# Patient Record
Sex: Male | Born: 1990 | Race: White | Hispanic: No | Marital: Single | Smoking: Never smoker
Health system: Southern US, Community
[De-identification: ages and names within clinical notes are randomized; demographics above are authoritative.]

## PROBLEM LIST (undated history)

## (undated) DIAGNOSIS — J690 Pneumonitis due to inhalation of food and vomit: Secondary | ICD-10-CM

## (undated) DIAGNOSIS — S73004A Unspecified dislocation of right hip, initial encounter: Secondary | ICD-10-CM

## (undated) DIAGNOSIS — M810 Age-related osteoporosis without current pathological fracture: Secondary | ICD-10-CM

## (undated) DIAGNOSIS — R001 Bradycardia, unspecified: Secondary | ICD-10-CM

## (undated) DIAGNOSIS — G40209 Localization-related (focal) (partial) symptomatic epilepsy and epileptic syndromes with complex partial seizures, not intractable, without status epilepticus: Secondary | ICD-10-CM

## (undated) DIAGNOSIS — D709 Neutropenia, unspecified: Principal | ICD-10-CM

## (undated) DIAGNOSIS — H5089 Other specified strabismus: Secondary | ICD-10-CM

## (undated) DIAGNOSIS — G8250 Quadriplegia, unspecified: Secondary | ICD-10-CM

## (undated) DIAGNOSIS — D649 Anemia, unspecified: Secondary | ICD-10-CM

## (undated) DIAGNOSIS — S73005A Unspecified dislocation of left hip, initial encounter: Secondary | ICD-10-CM

## (undated) DIAGNOSIS — Z9289 Personal history of other medical treatment: Secondary | ICD-10-CM

## (undated) DIAGNOSIS — N200 Calculus of kidney: Secondary | ICD-10-CM

## (undated) DIAGNOSIS — G4733 Obstructive sleep apnea (adult) (pediatric): Secondary | ICD-10-CM

## (undated) DIAGNOSIS — K219 Gastro-esophageal reflux disease without esophagitis: Secondary | ICD-10-CM

## (undated) DIAGNOSIS — Q539 Undescended testicle, unspecified: Secondary | ICD-10-CM

## (undated) DIAGNOSIS — J302 Other seasonal allergic rhinitis: Secondary | ICD-10-CM

## (undated) DIAGNOSIS — J189 Pneumonia, unspecified organism: Secondary | ICD-10-CM

## (undated) DIAGNOSIS — G40309 Generalized idiopathic epilepsy and epileptic syndromes, not intractable, without status epilepticus: Secondary | ICD-10-CM

## (undated) DIAGNOSIS — R1310 Dysphagia, unspecified: Secondary | ICD-10-CM

## (undated) DIAGNOSIS — G40909 Epilepsy, unspecified, not intractable, without status epilepticus: Secondary | ICD-10-CM

## (undated) DIAGNOSIS — M4140 Neuromuscular scoliosis, site unspecified: Secondary | ICD-10-CM

## (undated) DIAGNOSIS — G800 Spastic quadriplegic cerebral palsy: Secondary | ICD-10-CM

## (undated) DIAGNOSIS — H35109 Retinopathy of prematurity, unspecified, unspecified eye: Secondary | ICD-10-CM

## (undated) DIAGNOSIS — I615 Nontraumatic intracerebral hemorrhage, intraventricular: Secondary | ICD-10-CM

## (undated) DIAGNOSIS — S90821A Blister (nonthermal), right foot, initial encounter: Secondary | ICD-10-CM

## (undated) HISTORY — DX: Age-related osteoporosis without current pathological fracture: M81.0

## (undated) HISTORY — DX: Unspecified dislocation of right hip, initial encounter: S73.004A

## (undated) HISTORY — PX: INFUSION PUMP IMPLANTATION: SHX1824

## (undated) HISTORY — DX: Dysphagia, unspecified: R13.10

## (undated) HISTORY — DX: Other seasonal allergic rhinitis: J30.2

## (undated) HISTORY — DX: Generalized idiopathic epilepsy and epileptic syndromes, not intractable, without status epilepticus: G40.309

## (undated) HISTORY — DX: Retinopathy of prematurity, unspecified, unspecified eye: H35.109

## (undated) HISTORY — PX: RETINOPATHY OF PREMATURITY SURGERY: SHX2340

## (undated) HISTORY — DX: Pneumonia, unspecified organism: J18.9

## (undated) HISTORY — DX: Neutropenia, unspecified: D70.9

## (undated) HISTORY — DX: Localization-related (focal) (partial) symptomatic epilepsy and epileptic syndromes with complex partial seizures, not intractable, without status epilepticus: G40.209

## (undated) HISTORY — PX: INGUINAL HERNIA REPAIR: SUR1180

## (undated) HISTORY — PX: MOLE REMOVAL: SHX2046

## (undated) HISTORY — DX: Epilepsy, unspecified, not intractable, without status epilepticus: G40.909

## (undated) HISTORY — DX: Neuromuscular scoliosis, site unspecified: M41.40

## (undated) HISTORY — PX: BACK SURGERY: SHX140

## (undated) HISTORY — DX: Nontraumatic intracerebral hemorrhage, intraventricular: I61.5

## (undated) HISTORY — PX: JEJUNOSTOMY FEEDING TUBE: SUR737

## (undated) HISTORY — DX: Unspecified dislocation of right hip, initial encounter: S73.005A

## (undated) HISTORY — DX: Undescended testicle, unspecified: Q53.9

## (undated) HISTORY — DX: Spastic quadriplegic cerebral palsy: G80.0

## (undated) HISTORY — DX: Calculus of kidney: N20.0

## (undated) HISTORY — PX: EYE SURGERY: SHX253

## (undated) HISTORY — DX: Other specified strabismus: H50.89

---
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
## 2010-05-06 ENCOUNTER — Telehealth: Payer: Self-pay | Admitting: *Deleted

## 2010-05-06 NOTE — Telephone Encounter (Signed)
Please ask the mother if it would be difficult to bring her son in the office so I can assess his PEG status before I meet him just for the procedure. He may need an anti biotic or culture of the PEG site before the change. Route to [place].

## 2010-05-06 NOTE — Telephone Encounter (Signed)
PT'S MOM CALLED TO ASK IF THERE WAS A MD THAT WOULD/COULD CHANGE OUT HER SON'S BUTTON PEG TUBE. PT HAS CEREBRAL PALSY AND WITH HIS AGE, HE CONSIDERED AN ADULT; DR Wyline Mood USED TO CHANGE TUBE. DR Wyline Mood USED TO CHANGE THE TUBE; LAST TIME IT WAS CHANGED WAS 2010. PT WITH RECENT INFECTION AT THE SITE , NOW CLEARED UP, BUT IT IS LEAKING. DR [name], DOC OF THE DAY, AGREED TO DO THE REPLACEMENT , BUT SHE IS NOT IN THE HOSPITAL UNTIL THE WEEK BEGINNING 06/01/10. BUTTON, 18 FRENCH, 2.4CM LONG. INFORMED MOM, [name] THAT I CAN CHECK WITH THE  OTHER MDS FOR AN EARLIER APPT, BUT SHE WOULD RATHER WAIT FOR DR [name]. SCHEDULED PEG PLACEMENT 06/01/10 AT 0900AM IF OK DR BRODIE? ALSO, DO YOU WANT TO REPLACE THE BUTTON WITH THE SAME SIZE-18FR   2.4CM LONG? THANKS.

## 2010-05-07 ENCOUNTER — Encounter: Payer: Self-pay | Admitting: *Deleted

## 2010-05-07 NOTE — Telephone Encounter (Signed)
Spoke with pt's mom, [place] [employer] who will bring pt in to meet Dr [name] on 05/26/10 prior to Peg Button placement on 06/01/10.

## 2010-05-19 ENCOUNTER — Telehealth: Payer: Self-pay | Admitting: Internal Medicine

## 2010-05-19 NOTE — Telephone Encounter (Signed)
Left a message to call us for any questions re: appt or procedure.

## 2010-05-25 ENCOUNTER — Telehealth: Payer: Self-pay | Admitting: Internal Medicine

## 2010-05-25 NOTE — Telephone Encounter (Signed)
Patient's mother just wanted to let us know patient was treated for pneumonia last Tuesday.(New pt for [place] tomorrow) He received Rocephin IM x 6 days and Augmentin x 10 days. He has not had any fever since last Tues. He does cough but this is due to his "bad lungs".

## 2010-05-25 NOTE — Telephone Encounter (Signed)
Thanks for letting us know. He is on my schedule tomorrow.

## 2010-05-26 ENCOUNTER — Encounter: Payer: Self-pay | Admitting: Internal Medicine

## 2010-05-26 ENCOUNTER — Ambulatory Visit (INDEPENDENT_AMBULATORY_CARE_PROVIDER_SITE_OTHER): Payer: Medicaid Other | Admitting: Internal Medicine

## 2010-05-26 DIAGNOSIS — R633 Feeding difficulties: Secondary | ICD-10-CM

## 2010-05-26 DIAGNOSIS — K219 Gastro-esophageal reflux disease without esophagitis: Secondary | ICD-10-CM

## 2010-05-26 NOTE — Progress Notes (Signed)
Jeffery [name] [birth year] MRN [id number]     History of Present Illness:  This is a 20 year old white male with cerebral palsy and mental retardation, developmental mental delay and seizure disorder. He has been cared for by a guardian since the age of 4 months. He has had a percutaneous gastrostomy since 2000. It has been changed yearly by his pediatrician in 2006, 2007, 2008, 2009 and 2010. He has now switched to adult GI care. He is due to have his gastrostomy changed because it has been leaking. It is18, 2.4 cm Bard button gastrostomy. He had a swallowing study 5 years ago. He has occasional oral intake. Patient was treated for pneumonia in recent weeks.   Past Medical History  Diagnosis Date  . CP (cerebral palsy), spastic, quadriplegic   . Epilepsy   . Osteoporosis   . Kidney stones   . Inguinal hernia   . Undescended testes   . Seasonal allergies   . IVH (intraventricular hemorrhage)     Grade IV  . Hip dislocation, bilateral   . Pneumonia   . Seizures   . Sleep apnea   . Hx: UTI (urinary tract infection)    Past Surgical History  Procedure Date  . Excision of moles   . Peg placement     With multiple changes    reports that he has never smoked. He has never used smokeless tobacco. He reports that he does not drink alcohol or use illicit drugs. family history is not on file.  He is adopted. Allergies  Allergen Reactions  . Depakote         Review of Systems: Unable to give history.  The remainder of the 10  point ROS is negative except as outlined in H&P   Physical Exam: General appearance skeletal deformities. The patient is in a wheelchair. He does not respond to questions. Eyes- non icteric. HEENT nontraumatic, normocephalic. Mouth no lesions, tongue papillated, no cheilosis. Neck supple without adenopathy, thyroid not enlarged, no carotid bruits, no JVD. Lungs bibasilar rales. Cor normal S1 normal S2, regular rhythm , no murmur,  quiet  precordium. Abdomen soft with percutaneous gastrostomy and epigastrium. Skin around the gastrostomy is mildly inflamed with a small amount of drainage. There is no tenderness or distention. Extremities no pedal edema, extensive deformities. Skin no lesions. Neurological not performed. Psychological nonverbal. Appears to be relaxed and quiet.  Assessment and Plan:  Problem #1 difficulty feeding. Patient with a percutaneous gastrostomy for at least 5 years duration. He is due to have his gastrostomy changed. We will do that next week using 18 French, 2.4 cm Bard button.  Problem #2 developmental and mental abnormalities. As described above. Gastrostomy   05/26/2010 Jeffery [name]

## 2010-05-26 NOTE — Patient Instructions (Signed)
You have been scheduled for an endoscopy with Button PEG change. Please follow written instructions given to you at your visit today.

## 2010-06-01 ENCOUNTER — Ambulatory Visit (HOSPITAL_COMMUNITY)
Admission: RE | Admit: 2010-06-01 | Discharge: 2010-06-01 | Disposition: A | Discharge status: Home / Self Care | Payer: Medicaid Other | Source: Ambulatory Visit | Attending: Internal Medicine | Admitting: Internal Medicine

## 2010-06-01 ENCOUNTER — Encounter: Payer: Medicaid Other | Admitting: Internal Medicine

## 2010-06-01 DIAGNOSIS — Y833 Surgical operation with formation of external stoma as the cause of abnormal reaction of the patient, or of later complication, without mention of misadventure at the time of the procedure: Secondary | ICD-10-CM | POA: Insufficient documentation

## 2010-06-01 DIAGNOSIS — R633 Feeding difficulties: Secondary | ICD-10-CM

## 2010-06-01 DIAGNOSIS — G808 Other cerebral palsy: Secondary | ICD-10-CM | POA: Insufficient documentation

## 2010-06-01 DIAGNOSIS — K9429 Other complications of gastrostomy: Secondary | ICD-10-CM

## 2010-06-01 DIAGNOSIS — K9423 Gastrostomy malfunction: Secondary | ICD-10-CM | POA: Insufficient documentation

## 2010-06-02 ENCOUNTER — Telehealth: Payer: Self-pay | Admitting: Internal Medicine

## 2010-06-02 NOTE — Telephone Encounter (Signed)
I have already answered that question  

## 2010-06-02 NOTE — Telephone Encounter (Addendum)
Pharmacy is calling stating that they got a fax from patient's mother requesting endovev mini button. However, pharmacy has contacted the manufacturer and they only provide these buttons to physicians so they or any other pharmacy or home health agency would not be able to provide these for the patient. Was there indication to the mother that she would be able to place these herself when needed or was this a misunderstanding? Please advise

## 2010-06-03 NOTE — Telephone Encounter (Signed)
I have called and left messages for both the patient's mother and Kathlene November at [facility] that no button tubes are needed since Dr [name] just replaced the tube.

## 2010-06-03 NOTE — Telephone Encounter (Signed)
I have just replaced patient's PEG so there is no need to order a new one. I am not sure why it was ordered.-as per Dr [name]

## 2010-06-04 ENCOUNTER — Telehealth: Payer: Self-pay | Admitting: *Deleted

## 2010-06-04 ENCOUNTER — Telehealth: Payer: Self-pay | Admitting: Internal Medicine

## 2010-06-04 NOTE — Telephone Encounter (Signed)
Spoke with patient's mother and had Darcey Nora, RN spoke with mother. She will bring the adapter needed by for [place] to look at so we can assist her in finding a supplier.

## 2010-06-04 NOTE — Telephone Encounter (Signed)
Patient's mother came by office to let Darcey Nora, RN look at the feeding tube she need to use with patient's button that was placed on Monday. Sheri looked at the tubing that patient was given at the [facility] Endo unit (24 Fr right angle feeding set ref# [id number]). Sheri spoke with Endo unit at [facility] and it is not available to buy the tubing that is needed. The tubing only comes in the button kit the hospital gets. Patient's mother wants to button changed to a PEG tube that she can get the tubing needed for feedings. Please, advise.

## 2010-06-04 NOTE — Telephone Encounter (Signed)
I don't see why the PEG has to be changed. We offered standard PEG but she wanted the button. So we put in the 82F, 3 cm button last week. She received the tubing with it. Any other discussion, she ought to make an appointment or write me a letter.t.

## 2010-06-05 NOTE — Telephone Encounter (Signed)
LMTCB

## 2010-06-09 NOTE — Telephone Encounter (Signed)
Spoke with patient's mother. She wants Dr. [name] to know she feels it is unsafe for patient with this devise because she has only one set of tubing. His school is using the decompression tube to feed him. She has the feeding tube at home. If she needs to decompress his stomach, she cannot because the school has that tubing. She is worried he will aspirate. Scheduled a visit on 06/25/10 at 3:00 PM with Dr. [name] to discuss.

## 2010-06-09 NOTE — Telephone Encounter (Signed)
Left message for patient's mother to call me.

## 2010-06-09 NOTE — Telephone Encounter (Signed)
He does not need to be seen by me to discuss purchasing another connecting tube. Ususally the connecting tube travels with the pt. To school and back.  f  Not possible , we will prescribe another extension tube but she may have to pay for the entire PEG set since it all comes together.

## 2010-06-10 NOTE — Telephone Encounter (Signed)
[employer] and they do not sell to anyone but those with hospital accounts. Suggested I call my purchasing department. Called Misty Stanley at [facility] purchasing and she gave me a number to call [employer]- with [name] [phone]. Spoke with [employer] and gave her the ref number on the kit that was used. She will work on finding out how to get tubing and will call me back sometime today. Called and let patient's mother know we are trying to find the tubing she needs.

## 2010-06-12 NOTE — Telephone Encounter (Signed)
Spoke with Dan Europe of [employer], JPr Ren Young the rep for [employer] the part number for the bolus feeding set is [id number] [phone] and #10 come in a case at $150. Spoke with hospital purchasing and they would need a cost center number to order. Spoke with [employer] patient's mother and she is going to see if the Company she was using will be able to order the set now that we have a part number. She will call me on Tues (06/16/10) with an update.

## 2010-06-14 NOTE — Consult Note (Signed)
  Jeffery [name], Jeffery [name]                 ACCOUNT NO.:  [id number]  MEDICAL RECORD NO.:  [id number]           PATIENT TYPE:  O  LOCATION:  WLEN                         FACILITY:  [facility]  PHYSICIAN:  Hedwig Morton. [name], MD     DATE OF BIRTH:  [birth year]  DATE OF CONSULTATION: DATE OF DISCHARGE:                                CONSULTATION   NAME OF PROCEDURE:  Change of gastrostomy.  INDICATIONS:  This 20 year old white male with cerebral palsy has a percutaneous gastrostomy for nutritional support.  It has been leaking around.  The last change of the gastrostomy occurred 2 years ago.  The existing gastrostomy is a Bard 18-French 2.4 cm gastrostomy.  He is scheduled for change of the gastrostomy.  SEDATION:  Normal.  FINDINGS:  The existing gastrostomy is located in epigastrium to the left of the midline.  Skin around the gastrostomy is macerated, red and scaly.  The existing gastrostomy was removed by gentle traction and through the existing track in the abdominal wall, a new gastrostomy was placed which is a Environmental manager 20-French 3-cm long gastrostomy.  It was placed with only gentle pressure.  The position and the patency of the gastrostomy was checked by gravity feeding using water.  The balloon at the gastrostomy was insufflated with 8 cc of sterile water.  The patient tolerated the procedure well.  IMPRESSION:  Change of percutaneous gastrostomy to 20-French, 3-cm long [employer]  PLAN: 1. The patient will resume tube feedings right away. 2. Gastrostomy ought to be changed in 1 year. 3. Continue skin care and antireflux measures.     Hedwig Morton. [name], MD     DMB/MEDQ  D:  06/01/2010  T:  06/01/2010  Job:  [id number]  Electronically Signed by [name] MD on 06/14/2010 10:01:39 PM

## 2010-06-16 NOTE — Op Note (Signed)
[name], Jeffery NO.:  [id number]   MEDICAL RECORD NO.:  [id number]          PATIENT TYPE:  AMB   LOCATION:  ENDO                         FACILITY:  [facility]   PHYSICIAN:  Bunnie Pion, MD   DATE OF BIRTH:  [birth year]   DATE OF PROCEDURE:  07/18/2006  DATE OF DISCHARGE:                               OPERATIVE REPORT   PREOPERATIVE DIAGNOSIS:  Need for enteral access.   POSTOPERATIVE DIAGNOSIS:  Need for enteral access.   OPERATION PERFORMED:  Change of Bard gastrostomy tube.   DESCRIPTION OF PROCEDURE:  The procedure was done in the endoscopy  suite.  After placement of EMLA cream on the site, the previously placed  but malfunctioning Bard button was removed in the standard fashion.  It  was notable that there was impacted material within the dome of button  and this probably contributed to the failure.   The new appropriately sized button was placed using the obturator  without difficulty.  It functioned properly.  The patient tolerated the  procedure.  He was returned to the care of his mother.      Bunnie Pion, MD  Electronically Signed     TMW/MEDQ  D:  07/19/2006  T:  07/19/2006  Job:  [id number]

## 2010-06-16 NOTE — Op Note (Signed)
NAMEALIEU, FINNIGAN NO.:  [id number]   MEDICAL RECORD NO.:  [id number]          PATIENT TYPE:  AMB   LOCATION:  ENDO                         FACILITY:  [facility]   PHYSICIAN:  Bunnie Pion, MD   DATE OF BIRTH:  09/20/1990   DATE OF PROCEDURE:  06/24/2008  DATE OF DISCHARGE:  06/24/2008                               OPERATIVE REPORT   PREOPERATIVE DIAGNOSIS:  Need for enteral access.   POSTOPERATIVE DIAGNOSIS:  Need for enteral access.   OPERATION PERFORMED:  Change of Bard button.   DESCRIPTION OF PROCEDURE:  The procedure was done in the endoscopy  suite.  After local anesthesia was obtained with EMLA cream, the 18-  French 2.4 Bard button was exchanged in the standard fashion.  The  patient tolerated the procedure well.  He was released to the care of  his mother.      Bunnie Pion, MD  Electronically Signed     TMW/MEDQ  D:  06/24/2008  T:  06/25/2008  Job:  [id number]

## 2010-06-16 NOTE — Op Note (Signed)
Jeffery [name], Jeffery [name]                 ACCOUNT NO.:  [id number]   MEDICAL RECORD NO.:  [id number]          PATIENT TYPE:  AMB   LOCATION:  SDS                          FACILITY:  [facility]   PHYSICIAN:  Bunnie Pion, MD   DATE OF BIRTH:  1990/09/20   DATE OF PROCEDURE:  07/28/2007  DATE OF DISCHARGE:                               OPERATIVE REPORT   PREOPERATIVE DIAGNOSES:  1. Neurodevelopmental delay.  2. Left hip nevus.   POSTOPERATIVE DIAGNOSES:  1. Neurodevelopmental delay.  2. Left hip nevus.   OPERATION PERFORMED:  1. Change of gastrostomy tube to 18-French 2.4-cm device.  2. Excision of left hip nevus.   DESCRIPTION OF PROCEDURE:  After identifying the patient, he was placed  in supine position upon the operating room table.  When adequate level  of anesthesia had been safely obtained, the Bard button was changed in a  standard fashion.  There were no immediate complications.   Attention was now turned to the left hip and leg where the nevus of  concern was identified.  Lidocaine 1% and 4% Marcaine were injected for  local anesthesia.   The site was prepped and draped.  A lenticular incision was made around  the lesion of concern with a 1-2 mm margin.  Dissection was carried down  carefully with electrocautery.  The specimen was excised and passed off  the field for pathologic analysis.   The wound was irrigated and the site was closed with interrupted 4-0  Monocryl suture.  Dermabond was applied.  The patient was awakened in  the operating room and returned to recovery room in stable condition.      Bunnie Pion, MD  Electronically Signed     TMW/MEDQ  D:  07/28/2007  T:  07/29/2007  Job:  [id number]

## 2010-06-16 NOTE — Telephone Encounter (Signed)
Patient's mother called. Her pharmacy is able to get the bolus feeding set. Will need an order faxed to [phone]. Order faxed.

## 2010-06-16 NOTE — Op Note (Signed)
NAMEJOSHUWA, Jeffery [name] NO.:  [id number]   MEDICAL RECORD NO.:  [id number]          PATIENT TYPE:  AMB   LOCATION:  ENDO                         FACILITY:  [facility]   PHYSICIAN:  Bunnie Pion, MD   DATE OF BIRTH:  December 22, 1990   DATE OF PROCEDURE:  10/23/2007  DATE OF DISCHARGE:                               OPERATIVE REPORT   PREOPERATIVE DIAGNOSIS:  Gastrostomy tube.   POSTOPERATIVE DIAGNOSIS:  Gastrostomy tube.   OPERATION PERFORMED:  Exchange of gastrostomy tube.   ATTENDING SURGEON:  Bunnie Pion, MD   DESCRIPTION OF PROCEDURE:  The procedure was done in the endoscopy  suite.  After adequate sedation had been provided, the previously placed  Bard button was removed in the standard fashion.  This was replaced with  a similar sized button without difficulty.  The patient tolerated the  procedure.  He was turned to the care of the nurses and discharged to  home shortly thereafter.      Bunnie Pion, MD  Electronically Signed     TMW/MEDQ  D:  10/24/2007  T:  10/24/2007  Job:  [id number]

## 2010-06-19 NOTE — Op Note (Signed)
NAMEALVIS, EDGELL                 ACCOUNT NO.:  [id number]   MEDICAL RECORD NO.:  [id number]          PATIENT TYPE:  AMB   LOCATION:  ENDO                         FACILITY:  [facility]   PHYSICIAN:  Prabhakar D. Pendse, M.D.DATE OF BIRTH:  29-Apr-1990   DATE OF PROCEDURE:  03/20/2004  DATE OF DISCHARGE:                                 OPERATIVE REPORT   PREOPERATIVE DIAGNOSES:  1.  Fracture, gastrostomy button.  2.  Past history of cerebral palsy, spastic quadriplegia and epilepsy.   POSTOPERATIVE DIAGNOSES:  1.  Fracture, gastrostomy button.  2.  Past history of cerebral palsy, spastic quadriplegia and epilepsy.   OPERATION PERFORMED:  1.  Removal of fractured gastrostomy button.  2.  Placement of new Bard #18 French 2.4 cm button.   SURGEON:  Prabhakar D. Levie Heritage, M.D.   ASSISTANT:  Nurse.   ANESTHESIA:  Topical __________ cream and rectal Valium.   DESCRIPTION OF OPERATION:  Under satisfactory sedation and topical  __________ cream anesthesia the gastrostomy site was cleansed.  The  previously placed fractured gastrostomy button was removed with  manipulation.  The area was cleansed.  A new #18, 2.4 cm Bard button was  placed with manipulation and irrigated with saline.  No leakage was noted.  Appropriate dressing was applied.   Instructions were given to the mother regarding the care of the button.   The patient was discharged to be followed as an outpatient.      PDP/MEDQ  D:  03/20/2004  T:  03/21/2004  Job:  [id number]

## 2010-06-19 NOTE — Op Note (Signed)
NAMEHINTON, LUELLEN                 ACCOUNT NO.:  [id number]   MEDICAL RECORD NO.:  [id number]          PATIENT TYPE:  AMB   LOCATION:  SDS                          FACILITY:  [facility]   PHYSICIAN:  Prabhakar D. Pendse, M.D.DATE OF BIRTH:  [birth year]   DATE OF PROCEDURE:  10/09/2004  DATE OF DISCHARGE:                                 OPERATIVE REPORT   PREOPERATIVE DIAGNOSES:  1.  Multiple pigmented moles of right buttock, left shoulder and left flank.  2.  Cerebral palsy with multiple chronic problems.   POSTOPERATIVE DIAGNOSES:  1.  Multiple pigmented moles of right buttock, left shoulder and left flank.  2.  Cerebral palsy with multiple chronic problems.   OPERATION PERFORMED:  Excision of multiple pigmented moles of right buttock,  left shoulder and left flank.   SURGEON:  Prabhakar D. Levie Heritage, M.D.   ASSISTANT:  Nurse.   ANESTHESIA:  Nurse.   OPERATIVE PROCEDURE:  Under satisfactory general mask anesthesia, in right  lateral position, left shoulder and left flank regions were thoroughly  prepped and draped in the usual manner.  A circular incision was made around  the pigmented moles with a 1- to 2-mm margin.  0.25% Marcaine with  epinephrine was injected locally.  The moles were excised by sharp  dissection with full-thickness skin, the hemostasis accomplished by  electrocautery, Neosporin dressing applied.  Note that both these mole areas  were not sutured.   The patient's general condition being satisfactory, the patient was turned  towards the left lateral position.  The right buttock region was thoroughly  prepped and draped in the usual manner.  About a 3-cm-long and 1.5-cm-wide  elliptical incision was made around the pigmented mole of the right buttock;  a full-thickness segment bearing the mole with a few-millimeter margin was  excised with hemostasis accomplished, deeper layers approximated with 4-0  Vicryl, skin closed with 4-0 nylon interrupted as well as  running  interlocking sutures, occlusive dressing applied.  Throughout the procedure,  the patient's vital signs remained stable.  The patient withstood the  procedure well and was transferred to the recovery room in satisfactory  general condition.           ______________________________  Hyman Bible Levie Heritage, M.D.     PDP/MEDQ  D:  10/09/2004  T:  10/09/2004  Job:  [id number]   cc:   [facility] [address], [place], [place] [address] Koren Shiver MD

## 2010-06-19 NOTE — Op Note (Signed)
NAMEBLAYNE, FRANKIE NO.:  [id number]   MEDICAL RECORD NO.:  [id number]          PATIENT TYPE:  AMB   LOCATION:  ENDO                         FACILITY:  [facility]   PHYSICIAN:  Bunnie Pion, MD   DATE OF BIRTH:  1990/07/03   DATE OF PROCEDURE:  06/21/2006  DATE OF DISCHARGE:                               OPERATIVE REPORT   PREOPERATIVE DIAGNOSIS:  Need for enteral feeding access.   POSTOPERATIVE DIAGNOSIS:  Need for enteral feeding access.   OPERATION PERFORMED:  Change of gastrostomy tube, 18 French, 2.4 cm Bard  button.   DESCRIPTION OF PROCEDURE:  The procedure is done in the endoscopy suite.  After the patient received sedating medication, EMLA cream was placed on  the Bard button.  This was removed in the standard fashion using the  obturator.  An 55 [place], 2.4 button was replaced in the tract without  difficulty.  A bedside gravity test demonstrated good function.  The  patient tolerated the procedure and was returned to the care of his  mother.      Bunnie Pion, MD  Electronically Signed     TMW/MEDQ  D:  06/21/2006  T:  06/21/2006  Job:  [id number]

## 2010-06-19 NOTE — Discharge Summary (Signed)
Jeffery [name], PINK NO.:  [id number]   MEDICAL RECORD NO.:  [id number]          PATIENT TYPE:  INP   LOCATION:  6123                         FACILITY:  [facility]   PHYSICIAN:  Lupita Raider, M.D.   DATE OF BIRTH:  1990/02/21   DATE OF ADMISSION:  09/10/2005  DATE OF DISCHARGE:  09/12/2005                                 DISCHARGE SUMMARY   PRIMARY CARE PHYSICIAN:  Dr. Koren Shiver of [facility],  telephone number [phone].   NEUROLOGIST:  Dr. [name] of [facility].   REASON FOR HOSPITALIZATION:  Fever and coffee ground emesis from G-tube.   SIGNIFICANT FINDINGS:  This is a 20 year old white male with MRCP and a  complex medical history, admitted with fever (105) and gastrooccult positive  G-tube fluid.  Has history of gastritis and coffee ground emesis in the G-  tube, for which he takes a PPI.  Found to have right lower lobe infiltrate  on chest x-ray and a KUB full of stool.  He was started on IV ceftriaxone  for presumed streph pneumonia and grew gram positive cocci in pairs from  September 10, 2005, blood cultures - final speciation pending.  Vital signs  were stable and he was afebrile throughout admission.  Fleets enema x1 with  much stool.  On day of discharge, he was tolerating his home feeds, afebrile  and at his baseline pulse ox.   TREATMENTS:  Ceftriaxone 1 gram IV q.24 hours times a 48 hour course and a  Fleets enema x1 with multiple stools.   OPERATIONS AND PROCEDURES:  None.   FINAL DIAGNOSES:  1. Right lower lobe pneumonia - likely streph pneumo.  2. Gastroenteritis.  3. Fever - resolved.  4. MRCP.  5. Seizure disorder.  6. Osteoporosis.  7. Kidney stones.  8. Right bilateral inguinal hernias.  9. Undescended testes bilaterally.  10.Seasonal allergies.  11.Grade IV IVH.  12.Bilateral hip dislocation.   DISCHARGE MEDICATIONS AND INSTRUCTIONS:  1. Augmentin ES 607 ml b.i.d. x8 days via G-tube.  2. Topamax  100 mg via G-tube b.i.d.  3. Neurontin 600 mg via G-tube t.i.d.  4. Calcium suspension 4 cc via G-tube b.i.d.  5. Centrum vitamins 5 cc via G-tube daily.  6. Claritin 7.5 cc via G-tube daily.  7. Prevacid 30 mg via G-tube daily.  8. MiraLax 17 grams via G-tube p.r.n. constipation.  9  Baclofen pump 817.7 grams per 24 hours.  1. Botox injections q.4 months (last injection September 03, 2005).  2. Diastat 10 mg p.r.n. seizures.   PENDING RESULTS/ISSUES TO BE FOLLOWED:  September 10, 2005 blood cultures that  showed preliminary gram positive cocci in pairs.  Speciation to follow.   FOLLOWUP:  Dr. Anner Crete and Dr. [name] p.r.n.   DISCHARGE WEIGHT:  30.87 kilograms.   DISCHARGE CONDITION:  Stable.   The handwritten discharge summary was faxed to Dr. Koren Shiver.           ______________________________  Lupita Raider, M.D.     KS/MEDQ  D:  09/12/2005  T:  09/12/2005  Job:  [id number]  cc:   Koren Shiver, M.D.  Deanna Artis. [name], M.D.

## 2010-06-19 NOTE — Op Note (Signed)
Jeffery [name], Jeffery [name]                 ACCOUNT NO.:  [id number]   MEDICAL RECORD NO.:  [id number]          PATIENT TYPE:  AMB   LOCATION:  SDS                          FACILITY:  [facility]   PHYSICIAN:  Prabhakar D. Pendse, M.D.DATE OF BIRTH:  Jun 14, 1990   DATE OF PROCEDURE:  11/16/2005  DATE OF DISCHARGE:                                 OPERATIVE REPORT   PREOPERATIVE DIAGNOSES:  1. Three pigmented moles:  Right scalp, right neck, and left leg.  2. Nonfunctioning gastrostomy button.  3. History of cerebral palsy and seizure disorder.   POSTOPERATIVE DIAGNOSIS:  1. Three pigmented moles:  Right scalp, right neck, and left leg.  2. Nonfunctioning gastrostomy button.  3. History of cerebral palsy and seizure disorder.   OPERATION PERFORMED:  1. Excision of 3 moles:  Right scalp, right neck, and left leg; excision      margins 2.5 cm x 1 cm each, and a simple repair.  2. Change of gastrostomy button; placement of Bard #18, 2.4-cm stem      button.   SURGEON:  Prabhakar D. Levie Heritage, M.D.   ASSISTANT:  Nurse.   ANESTHESIA:  Nurse.   OPERATIVE PROCEDURE:  Under satisfactory general endotracheal anesthesia  with the patient in supine position, the right scalp, right neck, left leg,  and gastrostomy regions were thoroughly prepped and draped in the usual  manner.  Regarding the excision of the moles:  An elliptical incision  measuring 2.5 cm long and 1 cm in width was made around each of these moles.  The skin and subcutaneous tissue incised.  Bleeders individually clamped,  cut, and electrocoagulated.  Then 1/4% Marcaine with epinephrine was  injected locally for hemostasis as well as postop analgesia. By sharp and  blunt dissection each mole was excised; and the repairs were carried out in  the following manner:  The right scalp mole repaired with 4-0 silk  interrupted sutures; the right neck with 5-0 Monocryl subcuticular sutures;  and left leg with 5-0 nylon running interlocking  sutures.  At this time  appropriate dressings were applied to all these three areas.   The gastrostomy site was exposed; and the previously placed nonfunctioning  gastrostomy button was removed; and the new #18-French Bard button with a  2.4-cm stem was placed with manipulation.  The button was irrigated with 50  mL of saline.  There was no leakage noted.  No other abnormal problems  noted; appropriate dressing applied.  Throughout the procedure the patient's vital signs remained stable.  The  patient withstood the procedure well; and was transferred to recovery room  in satisfactory general condition.  report to Dr. for of the valve down to  the LS address medical parts revealed in three, the large the EEA the  muscular line nurse.  At this to a 8712 end of the case           ______________________________  Hyman Bible. Levie Heritage, M.D.     PDP/MEDQ  D:  11/16/2005  T:  11/17/2005  Job:  [id number]   cc:   Layne Benton  [place]  [id number]  Dr. Koren Shiver, Medical [facility] 3,

## 2010-06-25 ENCOUNTER — Ambulatory Visit: Payer: Medicaid Other | Admitting: Internal Medicine

## 2010-06-26 ENCOUNTER — Telehealth: Payer: Self-pay | Admitting: Internal Medicine

## 2010-06-26 NOTE — Telephone Encounter (Signed)
Patient's mother calling to report that the feeding tube has serous drainage around it that is irritating his skin. She has used Bactroban around the tube in the past and wonders if she can have an rx for this. Also, wanted to let Dr. [name] know that last week, the nurse at school put 10cc water in the balloon, yesterday it had 8 cc's in it. Pharmacy [facility] [phone]. Please, advise.

## 2010-06-27 NOTE — Telephone Encounter (Signed)
There is no Procedure report under Procedures, wrong sheet was scanned in. There is a dictated Procedure note from 06/03/2010 in e-chart. Please obtain that. OK to use  Topical medication by the care taker, whatever worked for her. She is not supposed to be checking how much fluid there is left  in the retention balloon. If she does it very often, she will wear out the balloon. It is never possible to recover all the fluid  From the balloon.

## 2010-06-30 ENCOUNTER — Telehealth: Payer: Self-pay | Admitting: *Deleted

## 2010-06-30 MED ORDER — MUPIROCIN CALCIUM 2 % EX CREA: TOPICAL_CREAM | CUTANEOUS | Status: AC

## 2010-06-30 NOTE — Telephone Encounter (Signed)
Reviewed

## 2010-06-30 NOTE — Telephone Encounter (Signed)
Consult note sent to be scanned into chart. Spoke with patient's mother. She states the male nurse at endo told her to check the fluid amount in the balloon weekly but she states she will stop doing this as per Dr. [name]. Bactroban rx sent as requested.

## 2010-06-30 NOTE — Telephone Encounter (Signed)
Spoke with Delice Bison at [employer] (phone [phone]) and got patient's bactroban 2 % cream covered x 1 year (there is no generic availability for the cream).

## 2010-07-06 ENCOUNTER — Ambulatory Visit: Payer: Medicaid Other | Admitting: Internal Medicine

## 2010-10-29 LAB — CBC
HCT: 48.3
Hemoglobin: 16.3 — ABNORMAL HIGH
MCHC: 33.8
MCV: 93.6
Platelets: 159
RBC: 5.17
WBC: 6.4

## 2010-10-29 LAB — BASIC METABOLIC PANEL
Calcium: 9.5
Creatinine, Ser: 0.66
Glucose, Bld: 95
Potassium: 4.1

## 2010-11-27 ENCOUNTER — Telehealth: Payer: Self-pay | Admitting: Internal Medicine

## 2010-11-27 DIAGNOSIS — K9423 Gastrostomy malfunction: Secondary | ICD-10-CM

## 2010-11-27 NOTE — Telephone Encounter (Signed)
It will have to be a Button gastrostomy 80F,3 cm long.

## 2010-11-27 NOTE — Telephone Encounter (Signed)
Spoke with [name]. She states she was lining up the black to black writing on the button to feed pt. The white disc that inside this piece that closes up the tube came out. She got it back but has to put a band aid over the flap to keep it closed. States the patient will need to have a new feeding tube placed because this will not hold up long. The tube is functioning currently but she states it will not stay on long.

## 2010-11-27 NOTE — Telephone Encounter (Signed)
Please schedule for change of gastrostomy, please call [facility] to order the replacement gastrostomy.

## 2010-11-30 NOTE — Telephone Encounter (Signed)
Jeffery [name] left a message that they are ordering the Tomma Lightning but it may not get here this week due to weather conditions in [place]. Spoke with [name] and told her we will schedule the replacement as soon as the button comes in.

## 2010-11-30 NOTE — Telephone Encounter (Signed)
Spoke with [facility] endo. She is checking on getting the Button gastrostomy 20 French 3 cm long and will call me back.

## 2010-11-30 NOTE — Telephone Encounter (Signed)
Somebody else can do it.

## 2010-11-30 NOTE — Telephone Encounter (Signed)
Dr. [name], Do you want to do this procedure on your hospital week or have someone else do this ASAP?

## 2010-12-08 NOTE — Telephone Encounter (Signed)
Spoke with Dawn at [facility] endo and button has arrived. She states next Monday or Tuesday would be good time to do procedure. Spoke with Dr. Leone Payor and he will do this exchange since he is hospital MD. Sherron Monday with Noreene Larsson at [facility] endo and scheduled patient on 12/15/10 9:00/9:30 AM. NPO after midnight. Booking number [phone]. [name] aware of date and instructions.

## 2010-12-08 NOTE — Telephone Encounter (Signed)
Note sent to Dr. Leone Payor also with appointment

## 2010-12-14 ENCOUNTER — Encounter (HOSPITAL_COMMUNITY): Payer: Self-pay

## 2010-12-15 ENCOUNTER — Encounter (HOSPITAL_COMMUNITY): Payer: Self-pay | Admitting: Internal Medicine

## 2010-12-15 ENCOUNTER — Encounter (HOSPITAL_COMMUNITY)
Admission: RE | Disposition: A | Discharge status: Home / Self Care | Payer: Self-pay | Source: Ambulatory Visit | Attending: Internal Medicine

## 2010-12-15 ENCOUNTER — Ambulatory Visit (HOSPITAL_COMMUNITY)
Admission: RE | Admit: 2010-12-15 | Discharge: 2010-12-15 | Disposition: A | Discharge status: Home / Self Care | Payer: Medicaid Other | Source: Ambulatory Visit | Attending: Internal Medicine | Admitting: Internal Medicine

## 2010-12-15 DIAGNOSIS — R633 Feeding difficulties, unspecified: Secondary | ICD-10-CM | POA: Insufficient documentation

## 2010-12-15 DIAGNOSIS — M810 Age-related osteoporosis without current pathological fracture: Secondary | ICD-10-CM | POA: Insufficient documentation

## 2010-12-15 DIAGNOSIS — Y831 Surgical operation with implant of artificial internal device as the cause of abnormal reaction of the patient, or of later complication, without mention of misadventure at the time of the procedure: Secondary | ICD-10-CM | POA: Insufficient documentation

## 2010-12-15 DIAGNOSIS — G809 Cerebral palsy, unspecified: Secondary | ICD-10-CM | POA: Insufficient documentation

## 2010-12-15 DIAGNOSIS — K9423 Gastrostomy malfunction: Secondary | ICD-10-CM | POA: Insufficient documentation

## 2010-12-15 DIAGNOSIS — G4730 Sleep apnea, unspecified: Secondary | ICD-10-CM | POA: Insufficient documentation

## 2010-12-15 HISTORY — PX: BUTTON CHANGE: SHX5423

## 2010-12-15 SURGERY — REPLACEMENT, BUTTON GASTROSTOMY DEVICE
Anesthesia: LOCAL

## 2010-12-15 NOTE — Procedures (Signed)
3 cm Button tube that was malfunctioning was removed and a new one placed without difficulty.

## 2010-12-15 NOTE — H&P (Signed)
  20 yo wm with CP here for gastrostomy change due to deterioration of tube.   Allergies  Allergen Reactions  . Depakote    Past Medical History  Diagnosis Date  . CP (cerebral palsy), spastic, quadriplegic   . Epilepsy   . Osteoporosis   . Kidney stones   . Inguinal hernia   . Undescended testes   . Seasonal allergies   . IVH (intraventricular hemorrhage)     Grade IV  . Hip dislocation, bilateral   . Pneumonia   . Seizures   . Sleep apnea   . Hx: UTI (urinary tract infection)    Past Surgical History  Procedure Date  . Excision of moles   . Peg placement     With multiple changes   History   Social History  . Marital Status: Single    Spouse Name: N/A    Number of Children: N/A  . Years of Education: N/A   Occupational History  . Student     Social History Main Topics  . Smoking status: Never Smoker   . Smokeless tobacco: Never Used  . Alcohol Use: No  . Drug Use: No  . Sexually Active: Not on file   Other Topics Concern  . Not on file   Social History Narrative  . No narrative on file   Family History  Problem Relation Age of Onset  . Adopted: Yes   BP 115/78 t 97.5 p 64 r 16  In wheel chair NAD See pre-sedation form

## 2010-12-16 ENCOUNTER — Encounter (HOSPITAL_COMMUNITY): Payer: Self-pay

## 2010-12-16 NOTE — Procedures (Signed)
Timeout performed at 1015. Tube removed and replaced by Dr. Leone Payor with low profile 44fr x 3  Cm gastrostotomy feeding tube.Pt disicharged via wc at 1025

## 2010-12-28 ENCOUNTER — Encounter (HOSPITAL_COMMUNITY): Payer: Self-pay | Admitting: Internal Medicine

## 2011-07-01 ENCOUNTER — Other Ambulatory Visit (HOSPITAL_COMMUNITY): Payer: Self-pay | Admitting: Pediatrics

## 2011-07-01 DIAGNOSIS — R569 Unspecified convulsions: Secondary | ICD-10-CM

## 2011-07-05 ENCOUNTER — Ambulatory Visit (HOSPITAL_COMMUNITY)
Admission: RE | Admit: 2011-07-05 | Discharge: 2011-07-05 | Disposition: A | Discharge status: Home / Self Care | Payer: Medicaid Other | Source: Ambulatory Visit | Attending: Pediatrics | Admitting: Pediatrics

## 2011-07-05 DIAGNOSIS — R569 Unspecified convulsions: Secondary | ICD-10-CM

## 2011-07-05 DIAGNOSIS — G40209 Localization-related (focal) (partial) symptomatic epilepsy and epileptic syndromes with complex partial seizures, not intractable, without status epilepticus: Secondary | ICD-10-CM | POA: Insufficient documentation

## 2011-07-05 DIAGNOSIS — G40309 Generalized idiopathic epilepsy and epileptic syndromes, not intractable, without status epilepticus: Secondary | ICD-10-CM | POA: Insufficient documentation

## 2011-07-05 DIAGNOSIS — G808 Other cerebral palsy: Secondary | ICD-10-CM | POA: Insufficient documentation

## 2011-07-05 DIAGNOSIS — R9401 Abnormal electroencephalogram [EEG]: Secondary | ICD-10-CM | POA: Insufficient documentation

## 2011-07-05 NOTE — Progress Notes (Signed)
OP EEG completed

## 2011-07-05 NOTE — Procedures (Signed)
EEG NUMBER:  [phone] [id number].  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  He has had 7 seizures since February 2013, partial onset with secondary generalization.  The patient has had pneumonia on 2 of the occasions when seizures took place.  The study is being done to look for the presence of seizure disorder (345.40, 345.10, 343.3).  MEDICATIONS:  Include Keppra, Neurontin, calcium, Sentra, vitamins, zinc, vitamin C, MiraLAX, Prevacid, Proventil, albuterol, Zyderm, baclofen pump, Diastat, Botox.  RECORDING TIME:  22-1/2 minutes.  DESCRIPTION OF FINDINGS:  Background activity is a monotonous 2-3 Hz, 20- 40 microvolt delta range activity that is broadly distributed throughout the entire record.  There was no focal slowing.  There was no interictal epileptiform activity in the form of spikes or sharp waves.  Photic stimulation induced a driving response at 6 and 9 Hz.  EKG showed a sinus rhythm with ventricular response of 81 beats per minute.  IMPRESSION:  Abnormal EEG on the basis of diffuse background slowing. 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  It maybe reflect also postictal state.  No seizure Activity was seen in the record.  Deanna Artis. [name], M.D.    WGN:FAOZ D:  07/05/2011 13:16:18  T:  07/05/2011 19:00:30  Job #:  [id number]

## 2011-09-13 ENCOUNTER — Telehealth: Payer: Self-pay | Admitting: Internal Medicine

## 2011-09-13 DIAGNOSIS — K9423 Gastrostomy malfunction: Secondary | ICD-10-CM

## 2011-09-13 NOTE — Telephone Encounter (Signed)
The patient's mother called and reported the button feeding tube has a broken piece on it.  She reports that there is an internal plastic piece that keeps coming out.  She reports the tube is functional to feed him, but will need to be replaced very soon.  Dr. [name] please advise

## 2011-09-13 NOTE — Telephone Encounter (Signed)
Left message for patient to call back  

## 2011-09-13 NOTE — Telephone Encounter (Signed)
The earliest I can change it is Sept 5, Thursday  between 10.00 am and 11.30 am. We need to order 96F 2.4 cm button gastrostomy

## 2011-09-14 ENCOUNTER — Other Ambulatory Visit: Payer: Self-pay | Admitting: *Deleted

## 2011-09-14 DIAGNOSIS — K9423 Gastrostomy malfunction: Secondary | ICD-10-CM

## 2011-09-14 NOTE — Telephone Encounter (Signed)
Scheduled PEG tube replacement on 10/07/11 at 10:00 AM [facility] endo Noreene Larsson) Booking number [phone]. Left a message for mother to call me.

## 2011-09-15 NOTE — Telephone Encounter (Signed)
Spoke with patient's mother and gave her date/time of procedure. NPO after midnight.

## 2011-09-15 NOTE — Telephone Encounter (Signed)
Left a message for patient's mother to call me. 

## 2011-09-16 ENCOUNTER — Telehealth: Payer: Self-pay | Admitting: *Deleted

## 2011-09-16 NOTE — Telephone Encounter (Signed)
2.5 cm button, please DB ----- Message ----- From: Daphine Deutscher, RN Sent: 09/15/2011 1:12 PM To: Hart Carwin, MD Dr. [name], Noreene Larsson from [facility] endo called about the button on this patient. The 2.4 cm is no longer available. It comes in 2.3 or 2.5 cm now. Which would you like? Maryjo Rochester notified of above recommendation.

## 2011-10-07 ENCOUNTER — Encounter (HOSPITAL_COMMUNITY)
Admission: RE | Disposition: A | Discharge status: Home / Self Care | Payer: Self-pay | Source: Ambulatory Visit | Attending: Internal Medicine

## 2011-10-07 ENCOUNTER — Ambulatory Visit (HOSPITAL_COMMUNITY)
Admission: RE | Admit: 2011-10-07 | Discharge: 2011-10-07 | Disposition: A | Discharge status: Home / Self Care | Payer: Medicaid Other | Source: Ambulatory Visit | Attending: Internal Medicine | Admitting: Internal Medicine

## 2011-10-07 DIAGNOSIS — R633 Feeding difficulties, unspecified: Secondary | ICD-10-CM

## 2011-10-07 DIAGNOSIS — G808 Other cerebral palsy: Secondary | ICD-10-CM | POA: Insufficient documentation

## 2011-10-07 DIAGNOSIS — K9423 Gastrostomy malfunction: Secondary | ICD-10-CM | POA: Insufficient documentation

## 2011-10-07 DIAGNOSIS — Y833 Surgical operation with formation of external stoma as the cause of abnormal reaction of the patient, or of later complication, without mention of misadventure at the time of the procedure: Secondary | ICD-10-CM | POA: Insufficient documentation

## 2011-10-07 DIAGNOSIS — G4730 Sleep apnea, unspecified: Secondary | ICD-10-CM | POA: Insufficient documentation

## 2011-10-07 DIAGNOSIS — M810 Age-related osteoporosis without current pathological fracture: Secondary | ICD-10-CM | POA: Insufficient documentation

## 2011-10-07 HISTORY — PX: PEG PLACEMENT: SHX5437

## 2011-10-07 SURGERY — REPLACEMENT, PEG TUBE, WITHOUT ENDOSCOPY
Anesthesia: Moderate Sedation

## 2011-10-07 NOTE — H&P (Signed)
  Jeffery [name] 09-Apr-1990 MRN [id number]        History of Present Illness:  This is a 21 yo wm with CP  With permanent PEG which needs to be changed. Last change 09/2010 21F 2.4 cm Button   Past Medical History  Diagnosis Date  . CP (cerebral palsy), spastic, quadriplegic   . Epilepsy   . Osteoporosis   . Kidney stones   . Inguinal hernia   . Undescended testes   . Seasonal allergies   . IVH (intraventricular hemorrhage)     Grade IV  . Hip dislocation, bilateral   . Pneumonia   . Seizures   . Sleep apnea   . Hx: UTI (urinary tract infection)    Past Surgical History  Procedure Date  . Excision of moles   . Peg placement     With multiple changes  . Gastrostomy tube, change / reposition 12/15/2010       . Button change 12/15/2010    Procedure: BUTTON CHANGE;  Surgeon: Jeffery Boop, MD;  Location: [facility];  Service: Endoscopy;  Laterality: N/A;    reports that he has never smoked. He has never used smokeless tobacco. He reports that he does not drink alcohol or use illicit drugs. family history is not on file.  He is adopted. Allergies  Allergen Reactions  . Divalproex Sodium         Review of Systems:  The remainder of the 10 point ROS is negative except as outlined in H&P   Physical Exam: General appearance  , in no distress. Eyes- non icteric.Marland Kitchen Mouth no lesions, tongue papillated, no cheilosis. Neck supple without adenopathy, thyroid not enlarged, . Lungs Clear to auscultation bilaterally. Cor normal S1, normal S2, regular rhythm, no murmur,  quiet precordium. Abdomen: scaphoid, active BS's Rectal:not done Extremities no pedal edema. Skin no lesions. Psychological  Non verbal.  Assessment and Plan:  21 yo WM with CP, permanent PEG, necessary for his nutritional support. Here for PEG change. 21F 2.4 cm Button   10/07/2011 Jeffery [name]

## 2011-10-07 NOTE — Discharge Instructions (Signed)
Resume previous tube feedings of Ensure 1 can tid irrigated with water Skin care, keep PEG site clean PEG will have to be changed in 1 year- it is 28F 2.5 cm button, next one should be 20 F  button

## 2011-10-07 NOTE — Op Note (Signed)
Gastrostomy change:  Reason for change: adaptor malfunction, last change was 09/2010, pt doing well on Ensure 1 can tid plus water  Existing PEG is 57F 2.4 cm button, unfortunately we have available 57F, 2.4 cm button  Procedure: no sedation Existing PEG was removed by deflating PEG balloon, obtaining 8 cc of murky fluid. Through the existing track the new button PEG was placed with help of obturator.Patency and position was checked by running 50cc on water by gravity. Balloon was inflated with 10cc of sterile saline and again the patency was checked by gravity feeding of water.

## 2011-10-08 ENCOUNTER — Encounter (HOSPITAL_COMMUNITY): Payer: Self-pay | Admitting: Internal Medicine

## 2012-04-04 ENCOUNTER — Telehealth: Payer: Self-pay | Admitting: Internal Medicine

## 2012-04-04 NOTE — Telephone Encounter (Signed)
Patient's mother Jeffery [name] called. Patient's gastrostomy tube is leaking. It is functional but leaking. Last changed 10/07/11(18 French 2.5 cm button gastrostomy). She also wanted Dr. [name] to know he has had pneumonia x 1 month. He finished his antibiotics last week. Please, advise.

## 2012-04-05 NOTE — Telephone Encounter (Signed)
I need to see were it is leaking. And decide if he needs a different size gastrosotmy. So I need to see it first and then we need to order the appropriate size for him. Last time they ordered the wrong size 76F instead of 58F.

## 2012-04-06 NOTE — Telephone Encounter (Signed)
Spoke with patient's mother. Needs afternoon OV and no Monday. Scheduled on 04/21/12 at 2:00 PM.

## 2012-04-21 ENCOUNTER — Encounter: Payer: Self-pay | Admitting: Internal Medicine

## 2012-04-21 ENCOUNTER — Ambulatory Visit (INDEPENDENT_AMBULATORY_CARE_PROVIDER_SITE_OTHER): Payer: Medicaid Other | Admitting: Internal Medicine

## 2012-04-21 VITALS — Ht 59.5 in | Wt 120.0 lb

## 2012-04-21 DIAGNOSIS — G808 Other cerebral palsy: Secondary | ICD-10-CM

## 2012-04-21 DIAGNOSIS — R633 Feeding difficulties: Secondary | ICD-10-CM

## 2012-04-21 NOTE — Progress Notes (Signed)
Jeffery [name] [birth year] MRN [id number]   History of Present Illness:  This is a 22 year old white male with cerebral palsy and seizure disorder. He is noncommunicative and non-ambulatory. He moves around in a wheelchair. Patient has a permanent percutaneous gastrostomy which is his sole source of nutrition. He has been using a Environmental manager button type tube. The last change was in September 2013 using 18 French 2.5 cm replacement gastrostomy. A prior gastrostomy was a 20 French 2.4 cm button. His adapter has been mal-functioning causing tube feedings to leak out. He had recent pneumonia. He has been followed by the Pulmonary Department at [facility]. He has had swallowing studies. He is currently on Ensure 4 cans a day. He remains in a semi-recumbent position most of the day.   Past Medical History  Diagnosis Date  . CP (cerebral palsy), spastic, quadriplegic   . Epilepsy   . Osteoporosis   . Kidney stones   . Inguinal hernia   . Undescended testes   . Seasonal allergies   . IVH (intraventricular hemorrhage)     Grade IV  . Hip dislocation, bilateral   . Pneumonia   . Seizures   . Sleep apnea   . Hx: UTI (urinary tract infection)    Past Surgical History  Procedure Laterality Date  . Excision of moles    . Peg placement      With multiple changes  . Gastrostomy tube, change / reposition  12/15/2010       . Button change  12/15/2010    Procedure: BUTTON CHANGE;  Surgeon: Iva Boop, MD;  Location: [facility];  Service: Endoscopy;  Laterality: N/A;  . Peg placement  10/07/2011    Procedure: PERCUTANEOUS ENDOSCOPIC GASTROSTOMY (PEG) REPLACEMENT;  Surgeon: Hart Carwin, MD;  Location: [facility];  Service: Endoscopy;  Laterality: N/A;  Needs 18 F 2.5 button ordered-dl    reports that he has never smoked. He has never used smokeless tobacco. He reports that he does not drink alcohol or use illicit drugs. family history is not on file. He is  adopted. Allergies  Allergen Reactions  . Adhesive (Tape)   . Depakote (Divalproex Sodium)     Causes pancreatitis         Review of Systems: Mother denies aspiration, LPR positive for constipation he has been on bowel regimen  The remainder of the 10 point ROS is negative except as outlined in H&P   Physical Exam: General appearance  Well developed, in no distress. He formed upper body. Non communicative  Eyes- non icteric. HEENT normal verbal Mouth no lesions, tongue papillated, no cheilosis. Neck supple without adenopathy, thyroid not enlarged, no carotid bruits, no JVD. Lungs Clear to auscultation bilaterally. Coughs periodically Cor normal S1, normal S2, regular rhythm, no murmur,  quiet precordium. Abdomen: Protuberant with normoactive bowel sounds, soft and nontender. Button gastrostomy in left upper quadrant, surrounding skin appears normal. There is no leakage from around the tube. The adapter is malfunctioning; feedings seep out when it opens and to a certain degree when it is closed as well. Rectal: Not done. Extremities no pedal edema. Lower extremity contractures. Skin no lesions. Neurological not oriented x 3, Nonverbal. Psychological normal mood and affect.  Assessment and Plan:  Problem #70 22 year old white male with cerebral palsy who is dependent on tube feedings. His gastrostomy has been malfunctioning. His family desires the same type gastrostomy for replacement. We will order a 20 or 22 [place] button gastrostomy, 2.4  or 2.5 cm long and schedule replacement. He will continue on antireflux measures and current tube feedings.   04/21/2012 [name]

## 2012-04-21 NOTE — Patient Instructions (Addendum)
You have been scheduled for a PEG change (to a 20 french, 2.5 cm button) @ [facility] on 06/05/12 (Monday) @ 8:30 am. Please arrive at outpatient registration (1st floor of hospital) no later than 8:00 am for your appointment.   CC: Dr Lovell Sheehan

## 2012-04-26 DIAGNOSIS — G40319 Generalized idiopathic epilepsy and epileptic syndromes, intractable, without status epilepticus: Secondary | ICD-10-CM | POA: Insufficient documentation

## 2012-04-26 DIAGNOSIS — G808 Other cerebral palsy: Secondary | ICD-10-CM | POA: Insufficient documentation

## 2012-04-26 DIAGNOSIS — Z8679 Personal history of other diseases of the circulatory system: Secondary | ICD-10-CM | POA: Insufficient documentation

## 2012-04-26 DIAGNOSIS — G40219 Localization-related (focal) (partial) symptomatic epilepsy and epileptic syndromes with complex partial seizures, intractable, without status epilepticus: Secondary | ICD-10-CM | POA: Insufficient documentation

## 2012-05-04 ENCOUNTER — Ambulatory Visit (INDEPENDENT_AMBULATORY_CARE_PROVIDER_SITE_OTHER): Payer: Medicaid Other | Admitting: Pediatrics

## 2012-05-04 DIAGNOSIS — G808 Other cerebral palsy: Secondary | ICD-10-CM

## 2012-05-04 DIAGNOSIS — G40319 Generalized idiopathic epilepsy and epileptic syndromes, intractable, without status epilepticus: Secondary | ICD-10-CM

## 2012-05-04 DIAGNOSIS — G40219 Localization-related (focal) (partial) symptomatic epilepsy and epileptic syndromes with complex partial seizures, intractable, without status epilepticus: Secondary | ICD-10-CM

## 2012-05-04 NOTE — Patient Instructions (Addendum)
It was good to see you today.  Call me to let me know if the increased dose has helped.  We can see him back sooner if it hasn't, to increase the dose again.

## 2012-05-05 ENCOUNTER — Encounter: Payer: Self-pay | Admitting: Pediatrics

## 2012-05-05 MED ORDER — BACLOFEN 40000 MCG/20ML IT SOLN: 40000.0000 ug | Freq: Once | INTRATHECAL | Status: DC
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
## 2012-05-08 ENCOUNTER — Telehealth: Payer: Self-pay | Admitting: Internal Medicine

## 2012-05-08 NOTE — Telephone Encounter (Signed)
Spoke with patient's caregiver and gave her the date and time of procedure.

## 2012-05-26 ENCOUNTER — Telehealth: Payer: Self-pay | Admitting: *Deleted

## 2012-05-26 NOTE — Telephone Encounter (Signed)
Jeffery [name] is calling to let Dr. [name] know that Jeffery [name] is doing much better since the increase a few weeks ago.  She is not expecting a return call, just wanted to let Dr. [name] know the increase worked.

## 2012-05-26 NOTE — Telephone Encounter (Signed)
Noted thank you

## 2012-05-29 ENCOUNTER — Telehealth: Payer: Self-pay | Admitting: Family

## 2012-05-29 ENCOUNTER — Telehealth: Payer: Self-pay | Admitting: *Deleted

## 2012-05-29 NOTE — Telephone Encounter (Signed)
I spoke with the mother for 1-1/2 minutes.  I don't think he has arthritis.  I think that his main problem is spasticity in that he is not used to using AFOs.  She can take him to his primary doctor, but I don't think that the answer will be different.  I don't think that increasing baclofen is going to help very much.  We will assess him when he returns.

## 2012-05-29 NOTE — Telephone Encounter (Signed)
Per Noreene Larsson @ [facility], patient's PEG- 20 French 2.5 cm tube is in endoscopy and is ready for use.

## 2012-05-29 NOTE — Telephone Encounter (Signed)
Mom [name] called to say that [name]'s clonus is better but the braces on ankles has been bothering him. He has no redness or skin breakdown. 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 Mom asked if increased Baclofen help or should he see his PCP to investigate arthritis at knees and ankles? Call Ward Givens @ [phone]

## 2012-06-04 NOTE — H&P (Signed)
  GLADYS GUTMAN 1990-08-18 MRN [id number]        History of Present Illness:  This is a 22 yo WM with CP and seizure disorder, admitted for outpatient change og PEG due to malfunction. Pt seen in the office on3/21/2014 where his PEG adaptor appeared to be leaking. Last PEG change  Was  In 10/2011 with 62F 22.5 cm button PEG. Prior PEG was 20 F. Pt is non communicative. He is dependent on his PEG for nutritional support.   Past Medical History  Diagnosis Date  . CP (cerebral palsy), spastic, quadriplegic   . Epilepsy   . Osteoporosis   . Kidney stones   . Inguinal hernia   . Undescended testes   . Seasonal allergies   . IVH (intraventricular hemorrhage)     Grade IV  . Hip dislocation, bilateral   . Pneumonia   . Seizures   . Sleep apnea   . Hx: UTI (urinary tract infection)   . Dysphagia   . Otitis media   . Retinopathy of prematurity   . 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 Neuromuscular scoliosis   . Osteoporosis   . Complex partial seizures   . Generalized convulsive epilepsy without mention of intractable epilepsy    Past Surgical History  Procedure Laterality Date  . Excision of moles    . Peg placement      With multiple changes  . Gastrostomy tube, change / reposition  12/15/2010       . Button change  12/15/2010    Procedure: BUTTON CHANGE;  Surgeon: Iva Boop, MD;  Location: [facility];  Service: Endoscopy;  Laterality: N/A;  . Peg placement  10/07/2011    Procedure: PERCUTANEOUS ENDOSCOPIC GASTROSTOMY (PEG) REPLACEMENT;  Surgeon: Hart Carwin, MD;  Location: [facility];  Service: Endoscopy;  Laterality: N/A;  Needs 18 F 2.5 button ordered-dl  . Inguinal hernia repair Bilateral 1992  . Retinopathy of prematurity surgery  1992  . Achilles tendon lengthening  12/1998  . Soft tissue releases  wrists and fingers  12/1998  . Intrathecal baclofen pump placement  07/25/2000    reports that he has never smoked. He has never used smokeless tobacco.  He reports that he does not drink alcohol or use illicit drugs. family history is not on file. He is adopted. Allergies  Allergen Reactions  . Adhesive (Tape)   . Depakote (Divalproex Sodium)     Causes pancreatitis         Review of Systems:mother denies coughing,  The remainder of the 10 point ROS is negative except as outlined in H&P   Physical Exam: General appearance  Non verbal, in no distress. Eyes- non icteric. HEENT nontraumatic, normocephalic. Mouth no lesions, tongue papillated, no cheilosis. Neck supple without adenopathy, thyroid not enlarged, no carotid bruits, no JVD. Lungs Clear to auscultation bilaterally.deformed thorax Cor normal S1, normal S2, regular rhythm, no murmur,  quiet precordium. Abdomen: Button PEG LUQ, nl B.S's Rectal:not done Extremities no pedal edema. Skin no lesions. Neurological alert and oriented x 3. Psychological normal mood and affect.  Assessment and Plan:  22 yo WM with malfunctioning PEG, admitted to outpatient endoscopy for change of PEG, mother, who is the care giver,  prefers to continue Button gastrostomy- 19F, 2.5 cm. NPO after MN   06/04/2012 [name]

## 2012-06-05 ENCOUNTER — Encounter (HOSPITAL_COMMUNITY)
Admission: RE | Disposition: A | Discharge status: Home / Self Care | Payer: Self-pay | Source: Ambulatory Visit | Attending: Internal Medicine

## 2012-06-05 ENCOUNTER — Ambulatory Visit (HOSPITAL_COMMUNITY)
Admission: RE | Admit: 2012-06-05 | Discharge: 2012-06-05 | Disposition: A | Discharge status: Home / Self Care | Payer: Medicaid Other | Source: Ambulatory Visit | Attending: Internal Medicine | Admitting: Internal Medicine

## 2012-06-05 ENCOUNTER — Encounter (HOSPITAL_COMMUNITY): Payer: Self-pay

## 2012-06-05 DIAGNOSIS — G4730 Sleep apnea, unspecified: Secondary | ICD-10-CM | POA: Insufficient documentation

## 2012-06-05 DIAGNOSIS — G808 Other cerebral palsy: Secondary | ICD-10-CM | POA: Insufficient documentation

## 2012-06-05 DIAGNOSIS — R1310 Dysphagia, unspecified: Secondary | ICD-10-CM | POA: Insufficient documentation

## 2012-06-05 DIAGNOSIS — Y832 Surgical operation with anastomosis, bypass or graft as the cause of abnormal reaction of the patient, or of later complication, without mention of misadventure at the time of the procedure: Secondary | ICD-10-CM | POA: Insufficient documentation

## 2012-06-05 DIAGNOSIS — M810 Age-related osteoporosis without current pathological fracture: Secondary | ICD-10-CM | POA: Insufficient documentation

## 2012-06-05 DIAGNOSIS — Z87440 Personal history of urinary (tract) infections: Secondary | ICD-10-CM | POA: Insufficient documentation

## 2012-06-05 DIAGNOSIS — R633 Feeding difficulties: Secondary | ICD-10-CM

## 2012-06-05 DIAGNOSIS — G40909 Epilepsy, unspecified, not intractable, without status epilepticus: Secondary | ICD-10-CM | POA: Insufficient documentation

## 2012-06-05 DIAGNOSIS — K9423 Gastrostomy malfunction: Secondary | ICD-10-CM | POA: Insufficient documentation

## 2012-06-05 HISTORY — PX: PEG PLACEMENT: SHX5437

## 2012-06-05 SURGERY — REPLACEMENT, PEG TUBE, WITHOUT ENDOSCOPY

## 2012-06-05 NOTE — Interval H&P Note (Signed)
History and Physical Interval Note:  06/05/2012 8:56 AM  Jeffery [name]  has presented today for surgery, with the diagnosis of feeding difficulties  The various methods of treatment have been discussed with the patient and family. After consideration of risks, benefits and other options for treatment, the patient has consented to  Procedure(s) with comments: PERCUTANEOUS ENDOSCOPIC GASTROSTOMY (PEG) REPLACEMENT (N/A) - button 46fr.2.5cm as a surgical intervention .  The patient's history has been reviewed, patient examined, no change in status, stable for surgery.  I have reviewed the patient's chart and labs.  Questions were answered to the patient's satisfaction.     [name]

## 2012-06-05 NOTE — Op Note (Signed)
[facility] [address] [place], [address]   OPERATIVE PROCEDURE REPORT  PATIENT: Jeffery [name], Jeffery [name]  MR#: [id number] BIRTHDATE: [birth year]  GENDER: Male ENDOSCOPIST: Hart Carwin, MD REFERRED XB:[id number], Harvette PROCEDURE DATE:  06/05/2012 PROCEDURE: change of PEG ASA CLASS:   3 INDICATIONS: PEG malfunction, last change  Sept 2013, 65F ,2.5 cm button MEDICATIONS: [Referring Physician TOPICAL ANESTHETIC:   none  DESCRIPTION OF PROCEDURE:  A history and physical had previously been performed.  The procedure, indications, potential complications , and alternatives were explained to the patient who appeared to understand.  Opportunity for questions was provided and informed consent was obtained.  The existing PEG tube was removed using traction technique.      The G-tube insertion site was then cleansed,  and the external bolster was placed over the tube to secure it to the abdominal wall.  A sterile dressing was then applied, and the procedure terminated.   The 65F 2.5 cm button gastrostomy was removed by gentle traction, new gastrostomy 20 F 2.5 cm BS button was placed through the existing track without difficulty, patency and position was checked by running 50cc of water by gravity into the PEG tubing. Gastrostomy site appears clean,  COMPLICATIONS: There were no complications. ENDOSCOPIC IMPRESSION: PEG replacement with 56F 2.5 cm BS button RECOMMENDATIONS:  resume tube feedings- 4 cans of ensure/day continue PPI antireflux measures PEG ought to be changed within 1 year  REPEAT EXAM: 1 year   ___________________________________ eSignedHart Carwin, MD 06/05/2012 10:00 AM   CC:

## 2012-06-06 ENCOUNTER — Encounter (HOSPITAL_COMMUNITY): Payer: Self-pay | Admitting: Internal Medicine

## 2012-06-12 ENCOUNTER — Other Ambulatory Visit: Payer: Self-pay | Admitting: Family

## 2012-06-12 DIAGNOSIS — G40219 Localization-related (focal) (partial) symptomatic epilepsy and epileptic syndromes with complex partial seizures, intractable, without status epilepticus: Secondary | ICD-10-CM

## 2012-06-12 DIAGNOSIS — G40319 Generalized idiopathic epilepsy and epileptic syndromes, intractable, without status epilepticus: Secondary | ICD-10-CM

## 2012-06-12 MED ORDER — LEVETIRACETAM 100 MG/ML PO SOLN: ORAL | Status: DC

## 2012-06-14 ENCOUNTER — Encounter: Payer: Self-pay | Admitting: Neurology

## 2012-06-14 ENCOUNTER — Ambulatory Visit (INDEPENDENT_AMBULATORY_CARE_PROVIDER_SITE_OTHER): Payer: Medicaid Other | Admitting: Neurology

## 2012-06-14 DIAGNOSIS — G808 Other cerebral palsy: Secondary | ICD-10-CM

## 2012-06-14 DIAGNOSIS — G40909 Epilepsy, unspecified, not intractable, without status epilepticus: Secondary | ICD-10-CM | POA: Insufficient documentation

## 2012-06-14 DIAGNOSIS — H6690 Otitis media, unspecified, unspecified ear: Secondary | ICD-10-CM | POA: Insufficient documentation

## 2012-06-14 DIAGNOSIS — H5089 Other specified strabismus: Secondary | ICD-10-CM

## 2012-06-14 DIAGNOSIS — Z87440 Personal history of urinary (tract) infections: Secondary | ICD-10-CM

## 2012-06-14 DIAGNOSIS — G40309 Generalized idiopathic epilepsy and epileptic syndromes, not intractable, without status epilepticus: Secondary | ICD-10-CM | POA: Insufficient documentation

## 2012-06-14 DIAGNOSIS — H35109 Retinopathy of prematurity, unspecified, unspecified eye: Secondary | ICD-10-CM | POA: Insufficient documentation

## 2012-06-14 DIAGNOSIS — R1310 Dysphagia, unspecified: Secondary | ICD-10-CM | POA: Insufficient documentation

## 2012-06-14 DIAGNOSIS — M4140 Neuromuscular scoliosis, site unspecified: Secondary | ICD-10-CM | POA: Insufficient documentation

## 2012-06-14 DIAGNOSIS — G40209 Localization-related (focal) (partial) symptomatic epilepsy and epileptic syndromes with complex partial seizures, not intractable, without status epilepticus: Secondary | ICD-10-CM

## 2012-06-14 DIAGNOSIS — G8250 Quadriplegia, unspecified: Secondary | ICD-10-CM

## 2012-06-14 DIAGNOSIS — G40219 Localization-related (focal) (partial) symptomatic epilepsy and epileptic syndromes with complex partial seizures, intractable, without status epilepticus: Secondary | ICD-10-CM | POA: Insufficient documentation

## 2012-06-14 DIAGNOSIS — M810 Age-related osteoporosis without current pathological fracture: Secondary | ICD-10-CM

## 2012-06-14 MED ORDER — ONABOTULINUMTOXINA 100 UNITS IJ SOLR: 300.0000 [IU] | Freq: Once | INTRAMUSCULAR | Status: DC

## 2012-06-14 NOTE — Progress Notes (Signed)
HPI: Samarion came in for EMG guided BOTOX injection for bilateral upper extremity spasticity, accompanied by his mother.  History: Patient is a 22 -year-old with spastic quadriparesis, severe cognitive delays, and complex partial seizures with secondary generalization as a result of extreme prematurity with grade 4 intraventricular hemorrhage. His mother adopt him and his twin sister since 11 months old.  He is wheelchair bound, depend on PEG tube feeding, he can communicate some with facial expression, but non verbal. He has severe nonfunctional spastic qudraplegia, s/p baclofen bump placement at [facility] Dr [name] refills his Baclofen bump, which helps his lower extremity spasticity, it is much easier for care giver to change his diaper, other wise he has frequent bilateral lower extremity spasticity, clonus, and hip pain.  He has been receiving BOTOX A injection from [facility] Dr. Yvone Neu over past 10 years, q3-4 months, it has been very helpful too, his upper extremity is more relaxed, it is easier to put on cloth and clean his hand after injection, last injection from faxed over note was in 09/26/2009, 300 units were injected into bilateral biceps, brachioradialis, pronator teres, ECU, FDP, mother wants to change care locally for convience.  Mother reported benefit usually lasts less than 3 months, she would then noticed increased difficulty to clean his hands, he also had some improvement with his grip with BOTOX A, is attending vacation program.  UPDATE May 14th 2014: Last EMG guided Botox injection was in September 2013, he did very well, no significant side effect, he had recurrent seizure few months ago, his Keppra dosage was increased by Dr. [name],    Physical Exam    Head:   microcephaly, no dysmorphic features  Neurologic Exam  Mental Status: sits in wheelchair, mute, does not follow commands Cranial Nerves:  symmetric facial grimace.    Motor: Spasticity is  moderate in his arms and legs, and he still has problems with  bilateral fisting of his hands and wrist drop. Sensory: Bilateral lower extrmity has moderate spasticity on passive movement Coordination:  cannot test Gait and Station:  wheelchair-bound    Assessement and Plan:  23 yo cerebral palsy, severe mental retardation, EMG guided BOTOX A injection for bilateral upper extremity spasticity, he has Baclofen pump, which has helped his lower extremity spasticity.  300 units were used, 100 units were dissovled into 2 cc of NS. (lot No.[id number], exp 01/2015)  Right biceps 50 units Right brachialis 25 units right flexor digitorum superficialis 25 units right pronator teres 25 units Right flexor carpi ulnaris 25 units  Left biceps 50  units, Left brachialis 25 units Left pronator teres 25 units  Left flexor digitorum profoundi 25 units Left flexor carpi ulnaris 25 units  He tolerate the injection very well, will return to clinic in 3 months, for repeat injection.

## 2012-07-24 ENCOUNTER — Telehealth: Payer: Self-pay

## 2012-07-24 NOTE — Telephone Encounter (Signed)
Dr. Janee Morn called and would like to consult with you on this patient please. Call: [phone]

## 2012-07-24 NOTE — Telephone Encounter (Signed)
I have called his hand surgeon Dr. Janee Morn, explained previous injection pattern.

## 2012-07-28 ENCOUNTER — Other Ambulatory Visit: Payer: Self-pay

## 2012-07-28 DIAGNOSIS — G40319 Generalized idiopathic epilepsy and epileptic syndromes, intractable, without status epilepticus: Secondary | ICD-10-CM

## 2012-07-28 DIAGNOSIS — G40219 Localization-related (focal) (partial) symptomatic epilepsy and epileptic syndromes with complex partial seizures, intractable, without status epilepticus: Secondary | ICD-10-CM

## 2012-07-28 MED ORDER — DIASTAT ACUDIAL 20 MG RE GEL: RECTAL | Status: DC

## 2012-08-15 ENCOUNTER — Telehealth: Payer: Self-pay | Admitting: Family

## 2012-08-15 ENCOUNTER — Ambulatory Visit (INDEPENDENT_AMBULATORY_CARE_PROVIDER_SITE_OTHER): Payer: Medicaid Other | Admitting: Pediatrics

## 2012-08-15 ENCOUNTER — Encounter: Payer: Self-pay | Admitting: Pediatrics

## 2012-08-15 DIAGNOSIS — G808 Other cerebral palsy: Secondary | ICD-10-CM

## 2012-08-15 DIAGNOSIS — G40319 Generalized idiopathic epilepsy and epileptic syndromes, intractable, without status epilepticus: Secondary | ICD-10-CM

## 2012-08-15 DIAGNOSIS — G40219 Localization-related (focal) (partial) symptomatic epilepsy and epileptic syndromes with complex partial seizures, intractable, without status epilepticus: Secondary | ICD-10-CM

## 2012-08-15 NOTE — Telephone Encounter (Signed)
Mom [name] called to report that Jeffery [name] was experiencing increase in clonus over last several days. She said that the school called to ask if this was seizure activity because clonus was so bad. She said that also having trouble when his diaper is changed or performing care and thinks his Baclofen dose needs to be increased. Not due for pump refill until August 15th. Mom asked to be called back at [phone]. Marcelino Duster, could you see when you could work him in for reprogramming. TG

## 2012-08-15 NOTE — Patient Instructions (Addendum)
I have now given Jeffery [name] bolus doses at 4:30 AM, 8:30 AM, 11:30 AM, and 6 PM area at I also increased his overall dose by 10%.Please let me know if this helps.

## 2012-08-15 NOTE — Telephone Encounter (Signed)
Dr. [name] has a cancellation on his schedule for today at 10:45 am, I have spoken to [facility] and she is bringing Jeffery [name] in this morning at 10:30 am. MB
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
## 2012-08-24 ENCOUNTER — Encounter (HOSPITAL_COMMUNITY): Payer: Self-pay | Admitting: Pharmacy Technician

## 2012-08-25 ENCOUNTER — Telehealth (INDEPENDENT_AMBULATORY_CARE_PROVIDER_SITE_OTHER): Payer: Self-pay

## 2012-08-25 NOTE — Telephone Encounter (Signed)
Erika from pre admit called needing epic orders for patient. She said Dr Maisie Fus was doing surgery. After reviewing, there is no surgery scheduled with [place] for this pt.

## 2012-08-28 ENCOUNTER — Other Ambulatory Visit: Payer: Self-pay | Admitting: Orthopedic Surgery

## 2012-08-28 NOTE — Progress Notes (Signed)
Jamesetta Orleans, secretary called Agustin Cree at Dr. Carollee Massed office and informed her that patient needed orders for pre-admit on 08/29/12. Olegario Messier informed Jamesetta Orleans that they were not going to enter any orders in [facility] and anesthesia is aware.

## 2012-08-28 NOTE — Pre-Procedure Instructions (Signed)
Jeffery [name]  08/28/2012   Your procedure is scheduled on:  Tuesday September 05, 2012.  Report to [facility] at 5:30 AM.  Call this number if you have problems the morning of surgery: [phone]   Remember:   Do not eat food or drink liquids after midnight.   Take these medicines the morning of surgery with A SIP OF WATER: Albuterol inhaler/nebulizer if needed wheezing or shortness of breath, Gabapentin (Neurontin), Lansoprazole (Prevacid), Levetiracetam (Keppra)   Do not wear jewelry  Do not wear lotions, powders, or colognes. You may wear deodorant.  Men may shave face and neck.  Do not bring valuables to the hospital.  [facility] is not responsible for any belongings or valuables.  Contacts, dentures or bridgework may not be worn into surgery.  Leave suitcase in the car. After surgery it may be brought to your room.  For patients admitted to the hospital, checkout time is 11:00 AM the day of discharge.   Patients discharged the day of surgery will not be allowed to drive home.  Name and phone number of your driver: Family  Special Instructions: Shower using CHG 2 nights before surgery and the night before surgery.  If you shower the day of surgery use CHG.  Use special wash - you have one bottle of CHG for all showers.  You should use approximately 1/3 of the bottle for each shower.   Please read over the following fact sheets that you were given: Pain Booklet, Coughing and Deep Breathing and Surgical Site Infection Prevention

## 2012-08-28 NOTE — Progress Notes (Addendum)
Anesthesia PAT Evaluation:  Patient is a 22 year old male (goes by the name of "Jeffery [name]") scheduled for bilateral lengthening of digital flexor tendons of the hands on 09/05/12 by Dr. Mack Hook of [facility].  He is here today with his mother and friend.  History includes extreme prematurity (25 1/2 weeks) with grade IV intracranial hemorrhage with CP and spastic quadriparesis with clonus, Baclofen intrathecal pump with increase in dose 08/15/12 (basal rate 9.7 mcg/hr, 20 mcg at 4:30 AM, 8:30 AM, 11:30 AM, and 6 PM. These are infused over 15 minutes. Total daily dose is 326.0 mcg), epilepsy/complex partial seizures, OSA, nephrolithiasis, osteoporosis, strabismus s/p surgery, neuromuscular scoliosis s/p Harrington rods/fusion, dysphagia s/p gastrostomy tube/button, achilles tendon lengthening '00, frequent pneumonia, bilateral hip dysplasia, complex thumb reconstruction '00.  He is non-verbal, but can respond some with facial expressions and gestures.  Mom reports he does not typically cry.  His Baclofen pump is managed by neurologist Dr. [name].  He has also received Botox for bilateral upper extremity spasticity on 06/14/12 by Dr. Levert Feinstein. PCP has been Dr. Royston Sinner Corrington who is no longer with [facility].  He is scheduled to see Dr. Donia Guiles next month.  Mom reports she adopted him at 10 months.  She has a nursing background and is very involved in his care.  She typed out a very detailed history and medication list.  She is concerned about Jeffery [name] having general anesthesia because of his frequent lung infections.  Approximately 4 years ago he was on a ventilator for 5 days for swine flu.  He was very difficult to wean off the vent.  She says he has been treated for "pneumonia" four times already this year, last on 08/23/12. He did not have an xray to confirm PNA, but was treated with dual antibiotics for productive cough and sweating without fever.  Today his lungs are a little course/scatter  rhonchi today, but sound loose.  I think at least most his abnormal lung sounds may clear with a good cough.  Mom reports he is able to cough and clear his secretions.  He does not appear to be in any acute distress.  O2 sats were 94-95% earlier today.  Mom feels he is nearly back to baseline and will have completed the 10 day course of antibiotics prior to surgery.  He wears a pulse oximeter at night and has not had any hypoxia.  Mom reports that he had two "negative" swallow studies in the past, but only gets nutrition and meds via his G-button.  He has not had any sustained seizures requiring Diastat in eight years.  His last seizure was 3 months ago and lasted on 30-45 seconds. He can run bradycardic at times and actually required atropine when his Baclofen pump was last changed.  He had a normal cardiac work-up including EKG and echo approximately 15 years ago.  Two years ago he saw cardiologist Dr. Chales Abrahams for a 30 day Holter monitor and had no sustained episodes of bradycardia.  Of note, he does have a new closed blister on his right foot.  He is seeing a specialist at [facility] on 09/01/12.  I left a voicemail with Agustin Cree at Dr. Harold Hedge office regarding this, and mom was asked to update Dr. Carollee Massed office on Friday or Monday to ensure this would not interfere with plans for hand surgery.    CMET from 07/19/12 showed Na 135, K 4.6, glucose 98, Cr 0.6, Alk Phos 127, AST 46, ALT 43,  total protein 7.7. I had previously reviewed his history with anesthesiologist Dr. Ivin Booty and I also reviewed patient's frequent URI/PNA history with Dr. Jacklynn Bue today.  No plans for additional labs or CXR today.  Mom is very atune to [name]'s well being. She understands that he will need to be at his baseline for surgery.  If any new or worsening URI symptoms she will notify the appropriate physicians.  He will be evaluated by his assigned anesthesiologist on the day of surgery and a decision for pre-operative CXR and/or  Hemacue or ISTAT labs will be considered at that time based on findings.  Mom will give patient Keppra and Prevacid (via G-button) and Albuterol at 0430 on the day of surgery.  General anesthesia is planned.  Velna Ochs [facility]/Anesthesiology Phone [phone] 08/29/2012 11:21 AM

## 2012-08-29 ENCOUNTER — Encounter (HOSPITAL_COMMUNITY): Payer: Self-pay

## 2012-08-29 ENCOUNTER — Encounter (HOSPITAL_COMMUNITY)
Admission: RE | Admit: 2012-08-29 | Discharge: 2012-08-29 | Disposition: A | Discharge status: Home / Self Care | Payer: Medicaid Other | Source: Ambulatory Visit | Attending: Orthopedic Surgery | Admitting: Orthopedic Surgery

## 2012-08-29 HISTORY — DX: Bradycardia, unspecified: R00.1

## 2012-08-29 HISTORY — DX: Blister (nonthermal), right foot, initial encounter: S90.821A

## 2012-08-29 NOTE — Progress Notes (Signed)
Legal guardian Jeffery [name] at chairside with patient. PCP will be [name] at [facility] in [place], [place] on [address]. First appointment will be on September 11, 2012. Past PCP was Dr. Arlington Calix at [facility] also in [facility]. Other encounters in [facility]. Currently [place], [place] is seeing patient at this time.

## 2012-09-01 DIAGNOSIS — S90829A Blister (nonthermal), unspecified foot, initial encounter: Secondary | ICD-10-CM | POA: Insufficient documentation

## 2012-09-04 MED ORDER — CEFAZOLIN SODIUM-DEXTROSE 2-3 GM-% IV SOLR
2.0000 g | INTRAVENOUS | Status: DC
  Filled 2012-09-04: qty 50

## 2012-09-04 NOTE — H&P (Signed)
Jeffery [name] is an 22 y.o. male.   CC / Reason for Visit: Bilateral hand contractures HPI: This patient is a 22 year old male with cerebral palsy who presents for evaluation of bilateral hand contractures.  He is previously undergone complex thumb reconstruction is in [place], by Dr. Montel Clock.  Records indicate that the procedure was performed on 12-03-98 and included spastic thumb contracture releases with FPL transfer to the extensor mechanism, tenodesis of the distal stump, release of the abductor muscle and tendon and a local rotation flap for coverage.  It does not appear from the operative report that anything was performed for finger contracture.  Apparently, the contracture in the fingers has progressed, now making hygiene difficult.  It is said that he has some rudimentary grasp and release function with the hand.  He continues to get regular intermittent Botox injections, but the contracture in the hands progresses.  Past Medical History  Diagnosis Date  . CP (cerebral palsy), spastic, quadriplegic   . Epilepsy   . Osteoporosis   . Kidney stones   . Inguinal hernia   . Undescended testes   . Seasonal allergies   . IVH (intraventricular hemorrhage)     Grade IV  . Hip dislocation, bilateral   . Pneumonia   . Seizures   . Hx: UTI (urinary tract infection)   . Dysphagia   . Otitis media   . Retinopathy of prematurity   . 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 Neuromuscular scoliosis   . Osteoporosis   . Complex partial seizures   . Generalized convulsive epilepsy without mention of intractable epilepsy   . Epilepsy   . Sinus bradycardia     HR drops to 38-40 while sleeping  . Sleep apnea     BiPAP  . Blister of right heel     fluid filled; origin unknown    Past Surgical History  Procedure Laterality Date  . Excision of moles    . Peg placement      With multiple changes  . Gastrostomy tube, change / reposition  12/15/2010       . Button change  12/15/2010   Procedure: BUTTON CHANGE;  Surgeon: Iva Boop, MD;  Location: [facility];  Service: Endoscopy;  Laterality: N/A;  . Peg placement  10/07/2011    Procedure: PERCUTANEOUS ENDOSCOPIC GASTROSTOMY (PEG) REPLACEMENT;  Surgeon: Hart Carwin, MD;  Location: [facility];  Service: Endoscopy;  Laterality: N/A;  Needs 18 F 2.5 button ordered-dl  . Inguinal hernia repair Bilateral 1992  . Retinopathy of prematurity surgery  1992  . Achilles tendon lengthening  12/1998  . Soft tissue releases  wrists and fingers  12/1998  . Intrathecal baclofen pump placement  07/25/2000  . Peg placement N/A 06/05/2012    Procedure: PERCUTANEOUS ENDOSCOPIC GASTROSTOMY (PEG) REPLACEMENT;  Surgeon: Hart Carwin, MD;  Location: [facility];  Service: Endoscopy;  Laterality: N/A;  button 78fr.2.5cm  . Eye surgery Bilateral     Retinal   . Back surgery      Harrington Rods in back needs to be log rolled    Family History  Problem Relation Age of Onset  . Adopted: Yes   Social History:  reports that he has never smoked. He has never used smokeless tobacco. He reports that he does not drink alcohol or use illicit drugs.  Allergies:  Allergies  Allergen Reactions  . Adhesive (Tape)     Rips skin off  . Depakote (Divalproex Sodium)  Causes pancreatitis     No prescriptions prior to admission    No results found for this or any previous visit (from the past 48 hour(s)). No results found.  Review of Systems  All other systems reviewed and are negative.    There were no vitals taken for this visit. Physical Exam  Constitutional:  WD, WN, NAD HEENT:  NCAT, EOMI Neuro/Psych:  Alert & oriented to person, place, and time; appropriate mood & affect Lymphatic: No generalized UE edema or lymphadenopathy Extremities / MSK:  Both UE are normal with respect to appearance, ranges of motion, joint stability, muscle strength/tone, sensation, & perfusion except as otherwise noted:  Bilaterally there are  well-healed incisions on the volar aspect of the wrists as well as about the thumb.  Evidence of the previous soft tissue rotation flap is present.  The thumbs are actually in good position and alignment, fairly stiff, and the fingers are all clenched into the fist bilaterally.  The FDS appears tight, not the FDP.  Labs / Xrays:  No radiographic studies obtained today.  Assessment: Bilateral digital contractures in the setting of CP  Plan:  Bilateral STP transfers for digital spasticity in the setting of cerebral palsy, refractory to Botox treatment.  The details of the operative procedure were discussed with the patient and his mother.  Questions were invited and answered.  In addition to the goal of the procedure, the risks of the procedure to include but not limited to bleeding; infection; damage to the nerves or blood vessels that could result in bleeding, numbness, weakness, chronic pain, and the need for additional procedures; stiffness; the need for revision surgery; and anesthetic risks, the worst of which is death, were reviewed.  No specific outcome was guaranteed or implied.  Informed consent was obtained.     Elzina Devera A. 09/04/2012, 7:02 PM

## 2012-09-05 ENCOUNTER — Ambulatory Visit (HOSPITAL_COMMUNITY): Payer: Medicaid Other | Admitting: Certified Registered"

## 2012-09-05 ENCOUNTER — Encounter (HOSPITAL_COMMUNITY)
Admission: RE | Disposition: A | Discharge status: Home / Self Care | Payer: Self-pay | Source: Ambulatory Visit | Attending: Orthopedic Surgery

## 2012-09-05 ENCOUNTER — Encounter (HOSPITAL_COMMUNITY): Payer: Self-pay | Admitting: Vascular Surgery

## 2012-09-05 ENCOUNTER — Ambulatory Visit (HOSPITAL_COMMUNITY)
Admission: RE | Admit: 2012-09-05 | Discharge: 2012-09-05 | Disposition: A | Discharge status: Home / Self Care | Payer: Medicaid Other | Source: Ambulatory Visit | Attending: Orthopedic Surgery | Admitting: Orthopedic Surgery

## 2012-09-05 ENCOUNTER — Encounter (HOSPITAL_COMMUNITY): Payer: Self-pay | Admitting: Surgery

## 2012-09-05 ENCOUNTER — Ambulatory Visit (HOSPITAL_COMMUNITY): Payer: Medicaid Other

## 2012-09-05 DIAGNOSIS — M810 Age-related osteoporosis without current pathological fracture: Secondary | ICD-10-CM | POA: Insufficient documentation

## 2012-09-05 DIAGNOSIS — Z888 Allergy status to other drugs, medicaments and biological substances status: Secondary | ICD-10-CM | POA: Insufficient documentation

## 2012-09-05 DIAGNOSIS — J301 Allergic rhinitis due to pollen: Secondary | ICD-10-CM | POA: Insufficient documentation

## 2012-09-05 DIAGNOSIS — G808 Other cerebral palsy: Secondary | ICD-10-CM | POA: Insufficient documentation

## 2012-09-05 DIAGNOSIS — I498 Other specified cardiac arrhythmias: Secondary | ICD-10-CM | POA: Insufficient documentation

## 2012-09-05 DIAGNOSIS — H35179 Retrolental fibroplasia, unspecified eye: Secondary | ICD-10-CM | POA: Insufficient documentation

## 2012-09-05 DIAGNOSIS — G40309 Generalized idiopathic epilepsy and epileptic syndromes, not intractable, without status epilepticus: Secondary | ICD-10-CM | POA: Insufficient documentation

## 2012-09-05 DIAGNOSIS — M24549 Contracture, unspecified hand: Secondary | ICD-10-CM | POA: Insufficient documentation

## 2012-09-05 DIAGNOSIS — Z931 Gastrostomy status: Secondary | ICD-10-CM | POA: Insufficient documentation

## 2012-09-05 DIAGNOSIS — G809 Cerebral palsy, unspecified: Secondary | ICD-10-CM | POA: Insufficient documentation

## 2012-09-05 DIAGNOSIS — M6240 Contracture of muscle, unspecified site: Secondary | ICD-10-CM | POA: Insufficient documentation

## 2012-09-05 DIAGNOSIS — Z9109 Other allergy status, other than to drugs and biological substances: Secondary | ICD-10-CM | POA: Insufficient documentation

## 2012-09-05 HISTORY — PX: TENDON REPAIR: SHX5111

## 2012-09-05 LAB — POCT I-STAT 4, (NA,K, GLUC, HGB,HCT)
Glucose, Bld: 103 mg/dL — ABNORMAL HIGH (ref 70–99)
HCT: 51 % (ref 39.0–52.0)
Hemoglobin: 17.3 g/dL — ABNORMAL HIGH (ref 13.0–17.0)
Potassium: 4 mEq/L (ref 3.5–5.1)
Sodium: 142 mEq/L (ref 135–145)

## 2012-09-05 SURGERY — TENDON REPAIR
Anesthesia: General | Site: Arm Lower | Laterality: Bilateral | Wound class: Clean

## 2012-09-05 MED ORDER — MIDAZOLAM HCL 2 MG/2ML IJ SOLN: 0.5000 mg | Freq: Once | INTRAMUSCULAR | Status: DC | PRN

## 2012-09-05 MED ORDER — ACETAMINOPHEN-CODEINE 120-12 MG/5ML PO SOLN: 5.0000 mL | Freq: Four times a day (QID) | ORAL | Status: DC | PRN

## 2012-09-05 MED ORDER — ONDANSETRON HCL 4 MG/2ML IJ SOLN: 4.0000 mg | Freq: Once | INTRAMUSCULAR | Status: DC | PRN

## 2012-09-05 MED ORDER — MIDAZOLAM HCL 5 MG/5ML IJ SOLN
INTRAMUSCULAR | Status: DC | PRN
  Administered 2012-09-05 (×2): 1 mg via INTRAVENOUS

## 2012-09-05 MED ORDER — EPHEDRINE SULFATE 50 MG/ML IJ SOLN
INTRAMUSCULAR | Status: DC | PRN
  Administered 2012-09-05 (×3): 5 mg via INTRAVENOUS

## 2012-09-05 MED ORDER — HYDROMORPHONE HCL PF 1 MG/ML IJ SOLN
0.2500 mg | INTRAMUSCULAR | Status: DC | PRN
Start: 2012-09-05 — End: 2012-09-05

## 2012-09-05 MED ORDER — ARTIFICIAL TEARS OP OINT
TOPICAL_OINTMENT | OPHTHALMIC | Status: DC | PRN
  Administered 2012-09-05: 1 via OPHTHALMIC

## 2012-09-05 MED ORDER — BUPIVACAINE-EPINEPHRINE (PF) 0.5% -1:200000 IJ SOLN
INTRAMUSCULAR | Status: AC
  Filled 2012-09-05: qty 10

## 2012-09-05 MED ORDER — LACTATED RINGERS IV SOLN
INTRAVENOUS | Status: DC
  Administered 2012-09-05: 08:00:00 via INTRAVENOUS

## 2012-09-05 MED ORDER — GLYCOPYRROLATE 0.2 MG/ML IJ SOLN
INTRAMUSCULAR | Status: DC | PRN
  Administered 2012-09-05: 0.3 mg via INTRAVENOUS

## 2012-09-05 MED ORDER — ROCURONIUM BROMIDE 100 MG/10ML IV SOLN
INTRAVENOUS | Status: DC | PRN
  Administered 2012-09-05: 40 mg via INTRAVENOUS

## 2012-09-05 MED ORDER — NEOSTIGMINE METHYLSULFATE 1 MG/ML IJ SOLN
INTRAMUSCULAR | Status: DC | PRN
  Administered 2012-09-05: 2 mg via INTRAVENOUS

## 2012-09-05 MED ORDER — HYDROMORPHONE HCL PF 1 MG/ML IJ SOLN
INTRAMUSCULAR | Status: AC
  Administered 2012-09-05: 0.25 mg via INTRAVENOUS
  Filled 2012-09-05: qty 1

## 2012-09-05 MED ORDER — BUPIVACAINE-EPINEPHRINE PF 0.5-1:200000 % IJ SOLN
INTRAMUSCULAR | Status: DC | PRN
  Administered 2012-09-05: 20 mL

## 2012-09-05 MED ORDER — FENTANYL CITRATE 0.05 MG/ML IJ SOLN
INTRAMUSCULAR | Status: DC | PRN
  Administered 2012-09-05: 50 ug via INTRAVENOUS
  Administered 2012-09-05: 100 ug via INTRAVENOUS
  Administered 2012-09-05: 50 ug via INTRAVENOUS

## 2012-09-05 MED ORDER — CEFAZOLIN SODIUM 1-5 GM-% IV SOLN
1.0000 g | INTRAVENOUS | Status: AC
  Administered 2012-09-05: 1 g via INTRAVENOUS
  Filled 2012-09-05: qty 50

## 2012-09-05 MED ORDER — LIDOCAINE HCL (CARDIAC) 20 MG/ML IV SOLN
INTRAVENOUS | Status: DC | PRN
  Administered 2012-09-05: 20 mg via INTRAVENOUS

## 2012-09-05 MED ORDER — 0.9 % SODIUM CHLORIDE (POUR BTL) OPTIME
TOPICAL | Status: DC | PRN
  Administered 2012-09-05: 1000 mL

## 2012-09-05 MED ORDER — ONDANSETRON HCL 4 MG/2ML IJ SOLN
INTRAMUSCULAR | Status: DC | PRN
  Administered 2012-09-05: 4 mg via INTRAVENOUS

## 2012-09-05 MED ORDER — LACTATED RINGERS IV SOLN
INTRAVENOUS | Status: DC | PRN
  Administered 2012-09-05: 07:00:00 via INTRAVENOUS

## 2012-09-05 MED ORDER — PROPOFOL 10 MG/ML IV BOLUS
INTRAVENOUS | Status: DC | PRN
  Administered 2012-09-05: 100 mg via INTRAVENOUS

## 2012-09-05 SURGICAL SUPPLY — 42 items
BANDAGE COBAN STERILE 2 (GAUZE/BANDAGES/DRESSINGS) IMPLANT
BANDAGE GAUZE ELAST BULKY 4 IN (GAUZE/BANDAGES/DRESSINGS) ×4 IMPLANT
BLADE SURG 15 STRL LF DISP TIS (BLADE) ×1 IMPLANT
BLADE SURG 15 STRL SS (BLADE) ×1
BNDG COHESIVE 4X5 TAN STRL (GAUZE/BANDAGES/DRESSINGS) ×4 IMPLANT
BNDG ESMARK 4X9 LF (GAUZE/BANDAGES/DRESSINGS) ×2 IMPLANT
BRUSH SCRUB EZ PLAIN DRY (MISCELLANEOUS) IMPLANT
CANISTER SUCTION 2500CC (MISCELLANEOUS) ×2 IMPLANT
CHLORAPREP W/TINT 26ML (MISCELLANEOUS) ×4 IMPLANT
CORDS BIPOLAR (ELECTRODE) ×2 IMPLANT
COVER LIGHT HANDLE  DEROYL (MISCELLANEOUS) ×2 IMPLANT
CUFF TOURNIQUET SINGLE 18IN (TOURNIQUET CUFF) ×4 IMPLANT
CUFF TOURNIQUET SINGLE 24IN (TOURNIQUET CUFF) IMPLANT
DRAPE SURG 17X23 STRL (DRAPES) ×4 IMPLANT
DRSG ADAPTIC 3X8 NADH LF (GAUZE/BANDAGES/DRESSINGS) ×2 IMPLANT
DRSG EMULSION OIL 3X3 NADH (GAUZE/BANDAGES/DRESSINGS) IMPLANT
GLOVE BIO SURGEON STRL SZ7 (GLOVE) ×8 IMPLANT
GLOVE BIO SURGEON STRL SZ7.5 (GLOVE) ×2 IMPLANT
GLOVE BIOGEL PI IND STRL 7.0 (GLOVE) ×2 IMPLANT
GLOVE BIOGEL PI IND STRL 8 (GLOVE) ×1 IMPLANT
GLOVE BIOGEL PI INDICATOR 7.0 (GLOVE) ×2
GLOVE BIOGEL PI INDICATOR 8 (GLOVE) ×1
GOWN PREVENTION PLUS XLARGE (GOWN DISPOSABLE) IMPLANT
KIT BASIN OR (CUSTOM PROCEDURE TRAY) ×2 IMPLANT
NEEDLE 22X1 1/2 (OR ONLY) (NEEDLE) ×2 IMPLANT
NEEDLE HYPO 25X1 1.5 SAFETY (NEEDLE) IMPLANT
NS IRRIG 1000ML POUR BTL (IV SOLUTION) ×2 IMPLANT
PACK ORTHO EXTREMITY (CUSTOM PROCEDURE TRAY) ×2 IMPLANT
PAD CAST 4YDX4 CTTN HI CHSV (CAST SUPPLIES) IMPLANT
PADDING CAST ABS 4INX4YD NS (CAST SUPPLIES) ×2
PADDING CAST ABS COTTON 4X4 ST (CAST SUPPLIES) ×2 IMPLANT
PADDING CAST COTTON 4X4 STRL (CAST SUPPLIES)
SPONGE GAUZE 4X4 12PLY (GAUZE/BANDAGES/DRESSINGS) ×2 IMPLANT
STOCKINETTE 4X48 STRL (DRAPES) ×2 IMPLANT
SUT VIC AB 2-0 CT3 27 (SUTURE) IMPLANT
SUT VICRYL 4-0 PS2 18IN ABS (SUTURE) IMPLANT
SUT VICRYL RAPIDE 4/0 PS 2 (SUTURE) ×4 IMPLANT
SYR CONTROL 10ML LL (SYRINGE) ×2 IMPLANT
SYRINGE 10CC LL (SYRINGE) IMPLANT
TOWEL OR 17X24 6PK STRL BLUE (TOWEL DISPOSABLE) ×4 IMPLANT
TUBE CONNECTING 12X1/4 (SUCTIONS) ×2 IMPLANT
UNDERPAD 30X30 INCONTINENT (UNDERPADS AND DIAPERS) ×4 IMPLANT

## 2012-09-05 NOTE — Progress Notes (Signed)
Repiort to [employer] RN as primary caregiver

## 2012-09-05 NOTE — Anesthesia Preprocedure Evaluation (Addendum)
Anesthesia Evaluation  Patient identified by MRN, date of birth, ID band Patient awake    Reviewed: Allergy & Precautions, H&P , NPO status , Patient's Chart, lab work & pertinent test results  Airway Mallampati: IV TM Distance: >3 FB Neck ROM: Full    Dental  (+) Teeth Intact and Dental Advisory Given   Pulmonary  breath sounds clear to auscultation        Cardiovascular Rhythm:Regular Rate:Normal     Neuro/Psych    GI/Hepatic   Endo/Other    Renal/GU      Musculoskeletal   Abdominal   Peds  Hematology   Anesthesia Other Findings   Reproductive/Obstetrics                          Anesthesia Physical Anesthesia Plan  ASA: III  Anesthesia Plan: General   Post-op Pain Management:    Induction: Intravenous  Airway Management Planned: Oral ETT  Additional Equipment:   Intra-op Plan:   Post-operative Plan: Extubation in OR  Informed Consent: I have reviewed the patients History and Physical, chart, labs and discussed the procedure including the risks, benefits and alternatives for the proposed anesthesia with the patient or authorized representative who has indicated his/her understanding and acceptance.   Dental advisory given  Plan Discussed with: CRNA and Anesthesiologist  Anesthesia Plan Comments: (Severe cerebral palsy with bilateral hand contractures Seizure disorder on kepra Sleep apnea on BiPAP Severe scoliosis S/P harrington Rod fusion Gastrostomy tube   Plan GA with Oral ETT and central line  Jeffery [name])        Anesthesia Quick Evaluation

## 2012-09-05 NOTE — Anesthesia Postprocedure Evaluation (Signed)
  Anesthesia Post-op Note  Patient: Jeffery [name]  Procedure(s) Performed: Procedure(s): LENGTHENING OF DIGITAL FLEXOR TENDONS BILTERAL HANDS (Bilateral)  Patient Location: PACU  Anesthesia Type:General  Level of Consciousness: awake and alert   Airway and Oxygen Therapy: Patient Spontanous Breathing  Post-op Pain: mild  Post-op Assessment: Post-op Vital signs reviewed, Patient's Cardiovascular Status Stable, Respiratory Function Stable, Patent Airway, No signs of Nausea or vomiting and Pain level controlled  Post-op Vital Signs: stable  Complications: No apparent anesthesia complications

## 2012-09-05 NOTE — Transfer of Care (Signed)
Immediate Anesthesia Transfer of Care Note  Patient: Jeffery [name]  Procedure(s) Performed: Procedure(s): LENGTHENING OF DIGITAL FLEXOR TENDONS BILTERAL HANDS (Bilateral)  Patient Location: PACU  Anesthesia Type:General  Level of Consciousness: lethargic and responds to stimulation  Airway & Oxygen Therapy: Patient Spontanous Breathing and Patient connected to nasal cannula oxygen  Post-op Assessment: Report given to PACU RN  Post vital signs: Reviewed and stable  Complications: No apparent anesthesia complications

## 2012-09-05 NOTE — Interval H&P Note (Signed)
History and Physical Interval Note:  09/05/2012 7:35 AM  Jeffery [name]  has presented today for surgery, with the diagnosis of CEREBRAL PALSY WITH DIGITAL SPASCITY & CONTACTURE  The various methods of treatment have been discussed with the patient and family. After consideration of risks, benefits and other options for treatment, the patient has consented to  Procedure(s): LENGTHENING OF DIGITAL FLEXOR TENDONS BILTERAL HANDS (Bilateral) as a surgical intervention .  We discussed that I would decide intra-operatively whether to perform fractional lengthening or STP transfer.The patient's history has been reviewed, patient examined, no change in status, stable for surgery.  I have reviewed the patient's chart and labs.  Questions were answered to the patient's satisfaction.     [name]

## 2012-09-05 NOTE — Anesthesia Procedure Notes (Signed)
Procedure Name: Intubation Date/Time: 09/05/2012 7:52 AM Performed by: Jefm Miles E Pre-anesthesia Checklist: Patient identified, Timeout performed, Emergency Drugs available, Suction available and Patient being monitored Patient Re-evaluated:Patient Re-evaluated prior to inductionOxygen Delivery Method: Circle system utilized Preoxygenation: Pre-oxygenation with 100% oxygen Intubation Type: IV induction Ventilation: Two handed mask ventilation required Laryngoscope Size: Mac and 3 Grade View: Grade I Tube type: Oral Tube size: 7.5 mm Number of attempts: 1 Airway Equipment and Method: Stylet Placement Confirmation: ETT inserted through vocal cords under direct vision,  breath sounds checked- equal and bilateral and positive ETCO2 Secured at: 21 cm Tube secured with: Tape Dental Injury: Teeth and Oropharynx as per pre-operative assessment

## 2012-09-05 NOTE — Preoperative (Signed)
Beta Blockers   Reason not to administer Beta Blockers:Not Applicable 

## 2012-09-05 NOTE — Op Note (Signed)
09/05/2012  10:08 AM  PATIENT:  Jeffery [name]  22 y.o. 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 Janee Morn, MD  PHYSICIAN ASSISTANT: None  ANESTHESIA:  general  SPECIMENS:  None  DRAINS:   None  PREOPERATIVE INDICATIONS:  Jeffery [name] is a  22 y.o. 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 Spasticity seemed primarily related FDS and had failed to respond to Botox injection. We discussed methods for proceed operatively to include the pros and cons of fractional lengthening versus STP transfer, with the decision to be made intraoperatively which to perform.    The risks benefits and alternatives were discussed with the patient's mother preoperatively including but not limited to the risks of infection, bleeding, nerve injury, cardiopulmonary complications, the need for revision surgery, among others, and the patient verbalized understanding and consented to proceed.  OPERATIVE IMPLANTS: None  OPERATIVE FINDINGS: Tight FDS bilaterally, after fractional lengthening no longer tight, but digits remained with significant PIP and MP flexion contractures. Moderate improvement in the ability to get the fingers out of the palm was obtained.  OPERATIVE PROCEDURE:  After receiving prophylactic antibiotics, the patient was escorted to the operative theatre and placed in a supine position.  A surgical "time-out" was performed during which the planned procedure, proposed operative site, and the correct patient identity were compared to the operative consent and agreement confirmed by the circulating nurse according to current facility policy. General anesthesia was administered anesthesia inserted a central line. Following application of a tourniquet to the operative  extremity, the exposed skin was prepped with Chloraprep and draped in the usual sterile fashion.  The limb was exsanguinated with an Esmarch bandage and the tourniquet inflated to approximately higher than systolic BP.  The left side was addressed first. The work was complicated by inability to externally rotate the shoulder and extend the elbow, causing the hand to remain several inches off of the working surface and held by an Geophysicist/field seismologist. A linear longitudinal incision was made over the volar aspect of the forearm, approximating the central third looking for the muscle tendon junction region of the FDS. The skin was incised sharply with scalpel. Subcutaneous tissues were dissected with spreading dissection. The form fashion was split. Palmaris longus was a very small tendon and this was split. FCR was retracted radially, ulnar neurovascular bundle ulnarly thus exposing the FDS. Individually on tendon and time fractional lengthening was performed attempting to create 2 oblique incisions for each tendon within the muscle tendon junction region. As this was done, moderate traction was obtained, allowing the digits to get out of the palm, but still with underlying joint contractures of the PIP and MP joints. The tourniquet was released, additional hemostasis obtained, and the wound is copiously irrigated. The skin was closed with 4-0 Vicryl Rapide running horizontal mattress suture. 10 mL as above half percent Marcaine with epinephrine was instilled in and around the incision to help with postoperative pain and a splint-dressing was applied consisting of Adaptic, gauze, fanfolded Kerlix, Webril, 3 x 12 fiberglass splint placed volarly, and Coban.    Attention was then directed to the right side which was performed in an identical manner. Following completion of both sides, the patient was awakened and taken to the recovery room.  DISPOSITION: Barring any anesthetic complications, the patient will  be  discharged home today with typical postop instructions, leaving the splint-dressing in place, clean and dry until returning in 10-15 days.

## 2012-09-06 ENCOUNTER — Telehealth: Payer: Self-pay | Admitting: Internal Medicine

## 2012-09-06 NOTE — Telephone Encounter (Signed)
Please set up pt to come to [facility] next week for me to look at his PEG and order 70F 2.5 cm button gastrostomy and 70F regular ( not button) Replacement gastrostomy and I will consider changing it. OK to refill Bactriban. Also, cut back on TF's  By 1/2  Of the can each feeding to prevent stomch from overdistending. Is he on PPI twice a day?

## 2012-09-06 NOTE — Telephone Encounter (Signed)
Spoke with [name] patient's caregiver. She states the feeding tube is leaking bad around the tube area not the stoma. States it had been leaking a little but is "pouring out today."( PEG 20 [place] 2.5 cm) Also, reports for the last couple of weeks, it has been red around the insertion site and she has been using Bactroban. She is asking for refill on Bactroban. Please, advise.

## 2012-09-07 ENCOUNTER — Telehealth: Payer: Self-pay | Admitting: Internal Medicine

## 2012-09-07 ENCOUNTER — Encounter (HOSPITAL_COMMUNITY): Payer: Self-pay | Admitting: Orthopedic Surgery

## 2012-09-07 DIAGNOSIS — K9423 Gastrostomy malfunction: Secondary | ICD-10-CM

## 2012-09-07 MED ORDER — MUPIROCIN 2 % EX OINT: 1.0000 "application " | TOPICAL_OINTMENT | CUTANEOUS | Status: AC | PRN

## 2012-09-07 MED ORDER — LANSOPRAZOLE 30 MG PO TBDP: ORAL_TABLET | ORAL | Status: DC

## 2012-09-07 NOTE — Telephone Encounter (Signed)
It is OK to cut back to 1/2 can 8x/day.

## 2012-09-07 NOTE — Telephone Encounter (Signed)
Scheduled patient on 09/14/12 at 9:00 AM at [facility] endo for PEG replacement(Jill). Booking number [id number]. Noreene Larsson to order 22 F 2.5 cm Button gastrostomy and 22 F regular replacement gastrostomy. NPO after midnight. Spoke with patient's caregiver Freeport Lions and gave her instructions for procedure and recommendations by Dr. [name] to decrease feeding by 1/2. She will increase PPI to BID.Rx sent for [employer]. Faxed Prevacid change to [phone] per [name]'s request.

## 2012-09-07 NOTE — Telephone Encounter (Signed)
Patient's caregiver called back. She is concerned about cutting his feedings in 1/2 because patient had surgery on his hands Tues. She is afraid it will impede healing. She is asking to change his 1 can QID to 1/2 can 8 times/day. Is this ok?

## 2012-09-08 ENCOUNTER — Other Ambulatory Visit: Payer: Self-pay | Admitting: *Deleted

## 2012-09-08 ENCOUNTER — Encounter: Payer: Self-pay | Admitting: Pediatrics

## 2012-09-08 ENCOUNTER — Ambulatory Visit (INDEPENDENT_AMBULATORY_CARE_PROVIDER_SITE_OTHER): Payer: Medicaid Other | Admitting: Pediatrics

## 2012-09-08 DIAGNOSIS — K9423 Gastrostomy malfunction: Secondary | ICD-10-CM

## 2012-09-08 DIAGNOSIS — G808 Other cerebral palsy: Secondary | ICD-10-CM

## 2012-09-08 NOTE — Telephone Encounter (Signed)
Spoke with

## 2012-09-08 NOTE — Telephone Encounter (Signed)
37F button plus 53F replacement gastrostomy( they should have it In storage). Thanx ----- Message ----- From: Daphine Deutscher, RN Sent: 09/07/2012 1:09 PM To: Hart Carwin, MD Dr. Royston Bake from Endo just called me. She tried to order the button replacement for [place]. The company does not make a 22 F 2.5 cm button. They have a 20 F and 18 F. What would you like her to order? Rene Kocher

## 2012-09-08 NOTE — Telephone Encounter (Signed)
Spoke with Noreene Larsson and gave her the 74F x 2.5 cm button and 22 F replacement gastrostomy to order for procedure. Scheduled procedure on 09/13/12 at 9:30 AM.
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
## 2012-09-08 NOTE — Telephone Encounter (Signed)
Spoke with [name](caregiver) and gave her Dr. Regino Schultze recommendation and gave her new procedure date and time.

## 2012-09-11 ENCOUNTER — Encounter: Payer: Self-pay | Admitting: Pediatrics

## 2012-09-12 NOTE — H&P (Signed)
Jeffery [name] 11-Jul-1990 MRN [id number]     History of Present Illness:   This is a 22 year old white male with cerebral palsy and seizure disorder. He is noncommunicative and non-ambulatory. He moves around in a wheelchair. Patient has a permanent percutaneous gastrostomy which is his sole source of nutrition. He has been using a Environmental manager button type tube. The last change was in September 2013 using 18 French 2.5 cm replacement gastrostomy. A prior gastrostomy was a 20 French 2.4 cm button. His adapter has been mal-functioning causing tube feedings to leak out. He had recent pneumonia. [facility] He has had swallowing studies. He is currently on Ensure 4 cans a day. He remains in a semi-recumbent position most of the day.      Past Medical History   Diagnosis  Date   .  CP (cerebral palsy), spastic, quadriplegic     .  Epilepsy     .  Osteoporosis     .  Kidney stones     .  Inguinal hernia     .  Undescended testes     .  Seasonal allergies     .  IVH (intraventricular hemorrhage)         Grade IV   .  Hip dislocation, bilateral     .  Pneumonia     .  Seizures     .  Sleep apnea     .  Hx: UTI (urinary tract infection)         Past Surgical History   Procedure  Laterality  Date   .  Excision of moles       .  Peg placement           With multiple changes   .  Gastrostomy tube, change / reposition    12/15/2010           .  Button change    12/15/2010       Procedure: BUTTON CHANGE;  Surgeon: Iva Boop, MD;  Location: [facility];  Service: Endoscopy;  Laterality: N/A;   .  Peg placement    10/07/2011       Procedure: PERCUTANEOUS ENDOSCOPIC GASTROSTOMY (PEG) REPLACEMENT;  Surgeon: Hart Carwin, MD;  Location: [facility];  Service: Endoscopy;  Laterality: N/A;  Needs 18 F 2.5 button ordered-dl       reports that he has never smoked. He has never used smokeless tobacco. He reports  that he does not drink alcohol or use illicit drugs. family history is not on file. He is adopted. Allergies   Allergen  Reactions   .  Adhesive (Tape)     .  Depakote (Divalproex Sodium)         Causes pancreatitis               Review of Systems: Mother denies aspiration, LPR positive for constipation he has been on bowel regimen  The remainder of the 10 point ROS is negative except as outlined in H&P     Physical Exam: General appearance  Well developed, in no distress. He formed upper body. Non communicative   Eyes- non icteric. HEENT normal verbal Mouth no lesions, tongue papillated, no cheilosis. Neck supple without adenopathy, thyroid not enlarged, no carotid bruits, no JVD. Lungs Clear to auscultation bilaterally. Coughs periodically Cor normal S1, normal S2, regular rhythm, no murmur,  quiet precordium. Abdomen: Protuberant with normoactive  bowel sounds, soft and nontender. Button gastrostomy in left upper quadrant, surrounding skin appears normal. There is no leakage from around the tube. The adapter is malfunctioning; feedings seep out when it opens and to a certain degree when it is closed as well. Rectal: Not done. Extremities no pedal edema. Lower extremity contractures. Skin no lesions. Neurological not oriented x 3, Nonverbal. Psychological normal mood and affect.   Assessment and Plan:   Problem #9 22 year old white male with cerebral palsy who is dependent on tube feedings. His gastrostomy has been malfunctioning. His family desires the same type gastrostomy for replacement. We will order a 20 or 22 [place] button gastrostomy, 2.4 or 2.5 cm long and schedule replacement. He will continue on antireflux measures and current tube feedings.     04/21/2012 Jeffery [name]              Revision History

## 2012-09-13 ENCOUNTER — Encounter (HOSPITAL_COMMUNITY)
Admission: RE | Disposition: A | Discharge status: Home / Self Care | Payer: Self-pay | Source: Ambulatory Visit | Attending: Internal Medicine

## 2012-09-13 ENCOUNTER — Ambulatory Visit (HOSPITAL_COMMUNITY)
Admission: RE | Admit: 2012-09-13 | Discharge: 2012-09-13 | Disposition: A | Discharge status: Home / Self Care | Payer: Medicaid Other | Source: Ambulatory Visit | Attending: Internal Medicine | Admitting: Internal Medicine

## 2012-09-13 DIAGNOSIS — G808 Other cerebral palsy: Secondary | ICD-10-CM | POA: Insufficient documentation

## 2012-09-13 DIAGNOSIS — M810 Age-related osteoporosis without current pathological fracture: Secondary | ICD-10-CM | POA: Insufficient documentation

## 2012-09-13 DIAGNOSIS — R633 Feeding difficulties, unspecified: Secondary | ICD-10-CM

## 2012-09-13 DIAGNOSIS — G4730 Sleep apnea, unspecified: Secondary | ICD-10-CM | POA: Insufficient documentation

## 2012-09-13 DIAGNOSIS — G40909 Epilepsy, unspecified, not intractable, without status epilepticus: Secondary | ICD-10-CM | POA: Insufficient documentation

## 2012-09-13 DIAGNOSIS — Y833 Surgical operation with formation of external stoma as the cause of abnormal reaction of the patient, or of later complication, without mention of misadventure at the time of the procedure: Secondary | ICD-10-CM | POA: Insufficient documentation

## 2012-09-13 DIAGNOSIS — K9423 Gastrostomy malfunction: Secondary | ICD-10-CM | POA: Insufficient documentation

## 2012-09-13 DIAGNOSIS — Z8701 Personal history of pneumonia (recurrent): Secondary | ICD-10-CM | POA: Insufficient documentation

## 2012-09-13 DIAGNOSIS — K5900 Constipation, unspecified: Secondary | ICD-10-CM | POA: Insufficient documentation

## 2012-09-13 HISTORY — PX: PEG PLACEMENT: SHX5437

## 2012-09-13 SURGERY — REPLACEMENT, PEG TUBE, WITHOUT ENDOSCOPY

## 2012-09-13 NOTE — Op Note (Addendum)
[facility] [address] [place], [address]   OPERATIVE PROCEDURE REPORT  PATIENT: Jeffery [name], Jeffery [name]  MR#: [id number] BIRTHDATE: 07-20-1990  GENDER: Male ENDOSCOPIST: Hart Carwin, MD REFERRED [id number] Lovell Sheehan, M.D. PROCEDURE DATE:  09/13/2012 PROCEDURE: PEG replacement ASA CLASS:   Class III INDICATIONS:PEG malfunction,  current PEG 35F,button 2.5 cm,, last change 06/2012 MEDICATIONS: None TOPICAL ANESTHETIC:   none  DESCRIPTION OF PROCEDURE:  A history and physical had previously been performed.  The procedure, indications, potential complications , and alternatives were explained to the patient who appeared to understand.  Opportunity for questions was provided and informed consent was obtained.  The existing PEG tube was removed using traction technique.     placed without difficulty22F  [employer]  The G-tube insertion site was then cleansed,  and the external bolster was placed over the tube to secure it to the abdominal wall.  A sterile dressing was then applied, and the procedure terminated.  COMPLICATIONS: There were no complications. ENDOSCOPIC IMPRESSION: Placement of 22 F replacement BS gastrostomy RECOMMENDATIONS: begin feeding today antireflux measures  REPEAT EXAM: as needed   ___________________________________ eSignedHart Carwin, MD 09/13/2012 10:44 AM Revised: 09/13/2012 10:44 AM  CC:

## 2012-09-13 NOTE — Interval H&P Note (Signed)
History and Physical Interval Note:  09/13/2012 10:32 AM  Jeffery [name]  has presented today for surgery, with the diagnosis of peg replacement  The various methods of treatment have been discussed with the patient and family. After consideration of risks, benefits and other options for treatment, the patient has consented to  Procedure(s): PERCUTANEOUS ENDOSCOPIC GASTROSTOMY (PEG) REPLACEMENT (N/A) as a surgical intervention .  The patient's history has been reviewed, patient examined, no change in status, stable for surgery.  I have reviewed the patient's chart and labs.  Questions were answered to the patient's satisfaction.     [name]

## 2012-09-14 ENCOUNTER — Encounter (HOSPITAL_COMMUNITY): Payer: Self-pay | Admitting: Internal Medicine

## 2012-09-14 ENCOUNTER — Other Ambulatory Visit (INDEPENDENT_AMBULATORY_CARE_PROVIDER_SITE_OTHER): Payer: Medicaid Other

## 2012-09-14 ENCOUNTER — Telehealth: Payer: Self-pay | Admitting: *Deleted

## 2012-09-14 ENCOUNTER — Telehealth: Payer: Self-pay | Admitting: Internal Medicine

## 2012-09-14 DIAGNOSIS — R197 Diarrhea, unspecified: Secondary | ICD-10-CM

## 2012-09-14 LAB — CBC WITH DIFFERENTIAL/PLATELET
Basophils Absolute: 0 10*3/uL (ref 0.0–0.1)
Eosinophils Absolute: 0 10*3/uL (ref 0.0–0.7)
HCT: 51.6 % (ref 39.0–52.0)
Lymphs Abs: 1 10*3/uL (ref 0.7–4.0)
MCHC: 34 g/dL (ref 30.0–36.0)
Monocytes Relative: 6.6 % (ref 3.0–12.0)
Neutro Abs: 7.9 10*3/uL — ABNORMAL HIGH (ref 1.4–7.7)
Platelets: 173 10*3/uL (ref 150.0–400.0)
RDW: 13.7 % (ref 11.5–14.6)

## 2012-09-14 NOTE — Telephone Encounter (Signed)
No, it is not supposed to happen unless he is tightening up his abdominal muscles during feeding. Please order G-tube Barium study to confirm position/ placement

## 2012-09-14 NOTE — Telephone Encounter (Signed)
Spoke with patient's caregiver and she states he has had 3 episodes of spitting up black stuff with mucous. No odor to this. She open his tube and it drained normal gastric color fluid. Belly is soft and has bowel sounds. He has also had 2 loose, yellow colored stools that smell terrible. He has been on antibiotics. She has given him gator aid and his seizure medications and he kept it down. Per Dr. [name], continue PPI BID x 1 week, CBC, stool for c. Diff and lactoferrin if caregiver wants too. Dr. [name] feels it could be from the PEG change yesterday. Caregiver notified. She will bring patient for labs and continue PPI at BID x 1 week.

## 2012-09-14 NOTE — Telephone Encounter (Signed)
Spoke with [place] Lions and she states when she gives patient gator aid before she can put the top back on the tube it is pouring out the tube. (she is using gator aid instead of feeding since patient is having loose stool) she wants to know if this is suppose to happen. Please, advise.

## 2012-09-15 ENCOUNTER — Emergency Department (HOSPITAL_COMMUNITY): Payer: Medicaid Other

## 2012-09-15 ENCOUNTER — Encounter: Payer: Medicaid Other | Admitting: Pediatrics

## 2012-09-15 ENCOUNTER — Telehealth: Payer: Self-pay

## 2012-09-15 ENCOUNTER — Inpatient Hospital Stay (HOSPITAL_COMMUNITY)
Admission: EM | Admit: 2012-09-15 | Discharge: 2012-09-22 | DRG: 177 | Disposition: A | Discharge status: Home / Self Care | Payer: Medicaid Other | Attending: Internal Medicine | Admitting: Internal Medicine

## 2012-09-15 ENCOUNTER — Inpatient Hospital Stay (HOSPITAL_COMMUNITY): Payer: Medicaid Other

## 2012-09-15 ENCOUNTER — Encounter (HOSPITAL_COMMUNITY): Payer: Self-pay | Admitting: *Deleted

## 2012-09-15 ENCOUNTER — Telehealth: Payer: Self-pay | Admitting: Internal Medicine

## 2012-09-15 DIAGNOSIS — M810 Age-related osteoporosis without current pathological fracture: Secondary | ICD-10-CM | POA: Diagnosis present

## 2012-09-15 DIAGNOSIS — R7401 Elevation of levels of liver transaminase levels: Secondary | ICD-10-CM | POA: Diagnosis present

## 2012-09-15 DIAGNOSIS — R4701 Aphasia: Secondary | ICD-10-CM | POA: Diagnosis present

## 2012-09-15 DIAGNOSIS — M4140 Neuromuscular scoliosis, site unspecified: Secondary | ICD-10-CM

## 2012-09-15 DIAGNOSIS — IMO0002 Reserved for concepts with insufficient information to code with codable children: Secondary | ICD-10-CM

## 2012-09-15 DIAGNOSIS — G4730 Sleep apnea, unspecified: Secondary | ICD-10-CM | POA: Diagnosis present

## 2012-09-15 DIAGNOSIS — R1310 Dysphagia, unspecified: Secondary | ICD-10-CM | POA: Diagnosis present

## 2012-09-15 DIAGNOSIS — M4120 Other idiopathic scoliosis, site unspecified: Secondary | ICD-10-CM | POA: Diagnosis present

## 2012-09-15 DIAGNOSIS — K4090 Unilateral inguinal hernia, without obstruction or gangrene, not specified as recurrent: Secondary | ICD-10-CM | POA: Diagnosis present

## 2012-09-15 DIAGNOSIS — Z931 Gastrostomy status: Secondary | ICD-10-CM

## 2012-09-15 DIAGNOSIS — H35109 Retinopathy of prematurity, unspecified, unspecified eye: Secondary | ICD-10-CM | POA: Diagnosis present

## 2012-09-15 DIAGNOSIS — R000 Tachycardia, unspecified: Secondary | ICD-10-CM | POA: Diagnosis present

## 2012-09-15 DIAGNOSIS — N200 Calculus of kidney: Secondary | ICD-10-CM | POA: Diagnosis present

## 2012-09-15 DIAGNOSIS — Z79899 Other long term (current) drug therapy: Secondary | ICD-10-CM

## 2012-09-15 DIAGNOSIS — R197 Diarrhea, unspecified: Secondary | ICD-10-CM | POA: Diagnosis present

## 2012-09-15 DIAGNOSIS — R1110 Vomiting, unspecified: Secondary | ICD-10-CM

## 2012-09-15 DIAGNOSIS — G40309 Generalized idiopathic epilepsy and epileptic syndromes, not intractable, without status epilepticus: Secondary | ICD-10-CM | POA: Diagnosis present

## 2012-09-15 DIAGNOSIS — E860 Dehydration: Secondary | ICD-10-CM | POA: Diagnosis present

## 2012-09-15 DIAGNOSIS — K920 Hematemesis: Secondary | ICD-10-CM

## 2012-09-15 DIAGNOSIS — R7402 Elevation of levels of lactic acid dehydrogenase (LDH): Secondary | ICD-10-CM | POA: Diagnosis present

## 2012-09-15 DIAGNOSIS — R259 Unspecified abnormal involuntary movements: Secondary | ICD-10-CM | POA: Diagnosis present

## 2012-09-15 DIAGNOSIS — G808 Other cerebral palsy: Secondary | ICD-10-CM | POA: Diagnosis present

## 2012-09-15 DIAGNOSIS — R633 Feeding difficulties, unspecified: Secondary | ICD-10-CM | POA: Diagnosis present

## 2012-09-15 DIAGNOSIS — Z8701 Personal history of pneumonia (recurrent): Secondary | ICD-10-CM

## 2012-09-15 DIAGNOSIS — T17308A Unspecified foreign body in larynx causing other injury, initial encounter: Secondary | ICD-10-CM | POA: Diagnosis present

## 2012-09-15 DIAGNOSIS — E162 Hypoglycemia, unspecified: Secondary | ICD-10-CM | POA: Diagnosis not present

## 2012-09-15 DIAGNOSIS — J680 Bronchitis and pneumonitis due to chemicals, gases, fumes and vapors: Secondary | ICD-10-CM | POA: Diagnosis present

## 2012-09-15 DIAGNOSIS — J9600 Acute respiratory failure, unspecified whether with hypoxia or hypercapnia: Secondary | ICD-10-CM | POA: Diagnosis not present

## 2012-09-15 DIAGNOSIS — E876 Hypokalemia: Secondary | ICD-10-CM | POA: Diagnosis present

## 2012-09-15 DIAGNOSIS — K6289 Other specified diseases of anus and rectum: Secondary | ICD-10-CM | POA: Diagnosis present

## 2012-09-15 DIAGNOSIS — J9811 Atelectasis: Secondary | ICD-10-CM

## 2012-09-15 DIAGNOSIS — G40209 Localization-related (focal) (partial) symptomatic epilepsy and epileptic syndromes with complex partial seizures, not intractable, without status epilepticus: Secondary | ICD-10-CM

## 2012-09-15 DIAGNOSIS — R7989 Other specified abnormal findings of blood chemistry: Secondary | ICD-10-CM | POA: Diagnosis present

## 2012-09-15 DIAGNOSIS — Y929 Unspecified place or not applicable: Secondary | ICD-10-CM

## 2012-09-15 DIAGNOSIS — G40909 Epilepsy, unspecified, not intractable, without status epilepticus: Secondary | ICD-10-CM | POA: Diagnosis present

## 2012-09-15 DIAGNOSIS — J690 Pneumonitis due to inhalation of food and vomit: Principal | ICD-10-CM

## 2012-09-15 DIAGNOSIS — R5381 Other malaise: Secondary | ICD-10-CM | POA: Diagnosis present

## 2012-09-15 DIAGNOSIS — G40219 Localization-related (focal) (partial) symptomatic epilepsy and epileptic syndromes with complex partial seizures, intractable, without status epilepticus: Secondary | ICD-10-CM | POA: Diagnosis present

## 2012-09-15 LAB — CBC WITH DIFFERENTIAL/PLATELET
Basophils Absolute: 0 10*3/uL (ref 0.0–0.1)
Basophils Relative: 0 % (ref 0–1)
Eosinophils Absolute: 0 10*3/uL (ref 0.0–0.7)
Eosinophils Relative: 0 % (ref 0–5)
HCT: 53.3 % — ABNORMAL HIGH (ref 39.0–52.0)
Hemoglobin: 18.7 g/dL — ABNORMAL HIGH (ref 13.0–17.0)
Lymphocytes Relative: 7 % — ABNORMAL LOW (ref 12–46)
Lymphs Abs: 1 10*3/uL (ref 0.7–4.0)
MCH: 33.9 pg (ref 26.0–34.0)
MCHC: 35.1 g/dL (ref 30.0–36.0)
MCV: 96.7 fL (ref 78.0–100.0)
Monocytes Absolute: 1.1 10*3/uL — ABNORMAL HIGH (ref 0.1–1.0)
Monocytes Relative: 7 % (ref 3–12)
Neutro Abs: 12.5 10*3/uL — ABNORMAL HIGH (ref 1.7–7.7)
Neutrophils Relative %: 85 % — ABNORMAL HIGH (ref 43–77)
Platelets: 181 10*3/uL (ref 150–400)
RBC: 5.51 MIL/uL (ref 4.22–5.81)
RDW: 13.4 % (ref 11.5–15.5)
WBC: 14.6 10*3/uL — ABNORMAL HIGH (ref 4.0–10.5)

## 2012-09-15 LAB — URINE MICROSCOPIC-ADD ON

## 2012-09-15 LAB — URINALYSIS, ROUTINE W REFLEX MICROSCOPIC
Glucose, UA: NEGATIVE mg/dL
Hgb urine dipstick: NEGATIVE
Ketones, ur: 15 mg/dL — AB
Nitrite: NEGATIVE
Protein, ur: 100 mg/dL — AB
Specific Gravity, Urine: 1.036 — ABNORMAL HIGH (ref 1.005–1.030)
Urobilinogen, UA: 1 mg/dL (ref 0.0–1.0)
pH: 5.5 (ref 5.0–8.0)

## 2012-09-15 LAB — LIPASE, BLOOD: Lipase: 31 U/L (ref 11–59)

## 2012-09-15 LAB — COMPREHENSIVE METABOLIC PANEL
ALT: 101 U/L — ABNORMAL HIGH (ref 0–53)
AST: 53 U/L — ABNORMAL HIGH (ref 0–37)
Albumin: 4.8 g/dL (ref 3.5–5.2)
Alkaline Phosphatase: 202 U/L — ABNORMAL HIGH (ref 39–117)
BUN: 18 mg/dL (ref 6–23)
CO2: 32 mEq/L (ref 19–32)
Calcium: 10.6 mg/dL — ABNORMAL HIGH (ref 8.4–10.5)
Chloride: 91 mEq/L — ABNORMAL LOW (ref 96–112)
Creatinine, Ser: 0.64 mg/dL (ref 0.50–1.35)
GFR calc Af Amer: >90 mL/min (ref >90)
GFR calc non Af Amer: >90 mL/min (ref >90)
Glucose, Bld: 86 mg/dL (ref 70–99)
Potassium: 3.1 mEq/L — ABNORMAL LOW (ref 3.5–5.1)
Sodium: 141 mEq/L (ref 135–145)
Total Bilirubin: 1.6 mg/dL — ABNORMAL HIGH (ref 0.3–1.2)
Total Protein: 8.8 g/dL — ABNORMAL HIGH (ref 6.0–8.3)

## 2012-09-15 LAB — CG4 I-STAT (LACTIC ACID): Lactic Acid, Venous: 1.9 mmol/L (ref 0.5–2.2)

## 2012-09-15 LAB — CLOSTRIDIUM DIFFICILE BY PCR: Toxigenic C. Difficile by PCR: NOT DETECTED

## 2012-09-15 MED ORDER — SODIUM CHLORIDE 0.9 % IV SOLN
1500.0000 mg | Freq: Two times a day (BID) | INTRAVENOUS | Status: DC
  Filled 2012-09-15: qty 15

## 2012-09-15 MED ORDER — SODIUM CHLORIDE 0.9 % IV SOLN
INTRAVENOUS | Status: DC
  Administered 2012-09-16 – 2012-09-18 (×2): via INTRAVENOUS

## 2012-09-15 MED ORDER — SODIUM CHLORIDE 0.9 % IV SOLN
1750.0000 mg | Freq: Two times a day (BID) | INTRAVENOUS | Status: DC
  Administered 2012-09-15 – 2012-09-16 (×2): 1750 mg via INTRAVENOUS
  Filled 2012-09-15 (×4): qty 17.5

## 2012-09-15 MED ORDER — PIPERACILLIN-TAZOBACTAM 3.375 G IVPB
3.3750 g | Freq: Three times a day (TID) | INTRAVENOUS | Status: DC
  Administered 2012-09-16 (×2): 3.375 g via INTRAVENOUS
  Filled 2012-09-15 (×5): qty 50

## 2012-09-15 MED ORDER — MORPHINE SULFATE 4 MG/ML IJ SOLN
2.0000 mg | Freq: Once | INTRAMUSCULAR | Status: AC
  Administered 2012-09-15: 2 mg via INTRAVENOUS
  Filled 2012-09-15: qty 1

## 2012-09-15 MED ORDER — ALBUTEROL SULFATE HFA 108 (90 BASE) MCG/ACT IN AERS: 2.0000 | INHALATION_SPRAY | RESPIRATORY_TRACT | Status: DC | PRN

## 2012-09-15 MED ORDER — SODIUM CHLORIDE 0.9 % IJ SOLN
3.0000 mL | Freq: Two times a day (BID) | INTRAMUSCULAR | Status: DC
  Administered 2012-09-16 – 2012-09-20 (×4): 3 mL via INTRAVENOUS
  Administered 2012-09-20: 10 mL via INTRAVENOUS
  Administered 2012-09-21: 30 mL via INTRAVENOUS
  Administered 2012-09-22: 09:00:00 via INTRAVENOUS

## 2012-09-15 MED ORDER — IOHEXOL 300 MG/ML  SOLN
80.0000 mL | Freq: Once | INTRAMUSCULAR | Status: AC | PRN
  Administered 2012-09-15: 80 mL via INTRAVENOUS

## 2012-09-15 MED ORDER — SODIUM CHLORIDE 0.9 % IJ SOLN
3.0000 mL | INTRAMUSCULAR | Status: DC | PRN
  Administered 2012-09-22: 3 mL via INTRAVENOUS

## 2012-09-15 MED ORDER — PANTOPRAZOLE SODIUM 40 MG IV SOLR: 40.0000 mg | INTRAVENOUS | Status: DC

## 2012-09-15 MED ORDER — CALCIUM CARBONATE 1250 MG/5ML PO SUSP
1000.0000 mg | Freq: Two times a day (BID) | ORAL | Status: DC
  Administered 2012-09-16 – 2012-09-22 (×14): 1000 mg via ORAL
  Filled 2012-09-15 (×16): qty 10

## 2012-09-15 MED ORDER — ONDANSETRON HCL 4 MG/2ML IJ SOLN
4.0000 mg | Freq: Once | INTRAMUSCULAR | Status: AC
  Administered 2012-09-15: 4 mg via INTRAVENOUS
  Filled 2012-09-15: qty 2

## 2012-09-15 MED ORDER — SODIUM CHLORIDE 0.9 % IV BOLUS (SEPSIS)
1000.0000 mL | Freq: Once | INTRAVENOUS | Status: AC
  Administered 2012-09-15: 1000 mL via INTRAVENOUS

## 2012-09-15 MED ORDER — DEXTROSE 5 % IV SOLN
1.0000 g | Freq: Once | INTRAVENOUS | Status: AC
  Administered 2012-09-15: 1 g via INTRAVENOUS
  Filled 2012-09-15: qty 10

## 2012-09-15 MED ORDER — VITAMIN C 500 MG PO TABS
500.0000 mg | ORAL_TABLET | Freq: Every day | ORAL | Status: DC
  Administered 2012-09-16 – 2012-09-21 (×7): 500 mg via ORAL
  Filled 2012-09-15 (×10): qty 1

## 2012-09-15 MED ORDER — POTASSIUM CHLORIDE 10 MEQ/100ML IV SOLN
10.0000 meq | INTRAVENOUS | Status: AC
  Administered 2012-09-16 (×3): 10 meq via INTRAVENOUS
  Filled 2012-09-15: qty 300

## 2012-09-15 MED ORDER — PANTOPRAZOLE SODIUM 40 MG IV SOLR
40.0000 mg | Freq: Two times a day (BID) | INTRAVENOUS | Status: DC
  Administered 2012-09-15 – 2012-09-22 (×15): 40 mg via INTRAVENOUS
  Filled 2012-09-15 (×18): qty 40

## 2012-09-15 MED ORDER — ADULT MULTIVITAMIN LIQUID CH
10.0000 mL | Freq: Every day | ORAL | Status: DC
Start: 2012-09-16 — End: 2012-09-22
  Administered 2012-09-16 – 2012-09-22 (×7): 10 mL via ORAL
  Filled 2012-09-15 (×7): qty 10

## 2012-09-15 MED ORDER — PROMETHAZINE HCL 25 MG/ML IJ SOLN
12.5000 mg | INTRAMUSCULAR | Status: AC
  Administered 2012-09-15: 12.5 mg via INTRAVENOUS
  Filled 2012-09-15: qty 1

## 2012-09-15 MED ORDER — ALBUTEROL SULFATE (5 MG/ML) 0.5% IN NEBU: 2.5000 mg | INHALATION_SOLUTION | RESPIRATORY_TRACT | Status: DC | PRN

## 2012-09-15 MED ORDER — POTASSIUM CHLORIDE 10 MEQ/100ML IV SOLN
10.0000 meq | Freq: Once | INTRAVENOUS | Status: AC
  Administered 2012-09-15: 10 meq via INTRAVENOUS
  Filled 2012-09-15: qty 100

## 2012-09-15 MED ORDER — SODIUM CHLORIDE 0.9 % IV SOLN: 250.0000 mL | INTRAVENOUS | Status: DC | PRN

## 2012-09-15 MED ORDER — GABAPENTIN 250 MG/5ML PO SOLN
800.0000 mg | Freq: Three times a day (TID) | ORAL | Status: DC
  Administered 2012-09-16 – 2012-09-22 (×21): 800 mg
  Filled 2012-09-15 (×25): qty 16

## 2012-09-15 MED ORDER — VITAMIN C 500 MG/5ML PO LIQD
500.0000 mg | Freq: Every day | ORAL | Status: DC
  Filled 2012-09-15: qty 5

## 2012-09-15 NOTE — ED Notes (Signed)
Pt had feeding tube placed 2 days ago & since then parents report vomiting, leaking at insertion site, congestion & fever

## 2012-09-15 NOTE — ED Notes (Signed)
Admitting MD at bedside.

## 2012-09-15 NOTE — ED Notes (Signed)
ED MD at bedside attempting IV with ultrasound.

## 2012-09-15 NOTE — ED Notes (Signed)
Pt vomited approx coffee ground looking emesis. Admitting MD paged.

## 2012-09-15 NOTE — Progress Notes (Signed)
Please see S.Gribbin PA full note. Pt seen 3 days ago for change of PEG, he has been vomiting coffee ground material since then I have checked the placement today with normal saline, there is a free flow of saline via [place] syringe by gravity. We plan to obtain a G-tube placement study. R/O G-tube in the colon or in the duodenum in which case it needs to be removed and another G tube placed in radiology. Dr Elnoria Howard on call this weekend.

## 2012-09-15 NOTE — Consult Note (Signed)
[facility] Consult: 4:14 PM 09/15/2012   Referring Provider: ED MD Primary Care Physician:  Della Goo Primary Gastroenterologist:  Dr. [name]  Reason for Consultation:  Feeding difficulties.   HPI: Jeffery [name] is a 22 y.o. 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 He requires gastric feeding.  Button peg placed in past.  Malfunctioned and was replaced in 06/2011.  On 8/13 Dr [name] replaced the feeding tube with standsard, non button type feeding tube.  It had been leaking for last 2 or more weeks. .    Pt s/p  09/05/12 Fractional lengthening of FDS tendons bilaterally, 8 tendons total by Dr Mack Hook 09/09/12 he had refill of his intrathecal baclofen pump.   Family called GI office today.  Pt has been spitting up black material.  However mother able to drain yellowish gastric contents. He tells his foster mom that his belly hurts. From 8/14 on the pt has not been tolerating tube feeds.  The feeds infuse fine but family relays there is abdominal bloating and discomfort afterwards and the pt starts heaving until he finally vomits dark material.  There is not gross blood in the vomit.  Pt does not generally regurgitate or c/o pain In ED has temp of 99.7.  Xray showing no obstruction.   His LFTs are elevated, his lipase is normal. Do not have old LFTs for comparison.  CT of abdomen is planned after contrast study of feeding tube has been completed. .     Past Medical History  Diagnosis Date  . CP (cerebral palsy), spastic, quadriplegic   . Epilepsy   . Osteoporosis   . Kidney stones   . Inguinal hernia   . Undescended testes   . Seasonal allergies   . IVH (intraventricular hemorrhage)     Grade IV  . Hip dislocation, bilateral   . Pneumonia   . Seizures   . Hx: UTI (urinary tract infection)   . Dysphagia   . Otitis media   . Retinopathy of prematurity   .  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 Neuromuscular scoliosis   . Osteoporosis   . Complex partial seizures   . Generalized convulsive epilepsy without mention of intractable epilepsy   . Epilepsy   . Sinus bradycardia     HR drops to 38-40 while sleeping  . Sleep apnea     BiPAP  . Blister of right heel     fluid filled; origin unknown    Past Surgical History  Procedure Laterality Date  . Excision of moles    . Peg placement      With multiple changes  . Gastrostomy tube, change / reposition  12/15/2010       . Button change  12/15/2010    Procedure: BUTTON CHANGE;  Surgeon: Iva Boop, MD;  Location: [facility];  Service: Endoscopy;  Laterality: N/A;  . Peg placement  10/07/2011    Procedure: PERCUTANEOUS ENDOSCOPIC GASTROSTOMY (PEG) REPLACEMENT;  Surgeon: Hart Carwin, MD;  Location: [facility];  Service: Endoscopy;  Laterality: N/A;  Needs 18 F 2.5 button ordered-dl  . Inguinal hernia repair Bilateral 1992  . Retinopathy of prematurity surgery  1992  . Achilles tendon lengthening  12/1998  . Soft tissue releases  wrists and fingers  12/1998  . Intrathecal baclofen pump placement  07/25/2000  . Peg placement N/A 06/05/2012    Procedure: PERCUTANEOUS ENDOSCOPIC GASTROSTOMY (PEG) REPLACEMENT;  Surgeon: Hart Carwin,  MD;  Location: [facility];  Service: Endoscopy;  Laterality: N/A;  button 76fr.2.5cm  . Eye surgery Bilateral     Retinal   . Back surgery      Harrington Rods in back needs to be log rolled  . Tendon repair Bilateral 09/05/2012    Procedure: LENGTHENING OF DIGITAL FLEXOR TENDONS BILTERAL HANDS;  Surgeon: Jodi Marble, MD;  Location: [facility];  Service: Orthopedics;  Laterality: Bilateral;  . Hand surgery Bilateral Aug. 2014  . Peg placement N/A 09/13/2012    Procedure: PERCUTANEOUS ENDOSCOPIC GASTROSTOMY (PEG) REPLACEMENT;  Surgeon: Hart Carwin, MD;  Location: [facility];  Service: Endoscopy;  Laterality: N/A;    Prior to Admission medications    Medication Sig Start Date End Date Taking? Authorizing Provider  albuterol (PROVENTIL HFA;VENTOLIN HFA) 108 (90 BASE) MCG/ACT inhaler Inhale 2 puffs into the lungs every 4 (four) hours as needed for shortness of breath.   Yes Historical Provider, MD  albuterol (PROVENTIL) (2.5 MG/3ML) 0.083% nebulizer solution Take 2.5 mg by nebulization every 4 (four) hours as needed for wheezing or shortness of breath.    Yes Historical Provider, MD  Ascorbic Acid (VITAMIN C) 500 MG/5ML LIQD 500 mg by PEG Tube route at bedtime.    Yes Historical Provider, MD  baclofen (GABLOFEN) 40000 MCG/20ML SOLN 326 mcg by Intrathecal route continuous.  05/04/12  Yes Deetta Perla, MD  calcium carbonate, dosed in mg elemental calcium, 1250 MG/5ML 1,000 mg of elemental calcium by PEG Tube route 2 (two) times daily. 4 cc via peg tube   Yes Historical Provider, MD  Diazepam (DIASTAT RE) Place 12.5 mg rectally as needed (grand mal seizure lasting >5 min or compromising).    Yes Historical Provider, MD  furosemide (LASIX) 20 MG tablet 20 mg by PEG Tube route 4 (four) times daily as needed for fluid or edema.    Yes Historical Provider, MD  gabapentin (NEURONTIN) 250 MG/5ML solution 800 mg by PEG Tube route 3 (three) times daily. 16 mL in tube   Yes Historical Provider, MD  lansoprazole (PREVACID SOLUTAB) 30 MG disintegrating tablet Increase to 30 mg BID until PEG replacement 09/07/12  Yes Hart Carwin, MD  levETIRAcetam (KEPPRA) 100 MG/ML solution 1,750 mg by PEG Tube route 2 (two) times daily. 17.5 mls   Yes Historical Provider, MD  Multiple Vitamin (MULTIVITAMIN) LIQD 10 mLs by PEG Tube route daily.   Yes Historical Provider, MD  Multiple Vitamins-Minerals (ZINC PO) 5 mLs by PEG Tube route daily. 244 mg / 5 mL zinc solution   Yes Historical Provider, MD  mupirocin ointment (BACTROBAN) 2 % Apply 1 application topically as needed (to G-T site). 09/07/12  Yes Hart Carwin, MD  OnabotulinumtoxinA (BOTOX IM) Inject 1 mL into the muscle  every 3 (three) months.   Yes Historical Provider, MD  OXYGEN-HELIUM IN Inhale into the lungs as needed.   Yes Historical Provider, MD  polyethylene glycol (MIRALAX / GLYCOLAX) packet 17 g by PEG Tube route daily as needed (Constipation).    Yes Historical Provider, MD    Scheduled Meds: . botulinum toxin Type A  300 Units Intramuscular Once  . ondansetron (ZOFRAN) IV  4 mg Intravenous Once   Infusions: . [START ON 09/16/2012] levETIRAcetam Stopped (09/15/12 1356)   PRN Meds:    Allergies as of 09/15/2012 - Review Complete 09/15/2012  Allergen Reaction Noted  . Adhesive [tape]  04/21/2012  . Depakote [divalproex sodium]  05/20/2010    Family History  Problem Relation Age of Onset  . Adopted: Yes    History   Social History  . Marital Status: Single    Spouse Name: N/A    Number of Children: N/A  . Years of Education: N/A   Occupational History  . Student     Social History Main Topics  . Smoking status: Never Smoker   . Smokeless tobacco: Never Used  . Alcohol Use: No  . Drug Use: No  . Sexual Activity: No   Other Topics Concern  . Not on file   Social History Narrative  . No narrative on file    REVIEW OF SYSTEMS: Per HPI.  No urinary changes, no seizures.  + sweats.  No cough or dyspnea.  No limb edema  PHYSICAL EXAM: Vital signs in last 24 hours: Temp:  [99.4 F (37.4 C)-99.7 F (37.6 C)] 99.4 F (37.4 C) (08/15 1302) Pulse Rate:  [85-104] 85 (08/15 1500) Resp:  [14-37] 20 (08/15 1500) BP: (109-131)/(59-96) 109/59 mmHg (08/15 1500) SpO2:  [89 %-97 %] 95 % (08/15 1500) FiO2 (%):  [100 %] 100 % (08/15 1200) Weight:  [54.432 kg (120 lb)] 54.432 kg (120 lb) (08/15 0935)  General: deformed young WM with contractures of trunk and limbs Head:  No facial edema  Eyes:  No icterus, no pallor Ears:  Unable to formally assess hearing but is able to respond to voice  Nose:  No discharge Mouth:  Moist tongue and lips.  Would not open mouth Neck:  No mass  or JVD Lungs:  Clear in front.  No cough or dyspnea.   Heart: RRR.   Abdomen:  Soft, hypoactive BS.  Slightly distended but soft,  Not obviously tender.   Rectal: not done   Musc/Skeltl:  Atrophy and contracturing of limbs, especially of the legs.   Extremities:  No pedal edema.  Feet are warm  Neurologic:  Alert.  Not following commands.  Limited movement of limbs.   Skin:  No rash or sors Tattoos:  none Nodes:  No cervical adenopathy   Psych:  Unable to assess except he is not agitated and is laying in stretche quietly.    Total I/O In: 1100 [I.V.:1100] Out: 50 [Emesis/NG output:50]  LAB RESULTS:  Recent Labs  09/14/12 1044 09/15/12 0939  WBC 9.6 14.6*  HGB 17.6* 18.7*  HCT 51.6 53.3*  PLT 173.0 181   BMET Lab Results  Component Value Date   NA 141 09/15/2012   NA 142 09/05/2012   NA 137 07/17/2007   K 3.1* 09/15/2012   K 4.0 09/05/2012   K 4.1 07/17/2007   CL 91* 09/15/2012   CL 102 07/17/2007   CO2 32 09/15/2012   CO2 27 07/17/2007   GLUCOSE 86 09/15/2012   GLUCOSE 103* 09/05/2012   GLUCOSE 95 07/17/2007   BUN 18 09/15/2012   BUN 14 07/17/2007   CREATININE 0.64 09/15/2012   CREATININE 0.66 07/17/2007   CALCIUM 10.6* 09/15/2012   CALCIUM 9.5 07/17/2007   LFT  Recent Labs  09/15/12 0939  PROT 8.8*  ALBUMIN 4.8  AST 53*  ALT 101*  ALKPHOS 202*  BILITOT 1.6*    RADIOLOGY STUDIES: Dg Chest Portable 1 View 09/15/2012   *RADIOLOGY REPORT*  Clinical Data: Fever  PORTABLE CHEST - 1 VIEW  Comparison: 09/05/2012  Findings: The previously seen left-sided central venous line has been removed in the interval.  Postsurgical changes are again seen. The lungs are clear bilaterally.  The cardiac shadow is within  normal limits.  IMPRESSION: No acute abnormality noted.   Original Report Authenticated By: Alcide Clever, M.D.   Dg Abd Portable 1v 09/15/2012   *RADIOLOGY REPORT*  Clinical Data: Vomiting.  Fever.  PORTABLE ABDOMEN - 1 VIEW  Comparison: CT, 02/08/2006  Findings: There is a  normal bowel gas pattern.  No evidence of obstruction.  The soft tissues are unremarkable.  Long spine stabilization rods are noted with the bony fusion spanning the visualized spine.  This is stable.  A spine stimulator lies in the right lower quadrant with its catheter extending into the lumbar spinal canal and superiorly above the included field of view in the thoracic spinal canal.  This is also stable.  IMPRESSION: No acute findings.  No evidence of obstruction.   Original Report Authenticated By: Amie Portland, M.D.    ENDOSCOPIC STUDIES:  Peg replacement on 8/13 at [facility] DESCRIPTION OF PROCEDURE: A history and physical had previously  been performed. The procedure, indications, potential  complications , and alternatives were explained to the patient who  appeared to understand. Opportunity for questions was provided and  informed consent was obtained. The existing PEG tube was removed  using traction technique. placed without difficulty22F [employer] The G-tube insertion site was then cleansed, and the  external bolster was placed over the tube to secure it to the  abdominal wall. A sterile dressing was then applied, and the  procedure terminated.  COMPLICATIONS: There were no complications.  ENDOSCOPIC IMPRESSION:  Placement of 22 F replacement BS gastrostomy  RECOMMENDATIONS:  begin feeding today  antireflux measures     IMPRESSION: * nausea and vomiting, black material.  Has required tube feedings for many years.  Button type peg replaced with conventional peg 8/13 because the old tube was leaking.  Now with feeding associated vomiting and abdominal pain  No evidence of GOO on plain abdominal films.  Is the tube maal positioned or malfunctioning? LFTs are elevated:  ? Does he have GB disease?  CT scan is pending.   Hx of kidney stones but U/A is negative.  *  Recent bil UE tendon surgery. *  Cerebral Palsy with spasticity.   PLAN: *  Obtain contrast study of PEG,  then CT abdomen.  *  Would empirically give abx until we rule out infections, as his WBC count is elevated.  He is at risk for aspiration PNA, so far the cxr shows no infiltrates.  *  Recheck LFTs in AM, hepatitis acute panel is ordered.    LOS: 0 days   Jennye Moccasin  09/15/2012, 4:14 PM Pager: [phone]  Please see separate note.  KUB with gastrografin injection shows good placement of G-tube when retention disc is at #3 cm mark. I believe the balloon slipped into the duodenum  And caused intermittent obstruction,because it was on #7cm when pt arrived in ED. We have ordered CT scan to r/o other active process. I have discussed it with  [place] [facility], the caretaker.

## 2012-09-15 NOTE — ED Notes (Signed)
Family at bedside. 

## 2012-09-15 NOTE — ED Provider Notes (Signed)
Angiocath insertion Performed by: Redgie Grayer, DAVID  Consent: Verbal consent obtained. Risks and benefits: risks, benefits and alternatives were discussed Time out: Immediately prior to procedure a "time out" was called to verify the correct patient, procedure, equipment, support staff and site/side marked as required.  Preparation: Patient was prepped and draped in the usual sterile fashion.  Vein Location: RUE vien  Ultrasound Guided  Gauge: 20  No blood return.  IV flushed w/o difficulty.   Patient tolerance: Patient tolerated the procedure well with no immediate complications.   Patient comanage with me. 22 year old male with history of cerebral palsy status post PEG tube replacement 2 days ago by Dr. Dickie La. Patient presents with history of fever at home, emesis which is dark brown in color, and mom is concerned about possible pneumonia. Initial pulse ox is in the 80s however it improved with nonrebreather mask. IV access was difficult to establish because of contractures, bilateral upper extremity splints secondary to recent tendon release performed August 5 by Dr. Mack Hook, and neck contractures. I attempted an ultrasound-guided IV the right upper extremity. No blood return on aspiration however the IV flushed without difficulty. IV team has been confluent as well as IR for placement of a PIC line. Patient's mom is a Engineer, civil (consulting) and she is well informed and knowledgeable about her son's condition and care. Ultimate disposition will be pending results from studies. The patient will be admitted.  Darlys Gales, MD 09/15/12 [phone]

## 2012-09-15 NOTE — ED Notes (Signed)
Dr. Clyde Lundborg informed & aware of coffee ground emesis of & pt's mother requesting an abd Ct scan to be done. Awaiting GI doctor, order to give protonix IV now

## 2012-09-15 NOTE — ED Notes (Signed)
Pt had focal (facial) seizure lasting approx 45 seconds. Pt became apneic, hypoxic, cyanotic during seizure which continued after he stopped seizing. Pt bagged 2-3 minutes until breathing on own.  Placed on NRB oxygen. Dr. Clyde Lundborg paged & notified of events. Pt to have bed assignment changed.

## 2012-09-15 NOTE — ED Notes (Signed)
IV team at bedside. Unable to obtain IV access. Hermelinda Medicus, PA informed & aware IV & blood work not obtained.

## 2012-09-15 NOTE — ED Provider Notes (Signed)
2:24 PM VSS, pt to be admitted.  Access obtained.  Consult placed for PICC placement.  Mom aware of plan.    Darlys Gales, MD 09/15/12 1425

## 2012-09-15 NOTE — Telephone Encounter (Signed)
Noted. I did not receive a phone call today. TG

## 2012-09-15 NOTE — ED Notes (Signed)
IV team paged for IV line & blood draw. Pt with BUE, BLE contractures & BUE are in casts up to elbow.

## 2012-09-15 NOTE — ED Notes (Signed)
Report given to floor nurse. Pt to have CT scan prior to going to room.

## 2012-09-15 NOTE — ED Provider Notes (Addendum)
Dr. [name] has seen, adjusted adn replaced tube.  Tube study is good.  She still recommends a CT of abd due to the coffee ground emesis. Dr. [name] has placed order. Gavin Pound. Oletta Lamas, MD 09/15/12 1743  Gavin Pound. Thaila Bottoms, MD 09/15/12 1745

## 2012-09-15 NOTE — H&P (Signed)
Date: 09/15/2012               Patient Name:  Jeffery [name] MRN: [id number]  DOB: [birth year] Age / Sex: 22 y.o., male   PCP: Mia Creek, MD         Medical Service: Internal Medicine Teaching Service         Attending Physician: Dr. Darlys Gales, MD    First Contact: Dr. Yetta Barre Pager: [phone]  Second Contact: Dr. Verdie Mosher Pager: [phone]       After Hours (After 5p/  First Contact Pager: [phone]  weekends / holidays): Second Contact Pager: [phone]   Chief Complaint: Dark brown emesis, low grade fever (@home ), lethargy  History of Present Illness: [name] is a 22 y.o. male w/ PMHx of Cerebral Palsy (spastic), epilepsy, and osteoporosis, presents to the ED w/ legal guardian w/ complaints of dark emesis, low grade fever, and lethargy for the past 2 days. The patient is non-verbal and immobile 2/2 his CP and all history obtained from his legal guardian. The patient has had a PEG tube since 2000 and recently had it replaced on 09/13/12 at [facility]. Since that time, his guardian reported that he has had vomiting w/ almost every tube feed (15-20 times in the last 2 days), resulting in a dark brown/maroon colored emesis. It was also reported that he has been lethargic, w/ a low grade fever, which is very different from his baseline. The guardian is convinced that his symptoms are due to his new PEG b/c it is much longer than his previous one, which was a much smaller, pediatric "botton" type. She also notes that when she feeds him through the PEG, he seems to be in pain, opening his eyes widely. She also reports that he has has some recent loose stools, a light brown/golden color. C. Diff was negative. The guardian also frequently checks his SpO2 at home and his baseline is 90-92%, but in the last day or so, he has been close to 86-88%. The patient was also reported to have desatted in the ED to 88% and was placed on 4L O2 via [place], resulting in 95-96% SpO2. The patient has a known history of  aspiration pneumonia in the past.  On arrival to the ED, the patient was noted to have elevated WBCs to 14.9. Hb/Hct were 18.7 and 53.3, respectively. Lactate was 1.90. The patient was also found to have Abnormal LFTs; AST 53, ALT 101, TP 8.8, Tbili 1.6, Albumin 4.8, and Alk phos 202. The guardian reports that he has never had liver issues in the past. CXR and Abd XR were performed in the ED, both were normal.  Patient was also reported to have had a recent seizure today, mild in nature w/ UE and head tonic-clonic movements. The patient is well controlled on Keppra, however the patient has recently been vomiting after every PEG feeding, including after medication administration.  Meds: Current Facility-Administered Medications  Medication Dose Route Frequency Provider Last Rate Last Dose  . botulinum toxin Type A (BOTOX) injection 300 Units  300 Units Intramuscular Once Levert Feinstein, MD      . Melene Muller ON 09/16/2012] levETIRAcetam (KEPPRA) 1,750 mg in sodium chloride 0.9 % 100 mL IVPB  1,750 mg Intravenous Q12H Jamesetta Orleans Lawyer, PA-C   1,750 mg at 09/15/12 1234  . ondansetron (ZOFRAN) injection 4 mg  4 mg Intravenous Once Christopher W Lawyer, PA-C      . potassium chloride 10 mEq in 100  mL IVPB  10 mEq Intravenous Once Carlyle Dolly, PA-C 100 mL/hr at 09/15/12 1458 10 mEq at 09/15/12 1458   Current Outpatient Prescriptions  Medication Sig Dispense Refill  . albuterol (PROVENTIL HFA;VENTOLIN HFA) 108 (90 BASE) MCG/ACT inhaler Inhale 2 puffs into the lungs every 4 (four) hours as needed for shortness of breath.      Marland Kitchen albuterol (PROVENTIL) (2.5 MG/3ML) 0.083% nebulizer solution Take 2.5 mg by nebulization every 4 (four) hours as needed for wheezing or shortness of breath.       . Ascorbic Acid (VITAMIN C) 500 MG/5ML LIQD 500 mg by PEG Tube route at bedtime.       . baclofen (GABLOFEN) 40000 MCG/20ML SOLN 326 mcg by Intrathecal route continuous.       . calcium carbonate, dosed in mg elemental  calcium, 1250 MG/5ML 1,000 mg of elemental calcium by PEG Tube route 2 (two) times daily. 4 cc via peg tube      . Diazepam (DIASTAT RE) Place 12.5 mg rectally as needed (grand mal seizure lasting >5 min or compromising).       . furosemide (LASIX) 20 MG tablet 20 mg by PEG Tube route 4 (four) times daily as needed for fluid or edema.       . gabapentin (NEURONTIN) 250 MG/5ML solution 800 mg by PEG Tube route 3 (three) times daily. 16 mL in tube      . lansoprazole (PREVACID SOLUTAB) 30 MG disintegrating tablet Increase to 30 mg BID until PEG replacement  60 tablet  0  . levETIRAcetam (KEPPRA) 100 MG/ML solution 1,750 mg by PEG Tube route 2 (two) times daily. 17.5 mls      . Multiple Vitamin (MULTIVITAMIN) LIQD 10 mLs by PEG Tube route daily.      . Multiple Vitamins-Minerals (ZINC PO) 5 mLs by PEG Tube route daily. 244 mg / 5 mL zinc solution      . mupirocin ointment (BACTROBAN) 2 % Apply 1 application topically as needed (to G-T site).  22 g  1  . OnabotulinumtoxinA (BOTOX IM) Inject 1 mL into the muscle every 3 (three) months.      Docia Barrier IN Inhale into the lungs as needed.      . polyethylene glycol (MIRALAX / GLYCOLAX) packet 17 g by PEG Tube route daily as needed (Constipation).         Allergies: Allergies as of 09/15/2012 - Review Complete 09/15/2012  Allergen Reaction Noted  . Adhesive [tape]  04/21/2012  . Depakote [divalproex sodium]  05/20/2010   Past Medical History  Diagnosis Date  . CP (cerebral palsy), spastic, quadriplegic   . Epilepsy   . Osteoporosis   . Kidney stones   . Inguinal hernia   . Undescended testes   . Seasonal allergies   . IVH (intraventricular hemorrhage)     Grade IV  . Hip dislocation, bilateral   . Pneumonia   . Seizures   . Hx: UTI (urinary tract infection)   . Dysphagia   . Otitis media   . Retinopathy of prematurity   . 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 Neuromuscular scoliosis   . Osteoporosis   . Complex partial  seizures   . Generalized convulsive epilepsy without mention of intractable epilepsy   . Epilepsy   . Sinus bradycardia     HR drops to 38-40 while sleeping  . Sleep apnea     BiPAP  . Blister of right heel  fluid filled; origin unknown   Past Surgical History  Procedure Laterality Date  . Excision of moles    . Peg placement      With multiple changes  . Gastrostomy tube, change / reposition  12/15/2010       . Button change  12/15/2010    Procedure: BUTTON CHANGE;  Surgeon: Iva Boop, MD;  Location: [facility];  Service: Endoscopy;  Laterality: N/A;  . Peg placement  10/07/2011    Procedure: PERCUTANEOUS ENDOSCOPIC GASTROSTOMY (PEG) REPLACEMENT;  Surgeon: Hart Carwin, MD;  Location: [facility];  Service: Endoscopy;  Laterality: N/A;  Needs 18 F 2.5 button ordered-dl  . Inguinal hernia repair Bilateral 1992  . Retinopathy of prematurity surgery  1992  . Achilles tendon lengthening  12/1998  . Soft tissue releases  wrists and fingers  12/1998  . Intrathecal baclofen pump placement  07/25/2000  . Peg placement N/A 06/05/2012    Procedure: PERCUTANEOUS ENDOSCOPIC GASTROSTOMY (PEG) REPLACEMENT;  Surgeon: Hart Carwin, MD;  Location: [facility];  Service: Endoscopy;  Laterality: N/A;  button 33fr.2.5cm  . Eye surgery Bilateral     Retinal   . Back surgery      Harrington Rods in back needs to be log rolled  . Tendon repair Bilateral 09/05/2012    Procedure: LENGTHENING OF DIGITAL FLEXOR TENDONS BILTERAL HANDS;  Surgeon: Jodi Marble, MD;  Location: [facility] OR;  Service: Orthopedics;  Laterality: Bilateral;  . Hand surgery Bilateral Aug. 2014  . Peg placement N/A 09/13/2012    Procedure: PERCUTANEOUS ENDOSCOPIC GASTROSTOMY (PEG) REPLACEMENT;  Surgeon: Hart Carwin, MD;  Location: [facility];  Service: Endoscopy;  Laterality: N/A;   Family History  Problem Relation Age of Onset  . Adopted: Yes   History   Social History  . Marital Status: Single    Spouse Name: N/A      Number of Children: N/A  . Years of Education: N/A   Occupational History  . Student     Social History Main Topics  . Smoking status: Never Smoker   . Smokeless tobacco: Never Used  . Alcohol Use: No  . Drug Use: No  . Sexual Activity: No   Other Topics Concern  . Not on file   Social History Narrative  . No narrative on file    Review of Systems: (reported from the guardian) General: Positive for fever, fatigue, and diaphoresis. Denies chills.  HEENT: Denies eye redness, hearing loss, congestion, rhinorrhea, sneezing, or mouth sores. Respiratory: Positive for cough (chronic, intermittent). Denies SOB and wheezing.   Cardiovascular: Denies leg swelling.  Gastrointestinal: Positive for nausea, vomiting (dark brown/maroon), abdominal pain, and diarrhea. Denies constipation, blood in stool and abdominal distention.  Genitourinary: Denies frequency, hematuria, and difficulty urinating.  Musculoskeletal: Denies joint swelling.  Skin: Denies pallor, rash and wounds.  Neurological: Positive for seizures.  Hematological: Denies adenopathy, easy bruising, personal or family bleeding history.   Physical Exam: Blood pressure 114/68, pulse 102, temperature 99.4 F (37.4 C), temperature source Rectal, resp. rate 14, height 4\' 11"  (1.499 m), weight 120 lb (54.432 kg), SpO2 95.00%. Filed Vitals:   09/15/12 1300 09/15/12 1302 09/15/12 1400 09/15/12 1500  BP: 131/83  114/68 109/59  Pulse: 94  102 85  Temp:  99.4 F (37.4 C)    TempSrc:  Rectal    Resp: 25  14 20   Height:      Weight:      SpO2: 93%  95% 95%   General:  Vital signs reviewed.  Patient has Cerebral Palsy, is nonverbal and immobile.  Head: Normocephalic and atraumatic. Mildly diaphoretic. Nose: No erythema or drainage noted.  Turbinates normal. Mouth: No erythema, exudates, sores, or ulcerations. Moist mucus membranes. Eyes: PERRL, EOMI, conjunctivae normal, No scleral icterus.  Neck: Supple, trachea midline, no  JVD, masses, thyromegaly, or carotid bruit present.  Cardiovascular: RRR, S1 normal, S2 normal, no murmurs, gallops, or rubs. Pulmonary/Chest: Normal respiratory effort, CTAB, no wheezes, rales, or rhonchi.  Abdominal: Soft, non-tender, non-distended, bowel sounds are normal, no masses, organomegaly, or guarding present. PEG in place. Site looks clean w/out leak. Musculoskeletal: Patient w/ structural abnormalities involving the extremities. Both LE w/ contractures, UE's currently bandaged 2/2 recent tendon release surgery. Extremities: No swelling or edema,  pulses symmetric and intact bilaterally. No cyanosis or clubbing. Extremity contractures (see above). Neurological: Patient w/ cerebral palsy. Currently somnolent. Unable to properly assess. Skin: Warm, dry and intact. Mildly diaphoretic. No rashes or erythema. Psychiatric: Unable to assess.  Lab results: Basic Metabolic Panel:  Recent Labs  16/10/96 0939  NA 141  K 3.1*  CL 91*  CO2 32  GLUCOSE 86  BUN 18  CREATININE 0.64  CALCIUM 10.6*   Liver Function Tests:  Recent Labs  09/15/12 0939  AST 53*  ALT 101*  ALKPHOS 202*  BILITOT 1.6*  PROT 8.8*  ALBUMIN 4.8    Recent Labs  09/15/12 0939  LIPASE 31   No results found for this basename: AMMONIA,  in the last 72 hours CBC:  Recent Labs  09/14/12 1044 09/15/12 0939  WBC 9.6 14.6*  NEUTROABS 7.9* 12.5*  HGB 17.6* 18.7*  HCT 51.6 53.3*  MCV 97.2 96.7  PLT 173.0 181   Urinalysis:  Recent Labs  09/15/12 1300  COLORURINE AMBER*  LABSPEC 1.036*  PHURINE 5.5  GLUCOSEU NEGATIVE  HGBUR NEGATIVE  BILIRUBINUR SMALL*  KETONESUR 15*  PROTEINUR 100*  UROBILINOGEN 1.0  NITRITE NEGATIVE  LEUKOCYTESUR TRACE*   Imaging results:  Dg Chest Portable 1 View  09/15/2012   *RADIOLOGY REPORT*  Clinical Data: Fever  PORTABLE CHEST - 1 VIEW  Comparison: 09/05/2012  Findings: The previously seen left-sided central venous line has been removed in the interval.   Postsurgical changes are again seen. The lungs are clear bilaterally.  The cardiac shadow is within normal limits.  IMPRESSION: No acute abnormality noted.   Original Report Authenticated By: Alcide Clever, M.D.   Dg Abd Portable 1v  09/15/2012   *RADIOLOGY REPORT*  Clinical Data: Vomiting.  Fever.  PORTABLE ABDOMEN - 1 VIEW  Comparison: CT, 02/08/2006  Findings: There is a normal bowel gas pattern.  No evidence of obstruction.  The soft tissues are unremarkable.  Long spine stabilization rods are noted with the bony fusion spanning the visualized spine.  This is stable.  A spine stimulator lies in the right lower quadrant with its catheter extending into the lumbar spinal canal and superiorly above the included field of view in the thoracic spinal canal.  This is also stable.  IMPRESSION: No acute findings.  No evidence of obstruction.   Original Report Authenticated By: Amie Portland, M.D.   Other results: EKG: No previous EKG.  Assessment & Plan by Problem: 22 y.o. male w/ PMHx of Cerebral Palsy (spastic), epilepsy, and osteoporosis, presents to the ED w/ legal guardian w/ complaints of dark emesis, low grade fever, and lethargy for the past 2 days.  #Dark brown emesis w/ elevated LFT's- Patient w/ recent vomiting for the past  2 days, s/p PEG replacement on 09/13/12. Emesis appears dark brown and slightly maroon and occurs after every tube feed. Most likely 2/2 gastric mucosal irritation d/t longer PEG than previous, however other issues cannot be ruled out such as gastric ulcer, or cholecystitis. -Admitted to step-down unit. -GI was consulted. Ordered gastrograffin study through PEG tube to determine placement of tube. If in the duodenum, it will need to be replaced.  -CT abdomen ordered as per GI to r/o cholecystitis. -Recheck LFTs in the AM -Hepatitis panel ordered, results pending.  -FOBT pending -Started on Protonix 40 mg IV bid -Given Zofran 4 mg prn  -Started on NS @ 75 ml/hr  #Possible  aspiration pneumonia- Patient has a known history of aspiration pneumonia in the past. His guardian records his SpO2 at home and reports a recent decrease in O2 sat in the last day. In ED, SpO2 was 88%, placed on 4L O2 via [place], now satting in the mid 90's. CXR clear, showing no acute cardiopulmonary disease, however, the patient has leukocytosis, low grade fever, and recent frequent vomiting.  -Given Rocephin 1g. IV in the ED -Consider starting Abx for aspiration pna after GI issues better understood.  -Repeat CXR in AM  #Epilepsy- Patient has a known history of seizure disorder. The guardian claims that his seizures are well controlled on Keppra 1,750 mg by PEG Tube bid. He only has about 4 seizures per year. He did have a reported seizure today, resulting in UE and head convulsions, most likely d/t his recent vomiting after every tube feed, including medication administration. The patient is not known to be post-ictal after his seizures and this is most likely not responsible for his recent fatigue.  -Continued home dose Keppra IVPB -Neuro and neurovascular checks q4h  #Cerebral Palsy (spastic type)- Patient is nonverbal and immobile 2/2 quadriplegia. He has contractures in all extremities and does respond visually to pain. 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 He is usually awake and alert, but he presents to the ED much more lethargic than his baseline. He receives Botox injections infrequently for spasticity, baclofen intrathecally, and neurontin. -Stable at this time.  Dispo: Disposition is deferred at this time, awaiting improvement of current medical problems. Anticipated discharge in approximately 3-4 day(s).   The patient does have a current PCP Mia Creek, MD) and does need an [facility] hospital follow-up appointment after discharge.  The patient does not have transportation limitations that hinder transportation to clinic  appointments.  Signed: Lars Masson, MD 09/15/2012, 3:17 PM  Pager: [phone]

## 2012-09-15 NOTE — ED Provider Notes (Signed)
CSN: [id number]     Arrival date & time 09/15/12  0909 History     First MD Initiated Contact with Patient 09/15/12 [phone]     Chief Complaint  Patient presents with  . Fever  . Emesis   (Consider location/radiation/quality/duration/timing/severity/associated sxs/prior Treatment) HPI Patient presents to the emergency department with vomiting and PEG tube issues.  Patient has cerebral palsy and is noncommunicative.  The mother gives all the history mother states that these symptoms started 2 days, ago.  Patient had PEG tube replaced on the 13th.  Patient had a rectal temperature 100.3 prior to arrival.  Mother, states that she's had loose stools, as well.  Stool sample was sent looking for C. difficile.  Patient has not been able to tolerate his medications Past Medical History  Diagnosis Date  . CP (cerebral palsy), spastic, quadriplegic   . Epilepsy   . Osteoporosis   . Kidney stones   . Inguinal hernia   . Undescended testes   . Seasonal allergies   . IVH (intraventricular hemorrhage)     Grade IV  . Hip dislocation, bilateral   . Pneumonia   . Seizures   . Hx: UTI (urinary tract infection)   . Dysphagia   . Otitis media   . Retinopathy of prematurity   . 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 Neuromuscular scoliosis   . Osteoporosis   . Complex partial seizures   . Generalized convulsive epilepsy without mention of intractable epilepsy   . Epilepsy   . Sinus bradycardia     HR drops to 38-40 while sleeping  . Sleep apnea     BiPAP  . Blister of right heel     fluid filled; origin unknown   Past Surgical History  Procedure Laterality Date  . Excision of moles    . Peg placement      With multiple changes  . Gastrostomy tube, change / reposition  12/15/2010       . Button change  12/15/2010    Procedure: BUTTON CHANGE;  Surgeon: Iva Boop, MD;  Location: [facility];  Service: Endoscopy;  Laterality: N/A;  . Peg placement  10/07/2011    Procedure:  PERCUTANEOUS ENDOSCOPIC GASTROSTOMY (PEG) REPLACEMENT;  Surgeon: Hart Carwin, MD;  Location: [facility];  Service: Endoscopy;  Laterality: N/A;  Needs 18 F 2.5 button ordered-dl  . Inguinal hernia repair Bilateral 1992  . Retinopathy of prematurity surgery  1992  . Achilles tendon lengthening  12/1998  . Soft tissue releases  wrists and fingers  12/1998  . Intrathecal baclofen pump placement  07/25/2000  . Peg placement N/A 06/05/2012    Procedure: PERCUTANEOUS ENDOSCOPIC GASTROSTOMY (PEG) REPLACEMENT;  Surgeon: Hart Carwin, MD;  Location: [facility];  Service: Endoscopy;  Laterality: N/A;  button 61fr.2.5cm  . Eye surgery Bilateral     Retinal   . Back surgery      Harrington Rods in back needs to be log rolled  . Tendon repair Bilateral 09/05/2012    Procedure: LENGTHENING OF DIGITAL FLEXOR TENDONS BILTERAL HANDS;  Surgeon: Jodi Marble, MD;  Location: [facility];  Service: Orthopedics;  Laterality: Bilateral;  . Hand surgery Bilateral Aug. 2014  . Peg placement N/A 09/13/2012    Procedure: PERCUTANEOUS ENDOSCOPIC GASTROSTOMY (PEG) REPLACEMENT;  Surgeon: Hart Carwin, MD;  Location: [facility];  Service: Endoscopy;  Laterality: N/A;   Family History  Problem Relation Age of Onset  . Adopted: Yes   History  Substance Use Topics  . Smoking status: Never Smoker   . Smokeless tobacco: Never Used  . Alcohol Use: No    Review of Systems Level V caveat applies due to patients  condition Allergies  Adhesive and Depakote  Home Medications   Current Outpatient Rx  Name  Route  Sig  Dispense  Refill  . albuterol (PROVENTIL HFA;VENTOLIN HFA) 108 (90 BASE) MCG/ACT inhaler   Inhalation   Inhale 2 puffs into the lungs every 4 (four) hours as needed for shortness of breath.         Marland Kitchen albuterol (PROVENTIL) (2.5 MG/3ML) 0.083% nebulizer solution   Nebulization   Take 2.5 mg by nebulization every 4 (four) hours as needed for wheezing or shortness of breath.          . Ascorbic  Acid (VITAMIN C) 500 MG/5ML LIQD   PEG Tube   500 mg by PEG Tube route at bedtime.          . baclofen (GABLOFEN) 40000 MCG/20ML SOLN   Intrathecal   326 mcg by Intrathecal route continuous.          . calcium carbonate, dosed in mg elemental calcium, 1250 MG/5ML   PEG Tube   1,000 mg of elemental calcium by PEG Tube route 2 (two) times daily. 4 cc via peg tube         . Diazepam (DIASTAT RE)   Rectal   Place 12.5 mg rectally as needed (grand mal seizure lasting >5 min or compromising).          . furosemide (LASIX) 20 MG tablet   PEG Tube   20 mg by PEG Tube route 4 (four) times daily as needed for fluid or edema.          . gabapentin (NEURONTIN) 250 MG/5ML solution   PEG Tube   800 mg by PEG Tube route 3 (three) times daily. 16 mL in tube         . lansoprazole (PREVACID SOLUTAB) 30 MG disintegrating tablet      Increase to 30 mg BID until PEG replacement   60 tablet   0   . levETIRAcetam (KEPPRA) 100 MG/ML solution   PEG Tube   1,750 mg by PEG Tube route 2 (two) times daily. 17.5 mls         . Multiple Vitamin (MULTIVITAMIN) LIQD   PEG Tube   10 mLs by PEG Tube route daily.         . Multiple Vitamins-Minerals (ZINC PO)   PEG Tube   5 mLs by PEG Tube route daily. 244 mg / 5 mL zinc solution         . mupirocin ointment (BACTROBAN) 2 %   Topical   Apply 1 application topically as needed (to G-T site).   22 g   1   . OnabotulinumtoxinA (BOTOX IM)   Intramuscular   Inject 1 mL into the muscle every 3 (three) months.         . OXYGEN-HELIUM IN   Inhalation   Inhale into the lungs as needed.         . polyethylene glycol (MIRALAX / GLYCOLAX) packet   PEG Tube   17 g by PEG Tube route daily as needed (Constipation).           BP 131/83  Pulse 94  Temp(Src) 99.4 F (37.4 C) (Rectal)  Resp 25  Ht 4\' 11"  (1.499 m)  Wt 120 lb (54.432 kg)  BMI 24.22 kg/m2  SpO2 93% Physical Exam  Nursing note and vitals reviewed. Constitutional:  He appears well-developed and well-nourished. No distress.  HENT:  Head: Normocephalic and atraumatic.  Mouth/Throat: No oropharyngeal exudate.  Eyes: Pupils are equal, round, and reactive to light.  Neck: Normal range of motion. Neck supple.  Cardiovascular: Normal heart sounds.  Tachycardia present.  Exam reveals no gallop and no friction rub.   No murmur heard. Pulmonary/Chest: Tachypnea noted. He has rhonchi in the right upper field, the right middle field, the right lower field, the left upper field, the left middle field and the left lower field.  Abdominal: Soft. Bowel sounds are normal. He exhibits no distension. There is no guarding.  Neurological:  The patient is contracted and not able deformity and neurological tests  Skin: Skin is warm and dry.    ED Course   Procedures (including critical care time)  Labs Reviewed  CBC WITH DIFFERENTIAL - Abnormal; Notable for the following:    WBC 14.6 (*)    Hemoglobin 18.7 (*)    HCT 53.3 (*)    Neutrophils Relative % 85 (*)    Neutro Abs 12.5 (*)    Lymphocytes Relative 7 (*)    Monocytes Absolute 1.1 (*)    All other components within normal limits  COMPREHENSIVE METABOLIC PANEL - Abnormal; Notable for the following:    Potassium 3.1 (*)    Chloride 91 (*)    Calcium 10.6 (*)    Total Protein 8.8 (*)    AST 53 (*)    ALT 101 (*)    Alkaline Phosphatase 202 (*)    Total Bilirubin 1.6 (*)    All other components within normal limits  URINALYSIS, ROUTINE W REFLEX MICROSCOPIC - Abnormal; Notable for the following:    Color, Urine AMBER (*)    APPearance CLOUDY (*)    Specific Gravity, Urine 1.036 (*)    Bilirubin Urine SMALL (*)    Ketones, ur 15 (*)    Protein, ur 100 (*)    Leukocytes, UA TRACE (*)    All other components within normal limits  URINE MICROSCOPIC-ADD ON - Abnormal; Notable for the following:    Bacteria, UA FEW (*)    Casts GRANULAR CAST (*)    All other components within normal limits  LIPASE, BLOOD   CG4 I-STAT (LACTIC ACID)   Dg Chest Portable 1 View  09/15/2012   *RADIOLOGY REPORT*  Clinical Data: Fever  PORTABLE CHEST - 1 VIEW  Comparison: 09/05/2012  Findings: The previously seen left-sided central venous line has been removed in the interval.  Postsurgical changes are again seen. The lungs are clear bilaterally.  The cardiac shadow is within normal limits.  IMPRESSION: No acute abnormality noted.   Original Report Authenticated By: Alcide Clever, M.D.   Dg Abd Portable 1v  09/15/2012   *RADIOLOGY REPORT*  Clinical Data: Vomiting.  Fever.  PORTABLE ABDOMEN - 1 VIEW  Comparison: CT, 02/08/2006  Findings: There is a normal bowel gas pattern.  No evidence of obstruction.  The soft tissues are unremarkable.  Long spine stabilization rods are noted with the bony fusion spanning the visualized spine.  This is stable.  A spine stimulator lies in the right lower quadrant with its catheter extending into the lumbar spinal canal and superiorly above the included field of view in the thoracic spinal canal.  This is also stable.  IMPRESSION: No acute findings.  No evidence of obstruction.   Original Report Authenticated  By: Amie Portland, M.D.   1. Vomiting    Patient be admitted by the internal medicine resident.  Patient, given IV Rocephin for possible urinary issue. MDM  MDM Reviewed: nursing note, vitals and previous chart Reviewed previous: labs Interpretation: labs and x-ray Consults: admitting MD      Carlyle Dolly, PA-C 09/15/12 1440

## 2012-09-15 NOTE — ED Notes (Addendum)
1910  Received report from off going RN and introduced self to the pt and the pt family.  Pt is stable at this time but will continue to monitor the pt.  2010  Five the pt 1/2 of the second cup of contrast  2034  Second half of cup given.

## 2012-09-15 NOTE — Telephone Encounter (Addendum)
[name] lvm stating that [name] had his feeding tube changed Wednesday. He was spitting up old blood, which she was told was normal. He started vomiting "dark stuff", doesn't smell like stool. He has been unable to keep his medication down for 2 days due to the vomiting. She is on her way to [facility] ED with him now. She said that she will have the ED doctor call [name].

## 2012-09-15 NOTE — ED Notes (Signed)
Parents report pt has n/v/d x 2 days ever since new feeding tube placed. Emesis coffee ground, stools yellow & watery. Also states it appears painful whenever meds or liquids given through feeding tube. States vomits after anything given through tube & therefore has not been able to keep any meds in x 2 days. Mom reports has oxygen PRN & has been requiring it due to low POX. Upon arrival on 4L [place] was 89%. Placed on 100% NRB. Pt has weak cough. Parents report noticed fever this morning 100.3 rectal.  States saw Dr. Dickie La yesterday regarding problems with feeing tube, n/v/d. Stool sample for C-diff collected & labs drawn.

## 2012-09-15 NOTE — ED Notes (Addendum)
GI MD, Dr. [name] & Dr. Clyde Lundborg at bedside.

## 2012-09-15 NOTE — ED Provider Notes (Signed)
Medical screening examination/treatment/procedure(s) were conducted as a shared visit with non-physician practitioner(s) and myself.  I personally evaluated the patient during the encounter   Darlys Gales, MD 09/15/12 2238

## 2012-09-15 NOTE — ED Notes (Signed)
Patient transported to X-ray with this nurse & Dr. [name]

## 2012-09-15 NOTE — Telephone Encounter (Signed)
Spoke with patient's caregiver. He has started vomiting during the night. Black stuff is coming around the stoma now. He is running a temp on 100.3. She is taking him to the ED at [facility]. Dr. [name] aware.

## 2012-09-15 NOTE — Telephone Encounter (Signed)
See telephone note on 09/15/12.

## 2012-09-16 LAB — CBC WITH DIFFERENTIAL/PLATELET
Basophils Absolute: 0 10*3/uL (ref 0.0–0.1)
Basophils Relative: 0 % (ref 0–1)
Eosinophils Relative: 0 % (ref 0–5)
HCT: 42.6 % (ref 39.0–52.0)
MCHC: 32.9 g/dL (ref 30.0–36.0)
Monocytes Absolute: 0.9 10*3/uL (ref 0.1–1.0)
Neutro Abs: 8.5 10*3/uL — ABNORMAL HIGH (ref 1.7–7.7)
RDW: 13.4 % (ref 11.5–15.5)

## 2012-09-16 LAB — PROTIME-INR
INR: 1.19 (ref 0.00–1.49)
Prothrombin Time: 14.8 seconds (ref 11.6–15.2)

## 2012-09-16 LAB — COMPREHENSIVE METABOLIC PANEL
ALT: 56 U/L — ABNORMAL HIGH (ref 0–53)
Alkaline Phosphatase: 138 U/L — ABNORMAL HIGH (ref 39–117)
CO2: 26 mEq/L (ref 19–32)
GFR calc Af Amer: >90 mL/min (ref >90)
Glucose, Bld: 89 mg/dL (ref 70–99)
Potassium: 3.3 mEq/L — ABNORMAL LOW (ref 3.5–5.1)
Sodium: 137 mEq/L (ref 135–145)
Total Protein: 6.5 g/dL (ref 6.0–8.3)

## 2012-09-16 LAB — APTT: aPTT: 30 seconds (ref 24–37)

## 2012-09-16 MED ORDER — CLINDAMYCIN PHOSPHATE 600 MG/50ML IV SOLN
600.0000 mg | Freq: Three times a day (TID) | INTRAVENOUS | Status: DC
  Administered 2012-09-16 – 2012-09-21 (×14): 600 mg via INTRAVENOUS
  Filled 2012-09-16 (×21): qty 50

## 2012-09-16 MED ORDER — POTASSIUM CHLORIDE 10 MEQ/100ML IV SOLN
10.0000 meq | INTRAVENOUS | Status: AC
  Administered 2012-09-16 (×3): 10 meq via INTRAVENOUS
  Filled 2012-09-16: qty 300

## 2012-09-16 MED ORDER — SODIUM CHLORIDE 0.9 % IV SOLN
2000.0000 mg | Freq: Two times a day (BID) | INTRAVENOUS | Status: DC
  Administered 2012-09-16 – 2012-09-22 (×11): 2000 mg via INTRAVENOUS
  Filled 2012-09-16 (×14): qty 20

## 2012-09-16 MED ORDER — LEVETIRACETAM 500 MG/5ML IV SOLN
250.0000 mg | Freq: Once | INTRAVENOUS | Status: AC
  Administered 2012-09-16: 250 mg via INTRAVENOUS
  Filled 2012-09-16: qty 2.5

## 2012-09-16 NOTE — Progress Notes (Signed)
Pt noted to have a 3rd seizure lasting ~30seconds or less. This nurse and mother at bedside. HR up to 120-130bpm and will return to low ST at 105bpm after episode. No change in O2 sats, 95-100% on Nonrebreather.  [phone], pt had a more jerking/twitching of eye seizure. HR noted to raise 140bpm, 30RR, O2 sats sustaining 92-100%. Provided comfort/calming to pt throughout seizure. Pt already to side and mouth sunctioned.  Discussed with seizure history with mother and mother expresses concern.  MD called, notified of seizure/focal seizure activities since arrival to floor. Discussed description of episodes, vitals, interventions, this nurse concerns, and mother's concern and how she would like for pt's primary neurologist to be contacted in am. Will continue to monitor closely.

## 2012-09-16 NOTE — Progress Notes (Signed)
Mother of pt informed this nurse that she does not want Dr. Dickie La and GI team to see pt any more. Mother expresses concern and states "do not let them step foot in here". Informed mother that this nurse will notifiy primary MD's and oncoming shift, unsure about next step for GI consult needed for pt. Mother verbalizes understanding and states "I just do not want them".  MD contacted and informed, charge nurse notified,  Will notify oncoming shift and charge nurse

## 2012-09-16 NOTE — Progress Notes (Signed)
Subjective: [name] is a 22 y.o. male w/ PMHx of Cerebral Palsy (spastic), epilepsy, and osteoporosis, presents to the ED w/ legal guardian w/ complaints of dark emesis, low grade fever, and lethargy for 2 days prior to admission. The patient has had a PEG tube since 2000 and recently had it replaced on 09/13/12 at [facility]. Since that time, it is reported that he has had vomiting w/ almost every tube feed (15-20 times in the last 2 days), resulting in a dark brown/maroon colored emesis. Patient was shown to have RLL consolidation on CT, leukocytosis on admission, low grade fever, and history of aspiration pneumonia in the past.   Patient seen at bedside this AM. Appears improved clinically. Responsive to verbal stimulation. Currently on O2 via venturi mask and NG tube in place w/ intermittent suction. No vomiting overnight. Patient did have three small seizures overnight most likely due to subtherapeutic Keppra levels 2/2 vomiting. He was given a dose of Keppra 250 mg and his daily dose was increased to 2000 mg bid.  Objective: Vital signs in last 24 hours: Filed Vitals:   09/16/12 0800 09/16/12 0900 09/16/12 1000 09/16/12 1100  BP: 99/52 116/74    Pulse: 83 107 95 109  Temp: 98.8 F (37.1 C)     TempSrc: Axillary     Resp: 19 21 19 23   Height:      Weight:      SpO2: 98% 100% 97% 99%   Weight change:   Intake/Output Summary (Last 24 hours) at 09/16/12 1129 Last data filed at 09/16/12 1100  Gross per 24 hour  Intake   2570 ml  Output    450 ml  Net   2120 ml   Physical Exam: General: More alert, in no apparent distress. Patient has Cerebral Palsy, is nonverbal and immobile. HEENT: Oropharynx clear and non-erythematous  Lungs: Clear to ascultation bilaterally, normal work of respiration, no wheezes, rales, ronchi Heart: Regular rate and rhythm, no murmurs, gallops, or rubs Abdomen: Soft, non-tender, non-distended, normal bowel sounds. PEG in place. Site looks clean w/out  leak. Extremities: No cyanosis, clubbing, or edema. Patient w/ structural abnormalities involving the extremities. Both LE w/ contractures, UE's currently bandaged 2/2 recent tendon release surgery. Neurologic: Patient w/ cerebral palsy. Currently somnolent, but responsive to verbal stimuli. Unable to properly assess neurological function.  Lab Results: Basic Metabolic Panel:  Recent Labs Lab 09/15/12 0939 09/16/12 0142  NA 141 137  K 3.1* 3.3*  CL 91* 98  CO2 32 26  GLUCOSE 86 89  BUN 18 16  CREATININE 0.64 0.59  CALCIUM 10.6* 8.8   Liver Function Tests:  Recent Labs Lab 09/15/12 0939 09/16/12 0142  AST 53* 29  ALT 101* 56*  ALKPHOS 202* 138*  BILITOT 1.6* 1.3*  PROT 8.8* 6.5  ALBUMIN 4.8 3.3*    Recent Labs Lab 09/15/12 0939  LIPASE 31   No results found for this basename: AMMONIA,  in the last 168 hours CBC:  Recent Labs Lab 09/15/12 0939 09/16/12 0142  WBC 14.6* 10.4  NEUTROABS 12.5* 8.5*  HGB 18.7* 14.0  HCT 53.3* 42.6  MCV 96.7 99.8  PLT 181 135*   Coagulation:  Recent Labs Lab 09/16/12 0142  LABPROT 14.8  INR 1.19   Urinalysis:  Recent Labs Lab 09/15/12 1300  COLORURINE AMBER*  LABSPEC 1.036*  PHURINE 5.5  GLUCOSEU NEGATIVE  HGBUR NEGATIVE  BILIRUBINUR SMALL*  KETONESUR 15*  PROTEINUR 100*  UROBILINOGEN 1.0  NITRITE NEGATIVE  LEUKOCYTESUR TRACE*    Micro Results: Recent Results (from the past 240 hour(s))  CLOSTRIDIUM DIFFICILE BY PCR     Status: None   Collection Time    09/14/12 10:45 AM      Result Value Range Status   C difficile by pcr Not Detected  Not Detected Final   Comment:       This assay detects the presence of Clostridium difficile DNA coding     for toxin B (tcdB) by real-time polymerase chain reaction (PCR)     amplification.     This test was developed and its performance characteristics have been     determined by [employer]. Performance characteristics refer     to the analytical  performance of the test. This test has not been     cleared or approved by the [place] Food and Drug Administration. The FDA     has determined that such clearance or approval is not necessary. This     laboratory is certified under the Clinical Laboratory Improvement     Amendments of 1988 as qualified to perform high complexity clinical     laboratory testing.  MRSA PCR SCREENING     Status: None   Collection Time    09/16/12  2:44 AM      Result Value Range Status   MRSA by PCR NEGATIVE  NEGATIVE Final   Comment:            The GeneXpert MRSA Assay (FDA     approved for NASAL specimens     only), is one component of a     comprehensive MRSA colonization     surveillance program. It is not     intended to diagnose MRSA     infection nor to guide or     monitor treatment for     MRSA infections.   Studies/Results: Dg Abd 1 View  09/15/2012   *RADIOLOGY REPORT*  Clinical Data: 22 year old male with cerebral palsy.  Recent G tube exchange, now with emesis.  Query subsequent duodenal obstruction. Post contrast injection through the G tube can be requested  ABDOMEN - 1 VIEW  Comparison: 09/15/2012 at 1045 hours. CT abdomen and pelvis 18[address].  Findings: Water soluble Omnipaque contrast was injected via the patient's existing G tube prior to this image.  Supine AP view at 1723 hours.  Moderate gaseous distention of the stomach as seen earlier, and also similar to that 2008 comparison.  The internal G tube balloon projects over the lower gastric body and is not appear in proximity of the antrum.  There is contrast in the gastric fundus, at the antrum, and also outlining some small bowel in the left upper quadrant at the level of the ligament of Treitz.  No contrast extravasation or abnormal collection identified.  Otherwise stable bowel gas pattern from earlier.  No dilated loops identified.  There is some retained stool in the colon.  Chronic osseous changes of cerebral palsy with superimposed bilateral  posterior spinal fusion rods and right lower quadrant spinal/bone stimulator device.  IMPRESSION: 1.  G-tube placement appears adequate and similar to that in 2008. Gaseous distention of the stomach, but some of the injected water soluble contrast has reached the ligament of Treitz without evidence of obstruction. 2.  Otherwise stable KUB appearance of the abdomen since 1048 hours today.   Original Report Authenticated By: Erskine Speed, M.D.   Ct Abdomen Pelvis W Contrast  09/15/2012   *RADIOLOGY REPORT*  Clinical Data: Assess  CT ABDOMEN AND PELVIS WITH CONTRAST  Technique:  Multidetector CT imaging of the abdomen and pelvis was performed following the standard protocol during bolus administration of intravenous contrast. Oral contrast was administered.  Contrast: 80mL OMNIPAQUE IOHEXOL 300 MG/ML  SOLN  Comparison: February 08, 2006  Findings: There is artifact from extensive for spinal rod fixation.  There is consolidation throughout much of the right lower lobe. The visualized left base is clear.  Nasogastric tube tip is in the stomach.  Gastrostomy catheter also is positioned in the stomach with balloon distended along the anterior gastric wall.  No focal liver lesions are appreciated.  There is no biliary duct dilatation.  Spleen, pancreas, and adrenals appear within normal limits.  There is a 4 mm calculus in the lower pole of the right kidney which is nonobstructing.  Kidneys bilaterally otherwise appear within normal limits.  In the pelvis, there is rather marked thickening of the wall of the rectum.  There is also thickening of the wall of the mid to distal sigmoid colon.  There is no surrounding mesenteric inflammation. There is no pelvic mass or fluid collection. Appendix appears normal.  There is a stimulator overlying the right upper pelvis.  There is no bowel obstruction.  There is no apparent free air or portal venous air.  Contrast is seen throughout the colon.  There is no ascites, adenopathy, or  abscess apparent in the abdomen or pelvis.  There is marked scoliosis.  No blastic or lytic bone lesions are identified.  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 The acetabular shallow bilaterally.  IMPRESSION: There is rather marked thickening of the wall of the rectum and distal descending colon consistent with colitis and proctitis. There is no abscess in these areas.  There is consolidation throughout much of the right lower lobe.  Gastrostomy catheter is positioned within the stomach.  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  There is marked scoliosis with extensive right fixation.  Note that the rod fixation is causing considerable artifact making this study less than optimal.  There is a nonobstructing 4 mm calculus in the lower pole of the right kidney.  There is no hydronephrosis on either side.  No bowel obstruction.  Appendix appears normal.   Original Report Authenticated By: Bretta Bang, M.D.   Dg Chest Portable 1 View  09/15/2012   *RADIOLOGY REPORT*  Clinical Data: Fever  PORTABLE CHEST - 1 VIEW  Comparison: 09/05/2012  Findings: The previously seen left-sided central venous line has been removed in the interval.  Postsurgical changes are again seen. The lungs are clear bilaterally.  The cardiac shadow is within normal limits.  IMPRESSION: No acute abnormality noted.   Original Report Authenticated By: Alcide Clever, M.D.   Dg Abd Portable 1v  09/15/2012   *RADIOLOGY REPORT*  Clinical Data: Vomiting.  Fever.  PORTABLE ABDOMEN - 1 VIEW  Comparison: CT, 02/08/2006  Findings: There is a normal bowel gas pattern.  No evidence of obstruction.  The soft tissues are unremarkable.  Long spine stabilization rods are noted with the bony fusion spanning the visualized spine.  This is stable.  A spine stimulator lies in the right lower quadrant with its catheter extending into the lumbar spinal canal and superiorly above the  included field of view in the thoracic spinal canal.  This is also stable.  IMPRESSION: No acute findings.  No evidence of obstruction.   Original  Report Authenticated By: Amie Portland, M.D.   Medications: I have reviewed the patient's current medications. Scheduled Meds: . calcium carbonate (dosed in mg elemental calcium)  1,000 mg of elemental calcium Oral BID WC  . gabapentin  800 mg Per Tube TID  . levETIRAcetam  2,000 mg Intravenous Q12H  . multivitamin  10 mL Oral Daily  . pantoprazole (PROTONIX) IV  40 mg Intravenous Q12H  . piperacillin-tazobactam (ZOSYN)  IV  3.375 g Intravenous Q8H  . sodium chloride  3 mL Intravenous Q12H  . vitamin C  500 mg Oral QHS   Continuous Infusions: . sodium chloride 100 mL/hr at 09/16/12 0857   PRN Meds:.sodium chloride, albuterol, sodium chloride  Assessment/Plan: 22 y.o. male w/ PMHx of Cerebral Palsy (spastic), epilepsy, and osteoporosis, presents to the ED w/ legal guardian w/ complaints of dark emesis, low grade fever, and lethargy for the past 2 days.   #Dark brown emesis w/ elevated LFT's- Patient w/ recent vomiting for the past 2 days, s/p PEG replacement on 09/13/12. Emesis appears dark brown and slightly maroon and occurs after every tube feed. Most likely 2/2 gastric mucosal irritation d/t new PEG placement. CT abdomen shows no signs of cholecystitis or gastric inflammation. PEG shown to be in proper position. -We will continue w/ NG tube in place and keep NPO for now. We will consider restarting clear fluid tube feeds in AM if clinically improved.  -GI was consulted. Recommend supportive care at this time. If vomiting continues w/ coffee ground appearance, EGD will be performed. -LFTs in the AM show significant improvement. AST 29, ALT 56, total protein 6.5, and total bili 1.3. -Hepatitis panel ordered, results pending. Very unlikely that patient has hepatitis as he is very low risk given his condition. -Continue Protonix 40 mg IV bid  -Given  Zofran 4 mg prn  -Continue NS @ 75 ml/hr   #Possible aspiration pneumonia- Patient has a known history of aspiration pneumonia in the past. His guardian records his SpO2 at home and reports a recent decrease in O2 sat in the last day. In ED, SpO2 was 88%, placed on 4L O2 via [place], now satting in the mid 90's. CXR clear, showing no acute cardiopulmonary disease, however, the patient has leukocytosis, low grade fever, and recent frequent vomiting. CT showed RLL infiltrate, concerning for aspiration. -Changed Zosyn to Clindamycin 600 mg IV q8h. -Continue NPO for now to prevent more vomiting/aspiration until tomorrow AM if patient is clinically improved.   #Epilepsy- Patient has a known history of seizure disorder. The guardian claims that his seizures are well controlled on Keppra 1,750 mg by PEG Tube bid. He only has about 4 seizures per year. He did have a reported seizure today, resulting in UE and head convulsions, most likely d/t his recent vomiting after every tube feed, including medication administration. The patient is not known to be post-ictal after his seizures and this is most likely not responsible for his recent fatigue.  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 He has contractures in all extremities and does respond visually to pain. 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 He is usually awake and alert, but he presents to the ED much more lethargic than his baseline. He receives Botox injections infrequently for spasticity, baclofen intrathecally, and neurontin.  -Stable at this time.  Dispo: Disposition is deferred at this  time, awaiting improvement of current medical problems.  Anticipated discharge in approximately 2-3 day(s).   The patient does have a current PCP Mia Creek, MD) and does need an [facility] hospital follow-up  appointment after discharge.  The patient does not have transportation limitations that hinder transportation to clinic appointments.  .Services Needed at time of discharge: Y = Yes, Blank = No PT:   OT:   RN:   Equipment:   Other:     LOS: 1 day   Lars Masson, MD 09/16/2012, 11:29 AM Pager: [phone]

## 2012-09-16 NOTE — Progress Notes (Signed)
Barimax air mattress applied by [employer] and Nursing staff. Father okay with mattress.

## 2012-09-16 NOTE — H&P (Signed)
IM ATTENDING H&P  Date: 09/16/2012  Patient name: Jeffery [name]  Medical record number: [id number]  Date of birth: 1990/08/01   This patient has been seen and the plan of care was discussed with the house staff. Please see their note for complete details. I concur with their findings with the following additions/corrections:  [name] is a 22yo man with h/o CP, epilepsy who presented with emesis dark in color, low grade fever and lethargy for 2 days.  The report is that these symptoms started in relation to changing out of his PEG tube which was replaced on 8/13.  Since that time he has not been able to tolerate much through the PEG tube and had consistent vomiting.  In the ED, he had a decreased in his O2 sats to 86-88%.  There is also a report of loose stools.  Since being admitted, the PEG tube was evaluated and moved to a better position, it is still not in use.  The patient has also had breakthrough seizures, likely due to illness and poor GI absorption of his keppra due to vomiting.  A further issue is likely aspiration pneumonia as seen on CT scan of the abdomen.   Current acute issues - -  1. Vomiting/emesis, misplaced PEG tube: the PEG has been moved, currently not in use.  NG tube in place to suction.  No further vomiting since last night.  Would keep NGT in place for today.  GI consulted, will ask their advice re: trying to use to the PEG tube.  CT scan abdomen revealed proctitis, but no other active issue.   2. Breakthrough Seizures: Keppra switched to IV, seizure protocol.   3. Aspiration pneumonia: BC pending.  Can likely narrow coverage to Clindamycin or Amox/Clav, have discussed with resident team.   4. Abnormal LFTs: Trending down, continue to monitor, hepatitis panel drawn  5. Low K: replace  Other issues per resident note.   Inez Catalina, MD 09/16/2012, 8:15 AM

## 2012-09-16 NOTE — Progress Notes (Signed)
Spoke to [employer] in Forensic psychologist regarding family stating that air mattress is not inflating properly. Sherilyn Cooter states that [employer] will arrive at facility shortly.

## 2012-09-16 NOTE — Progress Notes (Signed)
Engineer, water applied by [employer], [employer].

## 2012-09-16 NOTE — Progress Notes (Signed)
Called at 4:45am by nurse stating patient had 2 seizures (1 at 3:50am, 1 at 4:40am) lasting <1 minute each, no VS abnormalities other than transient tachycardia. His home 1750mg  keppra had been given at 12:45am. Curbsided neuro who recommended giving an extra dose of 250mg  keppra now, and then increasing his daily dose to 2000mg  BID starting 12 hours from now (5pm).  Vivi Barrack, MD [phone]

## 2012-09-16 NOTE — Progress Notes (Signed)
Subjective: No acute events.    Objective: Vital signs in last 24 hours: Temp:  [98 F (36.7 C)-100.2 F (37.9 C)] 98.7 F (37.1 C) (08/16 0420) Pulse Rate:  [82-137] 84 (08/16 0700) Resp:  [11-37] 18 (08/16 0700) BP: (94-137)/(53-96) 94/53 mmHg (08/16 0700) SpO2:  [89 %-100 %] 99 % (08/16 0700) FiO2 (%):  [100 %] 100 % (08/16 0420) Weight:  [103 lb 9.9 oz (47 kg)-120 lb (54.432 kg)] 103 lb 9.9 oz (47 kg) (08/15 2231) Last BM Date: 09/15/12  Intake/Output from previous day: 08/15 0701 - 08/16 0700 In: 2220 [I.V.:1725; NG/GT:60; IV Piggyback:435] Out: 450 [Emesis/NG output:350] Intake/Output this shift:    General appearance: uncommunicative secondary to CP GI: soft, non-tender; bowel sounds normal; no masses,  no organomegaly  Lab Results:  Recent Labs  09/14/12 1044 09/15/12 0939 09/16/12 0142  WBC 9.6 14.6* 10.4  HGB 17.6* 18.7* 14.0  HCT 51.6 53.3* 42.6  PLT 173.0 181 135*   BMET  Recent Labs  09/15/12 0939 09/16/12 0142  NA 141 137  K 3.1* 3.3*  CL 91* 98  CO2 32 26  GLUCOSE 86 89  BUN 18 16  CREATININE 0.64 0.59  CALCIUM 10.6* 8.8   LFT  Recent Labs  09/16/12 0142  PROT 6.5  ALBUMIN 3.3*  AST 29  ALT 56*  ALKPHOS 138*  BILITOT 1.3*   PT/INR  Recent Labs  09/16/12 0142  LABPROT 14.8  INR 1.19   Hepatitis Panel No results found for this basename: HEPBSAG, HCVAB, HEPAIGM, HEPBIGM,  in the last 72 hours C-Diff No results found for this basename: CDIFFTOX,  in the last 72 hours Fecal Lactopherrin No results found for this basename: FECLLACTOFRN,  in the last 72 hours  Studies/Results: Dg Abd 1 View  09/15/2012   *RADIOLOGY REPORT*  Clinical Data: 22 year old male with cerebral palsy.  Recent G tube exchange, now with emesis.  Query subsequent duodenal obstruction. Post contrast injection through the G tube can be requested  ABDOMEN - 1 VIEW  Comparison: 09/15/2012 at 1045 hours. CT abdomen and pelvis [address].  Findings: Water soluble  Omnipaque contrast was injected via the patient's existing G tube prior to this image.  Supine AP view at 1723 hours.  Moderate gaseous distention of the stomach as seen earlier, and also similar to that 2008 comparison.  The internal G tube balloon projects over the lower gastric body and is not appear in proximity of the antrum.  There is contrast in the gastric fundus, at the antrum, and also outlining some small bowel in the left upper quadrant at the level of the ligament of Treitz.  No contrast extravasation or abnormal collection identified.  Otherwise stable bowel gas pattern from earlier.  No dilated loops identified.  There is some retained stool in the colon.  Chronic osseous changes of cerebral palsy with superimposed bilateral posterior spinal fusion rods and right lower quadrant spinal/bone stimulator device.  IMPRESSION: 1.  G-tube placement appears adequate and similar to that in 2008. Gaseous distention of the stomach, but some of the injected water soluble contrast has reached the ligament of Treitz without evidence of obstruction. 2.  Otherwise stable KUB appearance of the abdomen since 1048 hours today.   Original Report Authenticated By: Erskine Speed, M.D.   Ct Abdomen Pelvis W Contrast  09/15/2012   *RADIOLOGY REPORT*  Clinical Data: Assess  CT ABDOMEN AND PELVIS WITH CONTRAST  Technique:  Multidetector CT imaging of the abdomen and pelvis was performed  following the standard protocol during bolus administration of intravenous contrast. Oral contrast was administered.  Contrast: 80mL OMNIPAQUE IOHEXOL 300 MG/ML  SOLN  Comparison: February 08, 2006  Findings: There is artifact from extensive for spinal rod fixation.  There is consolidation throughout much of the right lower lobe. The visualized left base is clear.  Nasogastric tube tip is in the stomach.  Gastrostomy catheter also is positioned in the stomach with balloon distended along the anterior gastric wall.  No focal liver lesions are  appreciated.  There is no biliary duct dilatation.  Spleen, pancreas, and adrenals appear within normal limits.  There is a 4 mm calculus in the lower pole of the right kidney which is nonobstructing.  Kidneys bilaterally otherwise appear within normal limits.  In the pelvis, there is rather marked thickening of the wall of the rectum.  There is also thickening of the wall of the mid to distal sigmoid colon.  There is no surrounding mesenteric inflammation. There is no pelvic mass or fluid collection. Appendix appears normal.  There is a stimulator overlying the right upper pelvis.  There is no bowel obstruction.  There is no apparent free air or portal venous air.  Contrast is seen throughout the colon.  There is no ascites, adenopathy, or abscess apparent in the abdomen or pelvis.  There is marked scoliosis.  No blastic or lytic bone lesions are identified.  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 The acetabular shallow bilaterally.  IMPRESSION: There is rather marked thickening of the wall of the rectum and distal descending colon consistent with colitis and proctitis. There is no abscess in these areas.  There is consolidation throughout much of the right lower lobe.  Gastrostomy catheter is positioned within the stomach.  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  There is marked scoliosis with extensive right fixation.  Note that the rod fixation is causing considerable artifact making this study less than optimal.  There is a nonobstructing 4 mm calculus in the lower pole of the right kidney.  There is no hydronephrosis on either side.  No bowel obstruction.  Appendix appears normal.   Original Report Authenticated By: Bretta Bang, M.D.   Dg Chest Portable 1 View  09/15/2012   *RADIOLOGY REPORT*  Clinical Data: Fever  PORTABLE CHEST - 1 VIEW  Comparison: 09/05/2012  Findings: The previously seen left-sided central venous line has  been removed in the interval.  Postsurgical changes are again seen. The lungs are clear bilaterally.  The cardiac shadow is within normal limits.  IMPRESSION: No acute abnormality noted.   Original Report Authenticated By: Alcide Clever, M.D.   Dg Abd Portable 1v  09/15/2012   *RADIOLOGY REPORT*  Clinical Data: Vomiting.  Fever.  PORTABLE ABDOMEN - 1 VIEW  Comparison: CT, 02/08/2006  Findings: There is a normal bowel gas pattern.  No evidence of obstruction.  The soft tissues are unremarkable.  Long spine stabilization rods are noted with the bony fusion spanning the visualized spine.  This is stable.  A spine stimulator lies in the right lower quadrant with its catheter extending into the lumbar spinal canal and superiorly above the included field of view in the thoracic spinal canal.  This is also stable.  IMPRESSION: No acute findings.  No evidence of obstruction.   Original Report Authenticated By: Amie Portland, M.D.    Medications:  Scheduled: . calcium carbonate (dosed in mg elemental calcium)  1,000 mg  of elemental calcium Oral BID WC  . gabapentin  800 mg Per Tube TID  . levETIRAcetam  2,000 mg Intravenous Q12H  . multivitamin  10 mL Oral Daily  . pantoprazole (PROTONIX) IV  40 mg Intravenous Q12H  . piperacillin-tazobactam (ZOSYN)  IV  3.375 g Intravenous Q8H  . sodium chloride  3 mL Intravenous Q12H  . vitamin C  500 mg Oral QHS   Continuous: . sodium chloride 75 mL/hr at 09/16/12 0055    Assessment/Plan: 1) Proctitis. 2) Dehydration.   The CT scan reports that the PEG tube is in proper position, but there is thickening in the rectum suggesting a proctitis.  There is no report that he has had any issues with hematochezia or diarrhea.  I think this issue can be followed up as an outpatient unless he is symptomatic in the hospital.  No reports of any vomiting and his HGB has dropped down to 14 g/dL.  I suspect that this was as a result of hydration.  Plan: 1) Continue with  supportive care. 2) If vomiting continues with coffee ground emesis, an EGD will be performed. 3) Follow up on the proctitis issue as an outpatient.   LOS: 1 day   Chevis Weisensel D 09/16/2012, 9:01 AM

## 2012-09-16 NOTE — Progress Notes (Addendum)
Spoke to IR regarding scheduling of PICC placement with IR, according to current order. IR representative states that IV team was to place PICC and that IR became involved yesterday due to scheduling conflict with PICC Team. Notified Dr. Verdie Mosher and Dr. Yetta Barre - no new orders at this time. Informed Dr. Yetta Barre that mother [facility]) did not want patient to have any more needle sticks for lab draws.  Await order clarification.

## 2012-09-16 NOTE — Progress Notes (Signed)
MD phone this nurse. Updated on course of care for pt after discussion with Neuro and new orders for Keppra to be increase and extra dose given now. Contact pharmacy to notify of "now" dose.  Will update mother and continue to monitor.

## 2012-09-17 LAB — COMPREHENSIVE METABOLIC PANEL
ALT: 38 U/L (ref 0–53)
Albumin: 3.1 g/dL — ABNORMAL LOW (ref 3.5–5.2)
Alkaline Phosphatase: 122 U/L — ABNORMAL HIGH (ref 39–117)
BUN: 7 mg/dL (ref 6–23)
Chloride: 98 mEq/L (ref 96–112)
GFR calc Af Amer: >90 mL/min (ref >90)
Glucose, Bld: 98 mg/dL (ref 70–99)
Potassium: 3.2 mEq/L — ABNORMAL LOW (ref 3.5–5.1)
Sodium: 137 mEq/L (ref 135–145)
Total Bilirubin: 1.5 mg/dL — ABNORMAL HIGH (ref 0.3–1.2)

## 2012-09-17 LAB — MAGNESIUM: Magnesium: 1.8 mg/dL (ref 1.5–2.5)

## 2012-09-17 MED ORDER — POTASSIUM CHLORIDE 10 MEQ/100ML IV SOLN
10.0000 meq | INTRAVENOUS | Status: AC
  Administered 2012-09-17 (×5): 10 meq via INTRAVENOUS
  Filled 2012-09-17: qty 500

## 2012-09-17 NOTE — Progress Notes (Signed)
8/17-MOTHER STATES THAT DR. Dickie La OR ANY OF [facility] SHOULD NOT SEE PATIENT. ENTIRE TREATMENT TEAM SHOULD BE AWARE OF THIS. ATTENDING/IMTS TEAM AWARE.

## 2012-09-17 NOTE — Progress Notes (Signed)
  Date: 09/17/2012  Patient name: Jeffery [name]  Medical record number: [id number]  Date of birth: 1990/08/31   This patient has been seen and the plan of care was discussed with the house staff. Please see their note for complete details. I concur with their findings with the following additions/corrections:  Can consider clamping NGT today given resolution of vomiting, or could post pone until tomorrow.  Jeffery [name] is doing much better today.  No further seizures.  Will consult [facility] in the AM.  Was unclear to the team that Dr. Elnoria Howard was affiliated with [facility].  Per family request, would like another opinion about PEG tube and this will not be used currently.   Inez Catalina, MD 09/17/2012, 12:44 PM

## 2012-09-17 NOTE — Progress Notes (Signed)
Subjective:  - Per care giver, patient's condition improved significantly. No cough, vomiting and fever.  - still has mild diarrhea, C Diff pcr negative on 8/14. - No new episodes of seizure.  - K=3.2-->will give 10 mEq X 5 by IV - Mg normal - The CT scan reports that the PEG tube is in proper position. - Hgb:   Recent Labs Lab 09/14/12 1044 09/15/12 0939 09/16/12 0142  HGB 17.6* 18.7* 14.0    Objective: Vital signs in last 24 hours: Filed Vitals:   09/17/12 0600 09/17/12 0700 09/17/12 0800 09/17/12 0900  BP: 113/64  104/32   Pulse: 97 107 101 103  Temp:   98.6 F (37 C)   TempSrc:   Oral   Resp: 22 20 27 24   Height:      Weight:      SpO2: 96% 98% 92% 95%   Weight change:   Intake/Output Summary (Last 24 hours) at 09/17/12 1140 Last data filed at 09/17/12 0900  Gross per 24 hour  Intake   1500 ml  Output      0 ml  Net   1500 ml   Physical Exam: General: More alert, in no apparent distress. Patient has Cerebral Palsy, is nonverbal and immobile. HEENT: Oropharynx clear and non-erythematous  Lungs: Clear to ascultation bilaterally, normal work of respiration, no wheezes, rales, ronchi Heart: Regular rate and rhythm, no murmurs, gallops, or rubs Abdomen: Soft, non-tender, non-distended, normal bowel sounds. PEG in place. Site looks clean w/out leak. Extremities: No cyanosis, clubbing, or edema. Patient w/ structural abnormalities involving the extremities. Both LE w/ contractures, UE's currently bandaged 2/2 recent tendon release surgery. Neurologic: Patient w/ cerebral palsy. Rsponsive to verbal stimuli. Unable to properly assess neurological function.  Lab Results: Basic Metabolic Panel:  Recent Labs Lab 09/16/12 0142 09/17/12 0708  NA 137 137  K 3.3* 3.2*  CL 98 98  CO2 26 27  GLUCOSE 89 98  BUN 16 7  CREATININE 0.59 0.47*  CALCIUM 8.8 9.0  MG  --  1.8   Liver Function Tests:  Recent Labs Lab 09/16/12 0142 09/17/12 0708  AST 29 22  ALT  56* 38  ALKPHOS 138* 122*  BILITOT 1.3* 1.5*  PROT 6.5 6.1  ALBUMIN 3.3* 3.1*    Recent Labs Lab 09/15/12 0939  LIPASE 31   No results found for this basename: AMMONIA,  in the last 168 hours CBC:  Recent Labs Lab 09/15/12 0939 09/16/12 0142  WBC 14.6* 10.4  NEUTROABS 12.5* 8.5*  HGB 18.7* 14.0  HCT 53.3* 42.6  MCV 96.7 99.8  PLT 181 135*   Coagulation:  Recent Labs Lab 09/16/12 0142  LABPROT 14.8  INR 1.19   Urinalysis:  Recent Labs Lab 09/15/12 1300  COLORURINE AMBER*  LABSPEC 1.036*  PHURINE 5.5  GLUCOSEU NEGATIVE  HGBUR NEGATIVE  BILIRUBINUR SMALL*  KETONESUR 15*  PROTEINUR 100*  UROBILINOGEN 1.0  NITRITE NEGATIVE  LEUKOCYTESUR TRACE*    Micro Results: Recent Results (from the past 240 hour(s))  CLOSTRIDIUM DIFFICILE BY PCR     Status: None   Collection Time    09/14/12 10:45 AM      Result Value Range Status   C difficile by pcr Not Detected  Not Detected Final   Comment:       This assay detects the presence of Clostridium difficile DNA coding     for toxin B (tcdB) by real-time polymerase chain reaction (PCR)     amplification.  This test was developed and its performance characteristics have been     determined by [employer]. Performance characteristics refer     to the analytical performance of the test. This test has not been     cleared or approved by the [place] Food and Drug Administration. The FDA     has determined that such clearance or approval is not necessary. This     laboratory is certified under the Clinical Laboratory Improvement     Amendments of 1988 as qualified to perform high complexity clinical     laboratory testing.  MRSA PCR SCREENING     Status: None   Collection Time    09/16/12  2:44 AM      Result Value Range Status   MRSA by PCR NEGATIVE  NEGATIVE Final   Comment:            The GeneXpert MRSA Assay (FDA     approved for NASAL specimens     only), is one component of a     comprehensive MRSA  colonization     surveillance program. It is not     intended to diagnose MRSA     infection nor to guide or     monitor treatment for     MRSA infections.   Studies/Results: Dg Abd 1 View  09/15/2012   *RADIOLOGY REPORT*  Clinical Data: 22 year old male with cerebral palsy.  Recent G tube exchange, now with emesis.  Query subsequent duodenal obstruction. Post contrast injection through the G tube can be requested  ABDOMEN - 1 VIEW  Comparison: 09/15/2012 at 1045 hours. CT abdomen and pelvis [address].  Findings: Water soluble Omnipaque contrast was injected via the patient's existing G tube prior to this image.  Supine AP view at 1723 hours.  Moderate gaseous distention of the stomach as seen earlier, and also similar to that 2008 comparison.  The internal G tube balloon projects over the lower gastric body and is not appear in proximity of the antrum.  There is contrast in the gastric fundus, at the antrum, and also outlining some small bowel in the left upper quadrant at the level of the ligament of Treitz.  No contrast extravasation or abnormal collection identified.  Otherwise stable bowel gas pattern from earlier.  No dilated loops identified.  There is some retained stool in the colon.  Chronic osseous changes of cerebral palsy with superimposed bilateral posterior spinal fusion rods and right lower quadrant spinal/bone stimulator device.  IMPRESSION: 1.  G-tube placement appears adequate and similar to that in 2008. Gaseous distention of the stomach, but some of the injected water soluble contrast has reached the ligament of Treitz without evidence of obstruction. 2.  Otherwise stable KUB appearance of the abdomen since 1048 hours today.   Original Report Authenticated By: Erskine Speed, M.D.   Ct Abdomen Pelvis W Contrast  09/15/2012   *RADIOLOGY REPORT*  Clinical Data: Assess  CT ABDOMEN AND PELVIS WITH CONTRAST  Technique:  Multidetector CT imaging of the abdomen and pelvis was performed following  the standard protocol during bolus administration of intravenous contrast. Oral contrast was administered.  Contrast: 80mL OMNIPAQUE IOHEXOL 300 MG/ML  SOLN  Comparison: February 08, 2006  Findings: There is artifact from extensive for spinal rod fixation.  There is consolidation throughout much of the right lower lobe. The visualized left base is clear.  Nasogastric tube tip is in the stomach.  Gastrostomy catheter also is positioned in the stomach with  balloon distended along the anterior gastric wall.  No focal liver lesions are appreciated.  There is no biliary duct dilatation.  Spleen, pancreas, and adrenals appear within normal limits.  There is a 4 mm calculus in the lower pole of the right kidney which is nonobstructing.  Kidneys bilaterally otherwise appear within normal limits.  In the pelvis, there is rather marked thickening of the wall of the rectum.  There is also thickening of the wall of the mid to distal sigmoid colon.  There is no surrounding mesenteric inflammation. There is no pelvic mass or fluid collection. Appendix appears normal.  There is a stimulator overlying the right upper pelvis.  There is no bowel obstruction.  There is no apparent free air or portal venous air.  Contrast is seen throughout the colon.  There is no ascites, adenopathy, or abscess apparent in the abdomen or pelvis.  There is marked scoliosis.  No blastic or lytic bone lesions are identified.  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 The acetabular shallow bilaterally.  IMPRESSION: There is rather marked thickening of the wall of the rectum and distal descending colon consistent with colitis and proctitis. There is no abscess in these areas.  There is consolidation throughout much of the right lower lobe.  Gastrostomy catheter is positioned within the stomach.  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  There is marked scoliosis with extensive right  fixation.  Note that the rod fixation is causing considerable artifact making this study less than optimal.  There is a nonobstructing 4 mm calculus in the lower pole of the right kidney.  There is no hydronephrosis on either side.  No bowel obstruction.  Appendix appears normal.   Original Report Authenticated By: Bretta Bang, M.D.   Medications: I have reviewed the patient's current medications. Scheduled Meds: . calcium carbonate (dosed in mg elemental calcium)  1,000 mg of elemental calcium Oral BID WC  . clindamycin (CLEOCIN) IV  600 mg Intravenous Q8H  . gabapentin  800 mg Per Tube TID  . levETIRAcetam  2,000 mg Intravenous Q12H  . multivitamin  10 mL Oral Daily  . pantoprazole (PROTONIX) IV  40 mg Intravenous Q12H  . potassium chloride  10 mEq Intravenous Q1 Hr x 5  . sodium chloride  3 mL Intravenous Q12H  . vitamin C  500 mg Oral QHS   Continuous Infusions: . sodium chloride 100 mL/hr at 09/16/12 0857   PRN Meds:.sodium chloride, albuterol, sodium chloride  Assessment/Plan: 22 y.o. male w/ PMHx of Cerebral Palsy (spastic), epilepsy, and osteoporosis, presents to the ED w/ legal guardian w/ complaints of dark emesis, low grade fever, and lethargy for the past 2 days.   #Dark brown emesis: improving: Patient w/ recent vomiting for the past 2 days , s/p PEG replacement on 09/13/12. Emesis appears dark brown and slightly maroon and occurs after every tube feed. Most likely 2/2 gastric mucosal irritation d/t new PEG placement. CT abdomen shows no signs of cholecystitis or gastric inflammation, but there is thickening in the rectum suggesting a proctitis. GI was consulted. Per Dr. Elnoria Howard, proctitis issue can be followed up as an outpatient. AST 29-->22, ALT 56-->38 andtotal bili 1.3-->1.5. Hepatitis panel negative for HBsAg and HCV ab and pending the final results.  - Appreciate GI's consult in managing our pt. - NPO - IVF: NS 100 cc/h - Continue Protonix 40 mg IV bid  - Given Zofran  4 mg prn   #  Possible aspiration pneumonia- Patient has a known history of aspiration pneumonia in the past. His guardian records his SpO2 at home and reports a recent decrease in O2 sat in the last day. In ED, SpO2 was 88%, placed on 4L O2 via [place], now satting in the mid 90's. CXR clear, showing no acute cardiopulmonary disease, however, the patient has leukocytosis, low grade fever, and recent frequent vomiting. CT showed RLL infiltrate, concerning for aspiration. -Changed Zosyn to Clindamycin 600 mg IV q8h. Will continue  #Epilepsy- Patient has a known history of seizure disorder. The guardian claims that his seizures are well controlled on Keppra 1,750 mg by PEG Tube bid. He only has about 4 seizures per year. He did have a reported seizure toay, resulting in UE and head convulsions, most likely d/t his recent vomiting after every tube feed, including medication administration. The patient is not known to be post-ictal after his seizures and this is most likely not responsible for his recent fatigue.  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 He has contractures in all extremities and does respond visually to pain. 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 He is usually awake and alert, but he presents to the ED much more lethargic than his baseline. He receives Botox injections infrequently for spasticity, baclofen intrathecally, and neurontin.  -Stable at this time.  #: Hypokalemia: K=3.2. Mg normal. Will replete 10 mEq KCl x 5.   Dispo: Disposition is deferred at this time, awaiting improvement of current medical problems.  Anticipated discharge in approximately 2-3 day(s).   The patient does have a current PCP Mia Creek, MD) and does need an [facility] hospital follow-up appointment after discharge.  The patient does not have transportation  limitations that hinder transportation to clinic appointments.  .Services Needed at time of discharge: Y = Yes, Blank = No PT:   OT:   RN:   Equipment:   Other:     LOS: 2 days   Lorretta Harp, MD 09/17/2012, 11:40 AM Pager: [phone]

## 2012-09-17 NOTE — Progress Notes (Signed)
Subjective: No acute events.  No reports of any vomiting.  Objective: Vital signs in last 24 hours: Temp:  [98.2 F (36.8 C)-98.8 F (37.1 C)] 98.6 F (37 C) (08/17 0800) Pulse Rate:  [83-109] 103 (08/17 0900) Resp:  [14-27] 24 (08/17 0900) BP: (100-114)/(32-76) 104/32 mmHg (08/17 0800) SpO2:  [92 %-100 %] 95 % (08/17 0900) FiO2 (%):  [96 %] 96 % (08/16 1930) Last BM Date: 09/17/12  Intake/Output from previous day: 08/16 0701 - 08/17 0700 In: 1650 [I.V.:1150; IV Piggyback:500] Out: -  Intake/Output this shift: Total I/O In: 200 [I.V.:200] Out: -   General appearance: Noncommunicative.  He does not appear to be in distress. GI: PEG tube intact.  Lab Results:  Recent Labs  09/14/12 1044 09/15/12 0939 09/16/12 0142  WBC 9.6 14.6* 10.4  HGB 17.6* 18.7* 14.0  HCT 51.6 53.3* 42.6  PLT 173.0 181 135*   BMET  Recent Labs  09/15/12 0939 09/16/12 0142 09/17/12 0708  NA 141 137 137  K 3.1* 3.3* 3.2*  CL 91* 98 98  CO2 32 26 27  GLUCOSE 86 89 98  BUN 18 16 7   CREATININE 0.64 0.59 0.47*  CALCIUM 10.6* 8.8 9.0   LFT  Recent Labs  09/17/12 0708  PROT 6.1  ALBUMIN 3.1*  AST 22  ALT 38  ALKPHOS 122*  BILITOT 1.5*   PT/INR  Recent Labs  09/16/12 0142  LABPROT 14.8  INR 1.19   Hepatitis Panel  Recent Labs  09/16/12 0142  HEPBSAG NEGATIVE  HCVAB NEGATIVE  HEPAIGM PENDING  HEPBIGM PENDING   C-Diff No results found for this basename: CDIFFTOX,  in the last 72 hours Fecal Lactopherrin No results found for this basename: FECLLACTOFRN,  in the last 72 hours  Studies/Results: Dg Abd 1 View  09/15/2012   *RADIOLOGY REPORT*  Clinical Data: 22 year old male with cerebral palsy.  Recent G tube exchange, now with emesis.  Query subsequent duodenal obstruction. Post contrast injection through the G tube can be requested  ABDOMEN - 1 VIEW  Comparison: 09/15/2012 at 1045 hours. CT abdomen and pelvis 18[address].  Findings: Water soluble Omnipaque contrast was  injected via the patient's existing G tube prior to this image.  Supine AP view at 1723 hours.  Moderate gaseous distention of the stomach as seen earlier, and also similar to that 2008 comparison.  The internal G tube balloon projects over the lower gastric body and is not appear in proximity of the antrum.  There is contrast in the gastric fundus, at the antrum, and also outlining some small bowel in the left upper quadrant at the level of the ligament of Treitz.  No contrast extravasation or abnormal collection identified.  Otherwise stable bowel gas pattern from earlier.  No dilated loops identified.  There is some retained stool in the colon.  Chronic osseous changes of cerebral palsy with superimposed bilateral posterior spinal fusion rods and right lower quadrant spinal/bone stimulator device.  IMPRESSION: 1.  G-tube placement appears adequate and similar to that in 2008. Gaseous distention of the stomach, but some of the injected water soluble contrast has reached the ligament of Treitz without evidence of obstruction. 2.  Otherwise stable KUB appearance of the abdomen since 1048 hours today.   Original Report Authenticated By: Erskine Speed, M.D.   Ct Abdomen Pelvis W Contrast  09/15/2012   *RADIOLOGY REPORT*  Clinical Data: Assess  CT ABDOMEN AND PELVIS WITH CONTRAST  Technique:  Multidetector CT imaging of the abdomen and pelvis  was performed following the standard protocol during bolus administration of intravenous contrast. Oral contrast was administered.  Contrast: 80mL OMNIPAQUE IOHEXOL 300 MG/ML  SOLN  Comparison: February 08, 2006  Findings: There is artifact from extensive for spinal rod fixation.  There is consolidation throughout much of the right lower lobe. The visualized left base is clear.  Nasogastric tube tip is in the stomach.  Gastrostomy catheter also is positioned in the stomach with balloon distended along the anterior gastric wall.  No focal liver lesions are appreciated.  There is no  biliary duct dilatation.  Spleen, pancreas, and adrenals appear within normal limits.  There is a 4 mm calculus in the lower pole of the right kidney which is nonobstructing.  Kidneys bilaterally otherwise appear within normal limits.  In the pelvis, there is rather marked thickening of the wall of the rectum.  There is also thickening of the wall of the mid to distal sigmoid colon.  There is no surrounding mesenteric inflammation. There is no pelvic mass or fluid collection. Appendix appears normal.  There is a stimulator overlying the right upper pelvis.  There is no bowel obstruction.  There is no apparent free air or portal venous air.  Contrast is seen throughout the colon.  There is no ascites, adenopathy, or abscess apparent in the abdomen or pelvis.  There is marked scoliosis.  No blastic or lytic bone lesions are identified.  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 The acetabular shallow bilaterally.  IMPRESSION: There is rather marked thickening of the wall of the rectum and distal descending colon consistent with colitis and proctitis. There is no abscess in these areas.  There is consolidation throughout much of the right lower lobe.  Gastrostomy catheter is positioned within the stomach.  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  There is marked scoliosis with extensive right fixation.  Note that the rod fixation is causing considerable artifact making this study less than optimal.  There is a nonobstructing 4 mm calculus in the lower pole of the right kidney.  There is no hydronephrosis on either side.  No bowel obstruction.  Appendix appears normal.   Original Report Authenticated By: Bretta Bang, M.D.   Dg Chest Portable 1 View  09/15/2012   *RADIOLOGY REPORT*  Clinical Data: Fever  PORTABLE CHEST - 1 VIEW  Comparison: 09/05/2012  Findings: The previously seen left-sided central venous line has been removed in the  interval.  Postsurgical changes are again seen. The lungs are clear bilaterally.  The cardiac shadow is within normal limits.  IMPRESSION: No acute abnormality noted.   Original Report Authenticated By: Alcide Clever, M.D.   Dg Abd Portable 1v  09/15/2012   *RADIOLOGY REPORT*  Clinical Data: Vomiting.  Fever.  PORTABLE ABDOMEN - 1 VIEW  Comparison: CT, 02/08/2006  Findings: There is a normal bowel gas pattern.  No evidence of obstruction.  The soft tissues are unremarkable.  Long spine stabilization rods are noted with the bony fusion spanning the visualized spine.  This is stable.  A spine stimulator lies in the right lower quadrant with its catheter extending into the lumbar spinal canal and superiorly above the included field of view in the thoracic spinal canal.  This is also stable.  IMPRESSION: No acute findings.  No evidence of obstruction.   Original Report Authenticated By: Amie Portland, M.D.    Medications:  Scheduled: . calcium carbonate (dosed in mg elemental calcium)  1,000 mg of elemental calcium Oral BID WC  . clindamycin (CLEOCIN) IV  600 mg Intravenous Q8H  . gabapentin  800 mg Per Tube TID  . levETIRAcetam  2,000 mg Intravenous Q12H  . multivitamin  10 mL Oral Daily  . pantoprazole (PROTONIX) IV  40 mg Intravenous Q12H  . potassium chloride  10 mEq Intravenous Q1 Hr x 5  . sodium chloride  3 mL Intravenous Q12H  . vitamin C  500 mg Oral QHS   Continuous: . sodium chloride 100 mL/hr at 09/16/12 0857    Assessment/Plan: 1) Nausea/vomiting -resolved. 2) ? Proctitis.   I was not aware that the family discharged the services of Dr. [name] upon admission.  In fact, they do not want to see anybody from [facility].  There were no family members when I rounded in the AM yesterday and it is only today that I found out that they wanted another GI.  Clinically the patient appears to be stable and improved.  Plan: 1) Signing off.  [facility] is on unassigned for [facility].   LOS: 2 days    Saraih Lorton D 09/17/2012, 9:53 AM

## 2012-09-18 ENCOUNTER — Inpatient Hospital Stay (HOSPITAL_COMMUNITY): Payer: Medicaid Other

## 2012-09-18 ENCOUNTER — Telehealth: Payer: Self-pay | Admitting: Neurology

## 2012-09-18 DIAGNOSIS — G40209 Localization-related (focal) (partial) symptomatic epilepsy and epileptic syndromes with complex partial seizures, not intractable, without status epilepticus: Secondary | ICD-10-CM

## 2012-09-18 DIAGNOSIS — J690 Pneumonitis due to inhalation of food and vomit: Secondary | ICD-10-CM

## 2012-09-18 LAB — CBC
HCT: 36.5 % — ABNORMAL LOW (ref 39.0–52.0)
MCHC: 33.2 g/dL (ref 30.0–36.0)
MCV: 97.6 fL (ref 78.0–100.0)
Platelets: 128 10*3/uL — ABNORMAL LOW (ref 150–400)
RDW: 12.6 % (ref 11.5–15.5)
WBC: 5.5 10*3/uL (ref 4.0–10.5)

## 2012-09-18 LAB — GLUCOSE, CAPILLARY
Glucose-Capillary: 70 mg/dL (ref 70–99)
Glucose-Capillary: 92 mg/dL (ref 70–99)

## 2012-09-18 LAB — BASIC METABOLIC PANEL
BUN: 4 mg/dL — ABNORMAL LOW (ref 6–23)
CO2: 24 mEq/L (ref 19–32)
Calcium: 8.7 mg/dL (ref 8.4–10.5)
Chloride: 100 mEq/L (ref 96–112)
Creatinine, Ser: 0.43 mg/dL — ABNORMAL LOW (ref 0.50–1.35)

## 2012-09-18 MED ORDER — POTASSIUM CHLORIDE 10 MEQ/100ML IV SOLN
10.0000 meq | INTRAVENOUS | Status: AC
  Administered 2012-09-18: 10 meq via INTRAVENOUS
  Filled 2012-09-18: qty 100

## 2012-09-18 MED ORDER — GLUCOSE 40 % PO GEL
ORAL | Status: AC
  Administered 2012-09-18: 19:00:00
  Filled 2012-09-18: qty 1

## 2012-09-18 MED ORDER — FREE WATER
250.0000 mL | Freq: Three times a day (TID) | Status: DC
  Administered 2012-09-18 – 2012-09-19 (×4): 250 mL
  Administered 2012-09-20: 125 mL
  Administered 2012-09-20 – 2012-09-22 (×6): 250 mL

## 2012-09-18 MED ORDER — DEXTROSE 50 % IV SOLN
INTRAVENOUS | Status: AC
  Administered 2012-09-18: 25 mL
  Filled 2012-09-18: qty 50

## 2012-09-18 NOTE — Progress Notes (Signed)
At bedside to attempt PIV , mother at bedside stated that patient was going down to IR to get a PICC and didn't want [place] to assess for PIV with ultrasound.

## 2012-09-18 NOTE — Procedures (Signed)
Name:  Jeffery [name] MRN:  [id number] DOB:  02/20/1990  PROCEDURE NOTE  Procedure:  Ultrasound-guided central venous catheter placement.  Indications:  Need for intravenous access and hemodynamic monitoring.  Consent:  Consent was obtained form mother (medical POA).  Anesthesia:  A total of 10 mL of 1% Lidocaine was used for local infiltration anesthesia.  Procedure summary:  Appropriate equipment was assembled.  The patient was identified as Jeffery [name] and safety timeout was performed. The patient was placed in Trendelenburg position.  Sterile technique was used. The patient's left neck region was prepped using chlorhexidine / alcohol scrub and the field was draped in usual sterile fashion with full body drape. The left internal jugular vein and the left carotid artery were identified by ultrasound, the patency was evaluated and images were documented. After the adequate anesthesia was achieved, the vein was cannulated with the introducer needle under sonographic guidance without difficulty. A guide wire was advanced through the introducer needle, which was then withdrawn. A small skin incision was made at the point of wire entry, the dilator was inserted over the guide wire and appropriate dilation was obtained. The dilator was removed and triple-lumen catheter was advanced over the guide wire, which was then removed.  All ports were aspirated and flushed with normal saline without difficulty. The catheter was secured into place with sutures. Antibiotic patch was placed and sterile dressing was applied. Post-procedure chest x-ray was ordered.  Complications:  No immediate complications were noted.  Hemodynamic parameters and oxygenation remained stable throughout the procedure.  Estimated blood loss:  Less then 5 mL.  Orlean Bradford, M.D. Pulmonary and Critical Care Medicine [facility] Pager: [phone]  09/18/2012, 9:40 PM

## 2012-09-18 NOTE — Progress Notes (Signed)
09/18/2012  2010  POA requesting IVF changed to fluids with dextrose d/t periods of hypoglycemia and to have CVC placed by CCM. Attending notified. CCM notified. MIVF to be kept as is. POA made aware.   Carlyon Prows RN

## 2012-09-18 NOTE — Progress Notes (Signed)
Repeat cbg 70. Dr. Yetta Barre paged.

## 2012-09-18 NOTE — Progress Notes (Signed)
INITIAL NUTRITION ASSESSMENT  DOCUMENTATION CODES Per approved criteria  -Not Applicable   INTERVENTION:  Once medically appropriate, recommend Jevity 1.2 formula: 1 can QID with Prostat liquid protein 30 ml BID via tube to provide 1340 kcals, 83 gm protein, 764 ml of free water  Recommend free water flushes at 200 ml QID  RD to follow for nutrition care plan  NUTRITION DIAGNOSIS: Inadequate oral intake related to inability to eat as evidenced by home EN support  Goal: EN to meet > 90% of estimated nutrition needs  Monitor:  EN regimen & tolerance, weight, labs, I/O's  Reason for Assessment: Consult, Low Braden  22 y.o. male  Admitting Dx: Hematemesis  ASSESSMENT: Patient with h/o CP, epilepsy who presented with emesis dark in color, low grade fever and lethargy; symptoms started in relation to changing out of his PEG tube which was replaced on 8/13; CT scan likely aspiration pneumonia.  RD spoke with patient's legal guardian; PTA patient was receiving Ensure Plus: 4 cans daily with 1 packet of Beneprotein 4 times daily via PEG tube = 1500 kcals, 76 gm protein; per guardian patient has lost ~ 14 lbs in the past week (more than likely due to dehydration).  RD re-weighed patient on current bed and he is 116 lbs.  RD spoke with Dr. Yetta Barre via telephone.  Plan is to give him free water flushes and more than likely re-initiate tube feeding tomorrow.  RD notified patient's guardian Cheval Lions) of this plan.  Height: Ht Readings from Last 1 Encounters:  09/15/12 4\' 11"  (1.499 m)    Weight: Wt Readings from Last 1 Encounters:  09/15/12 103 lb 9.9 oz (47 kg)    Ideal Body Weight: 98 lb  % Ideal Body Weight: 105%  Wt Readings from Last 10 Encounters:  09/15/12 103 lb 9.9 oz (47 kg)  09/13/12 120 lb (54.432 kg)  09/13/12 120 lb (54.432 kg)  04/21/12 120 lb (54.432 kg)    Usual Body Weight: 120 lb  % Usual Body Weight: 85%  BMI:  Body mass index is 20.92  kg/(m^2).  Estimated Nutritional Needs: Kcal: 1300-1500 Protein: 75-85 gm Fluid: >/= 1.5 L  Skin: Stage I pressure ulcer to heel  Diet Order: NPO  EDUCATION NEEDS: -No education needs identified at this time   Intake/Output Summary (Last 24 hours) at 09/18/12 1054 Last data filed at 09/17/12 2300  Gross per 24 hour  Intake   1150 ml  Output    200 ml  Net    950 ml    Labs:   Recent Labs Lab 09/16/12 0142 09/17/12 0708 09/18/12 0350  NA 137 137 137  K 3.3* 3.2* 3.3*  CL 98 98 100  CO2 26 27 24   BUN 16 7 4*  CREATININE 0.59 0.47* 0.43*  CALCIUM 8.8 9.0 8.7  MG  --  1.8  --   GLUCOSE 89 98 66*    CBG (last 3)   Recent Labs  09/18/12 0825 09/18/12 0953  GLUCAP 77 70    Scheduled Meds: . calcium carbonate (dosed in mg elemental calcium)  1,000 mg of elemental calcium Oral BID WC  . clindamycin (CLEOCIN) IV  600 mg Intravenous Q8H  . gabapentin  800 mg Per Tube TID  . levETIRAcetam  2,000 mg Intravenous Q12H  . multivitamin  10 mL Oral Daily  . pantoprazole (PROTONIX) IV  40 mg Intravenous Q12H  . potassium chloride  10 mEq Intravenous Q1 Hr x 2  . sodium chloride  3 mL Intravenous Q12H  . vitamin C  500 mg Oral QHS    Continuous Infusions: . sodium chloride 100 mL/hr at 09/16/12 2130    Past Medical History  Diagnosis Date  . CP (cerebral palsy), spastic, quadriplegic   . Epilepsy   . Osteoporosis   . Inguinal hernia   . Undescended testes   . Seasonal allergies   . IVH (intraventricular hemorrhage)     Grade IV  . Hip dislocation, bilateral   . Pneumonia   . Seizures   . Hx: UTI (urinary tract infection)   . Dysphagia   . Otitis media   . Retinopathy of prematurity   . 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 Neuromuscular scoliosis   . Osteoporosis   . Complex partial seizures   . Generalized convulsive epilepsy without mention of intractable epilepsy   . Epilepsy   . Sinus bradycardia     HR drops to 38-40 while sleeping  .  Sleep apnea     BiPAP  . Blister of right heel     fluid filled; origin unknown  . Kidney stones     ?    Past Surgical History  Procedure Laterality Date  . Excision of moles    . Peg placement      With multiple changes  . Gastrostomy tube, change / reposition  12/15/2010       . Button change  12/15/2010    Procedure: BUTTON CHANGE;  Surgeon: Iva Boop, MD;  Location: [facility];  Service: Endoscopy;  Laterality: N/A;  . Peg placement  10/07/2011    Procedure: PERCUTANEOUS ENDOSCOPIC GASTROSTOMY (PEG) REPLACEMENT;  Surgeon: Hart Carwin, MD;  Location: [facility];  Service: Endoscopy;  Laterality: N/A;  Needs 18 F 2.5 button ordered-dl  . Inguinal hernia repair Bilateral 1992  . Retinopathy of prematurity surgery  1992  . Achilles tendon lengthening  12/1998  . Soft tissue releases  wrists and fingers  12/1998  . Intrathecal baclofen pump placement  07/25/2000  . Peg placement N/A 06/05/2012    Procedure: PERCUTANEOUS ENDOSCOPIC GASTROSTOMY (PEG) REPLACEMENT;  Surgeon: Hart Carwin, MD;  Location: [facility];  Service: Endoscopy;  Laterality: N/A;  button 96fr.2.5cm  . Eye surgery Bilateral     Retinal   . Back surgery      Harrington Rods in back needs to be log rolled  . Tendon repair Bilateral 09/05/2012    Procedure: LENGTHENING OF DIGITAL FLEXOR TENDONS BILTERAL HANDS;  Surgeon: Jodi Marble, MD;  Location: [facility] OR;  Service: Orthopedics;  Laterality: Bilateral;  . Hand surgery Bilateral Aug. 2014  . Peg placement N/A 09/13/2012    Procedure: PERCUTANEOUS ENDOSCOPIC GASTROSTOMY (PEG) REPLACEMENT;  Surgeon: Hart Carwin, MD;  Location: [facility];  Service: Endoscopy;  Laterality: N/A;    Maureen Chatters, RD, LDN Pager #: [phone] After-Hours Pager #: [phone]

## 2012-09-18 NOTE — Progress Notes (Signed)
Dressing removed by mom.

## 2012-09-18 NOTE — Progress Notes (Signed)
Internal Medicine Attending  Date: 09/18/2012  Patient name: Jeffery [name] Medical record number: [id number] Date of birth: 1990/09/23 Age: 22 y.o. Gender: male  I saw and evaluated the patient. I reviewed the resident's note by Dr. Yetta Barre and I agree with the plans as documented in his note.

## 2012-09-18 NOTE — Progress Notes (Signed)
Unable to start peripheral IV, order for PICC line to be placed. In order for PICC to be placed tonight, attending needs to call radiology.. Dr. Yetta Barre advised.

## 2012-09-18 NOTE — Progress Notes (Signed)
Dr. Yetta Barre called and states the PICC line will be inserted around 8 tonight by Dr Christel Mormon.

## 2012-09-18 NOTE — Progress Notes (Signed)
Dr. Yetta Barre advised of cbg of 36. Instructed to repeat cbg in 1 hr.

## 2012-09-18 NOTE — Progress Notes (Signed)
Cbg 114 

## 2012-09-18 NOTE — Progress Notes (Signed)
Asked by eMD to evaluate for CVL.  Discussed with patient's mother who is medical POA and home health nurse. She requests PICC and does not want central line at this time.  Per her request I discussed the case with IR physician on-call, how stated PICC cannot be placed until tomorrow morning.  That was communicated to patient's mother who then wanted to wait until the morning when PICC can be placed.  I told her that I am available to do CVL later on tonight if she changes her mind.  I also asked RN to notify primary service.

## 2012-09-18 NOTE — Progress Notes (Signed)
Subjective: [name] is a 22 y.o. male w/ PMHx of Cerebral Palsy (spastic), epilepsy, and osteoporosis, presents to the ED w/ legal guardian w/ complaints of dark emesis, low grade fever, and lethargy for 2 days prior to admission. The patient has had a PEG tube since 2000 and recently had it replaced on 09/13/12 at [facility]. Since that time, it is reported that he has had vomiting w/ almost every tube feed (15-20 times in the last 2 days), resulting in a dark brown/maroon colored emesis. Patient was shown to have RLL consolidation on CT, leukocytosis on admission, low grade fever, and history of aspiration pneumonia in the past.   Patient seen at bedside this AM. Appears significantly improved clinically. Mother at bedside. Says he seems much better. No nausea or vomiting this AM. Patient afebrile. PEG site shows no erythema or leakage. No seizures since Keppra dosage was changed on 09/15/12.   K+ 3.3 today. Received 50 mEq yesterday via IVPB. All other labs wnl. Hb 12.1, most likely d/t fluid administration.   Patient slightly hypoglycemic this morning, b/w 60-77.   Objective: Vital signs in last 24 hours: Filed Vitals:   09/18/12 0000 09/18/12 0021 09/18/12 0400 09/18/12 0800  BP: 99/55 99/55 110/59   Pulse: 72 84 86   Temp:  97.6 F (36.4 C) 97.9 F (36.6 C) 97.7 F (36.5 C)  TempSrc:  Axillary Axillary Axillary  Resp: 15 15 16    Height:      Weight:      SpO2: 98% 98% 98%    Weight change:   Intake/Output Summary (Last 24 hours) at 09/18/12 1030 Last data filed at 09/17/12 2300  Gross per 24 hour  Intake   1150 ml  Output    200 ml  Net    950 ml   Physical Exam: General: More alert, in no apparent distress. Patient has Cerebral Palsy, is nonverbal and immobile. HEENT: Oropharynx clear and non-erythematous  Lungs: Clear to ascultation bilaterally, normal work of respiration, no wheezes, rales, ronchi Heart: Regular rate and rhythm, no murmurs, gallops, or  rubs Abdomen: Soft, non-tender, non-distended, normal bowel sounds. PEG in place. Site looks clean w/out leak. Extremities: No cyanosis, clubbing, or edema. Patient w/ structural abnormalities involving the extremities. Both LE w/ contractures, UE's currently bandaged 2/2 recent tendon release surgery. Neurologic: Patient w/ cerebral palsy. More alert than previous examination. Responsive to verbal stimuli. Unable to properly assess neurological function.  Lab Results: Basic Metabolic Panel:  Recent Labs Lab 09/16/12 0142 09/17/12 0708 09/18/12 0350  NA 137 137 137  K 3.3* 3.2* 3.3*  CL 98 98 100  CO2 26 27 24   GLUCOSE 89 98 66*  BUN 16 7 4*  CREATININE 0.59 0.47* 0.43*  CALCIUM 8.8 9.0 8.7  MG  --  1.8  --    Liver Function Tests:  Recent Labs Lab 09/16/12 0142 09/17/12 0708  AST 29 22  ALT 56* 38  ALKPHOS 138* 122*  BILITOT 1.3* 1.5*  PROT 6.5 6.1  ALBUMIN 3.3* 3.1*    Recent Labs Lab 09/15/12 0939  LIPASE 31   No results found for this basename: AMMONIA,  in the last 168 hours CBC:  Recent Labs Lab 09/15/12 0939 09/16/12 0142 09/18/12 0350  WBC 14.6* 10.4 5.5  NEUTROABS 12.5* 8.5*  --   HGB 18.7* 14.0 12.1*  HCT 53.3* 42.6 36.5*  MCV 96.7 99.8 97.6  PLT 181 135* 128*   Coagulation:  Recent Labs Lab 09/16/12  0142  LABPROT 14.8  INR 1.19   Urinalysis:  Recent Labs Lab 09/15/12 1300  COLORURINE AMBER*  LABSPEC 1.036*  PHURINE 5.5  GLUCOSEU NEGATIVE  HGBUR NEGATIVE  BILIRUBINUR SMALL*  KETONESUR 15*  PROTEINUR 100*  UROBILINOGEN 1.0  NITRITE NEGATIVE  LEUKOCYTESUR TRACE*    Micro Results: Recent Results (from the past 240 hour(s))  CLOSTRIDIUM DIFFICILE BY PCR     Status: None   Collection Time    09/14/12 10:45 AM      Result Value Range Status   C difficile by pcr Not Detected  Not Detected Final   Comment:       This assay detects the presence of Clostridium difficile DNA coding     for toxin B (tcdB) by real-time  polymerase chain reaction (PCR)     amplification.     This test was developed and its performance characteristics have been     determined by [facility]. Performance characteristics refer     to the analytical performance of the test. This test has not been     cleared or approved by the [place] Food and Drug Administration. The FDA     has determined that such clearance or approval is not necessary. This     laboratory is certified under the Clinical Laboratory Improvement     Amendments of 1988 as qualified to perform high complexity clinical     laboratory testing.  CULTURE, BLOOD (SINGLE)     Status: None   Collection Time    09/15/12 11:30 AM      Result Value Range Status   Specimen Description BLOOD LEFT ARM   Final   Special Requests BOTTLES DRAWN AEROBIC ONLY 10CC   Final   Culture  Setup Time     Final   Value: 09/16/2012 01:03     Performed at [facility]   Culture     Final   Value:        BLOOD CULTURE RECEIVED NO GROWTH TO DATE CULTURE WILL BE HELD FOR 5 DAYS BEFORE ISSUING A FINAL NEGATIVE REPORT     Performed at [facility]   Report Status PENDING   Incomplete  MRSA PCR SCREENING     Status: None   Collection Time    09/16/12  2:44 AM      Result Value Range Status   MRSA by PCR NEGATIVE  NEGATIVE Final   Comment:            The GeneXpert MRSA Assay (FDA     approved for NASAL specimens     only), is one component of a     comprehensive MRSA colonization     surveillance program. It is not     intended to diagnose MRSA     infection nor to guide or     monitor treatment for     MRSA infections.   Studies/Results: No results found. Medications: I have reviewed the patient's current medications. Scheduled Meds: . calcium carbonate (dosed in mg elemental calcium)  1,000 mg of elemental calcium Oral BID WC  . clindamycin (CLEOCIN) IV  600 mg Intravenous Q8H  . gabapentin  800 mg Per Tube TID  . levETIRAcetam  2,000 mg Intravenous Q12H   . multivitamin  10 mL Oral Daily  . pantoprazole (PROTONIX) IV  40 mg Intravenous Q12H  . potassium chloride  10 mEq Intravenous Q1 Hr x 2  . sodium chloride  3 mL Intravenous Q12H  .  vitamin C  500 mg Oral QHS   Continuous Infusions: . sodium chloride 100 mL/hr at 09/16/12 0857   PRN Meds:.sodium chloride, albuterol, sodium chloride  Assessment/Plan: 22 y.o. male w/ PMHx of Cerebral Palsy (spastic), epilepsy, and osteoporosis, presents to the ED w/ legal guardian w/ complaints of dark emesis, low grade fever, and lethargy for 2 days prior to admission.  #Dark brown emesis w/ elevated LFT's- Patient w/ recent vomiting, s/p PEG replacement on 09/13/12. Emesis appeared dark brown and slightly maroon and occurs after every tube feed. Most likely 2/2 gastric mucosal irritation d/t new PEG placement. CT abdomen showed no signs of cholecystitis or gastric inflammation. PEG shown to be in proper position. No vomiting for the past 2 days.  -Will start free water today via PEG to see if pt. tolerates.  -GI was consulted. Recommend supportive care at this time. If vomiting continues w/ coffee ground appearance, EGD will be performed.  -LFTs on 09/17/12 show significant improvement. AST 29, ALT 56, total protein 6.5, and total bili 1.3. -Continue Protonix 40 mg IV bid  -Given Zofran 4 mg prn  -Continue NS @ 75 ml/hr   #Possible aspiration pneumonia- Patient has a known history of aspiration pneumonia in the past. His guardian records his SpO2 at home and reports a recent decrease in O2 sat in the last day. In ED, SpO2 was 88%, placed on 4L O2 via [place], now satting in the mid 90's. CXR clear, showing no acute cardiopulmonary disease, however, the patient has leukocytosis, low grade fever, and recent frequent vomiting. CT showed RLL infiltrate, concerning for aspiration. -Continue Clindamycin 600 mg IV q8h. -Restarted free water through PEG today. Remove NG tube.  #Epilepsy- Patient has a known history of  seizure disorder. The guardian claims that his seizures are well controlled on Keppra 1,750 mg by PEG Tube bid. He only has about 4 seizures per year. He did have a reported seizure today, resulting in UE and head convulsions, most likely d/t his recent vomiting after every tube feed, including medication administration.  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 He has contractures in all extremities and does respond visually to pain. 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 He is usually awake and alert, but he presents to the ED much more lethargic than his baseline. He receives Botox injections infrequently for spasticity, baclofen intrathecally, and neurontin.  -Stable at this time.  Dispo: Disposition is deferred at this time, awaiting improvement of current medical problems.  Anticipated discharge in approximately 2-3 day(s).   The patient does have a current PCP Mia Creek, MD) and does need an [facility] hospital follow-up appointment after discharge.  The patient does not have transportation limitations that hinder transportation to clinic appointments.  .Services Needed at time of discharge: Y = Yes, Blank = No PT:   OT:   RN:   Equipment:   Other:     LOS: 3 days   Lars Masson, MD 09/18/2012, 10:30 AM Pager: [phone]

## 2012-09-18 NOTE — Progress Notes (Signed)
cbg 77  

## 2012-09-18 NOTE — Telephone Encounter (Signed)
The patient is in the hospital.  There was a problem with obstruction related to the tube placement; patient had old blood and vomiting with aspiration.  He also had a series of seizures and his Keppra dose has been increased.  I think seizures occurred because he went without medication for couple of days because he could not absorb it due to vomiting.  He is apparently on a higher dose of 2000 twice daily.  I don't have any problem with this but he probably doesn't need a higher dose, just a consistent dose which should be possible now that the feeding tube is working.  I'll be happy to speak with the hospitalist taking care of him.

## 2012-09-19 ENCOUNTER — Inpatient Hospital Stay (HOSPITAL_COMMUNITY): Payer: Medicaid Other

## 2012-09-19 ENCOUNTER — Telehealth: Payer: Self-pay | Admitting: Family

## 2012-09-19 DIAGNOSIS — J9811 Atelectasis: Secondary | ICD-10-CM | POA: Diagnosis not present

## 2012-09-19 DIAGNOSIS — J690 Pneumonitis due to inhalation of food and vomit: Principal | ICD-10-CM

## 2012-09-19 DIAGNOSIS — J9819 Other pulmonary collapse: Secondary | ICD-10-CM

## 2012-09-19 LAB — CBC
HCT: 38 % — ABNORMAL LOW (ref 39.0–52.0)
Hemoglobin: 12.9 g/dL — ABNORMAL LOW (ref 13.0–17.0)
MCH: 32.8 pg (ref 26.0–34.0)
MCHC: 33.9 g/dL (ref 30.0–36.0)
MCV: 96.7 fL (ref 78.0–100.0)
Platelets: 138 10*3/uL — ABNORMAL LOW (ref 150–400)
RBC: 3.93 MIL/uL — ABNORMAL LOW (ref 4.22–5.81)
RDW: 12.6 % (ref 11.5–15.5)
WBC: 4.4 10*3/uL (ref 4.0–10.5)

## 2012-09-19 LAB — COMPREHENSIVE METABOLIC PANEL
BUN: 3 mg/dL — ABNORMAL LOW (ref 6–23)
CO2: 29 mEq/L (ref 19–32)
Calcium: 8.8 mg/dL (ref 8.4–10.5)
Chloride: 100 mEq/L (ref 96–112)
Creatinine, Ser: 0.44 mg/dL — ABNORMAL LOW (ref 0.50–1.35)
GFR calc Af Amer: >90 mL/min (ref >90)
GFR calc non Af Amer: >90 mL/min (ref >90)
Glucose, Bld: 78 mg/dL (ref 70–99)
Total Bilirubin: 0.6 mg/dL (ref 0.3–1.2)

## 2012-09-19 MED ORDER — ALBUTEROL SULFATE (5 MG/ML) 0.5% IN NEBU
2.5000 mg | INHALATION_SOLUTION | Freq: Four times a day (QID) | RESPIRATORY_TRACT | Status: DC
  Administered 2012-09-19 – 2012-09-22 (×13): 2.5 mg via RESPIRATORY_TRACT
  Filled 2012-09-19 (×12): qty 0.5

## 2012-09-19 MED ORDER — POTASSIUM CHLORIDE 20 MEQ/15ML (10%) PO LIQD
40.0000 meq | Freq: Two times a day (BID) | ORAL | Status: DC
  Administered 2012-09-19 – 2012-09-20 (×4): 40 meq
  Filled 2012-09-19 (×6): qty 30

## 2012-09-19 MED ORDER — DEXTROSE-NACL 5-0.9 % IV SOLN
INTRAVENOUS | Status: DC
  Administered 2012-09-19 – 2012-09-21 (×2): via INTRAVENOUS

## 2012-09-19 MED ORDER — ALBUTEROL SULFATE (5 MG/ML) 0.5% IN NEBU: 2.5000 mg | INHALATION_SOLUTION | RESPIRATORY_TRACT | Status: DC | PRN

## 2012-09-19 MED ORDER — SODIUM CHLORIDE 0.9 % IV SOLN
3.0000 g | Freq: Four times a day (QID) | INTRAVENOUS | Status: DC
  Administered 2012-09-19 – 2012-09-22 (×11): 3 g via INTRAVENOUS
  Filled 2012-09-19 (×14): qty 3

## 2012-09-19 NOTE — Telephone Encounter (Signed)
[name] called and told [name], who answered the phone, that Jeffery [name] was dying. She demanded to speak to Dr [name]. She sounded as if she was crying and she would not talk to me. She said that she would only talk to Dr [name] and wanted to talk to him now. I told her that he was not in the office at this time and she hung up on me. I called her back and she repeated that she would only talk with Dr [name] and hung up. [name] number is [phone]. [name]

## 2012-09-19 NOTE — Progress Notes (Signed)
Mom requesting wash cloths to be put in the microwave and upset because staff instructed her that per policy it could not be done. Jeffery [name]. Dept Director in to see.

## 2012-09-19 NOTE — Progress Notes (Signed)
Spoke with pt's father regarding removal of NG tube as ordered by MD.  Pt's father requested that tube not be removed until tube feedings have been resumed to ensure pt does not begin vomiting again.  Aneisha Skyles, [facility]

## 2012-09-19 NOTE — Progress Notes (Signed)
Subjective: [name] is a 22 y.o. male w/ PMHx of Cerebral Palsy (spastic), epilepsy, and osteoporosis, presents to the ED w/ legal guardian w/ complaints of dark emesis, low grade fever, and lethargy for 2 days prior to admission. The patient has had a PEG tube since 2000 and recently had it replaced on 09/13/12 at [facility]. Since that time, it is reported that he has had vomiting w/ almost every tube feed (15-20 times in the last 2 days), resulting in a dark brown/maroon colored emesis. Patient was shown to have RLL consolidation on CT, leukocytosis on admission, low grade fever, and history of aspiration pneumonia in the past.   Patient seen at bedside this AM. Still appears improved clinically. Smiling this AM. Patient appears in no distress. Central line placed overnight d/t loss of peripheral IV. No complications w/ procedure. Repeat CXR showed significant worsening of right lung w/ complete opacification of the right lung. Patient still on Clindamycin for aspiration coverage. CXR findings likely 2/2 chemical pneumonitis d/t aspiration.  Tolerating free water through PEG. Will restart tube feeds tomorrow in AM.  Objective: Vital signs in last 24 hours: Filed Vitals:   09/18/12 1600 09/18/12 2017 09/19/12 0000 09/19/12 0045  BP: 124/71 124/71 105/58   Pulse:  100    Temp: 98.9 F (37.2 C) 98.9 F (37.2 C)  98.9 F (37.2 C)  TempSrc: Axillary Axillary  Axillary  Resp: 16 17    Height:      Weight:      SpO2: 98% 96%     Weight change:   Intake/Output Summary (Last 24 hours) at 09/19/12 0731 Last data filed at 09/19/12 0600  Gross per 24 hour  Intake   1220 ml  Output      0 ml  Net   1220 ml   Physical Exam: General: More alert, in no apparent distress. Patient has Cerebral Palsy, is nonverbal and immobile. HEENT: Oropharynx clear and non-erythematous  Lungs: Decreased breath sounds on right side, with some harsh breath sounds heard. Right lung clear to auscultation. No  wheeze. On 2L [place] and SpO2 @ 95%. Heart: Regular rate and rhythm, no murmurs, gallops, or rubs Abdomen: Soft, non-tender, non-distended, normal bowel sounds. PEG in place. Site looks clean w/out leak. Extremities: No cyanosis, clubbing, or edema. Patient w/ structural abnormalities involving the extremities. Both LE w/ contractures, UE bandages removed, tendon release incisions appear clean, dry and intact bilaterally. Neurologic: Patient w/ cerebral palsy. Patient smiling this AM. More responsive to verbal stimuli. Unable to properly assess neurological function.  Lab Results: Basic Metabolic Panel:  Recent Labs Lab 09/16/12 0142 09/17/12 0708 09/18/12 0350 09/19/12 0500  NA 137 137 137 140  K 3.3* 3.2* 3.3* 3.0*  CL 98 98 100 100  CO2 26 27 24 29   GLUCOSE 89 98 66* 78  BUN 16 7 4* 3*  CREATININE 0.59 0.47* 0.43* 0.44*  CALCIUM 8.8 9.0 8.7 8.8  MG  --  1.8  --   --    Liver Function Tests:  Recent Labs Lab 09/17/12 0708 09/19/12 0500  AST 22 16  ALT 38 21  ALKPHOS 122* 110  BILITOT 1.5* 0.6  PROT 6.1 5.9*  ALBUMIN 3.1* 2.9*    Recent Labs Lab 09/15/12 0939  LIPASE 31   No results found for this basename: AMMONIA,  in the last 168 hours CBC:  Recent Labs Lab 09/15/12 0939 09/16/12 0142 09/18/12 0350 09/19/12 0500  WBC 14.6* 10.4 5.5 4.4  NEUTROABS 12.5* 8.5*  --   --   HGB 18.7* 14.0 12.1* 12.9*  HCT 53.3* 42.6 36.5* 38.0*  MCV 96.7 99.8 97.6 96.7  PLT 181 135* 128* 138*   Coagulation:  Recent Labs Lab 09/16/12 0142  LABPROT 14.8  INR 1.19   Urinalysis:  Recent Labs Lab 09/15/12 1300  COLORURINE AMBER*  LABSPEC 1.036*  PHURINE 5.5  GLUCOSEU NEGATIVE  HGBUR NEGATIVE  BILIRUBINUR SMALL*  KETONESUR 15*  PROTEINUR 100*  UROBILINOGEN 1.0  NITRITE NEGATIVE  LEUKOCYTESUR TRACE*    Micro Results: Recent Results (from the past 240 hour(s))  CLOSTRIDIUM DIFFICILE BY PCR     Status: None   Collection Time    09/14/12 10:45 AM      Result  Value Range Status   C difficile by pcr Not Detected  Not Detected Final   Comment:       This assay detects the presence of Clostridium difficile DNA coding     for toxin B (tcdB) by real-time polymerase chain reaction (PCR)     amplification.     This test was developed and its performance characteristics have been     determined by [facility]. Performance characteristics refer     to the analytical performance of the test. This test has not been     cleared or approved by the [place] Food and Drug Administration. The FDA     has determined that such clearance or approval is not necessary. This     laboratory is certified under the Clinical Laboratory Improvement     Amendments of 1988 as qualified to perform high complexity clinical     laboratory testing.  CULTURE, BLOOD (SINGLE)     Status: None   Collection Time    09/15/12 11:30 AM      Result Value Range Status   Specimen Description BLOOD LEFT ARM   Final   Special Requests BOTTLES DRAWN AEROBIC ONLY 10CC   Final   Culture  Setup Time     Final   Value: 09/16/2012 01:03     Performed at [facility]   Culture     Final   Value:        BLOOD CULTURE RECEIVED NO GROWTH TO DATE CULTURE WILL BE HELD FOR 5 DAYS BEFORE ISSUING A FINAL NEGATIVE REPORT     Performed at [facility]   Report Status PENDING   Incomplete  MRSA PCR SCREENING     Status: None   Collection Time    09/16/12  2:44 AM      Result Value Range Status   MRSA by PCR NEGATIVE  NEGATIVE Final   Comment:            The GeneXpert MRSA Assay (FDA     approved for NASAL specimens     only), is one component of a     comprehensive MRSA colonization     surveillance program. It is not     intended to diagnose MRSA     infection nor to guide or     monitor treatment for     MRSA infections.  STOOL CULTURE     Status: None   Collection Time    09/18/12  2:03 PM      Result Value Range Status   Specimen Description STOOL   Final   Special  Requests NONE   Final   Culture PENDING   Incomplete   Report Status PENDING  Incomplete   Studies/Results: Dg Chest Port 1 View  09/18/2012   *RADIOLOGY REPORT*  Clinical Data: Central line placement.  PORTABLE CHEST - 1 VIEW  Comparison: Chest x-ray dated 09/15/2012 and CT scan of the abdomen and pelvis dated 09/15/2012  Findings:  Left-sided central line is in place and is probably in the right atrium.  The patient now has opacified the entire right lung with increased shift of the heart and mediastinal structures to the right.  Left lung is clear.  No pneumothorax.  NG tube tip is below the diaphragm.  IMPRESSION:  1.  There is now complete opacification of the right hemithorax, probably a combination of lung consolidation and atelectasis. 2.  Central line tip is most likely in the right atrium and could be retracted approximately 3 cm.   Original Report Authenticated By: Francene Boyers, M.D.   Medications: I have reviewed the patient's current medications. Scheduled Meds: . calcium carbonate (dosed in mg elemental calcium)  1,000 mg of elemental calcium Oral BID WC  . clindamycin (CLEOCIN) IV  600 mg Intravenous Q8H  . free water  250 mL Per Tube Q8H  . gabapentin  800 mg Per Tube TID  . levETIRAcetam  2,000 mg Intravenous Q12H  . multivitamin  10 mL Oral Daily  . pantoprazole (PROTONIX) IV  40 mg Intravenous Q12H  . potassium chloride  40 mEq Per Tube BID  . sodium chloride  3 mL Intravenous Q12H  . vitamin C  500 mg Oral QHS   Continuous Infusions: . sodium chloride 100 mL/hr at 09/18/12 2200   PRN Meds:.sodium chloride, albuterol, sodium chloride  Assessment/Plan: 22 y.o. male w/ PMHx of Cerebral Palsy (spastic), epilepsy, and osteoporosis, presents to the ED w/ legal guardian w/ complaints of dark emesis, low grade fever, and lethargy for 2 days prior to admission.  #Dark brown emesis w/ elevated LFT's- Patient w/ vomiting at home, s/p PEG replacement on 09/13/12. Emesis appeared  dark brown and slightly maroon and occurs after every tube feed. Most likely 2/2 gastric mucosal irritation d/t new PEG placement. CT abdomen showed no signs of cholecystitis or gastric inflammation. PEG shown to be in proper position. No vomiting reported overnight. -Patient tolerating free water via PEG. GI consulted, said it is okay to restart tube feeds. Will do so tomorrow in AM. -LFTs this AM show significant improvement. AST 16, ALT 21, total protein 5.9, and total bili 0.6. -Continue Protonix 40 mg IV bid  -Given Zofran 4 mg prn  -Started D5NS @ 100 ml/hr  #Possible aspiration pneumonia- Patient has a known history of aspiration pneumonia in the past. Repeat CXR overnight shows significant worsening of right lung, w/ complete opacification of RL, most likely d/t chemical pneumonitis 2/2 aspiration. -Continue Clindamycin 600 mg IV q8h. -Consulted Pulmonary, said CXR findings may be d/t possible mucus plug causing decreased respiration of R lung. Also instructed to start Unasyn. Giving 3g IV q6h. -Tolerating free water through PEG. Advance to tube feeds tomorrow in AM.  #Epilepsy- Patient has a known history of seizure disorder. The guardian claims that his seizures are well controlled on Keppra 1,750 mg by PEG Tube bid. He only has about 4 seizures per year. No reported seizures since Keppra dose was adjusted. -Continue Keppra 2000 mg IVPB bid for now. Patient's neurologist left a note saying that he does not need increased dose of Keppra. Will continue 2000 bid for now to keep patient seizure-free during admission. Restart home dose on discharge. 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 He has contractures in all extremities and does respond visually to pain. 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 He is usually awake and alert, but he presents to the ED much  more lethargic than his baseline. He receives Botox injections infrequently for spasticity, baclofen intrathecally, and neurontin.  -Stable at this time.  Dispo: Disposition is deferred at this time, awaiting improvement of current medical problems.  Anticipated discharge in approximately 2-3 day(s).   The patient does have a current PCP Mia Creek, MD) and does need an [facility] hospital follow-up appointment after discharge.  The patient does not have transportation limitations that hinder transportation to clinic appointments.  .Services Needed at time of discharge: Y = Yes, Blank = No PT:   OT:   RN:   Equipment:   Other:     LOS: 4 days   Lars Masson, MD 09/19/2012, 7:31 AM Pager: [phone]

## 2012-09-19 NOTE — Telephone Encounter (Signed)
I called and spoke with the mother.  The patient apparently has a pneumothorax and plans were made to put in a chest tube inform bronchoscopy.  He stabilized, and those procedures or not necessary at this time.  I made it clear to the patient's mother that she should speak with [name] if she has a message that needs to get to me.

## 2012-09-19 NOTE — Progress Notes (Signed)
eLink Physician-Brief Progress Note Patient Name: Jeffery [name] DOB: 10-28-1990 MRN: [id number]  Date of Service  09/19/2012   HPI/Events of Note  Pt on bipap at home   eICU Interventions  Ordered qhs bipap per home settings   Intervention Category Minor Interventions: Routine modifications to care plan (e.g. PRN medications for pain, fever)  Shan Levans 09/19/2012, 4:50 PM

## 2012-09-19 NOTE — Progress Notes (Signed)
I helped PT with home BIPAP machine, Pt is currently set on 12/7 with 2 lpm o2 bleed in, in no distress with o2 sats 92%. Machine looked good, good lines. Pt resting well. RT will continue to assist as needed.  Ellen Henri RRt RCP

## 2012-09-19 NOTE — Progress Notes (Signed)
cbg 77 . Dr. Yetta Barre called. Ok to give insta glucose by PEG tube.

## 2012-09-19 NOTE — Progress Notes (Signed)
Mom frequently requesting for PICC line to be placed. Dr. Yetta Barre advised. Refused for IV team to insert peripheral IV.

## 2012-09-19 NOTE — Consult Note (Signed)
PULMONARY  / CRITICAL CARE MEDICINE  Name: Jeffery [name] MRN: [id number] DOB: [birth year]    ADMISSION DATE:  09/15/2012 CONSULTATION DATE:  8/19  REFERRING MD :  Drue Second   PRIMARY SERVICE:  Family medicine teaching service   CHIEF COMPLAINT:  Hypoxia   BRIEF PATIENT DESCRIPTION:  22 y.o. male w/ PMHx of Cerebral Palsy (spastic), epilepsy, and osteoporosis, presented to the ED on 8/15,  w/ legal guardian w/ report of dark emesis, low grade fever, and lethargy for 2 days prior to admission. This spontaneously resolved..Course complicated by break-thru seizure on 8/15, and progressive hypoxia. F/u CXR showed marked atelectasis w/ complete opacification of right hemithorax. PCCM asked to see for atx and hypoxia.   He has digital spasticity and flexion contractures requiring intrathecal baclofen pump.(Hickling) .  SIGNIFICANT EVENTS / STUDIES:    LINES / TUBES: Left IJ CVL 8/18  CULTURES:   ANTIBIOTICS: clinda 8/15>>>  HISTORY OF PRESENT ILLNESS:   22 y.o. male w/ PMHx of Cerebral Palsy (spastic), epilepsy, and osteoporosis, presented to the ED on 8/15,  w/ legal guardian w/ report of dark emesis, low grade fever, and lethargy for 2 days prior to admission. The patient has had a PEG tube since 2000 and recently had it replaced on 09/13/12 at [facility]. Since that time, it is reported that he has had vomiting w/ almost every tube feed (15-20 times in the last 2 days prior to admit), resulting in a dark brown/maroon colored emesis, this spontaneously resolved.Marland Kitchen His course was complicated by reported seizure on 8/15,  And progressive hypoxia. On presentation his plain film was essentially clear. He had progressive hypoxia and on 8/18 CXR showed complete white out of right hemithorax for which we were consulted.   PAST MEDICAL HISTORY :  Past Medical History  Diagnosis Date  . CP (cerebral palsy), spastic, quadriplegic   . Epilepsy   . Osteoporosis   . Inguinal hernia   . Undescended testes    . Seasonal allergies   . IVH (intraventricular hemorrhage)     Grade IV  . Hip dislocation, bilateral   . Pneumonia   . Seizures   . Hx: UTI (urinary tract infection)   . Dysphagia   . Otitis media   . Retinopathy of prematurity   . 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 Neuromuscular scoliosis   . Osteoporosis   . Complex partial seizures   . Generalized convulsive epilepsy without mention of intractable epilepsy   . Epilepsy   . Sinus bradycardia     HR drops to 38-40 while sleeping  . Sleep apnea     BiPAP  . Blister of right heel     fluid filled; origin unknown  . Kidney stones     ?   Past Surgical History  Procedure Laterality Date  . Excision of moles    . Peg placement      With multiple changes  . Gastrostomy tube, change / reposition  12/15/2010       . Button change  12/15/2010    Procedure: BUTTON CHANGE;  Surgeon: Iva Boop, MD;  Location: [facility];  Service: Endoscopy;  Laterality: N/A;  . Peg placement  10/07/2011    Procedure: PERCUTANEOUS ENDOSCOPIC GASTROSTOMY (PEG) REPLACEMENT;  Surgeon: Hart Carwin, MD;  Location: [facility];  Service: Endoscopy;  Laterality: N/A;  Needs 18 F 2.5 button ordered-dl  . Inguinal hernia repair Bilateral 1992  . Retinopathy of prematurity surgery  1992  .  Achilles tendon lengthening  12/1998  . Soft tissue releases  wrists and fingers  12/1998  . Intrathecal baclofen pump placement  07/25/2000  . Peg placement N/A 06/05/2012    Procedure: PERCUTANEOUS ENDOSCOPIC GASTROSTOMY (PEG) REPLACEMENT;  Surgeon: Hart Carwin, MD;  Location: [facility];  Service: Endoscopy;  Laterality: N/A;  button 5fr.2.5cm  . Eye surgery Bilateral     Retinal   . Back surgery      Harrington Rods in back needs to be log rolled  . Tendon repair Bilateral 09/05/2012    Procedure: LENGTHENING OF DIGITAL FLEXOR TENDONS BILTERAL HANDS;  Surgeon: Jodi Marble, MD;  Location: [facility];  Service: Orthopedics;  Laterality: Bilateral;   . Hand surgery Bilateral Aug. 2014  . Peg placement N/A 09/13/2012    Procedure: PERCUTANEOUS ENDOSCOPIC GASTROSTOMY (PEG) REPLACEMENT;  Surgeon: Hart Carwin, MD;  Location: [facility];  Service: Endoscopy;  Laterality: N/A;   Prior to Admission medications   Medication Sig Start Date End Date Taking? Authorizing Provider  albuterol (PROVENTIL HFA;VENTOLIN HFA) 108 (90 BASE) MCG/ACT inhaler Inhale 2 puffs into the lungs every 4 (four) hours as needed for shortness of breath.   Yes Historical Provider, MD  albuterol (PROVENTIL) (2.5 MG/3ML) 0.083% nebulizer solution Take 2.5 mg by nebulization every 4 (four) hours as needed for wheezing or shortness of breath.    Yes Historical Provider, MD  Ascorbic Acid (VITAMIN C) 500 MG/5ML LIQD 500 mg by PEG Tube route at bedtime.    Yes Historical Provider, MD  baclofen (GABLOFEN) 40000 MCG/20ML SOLN 326 mcg by Intrathecal route continuous.  05/04/12  Yes Deetta Perla, MD  calcium carbonate, dosed in mg elemental calcium, 1250 MG/5ML 1,000 mg of elemental calcium by PEG Tube route 2 (two) times daily. 4 cc via peg tube   Yes Historical Provider, MD  Diazepam (DIASTAT RE) Place 12.5 mg rectally as needed (grand mal seizure lasting >5 min or compromising).    Yes Historical Provider, MD  furosemide (LASIX) 20 MG tablet 20 mg by PEG Tube route 4 (four) times daily as needed for fluid or edema.    Yes Historical Provider, MD  gabapentin (NEURONTIN) 250 MG/5ML solution 800 mg by PEG Tube route 3 (three) times daily. 16 mL in tube   Yes Historical Provider, MD  lansoprazole (PREVACID SOLUTAB) 30 MG disintegrating tablet Increase to 30 mg BID until PEG replacement 09/07/12  Yes Hart Carwin, MD  levETIRAcetam (KEPPRA) 100 MG/ML solution 1,750 mg by PEG Tube route 2 (two) times daily. 17.5 mls   Yes Historical Provider, MD  Multiple Vitamin (MULTIVITAMIN) LIQD 10 mLs by PEG Tube route daily.   Yes Historical Provider, MD  Multiple Vitamins-Minerals (ZINC PO) 5  mLs by PEG Tube route daily. 244 mg / 5 mL zinc solution   Yes Historical Provider, MD  mupirocin ointment (BACTROBAN) 2 % Apply 1 application topically as needed (to G-T site). 09/07/12  Yes Hart Carwin, MD  OnabotulinumtoxinA (BOTOX IM) Inject 1 mL into the muscle every 3 (three) months.   Yes Historical Provider, MD  OXYGEN-HELIUM IN Inhale into the lungs as needed.   Yes Historical Provider, MD  polyethylene glycol (MIRALAX / GLYCOLAX) packet 17 g by PEG Tube route daily as needed (Constipation).    Yes Historical Provider, MD   Allergies  Allergen Reactions  . Adhesive [Tape]     Rips skin off  . Depakote [Divalproex Sodium]     Causes pancreatitis  FAMILY HISTORY:  Family History  Problem Relation Age of Onset  . Adopted: Yes   SOCIAL HISTORY:  reports that he has never smoked. He has never used smokeless tobacco. He reports that he does not drink alcohol or use illicit drugs.  REVIEW OF SYSTEMS:   Unable   SUBJECTIVE:  Appears comfortable  VITAL SIGNS: Temp:  [98.8 F (37.1 C)-98.9 F (37.2 C)] 98.8 F (37.1 C) (08/19 0754) Pulse Rate:  [63-100] 77 (08/19 1158) Resp:  [16-18] 17 (08/19 1158) BP: (105-124)/(58-71) 111/58 mmHg (08/19 1158) SpO2:  [96 %-100 %] 100 % (08/19 1158)  PHYSICAL EXAMINATION: General:  Chronically ill appearing male, resting comfortably in bed.  Neuro:  Resting comfortably. Non-verbal.  HEENT: MM dry, no JVD  Cardiovascular:  rrr Lungs:  Decreased on right/ scattered rhonchi  Abdomen:  Non-tender, + bowel sounds  Musculoskeletal:  Contracted/ weak    Recent Labs Lab 09/17/12 0708 09/18/12 0350 09/19/12 0500  NA 137 137 140  K 3.2* 3.3* 3.0*  CL 98 100 100  CO2 27 24 29   BUN 7 4* 3*  CREATININE 0.47* 0.43* 0.44*  GLUCOSE 98 66* 78    Recent Labs Lab 09/16/12 0142 09/18/12 0350 09/19/12 0500  HGB 14.0 12.1* 12.9*  HCT 42.6 36.5* 38.0*  WBC 10.4 5.5 4.4  PLT 135* 128* 138*   Dg Chest Port 1 View  09/18/2012    *RADIOLOGY REPORT*  Clinical Data: Central line placement.  PORTABLE CHEST - 1 VIEW  Comparison: Chest x-ray dated 09/15/2012 and CT scan of the abdomen and pelvis dated 09/15/2012  Findings:  Left-sided central line is in place and is probably in the right atrium.  The patient now has opacified the entire right lung with increased shift of the heart and mediastinal structures to the right.  Left lung is clear.  No pneumothorax.  NG tube tip is below the diaphragm.  IMPRESSION:  1.  There is now complete opacification of the right hemithorax, probably a combination of lung consolidation and atelectasis. 2.  Central line tip is most likely in the right atrium and could be retracted approximately 3 cm.   Original Report Authenticated By: Francene Boyers, M.D.    ASSESSMENT / PLAN:  Acute hypoxic respiratory failure in the setting of right sided atelectasis w/ complete opacification of right hemi-thorax. Likely aspiration after vomiting or seizure vs retained mucous obstructing RMS. His O2 requirements have improved so hope that this may be resolving.  Recommendation - repeat CXR. If improved would favor conservative approach with vibra vest/bronchodilators and PRN suction -if PCXR not improved would consider FOB  - risks & benefits of procedure were discussed with father at the bedside, he will further discuss with pt's mother who is a nurse & appears to be main decision maker. -Add unasyn to clinda for aspiration   Oretha Milch  Pulmonary and Critical Care Medicine [facility] Pager: [phone]  09/19/2012, 12:12 PM

## 2012-09-19 NOTE — Consult Note (Signed)
[facility] CON[facility] Reason for consult: question if PEG tube can be used. Referring Physician: Dr. Acey Lav is an 21 y.o. male.  HPI: patient has cerebral palsy and is noncommunicative. He has had a PEG tube for many years. He was recently changed because of obstruction by Dr. Dickie La. He has had recurrent aspirations. He is back in the hospital now with aspiration pneumonia. He is seen both other G.I. groups in town on this admission about his PEG tube. It apparently is flushing well. It is requested that another G.I. evaluation of the PEG to be performed. According to the nursing staff to PEG tube is flushing well it is being used for flushes as well as for medications.  Past Medical History  Diagnosis Date  . CP (cerebral palsy), spastic, quadriplegic   . Epilepsy   . Osteoporosis   . Inguinal hernia   . Undescended testes   . Seasonal allergies   . IVH (intraventricular hemorrhage)     Grade IV  . Hip dislocation, bilateral   . Pneumonia   . Seizures   . Hx: UTI (urinary tract infection)   . Dysphagia   . Otitis media   . Retinopathy of prematurity   . 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 Neuromuscular scoliosis   . Osteoporosis   . Complex partial seizures   . Generalized convulsive epilepsy without mention of intractable epilepsy   . Epilepsy   . Sinus bradycardia     HR drops to 38-40 while sleeping  . Sleep apnea     BiPAP  . Blister of right heel     fluid filled; origin unknown  . Kidney stones     ?    Past Surgical History  Procedure Laterality Date  . Excision of moles    . Peg placement      With multiple changes  . Gastrostomy tube, change / reposition  12/15/2010       . Button change  12/15/2010    Procedure: BUTTON CHANGE;  Surgeon: Iva Boop, MD;  Location: [facility];  Service: Endoscopy;  Laterality: N/A;  . Peg placement  10/07/2011    Procedure: PERCUTANEOUS ENDOSCOPIC GASTROSTOMY (PEG) REPLACEMENT;  Surgeon:  Hart Carwin, MD;  Location: [facility];  Service: Endoscopy;  Laterality: N/A;  Needs 18 F 2.5 button ordered-dl  . Inguinal hernia repair Bilateral 1992  . Retinopathy of prematurity surgery  1992  . Achilles tendon lengthening  12/1998  . Soft tissue releases  wrists and fingers  12/1998  . Intrathecal baclofen pump placement  07/25/2000  . Peg placement N/A 06/05/2012    Procedure: PERCUTANEOUS ENDOSCOPIC GASTROSTOMY (PEG) REPLACEMENT;  Surgeon: Hart Carwin, MD;  Location: [facility];  Service: Endoscopy;  Laterality: N/A;  button 38fr.2.5cm  . Eye surgery Bilateral     Retinal   . Back surgery      Harrington Rods in back needs to be log rolled  . Tendon repair Bilateral 09/05/2012    Procedure: LENGTHENING OF DIGITAL FLEXOR TENDONS BILTERAL HANDS;  Surgeon: Jodi Marble, MD;  Location: [facility];  Service: Orthopedics;  Laterality: Bilateral;  . Hand surgery Bilateral Aug. 2014  . Peg placement N/A 09/13/2012    Procedure: PERCUTANEOUS ENDOSCOPIC GASTROSTOMY (PEG) REPLACEMENT;  Surgeon: Hart Carwin, MD;  Location: [facility];  Service: Endoscopy;  Laterality: N/A;    Family History  Problem Relation Age of Onset  . Adopted: Yes    Social History:  reports that he has never smoked. He has never used smokeless tobacco. He reports that he does not drink alcohol or use illicit drugs.  Allergies:  Allergies  Allergen Reactions  . Adhesive [Tape]     Rips skin off  . Depakote [Divalproex Sodium]     Causes pancreatitis     Medications; . albuterol  2.5 mg Nebulization Q6H  . ampicillin-sulbactam (UNASYN) IV  3 g Intravenous Q6H  . calcium carbonate (dosed in mg elemental calcium)  1,000 mg of elemental calcium Oral BID WC  . clindamycin (CLEOCIN) IV  600 mg Intravenous Q8H  . free water  250 mL Per Tube Q8H  . gabapentin  800 mg Per Tube TID  . levETIRAcetam  2,000 mg Intravenous Q12H  . multivitamin  10 mL Oral Daily  . pantoprazole (PROTONIX) IV  40 mg Intravenous  Q12H  . potassium chloride  40 mEq Per Tube BID  . sodium chloride  3 mL Intravenous Q12H  . vitamin C  500 mg Oral QHS   PRN Meds sodium chloride, albuterol, sodium chloride Results for orders placed during the hospital encounter of 09/15/12 (from the past 48 hour(s))  BASIC METABOLIC PANEL     Status: Abnormal   Collection Time    09/18/12  3:50 AM      Result Value Range   Sodium 137  135 - 145 mEq/L   Potassium 3.3 (*) 3.5 - 5.1 mEq/L   Chloride 100  96 - 112 mEq/L   CO2 24  19 - 32 mEq/L   Glucose, Bld 66 (*) 70 - 99 mg/dL   BUN 4 (*) 6 - 23 mg/dL   Creatinine, Ser 0.45 (*) 0.50 - 1.35 mg/dL   Calcium 8.7  8.4 - 40.9 mg/dL   GFR calc non Af Amer >90  >90 mL/min   GFR calc Af Amer >90  >90 mL/min   Comment: (NOTE)     The eGFR has been calculated using the CKD EPI equation.     This calculation has not been validated in all clinical situations.     eGFR's persistently <90 mL/min signify possible Chronic Kidney     Disease.  CBC     Status: Abnormal   Collection Time    09/18/12  3:50 AM      Result Value Range   WBC 5.5  4.0 - 10.5 K/uL   RBC 3.74 (*) 4.22 - 5.81 MIL/uL   Hemoglobin 12.1 (*) 13.0 - 17.0 g/dL   HCT 81.1 (*) 91.4 - 78.2 %   MCV 97.6  78.0 - 100.0 fL   MCH 32.4  26.0 - 34.0 pg   MCHC 33.2  30.0 - 36.0 g/dL   RDW 95.6  21.3 - 08.6 %   Platelets 128 (*) 150 - 400 K/uL  GLUCOSE, CAPILLARY     Status: None   Collection Time    09/18/12  8:25 AM      Result Value Range   Glucose-Capillary 77  70 - 99 mg/dL   Comment 1 Notify RN    GLUCOSE, CAPILLARY     Status: None   Collection Time    09/18/12  9:53 AM      Result Value Range   Glucose-Capillary 70  70 - 99 mg/dL   Comment 1 Notify RN    GLUCOSE, CAPILLARY     Status: Abnormal   Collection Time    09/18/12 12:12 PM      Result Value Range  Glucose-Capillary 114 (*) 70 - 99 mg/dL  STOOL CULTURE     Status: None   Collection Time    09/18/12  2:03 PM      Result Value Range   Specimen  Description STOOL     Special Requests NONE     Culture       Value: Culture reincubated for better growth     Performed at [facility]   Report Status PENDING    GLUCOSE, CAPILLARY     Status: None   Collection Time    09/18/12  4:10 PM      Result Value Range   Glucose-Capillary 77  70 - 99 mg/dL  GLUCOSE, CAPILLARY     Status: None   Collection Time    09/18/12  8:16 PM      Result Value Range   Glucose-Capillary 92  70 - 99 mg/dL   Comment 1 Notify RN     Comment 2 Documented in Chart    CBC     Status: Abnormal   Collection Time    09/19/12  5:00 AM      Result Value Range   WBC 4.4  4.0 - 10.5 K/uL   RBC 3.93 (*) 4.22 - 5.81 MIL/uL   Hemoglobin 12.9 (*) 13.0 - 17.0 g/dL   HCT 40.9 (*) 81.1 - 91.4 %   MCV 96.7  78.0 - 100.0 fL   MCH 32.8  26.0 - 34.0 pg   MCHC 33.9  30.0 - 36.0 g/dL   RDW 78.2  95.6 - 21.3 %   Platelets 138 (*) 150 - 400 K/uL  COMPREHENSIVE METABOLIC PANEL     Status: Abnormal   Collection Time    09/19/12  5:00 AM      Result Value Range   Sodium 140  135 - 145 mEq/L   Potassium 3.0 (*) 3.5 - 5.1 mEq/L   Chloride 100  96 - 112 mEq/L   CO2 29  19 - 32 mEq/L   Glucose, Bld 78  70 - 99 mg/dL   BUN 3 (*) 6 - 23 mg/dL   Creatinine, Ser 0.86 (*) 0.50 - 1.35 mg/dL   Calcium 8.8  8.4 - 57.8 mg/dL   Total Protein 5.9 (*) 6.0 - 8.3 g/dL   Albumin 2.9 (*) 3.5 - 5.2 g/dL   AST 16  0 - 37 U/L   ALT 21  0 - 53 U/L   Alkaline Phosphatase 110  39 - 117 U/L   Total Bilirubin 0.6  0.3 - 1.2 mg/dL   GFR calc non Af Amer >90  >90 mL/min   GFR calc Af Amer >90  >90 mL/min   Comment: (NOTE)     The eGFR has been calculated using the CKD EPI equation.     This calculation has not been validated in all clinical situations.     eGFR's persistently <90 mL/min signify possible Chronic Kidney     Disease.    Dg Chest Port 1 View  09/19/2012   *RADIOLOGY REPORT*  Clinical Data: Right-sided atelectasis.  PORTABLE CHEST - 1 VIEW  Comparison: 09/18/2012.   Findings: Patient is rotated. Left IJ central line tip projects over the right atrium.  Nasogastric tube is followed into the stomach.  Heart size normal.  Mild bibasilar air space disease, markedly improved from complete opacification of the right hemithorax, seen on yesterday's exam.  Left lung is clear. Harrington rods span the visualized thoracolumbar spine.  IMPRESSION:  1.  Marked interval improvement in right lung aeration with mild residual right basilar airspace disease. 2.  Left IJ central line tip appears to be within the right atrium.   Original Report Authenticated By: Leanna Battles, M.D.   Dg Chest Port 1 View  09/18/2012   *RADIOLOGY REPORT*  Clinical Data: Central line placement.  PORTABLE CHEST - 1 VIEW  Comparison: Chest x-ray dated 09/15/2012 and CT scan of the abdomen and pelvis dated 09/15/2012  Findings:  Left-sided central line is in place and is probably in the right atrium.  The patient now has opacified the entire right lung with increased shift of the heart and mediastinal structures to the right.  Left lung is clear.  No pneumothorax.  NG tube tip is below the diaphragm.  IMPRESSION:  1.  There is now complete opacification of the right hemithorax, probably a combination of lung consolidation and atelectasis. 2.  Central line tip is most likely in the right atrium and could be retracted approximately 3 cm.   Original Report Authenticated By: Francene Boyers, M.D.               Blood pressure 117/63, pulse 88, temperature 98.6 F (37 C), temperature source Oral, resp. rate 17, height 4\' 11"  (1.499 m), weight 47 kg (103 lb 9.9 oz), SpO2 94.00%.  Physical exam:   General-- debilitated young man nonresponsive. Mother hovering at the bedside Heart-- Lungs-- Abdomen-- PEG tube moving well snuck against abdominal wall   Assessment: 1. PEG Tube see no problem with tube at this point in time and you'll be OK to go ahead and start feeding  Plan: would go ahead and  begin feeding through PEG tube. Please call back if there is any further problem.   Dorthey Depace JR,Mykel Sponaugle L 09/19/2012, 4:23 PM

## 2012-09-19 NOTE — Progress Notes (Signed)
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 Request was from family and relayed through nurse. Pt's mom was at bedside. She described misplacement of a feeding tube as crisis that precipitated her request for prayer. That crisis had been resolved but she still wanted prayer. Mom shared story of her and her husband taking pt as a foster child at age 22 months and caring for him all these years. Pt was awake with eyes open and smiled when his mom rubbed his scalp. I spoke with pt and took his hand as I prayed. Pt's mother expressed great appreciation for the visit.

## 2012-09-19 NOTE — Progress Notes (Signed)
Pts mother refusing to allow staff to turn pt stating that "he is on an air mattress."  Will monitor as allowed.  Tyhesha Dutson, [facility]

## 2012-09-19 NOTE — Progress Notes (Signed)
Internal Medicine Attending  Date: 09/19/2012  Patient name: Jeffery [name] Medical record number: [id number] Date of birth: December 28, 1990 Age: 22 y.o. Gender: male  I saw and evaluated the patient, and discussed his care on a.m. rounds with house staff; see the progress note by resident Dr. Yetta Barre for details of clinical findings and plans.  Patient is afebrile with stable vital signs and good oxygen saturations on 2 L nasal cannula oxygen.  His followup chest x-ray showed opacification of the right hemithorax, and pulmonary consultation was obtained.  GI consult has been requested; enteral feeds are currently on hold pending GI assessment.

## 2012-09-19 NOTE — Progress Notes (Signed)
In to see patient at this time, attempted to turn patient at this time. Mother refused turn and stated that her son was on an air mattress and does not need to be turned. Mother educated and informed of the benefits of turning and risks associated with not turning. Per mother request patient not turned. Will continue to monitor the patient closely.

## 2012-09-20 ENCOUNTER — Inpatient Hospital Stay (HOSPITAL_COMMUNITY): Payer: Medicaid Other

## 2012-09-20 ENCOUNTER — Ambulatory Visit: Payer: Medicaid Other | Admitting: Neurology

## 2012-09-20 LAB — CBC WITH DIFFERENTIAL/PLATELET
Basophils Absolute: 0 10*3/uL (ref 0.0–0.1)
Basophils Relative: 0 % (ref 0–1)
Eosinophils Absolute: 0.1 10*3/uL (ref 0.0–0.7)
MCH: 32.7 pg (ref 26.0–34.0)
MCHC: 33.3 g/dL (ref 30.0–36.0)
Monocytes Relative: 12 % (ref 3–12)
Neutro Abs: 2.8 10*3/uL (ref 1.7–7.7)
Neutrophils Relative %: 70 % (ref 43–77)
RDW: 12.9 % (ref 11.5–15.5)

## 2012-09-20 LAB — BASIC METABOLIC PANEL
BUN: <3 mg/dL — ABNORMAL LOW (ref 6–23)
Chloride: 104 mEq/L (ref 96–112)
Creatinine, Ser: 0.43 mg/dL — ABNORMAL LOW (ref 0.50–1.35)
GFR calc Af Amer: >90 mL/min (ref >90)
GFR calc non Af Amer: >90 mL/min (ref >90)
Potassium: 3.5 mEq/L (ref 3.5–5.1)

## 2012-09-20 LAB — GLUCOSE, CAPILLARY: Glucose-Capillary: 113 mg/dL — ABNORMAL HIGH (ref 70–99)

## 2012-09-20 MED ORDER — JEVITY 1.2 CAL PO LIQD
237.0000 mL | Freq: Four times a day (QID) | ORAL | Status: DC
  Administered 2012-09-20: 60 mL
  Administered 2012-09-20: 237 mL
  Administered 2012-09-20: 60 mL
  Administered 2012-09-21: 120 mL
  Administered 2012-09-21: 237 mL
  Administered 2012-09-21 (×2): 120 mL
  Administered 2012-09-22 (×3): 237 mL
  Filled 2012-09-20 (×16): qty 237

## 2012-09-20 MED ORDER — JEVITY 1.2 CAL PO LIQD
237.0000 mL | Freq: Four times a day (QID) | ORAL | Status: DC
  Filled 2012-09-20 (×6): qty 237

## 2012-09-20 MED ORDER — PRO-STAT SUGAR FREE PO LIQD
30.0000 mL | Freq: Two times a day (BID) | ORAL | Status: DC
  Administered 2012-09-20 – 2012-09-22 (×5): 30 mL
  Filled 2012-09-20 (×6): qty 30

## 2012-09-20 MED ORDER — JEVITY 1.2 CAL PO LIQD
1000.0000 mL | ORAL | Status: DC
  Administered 2012-09-20: 1000 mL

## 2012-09-20 MED ORDER — JEVITY 1.2 CAL PO LIQD: 237.0000 mL | Freq: Four times a day (QID) | ORAL | Status: DC

## 2012-09-20 NOTE — Progress Notes (Signed)
Subjective: [name] is a 22 y.o. male w/ PMHx of Cerebral Palsy (spastic), epilepsy, and osteoporosis, presents to the ED w/ legal guardian w/ complaints of dark emesis, low grade fever, and lethargy for 2 days prior to admission. The patient has had a PEG tube since 2000 and recently had it replaced on 09/13/12 at [facility]. Since that time, it is reported that he has had vomiting w/ almost every tube feed (15-20 times in the last 2 days), resulting in a dark brown/maroon colored emesis. Patient was shown to have RLL consolidation on CT, leukocytosis on admission, low grade fever, and history of aspiration pneumonia in the past.   Patient seen at bedside this AM. Still seems like he's improving. Much more active and alert. Advanced diet today via PEG as per nutritionist recommendations. Started Jevity 1.2 formula: 1 can QID with Prostat liquid protein 30 ml BID. CXR improved since 09/18/12. Still satting in the mid-90's. No vomiting since admission. No recent seizures.   Objective: Vital signs in last 24 hours: Filed Vitals:   09/20/12 1155 09/20/12 1200 09/20/12 1338 09/20/12 1400  BP: 127/75 114/59  122/61  Pulse: 89 83  80  Temp: 99 F (37.2 C)     TempSrc: Axillary     Resp: 24 21  17   Height:      Weight:      SpO2: 96% 97% 99% 92%   Weight change:   Intake/Output Summary (Last 24 hours) at 09/20/12 1613 Last data filed at 09/20/12 1200  Gross per 24 hour  Intake 3096.67 ml  Output      0 ml  Net 3096.67 ml   Physical Exam: General: More alert, in no apparent distress. Patient has Cerebral Palsy, is nonverbal and immobile. HEENT: Oropharynx clear and non-erythematous  Lungs: Decreased breath sounds on right side, with some harsh breath sounds heard. Right lung clear to auscultation. No wheeze. On 2L [place] and SpO2 @ 95%. Heart: Regular rate and rhythm, no murmurs, gallops, or rubs Abdomen: Soft, non-tender, non-distended, normal bowel sounds. PEG in place. Site looks clean  w/out leak. Extremities: No cyanosis, clubbing, or edema. Patient w/ structural abnormalities involving the extremities. Both LE w/ contractures, UE bandages removed, tendon release incisions appear clean, dry and intact bilaterally. Neurologic: Patient w/ cerebral palsy. Patient smiling this AM. More responsive to verbal stimuli. Unable to properly assess neurological function.  Lab Results: Basic Metabolic Panel:  Recent Labs Lab 09/16/12 0142 09/17/12 0708  09/19/12 0500 09/20/12 0500  NA 137 137  < > 140 143  K 3.3* 3.2*  < > 3.0* 3.5  CL 98 98  < > 100 104  CO2 26 27  < > 29 29  GLUCOSE 89 98  < > 78 107*  BUN 16 7  < > 3* <3*  CREATININE 0.59 0.47*  < > 0.44* 0.43*  CALCIUM 8.8 9.0  < > 8.8 8.9  MG  --  1.8  --   --   --   < > = values in this interval not displayed. Liver Function Tests:  Recent Labs Lab 09/17/12 0708 09/19/12 0500  AST 22 16  ALT 38 21  ALKPHOS 122* 110  BILITOT 1.5* 0.6  PROT 6.1 5.9*  ALBUMIN 3.1* 2.9*    Recent Labs Lab 09/15/12 0939  LIPASE 31   No results found for this basename: AMMONIA,  in the last 168 hours CBC:  Recent Labs Lab 09/16/12 0142  09/19/12 0500 09/20/12  0500  WBC 10.4  < > 4.4 4.0  NEUTROABS 8.5*  --   --  2.8  HGB 14.0  < > 12.9* 12.7*  HCT 42.6  < > 38.0* 38.1*  MCV 99.8  < > 96.7 98.2  PLT 135*  < > 138* 144*  < > = values in this interval not displayed. Coagulation:  Recent Labs Lab 09/16/12 0142  LABPROT 14.8  INR 1.19   Urinalysis:  Recent Labs Lab 09/15/12 1300  COLORURINE AMBER*  LABSPEC 1.036*  PHURINE 5.5  GLUCOSEU NEGATIVE  HGBUR NEGATIVE  BILIRUBINUR SMALL*  KETONESUR 15*  PROTEINUR 100*  UROBILINOGEN 1.0  NITRITE NEGATIVE  LEUKOCYTESUR TRACE*    Micro Results: Recent Results (from the past 240 hour(s))  CLOSTRIDIUM DIFFICILE BY PCR     Status: None   Collection Time    09/14/12 10:45 AM      Result Value Range Status   C difficile by pcr Not Detected  Not Detected Final    Comment:       This assay detects the presence of Clostridium difficile DNA coding     for toxin B (tcdB) by real-time polymerase chain reaction (PCR)     amplification.     This test was developed and its performance characteristics have been     determined by [facility]. Performance characteristics refer     to the analytical performance of the test. This test has not been     cleared or approved by the [place] Food and Drug Administration. The FDA     has determined that such clearance or approval is not necessary. This     laboratory is certified under the Clinical Laboratory Improvement     Amendments of 1988 as qualified to perform high complexity clinical     laboratory testing.  CULTURE, BLOOD (SINGLE)     Status: None   Collection Time    09/15/12 11:30 AM      Result Value Range Status   Specimen Description BLOOD LEFT ARM   Final   Special Requests BOTTLES DRAWN AEROBIC ONLY 10CC   Final   Culture  Setup Time     Final   Value: 09/16/2012 01:03     Performed at [facility]   Culture     Final   Value:        BLOOD CULTURE RECEIVED NO GROWTH TO DATE CULTURE WILL BE HELD FOR 5 DAYS BEFORE ISSUING A FINAL NEGATIVE REPORT     Performed at [facility]   Report Status PENDING   Incomplete  MRSA PCR SCREENING     Status: None   Collection Time    09/16/12  2:44 AM      Result Value Range Status   MRSA by PCR NEGATIVE  NEGATIVE Final   Comment:            The GeneXpert MRSA Assay (FDA     approved for NASAL specimens     only), is one component of a     comprehensive MRSA colonization     surveillance program. It is not     intended to diagnose MRSA     infection nor to guide or     monitor treatment for     MRSA infections.  STOOL CULTURE     Status: None   Collection Time    09/18/12  2:03 PM      Result Value Range Status   Specimen Description  STOOL   Final   Special Requests NONE   Final   Culture     Final   Value: NO SUSPICIOUS  COLONIES, CONTINUING TO HOLD     Performed at [facility]   Report Status PENDING   Incomplete   Studies/Results: Dg Chest Port 1 View  09/20/2012   *RADIOLOGY REPORT*  Clinical Data: Aspiration pneumonia  PORTABLE CHEST - 1 VIEW  Comparison: 09/19/2012  Findings: Worsening infiltrate in the right upper lobe and right lower lobe.  Left lung remains clear.  Central venous catheter tip in the right atrium unchanged.  NG tube has been removed  IMPRESSION: Worsening infiltrate throughout the right lung, probable pneumonia.   Original Report Authenticated By: Janeece Riggers, M.D.   Dg Chest Port 1 View  09/19/2012   *RADIOLOGY REPORT*  Clinical Data: Right-sided atelectasis.  PORTABLE CHEST - 1 VIEW  Comparison: 09/18/2012.  Findings: Patient is rotated. Left IJ central line tip projects over the right atrium.  Nasogastric tube is followed into the stomach.  Heart size normal.  Mild bibasilar air space disease, markedly improved from complete opacification of the right hemithorax, seen on yesterday's exam.  Left lung is clear. Harrington rods span the visualized thoracolumbar spine.  IMPRESSION:  1.  Marked interval improvement in right lung aeration with mild residual right basilar airspace disease. 2.  Left IJ central line tip appears to be within the right atrium.   Original Report Authenticated By: Leanna Battles, M.D.   Dg Chest Port 1 View  09/18/2012   *RADIOLOGY REPORT*  Clinical Data: Central line placement.  PORTABLE CHEST - 1 VIEW  Comparison: Chest x-ray dated 09/15/2012 and CT scan of the abdomen and pelvis dated 09/15/2012  Findings:  Left-sided central line is in place and is probably in the right atrium.  The patient now has opacified the entire right lung with increased shift of the heart and mediastinal structures to the right.  Left lung is clear.  No pneumothorax.  NG tube tip is below the diaphragm.  IMPRESSION:  1.  There is now complete opacification of the right hemithorax,  probably a combination of lung consolidation and atelectasis. 2.  Central line tip is most likely in the right atrium and could be retracted approximately 3 cm.   Original Report Authenticated By: Francene Boyers, M.D.   Medications: I have reviewed the patient's current medications. Scheduled Meds: . albuterol  2.5 mg Nebulization Q6H  . ampicillin-sulbactam (UNASYN) IV  3 g Intravenous Q6H  . calcium carbonate (dosed in mg elemental calcium)  1,000 mg of elemental calcium Oral BID WC  . clindamycin (CLEOCIN) IV  600 mg Intravenous Q8H  . feeding supplement (JEVITY 1.2 CAL)  237 mL Per Tube QID  . feeding supplement  30 mL Per Tube BID  . free water  250 mL Per Tube Q8H  . gabapentin  800 mg Per Tube TID  . levETIRAcetam  2,000 mg Intravenous Q12H  . multivitamin  10 mL Oral Daily  . pantoprazole (PROTONIX) IV  40 mg Intravenous Q12H  . potassium chloride  40 mEq Per Tube BID  . sodium chloride  3 mL Intravenous Q12H  . vitamin C  500 mg Oral QHS   Continuous Infusions: . dextrose 5 % and 0.9% NaCl 100 mL/hr at 09/20/12 0700   PRN Meds:.sodium chloride, albuterol, sodium chloride  Assessment/Plan: 22 y.o. male w/ PMHx of Cerebral Palsy (spastic), epilepsy, and osteoporosis, presents to the ED w/ legal guardian w/  complaints of dark emesis, low grade fever, and lethargy for 2 days prior to admission.  #Dark brown emesis w/ elevated LFT's- Patient w/ vomiting at home, s/p PEG replacement on 09/13/12. Emesis appeared dark brown and slightly maroon and occurs after every tube feed. Most likely 2/2 gastric mucosal irritation d/t new PEG placement. CT abdomen showed no signs of cholecystitis or gastric inflammation. PEG shown to be in proper position. No vomiting reported overnight. -Started PEG feeds today as per nutritionist recommendations. Started Jevity 1.2 formula: 1 can QID with Prostat liquid protein 30 ml BID. -Continue Protonix 40 mg IV bid  -Given Zofran 4 mg prn  -Still on D5NS @  100 ml/hr. Will change fluids in AM if patient is tolerating tube feeds.   #Possible aspiration pneumonia- Patient has a known history of aspiration pneumonia in the past. Repeat CXR on 09/18/12 showed significant worsening of right lung, w/ complete opacification of RL. Repeat CXR yesterday looked significantly improved, but looked slightly worsened this AM.  -Continue Clindamycin 600 mg IV q8h. Consulted pulmonary yesterday, said CXR findings may be d/t possible mucus plug causing decreased respiration of R lung. Started on vibra vest. Also instructed to start Unasyn. Giving 3g IV q6h.  Pulm saw patient this AM. Do not feel that bronchoscopy is needed at this time. -Repeat CXR in AM.  #Epilepsy- Patient has a known history of seizure disorder. The guardian claims that his seizures are well controlled on Keppra 1,750 mg by PEG Tube bid. He only has about 4 seizures per year. No reported seizures since Keppra dose was adjusted. -Continue Keppra 2000 mg IVPB bid for now. Patient's neurologist left a note saying that he does not need increased dose of Keppra. Will continue 2000 bid for now to keep patient seizure-free during admission. Restart home dose on discharge. 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 He has contractures in all extremities and does respond visually to pain. 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 He is usually awake and alert, but he presents to the ED much more lethargic than his baseline. He receives Botox injections infrequently for spasticity, baclofen intrathecally, and neurontin.  -Stable at this time. Nurse to move patient to a chair today.  Dispo: Disposition is deferred at this time, awaiting improvement of current medical problems.  Anticipated discharge in approximately 2-3 day(s).   The patient does have a current PCP Mia Creek, MD)  and does need an [facility] hospital follow-up appointment after discharge.  The patient does not have transportation limitations that hinder transportation to clinic appointments.  .Services Needed at time of discharge: Y = Yes, Blank = No PT:   OT:   RN:   Equipment:   Other:     LOS: 5 days   Lars Masson, MD 09/20/2012, 4:13 PM Pager: [phone]

## 2012-09-20 NOTE — Progress Notes (Signed)
Pt is still on his home BIPAP machine on 12/7 with 2 lpm o2 bleed in with o2 sats 96%, in no distress, resting comfortably and sleeping well. RT will continue to assist as needed.

## 2012-09-20 NOTE — Progress Notes (Signed)
Pt witnessed coughing by mother and RN at 18:30. Color turned dusky blue and sats fell to 88% - RN increased O2 from 2L [place] to 6L [place] and used 100% non-rebreather for blow-by oxygen with witnessed increase in SpO2 levels to 100% and "pink-ing up" around oral mucosa while pt continued to cough. Suspected mucous plug because pt was unable to clear airway despite coughing and encouragement. RT called, e-link notified and verbal order to NT suction PRN given by Dr. Delford Field. Pt tolerated semi-well, HR increased to 120s. Bloody mucous sucked from lung by RT and sent to lab for resp. Culture. No vomiting or gagging occurred during episode.   Pt left on 6L [place] with humidity present bc nasal passageways noted to be dry. Pt pink, HR returned to NSR in 90s, RR down from 30s to 18. Repeat chest xray showed clearer right lung base than this morning's. Pt monitored, report and care given to Merlyn Albert, Charity fundraiser. Mother at bedside with suction present.    Delynn Flavin, RN

## 2012-09-20 NOTE — Progress Notes (Signed)
Internal Medicine Attending  Date: 09/20/2012  Patient name: Jeffery [name] Medical record number: [id number] Date of birth: 24-Sep-1990 Age: 22 y.o. Gender: male  I saw and evaluated the patient and discussed his care with resident Dr. Yetta Barre.  I agree with plans to continue empiric antibiotics for aspiration pneumonia; continue conservative management of right sided atelectasis as per pulmonary consult, who is following closely in case patient does need bronchoscopy; start tube feeds at low rate and advance as tolerated with attention to aspiration precautions.

## 2012-09-20 NOTE — Progress Notes (Signed)
NUTRITION CONSULT/FOLLOW UP  Intervention:    Initiate Jevity 1.2 formula: 1 can QID with Prostat liquid protein 30 ml BID via tube to provide 1340 kcals, 83 gm protein, 764 ml of free water  Continue free water flushes at 250 ml every 8 hours RD to follow for nutrition care plan  Nutrition Dx:   Inadequate oral intake related to inability to eat as evidenced by home EN support, ongoing  Goal:   EN to meet > 90% of estimated nutrition needs, progressing  Monitor:   EN regimen & tolerance, weight, labs, I/O's  Assessment:   Patient with h/o CP, epilepsy who presented with emesis dark in color, low grade fever and lethargy; symptoms started in relation to changing out of his PEG tube which was replaced on 8/13; CT scan likely aspiration pneumonia.  RD consulted for EN initiation & management.  RD spoke with Dr. Yetta Barre via telephone.  GI evaluation completed 8/19.  Plan is to re-initiate patient's TF via PEG tube today (advancing slowly).  Notified legal guardian [place] [employer]) and Charity fundraiser.  Height: Ht Readings from Last 1 Encounters:  09/15/12 4\' 11"  (1.499 m)    Weight Status:   Wt Readings from Last 1 Encounters:  09/20/12 109 lb 9.1 oz (49.7 kg)    Re-estimated needs:  Kcal: 1300-1500 Protein: 75-85 gm Fluid: >/= 1.5 L  Skin: Stage I pressure ulcer to heel  Diet Order: NPO   Intake/Output Summary (Last 24 hours) at 09/20/12 0910 Last data filed at 09/20/12 0700  Gross per 24 hour  Intake 2016.67 ml  Output      0 ml  Net 2016.67 ml    Labs:   Recent Labs Lab 09/16/12 0142 09/17/12 0708 09/18/12 0350 09/19/12 0500 09/20/12 0500  NA 137 137 137 140 143  K 3.3* 3.2* 3.3* 3.0* 3.5  CL 98 98 100 100 104  CO2 26 27 24 29 29   BUN 16 7 4* 3* <3*  CREATININE 0.59 0.47* 0.43* 0.44* 0.43*  CALCIUM 8.8 9.0 8.7 8.8 8.9  MG  --  1.8  --   --   --   GLUCOSE 89 98 66* 78 107*    CBG (last 3)   Recent Labs  09/18/12 1212 09/18/12 1610 09/18/12 2016  GLUCAP 114* 77  92    Scheduled Meds: . albuterol  2.5 mg Nebulization Q6H  . ampicillin-sulbactam (UNASYN) IV  3 g Intravenous Q6H  . calcium carbonate (dosed in mg elemental calcium)  1,000 mg of elemental calcium Oral BID WC  . clindamycin (CLEOCIN) IV  600 mg Intravenous Q8H  . free water  250 mL Per Tube Q8H  . gabapentin  800 mg Per Tube TID  . levETIRAcetam  2,000 mg Intravenous Q12H  . multivitamin  10 mL Oral Daily  . pantoprazole (PROTONIX) IV  40 mg Intravenous Q12H  . potassium chloride  40 mEq Per Tube BID  . sodium chloride  3 mL Intravenous Q12H  . vitamin C  500 mg Oral QHS    Continuous Infusions: . dextrose 5 % and 0.9% NaCl 100 mL/hr at 09/20/12 0700    Maureen Chatters, RD, LDN Pager #: [phone] After-Hours Pager #: [phone]

## 2012-09-20 NOTE — Progress Notes (Signed)
**Note De-Identified [place] Obfuscation** RT note: Patient NTS for mucus plugging.  Small amount removed and sent to lab for culture. Patient tolerated well. RT to cont. To monitor.

## 2012-09-20 NOTE — Progress Notes (Signed)
PULMONARY  / CRITICAL CARE MEDICINE  Name: Jeffery [name] MRN: [id number] DOB: 04-24-1990    ADMISSION DATE:  09/15/2012 CONSULTATION DATE:  8/19  REFERRING MD :  Drue Second   PRIMARY SERVICE:  Family medicine teaching service   CHIEF COMPLAINT:  Hypoxia   BRIEF PATIENT DESCRIPTION:  22 y.o. male w/ PMHx of Cerebral Palsy (spastic), epilepsy, and osteoporosis, presented to the ED on 8/15,  w/ legal guardian w/ report of dark emesis, low grade fever, and lethargy for 2 days prior to admission. This spontaneously resolved..Course complicated by break-thru seizure on 8/15, and progressive hypoxia. F/u CXR showed marked atelectasis w/ complete opacification of right hemithorax. PCCM asked to see for atx and hypoxia.   He has digital spasticity and flexion contractures requiring intrathecal baclofen pump.([place])  SIGNIFICANT EVENTS / STUDIES:    LINES / TUBES: Left IJ CVL 8/18  CULTURES:   ANTIBIOTICS: clinda 8/15>>>  SUBJECTIVE: Much more stable from a respiratory standpoint.  VITAL SIGNS: Temp:  [97.2 F (36.2 C)-99 F (37.2 C)] 99 F (37.2 C) (08/20 1155) Pulse Rate:  [57-89] 83 (08/20 1200) Resp:  [14-24] 21 (08/20 1200) BP: (100-127)/(49-86) 114/59 mmHg (08/20 1200) SpO2:  [92 %-99 %] 97 % (08/20 1200) Weight:  [49.7 kg (109 lb 9.1 oz)] 49.7 kg (109 lb 9.1 oz) (08/20 0414)  PHYSICAL EXAMINATION: General:  Chronically ill appearing male, resting comfortably in bed.  Neuro:  Resting comfortably. Non-verbal.  HEENT: MM dry, no JVD  Cardiovascular:  rrr Lungs:  Decreased on right/ scattered rhonchi  Abdomen:  Non-tender, + bowel sounds  Musculoskeletal:  Contracted/ weak   Recent Labs Lab 09/18/12 0350 09/19/12 0500 09/20/12 0500  NA 137 140 143  K 3.3* 3.0* 3.5  CL 100 100 104  CO2 24 29 29   BUN 4* 3* <3*  CREATININE 0.43* 0.44* 0.43*  GLUCOSE 66* 78 107*   Recent Labs Lab 09/18/12 0350 09/19/12 0500 09/20/12 0500  HGB 12.1* 12.9* 12.7*  HCT 36.5*  38.0* 38.1*  WBC 5.5 4.4 4.0  PLT 128* 138* 144*   Dg Chest Port 1 View  09/20/2012   *RADIOLOGY REPORT*  Clinical Data: Aspiration pneumonia  PORTABLE CHEST - 1 VIEW  Comparison: 09/19/2012  Findings: Worsening infiltrate in the right upper lobe and right lower lobe.  Left lung remains clear.  Central venous catheter tip in the right atrium unchanged.  NG tube has been removed  IMPRESSION: Worsening infiltrate throughout the right lung, probable pneumonia.   Original Report Authenticated By: Janeece Riggers, M.D.   Dg Chest Port 1 View  09/19/2012   *RADIOLOGY REPORT*  Clinical Data: Right-sided atelectasis.  PORTABLE CHEST - 1 VIEW  Comparison: 09/18/2012.  Findings: Patient is rotated. Left IJ central line tip projects over the right atrium.  Nasogastric tube is followed into the stomach.  Heart size normal.  Mild bibasilar air space disease, markedly improved from complete opacification of the right hemithorax, seen on yesterday's exam.  Left lung is clear. Harrington rods span the visualized thoracolumbar spine.  IMPRESSION:  1.  Marked interval improvement in right lung aeration with mild residual right basilar airspace disease. 2.  Left IJ central line tip appears to be within the right atrium.   Original Report Authenticated By: Leanna Battles, M.D.   Dg Chest Port 1 View  09/18/2012   *RADIOLOGY REPORT*  Clinical Data: Central line placement.  PORTABLE CHEST - 1 VIEW  Comparison: Chest x-ray dated 09/15/2012 and CT scan of the abdomen and pelvis  dated 09/15/2012  Findings:  Left-sided central line is in place and is probably in the right atrium.  The patient now has opacified the entire right lung with increased shift of the heart and mediastinal structures to the right.  Left lung is clear.  No pneumothorax.  NG tube tip is below the diaphragm.  IMPRESSION:  1.  There is now complete opacification of the right hemithorax, probably a combination of lung consolidation and atelectasis. 2.  Central line  tip is most likely in the right atrium and could be retracted approximately 3 cm.   Original Report Authenticated By: Francene Boyers, M.D.    ASSESSMENT / PLAN:  Acute hypoxic respiratory failure in the setting of right sided atelectasis w/ complete opacification of right hemi-thorax. Likely aspiration after vomiting or seizure vs retained mucous obstructing RMS.  CXR revealed worsening infiltrate.     Recommendation - Continue conservative approach with vibra vest/bronchodilators and PRN suction, no evidence of atelectasis to confirm need for bronchoscopy. - Continue unasyn and clinda for aspiration. - CXR in AM.  Alyson Reedy, M.D. [facility] Pulmonary/Critical Care Medicine. Pager: [phone]. After hours pager: [phone].

## 2012-09-21 ENCOUNTER — Inpatient Hospital Stay (HOSPITAL_COMMUNITY): Payer: Medicaid Other

## 2012-09-21 LAB — BASIC METABOLIC PANEL
Calcium: 9.4 mg/dL (ref 8.4–10.5)
Chloride: 114 mEq/L — ABNORMAL HIGH (ref 96–112)
Creatinine, Ser: 0.35 mg/dL — ABNORMAL LOW (ref 0.50–1.35)
GFR calc Af Amer: >90 mL/min (ref >90)
GFR calc Af Amer: >90 mL/min (ref >90)
GFR calc non Af Amer: >90 mL/min (ref >90)
Glucose, Bld: 102 mg/dL — ABNORMAL HIGH (ref 70–99)
Potassium: 2.8 mEq/L — ABNORMAL LOW (ref 3.5–5.1)
Potassium: 4.4 mEq/L (ref 3.5–5.1)
Sodium: 141 mEq/L (ref 135–145)

## 2012-09-21 LAB — CBC
MCV: 99 fL (ref 78.0–100.0)
Platelets: 139 10*3/uL — ABNORMAL LOW (ref 150–400)
RDW: 13.3 % (ref 11.5–15.5)
WBC: 4.1 10*3/uL (ref 4.0–10.5)

## 2012-09-21 LAB — GLUCOSE, CAPILLARY
Glucose-Capillary: 64 mg/dL — ABNORMAL LOW (ref 70–99)
Glucose-Capillary: 105 mg/dL — ABNORMAL HIGH (ref 70–99)

## 2012-09-21 MED ORDER — POTASSIUM CHLORIDE 20 MEQ/15ML (10%) PO LIQD: 40.0000 meq | Freq: Two times a day (BID) | ORAL | Status: DC

## 2012-09-21 MED ORDER — POTASSIUM CHLORIDE 10 MEQ/100ML IV SOLN
10.0000 meq | INTRAVENOUS | Status: AC
  Administered 2012-09-21 (×4): 10 meq via INTRAVENOUS
  Filled 2012-09-21: qty 400

## 2012-09-21 MED ORDER — POTASSIUM CHLORIDE 20 MEQ/15ML (10%) PO LIQD
40.0000 meq | Freq: Once | ORAL | Status: AC
  Administered 2012-09-21: 40 meq via ORAL

## 2012-09-21 MED ORDER — MAGNESIUM SULFATE IN D5W 10-5 MG/ML-% IV SOLN
1.0000 g | Freq: Once | INTRAVENOUS | Status: AC
  Administered 2012-09-21: 1 g via INTRAVENOUS
  Filled 2012-09-21: qty 100

## 2012-09-21 NOTE — Progress Notes (Signed)
Subjective: Jeffery [name] is a 22 y.o. male w/ PMHx of Cerebral Palsy (spastic), epilepsy, and osteoporosis, presents to the ED w/ legal guardian w/ complaints of dark emesis, low grade fever, and lethargy for 2 days prior to admission. The patient has had a PEG tube since 2000 and recently had it replaced on 09/13/12 at [facility]. Since that time, it is reported that he has had vomiting w/ almost every tube feed (15-20 times in the last 2 days), resulting in a dark brown/maroon colored emesis. Patient was shown to have RLL consolidation on CT, leukocytosis on admission, low grade fever, and history of aspiration pneumonia in the past.   Patient seen at bedside this AM. Seems improved today, more alert, sitting in chair this morning. Satting in the mid-90's today. No fever. No seizures since admission.  Chest XR for last night looked very clear. However, overnight, patient had an episode of desatting, into the high 80's. He was put on 100% non-rebreather. RT called and suction was used to remove some bloody mucus from his airway. CXR was repeated this AM, showed worsening picture than last night's CXR.   Objective: Vital signs in last 24 hours: Filed Vitals:   09/21/12 0910 09/21/12 1144 09/21/12 1210 09/21/12 1506  BP:  109/70  97/58  Pulse:  90 94 93  Temp:  98.9 F (37.2 C)  97.9 F (36.6 C)  TempSrc:  Axillary  Axillary  Resp:  14 19 19   Height:      Weight:      SpO2: 92% 98% 90% 94%   Weight change: -7.1 oz (-0.2 kg)  Intake/Output Summary (Last 24 hours) at 09/21/12 1745 Last data filed at 09/21/12 1610  Gross per 24 hour  Intake   4320 ml  Output      0 ml  Net   4320 ml   Physical Exam: General: More alert, in no apparent distress. Patient has Cerebral Palsy, is nonverbal and immobile. HEENT: Oropharynx clear and non-erythematous  Lungs: Decreased breath sounds on right side, with some harsh breath sounds heard. Right lung clear to auscultation. No wheeze. On 2L [place] and SpO2  @ 95%. Heart: Regular rate and rhythm, no murmurs, gallops, or rubs Abdomen: Soft, non-tender, non-distended, normal bowel sounds. PEG in place. Site looks clean w/out leak. Extremities: No cyanosis, clubbing, or edema. Patient w/ structural abnormalities involving the extremities. Both LE w/ contractures, UE bandages removed, tendon release incisions appear clean, dry and intact bilaterally. Neurologic: Patient w/ cerebral palsy. Patient smiling this AM. More responsive to verbal stimuli. Unable to properly assess neurological function.  Lab Results: Basic Metabolic Panel:  Recent Labs Lab 09/17/12 0708  09/20/12 0500 09/21/12 0540 09/21/12 0920  NA 137  < > 143 145  --   K 3.2*  < > 3.5 2.8*  --   CL 98  < > 104 114*  --   CO2 27  < > 29 24  --   GLUCOSE 98  < > 107* 108*  --   BUN 7  < > <3* 3*  --   CREATININE 0.47*  < > 0.43* 0.35*  --   CALCIUM 9.0  < > 8.9 7.1*  --   MG 1.8  --   --   --  1.7  < > = values in this interval not displayed. Liver Function Tests:  Recent Labs Lab 09/17/12 0708 09/19/12 0500  AST 22 16  ALT 38 21  ALKPHOS 122* 110  BILITOT 1.5* 0.6  PROT 6.1 5.9*  ALBUMIN 3.1* 2.9*    Recent Labs Lab 09/15/12 0939  LIPASE 31   No results found for this basename: AMMONIA,  in the last 168 hours CBC:  Recent Labs Lab 09/16/12 0142  09/20/12 0500 09/21/12 0540  WBC 10.4  < > 4.0 4.1  NEUTROABS 8.5*  --  2.8  --   HGB 14.0  < > 12.7* 12.6*  HCT 42.6  < > 38.1* 38.4*  MCV 99.8  < > 98.2 99.0  PLT 135*  < > 144* 139*  < > = values in this interval not displayed. Coagulation:  Recent Labs Lab 09/16/12 0142  LABPROT 14.8  INR 1.19   Urinalysis:  Recent Labs Lab 09/15/12 1300  COLORURINE AMBER*  LABSPEC 1.036*  PHURINE 5.5  GLUCOSEU NEGATIVE  HGBUR NEGATIVE  BILIRUBINUR SMALL*  KETONESUR 15*  PROTEINUR 100*  UROBILINOGEN 1.0  NITRITE NEGATIVE  LEUKOCYTESUR TRACE*    Micro Results: Recent Results (from the past 240  hour(s))  CLOSTRIDIUM DIFFICILE BY PCR     Status: None   Collection Time    09/14/12 10:45 AM      Result Value Range Status   C difficile by pcr Not Detected  Not Detected Final   Comment:       This assay detects the presence of Clostridium difficile DNA coding     for toxin B (tcdB) by real-time polymerase chain reaction (PCR)     amplification.     This test was developed and its performance characteristics have been     determined by [facility]. Performance characteristics refer     to the analytical performance of the test. This test has not been     cleared or approved by the [place] Food and Drug Administration. The FDA     has determined that such clearance or approval is not necessary. This     laboratory is certified under the Clinical Laboratory Improvement     Amendments of 1988 as qualified to perform high complexity clinical     laboratory testing.  CULTURE, BLOOD (SINGLE)     Status: None   Collection Time    09/15/12 11:30 AM      Result Value Range Status   Specimen Description BLOOD LEFT ARM   Final   Special Requests BOTTLES DRAWN AEROBIC ONLY 10CC   Final   Culture  Setup Time     Final   Value: 09/16/2012 01:03     Performed at [facility]   Culture     Final   Value:        BLOOD CULTURE RECEIVED NO GROWTH TO DATE CULTURE WILL BE HELD FOR 5 DAYS BEFORE ISSUING A FINAL NEGATIVE REPORT     Performed at [facility]   Report Status PENDING   Incomplete  MRSA PCR SCREENING     Status: None   Collection Time    09/16/12  2:44 AM      Result Value Range Status   MRSA by PCR NEGATIVE  NEGATIVE Final   Comment:            The GeneXpert MRSA Assay (FDA     approved for NASAL specimens     only), is one component of a     comprehensive MRSA colonization     surveillance program. It is not     intended to diagnose MRSA     infection nor to  guide or     monitor treatment for     MRSA infections.  STOOL CULTURE     Status: None    Collection Time    09/18/12  2:03 PM      Result Value Range Status   Specimen Description STOOL   Final   Special Requests NONE   Final   Culture     Final   Value: NO SUSPICIOUS COLONIES, CONTINUING TO HOLD     Performed at [facility]   Report Status PENDING   Incomplete  CULTURE, RESPIRATORY (NON-EXPECTORATED)     Status: None   Collection Time    09/20/12  7:05 PM      Result Value Range Status   Specimen Description TRACHEAL ASPIRATE   Final   Special Requests NONE   Final   Gram Stain     Final   Value: RARE WBC PRESENT,BOTH PMN AND MONONUCLEAR     RARE SQUAMOUS EPITHELIAL CELLS PRESENT     NO ORGANISMS SEEN     Performed at [facility]   Culture PENDING   Incomplete   Report Status PENDING   Incomplete   Studies/Results: Dg Chest Port 1 View  09/21/2012   *RADIOLOGY REPORT*  Clinical Data: Short of breath.  Pneumonia  PORTABLE CHEST - 1 VIEW  Comparison: 09/20/2012  Findings: Recurrent right lower lobe and right upper lobe atelectasis with increased density and volume loss on the right. This may be due to mucous plugging.  Underlying pneumonia not excluded.  Left lung remains clear.  Central venous catheter tip remains in the right atrium.  IMPRESSION: Recurrent right upper lobe and right lower lobe atelectasis and volume loss.  Probable mucous plugging.   Original Report Authenticated By: Janeece Riggers, M.D.   Dg Chest Port 1 View  09/20/2012   *RADIOLOGY REPORT*  Clinical Data: Dyspnea.  PORTABLE CHEST - 1 VIEW  Comparison: 09/20/2012 at 5:37 a.m.  Findings: Opacities seen at the right lung base on the recent prior to chest radiographs has essentially resolved.  This rapid resolution would suggest that it was either asymmetric edema or atelectasis.  The lungs now appear clear.  The left central venous line has its tip projecting the right atrium, stable.  The cardiac silhouette is normal in size.  There is no pneumothorax.  Long thoracic spine stabilization rods  are stable.  IMPRESSION: Airspace opacity noted on the prior exams in the right lung base is no longer present.  The lungs are not clear appear   Original Report Authenticated By: Amie Portland, M.D.   Dg Chest Port 1 View  09/20/2012   *RADIOLOGY REPORT*  Clinical Data: Aspiration pneumonia  PORTABLE CHEST - 1 VIEW  Comparison: 09/19/2012  Findings: Worsening infiltrate in the right upper lobe and right lower lobe.  Left lung remains clear.  Central venous catheter tip in the right atrium unchanged.  NG tube has been removed  IMPRESSION: Worsening infiltrate throughout the right lung, probable pneumonia.   Original Report Authenticated By: Janeece Riggers, M.D.   Medications: I have reviewed the patient's current medications. Scheduled Meds: . albuterol  2.5 mg Nebulization Q6H  . ampicillin-sulbactam (UNASYN) IV  3 g Intravenous Q6H  . calcium carbonate (dosed in mg elemental calcium)  1,000 mg of elemental calcium Oral BID WC  . feeding supplement (JEVITY 1.2 CAL)  237 mL Per Tube QID  . feeding supplement  30 mL Per Tube BID  . free water  250 mL  Per Tube Q8H  . gabapentin  800 mg Per Tube TID  . levETIRAcetam  2,000 mg Intravenous Q12H  . multivitamin  10 mL Oral Daily  . pantoprazole (PROTONIX) IV  40 mg Intravenous Q12H  . [START ON 09/22/2012] potassium chloride  40 mEq Per Tube BID  . sodium chloride  3 mL Intravenous Q12H  . vitamin C  500 mg Oral QHS   Continuous Infusions: . dextrose 5 % and 0.9% NaCl 100 mL/hr at 09/21/12 0800   PRN Meds:.sodium chloride, albuterol, sodium chloride  Assessment/Plan: 22 y.o. male w/ PMHx of Cerebral Palsy (spastic), epilepsy, and osteoporosis, presents to the ED w/ legal guardian w/ complaints of dark emesis, low grade fever, and lethargy for 2 days prior to admission.  #Dark emesis- Patient w/ vomiting at home, s/p PEG replacement on 09/13/12. Emesis appeared dark brown and slightly maroon and occurs after every tube feed. Most likely 2/2 gastric  mucosal irritation d/t new PEG placement. NO vomiting since admission -Continuing PEG feeds. On Jevity 1.2 formula: 1 can QID with Prostat liquid protein 30 ml BID. Tolerating well. -Continue Protonix 40 mg IV bid  -Given Zofran 4 mg prn  -Still on D5NS @ 100 ml/hr. Will decrease rate to 50 ml/hr.  #Aspiration pneumonia- Patient has a known history of aspiration pneumonia in the past. Repeat CXR on 09/18/12 showed significant worsening of right lung, w/ complete opacification of RL. Repeated CXR's show clearing w/ subsequent appearance of atelectasis. Most likely d/t repeated mucus plugging. Patient desatted overnight and RT removed large bloody mucus plug via suction. This AM, CXR looks like atelectasis/ poor respiration of right lung.  -Continue w/ vibra vest w/ increased frequency. Seen by pulmonary today, much appreciate them following the patient. Recommending conservative care. Also suggested d/c of Clindamycin at this time. -Continue Unasyn 3g IV q6h.   #Epilepsy- Patient has a known history of seizure disorder. The guardian claims that his seizures are well controlled on Keppra 1,750 mg by PEG Tube bid. He only has about 4 seizures per year. No reported seizures since Keppra dose was adjusted. -Continue Keppra 2000 mg IVPB bid for now. Patient's neurologist left a note saying that he does not need increased dose of Keppra. Will continue 2000 bid for now to keep patient seizure-free during admission. Consider starting Keppra via PEG in AM at home dose. 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 He has contractures in all extremities and does respond visually to pain. 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 The patient is much more alert than on admission. He receives Botox injections infrequently for spasticity, baclofen intrathecally, and neurontin.  -Stable at this  time. Moved from bed to chair today.  Dispo: Disposition is deferred at this time, awaiting improvement of current medical problems.  Anticipated discharge in approximately 2-3 day(s).   The patient does have a current PCP Mia Creek, MD) and does need an [facility] hospital follow-up appointment after discharge.  The patient does not have transportation limitations that hinder transportation to clinic appointments.  .Services Needed at time of discharge: Y = Yes, Blank = No PT:   OT:   RN:   Equipment:   Other:     LOS: 6 days   Jeffery Masson, MD 09/21/2012, 5:45 PM Pager: [phone]

## 2012-09-21 NOTE — Progress Notes (Signed)
PT Discharge Note  Patient Details Name: Jeffery [name] MRN: [id number] DOB: 04/08/1990   Cancelled Treatment:    Reason Eval/Treat Not Completed: PT screened, no needs identified, will sign off.  Pt currently at baseline of mobility and RN staff able to get pt OOB using a lift.  PT will sign off.   Providencia Hottenstein 09/21/2012, 12:34 PM  Jake Shark, PT DPT [phone]

## 2012-09-21 NOTE — Progress Notes (Signed)
Internal Medicine Attending  Date: 09/21/2012  Patient name: Jeffery [name] Medical record number: [id number] Date of birth: [birth year] Age: 22 y.o. Gender: male  I saw and evaluated the patient, and discussed his on A.M rounds with housestaff.  I reviewed the resident's note by Dr. Yetta Barre and I agree with the resident's findings and plans as documented in his note.

## 2012-09-21 NOTE — Progress Notes (Signed)
PULMONARY  / CRITICAL CARE MEDICINE  Name: Jeffery [name] MRN: [id number] DOB: 08-30-1990    ADMISSION DATE:  09/15/2012 CONSULTATION DATE:  8/19  REFERRING MD :  Drue Second   PRIMARY SERVICE:  Family medicine teaching service   CHIEF COMPLAINT:  Hypoxia   BRIEF PATIENT DESCRIPTION:  22 y.o. male w/ PMHx of Cerebral Palsy (spastic), epilepsy, and osteoporosis, presented to the ED on 8/15,  w/ legal guardian w/ report of dark emesis, low grade fever, and lethargy for 2 days prior to admission. This spontaneously resolved..Course complicated by break-thru seizure on 8/15, and progressive hypoxia. F/u CXR showed marked atelectasis w/ complete opacification of right hemithorax. PCCM asked to see for atx and hypoxia.   He has digital spasticity and flexion contractures requiring intrathecal baclofen pump.(Hickling)  SIGNIFICANT EVENTS / STUDIES:    LINES / TUBES: Left IJ CVL 8/18  CULTURES:   ANTIBIOTICS: clinda 8/15>>>8/21 unasyn 8/19>>>  SUBJECTIVE: Much more stable from a respiratory standpoint.  VITAL SIGNS: Temp:  [98 F (36.7 C)-98.9 F (37.2 C)] 98.9 F (37.2 C) (08/21 1144) Pulse Rate:  [62-115] 94 (08/21 1210) Resp:  [13-37] 19 (08/21 1210) BP: (94-125)/(16-74) 109/70 mmHg (08/21 1144) SpO2:  [90 %-99 %] 90 % (08/21 1210) Weight:  [49.5 kg (109 lb 2 oz)] 49.5 kg (109 lb 2 oz) (08/21 0600) 4 liters  PHYSICAL EXAMINATION: General:  Chronically ill appearing male, resting comfortably in chair  Neuro:  Resting comfortably. Non-verbal.  HEENT: MM dry, no JVD  Cardiovascular:  rrr Lungs:  Decreased on right/ scattered rhonchi  Abdomen:  Non-tender, + bowel sounds  Musculoskeletal:  Contracted/ weak   Recent Labs Lab 09/19/12 0500 09/20/12 0500 09/21/12 0540  NA 140 143 145  K 3.0* 3.5 2.8*  CL 100 104 114*  CO2 29 29 24   BUN 3* <3* 3*  CREATININE 0.44* 0.43* 0.35*  GLUCOSE 78 107* 108*    Recent Labs Lab 09/19/12 0500 09/20/12 0500 09/21/12 0540  HGB  12.9* 12.7* 12.6*  HCT 38.0* 38.1* 38.4*  WBC 4.4 4.0 4.1  PLT 138* 144* 139*   Dg Chest Port 1 View  09/21/2012   *RADIOLOGY REPORT*  Clinical Data: Short of breath.  Pneumonia  PORTABLE CHEST - 1 VIEW  Comparison: 09/20/2012  Findings: Recurrent right lower lobe and right upper lobe atelectasis with increased density and volume loss on the right. This may be due to mucous plugging.  Underlying pneumonia not excluded.  Left lung remains clear.  Central venous catheter tip remains in the right atrium.  IMPRESSION: Recurrent right upper lobe and right lower lobe atelectasis and volume loss.  Probable mucous plugging.   Original Report Authenticated By: Janeece Riggers, M.D.   Dg Chest Port 1 View  09/20/2012   *RADIOLOGY REPORT*  Clinical Data: Dyspnea.  PORTABLE CHEST - 1 VIEW  Comparison: 09/20/2012 at 5:37 a.m.  Findings: Opacities seen at the right lung base on the recent prior to chest radiographs has essentially resolved.  This rapid resolution would suggest that it was either asymmetric edema or atelectasis.  The lungs now appear clear.  The left central venous line has its tip projecting the right atrium, stable.  The cardiac silhouette is normal in size.  There is no pneumothorax.  Long thoracic spine stabilization rods are stable.  IMPRESSION: Airspace opacity noted on the prior exams in the right lung base is no longer present.  The lungs are not clear appear   Original Report Authenticated By: Amie Portland, M.D.  Dg Chest Port 1 View  09/20/2012   *RADIOLOGY REPORT*  Clinical Data: Aspiration pneumonia  PORTABLE CHEST - 1 VIEW  Comparison: 09/19/2012  Findings: Worsening infiltrate in the right upper lobe and right lower lobe.  Left lung remains clear.  Central venous catheter tip in the right atrium unchanged.  NG tube has been removed  IMPRESSION: Worsening infiltrate throughout the right lung, probable pneumonia.   Original Report Authenticated By: Janeece Riggers, M.D.    ASSESSMENT /  PLAN:  Acute hypoxic respiratory failure in the setting of right sided atelectasis w/ complete opacification of right hemi-thorax.   CXR revealed worsening infiltrate/atx. In regards to the atelectasis, this seems to fluctuate (more atelectatic 8/21) and supports poor mucocilliary clearance deficit, suspect that this is really more of a chronic problem than something acute  Recommendation - Continue conservative approach with vibra vest/bronchodilators and PRN suction - Continue unasyn, stop clinda, not adding much here.  - CXR in AM.  Patient seen and examined, agree with above note.  I dictated the care and orders written for this patient under my direction.  Alyson Reedy, MD [phone]

## 2012-09-22 ENCOUNTER — Inpatient Hospital Stay (HOSPITAL_COMMUNITY): Payer: Medicaid Other

## 2012-09-22 LAB — CBC WITH DIFFERENTIAL/PLATELET
Basophils Relative: 1 % (ref 0–1)
Eosinophils Absolute: 0.2 10*3/uL (ref 0.0–0.7)
Eosinophils Relative: 4 % (ref 0–5)
Hemoglobin: 12.6 g/dL — ABNORMAL LOW (ref 13.0–17.0)
Lymphs Abs: 0.9 10*3/uL (ref 0.7–4.0)
MCH: 33.2 pg (ref 26.0–34.0)
MCHC: 33.6 g/dL (ref 30.0–36.0)
MCV: 98.7 fL (ref 78.0–100.0)
Monocytes Relative: 11 % (ref 3–12)
Neutrophils Relative %: 62 % (ref 43–77)
Platelets: 151 10*3/uL (ref 150–400)
RBC: 3.8 MIL/uL — ABNORMAL LOW (ref 4.22–5.81)

## 2012-09-22 LAB — BASIC METABOLIC PANEL
BUN: 6 mg/dL (ref 6–23)
Creatinine, Ser: 0.47 mg/dL — ABNORMAL LOW (ref 0.50–1.35)
GFR calc Af Amer: >90 mL/min (ref >90)
GFR calc non Af Amer: >90 mL/min (ref >90)
Glucose, Bld: 122 mg/dL — ABNORMAL HIGH (ref 70–99)
Potassium: 3.6 mEq/L (ref 3.5–5.1)

## 2012-09-22 LAB — CULTURE, BLOOD (SINGLE): Culture: NO GROWTH

## 2012-09-22 LAB — STOOL CULTURE

## 2012-09-22 MED ORDER — PRO-STAT SUGAR FREE PO LIQD: 30.0000 mL | Freq: Two times a day (BID) | ORAL | Status: DC

## 2012-09-22 MED ORDER — AMOXICILLIN-POT CLAVULANATE 400-57 MG/5ML PO SUSR: 10.0000 mL | Freq: Two times a day (BID) | ORAL | Status: DC

## 2012-09-22 MED ORDER — AMOXICILLIN-POT CLAVULANATE 400-57 MG/5ML PO SUSR
10.0000 mL | Freq: Two times a day (BID) | ORAL | Status: DC
  Administered 2012-09-22: 10 mL via ORAL
  Filled 2012-09-22 (×5): qty 10

## 2012-09-22 MED ORDER — JEVITY 1.2 CAL PO LIQD: 237.0000 mL | Freq: Four times a day (QID) | ORAL | Status: DC

## 2012-09-22 MED ORDER — POTASSIUM CHLORIDE 20 MEQ/15ML (10%) PO LIQD
40.0000 meq | Freq: Once | ORAL | Status: AC
  Administered 2012-09-22: 40 meq via ORAL
  Filled 2012-09-22: qty 60

## 2012-09-22 MED ORDER — POTASSIUM CHLORIDE 20 MEQ/15ML (10%) PO LIQD: 40.0000 meq | Freq: Once | ORAL | Status: DC

## 2012-09-22 MED ORDER — LEVETIRACETAM 100 MG/ML PO SOLN
1750.0000 mg | Freq: Two times a day (BID) | ORAL | Status: DC
  Filled 2012-09-22 (×2): qty 20

## 2012-09-22 NOTE — Progress Notes (Signed)
PULMONARY  / CRITICAL CARE MEDICINE  Name: Jeffery [name] MRN: [id number] DOB: 01/06/1991    ADMISSION DATE:  09/15/2012 CONSULTATION DATE:  8/19  REFERRING MD :  Drue Second   PRIMARY SERVICE:  Family medicine teaching service   CHIEF COMPLAINT:  Hypoxia   BRIEF PATIENT DESCRIPTION:  22 y.o. male w/ PMHx of Cerebral Palsy (spastic), epilepsy, and osteoporosis, presented to the ED on 8/15,  w/ legal guardian w/ report of dark emesis, low grade fever, and lethargy for 2 days prior to admission. This spontaneously resolved..Course complicated by break-thru seizure on 8/15, and progressive hypoxia. F/u CXR showed marked atelectasis w/ complete opacification of right hemithorax. PCCM asked to see for atx and hypoxia.   He has digital spasticity and flexion contractures requiring intrathecal baclofen pump.(Hickling)  SIGNIFICANT EVENTS / STUDIES:    LINES / TUBES: Left IJ CVL 8/18  CULTURES:   ANTIBIOTICS: clinda 8/15>>>8/21 unasyn 8/19>>>  SUBJECTIVE: Much more stable from a respiratory standpoint.  VITAL SIGNS: Temp:  [97.4 F (36.3 C)-98.9 F (37.2 C)] 98.2 F (36.8 C) (08/22 0751) Pulse Rate:  [62-94] 84 (08/22 0751) Resp:  [11-28] 11 (08/22 0751) BP: (97-119)/(52-76) 116/67 mmHg (08/22 0751) SpO2:  [90 %-100 %] 99 % (08/22 0751) Weight:  [48 kg (105 lb 13.1 oz)] 48 kg (105 lb 13.1 oz) (08/22 0600) 4 liters  PHYSICAL EXAMINATION: General:  Chronically ill appearing male, resting comfortably in chair  Neuro:  Resting comfortably. Non-verbal.  HEENT: MM dry, no JVD  Cardiovascular:  rrr Lungs:  Decreased on right/ scattered rhonchi  Abdomen:  Non-tender, + bowel sounds  Musculoskeletal:  Contracted/ weak   Recent Labs Lab 09/21/12 0540 09/21/12 1600 09/22/12 0405  NA 145 141 139  K 2.8* 4.4 3.6  CL 114* 103 101  CO2 24 30 31   BUN 3* 5* 6  CREATININE 0.35* 0.47* 0.47*  GLUCOSE 108* 102* 122*    Recent Labs Lab 09/20/12 0500 09/21/12 0540 09/22/12 0405  HGB  12.7* 12.6* 12.6*  HCT 38.1* 38.4* 37.5*  WBC 4.0 4.1 4.0  PLT 144* 139* 151   Dg Chest Port 1 View  09/22/2012   CLINICAL DATA:  Aspiration.  EXAM: PORTABLE CHEST - 1 VIEW  COMPARISON:  09/21/2012  FINDINGS: The patient is rotated to the right on today's radiograph, reducing diagnostic sensitivity and specificity. Significantly improved aeration in the right lung, with only faint residual peripheral interstitial accentuation.  The patient is rotated to the right on today's radiograph, reducing diagnostic sensitivity and specificity. Thoracic rods and cerclage wires stable. The heart size within normal limits.  IMPRESSION: 1. Prominently improved aeration in the right lung.   Electronically Signed   By: Herbie Baltimore   On: 09/22/2012 07:47   Dg Chest Port 1 View  09/21/2012   *RADIOLOGY REPORT*  Clinical Data: Short of breath.  Pneumonia  PORTABLE CHEST - 1 VIEW  Comparison: 09/20/2012  Findings: Recurrent right lower lobe and right upper lobe atelectasis with increased density and volume loss on the right. This may be due to mucous plugging.  Underlying pneumonia not excluded.  Left lung remains clear.  Central venous catheter tip remains in the right atrium.  IMPRESSION: Recurrent right upper lobe and right lower lobe atelectasis and volume loss.  Probable mucous plugging.   Original Report Authenticated By: Janeece Riggers, M.D.   Dg Chest Port 1 View  09/20/2012   *RADIOLOGY REPORT*  Clinical Data: Dyspnea.  PORTABLE CHEST - 1 VIEW  Comparison: 09/20/2012 at  5:37 a.m.  Findings: Opacities seen at the right lung base on the recent prior to chest radiographs has essentially resolved.  This rapid resolution would suggest that it was either asymmetric edema or atelectasis.  The lungs now appear clear.  The left central venous line has its tip projecting the right atrium, stable.  The cardiac silhouette is normal in size.  There is no pneumothorax.  Long thoracic spine stabilization rods are stable.   IMPRESSION: Airspace opacity noted on the prior exams in the right lung base is no longer present.  The lungs are not clear appear   Original Report Authenticated By: Amie Portland, M.D.    ASSESSMENT / PLAN:  Acute hypoxic respiratory failure in the setting of right sided atelectasis w/ complete opacification of right hemi-thorax.   CXR revealed clearing of infiltrate/atx. In regards to the atelectasis, this seems to fluctuate (CXR much improved 8/22) and supports poor mucocilliary clearance deficit, suspect that this is really more of a chronic problem than something acute.   Recommendation - Continue conservative approach with vibra vest/bronchodilators and PRN suction - Continue unasyn, could change to Augmentin to complete 10d total  - d/c CPAP  Simonne Martinet, NP  Appointment made at 10:30 AM on Thursday 8/28 with Dr. Sherene Sires.  Patient will likely wax and wanes from a CXR standpoint, no need for bronch, no need for daily CXR at this point, continue pulmonary hygienes, can change unasyn to augmentin and complete a 10 day total.  PCCM will sign off, please call back if needed.  Alyson Reedy, M.D. [facility] Pulmonary/Critical Care Medicine. Pager: [phone]. After hours pager: [phone].

## 2012-09-22 NOTE — Discharge Summary (Signed)
Name: Jeffery [name] MRN: [id number] DOB: [birth year] 22 y.o. PCP: Mia Creek, MD  Date of Admission: 09/15/2012  9:11 AM Date of Discharge: 09/22/2012 Attending Physician: Dr. Margarito Liner  Discharge Diagnosis: 1. Aspiration Pneumonia/atelectasis- Patient presented after emesis 2/2 PEG replacement. Patient aspirated d/t recurrent vomiting. Treated w/ Clindamycin and Unasyn.  2. Hematemesis- Most likely d/t gastric irritation, PEG pulled back. CT showed PEG in proper place. No vomiting throughout admission. 3. Seizure Disorder- Had 3 seizures night after admission, most likely d/t subtherapeutic Keppra levels 2/2 to continuous emesis. Seizures controlled after Keppra was given IV. 4. Cerebral Palsy 5. Transaminitis  Discharge Medications:   Medication List         albuterol (2.5 MG/3ML) 0.083% nebulizer solution  Commonly known as:  PROVENTIL  Take 2.5 mg by nebulization every 4 (four) hours as needed for wheezing or shortness of breath.     albuterol 108 (90 BASE) MCG/ACT inhaler  Commonly known as:  PROVENTIL HFA;VENTOLIN HFA  Inhale 2 puffs into the lungs every 4 (four) hours as needed for shortness of breath.     amoxicillin-clavulanate 400-57 MG/5ML suspension  Commonly known as:  AUGMENTIN  Take 10 mLs by mouth every 12 (twelve) hours. Take 10 mL via PEG twice a day for 6 more days.     baclofen 40981 MCG/20ML Soln  Commonly known as:  GABLOFEN  326 mcg by Intrathecal route continuous.     BOTOX IM  Inject 1 mL into the muscle every 3 (three) months.     calcium carbonate (dosed in mg elemental calcium) 1250 MG/5ML  1,000 mg of elemental calcium by PEG Tube route 2 (two) times daily. 4 cc via peg tube     DIASTAT RE  Place 12.5 mg rectally as needed (grand mal seizure lasting >5 min or compromising).     feeding supplement (JEVITY 1.2 CAL) Liqd  Place 237 mLs into feeding tube 4 (four) times daily.     feeding supplement Liqd  Place 30 mLs into feeding tube 2  (two) times daily.     furosemide 20 MG tablet  Commonly known as:  LASIX  20 mg by PEG Tube route 4 (four) times daily as needed for fluid or edema.     lansoprazole 30 MG disintegrating tablet  Commonly known as:  PREVACID SOLUTAB  Increase to 30 mg BID until PEG replacement     levETIRAcetam 100 MG/ML solution  Commonly known as:  KEPPRA  1,750 mg by PEG Tube route 2 (two) times daily. 17.5 mls     multivitamin Liqd  10 mLs by PEG Tube route daily.     mupirocin ointment 2 %  Commonly known as:  BACTROBAN  Apply 1 application topically as needed (to G-T site).     NEURONTIN 250 MG/5ML solution  Generic drug:  gabapentin  800 mg by PEG Tube route 3 (three) times daily. 16 mL in tube     OXYGEN-HELIUM IN  Inhale into the lungs as needed.     polyethylene glycol packet  Commonly known as:  MIRALAX / GLYCOLAX  17 g by PEG Tube route daily as needed (Constipation).     potassium chloride 20 MEQ/15ML (10%) solution  Take 30 mLs (40 mEq total) by mouth once.     Vitamin C 500 MG/5ML Liqd  500 mg by PEG Tube route at bedtime.     ZINC PO  5 mLs by PEG Tube route daily. 244 mg / 5 mL  zinc solution        Disposition and follow-up:   Jeffery [name] was discharged from [facility] in goodcondition.  At the hospital follow up visit please address:  1. Follow up BMET on next PCP appointment d/t hypokalemia during hospital admission. Is patient tolerating new PEG feeds? Any recent seizures? Vomiting? Decreased SpO2 at home?  2.  Labs / imaging needed at time of follow-up: BMET  3.  Pending labs/ test needing follow-up: none  Follow-up Appointments:     Follow-up Information   Follow up with Mia Creek, MD On 09/27/2012. (10:45)    Specialty:  Internal Medicine   Contact information:   [address] [place] [address] [phone]       Follow up with Sandrea Hughs, MD On 09/28/2012. (10:30 AM)    Specialty:  Pulmonary Disease    Contact information:   [address] [place] [address] [phone]       Discharge Instructions: Discharge Orders   Future Appointments Provider Department Dept Phone   09/28/2012 10:30 AM Nyoka Cowden, MD [facility] [phone]   10/11/2012 9:30 AM Levert Feinstein, MD [facility] [phone]   12/26/2012 12:00 PM Deetta Perla, MD [facility] [phone]   Future Orders Complete By Expires   Call MD for:  difficulty breathing, headache or visual disturbances  As directed    Call MD for:  extreme fatigue  As directed    Call MD for:  persistant nausea and vomiting  As directed    Call MD for:  temperature >100.4  As directed    Diet - low sodium heart healthy  As directed    Increase activity slowly  As directed       Consultations:  Pulmonary, GI  Procedures Performed:  Dg Abd 1 View  09/15/2012   *RADIOLOGY REPORT*  Clinical Data: 22 year old male with cerebral palsy.  Recent G tube exchange, now with emesis.  Query subsequent duodenal obstruction. Post contrast injection through the G tube can be requested  ABDOMEN - 1 VIEW  Comparison: 09/15/2012 at 1045 hours. CT abdomen and pelvis 18[address].  Findings: Water soluble Omnipaque contrast was injected via the patient's existing G tube prior to this image.  Supine AP view at 1723 hours.  Moderate gaseous distention of the stomach as seen earlier, and also similar to that 2008 comparison.  The internal G tube balloon projects over the lower gastric body and is not appear in proximity of the antrum.  There is contrast in the gastric fundus, at the antrum, and also outlining some small bowel in the left upper quadrant at the level of the ligament of Treitz.  No contrast extravasation or abnormal collection identified.  Otherwise stable bowel gas pattern from earlier.  No dilated loops identified.  There is some retained stool in the colon.  Chronic osseous changes of cerebral palsy  with superimposed bilateral posterior spinal fusion rods and right lower quadrant spinal/bone stimulator device.  IMPRESSION: 1.  G-tube placement appears adequate and similar to that in 2008. Gaseous distention of the stomach, but some of the injected water soluble contrast has reached the ligament of Treitz without evidence of obstruction. 2.  Otherwise stable KUB appearance of the abdomen since 1048 hours today.   Original Report Authenticated By: Erskine Speed, M.D.   Ct Abdomen Pelvis W Contrast  09/15/2012   *RADIOLOGY REPORT*  Clinical Data: Assess  CT ABDOMEN  AND PELVIS WITH CONTRAST  Technique:  Multidetector CT imaging of the abdomen and pelvis was performed following the standard protocol during bolus administration of intravenous contrast. Oral contrast was administered.  Contrast: 80mL OMNIPAQUE IOHEXOL 300 MG/ML  SOLN  Comparison: February 08, 2006  Findings: There is artifact from extensive for spinal rod fixation.  There is consolidation throughout much of the right lower lobe. The visualized left base is clear.  Nasogastric tube tip is in the stomach.  Gastrostomy catheter also is positioned in the stomach with balloon distended along the anterior gastric wall.  No focal liver lesions are appreciated.  There is no biliary duct dilatation.  Spleen, pancreas, and adrenals appear within normal limits.  There is a 4 mm calculus in the lower pole of the right kidney which is nonobstructing.  Kidneys bilaterally otherwise appear within normal limits.  In the pelvis, there is rather marked thickening of the wall of the rectum.  There is also thickening of the wall of the mid to distal sigmoid colon.  There is no surrounding mesenteric inflammation. There is no pelvic mass or fluid collection. Appendix appears normal.  There is a stimulator overlying the right upper pelvis.  There is no bowel obstruction.  There is no apparent free air or portal venous air.  Contrast is seen throughout the colon.  There is  no ascites, adenopathy, or abscess apparent in the abdomen or pelvis.  There is marked scoliosis.  No blastic or lytic bone lesions are identified.  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 The acetabular shallow bilaterally.  IMPRESSION: There is rather marked thickening of the wall of the rectum and distal descending colon consistent with colitis and proctitis. There is no abscess in these areas.  There is consolidation throughout much of the right lower lobe.  Gastrostomy catheter is positioned within the stomach.  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  There is marked scoliosis with extensive right fixation.  Note that the rod fixation is causing considerable artifact making this study less than optimal.  There is a nonobstructing 4 mm calculus in the lower pole of the right kidney.  There is no hydronephrosis on either side.  No bowel obstruction.  Appendix appears normal.   Original Report Authenticated By: Bretta Bang, M.D.   Dg Chest Port 1 View  09/22/2012   CLINICAL DATA:  Aspiration.  EXAM: PORTABLE CHEST - 1 VIEW  COMPARISON:  09/21/2012  FINDINGS: The patient is rotated to the right on today's radiograph, reducing diagnostic sensitivity and specificity. Significantly improved aeration in the right lung, with only faint residual peripheral interstitial accentuation.  The patient is rotated to the right on today's radiograph, reducing diagnostic sensitivity and specificity. Thoracic rods and cerclage wires stable. The heart size within normal limits.  IMPRESSION: 1. Prominently improved aeration in the right lung.   Electronically Signed   By: Herbie Baltimore   On: 09/22/2012 07:47   Dg Chest Port 1 View  09/21/2012   *RADIOLOGY REPORT*  Clinical Data: Short of breath.  Pneumonia  PORTABLE CHEST - 1 VIEW  Comparison: 09/20/2012  Findings: Recurrent right lower lobe and right upper lobe atelectasis with increased  density and volume loss on the right. This may be due to mucous plugging.  Underlying pneumonia not excluded.  Left lung remains clear.  Central venous catheter tip remains in the right atrium.  IMPRESSION: Recurrent right upper lobe and right lower lobe atelectasis  and volume loss.  Probable mucous plugging.   Original Report Authenticated By: Janeece Riggers, M.D.   Dg Chest Port 1 View  09/20/2012   *RADIOLOGY REPORT*  Clinical Data: Dyspnea.  PORTABLE CHEST - 1 VIEW  Comparison: 09/20/2012 at 5:37 a.m.  Findings: Opacities seen at the right lung base on the recent prior to chest radiographs has essentially resolved.  This rapid resolution would suggest that it was either asymmetric edema or atelectasis.  The lungs now appear clear.  The left central venous line has its tip projecting the right atrium, stable.  The cardiac silhouette is normal in size.  There is no pneumothorax.  Long thoracic spine stabilization rods are stable.  IMPRESSION: Airspace opacity noted on the prior exams in the right lung base is no longer present.  The lungs are not clear appear   Original Report Authenticated By: Amie Portland, M.D.   Dg Chest Port 1 View  09/20/2012   *RADIOLOGY REPORT*  Clinical Data: Aspiration pneumonia  PORTABLE CHEST - 1 VIEW  Comparison: 09/19/2012  Findings: Worsening infiltrate in the right upper lobe and right lower lobe.  Left lung remains clear.  Central venous catheter tip in the right atrium unchanged.  NG tube has been removed  IMPRESSION: Worsening infiltrate throughout the right lung, probable pneumonia.   Original Report Authenticated By: Janeece Riggers, M.D.   Dg Chest Port 1 View  09/19/2012   *RADIOLOGY REPORT*  Clinical Data: Right-sided atelectasis.  PORTABLE CHEST - 1 VIEW  Comparison: 09/18/2012.  Findings: Patient is rotated. Left IJ central line tip projects over the right atrium.  Nasogastric tube is followed into the stomach.  Heart size normal.  Mild bibasilar air space disease,  markedly improved from complete opacification of the right hemithorax, seen on yesterday's exam.  Left lung is clear. Harrington rods span the visualized thoracolumbar spine.  IMPRESSION:  1.  Marked interval improvement in right lung aeration with mild residual right basilar airspace disease. 2.  Left IJ central line tip appears to be within the right atrium.   Original Report Authenticated By: Leanna Battles, M.D.   Dg Chest Port 1 View  09/18/2012   *RADIOLOGY REPORT*  Clinical Data: Central line placement.  PORTABLE CHEST - 1 VIEW  Comparison: Chest x-ray dated 09/15/2012 and CT scan of the abdomen and pelvis dated 09/15/2012  Findings:  Left-sided central line is in place and is probably in the right atrium.  The patient now has opacified the entire right lung with increased shift of the heart and mediastinal structures to the right.  Left lung is clear.  No pneumothorax.  NG tube tip is below the diaphragm.  IMPRESSION:  1.  There is now complete opacification of the right hemithorax, probably a combination of lung consolidation and atelectasis. 2.  Central line tip is most likely in the right atrium and could be retracted approximately 3 cm.   Original Report Authenticated By: Francene Boyers, M.D.   Dg Chest Portable 1 View  09/15/2012   *RADIOLOGY REPORT*  Clinical Data: Fever  PORTABLE CHEST - 1 VIEW  Comparison: 09/05/2012  Findings: The previously seen left-sided central venous line has been removed in the interval.  Postsurgical changes are again seen. The lungs are clear bilaterally.  The cardiac shadow is within normal limits.  IMPRESSION: No acute abnormality noted.   Original Report Authenticated By: Alcide Clever, M.D.   Dg Chest Port 1 View  09/05/2012   *RADIOLOGY REPORT*  Clinical Data: Bedside central venous  catheter placement.  PORTABLE CHEST - 1 VIEW  Comparison: Two-view chest x-ray 07/17/2007.  Findings: Left jugular central venous catheter tip projects over the lower SVC.  No evidence  of pneumothorax or mediastinal hematoma.  Patient rotated to the right.  Suboptimal inspiration with atelectasis in the lung bases.  Lungs otherwise clear. Cardiac silhouette normal in size, unchanged.  Harrington rods throughout the thoracic and visualized upper lumbar spine beginning at T2.  IMPRESSION:  1.  Left jugular central venous catheter tip projects over the lower SVC.  No acute complicating features. 2.  Suboptimal inspiration accounts for bibasilar atelectasis.  No acute cardiopulmonary disease otherwise.   Original Report Authenticated By: Hulan Saas, M.D.   Dg Abd Portable 1v  09/15/2012   *RADIOLOGY REPORT*  Clinical Data: Vomiting.  Fever.  PORTABLE ABDOMEN - 1 VIEW  Comparison: CT, 02/08/2006  Findings: There is a normal bowel gas pattern.  No evidence of obstruction.  The soft tissues are unremarkable.  Long spine stabilization rods are noted with the bony fusion spanning the visualized spine.  This is stable.  A spine stimulator lies in the right lower quadrant with its catheter extending into the lumbar spinal canal and superiorly above the included field of view in the thoracic spinal canal.  This is also stable.  IMPRESSION: No acute findings.  No evidence of obstruction.   Original Report Authenticated By: Amie Portland, M.D.   Admission HPI: Jeffery [name] is a 22 y.o. male w/ PMHx of Cerebral Palsy (spastic), epilepsy, and osteoporosis, presents to the ED w/ legal guardian w/ complaints of dark emesis, low grade fever, and lethargy for the past 2 days. The patient is non-verbal and immobile 2/2 his CP and all history obtained from his legal guardian. The patient has had a PEG tube since 2000 and recently had it replaced on 09/13/12 at [facility]. Since that time, his guardian reported that he has had vomiting w/ almost every tube feed (15-20 times in the last 2 days), resulting in a dark brown/maroon colored emesis. It was also reported that he has been lethargic, w/ a low grade fever,  which is very different from his baseline. The guardian is convinced that his symptoms are due to his new PEG b/c it is much longer than his previous one, which was a much smaller, pediatric "button" type. She also notes that when she feeds him through the PEG, he seems to be in pain, opening his eyes widely. She also reports that he has had some recent loose stools, a light brown/golden color. C. Diff was negative. The guardian also frequently checks his SpO2 at home and his baseline is 90-92%, but in the last day or so, he has been close to 86-88%. The patient was also reported to have desatted in the ED to 88% and was placed on 4L O2 via [place], resulting in 95-96% SpO2. The patient has a known history of aspiration pneumonia in the past.  On arrival to the ED, the patient was noted to have elevated WBCs to 14.9. Hb/Hct were 18.7 and 53.3, respectively. Lactate was 1.90. The patient was also found to have Abnormal LFTs; AST 53, ALT 101, TP 8.8, Tbili 1.6, Albumin 4.8, and Alk phos 202. The guardian reports that he has never had liver issues in the past. CXR and Abd XR were performed in the ED, both were normal.  Patient was also reported to have had a recent seizure in the ED, mild in nature w/ UE and  head tonic-clonic movements. The patient is well controlled on Keppra, however the patient has recently been vomiting after every PEG feeding, including after medication administration.  Hospital Course by problem list:   1. Aspiration Pneumonia- Patient was noted to have issues w/ aspiration pneumonia in the past, reported from his legal guardian/mother (nurse for 35 years). On presentation to the ED, it was reported that the patient had an episode of desaturation into the mid-80's at home along with elevated WBC's and a low grade fever. His mother reported that he is usually around 95%, but he had recently been vomiting since his PEG tube was replaced on 09/13/12. Just prior to this episode of decreased SpO2, the  patient had vomited and began to cough, a moment at which the mother claims was most likely an aspiration event. When the patient presented to the ED, a CXR was performed, which was clear w/ no signs of atelectasis or consolidation. The patient proceeded to have an episode of desaturation in the ED to the low 90's. He was placed on O2 via [place] at 4L, resulting in an SpO2 around 95-96%. He was also given 1 g Rocephin IV in the ED. The patient was admitted to the stepdown unit and a CT abdomen was performed to assess proper placement of his PEG tube and to rule out other possible causes of hematemesis. At this time, a bibasilar pulmonary infiltrate was noted. Jeffery [name] was started on Zosyn at this time. In the stepdown unit, the patient was switched to Clindamycin 600 mg IV q8h for aspiration pneumonia. On 09/18/12, the patient lost his peripheral IV and during the evening, a central line was placed. On CXR to assure proper line placement, the right lung showed complete opacification, suggesting worsening pneumonia, chemical pneumonitis, or atelectasis 2/2 mucus plugging in the upper airway. Pulmonary was consulted d/t significant worsening. CSR was repeated on 09/19/12, revealing significant improvement after vibra vest administration. At this time, Unasyn was also started for increased coverage for aspiration pneumonia, as per PCCM recommendations. CXR was repeated on the morning of 09/20/12, showing a slightly worsened picture. After further discussion with pulmonary, it was decided that this waxing and waning of CXR's was most likely d/t mucus plugging. Overnight on 09/20/12, the patient had an episode of coughing and desaturation. RT responded and suctioned the patient, removing a small mucus plug. Repeat CXR showed marked improvement. At this point, the vibra vest use frequency was increased and the patient was continued on Unasyn. Clindamycin was D/c'ed on the morning of 09/21/12 as per pulmonary recommendations. The  patient continued to improve w/ vibra vest usage. On 09/22/12, the patient was switched over from Unasyn to Augmentin suspension via PEG, which will be continued for 6 days after discharge for a total of 10 days of coverage. The patient was dsicharged with marked improvement of CXR and SpO2, satting in the mid to high 90's. Will follow up with Dr Sherene Sires in Pulmonology.  2. Hematemesis- Jeffery [name] presented to the ED w/ continuous vomiting for 2 days prior to admission after his PEG tube was changed. The vomiting was a dark, brown, coffee ground appearing substance, most likely d/t gastric mucosal irritation b/c of new PEG. He vomited once in the ED. Dr. [name] pulled the PEG back and performed abdominal CT, showing no abnormalities involving the PEG. The patient was switched to IV medications and kept w/out use of the PEG until 09/18/12 when free water was given to see if patient was tolerating PEG  use. No vomiting during the entire course of admission. On 09/18/12, the patient was started back on free water flushes and tolerated well. He also had some low blood sugars during this time (60-90) and was started on D5NS @ 100 ml/hr. On 09/20/12, the patient was started back on tube feeds after recommendations from the nutritionist. He was started on Jevity 1.2 formula: 1 can QID with Prostat liquid protein 30 ml BID, which was continued on discharge. D5NS was decreased to 50 ml/hr during this time and discontinued when the patient was tolerating tube feeds well. On 09/22/12, the patient was re-started on medication administration via PEG tube including Keppra 1750 bid. No other issues with PEG, tolerated medications and new feeding regimen.   3. Seizures- On presentation to the ED, it was noted that the patient had had one seizure prior to arrival at the hospital. After discussion w/ his mother, he is very well controlled on his home dose of Keppra 1750 mg bid. However, d/t frequent repeated episodes of vomiting, it is  likely the he became sub-therapeutic w/ Keppra, causing seizures to return. On the night of 09/18/12, he had 3 more seizures. Neurology was called and the patient was given an extra dose of Keppra 250 mg IV and his normal dose was increased to 2000 mg IV bid. The patient had no more seizures during the course of admission. His neurologist Dr. [name] wrote a note saying that he did not think the increased dosage was necessary as he was well controlled on 1750 bid. When patient was tolerating feeds, he was switched back to his home dose and given the Keppra through the PEG (on 09/22/12). Will follow up w/ Dr. [name] in November.   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 He has contractures in all extremities and does respond to verbal stimulation. 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 On admission, he was relatively somnolent, according to the mother, much different than his baseline. The patient became much more alert throughout his stay in the hospital and was sitting in his chair and smiling prior to discharge. On admission, the patient had both UE bandaged as he had previously had a tendon release surgery bilaterally. About half-way through the admission, the surgeon who performed the tendon release came to the hospital and removed the bandages. The incisions were clean, dry, and intact bilaterally. He also receives Botox injections for spasticity, baclofen intrathecally (has a pump), and neurontin.     Discharge Vitals:   BP 98/69  Pulse 77  Temp(Src) 97.5 F (36.4 C) (Axillary)  Resp 10  Ht 4\' 11"  (1.499 m)  Wt 105 lb 13.1 oz (48 kg)  BMI 21.36 kg/m2  SpO2 99%  Discharge Labs:  Results for orders placed during the hospital encounter of 09/15/12 (from the past 24 hour(s))  BASIC METABOLIC PANEL     Status: Abnormal   Collection Time    09/22/12  4:05 AM      Result Value Range   Sodium 139   135 - 145 mEq/L   Potassium 3.6  3.5 - 5.1 mEq/L   Chloride 101  96 - 112 mEq/L   CO2 31  19 - 32 mEq/L   Glucose, Bld 122 (*) 70 - 99 mg/dL   BUN 6  6 - 23 mg/dL   Creatinine, Ser 6.21 (*) 0.50 - 1.35 mg/dL   Calcium 9.0  8.4 - 30.8 mg/dL  GFR calc non Af Amer >90  >90 mL/min   GFR calc Af Amer >90  >90 mL/min  MAGNESIUM     Status: None   Collection Time    09/22/12  4:05 AM      Result Value Range   Magnesium 2.0  1.5 - 2.5 mg/dL  CBC WITH DIFFERENTIAL     Status: Abnormal   Collection Time    09/22/12  4:05 AM      Result Value Range   WBC 4.0  4.0 - 10.5 K/uL   RBC 3.80 (*) 4.22 - 5.81 MIL/uL   Hemoglobin 12.6 (*) 13.0 - 17.0 g/dL   HCT 16.1 (*) 09.6 - 04.5 %   MCV 98.7  78.0 - 100.0 fL   MCH 33.2  26.0 - 34.0 pg   MCHC 33.6  30.0 - 36.0 g/dL   RDW 40.9  81.1 - 91.4 %   Platelets 151  150 - 400 K/uL   Neutrophils Relative % 62  43 - 77 %   Neutro Abs 2.5  1.7 - 7.7 K/uL   Lymphocytes Relative 23  12 - 46 %   Lymphs Abs 0.9  0.7 - 4.0 K/uL   Monocytes Relative 11  3 - 12 %   Monocytes Absolute 0.4  0.1 - 1.0 K/uL   Eosinophils Relative 4  0 - 5 %   Eosinophils Absolute 0.2  0.0 - 0.7 K/uL   Basophils Relative 1  0 - 1 %   Basophils Absolute 0.0  0.0 - 0.1 K/uL  GLUCOSE, CAPILLARY     Status: Abnormal   Collection Time    09/22/12  4:05 AM      Result Value Range   Glucose-Capillary 122 (*) 70 - 99 mg/dL    Signed: Lars Masson, MD 09/22/2012, 7:52 PM   Time Spent on Discharge: 45 minutes Services Ordered on Discharge: none Equipment Ordered on Discharge: none

## 2012-09-22 NOTE — Progress Notes (Signed)
Internal Medicine Attending  Date: 09/22/2012  Patient name: Jeffery [name] Medical record number: [id number] Date of birth: [birth year] Age: 22 y.o. Gender: male  I saw and evaluated the patient. I reviewed the resident's note by Dr. Yetta Barre and I agree with the resident's findings and plans as documented in his note, with the following additional comments.  Patient is doing well today; chest x-ray looks good; he is having no problems with PEG tube feeds.  His mother would like to take him home, and this was cleared with pulmonary consultant by Dr. Yetta Barre.  Agree with plan to discharge home today with outpatient follow up next week.

## 2012-09-22 NOTE — Therapy (Signed)
Pt placed on CPap at previous settings with 3 LT bleed in. Tol well.

## 2012-09-22 NOTE — Progress Notes (Signed)
Central line removed as per md order. Pt stable without complication. Family at bedside, verbalized understanding of discharge instructions, appointments and plan.

## 2012-09-22 NOTE — Progress Notes (Addendum)
Subjective: [name] is a 22 y.o. male w/ PMHx of Cerebral Palsy (spastic), epilepsy, and osteoporosis, presents to the ED w/ legal guardian w/ complaints of dark emesis, low grade fever, and lethargy for 2 days prior to admission. The patient has had a PEG tube since 2000 and recently had it replaced on 09/13/12 at [facility]. Since that time, it is reported that he has had vomiting w/ almost every tube feed (15-20 times in the last 2 days), resulting in a dark brown/maroon colored emesis. Patient was shown to have RLL consolidation on CT, leukocytosis on admission, low grade fever, and history of aspiration pneumonia in the past.   Patient seen at bedside this AM. Patient much improved today. Smiling and much more responsive today. Tolerating tube feeds very well. No vomiting. CXR looks very clear this AM. Seen by pulmonary, suggest switching Unasyn to Augmentin for a 10 day total course (6 days on Augmentin). Feel that waxing/waning of CXR appearance is most likely a chronic issue for him.  Objective: Vital signs in last 24 hours: Filed Vitals:   09/21/12 2045 09/22/12 0000 09/22/12 0357 09/22/12 0600  BP: 99/58 106/64 119/76 106/52  Pulse: 65 65 62 65  Temp:  97.4 F (36.3 C) 97.5 F (36.4 C)   TempSrc:  Axillary Axillary   Resp: 22 11 11 14   Height:      Weight:    105 lb 13.1 oz (48 kg)  SpO2: 100% 100% 100% 99%   Weight change: -3 lb 4.9 oz (-1.5 kg)  Intake/Output Summary (Last 24 hours) at 09/22/12 0651 Last data filed at 09/22/12 4098  Gross per 24 hour  Intake 4051.67 ml  Output      0 ml  Net 4051.67 ml   Physical Exam: General: More alert, in no apparent distress. Patient has Cerebral Palsy, is nonverbal and immobile. HEENT: Oropharynx clear and non-erythematous  Lungs: Lung sound clear today, no wheezes or crackles. Heart: Regular rate and rhythm, no murmurs, gallops, or rubs Abdomen: Soft, non-tender, non-distended, normal bowel sounds. PEG in place. Site looks  clean w/out leak. Extremities: No cyanosis, clubbing, or edema. Patient w/ structural abnormalities involving the extremities. Both LE w/ contractures, UE bandages removed, tendon release incisions appear clean, dry and intact bilaterally. Neurologic: Patient w/ cerebral palsy. Patient smiling more this AM. Very responsive to verbal stimuli. Unable to properly assess neurological function.  Lab Results: Basic Metabolic Panel:  Recent Labs Lab 09/21/12 0920 09/21/12 1600 09/22/12 0405  NA  --  141 139  K  --  4.4 3.6  CL  --  103 101  CO2  --  30 31  GLUCOSE  --  102* 122*  BUN  --  5* 6  CREATININE  --  0.47* 0.47*  CALCIUM  --  9.4 9.0  MG 1.7  --  2.0   Liver Function Tests:  Recent Labs Lab 09/17/12 0708 09/19/12 0500  AST 22 16  ALT 38 21  ALKPHOS 122* 110  BILITOT 1.5* 0.6  PROT 6.1 5.9*  ALBUMIN 3.1* 2.9*    Recent Labs Lab 09/15/12 0939  LIPASE 31   CBC:  Recent Labs Lab 09/20/12 0500 09/21/12 0540 09/22/12 0405  WBC 4.0 4.1 4.0  NEUTROABS 2.8  --  2.5  HGB 12.7* 12.6* 12.6*  HCT 38.1* 38.4* 37.5*  MCV 98.2 99.0 98.7  PLT 144* 139* 151   Coagulation:  Recent Labs Lab 09/16/12 0142  LABPROT 14.8  INR 1.19  Urinalysis:  Recent Labs Lab 09/15/12 1300  COLORURINE AMBER*  LABSPEC 1.036*  PHURINE 5.5  GLUCOSEU NEGATIVE  HGBUR NEGATIVE  BILIRUBINUR SMALL*  KETONESUR 15*  PROTEINUR 100*  UROBILINOGEN 1.0  NITRITE NEGATIVE  LEUKOCYTESUR TRACE*    Micro Results: Recent Results (from the past 240 hour(s))  CLOSTRIDIUM DIFFICILE BY PCR     Status: None   Collection Time    09/14/12 10:45 AM      Result Value Range Status   C difficile by pcr Not Detected  Not Detected Final   Comment:       This assay detects the presence of Clostridium difficile DNA coding     for toxin B (tcdB) by real-time polymerase chain reaction (PCR)     amplification.     This test was developed and its performance characteristics have been      determined by [facility]. Performance characteristics refer     to the analytical performance of the test. This test has not been     cleared or approved by the [place] Food and Drug Administration. The FDA     has determined that such clearance or approval is not necessary. This     laboratory is certified under the Clinical Laboratory Improvement     Amendments of 1988 as qualified to perform high complexity clinical     laboratory testing.  CULTURE, BLOOD (SINGLE)     Status: None   Collection Time    09/15/12 11:30 AM      Result Value Range Status   Specimen Description BLOOD LEFT ARM   Final   Special Requests BOTTLES DRAWN AEROBIC ONLY 10CC   Final   Culture  Setup Time     Final   Value: 09/16/2012 01:03     Performed at [facility]   Culture     Final   Value:        BLOOD CULTURE RECEIVED NO GROWTH TO DATE CULTURE WILL BE HELD FOR 5 DAYS BEFORE ISSUING A FINAL NEGATIVE REPORT     Performed at [facility]   Report Status PENDING   Incomplete  MRSA PCR SCREENING     Status: None   Collection Time    09/16/12  2:44 AM      Result Value Range Status   MRSA by PCR NEGATIVE  NEGATIVE Final   Comment:            The GeneXpert MRSA Assay (FDA     approved for NASAL specimens     only), is one component of a     comprehensive MRSA colonization     surveillance program. It is not     intended to diagnose MRSA     infection nor to guide or     monitor treatment for     MRSA infections.  STOOL CULTURE     Status: None   Collection Time    09/18/12  2:03 PM      Result Value Range Status   Specimen Description STOOL   Final   Special Requests NONE   Final   Culture     Final   Value: NO SUSPICIOUS COLONIES, CONTINUING TO HOLD     Performed at [facility]   Report Status PENDING   Incomplete  CULTURE, RESPIRATORY (NON-EXPECTORATED)     Status: None   Collection Time    09/20/12  7:05 PM      Result Value Range Status  Specimen Description  TRACHEAL ASPIRATE   Final   Special Requests NONE   Final   Gram Stain     Final   Value: RARE WBC PRESENT,BOTH PMN AND MONONUCLEAR     RARE SQUAMOUS EPITHELIAL CELLS PRESENT     NO ORGANISMS SEEN     Performed at [facility]   Culture PENDING   Incomplete   Report Status PENDING   Incomplete   Studies/Results: Dg Chest Port 1 View  09/21/2012   *RADIOLOGY REPORT*  Clinical Data: Short of breath.  Pneumonia  PORTABLE CHEST - 1 VIEW  Comparison: 09/20/2012  Findings: Recurrent right lower lobe and right upper lobe atelectasis with increased density and volume loss on the right. This may be due to mucous plugging.  Underlying pneumonia not excluded.  Left lung remains clear.  Central venous catheter tip remains in the right atrium.  IMPRESSION: Recurrent right upper lobe and right lower lobe atelectasis and volume loss.  Probable mucous plugging.   Original Report Authenticated By: Janeece Riggers, M.D.   Dg Chest Port 1 View  09/20/2012   *RADIOLOGY REPORT*  Clinical Data: Dyspnea.  PORTABLE CHEST - 1 VIEW  Comparison: 09/20/2012 at 5:37 a.m.  Findings: Opacities seen at the right lung base on the recent prior to chest radiographs has essentially resolved.  This rapid resolution would suggest that it was either asymmetric edema or atelectasis.  The lungs now appear clear.  The left central venous line has its tip projecting the right atrium, stable.  The cardiac silhouette is normal in size.  There is no pneumothorax.  Long thoracic spine stabilization rods are stable.  IMPRESSION: Airspace opacity noted on the prior exams in the right lung base is no longer present.  The lungs are not clear appear   Original Report Authenticated By: Amie Portland, M.D.   Medications: I have reviewed the patient's current medications. Scheduled Meds: . albuterol  2.5 mg Nebulization Q6H  . ampicillin-sulbactam (UNASYN) IV  3 g Intravenous Q6H  . calcium carbonate (dosed in mg elemental calcium)  1,000 mg of  elemental calcium Oral BID WC  . feeding supplement (JEVITY 1.2 CAL)  237 mL Per Tube QID  . feeding supplement  30 mL Per Tube BID  . free water  250 mL Per Tube Q8H  . gabapentin  800 mg Per Tube TID  . levETIRAcetam  2,000 mg Intravenous Q12H  . multivitamin  10 mL Oral Daily  . pantoprazole (PROTONIX) IV  40 mg Intravenous Q12H  . sodium chloride  3 mL Intravenous Q12H  . vitamin C  500 mg Oral QHS   Continuous Infusions: . dextrose 5 % and 0.9% NaCl 50 mL/hr at 09/21/12 1801   PRN Meds:.sodium chloride, albuterol, sodium chloride  Assessment/Plan: 22 y.o. male w/ PMHx of Cerebral Palsy (spastic), epilepsy, and osteoporosis, presents to the ED w/ legal guardian w/ complaints of dark emesis, low grade fever, and lethargy for 2 days prior to admission.  #Dark emesis- Patient w/ vomiting at home, s/p PEG replacement on 09/13/12. Emesis appeared dark brown and slightly maroon and occurs after every tube feed. Most likely 2/2 gastric mucosal irritation d/t new PEG placement. No vomiting since admission. -Continuing PEG feeds. On Jevity 1.2 formula: 1 can QID with Prostat liquid protein 30 ml BID. Tolerating well, no residuals this AM. D/c'ed free water. -Continue Protonix 40 mg IV bid  -Given Zofran 4 mg prn  -D/c'ed D5NS this AM.  #Aspiration pneumonia- Patient has a  known history of aspiration pneumonia in the past. CXR on 09/18/12 showed significant worsening of right lung, w/ complete opacification of RL. This AM, chest XR looks clear. Seen by pulmonary, feel that waxing/waning of CXR appearance is most likely an ongoing issue. Suggested switching from Unasyn to Augmentin suspension via PEG.  -D/c'ed Unasyn, started Augmentin 10 ml suspension to be administered via PEG for 6 days on discharge.  #Epilepsy- Patient has a known history of seizure disorder. The guardian claims that his seizures are well controlled on Keppra 1,750 mg by PEG Tube bid. He only has about 4 seizures per year. No  reported seizures since Keppra dose was adjusted. -Switched to home dose of Keppra as per recs from Dr. [name]. Restarted 1750 mg bid via PEG. 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 He has contractures in all extremities and does respond visually to pain. 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 The patient is much more alert than on admission. He receives Botox injections infrequently for spasticity, baclofen intrathecally, and neurontin.  -Stable at this time. Sitting in chair today.  Dispo:  Anticipated discharge today. Discussed w/ Dr. Molli Knock, okay with discharge on 6 more days of Augmentin via PEG tube.   The patient does have a current PCP Mia Creek, MD) and does need an [facility] hospital follow-up appointment after discharge.  The patient does not have transportation limitations that hinder transportation to clinic appointments.  .Services Needed at time of discharge: Y = Yes, Blank = No PT: N  OT: N  RN: N  Equipment: N  Other: N    LOS: 7 days   Lars Masson, MD 09/22/2012, 6:51 AM Pager: [phone]

## 2012-09-23 LAB — CULTURE, RESPIRATORY W GRAM STAIN

## 2012-09-28 ENCOUNTER — Ambulatory Visit: Payer: Medicaid Other | Admitting: Internal Medicine

## 2012-09-29 ENCOUNTER — Other Ambulatory Visit: Payer: Self-pay

## 2012-09-29 DIAGNOSIS — G808 Other cerebral palsy: Secondary | ICD-10-CM

## 2012-09-29 MED ORDER — NEURONTIN 250 MG/5ML PO SOLN: ORAL | Status: DC

## 2012-10-05 ENCOUNTER — Ambulatory Visit (HOSPITAL_BASED_OUTPATIENT_CLINIC_OR_DEPARTMENT_OTHER)
Admission: RE | Admit: 2012-10-05 | Discharge: 2012-10-05 | Disposition: A | Discharge status: Home / Self Care | Payer: Medicaid Other | Source: Ambulatory Visit | Attending: Critical Care Medicine | Admitting: Critical Care Medicine

## 2012-10-05 ENCOUNTER — Ambulatory Visit (INDEPENDENT_AMBULATORY_CARE_PROVIDER_SITE_OTHER): Payer: Medicaid Other | Admitting: Critical Care Medicine

## 2012-10-05 ENCOUNTER — Encounter: Payer: Self-pay | Admitting: Critical Care Medicine

## 2012-10-05 VITALS — BP 110/78 | HR 82 | Ht 59.0 in | Wt 116.0 lb

## 2012-10-05 DIAGNOSIS — G808 Other cerebral palsy: Secondary | ICD-10-CM

## 2012-10-05 DIAGNOSIS — R05 Cough: Secondary | ICD-10-CM | POA: Insufficient documentation

## 2012-10-05 DIAGNOSIS — G4733 Obstructive sleep apnea (adult) (pediatric): Secondary | ICD-10-CM

## 2012-10-05 DIAGNOSIS — Z23 Encounter for immunization: Secondary | ICD-10-CM

## 2012-10-05 DIAGNOSIS — J690 Pneumonitis due to inhalation of food and vomit: Secondary | ICD-10-CM

## 2012-10-05 DIAGNOSIS — Z8701 Personal history of pneumonia (recurrent): Secondary | ICD-10-CM | POA: Insufficient documentation

## 2012-10-05 DIAGNOSIS — G809 Cerebral palsy, unspecified: Secondary | ICD-10-CM | POA: Insufficient documentation

## 2012-10-05 DIAGNOSIS — R059 Cough, unspecified: Secondary | ICD-10-CM | POA: Insufficient documentation

## 2012-10-05 NOTE — Patient Instructions (Addendum)
We will have [name] provide [place] with a download from bipap machine Use the Albuterol in the nebulizer twice daily and as needed Please have a chest x-ray today.  We will call with results. We will give you the Flu, Pneumonia, and TDap vaccines today.   Please follow up with Dr. Delford Field in 2 months.

## 2012-10-05 NOTE — Progress Notes (Addendum)
Subjective:    Patient ID: Jeffery [name], male    DOB: 1990-09-23, 22 y.o.   MRN: [id number]  HPI 22 y.o.M This patient has a history of cerebral palsy which is spastic associated epilepsy and osteoporosis. This patient had disruption of his feeding tube which resulted in large volume aspiration and hypoxic respiratory failure, on 09/15/2012. The patient was admitted to the ICU and required bronchoscopy to remove aspirated material from the right lung. There is complete atelectasis and marked opacification of the right hemithorax The patient is on bilevel at night with an associated fiber best. The patient formerly was with the pediatric pulmonologist and now the family wishes to establish in this practice. The patient is nonverbal but communicates with facial expressions. The patient is not take much by mouth and is using the G-tube for feedings. Patient does have a vibratory vest and utilizes that.  The patient is here today to establish Since home is better.  Goes to a day program usually Still on oxygen previously.     Receptive to interactions with facial communications.  No verbal.  Uses a computer with a switch.  No fever.  PEG is used for all means.    Pt has previous PNAs   Past Medical History  Diagnosis Date  . CP (cerebral palsy), spastic, quadriplegic   . Epilepsy   . Osteoporosis   . Inguinal hernia   . Undescended testes   . Seasonal allergies   . IVH (intraventricular hemorrhage)     Grade IV  . Hip dislocation, bilateral   . Pneumonia   . Seizures   . Hx: UTI (urinary tract infection)   . Dysphagia   . Otitis media   . Retinopathy of prematurity   . 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 Neuromuscular scoliosis   . Osteoporosis   . Complex partial seizures   . Generalized convulsive epilepsy without mention of intractable epilepsy   . Epilepsy   . Sinus bradycardia     HR drops to 38-40 while sleeping  . Sleep apnea     BiPAP  . Blister of right heel      fluid filled; origin unknown  . Kidney stones     ?     Family History  Problem Relation Age of Onset  . Adopted: Yes     History   Social History  . Marital Status: Single    Spouse Name: N/A    Number of Children: N/A  . Years of Education: N/A   Occupational History  . Student     Social History Main Topics  . Smoking status: Never Smoker   . Smokeless tobacco: Never Used  . Alcohol Use: No  . Drug Use: No  . Sexual Activity: No   Other Topics Concern  . Not on file   Social History Narrative  . No narrative on file     Allergies  Allergen Reactions  . Adhesive [Tape]     Rips skin off  . Depakote [Divalproex Sodium]     Causes pancreatitis      Outpatient Prescriptions Prior to Visit  Medication Sig Dispense Refill  . albuterol (PROVENTIL HFA;VENTOLIN HFA) 108 (90 BASE) MCG/ACT inhaler Inhale 2 puffs into the lungs every 4 (four) hours as needed for shortness of breath.      Marland Kitchen albuterol (PROVENTIL) (2.5 MG/3ML) 0.083% nebulizer solution Take 2.5 mg by nebulization 2 (two) times daily. And as needed      .  Ascorbic Acid (VITAMIN C) 500 MG/5ML LIQD 500 mg by PEG Tube route at bedtime.       . baclofen (GABLOFEN) 40000 MCG/20ML SOLN 326 mcg by Intrathecal route continuous.       . calcium carbonate, dosed in mg elemental calcium, 1250 MG/5ML 1,000 mg of elemental calcium by PEG Tube route 2 (two) times daily. 4 cc via peg tube      . Diazepam (DIASTAT RE) Place 12.5 mg rectally as needed (grand mal seizure lasting >5 min or compromising).       . feeding supplement (PRO-STAT SUGAR FREE 64) LIQD Place 30 mLs into feeding tube 2 (two) times daily.  900 mL  0  . furosemide (LASIX) 20 MG tablet 20 mg by PEG Tube route daily as needed for fluid or edema.       . levETIRAcetam (KEPPRA) 100 MG/ML solution 1,750 mg by PEG Tube route 2 (two) times daily. 17.5 mls      . Multiple Vitamin (MULTIVITAMIN) LIQD 10 mLs by PEG Tube route daily.      . Multiple  Vitamins-Minerals (ZINC PO) 5 mLs by PEG Tube route daily. 244 mg / 5 mL zinc solution      . mupirocin ointment (BACTROBAN) 2 % Apply 1 application topically as needed (to G-T site).  22 g  1  . NEURONTIN 250 MG/5ML solution Give 16 mL by mouth 3 times daily  1632 mL  2  . Nutritional Supplements (FEEDING SUPPLEMENT, JEVITY 1.2 CAL,) LIQD Place 237 mLs into feeding tube 4 (four) times daily.    0  . OnabotulinumtoxinA (BOTOX IM) Inject 1 mL into the muscle every 3 (three) months.      Docia Barrier IN Inhale into the lungs as needed.      . polyethylene glycol (MIRALAX / GLYCOLAX) packet 17 g by PEG Tube route daily as needed (Constipation).       . lansoprazole (PREVACID SOLUTAB) 30 MG disintegrating tablet Increase to 30 mg BID until PEG replacement  60 tablet  0  . potassium chloride 20 MEQ/15ML (10%) solution Take 30 mLs (40 mEq total) by mouth once.  30 mL  0  . amoxicillin-clavulanate (AUGMENTIN) 400-57 MG/5ML suspension Take 10 mLs by mouth every 12 (twelve) hours. Take 10 mL via PEG twice a day for 6 more days.  120 mL  0   Facility-Administered Medications Prior to Visit  Medication Dose Route Frequency Provider Last Rate Last Dose  . botulinum toxin Type A (BOTOX) injection 300 Units  300 Units Intramuscular Once Levert Feinstein, MD          Review of Systems Review of systems taken in detail and is positive as above otherwise negative    Objective:   Physical Exam Filed Vitals:   10/05/12 1454  BP: 110/78  Pulse: 82  Height: 4\' 11"  (1.499 m)  Weight: 116 lb (52.617 kg)  SpO2: 94%    Gen: Severe cerebral palsy male sitting in motorized wheelchair  ENT: No lesions,  mouth clear,  oropharynx clear, no postnasal drip  Neck: No JVD, no TMG, no carotid bruits  Lungs: No use of accessory muscles, no dullness to percussion, a few expired wheezes  Cardiovascular: RRR, heart sounds normal, no murmur or gallops, no peripheral edema  Abdomen: soft and NT, no HSM,  BS normal   G-tube in place  Musculoskeletal: Contraction deformities and atrophy both lower extremities and upper sternal days  Neuro: alert, non focal, the patient is nonverbal  but is interactive with facial expressions  Skin: Warm, no lesions or rashes  Dg Chest 1 View  10/05/2012   *RADIOLOGY REPORT*  Clinical Data:  Follow up aspiration pneumonia, persistent cough, history cerebral palsy  CHEST - 1 VIEW  Comparison: 09/22/2012  Findings: Thoracic deformity post spinal stabilization. Stable heart size and mediastinal contours. No definite pulmonary infiltrate, pleural effusion or pneumothorax. Minimal streaky atelectasis at left base. Bones demineralized.  IMPRESSION: Minimal streaky left basilar atelectasis.   Original Report Authenticated By: Ulyses Southward, M.D.   Bipap adherence:  70% >/= 4hrs per day Setting 12/7    Assessment & Plan:   Aspiration pneumonia Left lower lobe aspiration pneumonia now resolved on current chest x-ray Plan Continue pulmonary toilet with fiber best and nebulized therapy and aspiration precautions    severe sleep apnea Will assess bilevel support by obtaining a download from the bilevel device  Updated Medication List Outpatient Encounter Prescriptions as of 10/05/2012  Medication Sig Dispense Refill  . albuterol (PROVENTIL HFA;VENTOLIN HFA) 108 (90 BASE) MCG/ACT inhaler Inhale 2 puffs into the lungs every 4 (four) hours as needed for shortness of breath.      Marland Kitchen albuterol (PROVENTIL) (2.5 MG/3ML) 0.083% nebulizer solution Take 2.5 mg by nebulization 2 (two) times daily. And as needed      . Ascorbic Acid (VITAMIN C) 500 MG/5ML LIQD 500 mg by PEG Tube route at bedtime.       . baclofen (GABLOFEN) 40000 MCG/20ML SOLN 326 mcg by Intrathecal route continuous.       . calcium carbonate, dosed in mg elemental calcium, 1250 MG/5ML 1,000 mg of elemental calcium by PEG Tube route 2 (two) times daily. 4 cc via peg tube      . Diazepam (DIASTAT RE) Place 12.5 mg rectally as  needed (grand mal seizure lasting >5 min or compromising).       . feeding supplement (PRO-STAT SUGAR FREE 64) LIQD Place 30 mLs into feeding tube 2 (two) times daily.  900 mL  0  . furosemide (LASIX) 20 MG tablet 20 mg by PEG Tube route daily as needed for fluid or edema.       . lansoprazole (PREVACID SOLUTAB) 30 MG disintegrating tablet daily. Increase to 30 mg BID until PEG replacement      . levETIRAcetam (KEPPRA) 100 MG/ML solution 1,750 mg by PEG Tube route 2 (two) times daily. 17.5 mls      . Multiple Vitamin (MULTIVITAMIN) LIQD 10 mLs by PEG Tube route daily.      . Multiple Vitamins-Minerals (ZINC PO) 5 mLs by PEG Tube route daily. 244 mg / 5 mL zinc solution      . mupirocin ointment (BACTROBAN) 2 % Apply 1 application topically as needed (to G-T site).  22 g  1  . NEURONTIN 250 MG/5ML solution Give 16 mL by mouth 3 times daily  1632 mL  2  . Nutritional Supplements (FEEDING SUPPLEMENT, JEVITY 1.2 CAL,) LIQD Place 237 mLs into feeding tube 4 (four) times daily.    0  . nystatin ointment (MYCOSTATIN) Apply 1 application topically 2 (two) times daily.      . OnabotulinumtoxinA (BOTOX IM) Inject 1 mL into the muscle every 3 (three) months.      Docia Barrier IN Inhale into the lungs as needed.      . polyethylene glycol (MIRALAX / GLYCOLAX) packet 17 g by PEG Tube route daily as needed (Constipation).       . [DISCONTINUED] lansoprazole (PREVACID  SOLUTAB) 30 MG disintegrating tablet Increase to 30 mg BID until PEG replacement  60 tablet  0  . [DISCONTINUED] potassium chloride 20 MEQ/15ML (10%) solution Take 30 mLs (40 mEq total) by mouth once.  30 mL  0  . [DISCONTINUED] amoxicillin-clavulanate (AUGMENTIN) 400-57 MG/5ML suspension Take 10 mLs by mouth every 12 (twelve) hours. Take 10 mL via PEG twice a day for 6 more days.  120 mL  0   Facility-Administered Encounter Medications as of 10/05/2012  Medication Dose Route Frequency Provider Last Rate Last Dose  . botulinum toxin Type A  (BOTOX) injection 300 Units  300 Units Intramuscular Once Levert Feinstein, MD

## 2012-10-05 NOTE — Assessment & Plan Note (Signed)
Left lower lobe aspiration pneumonia now resolved on current chest x-ray Plan Continue pulmonary toilet with fiber best and nebulized therapy and aspiration precautions

## 2012-10-06 NOTE — Progress Notes (Signed)
Quick Note:  [employer] aware of pt's cxr results. ______

## 2012-10-06 NOTE — Progress Notes (Signed)
Quick Note:  Notify the patient that the Xray is stable and no pneumonia No change in medications are recommended. Continue current meds as prescribed at last office visit ______ 

## 2012-10-09 ENCOUNTER — Telehealth: Payer: Self-pay | Admitting: Family

## 2012-10-09 ENCOUNTER — Telehealth: Payer: Self-pay | Admitting: Critical Care Medicine

## 2012-10-09 DIAGNOSIS — G40219 Localization-related (focal) (partial) symptomatic epilepsy and epileptic syndromes with complex partial seizures, intractable, without status epilepticus: Secondary | ICD-10-CM

## 2012-10-09 DIAGNOSIS — G40319 Generalized idiopathic epilepsy and epileptic syndromes, intractable, without status epilepticus: Secondary | ICD-10-CM

## 2012-10-09 DIAGNOSIS — Z9189 Other specified personal risk factors, not elsewhere classified: Secondary | ICD-10-CM

## 2012-10-09 DIAGNOSIS — G808 Other cerebral palsy: Secondary | ICD-10-CM

## 2012-10-09 NOTE — Telephone Encounter (Signed)
No I do not think that is necessary based on the pts exposure to the brother alone

## 2012-10-09 NOTE — Telephone Encounter (Signed)
lmomtcb x1 for Jeffery [name]

## 2012-10-09 NOTE — Telephone Encounter (Signed)
I would leave his antiepileptic medication unchanged.  His seizures or not that frequent.  I'm happy to order the Ambu bag if we can get the proper codes.  Thanks

## 2012-10-09 NOTE — Telephone Encounter (Addendum)
Mom [name] called to let you know that [name] had a seizure on Saturday. I called Mom back and left a message at 955AM. I called her again at 3:30PM and was able to reach her. She said that she heard his O2 sat alarm go off and went in to find in him in seizure. The O2 sat was 49. His heart rate was in 50's which is not that uncommon for him, but his respirations were very shallow. She said that he was looking at her, his mouth was opening and closing, like lip smacking and his lips were blue. The seizure lasted 2 minutes. She said that this seizure behavior was different that in the past. She gave him O2 by nonrebreather mask at the time of the seizure but thought she was going to have to do more because he continued with shallow respirations and blue lips 1-2 minutes after the seizure behavior ended.  She did try to stimulate him to breathe during this time. She said that [place] had a xray last week and his lungs do not show pneumonia. He continues to have diarrhea but it has tested negative for C. Difficile. He has not gone back to his day program yet because of the diarrhea. He seemed otherwise at baseline Saturday before and after the seizure.   She also asked if you would order him an adult size ambu bag. He used to have a pediatric one from years ago at [facility] but it became too small and she threw it away. She said that when she was working with him Sat with the cyanosis that she realized it would be hard to do rescue breathing/CPR because of his condition/position. She asked that the order for it be sent to [facility].  I checked with [facility] and we can order an ambu bag - need to send order with dx codes to fax number [phone]. TG

## 2012-10-09 NOTE — Telephone Encounter (Signed)
Pt's mother returned triage's call.  Holly D Pryor ° °

## 2012-10-09 NOTE — Telephone Encounter (Signed)
I spoke with pt mother. She stated she tried to ger her other son that is age 22 a PNA vaccine but the pharmacy would not administer to him d/t age. The mother asked PCP about this and if they could write a letter and was told that would not probably work. She is wanting to know since pt did have PNA and his brother is exposed to him, does Dr. Delford Field feel the 48 y/o brother should get this vaccine. Please advise thanks

## 2012-10-09 NOTE — Telephone Encounter (Signed)
i spoke with pt mother and she is aware. Nothing further needed

## 2012-10-10 ENCOUNTER — Telehealth: Payer: Self-pay

## 2012-10-10 DIAGNOSIS — G40309 Generalized idiopathic epilepsy and epileptic syndromes, not intractable, without status epilepticus: Secondary | ICD-10-CM

## 2012-10-10 NOTE — Telephone Encounter (Signed)
I reviewed the extensive notes in spoke with mother.  The patient is at maximum doses for levetiracetam and Neurontin.  He did not tolerate Depakote because of pancreatic inflammation.  He would need to be placed on Onfi, or perhaps Felbatol.  I think that he should have an EEG first.  I will order this and have it performed at [facility] lab.  Please call mother when this is set up.

## 2012-10-10 NOTE — Telephone Encounter (Signed)
[name], mom, lvm stating that pt had a Grand Mal seizure that lasted 4 mins. His arms,legs,eyes,neck and head were jerking.  His O2 stat alarm went off and his O2 was 79. His lips did not turn blue this time. She put his O2 on 3 and was able to get it back up. [place] [facility] wants to speak with [name]. Please call her at [phone].

## 2012-10-10 NOTE — Telephone Encounter (Signed)
I faxed the order for the Ambu bag to [facility] at [phone]. I called Mom and let her know. TG

## 2012-10-10 NOTE — Telephone Encounter (Signed)
I called Mom back. She said that [name] had been ok today. This afternoon she changed him, got him up in his chair, did vest therapy. He tolerated that ok. He wears O2 @ 1 L during the day and had that on. She had him settled and went into another room to get something. She heard the O2 Sat alarm go off and came back. The O2 sat was 79 with him on 1 L O2. He was having the same mouth movements as he did Saturday, opening and closing quickly. She saw about 10 seconds of that, then he began rhythmic tonic-clonic movements of all extremities that lasted 3 1/2 to 4 minutes. Mom thinks the seizure lasted at least 4 minutes because of the time the O2 sat alarm when off. When the jerking movements ended and he looked at her, then went to sleep. Deep sleep at first, now more regular sleep. She said that he has never had a seizure that made him post-ictal. The only change in his treatment is that his PCP added Immodium for his diarrhea. It has helped with that and only had 1 diarrhea stool today. Mom also said to let you know that [facility] told her that the ambu-bag was approved by Medicaid so she will be getting that.  Mom is alarmed about him having a 4 min seizure today when he had a 2 min seizure on Sat. I told her that I would talk with Dr [name] and one of [place] would call her back. Mom's number is [phone]. TG

## 2012-10-11 ENCOUNTER — Telehealth: Payer: Self-pay | Admitting: Critical Care Medicine

## 2012-10-11 ENCOUNTER — Encounter: Payer: Self-pay | Admitting: Neurology

## 2012-10-11 ENCOUNTER — Ambulatory Visit (INDEPENDENT_AMBULATORY_CARE_PROVIDER_SITE_OTHER): Payer: Medicaid Other | Admitting: Neurology

## 2012-10-11 DIAGNOSIS — G40309 Generalized idiopathic epilepsy and epileptic syndromes, not intractable, without status epilepticus: Secondary | ICD-10-CM

## 2012-10-11 DIAGNOSIS — G8250 Quadriplegia, unspecified: Secondary | ICD-10-CM

## 2012-10-11 DIAGNOSIS — G808 Other cerebral palsy: Secondary | ICD-10-CM

## 2012-10-11 DIAGNOSIS — J690 Pneumonitis due to inhalation of food and vomit: Secondary | ICD-10-CM

## 2012-10-11 MED ORDER — ALBUTEROL SULFATE (2.5 MG/3ML) 0.083% IN NEBU: 2.5000 mg | INHALATION_SOLUTION | Freq: Two times a day (BID) | RESPIRATORY_TRACT | Status: DC

## 2012-10-11 MED ORDER — ONABOTULINUMTOXINA 100 UNITS IJ SOLR
300.0000 [IU] | Freq: Once | INTRAMUSCULAR | Status: AC
  Administered 2012-10-11: 300 [IU] via INTRAMUSCULAR

## 2012-10-11 NOTE — Progress Notes (Signed)
HPI:   Jeffery [name] came in for EMG guided BOTOX injection for bilateral upper extremity spasticity, accompanied by his mother.  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 His mother adopt him and his twin sister since 80 months old.  He is wheelchair bound, depend on PEG tube feeding, he can communicate some with facial expression, but non verbal. He has severe nonfunctional spastic qudraplegia, s/p baclofen bump placement at [facility]. Dr [name] refills his Baclofen bump, which helps his lower extremity spasticity, it is much easier for care giver to change his diaper, other wise he has frequent bilateral lower extremity spasticity, clonus, and hip pain.  He has been receiving BOTOX A injection from [facility] Dr. Yvone Neu since 2000, q3-4 months, it has been very helpful, his upper extremity is more relaxed, it is easier to put on cloth and clean his hands after injection, last injection from faxed over note was in 09/26/2009, 300 units were injected into bilateral biceps, brachioradialis, pronator teres, ECU, FDP, mother wants to change care locally for convience.  Mother reported benefit usually lasts less than 3 months, she would then noticed increased difficulty to clean his hands, he also had some improvement with his grip with BOTOX A,  H  UPDATE Sept 10th 2014: Last EMG guided Botox injection was in May 14th 2014, he did very well, he was admitted to hospital in the middle of August for pneumonia, he also recently underwent bilateral forearm deep tendon release surgery for his bilateral hands clawing, he healed very well from surgery.     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  Motor:  He has fixed contraction of bilateral elbow at 100 degree, only few upper extremity spontaneous activities, well healed bilateral forearm scar, fixed cupping of bilateral hand, fingers.  Bilateral lower extrmity has moderate spasticity on passive movement, fixed contraction of bilateral knee joints, no spontaneous movement of bilateral lower extremity. Coordination:  cannot test Gait and Station:  wheelchair-bound    Assessement and Plan:  22 yo male with cerebral palsy, severe mental retardation, EMG guided BOTOX A injection for bilateral upper extremity spasticity, he has Baclofen pump, which has helped his lower extremity spasticity.  300 units were used, 100 units were dissovled into 2 cc of NS. (lot [id number]  Exp Feb 2017)  Right biceps 50 units Right brachialis 50 units Right pronator teres 50 units   Left biceps 50  units, Left brachialis 50 units Left pronator teres 50 units    He tolerate the injection very well, will return to clinic in 3 months, for repeat injection.

## 2012-10-11 NOTE — Telephone Encounter (Signed)
Per 9.4.14 ov with PW: Patient Instructions    We will have [name] provide [place] with a download from bipap machine  Use the Albuterol in the nebulizer twice daily and as needed  Please have a chest x-ray today. We will call with results.  We will give you the Flu, Pneumonia, and TDap vaccines today.  Please follow up with Dr. Delford Field in 2 months.   Spoke with Crystal to reported that these results were indeed received and have been sent to scan Called spoke with patient's mother and informed her of the above [place] Lions verbalized her understanding She also reported that PW offered to refill meds at ov, and at that time she thought that pt had enough albuterol neb at home.  [facility] [address] [facility] [place].  This has been done.  Nothing further needed; will sign off.

## 2012-10-11 NOTE — Telephone Encounter (Signed)
The EEG is scheduled for 10/17/12 @ 11AM, arrival time 1045AM. I called mom and let her know. TG

## 2012-10-14 ENCOUNTER — Telehealth: Payer: Self-pay | Admitting: Pediatrics

## 2012-10-14 NOTE — Telephone Encounter (Signed)
Basic metabolic panel dated September 27, 2012 was received On October 12, 2012.  This was handed to the chart.  Electrolytes, calcium, glucose, and renal functions were normal. I did not contact the family.

## 2012-10-17 ENCOUNTER — Ambulatory Visit (HOSPITAL_COMMUNITY)
Admission: RE | Admit: 2012-10-17 | Discharge: 2012-10-17 | Disposition: A | Discharge status: Home / Self Care | Payer: Medicaid Other | Source: Ambulatory Visit | Attending: Pediatrics | Admitting: Pediatrics

## 2012-10-17 DIAGNOSIS — Z79899 Other long term (current) drug therapy: Secondary | ICD-10-CM | POA: Insufficient documentation

## 2012-10-17 DIAGNOSIS — G8250 Quadriplegia, unspecified: Secondary | ICD-10-CM | POA: Insufficient documentation

## 2012-10-17 DIAGNOSIS — R9401 Abnormal electroencephalogram [EEG]: Secondary | ICD-10-CM | POA: Insufficient documentation

## 2012-10-17 DIAGNOSIS — G40319 Generalized idiopathic epilepsy and epileptic syndromes, intractable, without status epilepticus: Secondary | ICD-10-CM | POA: Insufficient documentation

## 2012-10-17 DIAGNOSIS — R1110 Vomiting, unspecified: Secondary | ICD-10-CM | POA: Insufficient documentation

## 2012-10-17 DIAGNOSIS — G40309 Generalized idiopathic epilepsy and epileptic syndromes, not intractable, without status epilepticus: Secondary | ICD-10-CM

## 2012-10-17 NOTE — Progress Notes (Signed)
EEG Completed; Results Pending  

## 2012-10-18 ENCOUNTER — Telehealth: Payer: Self-pay | Admitting: Critical Care Medicine

## 2012-10-18 ENCOUNTER — Telehealth: Payer: Self-pay

## 2012-10-18 NOTE — Procedures (Signed)
EEG NUMBER:  [id number].  CLINICAL HISTORY:  The patient is a 23 year old male with general spastic quadriparesis, who had generalized seizures on September 15, 2012, after vomiting his Keppra.  He had 3 separate episodes.  He is on high dose of Keppra and Neurontin.  He takes a variety of other medications including albuterol, ascorbic acid, baclofen, calcium carbonate, Lasix, lansoprazole, multivitamins, mupirocin, Nystatin, Botox, propylene glycol, and rectal Diastat. (345.11)  PROCEDURE:  The tracing was carried out on a 32-channel digital Cadwell recorder, reformatted into 16-channel montages with one devoted to EKG. The patient was awake and drowsy during the recording.  RECORDING TIME:  21.5 minutes.  DESCRIPTION OF FINDINGS:  Background activity consists of low voltage 10- 25 microvolt lower theta, upper delta range activity.  Throughout the record, there were low-voltage solitary sharply contoured slow waves, maximal at T4-C3 simultaneously at 01 and 02, and in the right center and temporal occipital region.  None were associated with clinical accompaniments.  He had a brief occipitally predominant polyspike activity at the end of 18 Hz of photic stimulation, which was a photomyoclonic response, again unassociated with clinical accompaniments.  EKG showed regular sinus rhythm with ventricular response of 84 beats per minute.  IMPRESSION:  Abnormal EEG on the basis of very low voltage background consists of underlying static encephalopathy.  Multifocal sharp waves consistent with localization related seizures with secondary generalization.  No electrographic seizure activity was seen.     Deanna Artis. [name], M.D.    UUV:[id number] D:  10/18/2012 07:13:12  T:  10/18/2012 07:42:09  Job #:  [id number]

## 2012-10-18 NOTE — Telephone Encounter (Signed)
Patient's mother calling back stating she got vest working.  She states it just needing rebooting.  Nothing further needed.

## 2012-10-18 NOTE — Telephone Encounter (Signed)
[name], mom, lvm stating that [name] had an EEG performed yesterday. She would like the results. Please call [place] [facility] at [phone].

## 2012-10-19 ENCOUNTER — Telehealth: Payer: Self-pay

## 2012-10-19 ENCOUNTER — Telehealth: Payer: Self-pay | Admitting: Critical Care Medicine

## 2012-10-19 DIAGNOSIS — G808 Other cerebral palsy: Secondary | ICD-10-CM

## 2012-10-19 MED ORDER — BACLOFEN 10 MG PO TABS: ORAL_TABLET | ORAL | Status: DC

## 2012-10-19 NOTE — Telephone Encounter (Signed)
Called [place] and informed her.

## 2012-10-19 NOTE — Telephone Encounter (Signed)
[name], mom, lvm stating that she was cleaning out the medicine cabinet and found a 22 year old Rx for Baclofen 10 mg crush and administer tid. She said Dr.H gave it to her just incase the pump failed. She would [facility] [address]. Also would like a written order to put in his Medicaid book. [place] Lions can be reached at [phone].

## 2012-10-19 NOTE — Telephone Encounter (Signed)
Spoke with EC-- [name] States that Vest quit working properly last night and they were needing a new vest--[name] thought that patient would have to pay for another if there was no order for a new one. Spoke with medicare, machine is paid for and they are going to replace it for them. No order needed.

## 2012-10-19 NOTE — Telephone Encounter (Signed)
Please let Mom know that I sent the Rx in electronically and will mail her a medication list that includes the oral Baclofen. Thanks, [name]

## 2012-10-20 ENCOUNTER — Encounter: Payer: Self-pay | Admitting: Family

## 2012-10-20 ENCOUNTER — Telehealth: Payer: Self-pay | Admitting: Pediatrics

## 2012-10-20 ENCOUNTER — Telehealth: Payer: Self-pay | Admitting: Family

## 2012-10-20 DIAGNOSIS — G808 Other cerebral palsy: Secondary | ICD-10-CM

## 2012-10-20 DIAGNOSIS — Z9189 Other specified personal risk factors, not elsewhere classified: Secondary | ICD-10-CM | POA: Insufficient documentation

## 2012-10-20 DIAGNOSIS — G40319 Generalized idiopathic epilepsy and epileptic syndromes, intractable, without status epilepticus: Secondary | ICD-10-CM

## 2012-10-20 DIAGNOSIS — G40219 Localization-related (focal) (partial) symptomatic epilepsy and epileptic syndromes with complex partial seizures, intractable, without status epilepticus: Secondary | ICD-10-CM

## 2012-10-20 NOTE — Telephone Encounter (Signed)
Dr [name] called Mom yesterday. See note. TG

## 2012-10-20 NOTE — Telephone Encounter (Signed)
Order printed for items requested & sent to Dr [name] for signature. Will fax to [facility] and call Mom when signed. TG

## 2012-10-20 NOTE — Telephone Encounter (Signed)
I faxed the order to [facility]. I called Mom and let her know. She asked for me to send a note regarding these orders to her so she can document it for his care. The note was written and mailed to her. TG

## 2012-10-20 NOTE — Telephone Encounter (Signed)
Mom [name] called about order for ambu bag. She was told by [facility] that it needs to be constantly connected to a tank. They need an order for nonbreather mask and 15 L regulator to be used at time of seizures according to O2 sats < less than 90 or respiratory distress. Send to attention  Marcelino Duster in Respiratory at [facility].  Mom wants to know when order has been sent. TG

## 2012-10-20 NOTE — Telephone Encounter (Signed)
I spoke with mother.  The EEG shows a very slow background with low-voltage widely scattered sharp waves that are potentially epileptogenic, there were no electrographic seizures and no clinical seizures during this study.  I don't think that we have previous studies to compare.  It would make no changes in treatment.

## 2012-10-29 HISTORY — PX: RECTAL BIOPSY: SHX2303

## 2012-10-30 ENCOUNTER — Encounter (HOSPITAL_COMMUNITY)
Admission: RE | Disposition: A | Discharge status: Home / Self Care | Payer: Self-pay | Source: Ambulatory Visit | Attending: Gastroenterology

## 2012-10-30 ENCOUNTER — Encounter (HOSPITAL_COMMUNITY): Payer: Self-pay | Admitting: *Deleted

## 2012-10-30 ENCOUNTER — Ambulatory Visit (HOSPITAL_COMMUNITY)
Admission: RE | Admit: 2012-10-30 | Discharge: 2012-10-30 | Disposition: A | Discharge status: Home / Self Care | Payer: Medicaid Other | Source: Ambulatory Visit | Attending: Gastroenterology | Admitting: Gastroenterology

## 2012-10-30 ENCOUNTER — Other Ambulatory Visit: Payer: Self-pay | Admitting: Gastroenterology

## 2012-10-30 DIAGNOSIS — R933 Abnormal findings on diagnostic imaging of other parts of digestive tract: Secondary | ICD-10-CM | POA: Insufficient documentation

## 2012-10-30 DIAGNOSIS — R197 Diarrhea, unspecified: Secondary | ICD-10-CM | POA: Insufficient documentation

## 2012-10-30 DIAGNOSIS — K922 Gastrointestinal hemorrhage, unspecified: Secondary | ICD-10-CM | POA: Insufficient documentation

## 2012-10-30 DIAGNOSIS — Z79899 Other long term (current) drug therapy: Secondary | ICD-10-CM | POA: Insufficient documentation

## 2012-10-30 DIAGNOSIS — G808 Other cerebral palsy: Secondary | ICD-10-CM | POA: Insufficient documentation

## 2012-10-30 HISTORY — PX: FLEXIBLE SIGMOIDOSCOPY: SHX5431

## 2012-10-30 SURGERY — SIGMOIDOSCOPY, FLEXIBLE

## 2012-10-30 MED ORDER — SODIUM CHLORIDE 0.9 % IV SOLN: INTRAVENOUS | Status: DC

## 2012-10-30 NOTE — H&P (Signed)
  This 22 year old male with severe cerebral palsy presents to the endoscopy unit for the purpose of sigmoidoscopic evaluation, because of recent severe diarrhea which is the opposite of his normal constipation. He was recently in the hospital for aspiration pneumonia, a common problem for him, and since then he has had severe diarrhea. A CT scan raised the question of thickening of the rectosigmoid mucosa. He was started on Flagyl when seen by Dr. Randa Evens 3 days ago in the office, at which time this procedure was arranged.  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 He smiles in response to questions with favorable response is. Anicteric, no frank pallor. The chest has diffuse rhonchi. There is also some skeletal deformity of the chest, it appears. Heart normal, no murmur or arrhythmia appreciated. Abdomen without distention, mass effect, or tenderness.  Impression:  medically stable for unsedated flexible sigmoidoscopy for evaluation of recent diarrhea and radiographic abnormality of the rectosigmoid on CT scanning of uncertain clinical significance.  Plan: Proceed to sigmoidoscopic evaluation, without sedation. Probable random mucosal biopsies of the rectosigmoid region.  Florencia Reasons, M.D. [phone]

## 2012-10-30 NOTE — Op Note (Signed)
[facility] [address] [place], [address]   FLEXIBLE SIGMOIDOSCOPY PROCEDURE REPORT  PATIENT: Jeffery, [name]  MR#: [id number] BIRTHDATE: Jan 27, 1991 , 22  yrs. old GENDER: Male ENDOSCOPIST: Bernette Redbird, MD REFERRED BY:  Donia Guiles, MD PROCEDURE DATE:  10/30/2012 PROCEDURE:     flexible sigmoidoscopy with biopsies ASA CLASS: INDICATIONS: severe diarrhea following recent antibiotic therapy for aspiration pneumonia; also, radiographic thickening of the rectum and sigmoid on recent CT scan. C. difficile toxin repeatedly negative  MEDICATIONS:    none  DESCRIPTION OF PROCEDURE:   the patient was brought by his foster parents and legal guardians to the [facility] endoscopy unit as an outpatient. The procedure was done unprepped. Written consent was provided on his behalf. Time out was performed.  The Pentax adult video upper endoscope was used as the sigmoidoscope was procedureand was advanced easily to approximately 40 cm from the external anal opening, whereupon pullback was performed.  The mucosa looked normal. Specifically, I saw no evidence of pseudomembranes, nor other mucosal abnormalities such as mucosal edema, erythema, erosions, diverticulosis, ulcerations, polyps, or masses. There was quite a large amount of liquid "pea soupl" stool in the rectosigmoid lumen, much of which was able to be suctioned away. Where not already exposed, irrigation readily exposed additional mucosa and in all areas it was visualized, it had a normal vascular pattern and overall normal "glistening" mucosal appearance. Several random mucosal biopsies were obtained from the sigmoid region and the rectum.        COMPLICATIONS:  None  ENDOSCOPIC IMPRESSION: normal sigmoidoscopic exam except for large amount of liquid stool present  RECOMMENDATIONS:  await pathology results. Initiate probiotic therapy with Florastor   _______________________________ eSignedBernette Redbird, MD 10/30/2012 3:11 PM

## 2012-10-31 ENCOUNTER — Encounter (HOSPITAL_COMMUNITY): Payer: Self-pay | Admitting: Gastroenterology

## 2012-11-01 ENCOUNTER — Telehealth: Payer: Self-pay | Admitting: Family

## 2012-11-01 NOTE — Telephone Encounter (Signed)
Mom said that the lab results would be forwarded to this office by fax. TG

## 2012-11-01 NOTE — Telephone Encounter (Signed)
[name] left message about [name] saying that he has been having clonus in his arms, alternating right and left but not at same time. This started yesterday. Mom is concerned because he has never had clonus in his arms. He has continued to have diarrhea for past 5 weeks since being in hospital so is at Dr [name]'s today getting labs done (ordered by Dr Carman Ching, gastroenterology). He is having CMET, CBC, Lipase drawn and Mom has requested that a copy of labs should be sent to this office. Mom wonders if his sodium is low causing the clonus. Mom's # [id number]. I left a message for Mom and let her know that Dr Rexene Edison is out of the office this morning and that one of [place] would call her back this afternoon about the clonus. TG

## 2012-11-01 NOTE — Telephone Encounter (Signed)
This is just part of his spasticity.  I think it is worthwhile to check laboratories and please have them forwarded to this office, even if they're normal.

## 2012-11-02 ENCOUNTER — Telehealth: Payer: Self-pay | Admitting: Critical Care Medicine

## 2012-11-02 MED ORDER — AMOXICILLIN-POT CLAVULANATE 250-62.5 MG/5ML PO SUSR: ORAL | Status: DC

## 2012-11-02 NOTE — Telephone Encounter (Signed)
Labs being faxed by pt EC-- Labs drawn to rule out dehydration from where pt has had diarrhea since being in hospital and on IV ABX.   [name] (pt EC) states that pt R Lung sounds "junky" like hes got congestion in his lung Pts HR increased to 116, low grade temp according to [facility] of 99.9, excessive sweating noticed last today-thinks d/t fever breaking, increased cough with NO mucus production. Pt is still having diarrhea from IV ABX--labs to be faxed to Dr Lamar Benes (drawn to check for dehydration) [place] Lions states that patient is not acting like he is sick and she feels that this is 'non-emergent' , she does not feel that he is "sick". I explained to [place] Lions that I would let Dr Delford Field Nurse know what is going on with patient, [place] Lions wanted to know if she felt this would be something that should be addressed today or if it can wait until Monday when PW returns. [place] Lions states that she feels it can wait until then.   Please advise Crystal, thanks.

## 2012-11-02 NOTE — Telephone Encounter (Signed)
This pt needs to be called/checked in AM 10/3 ??needs CXR/OV

## 2012-11-02 NOTE — Telephone Encounter (Signed)
Per CY-Don't want another abx now to aggravate diarrhea. Do they do Flutter or VEST for Pulmonary Toilet?

## 2012-11-02 NOTE — Telephone Encounter (Signed)
I spoke with mother for about 3 minutes.  I think that this is related to spasticity and that he is having clonus in his arms.  I don't know why and I don't think that changing his baclofen pump is going to help area he had persistent diarrhea and has been seen by gastroenterologist with biopsies and appropriate stool cultures.  He is taking probiotics and Flagyl.  I don't have any other recommendations.

## 2012-11-02 NOTE — Telephone Encounter (Signed)
Called and spoke with lois and she is aware of rx for the augmentin that has been called to the pharmacy.       Crystal has placed the labs in PW look at.   Will forward to PW to make him aware.

## 2012-11-02 NOTE — Telephone Encounter (Signed)
Called pts mother to give her CY recs and she stated that she was not home the lasts 2 times that we have called her.  She just got home and she stated that the pt was given albuterol tx like he normally gets and HR was up to 140's.   She stated that he has had to be changed today x 2 bc he keeps breaking out in a cold sweat.  She stated that he acts anxious when they do the vest and his HR goes up then too.  She does not want to take him to the ER, but she feels that he needs to be placed on ABX that they can give him through the tube.   She stated that he is laying in the bed at this time and HR is 125 and he is normally in the 70's.  CY please advise again.  Thanks  Allergies  Allergen Reactions  . Adhesive [Tape]     Rips skin off  . Depakote [Divalproex Sodium]     Causes pancreatitis

## 2012-11-02 NOTE — Telephone Encounter (Signed)
This is a Congenital quadriplegia pt.   Labs were received. Further recs should be given by doc on call given PW is not reachable. I have placed phone msg along with last OV note and labs faxed by [facility] on CDY's cart. Dr. Maple Hudson, pls advise if pt needs any further recs prior to PW's return on Monday.   Thank you.

## 2012-11-02 NOTE — Telephone Encounter (Signed)
[place] Lions states that pt gets VEST therapy QID plus 1 additional today this morning d/t his increased symptoms. Uses BiPAP at night and sats dropped last night into the 70's -- [name] placed 3L/min O2 on pt @11pm  to get sats back up into the 90's. Pt was able to come off the O2 around 430-5am.  Please advise Dr Maple Hudson of any further recs. Thanks.

## 2012-11-02 NOTE — Telephone Encounter (Signed)
Recommend augmentin suspension 250/62.5/ 5 ml, Rx 140 ml, give 10 ml per feeding tube twice daily NR

## 2012-11-02 NOTE — Telephone Encounter (Signed)
Sounds as if they are doing exactly right with the VEST and O2. Ok to watch, continuing routine care through weekend if able. Follow up with Dr Delford Field as planned.

## 2012-11-02 NOTE — Telephone Encounter (Signed)
Lab results received and in Dr Hickling's office for review. Mom called and left message saying that she was anxious for call back. She is aware that lab results are within normal limits. TG

## 2012-11-03 NOTE — Telephone Encounter (Signed)
Called and spoke with pts mother.  She stated that the pt is much better this morning.  She wanted to thank CY for trusting her judgement and calling in the abx.  She was very grateful.  She appreciates all the good care that her son gets.

## 2012-11-04 ENCOUNTER — Emergency Department (HOSPITAL_COMMUNITY): Payer: Medicaid Other

## 2012-11-04 ENCOUNTER — Emergency Department (HOSPITAL_COMMUNITY)
Admission: EM | Admit: 2012-11-04 | Discharge: 2012-11-04 | Disposition: A | Discharge status: Home / Self Care | Payer: Medicaid Other | Attending: Emergency Medicine | Admitting: Emergency Medicine

## 2012-11-04 ENCOUNTER — Encounter (HOSPITAL_COMMUNITY): Payer: Self-pay | Admitting: *Deleted

## 2012-11-04 DIAGNOSIS — Z8701 Personal history of pneumonia (recurrent): Secondary | ICD-10-CM | POA: Insufficient documentation

## 2012-11-04 DIAGNOSIS — Z87828 Personal history of other (healed) physical injury and trauma: Secondary | ICD-10-CM | POA: Insufficient documentation

## 2012-11-04 DIAGNOSIS — R509 Fever, unspecified: Secondary | ICD-10-CM | POA: Insufficient documentation

## 2012-11-04 DIAGNOSIS — Z8679 Personal history of other diseases of the circulatory system: Secondary | ICD-10-CM | POA: Insufficient documentation

## 2012-11-04 DIAGNOSIS — R0602 Shortness of breath: Secondary | ICD-10-CM | POA: Insufficient documentation

## 2012-11-04 DIAGNOSIS — Z87448 Personal history of other diseases of urinary system: Secondary | ICD-10-CM | POA: Insufficient documentation

## 2012-11-04 DIAGNOSIS — Z8739 Personal history of other diseases of the musculoskeletal system and connective tissue: Secondary | ICD-10-CM | POA: Insufficient documentation

## 2012-11-04 DIAGNOSIS — R0989 Other specified symptoms and signs involving the circulatory and respiratory systems: Secondary | ICD-10-CM | POA: Insufficient documentation

## 2012-11-04 DIAGNOSIS — R61 Generalized hyperhidrosis: Secondary | ICD-10-CM | POA: Insufficient documentation

## 2012-11-04 DIAGNOSIS — Z87442 Personal history of urinary calculi: Secondary | ICD-10-CM | POA: Insufficient documentation

## 2012-11-04 DIAGNOSIS — Z79899 Other long term (current) drug therapy: Secondary | ICD-10-CM | POA: Insufficient documentation

## 2012-11-04 DIAGNOSIS — G808 Other cerebral palsy: Secondary | ICD-10-CM | POA: Insufficient documentation

## 2012-11-04 DIAGNOSIS — R0600 Dyspnea, unspecified: Secondary | ICD-10-CM

## 2012-11-04 DIAGNOSIS — Z87440 Personal history of urinary (tract) infections: Secondary | ICD-10-CM | POA: Insufficient documentation

## 2012-11-04 DIAGNOSIS — R0609 Other forms of dyspnea: Secondary | ICD-10-CM | POA: Insufficient documentation

## 2012-11-04 DIAGNOSIS — R059 Cough, unspecified: Secondary | ICD-10-CM | POA: Insufficient documentation

## 2012-11-04 DIAGNOSIS — R05 Cough: Secondary | ICD-10-CM | POA: Insufficient documentation

## 2012-11-04 DIAGNOSIS — G40909 Epilepsy, unspecified, not intractable, without status epilepticus: Secondary | ICD-10-CM | POA: Insufficient documentation

## 2012-11-04 DIAGNOSIS — Z8719 Personal history of other diseases of the digestive system: Secondary | ICD-10-CM | POA: Insufficient documentation

## 2012-11-04 DIAGNOSIS — R062 Wheezing: Secondary | ICD-10-CM | POA: Insufficient documentation

## 2012-11-04 DIAGNOSIS — R000 Tachycardia, unspecified: Secondary | ICD-10-CM | POA: Insufficient documentation

## 2012-11-04 DIAGNOSIS — Z8669 Personal history of other diseases of the nervous system and sense organs: Secondary | ICD-10-CM | POA: Insufficient documentation

## 2012-11-04 DIAGNOSIS — Z8709 Personal history of other diseases of the respiratory system: Secondary | ICD-10-CM | POA: Insufficient documentation

## 2012-11-04 LAB — COMPREHENSIVE METABOLIC PANEL
AST: 39 U/L — ABNORMAL HIGH (ref 0–37)
Albumin: 4.4 g/dL (ref 3.5–5.2)
Alkaline Phosphatase: 154 U/L — ABNORMAL HIGH (ref 39–117)
BUN: 26 mg/dL — ABNORMAL HIGH (ref 6–23)
Chloride: 105 mEq/L (ref 96–112)
GFR calc non Af Amer: >90 mL/min (ref >90)
Potassium: 4.3 mEq/L (ref 3.5–5.1)
Total Bilirubin: 0.5 mg/dL (ref 0.3–1.2)
Total Protein: 9.1 g/dL — ABNORMAL HIGH (ref 6.0–8.3)

## 2012-11-04 LAB — URINALYSIS, ROUTINE W REFLEX MICROSCOPIC
Bilirubin Urine: NEGATIVE
Glucose, UA: NEGATIVE mg/dL
Ketones, ur: NEGATIVE mg/dL
Nitrite: NEGATIVE
Specific Gravity, Urine: >1.046 — ABNORMAL HIGH (ref 1.005–1.030)
Urobilinogen, UA: 0.2 mg/dL (ref 0.0–1.0)

## 2012-11-04 LAB — CBC WITH DIFFERENTIAL/PLATELET
Basophils Absolute: 0 10*3/uL (ref 0.0–0.1)
Basophils Relative: 0 % (ref 0–1)
Eosinophils Absolute: 0.1 10*3/uL (ref 0.0–0.7)
Eosinophils Relative: 1 % (ref 0–5)
Hemoglobin: 18.6 g/dL — ABNORMAL HIGH (ref 13.0–17.0)
MCH: 34.3 pg — ABNORMAL HIGH (ref 26.0–34.0)
MCHC: 34.6 g/dL (ref 30.0–36.0)
Monocytes Relative: 9 % (ref 3–12)
Neutro Abs: 6.7 10*3/uL (ref 1.7–7.7)
Neutrophils Relative %: 80 % — ABNORMAL HIGH (ref 43–77)
Platelets: 166 10*3/uL (ref 150–400)
RDW: 13.4 % (ref 11.5–15.5)

## 2012-11-04 NOTE — Progress Notes (Signed)
assessed patient for wheeze protocol pt not wheezing has some upper airway congestion which he is able to cough on his own, patient unable to do peak flow due to mental capacity.

## 2012-11-04 NOTE — ED Provider Notes (Signed)
CSN: [id number]     Arrival date & time 11/04/12  1508 History   First MD Initiated Contact with Patient 11/04/12 1522     No chief complaint on file.  (Consider location/radiation/quality/duration/timing/severity/associated sxs/prior Treatment) Patient is a 22 y.o. male presenting with cough. The history is provided by a caregiver. The history is limited by the condition of the patient (MR, non-verbal, cerebral palsy).  Cough Cough characteristics:  Productive Sputum characteristics:  Nondescript Severity:  Moderate Onset quality:  Gradual Duration:  3 days Timing:  Constant Progression:  Worsening Chronicity:  Recurrent Context: upper respiratory infection (started on amoxil two days ago)   Relieved by:  Nothing Worsened by:  Nothing tried Ineffective treatments: amoxil. Associated symptoms: chills, diaphoresis, fever (99.9 rectally) and shortness of breath (back and forth from room air to 3L O2)   Associated symptoms: no chest pain, no myalgias and no rash   Risk factors: recent infection     Past Medical History  Diagnosis Date  . CP (cerebral palsy), spastic, quadriplegic   . Epilepsy   . Osteoporosis   . Inguinal hernia   . Undescended testes   . Seasonal allergies   . IVH (intraventricular hemorrhage)     Grade IV  . Hip dislocation, bilateral   . Seizures   . Hx: UTI (urinary tract infection)   . Dysphagia   . Otitis media   . Retinopathy of prematurity   . 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 Neuromuscular scoliosis   . Osteoporosis   . Complex partial seizures   . Generalized convulsive epilepsy without mention of intractable epilepsy   . Epilepsy   . Sinus bradycardia     HR drops to 38-40 while sleeping  . Sleep apnea     BiPAP  . Blister of right heel     fluid filled; origin unknown  . Kidney stones     ?  Marland Kitchen Pneumonia      chronic pneumonia ,respitory failure dx Augest 2014   Past Surgical History  Procedure Laterality Date  . Excision  of moles    . Peg placement      With multiple changes  . Gastrostomy tube, change / reposition  12/15/2010       . Button change  12/15/2010    Procedure: BUTTON CHANGE;  Surgeon: Iva Boop, MD;  Location: [facility];  Service: Endoscopy;  Laterality: N/A;  . Peg placement  10/07/2011    Procedure: PERCUTANEOUS ENDOSCOPIC GASTROSTOMY (PEG) REPLACEMENT;  Surgeon: Hart Carwin, MD;  Location: [facility];  Service: Endoscopy;  Laterality: N/A;  Needs 18 F 2.5 button ordered-dl  . Inguinal hernia repair Bilateral 1992  . Retinopathy of prematurity surgery  1992  . Achilles tendon lengthening  12/1998  . Soft tissue releases  wrists and fingers  12/1998  . Intrathecal baclofen pump placement  07/25/2000  . Peg placement N/A 06/05/2012    Procedure: PERCUTANEOUS ENDOSCOPIC GASTROSTOMY (PEG) REPLACEMENT;  Surgeon: Hart Carwin, MD;  Location: [facility];  Service: Endoscopy;  Laterality: N/A;  button 61fr.2.5cm  . Eye surgery Bilateral     Retinal   . Back surgery      Harrington Rods in back needs to be log rolled  . Tendon repair Bilateral 09/05/2012    Procedure: LENGTHENING OF DIGITAL FLEXOR TENDONS BILTERAL HANDS;  Surgeon: Jodi Marble, MD;  Location: [facility];  Service: Orthopedics;  Laterality: Bilateral;  . Hand surgery Bilateral Aug. 2014  . Peg  placement N/A 09/13/2012    Procedure: PERCUTANEOUS ENDOSCOPIC GASTROSTOMY (PEG) REPLACEMENT;  Surgeon: Hart Carwin, MD;  Location: [facility];  Service: Endoscopy;  Laterality: N/A;  . Flexible sigmoidoscopy N/A 10/30/2012    Procedure: FLEXIBLE SIGMOIDOSCOPY;  Surgeon: Florencia Reasons, MD;  Location: [facility];  Service: Endoscopy;  Laterality: N/A;   Family History  Problem Relation Age of Onset  . Adopted: Yes   History  Substance Use Topics  . Smoking status: Never Smoker   . Smokeless tobacco: Never Used  . Alcohol Use: No    Review of Systems  Unable to perform ROS: Patient nonverbal  Constitutional: Positive  for fever (99.9 rectally), chills and diaphoresis.  Respiratory: Positive for cough and shortness of breath (back and forth from room air to 3L O2).   Cardiovascular: Negative for chest pain.  Musculoskeletal: Negative for myalgias.  Skin: Negative for rash.    Allergies  Adhesive and Depakote  Home Medications   Current Outpatient Rx  Name  Route  Sig  Dispense  Refill  . albuterol (PROVENTIL HFA;VENTOLIN HFA) 108 (90 BASE) MCG/ACT inhaler   Inhalation   Inhale 2 puffs into the lungs every 4 (four) hours as needed for shortness of breath.         Marland Kitchen albuterol (PROVENTIL) (2.5 MG/3ML) 0.083% nebulizer solution   Nebulization   Take 3 mLs (2.5 mg total) by nebulization 2 (two) times daily. And as needed.  Dx: 507.0   300 mL   5   . amoxicillin-clavulanate (AUGMENTIN) 250-62.5 MG/5ML suspension   Oral   Take 250 mg by mouth 2 (two) times daily. 10 ml per feeding tube two times daily. Take for seven days         . Ascorbic Acid (VITAMIN C) 500 MG/5ML LIQD   PEG Tube   500 mg by PEG Tube route at bedtime.          . baclofen (GABLOFEN) 40000 MCG/20ML SOLN   Intrathecal   326 mcg by Intrathecal route continuous.          . calcium carbonate, dosed in mg elemental calcium, 1250 MG/5ML   PEG Tube   1,000 mg of elemental calcium by PEG Tube route 2 (two) times daily. 4 cc via peg tube         . Diazepam (DIASTAT RE)   Rectal   Place 12.5 mg rectally as needed (grand mal seizure lasting >5 min or compromising).          . ENSURE PLUS (ENSURE PLUS) LIQD   Oral   Take 237 mLs by mouth 2 (two) times daily between meals.         . feeding supplement (PRO-STAT SUGAR FREE 64) LIQD   Per Tube   Place 30 mLs into feeding tube 2 (two) times daily.   900 mL   0   . furosemide (LASIX) 20 MG tablet   PEG Tube   20 mg by PEG Tube route daily as needed for fluid or edema.          . lansoprazole (PREVACID SOLUTAB) 30 MG disintegrating tablet      daily. Increase to  30 mg BID until PEG replacement         . levETIRAcetam (KEPPRA) 100 MG/ML solution   PEG Tube   1,750 mg by PEG Tube route 2 (two) times daily. 17.5 mls         . loperamide (IMODIUM A-D) 2 MG tablet  Oral   Take 2 mg by mouth 4 (four) times daily as needed for diarrhea or loose stools.         . Multiple Vitamin (MULTIVITAMIN) LIQD   PEG Tube   10 mLs by PEG Tube route daily.         . Multiple Vitamins-Minerals (ZINC PO)   PEG Tube   5 mLs by PEG Tube route daily. 244 mg / 5 mL zinc solution         . mupirocin ointment (BACTROBAN) 2 %   Topical   Apply 1 application topically as needed (to G-T site).   22 g   1   . NEURONTIN 250 MG/5ML solution      Give 16 mL by mouth 3 times daily   1632 mL   2     Dispense as written.    Brand Name Medically Necessary   . OnabotulinumtoxinA (BOTOX IM)   Intramuscular   Inject 1 mL into the muscle every 3 (three) months. Last taken on 10-11-12         . OXYGEN-HELIUM IN   Inhalation   Inhale into the lungs as needed.         . polyethylene glycol (MIRALAX / GLYCOLAX) packet   PEG Tube   17 g by PEG Tube route daily as needed (Constipation).          Marland Kitchen saccharomyces boulardii (FLORASTOR) 250 MG capsule   Oral   Take 250 mg by mouth 2 (two) times daily.         Marland Kitchen trypsin-balsam-castor oil (XENADERM) ointment   Topical   Apply 1 application topically daily as needed for wound care.          BP 134/92  Pulse 108  Temp(Src) 99.7 F (37.6 C) (Rectal)  Resp 21  SpO2 95% Physical Exam  Constitutional: No distress.  Chronically ill appearing male, spastic quadriplegic, in NAD  HENT:  Head: Normocephalic and atraumatic.  Mouth/Throat: No oropharyngeal exudate.  Eyes: Conjunctivae and EOM are normal. Pupils are equal, round, and reactive to light.  Cardiovascular: Regular rhythm.  Tachycardia present.  Exam reveals no gallop and no friction rub.   No murmur heard. Pulmonary/Chest: Effort normal. No  respiratory distress. He has no decreased breath sounds. He has wheezes (mild, diffuse). He has rhonchi (diffuse but mild).  Abdominal: Soft. He exhibits no distension. There is no tenderness.  g tube in LUQ c/d/i  Musculoskeletal:  Spastic quadriplegia  Neurological: He is alert.  Non-verbal. Spastic quadriplegia.  Skin: Skin is warm and dry. No rash noted. He is not diaphoretic.    ED Course  Procedures (including critical care time) Labs Review Labs Reviewed  CBC WITH DIFFERENTIAL - Abnormal; Notable for the following:    Hemoglobin 18.6 (*)    HCT 53.8 (*)    MCH 34.3 (*)    Neutrophils Relative % 80 (*)    Lymphocytes Relative 10 (*)    All other components within normal limits  COMPREHENSIVE METABOLIC PANEL - Abnormal; Notable for the following:    Glucose, Bld 107 (*)    BUN 26 (*)    Total Protein 9.1 (*)    AST 39 (*)    Alkaline Phosphatase 154 (*)    All other components within normal limits  URINALYSIS, ROUTINE W REFLEX MICROSCOPIC - Abnormal; Notable for the following:    Specific Gravity, Urine >1.046 (*)    Hgb urine dipstick TRACE (*)    Protein,  ur 100 (*)    All other components within normal limits  URINE MICROSCOPIC-ADD ON - Abnormal; Notable for the following:    Casts GRANULAR CAST (*)    All other components within normal limits  CULTURE, BLOOD (ROUTINE X 2)  CULTURE, BLOOD (ROUTINE X 2)  URINE CULTURE   Imaging Review Dg Chest Port 1 View  11/04/2012   CLINICAL DATA:  Shortness of breath and cough.  EXAM: PORTABLE CHEST - 1 VIEW  COMPARISON:  10/05/2012  FINDINGS: The heart size and mediastinal contours are within normal limits. Both lungs are clear. The visualized skeletal structures show stable rib deformities and spinal fusion rods.  IMPRESSION: No active disease.   Electronically Signed   By: Irish Lack M.D.   On: 11/04/2012 15:47    MDM   1. Cough   2. Dyspnea     Patient is a 22 year old male with a history of cerebral palsy with  spastic quadriplegia and recent admission for aspiration pneumonia, started on amoxicillin as an outpatient 2 days ago for increasing dyspnea and productive cough, who presents for increased oxygen requirement, productive cough, low grade fevers, and tachycardia. Mom says over last three days he has been working harder to breathe and at night has intermittently required up to 3 liters of O2. Usually wears bipap at night for OSA. Fevers to 99.8 rectally, but normal for him per mom is 97 rectally. Also reports increased diarrhea but feeds have changed within last week. Has tested negative for c-diff three times but placed on flagyl anyway.  On exam, wheezes bilaterally mild. Mild rhoncorous breath sounds bilaterally. Afebrile but tachycardic. On 3L O2 with good pulse ox. Concern for pneumonia versus asthma. Will hydrate and give breathing treatments. Labs and CXR to eval.  No leukocytosis or anemia. CXR shows no consolidation, effusion, pneumothorax, or widened mediastinum. UA without signs of infection and normal lytes. Based on reassuring findings, presentation not consistent with pneumonia. No documented fevers and tachycardia improved to near 100 prior to discharge. Breath sounds remain reassuring. Patient has PCP follow up on Monday. Feel this is satisfactory as there are no signs of active infection. Detailed discussion with Mother had based on patient's chronic conditions and she is currently comfortable taking him home. Return precautions including fever, increasing O2 requirement, AMS discussed. Stable for d/c.  Discussed with the patient return precautions and need for follow up with PCP. Patient voiced understanding. Stable for d/c. This patient was discussed with my attending, Dr. Redgie Grayer.  Dorna Leitz, MD 11/05/12 0001

## 2012-11-04 NOTE — ED Notes (Signed)
Family waiting on discharge paperwork.

## 2012-11-04 NOTE — ED Notes (Signed)
Signature pad not working in room. Discharge papers reviewed with pt's legal gaurdian [name].

## 2012-11-04 NOTE — ED Notes (Signed)
Pt HAS CEREBRAL PALSY AND IS WHEELCHAIR BOUND.  PT WAS RECENTLY HERE WITH ASPIRATION PNEUMONIA.  TWO DAYS AGO STARTED HAVING FEVER, RIGHT MID UPPER LOBES SOUNDED OFFICE.  PT WAS PLACED ON AMOXICILLIN.  PT USES BIPAP AT NIGHT AND HAD TO HAVE 3 L RAN THROUGH THAT.  PT IS ON 3L/O2.  PT HR 130-150 AND RECTAL TEMP 99.9.  PT HAS BEEN HAVING SWEATS, REPORTS MASSIVE DIARRHEA.

## 2012-11-05 NOTE — ED Provider Notes (Signed)
I saw and evaluated the patient, reviewed the resident's note and I agree with the findings and plan.  22 year old male with cerebral palsy who is well known to me presents with mother who is concerned for possible pneumonia.  Lengthy observation in the emergency department and workup to include negative blood work, urine, chest x-ray. I a lengthy discussion with the mom with Dr. Durward Fortes who agrees with the discharge to home. At one point mom is requesting a placement of a PICC line however she agrees to followup with primary care provider to discuss outpatient PICC line placement. She is aware that I work tomorrow and will return emergency department at that time or sooner for other issues.  Darlys Gales, MD 11/05/12 [phone]

## 2012-11-06 ENCOUNTER — Telehealth: Payer: Self-pay | Admitting: Critical Care Medicine

## 2012-11-06 LAB — URINE CULTURE: Colony Count: NO GROWTH

## 2012-11-06 MED ORDER — PREDNISONE 10 MG PO TABS: ORAL_TABLET | ORAL | Status: DC

## 2012-11-06 NOTE — Telephone Encounter (Signed)
We have received the fax.  I have given it to Dr. Delford Field.   [place] [facility] would like PW to be aware she sent same fax to Dr. Randa Evens and what is going on with his "belly and chest are hand in hand."

## 2012-11-06 NOTE — Telephone Encounter (Signed)
Prednisone 10mg  Take 4 for three days 3 for three days 2 for three days 1 for three days and stop  #30  [facility].    She needs to : Stop Prevacid, calcium carbonate, miralax, augmentin.   Vest treatments  Stay on nebulizer 3-4 times per day   Stay on florastor  Needs OV next week

## 2012-11-06 NOTE — Telephone Encounter (Signed)
I spoke with pt mother. She stated she faxed a note on pt this morning to what she thinks she faxed to [facility]. She stated she is not sure what office she spoke with this AM but was told the fax was received. She does not know who she spoke with. I spoke with crystal and she will check in [place]. There is nothing here in [place] office

## 2012-11-06 NOTE — Telephone Encounter (Signed)
[facility]  Jeffery [name] is aware.  She is also aware of recs below per PW.  We have scheduled Jeffery [name] to see PW on Monday, Oct 13 at 4:30 pm in [place].  Jeffery [name] is aware of this as well and is to call back if anything further is needed prior to this.

## 2012-11-06 NOTE — Telephone Encounter (Signed)
I do not see anything here in [place].  I have also spoken with the ladies upfront, and they have nothing on this pt.   I called pt's care giver back, states she faxed this last night to [phone] and called this am and was advised it was received. She states she wanted "pt seen today but now it's too late" and "its too much to go into over the phone."  States this fax explains everything that has went on over this past weekend.  I apologized for this and advised I would give fax to PW to review once I receive it and if not received in the next little bit, I would call her back.

## 2012-11-06 NOTE — Telephone Encounter (Signed)
Pt's mother decided not to leave a message at this time.  Will call back at a later date to schedule.  Antionette Fairy

## 2012-11-07 ENCOUNTER — Telehealth: Payer: Self-pay | Admitting: Family

## 2012-11-07 ENCOUNTER — Ambulatory Visit (HOSPITAL_COMMUNITY)
Admission: RE | Admit: 2012-11-07 | Discharge: 2012-11-07 | Disposition: A | Discharge status: Home / Self Care | Payer: Medicaid Other | Source: Ambulatory Visit | Attending: Adult Health | Admitting: Adult Health

## 2012-11-07 ENCOUNTER — Telehealth: Payer: Self-pay | Admitting: Critical Care Medicine

## 2012-11-07 ENCOUNTER — Emergency Department (HOSPITAL_BASED_OUTPATIENT_CLINIC_OR_DEPARTMENT_OTHER)
Admission: EM | Admit: 2012-11-07 | Discharge: 2012-11-07 | Disposition: A | Discharge status: Home / Self Care | Payer: Medicaid Other | Attending: Emergency Medicine | Admitting: Emergency Medicine

## 2012-11-07 ENCOUNTER — Ambulatory Visit (INDEPENDENT_AMBULATORY_CARE_PROVIDER_SITE_OTHER): Payer: Medicaid Other | Admitting: Adult Health

## 2012-11-07 ENCOUNTER — Other Ambulatory Visit (INDEPENDENT_AMBULATORY_CARE_PROVIDER_SITE_OTHER): Payer: Medicaid Other

## 2012-11-07 ENCOUNTER — Encounter: Payer: Self-pay | Admitting: Adult Health

## 2012-11-07 ENCOUNTER — Encounter (HOSPITAL_BASED_OUTPATIENT_CLINIC_OR_DEPARTMENT_OTHER): Payer: Self-pay | Admitting: *Deleted

## 2012-11-07 VITALS — BP 134/84 | HR 115 | Temp 96.8°F

## 2012-11-07 DIAGNOSIS — Z87828 Personal history of other (healed) physical injury and trauma: Secondary | ICD-10-CM | POA: Insufficient documentation

## 2012-11-07 DIAGNOSIS — G40209 Localization-related (focal) (partial) symptomatic epilepsy and epileptic syndromes with complex partial seizures, not intractable, without status epilepticus: Secondary | ICD-10-CM | POA: Insufficient documentation

## 2012-11-07 DIAGNOSIS — Z8701 Personal history of pneumonia (recurrent): Secondary | ICD-10-CM | POA: Insufficient documentation

## 2012-11-07 DIAGNOSIS — G40309 Generalized idiopathic epilepsy and epileptic syndromes, not intractable, without status epilepticus: Secondary | ICD-10-CM | POA: Insufficient documentation

## 2012-11-07 DIAGNOSIS — E860 Dehydration: Secondary | ICD-10-CM | POA: Insufficient documentation

## 2012-11-07 DIAGNOSIS — R509 Fever, unspecified: Secondary | ICD-10-CM

## 2012-11-07 DIAGNOSIS — R05 Cough: Secondary | ICD-10-CM

## 2012-11-07 DIAGNOSIS — Z87442 Personal history of urinary calculi: Secondary | ICD-10-CM | POA: Insufficient documentation

## 2012-11-07 DIAGNOSIS — Z79899 Other long term (current) drug therapy: Secondary | ICD-10-CM | POA: Insufficient documentation

## 2012-11-07 DIAGNOSIS — J690 Pneumonitis due to inhalation of food and vomit: Secondary | ICD-10-CM | POA: Insufficient documentation

## 2012-11-07 DIAGNOSIS — Z87440 Personal history of urinary (tract) infections: Secondary | ICD-10-CM | POA: Insufficient documentation

## 2012-11-07 DIAGNOSIS — R197 Diarrhea, unspecified: Secondary | ICD-10-CM | POA: Insufficient documentation

## 2012-11-07 DIAGNOSIS — Z8719 Personal history of other diseases of the digestive system: Secondary | ICD-10-CM | POA: Insufficient documentation

## 2012-11-07 DIAGNOSIS — IMO0002 Reserved for concepts with insufficient information to code with codable children: Secondary | ICD-10-CM | POA: Insufficient documentation

## 2012-11-07 DIAGNOSIS — Z8739 Personal history of other diseases of the musculoskeletal system and connective tissue: Secondary | ICD-10-CM | POA: Insufficient documentation

## 2012-11-07 DIAGNOSIS — Z792 Long term (current) use of antibiotics: Secondary | ICD-10-CM | POA: Insufficient documentation

## 2012-11-07 LAB — BASIC METABOLIC PANEL
BUN: 35 mg/dL — ABNORMAL HIGH (ref 6–23)
CO2: 28 mEq/L (ref 19–32)
Calcium: 9.4 mg/dL (ref 8.4–10.5)
Creatinine, Ser: 0.8 mg/dL (ref 0.4–1.5)
Glucose, Bld: 121 mg/dL — ABNORMAL HIGH (ref 70–99)
Potassium: 3.8 mEq/L (ref 3.5–5.1)

## 2012-11-07 LAB — CBC WITH DIFFERENTIAL/PLATELET
Basophils Absolute: 0 10*3/uL (ref 0.0–0.1)
Eosinophils Absolute: 0 10*3/uL (ref 0.0–0.7)
Eosinophils Relative: 0 % (ref 0.0–5.0)
Lymphocytes Relative: 9.7 % — ABNORMAL LOW (ref 12.0–46.0)
MCHC: 33.4 g/dL (ref 30.0–36.0)
Neutro Abs: 7.7 10*3/uL (ref 1.4–7.7)
Neutrophils Relative %: 81.8 % — ABNORMAL HIGH (ref 43.0–77.0)
Platelets: 166 10*3/uL (ref 150.0–400.0)
RDW: 13.9 % (ref 11.5–14.6)

## 2012-11-07 MED ORDER — SODIUM CHLORIDE 0.9 % IV BOLUS (SEPSIS)
1000.0000 mL | Freq: Once | INTRAVENOUS | Status: AC
  Administered 2012-11-07: 1000 mL via INTRAVENOUS

## 2012-11-07 MED ORDER — SODIUM CHLORIDE 0.9 % IV BOLUS (SEPSIS): 500.0000 mL | Freq: Once | INTRAVENOUS | Status: DC

## 2012-11-07 NOTE — Assessment & Plan Note (Signed)
Suspect dehydration d/t chronic diarrhea  Pt BUN is trending up along with NA now at 152.  Suspect not able to keep up with losses from diarrhea.  Low grade fevers and tachycardia may be d/t dehydration  Recent BC and UC were unrevealing. WBC remains normal .   Spoke with mother regarding results of test, have recommended that pt will need further evaluation and IV fluids , will need to go to ER.  She requests records be sent to PCP as well.  Records sent.

## 2012-11-07 NOTE — ED Provider Notes (Signed)
CSN: [id number]     Arrival date & time 11/07/12  1650 History  This chart was scribed for Jeffery [name] B. Bernette Mayers, MD by Jeffery [name], ED Scribe. This patient was seen in room MH04/MH04 and the patient's care was started at 6:11 PM.    Chief Complaint  Patient presents with  . Diarrhea   The history is provided by a parent (mother). No language interpreter was used.   HPI Comments: Jeffery [name] is a 21 y.o. Male with a history of cerebral palsy who presents to the Emergency Department complaining of severe diarrhea onset 7 weeks ago. His mother reports that he had been on IV Abx for aspiration pneumonia prior to symptom onset and that he always gets diarrhea after receiving IV antibiotics. He is followed by Dr. Randa [name] with GI for this. He has also had cough ongoing for several weeks. Followed by Jeffery [name] for this.  His mother reports that he received a chest x-ray today  that was negative and did not indicate pneumonia started on Prednisone last night, had some other medication adjustment. His mother reports that he received a rectal biopsy 1 week ago that was negative. His mother reports that he has an appointment with Dr. Randa [name] tomorrow morning. Patient was sent here from [facility] office after labs showed mild increase in BUN concerning for dehydration.   Past Medical History  Diagnosis Date  . CP (cerebral palsy), spastic, quadriplegic   . Epilepsy   . Osteoporosis   . Inguinal hernia   . Undescended testes   . Seasonal allergies   . IVH (intraventricular hemorrhage)     Grade IV  . Hip dislocation, bilateral   . Seizures   . Hx: UTI (urinary tract infection)   . Dysphagia   . Otitis media   . Retinopathy of prematurity   . 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 Neuromuscular scoliosis   . Osteoporosis   . Complex partial seizures   . Generalized convulsive epilepsy without mention of intractable epilepsy   . Epilepsy   . Sinus bradycardia     HR drops to 38-40 while sleeping   . Sleep apnea     BiPAP  . Blister of right heel     fluid filled; origin unknown  . Kidney stones     ?  Marland Kitchen Pneumonia      chronic pneumonia ,respitory failure dx Augest 2014   Past Surgical History  Procedure Laterality Date  . Excision of moles    . Peg placement      With multiple changes  . Gastrostomy tube, change / reposition  12/15/2010       . Button change  12/15/2010    Procedure: BUTTON CHANGE;  Surgeon: Jeffery Boop, MD;  Location: [facility];  Service: Endoscopy;  Laterality: N/A;  . Peg placement  10/07/2011    Procedure: PERCUTANEOUS ENDOSCOPIC GASTROSTOMY (PEG) REPLACEMENT;  Surgeon: Jeffery Carwin, MD;  Location: [facility];  Service: Endoscopy;  Laterality: N/A;  Needs 18 F 2.5 button ordered-dl  . Inguinal hernia repair Bilateral 1992  . Retinopathy of prematurity surgery  1992  . Achilles tendon lengthening  12/1998  . Soft tissue releases  wrists and fingers  12/1998  . Intrathecal baclofen pump placement  07/25/2000  . Peg placement N/A 06/05/2012    Procedure: PERCUTANEOUS ENDOSCOPIC GASTROSTOMY (PEG) REPLACEMENT;  Surgeon: Jeffery Carwin, MD;  Location: [facility];  Service: Endoscopy;  Laterality: N/A;  button 17fr.2.5cm  . Eye  surgery Bilateral     Retinal   . Back surgery      Harrington Rods in back needs to be log rolled  . Tendon repair Bilateral 09/05/2012    Procedure: LENGTHENING OF DIGITAL FLEXOR TENDONS BILTERAL HANDS;  Surgeon: Jeffery Marble, MD;  Location: [facility] OR;  Service: Orthopedics;  Laterality: Bilateral;  . Hand surgery Bilateral Aug. 2014  . Peg placement N/A 09/13/2012    Procedure: PERCUTANEOUS ENDOSCOPIC GASTROSTOMY (PEG) REPLACEMENT;  Surgeon: Jeffery Carwin, MD;  Location: [facility];  Service: Endoscopy;  Laterality: N/A;  . Flexible sigmoidoscopy N/A 10/30/2012    Procedure: FLEXIBLE SIGMOIDOSCOPY;  Surgeon: Jeffery Reasons, MD;  Location: [facility];  Service: Endoscopy;  Laterality: N/A;   Family History  Problem  Relation Age of Onset  . Adopted: Yes   History  Substance Use Topics  . Smoking status: Never Smoker   . Smokeless tobacco: Never Used  . Alcohol Use: No    Review of Systems  A complete 10 system review of systems was obtained and all systems are negative except as noted in the HPI and PMH.   Allergies  Adhesive and Depakote  Home Medications   Current Outpatient Rx  Name  Route  Sig  Dispense  Refill  . albuterol (PROVENTIL HFA;VENTOLIN HFA) 108 (90 BASE) MCG/ACT inhaler   Inhalation   Inhale 2 puffs into the lungs every 4 (four) hours as needed for shortness of breath.         Marland Kitchen albuterol (PROVENTIL) (2.5 MG/3ML) 0.083% nebulizer solution   Nebulization   Take 3 mLs (2.5 mg total) by nebulization 2 (two) times daily. And as needed.  Dx: 507.0   300 mL   5   . amoxicillin-clavulanate (AUGMENTIN) 250-62.5 MG/5ML suspension   Oral   Take 250 mg by mouth 2 (two) times daily. 10 ml per feeding tube two times daily. Take for seven days         . Ascorbic Acid (VITAMIN C) 500 MG/5ML LIQD   PEG Tube   500 mg by PEG Tube route at bedtime.          . baclofen (GABLOFEN) 40000 MCG/20ML SOLN   Intrathecal   326 mcg by Intrathecal route continuous.          . calcium carbonate, dosed in mg elemental calcium, 1250 MG/5ML   PEG Tube   1,000 mg of elemental calcium by PEG Tube route 2 (two) times daily. 4 cc via peg tube         . Diazepam (DIASTAT RE)   Rectal   Place 12.5 mg rectally as needed (grand mal seizure lasting >5 min or compromising).          . ENSURE PLUS (ENSURE PLUS) LIQD   Oral   Take 237 mLs by mouth 2 (two) times daily between meals.         . feeding supplement (PRO-STAT SUGAR FREE 64) LIQD   Per Tube   Place 30 mLs into feeding tube 2 (two) times daily.   900 mL   0   . furosemide (LASIX) 10 MG/ML solution      20mg  by PEG Tube daily as needed         . lansoprazole (PREVACID SOLUTAB) 30 MG disintegrating tablet      daily.  Increase to 30 mg BID until PEG replacement         . levETIRAcetam (KEPPRA) 100 MG/ML solution   PEG Tube  1,750 mg by PEG Tube route 2 (two) times daily. 17.5 mls         . loperamide (IMODIUM A-D) 2 MG tablet   Oral   Take 2 mg by mouth 4 (four) times daily as needed for diarrhea or loose stools.         . Multiple Vitamin (MULTIVITAMIN) LIQD   PEG Tube   10 mLs by PEG Tube route daily.         . Multiple Vitamins-Minerals (ZINC PO)   PEG Tube   5 mLs by PEG Tube route daily. 244 mg / 5 mL zinc solution         . mupirocin ointment (BACTROBAN) 2 %   Topical   Apply 1 application topically as needed (to G-T site).   22 g   1   . NEURONTIN 250 MG/5ML solution      Give 16 mL by mouth 3 times daily   1632 mL   2     Dispense as written.    Brand Name Medically Necessary   . nystatin ointment (MYCOSTATIN)   Topical   Apply 1 application topically as needed.         . OnabotulinumtoxinA (BOTOX IM)   Intramuscular   Inject 1 mL into the muscle every 3 (three) months. Last taken on 10-11-12         . OXYGEN-HELIUM IN   Inhalation   Inhale into the lungs as needed.         . polyethylene glycol (MIRALAX / GLYCOLAX) packet   PEG Tube   17 g by PEG Tube route daily as needed (Constipation).          . predniSONE (DELTASONE) 10 MG tablet      Take 4 for three days 3 for three days 2 for three days 1 for three days and stop   30 tablet   0   . saccharomyces boulardii (FLORASTOR) 250 MG capsule   Oral   Take 250 mg by mouth 2 (two) times daily.         Marland Kitchen trypsin-balsam-castor oil (XENADERM) ointment   Topical   Apply 1 application topically daily as needed for wound care.          Triage Vitals: BP 106/59  Pulse 101  Resp 20  Wt 116 lb (52.617 kg)  BMI 23.05 kg/m2  SpO2 90%  Physical Exam  Nursing note and vitals reviewed. Constitutional:  Thin, multiple extremity contractures at baseline  HENT:  Head: Atraumatic.  Dry mouth   Eyes: Pupils are equal, round, and reactive to light.  Neck: No tracheal deviation present.  Cardiovascular: Normal rate, regular rhythm, normal heart sounds and intact distal pulses.   Pulmonary/Chest: Effort normal. No respiratory distress.  Coarse airway noise  Abdominal: Soft. He exhibits no distension. There is no tenderness.  PEG tube in place  Musculoskeletal: He exhibits no edema and no tenderness.  Neurological: He is alert.  Difficult to assess, moves all extremities  Skin: Skin is warm and dry. No rash noted.  Psychiatric:  Unable to assess    ED Course  Procedures (including critical care time)  Medications  sodium chloride 0.9 % bolus 1,000 mL (1,000 mLs Intravenous New Bag/Given 11/07/12 1806)   DIAGNOSTIC STUDIES: Oxygen Saturation is 90% on room air, adequate by my interpretation.    COORDINATION OF CARE: 6:17PM- Mother satisfied that his chronic medical issues are being addressed adequately in the outpatient setting. Does not  think he needs any additional ED workup for the diarrhea or the cough and would just like IVF to address dehydration from fluid loss.  Advised mother she may administer any of his regular medications at his normal time while they are here. . Discussed treatment plan with patient and parent at bedside and parent verbalized agreement on the patient's behalf.    Labs Review Labs Reviewed - No data to display  Imaging Review Dg Chest 1 View  Done at [facility]  11/07/2012   *RADIOLOGY REPORT*  Clinical Data: Cough, fever, history of chronic pneumonia, cerebral palsy and neuromuscular scoliosis, subsequent encounter.  CHEST - 1 VIEW  Comparison: 11/04/2012; 10/05/2012; 09/22/2012; 09/05/2012  Findings: Grossly unchanged cardiac silhouette and mediastinal contours gave an obliquity.  No new focal airspace opacity.  No definite pleural effusion or pneumothorax.  No definite evidence of edema.  Grossly unchanged bones including sequela of long  segment paraspinal thoracic fusion.  IMPRESSION: Degraded examination without definite acute cardiopulmonary disease.  Specifically, no definite evidence of pneumonia.   Original Report Authenticated By: Tacey Ruiz, MD    MDM   1. Diarrhea   2. Dehydration     Pt given IVF, remains non-toxic appearing. Mother comfortable with continued outpatient evaluation. She has requested a copy of today's labs from [facility] office which I provided to her.   I personally performed the services described in this documentation, which was scribed in my presence. The recorded information has been reviewed and is accurate.      Darlynn Ricco B. Bernette Mayers, MD 11/07/12 1945

## 2012-11-07 NOTE — ED Notes (Signed)
Assisted mom with cleaning patient prior to d/c, pt rolled to side, soiled stool diaper removed, pt cleansed with wipes that mom brought with her. Pt had small reddened area to right buttocks, balmex applied per mom's request, pt was cooperative, myself and morgan assisted patient to chair via moms instructions. Then assisted mom in strapping patient into wheelchair. Pt smiling and cooperative at d/c. Discharge instructions reviewed with mom and copy of labs provided at md request.

## 2012-11-07 NOTE — Telephone Encounter (Signed)
I agree with your assessment and recommendations.  He needs to be hydrated.  It unlikely that he will have a seizure, but it is an indication of his illness.

## 2012-11-07 NOTE — Telephone Encounter (Signed)
I spoke with the pt mother and she states that all they need are the forms that have been given to [place]. I spoke with Shanda Bumps and she states she has the forms and will give them to TP to complete. Carron Curie, CMA

## 2012-11-07 NOTE — Telephone Encounter (Signed)
Mom [name] left message and said that Jeffery [name] has a temp today of 101. He was seen by pulmonary and had labs were drawn this AM. Mom says that the results were all abnormal. She said that his sodium level was 161mcg/ml and she worries that will cause him to have a seizure. I called Mom back and explained that elevated sodium levels usually indicate dehydration, and that this level of hypernatremia alone would be unlikely to trigger seizures. I explained that the fact that he has an illness will lower seizure threshold. Mom said that she had been told that he was dehydrated and that was being addressed. I encouraged her to call back if Jeffery [name] has seizures or if she has more concerns. TG

## 2012-11-07 NOTE — ED Notes (Signed)
Mother reports diarrhea x 7 weeks sent here from PMD for dehydration

## 2012-11-07 NOTE — Assessment & Plan Note (Signed)
Hx of Aspiration PNA in 09/2012 w/ resolution on xray  No vomiting or known G tube malfunction .  CXR today in office w/ no evidence of PNA .  Labs point to possible dehydration -given persistent diarrhea this is a strong possibility . I have spoken with mother regarding results of labs and recommended will need further evaluation and probable IV fluids  Also consider UTI  As possibility -although WBC was norm today and recent Urine cx 3 days was neg.

## 2012-11-07 NOTE — Telephone Encounter (Signed)
lmomtcb x1 for Jeffery [name]

## 2012-11-07 NOTE — Progress Notes (Signed)
Subjective:    Patient ID: Jeffery [name], male    DOB: 08-Jul-1990, 22 y.o.   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 This patient had disruption of his feeding tube which resulted in large volume aspiration and hypoxic respiratory failure, on 09/15/2012. The patient was admitted to the ICU and required bronchoscopy to remove aspirated material from the right lung. There is complete atelectasis and marked opacification of the right hemithorax The patient is on bilevel at night with an associated fiber best. The patient formerly was with the pediatric pulmonologist and now the family wishes to establish in this practice. The patient is nonverbal but communicates with facial expressions. The patient is not take much by mouth and is using the G-tube for feedings. Patient does have a vibratory vest and utilizes that.  The patient is here today to establish Since home is better.  Goes to a day program usually Still on oxygen previously.     Receptive to interactions with facial communications.  No verbal.  Uses a computer with a switch.  No fever.  PEG is used for all means.    Pt has previous PNAs >>flu, PNA and TDAP vaccinex given   11/07/2012 Acute OV  Pt presents to office today with parents.  Mother informs me that his fever up to 101.1 this morning @ 0730.  began prednisone yesterday PM.  prod cough with thick whitish-yellow mucus last evening.  pt is fasting this morning.  pt mother reports "he is just not himself" Pt has hx of cerebral palsy was admitted recently in August with aspiration PNA. He was seen last month for follow up cxr with resolution of PNA.  Over last 3 days mom says pt has been more lethargic with tachycardia, congestion despite vibra vest. And has run low grade fevers with tmax this am at 101.  He was started on Augmentin last week . Pt has also been dealing with ongoing GI  issues, with G-tube and chronic diarrhea. He is followed by Dr. Randa Evens in GI. Mother reports C . Diff x 3 were neg. However diarrhea persists.  Phone message yesterday with recs to stop Augmentin , vitamins and begin florastor . He was taken off the vibra vest and started on Prednisone taper.  Mother denies that patient has had any vomiting, bloody stools, leg swelling, or rash. mother and father are primary caregivers  Was seen in ER on 11/04/12 with neg cxr , ngtd BC  And nml wbc. Labs did show BUN tr up.    Past Medical History  Diagnosis Date  . CP (cerebral palsy), spastic, quadriplegic   . Epilepsy   . Osteoporosis   . Inguinal hernia   . Undescended testes   . Seasonal allergies   . IVH (intraventricular hemorrhage)     Grade IV  . Hip dislocation, bilateral   . Seizures   . Hx: UTI (urinary tract infection)   . Dysphagia   . Otitis media   . Retinopathy of prematurity   . 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 Neuromuscular scoliosis   . Osteoporosis   . Complex partial seizures   . Generalized convulsive epilepsy without mention of intractable epilepsy   . Epilepsy   . Sinus bradycardia     HR drops to 38-40 while sleeping  . Sleep apnea     BiPAP  . Blister of right  heel     fluid filled; origin unknown  . Kidney stones     ?  Marland Kitchen Pneumonia      chronic pneumonia ,respitory failure dx Augest 2014     Family History  Problem Relation Age of Onset  . Adopted: Yes     History   Social History  . Marital Status: Single    Spouse Name: N/A    Number of Children: N/A  . Years of Education: N/A   Occupational History  . Student     Social History Main Topics  . Smoking status: Never Smoker   . Smokeless tobacco: Never Used  . Alcohol Use: No  . Drug Use: No  . Sexual Activity: No   Other Topics Concern  . Not on file   Social History Narrative  . No narrative on file     Allergies  Allergen Reactions  . Adhesive [Tape]     Rips skin off   . Depakote [Divalproex Sodium]     Causes pancreatitis      Outpatient Prescriptions Prior to Visit  Medication Sig Dispense Refill  . albuterol (PROVENTIL HFA;VENTOLIN HFA) 108 (90 BASE) MCG/ACT inhaler Inhale 2 puffs into the lungs every 4 (four) hours as needed for shortness of breath.      Marland Kitchen albuterol (PROVENTIL) (2.5 MG/3ML) 0.083% nebulizer solution Take 3 mLs (2.5 mg total) by nebulization 2 (two) times daily. And as needed.  Dx: 507.0  300 mL  5  . Ascorbic Acid (VITAMIN C) 500 MG/5ML LIQD 500 mg by PEG Tube route at bedtime.       . baclofen (GABLOFEN) 40000 MCG/20ML SOLN 326 mcg by Intrathecal route continuous.       . Diazepam (DIASTAT RE) Place 12.5 mg rectally as needed (grand mal seizure lasting >5 min or compromising).       . ENSURE PLUS (ENSURE PLUS) LIQD Take 237 mLs by mouth 2 (two) times daily between meals.      . feeding supplement (PRO-STAT SUGAR FREE 64) LIQD Place 30 mLs into feeding tube 2 (two) times daily.  900 mL  0  . levETIRAcetam (KEPPRA) 100 MG/ML solution 1,750 mg by PEG Tube route 2 (two) times daily. 17.5 mls      . loperamide (IMODIUM A-D) 2 MG tablet Take 2 mg by mouth 4 (four) times daily as needed for diarrhea or loose stools.      . Multiple Vitamin (MULTIVITAMIN) LIQD 10 mLs by PEG Tube route daily.      . Multiple Vitamins-Minerals (ZINC PO) 5 mLs by PEG Tube route daily. 244 mg / 5 mL zinc solution      . mupirocin ointment (BACTROBAN) 2 % Apply 1 application topically as needed (to G-T site).  22 g  1  . NEURONTIN 250 MG/5ML solution Give 16 mL by mouth 3 times daily  1632 mL  2  . OnabotulinumtoxinA (BOTOX IM) Inject 1 mL into the muscle every 3 (three) months. Last taken on 10-11-12      . OXYGEN-HELIUM IN Inhale into the lungs as needed.      . predniSONE (DELTASONE) 10 MG tablet Take 4 for three days 3 for three days 2 for three days 1 for three days and stop  30 tablet  0  . saccharomyces boulardii (FLORASTOR) 250 MG capsule Take 250 mg by  mouth 2 (two) times daily.      Marland Kitchen trypsin-balsam-castor oil (XENADERM) ointment Apply 1 application topically daily as needed  for wound care.      Marland Kitchen amoxicillin-clavulanate (AUGMENTIN) 250-62.5 MG/5ML suspension Take 250 mg by mouth 2 (two) times daily. 10 ml per feeding tube two times daily. Take for seven days      . calcium carbonate, dosed in mg elemental calcium, 1250 MG/5ML 1,000 mg of elemental calcium by PEG Tube route 2 (two) times daily. 4 cc via peg tube      . lansoprazole (PREVACID SOLUTAB) 30 MG disintegrating tablet daily. Increase to 30 mg BID until PEG replacement      . polyethylene glycol (MIRALAX / GLYCOLAX) packet 17 g by PEG Tube route daily as needed (Constipation).       . furosemide (LASIX) 20 MG tablet 20 mg by PEG Tube route daily as needed for fluid or edema.        Facility-Administered Medications Prior to Visit  Medication Dose Route Frequency Provider Last Rate Last Dose  . botulinum toxin Type A (BOTOX) injection 300 Units  300 Units Intramuscular Once Levert Feinstein, MD          Review of Systems  Review of systems taken in detail and is positive as above otherwise negative    Objective:   Physical Exam  Filed Vitals:   11/07/12 1046  BP: 134/84  Pulse: 115  Temp: 96.8 F (36 C)  TempSrc: Oral  SpO2: 93%    Gen: Severe cerebral palsy male sitting in motorized wheelchair with head rest and facial strap.   ENT: No lesions,  mouth clear,  oropharynx clear, no postnasal drip  Neck: No JVD, no TMG, no carotid bruits  Lungs: No use of accessory muscles, no dullness to percussion, no wheezing noted.   Cardiovascular: RRR, heart sounds normal, no murmur or gallops, no peripheral edema  Abdomen: soft and NT, no HSM,  BS normal  G-tube in place  Musculoskeletal: Contraction deformities and atrophy both lower extremities and upper extremitites   Neuro: alert, non focal, the patient is nonverbal but is interactive with facial expressions  Skin: Warm,  no lesions or rashes     Recent Labs Lab 11/04/12 1607 11/07/12 1116  HGB 18.6* 16.7  HCT 53.8* 49.9  WBC 8.4 9.4  PLT 166 166.0     Recent Labs Lab 11/04/12 1607 11/07/12 1116  NA 144 152*  K 4.3 3.8  CL 105 113*  CO2 22 28  GLUCOSE 107* 121*  BUN 26* 35*  CREATININE 0.62 0.8  CALCIUM 9.7 9.4     Dg Chest 1 View  11/07/2012   *RADIOLOGY REPORT*  Clinical Data: Cough, fever, history of chronic pneumonia, cerebral palsy and neuromuscular scoliosis, subsequent encounter.  CHEST - 1 VIEW  Comparison: 11/04/2012; 10/05/2012; 09/22/2012; 09/05/2012  Findings: Grossly unchanged cardiac silhouette and mediastinal contours gave an obliquity.  No new focal airspace opacity.  No definite pleural effusion or pneumothorax.  No definite evidence of edema.  Grossly unchanged bones including sequela of long segment paraspinal thoracic fusion.  IMPRESSION: Degraded examination without definite acute cardiopulmonary disease.  Specifically, no definite evidence of pneumonia.   Original Report Authenticated By: Tacey Ruiz, MD        Assessment & Plan:

## 2012-11-07 NOTE — Telephone Encounter (Signed)
I spoke with pt mother. She scheduled him an appt to see TP this AM in [place] office

## 2012-11-07 NOTE — Telephone Encounter (Signed)
[name] faxed to [place] a 2 pages-needs this completed asap.  Jeanice Lim has forms & will give to [place].  Antionette Fairy

## 2012-11-07 NOTE — Telephone Encounter (Signed)
Form filled out and signed by TP Faxed to the number on the fax cover sheet @ [phone] Sent to scan Nothing further needed at this time

## 2012-11-07 NOTE — ED Notes (Signed)
Mom at bedside states that it is time for his tube feeding and his neurontin she has both with her asks if ok to give both to patient. Spoke to Dr Bernette Mayers who states is ok for mother to do above. Iv infusing without difficulty.

## 2012-11-07 NOTE — Patient Instructions (Addendum)
Finish Prednsione taper .  Remain off Antibitoics .  I will call with xray and lab results.  Get xray at [facility] , sorry for the inconvenience .  follow up Dr. Delford Field  Next week as planned and As needed   Please contact office for sooner follow up if symptoms do not improve or worsen or seek emergency care

## 2012-11-07 NOTE — Telephone Encounter (Signed)
Called back & told me to tell Shanda Bumps that she is [name]'s Mom & that Shanda Bumps will talk to her.  Antionette Fairy

## 2012-11-07 NOTE — Telephone Encounter (Signed)
Pt's mom returning call can be reached [phone].Raylene Everts

## 2012-11-08 ENCOUNTER — Emergency Department (HOSPITAL_BASED_OUTPATIENT_CLINIC_OR_DEPARTMENT_OTHER)
Admission: EM | Admit: 2012-11-08 | Discharge: 2012-11-08 | Disposition: A | Discharge status: Home / Self Care | Payer: Medicaid Other | Attending: Emergency Medicine | Admitting: Emergency Medicine

## 2012-11-08 ENCOUNTER — Encounter (HOSPITAL_BASED_OUTPATIENT_CLINIC_OR_DEPARTMENT_OTHER): Payer: Self-pay | Admitting: Emergency Medicine

## 2012-11-08 ENCOUNTER — Telehealth: Payer: Self-pay | Admitting: Adult Health

## 2012-11-08 DIAGNOSIS — Z8669 Personal history of other diseases of the nervous system and sense organs: Secondary | ICD-10-CM

## 2012-11-08 DIAGNOSIS — G40209 Localization-related (focal) (partial) symptomatic epilepsy and epileptic syndromes with complex partial seizures, not intractable, without status epilepticus: Secondary | ICD-10-CM | POA: Insufficient documentation

## 2012-11-08 DIAGNOSIS — G4730 Sleep apnea, unspecified: Secondary | ICD-10-CM | POA: Insufficient documentation

## 2012-11-08 DIAGNOSIS — Z87442 Personal history of urinary calculi: Secondary | ICD-10-CM | POA: Insufficient documentation

## 2012-11-08 DIAGNOSIS — Z8739 Personal history of other diseases of the musculoskeletal system and connective tissue: Secondary | ICD-10-CM | POA: Insufficient documentation

## 2012-11-08 DIAGNOSIS — Z87440 Personal history of urinary (tract) infections: Secondary | ICD-10-CM | POA: Insufficient documentation

## 2012-11-08 DIAGNOSIS — Z8701 Personal history of pneumonia (recurrent): Secondary | ICD-10-CM | POA: Insufficient documentation

## 2012-11-08 DIAGNOSIS — Z79899 Other long term (current) drug therapy: Secondary | ICD-10-CM | POA: Insufficient documentation

## 2012-11-08 DIAGNOSIS — R509 Fever, unspecified: Secondary | ICD-10-CM | POA: Insufficient documentation

## 2012-11-08 DIAGNOSIS — E860 Dehydration: Secondary | ICD-10-CM | POA: Insufficient documentation

## 2012-11-08 DIAGNOSIS — Z8679 Personal history of other diseases of the circulatory system: Secondary | ICD-10-CM | POA: Insufficient documentation

## 2012-11-08 DIAGNOSIS — Z8719 Personal history of other diseases of the digestive system: Secondary | ICD-10-CM | POA: Insufficient documentation

## 2012-11-08 LAB — COMPREHENSIVE METABOLIC PANEL
CO2: 29 mEq/L (ref 19–32)
Calcium: 9.6 mg/dL (ref 8.4–10.5)
Chloride: 112 mEq/L (ref 96–112)
Creatinine, Ser: 0.6 mg/dL (ref 0.50–1.35)
GFR calc Af Amer: >90 mL/min (ref >90)
GFR calc non Af Amer: >90 mL/min (ref >90)
Glucose, Bld: 143 mg/dL — ABNORMAL HIGH (ref 70–99)
Total Bilirubin: 0.5 mg/dL (ref 0.3–1.2)

## 2012-11-08 LAB — CBC WITH DIFFERENTIAL/PLATELET
Basophils Absolute: 0 10*3/uL (ref 0.0–0.1)
Eosinophils Absolute: 0 10*3/uL (ref 0.0–0.7)
Eosinophils Relative: 0 % (ref 0–5)
HCT: 48.3 % (ref 39.0–52.0)
Hemoglobin: 15.1 g/dL (ref 13.0–17.0)
Lymphocytes Relative: 18 % (ref 12–46)
Lymphs Abs: 1.3 10*3/uL (ref 0.7–4.0)
MCH: 32.8 pg (ref 26.0–34.0)
MCV: 105 fL — ABNORMAL HIGH (ref 78.0–100.0)
Monocytes Absolute: 0.6 10*3/uL (ref 0.1–1.0)
Monocytes Relative: 8 % (ref 3–12)
Platelets: 141 10*3/uL — ABNORMAL LOW (ref 150–400)
RBC: 4.6 MIL/uL (ref 4.22–5.81)
RDW: 13.3 % (ref 11.5–15.5)
WBC: 7.3 10*3/uL (ref 4.0–10.5)

## 2012-11-08 MED ORDER — SODIUM CHLORIDE 0.9 % IV BOLUS (SEPSIS)
1000.0000 mL | Freq: Once | INTRAVENOUS | Status: AC
  Administered 2012-11-08: 1000 mL via INTRAVENOUS

## 2012-11-08 NOTE — ED Provider Notes (Signed)
CSN: [id number]     Arrival date & time 11/08/12  1119 History   First MD Initiated Contact with Patient 11/08/12 1250     Chief Complaint  Patient presents with  . iv fluids    (Consider location/radiation/quality/duration/timing/severity/associated sxs/prior Treatment) HPI Comments: Patient is a 22 year old male with past medical history significant for cerebral palsy. He has had a several week history of diarrhea for which she has been followed by Dr. Randa Evens from GI. He's been here receiving IV fluids on several occasions mom is concerned he is dehydrated.  She states he has been running intermittent fevers multiple C. difficile tests have been negative. Chest x-ray last night did not reveal pneumonia and recent urinalysis and urine culture were negative. Mom states that he had a fever earlier this morning.  Patient has history of cerebral palsy and is nonverbal. History is taken from the mother who is present at bedside.    The history is provided by the patient.    Past Medical History  Diagnosis Date  . CP (cerebral palsy), spastic, quadriplegic   . Epilepsy   . Osteoporosis   . Inguinal hernia   . Undescended testes   . Seasonal allergies   . IVH (intraventricular hemorrhage)     Grade IV  . Hip dislocation, bilateral   . Seizures   . Hx: UTI (urinary tract infection)   . Dysphagia   . Otitis media   . Retinopathy of prematurity   . 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 Neuromuscular scoliosis   . Osteoporosis   . Complex partial seizures   . Generalized convulsive epilepsy without mention of intractable epilepsy   . Epilepsy   . Sinus bradycardia     HR drops to 38-40 while sleeping  . Sleep apnea     BiPAP  . Blister of right heel     fluid filled; origin unknown  . Kidney stones     ?  Marland Kitchen Pneumonia      chronic pneumonia ,respitory failure dx Augest 2014   Past Surgical History  Procedure Laterality Date  . Excision of moles    . Peg placement       With multiple changes  . Gastrostomy tube, change / reposition  12/15/2010       . Button change  12/15/2010    Procedure: BUTTON CHANGE;  Surgeon: Iva Boop, MD;  Location: [facility];  Service: Endoscopy;  Laterality: N/A;  . Peg placement  10/07/2011    Procedure: PERCUTANEOUS ENDOSCOPIC GASTROSTOMY (PEG) REPLACEMENT;  Surgeon: Hart Carwin, MD;  Location: [facility];  Service: Endoscopy;  Laterality: N/A;  Needs 18 F 2.5 button ordered-dl  . Inguinal hernia repair Bilateral 1992  . Retinopathy of prematurity surgery  1992  . Achilles tendon lengthening  12/1998  . Soft tissue releases  wrists and fingers  12/1998  . Intrathecal baclofen pump placement  07/25/2000  . Peg placement N/A 06/05/2012    Procedure: PERCUTANEOUS ENDOSCOPIC GASTROSTOMY (PEG) REPLACEMENT;  Surgeon: Hart Carwin, MD;  Location: [facility];  Service: Endoscopy;  Laterality: N/A;  button 1fr.2.5cm  . Eye surgery Bilateral     Retinal   . Back surgery      Harrington Rods in back needs to be log rolled  . Tendon repair Bilateral 09/05/2012    Procedure: LENGTHENING OF DIGITAL FLEXOR TENDONS BILTERAL HANDS;  Surgeon: Jodi Marble, MD;  Location: [facility];  Service: Orthopedics;  Laterality: Bilateral;  . Hand  surgery Bilateral Aug. 2014  . Peg placement N/A 09/13/2012    Procedure: PERCUTANEOUS ENDOSCOPIC GASTROSTOMY (PEG) REPLACEMENT;  Surgeon: Hart Carwin, MD;  Location: [facility];  Service: Endoscopy;  Laterality: N/A;  . Flexible sigmoidoscopy N/A 10/30/2012    Procedure: FLEXIBLE SIGMOIDOSCOPY;  Surgeon: Florencia Reasons, MD;  Location: [facility];  Service: Endoscopy;  Laterality: N/A;   Family History  Problem Relation Age of Onset  . Adopted: Yes   History  Substance Use Topics  . Smoking status: Never Smoker   . Smokeless tobacco: Never Used  . Alcohol Use: No    Review of Systems  Unable to perform ROS   Allergies  Adhesive and Depakote  Home Medications   Current Outpatient  Rx  Name  Route  Sig  Dispense  Refill  . albuterol (PROVENTIL HFA;VENTOLIN HFA) 108 (90 BASE) MCG/ACT inhaler   Inhalation   Inhale 2 puffs into the lungs every 4 (four) hours as needed for shortness of breath.         Marland Kitchen albuterol (PROVENTIL) (2.5 MG/3ML) 0.083% nebulizer solution   Nebulization   Take 3 mLs (2.5 mg total) by nebulization 2 (two) times daily. And as needed.  Dx: 507.0   300 mL   5   . amoxicillin-clavulanate (AUGMENTIN) 250-62.5 MG/5ML suspension   Oral   Take 250 mg by mouth 2 (two) times daily. 10 ml per feeding tube two times daily. Take for seven days         . Ascorbic Acid (VITAMIN C) 500 MG/5ML LIQD   PEG Tube   500 mg by PEG Tube route at bedtime.          . baclofen (GABLOFEN) 40000 MCG/20ML SOLN   Intrathecal   326 mcg by Intrathecal route continuous.          . calcium carbonate, dosed in mg elemental calcium, 1250 MG/5ML   PEG Tube   1,000 mg of elemental calcium by PEG Tube route 2 (two) times daily. 4 cc via peg tube         . Diazepam (DIASTAT RE)   Rectal   Place 12.5 mg rectally as needed (grand mal seizure lasting >5 min or compromising).          . ENSURE PLUS (ENSURE PLUS) LIQD   Oral   Take 237 mLs by mouth 2 (two) times daily between meals.         . feeding supplement (PRO-STAT SUGAR FREE 64) LIQD   Per Tube   Place 30 mLs into feeding tube 2 (two) times daily.   900 mL   0   . furosemide (LASIX) 10 MG/ML solution      20mg  by PEG Tube daily as needed         . lansoprazole (PREVACID SOLUTAB) 30 MG disintegrating tablet      daily. Increase to 30 mg BID until PEG replacement         . levETIRAcetam (KEPPRA) 100 MG/ML solution   PEG Tube   1,750 mg by PEG Tube route 2 (two) times daily. 17.5 mls         . loperamide (IMODIUM A-D) 2 MG tablet   Oral   Take 2 mg by mouth 4 (four) times daily as needed for diarrhea or loose stools.         . Multiple Vitamin (MULTIVITAMIN) LIQD   PEG Tube   10 mLs  by PEG Tube route daily.         Marland Kitchen  Multiple Vitamins-Minerals (ZINC PO)   PEG Tube   5 mLs by PEG Tube route daily. 244 mg / 5 mL zinc solution         . mupirocin ointment (BACTROBAN) 2 %   Topical   Apply 1 application topically as needed (to G-T site).   22 g   1   . NEURONTIN 250 MG/5ML solution      Give 16 mL by mouth 3 times daily   1632 mL   2     Dispense as written.    Brand Name Medically Necessary   . nystatin ointment (MYCOSTATIN)   Topical   Apply 1 application topically as needed.         . OnabotulinumtoxinA (BOTOX IM)   Intramuscular   Inject 1 mL into the muscle every 3 (three) months. Last taken on 10-11-12         . OXYGEN-HELIUM IN   Inhalation   Inhale into the lungs as needed.         . polyethylene glycol (MIRALAX / GLYCOLAX) packet   PEG Tube   17 g by PEG Tube route daily as needed (Constipation).          . predniSONE (DELTASONE) 10 MG tablet      Take 4 for three days 3 for three days 2 for three days 1 for three days and stop   30 tablet   0   . saccharomyces boulardii (FLORASTOR) 250 MG capsule   Oral   Take 250 mg by mouth 2 (two) times daily.         Marland Kitchen trypsin-balsam-castor oil (XENADERM) ointment   Topical   Apply 1 application topically daily as needed for wound care.          There were no vitals taken for this visit. Physical Exam  Nursing note and vitals reviewed. Constitutional:  The patient is a chronically ill and emaciated appearing 22 year old male  HENT:  Head: Normocephalic and atraumatic.  Mouth/Throat: Oropharynx is clear and moist.  Neck: Normal range of motion. Neck supple.  Cardiovascular: Normal rate, regular rhythm and normal heart sounds.   No murmur heard. Pulmonary/Chest: Effort normal and breath sounds normal. No respiratory distress. He has no wheezes.  Abdominal: Soft. Bowel sounds are normal.  Musculoskeletal:  Extremities are cachectic and atrophic.  Neurological: He is alert.   Skin: Skin is warm and dry.    ED Course  Procedures (including critical care time) Labs Review Labs Reviewed  CBC WITH DIFFERENTIAL - Abnormal; Notable for the following:    MCV 105.0 (*)    Platelets 141 (*)    All other components within normal limits  COMPREHENSIVE METABOLIC PANEL - Abnormal; Notable for the following:    Sodium 150 (*)    Glucose, Bld 143 (*)    BUN 25 (*)    All other components within normal limits   Imaging Review Dg Chest 1 View  11/07/2012   *RADIOLOGY REPORT*  Clinical Data: Cough, fever, history of chronic pneumonia, cerebral palsy and neuromuscular scoliosis, subsequent encounter.  CHEST - 1 VIEW  Comparison: 11/04/2012; 10/05/2012; 09/22/2012; 09/05/2012  Findings: Grossly unchanged cardiac silhouette and mediastinal contours gave an obliquity.  No new focal airspace opacity.  No definite pleural effusion or pneumothorax.  No definite evidence of edema.  Grossly unchanged bones including sequela of long segment paraspinal thoracic fusion.  IMPRESSION: Degraded examination without definite acute cardiopulmonary disease.  Specifically, no definite evidence of pneumonia.  Original Report Authenticated By: Tacey Ruiz, MD    MDM  No diagnosis found. Patient was brought by mother who is requesting additional IV fluids be given. He's been to the ER several times in the past week for the same. His electrolytes were suggestive of dehydration yesterday evening and today's results show some improvement. He was given an additional liter of normal saline and appears to be tolerating this well.  I spoke with Dr. Delford Field from pulmonology who is familiar with this patient. He was initially requesting the patient be admitted, however the mother does not want to do this. She feels as though he gets better care at home and feels as he is more comfortable there. He has been having some diarrhea at home however this seems to be subsiding somewhat.  He will be discharged to  home with as needed followup.    Jeffery Lyons, MD 11/08/12 [phone]

## 2012-11-08 NOTE — Telephone Encounter (Signed)
Noted and thank you. I later spoke to dr Judd Lien who does not feel pt needs admission and pts MOM also felt if he got one more liter of IVF he would be fine.  Pt to keep f/u appt next week

## 2012-11-08 NOTE — Telephone Encounter (Signed)
Called and spoke with the ED physician at [facility] (Dr. Theodis Shove) I advised that per PW- we feel that the pt needs to be admitted asap to [facility] ICU  I advised that he has been to the ED several times in the past few days and admit is the best option at this point  Dr. Theodis Shove verbalized understanding, and states that they are very busy and will get to him as soon as they can Will forward to PW so that he is aware

## 2012-11-08 NOTE — Telephone Encounter (Signed)
Someone needs to reach out to the mom.  I feel the pt probably needs direct admission to hospital Can we find out which ED he is located??  i am in [place] at 3pm.  He just needs to be admitted.Marland KitchenMarland KitchenMarland Kitchen  pw

## 2012-11-08 NOTE — ED Notes (Signed)
Pt returns today after seeing Dr Randa Evens mom feels he is still a bit dehydrated and would like to have labs rechecked and another bag of iv fluid is having a fever has seen dr Delford Field with pulmonary have ruled out respiratory infection. Thinks the fever is coming from dehydration had blood culture and urine cultures done Sat and 3 c diff negative

## 2012-11-08 NOTE — Telephone Encounter (Signed)
I  Spoke with pt mother. She stated pt went to the ED last night and had fluids. She stated she took pt back to ER and is there waiting now. She reports pt temp was 100.3 and felt he needed another bag of fluids and labs drawn. She called PCP and they refused to give him IV fluids/ draw labs so she then decided to take him back to the ED. She reports this is FYI for [place]. Will forward to Dr. Delford Field as an Lorain Childes

## 2012-11-08 NOTE — ED Notes (Signed)
Mother reports pt was seen in ED yesterday for dehydration and states he still seems dehydrated today.  She is requesting labs and IVF.

## 2012-11-08 NOTE — Telephone Encounter (Signed)
[email or web address] x1 for [employer]

## 2012-11-10 LAB — CULTURE, BLOOD (ROUTINE X 2)
Culture: NO GROWTH
Culture: NO GROWTH

## 2012-11-13 ENCOUNTER — Encounter: Payer: Self-pay | Admitting: Critical Care Medicine

## 2012-11-13 ENCOUNTER — Other Ambulatory Visit: Payer: Self-pay | Admitting: Gastroenterology

## 2012-11-13 ENCOUNTER — Ambulatory Visit (INDEPENDENT_AMBULATORY_CARE_PROVIDER_SITE_OTHER): Payer: Medicaid Other | Admitting: Critical Care Medicine

## 2012-11-13 VITALS — BP 116/84 | HR 96

## 2012-11-13 DIAGNOSIS — E860 Dehydration: Secondary | ICD-10-CM

## 2012-11-13 DIAGNOSIS — J9811 Atelectasis: Secondary | ICD-10-CM

## 2012-11-13 DIAGNOSIS — R197 Diarrhea, unspecified: Secondary | ICD-10-CM

## 2012-11-13 DIAGNOSIS — J9819 Other pulmonary collapse: Secondary | ICD-10-CM

## 2012-11-13 NOTE — Patient Instructions (Signed)
I am ok with the plan to advance meds as per Dr Reche Dixon. Resume the vibra vest Cont bipap and neb meds Agree with water via feeding tube Return 6 weeks

## 2012-11-13 NOTE — Addendum Note (Signed)
Addended byVida Rigger on: 11/13/2012 08:42 AM   Modules accepted: Orders

## 2012-11-13 NOTE — Progress Notes (Signed)
Subjective:    Patient ID: Jeffery [name], male    DOB: 1990/04/21, 22 y.o.   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 This patient had disruption of his feeding tube which resulted in large volume aspiration and hypoxic respiratory failure, on 22/15/2014. The patient was admitted to the ICU and required bronchoscopy to remove aspirated material from the right lung. There is complete atelectasis and marked opacification of the right hemithorax The patient is on bilevel at night with an associated fiber best. The patient formerly was with the pediatric pulmonologist and now the family wishes to establish in this practice. The patient is nonverbal but communicates with facial expressions. The patient is not take much by mouth and is using the G-tube for feedings. Patient does have a vibratory vest and utilizes that.  The patient is here today to establish Since home is better.  Goes to a day program usually Still on oxygen previously.     Receptive to interactions with facial communications.  No verbal.  Uses a computer with a switch.  No fever.  PEG is used for all means.    Pt has previous PNAs >>flu, PNA and TDAP vaccinex given   11/07/12  Acute OV  Pt presents to office today with parents.  Mother informs me that his fever up to 101.1 this morning @ 0730.  began prednisone yesterday PM.  prod cough with thick whitish-yellow mucus last evening.  pt is fasting this morning.  pt mother reports "he is just not himself" Pt has hx of cerebral palsy was admitted recently in August with aspiration PNA. He was seen last month for follow up cxr with resolution of PNA.  Over last 3 days mom says pt has been more lethargic with tachycardia, congestion despite vibra vest. And has run low grade fevers with tmax this am at 101.  He was started on Augmentin last week . Pt has also been dealing with ongoing GI  issues, with G-tube and chronic diarrhea. He is followed by Dr. Randa Evens in GI. Mother reports C . Diff x 3 were neg. However diarrhea persists.  Phone message yesterday with recs to stop Augmentin , vitamins and begin florastor . He was taken off the vibra vest and started on Prednisone taper.  Mother denies that patient has had any vomiting, bloody stools, leg swelling, or rash. mother and father are primary caregivers  Was seen in ER on 22/4/14 with neg cxr , ngtd BC  And nml wbc. Labs did show BUN tr up.   11/13/2012 Chief Complaint  Patient presents with  . Follow-up    Has "bronchial" spasm approx 1 per day over the past 2 days but handles it well.  Cough with clear mucus.  No fever x 2 days.  Pt went back to ED 22/8/14 after seen by NP on 11/07/12. More IVF given. Was not admitted. Diarrheal issues ongoing.  Note distal wall of rectum and sigmoid inflamed 09/2012 GI did a rectal bx 22/29/4 No CA or infection  Wall was normal No dx given as of yet.  Ensure is better than jevity Gets 4 cans of ensure per day.  Prostat for protein. Spasms only once per day, has to cough and works so much to get it up. Suctions in the mouth.  Not using vibravest Pt has less diarrhea,  Large and not massive.  Past Medical History  Diagnosis Date  . CP (cerebral palsy), spastic, quadriplegic   . Epilepsy   . Osteoporosis   . Inguinal hernia   . Undescended testes   . Seasonal allergies   . IVH (intraventricular hemorrhage)     Grade IV  . Hip dislocation, bilateral   . Seizures   . Hx: UTI (urinary tract infection)   . Dysphagia   . Otitis media   . Retinopathy of prematurity   . 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 Neuromuscular scoliosis   . Osteoporosis   . Complex partial seizures   . Generalized convulsive epilepsy without mention of intractable epilepsy   . Epilepsy   . Sinus bradycardia     HR drops to 38-40 while sleeping  . Sleep apnea     BiPAP  . Blister of right  heel     fluid filled; origin unknown  . Kidney stones     ?  Marland Kitchen Pneumonia      chronic pneumonia ,respitory failure dx Augest 2014     Family History  Problem Relation Age of Onset  . Adopted: Yes     History   Social History  . Marital Status: Single    Spouse Name: N/A    Number of Children: N/A  . Years of Education: N/A   Occupational History  . Student     Social History Main Topics  . Smoking status: Never Smoker   . Smokeless tobacco: Never Used  . Alcohol Use: No  . Drug Use: No  . Sexual Activity: No   Other Topics Concern  . Not on file   Social History Narrative  . No narrative on file     Allergies  Allergen Reactions  . Adhesive [Tape]     Rips skin off  . Depakote [Divalproex Sodium]     Causes pancreatitis      Outpatient Prescriptions Prior to Visit  Medication Sig Dispense Refill  . albuterol (PROVENTIL HFA;VENTOLIN HFA) 108 (90 BASE) MCG/ACT inhaler Inhale 2 puffs into the lungs every 4 (four) hours as needed for shortness of breath.      Marland Kitchen albuterol (PROVENTIL) (2.5 MG/3ML) 0.083% nebulizer solution Take 3 mLs (2.5 mg total) by nebulization 2 (two) times daily. And as needed.  Dx: 507.0  300 mL  5  . Ascorbic Acid (VITAMIN C) 500 MG/5ML LIQD 500 mg by PEG Tube route at bedtime.       . baclofen (GABLOFEN) 40000 MCG/20ML SOLN 326 mcg by Intrathecal route continuous.       . calcium carbonate, dosed in mg elemental calcium, 1250 MG/5ML 1,000 mg of elemental calcium by PEG Tube route 2 (two) times daily. 4 cc via peg tube      . Diazepam (DIASTAT RE) Place 12.5 mg rectally as needed (grand mal seizure lasting >5 min or compromising).       . ENSURE PLUS (ENSURE PLUS) LIQD Take 237 mLs by mouth 2 (two) times daily between meals.      . feeding supplement (PRO-STAT SUGAR FREE 64) LIQD Place 30 mLs into feeding tube 2 (two) times daily.  900 mL  0  . furosemide (LASIX) 10 MG/ML solution 20mg  by PEG Tube daily as needed      . lansoprazole  (PREVACID SOLUTAB) 30 MG disintegrating tablet daily. Increase to 30 mg BID until PEG replacement      . levETIRAcetam (KEPPRA) 100 MG/ML solution 1,750 mg by PEG Tube route 2 (  two) times daily. 17.5 mls      . loperamide (IMODIUM A-D) 2 MG tablet Take 2 mg by mouth 4 (four) times daily as needed for diarrhea or loose stools.      . Multiple Vitamin (MULTIVITAMIN) LIQD 10 mLs by PEG Tube route daily.      . Multiple Vitamins-Minerals (ZINC PO) 5 mLs by PEG Tube route daily. 244 mg / 5 mL zinc solution      . mupirocin ointment (BACTROBAN) 2 % Apply 1 application topically as needed (to G-T site).  22 g  1  . NEURONTIN 250 MG/5ML solution Give 16 mL by mouth 3 times daily  1632 mL  2  . OnabotulinumtoxinA (BOTOX IM) Inject 1 mL into the muscle every 3 (three) months. Last taken on 10-11-12      . OXYGEN-HELIUM IN Inhale into the lungs as needed.      . predniSONE (DELTASONE) 10 MG tablet Take 4 for three days 3 for three days 2 for three days 1 for three days and stop  30 tablet  0  . saccharomyces boulardii (FLORASTOR) 250 MG capsule Take 250 mg by mouth 2 (two) times daily.      Marland Kitchen amoxicillin-clavulanate (AUGMENTIN) 250-62.5 MG/5ML suspension Take 250 mg by mouth 2 (two) times daily. 10 ml per feeding tube two times daily. Take for seven days      . nystatin ointment (MYCOSTATIN) Apply 1 application topically as needed.      . polyethylene glycol (MIRALAX / GLYCOLAX) packet 17 g by PEG Tube route daily as needed (Constipation).       Marland Kitchen trypsin-balsam-castor oil (XENADERM) ointment Apply 1 application topically daily as needed for wound care.       Facility-Administered Medications Prior to Visit  Medication Dose Route Frequency Provider Last Rate Last Dose  . botulinum toxin Type A (BOTOX) injection 300 Units  300 Units Intramuscular Once Levert Feinstein, MD          Review of Systems  Review of systems taken in detail and is positive as above otherwise negative    Objective:   Physical  Exam  BP 116/84  Pulse 96  SpO2 96%  Gen: Severe cerebral palsy male sitting in motorized wheelchair with head rest and facial strap.   ENT: No lesions,  mouth clear,  oropharynx clear, no postnasal drip  Neck: No JVD, no TMG, no carotid bruits  Lungs: No use of accessory muscles, no dullness to percussion, no wheezing noted.   Cardiovascular: RRR, heart sounds normal, no murmur or gallops, no peripheral edema  Abdomen: soft and NT, no HSM,  BS normal  G-tube in place  Musculoskeletal: Contraction deformities and atrophy both lower extremities and upper extremitites   Neuro: alert, non focal, the patient is nonverbal but is interactive with facial expressions  Skin: Warm, no lesions or rashes     Recent Labs Lab 11/07/12 1116 11/08/12 1255  HGB 16.7 15.1  HCT 49.9 48.3  WBC 9.4 7.3  PLT 166.0 141*     Recent Labs Lab 11/07/12 1116 11/08/12 1255  NA 152* 150*  K 3.8 3.5  CL 113* 112  CO2 28 29  GLUCOSE 121* 143*  BUN 35* 25*  CREATININE 0.8 0.60  CALCIUM 9.4 9.6     No results found.      Assessment & Plan:   Dehydration Improved with reduction in diarrhea and ivf Plan I am ok with the plan to advance meds as per Dr  [name]. Resume the vibra vest Cont bipap and neb meds Agree with water via feeding tube Return 6 weeks   Atelectasis Atelectasis d/t low lung volumes with severe CP and restrictive lung disease Plan Cont curr care   Diarrhea Diarrhea being managed by GI and pcp Plan Per pcp   Updated Medication List Facility-Administered Encounter Medications as of 11/13/2012  Medication Dose Route Frequency Provider Last Rate Last Dose  . botulinum toxin Type A (BOTOX) injection 300 Units  300 Units Intramuscular Once Levert Feinstein, MD       Outpatient Encounter Prescriptions as of 11/13/2012  Medication Sig Dispense Refill  . albuterol (PROVENTIL HFA;VENTOLIN HFA) 108 (90 BASE) MCG/ACT inhaler Inhale 2 puffs into the lungs every 4 (four)  hours as needed for shortness of breath.      Marland Kitchen albuterol (PROVENTIL) (2.5 MG/3ML) 0.083% nebulizer solution Take 3 mLs (2.5 mg total) by nebulization 2 (two) times daily. And as needed.  Dx: 507.0  300 mL  5  . Ascorbic Acid (VITAMIN C) 500 MG/5ML LIQD 500 mg by PEG Tube route at bedtime.       . baclofen (GABLOFEN) 40000 MCG/20ML SOLN 326 mcg by Intrathecal route continuous.       . Cyanocobalamin (VITAMIN B-12 IJ) Inject as directed every 30 (thirty) days.      . ENSURE PLUS (ENSURE PLUS) LIQD Take 237 mLs by mouth 4 (four) times daily.       . feeding supplement (PRO-STAT SUGAR FREE 64) LIQD Place 30 mLs into feeding tube 2 (two) times daily.  900 mL  0  . folic acid (FOLVITE) 1 MG tablet Take 1 mg by mouth daily.      . furosemide (LASIX) 10 MG/ML solution 20mg  by PEG Tube daily as needed      . levETIRAcetam (KEPPRA) 100 MG/ML solution 1,750 mg by PEG Tube route 2 (two) times daily. 17.5 mls      . loperamide (IMODIUM A-D) 2 MG tablet Take 2 mg by mouth 4 (four) times daily as needed for diarrhea or loose stools.      . Multiple Vitamin (MULTIVITAMIN) LIQD 10 mLs by PEG Tube route daily.      . Multiple Vitamins-Minerals (ZINC PO) 5 mLs by PEG Tube route daily. 244 mg / 5 mL zinc solution      . mupirocin ointment (BACTROBAN) 2 % Apply 1 application topically as needed (to G-T site).  22 g  1  . NEURONTIN 250 MG/5ML solution Give 16 mL by mouth 3 times daily  1632 mL  2  . OnabotulinumtoxinA (BOTOX IM) Inject 1 mL into the muscle every 3 (three) months. Last taken on 10-11-12      . OXYGEN-HELIUM IN Inhale into the lungs as needed.      . predniSONE (DELTASONE) 10 MG tablet Take 4 for three days 3 for three days 2 for three days 1 for three days and stop  30 tablet  0  . saccharomyces boulardii (FLORASTOR) 250 MG capsule Take 500 mg by mouth 2 (two) times daily.       . calcium carbonate, dosed in mg elemental calcium, 1250 MG/5ML by PEG Tube route 2 (two) times daily. 4 cc via peg tube - ON  HOLD UNTIL 11/17/12 PER DR. [name]      . Diazepam (DIASTAT RE) Place 12.5 mg rectally as needed (grand mal seizure lasting >5 min or compromising).       . lansoprazole (PREVACID SOLUTAB) 30 MG disintegrating  tablet daily. Increase to 30 mg BID until PEG replacement - ON HOLD UNTIL 11/24/12 PER DR. [name]      . [DISCONTINUED] amoxicillin-clavulanate (AUGMENTIN) 250-62.5 MG/5ML suspension Take 250 mg by mouth 2 (two) times daily. 10 ml per feeding tube two times daily. Take for seven days      . [DISCONTINUED] nystatin ointment (MYCOSTATIN) Apply 1 application topically as needed.      . [DISCONTINUED] polyethylene glycol (MIRALAX / GLYCOLAX) packet 17 g by PEG Tube route daily as needed (Constipation).       . [DISCONTINUED] trypsin-balsam-castor oil (XENADERM) ointment Apply 1 application topically daily as needed for wound care.

## 2012-11-14 ENCOUNTER — Other Ambulatory Visit: Payer: Self-pay | Admitting: Gastroenterology

## 2012-11-14 NOTE — Addendum Note (Signed)
Addended byVida Rigger on: 11/14/2012 03:25 PM   Modules accepted: Orders

## 2012-11-15 ENCOUNTER — Ambulatory Visit (HOSPITAL_COMMUNITY): Admission: RE | Admit: 2012-11-15 | Payer: Medicaid Other | Source: Ambulatory Visit | Admitting: Gastroenterology

## 2012-11-15 ENCOUNTER — Encounter (HOSPITAL_COMMUNITY)
Admission: RE | Disposition: A | Discharge status: Home / Self Care | Payer: Medicaid Other | Source: Ambulatory Visit | Attending: Gastroenterology

## 2012-11-15 ENCOUNTER — Encounter (HOSPITAL_COMMUNITY): Payer: Self-pay | Admitting: *Deleted

## 2012-11-15 ENCOUNTER — Encounter (HOSPITAL_COMMUNITY): Admission: RE | Payer: Self-pay | Source: Ambulatory Visit

## 2012-11-15 ENCOUNTER — Ambulatory Visit (HOSPITAL_COMMUNITY)
Admission: RE | Admit: 2012-11-15 | Discharge: 2012-11-15 | Disposition: A | Discharge status: Home / Self Care | Payer: Medicaid Other | Source: Ambulatory Visit | Attending: Gastroenterology | Admitting: Gastroenterology

## 2012-11-15 DIAGNOSIS — R633 Feeding difficulties, unspecified: Secondary | ICD-10-CM | POA: Insufficient documentation

## 2012-11-15 DIAGNOSIS — Z431 Encounter for attention to gastrostomy: Secondary | ICD-10-CM | POA: Insufficient documentation

## 2012-11-15 DIAGNOSIS — R197 Diarrhea, unspecified: Secondary | ICD-10-CM | POA: Insufficient documentation

## 2012-11-15 HISTORY — PX: PEG PLACEMENT: SHX5437

## 2012-11-15 SURGERY — REPLACEMENT, PEG TUBE, WITHOUT ENDOSCOPY

## 2012-11-15 NOTE — Assessment & Plan Note (Signed)
Diarrhea being managed by GI and pcp Plan Per pcp

## 2012-11-15 NOTE — Assessment & Plan Note (Signed)
Improved with reduction in diarrhea and ivf Plan I am ok with the plan to advance meds as per Dr Reche Dixon. Resume the vibra vest Cont bipap and neb meds Agree with water via feeding tube Return 6 weeks

## 2012-11-15 NOTE — Assessment & Plan Note (Signed)
Atelectasis d/t low lung volumes with severe CP and restrictive lung disease Plan Cont curr care

## 2012-11-15 NOTE — Progress Notes (Signed)
PROCEDURE NOTE  11/15/2012  9:36 AM  PATIENT:  Jeffery [name]  22 y.o. male  PRE-OPERATIVE DIAGNOSIS:  Feeding problem  POST-OPERATIVE DIAGNOSIS: Feeding problem  PROCEDURE: Button change  SURGEON:  Surgeon(s): Petra Kuba, MD  ASSESSMENT/FINDINGS:  The patient's old PEG was removed in the customary fashion by removing the water from the balloon after the abdomen was sterilely dressed and the PEG was removed and after checking the buttons balloon the 24 French 3.5 cm replacement button was inserted with gentle pressure and the balloon was inflated with 9-1/2 cc of water and the patient tolerated the procedure well and the wound was dressed by the nurse and the family was showed how to care for it PLAN OF CARE: Customary post-button care and call us when necessary and possibly the mother would be interested in changing the button in the future herself and will look into getting additional buttons from her home health care

## 2012-11-16 ENCOUNTER — Encounter (HOSPITAL_COMMUNITY): Payer: Self-pay | Admitting: Gastroenterology

## 2012-11-17 ENCOUNTER — Telehealth: Payer: Self-pay | Admitting: *Deleted

## 2012-11-17 DIAGNOSIS — G40219 Localization-related (focal) (partial) symptomatic epilepsy and epileptic syndromes with complex partial seizures, intractable, without status epilepticus: Secondary | ICD-10-CM

## 2012-11-17 DIAGNOSIS — G40319 Generalized idiopathic epilepsy and epileptic syndromes, intractable, without status epilepticus: Secondary | ICD-10-CM

## 2012-11-17 MED ORDER — LEVETIRACETAM 100 MG/ML PO SOLN: ORAL | Status: DC

## 2012-11-17 NOTE — Telephone Encounter (Signed)
Rx sent to pharmacy [facility] 

## 2012-11-17 NOTE — Telephone Encounter (Signed)
11/17/12 9:37am - there was a Engineer, technical sales for [employer] from [employer] (name of pharmacy and person's name was not left).  They would like to know if the Keppra suspension can be filled giving 17.5 ml twice daily.  The pharmacy number is [phone].

## 2012-11-21 ENCOUNTER — Emergency Department (HOSPITAL_COMMUNITY): Payer: Medicaid Other

## 2012-11-21 ENCOUNTER — Encounter (HOSPITAL_COMMUNITY): Payer: Self-pay | Admitting: Emergency Medicine

## 2012-11-21 ENCOUNTER — Inpatient Hospital Stay (HOSPITAL_COMMUNITY)
Admission: EM | Admit: 2012-11-21 | Discharge: 2012-11-29 | DRG: 177 | Disposition: A | Discharge status: Home / Self Care | Payer: Medicaid Other | Attending: Internal Medicine | Admitting: Internal Medicine

## 2012-11-21 DIAGNOSIS — G4733 Obstructive sleep apnea (adult) (pediatric): Secondary | ICD-10-CM

## 2012-11-21 DIAGNOSIS — G40319 Generalized idiopathic epilepsy and epileptic syndromes, intractable, without status epilepticus: Secondary | ICD-10-CM

## 2012-11-21 DIAGNOSIS — B369 Superficial mycosis, unspecified: Secondary | ICD-10-CM | POA: Diagnosis present

## 2012-11-21 DIAGNOSIS — G40219 Localization-related (focal) (partial) symptomatic epilepsy and epileptic syndromes with complex partial seizures, intractable, without status epilepticus: Secondary | ICD-10-CM | POA: Diagnosis present

## 2012-11-21 DIAGNOSIS — J9600 Acute respiratory failure, unspecified whether with hypoxia or hypercapnia: Secondary | ICD-10-CM

## 2012-11-21 DIAGNOSIS — R197 Diarrhea, unspecified: Secondary | ICD-10-CM

## 2012-11-21 DIAGNOSIS — J9610 Chronic respiratory failure, unspecified whether with hypoxia or hypercapnia: Secondary | ICD-10-CM | POA: Diagnosis present

## 2012-11-21 DIAGNOSIS — G9340 Encephalopathy, unspecified: Secondary | ICD-10-CM | POA: Diagnosis present

## 2012-11-21 DIAGNOSIS — R1310 Dysphagia, unspecified: Secondary | ICD-10-CM

## 2012-11-21 DIAGNOSIS — H519 Unspecified disorder of binocular movement: Secondary | ICD-10-CM | POA: Diagnosis present

## 2012-11-21 DIAGNOSIS — Z931 Gastrostomy status: Secondary | ICD-10-CM

## 2012-11-21 DIAGNOSIS — R61 Generalized hyperhidrosis: Secondary | ICD-10-CM | POA: Diagnosis present

## 2012-11-21 DIAGNOSIS — R509 Fever, unspecified: Secondary | ICD-10-CM

## 2012-11-21 DIAGNOSIS — G40909 Epilepsy, unspecified, not intractable, without status epilepticus: Secondary | ICD-10-CM

## 2012-11-21 DIAGNOSIS — H35109 Retinopathy of prematurity, unspecified, unspecified eye: Secondary | ICD-10-CM

## 2012-11-21 DIAGNOSIS — R6510 Systemic inflammatory response syndrome (SIRS) of non-infectious origin without acute organ dysfunction: Secondary | ICD-10-CM | POA: Diagnosis present

## 2012-11-21 DIAGNOSIS — IMO0002 Reserved for concepts with insufficient information to code with codable children: Secondary | ICD-10-CM | POA: Diagnosis present

## 2012-11-21 DIAGNOSIS — G709 Myoneural disorder, unspecified: Secondary | ICD-10-CM | POA: Diagnosis present

## 2012-11-21 DIAGNOSIS — G40309 Generalized idiopathic epilepsy and epileptic syndromes, not intractable, without status epilepticus: Secondary | ICD-10-CM

## 2012-11-21 DIAGNOSIS — G808 Other cerebral palsy: Secondary | ICD-10-CM

## 2012-11-21 DIAGNOSIS — J690 Pneumonitis due to inhalation of food and vomit: Principal | ICD-10-CM

## 2012-11-21 DIAGNOSIS — M62838 Other muscle spasm: Secondary | ICD-10-CM | POA: Diagnosis present

## 2012-11-21 DIAGNOSIS — R000 Tachycardia, unspecified: Secondary | ICD-10-CM | POA: Diagnosis present

## 2012-11-21 DIAGNOSIS — R339 Retention of urine, unspecified: Secondary | ICD-10-CM | POA: Diagnosis present

## 2012-11-21 DIAGNOSIS — G40209 Localization-related (focal) (partial) symptomatic epilepsy and epileptic syndromes with complex partial seizures, not intractable, without status epilepticus: Secondary | ICD-10-CM

## 2012-11-21 DIAGNOSIS — H5089 Other specified strabismus: Secondary | ICD-10-CM

## 2012-11-21 DIAGNOSIS — Z8679 Personal history of other diseases of the circulatory system: Secondary | ICD-10-CM | POA: Diagnosis present

## 2012-11-21 DIAGNOSIS — M810 Age-related osteoporosis without current pathological fracture: Secondary | ICD-10-CM

## 2012-11-21 DIAGNOSIS — M4140 Neuromuscular scoliosis, site unspecified: Secondary | ICD-10-CM | POA: Diagnosis present

## 2012-11-21 DIAGNOSIS — E860 Dehydration: Secondary | ICD-10-CM

## 2012-11-21 DIAGNOSIS — M4120 Other idiopathic scoliosis, site unspecified: Secondary | ICD-10-CM | POA: Diagnosis present

## 2012-11-21 DIAGNOSIS — J9811 Atelectasis: Secondary | ICD-10-CM

## 2012-11-21 DIAGNOSIS — K8020 Calculus of gallbladder without cholecystitis without obstruction: Secondary | ICD-10-CM | POA: Diagnosis present

## 2012-11-21 DIAGNOSIS — J9620 Acute and chronic respiratory failure, unspecified whether with hypoxia or hypercapnia: Secondary | ICD-10-CM | POA: Diagnosis present

## 2012-11-21 LAB — CBC WITH DIFFERENTIAL/PLATELET
Basophils Absolute: 0 10*3/uL (ref 0.0–0.1)
Basophils Relative: 0 % (ref 0–1)
Eosinophils Absolute: 0.1 10*3/uL (ref 0.0–0.7)
Hemoglobin: 17.9 g/dL — ABNORMAL HIGH (ref 13.0–17.0)
MCHC: 34.4 g/dL (ref 30.0–36.0)
MCV: 98.7 fL (ref 78.0–100.0)
Monocytes Absolute: 1 10*3/uL (ref 0.1–1.0)
Monocytes Relative: 9 % (ref 3–12)
Neutro Abs: 9 10*3/uL — ABNORMAL HIGH (ref 1.7–7.7)
Neutrophils Relative %: 81 % — ABNORMAL HIGH (ref 43–77)
Platelets: 126 10*3/uL — ABNORMAL LOW (ref 150–400)
RBC: 5.27 MIL/uL (ref 4.22–5.81)
RDW: 13.8 % (ref 11.5–15.5)

## 2012-11-21 LAB — URINALYSIS, ROUTINE W REFLEX MICROSCOPIC
Bilirubin Urine: NEGATIVE
Glucose, UA: NEGATIVE mg/dL
Hgb urine dipstick: NEGATIVE
Ketones, ur: NEGATIVE mg/dL
Protein, ur: NEGATIVE mg/dL
pH: 6 (ref 5.0–8.0)

## 2012-11-21 LAB — COMPREHENSIVE METABOLIC PANEL
ALT: 110 U/L — ABNORMAL HIGH (ref 0–53)
AST: 69 U/L — ABNORMAL HIGH (ref 0–37)
Albumin: 4 g/dL (ref 3.5–5.2)
Alkaline Phosphatase: 183 U/L — ABNORMAL HIGH (ref 39–117)
BUN: 20 mg/dL (ref 6–23)
Chloride: 98 mEq/L (ref 96–112)
GFR calc non Af Amer: >90 mL/min (ref >90)
Potassium: 4.1 mEq/L (ref 3.5–5.1)
Sodium: 137 mEq/L (ref 135–145)
Total Bilirubin: 1.1 mg/dL (ref 0.3–1.2)
Total Protein: 8.1 g/dL (ref 6.0–8.3)

## 2012-11-21 MED ORDER — ALBUTEROL SULFATE (5 MG/ML) 0.5% IN NEBU: 2.5000 mg | INHALATION_SOLUTION | RESPIRATORY_TRACT | Status: DC | PRN

## 2012-11-21 MED ORDER — LANSOPRAZOLE 30 MG PO TBDP: 30.0000 mg | ORAL_TABLET | Freq: Every day | ORAL | Status: DC

## 2012-11-21 MED ORDER — GABAPENTIN 250 MG/5ML PO SOLN
800.0000 mg | Freq: Three times a day (TID) | ORAL | Status: DC
  Administered 2012-11-22 – 2012-11-29 (×22): 800 mg via ORAL
  Filled 2012-11-21 (×27): qty 16

## 2012-11-21 MED ORDER — HEPARIN SODIUM (PORCINE) 5000 UNIT/ML IJ SOLN
5000.0000 [IU] | Freq: Three times a day (TID) | INTRAMUSCULAR | Status: DC
  Administered 2012-11-22 – 2012-11-29 (×20): 5000 [IU] via SUBCUTANEOUS
  Filled 2012-11-21 (×26): qty 1

## 2012-11-21 MED ORDER — VITAMIN C 500 MG/5ML PO LIQD: 500.0000 mg | Freq: Every day | ORAL | Status: DC

## 2012-11-21 MED ORDER — BACLOFEN 40000 MCG/20ML IT SOLN: 326.0000 ug | INTRATHECAL | Status: DC

## 2012-11-21 MED ORDER — SODIUM CHLORIDE 0.9 % IV SOLN
INTRAVENOUS | Status: DC
  Administered 2012-11-28: 07:00:00 via INTRAVENOUS

## 2012-11-21 MED ORDER — ONDANSETRON HCL 4 MG/2ML IJ SOLN
4.0000 mg | Freq: Once | INTRAMUSCULAR | Status: AC
  Administered 2012-11-21: 4 mg via INTRAVENOUS
  Filled 2012-11-21: qty 2

## 2012-11-21 MED ORDER — LEVOFLOXACIN IN D5W 750 MG/150ML IV SOLN
750.0000 mg | INTRAVENOUS | Status: DC
  Administered 2012-11-22: 750 mg via INTRAVENOUS
  Filled 2012-11-21 (×2): qty 150

## 2012-11-21 MED ORDER — LEVETIRACETAM 100 MG/ML PO SOLN
1750.0000 mg | Freq: Once | ORAL | Status: AC
  Administered 2012-11-21: 1750 mg via ORAL
  Filled 2012-11-21: qty 20

## 2012-11-21 MED ORDER — IOHEXOL 300 MG/ML  SOLN: 25.0000 mL | INTRAMUSCULAR | Status: AC

## 2012-11-21 MED ORDER — FOLIC ACID 1 MG PO TABS
1.0000 mg | ORAL_TABLET | Freq: Every day | ORAL | Status: DC
  Administered 2012-11-22 – 2012-11-28 (×8): 1 mg via ORAL
  Filled 2012-11-21 (×9): qty 1

## 2012-11-21 MED ORDER — VANCOMYCIN HCL IN DEXTROSE 1-5 GM/200ML-% IV SOLN: 1000.0000 mg | Freq: Once | INTRAVENOUS | Status: DC

## 2012-11-21 MED ORDER — CALCIUM CARBONATE 1250 MG/5ML PO SUSP
500.0000 mg | Freq: Two times a day (BID) | ORAL | Status: DC
  Administered 2012-11-22 – 2012-11-29 (×15): 500 mg
  Filled 2012-11-21 (×18): qty 5

## 2012-11-21 MED ORDER — LORAZEPAM 2 MG/ML IJ SOLN
0.5000 mg | INTRAMUSCULAR | Status: DC | PRN
  Administered 2012-11-22 (×2): 0.5 mg via INTRAVENOUS
  Administered 2012-11-24: 1 mg via INTRAVENOUS
  Filled 2012-11-21 (×3): qty 1

## 2012-11-21 MED ORDER — LORAZEPAM 2 MG/ML IJ SOLN
0.5000 mg | Freq: Once | INTRAMUSCULAR | Status: AC
  Administered 2012-11-21: 0.5 mg via INTRAVENOUS
  Filled 2012-11-21: qty 1

## 2012-11-21 MED ORDER — VANCOMYCIN HCL IN DEXTROSE 1-5 GM/200ML-% IV SOLN
1000.0000 mg | Freq: Once | INTRAVENOUS | Status: AC
  Administered 2012-11-22: 1000 mg via INTRAVENOUS
  Filled 2012-11-21: qty 200

## 2012-11-21 MED ORDER — LOPERAMIDE HCL 1 MG/5ML PO LIQD
2.0000 mg | Freq: Four times a day (QID) | ORAL | Status: DC | PRN
  Administered 2012-11-22 – 2012-11-29 (×7): 2 mg
  Filled 2012-11-21 (×5): qty 10

## 2012-11-21 MED ORDER — IOHEXOL 300 MG/ML  SOLN
80.0000 mL | Freq: Once | INTRAMUSCULAR | Status: AC | PRN
  Administered 2012-11-21: 80 mL via INTRAVENOUS

## 2012-11-21 MED ORDER — VITAMIN C 500 MG/5ML PO SYRP
500.0000 mg | ORAL_SOLUTION | Freq: Every day | ORAL | Status: DC
  Administered 2012-11-22 – 2012-11-28 (×7): 500 mg
  Filled 2012-11-21 (×8): qty 5

## 2012-11-21 MED ORDER — NYSTATIN 100000 UNIT/GM EX CREA
TOPICAL_CREAM | Freq: Two times a day (BID) | CUTANEOUS | Status: DC
  Administered 2012-11-22: 1 via TOPICAL
  Administered 2012-11-23 – 2012-11-29 (×14): via TOPICAL
  Filled 2012-11-21 (×2): qty 15

## 2012-11-21 MED ORDER — HYDROMORPHONE HCL PF 1 MG/ML IJ SOLN
0.5000 mg | Freq: Once | INTRAMUSCULAR | Status: AC
  Administered 2012-11-21: 0.5 mg via INTRAVENOUS

## 2012-11-21 MED ORDER — ACETAMINOPHEN 160 MG/5ML PO SOLN
325.0000 mg | ORAL | Status: DC | PRN
  Administered 2012-11-22: 325 mg
  Filled 2012-11-21: qty 20.3

## 2012-11-21 MED ORDER — SUCRALFATE 1 GM/10ML PO SUSP
1.0000 g | Freq: Three times a day (TID) | ORAL | Status: DC
  Administered 2012-11-22: 1 g
  Filled 2012-11-21 (×5): qty 10

## 2012-11-21 MED ORDER — VANCOMYCIN HCL 10 G IV SOLR: 1000.0000 mg | Freq: Once | INTRAVENOUS | Status: DC

## 2012-11-21 MED ORDER — SODIUM CHLORIDE 0.9 % IV BOLUS (SEPSIS)
1000.0000 mL | Freq: Once | INTRAVENOUS | Status: AC
  Administered 2012-11-21: 1000 mL via INTRAVENOUS

## 2012-11-21 MED ORDER — LEVOFLOXACIN IN D5W 750 MG/150ML IV SOLN
750.0000 mg | INTRAVENOUS | Status: DC
  Administered 2012-11-21: 750 mg via INTRAVENOUS

## 2012-11-21 MED ORDER — LEVOFLOXACIN IN D5W 750 MG/150ML IV SOLN
750.0000 mg | INTRAVENOUS | Status: DC
  Administered 2012-11-21: 750 mg via INTRAVENOUS
  Filled 2012-11-21: qty 150

## 2012-11-21 MED ORDER — PANTOPRAZOLE SODIUM 40 MG PO PACK
40.0000 mg | PACK | Freq: Every day | ORAL | Status: DC
  Administered 2012-11-22 – 2012-11-23 (×2): 40 mg via ORAL
  Filled 2012-11-21 (×3): qty 20

## 2012-11-21 MED ORDER — ALBUTEROL SULFATE (5 MG/ML) 0.5% IN NEBU
2.5000 mg | INHALATION_SOLUTION | RESPIRATORY_TRACT | Status: DC
  Administered 2012-11-21 – 2012-11-29 (×42): 2.5 mg via RESPIRATORY_TRACT
  Filled 2012-11-21 (×42): qty 0.5

## 2012-11-21 MED ORDER — FREE WATER
100.0000 mL | Freq: Every day | Status: DC
  Administered 2012-11-22: 200 mL

## 2012-11-21 MED ORDER — LEVETIRACETAM 100 MG/ML PO SOLN
1750.0000 mg | Freq: Two times a day (BID) | ORAL | Status: DC
  Administered 2012-11-22 – 2012-11-29 (×15): 1750 mg
  Filled 2012-11-21 (×17): qty 20

## 2012-11-21 MED ORDER — SUCRALFATE 1 G PO TABS: 1.0000 g | ORAL_TABLET | Freq: Four times a day (QID) | ORAL | Status: DC

## 2012-11-21 MED ORDER — SODIUM CHLORIDE 0.9 % IN NEBU
INHALATION_SOLUTION | RESPIRATORY_TRACT | Status: AC
  Administered 2012-11-21: 3 mL
  Filled 2012-11-21: qty 3

## 2012-11-21 MED ORDER — HYDROMORPHONE HCL PF 1 MG/ML IJ SOLN
0.5000 mg | Freq: Once | INTRAMUSCULAR | Status: AC
  Administered 2012-11-21: 0.5 mg via INTRAVENOUS
  Filled 2012-11-21: qty 1

## 2012-11-21 MED ORDER — LOPERAMIDE HCL 2 MG PO TABS: 2.0000 mg | ORAL_TABLET | Freq: Four times a day (QID) | ORAL | Status: DC | PRN

## 2012-11-21 MED ORDER — GABAPENTIN 250 MG/5ML PO SOLN
800.0000 mg | Freq: Once | ORAL | Status: AC
  Administered 2012-11-21: 800 mg via ORAL
  Filled 2012-11-21: qty 16

## 2012-11-21 MED ORDER — SODIUM CHLORIDE 0.9 % IV BOLUS (SEPSIS)
500.0000 mL | Freq: Once | INTRAVENOUS | Status: AC
  Administered 2012-11-21: 500 mL via INTRAVENOUS

## 2012-11-21 MED ORDER — HYDROMORPHONE HCL PF 1 MG/ML IJ SOLN
0.2500 mg | INTRAMUSCULAR | Status: DC | PRN
  Administered 2012-11-22: 0.25 mg via INTRAVENOUS
  Administered 2012-11-22: 0.5 mg via INTRAVENOUS
  Administered 2012-11-22: 0.25 mg via INTRAVENOUS
  Administered 2012-11-23 – 2012-11-29 (×6): 0.5 mg via INTRAVENOUS
  Filled 2012-11-21 (×9): qty 1

## 2012-11-21 MED ORDER — MORPHINE SULFATE 4 MG/ML IJ SOLN
4.0000 mg | Freq: Once | INTRAMUSCULAR | Status: AC
  Administered 2012-11-21: 4 mg via INTRAVENOUS
  Filled 2012-11-21: qty 1

## 2012-11-21 MED ORDER — ENSURE PLUS PO LIQD
237.0000 mL | Freq: Four times a day (QID) | ORAL | Status: DC
Start: 2012-11-22 — End: 2012-11-22
  Administered 2012-11-22: 237 mL via ORAL
  Filled 2012-11-21 (×6): qty 237

## 2012-11-21 MED ORDER — ACETAMINOPHEN 160 MG/5ML PO SOLN
500.0000 mg | Freq: Once | ORAL | Status: AC
  Administered 2012-11-21: 500 mg via ORAL
  Filled 2012-11-21: qty 20.3

## 2012-11-21 MED ORDER — SODIUM CHLORIDE 0.9 % IV SOLN
INTRAVENOUS | Status: DC
  Administered 2012-11-21 – 2012-11-22 (×2): via INTRAVENOUS

## 2012-11-21 MED ORDER — LORAZEPAM 2 MG/ML IJ SOLN
0.5000 mg | Freq: Once | INTRAMUSCULAR | Status: AC
  Administered 2012-11-21: 0.5 mg via INTRAVENOUS

## 2012-11-21 MED ORDER — MUPIROCIN 2 % EX OINT
1.0000 "application " | TOPICAL_OINTMENT | CUTANEOUS | Status: DC | PRN
  Filled 2012-11-21: qty 22

## 2012-11-21 MED ORDER — ADULT MULTIVITAMIN LIQUID CH
10.0000 mL | Freq: Every day | ORAL | Status: DC
  Administered 2012-11-22: 10 mL
  Filled 2012-11-21: qty 10

## 2012-11-21 MED ORDER — FREE WATER
200.0000 mL | Freq: Every day | Status: DC
  Administered 2012-11-22 (×3): 200 mL

## 2012-11-21 MED ORDER — PRO-STAT SUGAR FREE PO LIQD
30.0000 mL | Freq: Two times a day (BID) | ORAL | Status: DC
  Administered 2012-11-22 – 2012-11-29 (×15): 30 mL
  Filled 2012-11-21 (×17): qty 30

## 2012-11-21 NOTE — ED Provider Notes (Addendum)
CSN: [id number]     Arrival date & time 11/21/12  1319 History   First MD Initiated Contact with Patient 11/21/12 1345     Chief Complaint  Patient presents with  . Fever   (Consider location/radiation/quality/duration/timing/severity/associated sxs/prior Treatment) HPI Comments: 22 yo male with CP, quadriplegia, UTI, Dehydration, feeding tube hx presents with fever, increased hr the past 24 hrs.  Pt has been admitted for similar for dehydration however guardian/ parent who is a nurse feels he is showing discomfort at this time- difficult to localize with pt being non verbal.  Pt has appendix and gb.  Mild cough.  No vomiting.  Sweating.  Diarrhea improving per mother.   Patient is a 22 y.o. male presenting with fever. The history is provided by a parent.  Fever   Past Medical History  Diagnosis Date  . CP (cerebral palsy), spastic, quadriplegic   . Epilepsy   . Osteoporosis   . Inguinal hernia   . Undescended testes   . Seasonal allergies   . IVH (intraventricular hemorrhage)     Grade IV  . Hip dislocation, bilateral   . Seizures   . Hx: UTI (urinary tract infection)   . Dysphagia   . Otitis media   . Retinopathy of prematurity   . 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 Neuromuscular scoliosis   . Osteoporosis   . Complex partial seizures   . Generalized convulsive epilepsy without mention of intractable epilepsy   . Epilepsy   . Sinus bradycardia     HR drops to 38-40 while sleeping  . Sleep apnea     BiPAP  . Blister of right heel     fluid filled; origin unknown  . Kidney stones     ?  Marland Kitchen Pneumonia      chronic pneumonia ,respitory failure dx Augest 2014   Past Surgical History  Procedure Laterality Date  . Excision of moles    . Peg placement      With multiple changes  . Gastrostomy tube, change / reposition  12/15/2010       . Button change  12/15/2010    Procedure: BUTTON CHANGE;  Surgeon: Iva Boop, MD;  Location: [facility];  Service:  Endoscopy;  Laterality: N/A;  . Peg placement  10/07/2011    Procedure: PERCUTANEOUS ENDOSCOPIC GASTROSTOMY (PEG) REPLACEMENT;  Surgeon: Hart Carwin, MD;  Location: [facility];  Service: Endoscopy;  Laterality: N/A;  Needs 18 F 2.5 button ordered-dl  . Inguinal hernia repair Bilateral 1992  . Retinopathy of prematurity surgery  1992  . Achilles tendon lengthening  12/1998  . Soft tissue releases  wrists and fingers  12/1998  . Intrathecal baclofen pump placement  07/25/2000  . Peg placement N/A 06/05/2012    Procedure: PERCUTANEOUS ENDOSCOPIC GASTROSTOMY (PEG) REPLACEMENT;  Surgeon: Hart Carwin, MD;  Location: [facility];  Service: Endoscopy;  Laterality: N/A;  button 57fr.2.5cm  . Eye surgery Bilateral     Retinal   . Back surgery      Harrington Rods in back needs to be log rolled  . Tendon repair Bilateral 09/05/2012    Procedure: LENGTHENING OF DIGITAL FLEXOR TENDONS BILTERAL HANDS;  Surgeon: Jodi Marble, MD;  Location: [facility];  Service: Orthopedics;  Laterality: Bilateral;  . Hand surgery Bilateral Aug. 2014  . Peg placement N/A 09/13/2012    Procedure: PERCUTANEOUS ENDOSCOPIC GASTROSTOMY (PEG) REPLACEMENT;  Surgeon: Hart Carwin, MD;  Location: [facility];  Service: Endoscopy;  Laterality: N/A;  . Flexible sigmoidoscopy N/A 10/30/2012    Procedure: FLEXIBLE SIGMOIDOSCOPY;  Surgeon: Florencia Reasons, MD;  Location: [facility];  Service: Endoscopy;  Laterality: N/A;  . Peg placement N/A 11/15/2012    Procedure: PERCUTANEOUS ENDOSCOPIC GASTROSTOMY (PEG) REPLACEMENT;  Surgeon: Petra Kuba, MD;  Location: [facility];  Service: Endoscopy;  Laterality: N/A;   Family History  Problem Relation Age of Onset  . Adopted: Yes   History  Substance Use Topics  . Smoking status: Never Smoker   . Smokeless tobacco: Never Used  . Alcohol Use: No    Review of Systems  Unable to perform ROS Constitutional: Positive for fever.    Allergies  Adhesive and Depakote  Home  Medications   Current Outpatient Rx  Name  Route  Sig  Dispense  Refill  . albuterol (PROVENTIL HFA;VENTOLIN HFA) 108 (90 BASE) MCG/ACT inhaler   Inhalation   Inhale 2 puffs into the lungs every 4 (four) hours as needed for shortness of breath.         Marland Kitchen albuterol (PROVENTIL) (2.5 MG/3ML) 0.083% nebulizer solution   Nebulization   Take 3 mLs (2.5 mg total) by nebulization 2 (two) times daily. And as needed.  Dx: 507.0   300 mL   5   . Ascorbic Acid (VITAMIN C) 500 MG/5ML LIQD   PEG Tube   500 mg by PEG Tube route at bedtime.          . calcium carbonate, dosed in mg elemental calcium, 1250 MG/5ML   PEG Tube   by PEG Tube route 2 (two) times daily. 4 cc via peg tube - ON HOLD UNTIL 11/17/12 PER DR. [name]         . ENSURE PLUS (ENSURE PLUS) LIQD   Oral   Take 237 mLs by mouth 4 (four) times daily.          . feeding supplement (PRO-STAT SUGAR FREE 64) LIQD   Per Tube   Place 30 mLs into feeding tube 2 (two) times daily.   900 mL   0   . folic acid (FOLVITE) 1 MG tablet   Oral   Take 1 mg by mouth daily.         . lansoprazole (PREVACID SOLUTAB) 30 MG disintegrating tablet   Oral   Take 30 mg by mouth daily.          Marland Kitchen levETIRAcetam (KEPPRA) 100 MG/ML solution      Give 17.5 mls by PEG tube twice per day.   1200 mL   5     Dispense as written.    Brand Medically Necessary   . loperamide (IMODIUM A-D) 2 MG tablet   Oral   Take 2 mg by mouth 4 (four) times daily as needed for diarrhea or loose stools.         . Multiple Vitamin (MULTIVITAMIN) LIQD   PEG Tube   10 mLs by PEG Tube route daily.         . Multiple Vitamins-Minerals (ZINC PO)   PEG Tube   5 mLs by PEG Tube route daily. 244 mg / 5 mL zinc solution         . mupirocin ointment (BACTROBAN) 2 %   Topical   Apply 1 application topically as needed (to G-T site).   22 g   1   . NEURONTIN 250 MG/5ML solution      Give 16 mL by mouth 3 times daily  1632 mL   2     Dispense as  written.    Brand Name Medically Necessary   . sucralfate (CARAFATE) 1 G tablet   PEG Tube   1 g by PEG Tube route 4 (four) times daily.         . baclofen (GABLOFEN) 40000 MCG/20ML SOLN   Intrathecal   326 mcg by Intrathecal route continuous.          . Cyanocobalamin (VITAMIN B-12 IJ)   Injection   Inject as directed every 30 (thirty) days.         . OnabotulinumtoxinA (BOTOX IM)   Intramuscular   Inject 1 mL into the muscle every 3 (three) months. Last taken on 10-11-12         . OXYGEN-HELIUM IN   Inhalation   Inhale into the lungs as needed.          BP 132/91  Pulse 96  Temp(Src) 99.2 F (37.3 C) (Rectal)  Resp 14  SpO2 96% Physical Exam  Nursing note and vitals reviewed. Constitutional: He appears well-developed. No distress.  HENT:  Head: Normocephalic and atraumatic.  Dry mm  Neck: Neck supple.  Cardiovascular: Tachycardia present.   Pulses:      Radial pulses are 2+ on the right side, and 2+ on the left side.  Pulmonary/Chest:  Few rales bilateral  Abdominal: He exhibits distension. There is tenderness.  G tube in place, no surrounding erythema Mild signs of tender right sided abdomen, mild distension  Musculoskeletal: He exhibits no edema.  Lymphadenopathy:    He has no cervical adenopathy.  Neurological: He is alert.  Flexion of arms and legs Alert to voice and follows some commands Baseline per mother Tight musculature  Skin: Skin is warm. Rash (no specific macules upper chest and back, no petechia or purpura) noted.    ED Course  Procedures (including critical care time) Emergency Ultrasound Study:   Angiocath insertion Performed by: Enid Skeens  Consent: Verbal consent obtained. Risks and benefits: risks, benefits and alternatives were discussed Immediately prior to procedure the correct patient, procedure, equipment, support staff and site/side marked as needed.  Indication: difficult IV access Preparation: Patient was  prepped and draped in the usual sterile fashion. Vein Location: right ac vein was visualized during assessment for potential access sites and was found to be patent/ easily compressed with linear ultrasound.  The needle was visualized with real-time ultrasound and guided into the vein. Gauge: 20 g  Image saved and stored.  Normal blood return.  Patient tolerance: Patient tolerated the procedure well with no immediate complications.    Emergency Ultrasound Study:   Angiocath insertion Performed by: Enid Skeens  Consent: Verbal consent obtained. Risks and benefits: risks, benefits and alternatives were discussed Immediately prior to procedure the correct patient, procedure, equipment, support staff and site/side marked as needed.  Indication: difficult IV access Preparation: Patient was prepped and draped in the usual sterile fashion. Vein Location: right brachial vein was visualized during assessment for potential access sites and was found to be patent/ easily compressed with linear ultrasound.  The needle was visualized with real-time ultrasound and guided into the vein. Gauge: 20 g  Image saved and stored.  Normal blood return.  Patient tolerance: Patient tolerated the procedure well with no immediate complications.     Labs Review Labs Reviewed  CBC WITH DIFFERENTIAL - Abnormal; Notable for the following:    WBC 11.2 (*)    Hemoglobin 17.9 (*)  Platelets 126 (*)    Neutrophils Relative % 81 (*)    Neutro Abs 9.0 (*)    Lymphocytes Relative 10 (*)    All other components within normal limits  COMPREHENSIVE METABOLIC PANEL - Abnormal; Notable for the following:    Creatinine, Ser 0.48 (*)    AST 69 (*)    ALT 110 (*)    Alkaline Phosphatase 183 (*)    All other components within normal limits  URINALYSIS, ROUTINE W REFLEX MICROSCOPIC  LIPASE, BLOOD   Imaging Review Dg Chest 2 View  11/21/2012   CLINICAL DATA:  Fever for 8 weeks.  EXAM: CHEST  2 VIEW   COMPARISON:  Multiple priors  FINDINGS: The cardiomediastinal silhouette is unchanged. No focal infiltrate or edema. No pleural effusion or pneumothorax. Osseous structures are unchanged, including stable rib deformities in spinal fusion rods.  IMPRESSION: No active cardiopulmonary disease.   Electronically Signed   By: Jerene Dilling M.D.   On: 11/21/2012 16:15    EKG Interpretation   None       MDM  No diagnosis found. Clinically dehydrated and with mild wbc/ fever at home concern for infectious source. Very difficult IV, multiple attempts, 2 IV US guided placed by myself.  CXR and ua no acute findings.  Rechecks, fluid started later when IV obtained. CT abd/ pel pending. Signed out with plan for admission for dehydration and pending CT results.   The patients results and plan were reviewed and discussed.   Any x-rays performed were personally reviewed by myself.   Differential diagnosis were considered with the presenting HPI.  Diagnosis: Dehydration, SIRS, CP  Admission/ observation were discussed with the admitting physician, patient and/or family and they are comfortable with the plan.      Enid Skeens, MD 11/21/12 1816  Enid Skeens, MD 11/22/12 [phone]

## 2012-11-21 NOTE — ED Notes (Signed)
Patient transported to back from ultra sound, Blood cx done

## 2012-11-21 NOTE — ED Notes (Signed)
Patient transported to Ultrasound 

## 2012-11-21 NOTE — ED Notes (Signed)
Patient's  mother refused any more IV sticks until patient gets PICC line.

## 2012-11-21 NOTE — Progress Notes (Signed)
Patient mom wanting low dose dilaudid, ativan and tylenol for agitation and fever  Orders sent   Dr. Kalman Shan, M.D., [facility].C.P Pulmonary and Critical Care Medicine Staff Physician [facility] [place] [facility] Pager: [phone], If no answer or between  15:00h - 7:00h: call [phone]  11/21/2012 11:53 PM

## 2012-11-21 NOTE — ED Notes (Signed)
Had feeding tube placed in August-- it got kinked and aspirated, had new feeding tube last Wednesday-- Mother states pt is "not acting himself" "had a cough, but dropped sat to 80% but came back up with non-rebreather-- also has been tachycardic, loose stools, but none yesterday-- first day in 8 weeks without diarrhea" Mother states pt is anxious, has been diaphoretic to where has had to have clothes changed multiple times.   Mother also had tip of feeding tube cultured, and brought results with her--

## 2012-11-21 NOTE — H&P (Signed)
PULMONARY  / CRITICAL CARE MEDICINE  Name: Jeffery [name] MRN: [id number] DOB: 04-14-1990    ADMISSION DATE:  11/21/2012 CONSULTATION DATE:  11/21/2012  REFERRING MD :  EDP PRIMARY SERVICE:  PCCM  CHIEF COMPLAINT:  Fever  BRIEF PATIENT DESCRIPTION: 22 yo with past medical history of cerebral palsy and qudriplegia brought to ED with low grade fever, tachycardia, sweats and agitation since one day PTA.  There was no cough or witnessed aspiration. Mother also reports persistent diarrhea for several month with workup including several Clostridium Difficile PCR and colon biopsy being non revealing.  He was treated imperically with Flagyl.  SIGNIFICANT EVENTS / STUDIES:  10/21  Brought in with low grade fever, tachycardia and agitation 10/21  CT abdomen / pelvis >>> Patchy airspace disease RLL, improved compared to previous imaging, distended bladder, resolved colon thickening 10/21  GB US >>>  LINES / TUBES:  CULTURES: 10/21 Blood >>> 01/21 Urine >>>  ANTIBIOTICS: Levaqin 10/21 >>> Vancomycin 10/21 x 1  The patient is encephalopathic and unable to provide history, which was obtained for available medical records.  HISTORY OF PRESENT ILLNESS:  22 yo with past medical history of cerebral palsy and qudriplegia brought to ED with low grade fever, tachycardia, sweats and agitation since one day PTA.  There was no cough or witnessed aspiration. Mother also reports persistent diarrhea for several month with workup including several Clostridium Difficile PCR and colon biopsy being non revealing.  He was treated imperically with Flagyl.  PAST MEDICAL HISTORY :  Past Medical History  Diagnosis Date  . CP (cerebral palsy), spastic, quadriplegic   . Epilepsy   . Osteoporosis   . Inguinal hernia   . Undescended testes   . Seasonal allergies   . IVH (intraventricular hemorrhage)     Grade IV  . Hip dislocation, bilateral   . Seizures   . Hx: UTI (urinary tract infection)   . Dysphagia   .  Otitis media   . Retinopathy of prematurity   . 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 Neuromuscular scoliosis   . Osteoporosis   . Complex partial seizures   . Generalized convulsive epilepsy without mention of intractable epilepsy   . Epilepsy   . Sinus bradycardia     HR drops to 38-40 while sleeping  . Sleep apnea     BiPAP  . Blister of right heel     fluid filled; origin unknown  . Kidney stones     ?  Marland Kitchen Pneumonia      chronic pneumonia ,respitory failure dx Augest 2014   Past Surgical History  Procedure Laterality Date  . Excision of moles    . Peg placement      With multiple changes  . Gastrostomy tube, change / reposition  12/15/2010       . Button change  12/15/2010    Procedure: BUTTON CHANGE;  Surgeon: Iva Boop, MD;  Location: [facility];  Service: Endoscopy;  Laterality: N/A;  . Peg placement  10/07/2011    Procedure: PERCUTANEOUS ENDOSCOPIC GASTROSTOMY (PEG) REPLACEMENT;  Surgeon: Hart Carwin, MD;  Location: [facility];  Service: Endoscopy;  Laterality: N/A;  Needs 18 F 2.5 button ordered-dl  . Inguinal hernia repair Bilateral 1992  . Retinopathy of prematurity surgery  1992  . Achilles tendon lengthening  12/1998  . Soft tissue releases  wrists and fingers  12/1998  . Intrathecal baclofen pump placement  07/25/2000  . Peg placement N/A 06/05/2012  Procedure: PERCUTANEOUS ENDOSCOPIC GASTROSTOMY (PEG) REPLACEMENT;  Surgeon: Hart Carwin, MD;  Location: [facility];  Service: Endoscopy;  Laterality: N/A;  button 4fr.2.5cm  . Eye surgery Bilateral     Retinal   . Back surgery      Harrington Rods in back needs to be log rolled  . Tendon repair Bilateral 09/05/2012    Procedure: LENGTHENING OF DIGITAL FLEXOR TENDONS BILTERAL HANDS;  Surgeon: Jodi Marble, MD;  Location: [facility] OR;  Service: Orthopedics;  Laterality: Bilateral;  . Hand surgery Bilateral Aug. 2014  . Peg placement N/A 09/13/2012    Procedure: PERCUTANEOUS ENDOSCOPIC GASTROSTOMY  (PEG) REPLACEMENT;  Surgeon: Hart Carwin, MD;  Location: [facility];  Service: Endoscopy;  Laterality: N/A;  . Flexible sigmoidoscopy N/A 10/30/2012    Procedure: FLEXIBLE SIGMOIDOSCOPY;  Surgeon: Florencia Reasons, MD;  Location: [facility];  Service: Endoscopy;  Laterality: N/A;  . Peg placement N/A 11/15/2012    Procedure: PERCUTANEOUS ENDOSCOPIC GASTROSTOMY (PEG) REPLACEMENT;  Surgeon: Petra Kuba, MD;  Location: [facility] ENDOSCOPY;  Service: Endoscopy;  Laterality: N/A;   Prior to Admission medications   Medication Sig Start Date End Date Taking? Authorizing Provider  albuterol (PROVENTIL HFA;VENTOLIN HFA) 108 (90 BASE) MCG/ACT inhaler Inhale 2 puffs into the lungs every 4 (four) hours as needed for shortness of breath.   Yes Historical Provider, MD  albuterol (PROVENTIL) (2.5 MG/3ML) 0.083% nebulizer solution Take 3 mLs (2.5 mg total) by nebulization 2 (two) times daily. And as needed.  Dx: 507.0 10/11/12  Yes Storm Frisk, MD  Ascorbic Acid (VITAMIN C) 500 MG/5ML LIQD 500 mg by PEG Tube route at bedtime.    Yes Historical Provider, MD  calcium carbonate, dosed in mg elemental calcium, 1250 MG/5ML by PEG Tube route 2 (two) times daily. 4 cc via peg tube - ON HOLD UNTIL 11/17/12 PER DR. [name]   Yes Historical Provider, MD  ENSURE PLUS (ENSURE PLUS) LIQD Take 237 mLs by mouth 4 (four) times daily.    Yes Historical Provider, MD  feeding supplement (PRO-STAT SUGAR FREE 64) LIQD Place 30 mLs into feeding tube 2 (two) times daily. 09/22/12  Yes Courtney Paris, MD  folic acid (FOLVITE) 1 MG tablet Take 1 mg by mouth daily.   Yes Historical Provider, MD  lansoprazole (PREVACID SOLUTAB) 30 MG disintegrating tablet Take 30 mg by mouth daily.  09/07/12  Yes Hart Carwin, MD  levETIRAcetam (KEPPRA) 100 MG/ML solution Give 17.5 mls by PEG tube twice per day. 11/17/12  Yes Elveria Rising, NP  loperamide (IMODIUM A-D) 2 MG tablet Take 2 mg by mouth 4 (four) times daily as needed for diarrhea or loose  stools.   Yes Historical Provider, MD  Multiple Vitamin (MULTIVITAMIN) LIQD 10 mLs by PEG Tube route daily.   Yes Historical Provider, MD  Multiple Vitamins-Minerals (ZINC PO) 5 mLs by PEG Tube route daily. 244 mg / 5 mL zinc solution   Yes Historical Provider, MD  mupirocin ointment (BACTROBAN) 2 % Apply 1 application topically as needed (to G-T site). 09/07/12  Yes Hart Carwin, MD  NEURONTIN 250 MG/5ML solution Give 16 mL by mouth 3 times daily 09/29/12  Yes Elveria Rising, NP  sucralfate (CARAFATE) 1 G tablet 1 g by PEG Tube route 4 (four) times daily.   Yes Historical Provider, MD  baclofen (GABLOFEN) 40000 MCG/20ML SOLN 326 mcg by Intrathecal route continuous.  05/04/12   Deetta Perla, MD  Cyanocobalamin (VITAMIN B-12 IJ) Inject as directed  every 30 (thirty) days.    Historical Provider, MD  OnabotulinumtoxinA (BOTOX IM) Inject 1 mL into the muscle every 3 (three) months. Last taken on 10-11-12    Historical Provider, MD  OXYGEN-HELIUM IN Inhale into the lungs as needed.    Historical Provider, MD   Allergies  Allergen Reactions  . Adhesive [Tape]     Rips skin off  . Depakote [Divalproex Sodium]     Causes pancreatitis    FAMILY HISTORY:  Family History  Problem Relation Age of Onset  . Adopted: Yes   SOCIAL HISTORY:  reports that he has never smoked. He has never used smokeless tobacco. He reports that he does not drink alcohol or use illicit drugs.  REVIEW OF SYSTEMS:  Unable to provide.  INTERVAL HISTORY:  VITAL SIGNS: Temp:  [99.2 F (37.3 C)] 99.2 F (37.3 C) (10/21 1346) Pulse Rate:  [96-142] 140 (10/21 2125) Resp:  [12-28] 20 (10/21 2100) BP: (119-154)/(71-121) 130/78 mmHg (10/21 2125) SpO2:  [91 %-98 %] 94 % (10/21 2125) FiO2 (%):  [100 %] 100 % (10/21 2100)  HEMODYNAMICS:   VENTILATOR SETTINGS: Vent Mode:  [-]  FiO2 (%):  [100 %] 100 %  INTAKE / OUTPUT: Intake/Output   None     PHYSICAL EXAMINATION: General:  No acute distress Neuro:   Nonverbal, bilateral upper and lower extremities contractures HEENT:  PERRL, dry membraanes Cardiovascular:  Regula, tachycardic Lungs:  Bilateral air entry, scattered rhonchi Abdomen:  Soft, nontender, bowel sounds diminished, PEG Musculoskeletal:  No edema Skin:  Fungal rash neck / upper torso  LABS:  Recent Labs Lab 11/21/12 1454  HGB 17.9*  WBC 11.2*  PLT 126*  NA 137  K 4.1  CL 98  CO2 23  GLUCOSE 78  BUN 20  CREATININE 0.48*  CALCIUM 9.6  AST 69*  ALT 110*  ALKPHOS 183*  BILITOT 1.1  PROT 8.1  ALBUMIN 4.0   No results found for this basename: GLUCAP,  in the last 168 hours  CXR:  10/21 >>> No overt airspace disease  ASSESSMENT / PLAN:  Acute febrile illness Elevated liver enzymes Dehydration Urine retention Fungal dermatitis Cerebral palsy with quadriplegia and seizure disorder Dysphagia s/p PEG OSA  -->  Continue preadmission medications / therapies -->  Blood cx, urine cx, PCT -->  Continue empirical Levaquin -->  In / out cath -->  PICC -->  NS 500 x 1, then @ 100 -->  Follow GB US -->  Mycostatin topical -->  Heparin for DVT Px  I have personally obtained history, examined patient, evaluated and interpreted laboratory and imaging results, reviewed medical records, formulated assessment / plan and placed orders.  Lonia Farber, MD Pulmonary and Critical Care Medicine [facility] Pager: [phone]  11/21/2012, 9:27 PM

## 2012-11-21 NOTE — ED Notes (Addendum)
Sats down to 85% on room air. NRB aplied to patient. Stats up to 96%. Dr Jaclyn Prime notified. Patient's mother asked about calling Dr Inda Merlin and concern about bacoflen pump, Dr Jaclyn Prime notified.

## 2012-11-21 NOTE — ED Notes (Signed)
Patient has red petechia on his upper trunk and back.  Also on his left shoulder and trunk there are larger areas that are red and scaly.

## 2012-11-21 NOTE — ED Notes (Signed)
Second cup of contrast in.Marland KitchenMarland Kitchen

## 2012-11-21 NOTE — ED Notes (Signed)
Unable to get IV access. Dr Jodi Mourning attempted IV with ultrasound.

## 2012-11-22 DIAGNOSIS — J9819 Other pulmonary collapse: Secondary | ICD-10-CM

## 2012-11-22 DIAGNOSIS — G40209 Localization-related (focal) (partial) symptomatic epilepsy and epileptic syndromes with complex partial seizures, not intractable, without status epilepticus: Secondary | ICD-10-CM

## 2012-11-22 LAB — CBC
HCT: 45.3 % (ref 39.0–52.0)
Hemoglobin: 14.7 g/dL (ref 13.0–17.0)
MCH: 31.7 pg (ref 26.0–34.0)
MCHC: 32.5 g/dL (ref 30.0–36.0)
MCV: 97.6 fL (ref 78.0–100.0)
Platelets: 115 10*3/uL — ABNORMAL LOW (ref 150–400)
RBC: 4.64 MIL/uL (ref 4.22–5.81)
RDW: 13.5 % (ref 11.5–15.5)

## 2012-11-22 LAB — COMPREHENSIVE METABOLIC PANEL
ALT: 70 U/L — ABNORMAL HIGH (ref 0–53)
AST: 39 U/L — ABNORMAL HIGH (ref 0–37)
CO2: 25 mEq/L (ref 19–32)
Calcium: 8.3 mg/dL — ABNORMAL LOW (ref 8.4–10.5)
Chloride: 98 mEq/L (ref 96–112)
GFR calc non Af Amer: >90 mL/min (ref >90)
Sodium: 133 mEq/L — ABNORMAL LOW (ref 135–145)
Total Bilirubin: 1.5 mg/dL — ABNORMAL HIGH (ref 0.3–1.2)

## 2012-11-22 LAB — MRSA PCR SCREENING: MRSA by PCR: NEGATIVE

## 2012-11-22 LAB — GLUCOSE, CAPILLARY
Glucose-Capillary: 70 mg/dL (ref 70–99)
Glucose-Capillary: 119 mg/dL — ABNORMAL HIGH (ref 70–99)
Glucose-Capillary: 103 mg/dL — ABNORMAL HIGH (ref 70–99)

## 2012-11-22 LAB — PROCALCITONIN: Procalcitonin: <0.1 ng/mL

## 2012-11-22 LAB — PHOSPHORUS: Phosphorus: 3.4 mg/dL (ref 2.3–4.6)

## 2012-11-22 LAB — CLOSTRIDIUM DIFFICILE BY PCR: Toxigenic C. Difficile by PCR: NEGATIVE

## 2012-11-22 LAB — MAGNESIUM: Magnesium: 2.1 mg/dL (ref 1.5–2.5)

## 2012-11-22 MED ORDER — SODIUM CHLORIDE 0.9 % IJ SOLN
10.0000 mL | Freq: Two times a day (BID) | INTRAMUSCULAR | Status: DC
  Administered 2012-11-22: 20 mL
  Administered 2012-11-23: 10 mL
  Administered 2012-11-23: 20 mL
  Administered 2012-11-24 – 2012-11-26 (×5): 10 mL
  Administered 2012-11-26: 20 mL
  Administered 2012-11-27 – 2012-11-28 (×3): 10 mL
  Administered 2012-11-28: 20 mL
  Administered 2012-11-29: 10 mL

## 2012-11-22 MED ORDER — ZINC SULFATE 220 (50 ZN) MG PO CAPS: 244.0000 mg | ORAL_CAPSULE | ORAL | Status: DC

## 2012-11-22 MED ORDER — SUCRALFATE 1 GM/10ML PO SUSP
1.0000 g | Freq: Three times a day (TID) | ORAL | Status: DC
  Filled 2012-11-22 (×3): qty 10

## 2012-11-22 MED ORDER — SUCRALFATE 1 GM/10ML PO SUSP
1.0000 g | Freq: Every day | ORAL | Status: DC
  Administered 2012-11-22 – 2012-11-29 (×33): 1 g
  Filled 2012-11-22 (×38): qty 10

## 2012-11-22 MED ORDER — VANCOMYCIN HCL 500 MG IV SOLR
500.0000 mg | Freq: Two times a day (BID) | INTRAVENOUS | Status: DC
  Administered 2012-11-22 – 2012-11-23 (×3): 500 mg via INTRAVENOUS
  Filled 2012-11-22 (×5): qty 500

## 2012-11-22 MED ORDER — ADULT MULTIVITAMIN LIQUID CH
10.0000 mL | ORAL | Status: DC
  Administered 2012-11-23 – 2012-11-28 (×6): 10 mL
  Filled 2012-11-22 (×7): qty 10

## 2012-11-22 MED ORDER — FREE WATER
250.0000 mL | Freq: Three times a day (TID) | Status: DC
  Administered 2012-11-23 – 2012-11-25 (×11): 250 mL

## 2012-11-22 MED ORDER — SODIUM CHLORIDE 0.9 % IN NEBU
INHALATION_SOLUTION | RESPIRATORY_TRACT | Status: AC
  Administered 2012-11-22: 3 mL
  Filled 2012-11-22: qty 3

## 2012-11-22 MED ORDER — PIPERACILLIN-TAZOBACTAM 3.375 G IVPB
3.3750 g | Freq: Three times a day (TID) | INTRAVENOUS | Status: DC
  Administered 2012-11-22 – 2012-11-29 (×21): 3.375 g via INTRAVENOUS
  Filled 2012-11-22 (×26): qty 50

## 2012-11-22 MED ORDER — ENSURE PLUS PO LIQD
237.0000 mL | Freq: Four times a day (QID) | ORAL | Status: DC
  Administered 2012-11-22 – 2012-11-23 (×3): 237 mL via ORAL
  Filled 2012-11-22 (×9): qty 237

## 2012-11-22 MED ORDER — NONFORMULARY OR COMPOUNDED ITEM
5.0000 mL | Freq: Every day | Status: DC
  Administered 2012-11-23 – 2012-11-28 (×7): 5 mL
  Filled 2012-11-22: qty 1

## 2012-11-22 MED ORDER — SODIUM CHLORIDE 0.9 % IJ SOLN
10.0000 mL | INTRAMUSCULAR | Status: DC | PRN
  Administered 2012-11-29: 10 mL

## 2012-11-22 NOTE — Progress Notes (Signed)
PULMONARY  / CRITICAL CARE MEDICINE  Name: Jeffery [name] MRN: [id number] DOB: 01-24-1991    ADMISSION DATE:  11/21/2012 CONSULTATION DATE:  11/21/2012  REFERRING MD :  EDP PRIMARY SERVICE:  PCCM  CHIEF COMPLAINT:  Fever  BRIEF PATIENT DESCRIPTION: 22 yo with past medical history of cerebral palsy and qudriplegia brought to ED with low grade fever, tachycardia, sweats and agitation since one day PTA.  There was no cough or witnessed aspiration. Mother also reports persistent diarrhea for several month with workup including several Clostridium Difficile PCR and colon biopsy being non revealing.  He was treated imperically with Flagyl.  SIGNIFICANT EVENTS / STUDIES:  10/21  Brought in with low grade fever, tachycardia and agitation 10/21  CT abdomen / pelvis >>> Patchy airspace disease RLL, improved compared to previous imaging, distended bladder, resolved colon thickening 10/21  GB US >>>  LINES / TUBES: PIV  CULTURES: 10/21 Blood >>> 01/21 Urine >>>  ANTIBIOTICS: Levaqin 10/21 >>>10/22 Vancomycin 10/21>>> Zosyn 10/22>>>  INTERVAL HISTORY: No events overnight, hemodynamically stable.  VITAL SIGNS: Temp:  [97.3 F (36.3 C)-99.2 F (37.3 C)] 97.3 F (36.3 C) (10/22 0855) Pulse Rate:  [96-142] 102 (10/22 0855) Resp:  [0-28] 12 (10/22 0855) BP: (88-154)/(42-121) 100/56 mmHg (10/22 0855) SpO2:  [88 %-99 %] 92 % (10/22 0941) FiO2 (%):  [50 %-100 %] 50 % (10/22 0855) Weight:  [48.9 kg (107 lb 12.9 oz)] 48.9 kg (107 lb 12.9 oz) (10/22 0000)  HEMODYNAMICS:   VENTILATOR SETTINGS: Vent Mode:  [-]  FiO2 (%):  [50 %-100 %] 50 %  INTAKE / OUTPUT: Intake/Output     10/21 0701 - 10/22 0700 10/22 0701 - 10/23 0700   I.V. (mL/kg) 1243.3 (25.4)    NG/GT 460    Total Intake(mL/kg) 1703.3 (34.8)    Urine (mL/kg/hr)  1 (0)   Total Output   1   Net +1703.3 -1        Urine Occurrence 2 x 1 x     PHYSICAL EXAMINATION: General:  No acute distress Neuro:  Nonverbal, bilateral  upper and lower extremities contractures HEENT:  PERRL, dry membraanes Cardiovascular:  Regula, tachycardic Lungs:  Bilateral air entry, scattered rhonchi Abdomen:  Soft, nontender, bowel sounds diminished, PEG Musculoskeletal:  No edema Skin:  Fungal rash neck / upper torso  LABS:  Recent Labs Lab 11/21/12 1454 11/21/12 2224 11/22/12 0515  HGB 17.9*  --  14.7  WBC 11.2*  --  7.3  PLT 126*  --  115*  NA 137  --  133*  K 4.1  --  3.8  CL 98  --  98  CO2 23  --  25  GLUCOSE 78  --  116*  BUN 20  --  11  CREATININE 0.48*  --  0.53  CALCIUM 9.6  --  8.3*  MG  --   --  2.1  PHOS  --   --  3.4  AST 69*  --  39*  ALT 110*  --  70*  ALKPHOS 183*  --  149*  BILITOT 1.1  --  1.5*  PROT 8.1  --  6.1  ALBUMIN 4.0  --  3.0*  PROCALCITON  --  <0.10 <0.10   Recent Labs Lab 11/22/12 0435 11/22/12 0837  GLUCAP 116* 70    CXR:  10/21 >>> No overt airspace disease  ASSESSMENT / PLAN:  Acute febrile illness Elevated liver enzymes Dehydration Urine retention Fungal dermatitis 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 --> Start vanc/zosyn. --> BiPAP as needed as RR is in the 40's at this point. --> In / out cath --> PICC placement pending. --> NS 500 x 1 given, will decrease IVF to 50 ml/hr of NS. --> Follow GB US --> Mycostatin topical --> Heparin for DVT Px --> Check C. Diff again. --> Titrate O2 for sats.  I have personally obtained history, examined patient, evaluated and interpreted laboratory and imaging results, reviewed medical records, formulated assessment / plan and placed orders.  Alyson Reedy, M.D. [facility] Pulmonary/Critical Care Medicine. Pager: [phone]. After hours pager: [phone].

## 2012-11-22 NOTE — Progress Notes (Signed)
Utilization Review Completed.  

## 2012-11-22 NOTE — Progress Notes (Signed)
Pt refused CPT 

## 2012-11-22 NOTE — Progress Notes (Signed)
Peripherally Inserted Central Catheter/Midline Placement  The IV Nurse has discussed with the patient and/or persons authorized to consent for the patient, the purpose of this procedure and the potential benefits and risks involved with this procedure.  The benefits include less needle sticks, lab draws from the catheter and patient may be discharged home with the catheter.  Risks include, but not limited to, infection, bleeding, blood clot (thrombus formation), and puncture of an artery; nerve damage and irregular heat beat.  Alternatives to this procedure were also discussed.  PICC/Midline Placement Documentation        Dorena Bodo 11/22/2012, 6:24 PM

## 2012-11-22 NOTE — Progress Notes (Signed)
Family refused CPT.  RT will continue to monitor.

## 2012-11-22 NOTE — Progress Notes (Signed)
Family refused patient's CPT at this time.  RT will continue to monitor.

## 2012-11-22 NOTE — Progress Notes (Signed)
Pt refused CPT at this time.

## 2012-11-22 NOTE — Progress Notes (Signed)
ANTIBIOTIC CONSULT NOTE - INITIAL  Pharmacy Consult for vancomycin/zosyn Indication: rule out pneumonia  Allergies  Allergen Reactions  . Adhesive [Tape]     Rips skin off  . Depakote [Divalproex Sodium]     Causes pancreatitis     Patient Measurements: Height: 4' 11.5" (151.1 cm) Weight: 107 lb 12.9 oz (48.9 kg) IBW/kg (Calculated) : 48.85 Adjusted Body Weight:   Vital Signs: Temp: 99.4 F (37.4 C) (10/22 1157) Temp src: Oral (10/22 1157) BP: 100/52 mmHg (10/22 1500) Pulse Rate: 107 (10/22 1500) Intake/Output from previous day: 10/21 0701 - 10/22 0700 In: 1703.3 [I.V.:1243.3; NG/GT:460] Out: -  Intake/Output from this shift: Total I/O In: 1050 [I.V.:800; NG/GT:200; IV Piggyback:50] Out: 1 [Urine:1]  Labs:  Recent Labs  11/21/12 1454 11/22/12 0515  WBC 11.2* 7.3  HGB 17.9* 14.7  PLT 126* 115*  CREATININE 0.48* 0.53   Estimated Creatinine Clearance: 100.2 ml/min (by C-G formula based on Cr of 0.53). No results found for this basename: VANCOTROUGH, VANCOPEAK, VANCORANDOM, GENTTROUGH, GENTPEAK, GENTRANDOM, TOBRATROUGH, TOBRAPEAK, TOBRARND, AMIKACINPEAK, AMIKACINTROU, AMIKACIN,  in the last 72 hours   Microbiology: Recent Results (from the past 720 hour(s))  CULTURE, BLOOD (ROUTINE X 2)     Status: None   Collection Time    11/04/12  4:00 PM      Result Value Range Status   Specimen Description BLOOD LEFT HAND   Final   Special Requests BOTTLES DRAWN AEROBIC AND ANAEROBIC 10CC EACH   Final   Culture  Setup Time     Final   Value: 11/04/2012 23:12     Performed at [facility]   Culture     Final   Value: NO GROWTH 5 DAYS     Performed at [facility]   Report Status 11/10/2012 FINAL   Final  CULTURE, BLOOD (ROUTINE X 2)     Status: None   Collection Time    11/04/12  4:15 PM      Result Value Range Status   Specimen Description BLOOD LEFT HAND   Final   Special Requests BOTTLES DRAWN AEROBIC AND ANAEROBIC 10CC EACH   Final   Culture   Setup Time     Final   Value: 11/04/2012 23:12     Performed at [facility]   Culture     Final   Value: NO GROWTH 5 DAYS     Performed at [facility]   Report Status 11/10/2012 FINAL   Final  URINE CULTURE     Status: None   Collection Time    11/04/12  4:49 PM      Result Value Range Status   Specimen Description URINE, RANDOM   Final   Special Requests NONE   Final   Culture  Setup Time     Final   Value: 11/05/2012 03:03     Performed at [facility] Count     Final   Value: NO GROWTH     Performed at [facility]   Culture     Final   Value: NO GROWTH     Performed at [facility]   Report Status 11/06/2012 FINAL   Final  MRSA PCR SCREENING     Status: None   Collection Time    11/21/12 11:05 PM      Result Value Range Status   MRSA by PCR NEGATIVE  NEGATIVE Final   Comment:  The GeneXpert MRSA Assay (FDA     approved for NASAL specimens     only), is one component of a     comprehensive MRSA colonization     surveillance program. It is not     intended to diagnose MRSA     infection nor to guide or     monitor treatment for     MRSA infections.  CLOSTRIDIUM DIFFICILE BY PCR     Status: None   Collection Time    11/22/12 12:04 PM      Result Value Range Status   C difficile by pcr NEGATIVE  NEGATIVE Final    Medical History: Past Medical History  Diagnosis Date  . CP (cerebral palsy), spastic, quadriplegic   . Epilepsy   . Osteoporosis   . Inguinal hernia   . Undescended testes   . Seasonal allergies   . IVH (intraventricular hemorrhage)     Grade IV  . Hip dislocation, bilateral   . Seizures   . Hx: UTI (urinary tract infection)   . Dysphagia   . Otitis media   . Retinopathy of prematurity   . 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 Neuromuscular scoliosis   . Osteoporosis   . Complex partial seizures   . Generalized convulsive epilepsy without mention of intractable epilepsy   .  Epilepsy   . Sinus bradycardia     HR drops to 38-40 while sleeping  . Sleep apnea     BiPAP  . Blister of right heel     fluid filled; origin unknown  . Kidney stones     ?  Marland Kitchen Pneumonia      chronic pneumonia ,respitory failure dx Augest 2014    Assessment: 22 yo with past medical history of cerebral palsy and qudriplegia brought to ED with low grade fever, tachycardia, sweats and agitation since one day PTA. Patient with suspected pna to start on broad abx. Will need to be cautious with dosing given cp and quadraplegia.   Goal of Therapy:  Vancomycin trough level 15-20 mcg/ml  Plan:  Measure antibiotic drug levels at steady state Follow up culture results Vancomycin 500mg  IV q12 Zosyn 3.375g IV q8 hours  Severiano Gilbert 11/22/2012,4:27 PM

## 2012-11-23 DIAGNOSIS — R197 Diarrhea, unspecified: Secondary | ICD-10-CM

## 2012-11-23 LAB — CBC
Platelets: 100 10*3/uL — ABNORMAL LOW (ref 150–400)
RBC: 3.92 MIL/uL — ABNORMAL LOW (ref 4.22–5.81)
WBC: 4.1 10*3/uL (ref 4.0–10.5)

## 2012-11-23 LAB — BASIC METABOLIC PANEL
CO2: 30 mEq/L (ref 19–32)
Chloride: 101 mEq/L (ref 96–112)
Potassium: 3.4 mEq/L — ABNORMAL LOW (ref 3.5–5.1)
Sodium: 138 mEq/L (ref 135–145)

## 2012-11-23 LAB — PROCALCITONIN: Procalcitonin: <0.1 ng/mL

## 2012-11-23 LAB — MAGNESIUM: Magnesium: 2 mg/dL (ref 1.5–2.5)

## 2012-11-23 LAB — PHOSPHORUS: Phosphorus: 3.5 mg/dL (ref 2.3–4.6)

## 2012-11-23 MED ORDER — PANTOPRAZOLE SODIUM 40 MG PO PACK
40.0000 mg | PACK | Freq: Every day | ORAL | Status: DC
  Administered 2012-11-24 – 2012-11-25 (×2): 40 mg
  Filled 2012-11-23 (×4): qty 20

## 2012-11-23 MED ORDER — ENSURE PLUS PO LIQD
237.0000 mL | Freq: Four times a day (QID) | ORAL | Status: DC
  Administered 2012-11-23 – 2012-11-25 (×10): 237 mL
  Administered 2012-11-26: 08:00:00
  Administered 2012-11-26 – 2012-11-29 (×11): 237 mL
  Filled 2012-11-23 (×29): qty 237

## 2012-11-23 NOTE — Progress Notes (Signed)
**Note De-Identified Saidah Kempton Obfuscation** RT note: RN removed patient from BIPAP and placed on 50% VM; patient tolerating well.  SAT 100%, family refused CPT

## 2012-11-23 NOTE — Progress Notes (Signed)
INITIAL NUTRITION ASSESSMENT  DOCUMENTATION CODES Per approved criteria  -Not Applicable   INTERVENTION:  Continue current EN regimen  Continue liquid multivitamin daily via tube RD to follow for nutrition care plan  NUTRITION DIAGNOSIS: Inadequate oral intake related to inability to eat as evidenced by home EN support  Goal: EN to meet > 90% of estimated nutrition needs  Monitor:  EN regimen & tolerance, weight, labs, I/O's  Reason for Assessment: New Tube Feeding, Low Braden  22 y.o. male  Admitting Dx: fever  ASSESSMENT: Patient with PMH of cerebral palsy and quadriplegia brought to ED with low grade fever, tachycardia, sweats and agitation; also has been having persistent diarrhea.  Patient known to this RD with previous admission; receives 4 cans of Ensure Plus with Prostat liquid protein 30 ml twice daily via PEG tube which provides 1600 total kcals, 82 gm protein, 720 ml of free water; free water flushes at 250 ml 3 times daily.  Also receiving liquid MVI daily via tube.  Nurse Tech re-weighed patient at 110 lbs -- weight down -- more than likely given dehydration.  Height: Ht Readings from Last 1 Encounters:  11/22/12 4' 11.5" (1.511 m)    Weight: Wt Readings from Last 1 Encounters:  11/22/12 107 lb 12.9 oz (48.9 kg)    Ideal Body Weight: 98 lb  % Ideal Body Weight: 109%  Wt Readings from Last 10 Encounters:  11/22/12 107 lb 12.9 oz (48.9 kg)  11/07/12 116 lb (52.617 kg)  10/30/12 116 lb (52.617 kg)  10/30/12 116 lb (52.617 kg)  10/05/12 116 lb (52.617 kg)  09/22/12 105 lb 13.1 oz (48 kg)  09/13/12 120 lb (54.432 kg)  09/13/12 120 lb (54.432 kg)  04/21/12 120 lb (54.432 kg)    Usual Body Weight: 116 lb  % Usual Body Weight: 92%  BMI:  Body mass index is 21.42 kg/(m^2).  Estimated Nutritional Needs: Kcal: 1300-1500 Protein: 75-85 gm Fluid: >/= 1.5 L  Skin: Intact  Diet Order: NPO  EDUCATION NEEDS: -No education needs identified at  this time   Intake/Output Summary (Last 24 hours) at 11/23/12 1317 Last data filed at 11/23/12 1240  Gross per 24 hour  Intake   4017 ml  Output      3 ml  Net   4014 ml    Labs:   Recent Labs Lab 11/21/12 1454 11/22/12 0515 11/23/12 0500  NA 137 133* 138  K 4.1 3.8 3.4*  CL 98 98 101  CO2 23 25 30   BUN 20 11 10   CREATININE 0.48* 0.53 0.55  CALCIUM 9.6 8.3* 8.4  MG  --  2.1 2.0  PHOS  --  3.4 3.5  GLUCOSE 78 116* 89    CBG (last 3)   Recent Labs  11/22/12 0837 11/22/12 1201 11/22/12 1648  GLUCAP 70 119* 103*    Scheduled Meds: . albuterol  2.5 mg Nebulization Q4H  . ascorbic acid  500 mg Per Tube QHS  . calcium carbonate (dosed in mg elemental calcium)  500 mg of elemental calcium Per Tube BID  . ENSURE PLUS  237 mL Oral QID  . feeding supplement (PRO-STAT SUGAR FREE 64)  30 mL Per Tube BID  . folic acid  1 mg Oral Daily  . free water  250 mL Per Tube TID AC & HS  . gabapentin  800 mg Oral TID  . heparin subcutaneous  5,000 Units Subcutaneous Q8H  . levETIRAcetam  1,750 mg Per Tube BID  .  multivitamin  10 mL Per Tube Q24H  . nystatin cream   Topical BID  . [START ON 11/24/2012] pantoprazole sodium  40 mg Per Tube Daily  . piperacillin-tazobactam (ZOSYN)  IV  3.375 g Intravenous Q8H  . sodium chloride  10-40 mL Intracatheter Q12H  . sucralfate  1 g Per Tube 5 X Daily  . vancomycin  500 mg Intravenous Q12H  . Zinc 244mg /67ml (patient's own medication)  5 mL Per Tube Q2000    Continuous Infusions: . sodium chloride 100 mL/hr at 11/22/12 0723  . sodium chloride    . baclofen      Past Medical History  Diagnosis Date  . CP (cerebral palsy), spastic, quadriplegic   . Epilepsy   . Osteoporosis   . Inguinal hernia   . Undescended testes   . Seasonal allergies   . IVH (intraventricular hemorrhage)     Grade IV  . Hip dislocation, bilateral   . Seizures   . Hx: UTI (urinary tract infection)   . Dysphagia   . Otitis media   . Retinopathy of  prematurity   . 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 Neuromuscular scoliosis   . Osteoporosis   . Complex partial seizures   . Generalized convulsive epilepsy without mention of intractable epilepsy   . Epilepsy   . Sinus bradycardia     HR drops to 38-40 while sleeping  . Sleep apnea     BiPAP  . Blister of right heel     fluid filled; origin unknown  . Kidney stones     ?  Marland Kitchen Pneumonia      chronic pneumonia ,respitory failure dx Augest 2014    Past Surgical History  Procedure Laterality Date  . Excision of moles    . Peg placement      With multiple changes  . Gastrostomy tube, change / reposition  12/15/2010       . Button change  12/15/2010    Procedure: BUTTON CHANGE;  Surgeon: Iva Boop, MD;  Location: [facility];  Service: Endoscopy;  Laterality: N/A;  . Peg placement  10/07/2011    Procedure: PERCUTANEOUS ENDOSCOPIC GASTROSTOMY (PEG) REPLACEMENT;  Surgeon: Hart Carwin, MD;  Location: [facility];  Service: Endoscopy;  Laterality: N/A;  Needs 18 F 2.5 button ordered-dl  . Inguinal hernia repair Bilateral 1992  . Retinopathy of prematurity surgery  1992  . Achilles tendon lengthening  12/1998  . Soft tissue releases  wrists and fingers  12/1998  . Intrathecal baclofen pump placement  07/25/2000  . Peg placement N/A 06/05/2012    Procedure: PERCUTANEOUS ENDOSCOPIC GASTROSTOMY (PEG) REPLACEMENT;  Surgeon: Hart Carwin, MD;  Location: [facility];  Service: Endoscopy;  Laterality: N/A;  button 64fr.2.5cm  . Eye surgery Bilateral     Retinal   . Back surgery      Harrington Rods in back needs to be log rolled  . Tendon repair Bilateral 09/05/2012    Procedure: LENGTHENING OF DIGITAL FLEXOR TENDONS BILTERAL HANDS;  Surgeon: Jodi Marble, MD;  Location: [facility] OR;  Service: Orthopedics;  Laterality: Bilateral;  . Hand surgery Bilateral Aug. 2014  . Peg placement N/A 09/13/2012    Procedure: PERCUTANEOUS ENDOSCOPIC GASTROSTOMY (PEG) REPLACEMENT;  Surgeon: Hart Carwin, MD;  Location: [facility];  Service: Endoscopy;  Laterality: N/A;  . Flexible sigmoidoscopy N/A 10/30/2012    Procedure: FLEXIBLE SIGMOIDOSCOPY;  Surgeon: Florencia Reasons, MD;  Location: [facility];  Service: Endoscopy;  Laterality:  N/A;  . Peg placement N/A 11/15/2012    Procedure: PERCUTANEOUS ENDOSCOPIC GASTROSTOMY (PEG) REPLACEMENT;  Surgeon: Petra Kuba, MD;  Location: [facility] ENDOSCOPY;  Service: Endoscopy;  Laterality: N/A;    Maureen Chatters, RD, LDN Pager #: [phone] After-Hours Pager #: [phone]

## 2012-11-23 NOTE — Progress Notes (Signed)
**Note De-Identified Lyon Dumont Obfuscation** RT note: family refuses chest vest at this time, but in hopes of starting 10/24 AM once the patient is able to sit-up in chair

## 2012-11-23 NOTE — Progress Notes (Signed)
Spoke with mother regarding medication administration. Mother states that she has been allowed in the past to give medicines via PEG tube with nurse supervision when pt admitted at [facility] in the past.  Provided policy information to explain that is against hospital policy.  Left note for attending MD to address issues and concerns upon rounding on 11/24/12.

## 2012-11-23 NOTE — Progress Notes (Signed)
PULMONARY  / CRITICAL CARE MEDICINE  Name: Jeffery [name] MRN: [id number] DOB: [birth year]    ADMISSION DATE:  11/21/2012 CONSULTATION DATE:  11/21/2012  REFERRING MD :  EDP PRIMARY SERVICE:  PCCM  CHIEF COMPLAINT:  Fever  BRIEF PATIENT DESCRIPTION: 22 yo with past medical history of cerebral palsy and qudriplegia brought to ED with low grade fever, tachycardia, sweats and agitation since one day PTA.  There was no cough or witnessed aspiration. Mother also reports persistent diarrhea for several month with workup including several Clostridium Difficile PCR and colon biopsy being non revealing.  He was treated imperically with Flagyl.  SIGNIFICANT EVENTS / STUDIES:  10/21  Brought in with low grade fever, tachycardia and agitation 10/21  CT abdomen / pelvis >>> Patchy airspace disease RLL, improved compared to previous imaging, distended bladder, resolved colon thickening 10/21  GB US >>>  LINES / TUBES: PIV  CULTURES: 10/21 Blood >>> 01/21 Urine >>>  ANTIBIOTICS: Levaqin 10/21 >>>10/22 Vancomycin 10/21>>> Zosyn 10/22>>>  INTERVAL HISTORY: No events overnight, hemodynamically stable.  VITAL SIGNS: Temp:  [98.4 F (36.9 C)-100.5 F (38.1 C)] 98.4 F (36.9 C) (10/23 0800) Pulse Rate:  [70-142] 101 (10/23 1200) Resp:  [15-27] 21 (10/23 1200) BP: (93-130)/(47-85) 105/53 mmHg (10/23 1200) SpO2:  [93 %-100 %] 93 % (10/23 1200) FiO2 (%):  [40 %-80 %] 40 % (10/23 1200)  HEMODYNAMICS:   VENTILATOR SETTINGS: Vent Mode:  [-]  FiO2 (%):  [40 %-80 %] 40 %  INTAKE / OUTPUT: Intake/Output     10/22 0701 - 10/23 0700 10/23 0701 - 10/24 0700   I.V. (mL/kg) 2420 (49.5) 500 (10.2)   Other  610   NG/GT 200 250   IV Piggyback 400 100   Total Intake(mL/kg) 3020 (61.8) 1460 (29.9)   Urine (mL/kg/hr) 2 (0)    Total Output 2     Net +3018 +1460        Urine Occurrence 4 x 3 x   Stool Occurrence 3 x 1 x    PHYSICAL EXAMINATION: General:  No acute distress Neuro:  Nonverbal,  bilateral upper and lower extremities contractures HEENT:  PERRL, dry membraanes Cardiovascular:  Regula, tachycardic Lungs:  Bilateral air entry, scattered rhonchi Abdomen:  Soft, nontender, bowel sounds diminished, PEG Musculoskeletal:  No edema Skin:  Fungal rash neck / upper torso  LABS:  Recent Labs Lab 11/21/12 1454 11/21/12 2224 11/22/12 0515 11/23/12 0500  HGB 17.9*  --  14.7 12.7*  WBC 11.2*  --  7.3 4.1  PLT 126*  --  115* 100*  NA 137  --  133* 138  K 4.1  --  3.8 3.4*  CL 98  --  98 101  CO2 23  --  25 30  GLUCOSE 78  --  116* 89  BUN 20  --  11 10  CREATININE 0.48*  --  0.53 0.55  CALCIUM 9.6  --  8.3* 8.4  MG  --   --  2.1 2.0  PHOS  --   --  3.4 3.5  AST 69*  --  39*  --   ALT 110*  --  70*  --   ALKPHOS 183*  --  149*  --   BILITOT 1.1  --  1.5*  --   PROT 8.1  --  6.1  --   ALBUMIN 4.0  --  3.0*  --   PROCALCITON  --  <0.10 <0.10 <0.10    Recent Labs Lab 11/22/12 0435  11/22/12 0837 11/22/12 1201 11/22/12 1648  GLUCAP 116* 70 119* 103*    CXR:  10/21 >>> No overt airspace disease  ASSESSMENT / PLAN:  Acute febrile illness Elevated liver enzymes Dehydration Urine retention Fungal dermatitis Cerebral palsy with quadriplegia and seizure disorder Dysphagia s/p PEG OSA  --> Continue preadmission medications / therapies --> Blood cx, urine cx pending --> PNA on CT RLL, likely aspiration --> D/C levaquin. --> Continue vanc/zosyn. --> BiPAP QHS. --> In/out cath. --> D/C IVF. --> Follow GB US noted. --> Mycostatin topical. --> Heparin refused by patient's mother, aware of risks. --> Check C. Diff negative. --> Titrate O2 for sats.  I have personally obtained history, examined patient, evaluated and interpreted laboratory and imaging results, reviewed medical records, formulated assessment / plan and placed orders.  Jeffery [name], M.D. [facility] Pulmonary/Critical Care Medicine. Pager: [phone]. After hours pager: [phone].

## 2012-11-23 NOTE — Progress Notes (Signed)
**Note De-Identified Shadee Montoya Obfuscation** RT note: family refusing Chest vest.

## 2012-11-24 ENCOUNTER — Telehealth: Payer: Self-pay | Admitting: *Deleted

## 2012-11-24 ENCOUNTER — Inpatient Hospital Stay (HOSPITAL_COMMUNITY): Payer: Medicaid Other

## 2012-11-24 LAB — GLUCOSE, CAPILLARY
Glucose-Capillary: 97 mg/dL (ref 70–99)
Glucose-Capillary: 113 mg/dL — ABNORMAL HIGH (ref 70–99)
Glucose-Capillary: 103 mg/dL — ABNORMAL HIGH (ref 70–99)
Glucose-Capillary: 125 mg/dL — ABNORMAL HIGH (ref 70–99)

## 2012-11-24 LAB — VANCOMYCIN, TROUGH: Vancomycin Tr: 6.1 ug/mL — ABNORMAL LOW (ref 10.0–20.0)

## 2012-11-24 MED ORDER — VANCOMYCIN HCL IN DEXTROSE 750-5 MG/150ML-% IV SOLN
750.0000 mg | Freq: Three times a day (TID) | INTRAVENOUS | Status: DC
  Administered 2012-11-24: 750 mg via INTRAVENOUS
  Filled 2012-11-24 (×2): qty 150

## 2012-11-24 MED ORDER — VANCOMYCIN HCL IN DEXTROSE 750-5 MG/150ML-% IV SOLN
750.0000 mg | Freq: Three times a day (TID) | INTRAVENOUS | Status: DC
  Filled 2012-11-24 (×2): qty 150

## 2012-11-24 MED ORDER — POTASSIUM CHLORIDE 20 MEQ/15ML (10%) PO LIQD
40.0000 meq | Freq: Once | ORAL | Status: AC
  Administered 2012-11-24: 40 meq
  Filled 2012-11-24: qty 30

## 2012-11-24 NOTE — Significant Event (Signed)
Mother expressed concern re: AMS. Discussed with Dr Molli Knock who has asked that I place order for head CT   Billy Fischer, MD ; [facility] service [place] [phone].  After 5:30 PM or weekends, call [phone]

## 2012-11-24 NOTE — Progress Notes (Addendum)
**Note De-Identified Jeffery [name] Obfuscation** RT note: patient removed from BIPAP to 2L [place]; SAT 96%, BBS clr/dim. RT to cont. To monitor. Family refusing chest vest at this time.

## 2012-11-24 NOTE — Progress Notes (Signed)
ANTIBIOTIC CONSULT NOTE - Follow-up  Pharmacy Consult for vancomycin/zosyn Indication: rule out pneumonia  Allergies  Allergen Reactions  . Adhesive [Tape]     Rips skin off  . Depakote [Divalproex Sodium]     Causes pancreatitis     Patient Measurements: Height: 4' 11.5" (151.1 cm) Weight: 115 lb 4.8 oz (52.3 kg) IBW/kg (Calculated) : 48.85 Adjusted Body Weight:   Vital Signs: BP: 97/55 mmHg (10/23 2332) Pulse Rate: 73 (10/23 2332) Intake/Output from previous day: 10/23 0701 - 10/24 0700 In: 2897 [I.V.:570; NG/GT:560; IV Piggyback:150] Out: 1 [Urine:1] Intake/Output from this shift:    Labs:  Recent Labs  11/21/12 1454 11/22/12 0515 11/23/12 0500  WBC 11.2* 7.3 4.1  HGB 17.9* 14.7 12.7*  PLT 126* 115* 100*  CREATININE 0.48* 0.53 0.55   Estimated Creatinine Clearance: 100.2 ml/min (by C-G formula based on Cr of 0.55).  Recent Labs  11/24/12 0730  VANCOTROUGH 6.1*     Microbiology: Recent Results (from the past 720 hour(s))  CULTURE, BLOOD (ROUTINE X 2)     Status: None   Collection Time    11/04/12  4:00 PM      Result Value Range Status   Specimen Description BLOOD LEFT HAND   Final   Special Requests BOTTLES DRAWN AEROBIC AND ANAEROBIC 10CC EACH   Final   Culture  Setup Time     Final   Value: 11/04/2012 23:12     Performed at [facility]   Culture     Final   Value: NO GROWTH 5 DAYS     Performed at [facility]   Report Status 11/10/2012 FINAL   Final  CULTURE, BLOOD (ROUTINE X 2)     Status: None   Collection Time    11/04/12  4:15 PM      Result Value Range Status   Specimen Description BLOOD LEFT HAND   Final   Special Requests BOTTLES DRAWN AEROBIC AND ANAEROBIC 10CC EACH   Final   Culture  Setup Time     Final   Value: 11/04/2012 23:12     Performed at [facility]   Culture     Final   Value: NO GROWTH 5 DAYS     Performed at [facility]   Report Status 11/10/2012 FINAL   Final  URINE CULTURE      Status: None   Collection Time    11/04/12  4:49 PM      Result Value Range Status   Specimen Description URINE, RANDOM   Final   Special Requests NONE   Final   Culture  Setup Time     Final   Value: 11/05/2012 03:03     Performed at [facility] Count     Final   Value: NO GROWTH     Performed at [facility]   Culture     Final   Value: NO GROWTH     Performed at [facility]   Report Status 11/06/2012 FINAL   Final  CULTURE, BLOOD (ROUTINE X 2)     Status: None   Collection Time    11/21/12 10:16 PM      Result Value Range Status   Specimen Description BLOOD WRIST LEFT   Final   Special Requests BOTTLES DRAWN AEROBIC ONLY [facility]   Final   Culture  Setup Time     Final   Value: 11/22/2012 05:55     Performed  at [facility]     Final   Value:        BLOOD CULTURE RECEIVED NO GROWTH TO DATE CULTURE WILL BE HELD FOR 5 DAYS BEFORE ISSUING A FINAL NEGATIVE REPORT     Performed at [facility]   Report Status PENDING   Incomplete  CULTURE, BLOOD (ROUTINE X 2)     Status: None   Collection Time    11/21/12 10:23 PM      Result Value Range Status   Specimen Description BLOOD HAND LEFT   Final   Special Requests BOTTLES DRAWN AEROBIC ONLY 10CC   Final   Culture  Setup Time     Final   Value: 11/22/2012 05:55     Performed at [facility]   Culture     Final   Value:        BLOOD CULTURE RECEIVED NO GROWTH TO DATE CULTURE WILL BE HELD FOR 5 DAYS BEFORE ISSUING A FINAL NEGATIVE REPORT     Performed at [facility]   Report Status PENDING   Incomplete  MRSA PCR SCREENING     Status: None   Collection Time    11/21/12 11:05 PM      Result Value Range Status   MRSA by PCR NEGATIVE  NEGATIVE Final   Comment:            The GeneXpert MRSA Assay (FDA     approved for NASAL specimens     only), is one component of a     comprehensive MRSA colonization     surveillance program. It is not     intended to  diagnose MRSA     infection nor to guide or     monitor treatment for     MRSA infections.  CLOSTRIDIUM DIFFICILE BY PCR     Status: None   Collection Time    11/22/12 12:04 PM      Result Value Range Status   C difficile by pcr NEGATIVE  NEGATIVE Final    Medical History: Past Medical History  Diagnosis Date  . CP (cerebral palsy), spastic, quadriplegic   . Epilepsy   . Osteoporosis   . Inguinal hernia   . Undescended testes   . Seasonal allergies   . IVH (intraventricular hemorrhage)     Grade IV  . Hip dislocation, bilateral   . Seizures   . Hx: UTI (urinary tract infection)   . Dysphagia   . Otitis media   . Retinopathy of prematurity   . 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 Neuromuscular scoliosis   . Osteoporosis   . Complex partial seizures   . Generalized convulsive epilepsy without mention of intractable epilepsy   . Epilepsy   . Sinus bradycardia     HR drops to 38-40 while sleeping  . Sleep apnea     BiPAP  . Blister of right heel     fluid filled; origin unknown  . Kidney stones     ?  Marland Kitchen Pneumonia      chronic pneumonia ,respitory failure dx Augest 2014    Assessment: 22 yo male on Vancomycin and Zosyn Day#4 for pna, likely asp. Afeb. WBC wnl. PCT neg. Vancomycin trough 6.1 mcg/ml - subtherapeutic - on 500mg  IV q12h. SCr remains stable but not indicative of renal function in quadriplegic patient. I/O's inconsistently documented.    10/22 vanc>>   10/22 zosyn>>   10/21 levaquin>>10/23  10/21 Nystatin cream to chest/neck BID>>    10/22 - blood x 2 - ngtd   10/22 - C diff PCR - neg   10/21 - MRSA screen neg   10/21 - urine - no sample yet, but UA neg   Goal of Therapy:  Vancomycin trough level 15-20 mcg/ml  Plan:   1) Change Vancomycin to 750 mg IV q8h. Trough at new CSS if Vanc continues in pt with cerebral palsy and quadriplegia.  2) Continue Zosyn 3.375 gm q8h (each over 4 hrs)  3) Will f/u cx, renal function, pt's clinical  condition  4) Watch PLT on SQ heparin  Christoper Fabian, PharmD, BCPS Clinical pharmacist, pager [phone] 11/24/2012,9:13 AM

## 2012-11-24 NOTE — Telephone Encounter (Signed)
[place] [facility] the patient's mom has called crying and stating she wants Dr. [name] to come to the hospital to see about the patient that he is crying in pain and she feels that the doctors are not listening to her or helping Jeffery [name], I informed mom that Dr. [name] was out of the office until Monday and she stated that she needs help with this problem and she would like for Dr. Merri Brunette to give her a call. [place] [facility] can be reached at [phone].     Thanks,  Jeffery [name].

## 2012-11-24 NOTE — Progress Notes (Signed)
PULMONARY  / CRITICAL CARE MEDICINE  Name: Jeffery [name] MRN: [id number] DOB: 09-15-1990    ADMISSION DATE:  11/21/2012 CONSULTATION DATE:  11/21/2012  REFERRING MD :  EDP PRIMARY SERVICE:  PCCM  CHIEF COMPLAINT:  Fever  BRIEF PATIENT DESCRIPTION: 22 yo with past medical history of cerebral palsy and qudriplegia brought to ED with low grade fever, tachycardia, sweats and agitation since one day PTA.  There was no cough or witnessed aspiration. Mother also reports persistent diarrhea for several month with workup including several Clostridium Difficile PCR and colon biopsy being non revealing.  He was treated imperically with Flagyl.  SIGNIFICANT EVENTS / STUDIES:  10/21  Brought in with low grade fever, tachycardia and agitation 10/21  CT abdomen / pelvis >>> Patchy airspace disease RLL, improved compared to previous imaging, distended bladder, resolved colon thickening 10/21  GB US >>>  LINES / TUBES: PIV  CULTURES: 10/21 Blood >>>NTD 01/21 Urine >>>NTD  ANTIBIOTICS: Levaqin 10/21 >>>10/22 Vancomycin 10/21>>>10/24 Zosyn 10/22>>>  INTERVAL HISTORY: No events overnight, much more comfortable on [place].  VITAL SIGNS: Temp:  [97.9 F (36.6 C)-98.9 F (37.2 C)] 98.1 F (36.7 C) (10/24 1115) Pulse Rate:  [57-106] 106 (10/24 1115) Resp:  [11-20] 19 (10/24 1115) BP: (88-117)/(39-73) 106/60 mmHg (10/24 1115) SpO2:  [91 %-100 %] 98 % (10/24 1115) FiO2 (%):  [35 %] 35 % (10/23 1946) Weight:  [52.3 kg (115 lb 4.8 oz)] 52.3 kg (115 lb 4.8 oz) (10/24 0500)  HEMODYNAMICS:   VENTILATOR SETTINGS: Vent Mode:  [-]  FiO2 (%):  [35 %] 35 %  INTAKE / OUTPUT: Intake/Output     10/23 0701 - 10/24 0700 10/24 0701 - 10/25 0700   I.V. (mL/kg) 570 (10.9) 10 (0.2)   Other 1617    NG/GT 560    IV Piggyback 150    Total Intake(mL/kg) 2897 (55.4) 10 (0.2)   Urine (mL/kg/hr) 1 (0)    Total Output 1     Net +2896 +10        Urine Occurrence 9 x 2 x   Stool Occurrence 3 x     PHYSICAL  EXAMINATION: General:  No acute distress Neuro:  Nonverbal, bilateral upper and lower extremities contractures HEENT:  PERRL, dry membraanes Cardiovascular:  Regular, tachycardic Lungs:  Bilateral air entry, scattered rhonchi Abdomen:  Soft, nontender, bowel sounds diminished, PEG Musculoskeletal:  No edema Skin:  Fungal rash neck / upper torso  LABS:  Recent Labs Lab 11/21/12 1454 11/21/12 2224 11/22/12 0515 11/23/12 0500  HGB 17.9*  --  14.7 12.7*  WBC 11.2*  --  7.3 4.1  PLT 126*  --  115* 100*  NA 137  --  133* 138  K 4.1  --  3.8 3.4*  CL 98  --  98 101  CO2 23  --  25 30  GLUCOSE 78  --  116* 89  BUN 20  --  11 10  CREATININE 0.48*  --  0.53 0.55  CALCIUM 9.6  --  8.3* 8.4  MG  --   --  2.1 2.0  PHOS  --   --  3.4 3.5  AST 69*  --  39*  --   ALT 110*  --  70*  --   ALKPHOS 183*  --  149*  --   BILITOT 1.1  --  1.5*  --   PROT 8.1  --  6.1  --   ALBUMIN 4.0  --  3.0*  --  PROCALCITON  --  <0.10 <0.10 <0.10   Recent Labs Lab 11/23/12 1738 11/23/12 2217 11/24/12 0021 11/24/12 0821 11/24/12 1133  GLUCAP 121* 95 126* 97 113*   CXR:  10/21 >>> No overt airspace disease  ASSESSMENT / PLAN:  Acute febrile illness Elevated liver enzymes Dehydration Urine retention Fungal dermatitis Cerebral palsy with quadriplegia and seizure disorder Dysphagia s/p PEG OSA  --> Continue preadmission medications / therapies --> Blood cx, urine cx NTD, will d/c vancomycin and continue zosyn for 8 days. --> PNA on CT RLL, likely aspiration --> D/C levaquin. --> Continue Zosyn. --> D/C vancomycin. --> Change BiPAP to CPAP QHS. --> In/out cath. --> D/C IVF. --> Follow GB US noted, cholelithiasis without obstruction. --> Mycostatin topical. --> Heparin refused by patient's mother, aware of risks. --> C. Diff negative. --> Titrate O2 for sats. -->  If CPAP is tolerated overnight without desaturation may transfer to tele in AM.  I have personally obtained history,  examined patient, evaluated and interpreted laboratory and imaging results, reviewed medical records, formulated assessment / plan and placed orders.  Alyson Reedy, M.D. [facility] Pulmonary/Critical Care Medicine. Pager: [phone]. After hours pager: [phone].

## 2012-11-24 NOTE — Telephone Encounter (Signed)
[place] [facility] called back and stated that the doctor at the hospital has ordered an MRI for Jeffery [name] and that it was a miscommunication between her and the nurse and now things have been worked out and she no longer needs a call from Dr. Merri Brunette. MB

## 2012-11-24 NOTE — Telephone Encounter (Signed)
[place] [facility] has called back and stated that they are not going to be able to do an MRI because of the steel rods in his back and the baclofen pump, she says she will call Monday with an update if she doesn't have one before we close at 5:00 pm today. MB

## 2012-11-25 DIAGNOSIS — J690 Pneumonitis due to inhalation of food and vomit: Secondary | ICD-10-CM | POA: Diagnosis present

## 2012-11-25 HISTORY — PX: PORTACATH PLACEMENT: SHX2246

## 2012-11-25 LAB — CBC
HCT: 41.6 % (ref 39.0–52.0)
Hemoglobin: 13.4 g/dL (ref 13.0–17.0)
MCHC: 32.2 g/dL (ref 30.0–36.0)
MCV: 100.7 fL — ABNORMAL HIGH (ref 78.0–100.0)
Platelets: 105 10*3/uL — ABNORMAL LOW (ref 150–400)
RBC: 4.13 MIL/uL — ABNORMAL LOW (ref 4.22–5.81)
WBC: 3.5 10*3/uL — ABNORMAL LOW (ref 4.0–10.5)

## 2012-11-25 LAB — BASIC METABOLIC PANEL
BUN: 11 mg/dL (ref 6–23)
CO2: 32 mEq/L (ref 19–32)
Calcium: 9.4 mg/dL (ref 8.4–10.5)
Chloride: 98 mEq/L (ref 96–112)
Creatinine, Ser: 0.62 mg/dL (ref 0.50–1.35)
Potassium: 3.8 mEq/L (ref 3.5–5.1)
Sodium: 138 mEq/L (ref 135–145)

## 2012-11-25 LAB — GLUCOSE, CAPILLARY
Glucose-Capillary: 114 mg/dL — ABNORMAL HIGH (ref 70–99)
Glucose-Capillary: 92 mg/dL (ref 70–99)
Glucose-Capillary: 96 mg/dL (ref 70–99)
Glucose-Capillary: 98 mg/dL (ref 70–99)

## 2012-11-25 LAB — URINE CULTURE

## 2012-11-25 MED ORDER — FREE WATER
300.0000 mL | Freq: Three times a day (TID) | Status: DC
  Administered 2012-11-25 – 2012-11-29 (×12): 300 mL

## 2012-11-25 MED ORDER — RANITIDINE HCL 150 MG/10ML PO SYRP
150.0000 mg | ORAL_SOLUTION | Freq: Two times a day (BID) | ORAL | Status: DC
  Administered 2012-11-25 – 2012-11-29 (×9): 150 mg
  Filled 2012-11-25 (×12): qty 10

## 2012-11-25 NOTE — Progress Notes (Signed)
PULMONARY  / CRITICAL CARE MEDICINE  Name: Jeffery [name] MRN: [id number] DOB: Aug 11, 1990    ADMISSION DATE:  11/21/2012 CONSULTATION DATE:  11/21/2012  REFERRING MD :  EDP PRIMARY SERVICE:  PCCM  CHIEF COMPLAINT:  Fever  BRIEF PATIENT DESCRIPTION: 22 yo with past medical history of cerebral palsy and qudriplegia brought to ED with low grade fever, tachycardia, sweats and agitation since one day PTA.  There was no cough or witnessed aspiration. Mother also reports persistent diarrhea for several month with workup including several Clostridium Difficile PCR and colon biopsy being non revealing.  He was treated imperically with Flagyl.  SIGNIFICANT EVENTS / STUDIES:  10/21  Brought in with low grade fever, tachycardia and agitation 10/21  CT abdomen / pelvis >>> Patchy airspace disease RLL, improved compared to previous imaging, distended bladder, resolved colon thickening 10/21  GB US > Cholelithiasis is present without  additional signs of cholecystitis.   LINES / TUBES: PIV  CULTURES: 10/21 Blood >>>   01/21 Urine >>>  10/22  Stool C diff > neg  ANTIBIOTICS: Levaqin 10/21 >>>10/22 Vancomycin 10/21>>>10/24 Zosyn 10/22>>>  Subective/overnight events:  Sitting in chair with mother at beside on nasal 02 nad  VITAL SIGNS: Temp:  [97.6 F (36.4 C)-98.1 F (36.7 C)] 97.6 F (36.4 C) (10/25 0359) Pulse Rate:  [54-106] 54 (10/25 0359) Resp:  [10-23] 11 (10/25 0359) BP: (90-115)/(50-72) 90/51 mmHg (10/25 0359) SpO2:  [93 %-99 %] 98 % (10/25 0847) FiO2 (%):  [35 %] 35 % (10/25 0329) Weight:  [116 lb 13.5 oz (53 kg)] 116 lb 13.5 oz (53 kg) (10/25 0359)  HEMODYNAMICS:   VENTILATOR SETTINGS: Vent Mode:  [-]  FiO2 (%):  [35 %] 35 %  INTAKE / OUTPUT: Intake/Output     10/24 0701 - 10/25 0700 10/25 0701 - 10/26 0700   I.V. (mL/kg) 20 (0.4)    Other     NG/GT 500    IV Piggyback 50    Total Intake(mL/kg) 570 (10.8)    Urine (mL/kg/hr)     Total Output       Net +570           Urine Occurrence 4 x    Stool Occurrence 1 x     PHYSICAL EXAMINATION: General:  No acute distress Neuro:  Nonverbal, bilateral upper and lower extremities contractures HEENT:  PERRL, dry membraanes Cardiovascular:  Regular, tachycardic Lungs:  Bilateral air entry, scattered rhonchi Abdomen:  Soft, nontender, bowel sounds diminished, PEG Musculoskeletal:  No edema Skin:  Fungal rash neck / upper torso  LABS:  Recent Labs Lab 11/21/12 1454 11/21/12 2224 11/22/12 0515 11/23/12 0500 11/25/12 0535  HGB 17.9*  --  14.7 12.7* 13.4  WBC 11.2*  --  7.3 4.1 3.5*  PLT 126*  --  115* 100* 105*  NA 137  --  133* 138 138  K 4.1  --  3.8 3.4* 3.8  CL 98  --  98 101 98  CO2 23  --  25 30 32  GLUCOSE 78  --  116* 89 96  BUN 20  --  11 10 11   CREATININE 0.48*  --  0.53 0.55 0.62  CALCIUM 9.6  --  8.3* 8.4 9.4  MG  --   --  2.1 2.0 2.0  PHOS  --   --  3.4 3.5 3.8  AST 69*  --  39*  --   --   ALT 110*  --  70*  --   --  ALKPHOS 183*  --  149*  --   --   BILITOT 1.1  --  1.5*  --   --   PROT 8.1  --  6.1  --   --   ALBUMIN 4.0  --  3.0*  --   --   PROCALCITON  --  <0.10 <0.10 <0.10  --     Recent Labs Lab 11/24/12 1648 11/24/12 2023 11/25/12 0024 11/25/12 0358 11/25/12 0804  GLUCAP 103* 125* 114* 113* 98   CXR:  10/21 >>> No overt airspace disease  ASSESSMENT / PLAN:  Acute febrile illness Elevated liver enzymes Dehydration Urine retention Fungal dermatitis Cerebral palsy with quadriplegia and seizure disorder Dysphagia s/p PEG OSA Chronic diarrhea on ppi > changed to h2 10/25  --> Continue preadmission medications / therapies  --> PNA on CT RLL, likely aspiration> abx per dashboard  -> CPAP QHS. --> In/out cath.  --> Mycostatin topical. --> Heparin refused by patient's mother, aware of risks.  -> Titrate O2 for sats.    Discussed with mother at bedside, happy with current care so plan to continue same through w/e    Sandrea Hughs, MD Pulmonary  and Critical Care Medicine [facility] Cell [phone] After 5:30 PM or weekends, call [phone]

## 2012-11-26 DIAGNOSIS — J9600 Acute respiratory failure, unspecified whether with hypoxia or hypercapnia: Secondary | ICD-10-CM

## 2012-11-26 DIAGNOSIS — J9610 Chronic respiratory failure, unspecified whether with hypoxia or hypercapnia: Secondary | ICD-10-CM | POA: Diagnosis present

## 2012-11-26 DIAGNOSIS — J690 Pneumonitis due to inhalation of food and vomit: Principal | ICD-10-CM

## 2012-11-26 LAB — GLUCOSE, CAPILLARY
Glucose-Capillary: 137 mg/dL — ABNORMAL HIGH (ref 70–99)
Glucose-Capillary: 105 mg/dL — ABNORMAL HIGH (ref 70–99)
Glucose-Capillary: 94 mg/dL (ref 70–99)
Glucose-Capillary: 116 mg/dL — ABNORMAL HIGH (ref 70–99)

## 2012-11-26 NOTE — Progress Notes (Signed)
PULMONARY  / CRITICAL CARE MEDICINE  Name: Jeffery [name] MRN: [id number] DOB: 08/12/1990    ADMISSION DATE:  11/21/2012 CONSULTATION DATE:  11/21/2012  REFERRING MD :  EDP PRIMARY SERVICE:  PCCM  CHIEF COMPLAINT:  Fever  BRIEF PATIENT DESCRIPTION: 22 yo with past medical history of cerebral palsy and qudriplegia brought to ED with low grade fever, tachycardia, sweats and agitation since one day PTA.  There was no cough or witnessed aspiration. Mother also reports persistent diarrhea for several month with workup including several Clostridium Difficile PCR and colon biopsy being non revealing.  He was treated imperically with Flagyl.  SIGNIFICANT EVENTS / STUDIES:  10/21  Brought in with low grade fever, tachycardia and agitation 10/21  CT abdomen / pelvis >>> Patchy airspace disease RLL, improved compared to previous imaging, distended bladder, resolved colon thickening 10/21  GB US > Cholelithiasis is present without  additional signs of cholecystitis.   LINES / TUBES: PIV  CULTURES: 10/21 Blood >>>   01/21 Urine > neg  10/22  Stool C diff > neg  ANTIBIOTICS: Levaqin 10/21 >>>10/22 Vancomycin 10/21>>>10/24 Zosyn 10/22>>>  Subective/overnight events:  Doing better, no cough to request but no increased wob Still receiving bipap at hs   VITAL SIGNS: Temp:  [97.5 F (36.4 C)-98.6 F (37 C)] 97.6 F (36.4 C) (10/26 0800) Pulse Rate:  [62-94] 73 (10/26 1000) Resp:  [10-20] 19 (10/26 1000) BP: (95-117)/(44-75) 117/75 mmHg (10/26 1000) SpO2:  [92 %-100 %] 94 % (10/26 1208) FiO2 (%):  [35 %] 35 % (10/26 0326) Weight:  [112 lb 3.4 oz (50.9 kg)] 112 lb 3.4 oz (50.9 kg) (10/26 0423)  HEMODYNAMICS:   VENTILATOR SETTINGS: Vent Mode:  [-]  FiO2 (%):  [35 %] 35 %  INTAKE / OUTPUT: Intake/Output     10/25 0701 - 10/26 0700 10/26 0701 - 10/27 0700   I.V. (mL/kg) 130 (2.6) 30 (0.6)   Other 1071    NG/GT 800    IV Piggyback 150    Total Intake(mL/kg) 2151 (42.3) 30 (0.6)    Net +2151 +30        Urine Occurrence 1 x 1 x   Stool Occurrence 1 x     PHYSICAL EXAMINATION: General:  No acute distress Neuro:  Nonverbal, bilateral upper and lower extremities contractures HEENT:  PERRL, dry membraanes Cardiovascular:  Regular, tachycardic Lungs:  Bilateral air entry, scattered rhonchi Abdomen:  Soft, nontender, bowel sounds diminished, PEG Musculoskeletal:  No edema Skin:  Fungal rash neck / upper torso  LABS:  Recent Labs Lab 11/21/12 1454 11/21/12 2224 11/22/12 0515 11/23/12 0500 11/25/12 0535  HGB 17.9*  --  14.7 12.7* 13.4  WBC 11.2*  --  7.3 4.1 3.5*  PLT 126*  --  115* 100* 105*  NA 137  --  133* 138 138  K 4.1  --  3.8 3.4* 3.8  CL 98  --  98 101 98  CO2 23  --  25 30 32  GLUCOSE 78  --  116* 89 96  BUN 20  --  11 10 11   CREATININE 0.48*  --  0.53 0.55 0.62  CALCIUM 9.6  --  8.3* 8.4 9.4  MG  --   --  2.1 2.0 2.0  PHOS  --   --  3.4 3.5 3.8  AST 69*  --  39*  --   --   ALT 110*  --  70*  --   --   ALKPHOS 183*  --  149*  --   --   BILITOT 1.1  --  1.5*  --   --   PROT 8.1  --  6.1  --   --   ALBUMIN 4.0  --  3.0*  --   --   PROCALCITON  --  <0.10 <0.10 <0.10  --     Recent Labs Lab 11/25/12 1653 11/25/12 2024 11/26/12 0024 11/26/12 0425 11/26/12 0903  GLUCAP 121* 92 137* 105* 80   CXR:  10/21 >>> No overt airspace disease  ASSESSMENT / PLAN:  Acute febrile illness Elevated liver enzymes Dehydration Urine retention Fungal dermatitis Cerebral palsy with quadriplegia and seizure disorder Dysphagia s/p PEG OSA Chronic diarrhea on ppi > changed to h2 10/25  --> Continue preadmission medications / therapies  --> PNA on CT RLL, likely aspiration> abx per dashboard  -> BIPAP  QHS. --> In/out cath.  --> Mycostatin topical. --> Heparin refused by patient's mother, aware of risks.  -> Titrate O2 for sats.    Discussed with mother at bedside, happy with current care so plan to continue same through w/e Says did not get  adequate case manager/ social worker input on last discharge so need to address this 10/26    Sandrea Hughs, MD Pulmonary and Critical Care Medicine [facility] Cell [phone] After 5:30 PM or weekends, call [phone]

## 2012-11-27 LAB — GLUCOSE, CAPILLARY: Glucose-Capillary: 104 mg/dL — ABNORMAL HIGH (ref 70–99)

## 2012-11-27 MED ORDER — POTASSIUM CHLORIDE 20 MEQ/15ML (10%) PO LIQD
40.0000 meq | Freq: Once | ORAL | Status: AC
  Administered 2012-11-27: 40 meq
  Filled 2012-11-27: qty 30

## 2012-11-27 NOTE — Progress Notes (Signed)
PULMONARY  / CRITICAL CARE MEDICINE  Name: Jeffery [name] MRN: [id number] DOB: 1990/06/24    ADMISSION DATE:  11/21/2012 CONSULTATION DATE:  11/21/2012  REFERRING MD :  EDP PRIMARY SERVICE:  PCCM  CHIEF COMPLAINT:  Fever  BRIEF PATIENT DESCRIPTION: 22 yo with past medical history of cerebral palsy and qudriplegia brought to ED 10/21 with low grade fever, tachycardia, sweats and agitation since one day PTA.  There was no cough or witnessed aspiration. Mother also reports persistent diarrhea for several month with workup including several Clostridium Difficile PCR and colon biopsy being non revealing.  He was treated imperically with Flagyl.  SIGNIFICANT EVENTS / STUDIES:  10/21  Brought in with low grade fever, tachycardia and agitation 10/21  CT abdomen / pelvis >>> Patchy airspace disease RLL, improved compared to previous imaging, distended bladder, resolved colon thickening 10/21  GB US > Cholelithiasis is present without additional signs of cholecystitis.   LINES / TUBES: PIV  CULTURES: 10/21 Blood >>>  Neg  01/21 Urine > neg  10/22  Stool C diff > neg  ANTIBIOTICS: Levaqin 10/21 >>>10/22 Vancomycin 10/21>>>10/24 Zosyn 10/22>>>  Subective/overnight events:    VITAL SIGNS: Temp:  [97.9 F (36.6 C)-99 F (37.2 C)] 98.3 F (36.8 C) (10/27 0817) Pulse Rate:  [53-115] 115 (10/27 1219) Resp:  [10-32] 20 (10/27 1219) BP: (76-115)/(42-82) 115/42 mmHg (10/27 1219) SpO2:  [90 %-100 %] 94 % (10/27 1219) FiO2 (%):  [35 %-100 %] 100 % (10/26 2338) 5 liters  HEMODYNAMICS:   VENTILATOR SETTINGS: Vent Mode:  [-]  FiO2 (%):  [35 %-100 %] 100 %  INTAKE / OUTPUT: Intake/Output     10/26 0701 - 10/27 0700 10/27 0701 - 10/28 0700   I.V. (mL/kg) 180 (3.5) 20 (0.4)   Other  237   NG/GT 570 420   IV Piggyback 100    Total Intake(mL/kg) 850 (16.7) 677 (13.3)   Net +850 +677        Urine Occurrence 2 x 1 x   Stool Occurrence  1 x    PHYSICAL EXAMINATION: General:  No acute  distress Neuro:  Nonverbal, bilateral upper and lower extremities contractures HEENT:  PERRL, dry membraanes Cardiovascular:  Regular, tachycardic Lungs:  Bilateral air entry, scattered rhonchi Abdomen:  Soft, nontender, bowel sounds diminished, PEG Musculoskeletal:  No edema Skin:  Fungal rash neck / upper torso  LABS:  Recent Labs Lab 11/21/12 1454 11/21/12 2224 11/22/12 0515 11/23/12 0500 11/25/12 0535  HGB 17.9*  --  14.7 12.7* 13.4  WBC 11.2*  --  7.3 4.1 3.5*  PLT 126*  --  115* 100* 105*  NA 137  --  133* 138 138  K 4.1  --  3.8 3.4* 3.8  CL 98  --  98 101 98  CO2 23  --  25 30 32  GLUCOSE 78  --  116* 89 96  BUN 20  --  11 10 11   CREATININE 0.48*  --  0.53 0.55 0.62  CALCIUM 9.6  --  8.3* 8.4 9.4  MG  --   --  2.1 2.0 2.0  PHOS  --   --  3.4 3.5 3.8  AST 69*  --  39*  --   --   ALT 110*  --  70*  --   --   ALKPHOS 183*  --  149*  --   --   BILITOT 1.1  --  1.5*  --   --   PROT 8.1  --  6.1  --   --   ALBUMIN 4.0  --  3.0*  --   --   PROCALCITON  --  <0.10 <0.10 <0.10  --     Recent Labs Lab 11/26/12 2035 11/26/12 2318 11/27/12 0401 11/27/12 0757 11/27/12 1237  GLUCAP 114* 115* 101* 87 90   CXR:  10/21 >>> No overt airspace disease  ASSESSMENT / PLAN:  Acute febrile illness in setting of PNA.  PNA on CT RLL, likely aspiration Plan abx per dashboard, could complete 8-10d total Wean FIO2 Change BiPAP to CPAP.  Chronic Respiratory failure and OSA Plan Cont o2 Nocturnal CPAP  Elevated liver enzymes Improved   Recent Labs Lab 11/21/12 1454 11/22/12 0515  ALT 110* 70*  AST 69* 39*  ALKPHOS 183* 149*  BILITOT 1.1 1.5*  P: Monitor  Urine retention P: I&O cath   Fungal dermatitis plan  Mycostatin topical.  Cerebral palsy with quadriplegia and seizure disorder plan --> Continue preadmission medications / therapies  Chronic diarrhea on ppi > change to H2 blocker  Plan: Monitor   Patient will need to slowly transition to CPAP  instead of BiPAP (he has a CPAP at home).  Continue course of abx as delineated above.  Will need to involve CSW prior to discharge at the mother's request.  Will hold in SDU, transfer care to [facility], PCCM will sign off, please call back if needed.    Alyson Reedy, M.D. [facility] Pulmonary/Critical Care Medicine. Pager: [phone]. After hours pager: [phone].

## 2012-11-27 NOTE — Progress Notes (Signed)
Pt removed from bipap per pt family.

## 2012-11-27 NOTE — Telephone Encounter (Signed)
I spoke with mother for about 9 1/2 minutes.  The patient had a difficult time with chronic diarrhea not related to clostridia difficile.  He had abdominal pain that was of uncertain etiology.  Gradually this is improved.  He is moving back for to baseline.  He had a difficult IV access, and mother asked me about a central port.  I think that is a good idea because he is going to need IV access in the future, and it has been very difficult in the past.  She has a skills in order to build keep a port clean and well dressed.There were no acute neurologic issues.

## 2012-11-28 ENCOUNTER — Inpatient Hospital Stay (HOSPITAL_COMMUNITY): Payer: Medicaid Other

## 2012-11-28 ENCOUNTER — Encounter (HOSPITAL_COMMUNITY): Payer: Self-pay | Admitting: Radiology

## 2012-11-28 LAB — CULTURE, BLOOD (ROUTINE X 2)
Culture: NO GROWTH
Culture: NO GROWTH

## 2012-11-28 LAB — GLUCOSE, CAPILLARY
Glucose-Capillary: 108 mg/dL — ABNORMAL HIGH (ref 70–99)
Glucose-Capillary: 111 mg/dL — ABNORMAL HIGH (ref 70–99)
Glucose-Capillary: 76 mg/dL (ref 70–99)

## 2012-11-28 LAB — PROTIME-INR: INR: 1.02 (ref 0.00–1.49)

## 2012-11-28 LAB — APTT: aPTT: 30 seconds (ref 24–37)

## 2012-11-28 MED ORDER — CEFAZOLIN SODIUM 1-5 GM-% IV SOLN
INTRAVENOUS | Status: AC
  Filled 2012-11-28: qty 100

## 2012-11-28 NOTE — Progress Notes (Signed)
[facility] Progress Note [place] TEAM 1 - Stepdown ICU Team   Jeffery [name] ZOX:[id number] DOB: October 24, 1990 DOA: 11/21/2012 PCP: Mia Creek, MD  Brief narrative: 22 yo with past medical history of prematurity related spastic quadraplegia and seizures with resultant profound neuromuscular deficits brought to ED 10/21 with low grade fever, tachycardia, sweats and agitation for one day PTA. There was no cough or witnessed aspiration. Mother also reported persistent diarrhea for several months with workup including several Clostridium Difficile PCR and colon biopsy being non revealing. He was treated emperically with Flagyl. He was admitted to the ICU under PCCM. He required BiPAP but was never intubated. Evaluation revealed RLL pneumonia on CT abd/pelvis. Pt with apparent severe abdominal pain pre admit in setting of diarrhea. CT unrevealing except for some stool burden right colon. Abd [place] was unremarkable except for cholelithiasis. No evidence of UTI and C diff here was negative. Mother earlier refused Heparin for DVT prophylaxis.  Assessment/Plan: Active Problems:   Acute respiratory failure due to:   a)  OSA on CPAP   b ) Aspiration pneumonia -PCCM transitioned to nocturnal CPAP on 10/27- tol. 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 [name] has advised pt receive portacath for labs (very difficult stick) and also she feels he will need access for comfort meds eventually  -IR consulted -mother aware pt now in terminal stages of his chronic illness- she is very overwhelmed and vacillates between acceptance and  denial -she also has multiple health problems and continues to deflect all conversations back to herslf- she appears desperately to need respite care but is also appears unwilling to let go of primary caregiver role    Diarrhea  -despite regular Miralax at home pt had focal obstipation which lead to diarrhea pre admit (mother adamant this could not be possible)   DVT prophylaxis: Subcutaneous heparin Code Status: Full Family Communication: Extensive bedside conversation with mother Disposition Plan/Expected LOS: Step down with eventual plans to discharge home with home health RN-suspect can discharge home once Port-A-Cath placed Isolation: None  Nutritional Status: Chronic PEG tube  Consultants: PCCM  Procedures: None  CULTURES:  10/21 Blood >>> Neg  01/21 Urine > neg  10/22 Stool C diff > neg  Antibiotics: Levaqin 10/21 >>>10/22  Vancomycin 10/21>>>10/24  Zosyn 10/22>>>  HPI/Subjective: Patient alert and smiles frequently when his mother mentions his name in situations involving him. No apparent discomfort discernible on clinical exam.  Objective: Blood pressure 106/55, pulse 79, temperature 98.2 F (36.8 C), temperature source Axillary, resp. rate 15, height 4' 11.5" (1.511 m), weight 52.7 kg (116 lb 2.9 oz), SpO2 100.00%.  Intake/Output Summary (Last 24 hours) at 11/28/12 1342 Last data filed at 11/28/12 1016  Gross per 24 hour  Intake   2001 ml  Output      0 ml  Net   2001 ml     Exam: General: No acute respiratory distress Lungs: Clear to auscultation bilaterally without wheezes or crackles, 4L Cardiovascular: Regular rate and rhythm without murmur gallop or rub normal S1 and S2, no peripheral edema or JVD Abdomen: Nontender, nondistended, soft, bowel sounds positive, no rebound, no ascites, no appreciable mass  Musculoskeletal: No significant cyanosis, clubbing of bilateral lower extremities Neurological: Alert, confined to a specialized wheelchair, has lower  extremity splints in place, generalized atrophy was stable spastic quadriparesis  Scheduled Meds:  Scheduled Meds: . albuterol  2.5 mg Nebulization Q4H  . ascorbic acid  500 mg Per Tube QHS  . calcium carbonate (dosed in mg elemental calcium)  500 mg of elemental calcium Per Tube BID  . ENSURE PLUS  237 mL Per Tube QID  . feeding supplement (PRO-STAT SUGAR FREE 64)  30 mL Per Tube BID  . folic acid  1 mg Oral Daily  . free water  300 mL Per Tube TID AC & HS  . gabapentin  800 mg Oral TID  . heparin subcutaneous  5,000 Units Subcutaneous Q8H  . levETIRAcetam  1,750 mg Per Tube BID  . multivitamin  10 mL Per Tube Q24H  . nystatin cream   Topical BID  . piperacillin-tazobactam (ZOSYN)  IV  3.375 g Intravenous Q8H  . ranitidine  150 mg Per Tube BID  . sodium chloride  10-40 mL Intracatheter Q12H  . sucralfate  1 g Per Tube 5 X Daily  . Zinc 244mg /29ml (patient's own medication)  5 mL Per Tube Q2000   Continuous Infusions: . sodium chloride 20 mL/hr at 11/28/12 0700  . baclofen      **Reviewed in detail by the Attending Physician  Data Reviewed: Basic Metabolic Panel:  Recent Labs Lab 11/21/12 1454 11/22/12 0515 11/23/12 0500 11/25/12 0535  NA 137 133* 138 138  K 4.1 3.8 3.4* 3.8  CL 98 98 101 98  CO2 23 25 30  32  GLUCOSE 78 116* 89 96  BUN 20 11 10 11   CREATININE 0.48* 0.53 0.55 0.62  CALCIUM 9.6 8.3* 8.4 9.4  MG  --  2.1 2.0 2.0  PHOS  --  3.4 3.5 3.8   Liver Function Tests:  Recent Labs Lab 11/21/12 1454 11/22/12 0515  AST 69* 39*  ALT 110* 70*  ALKPHOS 183* 149*  BILITOT 1.1 1.5*  PROT 8.1 6.1  ALBUMIN 4.0 3.0*    Recent Labs Lab 11/21/12 1454  LIPASE 24   No results found for this basename: AMMONIA,  in the last 168 hours CBC:  Recent Labs Lab 11/21/12 1454 11/22/12 0515 11/23/12 0500 11/25/12 0535  WBC 11.2* 7.3 4.1 3.5*  NEUTROABS 9.0*  --   --   --   HGB 17.9* 14.7 12.7* 13.4  HCT 52.0 45.3 39.3 41.6  MCV 98.7 97.6 100.3* 100.7*  PLT  126* 115* 100* 105*   Cardiac Enzymes: No results found for this basename: CKTOTAL, CKMB, CKMBINDEX, TROPONINI,  in the last 168 hours BNP (last 3 results) No results found for this basename: PROBNP,  in the last 8760 hours CBG:  Recent Labs Lab 11/27/12 2052 11/28/12 0025 11/28/12 0421 11/28/12 0823 11/28/12 1243  GLUCAP 94 111* 88 76 101*    Recent Results (from the past 240 hour(s))  CULTURE, BLOOD (ROUTINE X 2)     Status: None   Collection Time    11/21/12 10:16 PM      Result Value Range Status   Specimen Description BLOOD WRIST LEFT   Final   Special Requests BOTTLES DRAWN AEROBIC ONLY [facility]   Final   Culture  Setup Time     Final   Value: 11/22/2012 05:55     Performed at [facility]   Culture     Final   Value: NO GROWTH  5 DAYS     Performed at [facility]   Report Status 11/28/2012 FINAL   Final  CULTURE, BLOOD (ROUTINE X 2)     Status: None   Collection Time    11/21/12 10:23 PM      Result Value Range Status   Specimen Description BLOOD HAND LEFT   Final   Special Requests BOTTLES DRAWN AEROBIC ONLY 10CC   Final   Culture  Setup Time     Final   Value: 11/22/2012 05:55     Performed at [facility]   Culture     Final   Value: NO GROWTH 5 DAYS     Performed at [facility]   Report Status 11/28/2012 FINAL   Final  MRSA PCR SCREENING     Status: None   Collection Time    11/21/12 11:05 PM      Result Value Range Status   MRSA by PCR NEGATIVE  NEGATIVE Final   Comment:            The GeneXpert MRSA Assay (FDA     approved for NASAL specimens     only), is one component of a     comprehensive MRSA colonization     surveillance program. It is not     intended to diagnose MRSA     infection nor to guide or     monitor treatment for     MRSA infections.  CLOSTRIDIUM DIFFICILE BY PCR     Status: None   Collection Time    11/22/12 12:04 PM      Result Value Range Status   C difficile by pcr NEGATIVE  NEGATIVE Final    URINE CULTURE     Status: None   Collection Time    11/24/12  2:12 PM      Result Value Range Status   Specimen Description URINE, CLEAN CATCH   Final   Special Requests NONE   Final   Culture  Setup Time     Final   Value: 11/24/2012 14:45     Performed at [facility] Count     Final   Value: NO GROWTH     Performed at [facility]   Culture     Final   Value: NO GROWTH     Performed at [facility]   Report Status 11/25/2012 FINAL   Final     Studies:  Recent x-ray studies have been reviewed in detail by the Attending Physician     Junious Silk, ANP [facility] Office  [phone] Pager [phone]  **If unable to reach the above provider after paging please contact the Flow Manager @ [phone]  On-Call/Text Page:      [email or web address]      password [email or web address]  If 7PM-7AM, please contact night-coverage [email or web address] Password [email or web address] 11/28/2012, 1:42 PM   LOS: 7 days   I have examined the patient, reviewed the chart and modified the above note which I agree with.   Jerrell Mangel,MD [phone] 11/28/2012, 5:32 PM

## 2012-11-28 NOTE — H&P (Signed)
Jeffery [name] is an 22 y.o. male.   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 CP (cerebral palsy), spastic, quadriplegic   . Epilepsy   . Osteoporosis   . Inguinal hernia   . Undescended testes   . Seasonal allergies   . IVH (intraventricular hemorrhage)     Grade IV  . Hip dislocation, bilateral   . Seizures   . Hx: UTI (urinary tract infection)   . Dysphagia   . Otitis media   . Retinopathy of prematurity   . 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 Neuromuscular scoliosis   . Osteoporosis   . Complex partial seizures   . Generalized convulsive epilepsy without mention of intractable epilepsy   . Epilepsy   . Sinus bradycardia     HR drops to 38-40 while sleeping  . Sleep apnea     BiPAP  . Blister of right heel     fluid filled; origin unknown  . Kidney stones     ?  Marland Kitchen Pneumonia      chronic pneumonia ,respitory failure dx Augest 2014    Past Surgical History  Procedure Laterality Date  . Excision of moles    . Peg placement      With multiple changes  . Gastrostomy tube, change / reposition  12/15/2010       . Button change  12/15/2010    Procedure: BUTTON CHANGE;  Surgeon: Iva Boop, MD;  Location: [facility];  Service: Endoscopy;  Laterality: N/A;  . Peg placement  10/07/2011    Procedure: PERCUTANEOUS ENDOSCOPIC GASTROSTOMY (PEG) REPLACEMENT;  Surgeon: Hart Carwin, MD;  Location: [facility];  Service: Endoscopy;  Laterality: N/A;  Needs 18 F 2.5  button ordered-dl  . Inguinal hernia repair Bilateral 1992  . Retinopathy of prematurity surgery  1992  . Achilles tendon lengthening  12/1998  . Soft tissue releases  wrists and fingers  12/1998  . Intrathecal baclofen pump placement  07/25/2000  . Peg placement N/A 06/05/2012    Procedure: PERCUTANEOUS ENDOSCOPIC GASTROSTOMY (PEG) REPLACEMENT;  Surgeon: Hart Carwin, MD;  Location: [facility];  Service: Endoscopy;  Laterality: N/A;  button 26fr.2.5cm  . Eye surgery Bilateral     Retinal   . Back surgery      Harrington Rods in back needs to be log rolled  . Tendon repair Bilateral 09/05/2012    Procedure: LENGTHENING OF DIGITAL FLEXOR TENDONS BILTERAL HANDS;  Surgeon: Jodi Marble, MD;  Location: [facility] OR;  Service: Orthopedics;  Laterality: Bilateral;  . Hand surgery Bilateral Aug. 2014  . Peg placement N/A 09/13/2012    Procedure: PERCUTANEOUS ENDOSCOPIC GASTROSTOMY (PEG) REPLACEMENT;  Surgeon: Hart Carwin, MD;  Location: [facility];  Service: Endoscopy;  Laterality: N/A;  . Flexible sigmoidoscopy N/A 10/30/2012    Procedure: FLEXIBLE SIGMOIDOSCOPY;  Surgeon: Florencia Reasons, MD;  Location: [facility];  Service: Endoscopy;  Laterality: N/A;  . Peg placement N/A 11/15/2012  Procedure: PERCUTANEOUS ENDOSCOPIC GASTROSTOMY (PEG) REPLACEMENT;  Surgeon: Petra Kuba, MD;  Location: [facility] END[facility]COPY;  Service: Endoscopy;  Laterality: N/A;    Family History  Problem Relation Age of Onset  . Adopted: Yes   Social History:  reports that he has never smoked. He has never used smokeless tobacco. He reports that he does not drink alcohol or use illicit drugs.  Allergies:  Allergies  Allergen Reactions  . Adhesive [Tape]     Rips skin off  . Depakote [Divalproex Sodium]     Causes pancreatitis     Medications Prior to Admission  Medication Dose Route Frequency Provider Last Rate Last Dose  . botulinum toxin Type A (BOTOX) injection 300 Units  300 Units Intramuscular Once Levert Feinstein,  MD       Medications Prior to Admission  Medication Sig Dispense Refill  . albuterol (PROVENTIL HFA;VENTOLIN HFA) 108 (90 BASE) MCG/ACT inhaler Inhale 2 puffs into the lungs every 4 (four) hours as needed for shortness of breath.      Marland Kitchen albuterol (PROVENTIL) (2.5 MG/3ML) 0.083% nebulizer solution Take 2.5 mg by nebulization 4 (four) times daily as needed for shortness of breath. 1 ampule twice daily and as needed (making 4 doses daily)      . Ascorbic Acid (VITAMIN C) 500 MG/5ML LIQD 500 mg by PEG Tube route at bedtime.       . calcium carbonate, dosed in mg elemental calcium, 1250 MG/5ML 1,000 mg of elemental calcium by PEG Tube route 2 (two) times daily. 4 cc via peg tube - ON HOLD UNTIL 11/17/12 PER DR. [name]      . Cyanocobalamin (VITAMIN B-12 IJ) Inject 1 mL as directed every 30 (thirty) days.       . ENSURE PLUS (ENSURE PLUS) LIQD Take 237 mLs by mouth 4 (four) times daily.       . feeding supplement (PRO-STAT SUGAR FREE 64) LIQD Place 30 mLs into feeding tube 2 (two) times daily.  900 mL  0  . folic acid (FOLVITE) 1 MG tablet Take 1 mg by mouth daily.      Marland Kitchen gabapentin (NEURONTIN) 250 MG/5ML solution Take 800 mg by mouth 3 (three) times daily.      . lansoprazole (PREVACID SOLUTAB) 30 MG disintegrating tablet Take 30 mg by mouth daily.       Marland Kitchen levETIRAcetam (KEPPRA) 100 MG/ML solution Give 17.5 mls by PEG tube twice per day.  1200 mL  5  . loperamide (IMODIUM A-D) 2 MG tablet Take 2 mg by mouth 4 (four) times daily as needed for diarrhea or loose stools.      . Multiple Vitamin (MULTIVITAMIN) LIQD 10 mLs by PEG Tube route daily.      . Multiple Vitamins-Minerals (ZINC PO) 5 mLs by PEG Tube route daily. 244 mg / 5 mL zinc solution      . mupirocin ointment (BACTROBAN) 2 % Apply 1 application topically as needed (to G-T site).  22 g  1  . OVER THE COUNTER MEDICATION Apply 1 application topically as needed (after diaper changes). Balmex and Bag Balm paste made and applied after diaper changes       . sucralfate (CARAFATE) 1 G tablet 1 g by PEG Tube route 4 (four) times daily. 30 minutest before meals and 1 at bedtime      . Water For Irrigation, Sterile (FREE WATER) SOLN Place 100-200 mLs into feeding tube 5 (five) times daily.      . baclofen (  GABLOFEN) 40000 MCG/20ML SOLN 326 mcg by Intrathecal route continuous.       . OnabotulinumtoxinA (BOTOX IM) Inject 1 mL into the muscle every 3 (three) months. Last taken on 10-11-12      . OXYGEN-HELIUM IN Inhale into the lungs as needed.        Results for orders placed during the hospital encounter of 11/21/12 (from the past 48 hour(s))  GLUCOSE, CAPILLARY     Status: None   Collection Time    11/26/12  1:54 PM      Result Value Range   Glucose-Capillary 94  70 - 99 mg/dL  GLUCOSE, CAPILLARY     Status: Abnormal   Collection Time    11/26/12  4:49 PM      Result Value Range   Glucose-Capillary 116 (*) 70 - 99 mg/dL  GLUCOSE, CAPILLARY     Status: Abnormal   Collection Time    11/26/12  8:35 PM      Result Value Range   Glucose-Capillary 114 (*) 70 - 99 mg/dL  GLUCOSE, CAPILLARY     Status: Abnormal   Collection Time    11/26/12 11:18 PM      Result Value Range   Glucose-Capillary 115 (*) 70 - 99 mg/dL  GLUCOSE, CAPILLARY     Status: Abnormal   Collection Time    11/27/12  4:01 AM      Result Value Range   Glucose-Capillary 101 (*) 70 - 99 mg/dL  GLUCOSE, CAPILLARY     Status: None   Collection Time    11/27/12  7:57 AM      Result Value Range   Glucose-Capillary 87  70 - 99 mg/dL  GLUCOSE, CAPILLARY     Status: None   Collection Time    11/27/12 12:37 PM      Result Value Range   Glucose-Capillary 90  70 - 99 mg/dL  GLUCOSE, CAPILLARY     Status: Abnormal   Collection Time    11/27/12  4:59 PM      Result Value Range   Glucose-Capillary 104 (*) 70 - 99 mg/dL   Comment 1 Notify RN    GLUCOSE, CAPILLARY     Status: None   Collection Time    11/27/12  8:52 PM      Result Value Range   Glucose-Capillary 94  70 -  99 mg/dL   Comment 1 Notify RN    GLUCOSE, CAPILLARY     Status: Abnormal   Collection Time    11/28/12 12:25 AM      Result Value Range   Glucose-Capillary 111 (*) 70 - 99 mg/dL   Comment 1 Notify RN    GLUCOSE, CAPILLARY     Status: None   Collection Time    11/28/12  4:21 AM      Result Value Range   Glucose-Capillary 88  70 - 99 mg/dL   Comment 1 Notify RN    GLUCOSE, CAPILLARY     Status: None   Collection Time    11/28/12  8:23 AM      Result Value Range   Glucose-Capillary 76  70 - 99 mg/dL   Comment 1 Notify RN     No results found.  Review of Systems  Constitutional: Negative for fever.  Gastrointestinal: Negative for nausea, vomiting and abdominal pain.  Neurological: Positive for weakness.    Blood pressure 111/71, pulse 76, temperature 98.4 F (36.9 C), temperature source Axillary, resp. rate 15, height  4' 11.5" (1.511 m), weight 116 lb 2.9 oz (52.7 kg), SpO2 100.00%. Physical Exam  Constitutional: He appears distressed.  Neck:  Neck sl strains to rt  Cardiovascular: Normal rate.   Murmur heard. Respiratory: He has wheezes.  GI: Soft.  Musculoskeletal:  Pt has cerebral palsy; quadriplegic - since birth Can lay on back if legs are supported Chest is clear of lines.  Limbs do not come up to chest..  Neurological:  Pt is able to communicate with nods and eyes; understands most all conversation     Assessment/Plan Congenital cerebral palsy and quadriplegia Many bouts of pna and hospitalizations Admitted now for same afeb and on Zosyn Has existing PICC in Rt arm MD requesting Port A cath for permanent access Guardian is aware of procedure benefits and risks and agreeable to proceed Guardian is RN with 38 yrs experience; 10 yrs Home Health Hep injection held since this am    Shivaan Tierno A 11/28/2012, 12:22 PM

## 2012-11-28 NOTE — Progress Notes (Signed)
RT did not do Chest vest in afternoon because pt had just had porta cath placed. It was new wound site. RT will continue to do CPT as needed below site.

## 2012-11-28 NOTE — Progress Notes (Addendum)
Patient ID: Jeffery [name], male   DOB: 1990/12/11, 22 y.o.   MRN: [id number]   Pt scheduled for Port a Cath placement this afternoon  As per note: cerebral palsy; quadriplegia Many hospitalizations; many bouts of pna In hospital now for pna BC neg this visit; Urine cx neg; stool cx neg On Zosyn and afeb   Pt has existing Rt arm PICC MD and pts guardian has requested more long term, permanent access in form of PAC  We discussed with Inf disease MD- Dr Luciana Axe  Although pt has hx of PNA, fever,and hospitalizations, ID would not necessarily remove PAC if pt returned with similar sxs. If pt was not septic; BC neg-- recommending removal would not be first line.  Since pt is such a difficult stick and needs long term access We feel PAC placement is an appropriate option after discussing with Inf Disease MD.  Jeffery [name] is agreeable to move forward She has good understanding PAC would have to be removed if pt presented with sepsis/bacteremia or recommended by ID.

## 2012-11-29 MED ORDER — ACETAMINOPHEN 160 MG/5ML PO SOLN: 325.0000 mg | ORAL | Status: DC | PRN

## 2012-11-29 MED ORDER — NYSTATIN 100000 UNIT/GM EX CREA: TOPICAL_CREAM | Freq: Two times a day (BID) | CUTANEOUS | Status: DC

## 2012-11-29 MED ORDER — HEPARIN SOD (PORK) LOCK FLUSH 100 UNIT/ML IV SOLN
500.0000 [IU] | INTRAVENOUS | Status: AC | PRN
  Administered 2012-11-29: 500 [IU]

## 2012-11-29 NOTE — Discharge Summary (Signed)
Physician Discharge Summary  Jeffery [name]:[id number] DOB: 09-Mar-1990 DOA: 11/21/2012  PCP: Jeffery Creek, MD  Admit date: 11/21/2012 Discharge date: 11/29/2012  Time spent: 45 minutes  Recommendations for Outpatient Follow-up:  1. Have arranged for Jeffery [name] to assist with training the family with port a cath care 2. He will continue previous HH for oxygen, tube feeding supplies, BiPAP and Vibravest 3. Follow up with Dr. 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   Neuromuscular scoliosis   Diarrhea-resolved   Discharge Condition: stable  Diet recommendation: 4 cans Ensure Plus with Prostat liquid 30 cc BID per PEG - recommend keep HOB elevated at least 30-45 degrees with administration of meds or Ensure - optimal would be for all meds/feeds to be completed while up in specialized wheelchair  Filed Weights   11/26/12 0423 11/28/12 0500 11/29/12 0427  Weight: 50.9 kg (112 lb 3.4 oz) 52.7 kg (116 lb 2.9 oz) 52.7 kg (116 lb 2.9 oz)    History of present illness:  22 yo with past medical history of prematurity related spastic quadraplegia and seizures with resultant profound neuromuscular deficits brought to ED 10/21 with low grade fever, tachycardia, sweats and agitation for one day PTA. There was no cough or witnessed aspiration. Mother also reported persistent diarrhea for several months with workup including several Clostridium Difficile PCR and colon biopsy being non revealing. He was treated emperically with Flagyl. He was admitted to the ICU under PCCM. He required BiPAP but was never intubated. Evaluation revealed RLL  pneumonia on CT abd/pelvis. Pt with apparent severe abdominal pain pre admit in setting of diarrhea. CT unrevealing except for some stool burden right colon. Abd [place] was unremarkable except for cholelithiasis. No evidence of UTI and C diff here was negative. Mother earlier refused Heparin for DVT prophylaxis   Hospital Course:  Acute respiratory failure due to:  a) OSA on CPAP  b ) Aspiration pneumonia  -PCCM transitioned to nocturnal CPAP on 10/27- tol.well -transitioned to home BiPAP machine 24 hours prior to dc and did well -completed 8 days of Zosyn  -cont supportive care after discharge -mom did witness pt belch with reflux of gastric contents within 15 minutes of medication administration. This was later followed pt pt coughing up similar appearing sputum associated with transient hypoxia.  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 Sharene [name] has advised pt receive portacath for labs (very difficult stick) and also she feels he will need access for comfort meds eventually  -IR consulted and port a cath was implanted 10/28 -mother is aware pt now in terminal stages of his chronic illness- she is very overwhelmed and vacillates between acceptance and denial  -she also has multiple health problems and at times has deflected all conversations back to herself. On date of dc she was more focused on the patient - she  appears desperately to need respite care but also appears unwilling/having difficulty letting go of primary caregiver role   Diarrhea  -despite regular Miralax at home pt had focal obstipation  which lead to diarrhea pre admit (mother adamant this could not be possible) -continue Miralax after dc   Procedures:  Implantation port a cath by interventional radiology  10/28  Consultations:  PCCM  Interventional Radiology  Discharge Exam: Filed Vitals:   11/29/12 1100  BP: 103/65  Pulse: 70  Temp: 98.4 F (36.9 C)  Resp: 11   General: No acute respiratory distress  Lungs: Clear to auscultation bilaterally without wheezes or crackles, 4L  Cardiovascular: Regular rate and rhythm without murmur gallop or rub normal S1 and S2, no peripheral edema or JVD  Abdomen: Nontender, nondistended, soft, bowel sounds positive, no rebound, no ascites, no appreciable mass  Musculoskeletal: No significant cyanosis, clubbing of bilateral lower extremities  Neurological: Alert, confined to a specialized wheelchair, has lower extremity splints in place, generalized atrophy was stable spastic quadriparesis   Discharge Instructions      Discharge Orders   Future Appointments Provider Department Dept Phone   12/25/2012 10:30 AM Jeffery Frisk, MD [facility] [place] [facility] [phone]   12/26/2012 12:00 PM Jeffery Perla, MD [facility] [phone]   Future Orders Complete By Expires   Call MD for:  difficulty breathing, headache or visual disturbances  As directed    Call MD for:  temperature >100.4  As directed    Diet general  As directed    Scheduling Instructions:     Resume previous bolus feeds Recommend administer meds and feeds with head elevated at least 30-46 degrees with optimum positioning being up in specialized wheelchair   Increase activity slowly  As directed        Medication List         acetaminophen 160 MG/5ML solution  Commonly known as:  TYLENOL  Place 10.2 mLs (325 mg total) into feeding tube every 4 (four) hours as needed for fever.     albuterol (2.5 MG/3ML) 0.083% nebulizer solution  Commonly known as:  PROVENTIL  Take 2.5  mg by nebulization 4 (four) times daily as needed for shortness of breath. 1 ampule twice daily and as needed (making 4 doses daily)     albuterol 108 (90 BASE) MCG/ACT inhaler  Commonly known as:  PROVENTIL HFA;VENTOLIN HFA  Inhale 2 puffs into the lungs every 4 (four) hours as needed for shortness of breath.     baclofen 32440 MCG/20ML Soln  Commonly known as:  GABLOFEN  326 mcg by Intrathecal route continuous.     BOTOX IM  Inject 1 mL into the muscle every 3 (three) months. Last taken on 10-11-12     calcium carbonate (dosed in mg elemental calcium) 1250 MG/5ML  1,000 mg of elemental calcium by PEG Tube route 2 (two) times daily. 4 cc via peg tube - ON HOLD UNTIL 11/17/12 PER DR. [name]     ENSURE PLUS Liqd  Take 237 mLs by mouth 4 (four) times daily.     feeding supplement (PRO-STAT SUGAR FREE 64) Liqd  Place 30 mLs into feeding tube 2 (two) times daily.     folic acid 1 MG tablet  Commonly known as:  FOLVITE  Take 1 mg by mouth daily.     free water Soln  Place 100-200 mLs into feeding tube 5 (five) times daily.     gabapentin 250 MG/5ML solution  Commonly known as:  NEURONTIN  Take 800 mg by mouth 3 (three) times daily.     lansoprazole 30 MG disintegrating tablet  Commonly known as:  PREVACID SOLUTAB  Take 30 mg by mouth daily.     levETIRAcetam 100 MG/ML solution  Commonly known as:  KEPPRA  Give 17.5 mls by PEG tube twice per day.     loperamide 2 MG tablet  Commonly known as:  IMODIUM A-D  Take 2 mg by mouth 4 (four) times daily as needed for diarrhea or loose stools.     multivitamin Liqd  10 mLs by PEG Tube route daily.     mupirocin ointment 2 %  Commonly known as:  BACTROBAN  Apply 1 application topically as needed (to G-T site).     nystatin cream  Commonly known as:  MYCOSTATIN  Apply topically 2 (two) times daily.     OVER THE COUNTER MEDICATION  Apply 1 application topically as needed (after diaper changes). Balmex and Bag Balm paste made and  applied after diaper changes     OXYGEN-HELIUM IN  Inhale into the lungs as needed.     sucralfate 1 G tablet  Commonly known as:  CARAFATE  1 g by PEG Tube route 4 (four) times daily. 30 minutest before meals and 1 at bedtime     VITAMIN B-12 IJ  Inject 1 mL as directed every 30 (thirty) days.     Vitamin C 500 MG/5ML Liqd  500 mg by PEG Tube route at bedtime.     ZINC PO  5 mLs by PEG Tube route daily. 244 mg / 5 mL zinc solution       Allergies  Allergen Reactions  . Adhesive [Tape]     Rips skin off  . Depakote [Divalproex Sodium]     Causes pancreatitis    Follow-up Information   Follow up with [name], DAVID C, MD. Call in 2 weeks. (Sooner if problems)    Specialty:  Internal Medicine   Contact information:   [address] [name] [address] [phone]      Microbiology: Recent Results (from the past 240 hour(s))  CULTURE, BLOOD (ROUTINE X 2)     Status: None   Collection Time    11/21/12 10:16 PM      Result Value Range Status   Specimen Description BLOOD WRIST LEFT   Final   Special Requests BOTTLES DRAWN AEROBIC ONLY [name]   Final   Culture  Setup Time     Final   Value: 11/22/2012 05:55     Performed at [facility]   Culture     Final   Value: NO GROWTH 5 DAYS     Performed at [facility]   Report Status 11/28/2012 FINAL   Final  CULTURE, BLOOD (ROUTINE X 2)     Status: None   Collection Time    11/21/12 10:23 PM      Result Value Range Status   Specimen Description BLOOD HAND LEFT   Final   Special Requests BOTTLES DRAWN AEROBIC ONLY [facility]CC   Final   Culture  Setup Time     Final   Value: 11/22/2012 05:55     Performed at [facility]   Culture     Final   Value: NO GROWTH 5 DAYS     Performed at [facility]   Report Status 11/28/2012 FINAL   Final  MRSA PCR SCREENING  Status: None   Collection Time    11/21/12 11:05 PM      Result Value Range Status   MRSA by PCR NEGATIVE  NEGATIVE Final    Comment:            The GeneXpert MRSA Assay (FDA     approved for NASAL specimens     only), is one component of a     comprehensive MRSA colonization     surveillance program. It is not     intended to diagnose MRSA     infection nor to guide or     monitor treatment for     MRSA infections.  CLOSTRIDIUM DIFFICILE BY PCR     Status: None   Collection Time    11/22/12 12:04 PM      Result Value Range Status   C difficile by pcr NEGATIVE  NEGATIVE Final  URINE CULTURE     Status: None   Collection Time    11/24/12  2:12 PM      Result Value Range Status   Specimen Description URINE, CLEAN CATCH   Final   Special Requests NONE   Final   Culture  Setup Time     Final   Value: 11/24/2012 14:45     Performed at [facility] Count     Final   Value: NO GROWTH     Performed at [facility]   Culture     Final   Value: NO GROWTH     Performed at [facility]   Report Status 11/25/2012 FINAL   Final     Labs: Basic Metabolic Panel:  Recent Labs Lab 11/23/12 0500 11/25/12 0535  NA 138 138  K 3.4* 3.8  CL 101 98  CO2 30 32  GLUCOSE 89 96  BUN 10 11  CREATININE 0.55 0.62  CALCIUM 8.4 9.4  MG 2.0 2.0  PHOS 3.5 3.8   CBC:  Recent Labs Lab 11/23/12 0500 11/25/12 0535  WBC 4.1 3.5*  HGB 12.7* 13.4  HCT 39.3 41.6  MCV 100.3* 100.7*  PLT 100* 105*   CBG:  Recent Labs Lab 11/28/12 0025 11/28/12 0421 11/28/12 0823 11/28/12 1243 11/28/12 1736  GLUCAP 111* 88 76 101* 108*   Signed:  ELLIS,ALLISON L. ANP  [facility] 11/29/2012, 1:17 PM   I have personally examined this patient and reviewed the entire database. I have reviewed the above note, made any necessary editorial changes, and agree with its content.  Lonia Blood, MD [facility]

## 2012-12-01 ENCOUNTER — Telehealth: Payer: Self-pay | Admitting: Critical Care Medicine

## 2012-12-01 NOTE — Telephone Encounter (Signed)
lmomtcb x1 for Jeffery [name] 

## 2012-12-01 NOTE — Telephone Encounter (Signed)
pts mother called back and she stated that the pt was admitted to the hospital on 10/21 and d/c 10/24.   She stated that the cxr was clear but the ct abd showed PNA.  He was treated for this.  pts mother wanted to know if they should move the appt up with PW or keep the appt on 11/24.  He has already had a f/u with [name]. pts mother is having surgery on Tuesday and we can call her husband Chase Picket at [phone].   PW please advise if we need to move the appt up for the pt.    Allergies  Allergen Reactions  . Adhesive [Tape]     Rips skin off  . Depakote [Divalproex Sodium]     Causes pancreatitis       Current Outpatient Prescriptions on File Prior to Visit  Medication Sig Dispense Refill  . acetaminophen (TYLENOL) 160 MG/5ML solution Place 10.2 mLs (325 mg total) into feeding tube every 4 (four) hours as needed for fever.  120 mL  0  . albuterol (PROVENTIL HFA;VENTOLIN HFA) 108 (90 BASE) MCG/ACT inhaler Inhale 2 puffs into the lungs every 4 (four) hours as needed for shortness of breath.      Marland Kitchen albuterol (PROVENTIL) (2.5 MG/3ML) 0.083% nebulizer solution Take 2.5 mg by nebulization 4 (four) times daily as needed for shortness of breath. 1 ampule twice daily and as needed (making 4 doses daily)      . Ascorbic Acid (VITAMIN C) 500 MG/5ML LIQD 500 mg by PEG Tube route at bedtime.       . baclofen (GABLOFEN) 40000 MCG/20ML SOLN 326 mcg by Intrathecal route continuous.       . calcium carbonate, dosed in mg elemental calcium, 1250 MG/5ML 1,000 mg of elemental calcium by PEG Tube route 2 (two) times daily. 4 cc via peg tube - ON HOLD UNTIL 11/17/12 PER DR. [name]      . Cyanocobalamin (VITAMIN B-12 IJ) Inject 1 mL as directed every 30 (thirty) days.       . ENSURE PLUS (ENSURE PLUS) LIQD Take 237 mLs by mouth 4 (four) times daily.       . feeding supplement (PRO-STAT SUGAR FREE 64) LIQD Place 30 mLs into feeding tube 2 (two) times daily.  900 mL  0  . folic acid (FOLVITE) 1 MG tablet Take 1  mg by mouth daily.      Marland Kitchen gabapentin (NEURONTIN) 250 MG/5ML solution Take 800 mg by mouth 3 (three) times daily.      . lansoprazole (PREVACID SOLUTAB) 30 MG disintegrating tablet Take 30 mg by mouth daily.       Marland Kitchen levETIRAcetam (KEPPRA) 100 MG/ML solution Give 17.5 mls by PEG tube twice per day.  1200 mL  5  . loperamide (IMODIUM A-D) 2 MG tablet Take 2 mg by mouth 4 (four) times daily as needed for diarrhea or loose stools.      . Multiple Vitamin (MULTIVITAMIN) LIQD 10 mLs by PEG Tube route daily.      . Multiple Vitamins-Minerals (ZINC PO) 5 mLs by PEG Tube route daily. 244 mg / 5 mL zinc solution      . mupirocin ointment (BACTROBAN) 2 % Apply 1 application topically as needed (to G-T site).  22 g  1  . nystatin cream (MYCOSTATIN) Apply topically 2 (two) times daily.  30 g  0  . OnabotulinumtoxinA (BOTOX IM) Inject 1 mL into the muscle every 3 (three) months. Last taken  on 10-11-12      . OVER THE COUNTER MEDICATION Apply 1 application topically as needed (after diaper changes). Balmex and Bag Balm paste made and applied after diaper changes      . OXYGEN-HELIUM IN Inhale into the lungs as needed.      . sucralfate (CARAFATE) 1 G tablet 1 g by PEG Tube route 4 (four) times daily. 30 minutest before meals and 1 at bedtime      . Water For Irrigation, Sterile (FREE WATER) SOLN Place 100-200 mLs into feeding tube 5 (five) times daily.       Current Facility-Administered Medications on File Prior to Visit  Medication Dose Route Frequency Provider Last Rate Last Dose  . botulinum toxin Type A (BOTOX) injection 300 Units  300 Units Intramuscular Once Levert Feinstein, MD

## 2012-12-02 NOTE — Telephone Encounter (Signed)
Keep 11/24 appt with me and sooner appt with talbot

## 2012-12-04 NOTE — Telephone Encounter (Signed)
I spoke with pt mother and is aware. Nothing further needed

## 2012-12-11 ENCOUNTER — Telehealth: Payer: Self-pay | Admitting: Family

## 2012-12-11 NOTE — Telephone Encounter (Signed)
I know that technically, she should be more skilled than the Loftis family.  What happens if IV benzos depress his respiration.  I will have to talk to her on my return.

## 2012-12-11 NOTE — Telephone Encounter (Signed)
[name] called and said that Jeffery [name] now has a Personal assistant. She said that he has an order for Diastat rectal gel 12.5mg  for seizures that last 5 minutes or if compromised by the seizure. She said that if she or Dad is alone with Jeffery [name] and he is in his chair that it would take a lot more than 5 minutes to get him out of chair to give him Diastat because it takes more than that for 2 people to get him out of the chair. She wonders if she could get injectable Diazepam to give him now that he has a Portacath. I called Jeffery [name] and told her that Dr Jeffery [name] is not in the office today and that he and I would discuss this tomorrow. She agreed with this plan. [name]' number is [phone]. TG

## 2012-12-12 NOTE — Telephone Encounter (Signed)
I spoke with mother.  She is going to check and see what size needles necessary to access the port and the medication.  We talked about the possibility of respiratory depression with the use of benzodiazepine.  I also told her that I wanted him treated within 2 minutes of onset of his seizure.  That means that she'll have to start prepping at 1 minute for that she can give the medication.  We talked about the possibility of infection.  If he is lying in bed, rectal Diastat may be just as safe and just as effective as accessing the port so that I think that we should renew that because there will be times when it may be useful. It is not going to be possible to get him out of his chair when he has a seizure in the chair in a timely timely basis and will then be better to use the IV port.

## 2012-12-22 ENCOUNTER — Other Ambulatory Visit: Payer: Self-pay | Admitting: Family

## 2012-12-22 MED ORDER — GABAPENTIN 250 MG/5ML PO SOLN: ORAL | Status: DC

## 2012-12-25 ENCOUNTER — Emergency Department (HOSPITAL_BASED_OUTPATIENT_CLINIC_OR_DEPARTMENT_OTHER)
Admission: EM | Admit: 2012-12-25 | Discharge: 2012-12-25 | Disposition: A | Discharge status: Home / Self Care | Payer: Medicaid Other | Attending: Emergency Medicine | Admitting: Emergency Medicine

## 2012-12-25 ENCOUNTER — Encounter: Payer: Self-pay | Admitting: Critical Care Medicine

## 2012-12-25 ENCOUNTER — Ambulatory Visit (INDEPENDENT_AMBULATORY_CARE_PROVIDER_SITE_OTHER): Payer: Medicaid Other | Admitting: Critical Care Medicine

## 2012-12-25 ENCOUNTER — Encounter (HOSPITAL_BASED_OUTPATIENT_CLINIC_OR_DEPARTMENT_OTHER): Payer: Self-pay | Admitting: Emergency Medicine

## 2012-12-25 VITALS — BP 124/76 | HR 128 | Temp 98.0°F

## 2012-12-25 DIAGNOSIS — Z87828 Personal history of other (healed) physical injury and trauma: Secondary | ICD-10-CM | POA: Insufficient documentation

## 2012-12-25 DIAGNOSIS — Z8701 Personal history of pneumonia (recurrent): Secondary | ICD-10-CM | POA: Insufficient documentation

## 2012-12-25 DIAGNOSIS — Z931 Gastrostomy status: Secondary | ICD-10-CM | POA: Insufficient documentation

## 2012-12-25 DIAGNOSIS — M810 Age-related osteoporosis without current pathological fracture: Secondary | ICD-10-CM | POA: Insufficient documentation

## 2012-12-25 DIAGNOSIS — J690 Pneumonitis due to inhalation of food and vomit: Secondary | ICD-10-CM

## 2012-12-25 DIAGNOSIS — G4730 Sleep apnea, unspecified: Secondary | ICD-10-CM | POA: Insufficient documentation

## 2012-12-25 DIAGNOSIS — E860 Dehydration: Secondary | ICD-10-CM | POA: Insufficient documentation

## 2012-12-25 DIAGNOSIS — R21 Rash and other nonspecific skin eruption: Secondary | ICD-10-CM | POA: Insufficient documentation

## 2012-12-25 DIAGNOSIS — M2450 Contracture, unspecified joint: Secondary | ICD-10-CM | POA: Insufficient documentation

## 2012-12-25 DIAGNOSIS — Z87718 Personal history of other specified (corrected) congenital malformations of genitourinary system: Secondary | ICD-10-CM | POA: Insufficient documentation

## 2012-12-25 DIAGNOSIS — G40309 Generalized idiopathic epilepsy and epileptic syndromes, not intractable, without status epilepticus: Secondary | ICD-10-CM | POA: Insufficient documentation

## 2012-12-25 DIAGNOSIS — Z792 Long term (current) use of antibiotics: Secondary | ICD-10-CM | POA: Insufficient documentation

## 2012-12-25 DIAGNOSIS — Z79899 Other long term (current) drug therapy: Secondary | ICD-10-CM | POA: Insufficient documentation

## 2012-12-25 DIAGNOSIS — Z87442 Personal history of urinary calculi: Secondary | ICD-10-CM | POA: Insufficient documentation

## 2012-12-25 DIAGNOSIS — Z87440 Personal history of urinary (tract) infections: Secondary | ICD-10-CM | POA: Insufficient documentation

## 2012-12-25 LAB — BASIC METABOLIC PANEL
BUN: 24 mg/dL — ABNORMAL HIGH (ref 6–23)
CO2: 30 mEq/L (ref 19–32)
Chloride: 102 mEq/L (ref 96–112)
Glucose, Bld: 89 mg/dL (ref 70–99)
Potassium: 3.7 mEq/L (ref 3.5–5.1)

## 2012-12-25 MED ORDER — SODIUM CHLORIDE 0.9 % IV BOLUS (SEPSIS)
1000.0000 mL | Freq: Once | INTRAVENOUS | Status: AC
  Administered 2012-12-25: 1000 mL via INTRAVENOUS

## 2012-12-25 MED ORDER — LIDOCAINE 4 % EX CREA
TOPICAL_CREAM | CUTANEOUS | Status: AC
  Filled 2012-12-25: qty 5

## 2012-12-25 MED ORDER — LEVOFLOXACIN 500 MG PO TABS: 500.0000 mg | ORAL_TABLET | Freq: Every day | ORAL | Status: DC

## 2012-12-25 NOTE — ED Provider Notes (Signed)
CSN: [id number]     Arrival date & time 12/25/12  1159 History   First MD Initiated Contact with Patient 12/25/12 1209     Chief Complaint  Patient presents with  . Dehydration   (Consider location/radiation/quality/duration/timing/severity/associated sxs/prior Treatment) HPI Comments: Pt with history of cerebral palsy. They were sent down from Dr. Florene Route office for dehydration.pt has required fluid intermittently over the last couple of months:pt has had diarrhea that is now only happening 2 times a day as compared to 10:no fever:pt is being treated for pneumonia   The history is provided by a parent.    Past Medical History  Diagnosis Date  . CP (cerebral palsy), spastic, quadriplegic   . Epilepsy   . Osteoporosis   . Inguinal hernia   . Undescended testes   . Seasonal allergies   . IVH (intraventricular hemorrhage)     Grade IV  . Hip dislocation, bilateral   . Seizures   . Hx: UTI (urinary tract infection)   . Dysphagia   . Otitis media   . Retinopathy of prematurity   . 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 Neuromuscular scoliosis   . Osteoporosis   . Complex partial seizures   . Generalized convulsive epilepsy without mention of intractable epilepsy   . Epilepsy   . Sinus bradycardia     HR drops to 38-40 while sleeping  . Sleep apnea     BiPAP  . Blister of right heel     fluid filled; origin unknown  . Kidney stones     ?  Marland Kitchen Pneumonia      chronic pneumonia ,respitory failure dx Augest 2014   Past Surgical History  Procedure Laterality Date  . Excision of moles    . Peg placement      With multiple changes  . Gastrostomy tube, change / reposition  12/15/2010       . Button change  12/15/2010    Procedure: BUTTON CHANGE;  Surgeon: Iva Boop, MD;  Location: [facility];  Service: Endoscopy;  Laterality: N/A;  . Peg placement  10/07/2011    Procedure: PERCUTANEOUS ENDOSCOPIC GASTROSTOMY (PEG) REPLACEMENT;  Surgeon: Hart Carwin, MD;  Location:  [facility];  Service: Endoscopy;  Laterality: N/A;  Needs 18 F 2.5 button ordered-dl  . Inguinal hernia repair Bilateral 1992  . Retinopathy of prematurity surgery  1992  . Achilles tendon lengthening  12/1998  . Soft tissue releases  wrists and fingers  12/1998  . Intrathecal baclofen pump placement  07/25/2000  . Peg placement N/A 06/05/2012    Procedure: PERCUTANEOUS ENDOSCOPIC GASTROSTOMY (PEG) REPLACEMENT;  Surgeon: Hart Carwin, MD;  Location: [facility];  Service: Endoscopy;  Laterality: N/A;  button 23fr.2.5cm  . Eye surgery Bilateral     Retinal   . Back surgery      Harrington Rods in back needs to be log rolled  . Tendon repair Bilateral 09/05/2012    Procedure: LENGTHENING OF DIGITAL FLEXOR TENDONS BILTERAL HANDS;  Surgeon: Jodi Marble, MD;  Location: [facility] OR;  Service: Orthopedics;  Laterality: Bilateral;  . Hand surgery Bilateral Aug. 2014  . Peg placement N/A 09/13/2012    Procedure: PERCUTANEOUS ENDOSCOPIC GASTROSTOMY (PEG) REPLACEMENT;  Surgeon: Hart Carwin, MD;  Location: [facility];  Service: Endoscopy;  Laterality: N/A;  . Flexible sigmoidoscopy N/A 10/30/2012    Procedure: FLEXIBLE SIGMOIDOSCOPY;  Surgeon: Florencia Reasons, MD;  Location: [facility];  Service: Endoscopy;  Laterality: N/A;  . Peg  placement N/A 11/15/2012    Procedure: PERCUTANEOUS ENDOSCOPIC GASTROSTOMY (PEG) REPLACEMENT;  Surgeon: Petra Kuba, MD;  Location: [facility];  Service: Endoscopy;  Laterality: N/A;   Family History  Problem Relation Age of Onset  . Adopted: Yes   History  Substance Use Topics  . Smoking status: Never Smoker   . Smokeless tobacco: Never Used  . Alcohol Use: No    Review of Systems  Allergies  Adhesive and Depakote  Home Medications   Current Outpatient Rx  Name  Route  Sig  Dispense  Refill  . acetaminophen (TYLENOL) 160 MG/5ML solution   Per Tube   Place 10.2 mLs (325 mg total) into feeding tube every 4 (four) hours as needed for fever.   120 mL    0   . albuterol (PROVENTIL HFA;VENTOLIN HFA) 108 (90 BASE) MCG/ACT inhaler   Inhalation   Inhale 2 puffs into the lungs every 4 (four) hours as needed for shortness of breath.         Marland Kitchen albuterol (PROVENTIL) (2.5 MG/3ML) 0.083% nebulizer solution   Nebulization   Take 2.5 mg by nebulization. 1 ampule twice daily and as needed (making 4 doses daily)         . Ascorbic Acid (VITAMIN C) 500 MG/5ML LIQD   PEG Tube   500 mg by PEG Tube route at bedtime.          . baclofen (GABLOFEN) 40000 MCG/20ML SOLN   Intrathecal   326 mcg by Intrathecal route continuous.          . calcium carbonate, dosed in mg elemental calcium, 1250 MG/5ML   PEG Tube   1,000 mg of elemental calcium by PEG Tube route 2 (two) times daily. 4 cc via peg tube - ON HOLD UNTIL 11/17/12 PER DR. [name]         . Cyanocobalamin (VITAMIN B-12 IJ)   Injection   Inject 1 mL as directed every 30 (thirty) days.          . Diazepam (DIASTAT RE)   Rectal   Place rectally as needed.         . dicyclomine (BENTYL) 10 MG/5ML syrup   Oral   Take by mouth every 8 (eight) hours as needed. Not to exceed 5 doses in 1 wk         . ENSURE PLUS (ENSURE PLUS) LIQD   Oral   Take 237 mLs by mouth 4 (four) times daily.          . feeding supplement (PRO-STAT SUGAR FREE 64) LIQD   Per Tube   Place 30 mLs into feeding tube 2 (two) times daily.   900 mL   0   . folic acid (FOLVITE) 1 MG tablet   Oral   Take 1 mg by mouth daily.         . furosemide (LASIX) 10 MG/ML solution   Per Tube   Place 20 mg into feeding tube daily as needed.          . gabapentin (NEURONTIN) 250 MG/5ML solution      Take 16 ml 3 times per day   1632 mL   5   . lansoprazole (PREVACID SOLUTAB) 30 MG disintegrating tablet   Oral   Take 30 mg by mouth daily.          Marland Kitchen levETIRAcetam (KEPPRA) 100 MG/ML solution      Give 17.5 mls by PEG tube twice per day.  1200 mL   5     Dispense as written.    Brand Medically  Necessary   . levofloxacin (LEVAQUIN) 500 MG tablet   Oral   Take 1 tablet (500 mg total) by mouth daily. Crush and place into feeding tube   10 tablet   0   . loperamide (IMODIUM A-D) 2 MG tablet   Oral   Take 2 mg by mouth 4 (four) times daily as needed for diarrhea or loose stools.         . Multiple Vitamin (MULTIVITAMIN) LIQD   PEG Tube   10 mLs by PEG Tube route daily.         . Multiple Vitamins-Minerals (ZINC PO)   PEG Tube   5 mLs by PEG Tube route daily. 244 mg / 5 mL zinc solution         . mupirocin ointment (BACTROBAN) 2 %   Topical   Apply 1 application topically as needed (to G-T site).   22 g   1   . nystatin cream (MYCOSTATIN)   Topical   Apply topically. 3-4 times daily         . OnabotulinumtoxinA (BOTOX IM)   Intramuscular   Inject 1 mL into the muscle every 3 (three) months. Last taken on 10-11-12         . OVER THE COUNTER MEDICATION   Topical   Apply 1 application topically as needed (after diaper changes). Balmex and Bag Balm paste made and applied after diaper changes         . Pseudoephedrine HCl (SUDAFED CHILDRENS) 15 MG/5ML LIQD   Tube   Give 15 mLs by tube every 6 (six) hours.         . Water For Irrigation, Sterile (FREE WATER) SOLN   Per Tube   Place 100-200 mLs into feeding tube 5 (five) times daily.          BP 98/78  Pulse 106  Temp(Src) 98.3 F (36.8 C) (Axillary)  Resp 24  Wt 116 lb (52.617 kg)  SpO2 92% Physical Exam  Cardiovascular: Normal rate and regular rhythm.   Pulmonary/Chest: He has rales.  Musculoskeletal:  Contracture in all extremities  Skin:  Redness noted in the diaper area    ED Course  Procedures (including critical care time) Labs Review Labs Reviewed  BASIC METABOLIC PANEL - Abnormal; Notable for the following:    BUN 24 (*)    All other components within normal limits   Imaging Review No results found.  EKG Interpretation   None       MDM   1. Dehydration    Pt  given a litter of fluids:nothing further to do at this time    Teressa Lower, NP 12/25/12 1452

## 2012-12-25 NOTE — Progress Notes (Signed)
Subjective:    Patient ID: Jeffery [name], male    DOB: [birth year], 22 y.o.   MRN: [id number]  HPI  22 y.o.M   11/07/12  Acute OV  Pt presents to office today with parents.  Mother informs me that his fever up to 101.1 this morning @ 0730.  began prednisone yesterday PM.  prod cough with thick whitish-yellow mucus last evening.  pt is fasting this morning.  pt mother reports "he is just not himself" Pt has hx of cerebral palsy was admitted recently in August with aspiration PNA. He was seen last month for follow up cxr with resolution of PNA.  Over last 3 days mom says pt has been more lethargic with tachycardia, congestion despite vibra vest. And has run low grade fevers with tmax this am at 101.  He was started on Augmentin last week . Pt has also been dealing with ongoing GI issues, with G-tube and chronic diarrhea. He is followed by Dr. Randa Evens in GI. Mother reports C . Diff x 3 were neg. However diarrhea persists.  Phone message yesterday with recs to stop Augmentin , vitamins and begin florastor . He was taken off the vibra vest and started on Prednisone taper.  Mother denies that patient has had any vomiting, bloody stools, leg swelling, or rash. mother and father are primary caregivers  Was seen in ER on 11/04/12 with neg cxr , ngtd BC  And nml wbc. Labs did show BUN tr up.   11/13/2012 Chief Complaint  Patient presents with  . Follow-up    Has "bronchial" spasm approx 1 per day over the past 2 days but handles it well.  Cough with clear mucus.  No fever x 2 days.  Pt went back to ED 11/08/12 after seen by NP on 11/07/12. More IVF given. Was not admitted. Diarrheal issues ongoing.  Note distal wall of rectum and sigmoid inflamed 09/2012 GI did a rectal bx 9/29/4 No CA or infection  Wall was normal No dx given as of yet.  Ensure is better than jevity Gets 4 cans of ensure per day.  Prostat for protein. Spasms only once per day, has to cough and works so much to get it up. Suctions in  the mouth.  Not using vibravest Pt has less diarrhea,  Large and not massive.    12/25/2012 Chief Complaint  Patient presents with  . Follow-up    Weekend was tough.  Mom reports pt was dx'd with 10/14 with bilateral pna and on 10/29 he was in [facility] with pna.  Sats dropping into the 70s this weeks off and on.  Has seizure > 2 minutes this weekend as well.    This patient was in the hospital for 2 weeks in October 2014 with bilateral lower lobe pneumonia. The patient was given a day course of Zosyn and then discharged to home. The patient had a chest x-ray done November 14 which showed lower lobe infiltrates and a course of Augmentin was given by the primary care provider. Note laboratory data has been obtained and revealed a normal white count at 6300. The family notes over the weekend 1 two-minute seizure and episodes of hypoxemia. Mucus is now pale yellow. There is increased congestion. The patient has 2 loose stools a day. The patient has no real fever. The patient is using bilevel at bedtime and as needed during the daytime. The cough is excessive in nature. The patient is not able to use a fiber best. Heart rate  is been elevated. The patient's family is requesting intravenous IV fluids at this visit.   Past Medical History  Diagnosis Date  . CP (cerebral palsy), spastic, quadriplegic   . Epilepsy   . Osteoporosis   . Inguinal hernia   . Undescended testes   . Seasonal allergies   . IVH (intraventricular hemorrhage)     Grade IV  . Hip dislocation, bilateral   . Seizures   . Hx: UTI (urinary tract infection)   . Dysphagia   . Otitis media   . Retinopathy of prematurity   . 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 Neuromuscular scoliosis   . Osteoporosis   . Complex partial seizures   . Generalized convulsive epilepsy without mention of intractable epilepsy   . Epilepsy   . Sinus bradycardia     HR drops to 38-40 while sleeping  . Sleep apnea     BiPAP  . Blister of right  heel     fluid filled; origin unknown  . Kidney stones     ?  Marland Kitchen Pneumonia      chronic pneumonia ,respitory failure dx Augest 2014     Family History  Problem Relation Age of Onset  . Adopted: Yes     History   Social History  . Marital Status: Single    Spouse Name: N/A    Number of Children: N/A  . Years of Education: N/A   Occupational History  . Student     Social History Main Topics  . Smoking status: Never Smoker   . Smokeless tobacco: Never Used  . Alcohol Use: No  . Drug Use: No  . Sexual Activity: No   Other Topics Concern  . Not on file   Social History Narrative  . No narrative on file     Allergies  Allergen Reactions  . Adhesive [Tape]     Rips skin off  . Depakote [Divalproex Sodium]     Causes pancreatitis      Facility-Administered Medications Prior to Visit  Medication Dose Route Frequency Provider Last Rate Last Dose  . botulinum toxin Type A (BOTOX) injection 300 Units  300 Units Intramuscular Once Levert Feinstein, MD       Outpatient Prescriptions Prior to Visit  Medication Sig Dispense Refill  . acetaminophen (TYLENOL) 160 MG/5ML solution Place 10.2 mLs (325 mg total) into feeding tube every 4 (four) hours as needed for fever.  120 mL  0  . albuterol (PROVENTIL HFA;VENTOLIN HFA) 108 (90 BASE) MCG/ACT inhaler Inhale 2 puffs into the lungs every 4 (four) hours as needed for shortness of breath.      Marland Kitchen albuterol (PROVENTIL) (2.5 MG/3ML) 0.083% nebulizer solution Take 2.5 mg by nebulization. 1 ampule twice daily and as needed (making 4 doses daily)      . Ascorbic Acid (VITAMIN C) 500 MG/5ML LIQD 500 mg by PEG Tube route at bedtime.       . baclofen (GABLOFEN) 40000 MCG/20ML SOLN 326 mcg by Intrathecal route continuous.       . calcium carbonate, dosed in mg elemental calcium, 1250 MG/5ML 1,000 mg of elemental calcium by PEG Tube route 2 (two) times daily. 4 cc via peg tube - ON HOLD UNTIL 11/17/12 PER DR. [name]      . Cyanocobalamin (VITAMIN  B-12 IJ) Inject 1 mL as directed every 30 (thirty) days.       . ENSURE PLUS (ENSURE PLUS) LIQD Take 237 mLs by mouth 4 (four) times  daily.       . feeding supplement (PRO-STAT SUGAR FREE 64) LIQD Place 30 mLs into feeding tube 2 (two) times daily.  900 mL  0  . folic acid (FOLVITE) 1 MG tablet Take 1 mg by mouth daily.      Marland Kitchen gabapentin (NEURONTIN) 250 MG/5ML solution Take 16 ml 3 times per day  1632 mL  5  . lansoprazole (PREVACID SOLUTAB) 30 MG disintegrating tablet Take 30 mg by mouth daily.       Marland Kitchen levETIRAcetam (KEPPRA) 100 MG/ML solution Give 17.5 mls by PEG tube twice per day.  1200 mL  5  . loperamide (IMODIUM A-D) 2 MG tablet Take 2 mg by mouth 4 (four) times daily as needed for diarrhea or loose stools.      . Multiple Vitamin (MULTIVITAMIN) LIQD 10 mLs by PEG Tube route daily.      . Multiple Vitamins-Minerals (ZINC PO) 5 mLs by PEG Tube route daily. 244 mg / 5 mL zinc solution      . mupirocin ointment (BACTROBAN) 2 % Apply 1 application topically as needed (to G-T site).  22 g  1  . OnabotulinumtoxinA (BOTOX IM) Inject 1 mL into the muscle every 3 (three) months. Last taken on 10-11-12      . OVER THE COUNTER MEDICATION Apply 1 application topically as needed (after diaper changes). Balmex and Bag Balm paste made and applied after diaper changes      . Water For Irrigation, Sterile (FREE WATER) SOLN Place 100-200 mLs into feeding tube 5 (five) times daily.      Marland Kitchen nystatin cream (MYCOSTATIN) Apply topically 2 (two) times daily.  30 g  0  . OXYGEN-HELIUM IN Inhale into the lungs as needed.      . sucralfate (CARAFATE) 1 G tablet 1 g by PEG Tube route 4 (four) times daily. 30 minutest before meals and 1 at bedtime          Review of Systems  Review of systems taken in detail and is positive as above otherwise negative    Objective:   Physical Exam  BP 124/76  Pulse 128  Temp(Src) 98 F (36.7 C) (Axillary)  SpO2 92%  Gen: Severe cerebral palsy male sitting in motorized  wheelchair with head rest and facial strap.   ENT: No lesions,  mouth clear,  oropharynx clear, no postnasal drip  Neck: No JVD, no TMG, no carotid bruits  Lungs: No use of accessory muscles, no dullness to percussion, no wheezing noted. Coarse rhonchi both lower lung zones  Cardiovascular: Resting tachycardia, heart sounds normal, no murmur or gallops, no peripheral edema  Abdomen: soft and NT, no HSM,  BS normal  G-tube in place  Musculoskeletal: Contraction deformities and atrophy both lower extremities and upper extremitites   Neuro: alert, non focal, the patient is nonverbal but is interactive with facial expressions  Skin: Warm, no lesions or rashes  12/22/2012 labs: White count 6300 hemoglobin 17 potassium 4.3 sodium 142 creatinine 0.57 liver function tests mildly elevated  No results found for this basename: HGB, HCT, WBC, PLT,  in the last 168 hours   Recent Labs Lab 12/25/12 1335  NA 143  K 3.7  CL 102  CO2 30  GLUCOSE 89  BUN 24*  CREATININE 0.60  CALCIUM 10.0     No results found.      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 acetaminophen (TYLENOL) 160 MG/5ML solution Place 10.2 mLs (325 mg total) into feeding tube every 4 (four) hours as needed for fever.  Marland Kitchen albuterol (PROVENTIL HFA;VENTOLIN HFA) 108 (90 BASE) MCG/ACT inhaler Inhale 2 puffs into the lungs every 4 (four)  hours as needed for shortness of breath.  Marland Kitchen albuterol (PROVENTIL) (2.5 MG/3ML) 0.083% nebulizer solution Take 2.5 mg by nebulization. 1 ampule twice daily and as needed (making 4 doses daily)  . Ascorbic Acid (VITAMIN C) 500 MG/5ML LIQD 500 mg by PEG Tube route at bedtime.   . baclofen (GABLOFEN) 40000 MCG/20ML SOLN 326 mcg by Intrathecal route continuous.   . calcium carbonate, dosed in mg elemental calcium, 1250 MG/5ML 1,000 mg of elemental calcium by PEG Tube route 2 (two) times daily. 4 cc via peg tube - ON HOLD UNTIL 11/17/12 PER DR. [name]  . Cyanocobalamin (VITAMIN B-12 IJ) Inject 1 mL as directed every 30 (thirty) days.   . Diazepam (DIASTAT RE) Place rectally as needed.  . dicyclomine (BENTYL) 10 MG/5ML syrup Take by mouth every 8 (eight) hours as needed. Not to exceed 5 doses in 1 wk  . ENSURE PLUS (ENSURE PLUS) LIQD Take 237 mLs by mouth 4 (four) times daily.   . feeding supplement (PRO-STAT SUGAR FREE 64) LIQD Place 30 mLs into feeding tube 2 (two) times daily.  . folic acid (FOLVITE) 1 MG tablet Take 1 mg by mouth daily.  Marland Kitchen gabapentin (NEURONTIN) 250 MG/5ML solution Take 16 ml 3 times per day  . lansoprazole (PREVACID SOLUTAB) 30 MG disintegrating tablet Take 30 mg by mouth daily.   Marland Kitchen levETIRAcetam (KEPPRA) 100 MG/ML solution Give 17.5 mls by PEG tube twice per day.  . loperamide (IMODIUM A-D) 2 MG tablet Take 2 mg by mouth 4 (four) times daily as needed for diarrhea or loose stools.  . Multiple Vitamin (MULTIVITAMIN) LIQD 10 mLs by PEG Tube route daily.  . Multiple Vitamins-Minerals (ZINC PO) 5 mLs by PEG Tube route daily. 244 mg / 5 mL zinc solution  . mupirocin ointment (BACTROBAN) 2 % Apply 1 application topically as needed (to G-T site).  . nystatin cream (MYCOSTATIN) Apply topically. 3-4 times daily  . OnabotulinumtoxinA (BOTOX IM) Inject 1 mL into the muscle every 3 (three) months. Last taken on 10-11-12  . OVER THE COUNTER MEDICATION Apply 1 application topically as needed  (after diaper changes). Balmex and Bag Balm paste made and applied after diaper changes  . Pseudoephedrine HCl (SUDAFED CHILDRENS) 15 MG/5ML LIQD Give 15 mLs by tube every 6 (six) hours.  . Water For Irrigation, Sterile (FREE WATER) SOLN Place 100-200 mLs into feeding tube 5 (five) times daily.  . [DISCONTINUED] nystatin cream (MYCOSTATIN) Apply topically 2 (two) times daily.  . [DISCONTINUED] OXYGEN-HELIUM IN Inhale into the lungs as needed.  . furosemide (LASIX) 10 MG/ML solution Place 20 mg into feeding tube daily as needed.   Marland Kitchen levofloxacin (LEVAQUIN) 500 MG tablet Take 1 tablet (500 mg total) by mouth daily. Crush and place  into feeding tube  . [DISCONTINUED] levofloxacin (LEVAQUIN) 500 MG tablet Take 1 tablet (500 mg total) by mouth daily. Crush and place into feeding tube  . [DISCONTINUED] sucralfate (CARAFATE) 1 G tablet 1 g by PEG Tube route 4 (four) times daily. 30 minutest before meals and 1 at bedtime

## 2012-12-25 NOTE — ED Notes (Signed)
Attempted portacath access.  Line would not draw.  Emla cream applied to right chest wall

## 2012-12-25 NOTE — Assessment & Plan Note (Signed)
Recurrent aspiration pneumonia in both lower lung zones organism not specified Chronic respiratory failure due to restrictive lung disease Plan This patient is progressing the point where at least nocturnal ventilatory support with tracheostomy out of be considered but the family is not ready to accept this recommendation at this time Administer Levaquin 500 mg daily for 10 days

## 2012-12-25 NOTE — ED Notes (Signed)
Sent from Dr Florene Route office with c.o dehydrations. Mother states he is only to get a liter of fluid. He had lab work on Friday that has been reviewed by Dr Delford Field. He has a power port in his right upper chest that was flushed on Friday after blood was drawn. He has had diarrhea since August. Mother is upset because Dr Delford Field suggested pt have a trach and vent long term.

## 2012-12-25 NOTE — Patient Instructions (Signed)
Take Levaquin 500mg  per feeding tube daily, crush tablet, can put in apple sauce to flush via tube or put in free water to flush into tube, 10day supply sent to [facility] pharmacy The ED downstairs will see your son and give 1Liter IV fluids via porta cath No other medication changes have been made Give more consideration with your family on the direction you wish to go per our discussion today regarding the tracheostomy and ventilator machine, we will honor your wishes  Return 1 month

## 2012-12-26 ENCOUNTER — Encounter: Payer: Self-pay | Admitting: Pediatrics

## 2012-12-26 ENCOUNTER — Ambulatory Visit (INDEPENDENT_AMBULATORY_CARE_PROVIDER_SITE_OTHER): Payer: Medicaid Other | Admitting: Pediatrics

## 2012-12-26 VITALS — BP 84/60 | HR 108

## 2012-12-26 DIAGNOSIS — G40219 Localization-related (focal) (partial) symptomatic epilepsy and epileptic syndromes with complex partial seizures, intractable, without status epilepticus: Secondary | ICD-10-CM

## 2012-12-26 DIAGNOSIS — G808 Other cerebral palsy: Secondary | ICD-10-CM

## 2012-12-26 DIAGNOSIS — G40309 Generalized idiopathic epilepsy and epileptic syndromes, not intractable, without status epilepticus: Secondary | ICD-10-CM
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
## 2012-12-26 NOTE — ED Provider Notes (Signed)
Medical screening examination/treatment/procedure(s) were performed by non-physician practitioner and as supervising physician I was immediately available for consultation/collaboration.  EKG Interpretation   None        Issachar Broady E Bayron Dalto, MD 12/26/12 1751 

## 2012-12-27 ENCOUNTER — Encounter: Payer: Self-pay | Admitting: Pediatrics

## 2012-12-27 ENCOUNTER — Telehealth: Payer: Self-pay | Admitting: *Deleted

## 2012-12-27 NOTE — Telephone Encounter (Signed)
[place] Lions the patient's mom called and stated that the patient had his new appointment with the new PCP on today and she herself had a doctors appointment this morning at 11:00 am, she arrived at 9:15 am at the doctor's appointment for [facility] as instructed by the person who made the appointment, however she states that by 10:00 am they still had not been called back and then at around 10:15 am the assistant came out to inform them that it was going to be a little while longer before they would be able to come back to see the doctor, at that point [place] rescheduled the appointment and the soonest they could fit [name] in was Dec. 19.   [place] Lions was not happy about that and they left and had [name] inside of the car to leave when she said the someone from the office came running up to them saying that the doctor could see them now and [place] Lions stated no they were going to leave and that the appointment had already been rescheduled, [place] Lions said the lady then said well your appointment wasn't until 9:45 am and [place] Lions at that point stated no it was at 9:15 am she wrote it down in her planner when the scheduler had given her the information. [place] Lions began to cry saying that she can't believe that he can't be seen until Dec. 19 and that if he has a seizure she doesn't know what she's going to do.   [name] wanted Dr. [name] to know what has happened, however I'm not sure how this will go since the office did try to see [name] today even though it was at a later time than the original appointment time, not sure if maybe if she could have rescheduled her appointment in order to have him seen now rather than later since she's very upset about the new appointment date. [place] Lions is aware that Dr. [name] and Sula Soda. Were out of the office today and that the office will be closed for the Thanksgiving Holiday tomorrow and Friday and will reopen on Monday Dec. 1. She confirmed understanding and can be reached at [phone].    Thanks,  Belenda Cruise.

## 2013-01-01 NOTE — Telephone Encounter (Signed)
I returned the phone call and left a message for mother to call back.

## 2013-01-02 ENCOUNTER — Telehealth: Payer: Self-pay | Admitting: *Deleted

## 2013-01-02 NOTE — Telephone Encounter (Signed)
I spoke with mother for 5-1/2 minutes.  Her son is scheduled for an appointment on December 9.  Nasal Versed is another option.  She would have to purchase 10 vials and an Therapist, sports.

## 2013-01-02 NOTE — Telephone Encounter (Signed)
[place] Lions the patient's mom has called and stated that she was sorry she missed Dr. Darl Householder phone call the other day she says that she was in the ER for issues related to herself, she asked if Dr. [name] could please return her call at [phone]. MB

## 2013-01-03 NOTE — Telephone Encounter (Signed)
Mother left a message saying that she thinks that this has been worked out with [employer]. If she needs something else, she will let me know. TG

## 2013-01-09 ENCOUNTER — Telehealth: Payer: Self-pay | Admitting: Critical Care Medicine
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
## 2013-01-09 NOTE — Telephone Encounter (Signed)
This is the info i was given... Will review records

## 2013-01-16 NOTE — Telephone Encounter (Signed)
lmomtcb for [place] Lions to let her know PW has reviewed the records she faxed over.  He will be able to manage pt's pulmonary problems and bipap only.  Further questions, concerns, or problems will need to be addressed by pt's PCP or GI.

## 2013-01-16 NOTE — Telephone Encounter (Signed)
i did review the records and they are in epic

## 2013-01-16 NOTE — Telephone Encounter (Signed)
Dr. Delford Field, did you have a chance to review these records yet?

## 2013-01-16 NOTE — Telephone Encounter (Signed)
Spoke with  Lions. Explained below to her.  She verbalized understanding of this.  She is in agreement that PW should only manage pt's Pulm problems and bipap.  Reports she only wanted PW to review the GI recs/procedure notes that were faxed for his records and FYI on what did take place, which she is aware has been done.  She voiced no further questions or concerns at this time and is aware of [name]'s pending appt on Dec 23 with PW in [place].

## 2013-01-17 ENCOUNTER — Encounter: Payer: Self-pay | Admitting: Neurology

## 2013-01-17 ENCOUNTER — Ambulatory Visit (INDEPENDENT_AMBULATORY_CARE_PROVIDER_SITE_OTHER): Payer: Medicaid Other | Admitting: Neurology

## 2013-01-17 DIAGNOSIS — G8250 Quadriplegia, unspecified: Secondary | ICD-10-CM

## 2013-01-17 DIAGNOSIS — G808 Other cerebral palsy: Secondary | ICD-10-CM

## 2013-01-18 MED ORDER — ONABOTULINUMTOXINA 100 UNITS IJ SOLR
300.0000 [IU] | Freq: Once | INTRAMUSCULAR | Status: AC
  Administered 2013-01-18: 300 [IU] via INTRAMUSCULAR

## 2013-01-18 NOTE — Progress Notes (Signed)
HPI:   Gerrell came in for EMG guided BOTOX injection for bilateral upper extremity spasticity, accompanied by his mother.  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 His mother adopt him and his twin sister since 45 months old.  He is wheelchair bound, depend on PEG tube feeding, he can communicate some with facial expression, but non verbal. He has severe nonfunctional spastic qudraplegia, s/p baclofen bump placement at [facility]. Dr [name] refills his Baclofen bump, which helps his lower extremity spasticity, it is much easier for care giver to change his diaper, other wise he has frequent bilateral lower extremity spasticity, clonus, and hip pain.  He has been receiving BOTOX A injection from [facility] Dr. Yvone Neu since 2000, q3-4 months, it has been very helpful, his upper extremity is more relaxed, it is easier to put on cloth and clean his hands after injection, last injection from faxed over note was in 09/26/2009, 300 units were injected into bilateral biceps, brachioradialis, pronator teres, ECU, FDP, mother wants to change care locally for convience.  Mother reported benefit usually lasts less than 3 months, she would then noticed increased difficulty to clean his hands, he also had some improvement with his grip with BOTOX A,   He also has bilateral forearm deep tendon release surgery for his bilateral hands finger flexions in the summer of 2014  UPDATE 01/18/2013: Last EMG guided Botox injection was in Sep 2014, he received 300 units of Botox a divided into right and left upper extremities. he did very well, there was no significant side effect noticed     Physical Exam     Head:   microcephaly, no dysmorphic features  Neurologic Exam  Mental Status: sits in wheelchair, mute, does not  follow commands Cranial Nerves:  symmetric facial grimace.  Motor:  He has fixed contraction of bilateral elbow at 100 degree, only few upper extremity spontaneous activities, well healed bilateral forearm scar, fixed cupping of bilateral hand, fingers.  Bilateral lower extrmity has moderate spasticity on passive movement, fixed contraction of bilateral knee joints, no spontaneous movement of bilateral lower extremity. Coordination:  cannot test Gait and Station:  wheelchair-bound    Assessement and Plan:  22 yo male with cerebral palsy, severe mental retardation, EMG guided BOTOX A injection for bilateral upper extremity spasticity, he has Baclofen pump, which has helped his lower extremity spasticity.  300 units were used, 100 units were dissovled into 2 cc of NS. (lot [id number]  Exp may 2017)  Right biceps 25 units Right brachialis 50 units Right pronator teres 50 units   Left biceps 50  units, Left brachialis 75 units Left pronator teres 50 units    He tolerate the injection very well, will return to clinic in 3 months, for repeat injection.

## 2013-01-19 ENCOUNTER — Other Ambulatory Visit: Payer: Self-pay | Admitting: Family

## 2013-01-19 ENCOUNTER — Telehealth: Payer: Self-pay | Admitting: Family

## 2013-01-19 NOTE — Telephone Encounter (Addendum)
Mom [name] called and left a message asking if Dr [name] and Dr. Rikki Spearing had talked about the rescue treatment for seizures and use of portacath. She is upset that the portacath is in place and she cannot use it. She has tried everywhere looking for certification to be able to access it, but nothing like that is available. She feels that [name] is "like a time bomb" in terms of seizures, since giving him Diastat in a timely manner would be impossible most of the time. Mom asked to be called back at [phone]. She would like to talk with Dr [name] if possible. TG

## 2013-01-19 NOTE — Telephone Encounter (Signed)
I spoke with Jeffery [name].  The patient has seen Dr. Rikki Spearing twice.  He still in the process of looking at records.  I called the office and the office is now closed.  I will try again on Monday.  The patient will see Dr. Danise Mina on Tuesday.  We need to figure out how we couldn't access the port in an emergency if the patient had persistent seizures.  It's not possible for one person to get him out of his chair to get him rectal Diastat.  The patient's mother has not been allowed to enter the port because she is not certified even though she has experience with this when she worked as a Radiographer, therapeutic.  She is worried that he will have an episode of status epilepticus and she will be unable to treat him.

## 2013-01-23 ENCOUNTER — Encounter: Payer: Self-pay | Admitting: Critical Care Medicine

## 2013-01-23 ENCOUNTER — Ambulatory Visit (INDEPENDENT_AMBULATORY_CARE_PROVIDER_SITE_OTHER): Payer: Medicaid Other | Admitting: Critical Care Medicine

## 2013-01-23 VITALS — BP 104/62 | HR 97 | Temp 98.4°F

## 2013-01-23 DIAGNOSIS — J9610 Chronic respiratory failure, unspecified whether with hypoxia or hypercapnia: Secondary | ICD-10-CM

## 2013-01-23 DIAGNOSIS — J690 Pneumonitis due to inhalation of food and vomit: Secondary | ICD-10-CM

## 2013-01-23 NOTE — Telephone Encounter (Signed)
[name] called @ [phone] and left a message saying that Jeffery [name] saw Jeffery [name] this evening. She said that he was "no help" about the portacath. He doesn't mind her using it but he was no help in [place] of how to get the process going or getting her approved to use it. He is going to call you to discuss. Mom said that there is no need to call her back - she just wanted to update you on the appointment today. TG

## 2013-01-23 NOTE — Progress Notes (Signed)
Subjective:    Patient ID: Jeffery [name], male    DOB: 23-Feb-1990, 22 y.o.   MRN: [id number]  HPI  22 y.o.M  01/23/2013 Chief Complaint  Patient presents with  . Follow-up    Stable per mother.  Was having prod cough with yellow mucous, has subsided since being on levaquin X5days.  still c/o occasional nonprod cough.   Pt on two different ABX.  Pt had clear to pale yellow.  Pt saw Neuro this past week, coughed up thick yellow and 12/18 750/d x 7days. Has two left.  Uses bipap at night only.  Sim card download to be done.  Porta cath.   If seizes ?>14min needs ability to have access to the port. Ativan use.     Past Medical History  Diagnosis Date  . CP (cerebral palsy), spastic, quadriplegic   . Epilepsy   . Osteoporosis   . Inguinal hernia   . Undescended testes   . Seasonal allergies   . IVH (intraventricular hemorrhage)     Grade IV  . Hip dislocation, bilateral   . Seizures   . Hx: UTI (urinary tract infection)   . Dysphagia   . Otitis media   . Retinopathy of prematurity   . 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 Neuromuscular scoliosis   . Osteoporosis   . Complex partial seizures   . Generalized convulsive epilepsy without mention of intractable epilepsy   . Epilepsy   . Sinus bradycardia     HR drops to 38-40 while sleeping  . Sleep apnea     BiPAP  . Blister of right heel     fluid filled; origin unknown  . Kidney stones     ?  Marland Kitchen Pneumonia      chronic pneumonia ,respitory failure dx Augest 2014     Family History  Problem Relation Age of Onset  . Adopted: Yes     History   Social History  . Marital Status: Single    Spouse Name: N/A    Number of Children: 0  . Years of Education: N/A   Occupational History  . Student    .     Social History Main Topics  . Smoking status: Never Smoker   . Smokeless tobacco: Never Used  . Alcohol Use: No  . Drug Use: No  . Sexual Activity: No   Other Topics Concern  . Not on file   Social History  Narrative   Pt lives at home with his legal guardians Merlene Pulling)           Allergies  Allergen Reactions  . Adhesive [Tape]     Rips skin off  . Depakote [Divalproex Sodium]     Causes pancreatitis      Outpatient Prescriptions Prior to Visit  Medication Sig Dispense Refill  . acetaminophen (TYLENOL) 160 MG/5ML solution Place 10.2 mLs (325 mg total) into feeding tube every 4 (four) hours as needed for fever.  120 mL  0  . albuterol (PROVENTIL HFA;VENTOLIN HFA) 108 (90 BASE) MCG/ACT inhaler Inhale 2 puffs into the lungs every 4 (four) hours as needed for shortness of breath.      Marland Kitchen albuterol (PROVENTIL) (2.5 MG/3ML) 0.083% nebulizer solution Take 2.5 mg by nebulization. 1 ampule twice daily and as needed (making 4 doses daily)      . Ascorbic Acid (VITAMIN C) 500 MG/5ML LIQD 500 mg by PEG Tube route at bedtime.       Marland Kitchen  baclofen (GABLOFEN) 40000 MCG/20ML SOLN 326 mcg by Intrathecal route continuous.       . calcium carbonate, dosed in mg elemental calcium, 1250 MG/5ML 1,000 mg of elemental calcium by PEG Tube route 2 (two) times daily. 4 cc via peg tube - ON HOLD UNTIL 11/17/12 PER DR. [name]      . Diazepam (DIASTAT RE) Place rectally as needed.      . dicyclomine (BENTYL) 10 MG/5ML syrup Take by mouth every 8 (eight) hours as needed. Not to exceed 5 doses in 1 wk      . ENSURE PLUS (ENSURE PLUS) LIQD Take 237 mLs by mouth 4 (four) times daily.       . feeding supplement (PRO-STAT SUGAR FREE 64) LIQD Place 30 mLs into feeding tube 2 (two) times daily.  900 mL  0  . folic acid (FOLVITE) 1 MG tablet Take 1 mg by mouth daily.      . furosemide (LASIX) 10 MG/ML solution Place 20 mg into feeding tube daily as needed.       . gabapentin (NEURONTIN) 250 MG/5ML solution TAKE 16 ML BY MOUTH 3 TIMES A DAY  1892 mL  5  . lansoprazole (PREVACID SOLUTAB) 30 MG disintegrating tablet Take 30 mg by mouth daily.       Marland Kitchen levETIRAcetam (KEPPRA) 100 MG/ML solution Give 17.5 mls by PEG tube twice per day.   1200 mL  5  . Multiple Vitamin (MULTIVITAMIN) LIQD 10 mLs by PEG Tube route daily.      . Multiple Vitamins-Minerals (ZINC PO) 5 mLs by PEG Tube route daily. 244 mg / 5 mL zinc solution      . mupirocin ointment (BACTROBAN) 2 % Apply 1 application topically as needed (to G-T site).  22 g  1  . nystatin cream (MYCOSTATIN) Apply topically. 3-4 times daily      . OnabotulinumtoxinA (BOTOX IM) Inject 1 mL into the muscle every 3 (three) months. Last taken on 10-11-12      . OVER THE COUNTER MEDICATION Apply 1 application topically as needed (after diaper changes). Balmex and Bag Balm paste made and applied after diaper changes      . Pseudoephedrine HCl (SUDAFED CHILDRENS) 15 MG/5ML LIQD Give 15 mLs by tube every 6 (six) hours.      . Water For Irrigation, Sterile (FREE WATER) SOLN Place 100-200 mLs into feeding tube 5 (five) times daily.       Facility-Administered Medications Prior to Visit  Medication Dose Route Frequency Provider Last Rate Last Dose  . botulinum toxin Type A (BOTOX) injection 300 Units  300 Units Intramuscular Once Levert Feinstein, MD          Review of Systems  Review of systems taken in detail and is positive as above otherwise negative    Objective:   Physical Exam  BP 104/62  Pulse 97  Temp(Src) 98.4 F (36.9 C) (Oral)  SpO2 94%  Gen: Severe cerebral palsy male sitting in motorized wheelchair with head rest and facial strap.   ENT: No lesions,  mouth clear,  oropharynx clear, no postnasal drip  Neck: No JVD, no TMG, no carotid bruits  Lungs: No use of accessory muscles, no dullness to percussion, no wheezing noted. Clear  Cardiovascular: Resting tachycardia, heart sounds normal, no murmur or gallops, no peripheral edema  Abdomen: soft and NT, no HSM,  BS normal  G-tube in place  Musculoskeletal: Contraction deformities and atrophy both lower extremities and upper extremitites  Neuro: alert, non focal, the patient is nonverbal but is interactive with facial  expressions  Skin: Warm, no lesions or rashes    No results found.      Assessment & Plan:   Aspiration pneumonia Aspiration pneumonia resolved Plan Finish current levaquin  Cont bipap   Chronic respiratory failure Cont bipap qhs and prn  Download SIM card     Updated Medication List Outpatient Encounter Prescriptions as of 01/23/2013  Medication Sig  . acetaminophen (TYLENOL) 160 MG/5ML solution Place 10.2 mLs (325 mg total) into feeding tube every 4 (four) hours as needed for fever.  Marland Kitchen albuterol (PROVENTIL HFA;VENTOLIN HFA) 108 (90 BASE) MCG/ACT inhaler Inhale 2 puffs into the lungs every 4 (four) hours as needed for shortness of breath.  Marland Kitchen albuterol (PROVENTIL) (2.5 MG/3ML) 0.083% nebulizer solution Take 2.5 mg by nebulization. 1 ampule twice daily and as needed (making 4 doses daily)  . Ascorbic Acid (VITAMIN C) 500 MG/5ML LIQD 500 mg by PEG Tube route at bedtime.   . baclofen (GABLOFEN) 40000 MCG/20ML SOLN 326 mcg by Intrathecal route continuous.   . calcium carbonate, dosed in mg elemental calcium, 1250 MG/5ML 1,000 mg of elemental calcium by PEG Tube route 2 (two) times daily. 4 cc via peg tube - ON HOLD UNTIL 11/17/12 PER DR. [name]  . Diazepam (DIASTAT RE) Place rectally as needed.  . dicyclomine (BENTYL) 10 MG/5ML syrup Take by mouth every 8 (eight) hours as needed. Not to exceed 5 doses in 1 wk  . diphenoxylate-atropine (LOMOTIL) 2.5-0.025 MG/5ML liquid Take by mouth 4 (four) times daily as needed for diarrhea or loose stools. 1-2 tsp via feeding tube q6h prn loose stools  . ENSURE PLUS (ENSURE PLUS) LIQD Take 237 mLs by mouth 4 (four) times daily.   . feeding supplement (PRO-STAT SUGAR FREE 64) LIQD Place 30 mLs into feeding tube 2 (two) times daily.  . folic acid (FOLVITE) 1 MG tablet Take 1 mg by mouth daily.  . furosemide (LASIX) 10 MG/ML solution Place 20 mg into feeding tube daily as needed.   . gabapentin (NEURONTIN) 250 MG/5ML solution TAKE 16 ML BY MOUTH 3  TIMES A DAY  . lansoprazole (PREVACID SOLUTAB) 30 MG disintegrating tablet Take 30 mg by mouth daily.   Marland Kitchen levETIRAcetam (KEPPRA) 100 MG/ML solution Give 17.5 mls by PEG tube twice per day.  . levofloxacin (LEVAQUIN) 750 MG tablet Take 750 mg by mouth daily.  . Multiple Vitamin (MULTIVITAMIN) LIQD 10 mLs by PEG Tube route daily.  . Multiple Vitamins-Minerals (ZINC PO) 5 mLs by PEG Tube route daily. 244 mg / 5 mL zinc solution  . mupirocin ointment (BACTROBAN) 2 % Apply 1 application topically as needed (to G-T site).  . nystatin cream (MYCOSTATIN) Apply topically. 3-4 times daily  . OnabotulinumtoxinA (BOTOX IM) Inject 1 mL into the muscle every 3 (three) months. Last taken on 10-11-12  . OVER THE COUNTER MEDICATION Apply 1 application topically as needed (after diaper changes). Balmex and Bag Balm paste made and applied after diaper changes  . Pseudoephedrine HCl (SUDAFED CHILDRENS) 15 MG/5ML LIQD Give 15 mLs by tube every 6 (six) hours.  . Water For Irrigation, Sterile (FREE WATER) SOLN Place 100-200 mLs into feeding tube 5 (five) times daily.

## 2013-01-23 NOTE — Patient Instructions (Signed)
Finish levaquin Ok to get sim card download I will speak to [place] about the portacath access issue Return 2 months

## 2013-01-24 NOTE — Assessment & Plan Note (Signed)
Aspiration pneumonia resolved Plan Finish current levaquin  Cont bipap

## 2013-01-24 NOTE — Assessment & Plan Note (Signed)
Cont bipap qhs and prn  Download SIM card

## 2013-01-29 ENCOUNTER — Telehealth: Payer: Self-pay | Admitting: Critical Care Medicine

## 2013-01-29 NOTE — Telephone Encounter (Signed)
Just fyi.

## 2013-01-29 NOTE — Telephone Encounter (Signed)
Noted  pw

## 2013-01-31 NOTE — Telephone Encounter (Signed)
[name] called at 1138 today. She said that she wanted to let Dr [name] know that Jeffery [name]'s last seizure was on 12/22/12 and was 4 minutes long. He had one this morning that was 1  min long. His sats dropped to 50's first. She put 02 on at 100%. She said that the seizure started as petit mal that turned into a grand mal. She couldn't do anything for him, such as give Diastat - because diarrhea is so bad. She also wanted you to know that she has fired Dr Delford Field, Dr Reyes Ivan and Dr Randa Evens.  He is going back to Dr Reche Dixon and has an appointment on January 8th.  I left a message for Mom and asked her to call me back. TG

## 2013-02-02 NOTE — Telephone Encounter (Signed)
Mom has not called back. TG °

## 2013-02-05 NOTE — Telephone Encounter (Signed)
I spoke with mother.  She is going to have classes and become certified through [facility].  These diary is somewhat better.  In general he is more stable.

## 2013-02-07 ENCOUNTER — Telehealth: Payer: Self-pay | Admitting: Critical Care Medicine

## 2013-02-07 NOTE — Telephone Encounter (Signed)
Called, spoke with pt's mom.  She is calling to ask for forgiveness.  Says they transferred [facility] to Dr. Gerarda Fraction per Dr. Ronnell [place] request.  Jeffery [name] states she couldn't work with Dr. Gerarda Fraction and didn't see eye to eye with him.  Jeffery [name] has an appt with Dr. Jobe Igo to transfer PCP care back to him.  She feels as if the atmosphere and conversations during [place] with PW changed after PW spoke with Dr. Gerarda Fraction and had the feeling it was Dr. Gerarda Fraction running the show more than PW when coming to [name]'s pulmonary care.  Prior to this, Jeffery [name] states the appts with PW were always great.  On 01/29/13, stated she called stating Jeffery [name] would not be back to see PW.  Jeffery [name] states this was a mistake.  Reports she was mad, had "too much" information given during this time with discussion of trach and vent being one, and made a mistake from this.  She would like PW to continue to see Jeffery [name] stating "this is what is best for [facility]."  Pt still has a pending OV with PW in Feb in [place] -- appt was never cancelled.  Dr. Joya Gaskins, pls advise.  Thank you.

## 2013-02-07 NOTE — Telephone Encounter (Signed)
i am not sure i can accept this .....Marland Kitchenmajor issues here

## 2013-02-09 ENCOUNTER — Telehealth: Payer: Self-pay | Admitting: Family

## 2013-02-09 NOTE — Telephone Encounter (Signed)
I called and talked to Mom. She just wanted to let you know, and wanted to know if you want her continue to pursue it. She plans to try to contact the person at the [facility] again on Monday. I told Mom that I would relay her message to Dr Gaynell Face but that he was out of the office at this time. She said that there was no hurry, just to relay the message. TG

## 2013-02-09 NOTE — Telephone Encounter (Signed)
[name] left a message saying that [name] saw Dr Jobe Igo @ [facility] on 02/08/13. She said that they will not allow her to access the port even with the class with the nurse at [facility].  He said that if you want to be in charge of the port that is up to you.  He said that [facility] would not allow it because of risk of sepsis, etc. She said that DSS might get involved and that she could lose her license. Mom doesn't understand that because other people would be accessing the port too - how would they prove that her access gave him sepsis? Mom has left messages with the nurse at [facility] about the class but hasn't heard back from her. Mom's number is [phone]. I attempted to call Mom back but haven't been able to reach her. TG

## 2013-02-12 NOTE — Telephone Encounter (Signed)
[name] called. She wanted to let you know that she is going to take the port-a-cath class Monday at 11AM. TG

## 2013-02-12 NOTE — Telephone Encounter (Signed)
Noted  

## 2013-02-14 ENCOUNTER — Telehealth: Payer: Self-pay | Admitting: Pediatrics

## 2013-02-14 NOTE — Telephone Encounter (Signed)
I spoke with PW regarding this yesterday.   On 01/29/13, mom called with the following msg "Says pt will not be returning to see dr Joya Gaskins re: dr Joya Gaskins was very mean and hateful to them."   PW doesn't feel it is appropriate to accept the request and recs they schedule pt with [facility], [facility], or another local pulmonary office for pulmonary care. I called, spoke with pt's mom.  Explained PW would not be able to accept this given she had called in Dec with the above statement.  Advised to call [place], [place], [facility], or another local pulmonary office for appt to establish pt for pulmonary care.  She verbalized understanding.

## 2013-02-14 NOTE — Telephone Encounter (Signed)
I placed called to Jeffery [name] at [phone] she is representative from [place].  I also spoke with mother.  He stands one talking with them.  She has been operating under her license he stated [place] cord has been told that she is operating within the bounds of her license to perform this procedure given that she's had recent reeducation.  I told her that I would add back with her after I spoken with Ms. Megan Salon.

## 2013-03-02 ENCOUNTER — Telehealth: Payer: Self-pay | Admitting: Family

## 2013-03-02 NOTE — Telephone Encounter (Signed)
I called Mom back. She clarified information. She said Jeffery [name]'s heart rate was in 150's at ER but thought to be due to dehydration. He had EKG at ER that showed sinus tach - as it has in the past. It came down after being given fluids to about 110-120, but it has stayed up around 126-130 since and he has been restless, anxious, flailing in his chair at times. His respirations are normal today, O2 sats ok - he is not having air hunger. It seems to be anxiety to [place]. She tried giving him Tylenol, thinking he was having body aches etc with pneumonia - that did not help. [name] wants to talk with you about prescribing a medication to help him be calmer and get some rest. Her number is [phone]. TG

## 2013-03-02 NOTE — Telephone Encounter (Signed)
Jeffery [name] is fragile, and we should not give him medication to take care of anxiety that may be actually a physiologic problem with his pneumonia.  We don't want dropped his respiratory drive which could get him into much more trouble.  I explained this to mother, I think that she understands the reasoning. I told her that if he starts exhibiting increasing heart rate, agitation, sweating, fever, that he may need to come into the hospital for parenteral antibiotics and fluids.

## 2013-03-02 NOTE — Telephone Encounter (Addendum)
[name] called She said that Jeffery [name] was taken to [facility] ER yesterday. She said that she was told that he had aspiration pneumonia because he had sats in 28's. She said that she  called ambulance because of his condition and he was taken to [facility] because it was closer. He was treated and released. He had tachycardia in ER. He is still having tachycardia and is very anxious. He can't sit still and is very restless. She called his PCP but Dr Jobe Igo is off today and the NP is are booked plus she has never seen him. They told her to call Dr Gaynell Face. Mom wants to talk to Dr Gaynell Face Her number is [phone]. I called [name] and left a message. I will call her back soon.TG

## 2013-03-05 ENCOUNTER — Telehealth: Payer: Self-pay | Admitting: Family

## 2013-03-05 NOTE — Telephone Encounter (Signed)
Jeffery [name] called to thank you for calling her Friday evening and to give you and update on the weekend. She said that [place] had good weekend. He had heart rates in the 90's on Saturday and was on 1L oxygen on the weekend. Today he is on room air. She contacted GI doc about putting in j-tube without putting him to sleep and is waiting to hear back about that. She just wanted to thank you for talking to her Friday and to let you know that he was doing better. TG

## 2013-03-05 NOTE — Telephone Encounter (Signed)
Noted, thanks!

## 2013-03-09 ENCOUNTER — Telehealth: Payer: Self-pay | Admitting: Family

## 2013-03-09 NOTE — Telephone Encounter (Signed)
Mom [name] called to let Dr Gaynell Face know that she took Jeffery [name] to [facility] ER on Weds and was insistent on GI consult because she could hear him aspirating. He was admitted and ultimately now he has a tube with 2 ports one for feeds and one for meds. She was very emotional and said that she had a very good experience with a hospitalist there that was helpful and attentive to her, and that she wanted to thank you for always listening to her. She feels now that she has done everything that she could do for [place], as you and she have talked about. She expects that he will be discharged from [facility] today. She said that there is no need to call back, she just wanted to let you know that he does have the J-G tube now. TG

## 2013-03-09 NOTE — Telephone Encounter (Signed)
noted 

## 2013-03-20 ENCOUNTER — Ambulatory Visit: Payer: Medicaid Other | Admitting: Critical Care Medicine

## 2013-03-27 ENCOUNTER — Encounter: Payer: Medicaid Other | Admitting: Pediatrics

## 2013-03-27 ENCOUNTER — Ambulatory Visit (INDEPENDENT_AMBULATORY_CARE_PROVIDER_SITE_OTHER): Payer: Medicaid Other | Admitting: Pediatrics

## 2013-03-27 DIAGNOSIS — G808 Other cerebral palsy: Secondary | ICD-10-CM

## 2013-03-27 DIAGNOSIS — G40309 Generalized idiopathic epilepsy and epileptic syndromes, not intractable, without status epilepticus: Secondary | ICD-10-CM

## 2013-03-27 DIAGNOSIS — G40219 Localization-related (focal) (partial) symptomatic epilepsy and epileptic syndromes with complex partial seizures, intractable, without status epilepticus: Secondary | ICD-10-CM
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
## 2013-04-06 ENCOUNTER — Telehealth: Payer: Self-pay | Admitting: *Deleted

## 2013-04-06 NOTE — Telephone Encounter (Signed)
I spoke with mother for about 15 minutes.
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
## 2013-04-11 ENCOUNTER — Encounter: Payer: Self-pay | Admitting: Neurology

## 2013-04-11 ENCOUNTER — Ambulatory Visit (INDEPENDENT_AMBULATORY_CARE_PROVIDER_SITE_OTHER): Payer: Medicaid Other | Admitting: Neurology

## 2013-04-11 ENCOUNTER — Telehealth: Payer: Self-pay | Admitting: Family

## 2013-04-11 VITALS — BP 131/93 | HR 99

## 2013-04-11 DIAGNOSIS — G808 Other cerebral palsy: Secondary | ICD-10-CM

## 2013-04-11 DIAGNOSIS — G8250 Quadriplegia, unspecified: Secondary | ICD-10-CM

## 2013-04-11 DIAGNOSIS — G40209 Localization-related (focal) (partial) symptomatic epilepsy and epileptic syndromes with complex partial seizures, not intractable, without status epilepticus: Secondary | ICD-10-CM

## 2013-04-11 DIAGNOSIS — J9610 Chronic respiratory failure, unspecified whether with hypoxia or hypercapnia: Secondary | ICD-10-CM

## 2013-04-11 MED ORDER — ONABOTULINUMTOXINA 100 UNITS IJ SOLR
300.0000 [IU] | Freq: Once | INTRAMUSCULAR | Status: AC
  Administered 2013-04-11: 300 [IU] via INTRAMUSCULAR

## 2013-04-11 NOTE — Progress Notes (Signed)
HPI:   Jeffery [name] came in for EMG guided BOTOX injection for bilateral upper extremity spasticity, accompanied by his mother.  History: Patient is a 23 year old with spastic quadriparesis, severe cognitive delays, and complex partial seizures with secondary generalization as a result of extreme prematurity with grade 4 intraventricular hemorrhage. His mother adopt him and his twin sister since 40 months old.  He is wheelchair bound, depend on PEG tube feeding, he can communicate some with facial expression, but non verbal. He has severe nonfunctional spastic qudraplegia, s/p baclofen bump placement at [facility]. Dr Gaynell Face refills his Baclofen bump, which helps his lower extremity spasticity, it is much easier for care giver to change his diaper, other wise he has frequent bilateral lower extremity spasticity, clonus, and hip pain.  He has been receiving BOTOX A injection from [facility] Dr. Haynes Kerns since 2000, q3-4 months, it has been very helpful, his upper extremity is more relaxed, it is easier to put on cloth and clean his hands after injection, last injection from faxed over note was in 09/26/2009, 300 units were injected into bilateral biceps, brachioradialis, pronator teres, ECU, FDP, mother wants to change care locally for convience.  Mother reported benefit usually lasts less than 3 months, she would then noticed increased difficulty to clean his hands, he also had some improvement with his grip with BOTOX A,   He also has bilateral forearm deep tendon release surgery for his bilateral hands finger flexions in the summer of 2014  UPDATE 01/18/2013: Last EMG guided Botox injection was in Sep 2014, he received 300 units of Botox a divided into right and left upper extremities. he did very well, there was no significant side effect noticed     Physical Exam     Head:   microcephaly, no dysmorphic features  Neurologic Exam  Mental Status: sits in wheelchair, mute, does not  follow commands Cranial Nerves:  symmetric facial grimace.  Motor:  He has fixed contraction of bilateral elbow at 100 degree, only few upper extremity spontaneous activities, well healed bilateral forearm scar, fixed cupping of bilateral hand, fingers.  Bilateral lower extrmity has moderate spasticity on passive movement, fixed contraction of bilateral knee joints, no spontaneous movement of bilateral lower extremity. Coordination:  cannot test Gait and Station:  wheelchair-bound    Assessement and Plan:  23 yo male with cerebral palsy, severe mental retardation, EMG guided BOTOX A injection for bilateral upper extremity spasticity, he has Baclofen pump, which has helped his lower extremity spasticity.  300 units were used, 100 units were dissovled into 2 cc of NS. (lot [id number]  Exp April 2017)  Right biceps 50 units Right brachialis 75 units Right pronator teres 25units    Left biceps 50  units, Left brachialis 75 units Left pronator teres 25 units    He tolerate the injection very well, will return to clinic in 3 months, for repeat injection.

## 2013-04-11 NOTE — Telephone Encounter (Signed)
I researched the chart for 10 minutes and spoke with mother for 12 minutes.  I looked back to 2006.  He had pancreatitis on Depakote ER.  He was on Topamax in 2006.  At some point he switched to [place].  He is on Neurontin for pain control.  I'm unaware of any other antiepileptic medications that have been used.  I would not be surprised to find that he been on Tegretol, Dilantin, or phenobarbital when he was younger.  I asked his mother to check with his former Child neurologist, Dr. Chrissie Noa.  Options that we have available include Lamictal, Trileptal, Vimpat among others.  We also talked about IV Ativan which in this case would have likely sent this child to the hospital.  I told her that we are working on nasal Versed.

## 2013-04-11 NOTE — Telephone Encounter (Signed)
[name] called to report that Jeffery [name] had 2 seizures this morning, about 10-15 min apart. The seizures were with eyes deviating to the right and "bouncing", and constant chewing motions of his mouth. The first Mom witnessed 1 min, the second lasted 50 seconds. She was not in the room when the first one started - she went in when the sat alarm went off so she is not sure how long it was occuring before she went in. Jeffery [name] dropped his sats to 60's and was ashen. He was apneic each time and Mom stimulated him gently. Mom put 100% O2 on and he responded. Mom said that he usually does not have 2 in close proximity. Has only done that once before when he was in the hospital. He has been otherwise well, has not missed doses. Mom's number is [phone].

## 2013-04-12 ENCOUNTER — Telehealth: Payer: Self-pay | Admitting: Family

## 2013-04-12 NOTE — Telephone Encounter (Signed)
[name] called to report that Jeffery [name] had another seizure this morning like he did yesterday morning. He had eye deviation and chewing motions of his mouth. He became ashen and his 02 sats dropped. Mom gave him 100% 02 and he recovered quickly once the 02 was placed on him. The seizure lasted 40 seconds. Mom's question is about him returning to the [place] day program. She said that Dr Jobe Igo told her that as far as he was concerned that Jeffery [name] could return, but with him having these seizures, Mom wonders if he should wait until the intrasnasal Versed has been implemented and tried as the day program can't give him Diastat for a seizure any more than she can. Mom asked to be called back tomorrow about this.  Her number is [phone]. TG

## 2013-04-13 NOTE — Telephone Encounter (Signed)
He should not returned today care until his seizures settle down.  I think that it will be very difficult to manage him there.  I returned a call to mother and she agrees

## 2013-04-14 ENCOUNTER — Inpatient Hospital Stay (HOSPITAL_COMMUNITY)
Admission: AD | Admit: 2013-04-14 | Discharge: 2013-04-17 | DRG: 177 | Disposition: A | Discharge status: Home / Self Care | Payer: Medicaid Other | Source: Other Acute Inpatient Hospital | Attending: Pulmonary Disease | Admitting: Pulmonary Disease

## 2013-04-14 ENCOUNTER — Encounter (HOSPITAL_COMMUNITY): Payer: Self-pay | Admitting: *Deleted

## 2013-04-14 DIAGNOSIS — G8929 Other chronic pain: Secondary | ICD-10-CM | POA: Diagnosis present

## 2013-04-14 DIAGNOSIS — M4140 Neuromuscular scoliosis, site unspecified: Secondary | ICD-10-CM

## 2013-04-14 DIAGNOSIS — G40209 Localization-related (focal) (partial) symptomatic epilepsy and epileptic syndromes with complex partial seizures, not intractable, without status epilepticus: Secondary | ICD-10-CM

## 2013-04-14 DIAGNOSIS — Q539 Undescended testicle, unspecified: Secondary | ICD-10-CM

## 2013-04-14 DIAGNOSIS — E876 Hypokalemia: Secondary | ICD-10-CM | POA: Diagnosis not present

## 2013-04-14 DIAGNOSIS — J9600 Acute respiratory failure, unspecified whether with hypoxia or hypercapnia: Secondary | ICD-10-CM | POA: Diagnosis present

## 2013-04-14 DIAGNOSIS — H5089 Other specified strabismus: Secondary | ICD-10-CM | POA: Diagnosis not present

## 2013-04-14 DIAGNOSIS — G4733 Obstructive sleep apnea (adult) (pediatric): Secondary | ICD-10-CM | POA: Diagnosis not present

## 2013-04-14 DIAGNOSIS — M810 Age-related osteoporosis without current pathological fracture: Secondary | ICD-10-CM | POA: Diagnosis not present

## 2013-04-14 DIAGNOSIS — J9610 Chronic respiratory failure, unspecified whether with hypoxia or hypercapnia: Secondary | ICD-10-CM

## 2013-04-14 DIAGNOSIS — R6510 Systemic inflammatory response syndrome (SIRS) of non-infectious origin without acute organ dysfunction: Secondary | ICD-10-CM

## 2013-04-14 DIAGNOSIS — I498 Other specified cardiac arrhythmias: Secondary | ICD-10-CM | POA: Diagnosis present

## 2013-04-14 DIAGNOSIS — Z931 Gastrostomy status: Secondary | ICD-10-CM

## 2013-04-14 DIAGNOSIS — R1310 Dysphagia, unspecified: Secondary | ICD-10-CM

## 2013-04-14 DIAGNOSIS — D696 Thrombocytopenia, unspecified: Secondary | ICD-10-CM | POA: Diagnosis present

## 2013-04-14 DIAGNOSIS — S90829A Blister (nonthermal), unspecified foot, initial encounter: Secondary | ICD-10-CM

## 2013-04-14 DIAGNOSIS — G808 Other cerebral palsy: Secondary | ICD-10-CM | POA: Diagnosis not present

## 2013-04-14 DIAGNOSIS — H35109 Retinopathy of prematurity, unspecified, unspecified eye: Secondary | ICD-10-CM

## 2013-04-14 DIAGNOSIS — M4120 Other idiopathic scoliosis, site unspecified: Secondary | ICD-10-CM | POA: Diagnosis present

## 2013-04-14 DIAGNOSIS — G9340 Encephalopathy, unspecified: Secondary | ICD-10-CM | POA: Diagnosis present

## 2013-04-14 DIAGNOSIS — R197 Diarrhea, unspecified: Secondary | ICD-10-CM

## 2013-04-14 DIAGNOSIS — G40219 Localization-related (focal) (partial) symptomatic epilepsy and epileptic syndromes with complex partial seizures, intractable, without status epilepticus: Secondary | ICD-10-CM | POA: Diagnosis present

## 2013-04-14 DIAGNOSIS — R0902 Hypoxemia: Secondary | ICD-10-CM | POA: Diagnosis present

## 2013-04-14 DIAGNOSIS — Z8679 Personal history of other diseases of the circulatory system: Secondary | ICD-10-CM | POA: Diagnosis present

## 2013-04-14 DIAGNOSIS — J690 Pneumonitis due to inhalation of food and vomit: Secondary | ICD-10-CM

## 2013-04-14 DIAGNOSIS — Z87898 Personal history of other specified conditions: Secondary | ICD-10-CM

## 2013-04-14 DIAGNOSIS — G40319 Generalized idiopathic epilepsy and epileptic syndromes, intractable, without status epilepticus: Secondary | ICD-10-CM

## 2013-04-14 DIAGNOSIS — Z79899 Other long term (current) drug therapy: Secondary | ICD-10-CM

## 2013-04-14 DIAGNOSIS — J9690 Respiratory failure, unspecified, unspecified whether with hypoxia or hypercapnia: Secondary | ICD-10-CM

## 2013-04-14 DIAGNOSIS — Z9989 Dependence on other enabling machines and devices: Secondary | ICD-10-CM

## 2013-04-14 DIAGNOSIS — G40309 Generalized idiopathic epilepsy and epileptic syndromes, not intractable, without status epilepticus: Secondary | ICD-10-CM

## 2013-04-14 DIAGNOSIS — G7089 Other specified myoneural disorders: Secondary | ICD-10-CM | POA: Diagnosis present

## 2013-04-14 LAB — MRSA PCR SCREENING: MRSA BY PCR: NEGATIVE

## 2013-04-15 ENCOUNTER — Inpatient Hospital Stay (HOSPITAL_COMMUNITY): Payer: Medicaid Other

## 2013-04-15 DIAGNOSIS — G4733 Obstructive sleep apnea (adult) (pediatric): Secondary | ICD-10-CM

## 2013-04-15 DIAGNOSIS — J9600 Acute respiratory failure, unspecified whether with hypoxia or hypercapnia: Secondary | ICD-10-CM

## 2013-04-15 DIAGNOSIS — R1310 Dysphagia, unspecified: Secondary | ICD-10-CM

## 2013-04-15 DIAGNOSIS — J9690 Respiratory failure, unspecified, unspecified whether with hypoxia or hypercapnia: Secondary | ICD-10-CM | POA: Diagnosis present

## 2013-04-15 DIAGNOSIS — G40209 Localization-related (focal) (partial) symptomatic epilepsy and epileptic syndromes with complex partial seizures, not intractable, without status epilepticus: Secondary | ICD-10-CM

## 2013-04-15 DIAGNOSIS — J9610 Chronic respiratory failure, unspecified whether with hypoxia or hypercapnia: Secondary | ICD-10-CM

## 2013-04-15 DIAGNOSIS — R6510 Systemic inflammatory response syndrome (SIRS) of non-infectious origin without acute organ dysfunction: Secondary | ICD-10-CM

## 2013-04-15 DIAGNOSIS — G808 Other cerebral palsy: Secondary | ICD-10-CM

## 2013-04-15 DIAGNOSIS — J690 Pneumonitis due to inhalation of food and vomit: Principal | ICD-10-CM

## 2013-04-15 LAB — GLUCOSE, CAPILLARY
GLUCOSE-CAPILLARY: 129 mg/dL — AB (ref 70–99)
GLUCOSE-CAPILLARY: 123 mg/dL — AB (ref 70–99)
Glucose-Capillary: 101 mg/dL — ABNORMAL HIGH (ref 70–99)
Glucose-Capillary: 88 mg/dL (ref 70–99)

## 2013-04-15 LAB — BASIC METABOLIC PANEL
BUN: 18 mg/dL (ref 6–23)
CHLORIDE: 102 meq/L (ref 96–112)
CO2: 27 meq/L (ref 19–32)
Calcium: 9 mg/dL (ref 8.4–10.5)
Creatinine, Ser: 0.54 mg/dL (ref 0.50–1.35)
GFR calc Af Amer: >90 mL/min (ref >90)
GFR calc non Af Amer: >90 mL/min (ref >90)
GLUCOSE: 132 mg/dL — AB (ref 70–99)
POTASSIUM: 3.8 meq/L (ref 3.7–5.3)
SODIUM: 144 meq/L (ref 137–147)

## 2013-04-15 LAB — CBC
HCT: 43.6 % (ref 39.0–52.0)
HEMOGLOBIN: 14.7 g/dL (ref 13.0–17.0)
MCH: 32.5 pg (ref 26.0–34.0)
MCHC: 33.7 g/dL (ref 30.0–36.0)
MCV: 96.2 fL (ref 78.0–100.0)
Platelets: 101 10*3/uL — ABNORMAL LOW (ref 150–400)
RBC: 4.53 MIL/uL (ref 4.22–5.81)
RDW: 14.2 % (ref 11.5–15.5)
WBC: 6.8 10*3/uL (ref 4.0–10.5)

## 2013-04-15 MED ORDER — NON FORMULARY: 244.0000 mg | Freq: Every day | Status: DC

## 2013-04-15 MED ORDER — DEXTROSE-NACL 5-0.9 % IV SOLN
INTRAVENOUS | Status: DC
  Administered 2013-04-15: 03:00:00 via INTRAVENOUS

## 2013-04-15 MED ORDER — MUPIROCIN 2 % EX OINT: 1.0000 "application " | TOPICAL_OINTMENT | CUTANEOUS | Status: DC | PRN

## 2013-04-15 MED ORDER — SODIUM CHLORIDE 0.9 % IV BOLUS (SEPSIS)
1000.0000 mL | Freq: Once | INTRAVENOUS | Status: AC
  Administered 2013-04-15: 1000 mL via INTRAVENOUS

## 2013-04-15 MED ORDER — ALBUTEROL SULFATE (2.5 MG/3ML) 0.083% IN NEBU
INHALATION_SOLUTION | RESPIRATORY_TRACT | Status: AC
  Filled 2013-04-15: qty 3

## 2013-04-15 MED ORDER — FAMOTIDINE IN NACL 20-0.9 MG/50ML-% IV SOLN
20.0000 mg | Freq: Two times a day (BID) | INTRAVENOUS | Status: DC
  Filled 2013-04-15 (×2): qty 50

## 2013-04-15 MED ORDER — ADULT MULTIVITAMIN LIQUID CH
10.0000 mL | Freq: Every day | ORAL | Status: DC
  Administered 2013-04-15 – 2013-04-16 (×2): 10 mL
  Filled 2013-04-15 (×3): qty 10

## 2013-04-15 MED ORDER — DEXTROSE 10 % IV SOLN
INTRAVENOUS | Status: DC
Start: 2013-04-15 — End: 2013-04-17
  Administered 2013-04-15 – 2013-04-17 (×3): via INTRAVENOUS

## 2013-04-15 MED ORDER — VITAMIN C 500 MG/5ML PO SYRP
500.0000 mg | ORAL_SOLUTION | Freq: Every day | ORAL | Status: DC
  Administered 2013-04-15 – 2013-04-17 (×3): 500 mg via ORAL
  Filled 2013-04-15 (×3): qty 5

## 2013-04-15 MED ORDER — LEVOFLOXACIN IN D5W 750 MG/150ML IV SOLN
750.0000 mg | Freq: Once | INTRAVENOUS | Status: AC
  Administered 2013-04-15: 750 mg via INTRAVENOUS
  Filled 2013-04-15: qty 150

## 2013-04-15 MED ORDER — PANTOPRAZOLE SODIUM 40 MG PO PACK
80.0000 mg | PACK | Freq: Every day | ORAL | Status: DC
  Administered 2013-04-15: 80 mg
  Filled 2013-04-15 (×2): qty 40

## 2013-04-15 MED ORDER — GABAPENTIN 250 MG/5ML PO SOLN
800.0000 mg | Freq: Three times a day (TID) | ORAL | Status: DC
  Administered 2013-04-15 – 2013-04-17 (×8): 800 mg
  Filled 2013-04-15 (×12): qty 16

## 2013-04-15 MED ORDER — FREE WATER
150.0000 mL | Status: DC
  Administered 2013-04-15 – 2013-04-17 (×8): 150 mL

## 2013-04-15 MED ORDER — FLUTICASONE PROPIONATE 50 MCG/ACT NA SUSP
1.0000 | Freq: Every day | NASAL | Status: DC
  Administered 2013-04-16 – 2013-04-17 (×2): 1 via NASAL
  Filled 2013-04-15: qty 16

## 2013-04-15 MED ORDER — FOLIC ACID 1 MG PO TABS
1.0000 mg | ORAL_TABLET | Freq: Every day | ORAL | Status: DC
  Administered 2013-04-15 – 2013-04-17 (×3): 1 mg
  Filled 2013-04-15 (×3): qty 1

## 2013-04-15 MED ORDER — ALBUTEROL SULFATE (2.5 MG/3ML) 0.083% IN NEBU
2.5000 mg | INHALATION_SOLUTION | RESPIRATORY_TRACT | Status: DC
Start: 2013-04-15 — End: 2013-04-16
  Administered 2013-04-15 – 2013-04-16 (×9): 2.5 mg via RESPIRATORY_TRACT
  Filled 2013-04-15 (×8): qty 3

## 2013-04-15 MED ORDER — LEVETIRACETAM 100 MG/ML PO SOLN
1750.0000 mg | Freq: Two times a day (BID) | ORAL | Status: DC
  Administered 2013-04-15 – 2013-04-17 (×6): 1750 mg via ORAL
  Filled 2013-04-15 (×7): qty 20

## 2013-04-15 MED ORDER — SODIUM CHLORIDE 0.9 % IV SOLN: 250.0000 mL | INTRAVENOUS | Status: DC | PRN

## 2013-04-15 MED ORDER — POLYETHYLENE GLYCOL 3350 17 G PO PACK
17.0000 g | PACK | Freq: Every day | ORAL | Status: DC
  Administered 2013-04-15 – 2013-04-16 (×2): 17 g via JEJUNOSTOMY
  Filled 2013-04-15 (×3): qty 1

## 2013-04-15 MED ORDER — VITAMIN C 500 MG/5ML PO LIQD: 500.0000 mg | Freq: Every day | ORAL | Status: DC

## 2013-04-15 MED ORDER — GABAPENTIN 250 MG/5ML PO SOLN
800.0000 mg | Freq: Three times a day (TID) | ORAL | Status: DC
  Filled 2013-04-15: qty 16

## 2013-04-15 MED ORDER — CALCIUM CARBONATE 1250 MG/5ML PO SUSP
1000.0000 mg | Freq: Two times a day (BID) | ORAL | Status: DC
  Administered 2013-04-16 – 2013-04-17 (×3): 1000 mg
  Filled 2013-04-15 (×4): qty 10

## 2013-04-15 MED ORDER — LEVOFLOXACIN IN D5W 750 MG/150ML IV SOLN
750.0000 mg | INTRAVENOUS | Status: DC
  Administered 2013-04-15 – 2013-04-16 (×2): 750 mg via INTRAVENOUS
  Filled 2013-04-15 (×2): qty 150

## 2013-04-15 MED ORDER — ZINC SULFATE 220 (50 ZN) MG PO CAPS
220.0000 mg | ORAL_CAPSULE | Freq: Every day | ORAL | Status: DC
  Administered 2013-04-16: 220 mg
  Filled 2013-04-15: qty 1

## 2013-04-15 MED ORDER — LEVETIRACETAM 100 MG/ML PO SOLN: 1750.0000 mg | Freq: Two times a day (BID) | ORAL | Status: DC

## 2013-04-15 MED ORDER — LANSOPRAZOLE 30 MG PO TBDP: 30.0000 mg | ORAL_TABLET | Freq: Every day | ORAL | Status: DC

## 2013-04-15 MED ORDER — VITAMIN C 500 MG PO TABS: 500.0000 mg | ORAL_TABLET | Freq: Every day | ORAL | Status: DC

## 2013-04-15 MED ORDER — HEPARIN SODIUM (PORCINE) 5000 UNIT/ML IJ SOLN
5000.0000 [IU] | Freq: Three times a day (TID) | INTRAMUSCULAR | Status: DC
  Administered 2013-04-15 – 2013-04-17 (×6): 5000 [IU] via SUBCUTANEOUS
  Filled 2013-04-15 (×12): qty 1

## 2013-04-15 MED ORDER — ACETAMINOPHEN 160 MG/5ML PO SOLN
325.0000 mg | ORAL | Status: DC | PRN
  Administered 2013-04-15: 325 mg
  Filled 2013-04-15: qty 20.3

## 2013-04-15 NOTE — Progress Notes (Signed)
UR completed 

## 2013-04-15 NOTE — Progress Notes (Signed)
PULMONARY / CRITICAL CARE MEDICINE   Name: Jeffery [name] MRN: [id number] DOB: 06/07/1990    ADMISSION DATE:  04/14/2013  REFERRING MD :  [facility]  CHIEF COMPLAINT:  Tachycardia  BRIEF PATIENT DESCRIPTION:  23 yo male with hx of CP 2nd to [place] and premature birth brought to ER with tachycardia, diaphoresis, hypoxia from recurrent aspiration PNA.    SIGNIFICANT EVENTS: 3/14 Admit   STUDIES:   LINES / TUBES: Rt chest port >> G/J tube >>  CULTURES: None  ANTIBIOTICS: Levaquin 3/15 >>  SUBJECTIVE:  Mother reports he is more alert and smiling this morning.  VITAL SIGNS: Temp:  [97.9 F (36.6 C)-98.1 F (36.7 C)] 98.1 F (36.7 C) (03/15 0400) Pulse Rate:  [114-136] 124 (03/15 0748) Resp:  [11-32] 17 (03/15 0748) BP: (94-150)/(58-91) 116/68 mmHg (03/15 0700) SpO2:  [93 %-100 %] 98 % (03/15 0752) FiO2 (%):  [30 %-40 %] 30 % (03/15 0752) Weight:  [113 lb 15.7 oz (51.7 kg)-116 lb (52.617 kg)] 116 lb (52.617 kg) (03/15 0500) VENTILATOR SETTINGS: Vent Mode:  [-] BIPAP FiO2 (%):  [30 %-40 %] 30 % INTAKE / OUTPUT: Intake/Output     03/14 0701 - 03/15 0700 03/15 0701 - 03/16 0700   I.V. (mL/kg) 53.7 (1)    IV Piggyback 1000    Total Intake(mL/kg) 1053.7 (20)    Net +1053.7          Urine Occurrence 2 x      PHYSICAL EXAMINATION: General: No distress Neuro:  Smiles when speaking to him HEENT:  BiPAP mask in place Cardiovascular:  Regular, tachycardic, no murmur, Port site Rt chest clean Lungs:  B/l rhonchi Abdomen:  Soft, non tender, G/J tube in LUQ Musculoskeletal: contracted Skin:  No rashes  LABS:  CBC  Recent Labs Lab 04/15/13 0055  WBC 6.8  HGB 14.7  HCT 43.6  PLT 101*   BMET  Recent Labs Lab 04/15/13 0055  NA 144  K 3.8  CL 102  CO2 27  BUN 18  CREATININE 0.54  GLUCOSE 132*   Electrolytes  Recent Labs Lab 04/15/13 0055  CALCIUM 9.0   Glucose  Recent Labs Lab 04/15/13 0225 04/15/13 0413 04/15/13 0742  GLUCAP 129* 123*  101*    Imaging Dg Chest Port 1 View  04/15/2013   CLINICAL DATA:  Respiratory distress  EXAM: PORTABLE CHEST - 1 VIEW  COMPARISON:  04/14/2013.  FINDINGS: Harrington rods.  Port-A-Cath unchanged.  The lungs remain clear without evidence of pneumonia. Mild scarring in the left lung base.  IMPRESSION: No active disease.   Electronically Signed   By: Franchot Gallo M.D.   On: 04/15/2013 05:52   ASSESSMENT / PLAN:  PULMONARY A: Acute respiratory failure 2nd to recurrent aspiration pneumonitis. Hx of OSA >> uses BiPAP at home. P:   BiPAP qhs and prn during the day Oxygen to keep SpO2 > 92% F/u CXR 3/16 Bronchial hygiene Mother okay with short term intubation if needed >> no trach/long term vent  CARDIOVASCULAR A:  Sinus tachycardia with SIRS in setting of aspiration. P:  Monitor hemodynamics Even fluid balance  RENAL A:   No acute issues. P:   Monitor renal fx, urine outpt, electrolytes  GASTROINTESTINAL A:   Nutrition. P:   Will resume tube feeds Aspiration precautions Protonix for SUP Free water tube flushes  HEMATOLOGIC A:   No acute issues. P:  F/u CBC  INFECTIOUS A:   Aspiration pneumonitis. P:   Day 1 of  levaquin >> will have pharmacy dose adjust  ENDOCRINE A:   No acute issues.   P:   Monitor CBG's  NEUROLOGIC A:   Hx of CP, spasticity, seizures, chronic hip pain P:   Continue baclofen pump, neurotin, keppra  Goals of Care >> Mother would not want trach/long term vent support >> otherwise full code.  Updated mother at bedside.  CC time 50 minutes.  Chesley Mires, MD [facility] Pulmonary/Critical Care 04/15/2013, 9:48 AM Pager:  [phone] After 3pm call: [phone]

## 2013-04-15 NOTE — Progress Notes (Signed)
23yo male w/ sz d/o now presents w/ respiratory failure, concern for aspiration, to begin IV ABX.  Will start Levaquin 750mg  IV Q24H for CrCl >100 ml/min and monitor CBC and Cx.  Wynona Neat, PharmD, BCPS 04/15/2013 12:06 AM

## 2013-04-15 NOTE — Progress Notes (Signed)
Attempted to wean pt off BIPAP.  Pt did not tolerate inc RR >33, HR 120's.  Placed back on BIPAP.  RT to monitor

## 2013-04-15 NOTE — H&P (Signed)
Name: Jeffery [name] MRN: [id number] DOB: 1990/07/21    LOS: 1  Referring Provider:  [facility]  Reason for Referral:  Respiratory failure  PULMONARY / CRITICAL CARE MEDICINE  HPI:  Jeffery [name] is a 23 y/o man with history of severe CP secondary to grade IV intracranial hemorrhage and premature birth at 25 weeks.  He has static encephalopathy, seizure disorder and a history of recurrent aspiration pneumonias.  He was last hospitalized in early February at [facility] hospital at which time he underwent conversion of his G tube to a G-J tube to try to limit aspiration events.  His mother reports that 4 days prior to admission he had 3 seizures during which he had oxygen desaturations.  In the past his seizures have been associated with aspiration events.  The day prior to admission he became more lethargic and less interactive, he then began to produce yellow sputum.  The day of admission he became diaphoretic and tachycardic, then hypoxic.  At that time EMS was called.  His mother started levaquin the day prior to admission as he has responded well to this in the past.   Per report on EMS arrival Jeffery [name] was satting in the low 80s on a non-rebreather and had a heart rate in the 170s.  He was tachypneic and using accessory muscles to breathe.  He also had expiratory wheezes and rhonchi.  He was started on BiPap with improvement in his saturations and work of breathing.  On note Jeffery [name] is on nocturnal bipap (12/7).  Past Medical History  Diagnosis Date  . CP (cerebral palsy), spastic, quadriplegic   . Epilepsy   . Osteoporosis   . Inguinal hernia   . Undescended testes   . Seasonal allergies   . IVH (intraventricular hemorrhage)     Grade IV  . Hip dislocation, bilateral   . Seizures   . Hx: UTI (urinary tract infection)   . Dysphagia   . Otitis media   . Retinopathy of prematurity   . 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 Neuromuscular scoliosis   . Osteoporosis   . Complex partial  seizures   . Generalized convulsive epilepsy without mention of intractable epilepsy   . Epilepsy   . Sinus bradycardia     HR drops to 38-40 while sleeping  . Sleep apnea     BiPAP  . Blister of right heel     fluid filled; origin unknown  . Kidney stones     ?  Marland Kitchen Pneumonia      chronic pneumonia ,respitory failure dx Augest 2014   Past Surgical History  Procedure Laterality Date  . Excision of moles    . Peg placement      With multiple changes  . Gastrostomy tube, change / reposition  12/15/2010       . Button change  12/15/2010    Procedure: BUTTON CHANGE;  Surgeon: Gatha Mayer, MD;  Location: [facility];  Service: Endoscopy;  Laterality: N/A;  . Peg placement  10/07/2011    Procedure: PERCUTANEOUS ENDOSCOPIC GASTROSTOMY (PEG) REPLACEMENT;  Surgeon: Lafayette Dragon, MD;  Location: [facility];  Service: Endoscopy;  Laterality: N/A;  Needs 18 F 2.5 button ordered-dl  . Inguinal hernia repair Bilateral 1992  . Retinopathy of prematurity surgery  1992  . Achilles tendon lengthening  12/1998  . Soft tissue releases  wrists and fingers  12/1998  . Intrathecal baclofen pump placement  07/25/2000  . Peg placement N/A  06/05/2012    Procedure: PERCUTANEOUS ENDOSCOPIC GASTROSTOMY (PEG) REPLACEMENT;  Surgeon: Lafayette Dragon, MD;  Location: [facility];  Service: Endoscopy;  Laterality: N/A;  button 88fr.2.5cm  . Eye surgery Bilateral     Retinal   . Back surgery      Harrington Rods in back needs to be log rolled  . Tendon repair Bilateral 09/05/2012    Procedure: LENGTHENING OF DIGITAL FLEXOR TENDONS BILTERAL HANDS;  Surgeon: Jolyn Nap, MD;  Location: [place];  Service: Orthopedics;  Laterality: Bilateral;  . Hand surgery Bilateral Aug. 2014  . Peg placement N/A 09/13/2012    Procedure: PERCUTANEOUS ENDOSCOPIC GASTROSTOMY (PEG) REPLACEMENT;  Surgeon: Lafayette Dragon, MD;  Location: [facility];  Service: Endoscopy;  Laterality: N/A;  . Flexible sigmoidoscopy N/A 10/30/2012     Procedure: FLEXIBLE SIGMOIDOSCOPY;  Surgeon: Cleotis Nipper, MD;  Location: [facility];  Service: Endoscopy;  Laterality: N/A;  . Peg placement N/A 11/15/2012    Procedure: PERCUTANEOUS ENDOSCOPIC GASTROSTOMY (PEG) REPLACEMENT;  Surgeon: Jeryl Columbia, MD;  [facility];  Service: Endoscopy;  Laterality: N/A;   Prior to Admission medications   Medication Sig Start Date End Date Taking? Authorizing Provider  albuterol (PROVENTIL HFA;VENTOLIN HFA) 108 (90 BASE) MCG/ACT inhaler Inhale 2 puffs into the lungs every 4 (four) hours as needed for shortness of breath.   Yes Historical Provider, MD  albuterol (PROVENTIL) (2.5 MG/3ML) 0.083% nebulizer solution Take 2.5 mg by nebulization. 1 ampule twice daily and as needed (making 4 doses daily) 10/11/12  Yes Elsie Stain, MD  Ascorbic Acid (VITAMIN C) 500 MG/5ML LIQD 500 mg by PEG Tube route at bedtime.    Yes Historical Provider, MD  baclofen (GABLOFEN) 40000 MCG/20ML SOLN 385.2 mcg by Intrathecal route continuous.  05/04/12  Yes Jodi Geralds, MD  calcium carbonate, dosed in mg elemental calcium, 1250 MG/5ML 1,000 mg of elemental calcium by PEG Tube route 2 (two) times daily. 4 cc via peg tube - ON HOLD UNTIL 11/17/12 PER DR. [name]   Yes Historical Provider, MD  dicyclomine (BENTYL) 10 MG/5ML syrup Take by mouth every 8 (eight) hours as needed. Not to exceed 5 doses in 1 wk   Yes Historical Provider, MD  diphenoxylate-atropine (LOMOTIL) 2.5-0.025 MG/5ML liquid Take by mouth 4 (four) times daily as needed for diarrhea or loose stools. 1-2 tsp via feeding tube q6h prn loose stools   Yes Historical Provider, MD  fluticasone (FLONASE) 50 MCG/ACT nasal spray 1 spray. 03/09/13 03/09/14 Yes Historical Provider, MD  fluticasone (FLONASE) 50 MCG/ACT nasal spray Place 1 spray into the nose. 03/09/13 03/09/14 Yes Historical Provider, MD  folic acid (FOLVITE) 1 MG tablet Take 1 mg by mouth daily.   Yes Historical Provider, MD  gabapentin (NEURONTIN) 250  MG/5ML solution TAKE 16 ML BY MOUTH 3 TIMES A DAY 01/19/13  Yes Rockwell Germany, NP  lansoprazole (PREVACID SOLUTAB) 30 MG disintegrating tablet Take 30 mg by mouth daily.  09/07/12  Yes Lafayette Dragon, MD  levETIRAcetam (KEPPRA) 100 MG/ML solution Give 17.5 mls by PEG tube twice per day. 11/17/12  Yes Rockwell Germany, NP  Multiple Vitamin (MULTIVITAMIN) LIQD 10 mLs by PEG Tube route daily.   Yes Historical Provider, MD  Multiple Vitamins-Minerals (ZINC PO) 5 mLs by PEG Tube route daily. 244 mg / 5 mL zinc solution   Yes Historical Provider, MD  mupirocin ointment (BACTROBAN) 2 % Apply 1 application topically as needed (to G-T site). 09/07/12  Yes Lafayette Dragon,  MD  polyethylene glycol (MIRALAX / GLYCOLAX) packet Take 17 g by mouth daily.   Yes Historical Provider, MD  Pseudoephedrine HCl (SUDAFED CHILDRENS) 15 MG/5ML LIQD Give 15 mLs by tube every 6 (six) hours.   Yes Historical Provider, MD  sodium phosphate (FLEET) enema Place 1 enema rectally. follow package directions PRN   Yes Historical Provider, MD  Water For Irrigation, Sterile (FREE WATER) SOLN Place 150 mLs into feeding tube every 4 (four) hours while awake. Until jevity complete   Yes Historical Provider, MD  acetaminophen (TYLENOL) 160 MG/5ML solution Place 10.2 mLs (325 mg total) into feeding tube every 4 (four) hours as needed for fever. 11/29/12   Samella Parr, NP  Diazepam (DIASTAT RE) Place 12.5 mg rectally as needed (Give after 2 minutes of persistent seizure).     Historical Provider, MD  docusate (COLACE) 50 MG/5ML liquid Place 50 mg into feeding tube daily as needed for mild constipation.    Historical Provider, MD  ENSURE PLUS (ENSURE PLUS) LIQD Take 237 mLs by mouth 4 (four) times daily.     Historical Provider, MD  feeding supplement (PRO-STAT SUGAR FREE 64) LIQD Place 30 mLs into feeding tube 2 (two) times daily. 09/22/12   Corky Sox, MD  furosemide (LASIX) 10 MG/ML solution Place 20 mg into feeding tube daily as needed.      Historical Provider, MD  metoCLOPramide (REGLAN) 5 MG/5ML solution 5 mg. 03/09/13 04/08/13  Historical Provider, MD  nystatin cream (MYCOSTATIN) Apply topically. 3-4 times daily 11/29/12   Samella Parr, NP  OnabotulinumtoxinA (BOTOX IM) Inject 1 mL into the muscle every 3 (three) months. Last taken on 10-11-12    Historical Provider, MD  [place] 1 application topically as needed (after diaper changes). Balmex and Bag Balm paste made and applied after diaper changes    Historical Provider, MD   Allergies Allergies  Allergen Reactions  . Adhesive [Tape]     Rips skin off  . Depakote [Divalproex Sodium]     Causes pancreatitis     Family History Family History  Problem Relation Age of Onset  . Adopted: Yes   Social History  reports that he has never smoked. He has never used smokeless tobacco. He reports that he does not drink alcohol or use illicit drugs.  Review Of Systems:  A review of 14 systems was negative except as stated in the HPI.   Brief patient description:  23 y/o with static encephalopathy, seizure d/o and recurrent aspiration pneumonia.   Events Since Admission: Placed on BiPap Rocephin 1g x1 3/14 Levaquin 3/14-  Current Status:  Vital Signs: Pulse Rate:  [123-128] 128 (03/14 2314) Resp:  [27-28] 28 (03/14 2314) BP: (140)/(65) 140/65 mmHg (03/14 2314) SpO2:  [96 %-97 %] 97 % (03/14 2314) FiO2 (%):  [40 %] 40 % (03/14 2314)  Physical Examination: General:  Laying in bed, sleeping, no apparent distress, face flushed  Neuro:  Sleeping with bipap on HEENT:  BiPap mask in place Neck:  Supple, no masses Cardiovascular:  Rate 120s, normal rhythm, no MRG Lungs:  No wheezes, diminished breath sounds throughout Abdomen:  Soft, NTND, +BS, no HSM Musculoskeletal:  Contractures, no edema Skin:  Flushed, no rashes, skin break down or other lesions.  Active Problems:   Respiratory failure   ASSESSMENT AND PLAN  PULMONARY No results  found for this basename: PHART, PCO2, PCO2ART, PO2ART, HCO3, O2SAT,  in the last 168 hours Ventilator Settings: Vent Mode:  [-]  BIPAP FiO2 (%):  [40 %] 40 % CXR:  Per outside CXR report no acute cardiopulmonary process   A:  Hypercarbic and hypoxic respiratory failure, likely aspiration pneumonia P:   PCO2 on arrival to [facility] was 58 - not clear if this was before or after bipap initiated, pH 7.37 - Continue bipap overnight, wean to [place] in AM if tolerated, does wear bipap at night at home - Continue albuterol q4 - Given 125mg  solumedrol in ED, will not continue steroids for now. - Repeat CXR in AM - Start levaquin - has responded well to this in the past   CARDIOVASCULAR No results found for this basename: TROPONINI, LATICACIDVEN,  O2SATVEN, PROBNP,  in the last 168 hours  Lines: port-a-cath  A: Tachycardia P:  Likely secondary to volume depletion - bolus 1L NS  RENAL No results found for this basename: NA, K, CL, CO2, BUN, CREATININE, CALCIUM, MG, PHOS,  in the last 168 hours Intake/Output   None      A:  No acute issues P:   Follow urine output and BUN/Cr  GASTROINTESTINAL No results found for this basename: AST, ALT, ALKPHOS, BILITOT, PROT, ALBUMIN,  in the last 168 hours  A:  G-J tube P:   Does not take anything by mouth Hold tube feeds overnight - restart in AM if stable.  Does Jevity 1.5 80cc/hr 4.5 cans per day (J-port only) Free water flushes by J port   HEMATOLOGIC No results found for this basename: HGB, HCT, PLT, INR, APTT,  in the last 168 hours A:  Plethoric P:  Repeat CBC in AM after IV hydration   INFECTIOUS No results found for this basename: WBC, PROCALCITON,  in the last 168 hours Cultures: none Antibiotics: Rocephin x1 3/14 Levaquin 3/14-  A:  Likely aspiration pna P:   Treat with levaquin for 10-14 days depending on improvement Per mother no viral exposures   NEUROLOGIC  A:  Seizure d/o  P:   Continue keppra,  neurontin Family has very close relationship with Dr. Gaynell Face, their neurologist.  BEST PRACTICE / DISPOSITION Level of Care:  ICU Primary Service:  PCCM Consultants:  none Code Status:  full Diet:  NPO DVT Px:  Heparin, SCDs GI Px:  PPI Skin Integrity:  good Social / Family:  At bedside.  I spent 35 minutes of critical care time in the care of this patient seperate from procedures which are documented elsewhere   Lincoln Maxin, Mariann Laster., M.D. Pulmonary and [place] Pager: [phone]  04/15/2013, 12:14 AM

## 2013-04-16 ENCOUNTER — Inpatient Hospital Stay (HOSPITAL_COMMUNITY): Payer: Medicaid Other

## 2013-04-16 LAB — CBC
HCT: 38.9 % — ABNORMAL LOW (ref 39.0–52.0)
Hemoglobin: 13 g/dL (ref 13.0–17.0)
MCH: 32.4 pg (ref 26.0–34.0)
MCHC: 33.4 g/dL (ref 30.0–36.0)
MCV: 97 fL (ref 78.0–100.0)
Platelets: 86 10*3/uL — ABNORMAL LOW (ref 150–400)
RBC: 4.01 MIL/uL — AB (ref 4.22–5.81)
RDW: 14.3 % (ref 11.5–15.5)
WBC: 4.6 10*3/uL (ref 4.0–10.5)

## 2013-04-16 LAB — GLUCOSE, CAPILLARY
GLUCOSE-CAPILLARY: 144 mg/dL — AB (ref 70–99)
GLUCOSE-CAPILLARY: 77 mg/dL (ref 70–99)
Glucose-Capillary: 56 mg/dL — ABNORMAL LOW (ref 70–99)
Glucose-Capillary: 70 mg/dL (ref 70–99)
Glucose-Capillary: 106 mg/dL — ABNORMAL HIGH (ref 70–99)
Glucose-Capillary: 78 mg/dL (ref 70–99)

## 2013-04-16 LAB — BASIC METABOLIC PANEL
BUN: 12 mg/dL (ref 6–23)
CHLORIDE: 97 meq/L (ref 96–112)
CO2: 27 meq/L (ref 19–32)
Calcium: 8.7 mg/dL (ref 8.4–10.5)
Creatinine, Ser: 0.57 mg/dL (ref 0.50–1.35)
GFR calc non Af Amer: >90 mL/min (ref >90)
Glucose, Bld: 419 mg/dL — ABNORMAL HIGH (ref 70–99)
POTASSIUM: 3.2 meq/L — AB (ref 3.7–5.3)
SODIUM: 136 meq/L — AB (ref 137–147)

## 2013-04-16 MED ORDER — ALBUTEROL SULFATE (2.5 MG/3ML) 0.083% IN NEBU
2.5000 mg | INHALATION_SOLUTION | Freq: Four times a day (QID) | RESPIRATORY_TRACT | Status: DC
  Administered 2013-04-16 – 2013-04-17 (×3): 2.5 mg via RESPIRATORY_TRACT
  Filled 2013-04-16 (×3): qty 3

## 2013-04-16 MED ORDER — PANTOPRAZOLE SODIUM 40 MG PO PACK
40.0000 mg | PACK | Freq: Every day | ORAL | Status: DC
  Administered 2013-04-16 – 2013-04-17 (×2): 40 mg
  Filled 2013-04-16 (×2): qty 20

## 2013-04-16 MED ORDER — POTASSIUM CHLORIDE 20 MEQ/15ML (10%) PO LIQD: 40.0000 meq | Freq: Once | ORAL | Status: AC

## 2013-04-16 MED ORDER — NON FORMULARY: 5.0000 mL | Freq: Every day | Status: DC

## 2013-04-16 MED ORDER — JEVITY 1.5 CAL/FIBER PO LIQD: 1067.0000 mL | ORAL | Status: DC

## 2013-04-16 MED ORDER — FUROSEMIDE 10 MG/ML IJ SOLN
INTRAMUSCULAR | Status: AC
  Filled 2013-04-16: qty 4

## 2013-04-16 MED ORDER — JEVITY 1.5 CAL/FIBER PO LIQD
1000.0000 mL | ORAL | Status: DC
  Administered 2013-04-16: 1000 mL
  Filled 2013-04-16 (×3): qty 1000

## 2013-04-16 MED ORDER — POTASSIUM CHLORIDE 20 MEQ/15ML (10%) PO LIQD
ORAL | Status: AC
  Administered 2013-04-16: 40 meq
  Filled 2013-04-16: qty 30

## 2013-04-16 MED ORDER — NONFORMULARY OR COMPOUNDED ITEM
5.0000 mL | Freq: Every day | Status: DC
  Administered 2013-04-17: 5 mL
  Filled 2013-04-16: qty 1

## 2013-04-16 NOTE — Progress Notes (Signed)
[facility] ADULT ICU REPLACEMENT PROTOCOL FOR AM LAB REPLACEMENT ONLY  The patient does not apply for the [facility] Adult ICU Electrolyte Replacment Protocol based on the criteria listed below:    Is urine output >/= 0.5 ml/kg/hr for the last 6 hours? no Patient's UOP is unknown    If a panic level lab has been reported, has the CCM MD in charge been notified? yes.   Physician:  Penne Lash, MD  [facility] 04/16/2013 6:25 AM

## 2013-04-16 NOTE — Progress Notes (Signed)
PULMONARY / CRITICAL CARE MEDICINE   Name: Jeffery [name] MRN: [id number] DOB: 25-Oct-1990    ADMISSION DATE:  04/14/2013  REFERRING MD :  [facility]  CHIEF COMPLAINT:  Tachycardia  BRIEF PATIENT DESCRIPTION:  23 yo male with hx of CP 2nd to [place] and premature birth brought to ER with tachycardia, diaphoresis, hypoxia with concern for recurrent aspiration PNA.    SIGNIFICANT EVENTS: 3/14 Admit  3/16 Transfer to SDU  STUDIES:   LINES / TUBES: Rt chest port >> G/J tube >>  CULTURES: Blood ([facility]) 3/14 >>  ANTIBIOTICS: Levaquin 3/15 >>  SUBJECTIVE:  More alert.  Transitioned off BiPAP this AM.  VITAL SIGNS: Temp:  [97.9 F (36.6 C)-99.4 F (37.4 C)] 98.2 F (36.8 C) (03/16 0800) Pulse Rate:  [79-124] 104 (03/16 0800) Resp:  [11-30] 23 (03/16 0800) BP: (114-151)/(49-102) 118/81 mmHg (03/16 0800) SpO2:  [92 %-100 %] 99 % (03/16 0802) FiO2 (%):  [30 %] 30 % (03/16 0400) Weight:  [116 lb (52.617 kg)] 116 lb (52.617 kg) (03/16 0446) VENTILATOR SETTINGS: Vent Mode:  [-] BIPAP FiO2 (%):  [30 %] 30 % Set Rate:  [12 bmp] 12 bmp INTAKE / OUTPUT: Intake/Output     03/15 0701 - 03/16 0700 03/16 0701 - 03/17 0700   I.V. (mL/kg) 977.1 (18.6)    NG/GT 150    IV Piggyback     Total Intake(mL/kg) 1127.1 (21.4)    Net +1127.1          Urine Occurrence 3 x    Stool Occurrence 2 x      PHYSICAL EXAMINATION: General: No distress Neuro:  Smiles when speaking to him HEENT:  No oral lesions Cardiovascular:  Regular, tachycardic, no murmur, Port site Rt chest clean Lungs: no wheeze Abdomen:  Soft, non tender, G/J tube in LUQ Musculoskeletal: contracted Skin:  No rashes  LABS:  CBC  Recent Labs Lab 04/15/13 0055 04/16/13 0424  WBC 6.8 4.6  HGB 14.7 13.0  HCT 43.6 38.9*  PLT 101* 86*   BMET  Recent Labs Lab 04/15/13 0055 04/16/13 0424  NA 144 136*  K 3.8 3.2*  CL 102 97  CO2 27 27  BUN 18 12  CREATININE 0.54 0.57  GLUCOSE 132* 419*    Electrolytes  Recent Labs Lab 04/15/13 0055 04/16/13 0424  CALCIUM 9.0 8.7   Glucose  Recent Labs Lab 04/15/13 1245 04/15/13 1708 04/15/13 1709 04/15/13 2029 04/15/13 2356 04/16/13 0423  GLUCAP 88 56* 70 77 144* 106*    Imaging Dg Chest Port 1 View  04/16/2013   CLINICAL DATA:  Respiratory distress.  EXAM: PORTABLE CHEST - 1 VIEW  COMPARISON:  Chest x-ray 04/15/2013.  FINDINGS: Right internal jugular single-lumen power porta cath with tip terminating in the mid to distal superior vena cava. Lung volumes are very low. Minimal bibasilar subsegmental atelectasis. No definite consolidative airspace disease. No pleural effusions. No evidence of pulmonary edema. Heart size is normal. The patient is rotated to the right on today's exam, resulting in distortion of the mediastinal contours and reduced diagnostic sensitivity and specificity for mediastinal pathology. Orthopedic fixation hardware throughout the visualized thoracic spine.  IMPRESSION: 1. Low lung volumes without radiographic evidence of acute cardiopulmonary disease.   Electronically Signed   By: Vinnie Langton M.D.   On: 04/16/2013 07:32   Dg Chest Port 1 View  04/15/2013   CLINICAL DATA:  Respiratory distress  EXAM: PORTABLE CHEST - 1 VIEW  COMPARISON:  DG CHEST 1V  PORT dated 04/15/2013; DG CHEST 1V dated 04/14/2013 at [facility]  FINDINGS: The heart size and mediastinal contours are within normal limits. Both lungs are clear. The visualized skeletal structures are unremarkable. Right-sided Port-A-Cath noted with tip over the mid SVC. Thoracolumbar fusion hardware reidentified with thoracic deformity again noted.  IMPRESSION: No active disease.   Electronically Signed   By: Conchita Paris M.D.   On: 04/15/2013 12:38   Dg Chest Port 1 View  04/15/2013   CLINICAL DATA:  Respiratory distress  EXAM: PORTABLE CHEST - 1 VIEW  COMPARISON:  04/14/2013.  FINDINGS: Harrington rods.  Port-A-Cath unchanged.  The lungs  remain clear without evidence of pneumonia. Mild scarring in the left lung base.  IMPRESSION: No active disease.   Electronically Signed   By: Franchot Gallo M.D.   On: 04/15/2013 05:52   ASSESSMENT / PLAN:  PULMONARY A: Acute respiratory failure 2nd to recurrent aspiration pneumonitis. Hx of OSA >> uses BiPAP at home. P:   BiPAP qhs and prn during the day Oxygen to keep SpO2 > 92% F/u CXR as needed Bronchial hygiene Mother okay with short term intubation if needed >> no trach/long term vent  CARDIOVASCULAR A:  Sinus tachycardia with SIRS in setting of aspiration >> improving. P:  Monitor hemodynamics Even fluid balance  RENAL A:   Hypokalemia. P:   F/u and replace electrolytes as needed  GASTROINTESTINAL A:   Nutrition. P:   Continue tube feeds, free water tube flushes Aspiration precautions Protonix for SUP  HEMATOLOGIC A:   Thrombocytopenia. P:  F/u CBC  SQ heparin for DVT preventions  INFECTIOUS A:   Aspiration pneumonitis. P:   Day 2 of levaquin  ENDOCRINE A:   No acute issues.   P:   Monitor CBG's  NEUROLOGIC A:   Hx of CP, spasticity, seizures, chronic hip pain P:   Continue baclofen pump, neurotin, keppra  Goals of Care >> Mother would not want trach/long term vent support >> otherwise full code.  Updated mother over the phone.  Will transfer to SDU 3/16 >> if stable, then possible d/c home 3/17.  Chesley Mires, MD [facility] Pulmonary/Critical Care 04/16/2013, 8:56 AM Pager:  [phone] After 3pm call: [phone]

## 2013-04-16 NOTE — Progress Notes (Signed)
INITIAL NUTRITION ASSESSMENT  DOCUMENTATION CODES Per approved criteria  -Not Applicable   INTERVENTION: Recommend continuing home TF regimen of Jevity 1.5 @ 4.5 cans per day (1067 mL total) starting at 6 am and running @ 80 ml/hr until total volume has infused via J-tube. Continue free water flushes every 4 hours while awake. TF regimen will provide 1600 kcal, 68 g of protein, and 1410 ml of free water. Home TF regimen meets 100% of calorie and protein needs.   NUTRITION DIAGNOSIS: Inadequate oral intake related to inability to eat as evidenced by NPO.   Goal: Pt to meet >/= 90% of their estimated nutrition needs   Monitor:  Wt trends, TF, labs  Reason for Assessment: Consult for TF  23 y.o. male  Admitting Dx: <principal problem not specified>  ASSESSMENT: 23 y/o man with history of severe CP secondary to grade IV intracranial hemorrhage and premature birth at 25 weeks. He has static encephalopathy, seizure disorder and a history of recurrent aspiration pneumonias. He was last hospitalized in early February at [facility] hospital at which time he underwent conversion of his G tube to a G-J tube to try to limit aspiration events. His mother reports that 4 days prior to admission he had 3 seizures during which he had oxygen desaturations. In the past his seizures have been associated with aspiration events.  Per family, pt's weight has remained stable. Aspiration events have improved since conversion of G tube to G-J tube. Family would like to continue home tube feeding regimen while in the hospital.  Height: Ht Readings from Last 1 Encounters:  04/14/13 4\' 11"  (1.499 m)    Weight: Wt Readings from Last 1 Encounters:  04/16/13 116 lb (52.617 kg)    Ideal Body Weight: 47.7 kg  % Ideal Body Weight: 110%  Wt Readings from Last 10 Encounters:  04/16/13 116 lb (52.617 kg)  12/25/12 116 lb (52.617 kg)  11/29/12 116 lb 2.9 oz (52.7 kg)  11/07/12 116 lb (52.617 kg)   10/30/12 116 lb (52.617 kg)  10/30/12 116 lb (52.617 kg)  10/05/12 116 lb (52.617 kg)  09/22/12 105 lb 13.1 oz (48 kg)  09/13/12 120 lb (54.432 kg)  09/13/12 120 lb (54.432 kg)    Usual Body Weight: 116 lbs  % Usual Body Weight: 100%  BMI:  Body mass index is 23.42 kg/(m^2).  Estimated Nutritional Needs: Kcal: 1500-1700 Protein: 65-75 g Fluid: ~1.7 L  Skin: WNL  Diet Order: NPO  EDUCATION NEEDS: -No education needs identified at this time   Intake/Output Summary (Last 24 hours) at 04/16/13 1129 Last data filed at 04/16/13 0800  Gross per 24 hour  Intake 1547.08 ml  Output      0 ml  Net 1547.08 ml    Last BM: 3/16   Labs:   Recent Labs Lab 04/15/13 0055 04/16/13 0424  NA 144 136*  K 3.8 3.2*  CL 102 97  CO2 27 27  BUN 18 12  CREATININE 0.54 0.57  CALCIUM 9.0 8.7  GLUCOSE 132* 419*    CBG (last 3)   Recent Labs  04/15/13 2029 04/15/13 2356 04/16/13 0423  GLUCAP 77 144* 106*    Scheduled Meds: . albuterol  2.5 mg Nebulization Q6H WA  . ascorbic acid  500 mg Oral Daily  . calcium carbonate (dosed in mg elemental calcium)  1,000 mg of elemental calcium Per Tube BID  . fluticasone  1 spray Each Nare Daily  . folic acid  1  mg Per Tube Daily  . free water  150 mL Per Tube Q4H while awake  . furosemide      . gabapentin  800 mg Per Tube 3 times per day  . heparin  5,000 Units Subcutaneous 3 times per day  . levETIRAcetam  1,750 mg Oral BID  . levofloxacin (LEVAQUIN) IV  750 mg Intravenous Q24H  . multivitamin  10 mL Per Tube Daily  . pantoprazole sodium  40 mg Per Tube Daily  . polyethylene glycol  17 g Per J Tube Daily  . zinc sulfate  220 mg Per Tube Daily    Continuous Infusions: . dextrose 75 mL/hr at 04/16/13 0981    Past Medical History  Diagnosis Date  . CP (cerebral palsy), spastic, quadriplegic   . Epilepsy   . Osteoporosis   . Inguinal hernia   . Undescended testes   . Seasonal allergies   . IVH (intraventricular  hemorrhage)     Grade IV  . Hip dislocation, bilateral   . Seizures   . Hx: UTI (urinary tract infection)   . Dysphagia   . Otitis media   . Retinopathy of prematurity   . 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 Neuromuscular scoliosis   . Osteoporosis   . Complex partial seizures   . Generalized convulsive epilepsy without mention of intractable epilepsy   . Epilepsy   . Sinus bradycardia     HR drops to 38-40 while sleeping  . Sleep apnea     BiPAP  . Blister of right heel     fluid filled; origin unknown  . Kidney stones     ?  Marland Kitchen Pneumonia      chronic pneumonia ,respitory failure dx Augest 2014    Past Surgical History  Procedure Laterality Date  . Excision of moles    . Peg placement      With multiple changes  . Gastrostomy tube, change / reposition  12/15/2010       . Button change  12/15/2010    Procedure: BUTTON CHANGE;  Surgeon: Gatha Mayer, MD;  Location: [facility];  Service: Endoscopy;  Laterality: N/A;  . Peg placement  10/07/2011    Procedure: PERCUTANEOUS ENDOSCOPIC GASTROSTOMY (PEG) REPLACEMENT;  Surgeon: Lafayette Dragon, MD;  Location: [facility];  Service: Endoscopy;  Laterality: N/A;  Needs 18 F 2.5 button ordered-dl  . Inguinal hernia repair Bilateral 1992  . Retinopathy of prematurity surgery  1992  . Achilles tendon lengthening  12/1998  . Soft tissue releases  wrists and fingers  12/1998  . Intrathecal baclofen pump placement  07/25/2000  . Peg placement N/A 06/05/2012    Procedure: PERCUTANEOUS ENDOSCOPIC GASTROSTOMY (PEG) REPLACEMENT;  Surgeon: Lafayette Dragon, MD;  Location: [facility];  Service: Endoscopy;  Laterality: N/A;  button 41fr.2.5cm  . Eye surgery Bilateral     Retinal   . Back surgery      Harrington Rods in back needs to be log rolled  . Tendon repair Bilateral 09/05/2012    Procedure: LENGTHENING OF DIGITAL FLEXOR TENDONS BILTERAL HANDS;  Surgeon: Jolyn Nap, MD;  Location: [place];  Service: Orthopedics;  Laterality:  Bilateral;  . Hand surgery Bilateral Aug. 2014  . Peg placement N/A 09/13/2012    Procedure: PERCUTANEOUS ENDOSCOPIC GASTROSTOMY (PEG) REPLACEMENT;  Surgeon: Lafayette Dragon, MD;  Location: [facility];  Service: Endoscopy;  Laterality: N/A;  . Flexible sigmoidoscopy N/A 10/30/2012    Procedure: FLEXIBLE SIGMOIDOSCOPY;  Surgeon: Herbie Baltimore  Ursula Beath, MD;  Location: [facility];  Service: Endoscopy;  Laterality: N/A;  . Peg placement N/A 11/15/2012    Procedure: PERCUTANEOUS ENDOSCOPIC GASTROSTOMY (PEG) REPLACEMENT;  Surgeon: Jeryl Columbia, MD;  Location: [facility] END[facility];  Service: Endoscopy;  Laterality: N/A;    Terrace Arabia RD, LDN

## 2013-04-16 NOTE — Progress Notes (Signed)
Pt.'s mom refuses BIPAP QHS for pt. Stating that "he is going to be getting tube feeds all night." BIPAP is at the bedside on standby in needed. Will make RN aware.

## 2013-04-16 NOTE — Progress Notes (Signed)
RD consulted for TF recommendations earlier today. RN called dietitian office requesting TF order. RD received MD permission to initiate TF protocol.   Initiate Jevity 1.5 @ 45 ml/hr via J-tube and keep at goal rate of 45 ml/hr continuous.  At goal rate, tube feeding regimen will provide 1620 kcal, 69 grams of protein, and 820 ml of H2O. Continue free water flushes every 4 hours while awake for a total of 1420 ml of free water.   RD to follow-up tomorrow.  Pryor Ochoa RD, LDN Inpatient Clinical Dietitian Pager: [phone] After Hours Pager: [phone]

## 2013-04-17 LAB — GLUCOSE, CAPILLARY
GLUCOSE-CAPILLARY: 127 mg/dL — AB (ref 70–99)
GLUCOSE-CAPILLARY: 91 mg/dL (ref 70–99)
GLUCOSE-CAPILLARY: 103 mg/dL — AB (ref 70–99)
Glucose-Capillary: 123 mg/dL — ABNORMAL HIGH (ref 70–99)

## 2013-04-17 LAB — CBC
HCT: 43.4 % (ref 39.0–52.0)
Hemoglobin: 14.5 g/dL (ref 13.0–17.0)
MCH: 32 pg (ref 26.0–34.0)
MCHC: 33.4 g/dL (ref 30.0–36.0)
MCV: 95.8 fL (ref 78.0–100.0)
Platelets: 106 10*3/uL — ABNORMAL LOW (ref 150–400)
RBC: 4.53 MIL/uL (ref 4.22–5.81)
RDW: 13.9 % (ref 11.5–15.5)
WBC: 4.5 10*3/uL (ref 4.0–10.5)

## 2013-04-17 LAB — BASIC METABOLIC PANEL
BUN: 8 mg/dL (ref 6–23)
CO2: 29 mEq/L (ref 19–32)
Calcium: 9.2 mg/dL (ref 8.4–10.5)
Chloride: 99 mEq/L (ref 96–112)
Creatinine, Ser: 0.54 mg/dL (ref 0.50–1.35)
Glucose, Bld: 111 mg/dL — ABNORMAL HIGH (ref 70–99)
POTASSIUM: 3.7 meq/L (ref 3.7–5.3)
SODIUM: 142 meq/L (ref 137–147)

## 2013-04-17 MED ORDER — LEVOFLOXACIN 750 MG PO TABS
750.0000 mg | ORAL_TABLET | Freq: Every day | ORAL | Status: DC
  Filled 2013-04-17: qty 1

## 2013-04-17 MED ORDER — HEPARIN SOD (PORK) LOCK FLUSH 100 UNIT/ML IV SOLN
500.0000 [IU] | INTRAVENOUS | Status: AC | PRN
  Administered 2013-04-17: 500 [IU]

## 2013-04-17 NOTE — Discharge Summary (Signed)
Physician Discharge Summary       Patient ID: Jeffery [name] MRN: [id number] DOB/AGE: [birth year] 23 y.o.  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 He has static encephalopathy, seizure disorder and a history of recurrent aspiration pneumonias. He was last hospitalized in early February at [facility] hospital at which time he underwent conversion of his G tube to a G-J tube to try to limit aspiration events. His mother reports that 4 days prior to admission he had 3 seizures during which he had oxygen desaturations. In the past his seizures have been associated with aspiration events. The day prior to admission he became more lethargic and less interactive, he then began to produce yellow sputum. The day of admission he became diaphoretic and tachycardic, then hypoxic. At that time EMS was called. His mother started levaquin the day prior to admission as he has responded well to this in the past. Per report on EMS arrival Jeffery [name] was satting in the low 80s on a non-rebreather and had a heart rate in the 170s. He was tachypneic and using accessory muscles to breathe. He also had expiratory wheezes and rhonchi. He was started on BiPap with improvement in his saturations and work of breathing.  He was admitted to [facility] for respiratory failure 2/2 presumed aspiration PNA. He was placed on supplemental O2, PO Levaquin, and continued his home regimen. He improved quickly. As of 3/15 he was much more alert and smiling, and his O2 sat was adequate on minimal  supplemental O2. His Chest xray also appeared clear at this point, one day after admission. 3/16 he was transitioned off of PRN BiPAP, and was deemed a candidate for discharge, but it was determined that he would remain one more night and if he did not require BiPAP he would go home 3/17. 3/17 he will be discharged to home.       Discharge Plan by diagnoses   Acute respiratory failure 2nd to recurrent aspiration pneumonitis (resolved) Sinus tachycardia with SIRS in setting of aspiration (resolved) Hypokalemia (resolved) Thrombocytopenia (appears chronic) Aspiration pneumonitis  Hx of CP, spasticity, seizures, chronic hip pain  Discharge Plan: -Supplemental O2 and prn BiPAP as needed -PO Levaquin to complete a 7 day course, last day 3/19 -Resume home medications, nutrition -Follow up with PCP (Dr. Jobe Igo) in 2 weeks.   Significant Hospital tests/ studies/ interventions and procedures  Consults  Discharge Exam: BP 152/76  Pulse 101  Temp(Src) 98.6 F (37 C) (Oral)  Resp 18  Ht 4\' 11"  (1.499 m)  Wt 56.246 kg (124 lb)  BMI 25.03 kg/m2  SpO2 91%  General: No distress  Neuro: Smiles when speaking to him  HEENT: No oral lesions  Cardiovascular: Regular, tachycardic, no murmur, Port site Rt chest clean  Lungs: no wheeze  Abdomen: Soft, non tender, G/J tube in LUQ  Musculoskeletal: contracted  Skin: No rashes   Labs at discharge Lab Results  Component Value Date   CREATININE 0.54 04/17/2013   BUN 8 04/17/2013   NA 142 04/17/2013   K 3.7 04/17/2013  CL 99 04/17/2013   CO2 29 04/17/2013   Lab Results  Component Value Date   WBC 4.5 04/17/2013   HGB 14.5 04/17/2013   HCT 43.4 04/17/2013   MCV 95.8 04/17/2013   PLT 106* 04/17/2013   Lab Results  Component Value Date   ALT 70* 11/22/2012   AST 39* 11/22/2012   ALKPHOS 149* 11/22/2012   BILITOT 1.5* 11/22/2012   Lab Results  Component Value Date   INR 1.02 11/28/2012   INR 1.19 09/16/2012    Current radiology  studies Dg Chest Port 1 View  04/16/2013   CLINICAL DATA:  Respiratory distress.  EXAM: PORTABLE CHEST - 1 VIEW  COMPARISON:  Chest x-ray 04/15/2013.  FINDINGS: Right internal jugular single-lumen power porta cath with tip terminating in the mid to distal superior vena cava. Lung volumes are very low. Minimal bibasilar subsegmental atelectasis. No definite consolidative airspace disease. No pleural effusions. No evidence of pulmonary edema. Heart size is normal. The patient is rotated to the right on today's exam, resulting in distortion of the mediastinal contours and reduced diagnostic sensitivity and specificity for mediastinal pathology. Orthopedic fixation hardware throughout the visualized thoracic spine.  IMPRESSION: 1. Low lung volumes without radiographic evidence of acute cardiopulmonary disease.   Electronically Signed   By: Vinnie Langton M.D.   On: 04/16/2013 07:32   Dg Chest Port 1 View  04/15/2013   CLINICAL DATA:  Respiratory distress  EXAM: PORTABLE CHEST - 1 VIEW  COMPARISON:  DG CHEST 1V PORT dated 04/15/2013; DG CHEST 1V dated 04/14/2013 at [facility]  FINDINGS: The heart size and mediastinal contours are within normal limits. Both lungs are clear. The visualized skeletal structures are unremarkable. Right-sided Port-A-Cath noted with tip over the mid SVC. Thoracolumbar fusion hardware reidentified with thoracic deformity again noted.  IMPRESSION: No active disease.   Electronically Signed   By: Conchita Paris M.D.   On: 04/15/2013 12:38    Disposition:  01-Home or Self Care      Discharge Orders   Future Appointments Provider Department Dept Phone   06/27/2013 3:30 PM Jodi Geralds, MD [facility] [phone]   Future Orders Complete By Expires   Call MD for:  difficulty breathing, headache or visual disturbances  As directed    Call MD for:  persistant nausea and vomiting  As directed    Call MD for:  severe uncontrolled pain  As  directed    Call MD for:  temperature >100.4  As directed    Discharge instructions  As directed    Comments:     Resume home medications Levaquin last day 3/19       Medication List    STOP taking these medications       acetaminophen 160 MG/5ML solution  Commonly known as:  TYLENOL     metoCLOPramide 5 MG/5ML solution  Commonly known as:  REGLAN      TAKE these medications       albuterol (2.5 MG/3ML) 0.083% nebulizer solution  Commonly known as:  PROVENTIL  Take 2.5 mg by nebulization. 1 ampule twice daily and as needed (making 4 doses daily)     albuterol 108 (90 BASE) MCG/ACT inhaler  Commonly known as:  PROVENTIL HFA;VENTOLIN HFA  Inhale 2 puffs into the lungs every 4 (four) hours as needed for shortness of breath.     baclofen 60454 MCG/20ML Soln  Commonly known as:  GABLOFEN  385.2 mcg by Intrathecal route continuous.  BOTOX IM  Inject 1 mL into the muscle every 3 (three) months. Last taken on 10-11-12     calcium carbonate (dosed in mg elemental calcium) 1250 MG/5ML  1,000 mg of elemental calcium by PEG Tube route 2 (two) times daily. 4 cc via peg tube - ON HOLD UNTIL 11/17/12 PER DR. [name]     DIASTAT RE  Place 12.5 mg rectally as needed (Give after 2 minutes of persistent seizure).     dicyclomine 10 MG/5ML syrup  Commonly known as:  BENTYL  Take by mouth every 8 (eight) hours as needed. Not to exceed 5 doses in 1 wk     diphenoxylate-atropine 2.5-0.025 MG/5ML liquid  Commonly known as:  LOMOTIL  Take by mouth 4 (four) times daily as needed for diarrhea or loose stools. 1-2 tsp via feeding tube q6h prn loose stools     fluticasone 50 MCG/ACT nasal spray  Commonly known as:  FLONASE  1 spray.     folic acid 1 MG tablet  Commonly known as:  FOLVITE  Take 1 mg by mouth daily.     free water Soln  Place 150 mLs into feeding tube every 4 (four) hours while awake. Until jevity complete     furosemide 10 MG/ML solution  Commonly known as:  LASIX   Place 20 mg into feeding tube daily as needed.     gabapentin 250 MG/5ML solution  Commonly known as:  NEURONTIN  TAKE 16 ML BY MOUTH 3 TIMES A DAY     lansoprazole 30 MG disintegrating tablet  Commonly known as:  PREVACID SOLUTAB  Take 30 mg by mouth daily.     levETIRAcetam 100 MG/ML solution  Commonly known as:  KEPPRA  Give 17.5 mls by PEG tube twice per day.     multivitamin Liqd  10 mLs by PEG Tube route daily.     mupirocin ointment 2 %  Commonly known as:  BACTROBAN  Apply 1 application topically as needed (to G-T site).     nystatin cream  Commonly known as:  MYCOSTATIN  Apply topically. 3-4 times daily     OVER THE COUNTER MEDICATION  Apply 1 application topically as needed (after diaper changes). Balmex and Bag Balm paste made and applied after diaper changes     polyethylene glycol packet  Commonly known as:  MIRALAX / GLYCOLAX  Take 17 g by mouth daily.     sodium phosphate enema  Commonly known as:  FLEET  Place 1 enema rectally. follow package directions PRN     SUDAFED CHILDRENS 15 MG/5ML Liqd  Generic drug:  Pseudoephedrine HCl  Give 15 mLs by tube every 6 (six) hours.     Vitamin C 500 MG/5ML Liqd  500 mg by PEG Tube route at bedtime.     ZINC PO  5 mLs by PEG Tube route daily. 244 mg / 5 mL zinc solution       Follow-up Information   Follow up with [name], DAVID C, MD. Schedule an appointment as soon as possible for a visit in 2 weeks.   Specialty:  Internal Medicine   Contact information:   [place] [address] [phone]       Discharged Condition: fair  Physician Statement:   The Patient was personally examined, the discharge assessment and plan has been personally reviewed and I agree with ACNP Hoffman's assessment and plan. > 30 minutes of time have been dedicated to discharge assessment, planning  and discharge instructions.   Georgann Housekeeper, ACNP [facility] Pulmonology/Critical Care Pager [phone]  or [phone]   Chesley Mires, MD [facility] Pulmonary/Critical Care 04/17/2013, 11:34 AM Pager:  [phone] After 3pm call: [phone]

## 2013-04-19 ENCOUNTER — Telehealth: Payer: Self-pay | Admitting: Family

## 2013-04-19 DIAGNOSIS — Z95828 Presence of other vascular implants and grafts: Secondary | ICD-10-CM

## 2013-04-19 NOTE — Telephone Encounter (Signed)
I faxed the order to [place]. TG

## 2013-04-19 NOTE — Telephone Encounter (Signed)
I spoke with mother and gave her the a had for accessing the port in the setting of an emergency.  I have to look up the supplies that we need to order so that she can do this.

## 2013-04-19 NOTE — Telephone Encounter (Signed)
[name] called. She said that Friday night/early Sat AM, Jeffery [name] had respiratory distress and she had to call EMS. They took care of his respiratory needs but then had him in the ambulance for 20 minutes trying to start an IV because they are not allowed to access the port. Finally they took him to ER, where he was admitted and treated with Bipap etc for presumed aspiration pneumonia. He was discharged Monday evening. Jeffery [name]' question is this - she talked to EMS supervisor and learned that if port is already accessed that EMS can use it. So she wants to know if you would agree/support her accessing port only in emergencies - such as what happened this weekend, so EMS could use the port? Please call Jeffery [name] sometime today at [phone]. TG

## 2013-04-19 NOTE — Telephone Encounter (Signed)
signed

## 2013-04-19 NOTE — Telephone Encounter (Signed)
Mom faxed notes with supply list of what is needed. She asked that we send order to [place] [facility] and tell them to deliver it today. I put order in as DME order and sent to Dr Gaynell Face for signature as I suspect [name] will want his signature on the order. Please send back to me once signed and I will send the order to [facility]. Thanks, Otila Kluver

## 2013-04-23 ENCOUNTER — Telehealth: Payer: Self-pay | Admitting: *Deleted

## 2013-04-23 ENCOUNTER — Encounter: Payer: Self-pay | Admitting: Family

## 2013-04-23 NOTE — Telephone Encounter (Signed)
Jeffery [name] is calling to let Otila Kluver know they called EMS again Friday and Ramsey stayed overnight.  She would like a statement/letter stating mom/legal guardian can access portacath for EMS.  She needs this for her Medicaid book in case of an audit.  Her fax number is [phone].

## 2013-04-23 NOTE — Telephone Encounter (Signed)
Letter is written and ready for Dr Gaynell Face signature. TG

## 2013-04-24 NOTE — Telephone Encounter (Signed)
Letter faxed as requested. TG 

## 2013-04-30 ENCOUNTER — Telehealth: Payer: Self-pay | Admitting: Family

## 2013-04-30 NOTE — Telephone Encounter (Signed)
I reviewed the note.  I do not plan to make changes in his current treatment.

## 2013-04-30 NOTE — Telephone Encounter (Signed)
[name] left a message to let you know that Jeffery [name] had a grand mal sz Sun @ 1:45 AM. She heard monitor alarm and went to him. The sats did not drop, but he was hyperventilating. He was red in face. She put 02 because of seizure but he wasn't blue or ashen. The sat monitor couldn't register sats because he was shaking so hard. She witnessed 1 min of seizure when she was in the room, doesn't know how long it was going on before she went in. He was post ictal afterwards - that is not usual for him. She also wanted you to know that some records that you had wanted are in the mail to you. She said no need to call back. Mom's number is [phone]. TG

## 2013-05-04 ENCOUNTER — Telehealth: Payer: Self-pay | Admitting: *Deleted

## 2013-05-04 NOTE — Telephone Encounter (Signed)
I am not convinced that these are seizures.  This could very well be a parasomnia.  We can assess this is his having nightly events by a prolonged ambulatory EEG which I would be happy to arrange.  Mother is going to watch and will inform me.  One reason that I am skeptical about seizures is that his pulse did not increase during his seizure-like behavior which is unusual.

## 2013-05-04 NOTE — Telephone Encounter (Signed)
[name] called and stated that Jeffery [name] is having seizures when he is sleeping and she knows that it's seizures that he is having because of the way his mouth is twisted, she said that he had a seizure last Sunday morning and he had another one last night around 10:30 pm lasting about a minute, his pulse stayed in the 50's range which is normal for him she says. [name] was unable to wake him up during while the seizure was happening so she rubbed his chest and stroked his head until it was over. She continued to monitor him for a short while after the seizure was over, he stayed asleep never woke up from his sleep and did not have any more seizures.  [name] says that this is new for him to have seizures during his sleep time, she can be reached at [phone].    Thanks,  Meredeth Ide.

## 2013-05-07 ENCOUNTER — Telehealth: Payer: Self-pay | Admitting: Family

## 2013-05-07 DIAGNOSIS — G40209 Localization-related (focal) (partial) symptomatic epilepsy and epileptic syndromes with complex partial seizures, not intractable, without status epilepticus: Secondary | ICD-10-CM

## 2013-05-07 DIAGNOSIS — G40309 Generalized idiopathic epilepsy and epileptic syndromes, not intractable, without status epilepticus: Secondary | ICD-10-CM

## 2013-05-07 NOTE — Telephone Encounter (Signed)
The frequency of his seizures is increasing.  It would be worthwhile to perform a prolonged ventilatory EEG of 24 hours.  I suggested to her that she speak with Dr. Jobe Igo, or Dr. Elonda Husky about the bradycardia.  I'm not certain that they'll place a Holter on him because I don't know whether he would be a candidate for a pacemaker.I don't think he has sleep paralysis.  I don't think that he has thyroid disease.  He that severe spastic paresis to account for his constipation.  When he is having respiratory issues, it is not unreasonable for him to be tachycardia.  I don't think that there is a good reason that explains bradycardia.

## 2013-05-07 NOTE — Telephone Encounter (Signed)
[name] called to report that [name] had an episode today of staring that lasted 22 minutes. She said that he was awake and alert, at usual baseline today, Mom changed him and started his feeding as usual. She said that his O2 sat alarmed at 88% and when she looked at him, he was staring, unresponsive to voice. She put O2 on him and he continued to stare. She continued to try to gain his attention to no avail. She noted that his right eye was fully open and his left eye was half open. She also noted that if she called his name that he did not respond. If she touched him, he had an exaggerated startle response, but still did not look at her but continued to stare. She stopped his tube feeding. Because his heart and respirate rate remained stable, she continued to monitor him but did not call EMS. His O2 sats remained 90-100% with 100% O2 on. This episode lasted 22 minutes. Afterwards he began having eye contact with her and behaving more normally. She removed the O2 and continued to monitor him. He seemed fine, just tired and went to sleep. Mom said that he had several staring episodes on the weekend but that they lasted 30 seconds to 2 minutes. She said that he also has had episodes of his cardiac monitor alarming with very low heart rate of 20's-30's but when checked, he has good O2 sats and apical heart rate was in 70's. However, he has had bradycardia in sleep before while hospitalized so Mom has contacted his cardiologist as well as [place] about replacing the current monitor in the event that it is equipment malfunction. Mom also wonders if the possible bradycardic episodes are related to the staring spells. Mom said that Dr Gaynell Face mentioned doing 48 hour ambulatory EEG and that she is willing to do that or inpatient prolonged EEG if you prefer that. Mom's number is [phone]. TG

## 2013-05-08 ENCOUNTER — Telehealth: Payer: Self-pay | Admitting: Family

## 2013-05-08 NOTE — Telephone Encounter (Signed)
Noted, thank you

## 2013-05-08 NOTE — Telephone Encounter (Signed)
[name] called to let Dr Gaynell Face know that [place] received a new cardiac monitor today. She talked with Dr Jobe Igo and Jeffery [name] will receive Holter Monitoring for 2 weeks, then have follow up after that. No need to call back - she just wanted to let you know. TG

## 2013-05-09 NOTE — Telephone Encounter (Signed)
Ambulatory EEG has been scheduled at [facility] on Tuesday April 28th at 11:00 am with an arrival time of 10:45 am and I have notified [name]. MB

## 2013-05-09 NOTE — Telephone Encounter (Signed)
Noted  

## 2013-05-10 ENCOUNTER — Telehealth: Payer: Self-pay | Admitting: Family

## 2013-05-10 NOTE — Telephone Encounter (Signed)
Mom [name] called to say that [facility] will have Holter Monitor put on Jeffery [name] on  05/17/13 and he will wear it for 2 weeks, and then see the doctor 2 weeks after that. Dr Jobe Igo is going to contact Dr Gaynell Face about the endocrine labs. TG

## 2013-05-11 NOTE — Telephone Encounter (Signed)
Here is Financial controller for [place]. MB

## 2013-05-11 NOTE — Telephone Encounter (Signed)
I spoke with Lattie Haw over at the EEG lab and she confirmed that the heart monitor will not interfere with the ambulatory EEG that he's having on April 23. MB

## 2013-05-16 ENCOUNTER — Other Ambulatory Visit: Payer: Self-pay | Admitting: Family

## 2013-05-24 ENCOUNTER — Ambulatory Visit (HOSPITAL_COMMUNITY)
Admission: RE | Admit: 2013-05-24 | Discharge: 2013-05-24 | Disposition: A | Discharge status: Home / Self Care | Payer: Medicaid Other | Source: Ambulatory Visit | Attending: Pediatrics | Admitting: Pediatrics

## 2013-05-24 DIAGNOSIS — G40209 Localization-related (focal) (partial) symptomatic epilepsy and epileptic syndromes with complex partial seizures, not intractable, without status epilepticus: Secondary | ICD-10-CM

## 2013-05-24 DIAGNOSIS — G40309 Generalized idiopathic epilepsy and epileptic syndromes, not intractable, without status epilepticus: Secondary | ICD-10-CM | POA: Insufficient documentation

## 2013-05-24 NOTE — Progress Notes (Signed)
EEG Completed; Results Pending  

## 2013-05-25 ENCOUNTER — Telehealth: Payer: Self-pay | Admitting: Pediatrics

## 2013-05-25 DIAGNOSIS — G40309 Generalized idiopathic epilepsy and epileptic syndromes, not intractable, without status epilepticus: Secondary | ICD-10-CM

## 2013-05-25 DIAGNOSIS — G40209 Localization-related (focal) (partial) symptomatic epilepsy and epileptic syndromes with complex partial seizures, not intractable, without status epilepticus: Secondary | ICD-10-CM

## 2013-05-25 NOTE — Telephone Encounter (Signed)
I discussed EEG findings which show evidence of right posterior temporal and occipital sharply contoured slow waves with occasional left occipital.  Sometimes his activity generalizes over the right hemisphere.  He comes more prominent when he is asleep during light sleep and disappears during what would appear to be rapid eye movement sleep.  It also lessens when he is fully awake.  There were no electrographic seizures.He had a several minute seizure today at 5 PM with apnea and cyanosis.  I will be considering what medicines might be useful for him.

## 2013-05-28 MED ORDER — VIMPAT 10 MG/ML PO SOLN: ORAL | Status: DC

## 2013-05-28 NOTE — Telephone Encounter (Signed)
[name], Mom, called stating that the patient had a difficult time this weekend and said it was too much information to leave on vm. Pt has an appt at [facility] a call back after 12:00 pm at phone # [phone]

## 2013-05-28 NOTE — Procedures (Signed)
EEG NUMBER:  [id number].  CLINICAL HISTORY:  The patient is a 23 year old male with extreme prematurity, quadriparesis, recurrent seizures, and aspiration pneumonia who has been experiencing increasing frequency of generalized tonic- clonic seizures.  Current medications for seizures include Keppra and Neurontin.  He takes a variety of medications for respiratory disease, vitamins, antihistamines, and medications to treat his skin.  This was a 24 hour 45 minutes prolonged ambulatory EEG.  The recording was monitored by his mother throughout the 24 hours, and he had no epileptic activity during that time. (345.10)  PROCEDURE:  Tracing was carried out on a 32-channel digital Cadwell recorder, reformatted into 16-channel montages with 1 devoted to EKG. The patient was awake, drowsy, and asleep during the recording.  The international 10/20 system lead placement was used.  Recording time 1485.5 minutes.  DESCRIPTION OF FINDINGS:  The dominant frequency for much of the record was not apparent.  From time to time,  however, a 9 Hz posterior rhythm was seen.  Background was predominantly 2-3 Hz 35 microvolt delta range activity that was broadly distributed.  There did not appear to be a difference in amplitude or frequency content between the left and the right hemisphere.  The most striking finding of the record which occurred throughout the record was triphasic spike and slow wave activity.  It was maximal at T6 with the field extending  into 02 more so than 01.  The frequency of this was 1-2 every 10 seconds up to 20 seconds.  The  activity became much more prominent during sleep sometimes occurring 8-10 times over 10 seconds but never became an ictal discharge.  The amplitude increased also with the frequency.  There was a single burst of 6 Hz activity for 1 second but for the most part this was 2-3 Hz.  There appeared to be change in state of arousal during the record. During times of  what appeared to be rapid eye movement sleep, the frequency of interictal activity significantly decreased.  From time to time, there also was F8 electrode artifact.  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    Princess Bruins. Gaynell Face, M.D.   PYP:PJKD D:  05/28/2013 06:53:14  T:  05/28/2013 32:67:12  Job #:  [id number]

## 2013-05-28 NOTE — Telephone Encounter (Signed)
I called mother and the patient had a series of complex partial seizures some brief and some walker all with desaturation and had a prolonged generalized tonic-clonic seizure.  Fortunately his mother does give him an enema and that was messy, she was able to give rectal diazepam gel which brought his seizure under control within 5 minutes.  She is getting ready to go into the hospital for rotator cuff surgery.  We're going to start Vimpat which will be easy to give the of the gastrostomy tube and is a broad-spectrum medication.  No change in Keppra or Neurontin.

## 2013-05-29 ENCOUNTER — Other Ambulatory Visit (HOSPITAL_COMMUNITY): Payer: Medicaid Other

## 2013-05-29 ENCOUNTER — Telehealth: Payer: Self-pay | Admitting: Family

## 2013-05-29 DIAGNOSIS — G40209 Localization-related (focal) (partial) symptomatic epilepsy and epileptic syndromes with complex partial seizures, not intractable, without status epilepticus: Secondary | ICD-10-CM

## 2013-05-29 DIAGNOSIS — Z95828 Presence of other vascular implants and grafts: Secondary | ICD-10-CM

## 2013-05-29 DIAGNOSIS — G40319 Generalized idiopathic epilepsy and epileptic syndromes, intractable, without status epilepticus: Secondary | ICD-10-CM

## 2013-05-29 MED ORDER — LORAZEPAM 4 MG/ML IJ SOLN: INTRAMUSCULAR | Status: DC

## 2013-05-29 NOTE — Telephone Encounter (Signed)
I called Mom, [name], to discuss administration of Ativan via Portacath in the event of seizure. Mom has Portacath access supplies that were ordered in March. She needs medication and written order to give the Ativan. The medication will need to be ordered [name] [address] [facility] [place]. Mom will send me the [employer] form to complete for her to be able to give the Ativan.   Dr Gaynell Face - what dose Lorazepam do you want to order for [name] so want to send order to them soon. Do you want him to receive 2mg /ml or 4mg /ml?  Thanks, Otila Kluver

## 2013-05-29 NOTE — Telephone Encounter (Signed)
The dose of Ativan based on his most recent weight would be 4 mg.Please let me know if there's anything else that you need me to do.

## 2013-05-29 NOTE — Telephone Encounter (Signed)
I talked with Dr Gaynell Face regarding orders for Ativan administration for Jeffery [name]. Jeffery [name]'s treatment plan will be that Jeffery [name] will administer Diastat first, if feasible. If Jeffery [name] is not in a position that she can administer the Diastat, Jeffery [name] will administer the Ativan 4mg  via the Portacath. If Jeffery [name] administers Ativan, 911 will be called so that EMS can monitor his airway and respiratory status. If he tolerates Ativan administration well, this step may be able to be modified in the future. If seizures recur in 12 hours, Jeffery [name] may give Diastat if feasible or may give Ativan again. EMS will again be summoned.  Other steps of this treatment plan will be that the Portacath will be flushed prior to administration of Ativan with 5 cc Normal Saline, then after the medication is administered, the Portacath will be flushed again with 5 cc Normal Saline. Heparin Flush will be administered after the Normal Saline flush. Jeffery [name] does not have the Heparin flushes that are needed for the Portacath when medications are administered and they will be ordered through Jeffery [name]. This plan will be written and faxed to Jeffery [name] tomorrow.

## 2013-05-30 ENCOUNTER — Encounter: Payer: Self-pay | Admitting: Family

## 2013-05-30 NOTE — Telephone Encounter (Signed)
The seizure treatment plan has been typed, signed by Dr Gaynell Face and faxed to [facility]. TG

## 2013-05-30 NOTE — Telephone Encounter (Signed)
[facility] and let [facility] know that the Vimpat was approved. I had actually called them yesterday and let them know that. I called Mom and let her know that the medication was approved as well. TG

## 2013-05-30 NOTE — Telephone Encounter (Signed)
At 3:17 pm, Jeffery [name], called to let you know that [facility] did not get the authorization for the seizure medication yet. You can call her at [phone]

## 2013-06-06 ENCOUNTER — Telehealth: Payer: Self-pay | Admitting: *Deleted

## 2013-06-06 DIAGNOSIS — G40209 Localization-related (focal) (partial) symptomatic epilepsy and epileptic syndromes with complex partial seizures, not intractable, without status epilepticus: Secondary | ICD-10-CM

## 2013-06-06 DIAGNOSIS — Z8709 Personal history of other diseases of the respiratory system: Secondary | ICD-10-CM

## 2013-06-06 DIAGNOSIS — G808 Other cerebral palsy: Secondary | ICD-10-CM

## 2013-06-06 DIAGNOSIS — G40319 Generalized idiopathic epilepsy and epileptic syndromes, intractable, without status epilepticus: Secondary | ICD-10-CM

## 2013-06-06 DIAGNOSIS — M4140 Neuromuscular scoliosis, site unspecified: Secondary | ICD-10-CM

## 2013-06-06 NOTE — Telephone Encounter (Signed)
I called Mom and talked with her about the Lorazepam. She said that she had talked with the pharmacist and they are going to see if they can get her Lorazepam that will be easier to manipulate. She will call me tomorrow and let me know. TG

## 2013-06-06 NOTE — Telephone Encounter (Signed)
Jeffery [name], called at 11:03 am, about the order she faxed today for the Lorazepam order. She mentioned that the Lorazepam that was ordered was in a tamper resistant container and is not user friendly and time consuming. She said she went over the protocol with Hailey, the nurse, and the lorazepam is for 4 mg.

## 2013-06-06 NOTE — Telephone Encounter (Signed)
[name] called back at 10:20 am. She said she has just received the protocol for the medication. She is going to fax it to you right now so you can ok it

## 2013-06-06 NOTE — Telephone Encounter (Signed)
[name], mom, called to today. She said that everything is ok and the surgery went well. She said that she is having a hard time trying to contact someone at [facility] about his Heparin ? Medication. She said the nurse gave her the protocol for the medication for a 50 units. She wants to know what you think he should be taking? 50 units or 100 units. She can be reached at [phone]

## 2013-06-11 MED ORDER — LORAZEPAM 4 MG/ML IJ SOLN: INTRAMUSCULAR | Status: DC

## 2013-06-11 NOTE — Telephone Encounter (Signed)
I talked with Shelda Altes of [facility]. He can get Lorazepam in [employer] syringe without the tamper resistant container for Mom. The problem with the container is that it is time consuming and once opened, the drug must be used within 48 hours, so Mom cannot simply open the container and have the drug ready for use. I sent the Rx to [facility] as discussed with Jonni Sanger and called to let Mom know. She will [facility] once she receives Rx from [facility]. I am writing a letter of medical necessity for Jeffery [name] to receive a new continuous Sat monitor, and will fax it to [facility] when ready. TG

## 2013-06-11 NOTE — Telephone Encounter (Signed)
Mom called me today and said that she had not called back since last week because she had been having some health problems of her own. She said that she ended up having some complications from her surgery and was still having problems from that. Mom wanted Dr Gaynell Face to know that she was still having problems with Jeffery [name]'s O2 sat monitor. She said that it had alarmed 81 and 84% and when it did, he looked fine. He has a portable sat monitor in his bookbag so she checked his sats with that and the sats measured 91 and 94% respectively. She is hopeful that Dr Gaynell Face will agree to sign for the new O2 sat monitor for [facility]. Next, for the Lorazepam, the pharmacist cannot get any other Lorazepam that is not in a tamper resistant package. He checked other commercial pharmacies - such as [facility] and they cannot get it. I will check to see if we can get it through [facility] until the Intranasal Versed protocol is available, and will let Mom know. TG

## 2013-06-12 ENCOUNTER — Encounter: Payer: Self-pay | Admitting: Family

## 2013-06-12 DIAGNOSIS — Z8709 Personal history of other diseases of the respiratory system: Secondary | ICD-10-CM | POA: Insufficient documentation

## 2013-06-12 NOTE — Telephone Encounter (Signed)
Jonni Sanger from pharmacy called and let me know that the medication was available at [facility]. I called mom and left a message to let her know. I faxed the order and letter of medical necessity to [facility] for the new continuous pulse oximetry machine. TG

## 2013-06-13 ENCOUNTER — Telehealth: Payer: Self-pay | Admitting: *Deleted

## 2013-06-13 NOTE — Telephone Encounter (Signed)
[name] called about the lorazepam medication. She needs needles ordered at [facility]. She stated she will have a to go kit for the medicine and will continue the lorazepam until the pt can be can be started  on the nasal versed. She can be reached at [phone].

## 2013-06-13 NOTE — Telephone Encounter (Signed)
[name] called back at 10:14 am. She said that you don't have to call [facility] for the needles. She spoke to [facility] and he said he will give her some syringes with needles. She found the 4 bottles of rocephin but could not find the needles.

## 2013-06-13 NOTE — Telephone Encounter (Signed)
Noted. TG 

## 2013-06-22 ENCOUNTER — Telehealth: Payer: Self-pay | Admitting: *Deleted

## 2013-06-22 NOTE — Telephone Encounter (Signed)
Jeffery [name] stated the patient slept a lot yesterday. She said it could be a side affect from the new medicine. She also believes that the patient may have had "sub-clinical seizures". The mother can be reached at [phone].

## 2013-06-22 NOTE — Telephone Encounter (Signed)
I called and talked with [name]. She said that Jeffery [name] has been titrating up on Vimpat and tolerating that ok. He went up to maintenance dose of 73ml BID on 06/13/13 and has not been sleepy. Yesterday, he got up at 6 AM as usual. Around 10AM, after his morning care and watching TV, he went to sleep, and remained sleepy all day. He would waken briefly and look at family but then go back to sleep. He slept about 6 hours yesterday, which is unusual for him. His lungs are clear, he has no fever or indication of illness. Last night at 7pm, his O2 sats dropped to 79% and he had sucking movements of his mouth like he has done with seizure before. Lasted about 20 seconds. Mom put O2 on when his sats dropped. When sucking motions stopped, sats increased and he became more responsive. He has been fine since. Mom called Call a Nurse last night to ask about giving his usual dose of Vimpat since he had been so sleepy yesterday - Mom suspected sub-clinical seizures after he had the seizure last night and he awakened more like himself, but wanted to be sure it was ok to give med as usual. He awakened at [place] today and has been awake and behaving normally. I told Mom to continue with medication plan, but that if he became sleepy today to call me back. She agreed with this plan. TG

## 2013-06-22 NOTE — Telephone Encounter (Signed)
I reviewed your note and agree with this plan. 

## 2013-06-26 ENCOUNTER — Telehealth: Payer: Self-pay | Admitting: Family

## 2013-06-26 NOTE — Telephone Encounter (Signed)
He has a half hour visit tomorrow I will discuss these issues then.  Thank you for the information.

## 2013-06-26 NOTE — Telephone Encounter (Signed)
[name] called about Jeffery [name]. She said that he has appt tomorrow afternoon for pump refill and that there is no need to call back unless you want to do so. He has still been a little sleepy but not as bad as last week when he slept for 6 hours during the day one day. He had another grand mal seizure on Sat, but it stopped on its own. The seizure was mostly upper body, not really any leg involvement. Mom wanted to give you update on his condition and wanted to remind you that she wants [facility] information when she comes in for his appointment tomorrow. Mom's number is [phone]. TG

## 2013-06-27 ENCOUNTER — Ambulatory Visit (INDEPENDENT_AMBULATORY_CARE_PROVIDER_SITE_OTHER): Payer: Medicaid Other | Admitting: Pediatrics

## 2013-06-27 ENCOUNTER — Encounter: Payer: Self-pay | Admitting: Pediatrics

## 2013-06-27 VITALS — BP 120/90 | HR 112

## 2013-06-27 DIAGNOSIS — G40309 Generalized idiopathic epilepsy and epileptic syndromes, not intractable, without status epilepticus: Secondary | ICD-10-CM

## 2013-06-27 DIAGNOSIS — M4140 Neuromuscular scoliosis, site unspecified: Secondary | ICD-10-CM

## 2013-06-27 DIAGNOSIS — G40209 Localization-related (focal) (partial) symptomatic epilepsy and epileptic syndromes with complex partial seizures, not intractable, without status epilepticus: Secondary | ICD-10-CM

## 2013-06-27 DIAGNOSIS — M4120 Other idiopathic scoliosis, site unspecified: Secondary | ICD-10-CM

## 2013-06-27 DIAGNOSIS — G808 Other cerebral palsy: Secondary | ICD-10-CM
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
## 2013-07-05 ENCOUNTER — Telehealth: Payer: Self-pay | Admitting: *Deleted

## 2013-07-05 NOTE — Telephone Encounter (Signed)
I left a message and asked Mom to call back. TG 

## 2013-07-05 NOTE — Telephone Encounter (Signed)
Jeffery [name], mom, is stating that she is having problems with the patient's PCP, Dr. Jobe Igo, and also having problems with [facility]. Jeffery [name] wanted to speak to Otila Kluver about this. Jeffery [name] can be reached at [phone]

## 2013-07-05 NOTE — Telephone Encounter (Signed)
I called Mom back. She said that she had been having problems with Jeffery [name]'s pump for the feeding tube and that she had reported problems with it twice, and then through troubleshooting with the [facility] staff, it had ultimately been replaced 4 times, but it was reported as having been replaced 5 times. Some of the problems that she listed was that the pump would not set properly, would not alarm when the feeding had ended and would continue to pump air into Jeffery [name]'s stomach. She said that the rep for the pump became involved and talked to her in a very negative manner. She has continued to have problems with the sat monitor and and her efforts to get a better monitor have not been successful. There was an incident where another monitor was provided but they brought a monitor with preset alarm parameters that locked into place but were inappropriate for [facility] Her husband finally figured out how to override those alarms and when that happened, Jeffery [name] was called to Dr Kellogg office for meeting where she says that she was scolded for her behavior requesting feeding pumps and sat monitors. She said that the implication was that she was intentionally doing things to cause problems. She said that she was asked why he needed monitor for heart rate since it was known that his heart rate was going to fluctuate given his problems. She said that Dr Jobe Igo told her that he could "maybe" see why he needed a sat monitor. She said that she was told to use the equipment given to her, and not mess with it. She said that she should not troubleshoot problems and that if air went into his stomach with feeding that it would be absorbed and that she should not worry about it. Jeffery [name] said that if she tried to say anything that she was stopped and not allowed to talk. She said that she experimented with the pump with water after a feeding once to see how long it would take before it would alarm and that it went over 30 minutes pumping air before it  alarmed. She said that she was concerned that he couldn't tolerate that, given his delicate respiratory system - air pushing in his abdomen etc -but was not allowed to voice anything. She said that she was told that she needed to cooperate in order for the relationship to be salvaged with Pain[facility] and Dr Jobe Igo. She said that she finally stopped saying anything, agreed to what they wanted and left because Jeffery [name] needed to have his meds and feedings until she could do something different. Jeffery [name] is devastated and wants your input on a new PCP. She said that her trust and relationship with Dr Jobe Igo is completely severed after the meeting in which she was scolded for trying to take good care of Jeffery [name]. Jeffery [name] was sobbing and does not want to talk with Dr Gaynell Face today. She is fine with call back from me tomorrow. TG

## 2013-07-06 NOTE — Telephone Encounter (Signed)
I spoke with mother for 14 minutes.  I strongly advised that she continue to keep Dr. Jobe Igo as [name] his physician.  I have no explanation for appears to be very rude behavior, but the long run, this is in [name]'s best interest.

## 2013-07-18 ENCOUNTER — Ambulatory Visit (INDEPENDENT_AMBULATORY_CARE_PROVIDER_SITE_OTHER): Payer: Medicaid Other | Admitting: Neurology

## 2013-07-18 DIAGNOSIS — G40209 Localization-related (focal) (partial) symptomatic epilepsy and epileptic syndromes with complex partial seizures, not intractable, without status epilepticus: Secondary | ICD-10-CM

## 2013-07-18 DIAGNOSIS — G808 Other cerebral palsy: Secondary | ICD-10-CM

## 2013-07-18 DIAGNOSIS — G8250 Quadriplegia, unspecified: Secondary | ICD-10-CM

## 2013-07-18 MED ORDER — ONABOTULINUMTOXINA 100 UNITS IJ SOLR
100.0000 [IU] | Freq: Once | INTRAMUSCULAR | Status: AC
  Administered 2013-07-18: 100 [IU] via INTRAMUSCULAR

## 2013-07-18 MED ORDER — ONABOTULINUMTOXINA 100 UNITS IJ SOLR
200.0000 [IU] | Freq: Once | INTRAMUSCULAR | Status: AC
  Administered 2013-07-18: 200 [IU] via INTRAMUSCULAR

## 2013-07-18 NOTE — Progress Notes (Signed)
HPI:   Jeffery [name] came in for EMG guided BOTOX injection for bilateral upper extremity spasticity, accompanied by his mother.  History: Patient is a 23 year old with spastic quadriparesis, severe cognitive delays, and complex partial seizures with secondary generalization as a result of extreme prematurity with grade 4 intraventricular hemorrhage. His mother adopt him and his twin sister since 17 months old.  He is wheelchair bound, depend on PEG tube feeding, he can communicate some with facial expression, but non verbal. He has severe nonfunctional spastic qudraplegia, s/p baclofen bump placement at [facility]. Dr Gaynell Face refills his Baclofen bump, which helps his lower extremity spasticity, it is much easier for care giver to change his diaper, other wise he has frequent bilateral lower extremity spasticity, clonus, and hip pain.  He has been receiving BOTOX A injection from [facility] Dr. Haynes Kerns since 2000, q3-4 months, it has been very helpful, his upper extremity is more relaxed, it is easier to put on cloth and clean his hands after injection, last injection from faxed over note was in 09/26/2009, 300 units were injected into bilateral biceps, brachioradialis, pronator teres, ECU, FDP, mother wants to change care locally for convience.  Mother reported benefit usually lasts less than 3 months, she would then noticed increased difficulty to clean his hands, he also had some improvement with his grip with BOTOX A,   He also has bilateral forearm deep tendon release surgery for his bilateral hands finger flexions in the summer of 2014  UPDATE June 17th 2015: Last EMG guided Botox injection was in March 2015, he received 300 units of Botox a divided into right and left upper extremities. he did very well, there was no significant side effect noticed.  Mother reported that he has increased seizure activity, is taking Keppra with Vimpat, which has been very helpful, his seizure is under  the care of Dr. Gaynell Face,     Physical Exam     Head:   microcephaly, no dysmorphic features  Neurologic Exam  Mental Status: sits in wheelchair, mute, does not follow commands Cranial Nerves:  symmetric facial grimace.  Motor:  He has fixed contraction of bilateral elbow, only few upper extremity spontaneous activities, well healed bilateral forearm scar, fixed forceful finger flexion of bilateral hand.  Bilateral lower extrmity has moderate spasticity on passive movement, fixed contraction of bilateral knee joints, no spontaneous movement of bilateral lower extremity. Coordination:  cannot test Gait and Station:  wheelchair-bound    Assessement and Plan:  23 yo male with cerebral palsy, severe mental retardation, epilepsy EMG guided BOTOX A injection for bilateral upper extremity spasticity, he has Baclofen pump, which has helped his lower extremity spasticity.  300 units were used, 100 units were dissovled into 2 cc of NS.   Right biceps 25 units Right brachialis 50 units Right pronator teres 25units Right flexor carpi ulnaris 25 Right flexor digital profundus 25    Left biceps 25  units, Left brachialis 50 units Left pronator teres 25 units   Left flexor carpi ulnaris 25 Left flexor digital profundus 25  He tolerate the injection very well, will return to clinic in 3 months, for repeat injection.

## 2013-07-25 ENCOUNTER — Other Ambulatory Visit: Payer: Self-pay | Admitting: Family

## 2013-08-15 ENCOUNTER — Other Ambulatory Visit: Payer: Self-pay | Admitting: Family

## 2013-08-15 ENCOUNTER — Telehealth: Payer: Self-pay

## 2013-08-15 DIAGNOSIS — G40319 Generalized idiopathic epilepsy and epileptic syndromes, intractable, without status epilepticus: Secondary | ICD-10-CM

## 2013-08-15 DIAGNOSIS — G40219 Localization-related (focal) (partial) symptomatic epilepsy and epileptic syndromes with complex partial seizures, intractable, without status epilepticus: Secondary | ICD-10-CM

## 2013-08-15 MED ORDER — DIASTAT ACUDIAL 20 MG RE GEL: RECTAL | Status: DC

## 2013-08-15 NOTE — Telephone Encounter (Signed)
Diastat Rx sent. TG

## 2013-09-01 ENCOUNTER — Other Ambulatory Visit: Payer: Self-pay | Admitting: Family

## 2013-09-07 ENCOUNTER — Telehealth: Payer: Self-pay | Admitting: Family

## 2013-09-07 NOTE — Telephone Encounter (Signed)
There is nothing else to do.  Please call her.

## 2013-09-07 NOTE — Telephone Encounter (Addendum)
[name] called about Jeffery [name]. She is at [facility] herself having a video EEG because she has had spells where she cannot speak. She has had other work up for TIA that is normal. She said that [name] is at with [place]. She said that he has had 5 "little seizures" yesterday and sats dropped in 60's lasted up to 1 min. Today he has had 3 little ones that also dropped sats in 60's. He is going to go ahead and give him Diastat and see if that will stop them. Herman can't do the portacath so he is going to do Diastat. Mom said to call her and she will call instructions to [place]. She can be reached at [phone]. TG

## 2013-09-07 NOTE — Telephone Encounter (Signed)
Called to Mom. TG

## 2013-09-10 ENCOUNTER — Telehealth: Payer: Self-pay | Admitting: Family

## 2013-09-10 NOTE — Telephone Encounter (Signed)
[name] called about Jeffery [name]. I called her back and she said that in follow up to Friday's message about Jeffery [name] giving Diastat for repetitive seizures for sats in 60's - it worked, the seizures stopped and he slept after that. He hasn't had any more. Because of the way Jeffery [name] has to have everything documented as his caregiver, she wants an order written with parameters for "little seizure" events that drop sats into 60's or 70's like it did on Friday. For example how many and how how often should it happen before Diastat is given - after 2 event, after 3, after x number such as after in a 3 in an hour or in a day, etc. On Friday, Jeffery [name] had one desturation to 60% per hour hour x 5, and the sats stayed down despite comfort measures. That is when Diastat was given because Jeffery [name] and Jeffery [name] were nervous with him continuing to do that behavior.  When letter is ready, she said that fx note to [phone]. For questions, call ph [phone]. Mom is aware that Dr Lemmie Evens is out of the office until [facility] 09/12/13. TG

## 2013-09-12 NOTE — Telephone Encounter (Signed)
12 minutes on call.  I really can't write an order that will cover all circumstances.  I think that if he has back-to-back seizures with only minutes in between, or solitary seizures that occur once an hour x3 the Diastat would be a reasonable treatment.  My biggest problem with using desaturations as a proxy for seizures is that I am not certain that every time he desaturates it represents a seizure.  What has happened however his once he gets Diastat, whatever the behaviors are, stop.  I don't know if this relaxes him so that he breathes easily, or actually is stopping a complex partial seizure.He had a good weekend, and Monday.  Before this flurry of the saturations, he had pneumonia with high fever and had no seizures and no desaturations which is counterintuitive.I don't think that I can write an order that covers all eventualities and mother may have to call when she has questions.

## 2013-09-14 NOTE — Telephone Encounter (Signed)
[name], mom, stated that the pt does not tolerate the bath chair. The mother said he has been getting bathed in bed. The mother uses sage shampoo caps? And bath wipes, can be microwaved. The mother said she has been buying them on her own. The mother stated the [facility] can get these supplies authorized. She said that they can write a letter. The mother can be reached at [phone].

## 2013-09-17 NOTE — Telephone Encounter (Signed)
The letter has been written and will be faxed to Sarina Ser, Care Coordinator at [facility]. TG

## 2013-09-27 ENCOUNTER — Ambulatory Visit (INDEPENDENT_AMBULATORY_CARE_PROVIDER_SITE_OTHER): Payer: Medicaid Other | Admitting: Pediatrics

## 2013-09-27 ENCOUNTER — Encounter: Payer: Self-pay | Admitting: Pediatrics

## 2013-09-27 DIAGNOSIS — G40219 Localization-related (focal) (partial) symptomatic epilepsy and epileptic syndromes with complex partial seizures, intractable, without status epilepticus: Secondary | ICD-10-CM

## 2013-09-27 DIAGNOSIS — R1310 Dysphagia, unspecified: Secondary | ICD-10-CM

## 2013-09-27 DIAGNOSIS — G40319 Generalized idiopathic epilepsy and epileptic syndromes, intractable, without status epilepticus: Secondary | ICD-10-CM

## 2013-09-27 DIAGNOSIS — G808 Other cerebral palsy: Secondary | ICD-10-CM
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
## 2013-10-17 ENCOUNTER — Ambulatory Visit: Payer: Medicaid Other | Admitting: Neurology

## 2013-10-23 ENCOUNTER — Telehealth: Payer: Self-pay | Admitting: *Deleted

## 2013-10-23 DIAGNOSIS — G40219 Localization-related (focal) (partial) symptomatic epilepsy and epileptic syndromes with complex partial seizures, intractable, without status epilepticus: Secondary | ICD-10-CM

## 2013-10-23 DIAGNOSIS — G40319 Generalized idiopathic epilepsy and epileptic syndromes, intractable, without status epilepticus: Secondary | ICD-10-CM

## 2013-10-23 MED ORDER — DIASTAT ACUDIAL 20 MG RE GEL: RECTAL | Status: DC

## 2013-10-23 NOTE — Telephone Encounter (Signed)
I will refill the Diastat.  Please contact mom and let her know I tried to reach her.  I wonder about trying to increase Vimpat.  I don't see a reason to get laboratory studies though I mentioned that in my voicemail.  I would like you to schedule an appointment with her to show her how to use Versed.

## 2013-10-23 NOTE — Telephone Encounter (Signed)
[name], mom, stated the pt had 3 seizures yesterday. She said at 10:15 am, the pt had a petit mal seizure that lasted 3 seconds. The pt's head was turned to the right, eyes open-eyes moving back and forth. She said the pt was not himself and was sleepy. The pt had another seizure at 1:30 pm, lasted 55 seconds. He had one at 7:05 pm. He was on his back, face turned to the right, eyes were wide open, staring. Lasted about 30 seconds. The mother gave him diastat. She said he seemed ok today. The mother called in the last refill on the diastat. She can be reached at [phone].

## 2013-10-24 ENCOUNTER — Ambulatory Visit: Payer: Medicaid Other | Admitting: Neurology

## 2013-10-24 NOTE — Telephone Encounter (Signed)
I left a message for Mom and asked her to call back. TG

## 2013-10-24 NOTE — Telephone Encounter (Signed)
Noted, thank you

## 2013-10-24 NOTE — Telephone Encounter (Signed)
Mom called me back and said that both parents wanted to come at same time, but right now Jeffery [name] has pneumonia and is unable to travel, so she will call back to schedule time for training when Jeffery [name] is able to leave home and come with parents. I will wait to hear from her. TG

## 2013-10-24 NOTE — Telephone Encounter (Signed)
[name], mom, stated she missed Dr. Melanee Left call regarding the Versed training. She can be reached at [phone].

## 2013-10-31 ENCOUNTER — Other Ambulatory Visit: Payer: Self-pay | Admitting: Family

## 2013-11-05 ENCOUNTER — Ambulatory Visit (INDEPENDENT_AMBULATORY_CARE_PROVIDER_SITE_OTHER): Payer: Medicaid Other | Admitting: Family

## 2013-11-05 DIAGNOSIS — G808 Other cerebral palsy: Secondary | ICD-10-CM

## 2013-11-05 DIAGNOSIS — G40211 Localization-related (focal) (partial) symptomatic epilepsy and epileptic syndromes with complex partial seizures, intractable, with status epilepticus: Secondary | ICD-10-CM

## 2013-11-05 DIAGNOSIS — Z8709 Personal history of other diseases of the respiratory system: Secondary | ICD-10-CM

## 2013-11-05 DIAGNOSIS — R0981 Nasal congestion: Secondary | ICD-10-CM

## 2013-11-05 DIAGNOSIS — G40319 Generalized idiopathic epilepsy and epileptic syndromes, intractable, without status epilepticus: Secondary | ICD-10-CM

## 2013-11-05 DIAGNOSIS — G40311 Generalized idiopathic epilepsy and epileptic syndromes, intractable, with status epilepticus: Secondary | ICD-10-CM

## 2013-11-05 MED ORDER — MIDAZOLAM 5 MG/ML PEDIATRIC INJ FOR INTRANASAL/SUBLINGUAL USE: 10.0000 mg | Freq: Once | INTRAMUSCULAR | Status: AC

## 2013-11-06 ENCOUNTER — Encounter: Payer: Self-pay | Admitting: Family

## 2013-11-06 DIAGNOSIS — R0981 Nasal congestion: Secondary | ICD-10-CM | POA: Insufficient documentation

## 2013-11-06 NOTE — Patient Instructions (Addendum)
Mom was given printed instructions and seizure action plan for [place]. I will refer him to ENT for evaluation of his congested left nostril.

## 2013-11-06 NOTE — Progress Notes (Signed)
[name]'s mother, [name], came in for training to learn to give intranasal Midazolam (Versed) to [name] in the event of seizure lasting more than 2 minutes or repetitive seizures in a short period of time. I reviewed the equipment and procedure with her. She practiced drawing up the dose and administering the medication (using normal saline) and demonstrated appropriate technique using a doll. Mom said that [name] has been unable to breathe through his left nostril for a long time and that she is unable to suction that nostril, so she will administer the medication in the right nostril only. Mom will call 911 the first time Versed is administered in order for EMS to assess [name]'s condition. She will also access his Port-a-Cath in the event that he needs emergency treatment. She will also call and notify this office if Versed is administered. Mom was given a new seizure action plan and documentation of training for the community service provider. I will refer [name] to ENT for evaluation of his chronically congested left nostril. Dr Gaynell Face was consulted regarding the dosage of Versed and about the seizure action plan.

## 2013-11-08 ENCOUNTER — Other Ambulatory Visit: Payer: Self-pay | Admitting: Pediatrics

## 2013-11-09 ENCOUNTER — Telehealth: Payer: Self-pay | Admitting: Neurology

## 2013-11-09 NOTE — Telephone Encounter (Signed)
FYI -- Dr. Krista Blue Botox patient for 10/14 @ 2:30, Jeffery [name] dob [birth year] caregiver called and requested to cancel appt due to patient has severe Pnuemonia.  Requesting a return call to reschedule.  Jeffery [name] @ [phone].  thanks

## 2013-11-12 ENCOUNTER — Telehealth: Payer: Self-pay | Admitting: *Deleted

## 2013-11-12 NOTE — Telephone Encounter (Signed)
Please advise. Thanks.  

## 2013-11-12 NOTE — Telephone Encounter (Signed)
[name], mom, stated that she had no idea about an appointment the pt had. The mother said the appoinment was made without her knowledge. She said she didn't know where to go. The mother said the pt could not go anywhere because he has pneumonia. I called and left a message at 3:02 pm to get more details. The mother can be reached at [phone].

## 2013-11-13 ENCOUNTER — Other Ambulatory Visit: Payer: Self-pay | Admitting: Family

## 2013-11-13 MED ORDER — VIMPAT 10 MG/ML PO SOLN: ORAL | Status: DC

## 2013-11-13 NOTE — Telephone Encounter (Signed)
Mom called and said that she was having difficulty getting the Vimpat [facility] filled at [facility] and wanted to change pharmacy to [facility] on [address] in [place]. I faxed the Vimpat Rx there. TG

## 2013-11-14 ENCOUNTER — Ambulatory Visit: Payer: Medicaid Other | Admitting: Neurology

## 2013-11-20 ENCOUNTER — Ambulatory Visit (INDEPENDENT_AMBULATORY_CARE_PROVIDER_SITE_OTHER): Payer: Medicaid Other | Admitting: Family

## 2013-11-20 ENCOUNTER — Encounter: Payer: Self-pay | Admitting: Family

## 2013-11-20 DIAGNOSIS — G40319 Generalized idiopathic epilepsy and epileptic syndromes, intractable, without status epilepticus: Secondary | ICD-10-CM

## 2013-11-20 DIAGNOSIS — G40311 Generalized idiopathic epilepsy and epileptic syndromes, intractable, with status epilepticus: Secondary | ICD-10-CM

## 2013-11-20 DIAGNOSIS — G40211 Localization-related (focal) (partial) symptomatic epilepsy and epileptic syndromes with complex partial seizures, intractable, with status epilepticus: Secondary | ICD-10-CM

## 2013-11-20 NOTE — Progress Notes (Signed)
[name]'s father, Evonnie Dawes, came in for training to learn to give intranasal Midazolam (Versed) to [place] in the event of seizure lasting more than 2 minutes or repetitive seizures in a short period of time. I reviewed the equipment and procedure with him. He practiced drawing up the dose and administering the medication (using normal saline) and demonstrated appropriate technique using a doll. Mom had told me in the past that [name] has been unable to breathe through his left nostril for a long time and that they are unable to suction that nostril, so parents will administer the medication in the right nostril only. Dad will call 911 the first time Versed is administered in order for EMS to assess [name]'s condition. If Mom is available, she will also access his Port-a-Cath in the event that he needs emergency treatment. Dad will call and notify this office if Versed is administered. Dad had opportunity for question and answer regarding the procedure.

## 2013-11-20 NOTE — Patient Instructions (Signed)
Dad was given printed instructions and documentation of today's training for intranasal Versed.

## 2013-11-28 ENCOUNTER — Encounter: Payer: Self-pay | Admitting: Neurology

## 2013-12-05 ENCOUNTER — Telehealth: Payer: Self-pay | Admitting: Neurology

## 2013-12-05 NOTE — Telephone Encounter (Signed)
Congenital spastic qudraplegia  Code: G80.8

## 2013-12-05 NOTE — Telephone Encounter (Signed)
Code: G80.8

## 2013-12-10 ENCOUNTER — Telehealth: Payer: Self-pay

## 2013-12-10 NOTE — Telephone Encounter (Signed)
[name], mom, lvm stating that patient takes Vimpat 10 mg/mL Sol 5 mLs po BID. She said that [facility] is only filling the Rx for 300 mLs. They are not taking into account the months where there are 31 days. The Rx cannot be refilled at the [facility] again until 12/14/13. She has enough to give "[name]" his morning dose tomorrow, 12/11/13, but then he will be completely out. Before switching to [facility], [name] said that [facility] was using [facility]. [facility] but he would be completely out on the day the Rx was refilled.  I called [name] and informed her that Otila Kluver is out of the office today. She expressed understanding and said that it can wait until Otila Kluver gets in tomorrow. [name] can be reached at [phone].

## 2013-12-11 NOTE — Telephone Encounter (Signed)
I called and talked to Mom. The pharmacy is filling a 30 day supply. She is measuring the dose with a syringe. I will see if I can get Medicaid to approve a larger quantity - they used to approve a 34 day supply but I do not know if they will do this as Vimpat is considered a controlled drug. I will call Mom back when I have an answer. TG

## 2013-12-12 ENCOUNTER — Encounter: Payer: Self-pay | Admitting: Neurology

## 2013-12-12 ENCOUNTER — Ambulatory Visit (INDEPENDENT_AMBULATORY_CARE_PROVIDER_SITE_OTHER): Payer: Medicaid Other | Admitting: Neurology

## 2013-12-12 VITALS — Wt 128.0 lb

## 2013-12-12 DIAGNOSIS — G8250 Quadriplegia, unspecified: Secondary | ICD-10-CM

## 2013-12-12 DIAGNOSIS — G40319 Generalized idiopathic epilepsy and epileptic syndromes, intractable, without status epilepticus: Secondary | ICD-10-CM

## 2013-12-12 DIAGNOSIS — G808 Other cerebral palsy: Secondary | ICD-10-CM

## 2013-12-12 MED ORDER — ONABOTULINUMTOXINA 100 UNITS IJ SOLR
300.0000 [IU] | Freq: Once | INTRAMUSCULAR | Status: AC
  Administered 2013-12-12: 300 [IU] via INTRAMUSCULAR

## 2013-12-12 NOTE — Telephone Encounter (Signed)
I confirmed with Medicaid that the Rx has a PA in place. I called the pharmacy and told them that Medicaid will pay for 34 day supply and to fill Rx for 326ml. They agreed to do so. I called Mom and let her know that she should be able to pick up the Rx today. TG

## 2013-12-12 NOTE — Telephone Encounter (Signed)
When talking with Jeffery [name] today about Jeffery [name]'s medication, she asked about the referral to ENT. She said that she realized that she had missed it while Jeffery [name] was so sick last month. She said that she would call the ENT office and ask to reschedule the appointment. TG

## 2013-12-12 NOTE — Progress Notes (Signed)
HPI:   Achille came in for EMG guided BOTOX injection for bilateral upper extremity spasticity, accompanied by his mother.  History: Patient is a 23 year old with spastic quadriparesis, severe cognitive delays, and complex partial seizures with secondary generalization as a result of extreme prematurity with grade 4 intraventricular hemorrhage. His mother adopt him and his twin sister since 98 months old.  He is wheelchair bound, depend on PEG tube feeding, he can communicate some with facial expression, but non verbal. He has severe nonfunctional spastic qudraplegia, s/p baclofen bump placement at [facility]. Dr Gaynell Face refills his Baclofen bump, which helps his lower extremity spasticity, it is much easier for care giver to change his diaper, other wise he has frequent bilateral lower extremity spasticity, clonus, and hip pain.  He has been receiving BOTOX A injection from [facility] Dr. Haynes Kerns since 2000, every 3-4 months, it has been very helpful, his upper extremity is more relaxed, it is easier to put on cloth and clean his hands after injection, last injection by Dr. Haynes Kerns was in 09/26/2009, 300 units were injected into bilateral biceps, brachioradialis, pronator teres, ECU, FDP, mother wants to change care locally for convience.  Mother reported benefit usually lasts less than 3 months, she would then noticed increased difficulty to clean his hands, he also had some improvement with his grip with BOTOX A,   He also has bilateral forearm deep tendon release surgery for his bilateral hands finger flexions in the summer of 2014  UPDATE Nov 11th 2015: Last EMG guided Botox injection was in June 2015, he received 300 units of Botox a divided into right and left upper extremities. he did very well, there was no significant side effect noticed.  Mother reported that he has increased seizure activity, is taking Keppra with Vimpat, which has been very helpful, his seizure is under the  care of Dr. 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  Motor:  He has fixed contraction of bilateral elbow, only few upper extremity spontaneous activities, well healed bilateral forearm scar, fixed forceful finger flexion of bilateral hand.  Bilateral lower extrmity has moderate spasticity on passive movement, fixed contraction of bilateral knee joints, no spontaneous movement of bilateral lower extremity. Coordination:  cannot test Gait and Station:  wheelchair-bound    Assessement and Plan:  23 yo male with cerebral palsy, severe mental retardation, epilepsy EMG guided BOTOX A injection for bilateral upper extremity spasticity, he has Baclofen pump, which has helped his lower extremity spasticity.  300 units were used, 100 units were dissovled into 2 cc of NS.   Right biceps 25 units Right brachialis 25 units Right pronator teres 25units Right flexor carpi ulnaris 25 Right flexor digitorum profundus 25 Right flexor digitorum superficialis 25    Left biceps 25  units, Left brachialis 25 units Left pronator teres 25 units   Left flexor carpi ulnaris 25 Left flexor digital profundus 25 Left flexor digitorum superficialis 25 units  He tolerate the injection very well, will return to clinic in 3 months, for repeat injection.

## 2013-12-13 ENCOUNTER — Telehealth: Payer: Self-pay | Admitting: Family

## 2013-12-13 NOTE — Telephone Encounter (Signed)
I reviewed your note and agree.  I have the ENT eval that I will scan into the chart.

## 2013-12-13 NOTE — Telephone Encounter (Signed)
[name] called and left a message saying that Jeffery [name] was seen by ENT today, who said that he has deviated septum. He said that Jeffery [name] was not a surgical candidate (which Mom knew) but did give her some pediatric suction catheters to use that worked better when she has to suction his nose on the affected side. Mom asked if the intranasal Versed order will stay the same to only give the medication in his "good side" since the ENT said that he was moving some air on the affected side but that it was restricted. I called Mom back and told her yes, that we will keep the order for intranasal Versed the same for now. Mom can be reached at [phone]. TG

## 2013-12-17 ENCOUNTER — Telehealth: Payer: Self-pay | Admitting: Family

## 2013-12-17 DIAGNOSIS — M25559 Pain in unspecified hip: Secondary | ICD-10-CM

## 2013-12-17 NOTE — Telephone Encounter (Signed)
[name] left message about Jeffery [name].  She said he is having a lot of right hip pain - she said the right femoral head rubs and causes pain because he has hx bilateral hip dysplasia & is not a surgical candidate that is why he was put on Neurontin for hip pain + it treats seizures.  Mom talked to Dr Jobe Igo & discussed Lyrica but he won't do it along with Neurontin - wants Dr Gaynell Face to direct because of the Neurontin.  Tylenol doesn't help. He has Tylenol with Codeine from a prior sugery but Dr Jobe Igo doesn't want him on Codeine. Mom asks what you think about increasing Neurontin dose, changing to Lyrica, adding Lyrica or other options that you would recommend. Mom can be recahed at [phone]. I called Mom and let her know that Dr Gaynell Face is out of the office today, and that I was not comfortable changing Jeffery [name]'s medication without discussion with Dr Gaynell Face. Mom agreed and is ok to wait until Dr Gaynell Face returns tomorrow. TG

## 2013-12-18 MED ORDER — GABAPENTIN 250 MG/5ML PO SOLN: ORAL | Status: DC

## 2013-12-18 NOTE — Telephone Encounter (Signed)
I don't think that any treatment exists for this condition.  We will increase Neurontin to 18 mL 3 times daily.  This will increase his dose from 800 to 900 mg 3 times daily.  Lyrica will make him extremely sleepy.

## 2013-12-25 ENCOUNTER — Encounter: Payer: Self-pay | Admitting: Pediatrics

## 2013-12-25 ENCOUNTER — Ambulatory Visit (INDEPENDENT_AMBULATORY_CARE_PROVIDER_SITE_OTHER): Payer: Medicaid Other | Admitting: Pediatrics

## 2013-12-25 DIAGNOSIS — G808 Other cerebral palsy: Secondary | ICD-10-CM

## 2013-12-25 DIAGNOSIS — R1310 Dysphagia, unspecified: Secondary | ICD-10-CM

## 2013-12-25 DIAGNOSIS — G40309 Generalized idiopathic epilepsy and epileptic syndromes, not intractable, without status epilepticus: Secondary | ICD-10-CM
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
## 2014-01-09 ENCOUNTER — Telehealth: Payer: Self-pay | Admitting: Family

## 2014-01-09 NOTE — Telephone Encounter (Signed)
[name] left message about Jeffery [name]. She said that he had a seizure at 11 PM last night. Mom said that in the beginning of the seizure, he looked like he was having a stroke. He had another at 4 AM and dropped sats to 50's. Mom didn't give him rescue medication. She wants to talk about the seizures and can be reached at [phone]. TG

## 2014-01-09 NOTE — Telephone Encounter (Signed)
11 minute phone call.  One episode was associated with drooping of his face.  I'm not certain that that was truly a stroke.  He was not wearing his BiPAP.  I strongly urged that the BiPAP be placed on him, but if they don't wait until he falls asleep, he becomes agitated and won't wear it.  Mother asked if she could give the Diastat if he was in bed as opposed of Versed.  I think that is a good plan.  She also would prefer to give the Diastat if they're on a car trip traveling in remote areas and I think that that's a reasonable idea as well.  She is going to write up the orders to reflect those issues.  I don't think that he needs change in his medication.  There often is a correlation in children who are fragile between their level of oxygenation and recurrent seizures.

## 2014-01-11 ENCOUNTER — Other Ambulatory Visit: Payer: Self-pay | Admitting: Family

## 2014-01-14 NOTE — Telephone Encounter (Addendum)
I think that this should be one or the other in a 24-hour period.  If seizures continue, he needs to be brought to the emergency department where oxygenation can be monitored and administered.

## 2014-01-14 NOTE — Telephone Encounter (Addendum)
[name] left a message at 5:30pm on Friday  Dec 11th that I received today. She said that the wanted to clarify Diastat order. She gave it once on the day last week that he had seizures.The Diastat is supposed to be give only once in 24 hrs because the nasal Versed is once in 24 hours and the discussion was that he should not receive 2 doses of either medication in 24 hours. That was a direction change when the nasal Versed was ordered. Do you want to change that to once in 12 hours or something different? She need it clarified because as his caregiver, she has to give meds according to written seizure action plan. [name] can be reached at [phone]. TG

## 2014-01-14 NOTE — Telephone Encounter (Signed)
Mom called back and left alternate number of [phone] for me to call her. I called her and gave her Dr Melanee Left instructions. TG

## 2014-01-14 NOTE — Telephone Encounter (Signed)
I left a message and asked Mom to call back. TG

## 2014-02-28 ENCOUNTER — Telehealth: Payer: Self-pay | Admitting: *Deleted

## 2014-02-28 NOTE — Telephone Encounter (Addendum)
[name], mother, stated she want to give you an update on the pt. She said that pulmonary-wise, he is doing well. She said the pt has been having "clinical seizures" in his sleep in the past week to ten days. She is concerned about the timing of the seizures, which happen mostly in the evening, after 7 pm.The mother can be reached at [phone]. The mother also mentioned that the pt's doctor ordered lab work today. The pt will be going to get labs done on Monday. He is getting CBC, BMP and Calcium ordered. If Dr. Gaynell Face needs to add to the lab order, he can call [facility].

## 2014-02-28 NOTE — Telephone Encounter (Signed)
I called Mom. She said that Jeffery [name] has been doing well overall, no infections or need for antibiotics since November. Mom said for the past week, between 7pm and 11pm, he has been having frequent seizures. She said that sometimes he was awake, sometimes he was asleep when they occurred. Mom said that they were subtle, but she feels that the events must be neuro related and must be seizures. She said that his color changes, his sats drop to 40's, and he requires 100% O2 and stimulation for sats to increase. She said that he was unresponsive one morning for awhile but that she didn't see any seizure activity - he just would not awaken. She finally put him in his vest and he began to wake up. She said that he is scheduled for blood draw by [facility] on Monday if you want to order anything. Contact Lisa Apple at [facility] to give lab orders. Mom can be reached at [phone] to discuss the seizures. TG

## 2014-02-28 NOTE — Telephone Encounter (Signed)
I am not at all convinced that these episodes represent seizures.  Mother is not seeing any nystagmus any posturing any rhythmic jerking.  She states that her son is sleep and is hard to wake him up.  I think that this is probably related to his sleep apnea may be airway issues.  The fact that she put the vest on him and it began to wake him up and may also have shifted up fluids within his lungs suggest to me that were dealing with a nonepileptic neuro-problem, or pulmonary problem.  Not going to obtain laboratory studies for his any other drugs because they're not useful clinically.  I would like to perform a prolonged ambulatory EEG when it is available which may not be until mid March.

## 2014-03-01 ENCOUNTER — Emergency Department (HOSPITAL_BASED_OUTPATIENT_CLINIC_OR_DEPARTMENT_OTHER): Payer: Medicaid Other

## 2014-03-01 ENCOUNTER — Inpatient Hospital Stay (HOSPITAL_BASED_OUTPATIENT_CLINIC_OR_DEPARTMENT_OTHER)
Admission: EM | Admit: 2014-03-01 | Discharge: 2014-03-04 | DRG: 808 | Disposition: A | Discharge status: Home / Self Care | Payer: Medicaid Other | Attending: Internal Medicine | Admitting: Internal Medicine

## 2014-03-01 ENCOUNTER — Encounter (HOSPITAL_BASED_OUTPATIENT_CLINIC_OR_DEPARTMENT_OTHER): Payer: Self-pay

## 2014-03-01 DIAGNOSIS — Z87442 Personal history of urinary calculi: Secondary | ICD-10-CM | POA: Diagnosis not present

## 2014-03-01 DIAGNOSIS — R1310 Dysphagia, unspecified: Secondary | ICD-10-CM | POA: Diagnosis present

## 2014-03-01 DIAGNOSIS — D709 Neutropenia, unspecified: Secondary | ICD-10-CM | POA: Diagnosis not present

## 2014-03-01 DIAGNOSIS — G800 Spastic quadriplegic cerebral palsy: Secondary | ICD-10-CM | POA: Diagnosis present

## 2014-03-01 DIAGNOSIS — Z7401 Bed confinement status: Secondary | ICD-10-CM

## 2014-03-01 DIAGNOSIS — Z888 Allergy status to other drugs, medicaments and biological substances status: Secondary | ICD-10-CM

## 2014-03-01 DIAGNOSIS — M810 Age-related osteoporosis without current pathological fracture: Secondary | ICD-10-CM | POA: Diagnosis present

## 2014-03-01 DIAGNOSIS — J690 Pneumonitis due to inhalation of food and vomit: Secondary | ICD-10-CM | POA: Diagnosis present

## 2014-03-01 DIAGNOSIS — D7589 Other specified diseases of blood and blood-forming organs: Secondary | ICD-10-CM | POA: Diagnosis present

## 2014-03-01 DIAGNOSIS — Z931 Gastrostomy status: Secondary | ICD-10-CM | POA: Diagnosis not present

## 2014-03-01 DIAGNOSIS — G808 Other cerebral palsy: Secondary | ICD-10-CM | POA: Diagnosis present

## 2014-03-01 DIAGNOSIS — K529 Noninfective gastroenteritis and colitis, unspecified: Secondary | ICD-10-CM | POA: Diagnosis present

## 2014-03-01 DIAGNOSIS — G40201 Localization-related (focal) (partial) symptomatic epilepsy and epileptic syndromes with complex partial seizures, not intractable, with status epilepticus: Secondary | ICD-10-CM | POA: Diagnosis present

## 2014-03-01 DIAGNOSIS — Z993 Dependence on wheelchair: Secondary | ICD-10-CM | POA: Diagnosis not present

## 2014-03-01 DIAGNOSIS — G4733 Obstructive sleep apnea (adult) (pediatric): Secondary | ICD-10-CM | POA: Diagnosis present

## 2014-03-01 DIAGNOSIS — G709 Myoneural disorder, unspecified: Secondary | ICD-10-CM | POA: Diagnosis present

## 2014-03-01 DIAGNOSIS — H35109 Retinopathy of prematurity, unspecified, unspecified eye: Secondary | ICD-10-CM | POA: Diagnosis present

## 2014-03-01 DIAGNOSIS — M4140 Neuromuscular scoliosis, site unspecified: Secondary | ICD-10-CM | POA: Diagnosis present

## 2014-03-01 DIAGNOSIS — Z8673 Personal history of transient ischemic attack (TIA), and cerebral infarction without residual deficits: Secondary | ICD-10-CM | POA: Diagnosis not present

## 2014-03-01 DIAGNOSIS — D649 Anemia, unspecified: Secondary | ICD-10-CM | POA: Diagnosis not present

## 2014-03-01 DIAGNOSIS — G40219 Localization-related (focal) (partial) symptomatic epilepsy and epileptic syndromes with complex partial seizures, intractable, without status epilepticus: Secondary | ICD-10-CM | POA: Diagnosis present

## 2014-03-01 DIAGNOSIS — D61818 Other pancytopenia: Secondary | ICD-10-CM | POA: Diagnosis present

## 2014-03-01 DIAGNOSIS — R5383 Other fatigue: Secondary | ICD-10-CM | POA: Diagnosis present

## 2014-03-01 DIAGNOSIS — D72819 Decreased white blood cell count, unspecified: Secondary | ICD-10-CM | POA: Diagnosis not present

## 2014-03-01 DIAGNOSIS — Z881 Allergy status to other antibiotic agents status: Secondary | ICD-10-CM | POA: Diagnosis not present

## 2014-03-01 LAB — I-STAT VENOUS BLOOD GAS, ED
Acid-Base Excess: 5 mmol/L — ABNORMAL HIGH (ref 0.0–2.0)
Bicarbonate: 30.5 mEq/L — ABNORMAL HIGH (ref 20.0–24.0)
O2 Saturation: 82 %
PCO2 VEN: 48.2 mmHg (ref 45.0–50.0)
Patient temperature: 98.8
TCO2: 32 mmol/L (ref 0–100)
pH, Ven: 7.41 — ABNORMAL HIGH (ref 7.250–7.300)
pO2, Ven: 47 mmHg — ABNORMAL HIGH (ref 30.0–45.0)

## 2014-03-01 LAB — I-STAT CG4 LACTIC ACID, ED: Lactic Acid, Venous: 2.66 mmol/L (ref 0.5–2.0)

## 2014-03-01 LAB — CBC WITH DIFFERENTIAL/PLATELET
BASOS ABS: 0 10*3/uL (ref 0.0–0.1)
BASOS PCT: 0 % (ref 0–1)
EOS ABS: 0 10*3/uL (ref 0.0–0.7)
Eosinophils Relative: 3 % (ref 0–5)
HCT: 16.2 % — ABNORMAL LOW (ref 39.0–52.0)
Hemoglobin: 4.7 g/dL — CL (ref 13.0–17.0)
LYMPHS ABS: 0.5 10*3/uL — AB (ref 0.7–4.0)
LYMPHS PCT: 47 % — AB (ref 12–46)
MCH: 30.1 pg (ref 26.0–34.0)
MCHC: 29 g/dL — AB (ref 30.0–36.0)
MCV: 103.8 fL — ABNORMAL HIGH (ref 78.0–100.0)
Monocytes Absolute: 0.3 10*3/uL (ref 0.1–1.0)
Monocytes Relative: 34 % — ABNORMAL HIGH (ref 3–12)
Neutro Abs: 0.1 10*3/uL — ABNORMAL LOW (ref 1.7–7.7)
Neutrophils Relative %: 16 % — ABNORMAL LOW (ref 43–77)
Platelets: 101 10*3/uL — ABNORMAL LOW (ref 150–400)
RBC: 1.56 MIL/uL — AB (ref 4.22–5.81)
RDW: 16.4 % — ABNORMAL HIGH (ref 11.5–15.5)
WBC: 0.9 10*3/uL — CL (ref 4.0–10.5)

## 2014-03-01 LAB — DIC (DISSEMINATED INTRAVASCULAR COAGULATION)PANEL
Fibrinogen: 447 mg/dL (ref 204–475)
INR: 1.07 (ref 0.00–1.49)
Prothrombin Time: 13.9 seconds (ref 11.6–15.2)
Smear Review: NONE SEEN
aPTT: 34 seconds (ref 24–37)

## 2014-03-01 LAB — COMPREHENSIVE METABOLIC PANEL
ALBUMIN: 3.9 g/dL (ref 3.5–5.2)
ALT: 39 U/L (ref 0–53)
ANION GAP: 6 (ref 5–15)
AST: 26 U/L (ref 0–37)
Alkaline Phosphatase: 141 U/L — ABNORMAL HIGH (ref 39–117)
BUN: 21 mg/dL (ref 6–23)
CHLORIDE: 101 mmol/L (ref 96–112)
CO2: 31 mmol/L (ref 19–32)
Calcium: 9 mg/dL (ref 8.4–10.5)
Creatinine, Ser: 0.48 mg/dL — ABNORMAL LOW (ref 0.50–1.35)
GFR calc Af Amer: >90 mL/min (ref >90)
GFR calc non Af Amer: >90 mL/min (ref >90)
Glucose, Bld: 122 mg/dL — ABNORMAL HIGH (ref 70–99)
Potassium: 3.7 mmol/L (ref 3.5–5.1)
Sodium: 138 mmol/L (ref 135–145)
Total Bilirubin: 0.8 mg/dL (ref 0.3–1.2)
Total Protein: 7.2 g/dL (ref 6.0–8.3)

## 2014-03-01 LAB — RETICULOCYTES
RBC.: 1.56 MIL/uL — AB (ref 4.22–5.81)
RETIC COUNT ABSOLUTE: 14 10*3/uL — AB (ref 19.0–186.0)
RETIC CT PCT: 0.9 % (ref 0.4–3.1)

## 2014-03-01 LAB — DIC (DISSEMINATED INTRAVASCULAR COAGULATION) PANEL: PLATELETS: 86 10*3/uL — AB (ref 150–400)

## 2014-03-01 MED ORDER — DEXTROSE 5 % IV SOLN
2.0000 g | Freq: Once | INTRAVENOUS | Status: DC
Start: 2014-03-01 — End: 2014-03-02

## 2014-03-01 MED ORDER — SODIUM CHLORIDE 0.9 % IV BOLUS (SEPSIS)
1000.0000 mL | Freq: Once | INTRAVENOUS | Status: AC
  Administered 2014-03-01: 1000 mL via INTRAVENOUS

## 2014-03-01 MED ORDER — VANCOMYCIN HCL IN DEXTROSE 1-5 GM/200ML-% IV SOLN
1000.0000 mg | Freq: Once | INTRAVENOUS | Status: AC
  Administered 2014-03-01: 1000 mg via INTRAVENOUS
  Filled 2014-03-01: qty 200

## 2014-03-01 MED ORDER — SODIUM CHLORIDE 0.9 % IV SOLN
INTRAVENOUS | Status: AC
  Administered 2014-03-01: 1000 mL via INTRAVENOUS

## 2014-03-01 NOTE — ED Provider Notes (Signed)
CSN: [id number]     Arrival date & time 03/01/14  6503 History  This chart was scribed for Ezequiel Essex, MD by Chester Holstein, ED Scribe. This patient was seen in room MH01/MH01 and the patient's care was started at 7:43 PM.    Chief Complaint  Patient presents with  . Follow-up    The history is provided by a parent. No language interpreter was used.  LEVEL 5 CAVEAT HPI Comments: Jeffery [name] is a 24 y.o. male with PMHx of CP, epilepsy, osteoporosis, IVH, neuromuscular scoliosis, sinus bradycardia, kidney stones, sleep apnea, strabismus, dysphagia, hip dislocation, retinopathy of prematurity, who presents to the Emergency Department for a follow-up. Pt's guardian states labs were done by home health nurse at 3:00 PM today with Dr. Jobe Igo consulted due to pt being fatigued with notable pallor for about a week.  Lab results were abnormal (HGB of 5) and there is concern for possible error. Pt is on Bi-PAP at night. Pt was given an albuterol treatment PTA. Pt is on Vimpat, Keppra, and Neurontin. Guardian notes seizures in sleep, states usually petit mal seizures. Guardian notes 10 months since last grand-mal seizure. Pt has cough at baseline.  Pt is completely wheelchair bound. Pt is not on any blood thinners and no recent falls. Guardian denies fever, vomiting, hematuria, melena, and hematochezia.  Past Medical History  Diagnosis Date  . CP (cerebral palsy), spastic, quadriplegic   . Epilepsy   . Osteoporosis   . Inguinal hernia   . Undescended testes   . Seasonal allergies   . IVH (intraventricular hemorrhage)     Grade IV  . Hip dislocation, bilateral   . Seizures   . Hx: UTI (urinary tract infection)   . Dysphagia   . Otitis media   . Retinopathy of prematurity   . 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 Neuromuscular scoliosis   . Osteoporosis   . Complex partial seizures   . Generalized convulsive epilepsy without mention of intractable epilepsy   . Epilepsy   . Sinus  bradycardia     HR drops to 38-40 while sleeping  . Sleep apnea     BiPAP  . Blister of right heel     fluid filled; origin unknown  . Kidney stones     ?  Marland Kitchen Pneumonia      chronic pneumonia ,respitory failure dx Augest 2014   Past Surgical History  Procedure Laterality Date  . Excision of moles    . Peg placement      With multiple changes  . Gastrostomy tube, change / reposition  12/15/2010       . Button change  12/15/2010    Procedure: BUTTON CHANGE;  Surgeon: Gatha Mayer, MD;  Location: [facility];  Service: Endoscopy;  Laterality: N/A;  . Peg placement  10/07/2011    Procedure: PERCUTANEOUS ENDOSCOPIC GASTROSTOMY (PEG) REPLACEMENT;  Surgeon: Lafayette Dragon, MD;  Location: [facility];  Service: Endoscopy;  Laterality: N/A;  Needs 18 F 2.5 button ordered-dl  . Inguinal hernia repair Bilateral 1992  . Retinopathy of prematurity surgery  1992  . Achilles tendon lengthening  12/1998  . Soft tissue releases  wrists and fingers  12/1998  . Intrathecal baclofen pump placement  07/25/2000  . Peg placement N/A 06/05/2012    Procedure: PERCUTANEOUS ENDOSCOPIC GASTROSTOMY (PEG) REPLACEMENT;  Surgeon: Lafayette Dragon, MD;  Location: [facility];  Service: Endoscopy;  Laterality: N/A;  button 68fr.2.5cm  . Eye surgery Bilateral  Retinal   . Back surgery      Harrington Rods in back needs to be log rolled  . Tendon repair Bilateral 09/05/2012    Procedure: LENGTHENING OF DIGITAL FLEXOR TENDONS BILTERAL HANDS;  Surgeon: Jolyn Nap, MD;  Location: [place];  Service: Orthopedics;  Laterality: Bilateral;  . Hand surgery Bilateral Aug. 2014  . Peg placement N/A 09/13/2012    Procedure: PERCUTANEOUS ENDOSCOPIC GASTROSTOMY (PEG) REPLACEMENT;  Surgeon: Lafayette Dragon, MD;  Location: [facility];  Service: Endoscopy;  Laterality: N/A;  . Flexible sigmoidoscopy N/A 10/30/2012    Procedure: FLEXIBLE SIGMOIDOSCOPY;  Surgeon: Cleotis Nipper, MD;  Location: [facility];  Service: Endoscopy;   Laterality: N/A;  . Peg placement N/A 11/15/2012    Procedure: PERCUTANEOUS ENDOSCOPIC GASTROSTOMY (PEG) REPLACEMENT;  Surgeon: Jeryl Columbia, MD;  Location: [facility];  Service: Endoscopy;  Laterality: N/A;   Family History  Problem Relation Age of Onset  . Adopted: Yes   History  Substance Use Topics  . Smoking status: Never Smoker   . Smokeless tobacco: Never Used  . Alcohol Use: No    Review of Systems  Unable to perform ROS: Patient nonverbal      Allergies  Adhesive; Depakote; and Amoxicillin-pot clavulanate  Home Medications   Prior to Admission medications   Medication Sig Start Date End Date Taking? Authorizing Provider  albuterol (PROVENTIL HFA;VENTOLIN HFA) 108 (90 BASE) MCG/ACT inhaler Inhale 2 puffs into the lungs every 4 (four) hours as needed for shortness of breath.   Yes Historical Provider, MD  albuterol (PROVENTIL) (2.5 MG/3ML) 0.083% nebulizer solution Take 2.5 mg by nebulization. 1 ampule twice daily and as needed (making 4 doses daily) 10/11/12  Yes Elsie Stain, MD  Ascorbic Acid (VITAMIN C) 500 MG/5ML LIQD 300 mg by PEG Tube route at bedtime.    Yes Historical Provider, MD  baclofen (GABLOFEN) 40000 MCG/20ML SOLN 385.2 mcg by Intrathecal route continuous.  05/04/12  Yes Jodi Geralds, MD  Botulinum Toxin Type A, Cosm, (BOTOX COSMETIC) 50 UNITS SOLR Inject 300 Units into the muscle.   Yes Historical Provider, MD  calcium carbonate, dosed in mg elemental calcium, 1250 MG/5ML 1,000 mg of elemental calcium by PEG Tube route 2 (two) times daily. 4 cc via peg tube - ON HOLD UNTIL 11/17/12 PER DR. [name]   Yes Historical Provider, MD  diazepam (DIASTAT ACUDIAL) 10 MG GEL Place rectally once.   Yes Historical Provider, MD  dicyclomine (BENTYL) 10 MG/5ML syrup Place into feeding tube every 8 (eight) hours as needed. Not to exceed 5 doses in 1 wk   Yes Historical Provider, MD  diphenoxylate-atropine (LOMOTIL) 2.5-0.025 MG/5ML liquid Place into feeding tube 4  (four) times daily as needed for diarrhea or loose stools. 1-2 tsp via feeding tube q6h prn loose stools   Yes Historical Provider, MD  docusate (COLACE) 50 MG/5ML liquid Take 50 mg by mouth daily as needed for mild constipation.   Yes Historical Provider, MD  folic acid (FOLVITE) 1 MG tablet Take 1 mg by mouth daily.   Yes Historical Provider, MD  furosemide (LASIX) 10 MG/ML solution Place 20 mg into feeding tube daily as needed.    Yes Historical Provider, MD  gabapentin (NEURONTIN) 250 MG/5ML solution Take 18 mL 3 times daily 12/18/13  Yes Jodi Geralds, MD  KEPPRA 100 MG/ML solution TAKE 17.5 ML'S BY MOUTH TWICE A DAY 01/14/14  Yes Rockwell Germany, NP  lansoprazole (PREVACID SOLUTAB) 30 MG disintegrating tablet Take  30 mg by mouth 2 (two) times daily.  09/07/12  Yes Lafayette Dragon, MD  midazolam (VERSED) 5 MG/ML injection Place 2 mLs (10 mg total) into the nose once. Draw up 71ml in 2 syringes. Remove blue vial access device. Attach syringe to nasal atomizer for intranasal administration. Give 2ml in right nostril x 2 for seizures lasting 2 minutes or longer or for repetitive seizures in a short period of time. 11/05/13  Yes Rockwell Germany, NP  Multiple Vitamin (MULTIVITAMIN) LIQD 10 mLs by PEG Tube route daily.   Yes Historical Provider, MD  Multiple Vitamins-Minerals (ZINC PO) 5 mLs by PEG Tube route daily. 244 mg / 5 mL zinc solution   Yes Historical Provider, MD  mupirocin ointment (BACTROBAN) 2 % Apply 1 application topically as needed (to G-T site). 09/07/12  Yes Lafayette Dragon, MD  Nutritional Supplements (PROMOD) LIQD Give 30 mLs by tube 2 (two) times daily.   Yes Historical Provider, MD  Nutritional Supplements (TWOCAL HN) LIQD Take 237 mLs by mouth. T.3 can at 46 cc hour x 12 hours - water 172 cc pre and post each and extra 172 bid.   Yes Historical Provider, MD  nystatin cream (MYCOSTATIN) Apply 0,539,767 application topically. 3-4 times daily 11/29/12  Yes Samella Parr, NP   OnabotulinumtoxinA (BOTOX IM) Inject 1 mL into the muscle every 3 (three) months. Last taken on 10-11-12   Yes Historical Provider, MD  OVER THE COUNTER MEDICATION Apply 1 application topically as needed (after diaper changes). Balmex and Bag Balm paste made and applied after diaper changes   Yes Historical Provider, MD  OXYGEN-HELIUM IN Inhale into the lungs. Oxygen PRN to keep O2 Sat at 90%   Yes Historical Provider, MD  polyethylene glycol (MIRALAX / GLYCOLAX) packet Take 17 g by mouth daily.   Yes Historical Provider, MD  Pseudoephedrine HCl (SUDAFED CHILDRENS) 15 MG/5ML LIQD Give 30 mLs by tube every 6 (six) hours.    Yes Historical Provider, MD  simethicone (MYLICON) 341 MG chewable tablet Chew 125 mg by mouth every 6 (six) hours as needed for flatulence.   Yes Historical Provider, MD  sodium phosphate (FLEET) enema Place 1 enema rectally. follow package directions PRN   Yes Historical Provider, MD  VIMPAT 10 MG/ML SOLN Give 19ml by mouth twice per day 11/13/13  Yes Rockwell Germany, NP  Water For Irrigation, Sterile (FREE WATER) SOLN Place 172 mLs into feeding tube 4 (four) times daily.   Yes Historical Provider, MD  zinc oxide (BALMEX) 11.3 % CREA cream Apply 1 application topically 5 (five) times daily. With each diaper change.   Yes Historical Provider, MD   BP 112/62 mmHg  Pulse 116  Temp(Src) 98.1 F (36.7 C) (Oral)  Resp 20  SpO2 100% Physical Exam  Constitutional: He appears well-developed and well-nourished. No distress.  Appears pale, sleepy but arousable. Does not follow commands.  HENT:  Head: Normocephalic and atraumatic.  Mouth/Throat: Oropharynx is clear and moist. No oropharyngeal exudate.  Eyes: Conjunctivae and EOM are normal. Pupils are equal, round, and reactive to light.  Pale conjunctiva  Neck: Normal range of motion. Neck supple.  No meningismus.  Cardiovascular: Regular rhythm, normal heart sounds and intact distal pulses.  Tachycardia present.   No murmur  heard. Pulmonary/Chest: Effort normal and breath sounds normal. No respiratory distress.  Port to right upper chest. Lungs are clear.  Abdominal: Soft. There is no tenderness. There is no rebound and no guarding.  GJ tube in  place  Musculoskeletal: Normal range of motion. He exhibits no edema or tenderness.  Moving bilateral arms does not follow commands. Extremity contractures.   Skin: Skin is warm.  Psychiatric: He has a normal mood and affect. His behavior is normal.  Nursing note and vitals reviewed.   ED Course  Procedures (including critical care time) DIAGNOSTIC STUDIES: Oxygen Saturation is 100% on room air, normal by my interpretation.    COORDINATION OF CARE: 7:58 PM Discussed treatment plan with patient at beside, the patient agrees with the plan and has no further questions at this time.   Labs Review Labs Reviewed  CBC WITH DIFFERENTIAL/PLATELET - Abnormal; Notable for the following:    WBC 0.9 (*)    RBC 1.56 (*)    Hemoglobin 4.7 (*)    HCT 16.2 (*)    MCV 103.8 (*)    MCHC 29.0 (*)    RDW 16.4 (*)    Platelets 101 (*)    Neutrophils Relative % 16 (*)    Lymphocytes Relative 47 (*)    Monocytes Relative 34 (*)    Neutro Abs 0.1 (*)    Lymphs Abs 0.5 (*)    All other components within normal limits  COMPREHENSIVE METABOLIC PANEL - Abnormal; Notable for the following:    Glucose, Bld 122 (*)    Creatinine, Ser 0.48 (*)    Alkaline Phosphatase 141 (*)    All other components within normal limits  RETICULOCYTES - Abnormal; Notable for the following:    RBC. 1.56 (*)    Retic Count, Manual 14.0 (*)    All other components within normal limits  DIC (DISSEMINATED INTRAVASCULAR COAGULATION) PANEL - Abnormal; Notable for the following:    Platelets 86 (*)    All other components within normal limits  I-STAT CG4 LACTIC ACID, ED - Abnormal; Notable for the following:    Lactic Acid, Venous 2.66 (*)    All other components within normal limits  I-STAT VENOUS  BLOOD GAS, ED - Abnormal; Notable for the following:    pH, Ven 7.410 (*)    pO2, Ven 47.0 (*)    Bicarbonate 30.5 (*)    Acid-Base Excess 5.0 (*)    All other components within normal limits  CULTURE, BLOOD (ROUTINE X 2)  CULTURE, BLOOD (ROUTINE X 2)  PATHOLOGIST SMEAR REVIEW  URINALYSIS, ROUTINE W REFLEX MICROSCOPIC  VITAMIN B12  FOLATE  IRON AND TIBC  FERRITIN    Imaging Review Dg Chest Portable 1 View  03/01/2014   CLINICAL DATA:  Anemia. Chronic right lower lobe pneumonia. Neuro muscular disease.  EXAM: PORTABLE CHEST - 1 VIEW  COMPARISON:  11/06/2013  FINDINGS: Right jugular port appears unchanged in position. Spinal fixation hardware again noted.  There are low lung volumes. No airspace consolidation is evident. No large effusions are evident.  IMPRESSION: No acute findings   Electronically Signed   By: Andreas Newport M.D.   On: 03/01/2014 20:16     EKG Interpretation None      MDM   Final diagnoses:  Pancytopenia   Sent by PCP for recheck of labs. Patient has been increased sleepiness and paleness and decreased activity over the past week. Mother concerned he was having subclinical seizures but neurology did not think so. No fever.  Lactate 2.66. No CO2 retention on ABG. Chest x-ray shows no airspace disease.  Pancytopenia confirmed. Patient remains tachycardic in the 1 teens. No source of infection identified. No fever. Patient unable to provide a sample.  We'll not catheterize given neutropenia. Empiric antibiotics given.  Discussed with Dr. Alen Blew of hematology who agrees with DIC panel, folate, iron, B-12. Dr. Ernestina Patches to admit to stepdown unit at [facility]. DIC panel with normal coags.  No schistocytes.  DIC seems unlikely. Remains tachycardic but stable for transfer.  D/w PCP Dr. Jobe Igo and patient's guardians extensively.  BP 112/62 mmHg  Pulse 116  Temp(Src) 98.1 F (36.7 C) (Oral)  Resp 20  SpO2 100%    CRITICAL CARE Performed by: Ezequiel Essex Total critical care time: 45 Critical care time was exclusive of separately billable procedures and treating other patients. Critical care was necessary to treat or prevent imminent or life-threatening deterioration. Critical care was time spent personally by me on the following activities: development of treatment plan with patient and/or surrogate as well as nursing, discussions with consultants, evaluation of patient's response to treatment, examination of patient, obtaining history from patient or surrogate, ordering and performing treatments and interventions, ordering and review of laboratory studies, ordering and review of radiographic studies, pulse oximetry and re-evaluation of patient's condition.  I personally performed the services described in this documentation, which was scribed in my presence. The recorded information has been reviewed and is accurate.   Ezequiel Essex, MD 03/02/14 [id number]

## 2014-03-01 NOTE — ED Notes (Addendum)
Had labs drawn by home health nurse today  md called family w hgb of 5 this pm  Was sent for recheck of hgb   Pt pale and per mom not acting normal

## 2014-03-01 NOTE — Evaluation (Addendum)
Hospital: [facility] to [facility] EDP: [facility] Reason for transfer: pancytopenia   Pt w/ baseline CP, epilepsy, IVH, neuromuscular scoliosis chronic PNA. Went to PCPs office had random blood work that showed pancytopenia. Was redirected to [facility] for recheck. Findings as below. No petechiae, fever. Per report, mom noticed pt as more lethargic this week. CXR no acute infiltrate. Lactate 2.6. UA not drawn. Hemodyncamically stable. Though tachycardic-improved w/ IVF.  Blood cxs drawn Accept to SDU  Asked EDP to check DIC panel  Add on type and screen  Add UA/urine cx  CBC    Component Value Date/Time   WBC 0.9* 03/01/2014 2020   RBC 1.56* 03/01/2014 2020   HGB 4.7* 03/01/2014 2020   HCT 16.2* 03/01/2014 2020   PLT 101* 03/01/2014 2020   MCV 103.8* 03/01/2014 2020   MCH 30.1 03/01/2014 2020   MCHC 29.0* 03/01/2014 2020   RDW 16.4* 03/01/2014 2020   LYMPHSABS 0.5* 03/01/2014 2020   MONOABS 0.3 03/01/2014 2020   EOSABS 0.0 03/01/2014 2020   BASOSABS 0.0 03/01/2014 2020

## 2014-03-01 NOTE — ED Notes (Signed)
Mother reports labs were checked today at home around 1500 and a HGB of 5 resulted. Mother states labs taken due to him being pale and sleeping a lot lately.

## 2014-03-01 NOTE — ED Notes (Signed)
Pt placed on neutropenic precautions

## 2014-03-01 NOTE — ED Notes (Signed)
Per mom pt acting more like himself,  Color in face improving

## 2014-03-01 NOTE — ED Notes (Signed)
Patient's mother states he is unable to get urine sample at present time.

## 2014-03-01 NOTE — ED Notes (Signed)
Pt mom refused to sign transfer form until she talked to dr Earma Reading  She wants to take him home tonight and take him to [facility] in the am  Dr Wyvonnia Dusky informed  He called dr Earma Reading and put him on phone w mom

## 2014-03-02 ENCOUNTER — Other Ambulatory Visit (HOSPITAL_COMMUNITY): Payer: Medicaid Other

## 2014-03-02 DIAGNOSIS — J690 Pneumonitis due to inhalation of food and vomit: Secondary | ICD-10-CM

## 2014-03-02 DIAGNOSIS — D61818 Other pancytopenia: Principal | ICD-10-CM

## 2014-03-02 DIAGNOSIS — M4140 Neuromuscular scoliosis, site unspecified: Secondary | ICD-10-CM

## 2014-03-02 DIAGNOSIS — D709 Neutropenia, unspecified: Secondary | ICD-10-CM

## 2014-03-02 DIAGNOSIS — G808 Other cerebral palsy: Secondary | ICD-10-CM

## 2014-03-02 DIAGNOSIS — R1310 Dysphagia, unspecified: Secondary | ICD-10-CM

## 2014-03-02 DIAGNOSIS — D649 Anemia, unspecified: Secondary | ICD-10-CM

## 2014-03-02 DIAGNOSIS — G40211 Localization-related (focal) (partial) symptomatic epilepsy and epileptic syndromes with complex partial seizures, intractable, with status epilepticus: Secondary | ICD-10-CM

## 2014-03-02 DIAGNOSIS — D696 Thrombocytopenia, unspecified: Secondary | ICD-10-CM

## 2014-03-02 DIAGNOSIS — D72819 Decreased white blood cell count, unspecified: Secondary | ICD-10-CM

## 2014-03-02 DIAGNOSIS — G4733 Obstructive sleep apnea (adult) (pediatric): Secondary | ICD-10-CM

## 2014-03-02 LAB — CBC WITH DIFFERENTIAL/PLATELET
BASOS ABS: 0 10*3/uL (ref 0.0–0.1)
BASOS PCT: 0 % (ref 0–1)
Eosinophils Absolute: 0 10*3/uL (ref 0.0–0.7)
Eosinophils Relative: 2 % (ref 0–5)
HCT: 13.5 % — ABNORMAL LOW (ref 39.0–52.0)
Hemoglobin: 4.2 g/dL — CL (ref 13.0–17.0)
LYMPHS ABS: 0.4 10*3/uL — AB (ref 0.7–4.0)
Lymphocytes Relative: 39 % (ref 12–46)
MCH: 31.3 pg (ref 26.0–34.0)
MCHC: 31.1 g/dL (ref 30.0–36.0)
MCV: 100.7 fL — ABNORMAL HIGH (ref 78.0–100.0)
MONO ABS: 0.3 10*3/uL (ref 0.1–1.0)
Monocytes Relative: 34 % — ABNORMAL HIGH (ref 3–12)
Neutro Abs: 0.3 10*3/uL — ABNORMAL LOW (ref 1.7–7.7)
Neutrophils Relative %: 25 % — ABNORMAL LOW (ref 43–77)
Platelets: 90 10*3/uL — ABNORMAL LOW (ref 150–400)
RBC: 1.34 MIL/uL — ABNORMAL LOW (ref 4.22–5.81)
RDW: 17.3 % — AB (ref 11.5–15.5)
WBC: 1 10*3/uL — CL (ref 4.0–10.5)

## 2014-03-02 LAB — SEDIMENTATION RATE: SED RATE: 40 mm/h — AB (ref 0–16)

## 2014-03-02 LAB — COMPREHENSIVE METABOLIC PANEL
ALBUMIN: 3.2 g/dL — AB (ref 3.5–5.2)
ALT: 34 U/L (ref 0–53)
AST: 20 U/L (ref 0–37)
Alkaline Phosphatase: 123 U/L — ABNORMAL HIGH (ref 39–117)
Anion gap: 5 (ref 5–15)
BUN: 15 mg/dL (ref 6–23)
CALCIUM: 9 mg/dL (ref 8.4–10.5)
CO2: 32 mmol/L (ref 19–32)
Chloride: 105 mmol/L (ref 96–112)
Creatinine, Ser: 0.45 mg/dL — ABNORMAL LOW (ref 0.50–1.35)
GFR calc Af Amer: >90 mL/min (ref >90)
Glucose, Bld: 94 mg/dL (ref 70–99)
Potassium: 3.8 mmol/L (ref 3.5–5.1)
SODIUM: 142 mmol/L (ref 135–145)
TOTAL PROTEIN: 6.1 g/dL (ref 6.0–8.3)
Total Bilirubin: 0.8 mg/dL (ref 0.3–1.2)

## 2014-03-02 LAB — CBC
HEMATOCRIT: 22.7 % — AB (ref 39.0–52.0)
HEMOGLOBIN: 7.6 g/dL — AB (ref 13.0–17.0)
MCH: 30.5 pg (ref 26.0–34.0)
MCHC: 33.5 g/dL (ref 30.0–36.0)
MCV: 91.2 fL (ref 78.0–100.0)
PLATELETS: 90 10*3/uL — AB (ref 150–400)
RBC: 2.49 MIL/uL — ABNORMAL LOW (ref 4.22–5.81)
RDW: 16.3 % — AB (ref 11.5–15.5)
WBC: 0.8 10*3/uL — AB (ref 4.0–10.5)

## 2014-03-02 LAB — IRON AND TIBC
Iron: 45 ug/dL (ref 42–165)
Saturation Ratios: 16 % — ABNORMAL LOW (ref 20–55)
TIBC: 285 ug/dL (ref 215–435)
UIBC: 240 ug/dL (ref 125–400)

## 2014-03-02 LAB — ABO/RH: ABO/RH(D): O POS

## 2014-03-02 LAB — FOLATE: Folate: >20 ng/mL

## 2014-03-02 LAB — PREALBUMIN: Prealbumin: 24.4 mg/dL (ref 17.0–34.0)

## 2014-03-02 LAB — DIRECT ANTIGLOBULIN TEST (NOT AT ARMC)
DAT, IgG: NEGATIVE
DAT, complement: NEGATIVE

## 2014-03-02 LAB — APTT: APTT: 31 s (ref 24–37)

## 2014-03-02 LAB — PREPARE RBC (CROSSMATCH)

## 2014-03-02 LAB — FERRITIN: Ferritin: 348 ng/mL — ABNORMAL HIGH (ref 22–322)

## 2014-03-02 LAB — PROTIME-INR
INR: 1.08 (ref 0.00–1.49)
Prothrombin Time: 14.2 seconds (ref 11.6–15.2)

## 2014-03-02 LAB — C-REACTIVE PROTEIN: CRP: 1.5 mg/dL — AB (ref <0.60)

## 2014-03-02 LAB — LACTATE DEHYDROGENASE: LDH: 95 U/L (ref 94–250)

## 2014-03-02 LAB — VITAMIN B12: VITAMIN B 12: 704 pg/mL (ref 211–911)

## 2014-03-02 LAB — CLOSTRIDIUM DIFFICILE BY PCR: CDIFFPCR: NEGATIVE

## 2014-03-02 LAB — MRSA PCR SCREENING: MRSA by PCR: NEGATIVE

## 2014-03-02 MED ORDER — ONDANSETRON HCL 4 MG PO TABS: 4.0000 mg | ORAL_TABLET | Freq: Four times a day (QID) | ORAL | Status: DC | PRN

## 2014-03-02 MED ORDER — LANSOPRAZOLE 3 MG/ML SUSP
30.0000 mg | Freq: Two times a day (BID) | ORAL | Status: DC
  Administered 2014-03-02 – 2014-03-03 (×2): 30 mg
  Filled 2014-03-02 (×7): qty 10

## 2014-03-02 MED ORDER — DIAZEPAM 10 MG RE GEL: 12.5000 mg | RECTAL | Status: DC | PRN

## 2014-03-02 MED ORDER — GABAPENTIN 250 MG/5ML PO SOLN
900.0000 mg | Freq: Three times a day (TID) | ORAL | Status: DC
  Administered 2014-03-02 – 2014-03-04 (×6): 900 mg
  Filled 2014-03-02 (×9): qty 18

## 2014-03-02 MED ORDER — TWOCAL HN PO LIQD
90.0000 mL | Freq: Every day | ORAL | Status: DC
  Administered 2014-03-03: 90 mL

## 2014-03-02 MED ORDER — SODIUM CHLORIDE 0.9 % IJ SOLN
3.0000 mL | Freq: Two times a day (BID) | INTRAMUSCULAR | Status: DC
  Administered 2014-03-02: 3 mL via INTRAVENOUS

## 2014-03-02 MED ORDER — FOLIC ACID 1 MG PO TABS
1.0000 mg | ORAL_TABLET | Freq: Every day | ORAL | Status: DC
  Administered 2014-03-02 – 2014-03-04 (×3): 1 mg
  Filled 2014-03-02 (×4): qty 1

## 2014-03-02 MED ORDER — PSEUDOEPHEDRINE HCL 30 MG/5ML PO LIQD
180.0000 mg | Freq: Four times a day (QID) | ORAL | Status: DC
  Administered 2014-03-02 – 2014-03-03 (×4): 180 mg
  Filled 2014-03-02 (×9): qty 30

## 2014-03-02 MED ORDER — TWOCAL HN PO LIQD: 90.0000 mL | Freq: Every day | ORAL | Status: DC

## 2014-03-02 MED ORDER — FREE WATER
172.0000 mL | Freq: Four times a day (QID) | Status: DC
  Administered 2014-03-02 – 2014-03-03 (×5): 172 mL

## 2014-03-02 MED ORDER — TWOCAL HN PO LIQD
237.0000 mL | ORAL | Status: DC
  Administered 2014-03-03: 237 mL

## 2014-03-02 MED ORDER — ALBUTEROL SULFATE (2.5 MG/3ML) 0.083% IN NEBU: 3.0000 mL | INHALATION_SOLUTION | RESPIRATORY_TRACT | Status: DC | PRN

## 2014-03-02 MED ORDER — ACETAMINOPHEN 650 MG RE SUPP
650.0000 mg | Freq: Four times a day (QID) | RECTAL | Status: DC | PRN
  Filled 2014-03-02: qty 1

## 2014-03-02 MED ORDER — NON FORMULARY: Freq: Two times a day (BID) | Status: DC

## 2014-03-02 MED ORDER — SUCRALFATE 1 GM/10ML PO SUSP
1.0000 g | Freq: Four times a day (QID) | ORAL | Status: DC
  Administered 2014-03-02 – 2014-03-04 (×9): 1 g via ORAL
  Filled 2014-03-02 (×9): qty 10

## 2014-03-02 MED ORDER — ALBUTEROL SULFATE (2.5 MG/3ML) 0.083% IN NEBU
2.5000 mg | INHALATION_SOLUTION | Freq: Four times a day (QID) | RESPIRATORY_TRACT | Status: DC
  Administered 2014-03-02 – 2014-03-04 (×8): 2.5 mg via RESPIRATORY_TRACT
  Filled 2014-03-02 (×9): qty 3

## 2014-03-02 MED ORDER — FLUCONAZOLE 40 MG/ML PO SUSR
100.0000 mg | Freq: Every day | ORAL | Status: DC
  Administered 2014-03-02 – 2014-03-03 (×2): 100 mg via ORAL
  Filled 2014-03-02 (×4): qty 2.5

## 2014-03-02 MED ORDER — ACETAMINOPHEN 325 MG PO TABS: 650.0000 mg | ORAL_TABLET | Freq: Four times a day (QID) | ORAL | Status: DC | PRN

## 2014-03-02 MED ORDER — VANCOMYCIN HCL IN DEXTROSE 750-5 MG/150ML-% IV SOLN
750.0000 mg | Freq: Three times a day (TID) | INTRAVENOUS | Status: DC
  Administered 2014-03-02 – 2014-03-04 (×7): 750 mg via INTRAVENOUS
  Filled 2014-03-02 (×9): qty 150

## 2014-03-02 MED ORDER — TWOCAL HN PO LIQD: 237.0000 mL | ORAL | Status: DC

## 2014-03-02 MED ORDER — DEXTROSE 5 % IV SOLN
2.0000 g | Freq: Three times a day (TID) | INTRAVENOUS | Status: DC
  Administered 2014-03-02 – 2014-03-04 (×7): 2 g via INTRAVENOUS
  Filled 2014-03-02 (×10): qty 2

## 2014-03-02 MED ORDER — SODIUM CHLORIDE 0.9 % IV SOLN
Freq: Once | INTRAVENOUS | Status: AC
  Administered 2014-03-02: 06:00:00 via INTRAVENOUS

## 2014-03-02 MED ORDER — PANTOPRAZOLE SODIUM 40 MG PO PACK
40.0000 mg | PACK | Freq: Two times a day (BID) | ORAL | Status: DC
  Administered 2014-03-02: 40 mg
  Filled 2014-03-02 (×3): qty 20

## 2014-03-02 MED ORDER — TWOCAL HN PO LIQD: 237.0000 mL | Freq: Every day | ORAL | Status: DC

## 2014-03-02 MED ORDER — SIMETHICONE 80 MG PO CHEW
80.0000 mg | CHEWABLE_TABLET | Freq: Four times a day (QID) | ORAL | Status: DC | PRN
  Administered 2014-03-04 (×2): 80 mg
  Filled 2014-03-02 (×3): qty 1

## 2014-03-02 MED ORDER — LEVETIRACETAM 100 MG/ML PO SOLN
1750.0000 mg | Freq: Two times a day (BID) | ORAL | Status: DC
Start: 2014-03-02 — End: 2014-03-03
  Administered 2014-03-02 (×2): 1750 mg via ORAL
  Filled 2014-03-02 (×5): qty 20

## 2014-03-02 MED ORDER — PRO-STAT SUGAR FREE PO LIQD
30.0000 mL | Freq: Two times a day (BID) | ORAL | Status: DC
  Administered 2014-03-02 – 2014-03-03 (×3): 30 mL
  Filled 2014-03-02 (×8): qty 30

## 2014-03-02 MED ORDER — LEVETIRACETAM 100 MG/ML PO SOLN
1750.0000 mg | Freq: Two times a day (BID) | ORAL | Status: DC
  Administered 2014-03-02 – 2014-03-04 (×5): 1750 mg
  Filled 2014-03-02 (×7): qty 20

## 2014-03-02 MED ORDER — ONDANSETRON HCL 4 MG/2ML IJ SOLN: 4.0000 mg | Freq: Four times a day (QID) | INTRAMUSCULAR | Status: DC | PRN

## 2014-03-02 MED ORDER — GABAPENTIN 250 MG/5ML PO SOLN
900.0000 mg | Freq: Three times a day (TID) | ORAL | Status: DC
  Administered 2014-03-02: 900 mg
  Filled 2014-03-02 (×4): qty 18

## 2014-03-02 MED ORDER — SODIUM CHLORIDE 0.9 % IV SOLN
50.0000 mg | Freq: Two times a day (BID) | INTRAVENOUS | Status: DC
  Administered 2014-03-02 – 2014-03-03 (×4): 50 mg via INTRAVENOUS
  Filled 2014-03-02 (×9): qty 5

## 2014-03-02 NOTE — Progress Notes (Signed)
EEG to be cancelled per Dr Leonel Ramsay.

## 2014-03-02 NOTE — Consult Note (Signed)
Neurology Consultation Reason for Consult: Seizures Referring Physician: Cruzita Lederer, C  CC: Seizures  History is obtained from:patient's mother  HPI: Jeffery [name] is a 24 y.o. male with a history of CP secondary to IVH as a neonate. He has seizures currently managed on Keppra as well as gabapentin. The gabapentin was originally started for pain. He has been having more frequent episodes of brief loss of consciousness over the past few weeks. Mother was concerned that this may represent a new type of seizure. He was evaluated at his PCPs office and was found to be pancytopenic and referred for admission.    ROS:  Unable to obtain due to altered mental status.   Past Medical History  Diagnosis Date  . CP (cerebral palsy), spastic, quadriplegic   . Epilepsy   . Osteoporosis   . Inguinal hernia   . Undescended testes   . Seasonal allergies   . IVH (intraventricular hemorrhage)     Grade IV  . Hip dislocation, bilateral   . Seizures   . Hx: UTI (urinary tract infection)   . Dysphagia   . Otitis media   . Retinopathy of prematurity   . 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 Neuromuscular scoliosis   . Osteoporosis   . Complex partial seizures   . Generalized convulsive epilepsy without mention of intractable epilepsy   . Epilepsy   . Sinus bradycardia     HR drops to 38-40 while sleeping  . Sleep apnea     BiPAP  . Blister of right heel     fluid filled; origin unknown  . Kidney stones     ?  Marland Kitchen Pneumonia      chronic pneumonia ,respitory failure dx Augest 2014    Family History: Unable to obtain due to altered mental status.  Social History: Lives with legal guardian and her husband  Exam: Current vital signs: BP 113/84 mmHg  Pulse 105  Temp(Src) 99.1 F (37.3 C) (Axillary)  Resp 17  Ht 4\' 11"  (1.499 m)  Wt 60.8 kg (134 lb 0.6 oz)  BMI 27.06 kg/m2  SpO2 100% Vital signs in last 24 hours: Temp:  [98.1 F (36.7 C)-99.9 F (37.7 C)] 99.1 F (37.3 C)  (01/30 1640) Pulse Rate:  [93-120] 105 (01/30 1640) Resp:  [0-24] 17 (01/30 1640) BP: (104-118)/(43-84) 113/84 mmHg (01/30 1130) SpO2:  [98 %-100 %] 100 % (01/30 1640) Weight:  [60.8 kg (134 lb 0.6 oz)] 60.8 kg (134 lb 0.6 oz) (01/30 0145)  Physical Exam  Constitutional: Appears adequately nourished Psych: Appears annoyed with the examiner Eyes: No scleral injection HENT: No OP obstrucion Head: Normocephalic.  Cardiovascular: Normal rate and regular rhythm.  Respiratory: Effort normal  GI: Soft.  No distension. There is no tenderness.    Neuro: Mental Status: Patient is sleepy but arousable, he interacts tongue is mother that he does not want his glasses on. He fixates and tracks  Cranial Nerves: II: Close his eyes tightly, so it is impossible to check blink to threat. Pupils are equal, round, and reactive to light.   III,IV, VI: He crosses midline both directions V,VII: Blinks to eyelid stimulation bilaterally, good strength with eye closure VIII, X, XI, XII: Unable to assess secondary to patient's altered mental status.  Motor: He has a spastic quadriparesis with contractures, but does move all 4 extremities. Sensory: He has some response to noxious stimuli in all 4 extremities Cerebellar: Patient is unable to perform   I have reviewed labs  in epic and the results pertinent to this consultation are: Pancytopenia   Impression: 24 year old male with seizures secondary to intraventricular hemorrhage with resultant cerebral palsy. I suspect that the recent episodes have been more due to hypoperfusion due to his low hemoglobin rather than actual breakthrough seizures. He is currently being transfused, if the episodes continue on Monday and then could consider continuous EEG.  Recommendations: 1) continue Keppra 1750 mg twice a day 2) continue Vimpat 50 mg twice a day(we'll need to give IV as I don't think we have the liquid) 3) continue gabapentin 900 mg twice a  day   Roland Rack, MD [facility] [phone]  If 7pm- 7am, please page neurology on call as listed in [place].

## 2014-03-02 NOTE — Consult Note (Signed)
Inpatient Hematology/Oncology Consultation   Name: Jeffery [name]      MRN: [id number]    Location: 2C14C/2C14C-01  Date: 03/02/2014 Time:1:08 PM   REFERRING PHYSICIAN:  Dr. Wyvonnia Dusky  REASON FOR CONSULT:  Pancytopenia     Hx chronic thrombocytopenia     DIAGNOSIS:  Severe anemia, leukopenia  HISTORY OF PRESENT ILLNESS:   24 y.o. male with Past medical history of cerebral palsy with spastic quadriplegia, epilepsy, hx intraventricular hemorrhage, recurrent aspiration pneumonia, dysphagia with PEGtube placement, chronic diarrhea. Patient is on multiple anticonvulsants secondary to complex partial seizures with episodes of status epilepticus.(medications include gabapentin, keppra, vimpat) Vimpat was added about 6 months ago after a grand mal seizure.   He was brought to the ED by family when he was noted to be more lethargic than normal.  Family notes no fever, chills, no BRBPR, dark stool or other significant change other than lethargy.   Workup thus far:  -thrombocytopenia is chronic, and for the most part stable -DIC panel is unremarkable -I personally reviewed the peripheral smear and it was relatively "bland" large platelets noted, no blasts, obvious low wbc's but all cells appeared normal, no schistocytes -inappropriate reticulocyte response - DAT negative - HIV, parvovirus pending - LDH normal at 95, lactic acid mildly elevated at 2.66 -CXR unremarkable, cultures obtained, started on broad spectrum antibiotics    PAST MEDICAL HISTORY:   Past Medical History  Diagnosis Date  . CP (cerebral palsy), spastic, quadriplegic   . Epilepsy   . Osteoporosis   . Inguinal hernia   . Undescended testes   . Seasonal allergies   . IVH (intraventricular hemorrhage)     Grade IV  . Hip dislocation, bilateral   . Seizures   . Hx: UTI (urinary tract infection)   . Dysphagia   . Otitis media   . Retinopathy of prematurity   . 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  Neuromuscular scoliosis   . Osteoporosis   . Complex partial seizures   . Generalized convulsive epilepsy without mention of intractable epilepsy   . Epilepsy   . Sinus bradycardia     HR drops to 38-40 while sleeping  . Sleep apnea     BiPAP  . Blister of right heel     fluid filled; origin unknown  . Kidney stones     ?  Marland Kitchen Pneumonia      chronic pneumonia ,respitory failure dx Augest 2014    ALLERGIES: Allergies  Allergen Reactions  . Adhesive [Tape]     Rips skin off  . Depakote [Divalproex Sodium]     Causes pancreatitis   . Amoxicillin-Pot Clavulanate Rash      MEDICATIONS: I have reviewed the patient's current medications.     PAST SURGICAL HISTORY Past Surgical History  Procedure Laterality Date  . Excision of moles    . Peg placement      With multiple changes  . Gastrostomy tube, change / reposition  12/15/2010       . Button change  12/15/2010    Procedure: BUTTON CHANGE;  Surgeon: Gatha Mayer, MD;  Location: [facility];  Service: Endoscopy;  Laterality: N/A;  . Peg placement  10/07/2011    Procedure: PERCUTANEOUS ENDOSCOPIC GASTROSTOMY (PEG) REPLACEMENT;  Surgeon: Lafayette Dragon, MD;  Location: [facility];  Service: Endoscopy;  Laterality: N/A;  Needs 18 F 2.5 button ordered-dl  . Inguinal hernia repair Bilateral 1992  . Retinopathy of prematurity surgery  1992  .  Achilles tendon lengthening  12/1998  . Soft tissue releases  wrists and fingers  12/1998  . Intrathecal baclofen pump placement  07/25/2000  . Peg placement N/A 06/05/2012    Procedure: PERCUTANEOUS ENDOSCOPIC GASTROSTOMY (PEG) REPLACEMENT;  Surgeon: Lafayette Dragon, MD;  Location: [facility];  Service: Endoscopy;  Laterality: N/A;  button 58f.2.5cm  . Eye surgery Bilateral     Retinal   . Back surgery      Harrington Rods in back needs to be log rolled  . Tendon repair Bilateral 09/05/2012    Procedure: LENGTHENING OF DIGITAL FLEXOR TENDONS BILTERAL HANDS;  Surgeon: DJolyn Nap MD;   [facility]  Service: Orthopedics;  Laterality: Bilateral;  . Hand surgery Bilateral Aug. 2014  . Peg placement N/A 09/13/2012    Procedure: PERCUTANEOUS ENDOSCOPIC GASTROSTOMY (PEG) REPLACEMENT;  Surgeon: DLafayette Dragon MD;  Location: [facility];  Service: Endoscopy;  Laterality: N/A;  . Flexible sigmoidoscopy N/A 10/30/2012    Procedure: FLEXIBLE SIGMOIDOSCOPY;  Surgeon: RCleotis Nipper MD;  Location: [facility];  Service: Endoscopy;  Laterality: N/A;  . Peg placement N/A 11/15/2012    Procedure: PERCUTANEOUS ENDOSCOPIC GASTROSTOMY (PEG) REPLACEMENT;  Surgeon: MJeryl Columbia MD;  [facility];  Service: Endoscopy;  Laterality: N/A;    FAMILY HISTORY: Family History  Problem Relation Age of Onset  . Adopted: Yes    SOCIAL HISTORY:  reports that he has never smoked. He has never used smokeless tobacco. He reports that he does not drink alcohol or use illicit drugs.  PERFORMANCE STATUS: The patient's performance status is 2 - Symptomatic, <50% confined to bed  PHYSICAL EXAM: Most Recent Vital Signs: Blood pressure 113/84, pulse 103, temperature 99 F (37.2 C), temperature source Axillary, resp. rate 17, height _0  (1.499 m), weight 134 lb 0.6 oz (60.8 kg), SpO2 99 %. General appearance: cooperative, no distress and slowed mentation Head: Normocephalic, without obvious abnormality, atraumatic Neck: no adenopathy, no carotid bruit, no JVD, supple, symmetrical, trachea midline and thyroid not enlarged, symmetric, no tenderness/mass/nodules Lungs: rhonchi bilaterally Heart: S1, S2 normal Abdomen: soft, non-tender; bowel sounds normal; no masses,  no organomegaly Skin: Skin color, texture, turgor normal. No rashes or lesions Lymph nodes: Cervical, supraclavicular, and axillary nodes normal. Neurologic: Motor: spasticity upper and lower extemities  LABORATORY DATA:  Results for orders placed or performed during the hospital encounter of 03/01/14 (from the past 48  hour(s))  CBC with Differential/Platelet     Status: Abnormal   Collection Time: 03/01/14  8:20 PM  Result Value Ref Range   WBC 0.9 (LL) 4.0 - 10.5 K/uL    Comment: CRITICAL RESULT CALLED TO, READ BACK BY AND VERIFIED WITH: SCOTT BENNETT RN AT 2100 03/01/14 BY I.SUGUT REPEATED TO VERIFY    RBC 1.56 (L) 4.22 - 5.81 MIL/uL   Hemoglobin 4.7 (LL) 13.0 - 17.0 g/dL    Comment: CRITICAL RESULT CALLED TO, READ BACK BY AND VERIFIED WITH: SCOTT BENNETT RN AT 2100 03/01/14 BY I.SUGUT REPEATED TO VERIFY    HCT 16.2 (L) 39.0 - 52.0 %   MCV 103.8 (H) 78.0 - 100.0 fL   MCH 30.1 26.0 - 34.0 pg   MCHC 29.0 (L) 30.0 - 36.0 g/dL   RDW 16.4 (H) 11.5 - 15.5 %   Platelets 101 (L) 150 - 400 K/uL    Comment: PLATELET COUNT CONFIRMED BY SMEAR   Neutrophils Relative % 16 (L) 43 - 77 %   Lymphocytes Relative 47 (H) 12 - 46 %  Monocytes Relative 34 (H) 3 - 12 %   Eosinophils Relative 3 0 - 5 %   Basophils Relative 0 0 - 1 %   Neutro Abs 0.1 (L) 1.7 - 7.7 K/uL   Lymphs Abs 0.5 (L) 0.7 - 4.0 K/uL   Monocytes Absolute 0.3 0.1 - 1.0 K/uL   Eosinophils Absolute 0.0 0.0 - 0.7 K/uL   Basophils Absolute 0.0 0.0 - 0.1 K/uL  Comprehensive metabolic panel     Status: Abnormal   Collection Time: 03/01/14  8:20 PM  Result Value Ref Range   Sodium 138 135 - 145 mmol/L   Potassium 3.7 3.5 - 5.1 mmol/L   Chloride 101 96 - 112 mmol/L   CO2 31 19 - 32 mmol/L   Glucose, Bld 122 (H) 70 - 99 mg/dL   BUN 21 6 - 23 mg/dL   Creatinine, Ser 0.48 (L) 0.50 - 1.35 mg/dL   Calcium 9.0 8.4 - 10.5 mg/dL   Total Protein 7.2 6.0 - 8.3 g/dL   Albumin 3.9 3.5 - 5.2 g/dL   AST 26 0 - 37 U/L   ALT 39 0 - 53 U/L   Alkaline Phosphatase 141 (H) 39 - 117 U/L   Total Bilirubin 0.8 0.3 - 1.2 mg/dL   GFR calc non Af Amer >90 >90 mL/min   GFR calc Af Amer >90 >90 mL/min    Comment: (NOTE) The eGFR has been calculated using the CKD EPI equation. This calculation has not been validated in all clinical situations. eGFR's persistently <90  mL/min signify possible Chronic Kidney Disease.    Anion gap 6 5 - 15  Reticulocytes     Status: Abnormal   Collection Time: 03/01/14  8:20 PM  Result Value Ref Range   Retic Ct Pct 0.9 0.4 - 3.1 %   RBC. 1.56 (L) 4.22 - 5.81 MIL/uL   Retic Count, Manual 14.0 (L) 19.0 - 186.0 K/uL    Comment: Performed at [facility] venous blood gas, ED     Status: Abnormal   Collection Time: 03/01/14  8:28 PM  Result Value Ref Range   pH, Ven 7.410 (H) 7.250 - 7.300   pCO2, Ven 48.2 45.0 - 50.0 mmHg   pO2, Ven 47.0 (H) 30.0 - 45.0 mmHg   Bicarbonate 30.5 (H) 20.0 - 24.0 mEq/L   TCO2 32 0 - 100 mmol/L   O2 Saturation 82.0 %   Acid-Base Excess 5.0 (H) 0.0 - 2.0 mmol/L   Patient temperature 98.8 F    Collection site IV START    Drawn by RT    Sample type VENOUS   I-Stat CG4 Lactic Acid, ED     Status: Abnormal   Collection Time: 03/01/14  8:31 PM  Result Value Ref Range   Lactic Acid, Venous 2.66 (HH) 0.5 - 2.0 mmol/L   Comment NOTIFIED PHYSICIAN   DIC panel     Status: Abnormal   Collection Time: 03/01/14 10:47 PM  Result Value Ref Range   Prothrombin Time 13.9 11.6 - 15.2 seconds   INR 1.07 0.00 - 1.49   aPTT 34 24 - 37 seconds   Fibrinogen 447 204 - 475 mg/dL   D-Dimer, Quant <0.27 0.00 - 0.48 ug/mL-FEU    Comment:        AT THE INHOUSE ESTABLISHED CUTOFF VALUE OF 0.48 ug/mL FEU, THIS ASSAY HAS BEEN DOCUMENTED IN THE LITERATURE TO HAVE A SENSITIVITY AND NEGATIVE PREDICTIVE VALUE OF AT LEAST 98 TO 99%.  THE  TEST RESULT SHOULD BE CORRELATED WITH AN ASSESSMENT OF THE CLINICAL PROBABILITY OF DVT / VTE.    Platelets 86 (L) 150 - 400 K/uL    Comment: PLATELET COUNT CONFIRMED BY SMEAR   Smear Review NO SCHISTOCYTES SEEN   Vitamin B12     Status: None   Collection Time: 03/01/14 10:48 PM  Result Value Ref Range   Vitamin B-12 704 211 - 911 pg/mL    Comment: Performed at [facility]  Folate     Status: None   Collection Time: 03/01/14 10:48 PM  Result  Value Ref Range   Folate >20.0 ng/mL    Comment: (NOTE) Reference Ranges        Deficient:       0.4 - 3.3 ng/mL        Indeterminate:   3.4 - 5.4 ng/mL        Normal:              > 5.4 ng/mL Performed at [facility]   Iron and TIBC     Status: Abnormal   Collection Time: 03/01/14 10:48 PM  Result Value Ref Range   Iron 45 42 - 165 ug/dL   TIBC 285 215 - 435 ug/dL   Saturation Ratios 16 (L) 20 - 55 %   UIBC 240 125 - 400 ug/dL    Comment: Performed at [facility]  Ferritin     Status: Abnormal   Collection Time: 03/01/14 10:48 PM  Result Value Ref Range   Ferritin 348 (H) 22 - 322 ng/mL    Comment: Performed at [facility]  MRSA PCR Screening     Status: None   Collection Time: 03/02/14  1:53 AM  Result Value Ref Range   MRSA by PCR NEGATIVE NEGATIVE    Comment:        The GeneXpert MRSA Assay (FDA approved for NASAL specimens only), is one component of a comprehensive MRSA colonization surveillance program. It is not intended to diagnose MRSA infection nor to guide or monitor treatment for MRSA infections.   Direct antiglobulin test     Status: None   Collection Time: 03/02/14  3:06 AM  Result Value Ref Range   DAT, complement NEG    DAT, IgG NEG   Prepare RBC     Status: None   Collection Time: 03/02/14  3:30 AM  Result Value Ref Range   Order Confirmation ORDER PROCESSED BY BLOOD BANK   Comprehensive metabolic panel     Status: Abnormal   Collection Time: 03/02/14  4:00 AM  Result Value Ref Range   Sodium 142 135 - 145 mmol/L   Potassium 3.8 3.5 - 5.1 mmol/L   Chloride 105 96 - 112 mmol/L   CO2 32 19 - 32 mmol/L   Glucose, Bld 94 70 - 99 mg/dL   BUN 15 6 - 23 mg/dL   Creatinine, Ser 0.45 (L) 0.50 - 1.35 mg/dL   Calcium 9.0 8.4 - 10.5 mg/dL   Total Protein 6.1 6.0 - 8.3 g/dL   Albumin 3.2 (L) 3.5 - 5.2 g/dL   AST 20 0 - 37 U/L   ALT 34 0 - 53 U/L   Alkaline Phosphatase 123 (H) 39 - 117 U/L   Total Bilirubin 0.8 0.3 - 1.2  mg/dL   GFR calc non Af Amer >90 >90 mL/min   GFR calc Af Amer >90 >90 mL/min    Comment: (NOTE) The eGFR has been calculated using  the CKD EPI equation. This calculation has not been validated in all clinical situations. eGFR's persistently <90 mL/min signify possible Chronic Kidney Disease.    Anion gap 5 5 - 15  Protime-INR     Status: None   Collection Time: 03/02/14  4:00 AM  Result Value Ref Range   Prothrombin Time 14.2 11.6 - 15.2 seconds   INR 1.08 0.00 - 1.49  APTT     Status: None   Collection Time: 03/02/14  4:00 AM  Result Value Ref Range   aPTT 31 24 - 37 seconds  Type and screen     Status: None (Preliminary result)   Collection Time: 03/02/14  4:00 AM  Result Value Ref Range   ABO/RH(D) O POS    Antibody Screen NEG    Sample Expiration 03/05/2014    Unit Number [id number]    Blood Component Type RED CELLS,LR    Unit division 00    Status of Unit ISSUED    Transfusion Status OK TO TRANSFUSE    Crossmatch Result Compatible    Unit Number [id number]    Blood Component Type RED CELLS,LR    Unit division 00    Status of Unit ISSUED    Transfusion Status OK TO TRANSFUSE    Crossmatch Result Compatible   Lactate dehydrogenase     Status: None   Collection Time: 03/02/14  4:00 AM  Result Value Ref Range   LDH 95 94 - 250 U/L  ABO/Rh     Status: None   Collection Time: 03/02/14  4:00 AM  Result Value Ref Range   ABO/RH(D) O POS   CBC with Differential/Platelet     Status: Abnormal   Collection Time: 03/02/14  5:23 AM  Result Value Ref Range   WBC 1.0 (LL) 4.0 - 10.5 K/uL    Comment: RESULTS VERIFIED VIA RECOLLECT CRITICAL RESULT CALLED TO, READ BACK BY AND VERIFIED WITH: MICHELLE EVANGELISTA RN _0  01.30.16 KCLARK    RBC 1.34 (L) 4.22 - 5.81 MIL/uL    Comment: RESULTS VERIFIED VIA RECOLLECT   Hemoglobin 4.2 (LL) 13.0 - 17.0 g/dL    Comment: RESULTS VERIFIED VIA RECOLLECT CRITICAL RESULT CALLED TO, READ BACK BY AND VERIFIED WITH: MICHELLE  EVANGELISTA RN _1  01.30.16 KCLARK    HCT 13.5 (L) 39.0 - 52.0 %   MCV 100.7 (H) 78.0 - 100.0 fL   MCH 31.3 26.0 - 34.0 pg   MCHC 31.1 30.0 - 36.0 g/dL   RDW 17.3 (H) 11.5 - 15.5 %   Platelets 90 (L) 150 - 400 K/uL    Comment: RESULTS VERIFIED VIA RECOLLECT CONSISTENT WITH PREVIOUS RESULT    Neutrophils Relative % 25 (L) 43 - 77 %   Lymphocytes Relative 39 12 - 46 %   Monocytes Relative 34 (H) 3 - 12 %   Eosinophils Relative 2 0 - 5 %   Basophils Relative 0 0 - 1 %   Neutro Abs 0.3 (L) 1.7 - 7.7 K/uL   Lymphs Abs 0.4 (L) 0.7 - 4.0 K/uL   Monocytes Absolute 0.3 0.1 - 1.0 K/uL   Eosinophils Absolute 0.0 0.0 - 0.7 K/uL   Basophils Absolute 0.0 0.0 - 0.1 K/uL   Smear Review LARGE PLATELETS PRESENT       RADIOGRAPHY: Dg Chest Portable 1 View  03/01/2014   CLINICAL DATA:  Anemia. Chronic right lower lobe pneumonia. Neuro muscular disease.  EXAM: PORTABLE CHEST - 1 VIEW  COMPARISON:  11/06/2013  FINDINGS: Right jugular port  appears unchanged in position. Spinal fixation hardware again noted.  There are low lung volumes. No airspace consolidation is evident. No large effusions are evident.  IMPRESSION: No acute findings   Electronically Signed   By: Andreas Newport M.D.   On: 03/01/2014 20:16       ASSESSMENT: New onset pancytopenia with severe anemia and leukopenia/neutropenia. Patient with normal CBC on 04/17/2013 except for mild thrombocytopenia which is chronic  Peripheral smear without obvious cause -- large platelets noted, no schistocytes or abnormal wbc population  DAT wnl, LDH wnl, inappropriate reticulocyte response B12, folate, WNL Iron studies with low iron saturation (not severe), elevated ferritin  PLAN:   Discussed extensively with patient's mother -- She does not want a BMBX or anything "invasive"  We discusssed possible causes for his pancytopenia including medications and of course acute leukemia. She is ok with sending peripheral blood for flow cytometry.  We  will wait for other labs to return.  Would transfuse PRBC as needed. Would not give neulasta currently as he has had no evidence of infection.   Will follow blood work as more results become available. Will follow-up in am. Please call if needed sooner. All questions were answered.   Molli Hazard MD

## 2014-03-02 NOTE — Progress Notes (Signed)
Called Dr. Posey Pronto, P, and he was made of AM labs.  Alessandra Grout, RN

## 2014-03-02 NOTE — Progress Notes (Signed)
PROGRESS NOTE  Jeffery [name] QBH:[id number] DOB: 1990-02-22 DOA: 03/01/2014 PCP: Jeffery Bravo, MD  HPI: Patient seen and examined this morning, just admitted by Dr. Posey [name]. Has a history of spastic quadriparesis on Baclofen pump, seizure disorder in the setting of premature delivery at 25 weeks c/b grade 4 intraventricular hemorrhage (primary Neurologist is Dr. Gaynell [name]) as well as recurrent hospitalizations for aspiration pneumonias, admitted last night with new onset pancytopenia. He has been more lethargic over the past week and his mother also felt like he is having subclinical seizures. She reports no fevers/chills, no blood in his stool or urine, no dark stools. No problems with his feeds, he has ongoing chronic diarrhea.  Subjective / 24 H Interval events - sleeping this morning, intermittently opening eyes, non verbal. Mother Jeffery [name] at bedside.  Assessment/Plan: Principal Problem:   Pancytopenia Active Problems:   Congenital spastic quadriplegia   Dysphagia   Neuromuscular scoliosis   Complex partial seizures   OSA on CPAP   Aspiration pneumonia    Pancytopenia - workup underway, DIC panel is unremarkable - no schistocytes on smear - no blasts on smear - continue neutropenic precautions, ANC 300 - transfuse 2U pRBC, recheck CBC afterwards - mild macrocytosis, B12 normal at 704 however will need to check MMA as well, ordered - DAT negative - HIV, parvovirus pending - LDH normal at 95, lactic acid mildly elevated at 2.66 - I have consulted Hematology discussed with Dr. Whitney [name], appreciate further input - afebrile, without respiratory symptoms, CXR unremarkable, low suspicion for infection for now, cultures obtained, started on broad spectrum antibiotics which can be discontinued at 48 - 72 h if cultures negative  CP with spastic quadriplegia and seizure disorder - ?sublinical seizures, possible in the setting of profound anemia - I have consulted Jeffery [name] from  Neurology, appreciate his input into Jeffery [name]'s care  History of recurrent aspiration pneumonias - no clinical indication that he is having pneumonia right now, but covering with broad spectrum antibiotics. His lungs have mild rhonchi, suspect a degree of coarse breath sounds at baseline, CXR clear - resume tube feeds once more alert  OSA - on BiPAP at home, has not been using in the past month as per his mother he has been "fighting it"  Diarrhea - acute on chronic, extensively evaluated in the past - C diff negative on prior occasions, will recheck   His mother asked me about the need for a bone marrow biopsy, I will defer this to Hematology. She mentions that she would not want Jeffery [name] to go to very aggressive treatments such as bone marrow transplant, also would like to avoid tracheostomy.   Diet: Diet NPO time specified Except for: Sips with Meds, Ice Chips Fluids: NS at 75 cc/h DVT Prophylaxis: SCD  Code Status: Full Code Family Communication: d/w with his mother bedside  Disposition Plan: remain inpatient  Consultants:  Neurology   Hematology   Procedures:  None    Antibiotics Vacomycin 1/30 >> Cefepime 1/30 >>   Studies  Dg Chest Portable 1 View  03/01/2014   CLINICAL DATA:  Anemia. Chronic right lower lobe pneumonia. Neuro muscular disease.  EXAM: PORTABLE CHEST - 1 VIEW  COMPARISON:  11/06/2013  FINDINGS: Right jugular port appears unchanged in position. Spinal fixation hardware again noted.  There are low lung volumes. No airspace consolidation is evident. No large effusions are evident.  IMPRESSION: No acute findings   Electronically Signed   By: Jeffery [name] M.D.   On:  03/01/2014 20:16   Objective  Filed Vitals:   03/02/14 0600 03/02/14 0615 03/02/14 0630 03/02/14 0656  BP: 110/52  113/63 113/58  Pulse: 107 100 117 116  Temp:   98.5 F (36.9 C) 98.5 F (36.9 C)  TempSrc:   Axillary Axillary  Resp: 21 0 24 23  Height:      Weight:      SpO2: 100% 100%  100% 100%    Intake/Output Summary (Last 24 hours) at 03/02/14 0809 Last data filed at 03/02/14 0630  Gross per 24 hour  Intake 531.25 ml  Output      0 ml  Net 531.25 ml   Filed Weights   03/02/14 0145  Weight: 60.8 kg (134 lb 0.6 oz)    Exam:  General:  NAD, sleepy but intermittently opens his eyes and looks at me or his mother  HEENT: no scleral icterus   Cardiovascular: RRR, tachycardic  Respiratory: decreased breath sounds, coarse sounding without wheezing on anterior auscultation  Abdomen: soft, not appearing tender, feeding tube in place, capped, no surrounding erythema  MSK/Extremities: spasticity noted  Skin: no rashes/cellulitic changes  Neuro: non focal, significant spasticity, non verbal  Data Reviewed: Basic Metabolic Panel:  Recent Labs Lab 03/01/14 2020 03/02/14 0400  NA 138 142  K 3.7 3.8  CL 101 105  CO2 31 32  GLUCOSE 122* 94  BUN 21 15  CREATININE 0.48* 0.45*  CALCIUM 9.0 9.0   Liver Function Tests:  Recent Labs Lab 03/01/14 2020 03/02/14 0400  AST 26 20  ALT 39 34  ALKPHOS 141* 123*  BILITOT 0.8 0.8  PROT 7.2 6.1  ALBUMIN 3.9 3.2*   CBC:  Recent Labs Lab 03/01/14 2020 03/01/14 2247 03/02/14 0523  WBC 0.9*  --  1.0*  NEUTROABS 0.1*  --  0.3*  HGB 4.7*  --  4.2*  HCT 16.2*  --  13.5*  MCV 103.8*  --  100.7*  PLT 101* 86* 90*    Recent Results (from the past 240 hour(s))  MRSA PCR Screening     Status: None   Collection Time: 03/02/14  1:53 AM  Result Value Ref Range Status   MRSA by PCR NEGATIVE NEGATIVE Final    Comment:        The GeneXpert MRSA Assay (FDA approved for NASAL specimens only), is one component of a comprehensive MRSA colonization surveillance program. It is not intended to diagnose MRSA infection nor to guide or monitor treatment for MRSA infections.      Scheduled Meds: . sodium chloride   Intravenous STAT  . albuterol  2.5 mg Nebulization QID  . ceFEPime (MAXIPIME) IV  2 g  Intravenous Q8H  . feeding supplement (PRO-STAT SUGAR FREE 64)  30 mL Per Tube BID  . folic acid  1 mg Per Tube Daily  . free water  172 mL Per Tube QID  . gabapentin  900 mg Per Tube 3 times per day  . lansoprazole  30 mg Per Tube BID  . levETIRAcetam  1,750 mg Per Tube BID  . Pseudoephedrine HCl  180 mg Per Tube Q6H  . sodium chloride  3 mL Intravenous Q12H  . sucralfate  1 g Oral 4 times per day  . vancomycin  750 mg Intravenous Q8H   Continuous Infusions:   Marzetta Board, MD [facility] Pager [phone]. If 7 PM - 7 AM, please contact night-coverage at [email or web address], password [facility] 03/02/2014, 8:09 AM  LOS: 1 day

## 2014-03-02 NOTE — Progress Notes (Signed)
ANTIBIOTIC CONSULT NOTE - INITIAL  Pharmacy Consult for vancomycin and cefepime Indication: rule out sepsis  Allergies  Allergen Reactions  . Adhesive [Tape]     Rips skin off  . Depakote [Divalproex Sodium]     Causes pancreatitis   . Amoxicillin-Pot Clavulanate Rash    Patient Measurements: Height: 4\' 11"  (149.9 cm) Weight: 134 lb 0.6 oz (60.8 kg) IBW/kg (Calculated) : 47.7  Vital Signs: Temp: 98.2 F (36.8 C) (01/30 0145) Temp Source: Axillary (01/30 0145) BP: 105/48 mmHg (01/30 0230) Pulse Rate: 107 (01/30 0230)  Labs:  Recent Labs  03/01/14 2020 03/01/14 2247  WBC 0.9*  --   HGB 4.7*  --   PLT 101* 86*  CREATININE 0.48*  --    Estimated Creatinine Clearance: 107.5 mL/min (by C-G formula based on Cr of 0.48).  Medical History: Past Medical History  Diagnosis Date  . CP (cerebral palsy), spastic, quadriplegic   . Epilepsy   . Osteoporosis   . Inguinal hernia   . Undescended testes   . Seasonal allergies   . IVH (intraventricular hemorrhage)     Grade IV  . Hip dislocation, bilateral   . Seizures   . Hx: UTI (urinary tract infection)   . Dysphagia   . Otitis media   . Retinopathy of prematurity   . 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 Neuromuscular scoliosis   . Osteoporosis   . Complex partial seizures   . Generalized convulsive epilepsy without mention of intractable epilepsy   . Epilepsy   . Sinus bradycardia     HR drops to 38-40 while sleeping  . Sleep apnea     BiPAP  . Blister of right heel     fluid filled; origin unknown  . Kidney stones     ?  Marland Kitchen Pneumonia      chronic pneumonia ,respitory failure dx Augest 2014    Medications:  Facility-administered medications prior to admission  Medication Dose Route Frequency Provider Last Rate Last Dose  . botulinum toxin Type A (BOTOX) injection 300 Units  300 Units Intramuscular Once Marcial Pacas, MD       Prescriptions prior to admission  Medication Sig Dispense Refill Last Dose   . albuterol (PROVENTIL HFA;VENTOLIN HFA) 108 (90 BASE) MCG/ACT inhaler Inhale 2 puffs into the lungs every 4 (four) hours as needed for shortness of breath.   Taking  . albuterol (PROVENTIL) (2.5 MG/3ML) 0.083% nebulizer solution Take 2.5 mg by nebulization. 1 ampule twice daily and as needed (making 4 doses daily)   Taking  . Ascorbic Acid (VITAMIN C) 500 MG/5ML LIQD 300 mg by PEG Tube route at bedtime.    Taking  . baclofen (GABLOFEN) 40000 MCG/20ML SOLN 385.2 mcg by Intrathecal route continuous.    Taking  . Botulinum Toxin Type A, Cosm, (BOTOX COSMETIC) 50 UNITS SOLR Inject 300 Units into the muscle.   Taking  . calcium carbonate, dosed in mg elemental calcium, 1250 MG/5ML 1,000 mg of elemental calcium by PEG Tube route 2 (two) times daily. 4 cc via peg tube - ON HOLD UNTIL 11/17/12 PER DR. [name]   Taking  . diazepam (DIASTAT ACUDIAL) 10 MG GEL Place rectally once.     . dicyclomine (BENTYL) 10 MG/5ML syrup Place into feeding tube every 8 (eight) hours as needed. Not to exceed 5 doses in 1 wk   Taking  . diphenoxylate-atropine (LOMOTIL) 2.5-0.025 MG/5ML liquid Place into feeding tube 4 (four) times daily as needed for diarrhea or  loose stools. 1-2 tsp via feeding tube q6h prn loose stools   Taking  . docusate (COLACE) 50 MG/5ML liquid Take 50 mg by mouth daily as needed for mild constipation.   Taking  . folic acid (FOLVITE) 1 MG tablet Take 1 mg by mouth daily.   Taking  . furosemide (LASIX) 10 MG/ML solution Place 20 mg into feeding tube daily as needed.    Taking  . gabapentin (NEURONTIN) 250 MG/5ML solution Take 18 mL 3 times daily 1620 mL 5 Taking  . KEPPRA 100 MG/ML solution TAKE 17.5 ML'S BY MOUTH TWICE A DAY 1419 mL 5   . lansoprazole (PREVACID SOLUTAB) 30 MG disintegrating tablet Take 30 mg by mouth 2 (two) times daily.    Taking  . midazolam (VERSED) 5 MG/ML injection Place 2 mLs (10 mg total) into the nose once. Draw up 8ml in 2 syringes. Remove blue vial access device. Attach  syringe to nasal atomizer for intranasal administration. Give 62ml in right nostril x 2 for seizures lasting 2 minutes or longer or for repetitive seizures in a short period of time. 6 mL 3 Taking  . Multiple Vitamin (MULTIVITAMIN) LIQD 10 mLs by PEG Tube route daily.   Taking  . Multiple Vitamins-Minerals (ZINC PO) 5 mLs by PEG Tube route daily. 244 mg / 5 mL zinc solution   Taking  . mupirocin ointment (BACTROBAN) 2 % Apply 1 application topically as needed (to G-T site). 22 g 1 Taking  . Nutritional Supplements (PROMOD) LIQD Give 30 mLs by tube 2 (two) times daily.     . Nutritional Supplements (TWOCAL HN) LIQD Take 237 mLs by mouth. T.3 can at 46 cc hour x 12 hours - water 172 cc pre and post each and extra 172 bid.   Taking  . nystatin cream (MYCOSTATIN) Apply 1,093,235 application topically. 3-4 times daily   Taking  . OnabotulinumtoxinA (BOTOX IM) Inject 1 mL into the muscle every 3 (three) months. Last taken on 10-11-12   Taking  . OVER THE COUNTER MEDICATION Apply 1 application topically as needed (after diaper changes). Balmex and Bag Balm paste made and applied after diaper changes   Taking  . OXYGEN-HELIUM IN Inhale into the lungs. Oxygen PRN to keep O2 Sat at 90%   Taking  . polyethylene glycol (MIRALAX / GLYCOLAX) packet Take 17 g by mouth daily.   Taking  . Pseudoephedrine HCl (SUDAFED CHILDRENS) 15 MG/5ML LIQD Give 30 mLs by tube every 6 (six) hours.    Taking  . simethicone (MYLICON) 573 MG chewable tablet Chew 125 mg by mouth every 6 (six) hours as needed for flatulence.     . sodium phosphate (FLEET) enema Place 1 enema rectally. follow package directions PRN   Taking  . VIMPAT 10 MG/ML SOLN Give 21ml by mouth twice per day 340 mL 5 Taking  . Water For Irrigation, Sterile (FREE WATER) SOLN Place 172 mLs into feeding tube 4 (four) times daily.     Marland Kitchen zinc oxide (BALMEX) 11.3 % CREA cream Apply 1 application topically 5 (five) times daily. With each diaper change.   Taking   Scheduled:   . sodium chloride   Intravenous STAT  . sodium chloride   Intravenous Once  . albuterol  2.5 mg Nebulization QID  . feeding supplement (PRO-STAT SUGAR FREE 64)  30 mL Per Tube BID  . folic acid  1 mg Per Tube Daily  . free water  172 mL Per Tube QID  . gabapentin  900 mg Per Tube 3 times per day  . levETIRAcetam  1,750 mg Per Tube BID  . pantoprazole sodium  40 mg Per Tube BID  . Pseudoephedrine HCl  180 mg Per Tube Q6H  . sodium chloride  3 mL Intravenous Q12H    Assessment: 24yo male reported to ED after home health drew labs w/ Hgb of 5, parent reports pt has been pale and fatigued, in ED noted to be pancytopenic, admitted to evaluate for DIC w/ concern for sepsis, to begin IV ABX.  Goal of Therapy:  Vancomycin trough level 15-20 mcg/ml  Plan:  Rec'd vanc 1g at [facility]; will continue with vancomycin 750mg  IV Q8H and add cefepime 2g IV Q8H and monitor CBC, Cx, levels prn.  Wynona Neat, PharmD, BCPS  03/02/2014,3:06 AM

## 2014-03-02 NOTE — Progress Notes (Signed)
Pt guardian arrived and was hostile towards nurse. The pt guardian was upset that the special air bed had not arrived and that the pt med schedule was not similar to his home med schedule. The guardian sent a list of the pt home meds but did not include a schedule of the home meds. The guardian made a comment that if the pt gets a bedsore because the special air bed was not available, she would consult her lawyer. The pt guardian made a comment that if we did not meet her needs she would report [place] to the state and take the pt to [facility], RN

## 2014-03-02 NOTE — H&P (Addendum)
[facility] History and Physical  Patient: Jeffery [name]  MRN: [id number]  DOB: 1990/02/06  DOS: the patient was seen and examined on 03/02/2014 PCP: Marijean Bravo, MD  Chief Complaint:   HPI: [name] is a 24 y.o. male with Past medical history of cerebral palsy with spastic quadriplegia, epilepsy, intraventricular hemorrhage, recurrent aspiration pneumonia, dysphagia with PEGtube placement, chronic diarrhea. The patient is presenting with complaints of increased sleepiness. Patient had an extensive neurological history due to his cerebral palsy and has recurrent aspiration pneumonia. Patient has been more sleepy drowsy and lethargic over last 1 week. There was no fever reported. No chills no skin rash. No changes in his medication. No fall no trauma no injury reported. Patient is not taking any over-the-counter medication. His gabapentin was changed in November. His baclofen has been refilled in November. Reportedly initially patient's mother thought that he has been having subclinical seizures. There is no worsening diarrhea reported. There is no pain reported. There is no travel reported.  The patient is coming from home. And at his baseline independent for most of his ADL.  Review of Systems: as mentioned in the history of present illness.  A Comprehensive review of the other systems is negative.  Past Medical History  Diagnosis Date  . CP (cerebral palsy), spastic, quadriplegic   . Epilepsy   . Osteoporosis   . Inguinal hernia   . Undescended testes   . Seasonal allergies   . IVH (intraventricular hemorrhage)     Grade IV  . Hip dislocation, bilateral   . Seizures   . Hx: UTI (urinary tract infection)   . Dysphagia   . Otitis media   . Retinopathy of prematurity   . 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 Neuromuscular scoliosis   . Osteoporosis   . Complex partial seizures   . 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 Epilepsy   . Sinus bradycardia     HR drops to 38-40 while sleeping  . Sleep apnea     BiPAP  . Blister of right heel     fluid filled; origin unknown  . Kidney stones     ?  Marland Kitchen Pneumonia      chronic pneumonia ,respitory failure dx Augest 2014   Past Surgical History  Procedure Laterality Date  . Excision of moles    . Peg placement      With multiple changes  . Gastrostomy tube, change / reposition  12/15/2010       . Button change  12/15/2010    Procedure: BUTTON CHANGE;  Surgeon: Gatha Mayer, MD;  Location: [facility];  Service: Endoscopy;  Laterality: N/A;  . Peg placement  10/07/2011    Procedure: PERCUTANEOUS ENDOSCOPIC GASTROSTOMY (PEG) REPLACEMENT;  Surgeon: Lafayette Dragon, MD;  Location: [facility];  Service: Endoscopy;  Laterality: N/A;  Needs 18 F 2.5 button ordered-dl  . Inguinal hernia repair Bilateral 1992  . Retinopathy of prematurity surgery  1992  . Achilles tendon lengthening  12/1998  . Soft tissue releases  wrists and fingers  12/1998  . Intrathecal baclofen pump placement  07/25/2000  . Peg placement N/A 06/05/2012    Procedure: PERCUTANEOUS ENDOSCOPIC GASTROSTOMY (PEG) REPLACEMENT;  Surgeon: Lafayette Dragon, MD;  Location: [facility];  Service: Endoscopy;  Laterality: N/A;  button 17f.2.5cm  . Eye surgery Bilateral     Retinal   . Back surgery      Harrington Rods  in back needs to be log rolled  . Tendon repair Bilateral 09/05/2012    Procedure: LENGTHENING OF DIGITAL FLEXOR TENDONS BILTERAL HANDS;  Surgeon: Jolyn Nap, MD;  Location: [place];  Service: Orthopedics;  Laterality: Bilateral;  . Hand surgery Bilateral Aug. 2014  . Peg placement N/A 09/13/2012    Procedure: PERCUTANEOUS ENDOSCOPIC GASTROSTOMY (PEG) REPLACEMENT;  Surgeon: Lafayette Dragon, MD;  Location: [facility];  Service: Endoscopy;  Laterality: N/A;  . Flexible sigmoidoscopy N/A 10/30/2012    Procedure: FLEXIBLE SIGMOIDOSCOPY;  Surgeon: Cleotis Nipper, MD;  Location: [facility];  Service: Endoscopy;  Laterality: N/A;  . Peg placement N/A 11/15/2012    Procedure: PERCUTANEOUS ENDOSCOPIC GASTROSTOMY (PEG) REPLACEMENT;  Surgeon: Jeryl Columbia, MD;  Location: [facility];  Service: Endoscopy;  Laterality: N/A;   Social History:  reports that he has never smoked. He has never used smokeless tobacco. He reports that he does not drink alcohol or use illicit drugs.  Allergies  Allergen Reactions  . Adhesive [Tape]     Rips skin off  . Depakote [Divalproex Sodium]     Causes pancreatitis   . Amoxicillin-Pot Clavulanate Rash    Family History  Problem Relation Age of Onset  . Adopted: Yes    Prior to Admission medications   Medication Sig Start Date End Date Taking? Authorizing Provider  albuterol (PROVENTIL HFA;VENTOLIN HFA) 108 (90 BASE) MCG/ACT inhaler Inhale 2 puffs into the lungs every 4 (four) hours as needed for shortness of breath.   Yes Historical Provider, MD  albuterol (PROVENTIL) (2.5 MG/3ML) 0.083% nebulizer solution Take 2.5 mg by nebulization. 1 ampule twice daily and as needed (making 4 doses daily) 10/11/12  Yes Elsie Stain, MD  Ascorbic Acid (VITAMIN C) 500 MG/5ML LIQD 300 mg by PEG Tube route at bedtime.    Yes Historical Provider, MD  baclofen (GABLOFEN) 40000 MCG/20ML SOLN 385.2 mcg by Intrathecal route continuous.  05/04/12  Yes Jodi Geralds, MD  Botulinum Toxin Type A, Cosm, (BOTOX COSMETIC) 50 UNITS SOLR Inject 300 Units into the muscle.   Yes Historical Provider, MD  calcium carbonate, dosed in mg elemental calcium, 1250 MG/5ML 1,000 mg of elemental calcium by PEG Tube route 2 (two) times daily. 4 cc via peg tube - ON HOLD UNTIL 11/17/12 PER DR. [name]   Yes Historical Provider, MD  diazepam (DIASTAT ACUDIAL) 10 MG GEL Place rectally once.   Yes Historical Provider, MD  dicyclomine (BENTYL) 10 MG/5ML syrup Place into feeding tube every 8 (eight) hours as needed. Not to exceed 5 doses in 1 wk   Yes Historical Provider, MD   diphenoxylate-atropine (LOMOTIL) 2.5-0.025 MG/5ML liquid Place into feeding tube 4 (four) times daily as needed for diarrhea or loose stools. 1-2 tsp via feeding tube q6h prn loose stools   Yes Historical Provider, MD  docusate (COLACE) 50 MG/5ML liquid Take 50 mg by mouth daily as needed for mild constipation.   Yes Historical Provider, MD  folic acid (FOLVITE) 1 MG tablet Take 1 mg by mouth daily.   Yes Historical Provider, MD  furosemide (LASIX) 10 MG/ML solution Place 20 mg into feeding tube daily as needed.    Yes Historical Provider, MD  gabapentin (NEURONTIN) 250 MG/5ML solution Take 18 mL 3 times daily 12/18/13  Yes Jodi Geralds, MD  KEPPRA 100 MG/ML solution TAKE 17.5 ML'S BY MOUTH TWICE A DAY 01/14/14  Yes Rockwell Germany, NP  lansoprazole (PREVACID SOLUTAB) 30 MG disintegrating tablet Take 30 mg  by mouth 2 (two) times daily.  09/07/12  Yes Lafayette Dragon, MD  midazolam (VERSED) 5 MG/ML injection Place 2 mLs (10 mg total) into the nose once. Draw up 50m in 2 syringes. Remove blue vial access device. Attach syringe to nasal atomizer for intranasal administration. Give 144min right nostril x 2 for seizures lasting 2 minutes or longer or for repetitive seizures in a short period of time. 11/05/13  Yes TiRockwell GermanyNP  Multiple Vitamin (MULTIVITAMIN) LIQD 10 mLs by PEG Tube route daily.   Yes Historical Provider, MD  Multiple Vitamins-Minerals (ZINC PO) 5 mLs by PEG Tube route daily. 244 mg / 5 mL zinc solution   Yes Historical Provider, MD  mupirocin ointment (BACTROBAN) 2 % Apply 1 application topically as needed (to G-T site). 09/07/12  Yes DoLafayette DragonMD  Nutritional Supplements (PROMOD) LIQD Give 30 mLs by tube 2 (two) times daily.   Yes Historical Provider, MD  Nutritional Supplements (TWOCAL HN) LIQD Take 237 mLs by mouth. T.3 can at 46 cc hour x 12 hours - water 172 cc pre and post each and extra 172 bid.   Yes Historical Provider, MD  nystatin cream (MYCOSTATIN) Apply 1,1,117,356application topically. 3-4 times daily 11/29/12  Yes AlSamella ParrNP  OnabotulinumtoxinA (BOTOX IM) Inject 1 mL into the muscle every 3 (three) months. Last taken on 10-11-12   Yes Historical Provider, MD  OVER THE COUNTER MEDICATION Apply 1 application topically as needed (after diaper changes). Balmex and Bag Balm paste made and applied after diaper changes   Yes Historical Provider, MD  OXYGEN-HELIUM IN Inhale into the lungs. Oxygen PRN to keep O2 Sat at 90%   Yes Historical Provider, MD  polyethylene glycol (MIRALAX / GLYCOLAX) packet Take 17 g by mouth daily.   Yes Historical Provider, MD  Pseudoephedrine HCl (SUDAFED CHILDRENS) 15 MG/5ML LIQD Give 30 mLs by tube every 6 (six) hours.    Yes Historical Provider, MD  simethicone (MYLICON) 12701G chewable tablet Chew 125 mg by mouth every 6 (six) hours as needed for flatulence.   Yes Historical Provider, MD  sodium phosphate (FLEET) enema Place 1 enema rectally. follow package directions PRN   Yes Historical Provider, MD  VIMPAT 10 MG/ML SOLN Give 39m67my mouth twice per day 11/13/13  Yes TinRockwell GermanyP  Water For Irrigation, Sterile (FREE WATER) SOLN Place 172 mLs into feeding tube 4 (four) times daily.   Yes Historical Provider, MD  zinc oxide (BALMEX) 11.3 % CREA cream Apply 1 application topically 5 (five) times daily. With each diaper change.   Yes Historical Provider, MD    Physical Exam: Filed Vitals:   03/02/14 0145 03/02/14 0200 03/02/14 0215 03/02/14 0230  BP: 115/67 118/57 109/43 105/48  Pulse: 108 113 110 107  Temp: 98.2 F (36.8 C)     TempSrc: Axillary     Resp: 17 13 14  0  Height: 4' 11"  (1.499 m)     Weight: 60.8 kg (134 lb 0.6 oz)     SpO2: 99% 100% 100% 100%    General: Awake, nonverbal, and tenuous movement of all extremities noted. Appear in mild distress Eyes: PERRL ENT: Oral Mucosa clear moist. Neck: no JVD Cardiovascular: S1 and S2 Present, no Murmur, Peripheral Pulses Present Respiratory: Bilateral  Air entry equal and Decreased, bilateral rhonchi and Crackles, no wheezes Abdomen: Bowel Sound present, Soft and non tender Skin: n Rash Extremities: no Pedal edema, no calf tenderness Neurologic: Grossly  no focal neuro deficit.  Labs on Admission:  CBC:  Recent Labs Lab 03/01/14 2020 03/01/14 2247 03/02/14 0523  WBC 0.9*  --  1.0*  NEUTROABS 0.1*  --  PENDING  HGB 4.7*  --  4.2*  HCT 16.2*  --  13.5*  MCV 103.8*  --  100.7*  PLT 101* 86* 90*    CMP     Component Value Date/Time   NA 142 03/02/2014 0400   K 3.8 03/02/2014 0400   CL 105 03/02/2014 0400   CO2 32 03/02/2014 0400   GLUCOSE 94 03/02/2014 0400   BUN 15 03/02/2014 0400   CREATININE 0.45* 03/02/2014 0400   CALCIUM 9.0 03/02/2014 0400   PROT 6.1 03/02/2014 0400   ALBUMIN 3.2* 03/02/2014 0400   AST 20 03/02/2014 0400   ALT 34 03/02/2014 0400   ALKPHOS 123* 03/02/2014 0400   BILITOT 0.8 03/02/2014 0400   GFRNONAA >90 03/02/2014 0400   GFRAA >90 03/02/2014 0400    No results for input(s): LIPASE, AMYLASE in the last 168 hours.  No results for input(s): CKTOTAL, CKMB, CKMBINDEX, TROPONINI in the last 168 hours. BNP (last 3 results) No results for input(s): PROBNP in the last 8760 hours.  Radiological Exams on Admission: Dg Chest Portable 1 View  03/01/2014   CLINICAL DATA:  Anemia. Chronic right lower lobe pneumonia. Neuro muscular disease.  EXAM: PORTABLE CHEST - 1 VIEW  COMPARISON:  11/06/2013  FINDINGS: Right jugular port appears unchanged in position. Spinal fixation hardware again noted.  There are low lung volumes. No airspace consolidation is evident. No large effusions are evident.  IMPRESSION: No acute findings   Electronically Signed   By: Andreas Newport M.D.   On: 03/01/2014 20:16    Assessment/Plan Principal Problem:   Pancytopenia Active Problems:   Congenital spastic quadriplegia   Dysphagia   Neuromuscular scoliosis   Complex partial seizures   OSA on CPAP   Aspiration  pneumonia   1. Pancytopenia The patient is presenting with complaints of increased sleepiness and lethargy. There is no change in his medication reported recently. He was found to have significantly pancytopenic with neutropenia and severe anemia. With this he does not have any active bleeding or black color bowel movement reported. Currently the patient is admitted in the step down unit. DIC panel is negative. DS does not show any evidence of acute hemodialysis. no schistocytes on the peripheral smear. I discussed the case with hematology on call who recommends further blood work and supportive management at present. If patient does not have any improvement in his counts then they will attempt a bone marrow biopsy. At present no formal consultation.  2. Aspiration pneumonia. Patient has recurrent aspiration pneumonia in the past. With increasing lethargy and weakness and bilateral rhonchi as well as crackles with pancytopenia I'm empirically covering him fit vancomycin and cefepime. Cefepime and is associated with neurological disorders as well as pancytopenia and therefore need to be monitored closely. Follow cultures.  3. Obstructive sleep apnea. Patient has been on BiPAP at home. Will continue the same. Venous blood gas does not show any evidence of significant hypercarbia or acidosis.  4. Cerebral palsy with spastic quadriplegia. Continuing home medications.  5. Epileptic disorder. Patient is on multiple epileptic medication which can cause cytopenia. At present continuing the same medication. Getting an EEG. May require neurologic consultation if the patient's mentation does not improve with blood transfusion.  Advance goals of care discussion: Full code as per my discussion with patient's  mother, patient does not want tracheostomy   Consults: Hematology phone consultation  DVT Prophylaxis: mechanical compression device Nutrition: npo, patient is taking promod and  twocal which can be resumed if there is no active bleeding.  Family Communication: father was present at bedside, opportunity was given to ask question and all questions were answered satisfactorily at the time of interview. Disposition: Admitted to inpatient i step-down nit.  Author: Berle Mull, MD [facility] Pager: [phone] 03/02/2014, 6:23 AM    If 7PM-7AM, please contact night-coverage [email or web address] Password [email or web address]

## 2014-03-02 NOTE — Progress Notes (Signed)
Primary Nurse, Debby Freiberg RN, called the writer and stated that patient's mother wants to talk to the charge nurse. Primary nurse did state that Mrs. Theys said, "I don't want to talk to that Asian Nurse."  Martin Majestic to patient's room and when the mother saw me, she told me, "I don't want to talk to you. You need to get out because of my previous experience when we were here."  Patient was reassigned to Heidi Dach, RN because the patient's mother did not want Ms. Narigon to take care of the patient.  I left the room and called the [facility], Sherle Poe, and made him aware of this complaint and statement.  AC came to the unit but patient's mother is on the phone.  AC left and informed the nurse that he was here.

## 2014-03-02 NOTE — Progress Notes (Signed)
INITIAL NUTRITION ASSESSMENT  DOCUMENTATION CODES Per approved criteria  -Not Applicable   INTERVENTION: 1. Obtained pt's home tf regimen, ordered non formulary as mom has brought in TFs: 2cal HN 45 cc/hr for 12 hours, Prostat 30 mls BID  NUTRITION DIAGNOSIS: n/a  Goal: Pt to meet >/= 90% of their estimated nutrition needs    Monitor:  Tf order/initiation. TF tolerance  Reason for Assessment:  Obtaining and ordering home tf regimen  24 y.o. male  Admitting Dx: Pancytopenia  ASSESSMENT: 24 y.o. male with Past medical history of cerebral palsy with spastic quadriplegia, epilepsy, intraventricular hemorrhage, recurrent aspiration pneumonia, dysphagia with PEGtube placement, chronic diarrhea.  Height: Ht Readings from Last 1 Encounters:  03/02/14 4\' 11"  (1.499 m)    Weight: Wt Readings from Last 1 Encounters:  03/02/14 134 lb 0.6 oz (60.8 kg)    Ideal Body Weight: 100 lbs   % Ideal Body Weight: 134%  Wt Readings from Last 10 Encounters:  03/02/14 134 lb 0.6 oz (60.8 kg)  12/12/13 128 lb (58.06 kg)  04/17/13 124 lb (56.246 kg)  12/25/12 116 lb (52.617 kg)  11/29/12 116 lb 2.9 oz (52.7 kg)  11/07/12 116 lb (52.617 kg)  10/30/12 116 lb (52.617 kg)  10/05/12 116 lb (52.617 kg)  09/22/12 105 lb 13.1 oz (48 kg)  09/13/12 120 lb (54.432 kg)    Usual Body Weight 119-120 lbs  % Usual Body Weight: 111%  BMI:  Body mass index is 27.06 kg/(m^2).  Estimated Nutritional Needs: Kcal: 1000-1200 Protein: 61 Fluid: 1000-1200  Skin: WDL Diet Order: Diet NPO time specified Except for: Sips with Meds, Ice Chips  EDUCATION NEEDS: -No education needs identified at this time   Intake/Output Summary (Last 24 hours) at 03/02/14 0912 Last data filed at 03/02/14 0630  Gross per 24 hour  Intake 531.25 ml  Output      0 ml  Net 531.25 ml    Last BM: 1/28   Labs:   Recent Labs Lab 03/01/14 2020 03/02/14 0400  NA 138 142  K 3.7 3.8  CL 101 105  CO2 31 32   BUN 21 15  CREATININE 0.48* 0.45*  CALCIUM 9.0 9.0  GLUCOSE 122* 94    CBG (last 3)  No results for input(s): GLUCAP in the last 72 hours.  Scheduled Meds: . sodium chloride   Intravenous STAT  . albuterol  2.5 mg Nebulization QID  . ceFEPime (MAXIPIME) IV  2 g Intravenous Q8H  . feeding supplement (PRO-STAT SUGAR FREE 64)  30 mL Per Tube BID  . folic acid  1 mg Per Tube Daily  . free water  172 mL Per Tube QID  . gabapentin  900 mg Per Tube 3 times per day  . lacosamide (VIMPAT) IV  50 mg Intravenous Q12H  . lansoprazole  30 mg Per Tube BID  . levETIRAcetam  1,750 mg Per Tube BID  . levETIRAcetam  1,750 mg Oral Q12H  . Pseudoephedrine HCl  180 mg Per Tube Q6H  . sodium chloride  3 mL Intravenous Q12H  . sucralfate  1 g Oral 4 times per day  . vancomycin  750 mg Intravenous Q8H    Continuous Infusions:   Past Medical History  Diagnosis Date  . CP (cerebral palsy), spastic, quadriplegic   . Epilepsy   . Osteoporosis   . Inguinal hernia   . Undescended testes   . Seasonal allergies   . IVH (intraventricular hemorrhage)     Grade IV  .  Hip dislocation, bilateral   . Seizures   . Hx: UTI (urinary tract infection)   . Dysphagia   . Otitis media   . Retinopathy of prematurity   . 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 Neuromuscular scoliosis   . Osteoporosis   . Complex partial seizures   . Generalized convulsive epilepsy without mention of intractable epilepsy   . Epilepsy   . Sinus bradycardia     HR drops to 38-40 while sleeping  . Sleep apnea     BiPAP  . Blister of right heel     fluid filled; origin unknown  . Kidney stones     ?  Marland Kitchen Pneumonia      chronic pneumonia ,respitory failure dx Augest 2014    Past Surgical History  Procedure Laterality Date  . Excision of moles    . Peg placement      With multiple changes  . Gastrostomy tube, change / reposition  12/15/2010       . Button change  12/15/2010    Procedure: BUTTON CHANGE;  Surgeon:  Gatha Mayer, MD;  Location: [facility];  Service: Endoscopy;  Laterality: N/A;  . Peg placement  10/07/2011    Procedure: PERCUTANEOUS ENDOSCOPIC GASTROSTOMY (PEG) REPLACEMENT;  Surgeon: Lafayette Dragon, MD;  Location: [facility];  Service: Endoscopy;  Laterality: N/A;  Needs 18 F 2.5 button ordered-dl  . Inguinal hernia repair Bilateral 1992  . Retinopathy of prematurity surgery  1992  . Achilles tendon lengthening  12/1998  . Soft tissue releases  wrists and fingers  12/1998  . Intrathecal baclofen pump placement  07/25/2000  . Peg placement N/A 06/05/2012    Procedure: PERCUTANEOUS ENDOSCOPIC GASTROSTOMY (PEG) REPLACEMENT;  Surgeon: Lafayette Dragon, MD;  Location: [facility];  Service: Endoscopy;  Laterality: N/A;  button 52fr.2.5cm  . Eye surgery Bilateral     Retinal   . Back surgery      Harrington Rods in back needs to be log rolled  . Tendon repair Bilateral 09/05/2012    Procedure: LENGTHENING OF DIGITAL FLEXOR TENDONS BILTERAL HANDS;  Surgeon: Jolyn Nap, MD;  Location: [facility];  Service: Orthopedics;  Laterality: Bilateral;  . Hand surgery Bilateral Aug. 2014  . Peg placement N/A 09/13/2012    Procedure: PERCUTANEOUS ENDOSCOPIC GASTROSTOMY (PEG) REPLACEMENT;  Surgeon: Lafayette Dragon, MD;  Location: [facility];  Service: Endoscopy;  Laterality: N/A;  . Flexible sigmoidoscopy N/A 10/30/2012    Procedure: FLEXIBLE SIGMOIDOSCOPY;  Surgeon: Cleotis Nipper, MD;  Location: [facility];  Service: Endoscopy;  Laterality: N/A;  . Peg placement N/A 11/15/2012    Procedure: PERCUTANEOUS ENDOSCOPIC GASTROSTOMY (PEG) REPLACEMENT;  Surgeon: Jeryl Columbia, MD;  Location: [facility];  Service: Endoscopy;  Laterality: N/ABurtis Junes RD, LDN Nutrition [phone] 03/02/2014 9:12 AM

## 2014-03-02 NOTE — Progress Notes (Signed)
Attempted to give pt medications that were scheduled to be given.  CG very rude with this RN and informed me that I "was not going to give him any medications until he is up and in his chair."  Advised that the pt was getting blood and that it would be in pt's best interest for him to remain in the bed and that we could put the bed into a sitting position. CG responded, "Don't get me started on this bed.  He was to have an air mattress before we even arrived and it's not here. You will not put him in a sitting position because it will make his bottom red.  I will file a formal complaint if his bottom becomes red."  I advised the CG that the new bed had been requested and ordered and when it arrived we would move the pt to the new bed.  I left the room at this time as resp therapy was in there to give breathing treatment, pharm tech was in there to get updated list of meds, and neurologist had arrived to room.

## 2014-03-03 LAB — CBC WITH DIFFERENTIAL/PLATELET
Basophils Absolute: 0 10*3/uL (ref 0.0–0.1)
Basophils Relative: 0 % (ref 0–1)
EOS ABS: 0.1 10*3/uL (ref 0.0–0.7)
Eosinophils Relative: 5 % (ref 0–5)
HCT: 23.7 % — ABNORMAL LOW (ref 39.0–52.0)
Hemoglobin: 7.6 g/dL — ABNORMAL LOW (ref 13.0–17.0)
Lymphocytes Relative: 42 % (ref 12–46)
Lymphs Abs: 0.4 10*3/uL — ABNORMAL LOW (ref 0.7–4.0)
MCH: 30 pg (ref 26.0–34.0)
MCHC: 32.1 g/dL (ref 30.0–36.0)
MCV: 93.7 fL (ref 78.0–100.0)
MONO ABS: 0.4 10*3/uL (ref 0.1–1.0)
Monocytes Relative: 31 % — ABNORMAL HIGH (ref 3–12)
Neutro Abs: 0.3 10*3/uL — ABNORMAL LOW (ref 1.7–7.7)
Neutrophils Relative %: 22 % — ABNORMAL LOW (ref 43–77)
Platelets: 110 10*3/uL — ABNORMAL LOW (ref 150–400)
RBC: 2.53 MIL/uL — ABNORMAL LOW (ref 4.22–5.81)
RDW: 16.7 % — AB (ref 11.5–15.5)
WBC: 1.2 10*3/uL — AB (ref 4.0–10.5)

## 2014-03-03 LAB — COMPREHENSIVE METABOLIC PANEL
ALBUMIN: 3.3 g/dL — AB (ref 3.5–5.2)
ALK PHOS: 125 U/L — AB (ref 39–117)
ALT: 31 U/L (ref 0–53)
AST: 19 U/L (ref 0–37)
Anion gap: 5 (ref 5–15)
BILIRUBIN TOTAL: 1.4 mg/dL — AB (ref 0.3–1.2)
BUN: 12 mg/dL (ref 6–23)
CALCIUM: 8.8 mg/dL (ref 8.4–10.5)
CHLORIDE: 107 mmol/L (ref 96–112)
CO2: 32 mmol/L (ref 19–32)
CREATININE: 0.52 mg/dL (ref 0.50–1.35)
GFR calc Af Amer: >90 mL/min (ref >90)
Glucose, Bld: 96 mg/dL (ref 70–99)
POTASSIUM: 3.5 mmol/L (ref 3.5–5.1)
Sodium: 144 mmol/L (ref 135–145)
Total Protein: 6.3 g/dL (ref 6.0–8.3)

## 2014-03-03 LAB — MAGNESIUM: MAGNESIUM: 2 mg/dL (ref 1.5–2.5)

## 2014-03-03 LAB — PHOSPHORUS: PHOSPHORUS: 4.3 mg/dL (ref 2.3–4.6)

## 2014-03-03 LAB — PREPARE RBC (CROSSMATCH)

## 2014-03-03 LAB — VANCOMYCIN, TROUGH: Vancomycin Tr: 12.7 ug/mL (ref 10.0–20.0)

## 2014-03-03 LAB — APTT: APTT: 29 s (ref 24–37)

## 2014-03-03 LAB — PROTIME-INR
INR: 1.08 (ref 0.00–1.49)
Prothrombin Time: 14.2 seconds (ref 11.6–15.2)

## 2014-03-03 LAB — HAPTOGLOBIN: Haptoglobin: 193 mg/dL (ref 34–200)

## 2014-03-03 MED ORDER — PANTOPRAZOLE SODIUM 40 MG PO PACK
40.0000 mg | PACK | Freq: Two times a day (BID) | ORAL | Status: DC
  Administered 2014-03-03 – 2014-03-04 (×3): 40 mg
  Filled 2014-03-03 (×2): qty 20

## 2014-03-03 MED ORDER — POTASSIUM CHLORIDE 20 MEQ PO PACK
20.0000 meq | PACK | Freq: Every day | ORAL | Status: DC
  Administered 2014-03-03: 20 meq via ORAL
  Filled 2014-03-03 (×2): qty 1

## 2014-03-03 MED ORDER — POTASSIUM CHLORIDE 20 MEQ PO PACK
20.0000 meq | PACK | Freq: Every day | ORAL | Status: DC
  Filled 2014-03-03: qty 1

## 2014-03-03 MED ORDER — SODIUM CHLORIDE 0.9 % IJ SOLN
10.0000 mL | INTRAMUSCULAR | Status: DC | PRN
  Administered 2014-03-04: 30 mL
  Filled 2014-03-03: qty 40

## 2014-03-03 MED ORDER — ACETAMINOPHEN 160 MG/5ML PO SOLN
650.0000 mg | Freq: Four times a day (QID) | ORAL | Status: DC
  Administered 2014-03-03 – 2014-03-04 (×4): 650 mg
  Filled 2014-03-03 (×4): qty 20.3

## 2014-03-03 MED ORDER — POTASSIUM CHLORIDE CRYS ER 20 MEQ PO TBCR: 20.0000 meq | EXTENDED_RELEASE_TABLET | Freq: Every day | ORAL | Status: DC

## 2014-03-03 MED ORDER — SODIUM CHLORIDE 0.9 % IV SOLN
Freq: Once | INTRAVENOUS | Status: AC
  Administered 2014-03-03: 10 mL/h via INTRAVENOUS

## 2014-03-03 MED ORDER — CALCIUM CARBONATE 1250 MG/5ML PO SUSP
500.0000 mg | Freq: Three times a day (TID) | ORAL | Status: DC
  Administered 2014-03-03 – 2014-03-04 (×2): 500 mg
  Filled 2014-03-03 (×5): qty 5

## 2014-03-03 MED ORDER — CALCIUM CARBONATE 1250 MG/5ML PO SUSP
500.0000 mg | Freq: Every day | ORAL | Status: DC
  Administered 2014-03-03: 500 mg via ORAL
  Filled 2014-03-03 (×2): qty 5

## 2014-03-03 MED ORDER — DIPHENOXYLATE-ATROPINE 2.5-0.025 MG/5ML PO LIQD
10.0000 mL | Freq: Four times a day (QID) | ORAL | Status: DC | PRN
  Administered 2014-03-03 – 2014-03-04 (×4): 10 mL
  Filled 2014-03-03 (×4): qty 10

## 2014-03-03 MED ORDER — NYSTATIN 100000 UNIT/GM EX OINT
TOPICAL_OINTMENT | Freq: Two times a day (BID) | CUTANEOUS | Status: DC
  Administered 2014-03-03: 1 via TOPICAL
  Filled 2014-03-03: qty 15

## 2014-03-03 MED ORDER — ACETAMINOPHEN 160 MG/5ML PO SOLN
650.0000 mg | Freq: Four times a day (QID) | ORAL | Status: DC | PRN
  Administered 2014-03-03: 650 mg
  Filled 2014-03-03: qty 20.3

## 2014-03-03 MED ORDER — PSEUDOEPHEDRINE HCL 30 MG/5ML PO LIQD
90.0000 mg | Freq: Four times a day (QID) | ORAL | Status: DC
  Administered 2014-03-03 – 2014-03-04 (×4): 90 mg
  Filled 2014-03-03 (×11): qty 15

## 2014-03-03 MED ORDER — MICONAZOLE NITRATE 2 % EX CREA
TOPICAL_CREAM | Freq: Two times a day (BID) | CUTANEOUS | Status: DC
  Administered 2014-03-03: 1 via TOPICAL
  Filled 2014-03-03: qty 14

## 2014-03-03 NOTE — Progress Notes (Signed)
Subjective: Mother feels taht he is at his baseline. No further episodes.   Exam: Filed Vitals:   03/03/14 1604  BP: 136/88  Pulse:   Temp: 97.4 F (36.3 C)  Resp:    Gen: In chair.  Spastic quadriparesis.     Impression: 24 year old male with seizures secondary to intraventricular hemorrhage with resultant cerebral palsy. I suspect that the recent episodes have been more due to hypoperfusion due to his low hemoglobin rather than actual breakthrough seizures. He is currently being transfused, if the episodes continue on Monday  then could consider continuous EEG.  If hematology feels that AEDs are most likley cause of his pancytopenia, levetiracetam has been implicated, but always soon after starting that I was able to find. Anemia and agranulocytosis are listed in teh prescription information with lacosamide, but this is certainly not common. Lacosamide was the most recently added 8 months ago.   Recommendations: 1)continue AEDs pending evaluation.   Roland Rack, MD [facility] [phone]  If 7pm- 7am, please page neurology on call as listed in [place].

## 2014-03-03 NOTE — Progress Notes (Signed)
PROGRESS NOTE  Jeffery [name] UUV:[id number] DOB: 1990-06-05 DOA: 03/01/2014 PCP: Marijean Bravo, MD  HPI: Patient seen and examined this morning, just admitted by Dr. Posey Pronto. Has a history of spastic quadriparesis on Baclofen pump, seizure disorder in the setting of premature delivery at 25 weeks c/b grade 4 intraventricular hemorrhage (primary Neurologist is Dr. Gaynell Face) as well as recurrent hospitalizations for aspiration pneumonias, admitted 1/29-1/30 night with new onset pancytopenia. He has been more lethargic over the past week and his mother also felt like he is having subclinical seizures. She reports no fevers/chills, no blood in his stool or urine, no dark stools. No problems with his feeds, he has ongoing chronic diarrhea.  Subjective / 24 H Interval events - Jeffery [name] appears better this morning, smiling, father bedside asking when he could be discharged.   Assessment/Plan: Principal Problem:   Pancytopenia Active Problems:   Congenital spastic quadriplegia   Dysphagia   Neuromuscular scoliosis   Complex partial seizures   OSA on CPAP   Aspiration pneumonia     Pancytopenia - workup underway, DIC panel is unremarkable - no schistocytes on smear - no blasts on smear - continue neutropenic precautions, WBC with slight improvement this morning, ANC unchanged - transfused 2U pRBC 1/30, Hemoglobin 7.6 afterwards, stable 1/31 am - mild macrocytosis, B12 normal at 704, MMA pending - DAT negative - HIV, parvovirus pending - haptoglobin pending - LDH normal at 95, lactic acid mildly elevated at 2.66 - Dr. Whitney Muse from hematology following, appreciate further input - afebrile, without respiratory symptoms, CXR unremarkable, low suspicion for infection for now, cultures obtained, started on broad spectrum antibiotics on admission 1/30, which can be discontinued at 48 - 72 h if cultures negative - his mother declines a bone marrow biopsy as per hematology notes, will try to  obtain flow cytometry, apparently this cannot be drawn over the weekend since it needs special handling, will obtain Monday morning  CP with spastic quadriplegia and seizure disorder - ?sublinical seizures, possible in the setting of profound anemia - Dr. Leonel Ramsay from Neurology following, appreciate his input  History of recurrent aspiration pneumonias - no clinical indication that he is having pneumonia right now, but covering with broad spectrum antibiotics. His lungs have mild rhonchi, suspect a degree of coarse breath sounds at baseline, CXR clear - respiratory status stable this morning, resumed his tube feeds last night without complications,  OSA - on BiPAP at home, has not been using in the past month as per his mother he has been "fighting it" - declines using it here as well  Diarrhea - acute on chronic, extensively evaluated in the past - C diff negative, Lomotil prn  Inguinal rash - started diflucan 1/30, nystatin ointment   Diet: Diet NPO time specified Except for: Sips with Meds, Ice Chips Fluids: NS at 75 cc/h DVT Prophylaxis: SCD  Code Status: Full Code Family Communication: d/w with his father mother bedside  Disposition Plan: remain inpatient  Consultants:  Neurology   Hematology   Procedures:  None    Antibiotics Vacomycin 1/30 >> Cefepime 1/30 >>   Studies  Dg Chest Portable 1 View  03/01/2014   CLINICAL DATA:  Anemia. Chronic right lower lobe pneumonia. Neuro muscular disease.  EXAM: PORTABLE CHEST - 1 VIEW  COMPARISON:  11/06/2013  FINDINGS: Right jugular port appears unchanged in position. Spinal fixation hardware again noted.  There are low lung volumes. No airspace consolidation is evident. No large effusions are evident.  IMPRESSION: No  acute findings   Electronically Signed   By: Andreas Newport M.D.   On: 03/01/2014 20:16   Objective  Filed Vitals:   03/02/14 2117 03/02/14 2221 03/02/14 2300 03/03/14 0448  BP:  115/88  111/83    Pulse:  106  112  Temp:  97.4 F (36.3 C)    TempSrc:  Axillary    Resp:  17  23  Height:      Weight:      SpO2: 98% 94% 97% 95%    Intake/Output Summary (Last 24 hours) at 03/03/14 4650 Last data filed at 03/02/14 1703  Gross per 24 hour  Intake 1096.67 ml  Output      0 ml  Net 1096.67 ml   Filed Weights   03/02/14 0145  Weight: 60.8 kg (134 lb 0.6 oz)    Exam:  General:  NAD, alert smiling, non verbal  HEENT: no scleral icterus   Cardiovascular: RRR, tachycardic  Respiratory: decreased breath sounds, coarse sounding without wheezing  Abdomen: soft, not appearing tender, feeding tube in place, capped, no surrounding erythema  MSK/Extremities: spasticity noted  Skin: mild inguinal rash bilaterally   Neuro: non focal, significant spasticity, non verbal   Data Reviewed: Basic Metabolic Panel:  Recent Labs Lab 03/01/14 2020 03/02/14 0400 03/03/14 0430  NA 138 142 144  K 3.7 3.8 3.5  CL 101 105 107  CO2 31 32 32  GLUCOSE 122* 94 96  BUN _0 CREATININE 0.48* 0.45* 0.52  CALCIUM 9.0 9.0 8.8  MG  --   --  2.0  PHOS  --   --  4.3   Liver Function Tests:  Recent Labs Lab 03/01/14 2020 03/02/14 0400 03/03/14 0430  AST _1 ALT 39 34 31  ALKPHOS 141* 123* 125*  BILITOT 0.8 0.8 1.4*  PROT 7.2 6.1 6.3  ALBUMIN 3.9 3.2* 3.3*   CBC:  Recent Labs Lab 03/01/14 2020 03/01/14 2247 03/02/14 0523 03/02/14 1400 03/03/14 0430  WBC 0.9*  --  1.0* 0.8* 1.2*  NEUTROABS 0.1*  --  0.3*  --  PENDING  HGB 4.7*  --  4.2* 7.6* 7.6*  HCT 16.2*  --  13.5* 22.7* 23.7*  MCV 103.8*  --  100.7* 91.2 93.7  PLT 101* 86* 90* 90* 110*    Recent Results (from the past 240 hour(s))  Blood culture (routine x 2)     Status: None (Preliminary result)   Collection Time: 03/01/14  8:20 PM  Result Value Ref Range Status   Specimen Description BLOOD RIGHT PORTA CART  Final   Special Requests BOTTLES DRAWN AEROBIC AND ANAEROBIC 6CC EACH  Final   Culture    Final           BLOOD CULTURE RECEIVED NO GROWTH TO DATE CULTURE WILL BE HELD FOR 5 DAYS BEFORE ISSUING A FINAL NEGATIVE REPORT Performed at [facility]    Report Status PENDING  Incomplete  MRSA PCR Screening     Status: None   Collection Time: 03/02/14  1:53 AM  Result Value Ref Range Status   MRSA by PCR NEGATIVE NEGATIVE Final    Comment:        The GeneXpert MRSA Assay (FDA approved for NASAL specimens only), is one component of a comprehensive MRSA colonization surveillance program. It is not intended to diagnose MRSA infection nor to guide or monitor treatment for MRSA infections.   Clostridium Difficile by PCR     Status: None  Collection Time: 03/02/14  2:47 PM  Result Value Ref Range Status   C difficile by pcr NEGATIVE NEGATIVE Final     Scheduled Meds: . albuterol  2.5 mg Nebulization QID  . ceFEPime (MAXIPIME) IV  2 g Intravenous Q8H  . feeding supplement (PRO-STAT SUGAR FREE 64)  30 mL Per Tube BID  . fluconazole  100 mg Oral Daily  . folic acid  1 mg Per Tube Daily  . free water  172 mL Per Tube QID  . gabapentin  900 mg Per Tube 3 times per day  . lacosamide (VIMPAT) IV  50 mg Intravenous Q12H  . lansoprazole  30 mg Per Tube BID  . levETIRAcetam  1,750 mg Per Tube BID  . Pseudoephedrine HCl  180 mg Per Tube Q6H  . sodium chloride  3 mL Intravenous Q12H  . sucralfate  1 g Oral 4 times per day  . TWOCAL HN  237 mL Per Tube Q24H  . TWOCAL HN  237 mL Per Tube QAC lunch  . TWOCAL HN  90 mL Per Tube QPC supper  . vancomycin  750 mg Intravenous Q8H   Continuous Infusions:   Time spent: 50 minutes, more than 50% in family discussions bedside  Costin Gherghe, MD [facility] Pager [phone]. If 7 PM - 7 AM, please contact night-coverage at [email or web address], password [email or web address] 03/03/2014, 7:12 AM  LOS: 2 days  

## 2014-03-03 NOTE — Progress Notes (Signed)
Utilization review completed.  

## 2014-03-03 NOTE — Progress Notes (Signed)
Pt is awake in bed. His relative asked me not to put BIPAP on pt tonight as he is receiving medication, coughing a lot, and the machine may make him anxious. No machine in the room.

## 2014-03-03 NOTE — Progress Notes (Signed)
Jeffery [name]   DOB:25-Apr-1990   UD#:[id number]    Subjective: No changes. Met step-father this am. Flow cytometry on peripheral blood not able to be done until tomorrow am.  No fevers, no difficulties with TF. No other complaints. Bilirubin elevated today, LDH was normal yesterday, haptoglobin still pending. Tmax of 99.9 axillary on 1/30.   Objective:  Filed Vitals:   03/03/14 0448  BP: 111/83  Pulse: 112  Temp:   Resp: 23     Intake/Output Summary (Last 24 hours) at 03/03/14 1118 Last data filed at 03/02/14 1703  Gross per 24 hour  Intake 586.67 ml  Output      0 ml  Net 586.67 ml    General appearance: cooperative, no distress and slowed mentation, Up in chair today Head: Normocephalic, without obvious abnormality, atraumatic Neck: no adenopathy, no carotid bruit, no JVD, supple, symmetrical, trachea midline and thyroid not enlarged, symmetric, no tenderness/mass/nodules Lungs: rhonchi bilaterally Heart: S1, S2 normal Abdomen: soft, non-tender; bowel sounds normal; no masses, no organomegaly Skin: Skin color, texture, turgor normal. No rashes or lesions Lymph nodes: Cervical, supraclavicular, and axillary nodes normal. Neurologic: Motor: spasticity upper and lower extemities   Labs:  Lab Results  Component Value Date   WBC 1.2* 03/03/2014   HGB 7.6* 03/03/2014   HCT 23.7* 03/03/2014   MCV 93.7 03/03/2014   PLT 110* 03/03/2014   NEUTROABS 0.3* 03/03/2014    Lab Results  Component Value Date   NA 144 03/03/2014   K 3.5 03/03/2014   CL 107 03/03/2014   CO2 32 03/03/2014   Sedimentation rate  Status: Finalresult Visible to patient:  Not Released Nextappt: 03/13/2014 at 01:00 PM in Neurology Marcial Pacas, MD)            Ref Range 1d ago    Sed Rate 0 - 16 mm/hr 40 (H)   Resulting Agency [facility]       C-reactive protein  Status: Finalresult Visible to patient:  Not Released Nextappt: 03/13/2014 at 01:00 PM in Neurology  Marcial Pacas, MD)            Ref Range 1d ago    CRP <0.60 mg/dL 1.5 (H)   Comments: Performed at [facility] [facility]  Status: Finalresult Visible to patient:  Not Released Nextappt: 03/13/2014 at 01:00 PM in Neurology Marcial Pacas, MD)            Ref Range 2d ago    Retic Ct Pct 0.4 - 3.1 % 0.9   RBC. 4.22 - 5.81 MIL/uL 1.56 (L)   Retic Count, Manual 19.0 - 186.0 K/uL 14.0 (L)        HIV  pending Parvovirus  pending Haptoglobin  pending   Studies:  Dg Chest Portable 1 View  03/01/2014   CLINICAL DATA:  Anemia. Chronic right lower lobe pneumonia. Neuro muscular disease.  EXAM: PORTABLE CHEST - 1 VIEW  COMPARISON:  11/06/2013  FINDINGS: Right jugular port appears unchanged in position. Spinal fixation hardware again noted.  There are low lung volumes. No airspace consolidation is evident. No large effusions are evident.  IMPRESSION: No acute findings   Electronically Signed   By: Andreas Newport M.D.   On: 03/01/2014 20:16    Assessment & Plan:  New onset pancytopenia with severe anemia and leukopenia/neutropenia. Patient with normal CBC on 04/17/2013 except for mild thrombocytopenia which is chronic Peripheral smear without obvious cause -- large platelets noted, no schistocytes or abnormal wbc population Elevated CRP,  ESR Bilirubin elevated at 1.4 mg/dl today, haptoglobin pending, LDH normal and DAT normal   Will await results of peripheral flow cytometry. To draw in am. Patient does not need to stay inpatient until results return.  It can be followed up as an outpatient. Would transfuse with additional PRBC in am prior to d/c if CBC any lower in am.   Of course if he has a temp spike will need to stay in house on ABX, would give neulasta at that point. Currently on vanc/cefepime. Blood cultures all neg thus far.  If we conclude most likely cause of pancytopenia may be his multiple anti-seizure medications, will ask neurology  to make recommendations. Again, family refusing BMBX.  Molli Hazard, MD 03/03/2014  11:18 AM

## 2014-03-03 NOTE — Progress Notes (Signed)
ANTIBIOTIC CONSULT NOTE - FOLLOW UP  Pharmacy Consult for Vancomycin Indication: rule out sepsis  Allergies  Allergen Reactions  . Adhesive [Tape]     Rips skin off  . Depakote [Divalproex Sodium]     Causes pancreatitis   . Amoxicillin-Pot Clavulanate Rash    Patient Measurements: Height: 4\' 11"  (149.9 cm) Weight: 134 lb 0.6 oz (60.8 kg) IBW/kg (Calculated) : 47.7 Adjusted Body Weight:   Vital Signs: Temp: 97.4 F (36.3 C) (01/31 1604) BP: 136/88 mmHg (01/31 1604) Pulse Rate: 106 (01/31 1512) Intake/Output from previous day: 01/30 0701 - 01/31 0700 In: 1096.7 [Blood:716.7; IV Piggyback:380] Out: -  Intake/Output from this shift: Total I/O In: 250 [I.V.:250] Out: -   Labs:  Recent Labs  03/01/14 2020  03/02/14 0400 03/02/14 0523 03/02/14 1400 03/03/14 0430  WBC 0.9*  --   --  1.0* 0.8* 1.2*  HGB 4.7*  --   --  4.2* 7.6* 7.6*  PLT 101*  < >  --  90* 90* 110*  CREATININE 0.48*  --  0.45*  --   --  0.52  < > = values in this interval not displayed. Estimated Creatinine Clearance: 107.5 mL/min (by C-G formula based on Cr of 0.52).  Recent Labs  03/03/14 1720  VANCOTROUGH 12.7     Microbiology: Recent Results (from the past 720 hour(s))  Blood culture (routine x 2)     Status: None (Preliminary result)   Collection Time: 03/01/14  8:20 PM  Result Value Ref Range Status   Specimen Description BLOOD RIGHT PORTA CART  Final   Special Requests BOTTLES DRAWN AEROBIC AND ANAEROBIC 6CC EACH  Final   Culture   Final           BLOOD CULTURE RECEIVED NO GROWTH TO DATE CULTURE WILL BE HELD FOR 5 DAYS BEFORE ISSUING A FINAL NEGATIVE REPORT Performed at [facility]    Report Status PENDING  Incomplete  MRSA PCR Screening     Status: None   Collection Time: 03/02/14  1:53 AM  Result Value Ref Range Status   MRSA by PCR NEGATIVE NEGATIVE Final    Comment:        The GeneXpert MRSA Assay (FDA approved for NASAL specimens only), is one component of  a comprehensive MRSA colonization surveillance program. It is not intended to diagnose MRSA infection nor to guide or monitor treatment for MRSA infections.   Clostridium Difficile by PCR     Status: None   Collection Time: 03/02/14  2:47 PM  Result Value Ref Range Status   C difficile by pcr NEGATIVE NEGATIVE Final    Anti-infectives    Start     Dose/Rate Route Frequency Ordered Stop   03/02/14 1900  fluconazole (DIFLUCAN) 40 MG/ML suspension 100 mg     100 mg Oral Daily 03/02/14 1803     03/02/14 0800  vancomycin (VANCOCIN) IVPB 750 mg/150 ml premix     750 mg150 mL/hr over 60 Minutes Intravenous Every 8 hours 03/02/14 0313     03/02/14 0400  ceFEPIme (MAXIPIME) 2 g in dextrose 5 % 50 mL IVPB     2 g100 mL/hr over 30 Minutes Intravenous Every 8 hours 03/02/14 0313     03/01/14 2330  vancomycin (VANCOCIN) IVPB 1000 mg/200 mL premix     1,000 mg200 mL/hr over 60 Minutes Intravenous  Once 03/01/14 2318 03/02/14 0030   03/01/14 2330  ceFEPIme (MAXIPIME) 2 g in dextrose 5 % 50 mL IVPB  Status:  Discontinued     2 g100 mL/hr over 30 Minutes Intravenous  Once 03/01/14 2323 03/02/14 0245      Assessment: 24yo male on Vancomycin and Cefepime for R/O sepsis.  Pt is AFebrile, pancytopenic, and blood cultures are ntd.  Vanc Trough was 12.7, which is low but was drawn 1hr late and should be ~15 when considering this.     Goal of Therapy:  Vancomycin trough level 15-20 mcg/ml  Plan:  Continue Vancomcyin 750mg  IV q8 Watch renal fxn and f/u culture results.  Gracy Bruins, PharmD Clinical Pharmacist [facility]

## 2014-03-04 ENCOUNTER — Telehealth: Payer: Self-pay | Admitting: Family

## 2014-03-04 LAB — COMPREHENSIVE METABOLIC PANEL
ALT: 28 U/L (ref 0–53)
ANION GAP: 6 (ref 5–15)
AST: 18 U/L (ref 0–37)
Albumin: 3.2 g/dL — ABNORMAL LOW (ref 3.5–5.2)
Alkaline Phosphatase: 128 U/L — ABNORMAL HIGH (ref 39–117)
BILIRUBIN TOTAL: 1.2 mg/dL (ref 0.3–1.2)
BUN: 14 mg/dL (ref 6–23)
CO2: 32 mmol/L (ref 19–32)
CREATININE: 0.5 mg/dL (ref 0.50–1.35)
Calcium: 8.9 mg/dL (ref 8.4–10.5)
Chloride: 108 mmol/L (ref 96–112)
GFR calc Af Amer: >90 mL/min (ref >90)
Glucose, Bld: 87 mg/dL (ref 70–99)
POTASSIUM: 3.3 mmol/L — AB (ref 3.5–5.1)
SODIUM: 146 mmol/L — AB (ref 135–145)
Total Protein: 6.2 g/dL (ref 6.0–8.3)

## 2014-03-04 LAB — CBC WITH DIFFERENTIAL/PLATELET
BASOS ABS: 0 10*3/uL (ref 0.0–0.1)
BASOS PCT: 1 % (ref 0–1)
EOS ABS: 0.1 10*3/uL (ref 0.0–0.7)
Eosinophils Relative: 5 % (ref 0–5)
HCT: 29.5 % — ABNORMAL LOW (ref 39.0–52.0)
HEMOGLOBIN: 9.5 g/dL — AB (ref 13.0–17.0)
LYMPHS ABS: 0.7 10*3/uL (ref 0.7–4.0)
Lymphocytes Relative: 37 % (ref 12–46)
MCH: 30.4 pg (ref 26.0–34.0)
MCHC: 32.2 g/dL (ref 30.0–36.0)
MCV: 94.6 fL (ref 78.0–100.0)
MONO ABS: 0.4 10*3/uL (ref 0.1–1.0)
MONOS PCT: 25 % — AB (ref 3–12)
NEUTROS ABS: 0.5 10*3/uL — AB (ref 1.7–7.7)
Neutrophils Relative %: 32 % — ABNORMAL LOW (ref 43–77)
PLATELETS: 116 10*3/uL — AB (ref 150–400)
RBC: 3.12 MIL/uL — AB (ref 4.22–5.81)
RDW: 15.9 % — ABNORMAL HIGH (ref 11.5–15.5)
WBC: 1.7 10*3/uL — ABNORMAL LOW (ref 4.0–10.5)

## 2014-03-04 LAB — TYPE AND SCREEN
ABO/RH(D): O POS
ANTIBODY SCREEN: NEGATIVE
UNIT DIVISION: 0
Unit division: 0
Unit division: 0

## 2014-03-04 LAB — PATHOLOGIST SMEAR REVIEW

## 2014-03-04 MED ORDER — CHLORHEXIDINE GLUCONATE 0.12 % MT SOLN
15.0000 mL | Freq: Two times a day (BID) | OROMUCOSAL | Status: DC
  Administered 2014-03-04: 15 mL via OROMUCOSAL

## 2014-03-04 MED ORDER — HEPARIN SOD (PORK) LOCK FLUSH 100 UNIT/ML IV SOLN: 500.0000 [IU] | INTRAVENOUS | Status: DC | PRN

## 2014-03-04 MED ORDER — POTASSIUM CHLORIDE 20 MEQ/15ML (10%) PO SOLN: 20.0000 meq | Freq: Every day | ORAL | Status: DC

## 2014-03-04 MED ORDER — FLUCONAZOLE 40 MG/ML PO SUSR: 100.0000 mg | Freq: Every day | ORAL | Status: DC

## 2014-03-04 MED ORDER — MICONAZOLE NITRATE 2 % EX CREA: TOPICAL_CREAM | Freq: Two times a day (BID) | CUTANEOUS | Status: DC

## 2014-03-04 MED ORDER — CETYLPYRIDINIUM CHLORIDE 0.05 % MT LIQD: 7.0000 mL | Freq: Two times a day (BID) | OROMUCOSAL | Status: DC

## 2014-03-04 NOTE — Discharge Summary (Signed)
Physician Discharge Summary  Jeffery [name] ZOX:[id number] DOB: 1990/11/06 DOA: 03/01/2014  PCP: Marijean Bravo, MD  Admit date: 03/01/2014 Discharge date: 03/04/2014  Time spent: 45 minutes  Recommendations for Outpatient Follow-up:  1. Follow up with Dr. Jobe Igo in 1 week 2. Follow up with Dr. Gaynell Face as scheduled 3. Follow up with Dr. Whitney Muse after cytometry results  Recommendations for primary care physician for things to follow:  Repeat CBC in 4-5 days Follow up on remaining pending labs  Discharge Diagnoses:  Principal Problem:   Pancytopenia Active Problems:   Congenital spastic quadriplegia   Dysphagia   Neuromuscular scoliosis   Complex partial seizures   OSA on CPAP   Aspiration pneumonia  Discharge Condition: stable  Diet recommendation: tube feeds  Filed Weights   03/02/14 0145  Weight: 60.8 kg (134 lb 0.6 oz)   History of present illness:  Jeffery [name] is a 24 y.o. male with Past medical history of cerebral palsy with spastic quadriplegia, epilepsy, intraventricular hemorrhage, recurrent aspiration pneumonia, dysphagia with PEGtube placement, chronic diarrhea. The patient is presenting with complaints of increased sleepiness. Patient had an extensive neurological history due to his cerebral palsy and has recurrent aspiration pneumonia. Patient has been more sleepy drowsy and lethargic over last 1 week. There was no fever reported. No chills no skin rash. No changes in his medication. No fall no trauma no injury reported. Patient is not taking any over-the-counter medication. His gabapentin was changed in November. His baclofen has been refilled in November. Reportedly initially patient's mother thought that he has been having subclinical seizures. There is no worsening diarrhea reported. There is no pain reported. There is no travel reported.  Hospital Course:  Pancytopenia - workup underway, DIC panel is unremarkable, no schistocytes or blasts on smear.  His WBC have started to slowly improve prior to discharge, his anemia has improved with 3U pRBC and his platelets are slowly starting to recover. He has a small degree of chronic underlying thrombocytopenia.  - mild macrocytosis, B12 normal at 704, MMA pending - DAT negative - HIV, parvovirus pending - haptoglobin normal 193 - LDH normal at 95 - Dr. Whitney Muse from hematology has followed patient while hospitalized - afebrile, without respiratory symptoms, CXR unremarkable, low suspicion for infection. Cultures obtained and have remained negative, will discontinue antibiotics.  - his mother declines a bone marrow biopsy as per hematology notes, have obtained flow cytometry prior to discharge, this is as well pending at the time of discharge CP with spastic quadriplegia and seizure disorder - questionable sublinical seizures, possible in the setting of profound anemia, Dr. Leonel Ramsay from Neurology followed patient while hospitalized.His AEDs may be the cause of his pancytopenia, if oncology workup is negative will defer to outpatient neurology further adjustments to his medications.  History of recurrent aspiration pneumonias - no clinical indication that he is having pneumonia right now, but was covered with broad spectrum antibiotics which were discontinued once cultures remained negative. His lungs have mild rhonchi, suspect a degree of coarse breath sounds at baseline. CXR clear and per family his breathing is at baseline. OSA - on BiPAP at home, has not been using in the past month as per his mother he has been "fighting it" Diarrhea - acute on chronic, extensively evaluated in the past, C diff negative, Lomotil prn Inguinal rash - started diflucan 1/30, continue for 9 additional days   Procedures:  None    Consultations:  Neurology  Hematology   Discharge Exam: [facility]  Vitals:   03/03/14 1607 03/03/14 2114 03/04/14 0535 03/04/14 0808  BP:  116/67 127/80   Pulse:  108 108   Temp:   98.5 F (36.9 C) 98.6 F (37 C)   TempSrc:  Axillary Axillary   Resp:  18 20   Height:      Weight:      SpO2: 99% 95% 100% 97%   General: NAD Cardiovascular: RRR Respiratory: no wheezing  Discharge Instructions    Medication List    TAKE these medications        albuterol (2.5 MG/3ML) 0.083% nebulizer solution  Commonly known as:  PROVENTIL  Take 2.5 mg by nebulization. 1 ampule twice daily and as needed (making 4 doses daily)     albuterol 108 (90 BASE) MCG/ACT inhaler  Commonly known as:  PROVENTIL HFA;VENTOLIN HFA  Inhale 2 puffs into the lungs every 4 (four) hours as needed for shortness of breath.     baclofen 91638 MCG/20ML Soln  Commonly known as:  GABLOFEN  385.2 mcg by Intrathecal route continuous.     BALMEX 11.3 % Crea cream  Generic drug:  zinc oxide  Apply 1 application topically 5 (five) times daily. With each diaper change.     BOTOX COSMETIC 50 UNITS Solr  Generic drug:  Botulinum Toxin Type A (Cosm)  Inject 300 Units into the muscle.     calcium carbonate (dosed in mg elemental calcium) 1250 (500 CA) MG/5ML  1,250 mg of elemental calcium by PEG Tube route 3 (three) times daily. 5 cc via peg tube     diazepam 10 MG Gel  Commonly known as:  DIASTAT ACUDIAL  Place 12.5 mg rectally as needed for seizure.     dicyclomine 10 MG/5ML syrup  Commonly known as:  BENTYL  Place into feeding tube every 8 (eight) hours as needed. Not to exceed 5 doses in 1 wk     diphenoxylate-atropine 2.5-0.025 MG/5ML liquid  Commonly known as:  LOMOTIL  Place into feeding tube 4 (four) times daily as needed for diarrhea or loose stools. 1-2 tsp via feeding tube q6h prn loose stools     docusate 50 MG/5ML liquid  Commonly known as:  COLACE  Take 50 mg by mouth daily as needed for mild constipation.     fluconazole 40 MG/ML suspension  Commonly known as:  DIFLUCAN  Take 2.5 mLs (100 mg total) by mouth daily. For 9 additional days     folic acid 1 MG tablet  Commonly  known as:  FOLVITE  Take 1 mg by mouth daily.     free water Soln  Place 172 mLs into feeding tube 4 (four) times daily.     furosemide 10 MG/ML solution  Commonly known as:  LASIX  Place 20 mg into feeding tube daily as needed for fluid.     gabapentin 250 MG/5ML solution  Commonly known as:  NEURONTIN  Take 18 mL 3 times daily     KEPPRA 100 MG/ML solution  Generic drug:  levETIRAcetam  TAKE 17.5 ML'S BY MOUTH TWICE A DAY     lansoprazole 30 MG disintegrating tablet  Commonly known as:  PREVACID SOLUTAB  Take 30 mg by mouth 2 (two) times daily. 730am     miconazole 2 % cream  Commonly known as:  MICOTIN  Apply topically 2 (two) times daily.     midazolam 5 MG/ML injection  Commonly known as:  VERSED  Place 2 mLs (10 mg total) into the nose  once. Draw up 70m in 2 syringes. Remove blue vial access device. Attach syringe to nasal atomizer for intranasal administration. Give 131min right nostril x 2 for seizures lasting 2 minutes or longer or for repetitive seizures in a short period of time.     multivitamin Liqd  10 mLs by PEG Tube route daily.     mupirocin ointment 2 %  Commonly known as:  BACTROBAN  Apply 1 application topically as needed (to G-T site).     nystatin cream  Commonly known as:  MYCOSTATIN  Apply 1,5,974,163pplication topically. 3-4 times daily     OVER THE COUNTER MEDICATION  Apply 1 application topically as needed (after diaper changes). Balmex and Bag Balm paste made and applied after diaper changes     OXYGEN  Inhale into the lungs. Oxygen PRN to keep O2 Sat at 90%     polyethylene glycol packet  Commonly known as:  MIRALAX / GLYCOLAX  Take 17 g by mouth daily.     simethicone 125 MG chewable tablet  Commonly known as:  MYLICON  Chew 12845g by mouth every 6 (six) hours as needed for flatulence.     sodium phosphate enema  Commonly known as:  FLEET  Place 1 enema rectally. follow package directions PRN     sucralfate 1 GM/10ML suspension    Commonly known as:  CARAFATE  Place 1 g into feeding tube 4 (four) times daily. 7am, 130pm, 5pm, 8pm     SUDAFED CHILDRENS 15 MG/5ML Liqd  Generic drug:  Pseudoephedrine HCl  Give 90 mg by tube every 6 (six) hours.     TWOCAL HN Liqd  Take 237 mLs by mouth. T.3 can at 46 cc hour x 12 hours - water 172 cc pre and post each and extra 172 bid.     PROMOD PO  Take 30 mLs by mouth 2 (two) times daily. Noon and 10pm     VIMPAT 10 MG/ML Soln  Generic drug:  Lacosamide  Give 59m89my mouth twice per day     Vitamin C 500 MG/5ML Liqd  300 mg by PEG Tube route at bedtime.     ZINC PO  5 mLs by PEG Tube route daily. 244 mg / 5 mL zinc solution           Follow-up Information    Follow up with [name], DAVID C, MD. Schedule an appointment as soon as possible for a visit in 1 week.   Specialty:  Internal Medicine   Contact information:   [address] [place] [phone]    Follow up with HICJodi GeraldsD. Schedule an appointment as soon as possible for a visit in 4 weeks.   Specialty:  Pediatrics   Contact information:   [phone]    Follow up with PenMolli HazardD. Schedule an appointment as soon as possible for a visit in 1 week.   Specialties:  Hematology and Oncology, Oncology   Contact information:   [address] [place] [phone]    The results of significant diagnostics from this hospitalization (including imaging, microbiology, ancillary and laboratory) are listed below for reference.    Significant Diagnostic Studies: Dg Chest Portable 1 View  03/01/2014   CLINICAL DATA:  Anemia. Chronic right lower lobe pneumonia. Neuro muscular disease.  EXAM: PORTABLE CHEST - 1 VIEW  COMPARISON:  11/06/2013  FINDINGS: Right jugular port appears unchanged in position. Spinal fixation hardware again noted.  There are low lung volumes. No airspace  consolidation is evident. No large effusions are evident.  IMPRESSION: No acute findings   Electronically Signed   By: Andreas Newport M.D.   On: 03/01/2014 20:16    Microbiology: Recent Results (from the past 240 hour(s))  Blood culture (routine x 2)     Status: None (Preliminary result)   Collection Time: 03/01/14  8:20 PM  Result Value Ref Range Status   Specimen Description BLOOD RIGHT PORTA CART  Final   Special Requests BOTTLES DRAWN AEROBIC AND ANAEROBIC 6CC EACH  Final   Culture   Final           BLOOD CULTURE RECEIVED NO GROWTH TO DATE CULTURE WILL BE HELD FOR 5 DAYS BEFORE ISSUING A FINAL NEGATIVE REPORT Performed at [facility]    Report Status PENDING  Incomplete  MRSA PCR Screening     Status: None   Collection Time: 03/02/14  1:53 AM  Result Value Ref Range Status   MRSA by PCR NEGATIVE NEGATIVE Final    Comment:        The GeneXpert MRSA Assay (FDA approved for NASAL specimens only), is one component of a comprehensive MRSA colonization surveillance program. It is not intended to diagnose MRSA infection nor to guide or monitor treatment for MRSA infections.   Clostridium Difficile by PCR     Status: None   Collection Time: 03/02/14  2:47 PM  Result Value Ref Range Status   C difficile by pcr NEGATIVE NEGATIVE Final     Labs: Basic Metabolic Panel:  Recent Labs Lab 03/01/14 2020 03/02/14 0400 03/03/14 0430 03/04/14 0414  NA 138 142 144 146*  K 3.7 3.8 3.5 3.3*  CL 101 105 107 108  CO2 31 32 32 32  GLUCOSE 122* 94 96 87  BUN 21 15 12 14   CREATININE 0.48* 0.45* 0.52 0.50  CALCIUM 9.0 9.0 8.8 8.9  MG  --   --  2.0  --   PHOS  --   --  4.3  --    Liver Function Tests:  Recent Labs Lab 03/01/14 2020 03/02/14 0400 03/03/14 0430 03/04/14 0414  AST 26 20 19 18   ALT 39 34 31 28  ALKPHOS 141* 123* 125* 128*  BILITOT 0.8 0.8 1.4* 1.2  PROT 7.2 6.1 6.3 6.2  ALBUMIN 3.9 3.2* 3.3* 3.2*   CBC:  Recent Labs Lab 03/01/14 2020  03/01/14 2247 03/02/14 0523 03/02/14 1400 03/03/14 0430 03/04/14 0414  WBC 0.9*  --  1.0* 0.8* 1.2* 1.7*  NEUTROABS 0.1*  --  0.3*  --  0.3* 0.5*  HGB 4.7*  --  4.2* 7.6* 7.6* 9.5*  HCT 16.2*  --  13.5* 22.7* 23.7* 29.5*  MCV 103.8*  --  100.7* 91.2 93.7 94.6  PLT 101* 86* 90* 90* 110* 116*    Signed:  Keairra Bardon  [facility] 03/04/2014, 12:27 PM

## 2014-03-04 NOTE — Telephone Encounter (Signed)
I recommended discontinuing Vimpat because has been recently reduced but he's been on Keppra for a long time.  I recommended that she discuss bone marrow biopsy with the hematologist oncologist.  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  He responded very nicely to the transfusion.  Whether or not his blood counsel stay up or drop is unclear.  I will plan to see him in late February but we can work him in sooner if it becomes necessary to do so.  This is more of a medical issue, not a neurologic one unless seizures become problematic as we stop Vimpat.

## 2014-03-04 NOTE — Progress Notes (Signed)
Pt is ready for DC home with family. Family states they understand all DC instructions, follow up appointments, and medications.   Prescilla Sours, Therapist, sports

## 2014-03-04 NOTE — Discharge Instructions (Signed)
Follow with [name], DAVID C, MD in 5-7 days  Please get a complete blood count and chemistry panel checked by your Primary MD at your next visit, and again as instructed by your Primary MD. Please get your medications reviewed and adjusted by your Primary MD.  Please request your Primary MD to go over all Hospital Tests and Procedure/Radiological results at the follow up, please get all Hospital records sent to your Prim MD by signing hospital release before you go home.  If you had Pneumonia of Lung problems at the Hospital: Please get a 2 view Chest X ray done in 6-8 weeks after hospital discharge or sooner if instructed by your Primary MD.  If you have Congestive Heart Failure: Please call your Cardiologist or Primary MD anytime you have any of the following symptoms:  1) 3 pound weight gain in 24 hours or 5 pounds in 1 week  2) shortness of breath, with or without a dry hacking cough  3) swelling in the hands, feet or stomach  4) if you have to sleep on extra pillows at night in order to breathe  Follow cardiac low salt diet and 1.5 lit/day fluid restriction.  If you have diabetes Accuchecks 4 times/day, Once in AM empty stomach and then before each meal. Log in all results and show them to your primary doctor at your next visit. If any glucose reading is under 80 or above 300 call your primary MD immediately.  If you have Seizure/Convulsions/Epilepsy: Please do not drive, operate heavy machinery, participate in activities at heights or participate in high speed sports until you have seen by Primary MD or a Neurologist and advised to do so again.  If you had Gastrointestinal Bleeding: Please ask your Primary MD to check a complete blood count within one week of discharge or at your next visit. Your endoscopic/colonoscopic biopsies that are pending at the time of discharge, will also need to followed by your Primary MD.  Get Medicines reviewed and adjusted. Please take all your  medications with you for your next visit with your Primary MD  Please request your Primary MD to go over all hospital tests and procedure/radiological results at the follow up, please ask your Primary MD to get all Hospital records sent to his/her office.  If you experience worsening of your admission symptoms, develop shortness of breath, life threatening emergency, suicidal or homicidal thoughts you must seek medical attention immediately by calling 911 or calling your MD immediately  if symptoms less severe.  You must read complete instructions/literature along with all the possible adverse reactions/side effects for all the Medicines you take and that have been prescribed to you. Take any new Medicines after you have completely understood and accpet all the possible adverse reactions/side effects.   Do not drive or operate heavy machinery when taking Pain medications.   Do not take more than prescribed Pain, Sleep and Anxiety Medications  Special Instructions: If you have smoked or chewed Tobacco  in the last 2 yrs please stop smoking, stop any regular Alcohol  and or any Recreational drug use.  Wear Seat belts while driving.  Please note You were cared for by a hospitalist during your hospital stay. If you have any questions about your discharge medications or the care you received while you were in the hospital after you are discharged, you can call the unit and asked to speak with the hospitalist on call if the hospitalist that took care of you is not available.  Once you are discharged, your primary care physician will handle any further medical issues. Please note that NO REFILLS for any discharge medications will be authorized once you are discharged, as it is imperative that you return to your primary care physician (or establish a relationship with a primary care physician if you do not have one) for your aftercare needs so that they can reassess your need for medications and monitor your  lab values.  You can reach the hospitalist office at phone [phone] or fax [phone]   If you do not have a primary care physician, you can call [phone] for a physician referral.  Activity: As tolerated with Full fall precautions use walker/cane & assistance as needed  Diet: tube feeds  Disposition Home with parents

## 2014-03-04 NOTE — Telephone Encounter (Signed)
[name] left message saying that she wants Dr Gaynell Face to call her regarding taking Jeffery [name] off Vimpat and Keppra. She said that he has been in the hospital and providers there feel that the meds are the cause of his current problems. She said that neurologist there disagreed because he has been on meds for long time but she agrees with others and wants him off. She said that Jeffery [name] is being discharged today. Mom wants call back at [phone]. TG

## 2014-03-04 NOTE — Progress Notes (Signed)
Porta cath deaccessed. Flushed with 10cc NS followed by Heparin 5ml (100u/ml). No bleeding to site, band aid to site for comfort. Jeffery [name] 

## 2014-03-04 NOTE — Telephone Encounter (Signed)
[name] left another message saying that Jeffery [name] is home from the hospital now and that she is anxious to talk to Dr Gaynell Face. She was told to make appt with Dr Gaynell Face in 4 weeks. Jeffery [name] has appt on 03/28/14 for refill. Mom wonders if that is really soon enough, because of she wants to stop the Vimpat and Keppra because of the low white count, and if she waits until the appointment on 03/28/14, he will be taking the drug and getting blood draws to check his counts until then. Mom can be reached at [phone]. TG

## 2014-03-04 NOTE — Care Management Note (Signed)
    Page 1 of 1   03/04/2014     10:24:22 AM CARE MANAGEMENT NOTE 03/04/2014  Patient:  Jeffery [name], Jeffery [name]   Account Number:  [id number]  Date Initiated:  03/04/2014  Documentation initiated by:  GRAVES-BIGELOW,Keyarra Rendall  Subjective/Objective Assessment:   Pt admitted for increasing lethargy. Hx of cerebral palsy with spastic quadriplegia & epilepsy. Pt is active with [facility] for RN services.     Action/Plan:   Plan for d/c today. CM did make [name] aware pt here in hospital. Pt will need Resumption Orders for RN services. No further needs from CM at this time.   Anticipated DC Date:  03/04/2014   Anticipated DC Plan:  [place]  CM consult      [facility] Choice  Resumption Of Svcs/PTA Provider   Choice offered to / List presented to:          [facility] arranged  HH-1 RN      [place].   Status of service:  Completed, signed off Medicare Important Message given?  NO (If response is "NO", the following Medicare IM given date fields will be blank) Date Medicare IM given:   Medicare IM given by:   Date Additional Medicare IM given:   Additional Medicare IM given by:    Discharge Disposition:  [place]  Per UR Regulation:  Reviewed for med. necessity/level of care/duration of stay  If discussed at [place] of Stay Meetings, dates discussed:    Comments:

## 2014-03-05 ENCOUNTER — Telehealth (HOSPITAL_COMMUNITY): Payer: Self-pay | Admitting: Hematology & Oncology

## 2014-03-05 LAB — PATHOLOGIST SMEAR REVIEW

## 2014-03-05 LAB — HIV ANTIBODY (ROUTINE TESTING W REFLEX): HIV SCREEN 4TH GENERATION: NONREACTIVE

## 2014-03-05 LAB — METHYLMALONIC ACID, SERUM: Methylmalonic Acid, Quantitative: 182 nmol/L (ref 87–318)

## 2014-03-05 NOTE — Telephone Encounter (Signed)
Noted, thanks, I agree

## 2014-03-05 NOTE — Telephone Encounter (Signed)
[name] left a message to let Dr Gaynell Face know that she called oncology office and left a message that she will allow him to be scheduled for bone marrow biopsy as long as she can go back with him. TG

## 2014-03-05 NOTE — Telephone Encounter (Addendum)
[name] called @ [phone] - she said that talked to [place] pharmacist at [facility] and he said that in pharmacy school they were told that [place] could cause bone marrow disease at any point in therapy. She said that was just [facility] for you and that you do not need to call back. [name] said that does not mean to be disrespectful to you, but that she thought you might want to know that.   [name] called again at 1147AM. She said that oncologist Dr Ancil Linsey called her and told her that CBC done at discharge had improved so will check CBC next Monday as previously planned and then decide what to do about bone marrow bx. Mom said that Dr Whitney Muse was taught in med school that [place] can cause bone marrow suppression even after long term use, so Mom thought you would want to know that. Mom said that this was just an informational message for you, and that there is no need to call her back. TG

## 2014-03-05 NOTE — Telephone Encounter (Signed)
Mother called clinic here for f/u for patient.  I called her back and discussed discharge labs which showed improvement in Jeffery [name]'s wbc count, slow climb of platelets back to prior baseline.  She states his Vimpat has been discontinued. He has a follow-up CBC with the patient's neurologist next Monday.  We decided to wait for the CBC next Monday. She will have a copy of the lab faxed to me and we will make additional decisions at that time.  Ie. Do they want a BMBX. Will also discuss further follow-up.  S. Rebecka Oelkers MD

## 2014-03-06 NOTE — Telephone Encounter (Signed)
[name] called and left message about Jeffery [name]. The call sounded muffled and was difficult to understand, but she said that [facility] had 2 seizures 3 hours apart, after not having 3 doses of Vimpat. She did not give Diastat, and I believe that she said that he responded on his own, but again, it was difficult to hear and understand. Mom wants Dr Gaynell Face to call her at [phone]. TG

## 2014-03-06 NOTE — Telephone Encounter (Addendum)
Episodes were brief, lasting less than a minute there was some cyanosis.  He recovered on his own.  No changes were made in current treatment.  Apparently hematology does not want to perform a bone marrow.  We have to go with their recommendations.He is scheduled for another CBC tomorrow.

## 2014-03-07 LAB — HUMAN PARVOVIRUS DNA DETECTION BY PCR: PARVOVIRUS B19 PCR: NOT DETECTED

## 2014-03-07 LAB — CULTURE, BLOOD (ROUTINE X 2): Culture: NO GROWTH

## 2014-03-08 ENCOUNTER — Telehealth: Payer: Self-pay | Admitting: Neurology

## 2014-03-08 NOTE — Telephone Encounter (Signed)
FYI his white count is only 1.0 and his hemoglobin went from 12 to 9 and can not come out yet. She will call back when he is stable and able to come out. He has to have blood transfusions until these numbers come up. They believe that this was caused from his Vimpat they think is was too much on him. But she will call back when he is stable. She states this may be a month or two.

## 2014-03-11 ENCOUNTER — Encounter (HOSPITAL_COMMUNITY): Payer: Self-pay | Admitting: *Deleted

## 2014-03-11 ENCOUNTER — Other Ambulatory Visit (HOSPITAL_COMMUNITY): Payer: Self-pay | Admitting: *Deleted

## 2014-03-11 ENCOUNTER — Telehealth (HOSPITAL_COMMUNITY): Payer: Self-pay | Admitting: *Deleted

## 2014-03-11 DIAGNOSIS — D61818 Other pancytopenia: Secondary | ICD-10-CM

## 2014-03-11 NOTE — Telephone Encounter (Signed)
Spoke with patients mother this am. She asked for clarification on what to expect for patient. Spoke with Dr.Penland. She has ordered a CBC this week via [place], spoke with Ernestina Columbia [phone]) and she will make visit this week for lab. She will FAX results to [place]. Informed Lattie Haw that Dr.Penland will plan to refer patient to [facility] after this week lab test. Patient will receive outpatient care at [facility].

## 2014-03-11 NOTE — Telephone Encounter (Signed)
-----   Message from Epifanio Lesches sent at 03/11/2014  9:31 AM EST ----- Please call pt's mom [name] @ [phone]. Please call after 10:30 this morning.

## 2014-03-13 ENCOUNTER — Ambulatory Visit: Payer: Medicaid Other | Admitting: Neurology

## 2014-03-19 ENCOUNTER — Telehealth: Payer: Self-pay | Admitting: Family

## 2014-03-19 ENCOUNTER — Telehealth (HOSPITAL_COMMUNITY): Payer: Self-pay

## 2014-03-19 NOTE — Telephone Encounter (Signed)
Ms. Jeffery [name] opts not to go to [facility] or [facility].  Prefers to come to [facility] to see Dr. Whitney Muse.   Will have Home Health get his labs on Thursday.  Appointment made for patient to be seen by Dr. Whitney Muse on 04/05/14.

## 2014-03-19 NOTE — Telephone Encounter (Signed)
I reviewed your note and I agree with your advice to mother.,  Thank you

## 2014-03-19 NOTE — Telephone Encounter (Signed)
[name] left message about Jeffery [name]. Mom wants different pump refill time because of his low white count. She is concerned about him coming in contact with other patients at his upcoming appointment on Feb 25 @ 2:45PM. Mom can be reached at [phone]. I called Mom and told her that on that day, Jeffery [name] will be Dr Melanee Left last patient of the day, and at that time, there will likely be few patients in the lobby. We will also try to get Jeffery [name] back to exam room as soon as he arrives. Mom agreed with that plan. TG

## 2014-03-21 ENCOUNTER — Telehealth (HOSPITAL_COMMUNITY): Payer: Self-pay

## 2014-03-21 NOTE — Telephone Encounter (Signed)
Call back from Mrs. Wyatt.  She is willing to do whatever needs to be done for [facility].  She will have labs drawn Monday AM and faxed to Dr. Whitney Muse. If transfusion is needed she will get him herer.  Is willing to have bone marrow biopsy and aspiration performed only if she can remain with him while it is being done otherwise no.

## 2014-03-21 NOTE — Telephone Encounter (Signed)
Per Dr. Whitney Muse, message left for Mrs. Wyatt regarding [name]'s decreased WBC and Hemoglobin and that a CBC/diff needs to be done on Monday morning and the potential need for transfusion.  Also requested that she think about [name] having a Bone Marrow Biopsy and Aspiration.  Call back requested.

## 2014-03-25 ENCOUNTER — Telehealth: Payer: Self-pay

## 2014-03-25 NOTE — Telephone Encounter (Signed)
Jeffery [name], mom, lvm stating that she was calling to give an update on the pt. He had labs drawn last Thursday 03/21/14, Hematologist concerned bc of low WBC, and low Hemoglobin, Blood transfusion is scheduled for Friday, mother agreed to pt having a bonemarrow biopsy, pt is going to be going to cancer hospital in [place]. Jeffery [name] sad that she can be reached at: [phone].

## 2014-03-25 NOTE — Telephone Encounter (Signed)
I spoke with Dr. Jobe Igo on Friday.  He inform me of the plans to perform a bone marrow biopsy.  I completely agree with this plan.  If there is evidence of suppression a bone marrow, levetiracetam will have to be stopped, and we will use diazepam gel to try to control his seizures.  Most of the other medications that I would introduce would also suppress bone marrow and that would be problematic.  I spoke with mother today for about 7 minutes and reassured her that this is a good plan.

## 2014-03-27 ENCOUNTER — Encounter (HOSPITAL_COMMUNITY): Payer: Medicaid Other | Attending: Hematology & Oncology

## 2014-03-27 ENCOUNTER — Encounter (HOSPITAL_COMMUNITY): Payer: Self-pay

## 2014-03-27 VITALS — HR 133

## 2014-03-27 DIAGNOSIS — Z452 Encounter for adjustment and management of vascular access device: Secondary | ICD-10-CM

## 2014-03-27 DIAGNOSIS — D61818 Other pancytopenia: Secondary | ICD-10-CM | POA: Insufficient documentation

## 2014-03-27 DIAGNOSIS — D72819 Decreased white blood cell count, unspecified: Secondary | ICD-10-CM

## 2014-03-27 LAB — CBC WITH DIFFERENTIAL/PLATELET
Basophils Absolute: 0 10*3/uL (ref 0.0–0.1)
Basophils Relative: 0 % (ref 0–1)
Eosinophils Absolute: 0 10*3/uL (ref 0.0–0.7)
Eosinophils Relative: 3 % (ref 0–5)
HEMATOCRIT: 26.5 % — AB (ref 39.0–52.0)
Hemoglobin: 8.4 g/dL — ABNORMAL LOW (ref 13.0–17.0)
LYMPHS PCT: 25 % (ref 12–46)
Lymphs Abs: 0.4 10*3/uL — ABNORMAL LOW (ref 0.7–4.0)
MCH: 30.8 pg (ref 26.0–34.0)
MCHC: 31.7 g/dL (ref 30.0–36.0)
MCV: 97.1 fL (ref 78.0–100.0)
MONO ABS: 0.3 10*3/uL (ref 0.1–1.0)
MONOS PCT: 20 % — AB (ref 3–12)
NEUTROS ABS: 0.9 10*3/uL — AB (ref 1.7–7.7)
NEUTROS PCT: 52 % (ref 43–77)
PLATELETS: 113 10*3/uL — AB (ref 150–400)
RBC: 2.73 MIL/uL — ABNORMAL LOW (ref 4.22–5.81)
RDW: 16 % — AB (ref 11.5–15.5)
WBC: 1.6 10*3/uL — AB (ref 4.0–10.5)

## 2014-03-27 LAB — PREPARE RBC (CROSSMATCH)

## 2014-03-27 LAB — ABO/RH: ABO/RH(D): O POS

## 2014-03-27 MED ORDER — HEPARIN SOD (PORK) LOCK FLUSH 100 UNIT/ML IV SOLN
500.0000 [IU] | Freq: Once | INTRAVENOUS | Status: AC
  Administered 2014-03-27: 500 [IU] via INTRAVENOUS

## 2014-03-27 MED ORDER — SODIUM CHLORIDE 0.9 % IJ SOLN
10.0000 mL | INTRAMUSCULAR | Status: DC | PRN
  Administered 2014-03-27: 10 mL via INTRAVENOUS
  Filled 2014-03-27: qty 10

## 2014-03-27 MED ORDER — HEPARIN SOD (PORK) LOCK FLUSH 100 UNIT/ML IV SOLN
INTRAVENOUS | Status: AC
  Filled 2014-03-27: qty 5

## 2014-03-27 NOTE — Progress Notes (Signed)
Jeffery [name] presented for Portacath access and flush. Portacath located rt chest wall accessed with  H 20 needle. Good blood return present. Portacath flushed with 63ml NS and 500U/36ml Heparin and needle removed intact. Procedure without incident. Patient tolerated procedure well.

## 2014-03-28 ENCOUNTER — Ambulatory Visit (INDEPENDENT_AMBULATORY_CARE_PROVIDER_SITE_OTHER): Payer: Medicaid Other | Admitting: Pediatrics

## 2014-03-28 ENCOUNTER — Inpatient Hospital Stay (HOSPITAL_COMMUNITY)
Admission: EM | Admit: 2014-03-28 | Discharge: 2014-04-01 | DRG: 808 | Disposition: A | Discharge status: Home / Self Care | Payer: Medicaid Other | Attending: Internal Medicine | Admitting: Internal Medicine

## 2014-03-28 ENCOUNTER — Emergency Department (HOSPITAL_COMMUNITY): Payer: Medicaid Other

## 2014-03-28 ENCOUNTER — Encounter (HOSPITAL_COMMUNITY): Payer: Self-pay | Admitting: Emergency Medicine

## 2014-03-28 ENCOUNTER — Other Ambulatory Visit (HOSPITAL_COMMUNITY): Payer: Medicaid Other

## 2014-03-28 ENCOUNTER — Encounter: Payer: Self-pay | Admitting: Pediatrics

## 2014-03-28 ENCOUNTER — Other Ambulatory Visit: Payer: Self-pay

## 2014-03-28 DIAGNOSIS — G808 Other cerebral palsy: Secondary | ICD-10-CM

## 2014-03-28 DIAGNOSIS — G40219 Localization-related (focal) (partial) symptomatic epilepsy and epileptic syndromes with complex partial seizures, intractable, without status epilepticus: Secondary | ICD-10-CM

## 2014-03-28 DIAGNOSIS — T426X5A Adverse effect of other antiepileptic and sedative-hypnotic drugs, initial encounter: Secondary | ICD-10-CM | POA: Diagnosis present

## 2014-03-28 DIAGNOSIS — D61818 Other pancytopenia: Secondary | ICD-10-CM | POA: Diagnosis present

## 2014-03-28 DIAGNOSIS — M4140 Neuromuscular scoliosis, site unspecified: Secondary | ICD-10-CM | POA: Diagnosis present

## 2014-03-28 DIAGNOSIS — D702 Other drug-induced agranulocytosis: Principal | ICD-10-CM | POA: Diagnosis present

## 2014-03-28 DIAGNOSIS — G40209 Localization-related (focal) (partial) symptomatic epilepsy and epileptic syndromes with complex partial seizures, not intractable, without status epilepticus: Secondary | ICD-10-CM | POA: Diagnosis present

## 2014-03-28 DIAGNOSIS — M2450 Contracture, unspecified joint: Secondary | ICD-10-CM | POA: Diagnosis present

## 2014-03-28 DIAGNOSIS — G40311 Generalized idiopathic epilepsy and epileptic syndromes, intractable, with status epilepticus: Secondary | ICD-10-CM

## 2014-03-28 DIAGNOSIS — Z7951 Long term (current) use of inhaled steroids: Secondary | ICD-10-CM

## 2014-03-28 DIAGNOSIS — Z8673 Personal history of transient ischemic attack (TIA), and cerebral infarction without residual deficits: Secondary | ICD-10-CM

## 2014-03-28 DIAGNOSIS — E871 Hypo-osmolality and hyponatremia: Secondary | ICD-10-CM | POA: Diagnosis present

## 2014-03-28 DIAGNOSIS — G40319 Generalized idiopathic epilepsy and epileptic syndromes, intractable, without status epilepticus: Secondary | ICD-10-CM

## 2014-03-28 DIAGNOSIS — N179 Acute kidney failure, unspecified: Secondary | ICD-10-CM | POA: Diagnosis present

## 2014-03-28 DIAGNOSIS — R1310 Dysphagia, unspecified: Secondary | ICD-10-CM | POA: Diagnosis present

## 2014-03-28 DIAGNOSIS — Z79899 Other long term (current) drug therapy: Secondary | ICD-10-CM

## 2014-03-28 DIAGNOSIS — G709 Myoneural disorder, unspecified: Secondary | ICD-10-CM | POA: Diagnosis present

## 2014-03-28 DIAGNOSIS — G4730 Sleep apnea, unspecified: Secondary | ICD-10-CM | POA: Diagnosis present

## 2014-03-28 DIAGNOSIS — H35109 Retinopathy of prematurity, unspecified, unspecified eye: Secondary | ICD-10-CM | POA: Diagnosis present

## 2014-03-28 DIAGNOSIS — R6510 Systemic inflammatory response syndrome (SIRS) of non-infectious origin without acute organ dysfunction: Secondary | ICD-10-CM | POA: Diagnosis present

## 2014-03-28 DIAGNOSIS — Z931 Gastrostomy status: Secondary | ICD-10-CM

## 2014-03-28 DIAGNOSIS — G800 Spastic quadriplegic cerebral palsy: Secondary | ICD-10-CM | POA: Diagnosis present

## 2014-03-28 DIAGNOSIS — D709 Neutropenia, unspecified: Secondary | ICD-10-CM

## 2014-03-28 DIAGNOSIS — R5081 Fever presenting with conditions classified elsewhere: Secondary | ICD-10-CM | POA: Diagnosis present

## 2014-03-28 LAB — CBC WITH DIFFERENTIAL/PLATELET
Basophils Absolute: 0 10*3/uL (ref 0.0–0.1)
Basophils Relative: 0 % (ref 0–1)
EOS ABS: 0 10*3/uL (ref 0.0–0.7)
Eosinophils Relative: 0 % (ref 0–5)
HCT: 23.7 % — ABNORMAL LOW (ref 39.0–52.0)
Hemoglobin: 7.7 g/dL — ABNORMAL LOW (ref 13.0–17.0)
Lymphocytes Relative: 34 % (ref 12–46)
Lymphs Abs: 0.2 10*3/uL — ABNORMAL LOW (ref 0.7–4.0)
MCH: 31 pg (ref 26.0–34.0)
MCHC: 32.5 g/dL (ref 30.0–36.0)
MCV: 95.6 fL (ref 78.0–100.0)
Monocytes Absolute: 0.2 10*3/uL (ref 0.1–1.0)
Monocytes Relative: 22 % — ABNORMAL HIGH (ref 3–12)
NEUTROS ABS: 0.3 10*3/uL — AB (ref 1.7–7.7)
Neutrophils Relative %: 44 % (ref 43–77)
Platelets: 102 10*3/uL — ABNORMAL LOW (ref 150–400)
RBC: 2.48 MIL/uL — ABNORMAL LOW (ref 4.22–5.81)
RDW: 16.4 % — ABNORMAL HIGH (ref 11.5–15.5)
WBC: 0.7 10*3/uL — CL (ref 4.0–10.5)

## 2014-03-28 LAB — COMPREHENSIVE METABOLIC PANEL
ALT: 39 U/L (ref 0–53)
AST: 23 U/L (ref 0–37)
Albumin: 3.6 g/dL (ref 3.5–5.2)
Alkaline Phosphatase: 136 U/L — ABNORMAL HIGH (ref 39–117)
Anion gap: 13 (ref 5–15)
BUN: 16 mg/dL (ref 6–23)
CALCIUM: 8.9 mg/dL (ref 8.4–10.5)
CO2: 24 mmol/L (ref 19–32)
CREATININE: 0.62 mg/dL (ref 0.50–1.35)
Chloride: 104 mmol/L (ref 96–112)
GFR calc Af Amer: >90 mL/min (ref >90)
GFR calc non Af Amer: >90 mL/min (ref >90)
Glucose, Bld: 110 mg/dL — ABNORMAL HIGH (ref 70–99)
POTASSIUM: 3.8 mmol/L (ref 3.5–5.1)
SODIUM: 141 mmol/L (ref 135–145)
Total Bilirubin: 0.6 mg/dL (ref 0.3–1.2)
Total Protein: 6.8 g/dL (ref 6.0–8.3)

## 2014-03-28 LAB — I-STAT CG4 LACTIC ACID, ED: Lactic Acid, Venous: 1.71 mmol/L (ref 0.5–2.0)

## 2014-03-28 MED ORDER — SODIUM CHLORIDE 0.9 % IV BOLUS (SEPSIS)
2000.0000 mL | Freq: Once | INTRAVENOUS | Status: AC
  Administered 2014-03-28: 2000 mL via INTRAVENOUS

## 2014-03-28 MED ORDER — ACETAMINOPHEN 650 MG RE SUPP
650.0000 mg | RECTAL | Status: DC | PRN
  Administered 2014-03-28: 650 mg via RECTAL
  Filled 2014-03-28: qty 1

## 2014-03-28 MED ORDER — VANCOMYCIN HCL IN DEXTROSE 1-5 GM/200ML-% IV SOLN
1000.0000 mg | Freq: Once | INTRAVENOUS | Status: AC
  Administered 2014-03-28: 1000 mg via INTRAVENOUS
  Filled 2014-03-28: qty 200

## 2014-03-28 MED ORDER — PIPERACILLIN-TAZOBACTAM 3.375 G IVPB 30 MIN
3.3750 g | Freq: Once | INTRAVENOUS | Status: AC
  Administered 2014-03-28: 3.375 g via INTRAVENOUS
  Filled 2014-03-28: qty 50

## 2014-03-28 NOTE — ED Provider Notes (Signed)
CSN: [id number]     Arrival date & time 03/28/14  2209 History   None    Chief Complaint  Patient presents with  . Code Sepsis     (Consider location/radiation/quality/duration/timing/severity/associated sxs/prior Treatment) HPI Level V caveat code sepsis. Patient is noncommunicative Patient history is obtained from patient's caregiver. Patient developed fever 7 PM tonight. Patient does not offer any specific complaint he's had no cough. No vomiting. He has chronic diarrhea from tube feeding. No treatment prior to coming here. Past Medical History  Diagnosis Date  . CP (cerebral palsy), spastic, quadriplegic   . Epilepsy   . Osteoporosis   . Inguinal hernia   . Undescended testes   . Seasonal allergies   . IVH (intraventricular hemorrhage)     Grade IV  . Hip dislocation, bilateral   . Seizures   . Hx: UTI (urinary tract infection)   . Dysphagia   . Otitis media   . Retinopathy of prematurity   . 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 Neuromuscular scoliosis   . Osteoporosis   . Complex partial seizures   . Generalized convulsive epilepsy without mention of intractable epilepsy   . Epilepsy   . Sinus bradycardia     HR drops to 38-40 while sleeping  . Sleep apnea     BiPAP  . Blister of right heel     fluid filled; origin unknown  . Kidney stones     ?  Marland Kitchen Pneumonia      chronic pneumonia ,respitory failure dx Augest 2014   Past Surgical History  Procedure Laterality Date  . Excision of moles    . Peg placement      With multiple changes  . Gastrostomy tube, change / reposition  12/15/2010       . Button change  12/15/2010    Procedure: BUTTON CHANGE;  Surgeon: Gatha Mayer, MD;  Location: [facility];  Service: Endoscopy;  Laterality: N/A;  . Peg placement  10/07/2011    Procedure: PERCUTANEOUS ENDOSCOPIC GASTROSTOMY (PEG) REPLACEMENT;  Surgeon: Lafayette Dragon, MD;  Location: [facility];  Service: Endoscopy;  Laterality: N/A;  Needs 18 F 2.5 button  ordered-dl  . Inguinal hernia repair Bilateral 1992  . Retinopathy of prematurity surgery  1992  . Achilles tendon lengthening  12/1998  . Soft tissue releases  wrists and fingers  12/1998  . Intrathecal baclofen pump placement  07/25/2000  . Peg placement N/A 06/05/2012    Procedure: PERCUTANEOUS ENDOSCOPIC GASTROSTOMY (PEG) REPLACEMENT;  Surgeon: Lafayette Dragon, MD;  Location: [facility];  Service: Endoscopy;  Laterality: N/A;  button 40fr.2.5cm  . Eye surgery Bilateral     Retinal   . Back surgery      Harrington Rods in back needs to be log rolled  . Tendon repair Bilateral 09/05/2012    Procedure: LENGTHENING OF DIGITAL FLEXOR TENDONS BILTERAL HANDS;  Surgeon: Jolyn Nap, MD;  Location: [facility];  Service: Orthopedics;  Laterality: Bilateral;  . Hand surgery Bilateral Aug. 2014  . Peg placement N/A 09/13/2012    Procedure: PERCUTANEOUS ENDOSCOPIC GASTROSTOMY (PEG) REPLACEMENT;  Surgeon: Lafayette Dragon, MD;  Location: [facility];  Service: Endoscopy;  Laterality: N/A;  . Flexible sigmoidoscopy N/A 10/30/2012    Procedure: FLEXIBLE SIGMOIDOSCOPY;  Surgeon: Cleotis Nipper, MD;  Location: [facility];  Service: Endoscopy;  Laterality: N/A;  . Peg placement N/A 11/15/2012    Procedure: PERCUTANEOUS ENDOSCOPIC GASTROSTOMY (PEG) REPLACEMENT;  Surgeon: Jeryl Columbia, MD;  Location: [facility];  Service: Endoscopy;  Laterality: N/A;   Family History  Problem Relation Age of Onset  . Adopted: Yes   History  Substance Use Topics  . Smoking status: Never Smoker   . Smokeless tobacco: Never Used  . Alcohol Use: No    Review of Systems  Unable to perform ROS Allergic/Immunologic: Positive for immunocompromised state.       History of neutropenia      Allergies  Adhesive; Depakote; and Amoxicillin-pot clavulanate  Home Medications   Prior to Admission medications   Medication Sig Start Date End Date Taking? Authorizing Provider  albuterol (PROVENTIL HFA;VENTOLIN HFA) 108 (90  BASE) MCG/ACT inhaler Inhale 2 puffs into the lungs every 4 (four) hours as needed for shortness of breath.    Historical Provider, MD  albuterol (PROVENTIL) (2.5 MG/3ML) 0.083% nebulizer solution Take 2.5 mg by nebulization. 1 ampule twice daily and as needed (making 4 doses daily) 10/11/12   Elsie Stain, MD  Ascorbic Acid (VITAMIN C) 500 MG/5ML LIQD 300 mg by PEG Tube route at bedtime.     Historical Provider, MD  baclofen (GABLOFEN) 40000 MCG/20ML SOLN 385.2 mcg by Intrathecal route continuous.  05/04/12   Jodi Geralds, MD  Botulinum Toxin Type A, Cosm, (BOTOX COSMETIC) 50 UNITS SOLR Inject 300 Units into the muscle.    Historical Provider, MD  calcium carbonate, dosed in mg elemental calcium, 1250 MG/5ML 1,250 mg of elemental calcium by PEG Tube route 3 (three) times daily. 5 cc via peg tube    Historical Provider, MD  diazepam (DIASTAT ACUDIAL) 10 MG GEL Place 12.5 mg rectally as needed for seizure.     Historical Provider, MD  dicyclomine (BENTYL) 10 MG/5ML syrup Place into feeding tube every 8 (eight) hours as needed. Not to exceed 5 doses in 1 wk    Historical Provider, MD  diphenoxylate-atropine (LOMOTIL) 2.5-0.025 MG/5ML liquid Place into feeding tube 4 (four) times daily as needed for diarrhea or loose stools. 1-2 tsp via feeding tube q6h prn loose stools    Historical Provider, MD  docusate (COLACE) 50 MG/5ML liquid Take 50 mg by mouth daily as needed for mild constipation.    Historical Provider, MD  fluconazole (DIFLUCAN) 40 MG/ML suspension Take 2.5 mLs (100 mg total) by mouth daily. For 9 additional days 03/04/14   Caren Griffins, MD  folic acid (FOLVITE) 1 MG tablet Take 1 mg by mouth daily.    Historical Provider, MD  furosemide (LASIX) 10 MG/ML solution Place 20 mg into feeding tube daily as needed for fluid.     Historical Provider, MD  gabapentin (NEURONTIN) 250 MG/5ML solution Take 18 mL 3 times daily Patient taking differently: Place 825 mg into feeding tube 3 (three)  times daily. Take 18 mL 3 times daily 12/18/13   Jodi Geralds, MD  KEPPRA 100 MG/ML solution TAKE 17.5 ML'S BY MOUTH TWICE A DAY 01/14/14   Rockwell Germany, NP  lansoprazole (PREVACID SOLUTAB) 30 MG disintegrating tablet Take 30 mg by mouth 2 (two) times daily. 730am 09/07/12   Lafayette Dragon, MD  miconazole (MICOTIN) 2 % cream Apply topically 2 (two) times daily. 03/04/14   Costin Karlyne Greenspan, MD  midazolam (VERSED) 5 MG/ML injection Place 2 mLs (10 mg total) into the nose once. Draw up 76ml in 2 syringes. Remove blue vial access device. Attach syringe to nasal atomizer for intranasal administration. Give 20ml in right nostril x 2 for seizures lasting 2 minutes or longer  or for repetitive seizures in a short period of time. 11/05/13   Rockwell Germany, NP  Multiple Vitamin (MULTIVITAMIN) LIQD 10 mLs by PEG Tube route daily.    Historical Provider, MD  Multiple Vitamins-Minerals (ZINC PO) 5 mLs by PEG Tube route daily. 244 mg / 5 mL zinc solution    Historical Provider, MD  mupirocin ointment (BACTROBAN) 2 % Apply 1 application topically as needed (to G-T site). 09/07/12   Lafayette Dragon, MD  Nutritional Supplements (PROMOD PO) Take 30 mLs by mouth 2 (two) times daily. Noon and 10pm    Historical Provider, MD  Nutritional Supplements (TWOCAL HN) LIQD Take 237 mLs by mouth. T.3 can at 46 cc hour x 12 hours - water 172 cc pre and post each and extra 172 bid.    Historical Provider, MD  nystatin cream (MYCOSTATIN) Apply 3,299,242 application topically. 3-4 times daily 11/29/12   Samella Parr, NP  OVER THE COUNTER MEDICATION Apply 1 application topically as needed (after diaper changes). Balmex and Bag Balm paste made and applied after diaper changes    Historical Provider, MD  OXYGEN-HELIUM IN Inhale into the lungs. Oxygen PRN to keep O2 Sat at 90%    Historical Provider, MD  polyethylene glycol (MIRALAX / GLYCOLAX) packet Take 17 g by mouth daily.    Historical Provider, MD  Pseudoephedrine HCl (SUDAFED  CHILDRENS) 15 MG/5ML LIQD Give 90 mg by tube every 6 (six) hours.     Historical Provider, MD  simethicone (MYLICON) 683 MG chewable tablet Chew 125 mg by mouth every 6 (six) hours as needed for flatulence.    Historical Provider, MD  sodium phosphate (FLEET) enema Place 1 enema rectally. follow package directions PRN    Historical Provider, MD  sucralfate (CARAFATE) 1 GM/10ML suspension Place 1 g into feeding tube 4 (four) times daily. 7am, 130pm, 5pm, 8pm    Historical Provider, MD  VIMPAT 10 MG/ML SOLN Give 76ml by mouth twice per day 11/13/13   Rockwell Germany, NP  Water For Irrigation, Sterile (FREE WATER) SOLN Place 172 mLs into feeding tube 4 (four) times daily.    Historical Provider, MD  zinc oxide (BALMEX) 11.3 % CREA cream Apply 1 application topically 5 (five) times daily. With each diaper change.    Historical Provider, MD   BP 119/77 mmHg  Pulse 151  Resp 22  Ht 4\' 11"  (1.499 m)  Wt 129 lb (58.514 kg)  BMI 26.04 kg/m2  SpO2 98% Physical Exam  Constitutional:  Chronically ill-appearing  HENT:  Head: Normocephalic and atraumatic.  Eyes: Conjunctivae are normal. Pupils are equal, round, and reactive to light.  Neck: Neck supple. No tracheal deviation present. No thyromegaly present.  Cardiovascular: Normal rate and regular rhythm.   No murmur heard. Pulmonary/Chest: Effort normal and breath sounds normal.  Port-A-Cath at right chest, not red warm or tender at site.  Abdominal: Soft. Bowel sounds are normal. He exhibits no distension. There is no tenderness.  Gastrostomy tube in place  Genitourinary: Penis normal.  Musculoskeletal: Normal range of motion. He exhibits no edema or tenderness.  Flexion contractures all 4 extremities extremities are not redness warmth or swelling  Neurological: He is alert.  Nonverbal  Skin: Skin is warm and dry. No rash noted.  Nursing note and vitals reviewed.   ED Course  Procedures (including critical care time) Labs Review Labs  Reviewed - No data to display  Imaging Review No results found.   EKG Interpretation None  Date: 03/28/2014  Rate: 150  Rhythm: normal sinus rhythm and sinus tachycardia  QRS Axis: normal  Intervals: normal  ST/T Wave abnormalities: nonspecific T wave changes  Conduction Disutrbances:none  Narrative Interpretation:   Old EKG Reviewed: None available.MUSE not functioning 12:15 AM patient history resting comfortably after treatment with Tylenol, intravenous fluids and antibiotics. Chest x-ray viewed by me Results for orders placed or performed during the hospital encounter of 03/28/14  CBC with Differential  Result Value Ref Range   WBC 0.7 (LL) 4.0 - 10.5 K/uL   RBC 2.48 (L) 4.22 - 5.81 MIL/uL   Hemoglobin 7.7 (L) 13.0 - 17.0 g/dL   HCT 23.7 (L) 39.0 - 52.0 %   MCV 95.6 78.0 - 100.0 fL   MCH 31.0 26.0 - 34.0 pg   MCHC 32.5 30.0 - 36.0 g/dL   RDW 16.4 (H) 11.5 - 15.5 %   Platelets 102 (L) 150 - 400 K/uL   Neutrophils Relative % 44 43 - 77 %   Lymphocytes Relative 34 12 - 46 %   Monocytes Relative 22 (H) 3 - 12 %   Eosinophils Relative 0 0 - 5 %   Basophils Relative 0 0 - 1 %   Neutro Abs 0.3 (L) 1.7 - 7.7 K/uL   Lymphs Abs 0.2 (L) 0.7 - 4.0 K/uL   Monocytes Absolute 0.2 0.1 - 1.0 K/uL   Eosinophils Absolute 0.0 0.0 - 0.7 K/uL   Basophils Absolute 0.0 0.0 - 0.1 K/uL   RBC Morphology POLYCHROMASIA PRESENT   Comprehensive metabolic panel  Result Value Ref Range   Sodium 141 135 - 145 mmol/L   Potassium 3.8 3.5 - 5.1 mmol/L   Chloride 104 96 - 112 mmol/L   CO2 24 19 - 32 mmol/L   Glucose, Bld 110 (H) 70 - 99 mg/dL   BUN 16 6 - 23 mg/dL   Creatinine, Ser 0.62 0.50 - 1.35 mg/dL   Calcium 8.9 8.4 - 10.5 mg/dL   Total Protein 6.8 6.0 - 8.3 g/dL   Albumin 3.6 3.5 - 5.2 g/dL   AST 23 0 - 37 U/L   ALT 39 0 - 53 U/L   Alkaline Phosphatase 136 (H) 39 - 117 U/L   Total Bilirubin 0.6 0.3 - 1.2 mg/dL   GFR calc non Af Amer >90 >90 mL/min   GFR calc Af Amer >90 >90 mL/min    Anion gap 13 5 - 15  I-Stat CG4 Lactic Acid, ED  Result Value Ref Range   Lactic Acid, Venous 1.71 0.5 - 2.0 mmol/L  I-Stat CG4 Lactic Acid, ED (not at [facility])  Result Value Ref Range   Lactic Acid, Venous 1.30 0.5 - 2.0 mmol/L   Dg Chest Port 1 View  (if Code Sepsis Called)  03/28/2014   CLINICAL DATA:  Fever.  Code sepsis.  EXAM: PORTABLE CHEST - 1 VIEW  COMPARISON:  03/01/2014  FINDINGS: Shallow inspiration. Normal heart size and pulmonary vascularity. Power port type central venous catheter with tip over the cavoatrial junction. Linear atelectasis in the left lung base. No focal consolidation. No pneumothorax. No blunting of costophrenic angles. Postoperative changes with posterior rod fixation of the thoracic and visualized lumbar spine.  IMPRESSION: Shallow inspiration with linear atelectasis in the left lung base.   Electronically Signed   By: Lucienne Capers M.D.   On: 03/28/2014 23:07   Dg Chest Portable 1 View  03/01/2014   CLINICAL DATA:  Anemia. Chronic right lower lobe pneumonia. Neuro muscular disease.  EXAM: PORTABLE CHEST - 1 VIEW  COMPARISON:  11/06/2013  FINDINGS: Right jugular port appears unchanged in position. Spinal fixation hardware again noted.  There are low lung volumes. No airspace consolidation is evident. No large effusions are evident.  IMPRESSION: No acute findings   Electronically Signed   By: Andreas Newport M.D.   On: 03/01/2014 20:16    MDM  Code sepsis called based on Sirs criteria fever, tachycardia, respiratory rate.  Spoke with Dr. Hal Hope plan admit step down unit. Intravenous antibiotics. Pancultures. Patient is to be nothing by mouth. Neutropenic precautions Diagnosis #1 sepsis #2 neutropenia .#3 Thrombocytopenia #4 anemia Final diagnoses:  None   CRITICAL CARE Performed by: Orlie Dakin Total critical care time: 40 minute Critical care time was exclusive of separately billable procedures and treating other patients. Critical care was  necessary to treat or prevent imminent or life-threatening deterioration. Critical care was time spent personally by me on the following activities: development of treatment plan with patient and/or surrogate as well as nursing, discussions with consultants, evaluation of patient's response to treatment, examination of patient, obtaining history from patient or surrogate, ordering and performing treatments and interventions, ordering and review of laboratory studies, ordering and review of radiographic studies, pulse oximetry and re-evaluation of patient's condition.    Orlie Dakin, MD 03/29/14 [id number]

## 2014-03-28 NOTE — ED Notes (Signed)
Report to james, rn.  Pt care transferred 
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
## 2014-03-28 NOTE — ED Notes (Signed)
Per ems, called to patient residence for fever, r/o sepsis

## 2014-03-29 ENCOUNTER — Encounter (HOSPITAL_COMMUNITY): Payer: Medicaid Other

## 2014-03-29 ENCOUNTER — Encounter (HOSPITAL_COMMUNITY): Payer: Self-pay | Admitting: *Deleted

## 2014-03-29 ENCOUNTER — Ambulatory Visit (HOSPITAL_COMMUNITY): Payer: Medicaid Other | Admitting: Hematology & Oncology

## 2014-03-29 DIAGNOSIS — R6510 Systemic inflammatory response syndrome (SIRS) of non-infectious origin without acute organ dysfunction: Secondary | ICD-10-CM | POA: Diagnosis present

## 2014-03-29 DIAGNOSIS — D709 Neutropenia, unspecified: Secondary | ICD-10-CM

## 2014-03-29 DIAGNOSIS — R1310 Dysphagia, unspecified: Secondary | ICD-10-CM | POA: Diagnosis present

## 2014-03-29 DIAGNOSIS — Z931 Gastrostomy status: Secondary | ICD-10-CM | POA: Diagnosis not present

## 2014-03-29 DIAGNOSIS — D702 Other drug-induced agranulocytosis: Secondary | ICD-10-CM | POA: Diagnosis present

## 2014-03-29 DIAGNOSIS — Z79899 Other long term (current) drug therapy: Secondary | ICD-10-CM | POA: Diagnosis not present

## 2014-03-29 DIAGNOSIS — D696 Thrombocytopenia, unspecified: Secondary | ICD-10-CM

## 2014-03-29 DIAGNOSIS — D61818 Other pancytopenia: Secondary | ICD-10-CM

## 2014-03-29 DIAGNOSIS — G4730 Sleep apnea, unspecified: Secondary | ICD-10-CM | POA: Diagnosis present

## 2014-03-29 DIAGNOSIS — R5081 Fever presenting with conditions classified elsewhere: Secondary | ICD-10-CM | POA: Diagnosis present

## 2014-03-29 DIAGNOSIS — M4140 Neuromuscular scoliosis, site unspecified: Secondary | ICD-10-CM | POA: Diagnosis present

## 2014-03-29 DIAGNOSIS — A419 Sepsis, unspecified organism: Secondary | ICD-10-CM

## 2014-03-29 DIAGNOSIS — Z8673 Personal history of transient ischemic attack (TIA), and cerebral infarction without residual deficits: Secondary | ICD-10-CM | POA: Diagnosis not present

## 2014-03-29 DIAGNOSIS — G800 Spastic quadriplegic cerebral palsy: Secondary | ICD-10-CM | POA: Diagnosis present

## 2014-03-29 DIAGNOSIS — H35109 Retinopathy of prematurity, unspecified, unspecified eye: Secondary | ICD-10-CM | POA: Diagnosis present

## 2014-03-29 DIAGNOSIS — Z7951 Long term (current) use of inhaled steroids: Secondary | ICD-10-CM | POA: Diagnosis not present

## 2014-03-29 DIAGNOSIS — D649 Anemia, unspecified: Secondary | ICD-10-CM

## 2014-03-29 DIAGNOSIS — G808 Other cerebral palsy: Secondary | ICD-10-CM

## 2014-03-29 DIAGNOSIS — G709 Myoneural disorder, unspecified: Secondary | ICD-10-CM | POA: Diagnosis present

## 2014-03-29 DIAGNOSIS — T426X5A Adverse effect of other antiepileptic and sedative-hypnotic drugs, initial encounter: Secondary | ICD-10-CM | POA: Diagnosis present

## 2014-03-29 DIAGNOSIS — G40209 Localization-related (focal) (partial) symptomatic epilepsy and epileptic syndromes with complex partial seizures, not intractable, without status epilepticus: Secondary | ICD-10-CM | POA: Diagnosis present

## 2014-03-29 DIAGNOSIS — M2450 Contracture, unspecified joint: Secondary | ICD-10-CM | POA: Diagnosis present

## 2014-03-29 DIAGNOSIS — E871 Hypo-osmolality and hyponatremia: Secondary | ICD-10-CM | POA: Diagnosis present

## 2014-03-29 DIAGNOSIS — N179 Acute kidney failure, unspecified: Secondary | ICD-10-CM | POA: Diagnosis present

## 2014-03-29 LAB — GLUCOSE, CAPILLARY
Glucose-Capillary: 92 mg/dL (ref 70–99)
Glucose-Capillary: 87 mg/dL (ref 70–99)
Glucose-Capillary: 94 mg/dL (ref 70–99)
Glucose-Capillary: 82 mg/dL (ref 70–99)

## 2014-03-29 LAB — URINALYSIS, ROUTINE W REFLEX MICROSCOPIC
BILIRUBIN URINE: NEGATIVE
GLUCOSE, UA: NEGATIVE mg/dL
Hgb urine dipstick: NEGATIVE
KETONES UR: NEGATIVE mg/dL
Leukocytes, UA: NEGATIVE
Nitrite: NEGATIVE
Protein, ur: 100 mg/dL — AB
Specific Gravity, Urine: 1.03 (ref 1.005–1.030)
Urobilinogen, UA: 0.2 mg/dL (ref 0.0–1.0)
pH: 5.5 (ref 5.0–8.0)

## 2014-03-29 LAB — COMPREHENSIVE METABOLIC PANEL
ALBUMIN: 2.7 g/dL — AB (ref 3.5–5.2)
ALK PHOS: 98 U/L (ref 39–117)
ALT: 29 U/L (ref 0–53)
AST: 19 U/L (ref 0–37)
Anion gap: 9 (ref 5–15)
BILIRUBIN TOTAL: 0.9 mg/dL (ref 0.3–1.2)
BUN: 12 mg/dL (ref 6–23)
CHLORIDE: 105 mmol/L (ref 96–112)
CO2: 27 mmol/L (ref 19–32)
Calcium: 7.8 mg/dL — ABNORMAL LOW (ref 8.4–10.5)
Creatinine, Ser: 0.58 mg/dL (ref 0.50–1.35)
GFR calc Af Amer: >90 mL/min (ref >90)
GFR calc non Af Amer: >90 mL/min (ref >90)
Glucose, Bld: 102 mg/dL — ABNORMAL HIGH (ref 70–99)
POTASSIUM: 3.3 mmol/L — AB (ref 3.5–5.1)
Sodium: 141 mmol/L (ref 135–145)
Total Protein: 5.5 g/dL — ABNORMAL LOW (ref 6.0–8.3)

## 2014-03-29 LAB — CBC WITH DIFFERENTIAL/PLATELET
BASOS ABS: 0 10*3/uL (ref 0.0–0.1)
Basophils Relative: 2 % — ABNORMAL HIGH (ref 0–1)
EOS ABS: 0 10*3/uL (ref 0.0–0.7)
Eosinophils Relative: 0 % (ref 0–5)
HCT: 17.7 % — ABNORMAL LOW (ref 39.0–52.0)
HEMOGLOBIN: 5.6 g/dL — AB (ref 13.0–17.0)
Lymphocytes Relative: 40 % (ref 12–46)
Lymphs Abs: 0.2 10*3/uL — ABNORMAL LOW (ref 0.7–4.0)
MCH: 30.6 pg (ref 26.0–34.0)
MCHC: 31.6 g/dL (ref 30.0–36.0)
MCV: 96.7 fL (ref 78.0–100.0)
MONO ABS: 0.1 10*3/uL (ref 0.1–1.0)
Monocytes Relative: 18 % — ABNORMAL HIGH (ref 3–12)
Neutro Abs: 0.3 10*3/uL — ABNORMAL LOW (ref 1.7–7.7)
Neutrophils Relative %: 40 % — ABNORMAL LOW (ref 43–77)
PLATELETS: 85 10*3/uL — AB (ref 150–400)
RBC: 1.83 MIL/uL — ABNORMAL LOW (ref 4.22–5.81)
RDW: 16.5 % — ABNORMAL HIGH (ref 11.5–15.5)
WBC: 0.6 10*3/uL — CL (ref 4.0–10.5)

## 2014-03-29 LAB — PROTIME-INR
INR: 1.34 (ref 0.00–1.49)
PROTHROMBIN TIME: 16.7 s — AB (ref 11.6–15.2)

## 2014-03-29 LAB — SAVE SMEAR

## 2014-03-29 LAB — INFLUENZA PANEL BY PCR (TYPE A & B)
H1N1 flu by pcr: NOT DETECTED
INFLAPCR: NEGATIVE
Influenza B By PCR: NEGATIVE

## 2014-03-29 LAB — MRSA PCR SCREENING: MRSA BY PCR: NEGATIVE

## 2014-03-29 LAB — PATHOLOGIST SMEAR REVIEW

## 2014-03-29 LAB — IRON AND TIBC: UIBC: 165 ug/dL (ref 125–400)

## 2014-03-29 LAB — RETICULOCYTES
RBC.: 1.74 MIL/uL — AB (ref 4.22–5.81)
RETIC COUNT ABSOLUTE: 20.9 10*3/uL (ref 19.0–186.0)
Retic Ct Pct: 1.2 % (ref 0.4–3.1)

## 2014-03-29 LAB — I-STAT CG4 LACTIC ACID, ED
Lactic Acid, Venous: 1.22 mmol/L (ref 0.5–2.0)
Lactic Acid, Venous: 1.3 mmol/L (ref 0.5–2.0)

## 2014-03-29 LAB — VITAMIN B12: VITAMIN B 12: 539 pg/mL (ref 211–911)

## 2014-03-29 LAB — PREPARE RBC (CROSSMATCH)

## 2014-03-29 LAB — FERRITIN: FERRITIN: 577 ng/mL — AB (ref 22–322)

## 2014-03-29 LAB — LACTATE DEHYDROGENASE: LDH: 112 U/L (ref 94–250)

## 2014-03-29 LAB — URINE MICROSCOPIC-ADD ON

## 2014-03-29 LAB — FOLATE

## 2014-03-29 MED ORDER — CHLORHEXIDINE GLUCONATE 0.12 % MT SOLN
15.0000 mL | Freq: Two times a day (BID) | OROMUCOSAL | Status: DC
  Filled 2014-03-29 (×8): qty 15

## 2014-03-29 MED ORDER — FREE WATER
172.0000 mL | Freq: Four times a day (QID) | Status: DC
  Administered 2014-03-31 – 2014-04-01 (×2): 172 mL

## 2014-03-29 MED ORDER — SODIUM CHLORIDE 0.9 % IV SOLN: 510.0000 mg | Freq: Once | INTRAVENOUS | Status: DC

## 2014-03-29 MED ORDER — DICYCLOMINE HCL 10 MG/5ML PO SOLN
10.0000 mg | Freq: Three times a day (TID) | ORAL | Status: DC | PRN
  Administered 2014-03-30 – 2014-03-31 (×3): 10 mg
  Filled 2014-03-29 (×7): qty 5

## 2014-03-29 MED ORDER — SODIUM CHLORIDE 0.9 % IV SOLN: INTRAVENOUS | Status: DC

## 2014-03-29 MED ORDER — VANCOMYCIN HCL IN DEXTROSE 1-5 GM/200ML-% IV SOLN
1000.0000 mg | Freq: Three times a day (TID) | INTRAVENOUS | Status: DC
  Filled 2014-03-29 (×2): qty 200

## 2014-03-29 MED ORDER — ONDANSETRON HCL 4 MG/2ML IJ SOLN: 4.0000 mg | Freq: Four times a day (QID) | INTRAMUSCULAR | Status: DC | PRN

## 2014-03-29 MED ORDER — DIPHENHYDRAMINE HCL 50 MG/ML IJ SOLN
25.0000 mg | Freq: Once | INTRAMUSCULAR | Status: AC
  Administered 2014-03-29: 25 mg via INTRAVENOUS
  Filled 2014-03-29: qty 1

## 2014-03-29 MED ORDER — CETYLPYRIDINIUM CHLORIDE 0.05 % MT LIQD
7.0000 mL | Freq: Two times a day (BID) | OROMUCOSAL | Status: DC
  Administered 2014-03-30 (×2): 7 mL via OROMUCOSAL

## 2014-03-29 MED ORDER — PRO-STAT SUGAR FREE PO LIQD
30.0000 mL | Freq: Two times a day (BID) | ORAL | Status: DC
  Administered 2014-03-31 (×2): 30 mL
  Filled 2014-03-29 (×7): qty 30

## 2014-03-29 MED ORDER — VANCOMYCIN HCL IN DEXTROSE 1-5 GM/200ML-% IV SOLN
1000.0000 mg | Freq: Three times a day (TID) | INTRAVENOUS | Status: DC
  Administered 2014-03-29 (×2): 1000 mg via INTRAVENOUS
  Filled 2014-03-29 (×3): qty 200

## 2014-03-29 MED ORDER — CALCIUM CARBONATE 1250 MG/5ML PO SUSP
1250.0000 mg | Freq: Three times a day (TID) | ORAL | Status: DC
  Administered 2014-03-30 – 2014-04-01 (×3): 1250 mg
  Filled 2014-03-29 (×13): qty 15

## 2014-03-29 MED ORDER — PIPERACILLIN-TAZOBACTAM 3.375 G IVPB: 3.3750 g | Freq: Three times a day (TID) | INTRAVENOUS | Status: DC

## 2014-03-29 MED ORDER — FILGRASTIM 480 MCG/1.6ML IJ SOLN
480.0000 ug | Freq: Every day | INTRAMUSCULAR | Status: DC
  Administered 2014-03-30 – 2014-04-01 (×3): 480 ug via SUBCUTANEOUS
  Filled 2014-03-29 (×4): qty 1.6

## 2014-03-29 MED ORDER — LEVALBUTEROL HCL 0.63 MG/3ML IN NEBU
0.6300 mg | INHALATION_SOLUTION | Freq: Four times a day (QID) | RESPIRATORY_TRACT | Status: DC
  Administered 2014-03-29 – 2014-04-01 (×8): 0.63 mg via RESPIRATORY_TRACT
  Filled 2014-03-29 (×13): qty 3

## 2014-03-29 MED ORDER — SIMETHICONE 80 MG PO CHEW
120.0000 mg | CHEWABLE_TABLET | Freq: Three times a day (TID) | ORAL | Status: DC
  Administered 2014-03-30 – 2014-04-01 (×3): 120 mg via ORAL
  Filled 2014-03-29 (×11): qty 2

## 2014-03-29 MED ORDER — ONDANSETRON HCL 4 MG PO TABS: 4.0000 mg | ORAL_TABLET | Freq: Four times a day (QID) | ORAL | Status: DC | PRN

## 2014-03-29 MED ORDER — ADULT MULTIVITAMIN LIQUID CH
10.0000 mL | Freq: Every day | ORAL | Status: DC
  Administered 2014-03-31 – 2014-04-01 (×2): 10 mL
  Filled 2014-03-29 (×4): qty 10

## 2014-03-29 MED ORDER — MIDAZOLAM 5 MG/ML PEDIATRIC INJ FOR INTRANASAL/SUBLINGUAL USE: 10.0000 mg | Freq: Once | INTRAMUSCULAR | Status: AC | PRN

## 2014-03-29 MED ORDER — FLUCONAZOLE 40 MG/ML PO SUSR
100.0000 mg | Freq: Every day | ORAL | Status: DC
  Administered 2014-03-30 – 2014-03-31 (×2): 100 mg via ORAL
  Filled 2014-03-29 (×3): qty 2.5

## 2014-03-29 MED ORDER — ACETAMINOPHEN 325 MG PO TABS: 650.0000 mg | ORAL_TABLET | Freq: Once | ORAL | Status: DC

## 2014-03-29 MED ORDER — DEXTROSE 5 % IV SOLN
2.0000 g | Freq: Three times a day (TID) | INTRAVENOUS | Status: DC
  Administered 2014-03-29: 2 g via INTRAVENOUS
  Filled 2014-03-29 (×3): qty 2

## 2014-03-29 MED ORDER — SUCRALFATE 1 GM/10ML PO SUSP
1.0000 g | ORAL | Status: DC
  Administered 2014-03-30 – 2014-04-01 (×4): 1 g
  Filled 2014-03-29 (×17): qty 10

## 2014-03-29 MED ORDER — NON FORMULARY: 46.0000 mL/h | Status: DC

## 2014-03-29 MED ORDER — LEVALBUTEROL HCL 0.63 MG/3ML IN NEBU
0.6300 mg | INHALATION_SOLUTION | Freq: Four times a day (QID) | RESPIRATORY_TRACT | Status: DC | PRN
  Administered 2014-03-29 – 2014-03-31 (×5): 0.63 mg via RESPIRATORY_TRACT
  Filled 2014-03-29 (×5): qty 3

## 2014-03-29 MED ORDER — ZINC OXIDE 11.3 % EX CREA
1.0000 "application " | TOPICAL_CREAM | Freq: Every day | CUTANEOUS | Status: DC
  Filled 2014-03-29 (×2): qty 56

## 2014-03-29 MED ORDER — FILGRASTIM 300 MCG/ML IJ SOLN
300.0000 ug | Freq: Every day | INTRAMUSCULAR | Status: DC
  Administered 2014-03-29: 300 ug via SUBCUTANEOUS
  Filled 2014-03-29 (×3): qty 1

## 2014-03-29 MED ORDER — FERUMOXYTOL INJECTION 510 MG/17 ML
510.0000 mg | Freq: Once | INTRAVENOUS | Status: AC
  Administered 2014-03-30: 510 mg via INTRAVENOUS
  Filled 2014-03-29 (×2): qty 17

## 2014-03-29 MED ORDER — ACETAMINOPHEN 325 MG PO TABS
650.0000 mg | ORAL_TABLET | Freq: Four times a day (QID) | ORAL | Status: DC | PRN
Start: 2014-03-29 — End: 2014-04-01

## 2014-03-29 MED ORDER — LANSOPRAZOLE 15 MG PO TBDP
30.0000 mg | ORAL_TABLET | Freq: Two times a day (BID) | ORAL | Status: DC
  Administered 2014-03-30 – 2014-04-01 (×4): 30 mg
  Filled 2014-03-29 (×10): qty 2

## 2014-03-29 MED ORDER — DIPHENOXYLATE-ATROPINE 2.5-0.025 MG/5ML PO LIQD: 5.0000 mL | Freq: Four times a day (QID) | ORAL | Status: DC | PRN

## 2014-03-29 MED ORDER — LEVETIRACETAM 100 MG/ML PO SOLN
1750.0000 mg | Freq: Two times a day (BID) | ORAL | Status: DC
  Administered 2014-03-29: 1750 mg
  Filled 2014-03-29 (×2): qty 20

## 2014-03-29 MED ORDER — ACETAMINOPHEN 650 MG RE SUPP
650.0000 mg | Freq: Four times a day (QID) | RECTAL | Status: DC | PRN
Start: 2014-03-29 — End: 2014-04-01
  Administered 2014-03-29: 650 mg via RECTAL
  Filled 2014-03-29: qty 1

## 2014-03-29 MED ORDER — MUPIROCIN 2 % EX OINT: 1.0000 "application " | TOPICAL_OINTMENT | CUTANEOUS | Status: DC | PRN

## 2014-03-29 MED ORDER — GABAPENTIN 250 MG/5ML PO SOLN
900.0000 mg | Freq: Three times a day (TID) | ORAL | Status: DC
  Administered 2014-03-31 – 2014-04-01 (×2): 900 mg
  Filled 2014-03-29 (×14): qty 18

## 2014-03-29 MED ORDER — NONFORMULARY OR COMPOUNDED ITEM
560.0000 mL | Status: DC
  Administered 2014-03-31 – 2014-04-01 (×2): 560 mL

## 2014-03-29 MED ORDER — FUROSEMIDE 10 MG/ML IJ SOLN
20.0000 mg | Freq: Once | INTRAMUSCULAR | Status: AC
  Administered 2014-03-29: 20 mg via INTRAVENOUS
  Filled 2014-03-29: qty 2

## 2014-03-29 MED ORDER — DEXTROSE-NACL 5-0.9 % IV SOLN
INTRAVENOUS | Status: DC
  Administered 2014-03-29 – 2014-03-31 (×3): via INTRAVENOUS

## 2014-03-29 MED ORDER — DIAZEPAM 10 MG RE GEL: 12.5000 mg | RECTAL | Status: DC | PRN

## 2014-03-29 MED ORDER — FOLIC ACID 1 MG PO TABS
1.0000 mg | ORAL_TABLET | Freq: Every day | ORAL | Status: DC
  Administered 2014-03-31 – 2014-04-01 (×2): 1 mg via ORAL
  Filled 2014-03-29 (×4): qty 1

## 2014-03-29 MED ORDER — VITAMIN C 500 MG/5ML PO SYRP
300.0000 mg | ORAL_SOLUTION | Freq: Every day | ORAL | Status: DC
  Administered 2014-03-31: 300 mg
  Filled 2014-03-29 (×4): qty 3

## 2014-03-29 MED ORDER — SODIUM CHLORIDE 0.9 % IV SOLN
Freq: Once | INTRAVENOUS | Status: AC
  Administered 2014-03-29: 120 mL via INTRAVENOUS

## 2014-03-29 MED ORDER — SODIUM CHLORIDE 0.9 % IV SOLN: 1020.0000 mg | Freq: Once | INTRAVENOUS | Status: DC

## 2014-03-29 MED ORDER — BACLOFEN 40000 MCG/20ML IT SOLN: 385.2000 ug | INTRATHECAL | Status: DC

## 2014-03-29 MED ORDER — DEXTROSE 5 % IV SOLN
2.0000 g | Freq: Three times a day (TID) | INTRAVENOUS | Status: DC
  Administered 2014-03-29 – 2014-03-30 (×3): 2 g via INTRAVENOUS
  Filled 2014-03-29 (×5): qty 2

## 2014-03-29 MED ORDER — DIPHENOXYLATE-ATROPINE 2.5-0.025 MG/5ML PO LIQD
5.0000 mL | Freq: Four times a day (QID) | ORAL | Status: DC | PRN
  Administered 2014-03-31: 5 mL
  Filled 2014-03-29: qty 5

## 2014-03-29 MED ORDER — ACETAMINOPHEN 160 MG/5ML PO SOLN
650.0000 mg | Freq: Four times a day (QID) | ORAL | Status: DC | PRN
  Administered 2014-03-30 – 2014-03-31 (×3): 650 mg
  Filled 2014-03-29 (×2): qty 20.3

## 2014-03-29 MED ORDER — SODIUM CHLORIDE 0.9 % IV SOLN
INTRAVENOUS | Status: AC
  Administered 2014-03-29: 06:00:00 via INTRAVENOUS

## 2014-03-29 NOTE — H&P (Signed)
[facility] History and Physical  Jeffery [name] HUD:[id number] DOB: February 27, 1990 DOA: 03/28/2014  Referring physician: ER physician. PCP: Jeffery Bravo, MD   History obtained from previous records and patient's legal guardian.  Chief Complaint: Fever.  HPI: Jeffery [name] is a 24 y.o. male with history of cerebral palsy, spastic quadriplegia, seizure disorder, history of intraventricular hemorrhage who was recently admitted for pancytopenia and plan is to get bone marrow biopsy soon was found to have fever around last evening 7 PM. Patient did not have any shortness of breath nausea vomiting abdominal pain or any worsening of diarrhea. Did not have any productive cough. Patient was recently discharged from the hospital and only had gone out of his house yesterday to get blood drawn for type and screen for possible transfusion tomorrow for his anemia. This was done through his Port-A-Cath access. In the ER patient was found be febrile and tachycardic. Blood work show pancytopenia with severe neutropenia. Patient is not very communicative to provide history. Patient has been admitted for further management. Chest x-ray is unremarkable and urinalysis pending.   Review of Systems: As presented in the history of presenting illness, rest negative.  Past Medical History  Diagnosis Date  . CP (cerebral palsy), spastic, quadriplegic   . Epilepsy   . Osteoporosis   . Inguinal hernia   . Undescended testes   . Seasonal allergies   . IVH (intraventricular hemorrhage)     Grade IV  . Hip dislocation, bilateral   . Seizures   . Hx: UTI (urinary tract infection)   . Dysphagia   . Otitis media   . Retinopathy of prematurity   . 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 Neuromuscular scoliosis   . Osteoporosis   . Complex partial seizures   . Generalized convulsive epilepsy without mention of intractable epilepsy   . Epilepsy   . Sinus bradycardia     HR drops to 38-40 while  sleeping  . Sleep apnea     BiPAP  . Blister of right heel     fluid filled; origin unknown  . Kidney stones     ?  Marland Kitchen Pneumonia      chronic pneumonia ,respitory failure dx Augest 2014   Past Surgical History  Procedure Laterality Date  . Excision of moles    . Peg placement      With multiple changes  . Gastrostomy tube, change / reposition  12/15/2010       . Button change  12/15/2010    Procedure: BUTTON CHANGE;  Surgeon: Gatha Mayer, MD;  Location: [facility];  Service: Endoscopy;  Laterality: N/A;  . Peg placement  10/07/2011    Procedure: PERCUTANEOUS ENDOSCOPIC GASTROSTOMY (PEG) REPLACEMENT;  Surgeon: Lafayette Dragon, MD;  Location: [facility];  Service: Endoscopy;  Laterality: N/A;  Needs 18 F 2.5 button ordered-dl  . Inguinal hernia repair Bilateral 1992  . Retinopathy of prematurity surgery  1992  . Achilles tendon lengthening  12/1998  . Soft tissue releases  wrists and fingers  12/1998  . Intrathecal baclofen pump placement  07/25/2000  . Peg placement N/A 06/05/2012    Procedure: PERCUTANEOUS ENDOSCOPIC GASTROSTOMY (PEG) REPLACEMENT;  Surgeon: Lafayette Dragon, MD;  Location: [facility];  Service: Endoscopy;  Laterality: N/A;  button 47f.2.5cm  . Eye surgery Bilateral     Retinal   . Back surgery      Harrington Rods in back needs to be log rolled  . Tendon repair  Bilateral 09/05/2012    Procedure: LENGTHENING OF DIGITAL FLEXOR TENDONS BILTERAL HANDS;  Surgeon: Jolyn Nap, MD;  Location: [place];  Service: Orthopedics;  Laterality: Bilateral;  . Hand surgery Bilateral Aug. 2014  . Peg placement N/A 09/13/2012    Procedure: PERCUTANEOUS ENDOSCOPIC GASTROSTOMY (PEG) REPLACEMENT;  Surgeon: Lafayette Dragon, MD;  Location: [facility];  Service: Endoscopy;  Laterality: N/A;  . Flexible sigmoidoscopy N/A 10/30/2012    Procedure: FLEXIBLE SIGMOIDOSCOPY;  Surgeon: Cleotis Nipper, MD;  Location: [facility];  Service: Endoscopy;  Laterality: N/A;  . Peg placement N/A  11/15/2012    Procedure: PERCUTANEOUS ENDOSCOPIC GASTROSTOMY (PEG) REPLACEMENT;  Surgeon: Jeryl Columbia, MD;  Location: [facility];  Service: Endoscopy;  Laterality: N/A;   Social History:  reports that he has never smoked. He has never used smokeless tobacco. He reports that he does not drink alcohol or use illicit drugs. Where does patient live home. Can patient participate in ADLs? No.  Allergies  Allergen Reactions  . Adhesive [Tape]     Rips skin off  . Depakote [Divalproex Sodium]     Causes pancreatitis   . Amoxicillin-Pot Clavulanate Rash    Family History:  Family History  Problem Relation Age of Onset  . Adopted: Yes      Prior to Admission medications   Medication Sig Start Date End Date Taking? Authorizing Provider  albuterol (PROVENTIL HFA;VENTOLIN HFA) 108 (90 BASE) MCG/ACT inhaler Inhale 2 puffs into the lungs every 4 (four) hours as needed for shortness of breath.   Yes Historical Provider, MD  albuterol (PROVENTIL) (2.5 MG/3ML) 0.083% nebulizer solution Take 2.5 mg by nebulization. 1 ampule twice daily and as needed (making 4 doses daily) 10/11/12  Yes Elsie Stain, MD  Ascorbic Acid (VITAMIN C) 500 MG/5ML LIQD 300 mg by PEG Tube route at bedtime.    Yes Historical Provider, MD  baclofen (GABLOFEN) 40000 MCG/20ML SOLN 385.2 mcg by Intrathecal route continuous.  05/04/12  Yes Jodi Geralds, MD  Botulinum Toxin Type A, Cosm, (BOTOX COSMETIC) 50 UNITS SOLR Inject 300 Units into the muscle.   Yes Historical Provider, MD  calcium carbonate, dosed in mg elemental calcium, 1250 MG/5ML 1,250 mg of elemental calcium by PEG Tube route 3 (three) times daily. 5 cc via peg tube   Yes Historical Provider, MD  diazepam (DIASTAT ACUDIAL) 10 MG GEL Place 12.5 mg rectally as needed for seizure.    Yes Historical Provider, MD  dicyclomine (BENTYL) 10 MG/5ML syrup Place into feeding tube every 8 (eight) hours as needed. Not to exceed 5 doses in 1 wk   Yes Historical Provider, MD   diphenoxylate-atropine (LOMOTIL) 2.5-0.025 MG/5ML liquid Place into feeding tube 4 (four) times daily as needed for diarrhea or loose stools. 1-2 tsp via feeding tube q6h prn loose stools   Yes Historical Provider, MD  folic acid (FOLVITE) 1 MG tablet Take 1 mg by mouth daily.   Yes Historical Provider, MD  furosemide (LASIX) 10 MG/ML solution Place 20 mg into feeding tube daily as needed for fluid.    Yes Historical Provider, MD  gabapentin (NEURONTIN) 250 MG/5ML solution Take 18 mL 3 times daily Patient taking differently: Place 825 mg into feeding tube 3 (three) times daily. Take 18 mL 3 times daily 12/18/13  Yes Jodi Geralds, MD  KEPPRA 100 MG/ML solution TAKE 17.5 ML'S BY MOUTH TWICE A DAY 01/14/14  Yes Rockwell Germany, NP  ketoconazole (NIZORAL) 2 % cream Apply 1 application topically  2 (two) times daily as needed for irritation.   Yes Historical Provider, MD  lansoprazole (PREVACID SOLUTAB) 30 MG disintegrating tablet Take 30 mg by mouth 2 (two) times daily. 730am 09/07/12  Yes Lafayette Dragon, MD  midazolam (VERSED) 5 MG/ML injection Place 2 mLs (10 mg total) into the nose once. Draw up 12m in 2 syringes. Remove blue vial access device. Attach syringe to nasal atomizer for intranasal administration. Give 112min right nostril x 2 for seizures lasting 2 minutes or longer or for repetitive seizures in a short period of time. 11/05/13  Yes TiRockwell GermanyNP  Multiple Vitamin (MULTIVITAMIN) LIQD 10 mLs by PEG Tube route daily.   Yes Historical Provider, MD  Multiple Vitamins-Minerals (ZINC PO) 5 mLs by PEG Tube route daily. 244 mg / 5 mL zinc solution   Yes Historical Provider, MD  mupirocin ointment (BACTROBAN) 2 % Apply 1 application topically as needed (to G-T site). 09/07/12  Yes DoLafayette DragonMD  Nutritional Supplements (PROMOD PO) Take 30 mLs by mouth 2 (two) times daily. Noon and 10pm   Yes Historical Provider, MD  Nutritional Supplements (TWOCAL HN) LIQD Take 237 mLs by mouth. T.3 can  at 46 cc hour x 12 hours - water 172 cc pre and post each and extra 172 bid.   Yes Historical Provider, MD  OVER THE COUNTER MEDICATION Apply 1 application topically as needed (after diaper changes). Balmex and Bag Balm paste made and applied after diaper changes   Yes Historical Provider, MD  OXYGEN-HELIUM IN Inhale into the lungs. Oxygen PRN to keep O2 Sat at 90%   Yes Historical Provider, MD  Pseudoephedrine HCl (SUDAFED CHILDRENS) 15 MG/5ML LIQD Give 90 mg by tube every 6 (six) hours.    Yes Historical Provider, MD  simethicone (MYLICON) 12169G chewable tablet Chew 125 mg by mouth every 6 (six) hours as needed for flatulence.   Yes Historical Provider, MD  sodium phosphate (FLEET) enema Place 1 enema rectally. follow package directions PRN   Yes Historical Provider, MD  sucralfate (CARAFATE) 1 GM/10ML suspension Place 1 g into feeding tube 4 (four) times daily. 7am, 130pm, 5pm, 8pm   Yes Historical Provider, MD  Water For Irrigation, Sterile (FREE WATER) SOLN Place 172 mLs into feeding tube 4 (four) times daily.   Yes Historical Provider, MD  zinc oxide (BALMEX) 11.3 % CREA cream Apply 1 application topically 5 (five) times daily. With each diaper change.   Yes Historical Provider, MD  docusate (COLACE) 50 MG/5ML liquid Take 50 mg by mouth daily as needed for mild constipation.    Historical Provider, MD  fluconazole (DIFLUCAN) 40 MG/ML suspension Take 2.5 mLs (100 mg total) by mouth daily. For 9 additional days Patient not taking: Reported on 03/29/2014 03/04/14   CoCaren GriffinsMD  miconazole (MICOTIN) 2 % cream Apply topically 2 (two) times daily. Patient not taking: Reported on 03/29/2014 03/04/14   CoCaren GriffinsMD  nystatin cream (MYCOSTATIN) Apply 1,6,789,381pplication topically. 3-4 times daily 11/29/12   AlSamella ParrNP  polyethylene glycol (MIRALAX / GLYCOLAX) packet Take 17 g by mouth daily.    Historical Provider, MD  VIMPAT 10 MG/ML SOLN Give 36m33my mouth twice per day Patient  not taking: Reported on 03/28/2014 11/13/13   TinRockwell GermanyP    Physical Exam: Filed Vitals:   03/29/14 0000 03/29/14 0030 03/29/14 0100 03/29/14 0145  BP: 108/57 108/51 112/57 115/65  Pulse: 138 141 134  Temp:  101.6 F (38.7 C)    TempSrc:  Rectal    Resp: _0 Height:      Weight:      SpO2: 100% 100% 99% 99%     General: Poorly built and moderately nourished.  Eyes: Anicteric no pallor.  ENT: No discharge from the ears eyes nose and mouth.  Neck: No mass felt.  Cardiovascular: S1-S2 heard.  Respiratory: No rhonchi or crepitations.  Abdomen: Soft nontender PEG tube in place.  Skin: No obvious rash.  Musculoskeletal: No edema.  Psychiatric: Patient is not very communicative.  Neurologic: Alert awake but patient is not communicative with history of spastic quadriplegia.  Labs on Admission:  Basic Metabolic Panel:  Recent Labs Lab 03/28/14 2231  NA 141  K 3.8  CL 104  CO2 24  GLUCOSE 110*  BUN 16  CREATININE 0.62  CALCIUM 8.9   Liver Function Tests:  Recent Labs Lab 03/28/14 2231  AST 23  ALT 39  ALKPHOS 136*  BILITOT 0.6  PROT 6.8  ALBUMIN 3.6   No results for input(s): LIPASE, AMYLASE in the last 168 hours. No results for input(s): AMMONIA in the last 168 hours. CBC:  Recent Labs Lab 03/27/14 1445 03/28/14 2231  WBC 1.6* 0.7*  NEUTROABS 0.9* 0.3*  HGB 8.4* 7.7*  HCT 26.5* 23.7*  MCV 97.1 95.6  PLT 113* 102*   Cardiac Enzymes: No results for input(s): CKTOTAL, CKMB, CKMBINDEX, TROPONINI in the last 168 hours.  BNP (last 3 results) No results for input(s): BNP in the last 8760 hours.  ProBNP (last 3 results) No results for input(s): PROBNP in the last 8760 hours.  CBG: No results for input(s): GLUCAP in the last 168 hours.  Radiological Exams on Admission: Dg Chest Port 1 View  (if Code Sepsis Called)  03/28/2014   CLINICAL DATA:  Fever.  Code sepsis.  EXAM: PORTABLE CHEST - 1 VIEW  COMPARISON:  03/01/2014   FINDINGS: Shallow inspiration. Normal heart size and pulmonary vascularity. Power port type central venous catheter with tip over the cavoatrial junction. Linear atelectasis in the left lung base. No focal consolidation. No pneumothorax. No blunting of costophrenic angles. Postoperative changes with posterior rod fixation of the thoracic and visualized lumbar spine.  IMPRESSION: Shallow inspiration with linear atelectasis in the left lung base.   Electronically Signed   By: Lucienne Capers M.D.   On: 03/28/2014 23:07     Assessment/Plan Principal Problem:   SIRS (systemic inflammatory response syndrome) Active Problems:   Congenital spastic quadriplegia   Pancytopenia   Neutropenic fever   1. SIRS - source not clear. Urinalysis is pending. Follow blood cultures and urine cultures. Patient did have his Port-A-Cath access yesterday. At this time given patient's severe neutropenia patient has been placed on empiric antibiotics vancomycin and cefepime. We will also check influenza PCR. 2. Neutropenic fever with pancytopenia -patient has been placed on empiric antibiotics and hematologist is only planning bone marrow biopsy. Consult hematologist in a.m. 3. Anemia - type and screen and if there is any further worsening of hemoglobin may need transfusion. 4. History of seizure with history of cerebral palsy and spastic quadriplegia - patient's Vimpat was recently discontinued and patient presently is on Keppra.    DVT ProphylaxisSCDs. Patient has thrombocytopenia.  Code Status: Full code.  Family Communication: Patient's legal guardian.  Disposition Plan: Admit to inpatient.    Sherron Mapp N. [facility] Pager [phone].  If 7PM-7AM, please contact night-coverage [email or web address] Password  [email or web address] 03/29/2014, 2:34 AM

## 2014-03-29 NOTE — Progress Notes (Signed)
PATIENT DETAILS Name: Jeffery [name] Age: 24 y.o. Sex: male Date of Birth: Oct 20, 1990 Admit Date: 03/28/2014 Admitting Physician Rise Patience, MD NGE:[email or web address], DAVID C, MD  Subjective: Mother at bedside, febrile this am as well. No recent hx of nausea, vomiting, diarrhea, Cough or SOB. No cellulitis  Assessment/Plan: Principal Problem:   SIRS (systemic inflammatory response syndrome):secondary to neutropenic fever. Continue IVF and IV Abx. Follow cultures.  Active Problems:   Neutropenic Fever: foci of infection not apparent. Await cultures. Continue empiric IV Abx. Have consulted Hematology-suspect will require Neulasta/Neupogen. Reviewed prior notes, subsequently spoke with Dr Eduard Clos was planning for a bone marrow bx (mother refused last admit). I will consult [name] who is on call-awaiting call back.Continue neutropenic precautions  Pancytopenia:with worsening anemia/leukopenia predominantly.Had exrensive work last admit-all negative. Bone marrow bx was offered but refused by family. Subsequently family consented and plans were to do as outpatient. Some suspicion that this from [place], but need Bone marrow to rule AML etc.     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    Seizure disorder: continue anti-epileptics  Disposition: Remain inpatient  Antibiotics: See belwo  Anti-infectives    Start     Dose/Rate Route Frequency Ordered Stop   03/29/14 0800  vancomycin (VANCOCIN) IVPB 1000 mg/200 mL premix     1,000 mg 200 mL/hr over 60 Minutes Intravenous Every 8 hours 03/29/14 0037     03/29/14 0600  piperacillin-tazobactam (ZOSYN) IVPB 3.375 g  Status:  Discontinued     3.375 g 12.5 mL/hr over 240 Minutes Intravenous Every 8 hours 03/29/14 0037 03/29/14 0249   03/29/14 0600  ceFEPIme (MAXIPIME) 2 g in dextrose 5 % 50 mL IVPB     2 g 100 mL/hr over 30 Minutes Intravenous 3 times per day 03/29/14 0251     03/28/14 2315   piperacillin-tazobactam (ZOSYN) IVPB 3.375 g     3.375 g 100 mL/hr over 30 Minutes Intravenous  Once 03/28/14 2300 03/28/14 2356   03/28/14 2315  vancomycin (VANCOCIN) IVPB 1000 mg/200 mL premix     1,000 mg 200 mL/hr over 60 Minutes Intravenous  Once 03/28/14 2300 03/29/14 0045      DVT Prophylaxis:  SCD's  Code Status: Partial Code-No CPR or defibrillation. Only short term intubation  Family Communication Mother at bedside  Procedures:  None  CONSULTS:  hematology/oncology  Time spent 40 minutes-which includes 50% of the time with face-to-face with patient/ family and coordinating care related to the above assessment and plan.  MEDICATIONS: Scheduled Meds: . sodium chloride   Intravenous STAT  . sodium chloride   Intravenous Once  . acetaminophen  650 mg Oral Once  . ascorbic acid  300 mg Per Tube QHS  . calcium carbonate (dosed in mg elemental calcium)  1,250 mg of elemental calcium Per Tube TID WC  . ceFEPime (MAXIPIME) IV  2 g Intravenous 3 times per day  . diphenhydrAMINE  25 mg Intravenous Once  . folic acid  1 mg Oral Daily  . free water  172 mL Per Tube QID  . furosemide  20 mg Intravenous Once  . gabapentin  900 mg Per Tube 3 times per day  . lansoprazole  30 mg Per Tube BID  . levalbuterol  0.63 mg Nebulization Q6H  . levETIRAcetam  1,750 mg Per Tube BID  . multivitamin  10 mL Per Tube Daily  . sucralfate  1 g Per Tube 4 times per  day  . vancomycin  1,000 mg Intravenous Q8H  . zinc oxide  1 application Topical 5 X Daily   Continuous Infusions: . sodium chloride    . baclofen     PRN Meds:.acetaminophen **OR** acetaminophen, diazepam, dicyclomine, diphenoxylate-atropine, levalbuterol, midazolam, mupirocin ointment, ondansetron **OR** ondansetron (ZOFRAN) IV    PHYSICAL EXAM: Vital signs in last 24 hours: Filed Vitals:   03/29/14 0452 03/29/14 0610 03/29/14 0623 03/29/14 0731  BP:    118/43  Pulse:   124 123  Temp:    99.5 F (37.5 C)    TempSrc:    Axillary  Resp:   12 19  Height: 4' 11.5" (1.511 m)     Weight: 56.427 kg (124 lb 6.4 oz)     SpO2:  95% 100% 90%    Weight change:  Filed Weights   03/28/14 2219 03/29/14 0452  Weight: 58.514 kg (129 lb) 56.427 kg (124 lb 6.4 oz)   Body mass index is 24.71 kg/(m^2).   Gen ExamSleeping comfortably  Neck: Supple, No JVD.   Chest: B/L Clear.   CVS: S1 S2 Regular, no murmurs.  Abdomen: soft, BS +, non tender, non distended.  Extremities: no edema, lower extremities warm to touch.  Intake/Output from previous day:  Intake/Output Summary (Last 24 hours) at 03/29/14 0856 Last data filed at 03/29/14 0215  Gross per 24 hour  Intake   2250 ml  Output      0 ml  Net   2250 ml     LAB RESULTS: CBC  Recent Labs Lab 03/27/14 1445 03/28/14 2231 03/29/14 0735  WBC 1.6* 0.7* 0.6*  HGB 8.4* 7.7* 5.6*  HCT 26.5* 23.7* 17.7*  PLT 113* 102* 85*  MCV 97.1 95.6 96.7  MCH 30.8 31.0 30.6  MCHC 31.7 32.5 31.6  RDW 16.0* 16.4* 16.5*  LYMPHSABS 0.4* 0.2* PENDING  MONOABS 0.3 0.2 PENDING  EOSABS 0.0 0.0 PENDING  BASOSABS 0.0 0.0 PENDING    Chemistries   Recent Labs Lab 03/28/14 2231 03/29/14 0735  NA 141 141  K 3.8 3.3*  CL 104 105  CO2 24 27  GLUCOSE 110* 102*  BUN 16 12  CREATININE 0.62 0.58  CALCIUM 8.9 7.8*    CBG:  Recent Labs Lab 03/29/14 0749  GLUCAP 92    GFR Estimated Creatinine Clearance: 99.3 mL/min (by C-G formula based on Cr of 0.58).  Coagulation profile No results for input(s): INR, PROTIME in the last 168 hours.  Cardiac Enzymes No results for input(s): CKMB, TROPONINI, MYOGLOBIN in the last 168 hours.  Invalid input(s): CK  Invalid input(s): POCBNP No results for input(s): DDIMER in the last 72 hours. No results for input(s): HGBA1C in the last 72 hours. No results for input(s): CHOL, HDL, LDLCALC, TRIG, CHOLHDL, LDLDIRECT in the last 72 hours. No results for input(s): TSH, T4TOTAL, T3FREE, THYROIDAB in the last 72  hours.  Invalid input(s): FREET3 No results for input(s): VITAMINB12, FOLATE, FERRITIN, TIBC, IRON, RETICCTPCT in the last 72 hours. No results for input(s): LIPASE, AMYLASE in the last 72 hours.  Urine Studies No results for input(s): UHGB, CRYS in the last 72 hours.  Invalid input(s): UACOL, UAPR, USPG, UPH, UTP, UGL, UKET, UBIL, UNIT, UROB, ULEU, UEPI, UWBC, URBC, UBAC, CAST, UCOM, BILUA  MICROBIOLOGY: Recent Results (from the past 240 hour(s))  MRSA PCR Screening     Status: None   Collection Time: 03/29/14  6:19 AM  Result Value Ref Range Status   MRSA by PCR NEGATIVE NEGATIVE  Final    Comment:        The GeneXpert MRSA Assay (FDA approved for NASAL specimens only), is one component of a comprehensive MRSA colonization surveillance program. It is not intended to diagnose MRSA infection nor to guide or monitor treatment for MRSA infections.     RADIOLOGY STUDIES/RESULTS: Dg Chest Port 1 View  (if Code Sepsis Called)  03/28/2014   CLINICAL DATA:  Fever.  Code sepsis.  EXAM: PORTABLE CHEST - 1 VIEW  COMPARISON:  03/01/2014  FINDINGS: Shallow inspiration. Normal heart size and pulmonary vascularity. Power port type central venous catheter with tip over the cavoatrial junction. Linear atelectasis in the left lung base. No focal consolidation. No pneumothorax. No blunting of costophrenic angles. Postoperative changes with posterior rod fixation of the thoracic and visualized lumbar spine.  IMPRESSION: Shallow inspiration with linear atelectasis in the left lung base.   Electronically Signed   By: Lucienne Capers M.D.   On: 03/28/2014 23:07   Dg Chest Portable 1 View  03/01/2014   CLINICAL DATA:  Anemia. Chronic right lower lobe pneumonia. Neuro muscular disease.  EXAM: PORTABLE CHEST - 1 VIEW  COMPARISON:  11/06/2013  FINDINGS: Right jugular port appears unchanged in position. Spinal fixation hardware again noted.  There are low lung volumes. No airspace consolidation is evident.  No large effusions are evident.  IMPRESSION: No acute findings   Electronically Signed   By: Andreas Newport M.D.   On: 03/01/2014 20:16    Oren Binet, MD  [facility] Pager:[phone]  If 7PM-7AM, please contact night-coverage [email or web address] Password [facility] 03/29/2014, 8:56 AM   LOS: 0 days

## 2014-03-29 NOTE — Progress Notes (Signed)
INITIAL NUTRITION ASSESSMENT  DOCUMENTATION CODES Per approved criteria  -Not Applicable   INTERVENTION: Continue Two Cal HN as at home per [facility] at 46 ml/hr for 12 hours starting at 1400  Provide Prostat 30 ml BID  Provides 1304 kcal, 76 grams of protein, and 386 ml of H20  NUTRITION DIAGNOSIS: Increased nutrient needs related to chronic illness as evidenced by estimated energy requirements.   Goal: Pt to meet >/= 90% of estimated energy requirements  Monitor:  TF tolerance/adequacy, weight, labs  Reason for Assessment: consult for enteral/tube feeding initiation and management   24 y.o. male  Admitting Dx: SIRS (systemic inflammatory response syndrome)  ASSESSMENT: 24 y/o male with history of cerebral palsy, spastic quadriplegia, seizure disorder, and intraventricular hemorrhage. Pt was recently admitted for pancytopenia and planned to get bone marrow biopsy soon, was found to have fever. Pt found to be febrile and tachycardiac. Blood work showed pancytopenia with severe neutropenia.   Labs- low calcium, potassium Mother in room upon assessment and she reported no significant wt changes pta. Mother provided handout with information and instructions for tube feedings. She also brought Two Cal HN because she knew [facility] did not have this formula. Per mother, pt gained too much wt when TF rate was higher in the past. Will provide TF to continue as at home via [facility].  Talked with pharmacy about putting in TF order and management. Also discussed TF admission with nurse. Will order Prostat 30 ml BID to substitute ProMod.   Height: Ht Readings from Last 1 Encounters:  03/29/14 4' 11.5" (1.511 m)    Weight: Wt Readings from Last 1 Encounters:  03/29/14 124 lb 6.4 oz (56.427 kg)    Ideal Body Weight: 103 lb (46.8 kg)  % Ideal Body Weight: 120%  Wt Readings from Last 10 Encounters:  03/29/14 124 lb 6.4 oz (56.427 kg)  03/02/14 134 lb 0.6 oz (60.8 kg)  12/12/13 128 lb  (58.06 kg)  04/17/13 124 lb (56.246 kg)  12/25/12 116 lb (52.617 kg)  11/29/12 116 lb 2.9 oz (52.7 kg)  11/07/12 116 lb (52.617 kg)  10/30/12 116 lb (52.617 kg)  10/05/12 116 lb (52.617 kg)  09/22/12 105 lb 13.1 oz (48 kg)    Usual Body Weight: 124 lb  % Usual Body Weight: 100%  BMI:  Body mass index is 24.71 kg/(m^2). normal  Estimated Nutritional Needs: Kcal: 1440-1580 Protein: 65-80 grams Fluid: per MD  Skin: intact  Diet Order:  NPO  EDUCATION NEEDS: -No education needs identified at this time   Intake/Output Summary (Last 24 hours) at 03/29/14 0952 Last data filed at 03/29/14 0215  Gross per 24 hour  Intake   2250 ml  Output      0 ml  Net   2250 ml    Last BM: PTA  Labs:   Recent Labs Lab 03/28/14 2231 03/29/14 0735  NA 141 141  K 3.8 3.3*  CL 104 105  CO2 24 27  BUN 16 12  CREATININE 0.62 0.58  CALCIUM 8.9 7.8*  GLUCOSE 110* 102*    CBG (last 3)   Recent Labs  03/29/14 0749  GLUCAP 92    Scheduled Meds: . sodium chloride   Intravenous STAT  . sodium chloride   Intravenous Once  . acetaminophen  650 mg Oral Once  . ascorbic acid  300 mg Per Tube QHS  . calcium carbonate (dosed in mg elemental calcium)  1,250 mg of elemental calcium Per Tube TID WC  .  ceFEPime (MAXIPIME) IV  2 g Intravenous 3 times per day  . diphenhydrAMINE  25 mg Intravenous Once  . filgrastim  300 mcg Subcutaneous Daily  . folic acid  1 mg Oral Daily  . free water  172 mL Per Tube QID  . furosemide  20 mg Intravenous Once  . gabapentin  900 mg Per Tube 3 times per day  . lansoprazole  30 mg Per Tube BID  . levalbuterol  0.63 mg Nebulization Q6H  . levETIRAcetam  1,750 mg Per Tube BID  . multivitamin  10 mL Per Tube Daily  . sucralfate  1 g Per Tube 4 times per day  . vancomycin  1,000 mg Intravenous Q8H  . zinc oxide  1 application Topical 5 X Daily    Continuous Infusions: . sodium chloride    . baclofen      Past Medical History  Diagnosis Date  .  CP (cerebral palsy), spastic, quadriplegic   . Epilepsy   . Osteoporosis   . Inguinal hernia   . Undescended testes   . Seasonal allergies   . IVH (intraventricular hemorrhage)     Grade IV  . Hip dislocation, bilateral   . Seizures   . Hx: UTI (urinary tract infection)   . Dysphagia   . Otitis media   . Retinopathy of prematurity   . 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 Neuromuscular scoliosis   . Osteoporosis   . Complex partial seizures   . Generalized convulsive epilepsy without mention of intractable epilepsy   . Epilepsy   . Sinus bradycardia     HR drops to 38-40 while sleeping  . Sleep apnea     BiPAP  . Blister of right heel     fluid filled; origin unknown  . Kidney stones     ?  Marland Kitchen Pneumonia      chronic pneumonia ,respitory failure dx Augest 2014    Past Surgical History  Procedure Laterality Date  . Excision of moles    . Peg placement      With multiple changes  . Gastrostomy tube, change / reposition  12/15/2010       . Button change  12/15/2010    Procedure: BUTTON CHANGE;  Surgeon: Gatha Mayer, MD;  Location: [facility];  Service: Endoscopy;  Laterality: N/A;  . Peg placement  10/07/2011    Procedure: PERCUTANEOUS ENDOSCOPIC GASTROSTOMY (PEG) REPLACEMENT;  Surgeon: Lafayette Dragon, MD;  Location: [facility];  Service: Endoscopy;  Laterality: N/A;  Needs 18 F 2.5 button ordered-dl  . Inguinal hernia repair Bilateral 1992  . Retinopathy of prematurity surgery  1992  . Achilles tendon lengthening  12/1998  . Soft tissue releases  wrists and fingers  12/1998  . Intrathecal baclofen pump placement  07/25/2000  . Peg placement N/A 06/05/2012    Procedure: PERCUTANEOUS ENDOSCOPIC GASTROSTOMY (PEG) REPLACEMENT;  Surgeon: Lafayette Dragon, MD;  Location: [facility];  Service: Endoscopy;  Laterality: N/A;  button 54f.2.5cm  . Eye surgery Bilateral     Retinal   . Back surgery      Harrington Rods in back needs to be log rolled  . Tendon repair  Bilateral 09/05/2012    Procedure: LENGTHENING OF DIGITAL FLEXOR TENDONS BILTERAL HANDS;  Surgeon: DJolyn Nap MD;  [facility]  Service: Orthopedics;  Laterality: Bilateral;  . Hand surgery Bilateral Aug. 2014  . Peg placement N/A 09/13/2012    Procedure: PERCUTANEOUS ENDOSCOPIC GASTROSTOMY (PEG) REPLACEMENT;  Surgeon: Lafayette Dragon, MD;  Location: [facility];  Service: Endoscopy;  Laterality: N/A;  . Flexible sigmoidoscopy N/A 10/30/2012    Procedure: FLEXIBLE SIGMOIDOSCOPY;  Surgeon: Cleotis Nipper, MD;  Location: [facility];  Service: Endoscopy;  Laterality: N/A;  . Peg placement N/A 11/15/2012    Procedure: PERCUTANEOUS ENDOSCOPIC GASTROSTOMY (PEG) REPLACEMENT;  Surgeon: Jeryl Columbia, MD;  Location: [facility];  Service: Endoscopy;  Laterality: N/A;    Wynona Dove, MS Dietetic Intern Pager: [phone]  I agree with student dietitian note; appropriate revisions have been made.  Molli Barrows, RD, LDN, [place] Pager# [phone] After Hours Pager# [phone]

## 2014-03-29 NOTE — Progress Notes (Signed)
[facility]  Patient Status: Active (receiving services up to time of hospitalization)  [name] is providing the following services: RN  If patient discharges after hours, please call [phone].   Janae Sauce 03/29/2014, 10:09 AM

## 2014-03-29 NOTE — Progress Notes (Addendum)
Physician notified: [facility] Admission team At: 0808  Regarding: Physician for patient today? Critical lab results: Hbg 5.6 Hct 17.7 WBC 0.6 Awaiting return response.   Physician notified: Ghimire At: [email or web address]  Regarding: Critical results to report. Please call ASAP.  [phone]: in person on unit. CRITICAL VALUE ALERT  Critical value received:  Hgb 5.5, WBC 0.6  Date of notification:  03/29/14  Time of notification:  0806  Critical value read back:Yes.    Nurse who received alert:  Pricilla Holm, RN  MD notified (1st page):  Ghimire  Time of first page:  0808  MD notified (2nd page):  Time of second page:  Responding MD:  Sloan Leiter  Time MD responded:  [phone]

## 2014-03-29 NOTE — Consult Note (Signed)
[facility]  Telephone:([phone]) [place] NOTE  Jeffery [name]                                MR#: [id number]  DOB: [birth year]                       CSN#: [id number]  Referring MD: [facility]     Patient Care Team: Marijean Bravo, MD as PCP - General (Internal Medicine) Jolyn Nap, MD as Consulting Physician (Orthopedic Surgery)  Reason for Consult: Pancytopenia   NOB:SJGGEZ Jeffery [name] is a24 y.o.male with cerebral palsy with spastic quadriplegia among other critical medical issues listed below, admitted on 2/25 with SIRS, following a recent hospitalization from 03/01/14 through 03/04/14.  Patient carries a new diagnosis of pancytopenia and chronic thrombocytopenia, recently consulted on 1/16/216 by Dr. Whitney Muse on 1/30 at [facility]. At the time, DAT was negative, HIV was negative, LDH was normal, DIC panel was unremarkable. Retics were  0.9.  His WBC at the time was 0.9, plts 101 and Hb 4.7 requiring transfusions. His B12 and folate were normal. His Iron studies showed low iron sats with elevated ferritin. His smear was essentially unremarkable, with large platelets, no blasts and without schistocytes. Patient had a normal CBC as of 04/07/13 except for chronic mild thrombocytopenia. A bone marrow biopsy was recommended at the time but mother refused as she did not wish any invasive procedures to be performed. Sample was sent for flow cytometry. During this admission, he continues to be febrile. Cultures are pending. He is on empiric antibiotics.  His WBC is 0.6, Hb 5.6 anf platelets are 85,000. He is to receive Neupogen 300 mcg daily.  He was placed on neutropenic precautions. Medications include Keppra, Vimpat, gabapentin and Vancomycin. He was on Diflucan during his prior admission. We were asked to consult patient with recommendations.   PMH:  Past Medical History  Diagnosis Date  . CP (cerebral palsy), spastic, quadriplegic   .  Epilepsy   . Osteoporosis   . Inguinal hernia   . Undescended testes   . Seasonal allergies   . IVH (intraventricular hemorrhage)     Grade IV  . Hip dislocation, bilateral   . Seizures   . Hx: UTI (urinary tract infection)   . Dysphagia   . Otitis media   . Retinopathy of prematurity   . 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 Neuromuscular scoliosis   . Osteoporosis   . Complex partial seizures   . Generalized convulsive epilepsy without mention of intractable epilepsy   . Epilepsy   . Sinus bradycardia     HR drops to 38-40 while sleeping  . Sleep apnea     BiPAP  . Blister of right heel     fluid filled; origin unknown  . Kidney stones     ?  Marland Kitchen Pneumonia      chronic pneumonia ,respitory failure dx Augest 2014    Surgeries:  Past Surgical History  Procedure Laterality Date  . Excision of moles    . Peg placement      With multiple changes  . Gastrostomy tube, change / reposition  12/15/2010       . Button change  12/15/2010    Procedure: BUTTON CHANGE;  Surgeon: Gatha Mayer, MD;  Location: [facility];  Service: Endoscopy;  Laterality:  N/A;  . Peg placement  10/07/2011    Procedure: PERCUTANEOUS ENDOSCOPIC GASTROSTOMY (PEG) REPLACEMENT;  Surgeon: Lafayette Dragon, MD;  Location: [facility];  Service: Endoscopy;  Laterality: N/A;  Needs 18 F 2.5 button ordered-dl  . Inguinal hernia repair Bilateral 1992  . Retinopathy of prematurity surgery  1992  . Achilles tendon lengthening  12/1998  . Soft tissue releases  wrists and fingers  12/1998  . Intrathecal baclofen pump placement  07/25/2000  . Peg placement N/A 06/05/2012    Procedure: PERCUTANEOUS ENDOSCOPIC GASTROSTOMY (PEG) REPLACEMENT;  Surgeon: Lafayette Dragon, MD;  Location: [facility];  Service: Endoscopy;  Laterality: N/A;  button 29f.2.5cm  . Eye surgery Bilateral     Retinal   . Back surgery      Harrington Rods in back needs to be log rolled  . Tendon repair Bilateral 09/05/2012    Procedure:  LENGTHENING OF DIGITAL FLEXOR TENDONS BILTERAL HANDS;  Surgeon: DJolyn Nap MD;  [facility] [facility]  Service: Orthopedics;  Laterality: Bilateral;  . Hand surgery Bilateral Aug. 2014  . Peg placement N/A 09/13/2012    Procedure: PERCUTANEOUS ENDOSCOPIC GASTROSTOMY (PEG) REPLACEMENT;  Surgeon: DLafayette Dragon MD;  Location: [facility];  Service: Endoscopy;  Laterality: N/A;  . Flexible sigmoidoscopy N/A 10/30/2012    Procedure: FLEXIBLE SIGMOIDOSCOPY;  Surgeon: RCleotis Nipper MD;  Location: [facility];  Service: Endoscopy;  Laterality: N/A;  . Peg placement N/A 11/15/2012    Procedure: PERCUTANEOUS ENDOSCOPIC GASTROSTOMY (PEG) REPLACEMENT;  Surgeon: MJeryl Columbia MD;  [facility];  Service: Endoscopy;  Laterality: N/A;    Allergies:  Allergies  Allergen Reactions  . Adhesive [Tape]     Rips skin off  . Depakote [Divalproex Sodium]     Causes pancreatitis   . Amoxicillin-Pot Clavulanate Rash    Medications:   Prior to Admission:  Facility-administered medications prior to admission  Medication Dose Route Frequency Provider Last Rate Last Dose  . botulinum toxin Type A (BOTOX) injection 300 Units  300 Units Intramuscular Once YMarcial Pacas MD       Prescriptions prior to admission  Medication Sig Dispense Refill Last Dose  . albuterol (PROVENTIL HFA;VENTOLIN HFA) 108 (90 BASE) MCG/ACT inhaler Inhale 2 puffs into the lungs every 4 (four) hours as needed for shortness of breath.   03/28/2014 at Unknown time  . albuterol (PROVENTIL) (2.5 MG/3ML) 0.083% nebulizer solution Take 2.5 mg by nebulization. 1 ampule twice daily and as needed (making 4 doses daily)   03/28/2014 at Unknown time  . Ascorbic Acid (VITAMIN C) 500 MG/5ML LIQD 300 mg by PEG Tube route at bedtime.    03/28/2014 at Unknown time  . baclofen (GABLOFEN) 40000 MCG/20ML SOLN 385.2 mcg by Intrathecal route continuous.    03/28/2014 at Unknown time  . Botulinum Toxin Type A, Cosm, (BOTOX COSMETIC) 50 UNITS SOLR  Inject 300 Units into the muscle.   unknown  . calcium carbonate, dosed in mg elemental calcium, 1250 MG/5ML 1,250 mg of elemental calcium by PEG Tube route 3 (three) times daily. 5 cc via peg tube   03/28/2014 at Unknown time  . diazepam (DIASTAT ACUDIAL) 10 MG GEL Place 12.5 mg rectally as needed for seizure.    Past Week at Unknown time  . dicyclomine (BENTYL) 10 MG/5ML syrup Place into feeding tube every 8 (eight) hours as needed. Not to exceed 5 doses in 1 wk   03/28/2014 at Unknown time  . diphenoxylate-atropine (LOMOTIL) 2.5-0.025 MG/5ML liquid Place into  feeding tube 4 (four) times daily as needed for diarrhea or loose stools. 1-2 tsp via feeding tube q6h prn loose stools   Past Week at Unknown time  . folic acid (FOLVITE) 1 MG tablet Take 1 mg by mouth daily.   03/28/2014 at Unknown time  . furosemide (LASIX) 10 MG/ML solution Place 20 mg into feeding tube daily as needed for fluid.    03/28/2014 at Unknown time  . gabapentin (NEURONTIN) 250 MG/5ML solution Take 18 mL 3 times daily (Patient taking differently: Place 825 mg into feeding tube 3 (three) times daily. Take 18 mL 3 times daily) 1620 mL 5 03/28/2014 at Unknown time  . KEPPRA 100 MG/ML solution TAKE 17.5 ML'S BY MOUTH TWICE A DAY 1419 mL 5 03/28/2014 at Unknown time  . ketoconazole (NIZORAL) 2 % cream Apply 1 application topically 2 (two) times daily as needed for irritation.   03/28/2014 at Unknown time  . lansoprazole (PREVACID SOLUTAB) 30 MG disintegrating tablet Take 30 mg by mouth 2 (two) times daily. 730am   03/28/2014 at Unknown time  . midazolam (VERSED) 5 MG/ML injection Place 2 mLs (10 mg total) into the nose once. Draw up 31m in 2 syringes. Remove blue vial access device. Attach syringe to nasal atomizer for intranasal administration. Give 144min right nostril x 2 for seizures lasting 2 minutes or longer or for repetitive seizures in a short period of time. 6 mL 3 unknown  . Multiple Vitamin (MULTIVITAMIN) LIQD 10 mLs by PEG Tube  route daily.   03/28/2014 at Unknown time  . Multiple Vitamins-Minerals (ZINC PO) 5 mLs by PEG Tube route daily. 244 mg / 5 mL zinc solution   03/28/2014 at Unknown time  . mupirocin ointment (BACTROBAN) 2 % Apply 1 application topically as needed (to G-T site). 22 g 1 03/28/2014 at Unknown time  . Nutritional Supplements (PROMOD PO) Take 30 mLs by mouth 2 (two) times daily. Noon and 10pm   03/28/2014 at Unknown time  . Nutritional Supplements (TWOCAL HN) LIQD Take 237 mLs by mouth. T.3 can at 46 cc hour x 12 hours - water 172 cc pre and post each and extra 172 bid.   03/28/2014 at Unknown time  . OVER THE COUNTER MEDICATION Apply 1 application topically as needed (after diaper changes). Balmex and Bag Balm paste made and applied after diaper changes   03/28/2014 at Unknown time  . OXYGEN-HELIUM IN Inhale into the lungs. Oxygen PRN to keep O2 Sat at 90%   03/28/2014 at Unknown time  . Pseudoephedrine HCl (SUDAFED CHILDRENS) 15 MG/5ML LIQD Give 90 mg by tube every 6 (six) hours.    03/28/2014 at Unknown time  . simethicone (MYLICON) 12381G chewable tablet Chew 125 mg by mouth every 6 (six) hours as needed for flatulence.   03/28/2014 at Unknown time  . sodium phosphate (FLEET) enema Place 1 enema rectally. follow package directions PRN   unknown  . sucralfate (CARAFATE) 1 GM/10ML suspension Place 1 g into feeding tube 4 (four) times daily. 7am, 130pm, 5pm, 8pm   03/28/2014 at Unknown time  . Water For Irrigation, Sterile (FREE WATER) SOLN Place 172 mLs into feeding tube 4 (four) times daily.   03/28/2014 at Unknown time  . zinc oxide (BALMEX) 11.3 % CREA cream Apply 1 application topically 5 (five) times daily. With each diaper change.   03/28/2014 at Unknown time  . docusate (COLACE) 50 MG/5ML liquid Take 50 mg by mouth daily as needed for mild constipation.  Not Taking at Unknown time  . fluconazole (DIFLUCAN) 40 MG/ML suspension Take 2.5 mLs (100 mg total) by mouth daily. For 9 additional days (Patient not  taking: Reported on 03/29/2014) 35 mL 1 Completed Course at Unknown time  . miconazole (MICOTIN) 2 % cream Apply topically 2 (two) times daily. (Patient not taking: Reported on 03/29/2014) 28.35 g 1 Completed Course at Unknown time  . nystatin cream (MYCOSTATIN) Apply 0,923,300 application topically. 3-4 times daily   Not Taking at Unknown time  . polyethylene glycol (MIRALAX / GLYCOLAX) packet Take 17 g by mouth daily.   Not Taking at Unknown time  . VIMPAT 10 MG/ML SOLN Give 49m by mouth twice per day (Patient not taking: Reported on 03/28/2014) 340 mL 5 Completed Course at Unknown time    Scheduled Meds: . sodium chloride   Intravenous STAT  . sodium chloride   Intravenous Once  . acetaminophen  650 mg Oral Once  . ascorbic acid  300 mg Per Tube QHS  . calcium carbonate (dosed in mg elemental calcium)  1,250 mg of elemental calcium Per Tube TID WC  . ceFEPime (MAXIPIME) IV  2 g Intravenous 3 times per day  . filgrastim  300 mcg Subcutaneous Daily  . folic acid  1 mg Oral Daily  . free water  172 mL Per Tube QID  . furosemide  20 mg Intravenous Once  . gabapentin  900 mg Per Tube 3 times per day  . lansoprazole  30 mg Per Tube BID  . levalbuterol  0.63 mg Nebulization Q6H  . levETIRAcetam  1,750 mg Per Tube BID  . multivitamin  10 mL Per Tube Daily  . sucralfate  1 g Per Tube 4 times per day  . vancomycin  1,000 mg Intravenous Q8H  . zinc oxide  1 application Topical 5 X Daily   Continuous Infusions: . sodium chloride    . baclofen     PRN Meds:.acetaminophen **OR** acetaminophen, diazepam, dicyclomine, diphenoxylate-atropine, levalbuterol, midazolam, mupirocin ointment, ondansetron **OR** ondansetron (ZOFRAN) IV  ROS: Patient asleep did not disturb.    Family History:    Family History  Problem Relation Age of Onset  . Adopted: Yes    Social History:  reports that he has never smoked. He has never used smokeless tobacco. He reports that he does not drink alcohol or use  illicit drugs.  Physical Exam   ECOG PERFORMANCE STATUS:4  Filed Vitals:   03/29/14 1018  BP: 131/70  Pulse: 123  Temp: 99.4 F (37.4 C)  Resp: 20   Filed Weights   03/28/14 2219 03/29/14 0452  Weight: 129 lb (58.514 kg) 124 lb 6.4 oz (56.427 kg)    GENERAL Patient asleep, appears comfortable. Chronically ill appearing EYES: normal, conjunctiva are pink and non-injected, sclera clear OROPHARYNX:no exudate, no erythema and lips, buccal mucosa, and tongue normal  NECK: supple, thyroid normal size, non-tender, without nodularity LYMPH:  no palpable lymphadenopathy in the cervical, axillary or inguinal area LUNGS: clear to auscultation and percussion with normal breathing effort. Right port  HEART: regular rate & rhythm and no murmurs and no lower extremity edema ABDOMEN  soft, non-tender and normal bowel sounds. PEG tube present.  NEURO: spastic quadriplegia, seizure disorder  Labs:    Recent Labs Lab 03/27/14 1445 03/28/14 2231 03/29/14 0735  WBC 1.6* 0.7* 0.6*  HGB 8.4* 7.7* 5.6*  HCT 26.5* 23.7* 17.7*  PLT 113* 102* 85*  MCV 97.1 95.6 96.7  MCH 30.8 31.0 30.6  MCHC  31.7 32.5 31.6  RDW 16.0* 16.4* 16.5*  LYMPHSABS 0.4* 0.2* 0.2*  MONOABS 0.3 0.2 0.1  EOSABS 0.0 0.0 0.0  BASOSABS 0.0 0.0 0.0       Recent Labs Lab 03/28/14 2231 03/29/14 0735  NA 141 141  K 3.8 3.3*  CL 104 105  CO2 24 27  GLUCOSE 110* 102*  BUN 16 12  CREATININE 0.62 0.58  CALCIUM 8.9 7.8*  AST 23 19  ALT 39 29  ALKPHOS 136* 98  BILITOT 0.6 0.9        Component Value Date/Time   BILITOT 0.9 03/29/2014 0735      Recent Labs Lab 03/29/14 0900  INR 1.34    No results for input(s): DDIMER in the last 72 hours.   Anemia panel: Iron and TIBC Status: Abnormal   Collection Time: 03/01/14 10:48 PM  Result Value Ref Range   Iron 45 42 - 165 ug/dL   TIBC 285 215 - 435 ug/dL   Saturation Ratios 16 (L) 20 - 55 %   UIBC 240 125 - 400 ug/dL             Recent Labs  03/29/14 0900  RETICCTPCT 1.2    Urinalysis    Component Value Date/Time   COLORURINE YELLOW 03/29/2014 0024   APPEARANCEUR CLEAR 03/29/2014 0024   LABSPEC 1.030 03/29/2014 0024   PHURINE 5.5 03/29/2014 0024   GLUCOSEU NEGATIVE 03/29/2014 0024   HGBUR NEGATIVE 03/29/2014 0024   BILIRUBINUR NEGATIVE 03/29/2014 0024   KETONESUR NEGATIVE 03/29/2014 0024   PROTEINUR 100* 03/29/2014 0024   UROBILINOGEN 0.2 03/29/2014 0024   NITRITE NEGATIVE 03/29/2014 0024   LEUKOCYTESUR NEGATIVE 03/29/2014 0024    Drugs of Abuse  No results found for: LABOPIA, COCAINSCRNUR, LABBENZ, AMPHETMU, THCU, LABBARB    Imaging Studies:  Dg Chest Port 1 View  (if Code Sepsis Called)  03/28/2014   CLINICAL DATA:  Fever.  Code sepsis.  EXAM: PORTABLE CHEST - 1 VIEW  COMPARISON:  03/01/2014  FINDINGS: Shallow inspiration. Normal heart size and pulmonary vascularity. Power port type central venous catheter with tip over the cavoatrial junction. Linear atelectasis in the left lung base. No focal consolidation. No pneumothorax. No blunting of costophrenic angles. Postoperative changes with posterior rod fixation of the thoracic and visualized lumbar spine.  IMPRESSION: Shallow inspiration with linear atelectasis in the left lung base.   Electronically Signed   By: Lucienne Capers M.D.   On: 03/28/2014 23:07   Dg Chest Portable 1 View  03/01/2014   CLINICAL DATA:  Anemia. Chronic right lower lobe pneumonia. Neuro muscular disease.  EXAM: PORTABLE CHEST - 1 VIEW  COMPARISON:  11/06/2013  FINDINGS: Right jugular port appears unchanged in position. Spinal fixation hardware again noted.  There are low lung volumes. No airspace consolidation is evident. No large effusions are evident.  IMPRESSION: No acute findings   Electronically Signed   By: Andreas Newport M.D.   On: 03/01/2014 20:16    Assessment and Plan 24 y.o.   Pancytopenia A recent evaluation demonstrated, with the exception of large  platelets, essentially unremarkable smear.  A bone marrow biopsy had been recommended at the time but mother refused as she did not wish any invasive procedures to be performed. Sample was sent for flow cytometry, which results are not available for review.Marland Kitchen  He is now presenting with SIRS, requiring antibiotics, IV fluids His CBC is abnormal, with anemia requiring transfusion, thrombocytopenia without bleeding issues, and neutropenia Will order a new  smear.  Bone marrow biopsy may need to be further discussed with patient's family.  Continue neutropenic precautions Neupogen 300 mcg daily has been added.  Continue to transfuse blood as needed to maintain Hb 8 g Transfuse 1 unit of platelets if count is less or equal than 10,000 or 20,000 if the patient is acutely bleeding Dr. Marin Olp to see patient later today with recommendations  Partial Code  Other medical issues as per admitting team    **Disclaimer: This note was dictated with voice recognition software. Similar sounding words can inadvertently be transcribed and this note may contain transcription errors which may not have been corrected upon publication of note.**  WERTMAN,SARA E, PA-C 03/29/2014 10:42 AM  ADDENDUM:  I saw and examined the patient.  i looked at his blood smear.  This was very microcytic.  No nucleated red cells noted. Some atypical lymphocytes.  No blasts.  He had decreased platelets but well granulated.  Several platelets were large.  There is no splenomegaly by exam.  I would think that the pancytopenia is medicine related.  The big "?" is which medicine?  i agree with stopping the Keppra.  He has been on this for many years.  The Vimpat was stopped.  His WBC is already increasing with 1 dose of Neupogen. I think that trying to narrow the antibiotics would be helpful.  Cultures so far are negative.  I think that the only abx needed is Maxapime.   I spoke with his Mom at length.  She wants to get him home ovr the  weekend.  I totally agree with holding on a bone marrow bx for now.  I have to believe this is medication related.  He is iron deficient.  One remote possibility is PNH.  This could be diagnosed by flow cytometry.  This is a vry difficult situation given his underlying health issues.  [name]

## 2014-03-29 NOTE — Progress Notes (Signed)
Pt arrived to the unit via strecher. Pt transferred to an air loss bed. Mother is at bed side and has multiple concerns about pt care. Pt is placed on monitor and assessed. The Air loss bed scale is not functioning. The weight is obtained from mother per last doctors appointment. MD made aware of elevated HR 120-130's. Mother has a medication list with times and states 'that this list should tried to be follow if possible, in order to decrease chance of aspiration." This list is at pt bed side and pharmacy has been notified. Will continue to monitor pt.

## 2014-03-29 NOTE — ED Notes (Signed)
Rapid Response to see patient before patient goes upstairs.

## 2014-03-29 NOTE — Progress Notes (Signed)
Utilization review completed.  

## 2014-03-29 NOTE — Progress Notes (Addendum)
ANTIBIOTIC CONSULT NOTE - INITIAL  Pharmacy Consult for Vancocin and Maxipime Indication: rule out sepsis  Allergies  Allergen Reactions  . Adhesive [Tape]     Rips skin off  . Depakote [Divalproex Sodium]     Causes pancreatitis   . Amoxicillin-Pot Clavulanate Rash    Patient Measurements: Height: 4\' 11"  (149.9 cm) Weight: 129 lb (58.514 kg) IBW/kg (Calculated) : 47.7  Vital Signs: Temp: 103.3 F (39.6 C) (02/25 2235) Temp Source: Rectal (02/25 2235) BP: 108/57 mmHg (02/26 0000) Pulse Rate: 138 (02/26 0000)  Labs:  Recent Labs  03/27/14 1445 03/28/14 2231  WBC 1.6* 0.7*  HGB 8.4* 7.7*  PLT 113* 102*  CREATININE  --  0.62   Estimated Creatinine Clearance: 105.6 mL/min (by C-G formula based on Cr of 0.62).   Microbiology: Recent Results (from the past 720 hour(s))  Blood culture (routine x 2)     Status: None   Collection Time: 03/01/14  8:20 PM  Result Value Ref Range Status   Specimen Description BLOOD RIGHT PORTA CART  Final   Special Requests BOTTLES DRAWN AEROBIC AND ANAEROBIC [facility] EACH  Final   Culture   Final    NO GROWTH 5 DAYS Performed at [facility]    Report Status 03/07/2014 FINAL  Final  MRSA PCR Screening     Status: None   Collection Time: 03/02/14  1:53 AM  Result Value Ref Range Status   MRSA by PCR NEGATIVE NEGATIVE Final    Comment:        The GeneXpert MRSA Assay (FDA approved for NASAL specimens only), is one component of a comprehensive MRSA colonization surveillance program. It is not intended to diagnose MRSA infection nor to guide or monitor treatment for MRSA infections.   Clostridium Difficile by PCR     Status: None   Collection Time: 03/02/14  2:47 PM  Result Value Ref Range Status   C difficile by pcr NEGATIVE NEGATIVE Final    Medical History: Past Medical History  Diagnosis Date  . CP (cerebral palsy), spastic, quadriplegic   . Epilepsy   . Osteoporosis   . Inguinal hernia   . Undescended testes    . Seasonal allergies   . IVH (intraventricular hemorrhage)     Grade IV  . Hip dislocation, bilateral   . Seizures   . Hx: UTI (urinary tract infection)   . Dysphagia   . Otitis media   . Retinopathy of prematurity   . 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 Neuromuscular scoliosis   . Osteoporosis   . Complex partial seizures   . Generalized convulsive epilepsy without mention of intractable epilepsy   . Epilepsy   . Sinus bradycardia     HR drops to 38-40 while sleeping  . Sleep apnea     BiPAP  . Blister of right heel     fluid filled; origin unknown  . Kidney stones     ?  Marland Kitchen Pneumonia      chronic pneumonia ,respitory failure dx Augest 2014     Assessment: 24yo male w/ recent admission for pancytopenia now presents from home w/ concern for sepsis by parent 2/2 fever starting at 1900, meets SIRS criteria, to begin IV ABX.  Goal of Therapy:  Vancomycin trough level 15-20 mcg/ml  Plan:  Will start vancomycin 1000mg  IV Q8H (given recent admission with low trough at 750mg  Q8H) and Zosyn 3.375g IV Q8H and monitor CBC, Cx, levels prn.  Wynona Neat, PharmD,  BCPS  03/29/2014,12:33 AM   ADDENDUM: Admitting MD is changing from Zosyn to cefepime.  Will start cefepime 2g IV Q8H and monitor CBC and Cx.   VB    03/29/2014 2:51 AM

## 2014-03-30 DIAGNOSIS — E611 Iron deficiency: Secondary | ICD-10-CM

## 2014-03-30 LAB — CBC WITH DIFFERENTIAL/PLATELET
BASOS ABS: 0 10*3/uL (ref 0.0–0.1)
BASOS PCT: 0 % (ref 0–1)
Basophils Absolute: 0 K/uL (ref 0.0–0.1)
Basophils Absolute: 0 10*3/uL (ref 0.0–0.1)
Basophils Relative: 0 % (ref 0–1)
Basophils Relative: 0 % (ref 0–1)
Eosinophils Absolute: 0 K/uL (ref 0.0–0.7)
Eosinophils Absolute: 0.1 10*3/uL (ref 0.0–0.7)
Eosinophils Absolute: 0 10*3/uL (ref 0.0–0.7)
Eosinophils Relative: 0 % (ref 0–5)
Eosinophils Relative: 1 % (ref 0–5)
Eosinophils Relative: 0 % (ref 0–5)
HCT: 24.5 % — ABNORMAL LOW (ref 39.0–52.0)
HCT: 33.6 % — ABNORMAL LOW (ref 39.0–52.0)
HEMATOCRIT: 23.9 % — AB (ref 39.0–52.0)
Hemoglobin: 8.1 g/dL — ABNORMAL LOW (ref 13.0–17.0)
Hemoglobin: 7.5 g/dL — ABNORMAL LOW (ref 13.0–17.0)
Hemoglobin: 11.1 g/dL — ABNORMAL LOW (ref 13.0–17.0)
Lymphocytes Relative: 14 % (ref 12–46)
Lymphocytes Relative: 7 % — ABNORMAL LOW (ref 12–46)
Lymphocytes Relative: 17 % (ref 12–46)
Lymphs Abs: 0.3 K/uL — ABNORMAL LOW (ref 0.7–4.0)
Lymphs Abs: 0.5 10*3/uL — ABNORMAL LOW (ref 0.7–4.0)
Lymphs Abs: 0.7 10*3/uL (ref 0.7–4.0)
MCH: 29.5 pg (ref 26.0–34.0)
MCH: 29.3 pg (ref 26.0–34.0)
MCH: 29 pg (ref 26.0–34.0)
MCHC: 33.1 g/dL (ref 30.0–36.0)
MCHC: 31.4 g/dL (ref 30.0–36.0)
MCHC: 33 g/dL (ref 30.0–36.0)
MCV: 89.1 fL (ref 78.0–100.0)
MCV: 93.4 fL (ref 78.0–100.0)
MCV: 87.7 fL (ref 78.0–100.0)
MONO ABS: 0.5 10*3/uL (ref 0.1–1.0)
MONO ABS: 0.8 10*3/uL (ref 0.1–1.0)
Monocytes Absolute: 0.3 K/uL (ref 0.1–1.0)
Monocytes Relative: 14 % — ABNORMAL HIGH (ref 3–12)
Monocytes Relative: 7 % (ref 3–12)
Monocytes Relative: 20 % — ABNORMAL HIGH (ref 3–12)
NEUTROS ABS: 2.5 10*3/uL (ref 1.7–7.7)
Neutro Abs: 1.6 K/uL — ABNORMAL LOW (ref 1.7–7.7)
Neutro Abs: 5.8 10*3/uL (ref 1.7–7.7)
Neutrophils Relative %: 72 % (ref 43–77)
Neutrophils Relative %: 85 % — ABNORMAL HIGH (ref 43–77)
Neutrophils Relative %: 63 % (ref 43–77)
PLATELETS: 113 10*3/uL — AB (ref 150–400)
PLATELETS: 75 10*3/uL — AB (ref 150–400)
Platelets: 83 K/uL — ABNORMAL LOW (ref 150–400)
RBC: 2.75 MIL/uL — ABNORMAL LOW (ref 4.22–5.81)
RBC: 2.56 MIL/uL — ABNORMAL LOW (ref 4.22–5.81)
RBC: 3.83 MIL/uL — ABNORMAL LOW (ref 4.22–5.81)
RDW: 17 % — ABNORMAL HIGH (ref 11.5–15.5)
RDW: 18.1 % — ABNORMAL HIGH (ref 11.5–15.5)
RDW: 16.1 % — ABNORMAL HIGH (ref 11.5–15.5)
WBC: 2.2 K/uL — ABNORMAL LOW (ref 4.0–10.5)
WBC: 6.8 10*3/uL (ref 4.0–10.5)
WBC: 4 10*3/uL (ref 4.0–10.5)

## 2014-03-30 LAB — URINE CULTURE
COLONY COUNT: NO GROWTH
CULTURE: NO GROWTH

## 2014-03-30 LAB — GLUCOSE, CAPILLARY
GLUCOSE-CAPILLARY: 77 mg/dL (ref 70–99)
GLUCOSE-CAPILLARY: 105 mg/dL — AB (ref 70–99)
Glucose-Capillary: 101 mg/dL — ABNORMAL HIGH (ref 70–99)
Glucose-Capillary: 78 mg/dL (ref 70–99)
Glucose-Capillary: 85 mg/dL (ref 70–99)
Glucose-Capillary: 91 mg/dL (ref 70–99)
Glucose-Capillary: 92 mg/dL (ref 70–99)

## 2014-03-30 LAB — BASIC METABOLIC PANEL
Anion gap: 7 (ref 5–15)
Anion gap: 8 (ref 5–15)
BUN: 8 mg/dL (ref 6–23)
BUN: 10 mg/dL (ref 6–23)
CHLORIDE: 104 mmol/L (ref 96–112)
CO2: 32 mmol/L (ref 19–32)
CO2: 29 mmol/L (ref 19–32)
CREATININE: 0.61 mg/dL (ref 0.50–1.35)
Calcium: 8.9 mg/dL (ref 8.4–10.5)
Calcium: 8.8 mg/dL (ref 8.4–10.5)
Chloride: 107 mmol/L (ref 96–112)
Creatinine, Ser: 0.56 mg/dL (ref 0.50–1.35)
GFR calc Af Amer: >90 mL/min (ref >90)
GFR calc non Af Amer: >90 mL/min (ref >90)
GFR calc non Af Amer: >90 mL/min (ref >90)
Glucose, Bld: 84 mg/dL (ref 70–99)
Glucose, Bld: 75 mg/dL (ref 70–99)
POTASSIUM: 3.1 mmol/L — AB (ref 3.5–5.1)
Potassium: 3 mmol/L — ABNORMAL LOW (ref 3.5–5.1)
Sodium: 143 mmol/L (ref 135–145)
Sodium: 144 mmol/L (ref 135–145)

## 2014-03-30 LAB — BASIC METABOLIC PANEL WITH GFR
Anion gap: 3 — ABNORMAL LOW (ref 5–15)
BUN: 40 mg/dL — ABNORMAL HIGH (ref 6–23)
CO2: 24 mmol/L (ref 19–32)
Calcium: 7.7 mg/dL — ABNORMAL LOW (ref 8.4–10.5)
Chloride: 102 mmol/L (ref 96–112)
Creatinine, Ser: 1.77 mg/dL — ABNORMAL HIGH (ref 0.50–1.35)
GFR calc Af Amer: 61 mL/min — ABNORMAL LOW (ref >90)
GFR calc non Af Amer: 53 mL/min — ABNORMAL LOW (ref >90)
Glucose, Bld: 112 mg/dL — ABNORMAL HIGH (ref 70–99)
Potassium: 4.7 mmol/L (ref 3.5–5.1)
Sodium: 129 mmol/L — ABNORMAL LOW (ref 135–145)

## 2014-03-30 LAB — HAPTOGLOBIN: HAPTOGLOBIN: 213 mg/dL — AB (ref 34–200)

## 2014-03-30 LAB — PREPARE RBC (CROSSMATCH)

## 2014-03-30 MED ORDER — SODIUM CHLORIDE 0.9 % IV SOLN: Freq: Once | INTRAVENOUS | Status: DC

## 2014-03-30 MED ORDER — CLOBAZAM 2.5 MG/ML PO SUSP
5.0000 mg | Freq: Two times a day (BID) | ORAL | Status: DC
  Administered 2014-03-30 – 2014-04-01 (×4): 5 mg
  Filled 2014-03-30 (×4): qty 4

## 2014-03-30 MED ORDER — FUROSEMIDE 10 MG/ML IJ SOLN
20.0000 mg | Freq: Once | INTRAMUSCULAR | Status: AC
  Administered 2014-03-31: 20 mg via INTRAVENOUS
  Filled 2014-03-30: qty 2

## 2014-03-30 MED ORDER — FUROSEMIDE 10 MG/ML IJ SOLN
20.0000 mg | Freq: Once | INTRAMUSCULAR | Status: AC
  Administered 2014-03-30: 20 mg via INTRAVENOUS
  Filled 2014-03-30: qty 2

## 2014-03-30 MED ORDER — CEFEPIME HCL 2 G IJ SOLR
2.0000 g | Freq: Two times a day (BID) | INTRAMUSCULAR | Status: DC
  Administered 2014-03-30 – 2014-03-31 (×2): 2 g via INTRAVENOUS
  Filled 2014-03-30 (×3): qty 2

## 2014-03-30 MED ORDER — POTASSIUM CHLORIDE 10 MEQ/100ML IV SOLN
10.0000 meq | INTRAVENOUS | Status: AC
  Administered 2014-03-30 – 2014-03-31 (×2): 10 meq via INTRAVENOUS
  Filled 2014-03-30: qty 100

## 2014-03-30 NOTE — Progress Notes (Addendum)
PATIENT DETAILS Name: Jeffery [name] Age: 24 y.o. Sex: male Date of Birth: 05/10/1990 Admit Date: 03/28/2014 Admitting Physician Rise Patience, MD EHU:[id number], DAVID C, MD  Subjective: Mother at bedside.  Assessment/Plan: Principal Problem:   SIRS (systemic inflammatory response syndrome):secondary to neutropenic fever. Continue IVF and IV Abx narrowed to Cefepime. Blood culture neg so far.  Active Problems:   Neutropenic Fever: foci of infection not apparent. Blood cultures neg. Continue empiric IV Cefepime. Appreciate Hematology input, started on Neupogen. Current thinking that etiology is likely medications-Keppra.Thankfully, WBC now improving and patient now afebrile.   Pancytopenia:Etiology felt to be related to medications-Keppra. Received 2 units of PRBC on 2/26 and 2 more on 2/27. WBC better with Neupogen. Follow CBC   ARF: ?Dehydration-mother refused initiation of peg feeds on 2/26, she does not want to restart today as well. 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    Seizure disorder: Vimpat was discontinued prior to admission, Keppra now discontinued as well. Prn Diazepam      Addendum:4:15 pm-spoke with Dr Anna Genre call for Dr Amedeo Kinsman discussed-now not on any anti-epileptics-Dr Jordan Hawks suggested that we could try Onfi 5 mg BID for 5 days, and then titrate upto 10 mg BID. Still ok to use prn Valium per Dr Jordan Hawks.Spoke with patient's mother-she was agreeable with this medication. Will follow.   Disposition: Remain inpatient-in SDU  Antibiotics: See belwo  Anti-infectives    Start     Dose/Rate Route Frequency Ordered Stop   03/30/14 1800  ceFEPIme (MAXIPIME) 2 g in dextrose 5 % 50 mL IVPB     2 g 100 mL/hr over 30 Minutes Intravenous Every 12 hours 03/30/14 1354     03/29/14 1500  fluconazole (DIFLUCAN) 40 MG/ML  suspension 100 mg     100 mg Oral Daily 03/29/14 1345     03/29/14 1400  ceFEPIme (MAXIPIME) 2 g in dextrose 5 % 50 mL IVPB  Status:  Discontinued     2 g 100 mL/hr over 30 Minutes Intravenous Every 8 hours 03/29/14 1112 03/30/14 1354   03/29/14 1300  vancomycin (VANCOCIN) IVPB 1000 mg/200 mL premix  Status:  Discontinued     1,000 mg 200 mL/hr over 60 Minutes Intravenous Every 8 hours 03/29/14 1112 03/29/14 2204   03/29/14 0800  vancomycin (VANCOCIN) IVPB 1000 mg/200 mL premix  Status:  Discontinued     1,000 mg 200 mL/hr over 60 Minutes Intravenous Every 8 hours 03/29/14 0037 03/29/14 1112   03/29/14 0600  piperacillin-tazobactam (ZOSYN) IVPB 3.375 g  Status:  Discontinued     3.375 g 12.5 mL/hr over 240 Minutes Intravenous Every 8 hours 03/29/14 0037 03/29/14 0249   03/29/14 0600  ceFEPIme (MAXIPIME) 2 g in dextrose 5 % 50 mL IVPB  Status:  Discontinued     2 g 100 mL/hr over 30 Minutes Intravenous 3 times per day 03/29/14 0251 03/29/14 1112   03/28/14 2315  piperacillin-tazobactam (ZOSYN) IVPB 3.375 g     3.375 g 100 mL/hr over 30 Minutes Intravenous  Once 03/28/14 2300 03/28/14 2356   03/28/14 2315  vancomycin (VANCOCIN) IVPB 1000 mg/200 mL premix     1,000 mg 200 mL/hr over 60 Minutes Intravenous  Once 03/28/14 2300 03/29/14 0045      DVT Prophylaxis:  SCD's  Code Status: Partial Code-No CPR or defibrillation. Only short term intubation  Family  Communication Mother at bedside  Procedures:  None  CONSULTS:  hematology/oncology  Time spent 40 minutes-which includes 50% of the time with face-to-face with patient/ family and coordinating care related to the above assessment and plan.  MEDICATIONS: Scheduled Meds: . acetaminophen  650 mg Oral Once  . antiseptic oral rinse  7 mL Mouth Rinse q12n4p  . ascorbic acid  300 mg Per Tube QHS  . calcium carbonate (dosed in mg elemental calcium)  1,250 mg of elemental calcium Per Tube TID WC  . ceFEPime (MAXIPIME) IV  2 g  Intravenous Q12H  . chlorhexidine  15 mL Mouth Rinse BID  . feeding supplement (PRO-STAT SUGAR FREE 64)  30 mL Per Tube BID  . [START ON 04/02/2014] ferumoxytol  510 mg Intravenous Once  . filgrastim  480 mcg Subcutaneous Daily  . fluconazole  100 mg Oral Daily  . folic acid  1 mg Oral Daily  . free water  172 mL Per Tube QID  . gabapentin  900 mg Per Tube 3 times per day  . lansoprazole  30 mg Per Tube BID  . levalbuterol  0.63 mg Nebulization Q6H  . multivitamin  10 mL Per Tube Daily  . simethicone  120 mg Oral TID  . sucralfate  1 g Per Tube 4 times per day  . Two Cal HN tube feeding  560 mL Per Tube Q24H  . zinc oxide  1 application Topical 5 X Daily   Continuous Infusions: . baclofen    . dextrose 5 % and 0.9% NaCl 100 mL/hr at 03/30/14 0903   PRN Meds:.acetaminophen (TYLENOL) oral liquid 160 mg/5 mL, acetaminophen **OR** acetaminophen, diazepam, dicyclomine, diphenoxylate-atropine, levalbuterol, mupirocin ointment, ondansetron **OR** ondansetron (ZOFRAN) IV    PHYSICAL EXAM: Vital signs in last 24 hours: Filed Vitals:   03/30/14 0935 03/30/14 1002 03/30/14 1236 03/30/14 1306  BP:  130/63    Pulse: 86 82 64   Temp: 97.8 F (36.6 C)  97.9 F (36.6 C) 99.5 F (37.5 C)  TempSrc: Axillary  Axillary Axillary  Resp: 16 15 17    Height:      Weight:      SpO2: 98% 100% 97% 96%    Weight change:  Filed Weights   03/28/14 2219 03/29/14 0452  Weight: 58.514 kg (129 lb) 56.427 kg (124 lb 6.4 oz)   Body mass index is 24.71 kg/(m^2).   Gen Exam:Sleeping comfortably. Not in any distress  Neck: Supple, No JVD.   Chest: B/L Clear.   CVS: S1 S2 Regular, no murmurs.  Abdomen: soft, BS +, non tender, non distended.  Extremities: no edema, lower extremities warm to touch.  Intake/Output from previous day:  Intake/Output Summary (Last 24 hours) at 03/30/14 1411 Last data filed at 03/30/14 1236  Gross per 24 hour  Intake 1916.75 ml  Output      0 ml  Net 1916.75 ml      LAB RESULTS: CBC  Recent Labs Lab 03/27/14 1445 03/28/14 2231 03/29/14 0735 03/29/14 2100 03/30/14 0520  WBC 1.6* 0.7* 0.6* 2.2* 6.8  HGB 8.4* 7.7* 5.6* 8.1* 7.5*  HCT 26.5* 23.7* 17.7* 24.5* 23.9*  PLT 113* 102* 85* 83* 113*  MCV 97.1 95.6 96.7 89.1 93.4  MCH 30.8 31.0 30.6 29.5 29.3  MCHC 31.7 32.5 31.6 33.1 31.4  RDW 16.0* 16.4* 16.5* 17.0* 18.1*  LYMPHSABS 0.4* 0.2* 0.2* 0.3* 0.5*  MONOABS 0.3 0.2 0.1 0.3 0.5  EOSABS 0.0 0.0 0.0 0.0 0.1  BASOSABS 0.0 0.0 0.0  0.0 0.0    Chemistries   Recent Labs Lab 03/28/14 2231 03/29/14 0735 03/30/14 0520  NA 141 141 129*  K 3.8 3.3* 4.7  CL 104 105 102  CO2 24 27 24   GLUCOSE 110* 102* 112*  BUN 16 12 40*  CREATININE 0.62 0.58 1.77*  CALCIUM 8.9 7.8* 7.7*    CBG:  Recent Labs Lab 03/29/14 1225 03/29/14 1517 03/29/14 1933 03/29/14 2351 03/30/14 0311  GLUCAP 87 94 82 91 92    GFR Estimated Creatinine Clearance: 44.9 mL/min (by C-G formula based on Cr of 1.77).  Coagulation profile  Recent Labs Lab 03/29/14 0900  INR 1.34    Cardiac Enzymes No results for input(s): CKMB, TROPONINI, MYOGLOBIN in the last 168 hours.  Invalid input(s): CK  Invalid input(s): POCBNP No results for input(s): DDIMER in the last 72 hours. No results for input(s): HGBA1C in the last 72 hours. No results for input(s): CHOL, HDL, LDLCALC, TRIG, CHOLHDL, LDLDIRECT in the last 72 hours. No results for input(s): TSH, T4TOTAL, T3FREE, THYROIDAB in the last 72 hours.  Invalid input(s): FREET3  Recent Labs  03/29/14 0900  VITAMINB12 539  FOLATE >20.0  FERRITIN 577*  TIBC Not calculated due to Iron <10.  IRON <10*  RETICCTPCT 1.2   No results for input(s): LIPASE, AMYLASE in the last 72 hours.  Urine Studies No results for input(s): UHGB, CRYS in the last 72 hours.  Invalid input(s): UACOL, UAPR, USPG, UPH, UTP, UGL, UKET, UBIL, UNIT, UROB, ULEU, UEPI, UWBC, URBC, UBAC, CAST, UCOM, BILUA  MICROBIOLOGY: Recent  Results (from the past 240 hour(s))  Blood culture (routine x 2)     Status: None (Preliminary result)   Collection Time: 03/28/14 10:31 PM  Result Value Ref Range Status   Specimen Description BLOOD LEFT HAND  Final   Special Requests BOTTLES DRAWN AEROBIC AND ANAEROBIC 5CC  Final   Culture   Final           BLOOD CULTURE RECEIVED NO GROWTH TO DATE CULTURE WILL BE HELD FOR 5 DAYS BEFORE ISSUING A FINAL NEGATIVE REPORT Performed at [facility]    Report Status PENDING  Incomplete  Blood culture (routine x 2)     Status: None (Preliminary result)   Collection Time: 03/28/14 10:56 PM  Result Value Ref Range Status   Specimen Description BLOOD RIGHT PORTA CATH  Final   Special Requests BOTTLES DRAWN AEROBIC AND ANAEROBIC 5CC  Final   Culture   Final           BLOOD CULTURE RECEIVED NO GROWTH TO DATE CULTURE WILL BE HELD FOR 5 DAYS BEFORE ISSUING A FINAL NEGATIVE REPORT Performed at [facility]    Report Status PENDING  Incomplete  Urine culture     Status: None   Collection Time: 03/29/14 12:24 AM  Result Value Ref Range Status   Specimen Description URINE, CATHETERIZED  Final   Special Requests NONE  Final   Colony Count NO GROWTH Performed at [facility]   Final   Culture NO GROWTH Performed at [facility]   Final   Report Status 03/30/2014 FINAL  Final  MRSA PCR Screening     Status: None   Collection Time: 03/29/14  6:19 AM  Result Value Ref Range Status   MRSA by PCR NEGATIVE NEGATIVE Final    Comment:        The GeneXpert MRSA Assay (FDA approved for NASAL specimens only), is one component of a  comprehensive MRSA colonization surveillance program. It is not intended to diagnose MRSA infection nor to guide or monitor treatment for MRSA infections.     RADIOLOGY STUDIES/RESULTS: Dg Chest Port 1 View  (if Code Sepsis Called)  03/28/2014   CLINICAL DATA:  Fever.  Code sepsis.  EXAM: PORTABLE CHEST - 1 VIEW  COMPARISON:  03/01/2014   FINDINGS: Shallow inspiration. Normal heart size and pulmonary vascularity. Power port type central venous catheter with tip over the cavoatrial junction. Linear atelectasis in the left lung base. No focal consolidation. No pneumothorax. No blunting of costophrenic angles. Postoperative changes with posterior rod fixation of the thoracic and visualized lumbar spine.  IMPRESSION: Shallow inspiration with linear atelectasis in the left lung base.   Electronically Signed   By: Lucienne Capers M.D.   On: 03/28/2014 23:07   Dg Chest Portable 1 View  03/01/2014   CLINICAL DATA:  Anemia. Chronic right lower lobe pneumonia. Neuro muscular disease.  EXAM: PORTABLE CHEST - 1 VIEW  COMPARISON:  11/06/2013  FINDINGS: Right jugular port appears unchanged in position. Spinal fixation hardware again noted.  There are low lung volumes. No airspace consolidation is evident. No large effusions are evident.  IMPRESSION: No acute findings   Electronically Signed   By: Andreas Newport M.D.   On: 03/01/2014 20:16    Oren Binet, MD  [facility] Pager:[phone]  If 7PM-7AM, please contact night-coverage [email or web address] Password [email or web address] 03/30/2014, 2:11 PM   LOS: 1 day

## 2014-03-30 NOTE — Progress Notes (Signed)
Events of last night noted. Looks like he had a seizure. His mom says that this is really not anything unusual.  This morning, he looks pretty good. He is trying to lick a lollipop.  His white blood cell count came up really nicely. He got 1 dose of Neupogen. To me, this indicates a suppressive effect on the bone marrow and not any type of marrow infiltrative process.  His platelet count also has come up a little bit. His hemoglobin has gone down. He is not bleeding. I suspect that he probably has a very low erythropoietin level. It is possible that he may need Aranesp or Procrit. I'll check an erythropoietin level.  So far, his cultures are negative. I think that just Maxipime is all he needs right now.  His hemoglobin is 7.5. I'll give him 2 units of blood.  I am not sure why his creatinine has bumped up. It is 1.77 today. Again, I would think that this would be some type of medication effect. His sodium is down. His potassium is slightly elevated. This will half to be watched closely.  He has been afebrile. Blood pressure is 133/92 this morning. His lungs sound clear. Cardiac exam is tachycardic but regular. Abdomen is somewhat obese. Abdomen is a little bit distended. There is no obvious splenomegaly.  I will give him a dose of iron today. I think his iron studies show that his iron is on the low side. I don't see a real downside to give him iron.  Again I think he needs 2 units of blood.  I will continue him on the Neupogen for now.  I think if his cultures are negative, then the IV antibiotics can probably be discontinued.  Again I find it somewhat diagnostic that he has responded to Neupogen. His white cell count came up fairly quickly.  Hopefully, he'll be able to go home tomorrow and can be followed up as an outpatient. His mom would really like this.  His mom would like to it him out of bed and into his wheelchair today if possible.  [name]  [place]man Key 5:33

## 2014-03-30 NOTE — Progress Notes (Signed)
ANTIBIOTIC CONSULT NOTE - FOLLOW UP  Pharmacy Consult for Cefepime Indication: rule out sepsis  Allergies  Allergen Reactions  . Adhesive [Tape]     Rips skin off  . Depakote [Divalproex Sodium]     Causes pancreatitis   . Amoxicillin-Pot Clavulanate Rash    Patient Measurements: Height: 4' 11.5" (151.1 cm) Weight: 124 lb 6.4 oz (56.427 kg) IBW/kg (Calculated) : 48.85   Vital Signs: Temp: 99.5 F (37.5 C) (02/27 1306) Temp Source: Axillary (02/27 1306) BP: 130/63 mmHg (02/27 1002) Pulse Rate: 64 (02/27 1236) Intake/Output from previous day: 02/26 0701 - 02/27 0700 In: 2303.8 [I.V.:1098.8; Blood:655; IV [id number]:532] Out: -  Intake/Output from this shift: Total I/O In: 383 [I.V.:50; Blood:333] Out: -   Labs:  Recent Labs  03/28/14 2231 03/29/14 0735 03/29/14 2100 03/30/14 0520  WBC 0.7* 0.6* 2.2* 6.8  HGB 7.7* 5.6* 8.1* 7.5*  PLT 102* 85* 83* 113*  CREATININE 0.62 0.58  --  1.77*   Estimated Creatinine Clearance: 44.9 mL/min (by C-G formula based on Cr of 1.77). No results for input(s): VANCOTROUGH, VANCOPEAK, VANCORANDOM, GENTTROUGH, GENTPEAK, GENTRANDOM, TOBRATROUGH, TOBRAPEAK, TOBRARND, AMIKACINPEAK, AMIKACINTROU, AMIKACIN in the last 72 hours.   Microbiology: Recent Results (from the past 720 hour(s))  Blood culture (routine x 2)     Status: None   Collection Time: 03/01/14  8:20 PM  Result Value Ref Range Status   Specimen Description BLOOD RIGHT PORTA CART  Final   Special Requests BOTTLES DRAWN AEROBIC AND ANAEROBIC [facility] EACH  Final   Culture   Final    NO GROWTH 5 DAYS Performed at [facility]    Report Status 03/07/2014 FINAL  Final  MRSA PCR Screening     Status: None   Collection Time: 03/02/14  1:53 AM  Result Value Ref Range Status   MRSA by PCR NEGATIVE NEGATIVE Final    Comment:        The GeneXpert MRSA Assay (FDA approved for NASAL specimens only), is one component of a comprehensive MRSA colonization surveillance  program. It is not intended to diagnose MRSA infection nor to guide or monitor treatment for MRSA infections.   Clostridium Difficile by PCR     Status: None   Collection Time: 03/02/14  2:47 PM  Result Value Ref Range Status   C difficile by pcr NEGATIVE NEGATIVE Final  Blood culture (routine x 2)     Status: None (Preliminary result)   Collection Time: 03/28/14 10:31 PM  Result Value Ref Range Status   Specimen Description BLOOD LEFT HAND  Final   Special Requests BOTTLES DRAWN AEROBIC AND ANAEROBIC 5CC  Final   Culture   Final           BLOOD CULTURE RECEIVED NO GROWTH TO DATE CULTURE WILL BE HELD FOR 5 DAYS BEFORE ISSUING A FINAL NEGATIVE REPORT Performed at [facility]    Report Status PENDING  Incomplete  Blood culture (routine x 2)     Status: None (Preliminary result)   Collection Time: 03/28/14 10:56 PM  Result Value Ref Range Status   Specimen Description BLOOD RIGHT PORTA CATH  Final   Special Requests BOTTLES DRAWN AEROBIC AND ANAEROBIC 5CC  Final   Culture   Final           BLOOD CULTURE RECEIVED NO GROWTH TO DATE CULTURE WILL BE HELD FOR 5 DAYS BEFORE ISSUING A FINAL NEGATIVE REPORT Performed at [facility]    Report Status PENDING  Incomplete  Urine culture     Status: None   Collection Time: 03/29/14 12:24 AM  Result Value Ref Range Status   Specimen Description URINE, CATHETERIZED  Final   Special Requests NONE  Final   Colony Count NO GROWTH Performed at [facility]   Final   Culture NO GROWTH Performed at [facility]   Final   Report Status 03/30/2014 FINAL  Final  MRSA PCR Screening     Status: None   Collection Time: 03/29/14  6:19 AM  Result Value Ref Range Status   MRSA by PCR NEGATIVE NEGATIVE Final    Comment:        The GeneXpert MRSA Assay (FDA approved for NASAL specimens only), is one component of a comprehensive MRSA colonization surveillance program. It is not intended to diagnose MRSA infection  nor to guide or monitor treatment for MRSA infections.     Anti-infectives    Start     Dose/Rate Route Frequency Ordered Stop   03/29/14 1500  fluconazole (DIFLUCAN) 40 MG/ML suspension 100 mg     100 mg Oral Daily 03/29/14 1345     03/29/14 1400  ceFEPIme (MAXIPIME) 2 g in dextrose 5 % 50 mL IVPB     2 g 100 mL/hr over 30 Minutes Intravenous Every 8 hours 03/29/14 1112     03/29/14 1300  vancomycin (VANCOCIN) IVPB 1000 mg/200 mL premix  Status:  Discontinued     1,000 mg 200 mL/hr over 60 Minutes Intravenous Every 8 hours 03/29/14 1112 03/29/14 2204   03/29/14 0800  vancomycin (VANCOCIN) IVPB 1000 mg/200 mL premix  Status:  Discontinued     1,000 mg 200 mL/hr over 60 Minutes Intravenous Every 8 hours 03/29/14 0037 03/29/14 1112   03/29/14 0600  piperacillin-tazobactam (ZOSYN) IVPB 3.375 g  Status:  Discontinued     3.375 g 12.5 mL/hr over 240 Minutes Intravenous Every 8 hours 03/29/14 0037 03/29/14 0249   03/29/14 0600  ceFEPIme (MAXIPIME) 2 g in dextrose 5 % 50 mL IVPB  Status:  Discontinued     2 g 100 mL/hr over 30 Minutes Intravenous 3 times per day 03/29/14 0251 03/29/14 1112   03/28/14 2315  piperacillin-tazobactam (ZOSYN) IVPB 3.375 g     3.375 g 100 mL/hr over 30 Minutes Intravenous  Once 03/28/14 2300 03/28/14 2356   03/28/14 2315  vancomycin (VANCOCIN) IVPB 1000 mg/200 mL premix     1,000 mg 200 mL/hr over 60 Minutes Intravenous  Once 03/28/14 2300 03/29/14 0045      Assessment: 71 yoM with recent admission for pancytopenia with severe neutropenia presents from home with concern for sepsis secondary to fever.  PMH includes cerebral palsy, spastic quadriplegia, history of IVH.  Patient met SIRS criteria and was started on Vancomycin and Zosyn. Vancomycin discontinued on 2/26 and Zosyn changed to cefepime.  Day 3 of antibiotic therapy. Patient found to be neutropenic, and was started on Filgrastim.  ANC has improved, now to 5.8. WBC improving, cultures remain negative,  patient now afebrile.  Patients renal function has worsened.  His SCr 1.77. Will decrease the frequency of cefepime to adjust for worsening renal function.  Of note, patient is currently on fluconazole.  Per Med Rec, it appears this medication was completed as an outpatient.   Goal of Therapy:  Improvement of symptoms and resolution of infection  Plan:  Per Oncology, continue cefepime, likely will discontinue and discharge home on 2/28 Decrease Cefepime to 2 g IV q12h based on worsening  renal function Follow-up use of Fluconazole - Appears he completed therapy in early February - consider D/Cing agent Monitor cultures and renal function  Theron Arista, PharmD Clinical Pharmacist - Resident Pager: [phone] 2/27/20161:53 PM

## 2014-03-30 NOTE — Progress Notes (Signed)
At 0323 patient's O2 sats dropped to 60% and HR 120-130s irregular. Found patient to be having a grand-mal seizure with head and eyes turned to right and arms, legs, and head twitching/jerking. Removed Bipap and placed on [place] 6L O2, elevated HOB. Seizure lasted 40 seconds. Patient post-ictal somnolent. Patient's mother at bedside during the seizure. She did not want any treatment given to the patient. She wants [place] to continue to monitor and if the patient has another seizure then she wants to give Diastat 12.5mg  rectally. Fredirick Maudlin, NP made aware and will continue to monitor.

## 2014-03-31 DIAGNOSIS — R7989 Other specified abnormal findings of blood chemistry: Secondary | ICD-10-CM

## 2014-03-31 LAB — TYPE AND SCREEN
ABO/RH(D): O POS
ABO/RH(D): O POS
Antibody Screen: NEGATIVE
Antibody Screen: NEGATIVE
UNIT DIVISION: 0
Unit division: 0
Unit division: 0
Unit division: 0
Unit division: 0

## 2014-03-31 LAB — GLUCOSE, CAPILLARY
GLUCOSE-CAPILLARY: 88 mg/dL (ref 70–99)
GLUCOSE-CAPILLARY: 83 mg/dL (ref 70–99)
GLUCOSE-CAPILLARY: 88 mg/dL (ref 70–99)
Glucose-Capillary: 90 mg/dL (ref 70–99)
Glucose-Capillary: 79 mg/dL (ref 70–99)
Glucose-Capillary: 91 mg/dL (ref 70–99)

## 2014-03-31 LAB — BASIC METABOLIC PANEL
Anion gap: 9 (ref 5–15)
BUN: 9 mg/dL (ref 6–23)
CALCIUM: 8.9 mg/dL (ref 8.4–10.5)
CO2: 29 mmol/L (ref 19–32)
Chloride: 107 mmol/L (ref 96–112)
Creatinine, Ser: 0.61 mg/dL (ref 0.50–1.35)
GFR calc Af Amer: >90 mL/min (ref >90)
GFR calc non Af Amer: >90 mL/min (ref >90)
Glucose, Bld: 86 mg/dL (ref 70–99)
POTASSIUM: 3.3 mmol/L — AB (ref 3.5–5.1)
SODIUM: 145 mmol/L (ref 135–145)

## 2014-03-31 LAB — CBC
HEMATOCRIT: 36.6 % — AB (ref 39.0–52.0)
HEMOGLOBIN: 12.2 g/dL — AB (ref 13.0–17.0)
MCH: 29.8 pg (ref 26.0–34.0)
MCHC: 33.3 g/dL (ref 30.0–36.0)
MCV: 89.5 fL (ref 78.0–100.0)
Platelets: 74 10*3/uL — ABNORMAL LOW (ref 150–400)
RBC: 4.09 MIL/uL — ABNORMAL LOW (ref 4.22–5.81)
RDW: 16.5 % — AB (ref 11.5–15.5)
WBC: 4.3 10*3/uL (ref 4.0–10.5)

## 2014-03-31 MED ORDER — ACETAMINOPHEN 160 MG/5ML PO SOLN
650.0000 mg | Freq: Four times a day (QID) | ORAL | Status: DC | PRN
  Administered 2014-03-31: 650 mg
  Filled 2014-03-31: qty 20.3

## 2014-03-31 MED ORDER — POTASSIUM CHLORIDE 20 MEQ PO PACK
40.0000 meq | PACK | Freq: Once | ORAL | Status: DC
  Filled 2014-03-31: qty 2

## 2014-03-31 MED ORDER — POTASSIUM CHLORIDE 20 MEQ/15ML (10%) PO SOLN
40.0000 meq | Freq: Once | ORAL | Status: AC
  Administered 2014-03-31: 40 meq
  Filled 2014-03-31: qty 30

## 2014-03-31 MED ORDER — ACETAMINOPHEN 160 MG/5ML PO SOLN: 650.0000 mg | Freq: Four times a day (QID) | ORAL | Status: DC | PRN

## 2014-03-31 NOTE — Progress Notes (Signed)
Jeffery [name] did pretty well yesterday. He got 2 units of blood. His erythropoietin level is pending.  He has been afebrile. His cultures, to date, have all been negative. He currently is on Maxipime. I wonder if this could be stopped after today.  He is still on Neupogen. His white cell count is holding steady at 4.3.  With the 2 units of blood, hemoglobin is 12.2.  I don't think there been any more seizures. The neurologist are dealing with his seizure medications.  His blood count is holding steady. It would It would not surprise me if this goes out little bit since he got IV iron.  His vital signs are all stable. His blood pressure is 118/98. He is afebrile with a temperature of 98.9.  His weight no change in his physical exam.  I will go ahead with the Neupogen today.  His mom wants her to stay 1 more day. I certainly have no problems with this. I told her that this was up to the primary team taking care of him.  We will continue to follow along.   [name]  Ephesians 4:32

## 2014-03-31 NOTE — Progress Notes (Signed)
PATIENT DETAILS Name: Jeffery [name] Age: 24 y.o. Sex: male Date of Birth: January 26, 1991 Admit Date: 03/28/2014 Admitting Physician Rise Patience, MD QTM:[id number], DAVID C, MD  Subjective: No major events overnight-some bronchospasms-but no seizuers  Assessment/Plan: Principal Problem:   SIRS (systemic inflammatory response syndrome):secondary to neutropenic fever. Blood cultures continued to be negative, on admission started on IV vancomycin and cefepime, vancomycin was discontinued on 2/26, since cultures continue to be negative, patient afebrile, suspect we can discontinue cefepime and just monitor off antibiotics.  Active Problems:   Neutropenic Fever: foci of infection not apparent. Blood cultures neg. started on empiric vancomycin and cefepime on admission, vancomycin was discontinued on 2/26, since all cultures negative, WBC improving nicely after Neupogen, we will stop cefepime and monitor off antibiotics. Etiology of neutropenia felt secondary to Keppra.Followed by hematology during this admission. now afebrile.   Pancytopenia:Etiology felt to be related to medications-Keppra. Received 2 units of PRBC on 2/26 and 2 more on 2/27. WBC better with Neupogen. Follow CBC   ARF: Resolved, likely prerenal azotemia.   Hyponatremia: Likely secondary to dehydration, resolved.    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    Seizure disorder: Vimpat was discontinued prior to admission, Keppra now discontinued as well. Spoke with pediatric neurology on call for Dr. Imelda Pillow. Dr Steva Ready recommended to start [facility]. Continue prn Valium. I will try and get in touch with Dr. Gaynell Face when his office opens up on Monday    Disposition: Remain inpatient-in SDU-suspect home 2/29  Antibiotics: See below  Anti-infectives    Start     Dose/Rate Route Frequency Ordered Stop   03/30/14 1800  ceFEPIme (MAXIPIME) 2 g in dextrose 5 % 50 mL IVPB     2  g 100 mL/hr over 30 Minutes Intravenous Every 12 hours 03/30/14 1354     03/29/14 1500  fluconazole (DIFLUCAN) 40 MG/ML suspension 100 mg     100 mg Oral Daily 03/29/14 1345     03/29/14 1400  ceFEPIme (MAXIPIME) 2 g in dextrose 5 % 50 mL IVPB  Status:  Discontinued     2 g 100 mL/hr over 30 Minutes Intravenous Every 8 hours 03/29/14 1112 03/30/14 1354   03/29/14 1300  vancomycin (VANCOCIN) IVPB 1000 mg/200 mL premix  Status:  Discontinued     1,000 mg 200 mL/hr over 60 Minutes Intravenous Every 8 hours 03/29/14 1112 03/29/14 2204   03/29/14 0800  vancomycin (VANCOCIN) IVPB 1000 mg/200 mL premix  Status:  Discontinued     1,000 mg 200 mL/hr over 60 Minutes Intravenous Every 8 hours 03/29/14 0037 03/29/14 1112   03/29/14 0600  piperacillin-tazobactam (ZOSYN) IVPB 3.375 g  Status:  Discontinued     3.375 g 12.5 mL/hr over 240 Minutes Intravenous Every 8 hours 03/29/14 0037 03/29/14 0249   03/29/14 0600  ceFEPIme (MAXIPIME) 2 g in dextrose 5 % 50 mL IVPB  Status:  Discontinued     2 g 100 mL/hr over 30 Minutes Intravenous 3 times per day 03/29/14 0251 03/29/14 1112   03/28/14 2315  piperacillin-tazobactam (ZOSYN) IVPB 3.375 g     3.375 g 100 mL/hr over 30 Minutes Intravenous  Once 03/28/14 2300 03/28/14 2356   03/28/14 2315  vancomycin (VANCOCIN) IVPB 1000 mg/200 mL premix     1,000 mg 200 mL/hr over 60 Minutes Intravenous  Once 03/28/14 2300 03/29/14 0045      DVT Prophylaxis:  SCD's  Code Status: Partial Code-No  CPR or defibrillation. Only short term intubation  Family Communication Mother at bedside  Procedures:  None  CONSULTS:  hematology/oncology   MEDICATIONS: Scheduled Meds: . acetaminophen  650 mg Oral Once  . antiseptic oral rinse  7 mL Mouth Rinse q12n4p  . ascorbic acid  300 mg Per Tube QHS  . calcium carbonate (dosed in mg elemental calcium)  1,250 mg of elemental calcium Per Tube TID WC  . ceFEPime (MAXIPIME) IV  2 g Intravenous Q12H  . chlorhexidine  15  mL Mouth Rinse BID  . cloBAZam  5 mg Per Tube BID  . feeding supplement (PRO-STAT SUGAR FREE 64)  30 mL Per Tube BID  . [START ON 04/02/2014] ferumoxytol  510 mg Intravenous Once  . filgrastim  480 mcg Subcutaneous Daily  . fluconazole  100 mg Oral Daily  . folic acid  1 mg Oral Daily  . free water  172 mL Per Tube QID  . gabapentin  900 mg Per Tube 3 times per day  . lansoprazole  30 mg Per Tube BID  . levalbuterol  0.63 mg Nebulization Q6H  . multivitamin  10 mL Per Tube Daily  . simethicone  120 mg Oral TID  . sucralfate  1 g Per Tube 4 times per day  . Two Cal HN tube feeding  560 mL Per Tube Q24H  . zinc oxide  1 application Topical 5 X Daily   Continuous Infusions: . baclofen    . dextrose 5 % and 0.9% NaCl 100 mL/hr at 03/31/14 0639   PRN Meds:.acetaminophen (TYLENOL) oral liquid 160 mg/5 mL, acetaminophen **OR** acetaminophen, diazepam, dicyclomine, diphenoxylate-atropine, levalbuterol, mupirocin ointment, ondansetron **OR** ondansetron (ZOFRAN) IV    PHYSICAL EXAM: Vital signs in last 24 hours: Filed Vitals:   03/31/14 0353 03/31/14 0638 03/31/14 0700 03/31/14 0838  BP: 118/98   139/110  Pulse: 79   94  Temp: 98.9 F (37.2 C)  98.7 F (37.1 C)   TempSrc: Axillary  Axillary   Resp: 18   15  Height:      Weight:      SpO2: 94% 94%  94%    Weight change:  Filed Weights   03/28/14 2219 03/29/14 0452  Weight: 58.514 kg (129 lb) 56.427 kg (124 lb 6.4 oz)   Body mass index is 24.71 kg/(m^2).   Gen Exam: Awake this morning. Not in any distress  Neck: Supple, No JVD.   Chest: B/L Clear.   CVS: S1 S2 Regular, no murmurs.  Abdomen: soft, BS +, non tender, non distended.  Extremities: no edema, lower extremities warm to touch.  Intake/Output from previous day:  Intake/Output Summary (Last 24 hours) at 03/31/14 1048 Last data filed at 03/31/14 1607  Gross per 24 hour  Intake   2678 ml  Output      0 ml  Net   2678 ml     LAB RESULTS: CBC  Recent  Labs Lab 03/28/14 2231 03/29/14 0735 03/29/14 2100 03/30/14 0520 03/30/14 2045 03/31/14 0350  WBC 0.7* 0.6* 2.2* 6.8 4.0 4.3  HGB 7.7* 5.6* 8.1* 7.5* 11.1* 12.2*  HCT 23.7* 17.7* 24.5* 23.9* 33.6* 36.6*  PLT 102* 85* 83* 113* 75* 74*  MCV 95.6 96.7 89.1 93.4 87.7 89.5  MCH 31.0 30.6 29.5 29.3 29.0 29.8  MCHC 32.5 31.6 33.1 31.4 33.0 33.3  RDW 16.4* 16.5* 17.0* 18.1* 16.1* 16.5*  LYMPHSABS 0.2* 0.2* 0.3* 0.5* 0.7  --   MONOABS 0.2 0.1 0.3 0.5 0.8  --  EOSABS 0.0 0.0 0.0 0.1 0.0  --   BASOSABS 0.0 0.0 0.0 0.0 0.0  --     Chemistries   Recent Labs Lab 03/29/14 0735 03/30/14 0520 03/30/14 1555 03/30/14 2045 03/31/14 0350  NA 141 129* 143 144 145  K 3.3* 4.7 3.1* 3.0* 3.3*  CL 105 102 104 107 107  CO2 27 24 32 29 29  GLUCOSE 102* 112* 84 75 86  BUN 12 40* 8 10 9   CREATININE 0.58 1.77* 0.61 0.56 0.61  CALCIUM 7.8* 7.7* 8.9 8.8 8.9    CBG:  Recent Labs Lab 03/30/14 1412 03/30/14 1821 03/30/14 2310 03/31/14 0350 03/31/14 0833  GLUCAP 85 77 105* 90 88    GFR Estimated Creatinine Clearance: 99.3 mL/min (by C-G formula based on Cr of 0.61).  Coagulation profile  Recent Labs Lab 03/29/14 0900  INR 1.34    Cardiac Enzymes No results for input(s): CKMB, TROPONINI, MYOGLOBIN in the last 168 hours.  Invalid input(s): CK  Invalid input(s): POCBNP No results for input(s): DDIMER in the last 72 hours. No results for input(s): HGBA1C in the last 72 hours. No results for input(s): CHOL, HDL, LDLCALC, TRIG, CHOLHDL, LDLDIRECT in the last 72 hours. No results for input(s): TSH, T4TOTAL, T3FREE, THYROIDAB in the last 72 hours.  Invalid input(s): FREET3  Recent Labs  03/29/14 0900  VITAMINB12 539  FOLATE >20.0  FERRITIN 577*  TIBC Not calculated due to Iron <10.  IRON <10*  RETICCTPCT 1.2   No results for input(s): LIPASE, AMYLASE in the last 72 hours.  Urine Studies No results for input(s): UHGB, CRYS in the last 72 hours.  Invalid input(s): UACOL,  UAPR, USPG, UPH, UTP, UGL, UKET, UBIL, UNIT, UROB, ULEU, UEPI, UWBC, URBC, UBAC, CAST, UCOM, BILUA  MICROBIOLOGY: Recent Results (from the past 240 hour(s))  Blood culture (routine x 2)     Status: None (Preliminary result)   Collection Time: 03/28/14 10:31 PM  Result Value Ref Range Status   Specimen Description BLOOD LEFT HAND  Final   Special Requests BOTTLES DRAWN AEROBIC AND ANAEROBIC 5CC  Final   Culture   Final           BLOOD CULTURE RECEIVED NO GROWTH TO DATE CULTURE WILL BE HELD FOR 5 DAYS BEFORE ISSUING A FINAL NEGATIVE REPORT Performed at [facility]    Report Status PENDING  Incomplete  Blood culture (routine x 2)     Status: None (Preliminary result)   Collection Time: 03/28/14 10:56 PM  Result Value Ref Range Status   Specimen Description BLOOD RIGHT PORTA CATH  Final   Special Requests BOTTLES DRAWN AEROBIC AND ANAEROBIC 5CC  Final   Culture   Final           BLOOD CULTURE RECEIVED NO GROWTH TO DATE CULTURE WILL BE HELD FOR 5 DAYS BEFORE ISSUING A FINAL NEGATIVE REPORT Performed at [facility]    Report Status PENDING  Incomplete  Urine culture     Status: None   Collection Time: 03/29/14 12:24 AM  Result Value Ref Range Status   Specimen Description URINE, CATHETERIZED  Final   Special Requests NONE  Final   Colony Count NO GROWTH Performed at [facility]   Final   Culture NO GROWTH Performed at [facility]   Final   Report Status 03/30/2014 FINAL  Final  MRSA PCR Screening     Status: None   Collection Time: 03/29/14  6:19 AM  Result Value  Ref Range Status   MRSA by PCR NEGATIVE NEGATIVE Final    Comment:        The GeneXpert MRSA Assay (FDA approved for NASAL specimens only), is one component of a comprehensive MRSA colonization surveillance program. It is not intended to diagnose MRSA infection nor to guide or monitor treatment for MRSA infections.     RADIOLOGY STUDIES/RESULTS: Dg Chest Port 1 View  (if  Code Sepsis Called)  03/28/2014   CLINICAL DATA:  Fever.  Code sepsis.  EXAM: PORTABLE CHEST - 1 VIEW  COMPARISON:  03/01/2014  FINDINGS: Shallow inspiration. Normal heart size and pulmonary vascularity. Power port type central venous catheter with tip over the cavoatrial junction. Linear atelectasis in the left lung base. No focal consolidation. No pneumothorax. No blunting of costophrenic angles. Postoperative changes with posterior rod fixation of the thoracic and visualized lumbar spine.  IMPRESSION: Shallow inspiration with linear atelectasis in the left lung base.   Electronically Signed   By: Lucienne Capers M.D.   On: 03/28/2014 23:07   Dg Chest Portable 1 View  03/01/2014   CLINICAL DATA:  Anemia. Chronic right lower lobe pneumonia. Neuro muscular disease.  EXAM: PORTABLE CHEST - 1 VIEW  COMPARISON:  11/06/2013  FINDINGS: Right jugular port appears unchanged in position. Spinal fixation hardware again noted.  There are low lung volumes. No airspace consolidation is evident. No large effusions are evident.  IMPRESSION: No acute findings   Electronically Signed   By: Andreas Newport M.D.   On: 03/01/2014 20:16    Oren Binet, MD  [facility] Pager:[phone]  If 7PM-7AM, please contact night-coverage [email or web address] Password [facility] 03/31/2014, 10:48 AM   LOS: 2 days

## 2014-04-01 ENCOUNTER — Other Ambulatory Visit: Payer: Self-pay | Admitting: Hematology & Oncology

## 2014-04-01 DIAGNOSIS — D61818 Other pancytopenia: Secondary | ICD-10-CM

## 2014-04-01 LAB — CBC
HEMATOCRIT: 34 % — AB (ref 39.0–52.0)
HEMOGLOBIN: 11.4 g/dL — AB (ref 13.0–17.0)
MCH: 29.7 pg (ref 26.0–34.0)
MCHC: 33.5 g/dL (ref 30.0–36.0)
MCV: 88.5 fL (ref 78.0–100.0)
Platelets: 73 10*3/uL — ABNORMAL LOW (ref 150–400)
RBC: 3.84 MIL/uL — AB (ref 4.22–5.81)
RDW: 16.6 % — ABNORMAL HIGH (ref 11.5–15.5)
WBC: 3.4 10*3/uL — AB (ref 4.0–10.5)

## 2014-04-01 LAB — GLUCOSE, CAPILLARY
GLUCOSE-CAPILLARY: 78 mg/dL (ref 70–99)
Glucose-Capillary: 84 mg/dL (ref 70–99)
Glucose-Capillary: 82 mg/dL (ref 70–99)

## 2014-04-01 MED ORDER — HEPARIN SOD (PORK) LOCK FLUSH 100 UNIT/ML IV SOLN
500.0000 [IU] | INTRAVENOUS | Status: AC | PRN
  Administered 2014-04-01: 500 [IU]

## 2014-04-01 MED ORDER — CLOBAZAM 2.5 MG/ML PO SUSP: 5.0000 mg | Freq: Two times a day (BID) | ORAL | Status: DC

## 2014-04-01 MED ORDER — SODIUM CHLORIDE 0.9 % IJ SOLN
10.0000 mL | INTRAMUSCULAR | Status: DC | PRN
  Administered 2014-04-01: 10 mL

## 2014-04-01 MED ORDER — SODIUM CHLORIDE 0.9 % IJ SOLN: 10.0000 mL | Freq: Two times a day (BID) | INTRAMUSCULAR | Status: DC

## 2014-04-01 NOTE — Discharge Summary (Signed)
PATIENT DETAILS Name: Jeffery [name] Age: 24 y.o. Sex: male Date of Birth: 1990-03-03 MRN: [id number]. Admitting Physician: Rise Patience, MD BLT:[id number], DAVID Loletha Grayer, MD  Admit Date: 03/28/2014 Discharge date: 04/01/2014  Recommendations for Outpatient Follow-up:  1. New Medication:ONFI. Now off both Keppra and Vimpat. 2. Will need repeat CBC in 1 week 3. Ensure follow up with Dr's Hickling and Ennever  PRIMARY DISCHARGE DIAGNOSIS:  Principal Problem:   SIRS (systemic inflammatory response syndrome) Active Problems:   Congenital spastic quadriplegia   Pancytopenia   Neutropenic fever      PAST MEDICAL HISTORY: Past Medical History  Diagnosis Date  . CP (cerebral palsy), spastic, quadriplegic   . Epilepsy   . Osteoporosis   . Inguinal hernia   . Undescended testes   . Seasonal allergies   . IVH (intraventricular hemorrhage)     Grade IV  . Hip dislocation, bilateral   . Seizures   . Hx: UTI (urinary tract infection)   . Dysphagia   . Otitis media   . Retinopathy of prematurity   . 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 Neuromuscular scoliosis   . Osteoporosis   . Complex partial seizures   . Generalized convulsive epilepsy without mention of intractable epilepsy   . Epilepsy   . Sinus bradycardia     HR drops to 38-40 while sleeping  . Sleep apnea     BiPAP  . Blister of right heel     fluid filled; origin unknown  . Kidney stones     ?  Marland Kitchen Pneumonia      chronic pneumonia ,respitory failure dx Augest 2014    DISCHARGE MEDICATIONS: Current Discharge Medication List    START taking these medications   Details  cloBAZam (ONFI) 2.5 MG/ML solution Place 2 mLs (5 mg total) into feeding tube 2 (two) times daily. Qty: 120 mL, Refills: 1      CONTINUE these medications which have NOT CHANGED   Details  albuterol (PROVENTIL HFA;VENTOLIN HFA) 108 (90 BASE) MCG/ACT inhaler Inhale 2 puffs into the lungs every 4 (four) hours as needed for shortness of  breath.    albuterol (PROVENTIL) (2.5 MG/3ML) 0.083% nebulizer solution Take 2.5 mg by nebulization. 1 ampule twice daily and as needed (making 4 doses daily)    Ascorbic Acid (VITAMIN C) 500 MG/5ML LIQD 300 mg by PEG Tube route at bedtime.     baclofen (GABLOFEN) 40000 MCG/20ML SOLN 385.2 mcg by Intrathecal route continuous.     Botulinum Toxin Type A, Cosm, (BOTOX COSMETIC) 50 UNITS SOLR Inject 300 Units into the muscle.    calcium carbonate, dosed in mg elemental calcium, 1250 MG/5ML 1,250 mg of elemental calcium by PEG Tube route 3 (three) times daily. 5 cc via peg tube    diazepam (DIASTAT ACUDIAL) 10 MG GEL Place 12.5 mg rectally as needed for seizure.     dicyclomine (BENTYL) 10 MG/5ML syrup Place into feeding tube every 8 (eight) hours as needed. Not to exceed 5 doses in 1 wk    diphenoxylate-atropine (LOMOTIL) 2.5-0.025 MG/5ML liquid Place into feeding tube 4 (four) times daily as needed for diarrhea or loose stools. 1-2 tsp via feeding tube q6h prn loose stools    folic acid (FOLVITE) 1 MG tablet Take 1 mg by mouth daily.    furosemide (LASIX) 10 MG/ML solution Place 20 mg into feeding tube daily as needed for fluid.     gabapentin (NEURONTIN) 250 MG/5ML solution Take 18 mL 3  times daily Qty: 1620 mL, Refills: 5   Associated Diagnoses: Hip pain, unspecified laterality    ketoconazole (NIZORAL) 2 % cream Apply 1 application topically 2 (two) times daily as needed for irritation.    lansoprazole (PREVACID SOLUTAB) 30 MG disintegrating tablet Take 30 mg by mouth 2 (two) times daily. 730am    midazolam (VERSED) 5 MG/ML injection Place 2 mLs (10 mg total) into the nose once. Draw up 61ml in 2 syringes. Remove blue vial access device. Attach syringe to nasal atomizer for intranasal administration. Give 84ml in right nostril x 2 for seizures lasting 2 minutes or longer or for repetitive seizures in a short period of time. Qty: 6 mL, Refills: 3    Multiple Vitamin (MULTIVITAMIN) LIQD  10 mLs by PEG Tube route daily.    Multiple Vitamins-Minerals (ZINC PO) 5 mLs by PEG Tube route daily. 244 mg / 5 mL zinc solution    mupirocin ointment (BACTROBAN) 2 % Apply 1 application topically as needed (to G-T site). Qty: 22 g, Refills: 1    !! Nutritional Supplements (PROMOD PO) Take 30 mLs by mouth 2 (two) times daily. Noon and 10pm    !! Nutritional Supplements (TWOCAL HN) LIQD Take 237 mLs by mouth. T.3 can at 46 cc hour x 12 hours - water 172 cc pre and post each and extra 172 bid.    OVER THE COUNTER MEDICATION Apply 1 application topically as needed (after diaper changes). Balmex and Bag Balm paste made and applied after diaper changes    OXYGEN-HELIUM IN Inhale into the lungs. Oxygen PRN to keep O2 Sat at 90%    Pseudoephedrine HCl (SUDAFED CHILDRENS) 15 MG/5ML LIQD Give 90 mg by tube every 6 (six) hours.     simethicone (MYLICON) 440 MG chewable tablet Chew 125 mg by mouth every 6 (six) hours as needed for flatulence.    sodium phosphate (FLEET) enema Place 1 enema rectally. follow package directions PRN    sucralfate (CARAFATE) 1 GM/10ML suspension Place 1 g into feeding tube 4 (four) times daily. 7am, 130pm, 5pm, 8pm    Water For Irrigation, Sterile (FREE WATER) SOLN Place 172 mLs into feeding tube 4 (four) times daily.    zinc oxide (BALMEX) 11.3 % CREA cream Apply 1 application topically 5 (five) times daily. With each diaper change.    docusate (COLACE) 50 MG/5ML liquid Take 50 mg by mouth daily as needed for mild constipation.    miconazole (MICOTIN) 2 % cream Apply topically 2 (two) times daily. Qty: 28.35 g, Refills: 1    nystatin cream (MYCOSTATIN) Apply 3,474,259 application topically. 3-4 times daily    polyethylene glycol (MIRALAX / GLYCOLAX) packet Take 17 g by mouth daily.     !! - Potential duplicate medications found. Please discuss with provider.    STOP taking these medications     KEPPRA 100 MG/ML solution      fluconazole (DIFLUCAN) 40  MG/ML suspension      VIMPAT 10 MG/ML SOLN         ALLERGIES:   Allergies  Allergen Reactions  . Adhesive [Tape]     Rips skin off  . Depakote [Divalproex Sodium]     Causes pancreatitis   . Amoxicillin-Pot Clavulanate Rash    BRIEF HPI:  See H&P, Labs, Consult and Test reports for all details in brief, patient  is a 24 y.o. male with history of cerebral palsy, spastic quadriplegia, seizure disorder, history of intraventricular hemorrhage admitted for evaluation of fever.  Patient was found to have severe neutropenia.   CONSULTATIONS:   hematology/oncology  PERTINENT RADIOLOGIC STUDIES: Dg Chest Port 1 View  (if Code Sepsis Called)  03/28/2014   CLINICAL DATA:  Fever.  Code sepsis.  EXAM: PORTABLE CHEST - 1 VIEW  COMPARISON:  03/01/2014  FINDINGS: Shallow inspiration. Normal heart size and pulmonary vascularity. Power port type central venous catheter with tip over the cavoatrial junction. Linear atelectasis in the left lung base. No focal consolidation. No pneumothorax. No blunting of costophrenic angles. Postoperative changes with posterior rod fixation of the thoracic and visualized lumbar spine.  IMPRESSION: Shallow inspiration with linear atelectasis in the left lung base.   Electronically Signed   By: Lucienne Capers M.D.   On: 03/28/2014 23:07     PERTINENT LAB RESULTS: CBC:  Recent Labs  03/31/14 0350 04/01/14 0505  WBC 4.3 3.4*  HGB 12.2* 11.4*  HCT 36.6* 34.0*  PLT 74* 73*   CMET CMP     Component Value Date/Time   NA 145 03/31/2014 0350   K 3.3* 03/31/2014 0350   CL 107 03/31/2014 0350   CO2 29 03/31/2014 0350   GLUCOSE 86 03/31/2014 0350   BUN 9 03/31/2014 0350   CREATININE 0.61 03/31/2014 0350   CALCIUM 8.9 03/31/2014 0350   PROT 5.5* 03/29/2014 0735   ALBUMIN 2.7* 03/29/2014 0735   AST 19 03/29/2014 0735   ALT 29 03/29/2014 0735   ALKPHOS 98 03/29/2014 0735   BILITOT 0.9 03/29/2014 0735   GFRNONAA >90 03/31/2014 0350   GFRAA >90 03/31/2014  0350    GFR Estimated Creatinine Clearance: 99.3 mL/min (by C-G formula based on Cr of 0.61). No results for input(s): LIPASE, AMYLASE in the last 72 hours. No results for input(s): CKTOTAL, CKMB, CKMBINDEX, TROPONINI in the last 72 hours. Invalid input(s): POCBNP No results for input(s): DDIMER in the last 72 hours. No results for input(s): HGBA1C in the last 72 hours. No results for input(s): CHOL, HDL, LDLCALC, TRIG, CHOLHDL, LDLDIRECT in the last 72 hours. No results for input(s): TSH, T4TOTAL, T3FREE, THYROIDAB in the last 72 hours.  Invalid input(s): FREET3 No results for input(s): VITAMINB12, FOLATE, FERRITIN, TIBC, IRON, RETICCTPCT in the last 72 hours. Coags: No results for input(s): INR in the last 72 hours.  Invalid input(s): PT Microbiology: Recent Results (from the past 240 hour(s))  Blood culture (routine x 2)     Status: None (Preliminary result)   Collection Time: 03/28/14 10:31 PM  Result Value Ref Range Status   Specimen Description BLOOD LEFT HAND  Final   Special Requests BOTTLES DRAWN AEROBIC AND ANAEROBIC 5CC  Final   Culture   Final           BLOOD CULTURE RECEIVED NO GROWTH TO DATE CULTURE WILL BE HELD FOR 5 DAYS BEFORE ISSUING A FINAL NEGATIVE REPORT Performed at [facility]    Report Status PENDING  Incomplete  Blood culture (routine x 2)     Status: None (Preliminary result)   Collection Time: 03/28/14 10:56 PM  Result Value Ref Range Status   Specimen Description BLOOD RIGHT PORTA CATH  Final   Special Requests BOTTLES DRAWN AEROBIC AND ANAEROBIC 5CC  Final   Culture   Final           BLOOD CULTURE RECEIVED NO GROWTH TO DATE CULTURE WILL BE HELD FOR 5 DAYS BEFORE ISSUING A FINAL NEGATIVE REPORT Performed at [facility]    Report Status PENDING  Incomplete  Urine  culture     Status: None   Collection Time: 03/29/14 12:24 AM  Result Value Ref Range Status   Specimen Description URINE, CATHETERIZED  Final   Special Requests NONE   Final   Colony Count NO GROWTH Performed at [facility]   Final   Culture NO GROWTH Performed at [facility]   Final   Report Status 03/30/2014 FINAL  Final  MRSA PCR Screening     Status: None   Collection Time: 03/29/14  6:19 AM  Result Value Ref Range Status   MRSA by PCR NEGATIVE NEGATIVE Final    Comment:        The GeneXpert MRSA Assay (FDA approved for NASAL specimens only), is one component of a comprehensive MRSA colonization surveillance program. It is not intended to diagnose MRSA infection nor to guide or monitor treatment for MRSA infections.      BRIEF HOSPITAL COURSE:  SIRS (systemic inflammatory response syndrome):secondary to neutropenic fever. Blood cultures continued to be negative, on admission started on IV vancomycin and cefepime, vancomycin was discontinued on 2/26, since cultures continued to be negative, patient afebrile,  Cefepime was discontinued on 2/28. Patient remains afebrile off all antibiotics.Stable for discharge today  Active Problems:  Neutropenic Fever: foci of infection not apparent. Blood cultures neg. Started on empiric vancomycin and cefepime on admission, vancomycin was discontinued on 2/26, since all cultures negative, WBC improving nicely after Neupogen,  Cefepime was discontinued on 2/28. He remains afebrile off antibiotics. Etiology of neutropenia felt secondary to Keppra.Note Keppra/Vimpat have been discontined, he will follow up with hematology post follow up to monitor CBC and perhaps to do a bone marrow bx at some point.   Pancytopenia:Etiology felt to be related to medications-Keppra. Received 2 units of PRBC on 2/26 and 2 more on 2/27. WBC better with Neupogen. Hb 11.4 on discharge.Follow CBC as outpatient.  ARF: Resolved, likely prerenal azotemia.  Hyponatremia: Likely secondary to dehydration, resolved.   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   Seizure disorder: Vimpat  was discontinued prior to admission, Keppra now discontinued as well.He did have one seizure episode during this hospitalizations. Spoke with pediatric neurology on call for Dr. Imelda Pillow. Dr Steva Ready recommended to start [facility]. Continue prn Valium. This MD spoke with Dr. Gaynell Face on the phone on 2/29, recommendations were to continue [place].   TODAY-DAY OF DISCHARGE:  Subjective:   Jeffery [name] today has no headache,no chest abdominal pain,no new weakness tingling or numbness. No further seizures  Objective:   Blood pressure 127/77, pulse 74, temperature 97.7 F (36.5 C), temperature source Axillary, resp. rate 17, height 4' 11.5" (1.511 m), weight 56.427 kg (124 lb 6.4 oz), SpO2 97 %.  Intake/Output Summary (Last 24 hours) at 04/01/14 1019 Last data filed at 03/31/14 2327  Gross per 24 hour  Intake    221 ml  Output      0 ml  Net    221 ml   Filed Weights   03/28/14 2219 03/29/14 0452  Weight: 58.514 kg (129 lb) 56.427 kg (124 lb 6.4 oz)    Exam Sleeping comfortable-arouses briefly. Chest:b/l clear CVS:S1S2,regular IAX:KPVV NT,ND. Peg in place  DISCHARGE CONDITION: Stable  DISPOSITION: Home  DISCHARGE INSTRUCTIONS:    Activity:  As tolerated   Diet recommendation: Resume Peg feeds  Discharge Instructions    Call MD for:  persistant nausea and vomiting    Complete by:  As directed      Call MD for:  temperature >100.4    Complete by:  As directed      Diet general    Complete by:  As directed   Resume Bolus peg feedings     Increase activity slowly    Complete by:  As directed            Follow-up Information    Follow up with [name], DAVID C, MD. Schedule an appointment as soon as possible for a visit in 1 week.   Specialty:  Internal Medicine   Contact information:   [place] [id number] [phone]       Follow up with Volanda Napoleon, MD.   Specialty:  Oncology   Why:  office will call you   Contact information:     [address], [address] [place] [address] [phone]       Follow up with Jodi Geralds, MD. Schedule an appointment as soon as possible for a visit in 2 weeks.   Specialty:  Pediatrics   Contact information:   [address] [place] [address] [phone]       Total Time spent on discharge equals 45 minutes.  SignedOren Binet 04/01/2014 10:19 AM

## 2014-04-01 NOTE — Progress Notes (Signed)
Jeffery [name] had a good day yesterday. He is off antibiotics. He has been afebrile. All his cultures have been negative.  His last Neupogen dose was the 27th. His white cell count is trending downward. He really needs to have a dose today.  There was no seizure activity yesterday.  His labs look okay. Again, the white cell count is trending downward. It is 3.4 today. His blood count 73 which is stable. His hemoglobin is 11.4.  It sounds like he'll be going home.  We are still holding been off on a bone marrow test. I definitely understand and agree with this. No one on him would be incredibly difficult.  His vital signs are all pretty stable. Blood pressure is 02/04/1957. Temperature 97.7. Pulse is 58. His lungs sound pretty clear. Cardiac exam regular rate and rhythm. Abdomen is soft. Bowel sounds are present. There is no palpable liver or spleen tip. Extremities shows no clubbing, cyanosis or edema. Does have the contractures.  Again, if he goes home today, we will follow-up as an outpatient and see how his blood counts are. He is off the [place]. He is off the Vimpat. He is still quite. Other medications.  We still may end up having to do a bone marrow test on him depending on the blood counts.  [name]

## 2014-04-01 NOTE — Progress Notes (Signed)
Discussed AVS and prescription with pt's caregivers, both verbalize understanding. IV team called, deaccessed portacath. All personal belongings with pt's family. Notified CCMD and Elink, monitor removed. Pt discharging to home.

## 2014-04-02 ENCOUNTER — Telehealth: Payer: Self-pay | Admitting: Hematology & Oncology

## 2014-04-02 LAB — ERYTHROPOIETIN: Erythropoietin: 1100 m[IU]/mL — ABNORMAL HIGH (ref 2.6–18.5)

## 2014-04-02 NOTE — Telephone Encounter (Signed)
Pt aware of 3-2 appointment. Mother is very hard to understand her words are very slurry. She is demanding her son come in for inj today. MD states to bring him in 3-2 that he got inj yesterday. Mother is still wanting to bring him in. Transferred call to RN

## 2014-04-02 NOTE — Telephone Encounter (Signed)
Left message with 3-2 appointment

## 2014-04-03 ENCOUNTER — Ambulatory Visit: Payer: Medicaid Other

## 2014-04-03 ENCOUNTER — Encounter: Payer: Self-pay | Admitting: Hematology & Oncology

## 2014-04-03 ENCOUNTER — Other Ambulatory Visit (HOSPITAL_BASED_OUTPATIENT_CLINIC_OR_DEPARTMENT_OTHER): Payer: Medicaid Other | Admitting: Lab

## 2014-04-03 ENCOUNTER — Ambulatory Visit (HOSPITAL_BASED_OUTPATIENT_CLINIC_OR_DEPARTMENT_OTHER): Payer: Medicaid Other | Admitting: Hematology & Oncology

## 2014-04-03 VITALS — BP 129/79 | HR 101 | Temp 97.6°F | Resp 18 | Ht <= 58 in | Wt 129.0 lb

## 2014-04-03 DIAGNOSIS — D709 Neutropenia, unspecified: Secondary | ICD-10-CM

## 2014-04-03 DIAGNOSIS — D649 Anemia, unspecified: Secondary | ICD-10-CM

## 2014-04-03 DIAGNOSIS — G809 Cerebral palsy, unspecified: Secondary | ICD-10-CM

## 2014-04-03 DIAGNOSIS — D61818 Other pancytopenia: Secondary | ICD-10-CM

## 2014-04-03 LAB — CBC WITH DIFFERENTIAL (CANCER CENTER ONLY)
BASO#: 0.1 10*3/uL (ref 0.0–0.2)
BASO%: 0.9 % (ref 0.0–2.0)
EOS%: 2.8 % (ref 0.0–7.0)
Eosinophils Absolute: 0.2 10*3/uL (ref 0.0–0.5)
HCT: 39.1 % (ref 38.7–49.9)
HEMOGLOBIN: 13 g/dL (ref 13.0–17.1)
LYMPH#: 1.3 10*3/uL (ref 0.9–3.3)
LYMPH%: 23.5 % (ref 14.0–48.0)
MCH: 30.1 pg (ref 28.0–33.4)
MCHC: 33.2 g/dL (ref 32.0–35.9)
MCV: 91 fL (ref 82–98)
MONO#: 1.2 10*3/uL — AB (ref 0.1–0.9)
MONO%: 22.7 % — ABNORMAL HIGH (ref 0.0–13.0)
NEUT#: 2.7 10*3/uL (ref 1.5–6.5)
NEUT%: 50.1 % (ref 40.0–80.0)
Platelets: 112 10*3/uL — ABNORMAL LOW (ref 145–400)
RBC: 4.32 10*6/uL (ref 4.20–5.70)
RDW: 16.3 % — ABNORMAL HIGH (ref 11.1–15.7)
WBC: 5.3 10*3/uL (ref 4.0–10.0)

## 2014-04-03 LAB — CHCC SATELLITE - SMEAR

## 2014-04-04 LAB — CULTURE, BLOOD (ROUTINE X 2)
CULTURE: NO GROWTH
Culture: NO GROWTH

## 2014-04-04 NOTE — Progress Notes (Signed)
Hematology and Oncology Follow Up Visit  Jeffery [name] [id number] 04-16-1990 24 y.o. 04/04/2014   Principle Diagnosis:   Acquired pancytopenia-possible medication related  Current Therapy:    Observation     Interim History:  Mr.  Jeffery [name] is back for his first office visit. I saw him in the hospital last week. He has a very, very complicated history. 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 He basically is bed bound. He does have a special wheelchair that he can get in. He cannot swallow. Has a feeding tube. He has a Port-A-Cath.  His adoptive parents are incredibly dedicated to him and to try to preserve his quality of life which is marginal at best.  He came in with febrile neutropenia. It was felt that maybe his anti-seizure medicines were the cause. He had been on Her for many years. This was discontinued. He also was on Vimpat which was discontinued.  His lab studies did show some iron deficiency. He was transfused I think with 4 units of blood. He did get a dose of IV iron.  He had negative cultures.  He did get some Neupogen in the hospital. His dose was 480 g.  He had some thrombocytopenia.  His blood smear really did not show anything that was obvious.  He had an erythropoietin level of 1100.  It was felt that he may need a bone marrow biopsy but given his incredibly difficult performance status and his body habitus, a bone marrow biopsy would be incredibly difficult.  He was discharged a couple days before we saw him.  At home, he's been doing okay. He's had no temperatures. He's had no seizures.  He sees quite a few doctors.  Medications:  Current outpatient prescriptions:  .  albuterol (PROVENTIL HFA;VENTOLIN HFA) 108 (90 BASE) MCG/ACT inhaler, Inhale 2 puffs into the lungs every 4 (four) hours as needed for shortness of breath., Disp: , Rfl:  .  albuterol (PROVENTIL) (2.5 MG/3ML) 0.083% nebulizer solution, Take 2.5 mg by  nebulization. 1 ampule twice daily and as needed (making 4 doses daily), Disp: , Rfl:  .  Ascorbic Acid (VITAMIN C) 500 MG/5ML LIQD, 300 mg by PEG Tube route at bedtime. , Disp: , Rfl:  .  baclofen (GABLOFEN) 40000 MCG/20ML SOLN, 385.2 mcg by Intrathecal route continuous. , Disp: , Rfl:  .  calcium carbonate, dosed in mg elemental calcium, 1250 MG/5ML, 1,250 mg of elemental calcium by PEG Tube route 3 (three) times daily. 5 cc via peg tube, Disp: , Rfl:  .  cloBAZam (ONFI) 2.5 MG/ML solution, Place 2 mLs (5 mg total) into feeding tube 2 (two) times daily., Disp: 120 mL, Rfl: 1 .  diazepam (DIASTAT ACUDIAL) 10 MG GEL, Place 12.5 mg rectally as needed for seizure. , Disp: , Rfl:  .  dicyclomine (BENTYL) 10 MG/5ML syrup, Place into feeding tube every 8 (eight) hours as needed. Not to exceed 5 doses in 1 wk, Disp: , Rfl:  .  diphenoxylate-atropine (LOMOTIL) 2.5-0.025 MG/5ML liquid, Place into feeding tube 4 (four) times daily as needed for diarrhea or loose stools. 1-2 tsp via feeding tube q6h prn loose stools, Disp: , Rfl:  .  docusate (COLACE) 50 MG/5ML liquid, Take 50 mg by mouth daily as needed for mild constipation., Disp: , Rfl:  .  folic acid (FOLVITE) 1 MG tablet, Take 1 mg by mouth daily., Disp: , Rfl:  .  furosemide (LASIX) 10 MG/ML solution, Place 20 mg  into feeding tube daily as needed for fluid. , Disp: , Rfl:  .  gabapentin (NEURONTIN) 250 MG/5ML solution, Take 18 mL 3 times daily (Patient taking differently: Place 825 mg into feeding tube 3 (three) times daily. Take 18 mL 3 times daily), Disp: 1620 mL, Rfl: 5 .  ketoconazole (NIZORAL) 2 % cream, Apply 1 application topically 2 (two) times daily as needed for irritation., Disp: , Rfl:  .  lansoprazole (PREVACID SOLUTAB) 30 MG disintegrating tablet, Take 30 mg by mouth 2 (two) times daily. 730am, Disp: , Rfl:  .  miconazole (MICOTIN) 2 % cream, Apply topically 2 (two) times daily., Disp: 28.35 g, Rfl: 1 .  midazolam (VERSED) 5 MG/ML  injection, Place 2 mLs (10 mg total) into the nose once. Draw up 30m in 2 syringes. Remove blue vial access device. Attach syringe to nasal atomizer for intranasal administration. Give 164min right nostril x 2 for seizures lasting 2 minutes or longer or for repetitive seizures in a short period of time., Disp: 6 mL, Rfl: 3 .  Multiple Vitamins-Minerals (ZINC PO), 5 mLs by PEG Tube route daily. 244 mg / 5 mL zinc solution, Disp: , Rfl:  .  mupirocin ointment (BACTROBAN) 2 %, Apply 1 application topically as needed (to G-T site)., Disp: 22 g, Rfl: 1 .  Nutritional Supplements (PROMOD PO), Take 30 mLs by mouth 2 (two) times daily. Noon and 10pm, Disp: , Rfl:  .  Nutritional Supplements (TWOCAL HN) LIQD, Take 237 mLs by mouth. T.3 can at 46 cc hour x 12 hours - water 172 cc pre and post each and extra 172 bid., Disp: , Rfl:  .  nystatin cream (MYCOSTATIN), Apply 1,7,062,376pplication topically. 3-4 times daily, Disp: , Rfl:  .  OVER THE COUNTER MEDICATION, Apply 1 application topically as needed (after diaper changes). Balmex and Bag Balm paste made and applied after diaper changes, Disp: , Rfl:  .  OXYGEN-HELIUM IN, Inhale into the lungs. Oxygen PRN to keep O2 Sat at 90%, Disp: , Rfl:  .  polyethylene glycol (MIRALAX / GLYCOLAX) packet, Take 17 g by mouth daily., Disp: , Rfl:  .  Pseudoephedrine HCl (SUDAFED CHILDRENS) 15 MG/5ML LIQD, Give 90 mg by tube every 6 (six) hours. , Disp: , Rfl:  .  simethicone (MYLICON) 12283G chewable tablet, Chew 125 mg by mouth every 6 (six) hours as needed for flatulence., Disp: , Rfl:  .  sodium phosphate (FLEET) enema, Place 1 enema rectally. follow package directions PRN, Disp: , Rfl:  .  sucralfate (CARAFATE) 1 GM/10ML suspension, Place 1 g into feeding tube 4 (four) times daily. 7am, 130pm, 5pm, 8pm, Disp: , Rfl:  .  Water For Irrigation, Sterile (FREE WATER) SOLN, Place 172 mLs into feeding tube 4 (four) times daily., Disp: , Rfl:  .  zinc oxide (BALMEX) 11.3 % CREA  cream, Apply 1 application topically 5 (five) times daily. With each diaper change., Disp: , Rfl:   Current facility-administered medications:  .  botulinum toxin Type A (BOTOX) injection 300 Units, 300 Units, Intramuscular, Once, YiMarcial PacasMD  Allergies:  Allergies  Allergen Reactions  . Adhesive [Tape]     Rips skin off  . Depakote [Divalproex Sodium]     Causes pancreatitis   . Amoxicillin-Pot Clavulanate Rash    Past Medical History, Surgical history, Social history, and Family History were reviewed and updated.  Review of Systems: As above  Physical Exam:  height is 4' 2"  (1.1.51) and weight  is 129 lb (58.514 kg). His oral temperature is 97.6 F (36.4 C). His blood pressure is 129/79 and his pulse is 101. His respiration is 18.   Debilitated young male. He has obvious changes consistent with cerebral palsy. He has no obvious oral lesions. He has no adenopathy. Lungs are clear. Cardiac exam regular rate and rhythm with no murmurs, rubs or bruits. Abdomen is soft. He is slightly obese. He has no obvious hepatosplenomegaly. Extremities shows joint changes this isn't with his cerebral palsy. He does have a implantable baclofen pump in the right lower quadrant of his abdomen. Skin exam shows no obvious rashes. There is no ecchymoses. Neurological exam shows the spasticity and dysarthria from his cerebral palsy  Lab Results  Component Value Date   WBC 5.3 04/03/2014   HGB 13.0 04/03/2014   HCT 39.1 04/03/2014   MCV 91 04/03/2014   PLT 112 Platelet count confirmed by slide estimate* 04/03/2014     Chemistry      Component Value Date/Time   NA 145 03/31/2014 0350   K 3.3* 03/31/2014 0350   CL 107 03/31/2014 0350   CO2 29 03/31/2014 0350   BUN 9 03/31/2014 0350   CREATININE 0.61 03/31/2014 0350      Component Value Date/Time   CALCIUM 8.9 03/31/2014 0350   ALKPHOS 98 03/29/2014 0735   AST 19 03/29/2014 0735   ALT 29 03/29/2014 0735   BILITOT 0.9 03/29/2014 0735      Normochromic and normocytic red blood cells. I see no nucleated red cells. There is no teardrop cells. There is no rouleau formation. There is no inclusion bodies. White cells. Normal in morphology and maturation. There are no hypersegmented polys. There is no immature myeloid or lymphoid forms. Platelets are mildly decreased in number.    Impression and Plan: Mr. Vallee is 24 year old male. He had pancytopenia and febrile neutropenia.  It's hard to say what is the source. His medications would be most likely. However, I would not think that his blood counts would be this good after just discontinuing his medicines 2 or 3 days ago.  It's possible he may have had some kind of viral syndrome.  An underlying bone marrow disorder is always possible. He certainly is at a high risk for leukemia. However, the blood smear certainly was not too indicative of any obvious hematologic malignancy.  We will follow him up in 2 days. He does not need any Neupogen today. He would not benefit from any ESA because his erythropoietin level so high.  I spent about 45 minutes with him. Again, his adoptive parents are incredibly dedicated to him and they will be blessed eternally once they get to heaven.   Volanda Napoleon, MD 3/3/20166:00 PM

## 2014-04-05 ENCOUNTER — Ambulatory Visit (HOSPITAL_BASED_OUTPATIENT_CLINIC_OR_DEPARTMENT_OTHER): Payer: Medicaid Other | Admitting: Hematology & Oncology

## 2014-04-05 ENCOUNTER — Ambulatory Visit (HOSPITAL_COMMUNITY): Payer: Medicaid Other | Admitting: Hematology & Oncology

## 2014-04-05 ENCOUNTER — Ambulatory Visit (HOSPITAL_BASED_OUTPATIENT_CLINIC_OR_DEPARTMENT_OTHER): Payer: Medicaid Other

## 2014-04-05 ENCOUNTER — Other Ambulatory Visit (HOSPITAL_BASED_OUTPATIENT_CLINIC_OR_DEPARTMENT_OTHER): Payer: Medicaid Other | Admitting: Lab

## 2014-04-05 DIAGNOSIS — D709 Neutropenia, unspecified: Secondary | ICD-10-CM

## 2014-04-05 DIAGNOSIS — D61818 Other pancytopenia: Secondary | ICD-10-CM

## 2014-04-05 DIAGNOSIS — G809 Cerebral palsy, unspecified: Secondary | ICD-10-CM

## 2014-04-05 DIAGNOSIS — R5081 Fever presenting with conditions classified elsewhere: Principal | ICD-10-CM

## 2014-04-05 DIAGNOSIS — D72819 Decreased white blood cell count, unspecified: Secondary | ICD-10-CM

## 2014-04-05 DIAGNOSIS — D619 Aplastic anemia, unspecified: Secondary | ICD-10-CM

## 2014-04-05 DIAGNOSIS — R7989 Other specified abnormal findings of blood chemistry: Secondary | ICD-10-CM

## 2014-04-05 LAB — CBC WITH DIFFERENTIAL (CANCER CENTER ONLY)
BASO#: 0 10*3/uL (ref 0.0–0.2)
BASO%: 0.8 % (ref 0.0–2.0)
EOS%: 3.1 % (ref 0.0–7.0)
Eosinophils Absolute: 0.1 10*3/uL (ref 0.0–0.5)
HEMATOCRIT: 41.9 % (ref 38.7–49.9)
HEMOGLOBIN: 13.5 g/dL (ref 13.0–17.1)
LYMPH#: 0.7 10*3/uL — ABNORMAL LOW (ref 0.9–3.3)
LYMPH%: 19 % (ref 14.0–48.0)
MCH: 29.9 pg (ref 28.0–33.4)
MCHC: 32.2 g/dL (ref 32.0–35.9)
MCV: 93 fL (ref 82–98)
MONO#: 0.8 10*3/uL (ref 0.1–0.9)
MONO%: 21.8 % — AB (ref 0.0–13.0)
NEUT#: 2 10*3/uL (ref 1.5–6.5)
NEUT%: 55.3 % (ref 40.0–80.0)
Platelets: 93 10*3/uL — ABNORMAL LOW (ref 145–400)
RBC: 4.52 10*6/uL (ref 4.20–5.70)
RDW: 16.4 % — ABNORMAL HIGH (ref 11.1–15.7)
WBC: 3.5 10*3/uL — ABNORMAL LOW (ref 4.0–10.0)

## 2014-04-05 LAB — CHCC SATELLITE - SMEAR

## 2014-04-05 MED ORDER — FILGRASTIM 300 MCG/0.5ML IJ SOSY
300.0000 ug | PREFILLED_SYRINGE | Freq: Once | INTRAMUSCULAR | Status: AC
  Administered 2014-04-05: 300 ug via SUBCUTANEOUS
  Filled 2014-04-05: qty 0.5

## 2014-04-05 NOTE — Progress Notes (Signed)
Hematology and Oncology Follow Up Visit  Shivaay Stormont [id number] January 21, 1991 24 y.o. 04/05/2014   Principle Diagnosis:   Acquired pancytopenia-possible medication related  Current Therapy:    Observation     Interim History:  Mr.  Claggett is back for his second office visit. I saw him in the hospital last week. He has a very, very complicated history. 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 He basically is bed bound. He does have a special wheelchair that he can get in. He cannot swallow. Has a feeding tube. He has a Port-A-Cath.  His adoptive parents are incredibly dedicated to him and to try to preserve his quality of life which is marginal at best.  We do not have to give him Neupogen was saw him a couple days ago.  He's been doing okay at home. He is off the Keppra and the Vimpat. He's not had any seizures at home.  He is getting tube feeds. He is doing well with the tube feeds.  He's had no rashes. He's had no fever. He's had some diarrhea but this is chronic.  He's had no obvious shortness of breath. He's had no cough. There's been no pain.      Medications:  Current outpatient prescriptions:  .  albuterol (PROVENTIL HFA;VENTOLIN HFA) 108 (90 BASE) MCG/ACT inhaler, Inhale 2 puffs into the lungs every 4 (four) hours as needed for shortness of breath., Disp: , Rfl:  .  albuterol (PROVENTIL) (2.5 MG/3ML) 0.083% nebulizer solution, Take 2.5 mg by nebulization. 1 ampule twice daily and as needed (making 4 doses daily), Disp: , Rfl:  .  Ascorbic Acid (VITAMIN C) 500 MG/5ML LIQD, 300 mg by PEG Tube route at bedtime. , Disp: , Rfl:  .  baclofen (GABLOFEN) 40000 MCG/20ML SOLN, 385.2 mcg by Intrathecal route continuous. , Disp: , Rfl:  .  calcium carbonate, dosed in mg elemental calcium, 1250 MG/5ML, 1,250 mg of elemental calcium by PEG Tube route 3 (three) times daily. 5 cc via peg tube, Disp: , Rfl:  .  cloBAZam (ONFI) 2.5 MG/ML solution, Place 2 mLs  (5 mg total) into feeding tube 2 (two) times daily., Disp: 120 mL, Rfl: 1 .  diazepam (DIASTAT ACUDIAL) 10 MG GEL, Place 12.5 mg rectally as needed for seizure. , Disp: , Rfl:  .  dicyclomine (BENTYL) 10 MG/5ML syrup, Place into feeding tube every 8 (eight) hours as needed. Not to exceed 5 doses in 1 wk, Disp: , Rfl:  .  diphenoxylate-atropine (LOMOTIL) 2.5-0.025 MG/5ML liquid, Place into feeding tube 4 (four) times daily as needed for diarrhea or loose stools. 1-2 tsp via feeding tube q6h prn loose stools, Disp: , Rfl:  .  docusate (COLACE) 50 MG/5ML liquid, Take 50 mg by mouth daily as needed for mild constipation., Disp: , Rfl:  .  folic acid (FOLVITE) 1 MG tablet, Take 1 mg by mouth daily., Disp: , Rfl:  .  furosemide (LASIX) 10 MG/ML solution, Place 20 mg into feeding tube daily as needed for fluid. , Disp: , Rfl:  .  gabapentin (NEURONTIN) 250 MG/5ML solution, Take 18 mL 3 times daily (Patient taking differently: Place 825 mg into feeding tube 3 (three) times daily. Take 18 mL 3 times daily), Disp: 1620 mL, Rfl: 5 .  ketoconazole (NIZORAL) 2 % cream, Apply 1 application topically 2 (two) times daily as needed for irritation., Disp: , Rfl:  .  lansoprazole (PREVACID SOLUTAB) 30 MG disintegrating tablet, Take  30 mg by mouth 2 (two) times daily. 730am, Disp: , Rfl:  .  miconazole (MICOTIN) 2 % cream, Apply topically 2 (two) times daily., Disp: 28.35 g, Rfl: 1 .  midazolam (VERSED) 5 MG/ML injection, Place 2 mLs (10 mg total) into the nose once. Draw up 14ml in 2 syringes. Remove blue vial access device. Attach syringe to nasal atomizer for intranasal administration. Give 54ml in right nostril x 2 for seizures lasting 2 minutes or longer or for repetitive seizures in a short period of time., Disp: 6 mL, Rfl: 3 .  Multiple Vitamins-Minerals (ZINC PO), 5 mLs by PEG Tube route daily. 244 mg / 5 mL zinc solution, Disp: , Rfl:  .  mupirocin ointment (BACTROBAN) 2 %, Apply 1 application topically as needed  (to G-T site)., Disp: 22 g, Rfl: 1 .  Nutritional Supplements (PROMOD PO), Take 30 mLs by mouth 2 (two) times daily. Noon and 10pm, Disp: , Rfl:  .  Nutritional Supplements (TWOCAL HN) LIQD, Take 237 mLs by mouth. T.3 can at 46 cc hour x 12 hours - water 172 cc pre and post each and extra 172 bid., Disp: , Rfl:  .  nystatin cream (MYCOSTATIN), Apply 3,220,254 application topically. 3-4 times daily, Disp: , Rfl:  .  OVER THE COUNTER MEDICATION, Apply 1 application topically as needed (after diaper changes). Balmex and Bag Balm paste made and applied after diaper changes, Disp: , Rfl:  .  OXYGEN-HELIUM IN, Inhale into the lungs. Oxygen PRN to keep O2 Sat at 90%, Disp: , Rfl:  .  polyethylene glycol (MIRALAX / GLYCOLAX) packet, Take 17 g by mouth daily., Disp: , Rfl:  .  Pseudoephedrine HCl (SUDAFED CHILDRENS) 15 MG/5ML LIQD, Give 90 mg by tube every 6 (six) hours. , Disp: , Rfl:  .  simethicone (MYLICON) 270 MG chewable tablet, Chew 125 mg by mouth every 6 (six) hours as needed for flatulence., Disp: , Rfl:  .  sodium phosphate (FLEET) enema, Place 1 enema rectally. follow package directions PRN, Disp: , Rfl:  .  sucralfate (CARAFATE) 1 GM/10ML suspension, Place 1 g into feeding tube 4 (four) times daily. 7am, 130pm, 5pm, 8pm, Disp: , Rfl:  .  Water For Irrigation, Sterile (FREE WATER) SOLN, Place 172 mLs into feeding tube 4 (four) times daily., Disp: , Rfl:  .  zinc oxide (BALMEX) 11.3 % CREA cream, Apply 1 application topically 5 (five) times daily. With each diaper change., Disp: , Rfl:   Current facility-administered medications:  .  botulinum toxin Type A (BOTOX) injection 300 Units, 300 Units, Intramuscular, Once, Marcial Pacas, MD  Allergies:  Allergies  Allergen Reactions  . Adhesive [Tape]     Rips skin off  . Depakote [Divalproex Sodium]     Causes pancreatitis   . Amoxicillin-Pot Clavulanate Rash    Past Medical History, Surgical history, Social history, and Family History were  reviewed and updated.  Review of Systems: As above  Physical Exam:  vitals were not taken for this visit.  Debilitated young male. He has obvious changes consistent with cerebral palsy. He has no obvious oral lesions. He has no adenopathy. Lungs are clear. Cardiac exam regular rate and rhythm with no murmurs, rubs or bruits. Abdomen is soft. He is slightly obese. He has no obvious hepatosplenomegaly. Extremities shows joint changes this isn't with his cerebral palsy. He does have a implantable baclofen pump in the right lower quadrant of his abdomen. Skin exam shows no obvious rashes. There is no ecchymoses.  Neurological exam shows the spasticity and dysarthria from his cerebral palsy  Lab Results  Component Value Date   WBC 3.5* 04/05/2014   HGB 13.5 04/05/2014   HCT 41.9 04/05/2014   MCV 93 04/05/2014   PLT 93* 04/05/2014     Chemistry      Component Value Date/Time   NA 145 03/31/2014 0350   K 3.3* 03/31/2014 0350   CL 107 03/31/2014 0350   CO2 29 03/31/2014 0350   BUN 9 03/31/2014 0350   CREATININE 0.61 03/31/2014 0350      Component Value Date/Time   CALCIUM 8.9 03/31/2014 0350   ALKPHOS 98 03/29/2014 0735   AST 19 03/29/2014 0735   ALT 29 03/29/2014 0735   BILITOT 0.9 03/29/2014 0735     Normochromic and normocytic red blood cells. I see no nucleated red cells. There is no teardrop cells. There is no rouleau formation. There is no inclusion bodies. White cells. Normal in morphology and maturation. There are no hypersegmented polys. There is no immature myeloid or lymphoid forms. Platelets are mildly decreased in number.    Impression and Plan: Mr. Aquilino is 24 year old male. He had pancytopenia and febrile neutropenia.  It's hard to say what is the source. His medications would be most likely. However, I would not think that his blood counts would be this good after just discontinuing his medicines 2 or 3 days ago.  It's possible he may have had some kind of viral  syndrome.  An underlying bone marrow disorder is always possible. He certainly is at a high risk for leukemia. However, the blood smear certainly was not too indicative of any obvious hematologic malignancy.  With his CBC and white cell count down a little bit, I will go ahead and give him Neupogen. I want to be aggressive and try to minimize any symptomatic neutropenia that he may have. He is certainly at high risk for significant decompensation if he becomes septic.  I want him back in one week. We will recheck a CBC.  Volanda Napoleon, MD 3/4/20161:27 PM

## 2014-04-05 NOTE — Patient Instructions (Signed)
Filgrastim, G-CSF injection  What is this medicine?  FILGRASTIM, G-CSF (fil GRA stim) is a granulocyte colony-stimulating factor that stimulates the growth of neutrophils, a type of white blood cell (WBC) important in the body's fight against infection. It is used to reduce the incidence of fever and infection in patients with certain types of cancer who are receiving chemotherapy that affects the bone marrow, to stimulate blood cell production for removal of WBCs from the body prior to a bone marrow transplantation, to reduce the incidence of fever and infection in patients who have severe chronic neutropenia, and to improve survival outcomes following high-dose radiation exposure that is toxic to the bone marrow.  This medicine may be used for other purposes; ask your health care provider or pharmacist if you have questions.  COMMON BRAND NAME(S): Neupogen  What should I tell my health care provider before I take this medicine?  They need to know if you have any of these conditions:  -latex allergy  -ongoing radiation therapy  -sickle cell disease  -an unusual or allergic reaction to filgrastim, pegfilgrastim, other medicines, foods, dyes, or preservatives  -pregnant or trying to get pregnant  -breast-feeding  How should I use this medicine?  This medicine is for injection under the skin. If you get this medicine at home, you will be taught how to prepare and give this medicine. Refer to the Instructions for Use that come with your medication packaging. Use exactly as directed. Take your medicine at regular intervals. Do not take your medicine more often than directed.  It is important that you put your used needles and syringes in a special sharps container. Do not put them in a trash can. If you do not have a sharps container, call your pharmacist or healthcare provider to get one.  Talk to your pediatrician regarding the use of this medicine in children. While this drug may be prescribed for children as young  as 7 months for selected conditions, precautions do apply.  Overdosage: If you think you have taken too much of this medicine contact a poison control center or emergency room at once.  NOTE: This medicine is only for you. Do not share this medicine with others.  What if I miss a dose?  It is important not to miss your dose. Call your doctor or health care professional if you miss a dose.  What may interact with this medicine?  This medicine may interact with the following medications:  -medicines that may cause a release of neutrophils, such as lithium  This list may not describe all possible interactions. Give your health care provider a list of all the medicines, herbs, non-prescription drugs, or dietary supplements you use. Also tell them if you smoke, drink alcohol, or use illegal drugs. Some items may interact with your medicine.  What should I watch for while using this medicine?  You may need blood work done while you are taking this medicine.  What side effects may I notice from receiving this medicine?  Side effects that you should report to your doctor or health care professional as soon as possible:  -allergic reactions like skin rash, itching or hives, swelling of the face, lips, or tongue  -dizziness or feeling faint  -fever  -pain, redness, or irritation at site where injected  -pinpoint red spots on the skin  -shortness of breath or breathing problems  -stomach or side pain, or pain at the shoulder  -swelling  -tiredness  -trouble passing urine  -unusual   bleeding or bruising  Side effects that usually do not require medical attention (report to your doctor or health care professional if they continue or are bothersome):  -bone pain  -headache  -muscle pain  This list may not describe all possible side effects. Call your doctor for medical advice about side effects. You may report side effects to FDA at 1-800-FDA-1088.  Where should I keep my medicine?  Keep out of the reach of children.  Store in a  refrigerator between 2 and 8 degrees C (36 and 46 degrees F). Do not freeze. Keep in carton to protect from light. Throw away this medicine if vials or syringes are left out of the refrigerator for more than 24 hours. Throw away any unused medicine after the expiration date.  NOTE: This sheet is a summary. It may not cover all possible information. If you have questions about this medicine, talk to your doctor, pharmacist, or health care provider.   2015, [employer]. (2013-05-04 17:00:01)

## 2014-04-11 ENCOUNTER — Other Ambulatory Visit (HOSPITAL_BASED_OUTPATIENT_CLINIC_OR_DEPARTMENT_OTHER): Payer: Medicaid Other | Admitting: Lab

## 2014-04-11 ENCOUNTER — Ambulatory Visit (HOSPITAL_BASED_OUTPATIENT_CLINIC_OR_DEPARTMENT_OTHER): Payer: Medicaid Other

## 2014-04-11 ENCOUNTER — Ambulatory Visit (HOSPITAL_BASED_OUTPATIENT_CLINIC_OR_DEPARTMENT_OTHER): Payer: Medicaid Other | Admitting: Hematology & Oncology

## 2014-04-11 VITALS — BP 112/75 | HR 120 | Temp 98.1°F | Resp 22

## 2014-04-11 DIAGNOSIS — D61818 Other pancytopenia: Secondary | ICD-10-CM

## 2014-04-11 DIAGNOSIS — G808 Other cerebral palsy: Secondary | ICD-10-CM

## 2014-04-11 DIAGNOSIS — R5081 Fever presenting with conditions classified elsewhere: Secondary | ICD-10-CM

## 2014-04-11 DIAGNOSIS — D709 Neutropenia, unspecified: Secondary | ICD-10-CM

## 2014-04-11 DIAGNOSIS — G809 Cerebral palsy, unspecified: Secondary | ICD-10-CM

## 2014-04-11 LAB — CMP (CANCER CENTER ONLY)
ALBUMIN: 3.6 g/dL (ref 3.3–5.5)
ALK PHOS: 140 U/L — AB (ref 26–84)
ALT(SGPT): 71 U/L — ABNORMAL HIGH (ref 10–47)
AST: 41 U/L — ABNORMAL HIGH (ref 11–38)
BUN: 19 mg/dL (ref 7–22)
CHLORIDE: 101 meq/L (ref 98–108)
CO2: 32 meq/L (ref 18–33)
Calcium: 10 mg/dL (ref 8.0–10.3)
Creat: 0.5 mg/dl — ABNORMAL LOW (ref 0.6–1.2)
GLUCOSE: 101 mg/dL (ref 73–118)
Potassium: 3.8 mEq/L (ref 3.3–4.7)
SODIUM: 146 meq/L — AB (ref 128–145)
Total Bilirubin: 1.2 mg/dl (ref 0.20–1.60)
Total Protein: 7.7 g/dL (ref 6.4–8.1)

## 2014-04-11 LAB — CBC WITH DIFFERENTIAL (CANCER CENTER ONLY)
BASO#: 0 10*3/uL (ref 0.0–0.2)
BASO%: 0.9 % (ref 0.0–2.0)
EOS%: 2.6 % (ref 0.0–7.0)
Eosinophils Absolute: 0.1 10*3/uL (ref 0.0–0.5)
HEMATOCRIT: 44.3 % (ref 38.7–49.9)
HGB: 14 g/dL (ref 13.0–17.1)
LYMPH#: 0.7 10*3/uL — ABNORMAL LOW (ref 0.9–3.3)
LYMPH%: 30.9 % (ref 14.0–48.0)
MCH: 29.6 pg (ref 28.0–33.4)
MCHC: 31.6 g/dL — ABNORMAL LOW (ref 32.0–35.9)
MCV: 94 fL (ref 82–98)
MONO#: 0.6 10*3/uL (ref 0.1–0.9)
MONO%: 24.3 % — ABNORMAL HIGH (ref 0.0–13.0)
NEUT%: 41.3 % (ref 40.0–80.0)
NEUTROS ABS: 1 10*3/uL — AB (ref 1.5–6.5)
Platelets: 146 10*3/uL (ref 145–400)
RBC: 4.73 10*6/uL (ref 4.20–5.70)
RDW: 18.1 % — ABNORMAL HIGH (ref 11.1–15.7)
WBC: 2.3 10*3/uL — AB (ref 4.0–10.0)

## 2014-04-11 LAB — CHCC SATELLITE - SMEAR

## 2014-04-11 LAB — TECHNOLOGIST REVIEW CHCC SATELLITE

## 2014-04-11 MED ORDER — SODIUM CHLORIDE 0.9 % IJ SOLN
10.0000 mL | INTRAMUSCULAR | Status: DC | PRN
  Administered 2014-04-11: 10 mL via INTRAVENOUS
  Filled 2014-04-11: qty 10

## 2014-04-11 MED ORDER — PEGFILGRASTIM INJECTION 6 MG/0.6ML ~~LOC~~
6.0000 mg | PREFILLED_SYRINGE | Freq: Once | SUBCUTANEOUS | Status: AC
  Administered 2014-04-11: 6 mg via SUBCUTANEOUS

## 2014-04-11 MED ORDER — HEPARIN SOD (PORK) LOCK FLUSH 100 UNIT/ML IV SOLN
500.0000 [IU] | Freq: Once | INTRAVENOUS | Status: AC
  Administered 2014-04-11: 500 [IU] via INTRAVENOUS
  Filled 2014-04-11: qty 5

## 2014-04-11 NOTE — Progress Notes (Signed)
Hematology and Oncology Follow Up Visit  Jeffery [name] [id number] 1990/09/06 23 y.o. 04/11/2014   Principle Diagnosis:   Acquired pancytopenia-possible medication related  Current Therapy:    Observation     Interim History:  Jeffery [name] is back for his third office visit. I saw him in the hospital a couple of weeks ago.Marland Kitchen He has a very, very complicated history. 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 He basically is bed bound. He does have a special wheelchair that he can get in. He cannot swallow. Has a feeding tube. He has a Port-A-Cath.  His mom says that he has had a great week so far. He's had no fever. He's been pretty active. He cannot talk but they know when he is feeling well and when he is not.  He's had no nausea vomiting. He's had no seizures. He had his seizure medications readjusted.  There's been no bleeding. He's had no obvious change in bowel or bladder habits.   He's had no obvious shortness of breath. He's had no cough. There's been no pain.  This probably is the best that I have seen him look since he has been out of the hospital.  He does have a baclofen pump which is helping with his spasms.  Medications:  Current outpatient prescriptions:  .  albuterol (PROVENTIL HFA;VENTOLIN HFA) 108 (90 BASE) MCG/ACT inhaler, Inhale 2 puffs into the lungs every 4 (four) hours as needed for shortness of breath., Disp: , Rfl:  .  albuterol (PROVENTIL) (2.5 MG/3ML) 0.083% nebulizer solution, Take 2.5 mg by nebulization. 1 ampule twice daily and as needed (making 4 doses daily), Disp: , Rfl:  .  Ascorbic Acid (VITAMIN C) 500 MG/5ML LIQD, 300 mg by PEG Tube route at bedtime. , Disp: , Rfl:  .  baclofen (GABLOFEN) 40000 MCG/20ML SOLN, 385.2 mcg by Intrathecal route continuous. , Disp: , Rfl:  .  calcium carbonate, dosed in mg elemental calcium, 1250 MG/5ML, 1,250 mg of elemental calcium by PEG Tube route 3 (three) times daily. 5 cc via peg tube,  Disp: , Rfl:  .  cloBAZam (ONFI) 2.5 MG/ML solution, Place 2 mLs (5 mg total) into feeding tube 2 (two) times daily., Disp: 120 mL, Rfl: 1 .  diazepam (DIASTAT ACUDIAL) 10 MG GEL, Place 12.5 mg rectally as needed for seizure. , Disp: , Rfl:  .  dicyclomine (BENTYL) 10 MG/5ML syrup, Place into feeding tube every 8 (eight) hours as needed. Not to exceed 5 doses in 1 wk, Disp: , Rfl:  .  diphenoxylate-atropine (LOMOTIL) 2.5-0.025 MG/5ML liquid, Place into feeding tube 4 (four) times daily as needed for diarrhea or loose stools. 1-2 tsp via feeding tube q6h prn loose stools, Disp: , Rfl:  .  docusate (COLACE) 50 MG/5ML liquid, Take 50 mg by mouth daily as needed for mild constipation., Disp: , Rfl:  .  folic acid (FOLVITE) 1 MG tablet, Take 1 mg by mouth daily., Disp: , Rfl:  .  furosemide (LASIX) 10 MG/ML solution, Place 20 mg into feeding tube daily as needed for fluid. , Disp: , Rfl:  .  gabapentin (NEURONTIN) 250 MG/5ML solution, Take 18 mL 3 times daily (Patient taking differently: Place 825 mg into feeding tube 3 (three) times daily. Take 18 mL 3 times daily), Disp: 1620 mL, Rfl: 5 .  ketoconazole (NIZORAL) 2 % cream, Apply 1 application topically 2 (two) times daily as needed for irritation., Disp: , Rfl:  .  lansoprazole (PREVACID SOLUTAB) 30 MG disintegrating tablet, Take 30 mg by mouth 2 (two) times daily. 730am, Disp: , Rfl:  .  miconazole (MICOTIN) 2 % cream, Apply topically 2 (two) times daily., Disp: 28.35 g, Rfl: 1 .  midazolam (VERSED) 5 MG/ML injection, Place 2 mLs (10 mg total) into the nose once. Draw up 44ml in 2 syringes. Remove blue vial access device. Attach syringe to nasal atomizer for intranasal administration. Give 92ml in right nostril x 2 for seizures lasting 2 minutes or longer or for repetitive seizures in a short period of time., Disp: 6 mL, Rfl: 3 .  Multiple Vitamins-Minerals (ZINC PO), 5 mLs by PEG Tube route daily. 244 mg / 5 mL zinc solution, Disp: , Rfl:  .  mupirocin  ointment (BACTROBAN) 2 %, Apply 1 application topically as needed (to G-T site)., Disp: 22 g, Rfl: 1 .  Nutritional Supplements (PROMOD PO), Take 30 mLs by mouth 2 (two) times daily. Noon and 10pm, Disp: , Rfl:  .  Nutritional Supplements (TWOCAL HN) LIQD, Take 237 mLs by mouth. T.3 can at 46 cc hour x 12 hours - water 172 cc pre and post each and extra 172 bid., Disp: , Rfl:  .  nystatin cream (MYCOSTATIN), Apply [id number] application topically. 3-4 times daily, Disp: , Rfl:  .  OVER THE COUNTER MEDICATION, Apply 1 application topically as needed (after diaper changes). Balmex and Bag Balm paste made and applied after diaper changes, Disp: , Rfl:  .  OXYGEN-HELIUM IN, Inhale into the lungs. Oxygen PRN to keep O2 Sat at 90%, Disp: , Rfl:  .  polyethylene glycol (MIRALAX / GLYCOLAX) packet, Take 17 g by mouth daily., Disp: , Rfl:  .  Pseudoephedrine HCl (SUDAFED CHILDRENS) 15 MG/5ML LIQD, Give 90 mg by tube every 6 (six) hours. , Disp: , Rfl:  .  simethicone (MYLICON) 540 MG chewable tablet, Chew 125 mg by mouth every 6 (six) hours as needed for flatulence., Disp: , Rfl:  .  sodium phosphate (FLEET) enema, Place 1 enema rectally. follow package directions PRN, Disp: , Rfl:  .  sucralfate (CARAFATE) 1 GM/10ML suspension, Place 1 g into feeding tube 4 (four) times daily. 7am, 130pm, 5pm, 8pm, Disp: , Rfl:  .  Water For Irrigation, Sterile (FREE WATER) SOLN, Place 172 mLs into feeding tube 4 (four) times daily., Disp: , Rfl:  .  zinc oxide (BALMEX) 11.3 % CREA cream, Apply 1 application topically 5 (five) times daily. With each diaper change., Disp: , Rfl:   Current facility-administered medications:  .  botulinum toxin Type A (BOTOX) injection 300 Units, 300 Units, Intramuscular, Once, Marcial Pacas, MD  Facility-Administered Medications Ordered in Other Visits:  .  sodium chloride 0.9 % injection 10 mL, 10 mL, Intravenous, PRN, Volanda Napoleon, MD, 10 mL at 04/11/14 1330  Allergies:  Allergies    Allergen Reactions  . Adhesive [Tape]     Rips skin off  . Depakote [Divalproex Sodium]     Causes pancreatitis   . Amoxicillin-Pot Clavulanate Rash    Past Medical History, Surgical history, Social history, and Family History were reviewed and updated.  Review of Systems: As above  Physical Exam:  oral temperature is 98.1 F (36.7 C). His blood pressure is 112/75 and his pulse is 120. His respiration is 22.   Debilitated young male. He has obvious changes consistent with cerebral palsy. He has no obvious oral lesions. He has no adenopathy. Lungs are clear. Cardiac exam regular rate and  rhythm with no murmurs, rubs or bruits. Abdomen is soft. He is slightly obese. He has no obvious hepatosplenomegaly. Extremities shows joint changes consistent  with his cerebral palsy. He does have an implantable baclofen pump in the right lower quadrant of his abdomen. Skin exam shows no obvious rashes. There is no ecchymoses or petechia. Neurological exam shows the spasticity and dysarthria from his cerebral palsy  Lab Results  Component Value Date   WBC 2.3* 04/11/2014   HGB 14.0 04/11/2014   HCT 44.3 04/11/2014   MCV 94 04/11/2014   PLT 146 Platelet count confirmed by slide estimate 04/11/2014     Chemistry      Component Value Date/Time   NA 146* 04/11/2014 1347   NA 145 03/31/2014 0350   K 3.8 04/11/2014 1347   K 3.3* 03/31/2014 0350   CL 101 04/11/2014 1347   CL 107 03/31/2014 0350   CO2 32 04/11/2014 1347   CO2 29 03/31/2014 0350   BUN 19 04/11/2014 1347   BUN 9 03/31/2014 0350   CREATININE 0.5* 04/11/2014 1347   CREATININE 0.61 03/31/2014 0350      Component Value Date/Time   CALCIUM 10.0 04/11/2014 1347   CALCIUM 8.9 03/31/2014 0350   ALKPHOS 140* 04/11/2014 1347   ALKPHOS 98 03/29/2014 0735   AST 41* 04/11/2014 1347   AST 19 03/29/2014 0735   ALT 71* 04/11/2014 1347   ALT 29 03/29/2014 0735   BILITOT 1.20 04/11/2014 1347   BILITOT 0.9 03/29/2014 0735      Normochromic and normocytic red blood cells. I see no nucleated red cells. There is no teardrop cells. There is no rouleau formation. There is no inclusion bodies. White cells. Normal in morphology and maturation. There are no hypersegmented polys. There is no immature myeloid or lymphoid forms. Platelets are mildly decreased in number.    Impression and Plan: Mr. Fester is 24 year old male. He had pancytopenia and febrile neutropenia.  It's still hard to say what is the source. His medications would be most likely. However, I would not think that his blood counts would be this good after just discontinuing his medicines about a week ago.  It's possible he may have had some kind of viral syndrome.  An underlying bone marrow disorder is always possible. He certainly is at a high risk for leukemia. However, the blood smear certainly was not too indicative of any obvious hematologic malignancy.  With his CBC and white cell count down a little bit, I will go ahead and give him Neulasta. I want to be aggressive and try to minimize any symptomatic neutropenia that he may have. He is certainly at high risk for significant decompensation if he becomes septic.  I think with Neulasta, we can hopefully have him not come back for another couple weeks.  I want him back in 2 weeks.. We will recheck a CBC.  Volanda Napoleon, MD 3/10/20165:00 PM

## 2014-04-11 NOTE — Patient Instructions (Signed)
Pegfilgrastim injection What is this medicine? PEGFILGRASTIM (peg fil GRA stim) is a long-acting granulocyte colony-stimulating factor that stimulates the growth of neutrophils, a type of white blood cell important in the body's fight against infection. It is used to reduce the incidence of fever and infection in patients with certain types of cancer who are receiving chemotherapy that affects the bone marrow. This medicine may be used for other purposes; ask your health care provider or pharmacist if you have questions. COMMON BRAND NAME(S): Neulasta What should I tell my health care provider before I take this medicine? They need to know if you have any of these conditions: -latex allergy -ongoing radiation therapy -sickle cell disease -skin reactions to acrylic adhesives (On-Body Injector only) -an unusual or allergic reaction to pegfilgrastim, filgrastim, other medicines, foods, dyes, or preservatives -pregnant or trying to get pregnant -breast-feeding How should I use this medicine? This medicine is for injection under the skin. If you get this medicine at home, you will be taught how to prepare and give the pre-filled syringe or how to use the On-body Injector. Refer to the patient Instructions for Use for detailed instructions. Use exactly as directed. Take your medicine at regular intervals. Do not take your medicine more often than directed. It is important that you put your used needles and syringes in a special sharps container. Do not put them in a trash can. If you do not have a sharps container, call your pharmacist or healthcare provider to get one. Talk to your pediatrician regarding the use of this medicine in children. Special care may be needed. Overdosage: If you think you have taken too much of this medicine contact a poison control center or emergency room at once. NOTE: This medicine is only for you. Do not share this medicine with others. What if I miss a dose? It is  important not to miss your dose. Call your doctor or health care professional if you miss your dose. If you miss a dose due to an On-body Injector failure or leakage, a new dose should be administered as soon as possible using a single prefilled syringe for manual use. What may interact with this medicine? Interactions have not been studied. Give your health care provider a list of all the medicines, herbs, non-prescription drugs, or dietary supplements you use. Also tell them if you smoke, drink alcohol, or use illegal drugs. Some items may interact with your medicine. This list may not describe all possible interactions. Give your health care provider a list of all the medicines, herbs, non-prescription drugs, or dietary supplements you use. Also tell them if you smoke, drink alcohol, or use illegal drugs. Some items may interact with your medicine. What should I watch for while using this medicine? You may need blood work done while you are taking this medicine. If you are going to need a MRI, CT scan, or other procedure, tell your doctor that you are using this medicine (On-Body Injector only). What side effects may I notice from receiving this medicine? Side effects that you should report to your doctor or health care professional as soon as possible: -allergic reactions like skin rash, itching or hives, swelling of the face, lips, or tongue -dizziness -fever -pain, redness, or irritation at site where injected -pinpoint red spots on the skin -shortness of breath or breathing problems -stomach or side pain, or pain at the shoulder -swelling -tiredness -trouble passing urine Side effects that usually do not require medical attention (report to your doctor   or health care professional if they continue or are bothersome): -bone pain -muscle pain This list may not describe all possible side effects. Call your doctor for medical advice about side effects. You may report side effects to FDA at  1-800-FDA-1088. Where should I keep my medicine? Keep out of the reach of children. Store pre-filled syringes in a refrigerator between 2 and 8 degrees C (36 and 46 degrees F). Do not freeze. Keep in carton to protect from light. Throw away this medicine if it is left out of the refrigerator for more than 48 hours. Throw away any unused medicine after the expiration date. NOTE: This sheet is a summary. It may not cover all possible information. If you have questions about this medicine, talk to your doctor, pharmacist, or health care provider.  2015, [employer]vier/Gold Standard. (2013-04-19 16:14:05)  

## 2014-04-12 ENCOUNTER — Inpatient Hospital Stay (HOSPITAL_COMMUNITY)
Admission: EM | Admit: 2014-04-12 | Discharge: 2014-04-14 | DRG: 917 | Disposition: A | Discharge status: Home / Self Care | Payer: Medicaid Other | Attending: Internal Medicine | Admitting: Internal Medicine

## 2014-04-12 ENCOUNTER — Encounter (HOSPITAL_COMMUNITY): Payer: Self-pay | Admitting: Emergency Medicine

## 2014-04-12 ENCOUNTER — Emergency Department (HOSPITAL_COMMUNITY): Payer: Medicaid Other

## 2014-04-12 DIAGNOSIS — G9340 Encephalopathy, unspecified: Secondary | ICD-10-CM | POA: Diagnosis present

## 2014-04-12 DIAGNOSIS — Z931 Gastrostomy status: Secondary | ICD-10-CM

## 2014-04-12 DIAGNOSIS — M810 Age-related osteoporosis without current pathological fracture: Secondary | ICD-10-CM | POA: Diagnosis present

## 2014-04-12 DIAGNOSIS — R079 Chest pain, unspecified: Secondary | ICD-10-CM

## 2014-04-12 DIAGNOSIS — T458X5A Adverse effect of other primarily systemic and hematological agents, initial encounter: Principal | ICD-10-CM | POA: Diagnosis present

## 2014-04-12 DIAGNOSIS — G40309 Generalized idiopathic epilepsy and epileptic syndromes, not intractable, without status epilepticus: Secondary | ICD-10-CM

## 2014-04-12 DIAGNOSIS — Z993 Dependence on wheelchair: Secondary | ICD-10-CM

## 2014-04-12 DIAGNOSIS — E876 Hypokalemia: Secondary | ICD-10-CM | POA: Diagnosis not present

## 2014-04-12 DIAGNOSIS — G40209 Localization-related (focal) (partial) symptomatic epilepsy and epileptic syndromes with complex partial seizures, not intractable, without status epilepticus: Secondary | ICD-10-CM | POA: Diagnosis present

## 2014-04-12 DIAGNOSIS — E162 Hypoglycemia, unspecified: Secondary | ICD-10-CM | POA: Diagnosis not present

## 2014-04-12 DIAGNOSIS — Z95828 Presence of other vascular implants and grafts: Secondary | ICD-10-CM

## 2014-04-12 DIAGNOSIS — R4182 Altered mental status, unspecified: Secondary | ICD-10-CM

## 2014-04-12 DIAGNOSIS — G800 Spastic quadriplegic cerebral palsy: Secondary | ICD-10-CM | POA: Diagnosis present

## 2014-04-12 DIAGNOSIS — R6510 Systemic inflammatory response syndrome (SIRS) of non-infectious origin without acute organ dysfunction: Secondary | ICD-10-CM | POA: Diagnosis not present

## 2014-04-12 DIAGNOSIS — R748 Abnormal levels of other serum enzymes: Secondary | ICD-10-CM

## 2014-04-12 DIAGNOSIS — G808 Other cerebral palsy: Secondary | ICD-10-CM | POA: Diagnosis present

## 2014-04-12 DIAGNOSIS — D696 Thrombocytopenia, unspecified: Secondary | ICD-10-CM | POA: Diagnosis not present

## 2014-04-12 DIAGNOSIS — G809 Cerebral palsy, unspecified: Secondary | ICD-10-CM

## 2014-04-12 DIAGNOSIS — D72829 Elevated white blood cell count, unspecified: Secondary | ICD-10-CM | POA: Diagnosis present

## 2014-04-12 DIAGNOSIS — A419 Sepsis, unspecified organism: Secondary | ICD-10-CM | POA: Diagnosis not present

## 2014-04-12 DIAGNOSIS — G4733 Obstructive sleep apnea (adult) (pediatric): Secondary | ICD-10-CM | POA: Diagnosis present

## 2014-04-12 DIAGNOSIS — D61818 Other pancytopenia: Secondary | ICD-10-CM | POA: Insufficient documentation

## 2014-04-12 DIAGNOSIS — Z862 Personal history of diseases of the blood and blood-forming organs and certain disorders involving the immune mechanism: Secondary | ICD-10-CM

## 2014-04-12 DIAGNOSIS — R1310 Dysphagia, unspecified: Secondary | ICD-10-CM | POA: Diagnosis present

## 2014-04-12 DIAGNOSIS — R502 Drug induced fever: Secondary | ICD-10-CM | POA: Diagnosis not present

## 2014-04-12 DIAGNOSIS — G40909 Epilepsy, unspecified, not intractable, without status epilepticus: Secondary | ICD-10-CM

## 2014-04-12 DIAGNOSIS — R509 Fever, unspecified: Secondary | ICD-10-CM

## 2014-04-12 DIAGNOSIS — M2450 Contracture, unspecified joint: Secondary | ICD-10-CM | POA: Diagnosis present

## 2014-04-12 DIAGNOSIS — Z981 Arthrodesis status: Secondary | ICD-10-CM

## 2014-04-12 HISTORY — DX: Quadriplegia, unspecified: G82.50

## 2014-04-12 HISTORY — DX: Obstructive sleep apnea (adult) (pediatric): G47.33

## 2014-04-12 HISTORY — DX: Gastro-esophageal reflux disease without esophagitis: K21.9

## 2014-04-12 HISTORY — DX: Personal history of other medical treatment: Z92.89

## 2014-04-12 HISTORY — DX: Anemia, unspecified: D64.9

## 2014-04-12 HISTORY — DX: Pneumonitis due to inhalation of food and vomit: J69.0

## 2014-04-12 LAB — COMPREHENSIVE METABOLIC PANEL
ALK PHOS: 149 U/L — AB (ref 39–117)
ALT: 89 U/L — ABNORMAL HIGH (ref 0–53)
AST: 56 U/L — ABNORMAL HIGH (ref 0–37)
Albumin: 3.5 g/dL (ref 3.5–5.2)
Anion gap: 14 (ref 5–15)
BUN: 21 mg/dL (ref 6–23)
CO2: 26 mmol/L (ref 19–32)
Calcium: 8.9 mg/dL (ref 8.4–10.5)
Chloride: 105 mmol/L (ref 96–112)
Creatinine, Ser: 0.56 mg/dL (ref 0.50–1.35)
GLUCOSE: 78 mg/dL (ref 70–99)
POTASSIUM: 3.7 mmol/L (ref 3.5–5.1)
Sodium: 145 mmol/L (ref 135–145)
TOTAL PROTEIN: 6.4 g/dL (ref 6.0–8.3)
Total Bilirubin: 1.2 mg/dL (ref 0.3–1.2)

## 2014-04-12 LAB — URINALYSIS, ROUTINE W REFLEX MICROSCOPIC
Bilirubin Urine: NEGATIVE
Glucose, UA: NEGATIVE mg/dL
Hgb urine dipstick: NEGATIVE
Ketones, ur: NEGATIVE mg/dL
LEUKOCYTES UA: NEGATIVE
NITRITE: NEGATIVE
Specific Gravity, Urine: 1.031 — ABNORMAL HIGH (ref 1.005–1.030)
Urobilinogen, UA: 0.2 mg/dL (ref 0.0–1.0)
pH: 6 (ref 5.0–8.0)

## 2014-04-12 LAB — CBC WITH DIFFERENTIAL/PLATELET
BASOS ABS: 0 10*3/uL (ref 0.0–0.1)
Basophils Relative: 0 % (ref 0–1)
EOS PCT: 0 % (ref 0–5)
Eosinophils Absolute: 0 10*3/uL (ref 0.0–0.7)
HEMATOCRIT: 41.7 % (ref 39.0–52.0)
Hemoglobin: 13.1 g/dL (ref 13.0–17.0)
Lymphocytes Relative: 4 % — ABNORMAL LOW (ref 12–46)
Lymphs Abs: 1 10*3/uL (ref 0.7–4.0)
MCH: 29.9 pg (ref 26.0–34.0)
MCHC: 31.4 g/dL (ref 30.0–36.0)
MCV: 95.2 fL (ref 78.0–100.0)
MONOS PCT: 7 % (ref 3–12)
Monocytes Absolute: 1.8 10*3/uL — ABNORMAL HIGH (ref 0.1–1.0)
NEUTROS PCT: 89 % — AB (ref 43–77)
Neutro Abs: 23.2 10*3/uL — ABNORMAL HIGH (ref 1.7–7.7)
Platelets: 115 10*3/uL — ABNORMAL LOW (ref 150–400)
RBC: 4.38 MIL/uL (ref 4.22–5.81)
RDW: 17.4 % — AB (ref 11.5–15.5)
WBC: 26 10*3/uL — ABNORMAL HIGH (ref 4.0–10.5)

## 2014-04-12 LAB — I-STAT CG4 LACTIC ACID, ED
LACTIC ACID, VENOUS: 5.12 mmol/L — AB (ref 0.5–2.0)
Lactic Acid, Venous: 4.8 mmol/L (ref 0.5–2.0)

## 2014-04-12 LAB — GLUCOSE, CAPILLARY
GLUCOSE-CAPILLARY: 66 mg/dL — AB (ref 70–99)
Glucose-Capillary: 175 mg/dL — ABNORMAL HIGH (ref 70–99)

## 2014-04-12 LAB — APTT: aPTT: 31 seconds (ref 24–37)

## 2014-04-12 LAB — PROTIME-INR
INR: 1.08 (ref 0.00–1.49)
PROTHROMBIN TIME: 14.1 s (ref 11.6–15.2)

## 2014-04-12 LAB — URINE MICROSCOPIC-ADD ON

## 2014-04-12 LAB — PROCALCITONIN: PROCALCITONIN: 0.31 ng/mL

## 2014-04-12 MED ORDER — DEXTROSE 250 MG/ML IV SOLN: 0.5000 g/kg | Freq: Once | INTRAVENOUS | Status: DC

## 2014-04-12 MED ORDER — LEVALBUTEROL HCL 0.63 MG/3ML IN NEBU
0.6300 mg | INHALATION_SOLUTION | Freq: Four times a day (QID) | RESPIRATORY_TRACT | Status: DC | PRN
  Administered 2014-04-14: 0.63 mg via RESPIRATORY_TRACT

## 2014-04-12 MED ORDER — ONDANSETRON HCL 4 MG/2ML IJ SOLN: 4.0000 mg | Freq: Four times a day (QID) | INTRAMUSCULAR | Status: DC | PRN

## 2014-04-12 MED ORDER — GABAPENTIN 250 MG/5ML PO SOLN
825.0000 mg | Freq: Three times a day (TID) | ORAL | Status: DC
  Administered 2014-04-12 – 2014-04-14 (×6): 825 mg
  Filled 2014-04-12 (×8): qty 18

## 2014-04-12 MED ORDER — SIMETHICONE 80 MG PO CHEW: 80.0000 mg | CHEWABLE_TABLET | Freq: Four times a day (QID) | ORAL | Status: DC | PRN

## 2014-04-12 MED ORDER — ZINC OXIDE 11.3 % EX CREA
1.0000 "application " | TOPICAL_CREAM | Freq: Every day | CUTANEOUS | Status: DC
  Filled 2014-04-12: qty 56

## 2014-04-12 MED ORDER — DEXTROSE 5 % IV SOLN
2.0000 g | Freq: Once | INTRAVENOUS | Status: DC
  Filled 2014-04-12: qty 2

## 2014-04-12 MED ORDER — DEXTROSE 50 % IV SOLN
1.0000 | Freq: Once | INTRAVENOUS | Status: AC
  Administered 2014-04-12: 50 mL via INTRAVENOUS

## 2014-04-12 MED ORDER — ACETAMINOPHEN 160 MG/5ML PO SOLN
640.0000 mg | Freq: Once | ORAL | Status: AC
  Administered 2014-04-12: 640 mg via ORAL
  Filled 2014-04-12: qty 20.3

## 2014-04-12 MED ORDER — SODIUM CHLORIDE 0.9 % IV BOLUS (SEPSIS)
1000.0000 mL | Freq: Once | INTRAVENOUS | Status: AC
  Administered 2014-04-12: 1000 mL via INTRAVENOUS

## 2014-04-12 MED ORDER — DEXTROSE 50 % IV SOLN
INTRAVENOUS | Status: AC
  Administered 2014-04-12: 50 mL via INTRAVENOUS
  Filled 2014-04-12: qty 50

## 2014-04-12 MED ORDER — ACETAMINOPHEN 325 MG PO TABS
650.0000 mg | ORAL_TABLET | Freq: Four times a day (QID) | ORAL | Status: DC | PRN
  Administered 2014-04-13 (×2): 650 mg
  Filled 2014-04-12 (×2): qty 2

## 2014-04-12 MED ORDER — PRO-STAT SUGAR FREE PO LIQD
30.0000 mL | Freq: Two times a day (BID) | ORAL | Status: DC
  Administered 2014-04-13: 30 mL via ORAL
  Filled 2014-04-12 (×4): qty 30

## 2014-04-12 MED ORDER — ADULT MULTIVITAMIN LIQUID CH
10.0000 mL | Freq: Every day | ORAL | Status: DC
  Administered 2014-04-12 – 2014-04-14 (×3): 10 mL
  Filled 2014-04-12 (×3): qty 10

## 2014-04-12 MED ORDER — SODIUM CHLORIDE 0.9 % IV SOLN
INTRAVENOUS | Status: DC
  Administered 2014-04-12: 10:00:00 via INTRAVENOUS

## 2014-04-12 MED ORDER — VANCOMYCIN HCL IN DEXTROSE 750-5 MG/150ML-% IV SOLN
750.0000 mg | Freq: Three times a day (TID) | INTRAVENOUS | Status: DC
  Administered 2014-04-12 – 2014-04-13 (×2): 750 mg via INTRAVENOUS
  Filled 2014-04-12 (×6): qty 150

## 2014-04-12 MED ORDER — CLOBAZAM 2.5 MG/ML PO SUSP
5.0000 mg | Freq: Two times a day (BID) | ORAL | Status: DC
  Filled 2014-04-12 (×2): qty 4

## 2014-04-12 MED ORDER — VANCOMYCIN HCL IN DEXTROSE 1-5 GM/200ML-% IV SOLN
1000.0000 mg | Freq: Once | INTRAVENOUS | Status: AC
  Administered 2014-04-12: 1000 mg via INTRAVENOUS
  Filled 2014-04-12: qty 200

## 2014-04-12 MED ORDER — BACLOFEN 40000 MCG/20ML IT SOLN: 385.2000 ug | INTRATHECAL | Status: DC

## 2014-04-12 MED ORDER — FREE WATER
172.0000 mL | Freq: Four times a day (QID) | Status: DC
  Administered 2014-04-12 – 2014-04-13 (×2): 172 mL

## 2014-04-12 MED ORDER — CALCIUM CARBONATE 1250 MG/5ML PO SUSP
1250.0000 mg | Freq: Three times a day (TID) | ORAL | Status: DC
  Administered 2014-04-13 – 2014-04-14 (×3): 1250 mg
  Filled 2014-04-12 (×8): qty 15

## 2014-04-12 MED ORDER — LANSOPRAZOLE 30 MG PO TBDP
30.0000 mg | ORAL_TABLET | Freq: Two times a day (BID) | ORAL | Status: DC
  Filled 2014-04-12: qty 1

## 2014-04-12 MED ORDER — PANTOPRAZOLE SODIUM 40 MG PO PACK
40.0000 mg | PACK | Freq: Two times a day (BID) | ORAL | Status: DC
  Administered 2014-04-12 – 2014-04-14 (×4): 40 mg
  Filled 2014-04-12 (×7): qty 20

## 2014-04-12 MED ORDER — LEVOFLOXACIN IN D5W 750 MG/150ML IV SOLN
750.0000 mg | Freq: Once | INTRAVENOUS | Status: AC
  Administered 2014-04-12: 750 mg via INTRAVENOUS
  Filled 2014-04-12: qty 150

## 2014-04-12 MED ORDER — LEVALBUTEROL HCL 0.63 MG/3ML IN NEBU
0.6300 mg | INHALATION_SOLUTION | Freq: Two times a day (BID) | RESPIRATORY_TRACT | Status: DC
  Administered 2014-04-12 – 2014-04-14 (×4): 0.63 mg via RESPIRATORY_TRACT
  Filled 2014-04-12 (×6): qty 3

## 2014-04-12 MED ORDER — IPRATROPIUM-ALBUTEROL 0.5-2.5 (3) MG/3ML IN SOLN
3.0000 mL | Freq: Once | RESPIRATORY_TRACT | Status: AC
  Administered 2014-04-12: 3 mL via RESPIRATORY_TRACT
  Filled 2014-04-12: qty 3

## 2014-04-12 MED ORDER — ALBUTEROL SULFATE (2.5 MG/3ML) 0.083% IN NEBU: 2.5000 mg | INHALATION_SOLUTION | RESPIRATORY_TRACT | Status: DC | PRN

## 2014-04-12 MED ORDER — PIPERACILLIN-TAZOBACTAM 3.375 G IVPB 30 MIN
3.3750 g | Freq: Once | INTRAVENOUS | Status: AC
  Administered 2014-04-12: 3.375 g via INTRAVENOUS
  Filled 2014-04-12: qty 50

## 2014-04-12 MED ORDER — SUCRALFATE 1 GM/10ML PO SUSP
1.0000 g | Freq: Four times a day (QID) | ORAL | Status: DC
  Administered 2014-04-12 – 2014-04-14 (×5): 1 g
  Filled 2014-04-12 (×5): qty 10

## 2014-04-12 MED ORDER — PSEUDOEPHEDRINE HCL 15 MG/5ML PO LIQD
90.0000 mg | Freq: Four times a day (QID) | ORAL | Status: DC
  Filled 2014-04-12 (×2): qty 30

## 2014-04-12 MED ORDER — DIAZEPAM 10 MG RE GEL: 12.5000 mg | RECTAL | Status: DC | PRN

## 2014-04-12 MED ORDER — ONDANSETRON HCL 4 MG PO TABS: 4.0000 mg | ORAL_TABLET | Freq: Four times a day (QID) | ORAL | Status: DC | PRN

## 2014-04-12 MED ORDER — VITAMIN C 500 MG/5ML PO SYRP
300.0000 mg | ORAL_SOLUTION | Freq: Every morning | ORAL | Status: DC
  Administered 2014-04-13 – 2014-04-14 (×2): 300 mg
  Filled 2014-04-12 (×2): qty 3

## 2014-04-12 MED ORDER — ENOXAPARIN SODIUM 40 MG/0.4ML ~~LOC~~ SOLN: 40.0000 mg | SUBCUTANEOUS | Status: DC

## 2014-04-12 MED ORDER — CLOBAZAM 2.5 MG/ML PO SUSP
5.0000 mg | Freq: Two times a day (BID) | ORAL | Status: DC
Start: 2014-04-12 — End: 2014-04-14
  Administered 2014-04-12 – 2014-04-14 (×5): 5 mg
  Filled 2014-04-12 (×4): qty 4

## 2014-04-12 MED ORDER — KETOCONAZOLE 2 % EX CREA: 1.0000 "application " | TOPICAL_CREAM | Freq: Two times a day (BID) | CUTANEOUS | Status: DC | PRN

## 2014-04-12 MED ORDER — SODIUM CHLORIDE 0.9 % IJ SOLN
3.0000 mL | Freq: Two times a day (BID) | INTRAMUSCULAR | Status: DC
Start: 2014-04-12 — End: 2014-04-14
  Administered 2014-04-12 – 2014-04-13 (×2): 3 mL via INTRAVENOUS

## 2014-04-12 MED ORDER — FOLIC ACID 1 MG PO TABS
1.0000 mg | ORAL_TABLET | Freq: Every day | ORAL | Status: DC
Start: 2014-04-12 — End: 2014-04-14
  Administered 2014-04-13 – 2014-04-14 (×2): 1 mg
  Filled 2014-04-12 (×2): qty 1

## 2014-04-12 MED ORDER — SIMETHICONE 40 MG/0.6ML PO SUSP
120.0000 mg | Freq: Three times a day (TID) | ORAL | Status: DC
  Administered 2014-04-13 – 2014-04-14 (×3): 120 mg via ORAL
  Filled 2014-04-12 (×9): qty 1.8

## 2014-04-12 MED ORDER — DEXTROSE 5 % IV SOLN
INTRAVENOUS | Status: DC
  Administered 2014-04-12 – 2014-04-13 (×3): via INTRAVENOUS

## 2014-04-12 MED ORDER — ENOXAPARIN SODIUM 30 MG/0.3ML ~~LOC~~ SOLN: 30.0000 mg | SUBCUTANEOUS | Status: DC

## 2014-04-12 MED ORDER — MULTIVITAMIN PO LIQD: 10.0000 mL | Freq: Every day | ORAL | Status: DC

## 2014-04-12 MED ORDER — ZINC OXIDE 20 % EX OINT
TOPICAL_OINTMENT | Freq: Every day | CUTANEOUS | Status: DC
  Administered 2014-04-12 – 2014-04-13 (×2): 1 via TOPICAL
  Administered 2014-04-13 – 2014-04-14 (×2): via TOPICAL
  Filled 2014-04-12: qty 28.35

## 2014-04-12 MED ORDER — PIPERACILLIN-TAZOBACTAM 3.375 G IVPB
3.3750 g | Freq: Three times a day (TID) | INTRAVENOUS | Status: DC
  Administered 2014-04-13 (×2): 3.375 g via INTRAVENOUS
  Filled 2014-04-12 (×6): qty 50

## 2014-04-12 NOTE — Progress Notes (Signed)
ED nursing discussed with patient's healthcare power of attorney and there was some confusion related to CODE STATUS. Called and discussed in detail with Ms [name] Wyatt/HCPOA who wished to change CODE STATUS to partial code.  No CPR or defibrillation Would like mechanical ventilation and medications. Would not want prolonged ventilation or tracheostomy.  Vernell Leep, MD, FACP, FHM. [facility] Pager [phone]  If 7PM-7AM, please contact night-coverage [email or web address] Password [email or web address] 04/12/2014, 2:10 PM

## 2014-04-12 NOTE — Progress Notes (Signed)
ANTIBIOTIC CONSULT NOTE - INITIAL  Pharmacy Consult for vancomycin and zosyn Indication: rule out sepsis  Allergies  Allergen Reactions  . Adhesive [Tape]     Rips skin off  . Depakote [Divalproex Sodium]     Causes pancreatitis   . Keppra [Levetiracetam] Other (See Comments)    Bone marrow suppression  . Amoxicillin-Pot Clavulanate Rash    Patient Measurements: Height: 4\' 2"  (127 cm) Weight: 128 lb (58.06 kg) IBW/kg (Calculated) : 27   Vital Signs: Temp: 100.9 F (38.3 C) (03/11 0725) Temp Source: Rectal (03/11 0725) BP: 107/68 mmHg (03/11 0930) Pulse Rate: 138 (03/11 0930) Intake/Output from previous day:   Intake/Output from this shift:    Labs:  Recent Labs  04/11/14 1312 04/11/14 1347 04/12/14 0650  WBC 2.3*  --  26.0*  HGB 14.0  --  13.1  PLT 146 Platelet count confirmed by slide estimate  --  115*  CREATININE  --  0.5* 0.56   Estimated Creatinine Clearance: 80 mL/min (by C-G formula based on Cr of 0.56). No results for input(s): VANCOTROUGH, VANCOPEAK, VANCORANDOM, GENTTROUGH, GENTPEAK, GENTRANDOM, TOBRATROUGH, TOBRAPEAK, TOBRARND, AMIKACINPEAK, AMIKACINTROU, AMIKACIN in the last 72 hours.   Microbiology: Recent Results (from the past 720 hour(s))  Blood culture (routine x 2)     Status: None   Collection Time: 03/28/14 10:31 PM  Result Value Ref Range Status   Specimen Description BLOOD LEFT HAND  Final   Special Requests BOTTLES DRAWN AEROBIC AND ANAEROBIC 5CC  Final   Culture   Final    NO GROWTH 5 DAYS Performed at [facility]    Report Status 04/04/2014 FINAL  Final  Blood culture (routine x 2)     Status: None   Collection Time: 03/28/14 10:56 PM  Result Value Ref Range Status   Specimen Description BLOOD RIGHT PORTA CATH  Final   Special Requests BOTTLES DRAWN AEROBIC AND ANAEROBIC 5CC  Final   Culture   Final    NO GROWTH 5 DAYS Performed at [facility]    Report Status 04/04/2014 FINAL  Final  Urine culture      Status: None   Collection Time: 03/29/14 12:24 AM  Result Value Ref Range Status   Specimen Description URINE, CATHETERIZED  Final   Special Requests NONE  Final   Colony Count NO GROWTH Performed at [facility]   Final   Culture NO GROWTH Performed at [facility]   Final   Report Status 03/30/2014 FINAL  Final  MRSA PCR Screening     Status: None   Collection Time: 03/29/14  6:19 AM  Result Value Ref Range Status   MRSA by PCR NEGATIVE NEGATIVE Final    Comment:        The GeneXpert MRSA Assay (FDA approved for NASAL specimens only), is one component of a comprehensive MRSA colonization surveillance program. It is not intended to diagnose MRSA infection nor to guide or monitor treatment for MRSA infections.     Medical History: Past Medical History  Diagnosis Date  . CP (cerebral palsy), spastic, quadriplegic   . Epilepsy   . Osteoporosis   . Inguinal hernia   . Undescended testes   . Seasonal allergies   . IVH (intraventricular hemorrhage)     Grade IV  . Hip dislocation, bilateral   . Seizures   . Hx: UTI (urinary tract infection)   . Dysphagia   . Otitis media   . Retinopathy of prematurity   .  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 Neuromuscular scoliosis   . Osteoporosis   . Complex partial seizures   . Generalized convulsive epilepsy without mention of intractable epilepsy   . Epilepsy   . Sinus bradycardia     HR drops to 38-40 while sleeping  . Sleep apnea     BiPAP  . Blister of right heel     fluid filled; origin unknown  . Kidney stones     ?  Marland Kitchen Pneumonia      chronic pneumonia ,respitory failure dx Augest 2014    Medications:  No abx  Assessment: 24 yo M with cerebral palsy, spastic quadriplegia, complicated medical history.  Pharmacy consulted to dose vancomycin and zosyn for sepsis.  Pt with fever and altered mental status.  Wt 58 kg; wbc 2.3> 26 - given Neulasta by heme/onc 3/10, creat 0.45, temp 100.9, lactic  acid 5.12.  On 03/03/14 his vancomycin trough was 12.7 mcg/ml drawn late on vancomycin 750 IV q8h and this dose was continued  vanc 3/11> Zosyn 3/11> lvq x 1 3/11>    3/11 BCx2> 3/11 Ucx>  Goal of Therapy:  Vancomycin trough level 15-20 mcg/ml  Plan:  -levaquin 750 mg IV x 1 given in ED at 0809 -vanc 1 gm given in ED at 0709, then vancomycin 750 mg IV q8h -zosyn 3.375 gm x 1 dose over 30 minutes now (I d/w ED RN) then 3.375 gm IV q8h, infuse each dose over 4 hours -f/u renal fxn, wbc, temp, culture data -steady-state vancomycin trough as needed  Eudelia Bunch, Pharm.D. [phone] 04/12/2014 9:52 AM

## 2014-04-12 NOTE — Progress Notes (Signed)
Physician notified: Hongali At: 1351  Regarding:  Pt currently in ED, ED RN note/stated caregiver wants code status addressed. no compression, no shock, no trach. Still FC in epicThanks. Awaiting return response.   Returned Response at: 1353  Order(s): Talk with caregiver to readdress code status wishes. This RN called Almyra Free, RN ED to speak with Ms. Jeffery [name], [place].

## 2014-04-12 NOTE — ED Notes (Signed)
Lab results reported to [facility].

## 2014-04-12 NOTE — Progress Notes (Signed)
Spoke with legal guardian to clarify TF orders but she does not want TF started in this patient at this time while acutely ill.  Endora Teresi S. Alford Highland, PharmD, [facility] Clinical Staff Pharmacist Pager [phone]

## 2014-04-12 NOTE — ED Notes (Signed)
Pt arrives from home, noted fever over the last day, tylenol at 0230. 100.5 post tylenol. GTUBE. Pt's mom states pt not himself.

## 2014-04-12 NOTE — Progress Notes (Signed)
Hypoglycemic Event  CBG: 66  Treatment: D50 IV 50 mL  Symptoms: Sweaty  Follow-up CBG: Time:2030 CBG Result:175  Possible Reasons for Event: Inadequate meal intake; pt was not restarted on his home tube feeding.  Comments/MD notified: MD Hongalgi notified of pt's cbg. New orders were given for 1 amp of Dextrose, D5 continuous at 150 ml/hr as well as the hypoglycemic protocol was initiated.   Nevin Bloodgood R  Remember to initiate Hypoglycemia Order Set & complete

## 2014-04-12 NOTE — ED Notes (Addendum)
Speaking to legal guardian, she states pt is full code "but he is not to have a trach or be intubated if the outcome will not be able to wean him off the vent, and only drugs for cardiac, no chest compressions and no shock".  Meds brought from home and walked to pharmacy for dosing.

## 2014-04-12 NOTE — Consult Note (Signed)
Referral MD  Reason for Referral: Leukocytosis   Chief Complaint  Patient presents with  . Altered Mental Status  . Fever  : Cannot give history  HPI: Jeffery [name] is well-known to me. I saw him initially in the hospital a couple weeks ago. I just saw him yesterday. In the office, he looked fantastic.  His white cell count was trending downward. Because of this, I gave him a dose of Neulasta.  Previously, he gotten Neupogen without any difficulties.  Yesterday evening, began to have a temperature. He had tachycardia.  He went to the emergency room. He subsequently was admitted.  On admission,  his white count was 20,000 with 89% segs. His hemoglobin was 13 and platelet 115,000 . His lactic acid was 5.12. He had normal renal function. His liver function tests were slightly elevated.  A chest x-ray was negative.  Cultures are pending. He was started on some antibiotics.  Again, we saw him yesterday, he looked fantastic.  He's had no seizures. He gets his to feeds. He's been doing well with that.  He did not have any of his pumps accessed.  .   Past Medical History  Diagnosis Date  . CP (cerebral palsy), spastic, quadriplegic   . Osteoporosis   . Undescended testes   . Seasonal allergies   . IVH (intraventricular hemorrhage) 12-16-90    Grade IV  . Hip dislocation, bilateral   . Dysphagia   . Retinopathy of prematurity   . 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 Neuromuscular scoliosis   . Osteoporosis   . Complex partial seizures   . Generalized convulsive epilepsy without mention of intractable epilepsy   . Sinus bradycardia     HR drops to 38-40 while sleeping  . Blister of right heel     fluid filled; origin unknown  . Kidney stones     ?  Marland Kitchen Pneumonia      chronic pneumonia ,respitory failure dx Augest 2014  . Aspiration pneumonia     "chronic" (04/12/2014)  . OSA treated with BiPAP     "since age 45"   . Anemia   . 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 GERD (gastroesophageal reflux disease)   . Epilepsy   . Spastic quadriplegia   :  Past Surgical History  Procedure Laterality Date  . Mole removal  "several B/T 2008-2010"    "from all over"  . Jejunostomy feeding tube  03/08/2013; ~ 09/2013; ~ 01/2014    "transgastric-jujunal feeding tube"  . Gastrostomy tube placement  11./02/1998       . Button change  12/15/2010    Procedure: BUTTON CHANGE;  Surgeon: Gatha Mayer, MD;  Location: [facility];  Service: Endoscopy;  Laterality: N/A;  . Peg placement  10/07/2011    Procedure: PERCUTANEOUS ENDOSCOPIC GASTROSTOMY (PEG) REPLACEMENT;  Surgeon: Lafayette Dragon, MD;  Location: [facility];  Service: Endoscopy;  Laterality: N/A;  Needs 18 F 2.5 button ordered-dl  . Inguinal hernia repair Bilateral 1992  . Retinopathy of prematurity surgery  1992  . Achilles tendon lengthening Bilateral 12/1998  . Tendon release  12/1998    Soft tissue releases  wrists and fingers [Other]  . Infusion pump implantation  07/25/2000; 2013    Intrathecal baclofen pump  . Peg placement N/A 06/05/2012    Procedure: PERCUTANEOUS ENDOSCOPIC GASTROSTOMY (PEG) REPLACEMENT;  Surgeon: Lafayette Dragon, MD;  Location: [facility];  Service: Endoscopy;  Laterality: N/A;  button 90f.2.5cm  . Strabismus surgery Bilateral 1993    "3 on right; 2 on left"  . Back surgery  ~ 2008    Harrington Rods in back needs to be log rolled  . Tendon repair Bilateral 09/05/2012    Procedure: LENGTHENING OF DIGITAL FLEXOR TENDONS BILTERAL HANDS;  Surgeon: DJolyn Nap MD;  [facility]  Service: Orthopedics;  Laterality: Bilateral;  . Peg placement N/A 09/13/2012    Procedure: PERCUTANEOUS ENDOSCOPIC GASTROSTOMY (PEG) REPLACEMENT;  Surgeon: DLafayette Dragon MD;  Location: [facility];  Service: Endoscopy;  Laterality: N/A;  . Flexible sigmoidoscopy N/A 10/30/2012    Procedure: FLEXIBLE SIGMOIDOSCOPY;  Surgeon: RCleotis Nipper MD;   Location: [facility];  Service: Endoscopy;  Laterality: N/A;  . Peg placement N/A 11/15/2012    Procedure: PERCUTANEOUS ENDOSCOPIC GASTROSTOMY (PEG) REPLACEMENT;  Surgeon: MJeryl Columbia MD;  [facility];  Service: Endoscopy;  Laterality: N/A;  . Gastrostomy tube placement  11./02/1998  . Rectal biopsy  10/29/2012    S/P diarrhea from Vancomycin  . Portacath placement Right 11/25/2012    chest  . Eye surgery    :   Current facility-administered medications:  .  acetaminophen (TYLENOL) tablet 650 mg, 650 mg, Per Tube, Q6H PRN, AModena Jansky MD .  albuterol (PROVENTIL) (2.5 MG/3ML) 0.083% nebulizer solution 2.5 mg, 2.5 mg, Nebulization, Q2H PRN, AModena Jansky MD .  baclofen (GABLOFEN) intrathecal injection 40000 mcg/250m 385.2 mcg, Intrathecal, Continuous, AnModena JanskyMD .  calcium carbonate (dosed in mg elemental calcium) suspension 1,250 mg of elemental calcium, 1,250 mg of elemental calcium, Per Tube, TID, AnModena JanskyMD .  cloBAZam (ONFI) 2.5 MG/ML oral suspension 5 mg, 5 mg, Per Tube, BID, Kendra P Hiatt, RPH, 5 mg at 04/12/14 1822 .  dextrose 5 % solution, , Intravenous, Continuous, AnModena JanskyMD, Last Rate: 150 mL/hr at 04/12/14 1953 .  diazepam (DIASTAT ACUDIAL) rectal kit 12.5 mg, 12.5 mg, Rectal, PRN, AnModena JanskyMD .  enoxaparin (LOVENOX) injection 30 mg, 30 mg, Subcutaneous, Q24H, Kendra P Hiatt, RPH .  feeding supplement (PRO-STAT SUGAR FREE 64) liquid 30 mL, 30 mL, Oral, BID, AnModena JanskyMD .  folic acid (FOLVITE) tablet 1 mg, 1 mg, Per Tube, Daily, AnModena JanskyMD .  free water 172 mL, 172 mL, Per Tube, QID, AnModena JanskyMD, 172 mL at 04/12/14 1836 .  gabapentin (NEURONTIN) 250 MG/5ML solution 825 mg, 825 mg, Per Tube, TID, AnModena JanskyMD, 825 mg at 04/12/14 1827 .  ketoconazole (NIZORAL) 2 % cream 1 application, 1 application, Topical, BID PRN, AnModena JanskyMD .  levalbuterol (XOPENEX) nebulizer solution  0.63 mg, 0.63 mg, Nebulization, BID, MaRitta SlotNP .  levalbuterol (XOPENEX) nebulizer solution 0.63 mg, 0.63 mg, Nebulization, Q6H PRN, MaRitta SlotNP .  multivitamin liquid 10 mL, 10 mL, Per Tube, Daily, Kendra P Hiatt, RPH, 10 mL at 04/12/14 1830 .  ondansetron (ZOFRAN) tablet 4 mg, 4 mg, Oral, Q6H PRN **OR** ondansetron (ZOFRAN) injection 4 mg, 4 mg, Intravenous, Q6H PRN, AnModena JanskyMD .  pantoprazole sodium (PROTONIX) 40 mg/20 mL oral suspension 40 mg, 40 mg, Per Tube, BID, KeGaynell Faceiatt, RPH .  piperacillin-tazobactam (ZOSYN) IVPB 3.375 g, 3.375 g, Intravenous, Q8H, MiEudelia BunchRPH .  Pseudoephedrine HCl LIQD 90 mg, 90 mg, Tube, Q6H, AnModena JanskyMD .  simethicone (MYLICON) 40  MG/0.6ML suspension 120 mg, 120 mg, Oral, TID, Gaynell Face Hiatt, RPH, 120 mg at 04/12/14 1854 .  sodium chloride 0.9 % injection 3 mL, 3 mL, Intravenous, Q12H, Anand D Hongalgi, MD .  sucralfate (CARAFATE) 1 GM/10ML suspension 1 g, 1 g, Per Tube, QID, Modena Jansky, MD .  vancomycin (VANCOCIN) IVPB 750 mg/150 ml premix, 750 mg, Intravenous, Q8H, Eudelia Bunch, RPH .  [START ON 04/13/2014] Vitamin C LIQD 300 mg, 300 mg, PEG Tube, q morning - 10a, Modena Jansky, MD .  zinc oxide 20 % ointment, , Topical, 5 X Daily, Modena Jansky, MD, 0  at 04/12/14 1832:  . calcium carbonate (dosed in mg elemental calcium)  1,250 mg of elemental calcium Per Tube TID  . cloBAZam  5 mg Per Tube BID  . enoxaparin (LOVENOX) injection  30 mg Subcutaneous Q24H  . feeding supplement (PRO-STAT SUGAR FREE 64)  30 mL Oral BID  . folic acid  1 mg Per Tube Daily  . free water  172 mL Per Tube QID  . gabapentin  825 mg Per Tube TID  . levalbuterol  0.63 mg Nebulization BID  . multivitamin  10 mL Per Tube Daily  . pantoprazole sodium  40 mg Per Tube BID  . piperacillin-tazobactam (ZOSYN)  IV  3.375 g Intravenous Q8H  . Pseudoephedrine HCl  90 mg Tube Q6H  . simethicone  120 mg Oral TID  . sodium chloride  3 mL  Intravenous Q12H  . sucralfate  1 g Per Tube QID  . vancomycin  750 mg Intravenous Q8H  . [START ON 04/13/2014] Vitamin C  300 mg PEG Tube q morning - 10a  . zinc oxide   Topical 5 X Daily  :  Allergies  Allergen Reactions  . Adhesive [Tape]     Rips skin off  . Depakote [Divalproex Sodium]     Causes pancreatitis   . Keppra [Levetiracetam] Other (See Comments)    Bone marrow suppression  . Amoxicillin-Pot Clavulanate Rash  :  Family History  Problem Relation Age of Onset  . Adopted: Yes  :  History   Social History  . Marital Status: Single    Spouse Name: N/A  . Number of Children: 0  . Years of Education: N/A   Occupational History  . Student    .     Social History Main Topics  . Smoking status: Never Smoker   . Smokeless tobacco: Never Used  . Alcohol Use: No  . Drug Use: No  . Sexual Activity: No   Other Topics Concern  . Not on file   Social History Narrative   Pt lives at home with his legal guardians Jenne Campus)        :  Pertinent items are noted in HPI.  Exam: Patient Vitals for the past 24 hrs:  BP Temp Temp src Pulse Resp SpO2 Height Weight  04/12/14 1600 125/76 mmHg 99.8 F (37.7 C) Rectal (!) 121 17 96 % - -  04/12/14 1118 127/64 mmHg - - (!) 133 25 96 % - -  04/12/14 1000 119/83 mmHg - - (!) 138 22 96 % - -  04/12/14 0930 107/68 mmHg - - (!) 138 20 93 % - -  04/12/14 0915 115/74 mmHg - - (!) 132 22 93 % - -  04/12/14 0845 128/64 mmHg - - (!) 129 20 96 % - -  04/12/14 0815 - - - - - - 4' 2"  (1.27  m) 128 lb (58.06 kg)  04/12/14 0815 130/70 mmHg - - (!) 132 25 93 % - -  04/12/14 0745 168/79 mmHg - - (!) 130 21 95 % - -  04/12/14 0725 - 100.9 F (38.3 C) Rectal - - - - -  04/12/14 0715 130/87 mmHg - - (!) 131 (!) 28 91 % - -  04/12/14 0645 120/82 mmHg - - (!) 131 22 93 % - -  04/12/14 0619 124/87 mmHg 99.9 F (37.7 C) Axillary (!) 131 16 94 % - -  04/12/14 0610 - - - - - 97 % - -   Chronic deformities. No oral lesions. No skin  rashes. Cardiac exam slightly tachycardic without murmur. Lungs with good breath sounds bilaterally. Abdomen slightly distended. Good bowel sounds. No palpable liver or spleen. Extremities shows some trace edema. Skin exam no rashes. No ecchymoses.    Recent Labs  04/11/14 1312 04/12/14 0650  WBC 2.3* 26.0*  HGB 14.0 13.1  HCT 44.3 41.7  PLT 146 Platelet count confirmed by slide estimate 115*    Recent Labs  04/11/14 1347 04/12/14 0650  NA 146* 145  K 3.8 3.7  CL 101 105  CO2 32 26  GLUCOSE 101 78  BUN 19 21  CREATININE 0.5* 0.56  CALCIUM 10.0 8.9    Blood smear reviewNone  PathNone   Assessment and   Jeffery [name] is a 24 year old gentleman. He has cerebral palsy. He is chronically in bed. He does get into a wheelchair.  He clearly is not neutropenic. The Neulasta definitely has white cell count up.  I wonder if he did not have a reaction to the Neulasta. This is unusual but yet not unheard of. It is possible that the chemical structure of Neulasta could have triggered some type of reaction.  I don't see any localizing signs of infection. He did not aspirate. There is no cellulitis.  I think that if his cultures are negative, I would stop his antibiotics. Again, I would think that this is from Neulasta, he should be afebrile shortly.  I did look at his blood smear. I will get his blood smear in the office. I do not see anything that looked suspicious. There is no obvious myeloproliferative disease. The white cells are very mature.  I spoke to his parents. I know them well. Jeffery [name] does not look all that bad. Again, when I first saw him, he was quite anemic and thrombocytopenia. These have pretty much resolved. Hopefully, just the anti-seizure medicine that he was on triggered this recent pancytopenia.  We will follow along.  [name]  Hebrews 12;12

## 2014-04-12 NOTE — Progress Notes (Signed)
Wasted 5 mg of ONFI which is 2.5 ml with Victoria,RN in [facility] in pyxis room. Hoover Brunette, RN

## 2014-04-12 NOTE — ED Notes (Signed)
Lactic Acid labs results reported to EDP.

## 2014-04-12 NOTE — ED Provider Notes (Signed)
CSN: [id number]     Arrival date & time 04/12/14  0608 History   First MD Initiated Contact with Patient 04/12/14 [phone]     Chief Complaint  Patient presents with  . Altered Mental Status  . Fever     (Consider location/radiation/quality/duration/timing/severity/associated sxs/prior Treatment) Patient is a 24 y.o. male presenting with altered mental status and fever. The history is provided by a parent. The history is limited by the condition of the patient (Nonverbal, altered mental status).  Altered Mental Status Associated symptoms: fever   Fever He was in his usual state of health until last evening when he started having a fast heart rate and then developed a fever. Temperature was up to 100.8 and heart rate was up over 140. Along with this, he was less alert and less interactive than normal. He was treated with acetaminophen at home with no obvious improvement. There is been no cough and no vomiting or diarrhea. He is bedbound with congenital quadriplegia and has a Mediport, intrathecal catheter for baclofen infusion, and also has a PEG tube. He recently was evaluated for pancytopenia which is felt to be due to medication which was discontinued. He did see his hematologist yesterday and received a Neupogen infusion. He has had hospitalizations for sepsis without source identified.  Past Medical History  Diagnosis Date  . CP (cerebral palsy), spastic, quadriplegic   . Epilepsy   . Osteoporosis   . Inguinal hernia   . Undescended testes   . Seasonal allergies   . IVH (intraventricular hemorrhage)     Grade IV  . Hip dislocation, bilateral   . Seizures   . Hx: UTI (urinary tract infection)   . Dysphagia   . Otitis media   . Retinopathy of prematurity   . 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 Neuromuscular scoliosis   . Osteoporosis   . Complex partial seizures   . Generalized convulsive epilepsy without mention of intractable epilepsy   . Epilepsy   . Sinus bradycardia      HR drops to 38-40 while sleeping  . Sleep apnea     BiPAP  . Blister of right heel     fluid filled; origin unknown  . Kidney stones     ?  Marland Kitchen Pneumonia      chronic pneumonia ,respitory failure dx Augest 2014   Past Surgical History  Procedure Laterality Date  . Excision of moles    . Peg placement      With multiple changes  . Gastrostomy tube, change / reposition  12/15/2010       . Button change  12/15/2010    Procedure: BUTTON CHANGE;  Surgeon: Gatha Mayer, MD;  Location: [facility];  Service: Endoscopy;  Laterality: N/A;  . Peg placement  10/07/2011    Procedure: PERCUTANEOUS ENDOSCOPIC GASTROSTOMY (PEG) REPLACEMENT;  Surgeon: Lafayette Dragon, MD;  Location: [facility];  Service: Endoscopy;  Laterality: N/A;  Needs 18 F 2.5 button ordered-dl  . Inguinal hernia repair Bilateral 1992  . Retinopathy of prematurity surgery  1992  . Achilles tendon lengthening  12/1998  . Soft tissue releases  wrists and fingers  12/1998  . Intrathecal baclofen pump placement  07/25/2000  . Peg placement N/A 06/05/2012    Procedure: PERCUTANEOUS ENDOSCOPIC GASTROSTOMY (PEG) REPLACEMENT;  Surgeon: Lafayette Dragon, MD;  Location: [facility];  Service: Endoscopy;  Laterality: N/A;  button 66fr.2.5cm  . Eye surgery Bilateral     Retinal   . Back  surgery      Harrington Rods in back needs to be log rolled  . Tendon repair Bilateral 09/05/2012    Procedure: LENGTHENING OF DIGITAL FLEXOR TENDONS BILTERAL HANDS;  Surgeon: Jolyn Nap, MD;  Location: [facility];  Service: Orthopedics;  Laterality: Bilateral;  . Hand surgery Bilateral Aug. 2014  . Peg placement N/A 09/13/2012    Procedure: PERCUTANEOUS ENDOSCOPIC GASTROSTOMY (PEG) REPLACEMENT;  Surgeon: Lafayette Dragon, MD;  Location: [facility];  Service: Endoscopy;  Laterality: N/A;  . Flexible sigmoidoscopy N/A 10/30/2012    Procedure: FLEXIBLE SIGMOIDOSCOPY;  Surgeon: Cleotis Nipper, MD;  Location: [facility];  Service: Endoscopy;  Laterality:  N/A;  . Peg placement N/A 11/15/2012    Procedure: PERCUTANEOUS ENDOSCOPIC GASTROSTOMY (PEG) REPLACEMENT;  Surgeon: Jeryl Columbia, MD;  Location: [facility];  Service: Endoscopy;  Laterality: N/A;   Family History  Problem Relation Age of Onset  . Adopted: Yes   History  Substance Use Topics  . Smoking status: Never Smoker   . Smokeless tobacco: Never Used     Comment: no H/S smoking  . Alcohol Use: No    Review of Systems  Unable to perform ROS: Patient nonverbal  Constitutional: Positive for fever.      Allergies  Adhesive; Depakote; and Amoxicillin-pot clavulanate  Home Medications   Prior to Admission medications   Medication Sig Start Date End Date Taking? Authorizing Provider  albuterol (PROVENTIL HFA;VENTOLIN HFA) 108 (90 BASE) MCG/ACT inhaler Inhale 2 puffs into the lungs every 4 (four) hours as needed for shortness of breath.    Historical Provider, MD  albuterol (PROVENTIL) (2.5 MG/3ML) 0.083% nebulizer solution Take 2.5 mg by nebulization. 1 ampule twice daily and as needed (making 4 doses daily) 10/11/12   Elsie Stain, MD  Ascorbic Acid (VITAMIN C) 500 MG/5ML LIQD 300 mg by PEG Tube route at bedtime.     Historical Provider, MD  baclofen (GABLOFEN) 40000 MCG/20ML SOLN 385.2 mcg by Intrathecal route continuous.  05/04/12   Jodi Geralds, MD  calcium carbonate, dosed in mg elemental calcium, 1250 MG/5ML 1,250 mg of elemental calcium by PEG Tube route 3 (three) times daily. 5 cc via peg tube    Historical Provider, MD  cloBAZam (ONFI) 2.5 MG/ML solution Place 2 mLs (5 mg total) into feeding tube 2 (two) times daily. 04/01/14   Shanker Kristeen Mans, MD  diazepam (DIASTAT ACUDIAL) 10 MG GEL Place 12.5 mg rectally as needed for seizure.     Historical Provider, MD  dicyclomine (BENTYL) 10 MG/5ML syrup Place into feeding tube every 8 (eight) hours as needed. Not to exceed 5 doses in 1 wk    Historical Provider, MD  diphenoxylate-atropine (LOMOTIL) 2.5-0.025 MG/5ML  liquid Place into feeding tube 4 (four) times daily as needed for diarrhea or loose stools. 1-2 tsp via feeding tube q6h prn loose stools    Historical Provider, MD  docusate (COLACE) 50 MG/5ML liquid Take 50 mg by mouth daily as needed for mild constipation.    Historical Provider, MD  folic acid (FOLVITE) 1 MG tablet Take 1 mg by mouth daily.    Historical Provider, MD  furosemide (LASIX) 10 MG/ML solution Place 20 mg into feeding tube daily as needed for fluid.     Historical Provider, MD  gabapentin (NEURONTIN) 250 MG/5ML solution Take 18 mL 3 times daily Patient taking differently: Place 825 mg into feeding tube 3 (three) times daily. Take 18 mL 3 times daily 12/18/13   Princess Bruins  Hickling, MD  ketoconazole (NIZORAL) 2 % cream Apply 1 application topically 2 (two) times daily as needed for irritation.    Historical Provider, MD  lansoprazole (PREVACID SOLUTAB) 30 MG disintegrating tablet Take 30 mg by mouth 2 (two) times daily. 730am 09/07/12   Lafayette Dragon, MD  miconazole (MICOTIN) 2 % cream Apply topically 2 (two) times daily. 03/04/14   Costin Karlyne Greenspan, MD  midazolam (VERSED) 5 MG/ML injection Place 2 mLs (10 mg total) into the nose once. Draw up 70ml in 2 syringes. Remove blue vial access device. Attach syringe to nasal atomizer for intranasal administration. Give 14ml in right nostril x 2 for seizures lasting 2 minutes or longer or for repetitive seizures in a short period of time. 11/05/13   Joelyn Oms, NP  Multiple Vitamins-Minerals (ZINC PO) 5 mLs by PEG Tube route daily. 244 mg / 5 mL zinc solution    Historical Provider, MD  mupirocin ointment (BACTROBAN) 2 % Apply 1 application topically as needed (to G-T site). 09/07/12   Lafayette Dragon, MD  Nutritional Supplements (PROMOD PO) Take 30 mLs by mouth 2 (two) times daily. Noon and 10pm    Historical Provider, MD  Nutritional Supplements (TWOCAL HN) LIQD Take 237 mLs by mouth. T.3 can at 46 cc hour x 12 hours - water 172 cc pre and post each  and extra 172 bid.    Historical Provider, MD  nystatin cream (MYCOSTATIN) Apply 6,761,950 application topically. 3-4 times daily 11/29/12   Samella Parr, NP  OVER THE COUNTER MEDICATION Apply 1 application topically as needed (after diaper changes). Balmex and Bag Balm paste made and applied after diaper changes    Historical Provider, MD  OXYGEN-HELIUM IN Inhale into the lungs. Oxygen PRN to keep O2 Sat at 90%    Historical Provider, MD  polyethylene glycol (MIRALAX / GLYCOLAX) packet Take 17 g by mouth daily.    Historical Provider, MD  Pseudoephedrine HCl (SUDAFED CHILDRENS) 15 MG/5ML LIQD Give 90 mg by tube every 6 (six) hours.     Historical Provider, MD  simethicone (MYLICON) 932 MG chewable tablet Chew 125 mg by mouth every 6 (six) hours as needed for flatulence.    Historical Provider, MD  sodium phosphate (FLEET) enema Place 1 enema rectally. follow package directions PRN    Historical Provider, MD  sucralfate (CARAFATE) 1 GM/10ML suspension Place 1 g into feeding tube 4 (four) times daily. 7am, 130pm, 5pm, 8pm    Historical Provider, MD  Water For Irrigation, Sterile (FREE WATER) SOLN Place 172 mLs into feeding tube 4 (four) times daily.    Historical Provider, MD  zinc oxide (BALMEX) 11.3 % CREA cream Apply 1 application topically 5 (five) times daily. With each diaper change.    Historical Provider, MD   BP 124/87 mmHg  Pulse 131  Temp(Src) 99.9 F (37.7 C) (Axillary)  Resp 16  SpO2 94% Physical Exam  Nursing note and vitals reviewed.  24 year old male, resting comfortably and in no acute distress. Vital signs are significant for tachycardia. Oxygen saturation is 94%, which is normal. Head is normocephalic and atraumatic. PERRLA, EOMI. Oropharynx is clear. Neck is nontender and supple without adenopathy or JVD. Lungs have coarse rhonchi diffusely without rales or wheezes. Chest is nontender. Heart has regular rate and rhythm without murmur. Abdomen is soft, flat, nontender  without masses or hepatosplenomegaly and peristalsis is normoactive. Extremities have contractures present and are somewhat atrophic. Skin is warm and dry without  rash. Neurologic: He is awake but does not interact with his environment, cranial nerves are intact. Spastic quadriplegia is present.  ED Course  Procedures (including critical care time) Labs Review Results for orders placed or performed during the hospital encounter of 04/12/14  CBC WITH DIFFERENTIAL  Result Value Ref Range   WBC 26.0 (H) 4.0 - 10.5 K/uL   RBC 4.38 4.22 - 5.81 MIL/uL   Hemoglobin 13.1 13.0 - 17.0 g/dL   HCT 41.7 39.0 - 52.0 %   MCV 95.2 78.0 - 100.0 fL   MCH 29.9 26.0 - 34.0 pg   MCHC 31.4 30.0 - 36.0 g/dL   RDW 17.4 (H) 11.5 - 15.5 %   Platelets 115 (L) 150 - 400 K/uL   Neutrophils Relative % 89 (H) 43 - 77 %   Lymphocytes Relative 4 (L) 12 - 46 %   Monocytes Relative 7 3 - 12 %   Eosinophils Relative 0 0 - 5 %   Basophils Relative 0 0 - 1 %   Neutro Abs 23.2 (H) 1.7 - 7.7 K/uL   Lymphs Abs 1.0 0.7 - 4.0 K/uL   Monocytes Absolute 1.8 (H) 0.1 - 1.0 K/uL   Eosinophils Absolute 0.0 0.0 - 0.7 K/uL   Basophils Absolute 0.0 0.0 - 0.1 K/uL  Comprehensive metabolic panel  Result Value Ref Range   Sodium 145 135 - 145 mmol/L   Potassium 3.7 3.5 - 5.1 mmol/L   Chloride 105 96 - 112 mmol/L   CO2 26 19 - 32 mmol/L   Glucose, Bld 78 70 - 99 mg/dL   BUN 21 6 - 23 mg/dL   Creatinine, Ser 0.56 0.50 - 1.35 mg/dL   Calcium 8.9 8.4 - 10.5 mg/dL   Total Protein 6.4 6.0 - 8.3 g/dL   Albumin 3.5 3.5 - 5.2 g/dL   AST 56 (H) 0 - 37 U/L   ALT 89 (H) 0 - 53 U/L   Alkaline Phosphatase 149 (H) 39 - 117 U/L   Total Bilirubin 1.2 0.3 - 1.2 mg/dL   GFR calc non Af Amer >90 >90 mL/min   GFR calc Af Amer >90 >90 mL/min   Anion gap 14 5 - 15  I-Stat CG4 Lactic Acid, ED  Result Value Ref Range   Lactic Acid, Venous 5.12 (HH) 0.5 - 2.0 mmol/L   Comment NOTIFIED PHYSICIAN     Imaging Review Dg Chest Port 1  View  04/12/2014   CLINICAL DATA:  Fever and shortness of breath.  EXAM: PORTABLE CHEST - 1 VIEW  COMPARISON:  03/28/2014  FINDINGS: Tip of the right chest port remains in the SVC. Lung volumes are low. There is unchanged linear atelectasis/scar at the left lung base. Right lung is grossly clear. Heart size is normal. No pleural effusion or pneumothorax. Extensive spinal fusion hardware in place.  IMPRESSION: Unchanged atelectasis or scarring at the left lung base. No acute process.   Electronically Signed   By: Jeb Levering M.D.   On: 04/12/2014 06:51   CRITICAL CARE Performed by: [email or web address] Total critical care time: 60 minutes Critical care time was exclusive of separately billable procedures and treating other patients. Critical care was necessary to treat or prevent imminent or life-threatening deterioration. Critical care was time spent personally by me on the following activities: development of treatment plan with patient and/or surrogate as well as nursing, discussions with consultants, evaluation of patient's response to treatment, examination of patient, obtaining history from patient or surrogate, ordering and performing treatments  and interventions, ordering and review of laboratory studies, ordering and review of radiographic studies, pulse oximetry and re-evaluation of patient's condition.   MDM   Final diagnoses:  Fever    Level to sepsis with fever, tachycardia, and altered mentation. No obvious source for infection so he is started on sepsis protocol with antibiotics for undetermined source in penicillin allergic patient. Old records are reviewed confirming hospitalizations for pancytopenia and for sepsis without source being identified. Of note, he had a CBC done yesterday which did show an absolute neutrophil count of greater than 800, so he does not have to be treated as a neutropenic septic patient.  WBC is actual come back very high, probably reflecting administration of  Neupogen yesterday. Platelet count has increased slightly over baseline. Mild elevation of transaminases are unchanged. Chest x-ray shows no evidence of pneumonia. Lactic acid level is come back elevated at 5 and will need to be repeated following completion of goal-directed therapy. Case is discussed with Dr. Alg[facility] who agrees to admit the patient. He'll be placed in stepdown unit.  [name], MD 35/57/32 2025

## 2014-04-12 NOTE — Progress Notes (Signed)
   04/12/14 1700  Clinical Encounter Type  Visited With Family;Patient and family together;Health care provider  Visit Type Initial;Spiritual support;Social support  Spiritual Encounters  Spiritual Needs Emotional  Stress Factors  Family Stress Factors Health changes   Chaplain was paged to visit with patient earlier this morning when the patient was in the ED. Chaplain spoke to the patient's foster mother/now legal guardian. Patient's mother wanted to talk to chaplain about an advanced directive. Patient said a nurse in a past hospital visit said the patient needed an advanced directive. Patient's mother has legal guardianship over the patient and was concerned about being able to make healthcare choices for the patient in a crisis situation. Chaplain listened to patient and assured her that her guardianship should be enough and that the patient would not be able to do an advanced directive due to lack of mental competency to make those choices. Chaplain will continue to provide emotional and spiritual support for patient and patient's family as needed. Gar Ponto, Chaplain  5:21 PM

## 2014-04-12 NOTE — ED Notes (Signed)
Legal guardian declines transport of ptg until air beg is in place on admitting room.  Lilia Pro, RN notified and reports bed will be ordered.

## 2014-04-12 NOTE — H&P (Addendum)
History and Physical  Jeffery [name] YWV:[id number] DOB: Dec 08, 1990 DOA: 04/12/2014  Referring physician: Dr. [name], EDP PCP: Marijean Bravo, MD  Outpatient Specialists:  1. Neurology: Dr. Wyline Copas. 2. Hematology: Dr. Burney Gauze.  Chief Complaint: Fever and altered mental status.  HPI: Jeffery [name] is a 24 y.o. male with history of cerebral palsy, spastic quadriplegia, wheelchair mobile, seizure disorder, has Port-A-Cath, PEG tube and baclofen pump, lives at home with foster parents, recent hospitalization 2/25/2/29 for neutropenic fever-workup including blood culture, urine culture, flu panel PCR and chest x-ray negative, pancytopenia attributed to Keppra which was discontinued, transfused 4 units PRBC and received Neupogen and had an episode of seizure the day prior to discharge, presented to the [facility] ED with low-grade fever and altered mental status. History obtained from patient's guardian/foster mom Ms. Jeffery [name] at bedside. As per family, at baseline patient is nonverbal but very interactive/communicative (smiles, blows kisses, please with computer games using his cheek and can make choices, watches TV). He has done well post recent discharge. He was seen by hematology on 3/10 and received Neulasta for leukopenia. He was in his usual state of health until approximately 4-7 PM yesterday when patient's foster dad noticed change in his mental status and patient was not "his usual self" and less responsive. He was noted to have a fever of 100.60F. No other complaints. No reported pain, dyspnea, cough, nausea, vomiting or bedsores. Family takes very good care of patient and strict aspiration precautions and skin care. He does have chronic 2-3 episodes of loose stools per day related to his 2 feeds and these are unchanged. No seizures reported since hospital discharge. Family called the hematologist answering service and was advised to come to the ED for further evaluation. In the  ED, MAXIMUM TEMPERATURE 100.9, tachycardic in the 130s, mildly elevated LFTs, WBC 26K, platelets 115, lactate >5 and chest x-ray without acute findings. Sepsis protocol was initiated. As per mom, patient has improved and is more alert and responsive.   Review of Systems: All systems reviewed and apart from history of presenting illness, are negative.  Past Medical History  Diagnosis Date  . CP (cerebral palsy), spastic, quadriplegic   . Epilepsy   . Osteoporosis   . Inguinal hernia   . Undescended testes   . Seasonal allergies   . IVH (intraventricular hemorrhage)     Grade IV  . Hip dislocation, bilateral   . Seizures   . Hx: UTI (urinary tract infection)   . Dysphagia   . Otitis media   . Retinopathy of prematurity   . 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 Neuromuscular scoliosis   . Osteoporosis   . Complex partial seizures   . Generalized convulsive epilepsy without mention of intractable epilepsy   . Epilepsy   . Sinus bradycardia     HR drops to 38-40 while sleeping  . Sleep apnea     BiPAP  . Blister of right heel     fluid filled; origin unknown  . Kidney stones     ?  Marland Kitchen Pneumonia      chronic pneumonia ,respitory failure dx Augest 2014   Past Surgical History  Procedure Laterality Date  . Excision of moles    . Peg placement      With multiple changes  . Gastrostomy tube, change / reposition  12/15/2010       . Button change  12/15/2010    Procedure: BUTTON CHANGE;  Surgeon:  Gatha Mayer, MD;  Location: [facility];  Service: Endoscopy;  Laterality: N/A;  . Peg placement  10/07/2011    Procedure: PERCUTANEOUS ENDOSCOPIC GASTROSTOMY (PEG) REPLACEMENT;  Surgeon: Lafayette Dragon, MD;  Location: [facility];  Service: Endoscopy;  Laterality: N/A;  Needs 18 F 2.5 button ordered-dl  . Inguinal hernia repair Bilateral 1992  . Retinopathy of prematurity surgery  1992  . Achilles tendon lengthening  12/1998  . Soft tissue releases  wrists and fingers  12/1998   . Intrathecal baclofen pump placement  07/25/2000  . Peg placement N/A 06/05/2012    Procedure: PERCUTANEOUS ENDOSCOPIC GASTROSTOMY (PEG) REPLACEMENT;  Surgeon: Lafayette Dragon, MD;  Location: [facility];  Service: Endoscopy;  Laterality: N/A;  button 20fr.2.5cm  . Eye surgery Bilateral     Retinal   . Back surgery      Harrington Rods in back needs to be log rolled  . Tendon repair Bilateral 09/05/2012    Procedure: LENGTHENING OF DIGITAL FLEXOR TENDONS BILTERAL HANDS;  Surgeon: Jolyn Nap, MD;  Location: [place];  Service: Orthopedics;  Laterality: Bilateral;  . Hand surgery Bilateral Aug. 2014  . Peg placement N/A 09/13/2012    Procedure: PERCUTANEOUS ENDOSCOPIC GASTROSTOMY (PEG) REPLACEMENT;  Surgeon: Lafayette Dragon, MD;  Location: [facility];  Service: Endoscopy;  Laterality: N/A;  . Flexible sigmoidoscopy N/A 10/30/2012    Procedure: FLEXIBLE SIGMOIDOSCOPY;  Surgeon: Cleotis Nipper, MD;  Location: [facility];  Service: Endoscopy;  Laterality: N/A;  . Peg placement N/A 11/15/2012    Procedure: PERCUTANEOUS ENDOSCOPIC GASTROSTOMY (PEG) REPLACEMENT;  Surgeon: Jeryl Columbia, MD;  Location: [facility];  Service: Endoscopy;  Laterality: N/A;   Social History:  reports that he has never smoked. He has never used smokeless tobacco. He reports that he does not drink alcohol or use illicit drugs. Single. Lives at home with foster parents. Mobile by wheelchair.  Allergies  Allergen Reactions  . Adhesive [Tape]     Rips skin off  . Depakote [Divalproex Sodium]     Causes pancreatitis   . Keppra [Levetiracetam] Other (See Comments)    Bone marrow suppression  . Amoxicillin-Pot Clavulanate Rash    Family History  Problem Relation Age of Onset  . Adopted: Yes   no known family history secondary to adopted status. Patient does have a healthy twin sister.  Prior to Admission medications   Medication Sig Start Date End Date Taking? Authorizing Provider  Acetaminophen (TYLENOL PO)  Place 650 mg into feeding tube every 6 (six) hours as needed (fever).   Yes Historical Provider, MD  albuterol (PROVENTIL) (2.5 MG/3ML) 0.083% nebulizer solution Take 2.5 mg by nebulization. 1 ampule twice daily and as needed (making 4 doses daily) 10/11/12  Yes Elsie Stain, MD  Ascorbic Acid (VITAMIN C) 500 MG/5ML LIQD 300 mg by PEG Tube route every morning.    Yes Historical Provider, MD  baclofen (GABLOFEN) 40000 MCG/20ML SOLN 385.2 mcg by Intrathecal route continuous.  05/04/12  Yes Jodi Geralds, MD  calcium carbonate, dosed in mg elemental calcium, 1250 MG/5ML 1,250 mg of elemental calcium by PEG Tube route 3 (three) times daily. 5 cc via peg tube   Yes Historical Provider, MD  cloBAZam (ONFI) 2.5 MG/ML solution Place 2 mLs (5 mg total) into feeding tube 2 (two) times daily. 04/01/14  Yes Shanker Kristeen Mans, MD  diazepam (DIASTAT ACUDIAL) 10 MG GEL Place 12.5 mg rectally as needed for seizure.    Yes Historical Provider, MD  dicyclomine (BENTYL) 10 MG/5ML syrup Place into feeding tube every 8 (eight) hours as needed. Not to exceed 5 doses in 1 wk   Yes Historical Provider, MD  diphenoxylate-atropine (LOMOTIL) 2.5-0.025 MG/5ML liquid Place into feeding tube 4 (four) times daily as needed for diarrhea or loose stools. 1-2 tsp via feeding tube q6h prn loose stools   Yes Historical Provider, MD  folic acid (FOLVITE) 1 MG tablet Take 1 mg by mouth daily.   Yes Historical Provider, MD  gabapentin (NEURONTIN) 250 MG/5ML solution Take 18 mL 3 times daily Patient taking differently: Place 825 mg into feeding tube 3 (three) times daily. Take 18 mL 3 times daily 12/18/13  Yes Jodi Geralds, MD  ibuprofen (ADVIL,MOTRIN) 100 MG/5ML suspension Place 400 mg into feeding tube every 6 (six) hours as needed for fever (pain).   Yes Historical Provider, MD  lansoprazole (PREVACID SOLUTAB) 30 MG disintegrating tablet Take 30 mg by mouth 2 (two) times daily. 730am 09/07/12  Yes Lafayette Dragon, MD  Multiple  Vitamins-Minerals (MULTIVITAMIN) LIQD Take 10 mLs by mouth daily.   Yes Historical Provider, MD  Multiple Vitamins-Minerals (ZINC PO) 5 mLs by PEG Tube route daily. 244 mg / 5 mL zinc solution   Yes Historical Provider, MD  Nutritional Supplements (PROMOD PO) Take 30 mLs by mouth 2 (two) times daily. Noon and 10pm   Yes Historical Provider, MD  Nutritional Supplements (TWOCAL HN) LIQD Take 237 mLs by mouth. T.3 can at 46 cc hour x 12 hours - water 172 cc pre and post each and extra 172 bid.   Yes Historical Provider, MD  OVER THE COUNTER MEDICATION Apply 1 application topically as needed (after diaper changes). Balmex and Bag Balm paste made and applied after diaper changes   Yes Historical Provider, MD  OXYGEN-HELIUM IN Inhale into the lungs. Oxygen PRN to keep O2 Sat at 90%   Yes Historical Provider, MD  Pseudoephedrine HCl (SUDAFED CHILDRENS) 15 MG/5ML LIQD Give 90 mg by tube every 6 (six) hours.    Yes Historical Provider, MD  simethicone (MYLICON) 272 MG chewable tablet Chew 125 mg by mouth every 6 (six) hours as needed for flatulence.   Yes Historical Provider, MD  sucralfate (CARAFATE) 1 GM/10ML suspension Place 1 g into feeding tube 4 (four) times daily. 7am, 130pm, 5pm, 8pm   Yes Historical Provider, MD  Water For Irrigation, Sterile (FREE WATER) SOLN Place 172 mLs into feeding tube 4 (four) times daily.   Yes Historical Provider, MD  zinc oxide (BALMEX) 11.3 % CREA cream Apply 1 application topically 5 (five) times daily. With each diaper change.   Yes Historical Provider, MD  albuterol (PROVENTIL HFA;VENTOLIN HFA) 108 (90 BASE) MCG/ACT inhaler Inhale 2 puffs into the lungs every 4 (four) hours as needed for shortness of breath.    Historical Provider, MD  furosemide (LASIX) 10 MG/ML solution Place 20 mg into feeding tube daily as needed for fluid.     Historical Provider, MD  ketoconazole (NIZORAL) 2 % cream Apply 1 application topically 2 (two) times daily as needed for irritation.     Historical Provider, MD  midazolam (VERSED) 5 MG/ML injection Place 2 mLs (10 mg total) into the nose once. Draw up 45ml in 2 syringes. Remove blue vial access device. Attach syringe to nasal atomizer for intranasal administration. Give 74ml in right nostril x 2 for seizures lasting 2 minutes or longer or for repetitive seizures in a short period of time. 11/05/13   Otila Kluver  P Goodpasture, NP  mupirocin ointment (BACTROBAN) 2 % Apply 1 application topically as needed (to G-T site). 09/07/12   Lafayette Dragon, MD  sodium phosphate (FLEET) enema Place 1 enema rectally. follow package directions PRN    Historical Provider, MD   Physical Exam: Filed Vitals:   04/12/14 0725 04/12/14 0745 04/12/14 0815 04/12/14 0815  BP:  168/79 130/70   Pulse:  130 132   Temp: 100.9 F (38.3 C)     TempSrc: Rectal     Resp:  21 25   Height:    4\' 2"  (1.27 m)  Weight:    58.06 kg (128 lb)  SpO2:  95% 93%      General exam: Small built and moderately nourished pleasant young male, lying comfortably propped up on the gurney in no obvious distress.  Head, eyes and ENT: Nontraumatic and normocephalic. Pupils equally reacting to light and accommodation. Oral mucosa dry. Patient not fully cooperative with oral cavity and eye exam.  Neck: Supple. No JVD, carotid bruit or thyromegaly.  Lymphatics: No lymphadenopathy.  Respiratory system: Clear to auscultation. No increased work of breathing. Porta cath right upper anterior chest: Site looks clean and dry without evidence of infection.  Cardiovascular system: S1 and S2 heard, regular tachycardic. No JVD, murmurs, gallops, clicks or pedal edema. Telemetry: Sinus tachycardia in the 120s.  Gastrointestinal system: Abdomen is nondistended, soft and nontender. Normal bowel sounds heard. No organomegaly or masses appreciated. PEG tube site and baclofen pump site intact without evidence of infection.  Central nervous system: Alert, looks around, nonverbal and does not follow  instructions/not oriented. No obvious cranial nerve deficits.  Extremities: All extremities with decreased bulk, spastic with contractures of hands and feet. Surgical scars on both forearms and legs from contracture release surgery. Peripheral pulses symmetrically felt.   Skin: No rashes or acute findings.  Musculoskeletal system: Negative exam.  Psychiatry: Pleasant and cooperative.   Labs on Admission:  Basic Metabolic Panel:  Recent Labs Lab 04/11/14 1347 04/12/14 0650  NA 146* 145  K 3.8 3.7  CL 101 105  CO2 32 26  GLUCOSE 101 78  BUN 19 21  CREATININE 0.5* 0.56  CALCIUM 10.0 8.9   Liver Function Tests:  Recent Labs Lab 04/11/14 1347 04/12/14 0650  AST 41* 56*  ALT 71* 89*  ALKPHOS 140* 149*  BILITOT 1.20 1.2  PROT 7.7 6.4  ALBUMIN  --  3.5   No results for input(s): LIPASE, AMYLASE in the last 168 hours. No results for input(s): AMMONIA in the last 168 hours. CBC:  Recent Labs Lab 04/05/14 1159 04/11/14 1312 04/12/14 0650  WBC 3.5* 2.3* 26.0*  NEUTROABS 2.0 1.0* 23.2*  HGB 13.5 14.0 13.1  HCT 41.9 44.3 41.7  MCV 93 94 95.2  PLT 93* 146 Platelet count confirmed by slide estimate 115*   Cardiac Enzymes: No results for input(s): CKTOTAL, CKMB, CKMBINDEX, TROPONINI in the last 168 hours.  BNP (last 3 results) No results for input(s): PROBNP in the last 8760 hours. CBG: No results for input(s): GLUCAP in the last 168 hours.  Radiological Exams on Admission: Dg Chest Port 1 View  04/12/2014   CLINICAL DATA:  Fever and shortness of breath.  EXAM: PORTABLE CHEST - 1 VIEW  COMPARISON:  03/28/2014  FINDINGS: Tip of the right chest port remains in the SVC. Lung volumes are low. There is unchanged linear atelectasis/scar at the left lung base. Right lung is grossly clear. Heart size is normal. No pleural  effusion or pneumothorax. Extensive spinal fusion hardware in place.  IMPRESSION: Unchanged atelectasis or scarring at the left lung base. No acute process.    Electronically Signed   By: Jeb Levering M.D.   On: 04/12/2014 06:51    EKG: None seen in Epic for today.  Assessment/Plan Principal Problem:   Sepsis Active Problems:   Congenital spastic quadriplegia   Dysphagia   Generalized convulsive epilepsy   OSA on CPAP   Portacath in place   History of pancytopenia   Leukocytosis-secondary to Neulasta.   1. Sepsis: Sepsis physiology present on admission (temperature 100.87F, heart rate in 130s, RR >20 & WBC 26K). No clear focus of sepsis. Chest x-ray negative. Blood cultures 2 drawn. Urine microscopy to be sent. Recent hospitalization for febrile illness where extensive workup was negative. Meningitis seems less likely- no neck stiffness & patient improving already with aggressive IVF. Sepsis protocol initiated and patient has received IV fluid bolus, IV vancomycin and levofloxacin. Infectious disease/Dr. Baxter Flattery consulted and recommended continuing vancomycin and adding Zosyn. Patient has tolerated Zosyn and cefepime during prior admissions. Discontinued levofloxacin and aztreonam ordered by EDP. 2. Pancytopenia: This was noticed during 2 prior admissions in early February and late March. Hematology following and felt that this was related to [place] which was discontinued. Peripheral smear apparently unremarkable. Hemoglobin normal. Platelet counts have improved. Marked leukocytosis is most likely secondary to Neulasta received 3/10 and current sepsis picture. Requested hematology/Dr. Marin Olp to consult. Follow CBC in a.m. 3. Acute encephalopathy: Secondary to acute illness/sepsis complicating underlying chronic mental status change related to CPAP. Improving with above treatment. Closely monitor. 4. Seizure disorder: Continue clobazam and when necessary rectal Valium. Alerted Dr. Gaynell Face of admission - call again if any assistance needed. 5. OSA: Continue nightly C Pap 6. History of cerebral palsy/congenital spastic quadriplegia/baclofen  pump: Baclofen pump refilled 03/28/14 7. Dysphagia,s/p PEG tube: Nutritionist consulted for management.  Code Status: Full  Family Communication: Discussed with patient's foster mom/legal guardian Ms. [name] at bedside Disposition Plan: Home when medically stable- possibly in next 3-4 days.   Time spent: 70 minutes.  Vernell Leep, MD, FACP, FHM. [facility] Pager [phone]  If 7PM-7AM, please contact night-coverage [email or web address] Password [facility] 04/12/2014, 9:24 AM

## 2014-04-12 NOTE — Consult Note (Signed)
[facility]  Total days of antibiotics 1        Day 1 levo        Day 1 piptazo        Day 1vanco       Reason for Consult: sepsis    Referring Physician: [email or web address]  Principal Problem:   Sepsis Active Problems:   Congenital spastic quadriplegia   Dysphagia   Generalized convulsive epilepsy   OSA on CPAP   Portacath in place   History of pancytopenia   Leukocytosis-secondary to Neulasta.    HPI: Jeffery [name] is a 24 y.o. male who goes by "Jeffery [name]" has CP, c/b epilepsy,  Spasticity on baclofen pump, s/p portacath, peg tube, who is wheelchair mobile, cared for by foster parents(mom is RN), who had recent hospitalization at end of February for neutropenic fever, thought to be due [place]. He is followed by Dr. Rozann Lesches  . He was recently switched to [facility], clobazam,(not yet therapeutic per mom's report) and receiving GCSF injection to minimize neutropenia, which he received yesterday on 3/10. The family reports that he had been his usual health, until 7:30 pm last night where he was less interactive and having fevers of 100.7 as well as tachycardia. This history is given to me by his father. Mom/dad took vitals, hr stayed in the 140s in addition to fevers over night.  No reported pain, dyspnea, cough, nausea, vomiting or bedsores, no decrease in urine output, No seizures reported since hospital discharge.  In the ED, Jeffery [name] remained febrile, 100.9, tachycardic in the 130s, mildly elevated LFTs, WBC 26K,  lactate >5 and chest x-ray without acute findings. He was started on vancomycin, levofloxacin due to penicillin allergy, but then changed to piptazo (since he had tolerated in the past, as well as given cefepime on prior admit without difficulty). Father reports slightly improved since no longer having fevers since admit. Family is concerned that they have brought in his AED from home, onfi, and the patient has not received it yet.    Past Medical History  Diagnosis  Date  . CP (cerebral palsy), spastic, quadriplegic   . Epilepsy   . Osteoporosis   . Inguinal hernia   . Undescended testes   . Seasonal allergies   . IVH (intraventricular hemorrhage)     Grade IV  . Hip dislocation, bilateral   . Seizures   . Hx: UTI (urinary tract infection)   . Dysphagia   . Otitis media   . Retinopathy of prematurity   . 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 Neuromuscular scoliosis   . Osteoporosis   . Complex partial seizures   . Generalized convulsive epilepsy without mention of intractable epilepsy   . Epilepsy   . Sinus bradycardia     HR drops to 38-40 while sleeping  . Sleep apnea     BiPAP  . Blister of right heel     fluid filled; origin unknown  . Kidney stones     ?  Marland Kitchen Pneumonia      chronic pneumonia ,respitory failure dx Augest 2014    Allergies:  Allergies  Allergen Reactions  . Adhesive [Tape]     Rips skin off  . Depakote [Divalproex Sodium]     Causes pancreatitis   . Keppra [Levetiracetam] Other (See Comments)    Bone marrow suppression  . Amoxicillin-Pot Clavulanate Rash    MEDICATIONS: . calcium carbonate (dosed in mg elemental calcium)  1,250 mg  of elemental calcium Per Tube TID  . cloBAZam  5 mg Per Tube BID  . enoxaparin (LOVENOX) injection  30 mg Subcutaneous Q24H  . folic acid  1 mg Per Tube Daily  . free water  172 mL Per Tube QID  . gabapentin  825 mg Per Tube TID  . lansoprazole  30 mg Oral BID  . multivitamin  10 mL Per Tube Daily  . piperacillin-tazobactam (ZOSYN)  IV  3.375 g Intravenous Q8H  . PROMOD  30 mL Tube BID  . Pseudoephedrine HCl  90 mg Tube Q6H  . sodium chloride  3 mL Intravenous Q12H  . sucralfate  1 g Per Tube QID  . vancomycin  750 mg Intravenous Q8H  . [START ON 04/13/2014] Vitamin C  300 mg PEG Tube q morning - 10a  . zinc oxide   Topical 5 X Daily    History  Substance Use Topics  . Smoking status: Never Smoker   . Smokeless tobacco: Never Used     Comment: no H/S smoking    . Alcohol Use: No  baseline patient is nonverbal but very interactive/communicative (smiles, blows kisses, please with computer games using his cheek and can make choices, watches TV).   Family History  Problem Relation Age of Onset  . Adopted: Yes    Review of Systems -  Fever, altered mental status as reported per family, pt unable to verbalize  OBJECTIVE: Temp:  [99.8 F (37.7 C)-100.9 F (38.3 C)] 99.8 F (37.7 C) (03/11 1600) Pulse Rate:  [121-138] 121 (03/11 1600) Resp:  [16-28] 17 (03/11 1600) BP: (107-168)/(64-87) 125/76 mmHg (03/11 1600) SpO2:  [91 %-97 %] 96 % (03/11 1600) Weight:  [128 lb (58.06 kg)] 128 lb (58.06 kg) (03/11 0815)    Gen: young male, younger than stated age, pale complexion, sleeping, propped up in bed HEENT: Nontraumatic and normocephalic. Pupils equally reacting to light and accommodation. Father assisted in oral care to have Jeffery [name] open his mouth, moist mucous membranes, no thrush Neck = supple, no LAD Lymph = no LAD pulm = CTAB, no w/c/r Cors=  S1 and S2 heard No JVD, murmurs, gallops, clicks or pedal edema.  GI = Abdomen is nondistended, soft and nontender. Normal bowel sounds heard. No organomegaly or masses appreciated. PEG tube site and baclofen pump site intact without evidence of infection. Neuro = Alert, looks around, nonverbal, briefly and does not follow instructions/not oriented. No obvious cranial nerve deficits. Ext = contracture of All extremities with decreased muscle bulk, spastic in lower extremities. Surgical scars on both forearms and legs from contracture release surgery. Peripheral pulses symmetrically felt.  Skin = No rashes or acute findings.Marland Kitchen   LABS: Results for orders placed or performed during the hospital encounter of 04/12/14 (from the past 48 hour(s))  CBC WITH DIFFERENTIAL     Status: Abnormal   Collection Time: 04/12/14  6:50 AM  Result Value Ref Range   WBC 26.0 (H) 4.0 - 10.5 K/uL    Comment: REPEATED TO VERIFY    RBC 4.38 4.22 - 5.81 MIL/uL   Hemoglobin 13.1 13.0 - 17.0 g/dL   HCT 41.7 39.0 - 52.0 %   MCV 95.2 78.0 - 100.0 fL   MCH 29.9 26.0 - 34.0 pg   MCHC 31.4 30.0 - 36.0 g/dL   RDW 17.4 (H) 11.5 - 15.5 %   Platelets 115 (L) 150 - 400 K/uL    Comment: PLATELET COUNT CONFIRMED BY SMEAR   Neutrophils Relative % 89 (  H) 43 - 77 %   Lymphocytes Relative 4 (L) 12 - 46 %   Monocytes Relative 7 3 - 12 %   Eosinophils Relative 0 0 - 5 %   Basophils Relative 0 0 - 1 %   Neutro Abs 23.2 (H) 1.7 - 7.7 K/uL   Lymphs Abs 1.0 0.7 - 4.0 K/uL   Monocytes Absolute 1.8 (H) 0.1 - 1.0 K/uL   Eosinophils Absolute 0.0 0.0 - 0.7 K/uL   Basophils Absolute 0.0 0.0 - 0.1 K/uL  Comprehensive metabolic panel     Status: Abnormal   Collection Time: 04/12/14  6:50 AM  Result Value Ref Range   Sodium 145 135 - 145 mmol/L   Potassium 3.7 3.5 - 5.1 mmol/L   Chloride 105 96 - 112 mmol/L   CO2 26 19 - 32 mmol/L   Glucose, Bld 78 70 - 99 mg/dL   BUN 21 6 - 23 mg/dL   Creatinine, Ser 0.56 0.50 - 1.35 mg/dL   Calcium 8.9 8.4 - 10.5 mg/dL   Total Protein 6.4 6.0 - 8.3 g/dL   Albumin 3.5 3.5 - 5.2 g/dL   AST 56 (H) 0 - 37 U/L   ALT 89 (H) 0 - 53 U/L   Alkaline Phosphatase 149 (H) 39 - 117 U/L   Total Bilirubin 1.2 0.3 - 1.2 mg/dL   GFR calc non Af Amer >90 >90 mL/min   GFR calc Af Amer >90 >90 mL/min    Comment: (NOTE) The eGFR has been calculated using the CKD EPI equation. This calculation has not been validated in all clinical situations. eGFR's persistently <90 mL/min signify possible Chronic Kidney Disease.    Anion gap 14 5 - 15  I-Stat CG4 Lactic Acid, ED     Status: Abnormal   Collection Time: 04/12/14  7:10 AM  Result Value Ref Range   Lactic Acid, Venous 5.12 (HH) 0.5 - 2.0 mmol/L   Comment NOTIFIED PHYSICIAN   Urinalysis with microscopic     Status: Abnormal   Collection Time: 04/12/14  7:18 AM  Result Value Ref Range   Color, Urine AMBER (A) YELLOW    Comment: BIOCHEMICALS MAY BE AFFECTED BY  COLOR   APPearance CLEAR CLEAR   Specific Gravity, Urine 1.031 (H) 1.005 - 1.030   pH 6.0 5.0 - 8.0   Glucose, UA NEGATIVE NEGATIVE mg/dL   Hgb urine dipstick NEGATIVE NEGATIVE   Bilirubin Urine NEGATIVE NEGATIVE   Ketones, ur NEGATIVE NEGATIVE mg/dL   Protein, ur >300 (A) NEGATIVE mg/dL   Urobilinogen, UA 0.2 0.0 - 1.0 mg/dL   Nitrite NEGATIVE NEGATIVE   Leukocytes, UA NEGATIVE NEGATIVE  Urine microscopic-add on     Status: None   Collection Time: 04/12/14  7:18 AM  Result Value Ref Range   WBC, UA 0-2 <3 WBC/hpf   Bacteria, UA RARE RARE   Urine-Other MUCOUS PRESENT   Procalcitonin     Status: None   Collection Time: 04/12/14 10:11 AM  Result Value Ref Range   Procalcitonin 0.31 ng/mL    Comment:        Interpretation: PCT (Procalcitonin) <= 0.5 ng/mL: Systemic infection (sepsis) is not likely. Local bacterial infection is possible. (NOTE)         ICU PCT Algorithm               Non ICU PCT Algorithm    ----------------------------     ------------------------------         PCT < 0.25 ng/mL  PCT < 0.1 ng/mL     Stopping of antibiotics            Stopping of antibiotics       strongly encouraged.               strongly encouraged.    ----------------------------     ------------------------------       PCT level decrease by               PCT < 0.25 ng/mL       >= 80% from peak PCT       OR PCT 0.25 - 0.5 ng/mL          Stopping of antibiotics                                             encouraged.     Stopping of antibiotics           encouraged.    ----------------------------     ------------------------------       PCT level decrease by              PCT >= 0.25 ng/mL       < 80% from peak PCT        AND PCT >= 0.5 ng/mL            Continuin g antibiotics                                              encouraged.       Continuing antibiotics            encouraged.    ----------------------------     ------------------------------     PCT level increase  compared          PCT > 0.5 ng/mL         with peak PCT AND          PCT >= 0.5 ng/mL             Escalation of antibiotics                                          strongly encouraged.      Escalation of antibiotics        strongly encouraged.   Protime-INR     Status: None   Collection Time: 04/12/14 10:11 AM  Result Value Ref Range   Prothrombin Time 14.1 11.6 - 15.2 seconds   INR 1.08 0.00 - 1.49  APTT     Status: None   Collection Time: 04/12/14 10:11 AM  Result Value Ref Range   aPTT 31 24 - 37 seconds  I-Stat CG4 Lactic Acid, ED (not at [facility])     Status: Abnormal   Collection Time: 04/12/14 10:21 AM  Result Value Ref Range   Lactic Acid, Venous 4.80 (HH) 0.5 - 2.0 mmol/L   Comment NOTIFIED PHYSICIAN     MICRO: 3/11 blood cx pending 3/11 urine cx pending IMAGING: Dg Chest Port 1 View  04/12/2014   CLINICAL DATA:  Fever and shortness of breath.  EXAM: PORTABLE CHEST - 1 VIEW  COMPARISON:  03/28/2014  FINDINGS: Tip of the right chest port remains in the SVC. Lung volumes are low. There is unchanged linear atelectasis/scar at the left lung base. Right lung is grossly clear. Heart size is normal. No pleural effusion or pneumothorax. Extensive spinal fusion hardware in place.  IMPRESSION: Unchanged atelectasis or scarring at the left lung base. No acute process.   Electronically Signed   By: Jeb Levering M.D.   On: 04/12/2014 06:51   Assessment/Plan: 24 yo M with CP and epilepsy, previously on keppra but had AE of bone marrow suppression, recently transitioned to clobazam. Found to have AMS and fevers, admitted for sepsis  - continue on broad spectrum, vancomycin and piptazo for now. Unclear source since cxr is fine, ua is clean. Presentation is so abrupt that makes you think of GNR infection. portacath is sans erythema. Source of infection is unclear - clobazam can cause ams but not having rash to think it is drug related - for now continue on clobazam, consider getting  neurology to see if he has seizure while hospitalized. If he is not having improvement in his mental status tomorrow, would consider getting EEG to ensure not having NCSE. - await for culture results to return to guide therapy  [facility] [phone]

## 2014-04-13 DIAGNOSIS — R6510 Systemic inflammatory response syndrome (SIRS) of non-infectious origin without acute organ dysfunction: Secondary | ICD-10-CM

## 2014-04-13 DIAGNOSIS — D61818 Other pancytopenia: Secondary | ICD-10-CM | POA: Insufficient documentation

## 2014-04-13 DIAGNOSIS — G808 Other cerebral palsy: Secondary | ICD-10-CM

## 2014-04-13 LAB — COMPREHENSIVE METABOLIC PANEL
ALBUMIN: 3 g/dL — AB (ref 3.5–5.2)
ALT: 53 U/L (ref 0–53)
AST: 27 U/L (ref 0–37)
Alkaline Phosphatase: 137 U/L — ABNORMAL HIGH (ref 39–117)
Anion gap: 13 (ref 5–15)
BILIRUBIN TOTAL: 1.9 mg/dL — AB (ref 0.3–1.2)
BUN: 8 mg/dL (ref 6–23)
CHLORIDE: 99 mmol/L (ref 96–112)
CO2: 26 mmol/L (ref 19–32)
CREATININE: 0.62 mg/dL (ref 0.50–1.35)
Calcium: 8.8 mg/dL (ref 8.4–10.5)
GFR calc Af Amer: >90 mL/min (ref >90)
GLUCOSE: 88 mg/dL (ref 70–99)
POTASSIUM: 3.5 mmol/L (ref 3.5–5.1)
Sodium: 138 mmol/L (ref 135–145)
Total Protein: 5.6 g/dL — ABNORMAL LOW (ref 6.0–8.3)

## 2014-04-13 LAB — GLUCOSE, CAPILLARY
GLUCOSE-CAPILLARY: 100 mg/dL — AB (ref 70–99)
GLUCOSE-CAPILLARY: 95 mg/dL (ref 70–99)
GLUCOSE-CAPILLARY: 83 mg/dL (ref 70–99)
GLUCOSE-CAPILLARY: 89 mg/dL (ref 70–99)
Glucose-Capillary: 82 mg/dL (ref 70–99)
Glucose-Capillary: 89 mg/dL (ref 70–99)

## 2014-04-13 LAB — CBC
HCT: 32.5 % — ABNORMAL LOW (ref 39.0–52.0)
Hemoglobin: 10.4 g/dL — ABNORMAL LOW (ref 13.0–17.0)
MCH: 30.1 pg (ref 26.0–34.0)
MCHC: 32 g/dL (ref 30.0–36.0)
MCV: 93.9 fL (ref 78.0–100.0)
Platelets: 93 10*3/uL — ABNORMAL LOW (ref 150–400)
RBC: 3.46 MIL/uL — ABNORMAL LOW (ref 4.22–5.81)
RDW: 17.6 % — ABNORMAL HIGH (ref 11.5–15.5)
WBC: 28.7 10*3/uL — AB (ref 4.0–10.5)

## 2014-04-13 LAB — URINE CULTURE
Colony Count: NO GROWTH
Culture: NO GROWTH

## 2014-04-13 MED ORDER — NONFORMULARY OR COMPOUNDED ITEM
Status: AC
  Administered 2014-04-13: 46 mL/h via JEJUNOSTOMY

## 2014-04-13 MED ORDER — FLUCONAZOLE 40 MG/ML PO SUSR
100.0000 mg | Freq: Every day | ORAL | Status: DC
Start: 2014-04-13 — End: 2014-04-14
  Administered 2014-04-13: 100 mg via ORAL
  Filled 2014-04-13 (×2): qty 2.5

## 2014-04-13 MED ORDER — PRO-STAT SUGAR FREE PO LIQD
30.0000 mL | Freq: Two times a day (BID) | ORAL | Status: DC
  Administered 2014-04-14 (×2): 30 mL
  Filled 2014-04-13 (×3): qty 30

## 2014-04-13 MED ORDER — PSEUDOEPHEDRINE HCL 30 MG/5ML PO LIQD
90.0000 mg | Freq: Four times a day (QID) | ORAL | Status: DC
  Administered 2014-04-13 – 2014-04-14 (×4): 90 mg
  Filled 2014-04-13 (×9): qty 15

## 2014-04-13 MED ORDER — FREE WATER
220.0000 mL | Freq: Four times a day (QID) | Status: DC
  Administered 2014-04-14: 220 mL

## 2014-04-13 MED ORDER — NON FORMULARY: Status: DC

## 2014-04-13 NOTE — Progress Notes (Signed)
Patient's mother requesting he have fluconazole 2.5cc daily and ketoconazole cream prn for peri area, states she has to give him this when he is at home and goes on Vancomycin. Walden Field NP paged with request. Zachary George RN

## 2014-04-13 NOTE — Progress Notes (Signed)
PROGRESS NOTE    Jeffery [name] ZDG:[id number] DOB: 1990/07/09 DOA: 04/12/2014 PCP: Marijean Bravo, MD Outpatient Specialists:  1. Neurology: Dr. Wyline Copas. 2. Hematology: Dr. Burney Gauze.   HPI/Brief narrative 24 y.o. male with history of cerebral palsy, spastic quadriplegia, wheelchair mobile, seizure disorder, has Port-A-Cath, PEG tube and baclofen pump, lives at home with foster parents, recent hospitalization 2/25/2/29 for neutropenic fever-workup including blood culture, urine culture, flu panel PCR and chest x-ray negative, pancytopenia attributed to Keppra which was discontinued, transfused 4 units PRBC and received Neupogen and had an episode of seizure the day prior to discharge, presented to the [facility] ED with low-grade fever and altered mental status. He was in his usual state of health on 04/11/14. Received Neulasta and couple of hours later developed low-grade fever, sweating and altered mental status. Admitted with SIRS/suspected sepsis picture and started on broad spectrum antibiotics. Mental status returned to baseline. Currently felt that his presentation may be a reaction to Neulasta. Discontinued antibiotics and monitor overnight and if no decline, DC home in a.m.    Assessment/Plan:  1. SIRS: Initially treated for possible sepsis of an determined origin with broad-spectrum antibiotics including IV vancomycin and Zosyn. Chest x-ray negative. Urine microscopy negative. As discussed with foster dad this morning, patient was well until he returned after Neulasta shot. Oncology input reviewed. It's possible that his presentation was secondary to Neulasta reaction rather than infectious etiology. As discussed with ID, DC all antibiotics and monitor overnight. If no decline, DC home in a.m. Follow out standing cultures. 2. Pancytopenia: Noticed during 2 prior hospitalizations in early February in late March. Hematology felt that this was related to [place] which was  discontinued. Peripheral smear unremarkable. Leukocytosis secondary to Neulasta. Hemoglobin and platelet count have dropped since admission-follow CBC in a.m.  3. Acute encephalopathy: Secondary to problem #1. As per family, mental status changes have resolved. 4. Seizure disorder: Continue clobazam and when necessary rectal Valium. As per dad, patient has infrequent brief seizure episodes even at home and had one this morning. Monitor closely. If has more seizures, will consult neurology. 5. OSA: Continue nightly C Pap. 6. History of cerebral palsy/congenital spastic quadriplegia/baclofen pump: Baclofen pump refilled 03/28/14 7. Dysphagia, s/p PEG tube: Dietitian was consulted from the ED on admission but unfortunately orders were never released and patient did not start tube feeds in a timely manner yesterday. Patient had a hypoglycemic spell last evening. Resume tube feeding and reduce IV D5.   Code Status: Partial Code Family Communication: Discussed with Dad at bedside. Disposition Plan: DC home possibly 3/13   Consultants:  ID  Procedures:  Nightly CPAP  Antibiotics:  IV Zosyn- DC'ed  IV Vancomycin- DC'ed   Subjective: Patient nonverbal. As per family at bedside, mental status almost back to baseline. Patient had a brief ~ 30 sec seizure as evidenced by blinking eyelids and turning of head to the right. Resolved spontaneously.   Objective: Filed Vitals:   04/13/14 0830 04/13/14 0835 04/13/14 0902 04/13/14 1230  BP: 114/73 114/66  102/58  Pulse: 100 106  101  Temp:  98.1 F (36.7 C)    TempSrc:  Axillary    Resp:      Height:      Weight:      SpO2: 96% 95% 99% 100%   temperature 98.14F.  Intake/Output Summary (Last 24 hours) at 04/13/14 1416 Last data filed at 04/13/14 1400  Gross per 24 hour  Intake   2981 ml  Output  0 ml  Net   2981 ml   Filed Weights   04/12/14 0815 04/13/14 0400  Weight: 58.06 kg (128 lb) 57.607 kg (127 lb)     Exam:  General  exam: Pleasant young male sitting up comfortably on chair. Nonverbal. Respiratory system: Clear. No increased work of breathing. Cardiovascular system: S1 & S2 heard, RRR. No JVD, murmurs, gallops, clicks or pedal edema. telemetry: SR 90-ST 100 Gastrointestinal system: Abdomen is nondistended, soft and nontender. Normal bowel sounds heard. PEG tube intact. Right mid quadrant baclofen pump intact  Central nervous system: Sleeping this morning. No focal neurological deficits. Extremities: Decreased bulk and 0 x 5 power in all limbs.   Data Reviewed: Basic Metabolic Panel:  Recent Labs Lab 04/11/14 1347 04/12/14 0650 04/13/14 0545  NA 146* 145 138  K 3.8 3.7 3.5  CL 101 105 99  CO2 32 26 26  GLUCOSE 101 78 88  BUN 19 21 8   CREATININE 0.5* 0.56 0.62  CALCIUM 10.0 8.9 8.8   Liver Function Tests:  Recent Labs Lab 04/11/14 1347 04/12/14 0650 04/13/14 0545  AST 41* 56* 27  ALT 71* 89* 53  ALKPHOS 140* 149* 137*  BILITOT 1.20 1.2 1.9*  PROT 7.7 6.4 5.6*  ALBUMIN  --  3.5 3.0*   No results for input(s): LIPASE, AMYLASE in the last 168 hours. No results for input(s): AMMONIA in the last 168 hours. CBC:  Recent Labs Lab 04/11/14 1312 04/12/14 0650 04/13/14 0545  WBC 2.3* 26.0* 28.7*  NEUTROABS 1.0* 23.2*  --   HGB 14.0 13.1 10.4*  HCT 44.3 41.7 32.5*  MCV 94 95.2 93.9  PLT 146 Platelet count confirmed by slide estimate 115* 93*   Cardiac Enzymes: No results for input(s): CKTOTAL, CKMB, CKMBINDEX, TROPONINI in the last 168 hours. BNP (last 3 results) No results for input(s): PROBNP in the last 8760 hours. CBG:  Recent Labs Lab 04/12/14 2029 04/13/14 0013 04/13/14 0454 04/13/14 0800 04/13/14 1146  GLUCAP 175* 89 100* 95 83    Recent Results (from the past 240 hour(s))  Blood Culture (routine x 2)     Status: None (Preliminary result)   Collection Time: 04/12/14  6:50 AM  Result Value Ref Range Status   Specimen Description BLOOD RIGHT CHEST  Final    Special Requests BOTTLES DRAWN AEROBIC AND ANAEROBIC 5CC CHEST PORT  Final   Culture   Final           BLOOD CULTURE RECEIVED NO GROWTH TO DATE CULTURE WILL BE HELD FOR 5 DAYS BEFORE ISSUING A FINAL NEGATIVE REPORT Performed at [facility]    Report Status PENDING  Incomplete  Blood Culture (routine x 2)     Status: None (Preliminary result)   Collection Time: 04/12/14  7:20 AM  Result Value Ref Range Status   Specimen Description BLOOD HAND LEFT  Final   Special Requests BOTTLES DRAWN AEROBIC ONLY 5CC  Final   Culture   Final           BLOOD CULTURE RECEIVED NO GROWTH TO DATE CULTURE WILL BE HELD FOR 5 DAYS BEFORE ISSUING A FINAL NEGATIVE REPORT Performed at [facility]    Report Status PENDING  Incomplete           Studies: Dg Chest Port 1 View  04/12/2014   CLINICAL DATA:  Fever and shortness of breath.  EXAM: PORTABLE CHEST - 1 VIEW  COMPARISON:  03/28/2014  FINDINGS: Tip of the right chest port  remains in the SVC. Lung volumes are low. There is unchanged linear atelectasis/scar at the left lung base. Right lung is grossly clear. Heart size is normal. No pleural effusion or pneumothorax. Extensive spinal fusion hardware in place.  IMPRESSION: Unchanged atelectasis or scarring at the left lung base. No acute process.   Electronically Signed   By: Jeb Levering M.D.   On: 04/12/2014 06:51        Scheduled Meds: . ascorbic acid  300 mg Per Tube q morning - 10a  . calcium carbonate (dosed in mg elemental calcium)  1,250 mg of elemental calcium Per Tube TID  . cloBAZam  5 mg Per Tube BID  . enoxaparin (LOVENOX) injection  30 mg Subcutaneous Q24H  . feeding supplement (PRO-STAT SUGAR FREE 64)  30 mL Per Tube BID  . folic acid  1 mg Per Tube Daily  . free water  172 mL Per Tube QID  . gabapentin  825 mg Per Tube TID  . levalbuterol  0.63 mg Nebulization BID  . multivitamin  10 mL Per Tube Daily  . pantoprazole sodium  40 mg Per Tube BID  .  Pseudoephedrine HCl  90 mg Per Tube Q6H  . simethicone  120 mg Oral TID  . sodium chloride  3 mL Intravenous Q12H  . sucralfate  1 g Per Tube QID  . zinc oxide   Topical 5 X Daily   Continuous Infusions: . baclofen    . dextrose 150 mL/hr at 04/13/14 0959  . TwoCal HN      Principal Problem:   Sepsis Active Problems:   Congenital spastic quadriplegia   Dysphagia   Generalized convulsive epilepsy   OSA on CPAP   Portacath in place   History of pancytopenia   Leukocytosis-secondary to Neulasta.    Time spent: 30 minutes.    Vernell Leep, MD, FACP, FHM. [facility] Pager [phone]  If 7PM-7AM, please contact night-coverage [email or web address] Password [email or web address] 04/13/2014, 2:16 PM    LOS: 1 day

## 2014-04-13 NOTE — Progress Notes (Signed)
Placed pt. On non-invasive. Pt. Tolerating well at this time on auto mode. Pt. Has 1L of oxygen titrated into the unit.

## 2014-04-13 NOTE — Progress Notes (Signed)
INFECTIOUS DISEASE PROGRESS NOTE  ID: Jeffery [name] is a 24 y.o. male with  Principal Problem:   Sepsis Active Problems:   Congenital spastic quadriplegia   Dysphagia   Generalized convulsive epilepsy   OSA on CPAP   Portacath in place   History of pancytopenia   Leukocytosis-secondary to Neulasta.  Subjective: " he had a small seizure this Am which is normal for him" Resting quietly.   Abtx:  Anti-infectives    Start     Dose/Rate Route Frequency Ordered Stop   04/12/14 1600  piperacillin-tazobactam (ZOSYN) IVPB 3.375 g  Status:  Discontinued     3.375 g 12.5 mL/hr over 240 Minutes Intravenous Every 8 hours 04/12/14 0947 04/13/14 1035   04/12/14 1400  vancomycin (VANCOCIN) IVPB 750 mg/150 ml premix  Status:  Discontinued     750 mg 150 mL/hr over 60 Minutes Intravenous Every 8 hours 04/12/14 0947 04/13/14 1035   04/12/14 0930  piperacillin-tazobactam (ZOSYN) IVPB 3.375 g     3.375 g 100 mL/hr over 30 Minutes Intravenous  Once 04/12/14 0921 04/12/14 1101   04/12/14 0730  aztreonam (AZACTAM) 2 g in dextrose 5 % 50 mL IVPB  Status:  Discontinued     2 g 100 mL/hr over 30 Minutes Intravenous  Once 04/12/14 0644 04/12/14 0921   04/12/14 0645  levofloxacin (LEVAQUIN) IVPB 750 mg     750 mg 100 mL/hr over 90 Minutes Intravenous  Once 04/12/14 0644 04/12/14 0959   04/12/14 0645  vancomycin (VANCOCIN) IVPB 1000 mg/200 mL premix     1,000 mg 200 mL/hr over 60 Minutes Intravenous  Once 04/12/14 0644 04/12/14 0930      Medications:  Scheduled: . ascorbic acid  300 mg Per Tube q morning - 10a  . calcium carbonate (dosed in mg elemental calcium)  1,250 mg of elemental calcium Per Tube TID  . cloBAZam  5 mg Per Tube BID  . enoxaparin (LOVENOX) injection  30 mg Subcutaneous Q24H  . feeding supplement (PRO-STAT SUGAR FREE 64)  30 mL Per Tube BID  . folic acid  1 mg Per Tube Daily  . free water  172 mL Per Tube QID  . gabapentin  825 mg Per Tube TID  . levalbuterol  0.63 mg  Nebulization BID  . multivitamin  10 mL Per Tube Daily  . pantoprazole sodium  40 mg Per Tube BID  . Pseudoephedrine HCl  90 mg Per Tube Q6H  . simethicone  120 mg Oral TID  . sodium chloride  3 mL Intravenous Q12H  . sucralfate  1 g Per Tube QID  . zinc oxide   Topical 5 X Daily    Objective: Vital signs in last 24 hours: Temp:  [98 F (36.7 C)-99.9 F (37.7 C)] 98.1 F (36.7 C) (03/12 0835) Pulse Rate:  [82-129] 106 (03/12 0835) Resp:  [17-28] 17 (03/12 0400) BP: (95-137)/(54-76) 114/66 mmHg (03/12 0835) SpO2:  [95 %-100 %] 99 % (03/12 0902) Weight:  [57.607 kg (127 lb)] 57.607 kg (127 lb) (03/12 0400)   General appearance: no distress Resp: clear to auscultation bilaterally Cardio: regular rate and rhythm GI: normal findings: bowel sounds normal and soft, non-tender  Lab Results  Recent Labs  04/12/14 0650 04/13/14 0545  WBC 26.0* 28.7*  HGB 13.1 10.4*  HCT 41.7 32.5*  NA 145 138  K 3.7 3.5  CL 105 99  CO2 26 26  BUN 21 8  CREATININE 0.56 0.62   Liver Panel  Recent Labs  04/12/14 0650 04/13/14 0545  PROT 6.4 5.6*  ALBUMIN 3.5 3.0*  AST 56* 27  ALT 89* 53  ALKPHOS 149* 137*  BILITOT 1.2 1.9*   Sedimentation Rate No results for input(s): ESRSEDRATE in the last 72 hours. C-Reactive Protein No results for input(s): CRP in the last 72 hours.  Microbiology: Recent Results (from the past 240 hour(s))  Blood Culture (routine x 2)     Status: None (Preliminary result)   Collection Time: 04/12/14  6:50 AM  Result Value Ref Range Status   Specimen Description BLOOD RIGHT CHEST  Final   Special Requests BOTTLES DRAWN AEROBIC AND ANAEROBIC 5CC CHEST PORT  Final   Culture   Final           BLOOD CULTURE RECEIVED NO GROWTH TO DATE CULTURE WILL BE HELD FOR 5 DAYS BEFORE ISSUING A FINAL NEGATIVE REPORT Performed at [facility]    Report Status PENDING  Incomplete  Blood Culture (routine x 2)     Status: None (Preliminary result)   Collection Time:  04/12/14  7:20 AM  Result Value Ref Range Status   Specimen Description BLOOD HAND LEFT  Final   Special Requests BOTTLES DRAWN AEROBIC ONLY 5CC  Final   Culture   Final           BLOOD CULTURE RECEIVED NO GROWTH TO DATE CULTURE WILL BE HELD FOR 5 DAYS BEFORE ISSUING A FINAL NEGATIVE REPORT Performed at [facility]    Report Status PENDING  Incomplete    Studies/Results: Dg Chest Port 1 View  04/12/2014   CLINICAL DATA:  Fever and shortness of breath.  EXAM: PORTABLE CHEST - 1 VIEW  COMPARISON:  03/28/2014  FINDINGS: Tip of the right chest port remains in the SVC. Lung volumes are low. There is unchanged linear atelectasis/scar at the left lung base. Right lung is grossly clear. Heart size is normal. No pleural effusion or pneumothorax. Extensive spinal fusion hardware in place.  IMPRESSION: Unchanged atelectasis or scarring at the left lung base. No acute process.   Electronically Signed   By: Jeb Levering M.D.   On: 04/12/2014 06:51     Assessment/Plan: SIRS Recent neupogen Cerebral Palsy  Total days of antibiotics 2 (vanco/zosyn)  Agree with stopping his anbx and observing him This certainly could be a neupogen reaction Would await his Cx 24h more, then consider d/c home.           Jeffery [name] Infectious Diseases (pager) [phone] [email or web address] 04/13/2014, 12:05 PM  LOS: 1 day

## 2014-04-13 NOTE — Progress Notes (Signed)
INITIAL NUTRITION ASSESSMENT  DOCUMENTATION CODES Per approved criteria  -Not Applicable   INTERVENTION: Initiate Pts TF regimen Two Cal HN as at home per [facility] at 46 ml/hr for 12 hours w/ 220 ml flush x3  Provide Prostat 30 ml BID  Provides 1304 kcal, 76 grams of protein, and 1046 ml of H20  NUTRITION DIAGNOSIS: Increased Protein/energy needs related to stress under acute illness as evidenced by sepsis  Goal: Pt to meet >/= 90% of their estimated nutrition needs   Monitor:  TF to goal, TF tolerance, labs  Reason for Assessment: Consult for HTF regimen  24 y.o. male  Admitting Dx: Sepsis  ASSESSMENT: 24 y.o. male with history of cerebral palsy, spastic quadriplegia, wheelchair mobile, seizure disorder, has Port-A-Cath, PEG tube and baclofen pump, lives at home with foster parents, recent hospitalization 2/25/2/29 for neutropenic fever.Sepsis physiology present on admission  Met with father to obtain home TF regimen. Patient does 2 1/3 cans og 2 cal HN daily through [facility]. Pump runs @ 46 ml. Also receives Prostat BID. Pt receives medication to help with gas/bloating-father forgot name-semithicone?   Height: Ht Readings from Last 1 Encounters:  04/13/14 4' 2" (1.27 m)  Per parent and past measurements: Pt is 4' 11  Weight: Wt Readings from Last 1 Encounters:  04/13/14 127 lb (57.607 kg)    Ideal Body Weight: 95 lbs (adjusted for quadriplegia)  % Ideal Body Weight: 133%  Wt Readings from Last 10 Encounters:  04/13/14 127 lb (57.607 kg)  04/03/14 129 lb (58.514 kg)  03/02/14 134 lb 0.6 oz (60.8 kg)  12/12/13 128 lb (58.06 kg)  04/17/13 124 lb (56.246 kg)  12/25/12 116 lb (52.617 kg)  11/29/12 116 lb 2.9 oz (52.7 kg)  11/07/12 116 lb (52.617 kg)  10/30/12 116 lb (52.617 kg)  10/05/12 116 lb (52.617 kg)    Usual Body Weight: 124  % Usual Body Weight: 102%  BMI:  25.2 with 4' 11.5" ht  Estimated Nutritional Needs: Kcal: 1150-1350 (20-24 kcal/kg) Protein:  75-86 (1.3-1.5) Fluid: >1.2 liters  Skin: abrasion on head  Diet Order: Diet NPO time specified  EDUCATION NEEDS: -No education needs identified at this time   Intake/Output Summary (Last 24 hours) at 04/13/14 0848 Last data filed at 04/13/14 0648  Gross per 24 hour  Intake 2037.5 ml  Output      0 ml  Net 2037.5 ml    Last BM: 3/11  Labs:   Recent Labs Lab 04/11/14 1347 04/12/14 0650 04/13/14 0545  NA 146* 145 138  K 3.8 3.7 3.5  CL 101 105 99  CO2 32 26 26  BUN _0 CREATININE 0.5* 0.56 0.62  CALCIUM 10.0 8.9 8.8  GLUCOSE 101 78 88    CBG (last 3)   Recent Labs  04/13/14 0013 04/13/14 0454 04/13/14 0800  GLUCAP 89 100* 95    Scheduled Meds: . ascorbic acid  300 mg Per Tube q morning - 10a  . calcium carbonate (dosed in mg elemental calcium)  1,250 mg of elemental calcium Per Tube TID  . cloBAZam  5 mg Per Tube BID  . enoxaparin (LOVENOX) injection  30 mg Subcutaneous Q24H  . feeding supplement (PRO-STAT SUGAR FREE 64)  30 mL Oral BID  . folic acid  1 mg Per Tube Daily  . free water  172 mL Per Tube QID  . gabapentin  825 mg Per Tube TID  . levalbuterol  0.63 mg Nebulization BID  . multivitamin  10 mL Per Tube Daily  . pantoprazole sodium  40 mg Per Tube BID  . piperacillin-tazobactam (ZOSYN)  IV  3.375 g Intravenous Q8H  . Pseudoephedrine HCl  90 mg Per Tube Q6H  . simethicone  120 mg Oral TID  . sodium chloride  3 mL Intravenous Q12H  . sucralfate  1 g Per Tube QID  . vancomycin  750 mg Intravenous Q8H  . zinc oxide   Topical 5 X Daily    Continuous Infusions: . baclofen    . dextrose 150 mL/hr at 04/13/14 0248    Past Medical History  Diagnosis Date  . CP (cerebral palsy), spastic, quadriplegic   . Osteoporosis   . Undescended testes   . Seasonal allergies   . IVH (intraventricular hemorrhage) 10/04/90    Grade IV  . Hip dislocation, bilateral   . Dysphagia   . Retinopathy of prematurity   . 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 Neuromuscular scoliosis   . Osteoporosis   . Complex partial seizures   . Generalized convulsive epilepsy without mention of intractable epilepsy   . Sinus bradycardia     HR drops to 38-40 while sleeping  . Blister of right heel     fluid filled; origin unknown  . Kidney stones     ?  Marland Kitchen Pneumonia      chronic pneumonia ,respitory failure dx Augest 2014  . Aspiration pneumonia     "chronic" (04/12/2014)  . OSA treated with BiPAP     "since age 41"   . Anemia   . 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 GERD (gastroesophageal reflux disease)   . Epilepsy   . Spastic quadriplegia     Past Surgical History  Procedure Laterality Date  . Mole removal  "several B/T 2008-2010"    "from all over"  . Jejunostomy feeding tube  03/08/2013; ~ 09/2013; ~ 01/2014    "transgastric-jujunal feeding tube"  . Gastrostomy tube placement  11./02/1998       . Button change  12/15/2010    Procedure: BUTTON CHANGE;  Surgeon: Gatha Mayer, MD;  Location: [facility];  Service: Endoscopy;  Laterality: N/A;  . Peg placement  10/07/2011    Procedure: PERCUTANEOUS ENDOSCOPIC GASTROSTOMY (PEG) REPLACEMENT;  Surgeon: Lafayette Dragon, MD;  Location: [facility];  Service: Endoscopy;  Laterality: N/A;  Needs 18 F 2.5 button ordered-dl  . Inguinal hernia repair Bilateral 1992  . Retinopathy of prematurity surgery  1992  . Achilles tendon lengthening Bilateral 12/1998  . Tendon release  12/1998    Soft tissue releases  wrists and fingers [Other]  . Infusion pump implantation  07/25/2000; 2013    Intrathecal baclofen pump  . Peg placement N/A 06/05/2012    Procedure: PERCUTANEOUS ENDOSCOPIC GASTROSTOMY (PEG) REPLACEMENT;  Surgeon: Lafayette Dragon, MD;  Location: [facility];  Service: Endoscopy;  Laterality: N/A;  button 75f.2.5cm  . Strabismus surgery Bilateral 1993    "3 on right; 2 on left"  . Back surgery  ~ 2008    Harrington Rods in  back needs to be log rolled  . Tendon repair Bilateral 09/05/2012    Procedure: LENGTHENING OF DIGITAL FLEXOR TENDONS BILTERAL HANDS;  Surgeon: DJolyn Nap MD;  [place] [facility]  Service: [place]ics;  Laterality: Bilateral;  . Peg placement N/A 09/13/2012    Procedure: PERCUTANEOUS ENDOSCOPIC GASTROSTOMY (PEG) REPLACEMENT;  Surgeon: DLafayette Dragon MD;  Location: [facility];  Service: Endoscopy;  Laterality: N/A;  . Flexible sigmoidoscopy N/A 10/30/2012    Procedure: FLEXIBLE SIGMOIDOSCOPY;  Surgeon: Cleotis Nipper, MD;  Location: [facility];  Service: Endoscopy;  Laterality: N/A;  . Peg placement N/A 11/15/2012    Procedure: PERCUTANEOUS ENDOSCOPIC GASTROSTOMY (PEG) REPLACEMENT;  Surgeon: Jeryl Columbia, MD;  Location: [facility];  Service: Endoscopy;  Laterality: N/A;  . Gastrostomy tube placement  11./02/1998  . Rectal biopsy  10/29/2012    S/P diarrhea from Vancomycin  . Portacath placement Right 11/25/2012    chest  . Eye surgery      Burtis Junes RD, LDN Nutrition Pager: [phone] 04/13/2014 8:50 AM

## 2014-04-13 NOTE — Plan of Care (Signed)
Problem: Consults Goal: Nutrition Consult-if indicated Outcome: Progressing Nutrition consulted to restart tube feeds. Will continue to monitor.  Problem: Phase I Progression Outcomes Goal: OOB as tolerated unless otherwise ordered Outcome: Completed/Met Date Met:  04/13/14 Patient up in wheelchair at bedside.

## 2014-04-14 DIAGNOSIS — R509 Fever, unspecified: Secondary | ICD-10-CM | POA: Insufficient documentation

## 2014-04-14 DIAGNOSIS — R502 Drug induced fever: Secondary | ICD-10-CM

## 2014-04-14 DIAGNOSIS — T458X5A Adverse effect of other primarily systemic and hematological agents, initial encounter: Principal | ICD-10-CM

## 2014-04-14 LAB — COMPREHENSIVE METABOLIC PANEL
ALT: 46 U/L (ref 0–53)
ANION GAP: 14 (ref 5–15)
AST: 29 U/L (ref 0–37)
Albumin: 3.2 g/dL — ABNORMAL LOW (ref 3.5–5.2)
Alkaline Phosphatase: 281 U/L — ABNORMAL HIGH (ref 39–117)
BUN: 6 mg/dL (ref 6–23)
CO2: 29 mmol/L (ref 19–32)
Calcium: 9.2 mg/dL (ref 8.4–10.5)
Chloride: 99 mmol/L (ref 96–112)
Creatinine, Ser: 0.53 mg/dL (ref 0.50–1.35)
GFR calc Af Amer: >90 mL/min (ref >90)
Glucose, Bld: 72 mg/dL (ref 70–99)
Potassium: 2.9 mmol/L — ABNORMAL LOW (ref 3.5–5.1)
Sodium: 142 mmol/L (ref 135–145)
TOTAL PROTEIN: 5.9 g/dL — AB (ref 6.0–8.3)
Total Bilirubin: 1 mg/dL (ref 0.3–1.2)

## 2014-04-14 LAB — CBC
HEMATOCRIT: 31.5 % — AB (ref 39.0–52.0)
Hemoglobin: 10.1 g/dL — ABNORMAL LOW (ref 13.0–17.0)
MCH: 30.1 pg (ref 26.0–34.0)
MCHC: 32.1 g/dL (ref 30.0–36.0)
MCV: 94 fL (ref 78.0–100.0)
Platelets: 87 10*3/uL — ABNORMAL LOW (ref 150–400)
RBC: 3.35 MIL/uL — ABNORMAL LOW (ref 4.22–5.81)
RDW: 17.1 % — ABNORMAL HIGH (ref 11.5–15.5)
WBC: 18.8 10*3/uL — ABNORMAL HIGH (ref 4.0–10.5)

## 2014-04-14 LAB — GLUCOSE, CAPILLARY
GLUCOSE-CAPILLARY: 79 mg/dL (ref 70–99)
GLUCOSE-CAPILLARY: 87 mg/dL (ref 70–99)
Glucose-Capillary: 84 mg/dL (ref 70–99)
Glucose-Capillary: 86 mg/dL (ref 70–99)

## 2014-04-14 LAB — MAGNESIUM: Magnesium: 1.7 mg/dL (ref 1.5–2.5)

## 2014-04-14 LAB — CLOSTRIDIUM DIFFICILE BY PCR: Toxigenic C. Difficile by PCR: NEGATIVE

## 2014-04-14 MED ORDER — POTASSIUM CHLORIDE 20 MEQ/15ML (10%) PO SOLN
40.0000 meq | ORAL | Status: AC
  Administered 2014-04-14 (×2): 40 meq
  Filled 2014-04-14 (×2): qty 30

## 2014-04-14 MED ORDER — DIPHENOXYLATE-ATROPINE 2.5-0.025 MG/5ML PO LIQD
5.0000 mL | Freq: Once | ORAL | Status: AC
  Administered 2014-04-14: 5 mL
  Filled 2014-04-14: qty 5

## 2014-04-14 MED ORDER — NONFORMULARY OR COMPOUNDED ITEM: Status: DC

## 2014-04-14 NOTE — Discharge Summary (Signed)
Physician Discharge Summary  Milt Coye HQI:[id number] DOB: [birth year] DOA: 04/12/2014  PCP: Marijean Bravo, MD  Outpatient Specialists:   Neurology: Dr. Wyline Copas.  Hematology: Dr. Burney Gauze.  Admit date: 04/12/2014 Discharge date: 04/14/2014  Time spent: Greater than 30 minutes  Recommendations for Outpatient Follow-up:  1. Dr. Burney Gauze, Hematology/Oncology in 2 days with repeat labs (CBC & BMP). Please follow final blood culture results that were sent from the hospital. 2. Dr. Lysle Pearl, PCP in 1 week. 3. Dr. Wyline Copas, Neurology   Discharge Diagnoses:  Principal Problem:   Sepsis Active Problems:   Congenital spastic quadriplegia   Dysphagia   Generalized convulsive epilepsy   OSA on CPAP   Portacath in place   History of pancytopenia   Leukocytosis-secondary to Neulasta.   Other pancytopenia   Discharge Condition: Improved & Stable  Diet recommendation: NPO. All meds and feeds via PEG tube.  Filed Weights   04/12/14 0815 04/13/14 0400  Weight: 58.06 kg (128 lb) 57.607 kg (127 lb)    History of present illness:  24 y.o. male with history of cerebral palsy, spastic quadriplegia, wheelchair mobile, seizure disorder, has Port-A-Cath, PEG tube and baclofen pump, lives at home with foster parents, recent hospitalization 2/25/2/29 for neutropenic fever-workup including blood culture, urine culture, flu panel PCR and chest x-ray negative, pancytopenia attributed to Keppra which was discontinued, transfused 4 units PRBC and received Neupogen and had an episode of seizure the day prior to discharge, presented to the [facility] ED with low-grade fever and altered mental status. He was in his usual state of health on 04/11/14. Received Neulasta and couple of hours later developed low-grade fever, sweating and altered mental status. Admitted with SIRS/suspected sepsis picture and started on broad spectrum antibiotics.  Hospital Course:     SIRS:  Initially treated for possible sepsis of an determined origin with broad-spectrum antibiotics including IV vancomycin and Zosyn. Chest x-ray negative. Urine microscopy negative. As discussed with foster dad, patient was well until he returned after Neulasta shot. Oncology input reviewed. It's possible that his presentation was secondary to Neulasta reaction rather than infectious etiology. As discussed with ID, DC'ed all antibiotics and monitored overnight - no fevers. Follow out standing cultures.  Pancytopenia: Noticed during 2 prior hospitalizations in early February in late March. Hematology felt that this was related to [place] which was discontinued. Peripheral smear unremarkable. Leukocytosis secondary to Neulasta & decreasing. Hemoglobin and platelet count stable over last 24 hours. Close outpatient follow-up with hematology.   Acute encephalopathy: Secondary to problem #1. As per family, mental status changes have almost resolved.  Seizure disorder: Continue clobazam and when necessary rectal Valium. As per dad, patient has infrequent brief seizure episodes even at home and had one 3/12 morning. Monitor closely. No further seizures reported.  OSA: Continue nightly C Pap.  History of cerebral palsy/congenital spastic quadriplegia/baclofen pump: Baclofen pump refilled 03/28/14  Dysphagia, s/p PEG tube: Continue tube feeds.  Diarrhea: As per family, patient usually has 2-3 BMs related to tube feeds and also more when he gets antibiotics/vancomycin. C. difficile PCR checked and negative.  Hypokalemia: Replaced prior to discharge.  Leukocytosis: Secondary to Neulasta. WBC decreasing. Close outpatient follow-up with hematology.  Consultants:  ID  Procedures:  Nightly CPAP   Discharge Exam:  Complaints: Patient nonverbal. As per mother at bedside, mental status almost 75% back to baseline and feels that he will be himself when he returns to his usual home environments. No further  seizures or  complaints reported.  Filed Vitals:   04/14/14 0517 04/14/14 0809 04/14/14 0922 04/14/14 1141  BP:   98/61   Pulse: 106  108   Temp:   97.4 F (36.3 C)   TempSrc:   Axillary   Resp:   20   Height:      Weight:      SpO2:  96% 93% 96%    General exam: Pleasant young male sitting up comfortably on chair. Nonverbal. Respiratory system: Clear. No increased work of breathing. Cardiovascular system: S1 & S2 heard, RRR. No JVD, murmurs, gallops, clicks or pedal edema. telemetry: ST 100's Gastrointestinal system: Abdomen is nondistended, soft and nontender. Normal bowel sounds heard. PEG tube intact. Right mid quadrant baclofen pump intact  Central nervous system: Alert and smiling. Interacts with Mom. No focal neurological deficits. Extremities: Decreased bulk and 0 x 5 power in all limbs.  Discharge Instructions      Discharge Instructions    Call MD for:  difficulty breathing, headache or visual disturbances    Complete by:  As directed      Call MD for:  extreme fatigue    Complete by:  As directed      Call MD for:  persistant dizziness or light-headedness    Complete by:  As directed      Call MD for:  persistant nausea and vomiting    Complete by:  As directed      Call MD for:  redness, tenderness, or signs of infection (pain, swelling, redness, odor or green/yellow discharge around incision site)    Complete by:  As directed      Call MD for:  severe uncontrolled pain    Complete by:  As directed      Call MD for:  temperature >100.4    Complete by:  As directed      Call MD for:    Complete by:  As directed   Worsening mental status changes or recurrent seizures.     Discharge instructions    Complete by:  As directed   Continue tube feeds and all medications via tube as prior to admission. Continue nightly CPAP.     Increase activity slowly    Complete by:  As directed             Medication List    TAKE these medications        albuterol (2.5  MG/3ML) 0.083% nebulizer solution  Commonly known as:  PROVENTIL  Take 2.5 mg by nebulization. 1 ampule twice daily and as needed (making 4 doses daily)     albuterol 108 (90 BASE) MCG/ACT inhaler  Commonly known as:  PROVENTIL HFA;VENTOLIN HFA  Inhale 2 puffs into the lungs every 4 (four) hours as needed for shortness of breath.     baclofen 32202 MCG/20ML Soln  Commonly known as:  GABLOFEN  385.2 mcg by Intrathecal route continuous.     BALMEX 11.3 % Crea cream  Generic drug:  zinc oxide  Apply 1 application topically 5 (five) times daily. With each diaper change.     calcium carbonate (dosed in mg elemental calcium) 1250 (500 CA) MG/5ML  1,250 mg of elemental calcium by PEG Tube route 3 (three) times daily. 5 cc via peg tube     cloBAZam 2.5 MG/ML solution  Commonly known as:  ONFI  Place 2 mLs (5 mg total) into feeding tube 2 (two) times daily.     D-VITA 400 UNIT/ML Liqd  Generic  drug:  cholecalciferol  2.5 mLs by Gastric Tube route daily. Via G-Tube Port     DIASTAT ACUDIAL 20 MG Gel  Generic drug:  diazepam  Place 12.5 mg rectally as needed. For seizure lasting 2 minutes or longer or repetitive seizures.  No more than 1 dose every 12 hours or nasal versed (not both).     dicyclomine 10 MG/5ML syrup  Commonly known as:  BENTYL  Place into feeding tube every 8 (eight) hours as needed. Not to exceed 5 doses in 1 wk     diphenoxylate-atropine 2.5-0.025 MG/5ML liquid  Commonly known as:  LOMOTIL  Place 10 mLs into feeding tube 4 (four) times daily as needed for diarrhea or loose stools. 2 tsp via feeding tube q6h prn loose stools     folic acid 1 MG tablet  Commonly known as:  FOLVITE  Take 1 mg by mouth daily.     free water Soln  Place 172 mLs into feeding tube 4 (four) times daily.     furosemide 10 MG/ML solution  Commonly known as:  LASIX  Place 20 mg into feeding tube daily as needed for fluid.     gabapentin 250 MG/5ML solution  Commonly known as:   NEURONTIN  Take 18 mL 3 times daily     ibuprofen 100 MG/5ML suspension  Commonly known as:  ADVIL,MOTRIN  Place 400 mg into feeding tube every 6 (six) hours as needed for fever (pain).     ketoconazole 2 % cream  Commonly known as:  NIZORAL  Apply 1 application topically 2 (two) times daily as needed for irritation (yeast rash from antibiotics).     lansoprazole 30 MG disintegrating tablet  Commonly known as:  PREVACID SOLUTAB  Take 30 mg by mouth 2 (two) times daily. 730am     midazolam 5 MG/ML injection  Commonly known as:  VERSED  Place 2 mLs (10 mg total) into the nose once. Draw up 28ml in 2 syringes. Remove blue vial access device. Attach syringe to nasal atomizer for intranasal administration. Give 26ml in right nostril x 2 for seizures lasting 2 minutes or longer or for repetitive seizures in a short period of time.     MULTIVITAMIN Liqd  Take 10 mLs by mouth daily.     mupirocin ointment 2 %  Commonly known as:  BACTROBAN  Apply 1 application topically as needed (to G-T site).     OVER THE COUNTER MEDICATION  Apply 1 application topically as needed (after diaper changes). Balmex and Bag Balm paste made and applied after diaper changes     OXYGEN  Inhale into the lungs. Oxygen PRN to keep O2 Sat at 90%     potassium chloride 20 MEQ packet  Commonly known as:  KLOR-CON  Take 20 mEq by mouth daily.     simethicone 125 MG chewable tablet  Commonly known as:  MYLICON  Chew 951 mg by mouth every 6 (six) hours as needed for flatulence.     sodium phosphate enema  Commonly known as:  FLEET  Place 1 enema rectally. follow package directions PRN     sucralfate 1 GM/10ML suspension  Commonly known as:  CARAFATE  Place 1 g into feeding tube 4 (four) times daily. 7am, 130pm, 5pm, 8pm     SUDAFED CHILDRENS 15 MG/5ML Liqd  Generic drug:  Pseudoephedrine HCl  Give 90 mg by tube 3 (three) times daily.     TWOCAL HN Liqd  Take 237 mLs by  mouth. T.3 can at 46 cc hour x 12  hours - water 172 cc pre and post each and extra 172 bid.     PROMOD PO  Take 30 mLs by mouth 2 (two) times daily. Noon and 10pm     TYLENOL PO  Place 650 mg into feeding tube every 6 (six) hours as needed (fever).     Vitamin C 500 MG/5ML Liqd  300 mg by PEG Tube route every morning.     ZINC PO  5 mLs by PEG Tube route daily. 244 mg / 5 mL zinc solution       Follow-up Information    Follow up with [name], DAVID C, MD. Schedule an appointment as soon as possible for a visit in 1 week.   Specialty:  Internal Medicine   Why:  Hospital follow up.   Contact information:   [place] [address] [phone]       Follow up with Volanda Napoleon, MD. Schedule an appointment as soon as possible for a visit in 2 days.   Specialty:  Oncology   Why:  To be seen with repeat labs (CBC & BMP)   Contact information:   [address], [address] [place] [address] [phone]       Follow up with Jodi Geralds, MD.   Specialty:  Pediatrics   Contact information:   [address] [place] [address] [phone]        The results of significant diagnostics from this hospitalization (including imaging, microbiology, ancillary and laboratory) are listed below for reference.    Significant Diagnostic Studies: Dg Chest Port 1 View  04/12/2014   CLINICAL DATA:  Fever and shortness of breath.  EXAM: PORTABLE CHEST - 1 VIEW  COMPARISON:  03/28/2014  FINDINGS: Tip of the right chest port remains in the SVC. Lung volumes are low. There is unchanged linear atelectasis/scar at the left lung base. Right lung is grossly clear. Heart size is normal. No pleural effusion or pneumothorax. Extensive spinal fusion hardware in place.  IMPRESSION: Unchanged atelectasis or scarring at the left lung base. No acute process.   Electronically Signed   By: Jeb Levering M.D.   On: 04/12/2014 06:51   Dg Chest Port 1 View  (if Code Sepsis  Called)  03/28/2014   CLINICAL DATA:  Fever.  Code sepsis.  EXAM: PORTABLE CHEST - 1 VIEW  COMPARISON:  03/01/2014  FINDINGS: Shallow inspiration. Normal heart size and pulmonary vascularity. Power port type central venous catheter with tip over the cavoatrial junction. Linear atelectasis in the left lung base. No focal consolidation. No pneumothorax. No blunting of costophrenic angles. Postoperative changes with posterior rod fixation of the thoracic and visualized lumbar spine.  IMPRESSION: Shallow inspiration with linear atelectasis in the left lung base.   Electronically Signed   By: Lucienne Capers M.D.   On: 03/28/2014 23:07    Microbiology: Recent Results (from the past 240 hour(s))  Blood Culture (routine x 2)     Status: None (Preliminary result)   Collection Time: 04/12/14  6:50 AM  Result Value Ref Range Status   Specimen Description BLOOD RIGHT CHEST  Final   Special Requests BOTTLES DRAWN AEROBIC AND ANAEROBIC 5CC CHEST PORT  Final   Culture   Final           BLOOD CULTURE RECEIVED NO GROWTH TO DATE CULTURE WILL BE HELD FOR 5 DAYS BEFORE ISSUING A FINAL  NEGATIVE REPORT Performed at [facility]    Report Status PENDING  Incomplete  Urine culture     Status: None   Collection Time: 04/12/14  7:18 AM  Result Value Ref Range Status   Specimen Description URINE, CLEAN CATCH  Final   Special Requests NONE  Final   Colony Count NO GROWTH Performed at [facility]   Final   Culture NO GROWTH Performed at [facility]   Final   Report Status 04/13/2014 FINAL  Final  Blood Culture (routine x 2)     Status: None (Preliminary result)   Collection Time: 04/12/14  7:20 AM  Result Value Ref Range Status   Specimen Description BLOOD HAND LEFT  Final   Special Requests BOTTLES DRAWN AEROBIC ONLY 5CC  Final   Culture   Final           BLOOD CULTURE RECEIVED NO GROWTH TO DATE CULTURE WILL BE HELD FOR 5 DAYS BEFORE ISSUING A FINAL NEGATIVE REPORT Performed at  [facility]    Report Status PENDING  Incomplete  Clostridium Difficile by PCR     Status: None   Collection Time: 04/14/14  1:42 AM  Result Value Ref Range Status   C difficile by pcr NEGATIVE NEGATIVE Final     Labs: Basic Metabolic Panel:  Recent Labs Lab 04/11/14 1347 04/12/14 0650 04/13/14 0545 04/14/14 0500  NA 146* 145 138 142  K 3.8 3.7 3.5 2.9*  CL 101 105 99 99  CO2 32 26 26 29   GLUCOSE 101 78 88 72  BUN 19 21 8 6   CREATININE 0.5* 0.56 0.62 0.53  CALCIUM 10.0 8.9 8.8 9.2  MG  --   --   --  1.7   Liver Function Tests:  Recent Labs Lab 04/11/14 1347 04/12/14 0650 04/13/14 0545 04/14/14 0500  AST 41* 56* 27 29  ALT 71* 89* 53 46  ALKPHOS 140* 149* 137* 281*  BILITOT 1.20 1.2 1.9* 1.0  PROT 7.7 6.4 5.6* 5.9*  ALBUMIN  --  3.5 3.0* 3.2*   No results for input(s): LIPASE, AMYLASE in the last 168 hours. No results for input(s): AMMONIA in the last 168 hours. CBC:  Recent Labs Lab 04/11/14 1312 04/12/14 0650 04/13/14 0545 04/14/14 0500  WBC 2.3* 26.0* 28.7* 18.8*  NEUTROABS 1.0* 23.2*  --   --   HGB 14.0 13.1 10.4* 10.1*  HCT 44.3 41.7 32.5* 31.5*  MCV 94 95.2 93.9 94.0  PLT 146 Platelet count confirmed by slide estimate 115* 93* 87*   Cardiac Enzymes: No results for input(s): CKTOTAL, CKMB, CKMBINDEX, TROPONINI in the last 168 hours. BNP: BNP (last 3 results) No results for input(s): BNP in the last 8760 hours.  ProBNP (last 3 results) No results for input(s): PROBNP in the last 8760 hours.  CBG:  Recent Labs Lab 04/13/14 2002 04/13/14 2352 04/14/14 0342 04/14/14 0748 04/14/14 1206  GLUCAP 89 86 84 79 87    Discussed in detail with patient's mother Ms. [name] at bedside.   Signed:  Vernell Leep, MD, FACP, FHM. [facility] Pager [phone]  If 7PM-7AM, please contact night-coverage [email or web address] Password [email or web address] 04/14/2014, 1:51 PM

## 2014-04-14 NOTE — Discharge Instructions (Signed)

## 2014-04-14 NOTE — Progress Notes (Signed)
On the am of 04/12/14 a home medication was brought in( clobazam liquid) The medication was brought in for pharmacy to dispense but the pharmacy carries this medication so the bottle was returned to the RN.  The bottle was lost when the patient transferred up to the floor but was found in another room on the unit the patient was transferred to.  The family has concerns that the medication, a controlled substance, was unaccounted for >24 hours and may potentially be tampered with.  The pharmacy will dispense an amount of Clobazam liquid equal to the amount that the patient had remaining which is ~171ml.  A prescription was approved by Dr Algis Liming and the medication will be dispensed by the [facility].  Thank you, Excell Seltzer

## 2014-04-14 NOTE — Plan of Care (Signed)
Problem: Phase III Progression Outcomes Goal: Pain controlled on oral analgesia Outcome: Completed/Met Date Met:  04/14/14 Controlled per peg, pt NPO

## 2014-04-14 NOTE — Progress Notes (Signed)
INFECTIOUS DISEASE PROGRESS NOTE  ID: Jeffery [name] is a 24 y.o. male with  Principal Problem:   Sepsis Active Problems:   Congenital spastic quadriplegia   Dysphagia   Generalized convulsive epilepsy   OSA on CPAP   Portacath in place   History of pancytopenia   Leukocytosis-secondary to Neulasta.   Other pancytopenia   Fever  Subjective: Per caretaker, back to baseline.   Abtx:  Anti-infectives    Start     Dose/Rate Route Frequency Ordered Stop   04/13/14 2230  fluconazole (DIFLUCAN) 40 MG/ML suspension 100 mg     100 mg Oral Daily at bedtime 04/13/14 2141     04/12/14 1600  piperacillin-tazobactam (ZOSYN) IVPB 3.375 g  Status:  Discontinued     3.375 g 12.5 mL/hr over 240 Minutes Intravenous Every 8 hours 04/12/14 0947 04/13/14 1035   04/12/14 1400  vancomycin (VANCOCIN) IVPB 750 mg/150 ml premix  Status:  Discontinued     750 mg 150 mL/hr over 60 Minutes Intravenous Every 8 hours 04/12/14 0947 04/13/14 1035   04/12/14 0930  piperacillin-tazobactam (ZOSYN) IVPB 3.375 g     3.375 g 100 mL/hr over 30 Minutes Intravenous  Once 04/12/14 0921 04/12/14 1101   04/12/14 0730  aztreonam (AZACTAM) 2 g in dextrose 5 % 50 mL IVPB  Status:  Discontinued     2 g 100 mL/hr over 30 Minutes Intravenous  Once 04/12/14 0644 04/12/14 0921   04/12/14 0645  levofloxacin (LEVAQUIN) IVPB 750 mg     750 mg 100 mL/hr over 90 Minutes Intravenous  Once 04/12/14 0644 04/12/14 0959   04/12/14 0645  vancomycin (VANCOCIN) IVPB 1000 mg/200 mL premix     1,000 mg 200 mL/hr over 60 Minutes Intravenous  Once 04/12/14 0644 04/12/14 0930      Medications:  Scheduled: . ascorbic acid  300 mg Per Tube q morning - 10a  . calcium carbonate (dosed in mg elemental calcium)  1,250 mg of elemental calcium Per Tube TID  . cloBAZam  5 mg Per Tube BID  . enoxaparin (LOVENOX) injection  30 mg Subcutaneous Q24H  . feeding supplement (PRO-STAT SUGAR FREE 64)  30 mL Per Tube BID  . fluconazole  100 mg Oral  QHS  . folic acid  1 mg Per Tube Daily  . free water  220 mL Per Tube QID  . gabapentin  825 mg Per Tube TID  . levalbuterol  0.63 mg Nebulization BID  . multivitamin  10 mL Per Tube Daily  . pantoprazole sodium  40 mg Per Tube BID  . Pseudoephedrine HCl  90 mg Per Tube Q6H  . simethicone  120 mg Oral TID  . sodium chloride  3 mL Intravenous Q12H  . sucralfate  1 g Per Tube QID  . zinc oxide   Topical 5 X Daily    Objective: Vital signs in last 24 hours: Temp:  [97.4 F (36.3 C)-98.5 F (36.9 C)] 97.4 F (36.3 C) (03/13 0922) Pulse Rate:  [101-118] 108 (03/13 0922) Resp:  [20-28] 20 (03/13 0922) BP: (98-113)/(50-75) 98/61 mmHg (03/13 0922) SpO2:  [93 %-98 %] 96 % (03/13 1141)   General appearance: alert and no distress  Lab Results  Recent Labs  04/13/14 0545 04/14/14 0500  WBC 28.7* 18.8*  HGB 10.4* 10.1*  HCT 32.5* 31.5*  NA 138 142  K 3.5 2.9*  CL 99 99  CO2 26 29  BUN 8 6  CREATININE 0.62 0.53   Liver  Panel  Recent Labs  04/13/14 0545 04/14/14 0500  PROT 5.6* 5.9*  ALBUMIN 3.0* 3.2*  AST 27 29  ALT 53 46  ALKPHOS 137* 281*  BILITOT 1.9* 1.0   Sedimentation Rate No results for input(s): ESRSEDRATE in the last 72 hours. C-Reactive Protein No results for input(s): CRP in the last 72 hours.  Microbiology: Recent Results (from the past 240 hour(s))  Blood Culture (routine x 2)     Status: None (Preliminary result)   Collection Time: 04/12/14  6:50 AM  Result Value Ref Range Status   Specimen Description BLOOD RIGHT CHEST  Final   Special Requests BOTTLES DRAWN AEROBIC AND ANAEROBIC 5CC CHEST PORT  Final   Culture   Final           BLOOD CULTURE RECEIVED NO GROWTH TO DATE CULTURE WILL BE HELD FOR 5 DAYS BEFORE ISSUING A FINAL NEGATIVE REPORT Performed at [facility]    Report Status PENDING  Incomplete  Urine culture     Status: None   Collection Time: 04/12/14  7:18 AM  Result Value Ref Range Status   Specimen Description URINE,  CLEAN CATCH  Final   Special Requests NONE  Final   Colony Count NO GROWTH Performed at [facility]   Final   Culture NO GROWTH Performed at [facility]   Final   Report Status 04/13/2014 FINAL  Final  Blood Culture (routine x 2)     Status: None (Preliminary result)   Collection Time: 04/12/14  7:20 AM  Result Value Ref Range Status   Specimen Description BLOOD HAND LEFT  Final   Special Requests BOTTLES DRAWN AEROBIC ONLY 5CC  Final   Culture   Final           BLOOD CULTURE RECEIVED NO GROWTH TO DATE CULTURE WILL BE HELD FOR 5 DAYS BEFORE ISSUING A FINAL NEGATIVE REPORT Performed at [facility]    Report Status PENDING  Incomplete  Clostridium Difficile by PCR     Status: None   Collection Time: 04/14/14  1:42 AM  Result Value Ref Range Status   C difficile by pcr NEGATIVE NEGATIVE Final    Studies/Results: No results found.   Assessment/Plan: SIRS Recent neupogen Cerebral Palsy  Total days of antibiotics 2 (vanco/zosyn)---> off 24h  Doing well off anbx Would consider home with f/u. His caretaker is Therapist, sports and is confident in his ability to care for him. Available if questions.          [name] Infectious Diseases (pager) [phone] [email or web address] 04/14/2014, 2:40 PM  LOS: 2 days

## 2014-04-15 ENCOUNTER — Telehealth: Payer: Self-pay | Admitting: Hematology & Oncology

## 2014-04-15 NOTE — Telephone Encounter (Signed)
Mother called very hard to understand slurs her words a lot. She demanded appointment with MD tomorrow, said pt got out of hospital. I put mother on hold and talked to MD who said to put pt on with Judson Roch and he would step in during visit. I told mother that and she was demanding that the instructions from hospital said to see Dr. Marin Olp, I said yes mamam I just spoke with Dr. Marin Olp and he said to put with Judson Roch and he would come in the room also. She was not happy about it and is insisting pt see MD I told her I had to do what Dr. Marin Olp tells me. She is aware of 3-15 appointment.

## 2014-04-16 ENCOUNTER — Other Ambulatory Visit (HOSPITAL_BASED_OUTPATIENT_CLINIC_OR_DEPARTMENT_OTHER): Payer: Medicaid Other | Admitting: Lab

## 2014-04-16 ENCOUNTER — Ambulatory Visit: Payer: Medicaid Other

## 2014-04-16 ENCOUNTER — Encounter: Payer: Self-pay | Admitting: *Deleted

## 2014-04-16 ENCOUNTER — Telehealth: Payer: Self-pay | Admitting: Internal Medicine

## 2014-04-16 ENCOUNTER — Ambulatory Visit (HOSPITAL_BASED_OUTPATIENT_CLINIC_OR_DEPARTMENT_OTHER): Payer: Medicaid Other | Admitting: Family

## 2014-04-16 VITALS — BP 112/63 | HR 112

## 2014-04-16 DIAGNOSIS — G809 Cerebral palsy, unspecified: Secondary | ICD-10-CM

## 2014-04-16 DIAGNOSIS — G808 Other cerebral palsy: Secondary | ICD-10-CM

## 2014-04-16 DIAGNOSIS — R509 Fever, unspecified: Secondary | ICD-10-CM

## 2014-04-16 DIAGNOSIS — R197 Diarrhea, unspecified: Secondary | ICD-10-CM

## 2014-04-16 DIAGNOSIS — D61818 Other pancytopenia: Secondary | ICD-10-CM

## 2014-04-16 LAB — CBC WITH DIFFERENTIAL (CANCER CENTER ONLY)
BASO#: 0 10*3/uL (ref 0.0–0.2)
BASO%: 0.4 % (ref 0.0–2.0)
EOS%: 0.8 % (ref 0.0–7.0)
Eosinophils Absolute: 0.1 10*3/uL (ref 0.0–0.5)
HCT: 34.1 % — ABNORMAL LOW (ref 38.7–49.9)
HEMOGLOBIN: 11 g/dL — AB (ref 13.0–17.1)
LYMPH#: 0.8 10*3/uL — ABNORMAL LOW (ref 0.9–3.3)
LYMPH%: 9.9 % — ABNORMAL LOW (ref 14.0–48.0)
MCH: 30.5 pg (ref 28.0–33.4)
MCHC: 32.3 g/dL (ref 32.0–35.9)
MCV: 95 fL (ref 82–98)
MONO#: 1 10*3/uL — ABNORMAL HIGH (ref 0.1–0.9)
MONO%: 12.8 % (ref 0.0–13.0)
NEUT%: 76.1 % (ref 40.0–80.0)
NEUTROS ABS: 6 10*3/uL (ref 1.5–6.5)
PLATELETS: 96 10*3/uL — AB (ref 145–400)
RBC: 3.61 10*6/uL — ABNORMAL LOW (ref 4.20–5.70)
RDW: 17 % — ABNORMAL HIGH (ref 11.1–15.7)
WBC: 7.9 10*3/uL (ref 4.0–10.0)

## 2014-04-16 LAB — GI PATHOGEN PANEL BY PCR, STOOL
C DIFFICILE TOXIN A/B: NOT DETECTED
Campylobacter by PCR: NOT DETECTED
Cryptosporidium by PCR: NOT DETECTED
E COLI 0157 BY PCR: NOT DETECTED
E coli (ETEC) LT/ST: NOT DETECTED
E coli (STEC): NOT DETECTED
G lamblia by PCR: NOT DETECTED
Norovirus GI/GII: NOT DETECTED
ROTAVIRUS A BY PCR: NOT DETECTED
Shigella by PCR: NOT DETECTED

## 2014-04-16 LAB — COMPREHENSIVE METABOLIC PANEL
ALK PHOS: 197 U/L — AB (ref 39–117)
ALT: 42 U/L (ref 0–53)
AST: 21 U/L (ref 0–37)
Albumin: 3.9 g/dL (ref 3.5–5.2)
BUN: 11 mg/dL (ref 6–23)
CO2: 29 meq/L (ref 19–32)
CREATININE: 0.43 mg/dL — AB (ref 0.50–1.35)
Calcium: 9.4 mg/dL (ref 8.4–10.5)
Chloride: 99 mEq/L (ref 96–112)
Glucose, Bld: 75 mg/dL (ref 70–99)
Potassium: 3.8 mEq/L (ref 3.5–5.3)
Sodium: 143 mEq/L (ref 135–145)
TOTAL PROTEIN: 6.3 g/dL (ref 6.0–8.3)
Total Bilirubin: 0.7 mg/dL (ref 0.2–1.2)

## 2014-04-16 LAB — TECHNOLOGIST REVIEW CHCC SATELLITE

## 2014-04-16 LAB — CHCC SATELLITE - SMEAR

## 2014-04-16 MED ORDER — HEPARIN SOD (PORK) LOCK FLUSH 100 UNIT/ML IV SOLN
500.0000 [IU] | Freq: Once | INTRAVENOUS | Status: AC
  Administered 2014-04-16: 500 [IU] via INTRAVENOUS
  Filled 2014-04-16: qty 5

## 2014-04-16 MED ORDER — SODIUM CHLORIDE 0.9 % IJ SOLN
10.0000 mL | INTRAMUSCULAR | Status: DC | PRN
  Administered 2014-04-16: 10 mL via INTRAVENOUS
  Filled 2014-04-16: qty 10

## 2014-04-16 NOTE — Patient Instructions (Signed)

## 2014-04-16 NOTE — Progress Notes (Signed)
Hematology and Oncology Follow Up Visit  Zyler Hyson [id number] 01-16-1991 23 y.o. 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 Fichera is here today with his parents after being hospitalized last week with a fever, tachycardia and altered mental status. Due to a WBC count of 2.3 he was given Neulasta. He had previously been getting Neupogen without any complications.  After receiving the Neulasta he developed a fever that night, was not as alert as usual and admitted at [facility].  He was released on Sunday and has not had a fever since he was admitted. He is more alert now.  His WBC today is 7.9. We will hold of on restarting the Neupogen for now.  He has an appointment with Dr. Marin Olp next week and we will keep that.  His mother asked if her could have a pneumonia vaccine and also if he could have his PEG tube replaced. I spoke with Dr. Marin Olp about these issues and he felt that both would be fine.  He has had no fever, chills, n/v, cough or rash. His mother has given him tylenol and Bentyl as needed for abdominal discomfort.  He has had no swelling in his extremities. No episodes of bleeding.   Medications:    Medication List       This list is accurate as of: 04/16/14 12:07 PM.  Always use your most recent med list.               albuterol (2.5 MG/3ML) 0.083% nebulizer solution  Commonly known as:  PROVENTIL  Take 2.5 mg by nebulization. 1 ampule twice daily and as needed (making 4 doses daily)     albuterol 108 (90 BASE) MCG/ACT inhaler  Commonly known as:  PROVENTIL HFA;VENTOLIN HFA  Inhale 2 puffs into the lungs every 4 (four) hours as needed for shortness of breath.     baclofen 16967 MCG/20ML Soln  Commonly known as:  GABLOFEN  385.2 mcg by Intrathecal route continuous.     BALMEX 11.3 % Crea cream  Generic drug:  zinc oxide  Apply 1 application  topically 5 (five) times daily. With each diaper change.     calcium carbonate (dosed in mg elemental calcium) 1250 (500 CA) MG/5ML  1,250 mg of elemental calcium by PEG Tube route 3 (three) times daily. 5 cc via peg tube     cloBAZam 2.5 MG/ML solution  Commonly known as:  ONFI  Place 2 mLs (5 mg total) into feeding tube 2 (two) times daily.     D-VITA 400 UNIT/ML Liqd  Generic drug:  cholecalciferol  2.5 mLs by Gastric Tube route daily. Via G-Tube Port     DIASTAT ACUDIAL 20 MG Gel  Generic drug:  diazepam  Place 12.5 mg rectally as needed. For seizure lasting 2 minutes or longer or repetitive seizures.  No more than 1 dose every 12 hours or nasal versed (not both).     dicyclomine 10 MG/5ML syrup  Commonly known as:  BENTYL  Place into feeding tube every 8 (eight) hours as needed. Not to exceed 5 doses in 1 wk     diphenoxylate-atropine 2.5-0.025 MG/5ML liquid  Commonly known as:  LOMOTIL  Place 10 mLs into feeding tube 4 (four) times daily as needed for diarrhea or loose stools. 2 tsp via feeding tube q6h prn loose stools     folic acid 1 MG tablet  Commonly known as:  FOLVITE  Take 1 mg by mouth daily.     free water Soln  Place 172 mLs into feeding tube 4 (four) times daily.     furosemide 10 MG/ML solution  Commonly known as:  LASIX  Place 20 mg into feeding tube daily as needed for fluid.     gabapentin 250 MG/5ML solution  Commonly known as:  NEURONTIN  Take 18 mL 3 times daily     ibuprofen 100 MG/5ML suspension  Commonly known as:  ADVIL,MOTRIN  Place 400 mg into feeding tube every 6 (six) hours as needed for fever (pain).     ketoconazole 2 % cream  Commonly known as:  NIZORAL  Apply 1 application topically 2 (two) times daily as needed for irritation (yeast rash from antibiotics).     lansoprazole 30 MG disintegrating tablet  Commonly known as:  PREVACID SOLUTAB  Take 30 mg by mouth 2 (two) times daily. 730am     midazolam 5 MG/ML injection  Commonly  known as:  VERSED  Place 2 mLs (10 mg total) into the nose once. Draw up 54ml in 2 syringes. Remove blue vial access device. Attach syringe to nasal atomizer for intranasal administration. Give 44ml in right nostril x 2 for seizures lasting 2 minutes or longer or for repetitive seizures in a short period of time.     MULTIVITAMIN Liqd  Take 10 mLs by mouth daily.     mupirocin ointment 2 %  Commonly known as:  BACTROBAN  Apply 1 application topically as needed (to G-T site).     OVER THE COUNTER MEDICATION  Apply 1 application topically as needed (after diaper changes). Balmex and Bag Balm paste made and applied after diaper changes     OXYGEN  Inhale into the lungs. Oxygen PRN to keep O2 Sat at 90%     potassium chloride 20 MEQ packet  Commonly known as:  KLOR-CON  Take 20 mEq by mouth daily.     simethicone 125 MG chewable tablet  Commonly known as:  MYLICON  Chew 188 mg by mouth every 6 (six) hours as needed for flatulence.     sodium phosphate enema  Commonly known as:  FLEET  Place 1 enema rectally. follow package directions PRN     sucralfate 1 GM/10ML suspension  Commonly known as:  CARAFATE  Place 1 g into feeding tube 4 (four) times daily. 7am, 130pm, 5pm, 8pm     SUDAFED CHILDRENS 15 MG/5ML Liqd  Generic drug:  Pseudoephedrine HCl  Give 90 mg by tube 3 (three) times daily.     TWOCAL HN Liqd  Take 237 mLs by mouth. T.3 can at 46 cc hour x 12 hours - water 172 cc pre and post each and extra 172 bid.     PROMOD PO  Take 30 mLs by mouth 2 (two) times daily. Noon and 10pm     TYLENOL PO  Place 650 mg into feeding tube every 6 (six) hours as needed (fever).     Vitamin C 500 MG/5ML Liqd  300 mg by PEG Tube route every morning.     ZINC PO  5 mLs by PEG Tube route daily. 244 mg / 5 mL zinc solution        Allergies:  Allergies  Allergen Reactions  . Adhesive [Tape]     Rips skin off  . Depakote [Divalproex Sodium]     Causes pancreatitis   . Keppra  [Levetiracetam] Other (See  Comments)    Bone marrow suppression  . Amoxicillin-Pot Clavulanate Rash    Give w fluconazole then no rash    Past Medical History, Surgical history, Social history, and Family History were reviewed and updated.  Review of Systems: All other 10 point review of systems is negative.   Physical Exam:  vitals were not taken for this visit.  Wt Readings from Last 3 Encounters:  04/13/14 127 lb (57.607 kg)  04/03/14 129 lb (58.514 kg)  03/02/14 134 lb 0.6 oz (60.8 kg)    Ocular: Sclerae unicteric, pupils equal, round and reactive to light Ear-nose-throat: Oropharynx clear, dentition fair Lymphatic: No cervical or supraclavicular adenopathy Lungs no rales or rhonchi, good excursion bilaterally Heart regular rate and rhythm, no murmur appreciated Abd soft, nontender, positive bowel sounds MSK has some atrophy with the cerebral palsy  Neuro he is alert but nonverbal  Lab Results  Component Value Date   WBC 18.8* 04/14/2014   HGB 10.1* 04/14/2014   HCT 31.5* 04/14/2014   MCV 94.0 04/14/2014   PLT 87* 04/14/2014   Lab Results  Component Value Date   FERRITIN 577* 03/29/2014   IRON <10* 03/29/2014   TIBC Not calculated due to Iron <10. 03/29/2014   UIBC 165 03/29/2014   IRONPCTSAT Not calculated due to Iron <10. 03/29/2014   Lab Results  Component Value Date   RETICCTPCT 1.2 03/29/2014   RBC 3.35* 04/14/2014   No results found for: KPAFRELGTCHN, LAMBDASER, KAPLAMBRATIO No results found for: IGGSERUM, IGA, IGMSERUM No results found for: Odetta Pink, SPEI   Chemistry      Component Value Date/Time   NA 142 04/14/2014 0500   NA 146* 04/11/2014 1347   K 2.9* 04/14/2014 0500   K 3.8 04/11/2014 1347   CL 99 04/14/2014 0500   CL 101 04/11/2014 1347   CO2 29 04/14/2014 0500   CO2 32 04/11/2014 1347   BUN 6 04/14/2014 0500   BUN 19 04/11/2014 1347   CREATININE 0.53 04/14/2014 0500    CREATININE 0.5* 04/11/2014 1347      Component Value Date/Time   CALCIUM 9.2 04/14/2014 0500   CALCIUM 10.0 04/11/2014 1347   ALKPHOS 281* 04/14/2014 0500   ALKPHOS 140* 04/11/2014 1347   AST 29 04/14/2014 0500   AST 41* 04/11/2014 1347   ALT 46 04/14/2014 0500   ALT 71* 04/11/2014 1347   BILITOT 1.0 04/14/2014 0500   BILITOT 1.20 04/11/2014 1347     Impression and Plan: [name] is a 24 year old gentleman with recent diagnosis of pancytopenia thought to be medication induced. He also has cerebral palsy and is bed/wheelchair bound. He has had no more fevers since his hospital admission. He is also more alert and seems to be feeling much better.  He shows no s/s of infection at this time. His WBC is 7.9. E will hold of on restarting his Neupogen for now. We will keep his appointment with Dr. Marin Olp next week.  His parents know to call here with any questions or concerns. We can certainly see him sooner if need be.    Eliezer Bottom, NP 3/15/201612:07 PM

## 2014-04-18 LAB — CULTURE, BLOOD (ROUTINE X 2)
CULTURE: NO GROWTH
Culture: NO GROWTH

## 2014-04-22 ENCOUNTER — Telehealth: Payer: Self-pay | Admitting: Hematology & Oncology

## 2014-04-22 NOTE — Telephone Encounter (Signed)
Mother left message wanting to know appointment this week. I left her message with 3-23 appointment

## 2014-04-24 ENCOUNTER — Encounter: Payer: Self-pay | Admitting: Hematology & Oncology

## 2014-04-24 ENCOUNTER — Ambulatory Visit (HOSPITAL_BASED_OUTPATIENT_CLINIC_OR_DEPARTMENT_OTHER): Payer: Medicaid Other

## 2014-04-24 ENCOUNTER — Other Ambulatory Visit (HOSPITAL_BASED_OUTPATIENT_CLINIC_OR_DEPARTMENT_OTHER): Payer: Medicaid Other | Admitting: Lab

## 2014-04-24 ENCOUNTER — Ambulatory Visit (HOSPITAL_BASED_OUTPATIENT_CLINIC_OR_DEPARTMENT_OTHER): Payer: Medicaid Other | Admitting: Hematology & Oncology

## 2014-04-24 VITALS — BP 113/63 | HR 146 | Temp 97.3°F | Resp 22 | Ht <= 58 in

## 2014-04-24 DIAGNOSIS — D61818 Other pancytopenia: Secondary | ICD-10-CM

## 2014-04-24 DIAGNOSIS — Z862 Personal history of diseases of the blood and blood-forming organs and certain disorders involving the immune mechanism: Secondary | ICD-10-CM

## 2014-04-24 DIAGNOSIS — R197 Diarrhea, unspecified: Secondary | ICD-10-CM

## 2014-04-24 DIAGNOSIS — D72829 Elevated white blood cell count, unspecified: Secondary | ICD-10-CM

## 2014-04-24 LAB — CBC WITH DIFFERENTIAL (CANCER CENTER ONLY)
BASO#: 0 10*3/uL (ref 0.0–0.2)
BASO%: 0.1 % (ref 0.0–2.0)
EOS ABS: 0 10*3/uL (ref 0.0–0.5)
EOS%: 0.3 % (ref 0.0–7.0)
HCT: 36 % — ABNORMAL LOW (ref 38.7–49.9)
HEMOGLOBIN: 11.7 g/dL — AB (ref 13.0–17.1)
LYMPH#: 0.7 10*3/uL — AB (ref 0.9–3.3)
LYMPH%: 5.6 % — ABNORMAL LOW (ref 14.0–48.0)
MCH: 30 pg (ref 28.0–33.4)
MCHC: 32.5 g/dL (ref 32.0–35.9)
MCV: 92 fL (ref 82–98)
MONO#: 0.7 10*3/uL (ref 0.1–0.9)
MONO%: 5.4 % (ref 0.0–13.0)
NEUT#: 11.2 10*3/uL — ABNORMAL HIGH (ref 1.5–6.5)
NEUT%: 88.6 % — ABNORMAL HIGH (ref 40.0–80.0)
RBC: 3.9 10*6/uL — AB (ref 4.20–5.70)
RDW: 16.4 % — ABNORMAL HIGH (ref 11.1–15.7)
WBC: 12.6 10*3/uL — AB (ref 4.0–10.0)

## 2014-04-24 LAB — CMP (CANCER CENTER ONLY)
ALT: 67 U/L — AB (ref 10–47)
AST: 35 U/L (ref 11–38)
Albumin: 3.7 g/dL (ref 3.3–5.5)
Alkaline Phosphatase: 157 U/L — ABNORMAL HIGH (ref 26–84)
BUN, Bld: 19 mg/dL (ref 7–22)
CO2: 29 mEq/L (ref 18–33)
CREATININE: 0.5 mg/dL — AB (ref 0.6–1.2)
Calcium: 9.5 mg/dL (ref 8.0–10.3)
Chloride: 101 mEq/L (ref 98–108)
Glucose, Bld: 100 mg/dL (ref 73–118)
POTASSIUM: 4.1 meq/L (ref 3.3–4.7)
Sodium: 147 mEq/L — ABNORMAL HIGH (ref 128–145)
Total Bilirubin: 1.2 mg/dl (ref 0.20–1.60)
Total Protein: 7.2 g/dL (ref 6.4–8.1)

## 2014-04-24 LAB — CHCC SATELLITE - SMEAR

## 2014-04-24 MED ORDER — SODIUM CHLORIDE 0.9 % IJ SOLN
10.0000 mL | INTRAMUSCULAR | Status: DC | PRN
  Administered 2014-04-24: 10 mL via INTRAVENOUS
  Filled 2014-04-24: qty 10

## 2014-04-24 MED ORDER — HEPARIN SOD (PORK) LOCK FLUSH 100 UNIT/ML IV SOLN
500.0000 [IU] | Freq: Once | INTRAVENOUS | Status: AC
  Administered 2014-04-24: 500 [IU] via INTRAVENOUS
  Filled 2014-04-24: qty 5

## 2014-04-24 NOTE — Progress Notes (Signed)
Hematology and Oncology Follow Up Visit  Jeffery [name] [id number] 04-07-1990 24 y.o. 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  Jeffery [name] is back for follow-up. He seems to be doing okay. His issue is tachycardia. His heart rate today was 146. He's had this for about a week or so. He is not symptomatic with it. He's not had a fever. He does have some diaphoresis. I don't know if this might be from one of the medications he is taking.  There is no issue with rashes. He's had no bleeding.  He is doing his tube feeds fairly well.  He's not had any obvious seizure activity. His Keppra has been discontinued.  He's had no obvious shortness of breath. He's had no cough. There's been no pain.  This probably is the best that I have seen him look since he has been out of the hospital.  He does have a baclofen pump which is helping with his spasms.  Medications:  Current outpatient prescriptions:  .  Acetaminophen (TYLENOL PO), Place 650 mg into feeding tube every 6 (six) hours as needed (fever)., Disp: , Rfl:  .  albuterol (PROVENTIL HFA;VENTOLIN HFA) 108 (90 BASE) MCG/ACT inhaler, Inhale 2 puffs into the lungs every 4 (four) hours as needed for shortness of breath., Disp: , Rfl:  .  albuterol (PROVENTIL) (2.5 MG/3ML) 0.083% nebulizer solution, Take 2.5 mg by nebulization. 1 ampule twice daily and as needed (making 4 doses daily), Disp: , Rfl:  .  Ascorbic Acid (VITAMIN C) 500 MG/5ML LIQD, 300 mg by PEG Tube route every morning. , Disp: , Rfl:  .  baclofen (GABLOFEN) 40000 MCG/20ML SOLN, 385.2 mcg by Intrathecal route continuous. , Disp: , Rfl:  .  calcium carbonate, dosed in mg elemental calcium, 1250 MG/5ML, 1,250 mg of elemental calcium by PEG Tube route 3 (three) times daily. 5 cc via peg tube, Disp: , Rfl:  .  cloBAZam (ONFI) 2.5 MG/ML solution, Place 2 mLs (5 mg total) into feeding  tube 2 (two) times daily., Disp: 120 mL, Rfl: 1 .  D-VITA 400 UNIT/ML LIQD, 2.5 mLs by Gastric Tube route daily. Via G-Tube Port, Disp: , Rfl: 5 .  DIASTAT ACUDIAL 20 MG GEL, Place 12.5 mg rectally as needed. For seizure lasting 2 minutes or longer or repetitive seizures.  No more than 1 dose every 12 hours or nasal versed (not both)., Disp: , Rfl: 5 .  dicyclomine (BENTYL) 10 MG/5ML syrup, Place into feeding tube every 8 (eight) hours as needed. Not to exceed 5 doses in 1 wk, Disp: , Rfl:  .  diphenoxylate-atropine (LOMOTIL) 2.5-0.025 MG/5ML liquid, Place 10 mLs into feeding tube 4 (four) times daily as needed for diarrhea or loose stools. 2 tsp via feeding tube q6h prn loose stools, Disp: , Rfl:  .  folic acid (FOLVITE) 1 MG tablet, 1 mg by Feeding Tube route daily. , Disp: , Rfl:  .  furosemide (LASIX) 10 MG/ML solution, Place 20 mg into feeding tube daily as needed for fluid. , Disp: , Rfl:  .  gabapentin (NEURONTIN) 250 MG/5ML solution, Take 18 mL 3 times daily (Patient taking differently: Place 825 mg into feeding tube 3 (three) times daily. Take 18 mL 3 times daily), Disp: 1620 mL, Rfl: 5 .  ibuprofen (ADVIL,MOTRIN) 100 MG/5ML suspension, Place 400 mg into feeding tube every 6 (six) hours as needed for fever (  pain)., Disp: , Rfl:  .  ketoconazole (NIZORAL) 2 % cream, Apply 1 application topically 2 (two) times daily as needed for irritation (yeast rash from antibiotics). , Disp: , Rfl:  .  lansoprazole (PREVACID SOLUTAB) 30 MG disintegrating tablet, 30 mg by Feeding Tube route 2 (two) times daily. 730am, Disp: , Rfl:  .  midazolam (VERSED) 5 MG/ML injection, Place 2 mLs (10 mg total) into the nose once. Draw up 24ml in 2 syringes. Remove blue vial access device. Attach syringe to nasal atomizer for intranasal administration. Give 50ml in right nostril x 2 for seizures lasting 2 minutes or longer or for repetitive seizures in a short period of time., Disp: 6 mL, Rfl: 3 .  Multiple Vitamins-Minerals  (MULTIVITAMIN) LIQD, 10 mLs by Feeding Tube route daily. , Disp: , Rfl:  .  Multiple Vitamins-Minerals (ZINC PO), 5 mLs by PEG Tube route daily. 244 mg / 5 mL zinc solution, Disp: , Rfl:  .  mupirocin ointment (BACTROBAN) 2 %, Apply 1 application topically as needed (to G-T site)., Disp: 22 g, Rfl: 1 .  Nutritional Supplements (PROMOD PO), 30 mLs by Feeding Tube route 2 (two) times daily. Noon and 10pm, Disp: , Rfl:  .  Nutritional Supplements (TWOCAL HN) LIQD, 237 mLs by Feeding Tube route. T.3 can at 46 cc hour x 12 hours - water 172 cc pre and post each and extra 172 bid., Disp: , Rfl:  .  OVER THE COUNTER MEDICATION, Apply 1 application topically as needed (after diaper changes). Balmex and Bag Balm paste made and applied after diaper changes, Disp: , Rfl:  .  OXYGEN-HELIUM IN, Inhale into the lungs. Oxygen PRN to keep O2 Sat at 90%, Disp: , Rfl:  .  potassium chloride (KLOR-CON) 20 MEQ packet, 20 mEq by Feeding Tube route daily. , Disp: , Rfl:  .  Pseudoephedrine HCl (SUDAFED CHILDRENS) 15 MG/5ML LIQD, Give 90 mg by tube 3 (three) times daily. , Disp: , Rfl:  .  simethicone (MYLICON) 259 MG chewable tablet, 125 mg by Feeding Tube route every 6 (six) hours as needed for flatulence. , Disp: , Rfl:  .  sodium phosphate (FLEET) enema, Place 1 enema rectally. follow package directions PRN, Disp: , Rfl:  .  sucralfate (CARAFATE) 1 GM/10ML suspension, Place 1 g into feeding tube 4 (four) times daily. 7am, 130pm, 5pm, 8pm, Disp: , Rfl:  .  Water For Irrigation, Sterile (FREE WATER) SOLN, Place 172 mLs into feeding tube 4 (four) times daily., Disp: , Rfl:  .  zinc oxide (BALMEX) 11.3 % CREA cream, Apply 1 application topically 5 (five) times daily. With each diaper change., Disp: , Rfl:  No current facility-administered medications for this visit.  Facility-Administered Medications Ordered in Other Visits:  .  sodium chloride 0.9 % injection 10 mL, 10 mL, Intravenous, PRN, Volanda Napoleon, MD, 10 mL at  04/24/14 1510  Allergies:  Allergies  Allergen Reactions  . Vimpat [Lacosamide] Rash  . Adhesive [Tape]     Rips skin off  . Depakote [Divalproex Sodium]     Causes pancreatitis   . Keppra [Levetiracetam] Other (See Comments)    Bone marrow suppression  . Amoxicillin-Pot Clavulanate Rash    Give w fluconazole then no rash    Past Medical History, Surgical history, Social history, and Family History were reviewed and updated.  Review of Systems: As above  Physical Exam:  height is 4\' 4"  (1.321 m). His oral temperature is 97.3 F (36.3 C). His  blood pressure is 113/63 and his pulse is 146. His respiration is 22.   Debilitated young male. He has obvious changes consistent with cerebral palsy. He has no obvious oral lesions. He has no adenopathy. Lungs are clear. Cardiac exam tachycardic rate and regular rhythm with no murmurs, rubs or bruits. Abdomen is soft. He is slightly obese. He has no obvious hepatosplenomegaly. Extremities shows joint changes consistent  with his cerebral palsy. He does have an implantable baclofen pump in the right lower quadrant of his abdomen. Skin exam shows no obvious rashes. There is no ecchymoses or petechia. Neurological exam shows the spasticity and dysarthria from his cerebral palsy  Lab Results  Component Value Date   WBC 12.6* 04/24/2014   HGB 11.7* 04/24/2014   HCT 36.0* 04/24/2014   MCV 92 04/24/2014   PLT 107 Platelet count consistent in citrate* 04/24/2014     Chemistry      Component Value Date/Time   NA 147* 04/24/2014 1339   NA 143 04/16/2014 1211   K 4.1 04/24/2014 1339   K 3.8 04/16/2014 1211   CL 101 04/24/2014 1339   CL 99 04/16/2014 1211   CO2 29 04/24/2014 1339   CO2 29 04/16/2014 1211   BUN 19 04/24/2014 1339   BUN 11 04/16/2014 1211   CREATININE 0.5* 04/24/2014 1339   CREATININE 0.43* 04/16/2014 1211      Component Value Date/Time   CALCIUM 9.5 04/24/2014 1339   CALCIUM 9.4 04/16/2014 1211   ALKPHOS 157* 04/24/2014  1339   ALKPHOS 197* 04/16/2014 1211   AST 35 04/24/2014 1339   AST 21 04/16/2014 1211   ALT 67* 04/24/2014 1339   ALT 42 04/16/2014 1211   BILITOT 1.20 04/24/2014 1339   BILITOT 0.7 04/16/2014 1211     Normochromic and normocytic red blood cells. I see no nucleated red cells. There is no teardrop cells. There is no rouleau formation. There is no inclusion bodies. White cells. Normal in morphology and maturation. There are no hypersegmented polys. There is no immature myeloid or lymphoid forms. Platelets are mildly decreased in number.    Impression and Plan: Mr. Hallstrom is 24 year old male. He had pancytopenia and febrile neutropenia.  He seems to be doing pretty well right now. He has this tachycardia. It is possible that this might be some type of neurological issue. He is not febrile. He certainly is not neutropenic. His blood smear looks good under the microscope.  I think at this point, we probably get him back in about 3 weeks now. I'm just glad that his white cell count has stabilized. Again, I am not sure as to what is going on with the tachycardia. His mom said that they saw his family doctor last week and he was not too worried about this. Volanda Napoleon, MD 3/23/20166:14 PM

## 2014-05-01 ENCOUNTER — Ambulatory Visit (INDEPENDENT_AMBULATORY_CARE_PROVIDER_SITE_OTHER): Payer: Medicaid Other | Admitting: Pediatrics

## 2014-05-01 VITALS — BP 104/74 | HR 72

## 2014-05-01 DIAGNOSIS — Z862 Personal history of diseases of the blood and blood-forming organs and certain disorders involving the immune mechanism: Secondary | ICD-10-CM | POA: Diagnosis not present

## 2014-05-01 DIAGNOSIS — R1310 Dysphagia, unspecified: Secondary | ICD-10-CM | POA: Diagnosis not present

## 2014-05-01 DIAGNOSIS — G40319 Generalized idiopathic epilepsy and epileptic syndromes, intractable, without status epilepticus: Secondary | ICD-10-CM

## 2014-05-01 DIAGNOSIS — G808 Other cerebral palsy: Secondary | ICD-10-CM | POA: Diagnosis not present

## 2014-05-01 DIAGNOSIS — G40311 Generalized idiopathic epilepsy and epileptic syndromes, intractable, with status epilepticus: Secondary | ICD-10-CM | POA: Diagnosis not present

## 2014-05-01 DIAGNOSIS — M25559 Pain in unspecified hip: Secondary | ICD-10-CM

## 2014-05-01 DIAGNOSIS — G40219 Localization-related (focal) (partial) symptomatic epilepsy and epileptic syndromes with complex partial seizures, intractable, without status epilepticus: Secondary | ICD-10-CM | POA: Diagnosis not present

## 2014-05-01 MED ORDER — CLOBAZAM 2.5 MG/ML PO SUSP: ORAL | Status: DC

## 2014-05-01 MED ORDER — GABAPENTIN 250 MG/5ML PO SOLN: ORAL | Status: DC
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
## 2014-05-02 NOTE — Telephone Encounter (Signed)
Final blood cultures 04/12/14 neg  Savva Beamer, MD, FACP, FHM. [facility] Pager [phone]  If 7PM-7AM, please contact night-coverage [email or web address] Password [facility] 05/02/2014, 1:57 PM

## 2014-05-09 ENCOUNTER — Encounter: Payer: Self-pay | Admitting: Neurology

## 2014-05-09 ENCOUNTER — Encounter: Payer: Self-pay | Admitting: *Deleted

## 2014-05-09 ENCOUNTER — Ambulatory Visit (INDEPENDENT_AMBULATORY_CARE_PROVIDER_SITE_OTHER): Payer: Medicaid Other | Admitting: Neurology

## 2014-05-09 VITALS — BP 102/66 | HR 103

## 2014-05-09 DIAGNOSIS — G40319 Generalized idiopathic epilepsy and epileptic syndromes, intractable, without status epilepticus: Secondary | ICD-10-CM

## 2014-05-09 DIAGNOSIS — G40311 Generalized idiopathic epilepsy and epileptic syndromes, intractable, with status epilepticus: Secondary | ICD-10-CM

## 2014-05-09 DIAGNOSIS — G808 Other cerebral palsy: Secondary | ICD-10-CM

## 2014-05-09 MED ORDER — ONABOTULINUMTOXINA 100 UNITS IJ SOLR: 300.0000 [IU] | Freq: Once | INTRAMUSCULAR | Status: DC

## 2014-05-09 NOTE — Progress Notes (Signed)
HPI:   Jeffery [name] came in for EMG guided BOTOX injection for bilateral upper extremity spasticity, accompanied by his mother.  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 His mother adopt him and his twin sister since 46 months old.  He is wheelchair bound, depend on PEG tube feeding, he can communicate some with facial expression, but non verbal. He has severe nonfunctional spastic qudraplegia, s/p baclofen bump placement at [facility]. Dr Gaynell Face refills his Baclofen bump, which helps his lower extremity spasticity, it is much easier for care giver to change his diaper, other wise he has frequent bilateral lower extremity spasticity, clonus, and hip pain.  He has been receiving BOTOX A injection from [facility] Dr. Haynes Kerns since 2000, every 3-4 months, it has been very helpful, his upper extremity is more relaxed, it is easier to put on cloth and clean his hands after injection, last injection by Dr. Haynes Kerns was in 09/26/2009, 300 units were injected into bilateral biceps, brachioradialis, pronator teres, ECU, FDP, mother wants to change care locally for convience.  Mother reported benefit usually lasts less than 3 months, she would then noticed increased difficulty to clean his hands, he also had some improvement with his grip with BOTOX A,   He also has bilateral forearm deep tendon release surgery for his bilateral hands finger flexions in the summer of 2014  May 09 2014: Last EMG guided Botox injection was seen November 2015, he has developed pancytopenia,end of 2015, had extensive hematology workup, was eventually corrected by with draw  Keppra and Vimpat, with bone marrow stimulation medications  He is now bounced back nicely to his baseline, mother noticed increased bilateral upper extremity spasticity,  especially his left hand, he used to use his left hand to hold his Candy  He is now taking Onfi for his seizure, continue has multiple recurrent seizure each week, this has been fairly stable, his seizure is under the care of Dr. Gaynell Face,   Physical Exam     Head:   microcephaly, dysmorphic features  Neurologic Exam  Mental Status: sits in wheelchair, mute, does not follow commands Cranial Nerves:  symmetric facial grimace.  Motor:  He has fixed contraction of bilateral elbow, only few upper extremity spontaneous activities, well healed bilateral forearm scar, fixed forceful finger flexion of bilateral hand.  Bilateral lower extrmity has moderate spasticity on passive movement, fixed contraction of bilateral knee joints, no spontaneous movement of bilateral lower extremity. Coordination:  cannot test Gait and Station:  wheelchair-bound    Assessement and Plan:  24 yo male with cerebral palsy, severe mental retardation, epilepsy  Electrical stimulation guided, BOTOX A injection for bilateral upper extremity spasticity, he has Baclofen pump, which has helped his lower extremity spasticity.  300 units were used, 100 units were dissovled into 2 cc of NS.   Right biceps 25 units Right brachialis 25 units Right pronator teres 25units Right flexor carpi ulnaris 25 Right flexor digitorum profundus 25 Right flexor digitorum superficialis 25    Left brachialis 25x3=75 units   Left lumbricals divided into 3 injections for total of 25 units Left flexor digital profundus 25  Left flexor digitorum superficialis 25 units   He tolerate the injection very well, will return to clinic in 3 months, for repeat injection.  Marcial Pacas, M.D. Ph.D.  [facility] [place], [place] [address] Phone: [phone] Fax:  [phone] 

## 2014-05-10 ENCOUNTER — Inpatient Hospital Stay (HOSPITAL_COMMUNITY)
Admission: EM | Admit: 2014-05-10 | Discharge: 2014-05-11 | DRG: 808 | Disposition: A | Discharge status: Home / Self Care | Payer: Medicaid Other | Attending: Internal Medicine | Admitting: Internal Medicine

## 2014-05-10 ENCOUNTER — Telehealth: Payer: Self-pay | Admitting: *Deleted

## 2014-05-10 ENCOUNTER — Encounter: Payer: Self-pay | Admitting: *Deleted

## 2014-05-10 ENCOUNTER — Telehealth: Payer: Self-pay | Admitting: Family

## 2014-05-10 DIAGNOSIS — Z881 Allergy status to other antibiotic agents status: Secondary | ICD-10-CM

## 2014-05-10 DIAGNOSIS — Z862 Personal history of diseases of the blood and blood-forming organs and certain disorders involving the immune mechanism: Secondary | ICD-10-CM

## 2014-05-10 DIAGNOSIS — K219 Gastro-esophageal reflux disease without esophagitis: Secondary | ICD-10-CM | POA: Diagnosis present

## 2014-05-10 DIAGNOSIS — G808 Other cerebral palsy: Secondary | ICD-10-CM | POA: Diagnosis present

## 2014-05-10 DIAGNOSIS — M810 Age-related osteoporosis without current pathological fracture: Secondary | ICD-10-CM | POA: Diagnosis present

## 2014-05-10 DIAGNOSIS — G40319 Generalized idiopathic epilepsy and epileptic syndromes, intractable, without status epilepticus: Secondary | ICD-10-CM | POA: Diagnosis present

## 2014-05-10 DIAGNOSIS — G40219 Localization-related (focal) (partial) symptomatic epilepsy and epileptic syndromes with complex partial seizures, intractable, without status epilepticus: Secondary | ICD-10-CM | POA: Diagnosis present

## 2014-05-10 DIAGNOSIS — R5383 Other fatigue: Secondary | ICD-10-CM

## 2014-05-10 DIAGNOSIS — Z888 Allergy status to other drugs, medicaments and biological substances status: Secondary | ICD-10-CM

## 2014-05-10 DIAGNOSIS — M4140 Neuromuscular scoliosis, site unspecified: Secondary | ICD-10-CM | POA: Diagnosis present

## 2014-05-10 DIAGNOSIS — Z931 Gastrostomy status: Secondary | ICD-10-CM

## 2014-05-10 DIAGNOSIS — G800 Spastic quadriplegic cerebral palsy: Secondary | ICD-10-CM | POA: Diagnosis present

## 2014-05-10 DIAGNOSIS — D61818 Other pancytopenia: Principal | ICD-10-CM | POA: Diagnosis present

## 2014-05-10 DIAGNOSIS — G4733 Obstructive sleep apnea (adult) (pediatric): Secondary | ICD-10-CM | POA: Diagnosis present

## 2014-05-10 DIAGNOSIS — R0981 Nasal congestion: Secondary | ICD-10-CM | POA: Diagnosis present

## 2014-05-10 DIAGNOSIS — R1310 Dysphagia, unspecified: Secondary | ICD-10-CM | POA: Diagnosis present

## 2014-05-10 DIAGNOSIS — Z8673 Personal history of transient ischemic attack (TIA), and cerebral infarction without residual deficits: Secondary | ICD-10-CM

## 2014-05-10 MED ORDER — ONABOTULINUMTOXINA 100 UNITS IJ SOLR
300.0000 [IU] | Freq: Once | INTRAMUSCULAR | Status: AC
  Administered 2014-05-10: 300 [IU] via INTRAMUSCULAR

## 2014-05-10 NOTE — Telephone Encounter (Addendum)
[name] left a message about Jeffery [name], saying that he had an episode of bradycardia during sleep, and that since yesterday evening he is less alert. She not seen any seizures. She plans to call PCP and ask for hemoglobin level to be drawn. Mom can be reached at [phone]. I called Mom and she said that he is pale and doesn't seem to feel right. She said that after calling here, she talked with the "bone marrow" dr office, and a stat CBC was collected by home care nurse. She just wanted to let Dr Gaynell Face know in case it turns out to be medicine related. TG

## 2014-05-10 NOTE — Telephone Encounter (Signed)
I agree with this plan.  I spoke with mother.  I asked her to call on Monday to let us know how things work out.  I think it is entirely likely that he still has not fully recovered from his aplastic anemia.

## 2014-05-10 NOTE — Telephone Encounter (Signed)
Mother says patient is 'off' and she wants a CBC drawn by the home health RN. Spoke to [facility] NP and order called into [place] for CBC to be drawn at home. Order given to [facility]

## 2014-05-11 ENCOUNTER — Emergency Department (HOSPITAL_COMMUNITY): Payer: Medicaid Other

## 2014-05-11 ENCOUNTER — Encounter (HOSPITAL_COMMUNITY): Payer: Self-pay | Admitting: Emergency Medicine

## 2014-05-11 DIAGNOSIS — M4140 Neuromuscular scoliosis, site unspecified: Secondary | ICD-10-CM

## 2014-05-11 DIAGNOSIS — G4733 Obstructive sleep apnea (adult) (pediatric): Secondary | ICD-10-CM | POA: Diagnosis present

## 2014-05-11 DIAGNOSIS — D61818 Other pancytopenia: Principal | ICD-10-CM

## 2014-05-11 DIAGNOSIS — G40219 Localization-related (focal) (partial) symptomatic epilepsy and epileptic syndromes with complex partial seizures, intractable, without status epilepticus: Secondary | ICD-10-CM | POA: Diagnosis present

## 2014-05-11 DIAGNOSIS — M810 Age-related osteoporosis without current pathological fracture: Secondary | ICD-10-CM | POA: Diagnosis present

## 2014-05-11 DIAGNOSIS — Z931 Gastrostomy status: Secondary | ICD-10-CM | POA: Diagnosis not present

## 2014-05-11 DIAGNOSIS — G808 Other cerebral palsy: Secondary | ICD-10-CM

## 2014-05-11 DIAGNOSIS — G800 Spastic quadriplegic cerebral palsy: Secondary | ICD-10-CM | POA: Diagnosis present

## 2014-05-11 DIAGNOSIS — Z888 Allergy status to other drugs, medicaments and biological substances status: Secondary | ICD-10-CM | POA: Diagnosis not present

## 2014-05-11 DIAGNOSIS — G40311 Generalized idiopathic epilepsy and epileptic syndromes, intractable, with status epilepticus: Secondary | ICD-10-CM | POA: Diagnosis not present

## 2014-05-11 DIAGNOSIS — Z8673 Personal history of transient ischemic attack (TIA), and cerebral infarction without residual deficits: Secondary | ICD-10-CM | POA: Diagnosis not present

## 2014-05-11 DIAGNOSIS — R1310 Dysphagia, unspecified: Secondary | ICD-10-CM | POA: Diagnosis present

## 2014-05-11 DIAGNOSIS — K219 Gastro-esophageal reflux disease without esophagitis: Secondary | ICD-10-CM | POA: Diagnosis present

## 2014-05-11 DIAGNOSIS — Z881 Allergy status to other antibiotic agents status: Secondary | ICD-10-CM | POA: Diagnosis not present

## 2014-05-11 DIAGNOSIS — R5383 Other fatigue: Secondary | ICD-10-CM | POA: Diagnosis present

## 2014-05-11 LAB — CBC WITH DIFFERENTIAL/PLATELET
Basophils Absolute: 0 10*3/uL (ref 0.0–0.1)
Basophils Relative: 1 % (ref 0–1)
EOS PCT: 5 % (ref 0–5)
Eosinophils Absolute: 0.1 10*3/uL (ref 0.0–0.7)
HCT: 24.9 % — ABNORMAL LOW (ref 39.0–52.0)
HEMOGLOBIN: 8 g/dL — AB (ref 13.0–17.0)
LYMPHS PCT: 42 % (ref 12–46)
Lymphs Abs: 0.6 10*3/uL — ABNORMAL LOW (ref 0.7–4.0)
MCH: 31.3 pg (ref 26.0–34.0)
MCHC: 32.1 g/dL (ref 30.0–36.0)
MCV: 97.3 fL (ref 78.0–100.0)
Monocytes Absolute: 0.3 10*3/uL (ref 0.1–1.0)
Monocytes Relative: 19 % — ABNORMAL HIGH (ref 3–12)
Neutro Abs: 0.5 10*3/uL — ABNORMAL LOW (ref 1.7–7.7)
Neutrophils Relative %: 33 % — ABNORMAL LOW (ref 43–77)
Platelets: 132 10*3/uL — ABNORMAL LOW (ref 150–400)
RBC: 2.56 MIL/uL — ABNORMAL LOW (ref 4.22–5.81)
RDW: 22.9 % — AB (ref 11.5–15.5)
WBC: 1.5 10*3/uL — ABNORMAL LOW (ref 4.0–10.5)

## 2014-05-11 LAB — COMPREHENSIVE METABOLIC PANEL
ALBUMIN: 3.8 g/dL (ref 3.5–5.2)
ALBUMIN: 3.6 g/dL (ref 3.5–5.2)
ALT: 68 U/L — ABNORMAL HIGH (ref 0–53)
ALT: 59 U/L — ABNORMAL HIGH (ref 0–53)
ANION GAP: 14 (ref 5–15)
AST: 42 U/L — ABNORMAL HIGH (ref 0–37)
AST: 33 U/L (ref 0–37)
Alkaline Phosphatase: 132 U/L — ABNORMAL HIGH (ref 39–117)
Alkaline Phosphatase: 127 U/L — ABNORMAL HIGH (ref 39–117)
Anion gap: 11 (ref 5–15)
BUN: 10 mg/dL (ref 6–23)
BUN: 9 mg/dL (ref 6–23)
CO2: 26 mmol/L (ref 19–32)
CO2: 30 mmol/L (ref 19–32)
CREATININE: 0.5 mg/dL (ref 0.50–1.35)
Calcium: 9.5 mg/dL (ref 8.4–10.5)
Calcium: 9.3 mg/dL (ref 8.4–10.5)
Chloride: 97 mmol/L (ref 96–112)
Chloride: 99 mmol/L (ref 96–112)
Creatinine, Ser: 0.54 mg/dL (ref 0.50–1.35)
GFR calc Af Amer: >90 mL/min (ref >90)
GFR calc non Af Amer: >90 mL/min (ref >90)
GLUCOSE: 106 mg/dL — AB (ref 70–99)
Glucose, Bld: 90 mg/dL (ref 70–99)
POTASSIUM: 3.8 mmol/L (ref 3.5–5.1)
Potassium: 4 mmol/L (ref 3.5–5.1)
SODIUM: 140 mmol/L (ref 135–145)
Sodium: 137 mmol/L (ref 135–145)
Total Bilirubin: 1.3 mg/dL — ABNORMAL HIGH (ref 0.3–1.2)
Total Bilirubin: 1.5 mg/dL — ABNORMAL HIGH (ref 0.3–1.2)
Total Protein: 6.7 g/dL (ref 6.0–8.3)
Total Protein: 6.4 g/dL (ref 6.0–8.3)

## 2014-05-11 LAB — CBC
HCT: 29.1 % — ABNORMAL LOW (ref 39.0–52.0)
HEMOGLOBIN: 9.4 g/dL — AB (ref 13.0–17.0)
MCH: 30.6 pg (ref 26.0–34.0)
MCHC: 32.3 g/dL (ref 30.0–36.0)
MCV: 94.8 fL (ref 78.0–100.0)
Platelets: 134 10*3/uL — ABNORMAL LOW (ref 150–400)
RBC: 3.07 MIL/uL — AB (ref 4.22–5.81)
RDW: 20.5 % — ABNORMAL HIGH (ref 11.5–15.5)
WBC: 1.2 10*3/uL — AB (ref 4.0–10.5)

## 2014-05-11 LAB — URINALYSIS, ROUTINE W REFLEX MICROSCOPIC
Bilirubin Urine: NEGATIVE
Glucose, UA: NEGATIVE mg/dL
HGB URINE DIPSTICK: NEGATIVE
KETONES UR: NEGATIVE mg/dL
Leukocytes, UA: NEGATIVE
Nitrite: NEGATIVE
Protein, ur: 100 mg/dL — AB
SPECIFIC GRAVITY, URINE: 1.025 (ref 1.005–1.030)
Urobilinogen, UA: 1 mg/dL (ref 0.0–1.0)
pH: 7 (ref 5.0–8.0)

## 2014-05-11 LAB — PREPARE RBC (CROSSMATCH)

## 2014-05-11 LAB — URINE MICROSCOPIC-ADD ON

## 2014-05-11 LAB — PROTIME-INR
INR: 1.04 (ref 0.00–1.49)
Prothrombin Time: 13.7 seconds (ref 11.6–15.2)

## 2014-05-11 LAB — GLUCOSE, CAPILLARY: Glucose-Capillary: 112 mg/dL — ABNORMAL HIGH (ref 70–99)

## 2014-05-11 MED ORDER — FUROSEMIDE 10 MG/ML IJ SOLN
20.0000 mg | Freq: Once | INTRAMUSCULAR | Status: AC
  Administered 2014-05-11: 20 mg via INTRAVENOUS
  Filled 2014-05-11: qty 2

## 2014-05-11 MED ORDER — FUROSEMIDE 10 MG/ML PO SOLN
20.0000 mg | Freq: Every day | ORAL | Status: DC | PRN
  Filled 2014-05-11: qty 2

## 2014-05-11 MED ORDER — IBUPROFEN 100 MG/5ML PO SUSP
400.0000 mg | Freq: Four times a day (QID) | ORAL | Status: DC | PRN
  Filled 2014-05-11: qty 20

## 2014-05-11 MED ORDER — ZINC OXIDE 11.3 % EX CREA
1.0000 "application " | TOPICAL_CREAM | Freq: Every day | CUTANEOUS | Status: DC
  Administered 2014-05-11 (×2): 1 via TOPICAL
  Filled 2014-05-11: qty 56

## 2014-05-11 MED ORDER — PRO-STAT SUGAR FREE PO LIQD: 30.0000 mL | ORAL | Status: DC

## 2014-05-11 MED ORDER — MIDAZOLAM 5 MG/ML PEDIATRIC INJ FOR INTRANASAL/SUBLINGUAL USE: 10.0000 mg | Freq: Once | INTRAMUSCULAR | Status: DC | PRN

## 2014-05-11 MED ORDER — SODIUM CHLORIDE 0.9 % IJ SOLN
10.0000 mL | INTRAMUSCULAR | Status: DC | PRN
  Administered 2014-05-11: 10 mL
  Filled 2014-05-11: qty 40

## 2014-05-11 MED ORDER — FILGRASTIM 300 MCG/ML IJ SOLN
300.0000 ug | Freq: Once | INTRAMUSCULAR | Status: DC
  Administered 2014-05-11: 300 ug via SUBCUTANEOUS

## 2014-05-11 MED ORDER — TWOCAL HN PO LIQD: 552.0000 mL | ORAL | Status: DC

## 2014-05-11 MED ORDER — PANTOPRAZOLE SODIUM 40 MG PO PACK
40.0000 mg | PACK | Freq: Two times a day (BID) | ORAL | Status: DC
  Administered 2014-05-11: 40 mg
  Filled 2014-05-11 (×3): qty 20

## 2014-05-11 MED ORDER — DIAZEPAM 20 MG RE GEL
12.5000 mg | RECTAL | Status: DC | PRN
  Filled 2014-05-11: qty 20

## 2014-05-11 MED ORDER — MUPIROCIN 2 % EX OINT: 1.0000 "application " | TOPICAL_OINTMENT | CUTANEOUS | Status: DC | PRN

## 2014-05-11 MED ORDER — SODIUM CHLORIDE 0.9 % IV SOLN: Freq: Once | INTRAVENOUS | Status: DC

## 2014-05-11 MED ORDER — SUCRALFATE 1 GM/10ML PO SUSP
1.0000 g | ORAL | Status: DC
  Administered 2014-05-11 (×2): 1 g
  Filled 2014-05-11 (×5): qty 10

## 2014-05-11 MED ORDER — CALCIUM CARBONATE 1250 MG/5ML PO SUSP: 1250.0000 mg | Freq: Three times a day (TID) | ORAL | Status: DC

## 2014-05-11 MED ORDER — BACLOFEN 40000 MCG/20ML IT SOLN: 385.2000 ug | INTRATHECAL | Status: DC

## 2014-05-11 MED ORDER — ENSURE ENLIVE PO LIQD: 237.0000 mL | ORAL | Status: DC

## 2014-05-11 MED ORDER — ADULT MULTIVITAMIN LIQUID CH
10.0000 mL | Freq: Every day | ORAL | Status: DC
  Filled 2014-05-11: qty 10

## 2014-05-11 MED ORDER — FILGRASTIM 300 MCG/ML IJ SOLN
300.0000 ug | Freq: Once | INTRAMUSCULAR | Status: DC
  Filled 2014-05-11: qty 1

## 2014-05-11 MED ORDER — FOLIC ACID 1 MG PO TABS
1.0000 mg | ORAL_TABLET | Freq: Every day | ORAL | Status: DC
  Filled 2014-05-11: qty 1

## 2014-05-11 MED ORDER — ALBUTEROL SULFATE (2.5 MG/3ML) 0.083% IN NEBU
3.0000 mL | INHALATION_SOLUTION | RESPIRATORY_TRACT | Status: DC | PRN
  Administered 2014-05-11: 3 mL via RESPIRATORY_TRACT
  Filled 2014-05-11: qty 3

## 2014-05-11 MED ORDER — KETOCONAZOLE 2 % EX CREA: 1.0000 "application " | TOPICAL_CREAM | Freq: Two times a day (BID) | CUTANEOUS | Status: DC | PRN

## 2014-05-11 MED ORDER — PSEUDOEPHEDRINE HCL 30 MG/5ML PO LIQD
90.0000 mg | Freq: Three times a day (TID) | ORAL | Status: DC
  Filled 2014-05-11 (×3): qty 15

## 2014-05-11 MED ORDER — SIMETHICONE 80 MG PO CHEW
160.0000 mg | CHEWABLE_TABLET | Freq: Four times a day (QID) | ORAL | Status: DC | PRN
  Filled 2014-05-11: qty 2

## 2014-05-11 MED ORDER — POTASSIUM CHLORIDE 20 MEQ/15ML (10%) PO SOLN
20.0000 meq | Freq: Every day | ORAL | Status: DC
  Administered 2014-05-11: 20 meq
  Filled 2014-05-11: qty 15

## 2014-05-11 MED ORDER — CLOBAZAM 2.5 MG/ML PO SUSP
6.2500 mg | Freq: Two times a day (BID) | ORAL | Status: DC
  Administered 2014-05-11: 6.25 mg
  Filled 2014-05-11: qty 4

## 2014-05-11 MED ORDER — SODIUM CHLORIDE 0.9 % IJ SOLN: 3.0000 mL | Freq: Two times a day (BID) | INTRAMUSCULAR | Status: DC

## 2014-05-11 MED ORDER — HEPARIN SOD (PORK) LOCK FLUSH 100 UNIT/ML IV SOLN
500.0000 [IU] | INTRAVENOUS | Status: AC | PRN
  Administered 2014-05-11: 500 [IU]

## 2014-05-11 MED ORDER — HEPARIN SODIUM (PORCINE) 5000 UNIT/ML IJ SOLN
5000.0000 [IU] | Freq: Three times a day (TID) | INTRAMUSCULAR | Status: DC
  Administered 2014-05-11 (×2): 5000 [IU] via SUBCUTANEOUS

## 2014-05-11 MED ORDER — GABAPENTIN 250 MG/5ML PO SOLN
900.0000 mg | Freq: Three times a day (TID) | ORAL | Status: DC
  Administered 2014-05-11 (×2): 900 mg
  Filled 2014-05-11 (×3): qty 18

## 2014-05-11 MED ORDER — VITAMIN C 500 MG/5ML PO SYRP
300.0000 mg | ORAL_SOLUTION | Freq: Every morning | ORAL | Status: DC
  Filled 2014-05-11: qty 3

## 2014-05-11 MED ORDER — FLEET ENEMA 7-19 GM/118ML RE ENEM
1.0000 | ENEMA | RECTAL | Status: DC | PRN
  Filled 2014-05-11: qty 1

## 2014-05-11 MED ORDER — FREE WATER
172.0000 mL | Freq: Four times a day (QID) | Status: DC
  Administered 2014-05-11: 172 mL

## 2014-05-11 MED ORDER — DICYCLOMINE HCL 10 MG/5ML PO SOLN
10.0000 mg | Freq: Three times a day (TID) | ORAL | Status: DC
  Filled 2014-05-11 (×4): qty 5

## 2014-05-11 NOTE — ED Notes (Signed)
Pts mother states that pt is behind in fluids by about 430 cc.

## 2014-05-11 NOTE — ED Provider Notes (Signed)
CSN: [id number]     Arrival date & time 05/10/14  2359 History  This chart was scribed for [name], MD by Jeanell Sparrow, ED Scribe. This patient was seen in room B14C/B14C and the patient's care was started at 12:41 AM.   Chief Complaint  Patient presents with  . Weakness   The history is provided by a parent. No language interpreter was used.   HPI Comments: Jeffery [name] is a 24 y.o. male with a hx anemia who presents to the Emergency Department complaining of constant moderate weakness that started last night. Mother reports that pt was fine yesterday afternoon. She states that pt was complaining of fatigue last night and wanted to go to bed early, which is unusual for him. She reports that pt woke up fussy with a pale face. She reports no modifying factors. She states that pt was doing well since the last ED visit until last night.   Past Medical History  Diagnosis Date  . CP (cerebral palsy), spastic, quadriplegic   . Osteoporosis   . Undescended testes   . Seasonal allergies   . IVH (intraventricular hemorrhage) April 07, 1990    Grade IV  . Hip dislocation, bilateral   . Dysphagia   . Retinopathy of prematurity   . 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 Neuromuscular scoliosis   . Osteoporosis   . Complex partial seizures   . Generalized convulsive epilepsy without mention of intractable epilepsy   . Sinus bradycardia     HR drops to 38-40 while sleeping  . Blister of right heel     fluid filled; origin unknown  . Kidney stones     ?  Marland Kitchen Pneumonia      chronic pneumonia ,respitory failure dx Augest 2014  . Aspiration pneumonia     "chronic" (04/12/2014)  . OSA treated with BiPAP     "since age 17"   . Anemia   . 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 GERD (gastroesophageal reflux disease)   . Epilepsy   . Spastic quadriplegia    Past Surgical History  Procedure Laterality Date  . Mole removal   "several B/T 2008-2010"    "from all over"  . Jejunostomy feeding tube  03/08/2013; ~ 09/2013; ~ 01/2014    "transgastric-jujunal feeding tube"  . Gastrostomy tube placement  11./02/1998       . Button change  12/15/2010    Procedure: BUTTON CHANGE;  Surgeon: Gatha Mayer, MD;  Location: [facility];  Service: Endoscopy;  Laterality: N/A;  . Peg placement  10/07/2011    Procedure: PERCUTANEOUS ENDOSCOPIC GASTROSTOMY (PEG) REPLACEMENT;  Surgeon: Lafayette Dragon, MD;  Location: [facility];  Service: Endoscopy;  Laterality: N/A;  Needs 18 F 2.5 button ordered-dl  . Inguinal hernia repair Bilateral 1992  . Retinopathy of prematurity surgery  1992  . Achilles tendon lengthening Bilateral 12/1998  . Tendon release  12/1998    Soft tissue releases  wrists and fingers [Other]  . Infusion pump implantation  07/25/2000; 2013    Intrathecal baclofen pump  . Peg placement N/A 06/05/2012    Procedure: PERCUTANEOUS ENDOSCOPIC GASTROSTOMY (PEG) REPLACEMENT;  Surgeon: Lafayette Dragon, MD;  Location: [facility];  Service: Endoscopy;  Laterality: N/A;  button 30fr.2.5cm  . Strabismus surgery Bilateral 1993    "3 on right; 2 on left"  . Back surgery  ~ 2008    Harrington Rods  in back needs to be log rolled  . Tendon repair Bilateral 09/05/2012    Procedure: LENGTHENING OF DIGITAL FLEXOR TENDONS BILTERAL HANDS;  Surgeon: Jolyn Nap, MD;  Location: [facility];  Service: Orthopedics;  Laterality: Bilateral;  . Peg placement N/A 09/13/2012    Procedure: PERCUTANEOUS ENDOSCOPIC GASTROSTOMY (PEG) REPLACEMENT;  Surgeon: Lafayette Dragon, MD;  Location: [facility];  Service: Endoscopy;  Laterality: N/A;  . Flexible sigmoidoscopy N/A 10/30/2012    Procedure: FLEXIBLE SIGMOIDOSCOPY;  Surgeon: Cleotis Nipper, MD;  Location: [facility];  Service: Endoscopy;  Laterality: N/A;  . Peg placement N/A 11/15/2012    Procedure: PERCUTANEOUS ENDOSCOPIC GASTROSTOMY (PEG) REPLACEMENT;  Surgeon: Jeryl Columbia, MD;  Location: [facility]  ENDOSCOPY;  Service: Endoscopy;  Laterality: N/A;  . Gastrostomy tube placement  11./02/1998  . Rectal biopsy  10/29/2012    S/P diarrhea from Vancomycin  . Portacath placement Right 11/25/2012    chest  . Eye surgery     Family History  Problem Relation Age of Onset  . Adopted: Yes   History  Substance Use Topics  . Smoking status: Never Smoker   . Smokeless tobacco: Never Used     Comment: never used tobacco  . Alcohol Use: No    Review of Systems  Constitutional: Positive for activity change and fatigue.  Skin: Positive for pallor.  Neurological: Positive for weakness.  All other systems reviewed and are negative.   Allergies  Vimpat; Adhesive; Depakote; Keppra; Amoxicillin-pot clavulanate; and Neulasta  Home Medications   Prior to Admission medications   Medication Sig Start Date End Date Taking? Authorizing Provider  Acetaminophen (TYLENOL PO) Place 650 mg into feeding tube every 6 (six) hours as needed (fever).    Historical Provider, MD  albuterol (PROVENTIL HFA;VENTOLIN HFA) 108 (90 BASE) MCG/ACT inhaler Inhale 2 puffs into the lungs every 4 (four) hours as needed for shortness of breath.    Historical Provider, MD  albuterol (PROVENTIL) (2.5 MG/3ML) 0.083% nebulizer solution Take 2.5 mg by nebulization. 1 ampule twice daily and as needed (making 4 doses daily) 10/11/12   Elsie Stain, MD  Ascorbic Acid (VITAMIN C) 500 MG/5ML LIQD 300 mg by PEG Tube route every morning.     Historical Provider, MD  baclofen (GABLOFEN) 40000 MCG/20ML SOLN 385.2 mcg by Intrathecal route continuous.  05/04/12   Jodi Geralds, MD  calcium carbonate, dosed in mg elemental calcium, 1250 MG/5ML 1,250 mg of elemental calcium by PEG Tube route 3 (three) times daily. 5 cc via peg tube    Historical Provider, MD  cloBAZam (ONFI) 2.5 MG/ML solution Place 2.5 mL in gastrostomy tube twice daily 05/01/14   Jodi Geralds, MD  D-VITA 400 UNIT/ML LIQD 2.5 mLs by Gastric Tube route daily. Via  G-Tube Port 04/03/14   Historical Provider, MD  DIASTAT ACUDIAL 20 MG GEL Place 12.5 mg rectally as needed. For seizure lasting 2 minutes or longer or repetitive seizures.  No more than 1 dose every 12 hours or nasal versed (not both). 04/01/14   Historical Provider, MD  dicyclomine (BENTYL) 10 MG/5ML syrup Place into feeding tube every 8 (eight) hours as needed. Not to exceed 5 doses in 1 wk    Historical Provider, MD  diphenoxylate-atropine (LOMOTIL) 2.5-0.025 MG/5ML liquid Place 10 mLs into feeding tube 4 (four) times daily as needed for diarrhea or loose stools. 2 tsp via feeding tube q6h prn loose stools    Historical Provider, MD  folic acid (FOLVITE) 1 MG  tablet 1 mg by Feeding Tube route daily.     Historical Provider, MD  furosemide (LASIX) 10 MG/ML solution Place 20 mg into feeding tube daily as needed for fluid.     Historical Provider, MD  gabapentin (NEURONTIN) 250 MG/5ML solution Take 18 mL 3 times daily 05/01/14   Jodi Geralds, MD  ibuprofen (ADVIL,MOTRIN) 100 MG/5ML suspension Place 400 mg into feeding tube every 6 (six) hours as needed for fever (pain).    Historical Provider, MD  ketoconazole (NIZORAL) 2 % cream Apply 1 application topically 2 (two) times daily as needed for irritation (yeast rash from antibiotics).     Historical Provider, MD  lansoprazole (PREVACID SOLUTAB) 30 MG disintegrating tablet 30 mg by Feeding Tube route 2 (two) times daily. 730am 09/07/12   Lafayette Dragon, MD  midazolam (VERSED) 5 MG/ML injection Place 2 mLs (10 mg total) into the nose once. Draw up 47ml in 2 syringes. Remove blue vial access device. Attach syringe to nasal atomizer for intranasal administration. Give 26ml in right nostril x 2 for seizures lasting 2 minutes or longer or for repetitive seizures in a short period of time. 11/05/13   Joelyn Oms, NP  Multiple Vitamins-Minerals (MULTIVITAMIN) LIQD 10 mLs by Feeding Tube route daily.     Historical Provider, MD  Multiple Vitamins-Minerals (ZINC  PO) 5 mLs by PEG Tube route daily. 244 mg / 5 mL zinc solution    Historical Provider, MD  mupirocin ointment (BACTROBAN) 2 % Apply 1 application topically as needed (to G-T site). 09/07/12   Lafayette Dragon, MD  Nutritional Supplements (PROMOD PO) 30 mLs by Feeding Tube route 2 (two) times daily. Noon and 10pm    Historical Provider, MD  Nutritional Supplements (TWOCAL HN) LIQD 237 mLs by Feeding Tube route. T.3 can at 46 cc hour x 12 hours - water 172 cc pre and post each and extra 172 bid.    Historical Provider, MD  OnabotulinumtoxinA (BOTOX IJ) Inject 300 Units as directed.    Historical Provider, MD  OVER THE COUNTER MEDICATION Apply 1 application topically as needed (after diaper changes). Balmex and Bag Balm paste made and applied after diaper changes    Historical Provider, MD  OXYGEN-HELIUM IN Inhale into the lungs. Oxygen PRN to keep O2 Sat at 90%    Historical Provider, MD  potassium chloride (KLOR-CON) 20 MEQ packet 20 mEq by Feeding Tube route daily.     Historical Provider, MD  Pseudoephedrine HCl (SUDAFED CHILDRENS) 15 MG/5ML LIQD Give 90 mg by tube 3 (three) times daily.     Historical Provider, MD  simethicone (MYLICON) 518 MG chewable tablet 125 mg by Feeding Tube route every 6 (six) hours as needed for flatulence.     Historical Provider, MD  sodium phosphate (FLEET) enema Place 1 enema rectally. follow package directions PRN    Historical Provider, MD  sucralfate (CARAFATE) 1 GM/10ML suspension Place 1 g into feeding tube 4 (four) times daily. 7am, 130pm, 5pm, 8pm    Historical Provider, MD  UNABLE TO FIND 2.5 mLs 2 (two) times daily. Med Name: Onif    Historical Provider, MD  Water For Irrigation, Sterile (FREE WATER) SOLN Place 172 mLs into feeding tube 4 (four) times daily.    Historical Provider, MD  zinc oxide (BALMEX) 11.3 % CREA cream Apply 1 application topically 5 (five) times daily. With each diaper change.    Historical Provider, MD   SpO2 98% Physical Exam   Constitutional: He  appears well-developed and well-nourished. No distress.  HENT:  Head: Normocephalic and atraumatic.  Eyes: Pupils are equal, round, and reactive to light.  Neck: No JVD present.  Cardiovascular: Normal rate, regular rhythm and normal heart sounds.   No murmur heard. Pulmonary/Chest: Effort normal and breath sounds normal. He has no wheezes. He has no rales. He exhibits no tenderness.  Metaport present right subclavian   Abdominal: Soft. Bowel sounds are normal. He exhibits no distension and no mass. There is no tenderness.  PEG2 present LUQ.   Musculoskeletal: Normal range of motion. He exhibits no edema.  Contractures  of both arms and both legs.   Lymphadenopathy:    He has no cervical adenopathy.  Neurological: He is alert. No cranial nerve deficit.  Alert but non verbal. Spastic quadriplegic.   Skin: Skin is warm and dry. No rash noted.  Nursing note and vitals reviewed.   ED Course  Procedures (including critical care time) DIAGNOSTIC STUDIES: Oxygen Saturation is 98% on RA, normal by my interpretation.    COORDINATION OF CARE: 12:45 AM- Pt advised of plan for treatment which includes radiology and labs and pt agrees.  Labs Review Results for orders placed or performed during the hospital encounter of 05/10/14  CBC with Differential  Result Value Ref Range   WBC 1.5 (L) 4.0 - 10.5 K/uL   RBC 2.56 (L) 4.22 - 5.81 MIL/uL   Hemoglobin 8.0 (L) 13.0 - 17.0 g/dL   HCT 24.9 (L) 39.0 - 52.0 %   MCV 97.3 78.0 - 100.0 fL   MCH 31.3 26.0 - 34.0 pg   MCHC 32.1 30.0 - 36.0 g/dL   RDW 22.9 (H) 11.5 - 15.5 %   Platelets 132 (L) 150 - 400 K/uL   Neutrophils Relative % 33 (L) 43 - 77 %   Lymphocytes Relative 42 12 - 46 %   Monocytes Relative 19 (H) 3 - 12 %   Eosinophils Relative 5 0 - 5 %   Basophils Relative 1 0 - 1 %   Neutro Abs 0.5 (L) 1.7 - 7.7 K/uL   Lymphs Abs 0.6 (L) 0.7 - 4.0 K/uL   Monocytes Absolute 0.3 0.1 - 1.0 K/uL   Eosinophils Absolute 0.1  0.0 - 0.7 K/uL   Basophils Absolute 0.0 0.0 - 0.1 K/uL   Smear Review MORPHOLOGY UNREMARKABLE   Comprehensive metabolic panel  Result Value Ref Range   Sodium 137 135 - 145 mmol/L   Potassium 4.0 3.5 - 5.1 mmol/L   Chloride 97 96 - 112 mmol/L   CO2 26 19 - 32 mmol/L   Glucose, Bld 106 (H) 70 - 99 mg/dL   BUN 10 6 - 23 mg/dL   Creatinine, Ser 0.50 0.50 - 1.35 mg/dL   Calcium 9.5 8.4 - 10.5 mg/dL   Total Protein 6.7 6.0 - 8.3 g/dL   Albumin 3.8 3.5 - 5.2 g/dL   AST 42 (H) 0 - 37 U/L   ALT 68 (H) 0 - 53 U/L   Alkaline Phosphatase 132 (H) 39 - 117 U/L   Total Bilirubin 1.3 (H) 0.3 - 1.2 mg/dL   GFR calc non Af Amer >90 >90 mL/min   GFR calc Af Amer >90 >90 mL/min   Anion gap 14 5 - 15  Type and screen  Result Value Ref Range   ABO/RH(D) O POS    Antibody Screen NEG    Sample Expiration 05/14/2014    Imaging Review Dg Chest Port 1 View  05/11/2014  CLINICAL DATA:  Lethargy. History of recurrent aspiration pneumonia.  EXAM: PORTABLE CHEST - 1 VIEW  COMPARISON:  One-view chest 04/12/2014.  FINDINGS: The heart size is normal. A right-sided Port-A-Cath is stable. The lungs are clear. Scoliosis hardware is stable.  IMPRESSION: 1. No acute cardiopulmonary disease or significant interval change.   Electronically Signed   By: San Morelle M.D.   On: 05/11/2014 01:16      MDM   Final diagnoses:  Lethargy  Pancytopenia    Known pancytopenia with apparent worsening of hemoglobin. Labs were repeated in the ED showing WBC 1.5 with ANC of 500, and hemoglobin has fallen to 8.0 from over 11. Given the difficulty in transporting the patient, he will be admitted for observation to achieve blood transfusions and to be given Neupogen. Case is discussed with Dr. Blaine Hamper of [facility] who agrees to admit the patient.  I personally performed the services described in this documentation, which was scribed in my presence. The recorded information has been reviewed and is accurate.      [name], MD [id number]/71 [id number]

## 2014-05-11 NOTE — Discharge Summary (Addendum)
PATIENT DETAILS Name: Jeffery [name] Age: 24 y.o. Sex: male Date of Birth: 04-24-1990 MRN: [id number]. Admitting Physician: Ivor Costa, MD TIW:[id number], DAVID Loletha Grayer, MD  Admit Date: 05/10/2014 Discharge date: 05/11/2014  Recommendations for Outpatient Follow-up:  1. Please repeat CBC in the next 3-5 days. 2. If neutropenia continues, may need a bone marrow biopsy.   PRIMARY DISCHARGE DIAGNOSIS:  Principal Problem:   Pancytopenia Active Problems:   Congenital spastic quadriplegia   Generalized convulsive epilepsy with intractable epilepsy   Neuromuscular scoliosis   Congested left nostril   History of pancytopenia   OSA treated with BiPAP      PAST MEDICAL HISTORY: Past Medical History  Diagnosis Date  . CP (cerebral palsy), spastic, quadriplegic   . Osteoporosis   . Undescended testes   . Seasonal allergies   . IVH (intraventricular hemorrhage) 11-Nov-1990    Grade IV  . Hip dislocation, bilateral   . Dysphagia   . Retinopathy of prematurity   . 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 Neuromuscular scoliosis   . Osteoporosis   . Complex partial seizures   . Generalized convulsive epilepsy without mention of intractable epilepsy   . Sinus bradycardia     HR drops to 38-40 while sleeping  . Blister of right heel     fluid filled; origin unknown  . Kidney stones     ?  Marland Kitchen Pneumonia      chronic pneumonia ,respitory failure dx Augest 2014  . Aspiration pneumonia     "chronic" (04/12/2014)  . OSA treated with BiPAP     "since age 18"   . Anemia   . 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 GERD (gastroesophageal reflux disease)   . Epilepsy   . Spastic quadriplegia     DISCHARGE MEDICATIONS: Current Discharge Medication List    CONTINUE these medications which have NOT CHANGED   Details  albuterol (PROVENTIL HFA;VENTOLIN HFA) 108 (90 BASE) MCG/ACT inhaler Inhale 2 puffs into the lungs every 4 (four)  hours as needed for shortness of breath.    albuterol (PROVENTIL) (2.5 MG/3ML) 0.083% nebulizer solution Take 2.5 mg by nebulization. 1 ampule twice daily and as needed (making 4 doses daily)    Ascorbic Acid (VITAMIN C) 500 MG/5ML LIQD 300 mg by PEG Tube route every morning.     baclofen (GABLOFEN) 40000 MCG/20ML SOLN 385.2 mcg by Intrathecal route continuous.     calcium carbonate, dosed in mg elemental calcium, 1250 MG/5ML 1,250 mg of elemental calcium by PEG Tube route 3 (three) times daily. 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 For seizure lasting 2 minutes or longer or repetitive seizures.  No more than 1 dose every 12 hours or nasal versed (not both). Refills: 5    dicyclomine (BENTYL) 10 MG/5ML syrup Place into feeding tube every 8 (eight) hours as needed. Not to exceed 5 doses in 1 wk    folic acid (FOLVITE) 1 MG tablet 1 mg by Feeding Tube route daily.     furosemide (LASIX) 10 MG/ML solution Place 20 mg into feeding tube daily as  needed for fluid.     gabapentin (NEURONTIN) 250 MG/5ML solution Take 18 mL 3 times daily Qty: 1620 mL, Refills: 5   Associated Diagnoses: Hip pain, unspecified laterality    ibuprofen (ADVIL,MOTRIN) 100 MG/5ML suspension Place 400 mg into feeding tube every 6 (six) hours as needed for fever (pain).    ketoconazole (NIZORAL) 2 % cream Apply 1 application topically 2 (two) times daily as needed for irritation (yeast rash from antibiotics).     lansoprazole (PREVACID SOLUTAB) 30 MG disintegrating tablet 30 mg by Feeding Tube route 2 (two) times daily. 730am    midazolam (VERSED) 5 MG/ML  injection Place 2 mLs (10 mg total) into the nose once. Draw up 42m in 2 syringes. Remove blue vial access device. Attach syringe to nasal atomizer for intranasal administration. Give 190min right nostril x 2 for seizures lasting 2 minutes or longer or for repetitive seizures in a short period of time. Qty: 6 mL, Refills: 3    Multiple Vitamins-Minerals (MULTIVITAMIN) LIQD 10 mLs by Feeding Tube route daily.     Multiple Vitamins-Minerals (ZINC PO) 5 mLs by PEG Tube route daily. 244 mg / 5 mL zinc solution    mupirocin ointment (BACTROBAN) 2 % Apply 1 application topically as needed (to G-T site). Qty: 22 g, Refills: 1    !! Nutritional Supplements (PROMOD PO) 30 mLs by Feeding Tube route 2 (two) times daily. Noon and 10pm    !! Nutritional Supplements (TWOCAL HN) LIQD 237 mLs by Feeding Tube route. T.3 can at 46 cc hour x 12 hours - water 172 cc pre and post each and extra 172 bid.    OnabotulinumtoxinA (BOTOX IJ) Inject 300 Units as directed See admin instructions. Every 3 months    OVER THE COUNTER MEDICATION Apply 1 application topically as needed (after diaper changes). Balmex and Bag Balm paste made and applied after diaper changes    potassium chloride (KLOR-CON) 20 MEQ packet 20 mEq by Feeding Tube route daily.     Pseudoephedrine HCl (SUDAFED CHILDRENS) 15 MG/5ML LIQD Give 90 mg by tube 3 (three) times daily.     simethicone (MYLICON) 12009G chewable tablet 125 mg by Feeding Tube route every 6 (six) hours as needed for flatulence.     sodium phosphate (FLEET) enema Place 1 enema rectally. follow package directions PRN    sucralfate (CARAFATE) 1 GM/10ML suspension Place 1 g into feeding tube 4 (four) times daily. 7am, 130pm, 5pm, 8pm    Water For Irrigation, Sterile (FREE WATER) SOLN Place 172 mLs into feeding tube 4 (four) times daily.    zinc oxide (BALMEX) 11.3 % CREA cream Apply 1 application topically 5 (five) times daily. With each diaper change.    Acetaminophen  (TYLENOL PO) Place 650 mg into feeding tube every 6 (six) hours as needed (fever).    OXYGEN-HELIUM IN Inhale into the lungs. Oxygen PRN to keep O2 Sat at 90%     !! - Potential duplicate medications found. Please discuss with provider.      ALLERGIES:   Allergies  Allergen Reactions  . Vimpat [Lacosamide] Rash  . Adhesive [Tape]     Rips skin off  . Depakote [Divalproex Sodium]     Causes pancreatitis   . Keppra [Levetiracetam] Other (See Comments)    Bone marrow suppression  . Amoxicillin-Pot Clavulanate Rash    Give w fluconazole then no rash  . Neulasta [Pegfilgrastim] Palpitations    BRIEF HPI:  See H&P, Labs, Consult and  Test reports for all details in brief, patient is a 24 y.o. male  with history of cerebral palsy, spastic quadriplegia, wheelchair mobile, seizure disorder, has Port-A-Cath, PEG tube, hx of pancytopenia attributed to Keppra which was discontinued, OSA on BiPAP who presented with weakness.In ED, patient was found to have worsening pancytopenia, WBC 1.5, hemoglobin dropped from 11.7 on 04/24/14 to 8.0, platelet 132. Patient was then admitted for further evaluation and treatment  CONSULTATIONS:   None  PERTINENT RADIOLOGIC STUDIES: Dg Chest Port 1 View  05/11/2014   CLINICAL DATA:  Lethargy. History of recurrent aspiration pneumonia.  EXAM: PORTABLE CHEST - 1 VIEW  COMPARISON:  One-view chest 04/12/2014.  FINDINGS: The heart size is normal. A right-sided Port-A-Cath is stable. The lungs are clear. Scoliosis hardware is stable.  IMPRESSION: 1. No acute cardiopulmonary disease or significant interval change.   Electronically Signed   By: San Morelle M.D.   On: 05/11/2014 01:16   Dg Chest Port 1 View  04/12/2014   CLINICAL DATA:  Fever and shortness of breath.  EXAM: PORTABLE CHEST - 1 VIEW  COMPARISON:  03/28/2014  FINDINGS: Tip of the right chest port remains in the SVC. Lung volumes are low. There is unchanged linear atelectasis/scar at the left lung  base. Right lung is grossly clear. Heart size is normal. No pleural effusion or pneumothorax. Extensive spinal fusion hardware in place.  IMPRESSION: Unchanged atelectasis or scarring at the left lung base. No acute process.   Electronically Signed   By: Jeb Levering M.D.   On: 04/12/2014 06:51     PERTINENT LAB RESULTS: CBC:  Recent Labs  05/11/14 0155 05/11/14 1425  WBC 1.5* 1.2*  HGB 8.0* 9.4*  HCT 24.9* 29.1*  PLT 132* 134*   CMET CMP     Component Value Date/Time   NA 140 05/11/2014 1425   NA 147* 04/24/2014 1339   K 3.8 05/11/2014 1425   K 4.1 04/24/2014 1339   CL 99 05/11/2014 1425   CL 101 04/24/2014 1339   CO2 30 05/11/2014 1425   CO2 29 04/24/2014 1339   GLUCOSE 90 05/11/2014 1425   GLUCOSE 100 04/24/2014 1339   BUN 9 05/11/2014 1425   BUN 19 04/24/2014 1339   CREATININE 0.54 05/11/2014 1425   CREATININE 0.5* 04/24/2014 1339   CALCIUM 9.3 05/11/2014 1425   CALCIUM 9.5 04/24/2014 1339   PROT 6.4 05/11/2014 1425   PROT 7.2 04/24/2014 1339   ALBUMIN 3.6 05/11/2014 1425   AST 33 05/11/2014 1425   AST 35 04/24/2014 1339   ALT 59* 05/11/2014 1425   ALT 67* 04/24/2014 1339   ALKPHOS 127* 05/11/2014 1425   ALKPHOS 157* 04/24/2014 1339   BILITOT 1.5* 05/11/2014 1425   BILITOT 1.20 04/24/2014 1339   GFRNONAA >90 05/11/2014 1425   GFRAA >90 05/11/2014 1425    GFR Estimated Creatinine Clearance: 83.7 mL/min (by C-G formula based on Cr of 0.54). No results for input(s): LIPASE, AMYLASE in the last 72 hours. No results for input(s): CKTOTAL, CKMB, CKMBINDEX, TROPONINI in the last 72 hours. Invalid input(s): POCBNP No results for input(s): DDIMER in the last 72 hours. No results for input(s): HGBA1C in the last 72 hours. No results for input(s): CHOL, HDL, LDLCALC, TRIG, CHOLHDL, LDLDIRECT in the last 72 hours. No results for input(s): TSH, T4TOTAL, T3FREE, THYROIDAB in the last 72 hours.  Invalid input(s): FREET3 No results for input(s): VITAMINB12,  FOLATE, FERRITIN, TIBC, IRON, RETICCTPCT in the last 72 hours. Coags:  Recent  Labs  05/11/14 1425  INR 1.04   Microbiology: No results found for this or any previous visit (from the past 240 hour(s)).   BRIEF HOSPITAL COURSE:   Principal Problem:   Pancytopenia: Known history of pancytopenia attributed to [place]. Followed by [name]. Patient was admitted, given 1 unit of PRBC, given Neupogen. Repeat CBC stable, patient will be discharged home in a stable manner. Remains afebrile, mother wants to take patient home today.I have asked the patient's mother to schedule an outpatient follow-up with Dr. Marin Olp over the next 2-3 days for repeat CBC.Mother will bring patient back to the Hospital in case of fever or any signs of infection  Active Problems:   Seizure disorder: Continue Onfi and as needed diazepam. Follows with pediatric neurologist-Dr. Hicklin    Congenital spastic quadriplegia: Continue baclofen pump    History of dysphagia: Continue PEG tube feedings.  TODAY-DAY OF DISCHARGE:  Subjective:   Jeffery [name] today has no headache,no chest abdominal pain,no new weakness tingling or numbness.  Objective:   Blood pressure 135/84, pulse 55, temperature 98.7 F (37.1 C), temperature source Axillary, resp. rate 24, weight 55.5 kg (122 lb 5.7 oz), SpO2 98 %.  Intake/Output Summary (Last 24 hours) at 05/11/14 1617 Last data filed at 05/11/14 1149  Gross per 24 hour  Intake    300 ml  Output      0 ml  Net    300 ml   Filed Weights   05/11/14 0518  Weight: 55.5 kg (122 lb 5.7 oz)    Exam General exam: Pleasant young male sitting up comfortably on chair. Nonverbal. Respiratory system: Clear. No increased work of breathing. Cardiovascular system: S1 & S2 heard, RRR. No JVD, murmurs, gallops, clicks or pedal edema. telemetry: ST 100's Gastrointestinal system: Abdomen is nondistended, soft and nontender. Normal bowel sounds heard. PEG tube intact. Right mid quadrant  baclofen pump intact  Central nervous system: Alert and smiling. Interacts with Mom. No focal neurological deficits. Extremities: Decreased bulk and 0 x 5 power in all limbs.  DISCHARGE CONDITION: Stable  DISPOSITION: Home  DISCHARGE INSTRUCTIONS:    Activity:  As tolerated with Full fall precautions use walker/cane & assistance as needed  Diet recommendation: NPO-peg tube feeds  Discharge Instructions    Increase activity slowly    Complete by:  As directed            Follow-up Information    Follow up with [name], DAVID C, MD. Schedule an appointment as soon as possible for a visit in 1 week.   Specialty:  Internal Medicine   Contact information:   [address] [place] [address] [phone]       Follow up with Volanda Napoleon, MD. Schedule an appointment as soon as possible for a visit in 2 days.   Specialty:  Oncology   Why:  for repeat CBC   Contact information:   [address] [place] [address] [phone]       Follow up with Jodi Geralds, MD. Schedule an appointment as soon as possible for a visit in 1 week.   Specialty:  Pediatrics   Contact information:   [address] [place] [address] [phone]         Total Time spent on discharge equals 25 minutes.  SignedOren Binet 05/11/2014 4:17 PM

## 2014-05-11 NOTE — H&P (Addendum)
[facility] History and Physical  Jeffery [name] DDU:[id number] DOB: 1990/05/13 DOA: 05/10/2014  Referring physician: ED physician PCP: Marijean Bravo, MD  Specialists:   Chief Complaint: weakness  HPI: Jeffery [name] is a 24 y.o. male  with history of cerebral palsy, spastic quadriplegia, wheelchair mobile, seizure disorder, has Port-A-Cath, PEG tube, hx of pancytopenia attributed to [place] which was discontinued, OSA on BiPAP who presents with weakness  Care giver reports that pt was fine yesterday afternoon, but started complaining of fatigue last night. He wanted to go to bed early, which is unusual for him. She reports that pt woke up fussy with a pale face. No active bleeding noticed. Patient does not have fever, chills, cough, chest pain, SOB, abdominal pain, dysuria, urgency, frequency, hematuria, skin rashes, or leg swelling. No vision change or hearing loss.  In ED, patient was found to have worsening pancytopenia, WBC 1.5, hemoglobin dropped from 11.7 on 04/24/14 to 8.0, platelet 132. Negative urinalysis, mildly abnormal liver function with AST 42, ALT 68, total bilirubin 1.3. Chest x-ray is negative for acute abnormalities. Patient is admitted to inpatient for further evaluation and treatment.  Review of Systems: As presented in the history of presenting illness, rest negative.  Where does patient live?   lives at home with foster parents,  Can patient participate in ADLs? none  Allergy:  Allergies  Allergen Reactions  . Vimpat [Lacosamide] Rash  . Adhesive [Tape]     Rips skin off  . Depakote [Divalproex Sodium]     Causes pancreatitis   . Keppra [Levetiracetam] Other (See Comments)    Bone marrow suppression  . Amoxicillin-Pot Clavulanate Rash    Give w fluconazole then no rash  . Neulasta [Pegfilgrastim] Palpitations    Past Medical History  Diagnosis Date  . CP (cerebral palsy), spastic, quadriplegic   . Osteoporosis   . Undescended testes   .  Seasonal allergies   . IVH (intraventricular hemorrhage) 09/09/1990    Grade IV  . Hip dislocation, bilateral   . Dysphagia   . Retinopathy of prematurity   . 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 Neuromuscular scoliosis   . Osteoporosis   . Complex partial seizures   . Generalized convulsive epilepsy without mention of intractable epilepsy   . Sinus bradycardia     HR drops to 38-40 while sleeping  . Blister of right heel     fluid filled; origin unknown  . Kidney stones     ?  Marland Kitchen Pneumonia      chronic pneumonia ,respitory failure dx Augest 2014  . Aspiration pneumonia     "chronic" (04/12/2014)  . OSA treated with BiPAP     "since age 74"   . Anemia   . 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 GERD (gastroesophageal reflux disease)   . Epilepsy   . Spastic quadriplegia     Past Surgical History  Procedure Laterality Date  . Mole removal  "several B/T 2008-2010"    "from all over"  . Jejunostomy feeding tube  03/08/2013; ~ 09/2013; ~ 01/2014    "transgastric-jujunal feeding tube"  . Gastrostomy tube placement  11./02/1998       . Button change  12/15/2010    Procedure: BUTTON CHANGE;  Surgeon: Gatha Mayer, MD;  Location: [facility];  Service: Endoscopy;  Laterality: N/A;  . Peg placement  10/07/2011    Procedure: PERCUTANEOUS ENDOSCOPIC GASTROSTOMY (  PEG) REPLACEMENT;  Surgeon: Lafayette Dragon, MD;  Location: [facility];  Service: Endoscopy;  Laterality: N/A;  Needs 18 F 2.5 button ordered-dl  . Inguinal hernia repair Bilateral 1992  . Retinopathy of prematurity surgery  1992  . Achilles tendon lengthening Bilateral 12/1998  . Tendon release  12/1998    Soft tissue releases  wrists and fingers [Other]  . Infusion pump implantation  07/25/2000; 2013    Intrathecal baclofen pump  . Peg placement N/A 06/05/2012    Procedure: PERCUTANEOUS ENDOSCOPIC GASTROSTOMY (PEG) REPLACEMENT;  Surgeon: Lafayette Dragon, MD;   Location: [facility];  Service: Endoscopy;  Laterality: N/A;  button 78fr.2.5cm  . Strabismus surgery Bilateral 1993    "3 on right; 2 on left"  . Back surgery  ~ 2008    Harrington Rods in back needs to be log rolled  . Tendon repair Bilateral 09/05/2012    Procedure: LENGTHENING OF DIGITAL FLEXOR TENDONS BILTERAL HANDS;  Surgeon: Jolyn Nap, MD;  Location: [place];  Service: Orthopedics;  Laterality: Bilateral;  . Peg placement N/A 09/13/2012    Procedure: PERCUTANEOUS ENDOSCOPIC GASTROSTOMY (PEG) REPLACEMENT;  Surgeon: Lafayette Dragon, MD;  Location: [facility];  Service: Endoscopy;  Laterality: N/A;  . Flexible sigmoidoscopy N/A 10/30/2012    Procedure: FLEXIBLE SIGMOIDOSCOPY;  Surgeon: Cleotis Nipper, MD;  Location: [facility];  Service: Endoscopy;  Laterality: N/A;  . Peg placement N/A 11/15/2012    Procedure: PERCUTANEOUS ENDOSCOPIC GASTROSTOMY (PEG) REPLACEMENT;  Surgeon: Jeryl Columbia, MD;  Location: [facility];  Service: Endoscopy;  Laterality: N/A;  . Gastrostomy tube placement  11./02/1998  . Rectal biopsy  10/29/2012    S/P diarrhea from Vancomycin  . Portacath placement Right 11/25/2012    chest  . Eye surgery      Social History:  reports that he has never smoked. He has never used smokeless tobacco. He reports that he does not drink alcohol or use illicit drugs.  Family History:  Family History  Problem Relation Age of Onset  . Adopted: Yes     Prior to Admission medications   Medication Sig Start Date End Date Taking? Authorizing Provider  albuterol (PROVENTIL HFA;VENTOLIN HFA) 108 (90 BASE) MCG/ACT inhaler Inhale 2 puffs into the lungs every 4 (four) hours as needed for shortness of breath.   Yes Historical Provider, MD  albuterol (PROVENTIL) (2.5 MG/3ML) 0.083% nebulizer solution Take 2.5 mg by nebulization. 1 ampule twice daily and as needed (making 4 doses daily) 10/11/12  Yes Elsie Stain, MD  Ascorbic Acid (VITAMIN C) 500 MG/5ML LIQD 300 mg by PEG Tube  route every morning.    Yes Historical Provider, MD  baclofen (GABLOFEN) 40000 MCG/20ML SOLN 385.2 mcg by Intrathecal route continuous.  05/04/12  Yes Jodi Geralds, MD  calcium carbonate, dosed in mg elemental calcium, 1250 MG/5ML 1,250 mg of elemental calcium by PEG Tube route 3 (three) times daily. 5 cc via peg tube   Yes Historical Provider, MD  cloBAZam (ONFI) 2.5 MG/ML solution Place 2.5 mL in gastrostomy tube twice daily 05/01/14  Yes Jodi Geralds, MD  DIASTAT ACUDIAL 20 MG GEL Place 12.5 mg rectally as needed. For seizure lasting 2 minutes or longer or repetitive seizures.  No more than 1 dose every 12 hours or nasal versed (not both). 04/01/14  Yes Historical Provider, MD  dicyclomine (BENTYL) 10 MG/5ML syrup Place into feeding tube every 8 (eight) hours as needed. Not to exceed 5 doses in 1 wk  Yes Historical Provider, MD  folic acid (FOLVITE) 1 MG tablet 1 mg by Feeding Tube route daily.    Yes Historical Provider, MD  furosemide (LASIX) 10 MG/ML solution Place 20 mg into feeding tube daily as needed for fluid.    Yes Historical Provider, MD  gabapentin (NEURONTIN) 250 MG/5ML solution Take 18 mL 3 times daily Patient taking differently: Place 900 mg into feeding tube 3 (three) times daily.  05/01/14  Yes Jodi Geralds, MD  ibuprofen (ADVIL,MOTRIN) 100 MG/5ML suspension Place 400 mg into feeding tube every 6 (six) hours as needed for fever (pain).   Yes Historical Provider, MD  ketoconazole (NIZORAL) 2 % cream Apply 1 application topically 2 (two) times daily as needed for irritation (yeast rash from antibiotics).    Yes Historical Provider, MD  lansoprazole (PREVACID SOLUTAB) 30 MG disintegrating tablet 30 mg by Feeding Tube route 2 (two) times daily. 730am 09/07/12  Yes Lafayette Dragon, MD  midazolam (VERSED) 5 MG/ML injection Place 2 mLs (10 mg total) into the nose once. Draw up 5ml in 2 syringes. Remove blue vial access device. Attach syringe to nasal atomizer for intranasal  administration. Give 64ml in right nostril x 2 for seizures lasting 2 minutes or longer or for repetitive seizures in a short period of time. 11/05/13  Yes Joelyn Oms, NP  Multiple Vitamins-Minerals (MULTIVITAMIN) LIQD 10 mLs by Feeding Tube route daily.    Yes Historical Provider, MD  Multiple Vitamins-Minerals (ZINC PO) 5 mLs by PEG Tube route daily. 244 mg / 5 mL zinc solution   Yes Historical Provider, MD  mupirocin ointment (BACTROBAN) 2 % Apply 1 application topically as needed (to G-T site). 09/07/12  Yes Lafayette Dragon, MD  Nutritional Supplements (PROMOD PO) 30 mLs by Feeding Tube route 2 (two) times daily. Noon and 10pm   Yes Historical Provider, MD  Nutritional Supplements (TWOCAL HN) LIQD 237 mLs by Feeding Tube route. T.3 can at 46 cc hour x 12 hours - water 172 cc pre and post each and extra 172 bid.   Yes Historical Provider, MD  OnabotulinumtoxinA (BOTOX IJ) Inject 300 Units as directed See admin instructions. Every 3 months   Yes Historical Provider, MD  OVER THE COUNTER MEDICATION Apply 1 application topically as needed (after diaper changes). Balmex and Bag Balm paste made and applied after diaper changes   Yes Historical Provider, MD  potassium chloride (KLOR-CON) 20 MEQ packet 20 mEq by Feeding Tube route daily.    Yes Historical Provider, MD  Pseudoephedrine HCl (SUDAFED CHILDRENS) 15 MG/5ML LIQD Give 90 mg by tube 3 (three) times daily.    Yes Historical Provider, MD  simethicone (MYLICON) 856 MG chewable tablet 125 mg by Feeding Tube route every 6 (six) hours as needed for flatulence.    Yes Historical Provider, MD  sodium phosphate (FLEET) enema Place 1 enema rectally. follow package directions PRN   Yes Historical Provider, MD  sucralfate (CARAFATE) 1 GM/10ML suspension Place 1 g into feeding tube 4 (four) times daily. 7am, 130pm, 5pm, 8pm   Yes Historical Provider, MD  Water For Irrigation, Sterile (FREE WATER) SOLN Place 172 mLs into feeding tube 4 (four) times daily.    Yes Historical Provider, MD  zinc oxide (BALMEX) 11.3 % CREA cream Apply 1 application topically 5 (five) times daily. With each diaper change.   Yes Historical Provider, MD  Acetaminophen (TYLENOL PO) Place 650 mg into feeding tube every 6 (six) hours as needed (fever).  Historical Provider, MD  OXYGEN-HELIUM IN Inhale into the lungs. Oxygen PRN to keep O2 Sat at 90%    Historical Provider, MD    Physical Exam: Filed Vitals:   05/11/14 0300 05/11/14 0330 05/11/14 0400 05/11/14 0430  BP: 104/57 120/73 128/83 122/69  Pulse: 67 71 80 96  Temp:      TempSrc:      Resp: 12 13 15 17   SpO2: 98% 99% 98% 97%   General exam: not in acute distress Head, eyes and ENT: Nontraumatic and normocephalic. Pupils equally reacting to light and accommodation. Oral mucosa dry. Patient not fully cooperative with oral cavity and eye exam. Neck: Supple. No JVD, carotid bruit or thyromegaly. Lymphatics: No lymphadenopathy. Respiratory system: mild rhonchi bilaterally, No increased work of breathing. Porta cath right upper anterior chest: Site looks clean and dry without evidence of infection. Cardiovascular system: S1 and S2 heard, regular tachycardic. No JVD, murmurs, gallops, clicks or pedal edema. Telemetry: Sinus tachycardia in the 120s. Gastrointestinal system: Abdomen is nondistended, soft and nontender. Normal bowel sounds heard. No organomegaly or masses appreciated. PEG tube site intact without evidence of infection. Central nervous system: Alert, looks around, nonverbal and does not follow instructions/not oriented. No obvious cranial nerve deficits. Extremities: All extremities with decreased bulk, spastic with contractures of hands and feet. Surgical scars on both forearms and legs from contracture release surgery. Peripheral pulses symmetrically felt.   Skin: No rashes or acute findings. Musculoskeletal system: Negative exam. Psychiatry: Pleasant and cooperative.   Labs on Admission:  Basic  Metabolic Panel:  Recent Labs Lab 05/11/14 0155  NA 137  K 4.0  CL 97  CO2 26  GLUCOSE 106*  BUN 10  CREATININE 0.50  CALCIUM 9.5   Liver Function Tests:  Recent Labs Lab 05/11/14 0155  AST 42*  ALT 68*  ALKPHOS 132*  BILITOT 1.3*  PROT 6.7  ALBUMIN 3.8   No results for input(s): LIPASE, AMYLASE in the last 168 hours. No results for input(s): AMMONIA in the last 168 hours. CBC:  Recent Labs Lab 05/11/14 0155  WBC 1.5*  NEUTROABS 0.5*  HGB 8.0*  HCT 24.9*  MCV 97.3  PLT 132*   Cardiac Enzymes: No results for input(s): CKTOTAL, CKMB, CKMBINDEX, TROPONINI in the last 168 hours.  BNP (last 3 results) No results for input(s): BNP in the last 8760 hours.  ProBNP (last 3 results) No results for input(s): PROBNP in the last 8760 hours.  CBG: No results for input(s): GLUCAP in the last 168 hours.  Radiological Exams on Admission: Dg Chest Port 1 View  05/11/2014   CLINICAL DATA:  Lethargy. History of recurrent aspiration pneumonia.  EXAM: PORTABLE CHEST - 1 VIEW  COMPARISON:  One-view chest 04/12/2014.  FINDINGS: The heart size is normal. A right-sided Port-A-Cath is stable. The lungs are clear. Scoliosis hardware is stable.  IMPRESSION: 1. No acute cardiopulmonary disease or significant interval change.   Electronically Signed   By: San Morelle M.D.   On: 05/11/2014 01:16    Assessment/Plan Principal Problem:   Pancytopenia Active Problems:   Congenital spastic quadriplegia   Generalized convulsive epilepsy with intractable epilepsy   Neuromuscular scoliosis   OSA on CPAP   Congested left nostril   History of pancytopenia  Pancytopenia: This was noticed in early February. Hematology, Dr. Marin Olp following and felt that this was related to [place] which was discontinued. Peripheral smear apparently unremarkable. He received neupogen in the past. No signs of infection now. Though his hgb  is above 7.0, he is very weak. Also given his difficulty of  transportation, I will transfuse him with blood.  i-will admit to tele bed -give one dose of neupogen -transfuse 1 Unit of blood.  Sizure disorder: Patient is allergic to several common seizure medications. Currently he is on Onfi and prn Diastat Acudial -will continue home medications  OSA:  -Continue nightly BIPAP  History of cerebral palsy/congenital spastic quadriplegia/baclofen pump: -continue Baclofen pump  Dysphagia,s/p PEG tube:  -Nutritionist consulted for management.   DVT ppx: SQ Heparin      Code Status: Partial Family Communication: Yes, patient's  Care giver     at bed side Disposition Plan: Admit to inpatient   Date of Service 05/11/2014    Ivor Costa [facility] Pager [phone]  If 7PM-7AM, please contact night-coverage [email or web address] Password [facility] 05/11/2014, 4:41 AM

## 2014-05-11 NOTE — ED Notes (Signed)
Port accessed with 20 g

## 2014-05-11 NOTE — Progress Notes (Signed)
Patient discharge teaching given to mother, including activity, diet, follow-up appoints, and medications. Patient's mother verbalized understanding of all discharge instructions. Port to be deaccessed by IV team. Vitals are stable. Skin is intact. Pt to be escorted out by NT, to be driven home by family.

## 2014-05-11 NOTE — ED Notes (Signed)
Mom states the only way to obtain urine is through a in/out cath, mother does not want do a cath unless it is completely necessary because she states his white count is low. Nurse was notified.

## 2014-05-11 NOTE — Progress Notes (Signed)
INITIAL NUTRITION ASSESSMENT  DOCUMENTATION CODES Per approved criteria  -Not Applicable   INTERVENTION: Initiate Pts TF regimen Two Cal HN as at home per J-port at 46 ml/hr for 12 hours w/ 220 ml flush x3  Provide Prostat 30 ml BID  Provides 1304 kcal, 76 grams of protein, and 1046 ml of H20  NUTRITION DIAGNOSIS: Inadequate oral intake related to inability to eat as evidenced by being fed through j port.   Goal: Pt to meet >/= 90% of their estimated nutrition needs   Monitor:  TF to goal, TF tolerance, labs  Reason for Assessment: Consult to order Home TF regimen  24 y.o. male  Admitting Dx: Pancytopenia  ASSESSMENT: 24 y.o. male  with history of cerebral palsy, spastic quadriplegia, wheelchair mobile, seizure disorder, has Port-A-Cath, PEG tube, hx of pancytopenia attributed to [place] which was discontinued, OSA on BiPAP who presents with weakness  Height: Ht Readings from Last 1 Encounters:  04/24/14 4\' 4"  (1.321 m)  Per family height is 4' 11.5  Weight: Wt Readings from Last 1 Encounters:  05/11/14 122 lb 5.7 oz (55.5 kg)    Ideal Body Weight: 95 lbs (adjusted for quadriplegia)  % Ideal Body Weight: 128%  Wt Readings from Last 10 Encounters:  05/11/14 122 lb 5.7 oz (55.5 kg)  04/13/14 127 lb (57.607 kg)  04/03/14 129 lb (58.514 kg)  03/02/14 134 lb 0.6 oz (60.8 kg)  12/12/13 128 lb (58.06 kg)  04/17/13 124 lb (56.246 kg)  12/25/12 116 lb (52.617 kg)  11/29/12 116 lb 2.9 oz (52.7 kg)  11/07/12 116 lb (52.617 kg)  10/30/12 116 lb (52.617 kg)    Usual Body Weight: 124 lbs  % Usual Body Weight: 98%  BMI:  Body mass index is 31.8 kg/(m^2).  BMI ~25 with 4' 11.5" ht per family   Estimated Nutritional Needs: Kcal: 1150-1350 (21-25 kcal/kg) Protein: 75-86 (1.35-1.55) Fluid: >1.2 liters   Skin: WDL  Diet Order: Diet regular Room service appropriate?: Yes; Fluid consistency:: Thin  EDUCATION NEEDS: -No education needs identified at this  time   Intake/Output Summary (Last 24 hours) at 05/11/14 1158 Last data filed at 05/11/14 1149  Gross per 24 hour  Intake    300 ml  Output      0 ml  Net    300 ml    Last BM: 4/8  Labs:   Recent Labs Lab 05/11/14 0155  NA 137  K 4.0  CL 97  CO2 26  BUN 10  CREATININE 0.50  CALCIUM 9.5  GLUCOSE 106*    CBG (last 3)   Recent Labs  05/11/14 0842  GLUCAP 112*    Scheduled Meds: . sodium chloride   Intravenous Once  . ascorbic acid  300 mg Per Tube q morning - 10a  . calcium carbonate (dosed in mg elemental calcium)  1,250 mg of elemental calcium Per Tube TID  . cloBAZam  6.25 mg Per Tube BID  . dicyclomine  10 mg Per Tube TID AC  . feeding supplement (ENSURE ENLIVE)  237 mL Per Tube 3 times per day  . feeding supplement (PRO-STAT SUGAR FREE 64)  30 mL Per Tube 2 times per day  . filgrastim (NEUPOGEN)  SQ  300 mcg Subcutaneous ONCE-1800  . folic acid  1 mg Per Tube Daily  . free water  172 mL Per Tube QID  . gabapentin  900 mg Per Tube TID  . heparin  5,000 Units Subcutaneous 3 times per day  .  multivitamin  10 mL Per Tube Daily  . pantoprazole sodium  40 mg Per Tube BID  . potassium chloride  20 mEq Per Tube Daily  . Pseudoephedrine HCl  90 mg Per Tube TID  . sodium chloride  3 mL Intravenous Q12H  . sucralfate  1 g Per Tube 4 times per day  . zinc oxide  1 application Topical 5 X Daily    Continuous Infusions: . baclofen      Past Medical History  Diagnosis Date  . CP (cerebral palsy), spastic, quadriplegic   . Osteoporosis   . Undescended testes   . Seasonal allergies   . IVH (intraventricular hemorrhage) 11/05/1990    Grade IV  . Hip dislocation, bilateral   . Dysphagia   . Retinopathy of prematurity   . 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 Neuromuscular scoliosis   . Osteoporosis   . Complex partial seizures   . Generalized convulsive epilepsy without mention of intractable epilepsy   . Sinus bradycardia     HR drops to 38-40 while  sleeping  . Blister of right heel     fluid filled; origin unknown  . Kidney stones     ?  Marland Kitchen Pneumonia      chronic pneumonia ,respitory failure dx Augest 2014  . Aspiration pneumonia     "chronic" (04/12/2014)  . OSA treated with BiPAP     "since age 24"   . Anemia   . 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 GERD (gastroesophageal reflux disease)   . Epilepsy   . Spastic quadriplegia     Past Surgical History  Procedure Laterality Date  . Mole removal  "several B/T 2008-2010"    "from all over"  . Jejunostomy feeding tube  03/08/2013; ~ 09/2013; ~ 01/2014    "transgastric-jujunal feeding tube"  . Gastrostomy tube placement  11./02/1998       . Button change  12/15/2010    Procedure: BUTTON CHANGE;  Surgeon: Gatha Mayer, MD;  Location: [facility];  Service: Endoscopy;  Laterality: N/A;  . Peg placement  10/07/2011    Procedure: PERCUTANEOUS ENDOSCOPIC GASTROSTOMY (PEG) REPLACEMENT;  Surgeon: Lafayette Dragon, MD;  Location: [facility];  Service: Endoscopy;  Laterality: N/A;  Needs 18 F 2.5 button ordered-dl  . Inguinal hernia repair Bilateral 1992  . Retinopathy of prematurity surgery  1992  . Achilles tendon lengthening Bilateral 12/1998  . Tendon release  12/1998    Soft tissue releases  wrists and fingers [Other]  . Infusion pump implantation  07/25/2000; 2013    Intrathecal baclofen pump  . Peg placement N/A 06/05/2012    Procedure: PERCUTANEOUS ENDOSCOPIC GASTROSTOMY (PEG) REPLACEMENT;  Surgeon: Lafayette Dragon, MD;  Location: [facility];  Service: Endoscopy;  Laterality: N/A;  button 14fr.2.5cm  . Strabismus surgery Bilateral 1993    "3 on right; 2 on left"  . Back surgery  ~ 2008    Harrington Rods in back needs to be log rolled  . Tendon repair Bilateral 09/05/2012    Procedure: LENGTHENING OF DIGITAL FLEXOR TENDONS BILTERAL HANDS;  Surgeon: Jolyn Nap, MD;  Location: [place];  Service: Orthopedics;   Laterality: Bilateral;  . Peg placement N/A 09/13/2012    Procedure: PERCUTANEOUS ENDOSCOPIC GASTROSTOMY (PEG) REPLACEMENT;  Surgeon: Lafayette Dragon, MD;  Location: [facility];  Service: Endoscopy;  Laterality: N/A;  . Flexible sigmoidoscopy N/A 10/30/2012  Procedure: FLEXIBLE SIGMOIDOSCOPY;  Surgeon: Cleotis Nipper, MD;  Location: [facility];  Service: Endoscopy;  Laterality: N/A;  . Peg placement N/A 11/15/2012    Procedure: PERCUTANEOUS ENDOSCOPIC GASTROSTOMY (PEG) REPLACEMENT;  Surgeon: Jeryl Columbia, MD;  Location: [facility];  Service: Endoscopy;  Laterality: N/A;  . Gastrostomy tube placement  11./02/1998  . Rectal biopsy  10/29/2012    S/P diarrhea from Vancomycin  . Portacath placement Right 11/25/2012    chest  . Eye surgery      Burtis Junes RD, LDN Nutrition Pager: [phone] 05/11/2014 11:58 AM

## 2014-05-11 NOTE — ED Notes (Signed)
Hospital bed with air mattress requested  for 5W room 36 @0459 .

## 2014-05-11 NOTE — Progress Notes (Signed)
Patient received to room 36 from ED, transferred from stretcher to bed. SR up, call bell in reach. Mom at bedside, oriented to room, call bell, bed controls and all orders. Patient unresponsive verbally, does open eyes. Admission paperwork/assessment complete. Mom refusing air mattress bed. Dr Blaine Hamper contacted regarding Mom states "patient has to be on continuous pulse at all times and needs neb treatments and voiced Mom's concerns that patient usually on 3W or 3S" Per Dr Blaine Hamper orders for nebs, continuous pulse ox but states patient is stable and therefore no need for step down bed. Mom informed of above.

## 2014-05-11 NOTE — Progress Notes (Signed)
Called to get report on patient, nurse off floor. Will try back.

## 2014-05-11 NOTE — ED Notes (Signed)
Per mother of patient who arrived with ems, pt appears weaker, more tired, and more pale than usual, pt w/low white count and anemia per labs.  Baseline is non-verbal w/cp

## 2014-05-11 NOTE — Progress Notes (Signed)
Pt's mom is concerned that patient across hallway is on contact precautions and her son's WBC's is low. Assured mom that appropriate measures would be taken to control infection. Notified Glenard Haring, RN Estate agent) of situation. Will continue to monitor pt. Ranelle Oyster, RN

## 2014-05-12 LAB — TYPE AND SCREEN
ABO/RH(D): O POS
Antibody Screen: NEGATIVE
UNIT DIVISION: 0

## 2014-05-13 ENCOUNTER — Encounter: Payer: Self-pay | Admitting: Hematology & Oncology

## 2014-05-13 ENCOUNTER — Telehealth: Payer: Self-pay | Admitting: *Deleted

## 2014-05-13 ENCOUNTER — Telehealth: Payer: Self-pay | Admitting: Family

## 2014-05-13 NOTE — Telephone Encounter (Signed)
It appears that this was more a malfunction of our phone answering system, and also a problem with the emergency room not following through on her concerns.  I spoke Jenny Reichmann about this in detail.

## 2014-05-13 NOTE — Addendum Note (Signed)
Addended by: Marcial Pacas on: 05/13/2014 02:10 PM   Modules accepted: Level of Service

## 2014-05-13 NOTE — Telephone Encounter (Signed)
[name], left message about [name]. She said that on Friday his hemoglobin was very low, and was admitted to ER overnight for transfusion and Neupogen. Mom said that after he came home, he was still slow to arouse and she was unsure of whether or not to give Onfi dose Sat night with him being slow to arouse. She was afraid that it would sedate him further. She tried to call [facility] on call dr Sat night to ask about whether or not to give the [facility] and could not get through to the after hours nurse service. She ended up giving the dose late, after [name] perked up some. Mom was frustrated that she could not reach the after hours nurse line and tried calling [facility], who told her that they couldn't help her to reach this office. I told her that I would notify our manager that she had difficulty with phone line, and that I would let Dr Gaynell Face know about the events. She said that [name] was more alert and like himself yesterday. Mom can be reached at [phone]. TG

## 2014-05-13 NOTE — Telephone Encounter (Signed)
Patient seen in the ED over the weekend for anemia and neutropenia. Mother would like labs repeated today. Spoke to [place] at [facility] and order given for CBC today.

## 2014-05-14 ENCOUNTER — Telehealth: Payer: Self-pay | Admitting: Family

## 2014-05-14 ENCOUNTER — Ambulatory Visit (HOSPITAL_BASED_OUTPATIENT_CLINIC_OR_DEPARTMENT_OTHER): Payer: Medicaid Other

## 2014-05-14 ENCOUNTER — Other Ambulatory Visit: Payer: Self-pay | Admitting: *Deleted

## 2014-05-14 ENCOUNTER — Telehealth: Payer: Self-pay | Admitting: *Deleted

## 2014-05-14 DIAGNOSIS — D72819 Decreased white blood cell count, unspecified: Secondary | ICD-10-CM | POA: Diagnosis not present

## 2014-05-14 DIAGNOSIS — D61818 Other pancytopenia: Secondary | ICD-10-CM | POA: Diagnosis present

## 2014-05-14 DIAGNOSIS — G808 Other cerebral palsy: Secondary | ICD-10-CM

## 2014-05-14 LAB — PATHOLOGIST SMEAR REVIEW

## 2014-05-14 MED ORDER — FILGRASTIM 300 MCG/0.5ML IJ SOSY
300.0000 ug | PREFILLED_SYRINGE | Freq: Once | INTRAMUSCULAR | Status: AC
  Administered 2014-05-14: 300 ug via SUBCUTANEOUS
  Filled 2014-05-14: qty 0.5

## 2014-05-14 NOTE — Telephone Encounter (Signed)
Patient's mother wants to patient to come in for a neupogen shot today. Called [facility] to obtain labs drawn yesterday. Showed results to [name] and he does want patient to receive a dose of neupogen today. Notified patient's mother. Appointment made for today.

## 2014-05-14 NOTE — Patient Instructions (Signed)
Filgrastim, G-CSF injection  What is this medicine?  FILGRASTIM, G-CSF (fil GRA stim) is a granulocyte colony-stimulating factor that stimulates the growth of neutrophils, a type of white blood cell (WBC) important in the body's fight against infection. It is used to reduce the incidence of fever and infection in patients with certain types of cancer who are receiving chemotherapy that affects the bone marrow, to stimulate blood cell production for removal of WBCs from the body prior to a bone marrow transplantation, to reduce the incidence of fever and infection in patients who have severe chronic neutropenia, and to improve survival outcomes following high-dose radiation exposure that is toxic to the bone marrow.  This medicine may be used for other purposes; ask your health care provider or pharmacist if you have questions.  COMMON BRAND NAME(S): Neupogen  What should I tell my health care provider before I take this medicine?  They need to know if you have any of these conditions:  -latex allergy  -ongoing radiation therapy  -sickle cell disease  -an unusual or allergic reaction to filgrastim, pegfilgrastim, other medicines, foods, dyes, or preservatives  -pregnant or trying to get pregnant  -breast-feeding  How should I use this medicine?  This medicine is for injection under the skin. If you get this medicine at home, you will be taught how to prepare and give this medicine. Refer to the Instructions for Use that come with your medication packaging. Use exactly as directed. Take your medicine at regular intervals. Do not take your medicine more often than directed.  It is important that you put your used needles and syringes in a special sharps container. Do not put them in a trash can. If you do not have a sharps container, call your pharmacist or healthcare provider to get one.  Talk to your pediatrician regarding the use of this medicine in children. While this drug may be prescribed for children as young  as 7 months for selected conditions, precautions do apply.  Overdosage: If you think you have taken too much of this medicine contact a poison control center or emergency room at once.  NOTE: This medicine is only for you. Do not share this medicine with others.  What if I miss a dose?  It is important not to miss your dose. Call your doctor or health care professional if you miss a dose.  What may interact with this medicine?  This medicine may interact with the following medications:  -medicines that may cause a release of neutrophils, such as lithium  This list may not describe all possible interactions. Give your health care provider a list of all the medicines, herbs, non-prescription drugs, or dietary supplements you use. Also tell them if you smoke, drink alcohol, or use illegal drugs. Some items may interact with your medicine.  What should I watch for while using this medicine?  You may need blood work done while you are taking this medicine.  What side effects may I notice from receiving this medicine?  Side effects that you should report to your doctor or health care professional as soon as possible:  -allergic reactions like skin rash, itching or hives, swelling of the face, lips, or tongue  -dizziness or feeling faint  -fever  -pain, redness, or irritation at site where injected  -pinpoint red spots on the skin  -shortness of breath or breathing problems  -stomach or side pain, or pain at the shoulder  -swelling  -tiredness  -trouble passing urine  -unusual   bleeding or bruising  Side effects that usually do not require medical attention (report to your doctor or health care professional if they continue or are bothersome):  -bone pain  -headache  -muscle pain  This list may not describe all possible side effects. Call your doctor for medical advice about side effects. You may report side effects to FDA at 1-800-FDA-1088.  Where should I keep my medicine?  Keep out of the reach of children.  Store in a  refrigerator between 2 and 8 degrees C (36 and 46 degrees F). Do not freeze. Keep in carton to protect from light. Throw away this medicine if vials or syringes are left out of the refrigerator for more than 24 hours. Throw away any unused medicine after the expiration date.  NOTE: This sheet is a summary. It may not cover all possible information. If you have questions about this medicine, talk to your doctor, pharmacist, or health care provider.   2015, [employer]. (2013-05-04 17:00:01)

## 2014-05-14 NOTE — Telephone Encounter (Signed)
I reviewed your note and agree with this plan, thank you. 

## 2014-05-14 NOTE — Telephone Encounter (Signed)
[name] left a message saying that when Jeffery [name] was discharged from hospital on the weeknd, that she was told to call this office for appointment this week. She doesn't think he needs to be seen here in follow up since his hospitalization was for low hemoglobin and transfusion, but will bring him if needed. I called Mom back and let her know that Jeffery [name] does not need to be seen here this week, that we will see him in May as previously planned. I assured her that if [name]'s needs change, we will be happy to see him sooner. Mom was happy with this plan. TG

## 2014-05-15 ENCOUNTER — Encounter: Payer: Self-pay | Admitting: Hematology & Oncology

## 2014-05-17 ENCOUNTER — Telehealth: Payer: Self-pay | Admitting: Family

## 2014-05-17 DIAGNOSIS — G40319 Generalized idiopathic epilepsy and epileptic syndromes, intractable, without status epilepticus: Secondary | ICD-10-CM

## 2014-05-17 DIAGNOSIS — G40219 Localization-related (focal) (partial) symptomatic epilepsy and epileptic syndromes with complex partial seizures, intractable, without status epilepticus: Secondary | ICD-10-CM

## 2014-05-17 MED ORDER — CLOBAZAM 2.5 MG/ML PO SUSP: ORAL | Status: DC

## 2014-05-17 NOTE — Telephone Encounter (Signed)
[name] left message about Jeffery [name], saying that he has had 6 seizures since last Weds. Mom said that one happened in hospital on the weekend, then he has also had 2 partial seizures lasting about 30 seconds each and 3 grand mal seizures lasting about a minute each. Mom said that the seizures usually happens in the afternoon, around 130 or so. Mom asks, do you want her to do anything different with his medication? Mom can be reached at [phone]. TG

## 2014-05-17 NOTE — Telephone Encounter (Signed)
Mom says that the episodes are happening in the early afternoon or sometimes round dinnertime.  We will give a third dose of clobazam and see how that works.

## 2014-05-22 ENCOUNTER — Encounter: Payer: Self-pay | Admitting: Hematology & Oncology

## 2014-05-22 ENCOUNTER — Ambulatory Visit (HOSPITAL_BASED_OUTPATIENT_CLINIC_OR_DEPARTMENT_OTHER): Payer: Medicaid Other

## 2014-05-22 ENCOUNTER — Other Ambulatory Visit (HOSPITAL_BASED_OUTPATIENT_CLINIC_OR_DEPARTMENT_OTHER): Payer: Medicaid Other

## 2014-05-22 ENCOUNTER — Ambulatory Visit (HOSPITAL_BASED_OUTPATIENT_CLINIC_OR_DEPARTMENT_OTHER): Payer: Medicaid Other | Admitting: Hematology & Oncology

## 2014-05-22 VITALS — BP 118/70 | HR 121 | Temp 97.8°F | Resp 22

## 2014-05-22 DIAGNOSIS — D61818 Other pancytopenia: Secondary | ICD-10-CM

## 2014-05-22 DIAGNOSIS — D649 Anemia, unspecified: Secondary | ICD-10-CM

## 2014-05-22 DIAGNOSIS — R5081 Fever presenting with conditions classified elsewhere: Secondary | ICD-10-CM

## 2014-05-22 DIAGNOSIS — D709 Neutropenia, unspecified: Secondary | ICD-10-CM

## 2014-05-22 DIAGNOSIS — Z5189 Encounter for other specified aftercare: Secondary | ICD-10-CM

## 2014-05-22 DIAGNOSIS — Z95828 Presence of other vascular implants and grafts: Secondary | ICD-10-CM

## 2014-05-22 LAB — CMP (CANCER CENTER ONLY)
ALBUMIN: 3.6 g/dL (ref 3.3–5.5)
ALT: 50 U/L — AB (ref 10–47)
AST: 31 U/L (ref 11–38)
Alkaline Phosphatase: 146 U/L — ABNORMAL HIGH (ref 26–84)
BUN, Bld: 21 mg/dL (ref 7–22)
CO2: 33 mEq/L (ref 18–33)
Calcium: 9.5 mg/dL (ref 8.0–10.3)
Chloride: 97 mEq/L — ABNORMAL LOW (ref 98–108)
Creat: 0.6 mg/dl (ref 0.6–1.2)
Glucose, Bld: 94 mg/dL (ref 73–118)
Potassium: 3.6 mEq/L (ref 3.3–4.7)
Sodium: 144 mEq/L (ref 128–145)
TOTAL PROTEIN: 7.3 g/dL (ref 6.4–8.1)
Total Bilirubin: 2.1 mg/dl — ABNORMAL HIGH (ref 0.20–1.60)

## 2014-05-22 LAB — CBC WITH DIFFERENTIAL (CANCER CENTER ONLY)
BASO#: 0 10*3/uL (ref 0.0–0.2)
BASO%: 0.5 % (ref 0.0–2.0)
EOS%: 3.6 % (ref 0.0–7.0)
Eosinophils Absolute: 0.1 10*3/uL (ref 0.0–0.5)
HCT: 28.8 % — ABNORMAL LOW (ref 38.7–49.9)
HGB: 9.2 g/dL — ABNORMAL LOW (ref 13.0–17.1)
LYMPH#: 0.5 10*3/uL — AB (ref 0.9–3.3)
LYMPH%: 25.4 % (ref 14.0–48.0)
MCH: 31.9 pg (ref 28.0–33.4)
MCHC: 31.9 g/dL — AB (ref 32.0–35.9)
MCV: 100 fL — ABNORMAL HIGH (ref 82–98)
MONO#: 0.4 10*3/uL (ref 0.1–0.9)
MONO%: 19.8 % — ABNORMAL HIGH (ref 0.0–13.0)
NEUT%: 50.7 % (ref 40.0–80.0)
NEUTROS ABS: 1 10*3/uL — AB (ref 1.5–6.5)
Platelets: 119 10*3/uL — ABNORMAL LOW (ref 145–400)
RBC: 2.88 10*6/uL — ABNORMAL LOW (ref 4.20–5.70)
RDW: 22.4 % — ABNORMAL HIGH (ref 11.1–15.7)
WBC: 2 10*3/uL — ABNORMAL LOW (ref 4.0–10.0)

## 2014-05-22 LAB — CHCC SATELLITE - SMEAR

## 2014-05-22 MED ORDER — SODIUM CHLORIDE 0.9 % IJ SOLN
10.0000 mL | INTRAMUSCULAR | Status: DC | PRN
  Administered 2014-05-22: 10 mL via INTRAVENOUS
  Filled 2014-05-22: qty 10

## 2014-05-22 MED ORDER — FILGRASTIM 480 MCG/0.8ML IJ SOSY
480.0000 ug | PREFILLED_SYRINGE | Freq: Once | INTRAMUSCULAR | Status: AC
  Administered 2014-05-22: 480 ug via SUBCUTANEOUS
  Filled 2014-05-22: qty 0.8

## 2014-05-22 MED ORDER — HEPARIN SOD (PORK) LOCK FLUSH 100 UNIT/ML IV SOLN
500.0000 [IU] | Freq: Once | INTRAVENOUS | Status: AC
  Administered 2014-05-22: 500 [IU] via INTRAVENOUS
  Filled 2014-05-22: qty 5

## 2014-05-22 NOTE — Patient Instructions (Signed)

## 2014-05-22 NOTE — Progress Notes (Signed)
Hematology and Oncology Follow Up Visit  Jeffery [name] [id number] [birth year] 23 y.o. 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 is back for follow-up. He seems to be doing okay. He was in the emergency room recently. He was not admitted. He was quite anemic. He was given 1 unit of blood. He's had no obvious bleeding. I does think that given all of his health issues and medications, that this is probably is anemia of chronic disease.  We check his erythropoietin level. Is 1100 so he would not be a candidate for Aranesp or Procrit.  There is no issue with rashes.   He is doing his tube feeds fairly well.  He's not had any obvious seizure activity. His Keppra has been discontinued.  He's had no obvious shortness of breath. He's had no cough. There's been no pain.  This probably is the best that I have seen him look since he has been out of the hospital.  He does have a baclofen pump which is helping with his spasms.  Medications:  Current outpatient prescriptions:  .  Acetaminophen (TYLENOL PO), Place 650 mg into feeding tube every 6 (six) hours as needed (fever)., Disp: , Rfl:  .  albuterol (PROVENTIL HFA;VENTOLIN HFA) 108 (90 BASE) MCG/ACT inhaler, Inhale 2 puffs into the lungs every 4 (four) hours as needed for shortness of breath., Disp: , Rfl:  .  albuterol (PROVENTIL) (2.5 MG/3ML) 0.083% nebulizer solution, Take 2.5 mg by nebulization. 1 ampule twice daily and as needed (making 4 doses daily), Disp: , Rfl:  .  Ascorbic Acid (VITAMIN C) 500 MG/5ML LIQD, 300 mg by PEG Tube route every morning. , Disp: , Rfl:  .  baclofen (GABLOFEN) 40000 MCG/20ML SOLN, 385.2 mcg by Intrathecal route continuous. , Disp: , Rfl:  .  calcium carbonate, dosed in mg elemental calcium, 1250 MG/5ML, 1,250 mg of elemental calcium by PEG Tube route 3  (three) times daily. 5 cc via peg tube, Disp: , Rfl:  .  cloBAZam (ONFI) 2.5 MG/ML solution, Place 2.5 mL in gastrostomy tube 3 times daily, Disp: 240 mL, Rfl: 5 .  DIASTAT ACUDIAL 20 MG GEL, Place 12.5 mg rectally as needed. For seizure lasting 2 minutes or longer or repetitive seizures.  No more than 1 dose every 12 hours or nasal versed (not both)., Disp: , Rfl: 5 .  dicyclomine (BENTYL) 10 MG/5ML syrup, Place into feeding tube every 8 (eight) hours as needed. Not to exceed 5 doses in 1 wk, Disp: , Rfl:  .  folic acid (FOLVITE) 1 MG tablet, 1 mg by Feeding Tube route daily. , Disp: , Rfl:  .  furosemide (LASIX) 10 MG/ML solution, Place 20 mg into feeding tube daily as needed for fluid. , Disp: , Rfl:  .  gabapentin (NEURONTIN) 250 MG/5ML solution, Take 18 mL 3 times daily (Patient taking differently: Place 900 mg into feeding tube 3 (three) times daily. ), Disp: 1620 mL, Rfl: 5 .  ibuprofen (ADVIL,MOTRIN) 100 MG/5ML suspension, Place 400 mg into feeding tube every 6 (six) hours as needed for fever (pain)., Disp: , Rfl:  .  ketoconazole (NIZORAL) 2 % cream, Apply 1 application topically 2 (two) times daily as needed for irritation (yeast rash from antibiotics). , Disp: , Rfl:  .  lansoprazole (PREVACID SOLUTAB) 30 MG disintegrating tablet, 30 mg  by Feeding Tube route 2 (two) times daily. 730am, Disp: , Rfl:  .  midazolam (VERSED) 5 MG/ML injection, Place 2 mLs (10 mg total) into the nose once. Draw up 67m in 2 syringes. Remove blue vial access device. Attach syringe to nasal atomizer for intranasal administration. Give 172min right nostril x 2 for seizures lasting 2 minutes or longer or for repetitive seizures in a short period of time., Disp: 6 mL, Rfl: 3 .  Multiple Vitamins-Minerals (MULTIVITAMIN) LIQD, 10 mLs by Feeding Tube route daily. , Disp: , Rfl:  .  Multiple Vitamins-Minerals (ZINC PO), 5 mLs by PEG Tube route daily. 244 mg / 5 mL zinc solution, Disp: , Rfl:  .  mupirocin ointment  (BACTROBAN) 2 %, Apply 1 application topically as needed (to G-T site)., Disp: 22 g, Rfl: 1 .  Nutritional Supplements (PROMOD PO), 30 mLs by Feeding Tube route 2 (two) times daily. Noon and 10pm, Disp: , Rfl:  .  Nutritional Supplements (TWOCAL HN) LIQD, 237 mLs by Feeding Tube route. T.3 can at 46 cc hour x 12 hours - water 172 cc pre and post each and extra 172 bid., Disp: , Rfl:  .  OnabotulinumtoxinA (BOTOX IJ), Inject 300 Units as directed See admin instructions. Every 3 months, Disp: , Rfl:  .  OVER THE COUNTER MEDICATION, Apply 1 application topically as needed (after diaper changes). Balmex and Bag Balm paste made and applied after diaper changes, Disp: , Rfl:  .  OXYGEN-HELIUM IN, Inhale into the lungs. Oxygen PRN to keep O2 Sat at 90%, Disp: , Rfl:  .  potassium chloride (KLOR-CON) 20 MEQ packet, 20 mEq by Feeding Tube route daily. , Disp: , Rfl:  .  Pseudoephedrine HCl (SUDAFED CHILDRENS) 15 MG/5ML LIQD, Give 90 mg by tube 3 (three) times daily. , Disp: , Rfl:  .  simethicone (MYLICON) 12409G chewable tablet, 125 mg by Feeding Tube route every 6 (six) hours as needed for flatulence. , Disp: , Rfl:  .  sodium phosphate (FLEET) enema, Place 1 enema rectally. follow package directions PRN, Disp: , Rfl:  .  sucralfate (CARAFATE) 1 GM/10ML suspension, Place 1 g into feeding tube 4 (four) times daily. 7am, 130pm, 5pm, 8pm, Disp: , Rfl:  .  Water For Irrigation, Sterile (FREE WATER) SOLN, Place 172 mLs into feeding tube 4 (four) times daily., Disp: , Rfl:  .  zinc oxide (BALMEX) 11.3 % CREA cream, Apply 1 application topically 5 (five) times daily. With each diaper change., Disp: , Rfl:   Current facility-administered medications:  .  botulinum toxin Type A (BOTOX) injection 300 Units, 300 Units, Intramuscular, Once, Jeffery [name]  Allergies:  Allergies  Allergen Reactions  . Vimpat [Lacosamide] Rash  . Adhesive [Tape]     Rips skin off  . Depakote [Divalproex Sodium]     Causes  pancreatitis   . Keppra [Levetiracetam] Other (See Comments)    Bone marrow suppression  . Amoxicillin-Pot Clavulanate Rash    Give w fluconazole then no rash  . Neulasta [Pegfilgrastim] Palpitations    Past Medical History, Surgical history, Social history, and Family History were reviewed and updated.  Review of Systems: As above  Physical Exam:  oral temperature is 97.8 F (36.6 C). His blood pressure is 118/70 and his pulse is 121. His respiration is 22.   Debilitated young male. He has obvious changes consistent with cerebral palsy. He has no obvious oral lesions. He has no adenopathy. Lungs are clear. Cardiac exam  tachycardic rate and regular rhythm with no murmurs, rubs or bruits. Abdomen is soft. He is slightly obese. He has no obvious hepatosplenomegaly. Extremities shows joint changes consistent  with his cerebral palsy. He does have an implantable baclofen pump in the right lower quadrant of his abdomen. Skin exam shows no obvious rashes. There is no ecchymoses or petechia. Neurological exam shows the spasticity and dysarthria from his cerebral palsy  Lab Results  Component Value Date   WBC 2.0* 05/22/2014   HGB 9.2* 05/22/2014   HCT 28.8* 05/22/2014   MCV 100* 05/22/2014   PLT 119* 05/22/2014     Chemistry      Component Value Date/Time   NA 144 05/22/2014 1046   NA 140 05/11/2014 1425   K 3.6 05/22/2014 1046   K 3.8 05/11/2014 1425   CL 97* 05/22/2014 1046   CL 99 05/11/2014 1425   CO2 33 05/22/2014 1046   CO2 30 05/11/2014 1425   BUN 21 05/22/2014 1046   BUN 9 05/11/2014 1425   CREATININE 0.6 05/22/2014 1046   CREATININE 0.54 05/11/2014 1425      Component Value Date/Time   CALCIUM 9.5 05/22/2014 1046   CALCIUM 9.3 05/11/2014 1425   ALKPHOS 146* 05/22/2014 1046   ALKPHOS 127* 05/11/2014 1425   AST 31 05/22/2014 1046   AST 33 05/11/2014 1425   ALT 50* 05/22/2014 1046   ALT 59* 05/11/2014 1425   BILITOT 2.10* 05/22/2014 1046   BILITOT 1.5* 05/11/2014  1425     Normochromic and normocytic red blood cells. I see no nucleated red cells. There is no teardrop cells. There is no rouleau formation. There is no inclusion bodies. White cells. Normal in morphology and maturation. There are no hypersegmented polys. There is no immature myeloid or lymphoid forms. Platelets are mildly decreased in number.    Impression and Plan: Jeffery [name] is 24 year old male. He had pancytopenia . I will go ahead and give him a dose of Neupogen today.  We will plan to get him back to see [place] in another 10 days so we can see how his blood counts look.  I just don't see that a bone marrow biopsy is going to change our management on him. No metabolic find on the biopsy, it's not could be treatable. I will just support him along as we are doing right now.  Volanda Napoleon, MD 4/20/20165:09 PM

## 2014-05-29 ENCOUNTER — Telehealth: Payer: Self-pay | Admitting: Family

## 2014-05-29 NOTE — Telephone Encounter (Signed)
Noted, thank you

## 2014-05-29 NOTE — Telephone Encounter (Signed)
[name] called about Jeffery [name]. She said that since the Nebo was increased to TID earlier this month, he has had no seizures. Mom said that he is a little sleepy during the day at times but he has done that since the bone marrow issue so she doesn't think it is related to the [facility]. She just wanted to let Dr Gaynell Face know and said there was no need to call back. TG

## 2014-05-31 ENCOUNTER — Telehealth: Payer: Self-pay | Admitting: Hematology & Oncology

## 2014-05-31 ENCOUNTER — Ambulatory Visit: Payer: Medicaid Other | Admitting: Hematology & Oncology

## 2014-05-31 ENCOUNTER — Ambulatory Visit: Payer: Medicaid Other

## 2014-05-31 ENCOUNTER — Other Ambulatory Visit: Payer: Medicaid Other

## 2014-05-31 NOTE — Telephone Encounter (Signed)
Patient's mother cx appt due to not being able to wait... She resch for 06/03/14.  rn was notified of cx apt

## 2014-06-03 ENCOUNTER — Ambulatory Visit (HOSPITAL_BASED_OUTPATIENT_CLINIC_OR_DEPARTMENT_OTHER): Payer: Medicaid Other

## 2014-06-03 ENCOUNTER — Ambulatory Visit (HOSPITAL_BASED_OUTPATIENT_CLINIC_OR_DEPARTMENT_OTHER): Payer: Medicaid Other | Admitting: Hematology & Oncology

## 2014-06-03 ENCOUNTER — Ambulatory Visit (HOSPITAL_COMMUNITY)
Admission: RE | Admit: 2014-06-03 | Discharge: 2014-06-03 | Disposition: A | Discharge status: Home / Self Care | Payer: Medicaid Other | Source: Ambulatory Visit | Attending: Hematology & Oncology | Admitting: Hematology & Oncology

## 2014-06-03 ENCOUNTER — Other Ambulatory Visit (HOSPITAL_BASED_OUTPATIENT_CLINIC_OR_DEPARTMENT_OTHER): Payer: Medicaid Other

## 2014-06-03 ENCOUNTER — Encounter: Payer: Self-pay | Admitting: Hematology & Oncology

## 2014-06-03 VITALS — BP 102/47 | HR 108 | Temp 98.0°F | Resp 24 | Ht <= 58 in | Wt 124.0 lb

## 2014-06-03 DIAGNOSIS — D61818 Other pancytopenia: Secondary | ICD-10-CM

## 2014-06-03 DIAGNOSIS — D4620 Refractory anemia with excess of blasts, unspecified: Secondary | ICD-10-CM

## 2014-06-03 DIAGNOSIS — G808 Other cerebral palsy: Secondary | ICD-10-CM

## 2014-06-03 DIAGNOSIS — D46Z Other myelodysplastic syndromes: Secondary | ICD-10-CM | POA: Diagnosis present

## 2014-06-03 LAB — CBC WITH DIFFERENTIAL (CANCER CENTER ONLY)
BASO#: 0 10*3/uL (ref 0.0–0.2)
BASO%: 0.7 % (ref 0.0–2.0)
EOS%: 3.6 % (ref 0.0–7.0)
Eosinophils Absolute: 0.1 10*3/uL (ref 0.0–0.5)
HCT: 23.7 % — ABNORMAL LOW (ref 38.7–49.9)
HEMOGLOBIN: 7.4 g/dL — AB (ref 13.0–17.1)
LYMPH#: 0.4 10*3/uL — ABNORMAL LOW (ref 0.9–3.3)
LYMPH%: 29.7 % (ref 14.0–48.0)
MCH: 33 pg (ref 28.0–33.4)
MCHC: 31.2 g/dL — ABNORMAL LOW (ref 32.0–35.9)
MCV: 106 fL — AB (ref 82–98)
MONO#: 0.2 10*3/uL (ref 0.1–0.9)
MONO%: 17.4 % — AB (ref 0.0–13.0)
NEUT#: 0.7 10*3/uL — ABNORMAL LOW (ref 1.5–6.5)
NEUT%: 48.6 % (ref 40.0–80.0)
PLATELETS: 142 10*3/uL — AB (ref 145–400)
RBC: 2.24 10*6/uL — ABNORMAL LOW (ref 4.20–5.70)
RDW: 24.4 % — ABNORMAL HIGH (ref 11.1–15.7)
WBC: 1.4 10*3/uL — ABNORMAL LOW (ref 4.0–10.0)

## 2014-06-03 LAB — PREPARE RBC (CROSSMATCH)

## 2014-06-03 LAB — ABO/RH: ABO/RH(D): O POS

## 2014-06-03 LAB — CHCC SATELLITE - SMEAR

## 2014-06-03 MED ORDER — FILGRASTIM 480 MCG/0.8ML IJ SOSY
480.0000 ug | PREFILLED_SYRINGE | Freq: Once | INTRAMUSCULAR | Status: AC
  Administered 2014-06-03: 480 ug via SUBCUTANEOUS
  Filled 2014-06-03: qty 0.8

## 2014-06-03 MED ORDER — HEPARIN SOD (PORK) LOCK FLUSH 100 UNIT/ML IV SOLN
500.0000 [IU] | Freq: Once | INTRAVENOUS | Status: AC
  Administered 2014-06-03: 500 [IU] via INTRAVENOUS
  Filled 2014-06-03: qty 5

## 2014-06-03 MED ORDER — SODIUM CHLORIDE 0.9 % IJ SOLN
10.0000 mL | INTRAMUSCULAR | Status: DC | PRN
  Filled 2014-06-03: qty 10

## 2014-06-03 MED ORDER — HEPARIN SOD (PORK) LOCK FLUSH 100 UNIT/ML IV SOLN
500.0000 [IU] | Freq: Once | INTRAVENOUS | Status: DC
Start: 2014-06-03 — End: 2014-06-03
  Filled 2014-06-03: qty 5

## 2014-06-03 MED ORDER — SODIUM CHLORIDE 0.9 % IJ SOLN
10.0000 mL | INTRAMUSCULAR | Status: DC | PRN
  Administered 2014-06-03: 10 mL via INTRAVENOUS
  Filled 2014-06-03: qty 10

## 2014-06-03 NOTE — Progress Notes (Signed)
Hematology and Oncology Follow Up Visit  Jeffery [name] [id number] July 29, 1990 24 y.o. 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  Jeffery [name] is back for follow-up. He seems to be doing okay. According to his parents, he seems to be doing fairly well. He has had no problems with fever. He's not been back to the hospital.  He's had no seizures. He has been off Keppra.  He's had no rashes. He's had no leg swelling. He's had no cough. There's been no obvious shortness of breath.  He does have a baclofen pump which is helping with his spasms.  Medications:  Current outpatient prescriptions:  .  Acetaminophen (TYLENOL PO), Place 650 mg into feeding tube every 6 (six) hours as needed (fever)., Disp: , Rfl:  .  albuterol (PROVENTIL HFA;VENTOLIN HFA) 108 (90 BASE) MCG/ACT inhaler, Inhale 2 puffs into the lungs every 4 (four) hours as needed for shortness of breath., Disp: , Rfl:  .  albuterol (PROVENTIL) (2.5 MG/3ML) 0.083% nebulizer solution, Take 2.5 mg by nebulization. 1 ampule twice daily and as needed (making 4 doses daily), Disp: , Rfl:  .  Ascorbic Acid (VITAMIN C) 500 MG/5ML LIQD, 300 mg by PEG Tube route every morning. , Disp: , Rfl:  .  baclofen (GABLOFEN) 40000 MCG/20ML SOLN, 385.2 mcg by Intrathecal route continuous. , Disp: , Rfl:  .  calcium carbonate, dosed in mg elemental calcium, 1250 MG/5ML, 1,250 mg of elemental calcium by PEG Tube route 3 (three) times daily. 5 cc via peg tube, Disp: , Rfl:  .  cloBAZam (ONFI) 2.5 MG/ML solution, Place 2.5 mL in gastrostomy tube 3 times daily, Disp: 240 mL, Rfl: 5 .  DIASTAT ACUDIAL 20 MG GEL, Place 12.5 mg rectally as needed. For seizure lasting 2 minutes or longer or repetitive seizures.  No more than 1 dose every 12 hours or nasal versed (not both)., Disp: , Rfl: 5 .  dicyclomine (BENTYL) 10 MG/5ML  syrup, Place into feeding tube every 8 (eight) hours as needed. Not to exceed 5 doses in 1 wk, Disp: , Rfl:  .  folic acid (FOLVITE) 1 MG tablet, 1 mg by Feeding Tube route daily. , Disp: , Rfl:  .  furosemide (LASIX) 10 MG/ML solution, Place 20 mg into feeding tube daily as needed for fluid. , Disp: , Rfl:  .  gabapentin (NEURONTIN) 250 MG/5ML solution, Take 18 mL 3 times daily (Patient taking differently: Place 900 mg into feeding tube 3 (three) times daily. ), Disp: 1620 mL, Rfl: 5 .  ibuprofen (ADVIL,MOTRIN) 100 MG/5ML suspension, Place 400 mg into feeding tube every 6 (six) hours as needed for fever (pain)., Disp: , Rfl:  .  ketoconazole (NIZORAL) 2 % cream, Apply 1 application topically 2 (two) times daily as needed for irritation (yeast rash from antibiotics). , Disp: , Rfl:  .  lansoprazole (PREVACID SOLUTAB) 30 MG disintegrating tablet, 30 mg by Feeding Tube route 2 (two) times daily. 730am, Disp: , Rfl:  .  midazolam (VERSED) 5 MG/ML injection, Place 2 mLs (10 mg total) into the nose once. Draw up 39ml in 2 syringes. Remove blue vial access device. Attach syringe to nasal atomizer for intranasal administration. Give 49ml in right nostril x 2 for seizures lasting 2 minutes or longer or for repetitive seizures in a short period of time., Disp: 6 mL, Rfl:  3 .  Multiple Vitamins-Minerals (MULTIVITAMIN) LIQD, 10 mLs by Feeding Tube route daily. , Disp: , Rfl:  .  Multiple Vitamins-Minerals (ZINC PO), 5 mLs by PEG Tube route daily. 244 mg / 5 mL zinc solution, Disp: , Rfl:  .  mupirocin ointment (BACTROBAN) 2 %, Apply 1 application topically as needed (to G-T site)., Disp: 22 g, Rfl: 1 .  Nutritional Supplements (PROMOD PO), 30 mLs by Feeding Tube route 2 (two) times daily. Noon and 10pm, Disp: , Rfl:  .  Nutritional Supplements (TWOCAL HN) LIQD, 237 mLs by Feeding Tube route. T.3 can at 46 cc hour x 12 hours - water 172 cc pre and post each and extra 172 bid., Disp: , Rfl:  .  OnabotulinumtoxinA  (BOTOX IJ), Inject 300 Units as directed See admin instructions. Every 3 months, Disp: , Rfl:  .  OVER THE COUNTER MEDICATION, Apply 1 application topically as needed (after diaper changes). Balmex and Bag Balm paste made and applied after diaper changes, Disp: , Rfl:  .  OXYGEN-HELIUM IN, Inhale into the lungs. Oxygen PRN to keep O2 Sat at 90%, Disp: , Rfl:  .  potassium chloride (KLOR-CON) 20 MEQ packet, 20 mEq by Feeding Tube route daily. , Disp: , Rfl:  .  Pseudoephedrine HCl (SUDAFED CHILDRENS) 15 MG/5ML LIQD, Give 90 mg by tube 3 (three) times daily. , Disp: , Rfl:  .  simethicone (MYLICON) 419 MG chewable tablet, 125 mg by Feeding Tube route every 6 (six) hours as needed for flatulence. , Disp: , Rfl:  .  sodium phosphate (FLEET) enema, Place 1 enema rectally. follow package directions PRN, Disp: , Rfl:  .  sucralfate (CARAFATE) 1 GM/10ML suspension, Place 1 g into feeding tube 4 (four) times daily. 7am, 130pm, 5pm, 8pm, Disp: , Rfl:  .  Water For Irrigation, Sterile (FREE WATER) SOLN, Place 172 mLs into feeding tube 4 (four) times daily., Disp: , Rfl:  .  zinc oxide (BALMEX) 11.3 % CREA cream, Apply 1 application topically 5 (five) times daily. With each diaper change., Disp: , Rfl:   Current facility-administered medications:  .  botulinum toxin Type A (BOTOX) injection 300 Units, 300 Units, Intramuscular, Once, Marcial Pacas, MD  Allergies:  Allergies  Allergen Reactions  . Vimpat [Lacosamide] Rash  . Adhesive [Tape]     Rips skin off  . Depakote [Divalproex Sodium]     Causes pancreatitis   . Keppra [Levetiracetam] Other (See Comments)    Bone marrow suppression  . Amoxicillin-Pot Clavulanate Rash    Give w fluconazole then no rash  . Neulasta [Pegfilgrastim] Palpitations    Past Medical History, Surgical history, Social history, and Family History were reviewed and updated.  Review of Systems: As above  Physical Exam:  height is 4\' 2"  (1.27 m) and weight is 124 lb (56.246  kg). His oral temperature is 98 F (36.7 C). His blood pressure is 102/47 and his pulse is 108. His respiration is 24.   Special needs young male. He has obvious changes consistent with cerebral palsy. He has no obvious oral lesions. He has no adenopathy. Lungs are clear. Cardiac exam tachycardic rate and regular rhythm with no murmurs, rubs or bruits. Abdomen is soft. He is slightly obese. He has no obvious hepatosplenomegaly. Extremities shows joint changes consistent  with his cerebral palsy. He does have an implantable baclofen pump in the right lower quadrant of his abdomen. Skin exam shows no obvious rashes. There is no ecchymoses or petechia. Neurological exam  shows the spasticity and dysarthria from his cerebral palsy  Lab Results  Component Value Date   WBC 1.4* 06/03/2014   HGB 7.4* 06/03/2014   HCT 23.7* 06/03/2014   MCV 106* 06/03/2014   PLT 142* 06/03/2014     Chemistry      Component Value Date/Time   NA 144 05/22/2014 1046   NA 140 05/11/2014 1425   K 3.6 05/22/2014 1046   K 3.8 05/11/2014 1425   CL 97* 05/22/2014 1046   CL 99 05/11/2014 1425   CO2 33 05/22/2014 1046   CO2 30 05/11/2014 1425   BUN 21 05/22/2014 1046   BUN 9 05/11/2014 1425   CREATININE 0.6 05/22/2014 1046   CREATININE 0.54 05/11/2014 1425      Component Value Date/Time   CALCIUM 9.5 05/22/2014 1046   CALCIUM 9.3 05/11/2014 1425   ALKPHOS 146* 05/22/2014 1046   ALKPHOS 127* 05/11/2014 1425   AST 31 05/22/2014 1046   AST 33 05/11/2014 1425   ALT 50* 05/22/2014 1046   ALT 59* 05/11/2014 1425   BILITOT 2.10* 05/22/2014 1046   BILITOT 1.5* 05/11/2014 1425     Normochromic and normocytic red blood cells. I see no nucleated red cells. There is no teardrop cells. There is no rouleau formation. There is no inclusion bodies. White cells. Normal in morphology and maturation. There are no hypersegmented polys. There is no immature myeloid or lymphoid forms. Platelets are mildly decreased in  number.    Impression and Plan: Mr. Ostrovsky is 24 year old male. He had pancytopenia . It is somewhat concerning that he is quite anemic and his white cell count is dropping area and  I'm checking his blood for iron studies. I think that he might be iron deficient. I just don't think that he absorbs iron. I don't think he is bleeding.  I will give him a dose of Neupogen today.  We will go ahead and transfuse him 2 units of blood. I think this will help.  I still do not think that he needs a bone marrow test. Given his overall situation and his body habitus, I really don't think that a bone marrow would be easy to do and it would not change our management of him.  I will plan to get back to see [place] in another 10 days.     Volanda Napoleon, MD 5/2/20165:52 PM

## 2014-06-03 NOTE — Patient Instructions (Signed)
Filgrastim, G-CSF injection  What is this medicine?  FILGRASTIM, G-CSF (fil GRA stim) is a granulocyte colony-stimulating factor that stimulates the growth of neutrophils, a type of white blood cell (WBC) important in the body's fight against infection. It is used to reduce the incidence of fever and infection in patients with certain types of cancer who are receiving chemotherapy that affects the bone marrow, to stimulate blood cell production for removal of WBCs from the body prior to a bone marrow transplantation, to reduce the incidence of fever and infection in patients who have severe chronic neutropenia, and to improve survival outcomes following high-dose radiation exposure that is toxic to the bone marrow.  This medicine may be used for other purposes; ask your health care provider or pharmacist if you have questions.  COMMON BRAND NAME(S): Neupogen  What should I tell my health care provider before I take this medicine?  They need to know if you have any of these conditions:  -latex allergy  -ongoing radiation therapy  -sickle cell disease  -an unusual or allergic reaction to filgrastim, pegfilgrastim, other medicines, foods, dyes, or preservatives  -pregnant or trying to get pregnant  -breast-feeding  How should I use this medicine?  This medicine is for injection under the skin. If you get this medicine at home, you will be taught how to prepare and give this medicine. Refer to the Instructions for Use that come with your medication packaging. Use exactly as directed. Take your medicine at regular intervals. Do not take your medicine more often than directed.  It is important that you put your used needles and syringes in a special sharps container. Do not put them in a trash can. If you do not have a sharps container, call your pharmacist or healthcare provider to get one.  Talk to your pediatrician regarding the use of this medicine in children. While this drug may be prescribed for children as young  as 7 months for selected conditions, precautions do apply.  Overdosage: If you think you have taken too much of this medicine contact a poison control center or emergency room at once.  NOTE: This medicine is only for you. Do not share this medicine with others.  What if I miss a dose?  It is important not to miss your dose. Call your doctor or health care professional if you miss a dose.  What may interact with this medicine?  This medicine may interact with the following medications:  -medicines that may cause a release of neutrophils, such as lithium  This list may not describe all possible interactions. Give your health care provider a list of all the medicines, herbs, non-prescription drugs, or dietary supplements you use. Also tell them if you smoke, drink alcohol, or use illegal drugs. Some items may interact with your medicine.  What should I watch for while using this medicine?  You may need blood work done while you are taking this medicine.  What side effects may I notice from receiving this medicine?  Side effects that you should report to your doctor or health care professional as soon as possible:  -allergic reactions like skin rash, itching or hives, swelling of the face, lips, or tongue  -dizziness or feeling faint  -fever  -pain, redness, or irritation at site where injected  -pinpoint red spots on the skin  -shortness of breath or breathing problems  -stomach or side pain, or pain at the shoulder  -swelling  -tiredness  -trouble passing urine  -unusual   bleeding or bruising  Side effects that usually do not require medical attention (report to your doctor or health care professional if they continue or are bothersome):  -bone pain  -headache  -muscle pain  This list may not describe all possible side effects. Call your doctor for medical advice about side effects. You may report side effects to FDA at 1-800-FDA-1088.  Where should I keep my medicine?  Keep out of the reach of children.  Store in a  refrigerator between 2 and 8 degrees C (36 and 46 degrees F). Do not freeze. Keep in carton to protect from light. Throw away this medicine if vials or syringes are left out of the refrigerator for more than 24 hours. Throw away any unused medicine after the expiration date.  NOTE: This sheet is a summary. It may not cover all possible information. If you have questions about this medicine, talk to your doctor, pharmacist, or health care provider.   2015, [employer]. (2013-05-04 17:00:01)

## 2014-06-04 LAB — FERRITIN CHCC: Ferritin: 839 ng/ml — ABNORMAL HIGH (ref 22–316)

## 2014-06-04 LAB — IRON AND TIBC CHCC
%SAT: 23 % (ref 20–55)
Iron: 62 ug/dL (ref 42–163)
TIBC: 266 ug/dL (ref 202–409)
UIBC: 204 ug/dL (ref 117–376)

## 2014-06-05 ENCOUNTER — Encounter: Payer: Self-pay | Admitting: Hematology & Oncology

## 2014-06-05 ENCOUNTER — Telehealth: Payer: Self-pay | Admitting: Hematology & Oncology

## 2014-06-05 ENCOUNTER — Ambulatory Visit (HOSPITAL_BASED_OUTPATIENT_CLINIC_OR_DEPARTMENT_OTHER): Payer: Medicaid Other

## 2014-06-05 VITALS — BP 126/72 | HR 104 | Temp 97.2°F | Resp 20

## 2014-06-05 DIAGNOSIS — Z5189 Encounter for other specified aftercare: Secondary | ICD-10-CM

## 2014-06-05 DIAGNOSIS — D649 Anemia, unspecified: Secondary | ICD-10-CM

## 2014-06-05 DIAGNOSIS — D709 Neutropenia, unspecified: Secondary | ICD-10-CM

## 2014-06-05 DIAGNOSIS — D4620 Refractory anemia with excess of blasts, unspecified: Secondary | ICD-10-CM

## 2014-06-05 DIAGNOSIS — D61818 Other pancytopenia: Secondary | ICD-10-CM | POA: Diagnosis not present

## 2014-06-05 DIAGNOSIS — R5081 Fever presenting with conditions classified elsewhere: Secondary | ICD-10-CM

## 2014-06-05 DIAGNOSIS — D46Z Other myelodysplastic syndromes: Secondary | ICD-10-CM | POA: Diagnosis not present

## 2014-06-05 MED ORDER — HEPARIN SOD (PORK) LOCK FLUSH 100 UNIT/ML IV SOLN
500.0000 [IU] | Freq: Every day | INTRAVENOUS | Status: AC | PRN
  Administered 2014-06-05: 500 [IU]
  Filled 2014-06-05: qty 5

## 2014-06-05 MED ORDER — FUROSEMIDE 10 MG/ML IJ SOLN
INTRAMUSCULAR | Status: AC
  Filled 2014-06-05: qty 4

## 2014-06-05 MED ORDER — SODIUM CHLORIDE 0.9 % IV SOLN
510.0000 mg | Freq: Once | INTRAVENOUS | Status: AC
  Administered 2014-06-05: 510 mg via INTRAVENOUS
  Filled 2014-06-05: qty 17

## 2014-06-05 MED ORDER — FILGRASTIM 480 MCG/0.8ML IJ SOSY
480.0000 ug | PREFILLED_SYRINGE | Freq: Once | INTRAMUSCULAR | Status: AC
  Administered 2014-06-05: 480 ug via SUBCUTANEOUS
  Filled 2014-06-05: qty 0.8

## 2014-06-05 MED ORDER — SODIUM CHLORIDE 0.9 % IV SOLN
250.0000 mL | Freq: Once | INTRAVENOUS | Status: AC
  Administered 2014-06-05: 250 mL via INTRAVENOUS

## 2014-06-05 MED ORDER — ACETAMINOPHEN 325 MG PO TABS: 650.0000 mg | ORAL_TABLET | Freq: Once | ORAL | Status: DC

## 2014-06-05 MED ORDER — FUROSEMIDE 10 MG/ML IJ SOLN
20.0000 mg | Freq: Once | INTRAMUSCULAR | Status: AC
  Administered 2014-06-05: 20 mg via INTRAVENOUS

## 2014-06-05 MED ORDER — SODIUM CHLORIDE 0.9 % IJ SOLN
10.0000 mL | INTRAMUSCULAR | Status: AC | PRN
  Administered 2014-06-05: 10 mL
  Filled 2014-06-05: qty 10

## 2014-06-05 NOTE — Patient Instructions (Signed)
Ferumoxytol injection What is this medicine? FERUMOXYTOL is an iron complex. Iron is used to make healthy red blood cells, which carry oxygen and nutrients throughout the body. This medicine is used to treat iron deficiency anemia in people with chronic kidney disease. This medicine may be used for other purposes; ask your health care provider or pharmacist if you have questions. COMMON BRAND NAME(S): Feraheme What should I tell my health care provider before I take this medicine? They need to know if you have any of these conditions: -anemia not caused by low iron levels -high levels of iron in the blood -magnetic resonance imaging (MRI) test scheduled -an unusual or allergic reaction to iron, other medicines, foods, dyes, or preservatives -pregnant or trying to get pregnant -breast-feeding How should I use this medicine? This medicine is for injection into a vein. It is given by a health care professional in a hospital or clinic setting. Talk to your pediatrician regarding the use of this medicine in children. Special care may be needed. Overdosage: If you think you've taken too much of this medicine contact a poison control center or emergency room at once. Overdosage: If you think you have taken too much of this medicine contact a poison control center or emergency room at once. NOTE: This medicine is only for you. Do not share this medicine with others. What if I miss a dose? It is important not to miss your dose. Call your doctor or health care professional if you are unable to keep an appointment. What may interact with this medicine? This medicine may interact with the following medications: -other iron products This list may not describe all possible interactions. Give your health care provider a list of all the medicines, herbs, non-prescription drugs, or dietary supplements you use. Also tell them if you smoke, drink alcohol, or use illegal drugs. Some items may interact with your  medicine. What should I watch for while using this medicine? Visit your doctor or healthcare professional regularly. Tell your doctor or healthcare professional if your symptoms do not start to get better or if they get worse. You may need blood work done while you are taking this medicine. You may need to follow a special diet. Talk to your doctor. Foods that contain iron include: whole grains/cereals, dried fruits, beans, or peas, leafy green vegetables, and organ meats (liver, kidney). What side effects may I notice from receiving this medicine? Side effects that you should report to your doctor or health care professional as soon as possible: -allergic reactions like skin rash, itching or hives, swelling of the face, lips, or tongue -breathing problems -changes in blood pressure -feeling faint or lightheaded, falls -fever or chills -flushing, sweating, or hot feelings -swelling of the ankles or feet Side effects that usually do not require medical attention (Report these to your doctor or health care professional if they continue or are bothersome.): -diarrhea -headache -nausea, vomiting -stomach pain This list may not describe all possible side effects. Call your doctor for medical advice about side effects. You may report side effects to FDA at 1-800-FDA-1088. Where should I keep my medicine? This drug is given in a hospital or clinic and will not be stored at home. NOTE: This sheet is a summary. It may not cover all possible information. If you have questions about this medicine, talk to your doctor, pharmacist, or health care provider.  2015, Elsevier/Gold Standard. (2011-09-03 15:23:36)  Blood Transfusion Information WHAT IS A BLOOD TRANSFUSION? A transfusion is the replacement   of blood or some of its parts. Blood is made up of multiple cells which provide different functions.  Red blood cells carry oxygen and are used for blood loss replacement.  White blood cells fight  against infection.  Platelets control bleeding.  Plasma helps clot blood.  Other blood products are available for specialized needs, such as hemophilia or other clotting disorders. BEFORE THE TRANSFUSION  Who gives blood for transfusions?   You may be able to donate blood to be used at a later date on yourself (autologous donation).  Relatives can be asked to donate blood. This is generally not any safer than if you have received blood from a stranger. The same precautions are taken to ensure safety when a relative's blood is donated.  Healthy volunteers who are fully evaluated to make sure their blood is safe. This is blood bank blood. Transfusion therapy is the safest it has ever been in the practice of medicine. Before blood is taken from a donor, a complete history is taken to make sure that person has no history of diseases nor engages in risky social behavior (examples are intravenous drug use or sexual activity with multiple partners). The donor's travel history is screened to minimize risk of transmitting infections, such as malaria. The donated blood is tested for signs of infectious diseases, such as HIV and hepatitis. The blood is then tested to be sure it is compatible with you in order to minimize the chance of a transfusion reaction. If you or a relative donates blood, this is often done in anticipation of surgery and is not appropriate for emergency situations. It takes many days to process the donated blood. RISKS AND COMPLICATIONS Although transfusion therapy is very safe and saves many lives, the main dangers of transfusion include:   Getting an infectious disease.  Developing a transfusion reaction. This is an allergic reaction to something in the blood you were given. Every precaution is taken to prevent this. The decision to have a blood transfusion has been considered carefully by your caregiver before blood is given. Blood is not given unless the benefits outweigh the  risks. AFTER THE TRANSFUSION  Right after receiving a blood transfusion, you will usually feel much better and more energetic. This is especially true if your red blood cells have gotten low (anemic). The transfusion raises the level of the red blood cells which carry oxygen, and this usually causes an energy increase.  The nurse administering the transfusion will monitor you carefully for complications. HOME CARE INSTRUCTIONS  No special instructions are needed after a transfusion. You may find your energy is better. Speak with your caregiver about any limitations on activity for underlying diseases you may have. SEEK MEDICAL CARE IF:   Your condition is not improving after your transfusion.  You develop redness or irritation at the intravenous (IV) site. SEEK IMMEDIATE MEDICAL CARE IF:  Any of the following symptoms occur over the next 12 hours:  Shaking chills.  You have a temperature by mouth above 102 F (38.9 C), not controlled by medicine.  Chest, back, or muscle pain.  People around you feel you are not acting correctly or are confused.  Shortness of breath or difficulty breathing.  Dizziness and fainting.  You get a rash or develop hives.  You have a decrease in urine output.  Your urine turns a dark color or changes to pink, red, or brown. Any of the following symptoms occur over the next 10 days:  You have a temperature   by mouth above 102 F (38.9 C), not controlled by medicine.  Shortness of breath.  Weakness after normal activity.  The white part of the eye turns yellow (jaundice).  You have a decrease in the amount of urine or are urinating less often.  Your urine turns a dark color or changes to pink, red, or brown. Document Released: 01/16/2000 Document Revised: 04/12/2011 Document Reviewed: 09/04/2007 ExitCare Patient Information 2015 [employer]. This information is not intended to replace advice given to you by your health care provider. Make  sure you discuss any questions you have with your health care provider.   

## 2014-06-05 NOTE — Telephone Encounter (Signed)
/  Patient wanted to cx 06/18/14 apt and resch for 06/14/14.  Even though Md is on call.  Per Md to sch apt for patient on the 13th

## 2014-06-06 LAB — TYPE AND SCREEN
ABO/RH(D): O POS
ANTIBODY SCREEN: NEGATIVE
UNIT DIVISION: 0
Unit division: 0

## 2014-06-07 ENCOUNTER — Encounter: Payer: Self-pay | Admitting: Hematology & Oncology

## 2014-06-11 ENCOUNTER — Telehealth: Payer: Self-pay | Admitting: *Deleted

## 2014-06-11 NOTE — Telephone Encounter (Signed)
Mother would like home health to collect labs tomorrow morning prior to appointment. Patient is not feeling well and she feels like this would be easier on him. [place] with orders. Spoke with [employer]. Labs will be drawn tomorrow.  Mother aware of orders placed.

## 2014-06-12 ENCOUNTER — Other Ambulatory Visit: Payer: Medicaid Other

## 2014-06-12 ENCOUNTER — Ambulatory Visit: Payer: Medicaid Other | Admitting: Hematology & Oncology

## 2014-06-12 ENCOUNTER — Telehealth: Payer: Self-pay | Admitting: *Deleted

## 2014-06-12 ENCOUNTER — Telehealth: Payer: Self-pay | Admitting: Pediatrics

## 2014-06-12 ENCOUNTER — Ambulatory Visit: Payer: Medicaid Other

## 2014-06-12 NOTE — Telephone Encounter (Signed)
Confirming results of laboratory studies.  He continues to get medications to stimulate his bone marrow and transfusions.  The blood counts are looking better generally.  He has not had any seizures in about a month and is tolerating Onfi at 3 times per day.

## 2014-06-12 NOTE — Telephone Encounter (Signed)
Reviewed labs obtained from [facility] this am. [name] does not need to bring patient in today for treatment. We will reschedule for next week. Spoke with [name] who is thankful for the information. Message sent to scheduler. Today's appointments cancelled.

## 2014-06-14 ENCOUNTER — Ambulatory Visit: Payer: Medicaid Other

## 2014-06-14 ENCOUNTER — Other Ambulatory Visit: Payer: Medicaid Other

## 2014-06-14 ENCOUNTER — Ambulatory Visit: Payer: Medicaid Other | Admitting: Hematology & Oncology

## 2014-06-17 ENCOUNTER — Telehealth: Payer: Self-pay | Admitting: *Deleted

## 2014-06-17 NOTE — Telephone Encounter (Signed)
Patient's mother called stating that patient had a rough weekend with respiratory distress, anxiety and fever. T max 103.4, currently 101.8. She doesn't feel like patient needs to go to hospital, but does want labs and potentially a CXR. Called Heather at [facility] for labs to drawn at home. [name] would like Dr Jobe Igo to be called by mother to let him know of respiratory symptoms and potential  need for cxr or antibiotics. Spoke with Mother who is in agreement of plan.

## 2014-06-17 NOTE — Telephone Encounter (Signed)
Notified mom of CBC results. [name] does not feel like patient needs a Neupogen shot. Patient is anemic again which is worrisome to MD. Mother did call PCP who ordered a CXR be completed. We will make contact with family again tomorrow to see what CXR shows and what changes might have been made to treatment plan.

## 2014-06-18 ENCOUNTER — Ambulatory Visit: Payer: Medicaid Other | Admitting: Hematology & Oncology

## 2014-06-18 ENCOUNTER — Ambulatory Visit: Payer: Medicaid Other

## 2014-06-18 ENCOUNTER — Telehealth: Payer: Self-pay | Admitting: *Deleted

## 2014-06-18 ENCOUNTER — Telehealth: Payer: Self-pay | Admitting: Family

## 2014-06-18 ENCOUNTER — Other Ambulatory Visit: Payer: Medicaid Other

## 2014-06-18 NOTE — Telephone Encounter (Signed)
[name], faxed a copy of [name]'s recent CBC and asked for call back. I called her and she said that Jeffery [name] has not been doing well for the last week or so. He has experienced changes in his oxygen needs, has been lethargic at times, has had intermittent fever and episodes of anxiety. She contacted his PCP and he will be having a chest x-ray today to evaluate for pneumonia. Mom does not feel that he has pneumonia but feels that his symptoms may be due to [facility]. She said that some of these symptoms were listed in the brochure and she wonders if that could be the problem. However, she is pleased that he has not had seizures, and does not want to change the dosage or change the medication. Mom said that she simply wants Dr Gaynell Face to be aware of [name]'s condition. She said that there was no need for Dr Gaynell Face to call her back. TG

## 2014-06-18 NOTE — Telephone Encounter (Signed)
Noted thank you

## 2014-06-18 NOTE — Telephone Encounter (Signed)
Received call from Dr Ronnell [place] office that the CXR was negative for CHF and pneumonia. Patient has an appointment with [place] on Friday. [name] will assess the patient then for any further needs. Mother aware of plan. Doesn't think she can wait until Friday, but will try.

## 2014-06-19 ENCOUNTER — Telehealth: Payer: Self-pay

## 2014-06-19 ENCOUNTER — Inpatient Hospital Stay (HOSPITAL_COMMUNITY)
Admission: EM | Admit: 2014-06-19 | Discharge: 2014-06-27 | DRG: 871 | Disposition: A | Discharge status: Home / Self Care | Payer: Medicaid Other | Attending: Family Medicine | Admitting: Family Medicine

## 2014-06-19 ENCOUNTER — Emergency Department (HOSPITAL_COMMUNITY): Payer: Medicaid Other

## 2014-06-19 DIAGNOSIS — J9611 Chronic respiratory failure with hypoxia: Secondary | ICD-10-CM

## 2014-06-19 DIAGNOSIS — Z7401 Bed confinement status: Secondary | ICD-10-CM

## 2014-06-19 DIAGNOSIS — D61818 Other pancytopenia: Secondary | ICD-10-CM | POA: Diagnosis present

## 2014-06-19 DIAGNOSIS — Z993 Dependence on wheelchair: Secondary | ICD-10-CM

## 2014-06-19 DIAGNOSIS — R509 Fever, unspecified: Secondary | ICD-10-CM | POA: Diagnosis present

## 2014-06-19 DIAGNOSIS — G800 Spastic quadriplegic cerebral palsy: Secondary | ICD-10-CM | POA: Diagnosis present

## 2014-06-19 DIAGNOSIS — D649 Anemia, unspecified: Secondary | ICD-10-CM | POA: Diagnosis not present

## 2014-06-19 DIAGNOSIS — R1310 Dysphagia, unspecified: Secondary | ICD-10-CM | POA: Diagnosis present

## 2014-06-19 DIAGNOSIS — G4733 Obstructive sleep apnea (adult) (pediatric): Secondary | ICD-10-CM | POA: Diagnosis present

## 2014-06-19 DIAGNOSIS — K219 Gastro-esophageal reflux disease without esophagitis: Secondary | ICD-10-CM | POA: Diagnosis present

## 2014-06-19 DIAGNOSIS — G808 Other cerebral palsy: Secondary | ICD-10-CM | POA: Diagnosis present

## 2014-06-19 DIAGNOSIS — Z66 Do not resuscitate: Secondary | ICD-10-CM | POA: Diagnosis present

## 2014-06-19 DIAGNOSIS — G40309 Generalized idiopathic epilepsy and epileptic syndromes, not intractable, without status epilepticus: Secondary | ICD-10-CM | POA: Diagnosis present

## 2014-06-19 DIAGNOSIS — E46 Unspecified protein-calorie malnutrition: Secondary | ICD-10-CM | POA: Diagnosis present

## 2014-06-19 DIAGNOSIS — K56609 Unspecified intestinal obstruction, unspecified as to partial versus complete obstruction: Secondary | ICD-10-CM

## 2014-06-19 DIAGNOSIS — A419 Sepsis, unspecified organism: Principal | ICD-10-CM | POA: Diagnosis present

## 2014-06-19 DIAGNOSIS — M810 Age-related osteoporosis without current pathological fracture: Secondary | ICD-10-CM | POA: Diagnosis present

## 2014-06-19 DIAGNOSIS — E876 Hypokalemia: Secondary | ICD-10-CM | POA: Diagnosis present

## 2014-06-19 DIAGNOSIS — D72819 Decreased white blood cell count, unspecified: Secondary | ICD-10-CM | POA: Diagnosis not present

## 2014-06-19 DIAGNOSIS — Z931 Gastrostomy status: Secondary | ICD-10-CM | POA: Diagnosis not present

## 2014-06-19 DIAGNOSIS — K567 Ileus, unspecified: Secondary | ICD-10-CM | POA: Diagnosis present

## 2014-06-19 DIAGNOSIS — Z6835 Body mass index (BMI) 35.0-35.9, adult: Secondary | ICD-10-CM

## 2014-06-19 DIAGNOSIS — J9621 Acute and chronic respiratory failure with hypoxia: Secondary | ICD-10-CM | POA: Diagnosis present

## 2014-06-19 DIAGNOSIS — IMO0001 Reserved for inherently not codable concepts without codable children: Secondary | ICD-10-CM | POA: Diagnosis present

## 2014-06-19 DIAGNOSIS — G40909 Epilepsy, unspecified, not intractable, without status epilepticus: Secondary | ICD-10-CM | POA: Diagnosis present

## 2014-06-19 DIAGNOSIS — D709 Neutropenia, unspecified: Secondary | ICD-10-CM | POA: Diagnosis present

## 2014-06-19 DIAGNOSIS — D619 Aplastic anemia, unspecified: Secondary | ICD-10-CM | POA: Diagnosis not present

## 2014-06-19 DIAGNOSIS — G809 Cerebral palsy, unspecified: Secondary | ICD-10-CM | POA: Diagnosis not present

## 2014-06-19 DIAGNOSIS — G8250 Quadriplegia, unspecified: Secondary | ICD-10-CM | POA: Diagnosis present

## 2014-06-19 DIAGNOSIS — G40209 Localization-related (focal) (partial) symptomatic epilepsy and epileptic syndromes with complex partial seizures, not intractable, without status epilepticus: Secondary | ICD-10-CM | POA: Diagnosis present

## 2014-06-19 DIAGNOSIS — R5081 Fever presenting with conditions classified elsewhere: Secondary | ICD-10-CM | POA: Diagnosis present

## 2014-06-19 DIAGNOSIS — J9610 Chronic respiratory failure, unspecified whether with hypoxia or hypercapnia: Secondary | ICD-10-CM | POA: Diagnosis present

## 2014-06-19 LAB — URINALYSIS, ROUTINE W REFLEX MICROSCOPIC
GLUCOSE, UA: NEGATIVE mg/dL
Hgb urine dipstick: NEGATIVE
Ketones, ur: NEGATIVE mg/dL
Leukocytes, UA: NEGATIVE
NITRITE: NEGATIVE
PH: 6 (ref 5.0–8.0)
Protein, ur: >300 mg/dL — AB
Specific Gravity, Urine: 1.029 (ref 1.005–1.030)
Urobilinogen, UA: 1 mg/dL (ref 0.0–1.0)

## 2014-06-19 LAB — CBC WITH DIFFERENTIAL/PLATELET
BASOS PCT: 2 % — AB (ref 0–1)
Basophils Absolute: 0 10*3/uL (ref 0.0–0.1)
Eosinophils Absolute: 0 10*3/uL (ref 0.0–0.7)
Eosinophils Relative: 1 % (ref 0–5)
HEMATOCRIT: 25.4 % — AB (ref 39.0–52.0)
HEMOGLOBIN: 7.7 g/dL — AB (ref 13.0–17.0)
LYMPHS PCT: 38 % (ref 12–46)
Lymphs Abs: 0.6 10*3/uL — ABNORMAL LOW (ref 0.7–4.0)
MCH: 32.4 pg (ref 26.0–34.0)
MCHC: 30.3 g/dL (ref 30.0–36.0)
MCV: 106.7 fL — ABNORMAL HIGH (ref 78.0–100.0)
Monocytes Absolute: 0.3 10*3/uL (ref 0.1–1.0)
Monocytes Relative: 18 % — ABNORMAL HIGH (ref 3–12)
NEUTROS ABS: 0.7 10*3/uL — AB (ref 1.7–7.7)
Neutrophils Relative %: 41 % — ABNORMAL LOW (ref 43–77)
Platelets: 190 10*3/uL (ref 150–400)
RBC: 2.38 MIL/uL — ABNORMAL LOW (ref 4.22–5.81)
RDW: 20.8 % — ABNORMAL HIGH (ref 11.5–15.5)
WBC: 1.6 10*3/uL — AB (ref 4.0–10.5)

## 2014-06-19 LAB — I-STAT VENOUS BLOOD GAS, ED
Bicarbonate: 28.2 mEq/L — ABNORMAL HIGH (ref 20.0–24.0)
O2 SAT: 39 %
PCO2 VEN: 57.4 mmHg — AB (ref 45.0–50.0)
PH VEN: 7.3 (ref 7.250–7.300)
TCO2: 30 mmol/L (ref 0–100)
pO2, Ven: 26 mmHg — CL (ref 30.0–45.0)

## 2014-06-19 LAB — COMPREHENSIVE METABOLIC PANEL
ALT: 38 U/L (ref 17–63)
ANION GAP: 9 (ref 5–15)
AST: 23 U/L (ref 15–41)
Albumin: 3.2 g/dL — ABNORMAL LOW (ref 3.5–5.0)
Alkaline Phosphatase: 82 U/L (ref 38–126)
BUN: 20 mg/dL (ref 6–20)
CALCIUM: 8.8 mg/dL — AB (ref 8.9–10.3)
CO2: 31 mmol/L (ref 22–32)
CREATININE: 0.71 mg/dL (ref 0.61–1.24)
Chloride: 104 mmol/L (ref 101–111)
GFR calc Af Amer: >60 mL/min (ref >60)
Glucose, Bld: 102 mg/dL — ABNORMAL HIGH (ref 65–99)
Potassium: 4.3 mmol/L (ref 3.5–5.1)
Sodium: 144 mmol/L (ref 135–145)
Total Bilirubin: 1.2 mg/dL (ref 0.3–1.2)
Total Protein: 6.3 g/dL — ABNORMAL LOW (ref 6.5–8.1)

## 2014-06-19 LAB — URINE MICROSCOPIC-ADD ON

## 2014-06-19 LAB — I-STAT CG4 LACTIC ACID, ED
LACTIC ACID, VENOUS: 1.11 mmol/L (ref 0.5–2.0)
Lactic Acid, Venous: 1.48 mmol/L (ref 0.5–2.0)

## 2014-06-19 LAB — CBG MONITORING, ED
GLUCOSE-CAPILLARY: 110 mg/dL — AB (ref 65–99)
Glucose-Capillary: 118 mg/dL — ABNORMAL HIGH (ref 65–99)

## 2014-06-19 MED ORDER — KETOCONAZOLE 2 % EX CREA: 1.0000 "application " | TOPICAL_CREAM | Freq: Two times a day (BID) | CUTANEOUS | Status: DC | PRN

## 2014-06-19 MED ORDER — DEXTROSE 5 % IV SOLN
2.0000 g | Freq: Once | INTRAVENOUS | Status: AC
  Administered 2014-06-19: 2 g via INTRAVENOUS
  Filled 2014-06-19: qty 2

## 2014-06-19 MED ORDER — SODIUM CHLORIDE 0.9 % IJ SOLN
3.0000 mL | Freq: Two times a day (BID) | INTRAMUSCULAR | Status: DC
  Administered 2014-06-21 – 2014-06-26 (×5): 3 mL via INTRAVENOUS

## 2014-06-19 MED ORDER — SODIUM CHLORIDE 0.9 % IV BOLUS (SEPSIS): 1500.0000 mL | INTRAVENOUS | Status: DC

## 2014-06-19 MED ORDER — HEPARIN SODIUM (PORCINE) 5000 UNIT/ML IJ SOLN
5000.0000 [IU] | Freq: Three times a day (TID) | INTRAMUSCULAR | Status: DC
  Administered 2014-06-20 – 2014-06-24 (×12): 5000 [IU] via SUBCUTANEOUS
  Filled 2014-06-19 (×15): qty 1

## 2014-06-19 MED ORDER — FLEET ENEMA 7-19 GM/118ML RE ENEM
1.0000 | ENEMA | Freq: Every day | RECTAL | Status: DC | PRN
  Filled 2014-06-19: qty 1

## 2014-06-19 MED ORDER — GUAIFENESIN-DM 100-10 MG/5ML PO SYRP
5.0000 mL | ORAL_SOLUTION | ORAL | Status: DC | PRN
  Filled 2014-06-19: qty 5

## 2014-06-19 MED ORDER — CALCIUM CARBONATE 1250 MG/5ML PO SUSP
1250.0000 mg | Freq: Three times a day (TID) | ORAL | Status: DC
  Administered 2014-06-20 – 2014-06-25 (×8): 1250 mg
  Filled 2014-06-19 (×22): qty 15

## 2014-06-19 MED ORDER — ACETAMINOPHEN 160 MG/5ML PO SOLN
15.0000 mg/kg | Freq: Once | ORAL | Status: AC
  Administered 2014-06-19: 851.2 mg via ORAL
  Filled 2014-06-19: qty 40.6

## 2014-06-19 MED ORDER — ONDANSETRON HCL 4 MG/2ML IJ SOLN: 4.0000 mg | Freq: Four times a day (QID) | INTRAMUSCULAR | Status: DC | PRN

## 2014-06-19 MED ORDER — DICYCLOMINE HCL 10 MG/5ML PO SOLN
10.0000 mg | Freq: Three times a day (TID) | ORAL | Status: DC
  Administered 2014-06-24 – 2014-06-25 (×4): 10 mg
  Filled 2014-06-19 (×25): qty 5

## 2014-06-19 MED ORDER — IPRATROPIUM-ALBUTEROL 0.5-2.5 (3) MG/3ML IN SOLN
3.0000 mL | RESPIRATORY_TRACT | Status: DC | PRN
  Administered 2014-06-20 – 2014-06-22 (×6): 3 mL via RESPIRATORY_TRACT
  Filled 2014-06-19 (×6): qty 3

## 2014-06-19 MED ORDER — SIMETHICONE 80 MG PO CHEW
80.0000 mg | CHEWABLE_TABLET | Freq: Three times a day (TID) | ORAL | Status: DC
  Administered 2014-06-20: 80 mg
  Filled 2014-06-19 (×9): qty 1

## 2014-06-19 MED ORDER — ADULT MULTIVITAMIN LIQUID CH
10.0000 mL | Freq: Every day | ORAL | Status: DC
  Administered 2014-06-24 – 2014-06-27 (×3): 10 mL
  Filled 2014-06-19 (×8): qty 10

## 2014-06-19 MED ORDER — FOLIC ACID 1 MG PO TABS
1.0000 mg | ORAL_TABLET | Freq: Every day | ORAL | Status: DC
  Administered 2014-06-25: 1 mg
  Filled 2014-06-19 (×10): qty 1

## 2014-06-19 MED ORDER — ACETAMINOPHEN 160 MG/5ML PO SOLN
650.0000 mg | Freq: Four times a day (QID) | ORAL | Status: DC | PRN
  Administered 2014-06-20 – 2014-06-23 (×7): 650 mg
  Filled 2014-06-19 (×7): qty 20.3

## 2014-06-19 MED ORDER — BACLOFEN 40000 MCG/20ML IT SOLN
385.2000 ug | INTRATHECAL | Status: DC
Start: 2014-06-20 — End: 2014-06-27

## 2014-06-19 MED ORDER — VANCOMYCIN HCL IN DEXTROSE 750-5 MG/150ML-% IV SOLN
750.0000 mg | Freq: Two times a day (BID) | INTRAVENOUS | Status: DC
  Administered 2014-06-20 – 2014-06-21 (×4): 750 mg via INTRAVENOUS
  Filled 2014-06-19 (×6): qty 150

## 2014-06-19 MED ORDER — PANTOPRAZOLE SODIUM 40 MG PO PACK
40.0000 mg | PACK | Freq: Two times a day (BID) | ORAL | Status: DC
  Administered 2014-06-20 – 2014-06-27 (×13): 40 mg
  Filled 2014-06-19 (×21): qty 20

## 2014-06-19 MED ORDER — FREE WATER
172.0000 mL | Freq: Three times a day (TID) | Status: DC
  Administered 2014-06-20 – 2014-06-26 (×2): 172 mL

## 2014-06-19 MED ORDER — MIDAZOLAM 5 MG/ML PEDIATRIC INJ FOR INTRANASAL/SUBLINGUAL USE: 10.0000 mg | Freq: Once | INTRAMUSCULAR | Status: AC | PRN

## 2014-06-19 MED ORDER — POTASSIUM CHLORIDE 20 MEQ/15ML (10%) PO SOLN
20.0000 meq | Freq: Every day | ORAL | Status: DC
  Administered 2014-06-20 – 2014-06-24 (×4): 20 meq
  Filled 2014-06-19 (×7): qty 15

## 2014-06-19 MED ORDER — IPRATROPIUM-ALBUTEROL 0.5-2.5 (3) MG/3ML IN SOLN
3.0000 mL | Freq: Once | RESPIRATORY_TRACT | Status: AC
  Administered 2014-06-19: 3 mL via RESPIRATORY_TRACT
  Filled 2014-06-19: qty 3

## 2014-06-19 MED ORDER — ZINC OXIDE 11.3 % EX CREA
1.0000 "application " | TOPICAL_CREAM | Freq: Every day | CUTANEOUS | Status: DC
  Administered 2014-06-20 – 2014-06-27 (×32): 1 via TOPICAL
  Filled 2014-06-19: qty 56

## 2014-06-19 MED ORDER — IBUPROFEN 100 MG/5ML PO SUSP
400.0000 mg | Freq: Four times a day (QID) | ORAL | Status: DC | PRN
  Filled 2014-06-19: qty 20

## 2014-06-19 MED ORDER — PSEUDOEPHEDRINE HCL 30 MG PO TABS
90.0000 mg | ORAL_TABLET | Freq: Three times a day (TID) | ORAL | Status: DC
  Filled 2014-06-19 (×5): qty 3

## 2014-06-19 MED ORDER — ONDANSETRON HCL 4 MG PO TABS: 4.0000 mg | ORAL_TABLET | Freq: Four times a day (QID) | ORAL | Status: DC | PRN

## 2014-06-19 MED ORDER — VITAMIN C 250 MG PO TABS
250.0000 mg | ORAL_TABLET | Freq: Every day | ORAL | Status: DC
  Administered 2014-06-25: 250 mg
  Filled 2014-06-19 (×10): qty 1

## 2014-06-19 MED ORDER — SUCRALFATE 1 GM/10ML PO SUSP
1.0000 g | Freq: Three times a day (TID) | ORAL | Status: DC
  Administered 2014-06-20 – 2014-06-27 (×24): 1 g
  Filled 2014-06-19 (×36): qty 10

## 2014-06-19 MED ORDER — MUPIROCIN 2 % EX OINT: 1.0000 "application " | TOPICAL_OINTMENT | CUTANEOUS | Status: DC | PRN

## 2014-06-19 MED ORDER — DIAZEPAM 10 MG RE GEL: 10.0000 mg | RECTAL | Status: DC | PRN

## 2014-06-19 MED ORDER — GABAPENTIN 250 MG/5ML PO SOLN
900.0000 mg | Freq: Three times a day (TID) | ORAL | Status: DC
  Administered 2014-06-20 – 2014-06-27 (×20): 900 mg
  Filled 2014-06-19 (×25): qty 18

## 2014-06-19 MED ORDER — SODIUM CHLORIDE 0.9 % IV SOLN
INTRAVENOUS | Status: DC
  Administered 2014-06-19: 23:00:00 via INTRAVENOUS

## 2014-06-19 MED ORDER — SODIUM CHLORIDE 0.9 % IV BOLUS (SEPSIS)
1000.0000 mL | INTRAVENOUS | Status: AC
  Administered 2014-06-19 (×2): 1000 mL via INTRAVENOUS

## 2014-06-19 MED ORDER — SODIUM CHLORIDE 0.9 % IV BOLUS (SEPSIS)
1000.0000 mL | INTRAVENOUS | Status: AC
  Administered 2014-06-19: 1000 mL via INTRAVENOUS

## 2014-06-19 MED ORDER — LEVALBUTEROL HCL 0.63 MG/3ML IN NEBU
0.6300 mg | INHALATION_SOLUTION | Freq: Four times a day (QID) | RESPIRATORY_TRACT | Status: DC
  Administered 2014-06-20 – 2014-06-27 (×26): 0.63 mg via RESPIRATORY_TRACT
  Filled 2014-06-19 (×50): qty 3

## 2014-06-19 MED ORDER — DEXTROSE 5 % IV SOLN
1.0000 g | Freq: Two times a day (BID) | INTRAVENOUS | Status: DC
  Administered 2014-06-20 – 2014-06-21 (×2): 1 g via INTRAVENOUS
  Filled 2014-06-19 (×7): qty 1

## 2014-06-19 MED ORDER — VANCOMYCIN HCL IN DEXTROSE 1-5 GM/200ML-% IV SOLN
1000.0000 mg | Freq: Once | INTRAVENOUS | Status: AC
  Administered 2014-06-19: 1000 mg via INTRAVENOUS
  Filled 2014-06-19: qty 200

## 2014-06-19 MED ORDER — CLOBAZAM 2.5 MG/ML PO SUSP
6.2500 mg | Freq: Three times a day (TID) | ORAL | Status: DC
  Administered 2014-06-20 – 2014-06-27 (×23): 6.25 mg
  Filled 2014-06-19 (×23): qty 4

## 2014-06-19 NOTE — Progress Notes (Addendum)
ANTIBIOTIC CONSULT NOTE - INITIAL  Pharmacy Consult for Vancomycin and Cefepime Indication: Sepsis/HCAP  Allergies  Allergen Reactions  . Vimpat [Lacosamide] Rash  . Adhesive [Tape]     Rips skin off  . Depakote [Divalproex Sodium]     Causes pancreatitis   . Keppra [Levetiracetam] Other (See Comments)    Bone marrow suppression  . Amoxicillin-Pot Clavulanate Rash    Give w fluconazole then no rash  . Neulasta [Pegfilgrastim] Palpitations    Patient Measurements: Weight: 125 lb (56.7 kg)  Vital Signs: Temp: 104.1 F (40.1 C) (05/18 1739) Temp Source: Rectal (05/18 1739) BP: 121/75 mmHg (05/18 1815) Pulse Rate: 135 (05/18 1815) Intake/Output from previous day:   Intake/Output from this shift:    Labs:  Recent Labs  06/19/14 1815  WBC 1.6*  HGB 7.7*  PLT 190  CREATININE 0.71   Estimated Creatinine Clearance: 79 mL/min (by C-G formula based on Cr of 0.71). No results for input(s): VANCOTROUGH, VANCOPEAK, VANCORANDOM, GENTTROUGH, GENTPEAK, GENTRANDOM, TOBRATROUGH, TOBRAPEAK, TOBRARND, AMIKACINPEAK, AMIKACINTROU, AMIKACIN in the last 72 hours.   Microbiology: No results found for this or any previous visit (from the past 720 hour(s)).  Medical History: Past Medical History  Diagnosis Date  . CP (cerebral palsy), spastic, quadriplegic   . Osteoporosis   . Undescended testes   . Seasonal allergies   . IVH (intraventricular hemorrhage) 1990-04-21    Grade IV  . Hip dislocation, bilateral   . Dysphagia   . Retinopathy of prematurity   . 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 Neuromuscular scoliosis   . Osteoporosis   . Complex partial seizures   . Generalized convulsive epilepsy without mention of intractable epilepsy   . Sinus bradycardia     HR drops to 38-40 while sleeping  . Blister of right heel     fluid filled; origin unknown  . Kidney stones     ?  Marland Kitchen Pneumonia      chronic pneumonia ,respitory failure dx Augest 2014  . Aspiration pneumonia      "chronic" (04/12/2014)  . OSA treated with BiPAP     "since age 66"   . Anemia   . 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 GERD (gastroesophageal reflux disease)   . Epilepsy   . Spastic quadriplegia     Medications:  Scheduled:   Infusions:  . ceFEPime (MAXIPIME) IV    . sodium chloride 1,000 mL (06/19/14 1828)  . vancomycin     Assessment: 23 yo M with CP presenting to ED on 06/19/2014 with fever since Saturday. Pharmacy has been consulted to dose cefepime and vancomycin for r/o sepsis. CXR with no acute disease noted. Tmax 104.1, WBC low at 1.6, Scr 0.71 (CrCl ~92 ml/min but may actually be lower 2/2 muscle wasting). Blood and urine cultures sent.    Goal of Therapy:  Vancomycin trough level 15-20 mcg/ml  Plan:  - Initiate vancomycin 1000 mg IV x 1, followed by 750 mg IV q12h - Initiate cefepime 2 gm IV x 1, followed by 1 gm IV q12h  - Monitor renal function/UOP, temp, WBC, C&S, VT at [facility] K. Velva Harman, PharmD, [place] Clinical Pharmacist - Resident Pager: [phone] Pharmacy: [phone] 06/19/2014 7:32 PM

## 2014-06-19 NOTE — ED Notes (Signed)
CBG 110. 

## 2014-06-19 NOTE — ED Notes (Signed)
Family at bedside. 

## 2014-06-19 NOTE — ED Notes (Addendum)
Patient with significant medical hx, here for fever since Saturday with home txs. Last dose of ibuprofen through G tube 1430 for fever of 101.4. Chest xray yesterday was negative for aspiration yesterday. HR 130s en route, BP WNL. Normal heart 100s per mother. Pt with Gtube and right portacath. Patient is full care at home. Patient on simple mask due to suctioning to right nare, usually on [place] at 2L.

## 2014-06-19 NOTE — ED Notes (Signed)
Mother is requesting pt. Receive his breathing treatment asap due to being past due for his normal daily treatment time. Nurse notified.

## 2014-06-19 NOTE — ED Provider Notes (Addendum)
CSN: [id number]     Arrival date & time 06/19/14  1729 History   First MD Initiated Contact with Patient 06/19/14 1759     Chief Complaint  Patient presents with  . Fever   level V caveat. Patient nonverbal. History is obtained from his legal guardian.   (Consider location/radiation/quality/duration/timing/severity/associated sxs/prior Treatment) HPI   Patient with fever 101.8 started 4 days ago. Treated with Tylenol and ibuprofen. Last dose 6 hours ago. Other symptoms include cough. Patient is nonverbal and noncommunicative receives nourishment through G-tube. Past Medical History  Diagnosis Date  . CP (cerebral palsy), spastic, quadriplegic   . Osteoporosis   . Undescended testes   . Seasonal allergies   . IVH (intraventricular hemorrhage) 1990/11/29    Grade IV  . Hip dislocation, bilateral   . Dysphagia   . Retinopathy of prematurity   . 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 Neuromuscular scoliosis   . Osteoporosis   . Complex partial seizures   . Generalized convulsive epilepsy without mention of intractable epilepsy   . Sinus bradycardia     HR drops to 38-40 while sleeping  . Blister of right heel     fluid filled; origin unknown  . Kidney stones     ?  Marland Kitchen Pneumonia      chronic pneumonia ,respitory failure dx Augest 2014  . Aspiration pneumonia     "chronic" (04/12/2014)  . OSA treated with BiPAP     "since age 74"   . Anemia   . 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 GERD (gastroesophageal reflux disease)   . Epilepsy   . Spastic quadriplegia    Past Surgical History  Procedure Laterality Date  . Mole removal  "several B/T 2008-2010"    "from all over"  . Jejunostomy feeding tube  03/08/2013; ~ 09/2013; ~ 01/2014    "transgastric-jujunal feeding tube"  . Gastrostomy tube placement  11./02/1998       . Button change  12/15/2010    Procedure: BUTTON CHANGE;  Surgeon: Gatha Mayer, MD;   Location: [facility];  Service: Endoscopy;  Laterality: N/A;  . Peg placement  10/07/2011    Procedure: PERCUTANEOUS ENDOSCOPIC GASTROSTOMY (PEG) REPLACEMENT;  Surgeon: Lafayette Dragon, MD;  Location: [facility];  Service: Endoscopy;  Laterality: N/A;  Needs 18 F 2.5 button ordered-dl  . Inguinal hernia repair Bilateral 1992  . Retinopathy of prematurity surgery  1992  . Achilles tendon lengthening Bilateral 12/1998  . Tendon release  12/1998    Soft tissue releases  wrists and fingers [Other]  . Infusion pump implantation  07/25/2000; 2013    Intrathecal baclofen pump  . Peg placement N/A 06/05/2012    Procedure: PERCUTANEOUS ENDOSCOPIC GASTROSTOMY (PEG) REPLACEMENT;  Surgeon: Lafayette Dragon, MD;  Location: [facility];  Service: Endoscopy;  Laterality: N/A;  button 45fr.2.5cm  . Strabismus surgery Bilateral 1993    "3 on right; 2 on left"  . Back surgery  ~ 2008    Harrington Rods in back needs to be log rolled  . Tendon repair Bilateral 09/05/2012    Procedure: LENGTHENING OF DIGITAL FLEXOR TENDONS BILTERAL HANDS;  Surgeon: Jolyn Nap, MD;  Location: [facility];  Service: Orthopedics;  Laterality: Bilateral;  . Peg placement N/A 09/13/2012    Procedure: PERCUTANEOUS ENDOSCOPIC GASTROSTOMY (PEG) REPLACEMENT;  Surgeon: Lafayette Dragon, MD;  Location: [facility];  Service: Endoscopy;  Laterality: N/A;  .  Flexible sigmoidoscopy N/A 10/30/2012    Procedure: FLEXIBLE SIGMOIDOSCOPY;  Surgeon: Cleotis Nipper, MD;  Location: [facility];  Service: Endoscopy;  Laterality: N/A;  . Peg placement N/A 11/15/2012    Procedure: PERCUTANEOUS ENDOSCOPIC GASTROSTOMY (PEG) REPLACEMENT;  Surgeon: Jeryl Columbia, MD;  Location: [facility];  Service: Endoscopy;  Laterality: N/A;  . Gastrostomy tube placement  11./02/1998  . Rectal biopsy  10/29/2012    S/P diarrhea from Vancomycin  . Portacath placement Right 11/25/2012    chest  . Eye surgery     Family History  Problem Relation Age of Onset  . Adopted: Yes    History  Substance Use Topics  . Smoking status: Never Smoker   . Smokeless tobacco: Never Used     Comment: never used tobacco  . Alcohol Use: No    Review of Systems  Unable to perform ROS Constitutional: Positive for fever.  Respiratory: Positive for cough.   Neurological:       Bedbound, nonverbal      Allergies  Vimpat; Adhesive; Depakote; Keppra; Amoxicillin-pot clavulanate; and Neulasta  Home Medications   Prior to Admission medications   Medication Sig Start Date End Date Taking? Authorizing Provider  Acetaminophen (TYLENOL PO) Place 650 mg into feeding tube every 6 (six) hours as needed (fever).    Historical Provider, MD  albuterol (PROVENTIL HFA;VENTOLIN HFA) 108 (90 BASE) MCG/ACT inhaler Inhale 2 puffs into the lungs every 4 (four) hours as needed for shortness of breath.    Historical Provider, MD  albuterol (PROVENTIL) (2.5 MG/3ML) 0.083% nebulizer solution Take 2.5 mg by nebulization. 1 ampule twice daily and as needed (making 4 doses daily) 10/11/12   Elsie Stain, MD  Ascorbic Acid (VITAMIN C) 500 MG/5ML LIQD 300 mg by PEG Tube route every morning.     Historical Provider, MD  baclofen (GABLOFEN) 40000 MCG/20ML SOLN 385.2 mcg by Intrathecal route continuous.  05/04/12   Jodi Geralds, MD  calcium carbonate, dosed in mg elemental calcium, 1250 MG/5ML 1,250 mg of elemental calcium by PEG Tube route 3 (three) times daily. 5 cc via peg tube    Historical Provider, MD  cloBAZam (ONFI) 2.5 MG/ML solution Place 2.5 mL in gastrostomy tube 3 times daily 05/17/14   Jodi Geralds, MD  DIASTAT ACUDIAL 20 MG GEL Place 12.5 mg rectally as needed. For seizure lasting 2 minutes or longer or repetitive seizures.  No more than 1 dose every 12 hours or nasal versed (not both). 04/01/14   Historical Provider, MD  dicyclomine (BENTYL) 10 MG/5ML syrup Place into feeding tube every 8 (eight) hours as needed. Not to exceed 5 doses in 1 wk    Historical Provider, MD  folic acid  (FOLVITE) 1 MG tablet 1 mg by Feeding Tube route daily.     Historical Provider, MD  furosemide (LASIX) 10 MG/ML solution Place 20 mg into feeding tube daily as needed for fluid.     Historical Provider, MD  gabapentin (NEURONTIN) 250 MG/5ML solution Take 18 mL 3 times daily Patient taking differently: Place 900 mg into feeding tube 3 (three) times daily.  05/01/14   Jodi Geralds, MD  ibuprofen (ADVIL,MOTRIN) 100 MG/5ML suspension Place 400 mg into feeding tube every 6 (six) hours as needed for fever (pain).    Historical Provider, MD  ketoconazole (NIZORAL) 2 % cream Apply 1 application topically 2 (two) times daily as needed for irritation (yeast rash from antibiotics).     Historical Provider, MD  lansoprazole (PREVACID SOLUTAB) 30 MG disintegrating tablet 30 mg by Feeding Tube route 2 (two) times daily. 730am 09/07/12   Lafayette Dragon, MD  midazolam (VERSED) 5 MG/ML injection Place 2 mLs (10 mg total) into the nose once. Draw up 36ml in 2 syringes. Remove blue vial access device. Attach syringe to nasal atomizer for intranasal administration. Give 76ml in right nostril x 2 for seizures lasting 2 minutes or longer or for repetitive seizures in a short period of time. 11/05/13   Rockwell Germany, NP  Multiple Vitamins-Minerals (MULTIVITAMIN) LIQD 10 mLs by Feeding Tube route daily.     Historical Provider, MD  Multiple Vitamins-Minerals (ZINC PO) 5 mLs by PEG Tube route daily. 244 mg / 5 mL zinc solution    Historical Provider, MD  mupirocin ointment (BACTROBAN) 2 % Apply 1 application topically as needed (to G-T site). 09/07/12   Lafayette Dragon, MD  Nutritional Supplements (PROMOD PO) 30 mLs by Feeding Tube route 2 (two) times daily. Noon and 10pm    Historical Provider, MD  Nutritional Supplements (TWOCAL HN) LIQD 237 mLs by Feeding Tube route. T.3 can at 46 cc hour x 12 hours - water 172 cc pre and post each and extra 172 bid.    Historical Provider, MD  OnabotulinumtoxinA (BOTOX IJ) Inject 300 Units  as directed See admin instructions. Every 3 months    Historical Provider, MD  OVER THE COUNTER MEDICATION Apply 1 application topically as needed (after diaper changes). Balmex and Bag Balm paste made and applied after diaper changes    Historical Provider, MD  OXYGEN-HELIUM IN Inhale into the lungs. Oxygen PRN to keep O2 Sat at 90%    Historical Provider, MD  potassium chloride (KLOR-CON) 20 MEQ packet 20 mEq by Feeding Tube route daily.     Historical Provider, MD  Pseudoephedrine HCl (SUDAFED CHILDRENS) 15 MG/5ML LIQD Give 90 mg by tube 3 (three) times daily.     Historical Provider, MD  simethicone (MYLICON) 664 MG chewable tablet 125 mg by Feeding Tube route every 6 (six) hours as needed for flatulence.     Historical Provider, MD  sodium phosphate (FLEET) enema Place 1 enema rectally. follow package directions PRN    Historical Provider, MD  sucralfate (CARAFATE) 1 GM/10ML suspension Place 1 g into feeding tube 4 (four) times daily. 7am, 130pm, 5pm, 8pm    Historical Provider, MD  Water For Irrigation, Sterile (FREE WATER) SOLN Place 172 mLs into feeding tube 4 (four) times daily.    Historical Provider, MD  zinc oxide (BALMEX) 11.3 % CREA cream Apply 1 application topically 5 (five) times daily. With each diaper change.    Historical Provider, MD   BP 123/75 mmHg  Pulse 136  Temp(Src) 104.1 F (40.1 C) (Rectal)  Resp 37  SpO2 100% Physical Exam  Constitutional:  Chronically ill-appearing small for age  HENT:  Head: Normocephalic and atraumatic.  Opens eyes to verbal stimulus  Eyes: EOM are normal.  Neck: Neck supple.  Cardiovascular: Normal heart sounds.   Tachycardic  Pulmonary/Chest:  Rales at bases bilaterally  Abdominal: Soft. Bowel sounds are normal.  Musculoskeletal:  Muscular atrophy all 4 extremities  Neurological:  Does not follow simple commands, nonverbal  Skin: Skin is warm and dry. No rash noted.  Nursing note and vitals reviewed.   ED Course  Procedures  (including critical care time) Labs Review Labs Reviewed - No data to display  Imaging Review No results found.   EKG Interpretation  Date/Time:  Wednesday Jun 19 2014 17:35:08 EDT Ventricular Rate:  139 PR Interval:  123 QRS Duration: 74 QT Interval:  283 QTC Calculation: 430 R Axis:   78 Text Interpretation:  Sinus tachycardia Ventricular premature complex Low  voltage, precordial leads RSR' in V1 or V2, probably normal variant No  significant change since last tracing Confirmed by Itzel Mckibbin  MD, Etha Stambaugh  [phone]) on 06/19/2014 5:43:22 PM     Patient resting comfortably after treatment with intravenous fluids, and antibiotics. Chest x-ray viewed by me. Type and screen ordered Results for orders placed or performed during the hospital encounter of 06/19/14  CBC WITH DIFFERENTIAL  Result Value Ref Range   WBC 1.6 (L) 4.0 - 10.5 K/uL   RBC 2.38 (L) 4.22 - 5.81 MIL/uL   Hemoglobin 7.7 (L) 13.0 - 17.0 g/dL   HCT 25.4 (L) 39.0 - 52.0 %   MCV 106.7 (H) 78.0 - 100.0 fL   MCH 32.4 26.0 - 34.0 pg   MCHC 30.3 30.0 - 36.0 g/dL   RDW 20.8 (H) 11.5 - 15.5 %   Platelets 190 150 - 400 K/uL   Neutrophils Relative % 41 (L) 43 - 77 %   Lymphocytes Relative 38 12 - 46 %   Monocytes Relative 18 (H) 3 - 12 %   Eosinophils Relative 1 0 - 5 %   Basophils Relative 2 (H) 0 - 1 %   Neutro Abs 0.7 (L) 1.7 - 7.7 K/uL   Lymphs Abs 0.6 (L) 0.7 - 4.0 K/uL   Monocytes Absolute 0.3 0.1 - 1.0 K/uL   Eosinophils Absolute 0.0 0.0 - 0.7 K/uL   Basophils Absolute 0.0 0.0 - 0.1 K/uL   RBC Morphology POLYCHROMASIA PRESENT    Smear Review LARGE PLATELETS PRESENT   Comprehensive metabolic panel  Result Value Ref Range   Sodium 144 135 - 145 mmol/L   Potassium 4.3 3.5 - 5.1 mmol/L   Chloride 104 101 - 111 mmol/L   CO2 31 22 - 32 mmol/L   Glucose, Bld 102 (H) 65 - 99 mg/dL   BUN 20 6 - 20 mg/dL   Creatinine, Ser 0.71 0.61 - 1.24 mg/dL   Calcium 8.8 (L) 8.9 - 10.3 mg/dL   Total Protein 6.3 (L) 6.5 - 8.1  g/dL   Albumin 3.2 (L) 3.5 - 5.0 g/dL   AST 23 15 - 41 U/L   ALT 38 17 - 63 U/L   Alkaline Phosphatase 82 38 - 126 U/L   Total Bilirubin 1.2 0.3 - 1.2 mg/dL   GFR calc non Af Amer >60 >60 mL/min   GFR calc Af Amer >60 >60 mL/min   Anion gap 9 5 - 15  Urinalysis, Routine w reflex microscopic  Result Value Ref Range   Color, Urine AMBER (A) YELLOW   APPearance CLEAR CLEAR   Specific Gravity, Urine 1.029 1.005 - 1.030   pH 6.0 5.0 - 8.0   Glucose, UA NEGATIVE NEGATIVE mg/dL   Hgb urine dipstick NEGATIVE NEGATIVE   Bilirubin Urine SMALL (A) NEGATIVE   Ketones, ur NEGATIVE NEGATIVE mg/dL   Protein, ur >300 (A) NEGATIVE mg/dL   Urobilinogen, UA 1.0 0.0 - 1.0 mg/dL   Nitrite NEGATIVE NEGATIVE   Leukocytes, UA NEGATIVE NEGATIVE  Urine microscopic-add on  Result Value Ref Range   Squamous Epithelial / LPF RARE RARE   RBC / HPF 3-6 <3 RBC/hpf   Bacteria, UA FEW (A) RARE   Casts GRANULAR CAST (A) NEGATIVE  I-Stat CG4 Lactic  Acid, ED  (not at [facility])  Result Value Ref Range   Lactic Acid, Venous 1.48 0.5 - 2.0 mmol/L  CBG monitoring, ED  Result Value Ref Range   Glucose-Capillary 118 (H) 65 - 99 mg/dL  I-Stat venous blood gas, ED  Result Value Ref Range   pH, Ven 7.300 7.250 - 7.300   pCO2, Ven 57.4 (H) 45.0 - 50.0 mmHg   pO2, Ven 26.0 (LL) 30.0 - 45.0 mmHg   Bicarbonate 28.2 (H) 20.0 - 24.0 mEq/L   TCO2 30 0 - 100 mmol/L   O2 Saturation 39.0 %   Sample type VENOUS    Comment NOTIFIED PHYSICIAN    Dg Chest Port 1 View  06/19/2014   CLINICAL DATA:  Fever since 06/15/2014.  EXAM: PORTABLE CHEST - 1 VIEW  COMPARISON:  Single view of the chest 05/11/2014 and 04/12/2014.  FINDINGS: Lung volumes are low as on the comparison examinations. Port-A-Cath is again seen. The lungs appear clear. Heart size is normal. No pneumothorax or pleural effusion. Spinal fusion hardware for scoliosis is again seen.  IMPRESSION: No acute disease.   Electronically Signed   By: Inge Rise M.D.   On:  06/19/2014 18:40    MDM  Code sepsis called based on fever, respiratory rate, area source likely pulmonary Final diagnoses:  None   Patient's CODE STATUS is currently that he can be intubated for rest for distress however in case of cardiac arrest he is DO NOT RESUSCITATE  Source of fever is unclear at this point. Spoke with Dr.Niu Plan Admit step down unit, nothing by mouth, cultures pending,Broad spectrum antibiotics Dx# 1 sepsis #2 leukopenia #3anemia   CRITICAL CARE Performed by: Orlie Dakin Total critical care time: 30 minute Critical care time was exclusive of separately billable procedures and treating other patients. Critical care was necessary to treat or prevent imminent or life-threatening deterioration. Critical care was time spent personally by me on the following activities: development of treatment plan with patient and/or surrogate as well as nursing, discussions with consultants, evaluation of patient's response to treatment, examination of patient, obtaining history from patient or surrogate, ordering and performing treatments and interventions, ordering and review of laboratory studies, ordering and review of radiographic studies, pulse oximetry and re-evaluation of patient's condition.  Orlie Dakin, MD 06/19/14 2248  Orlie Dakin, MD 06/19/14 2500  Orlie Dakin, MD 06/19/14 2248

## 2014-06-19 NOTE — ED Notes (Signed)
CBG 118 

## 2014-06-19 NOTE — ED Notes (Signed)
Mother at bedside to help with patient care

## 2014-06-19 NOTE — ED Notes (Addendum)
Pt has port a cath to be accessed by RN. Pt's mom at bedside

## 2014-06-19 NOTE — H&P (Signed)
[facility] History and Physical  Jeffery [name] QZE:[id number] DOB: 1990-10-01 DOA: 06/19/2014  Referring physician: ED physician PCP: Marijean Bravo, MD  Specialists:   Chief Complaint: fever and cough  HPI: Jeffery [name] is a 24 y.o. male with PMH of history of cerebral palsy, spastic quadriplegia, wheelchair mobile, seizure disorder, has Port-A-Cath, PEG tube, hx of pancytopenia attributed to [place] which was discontinued, OSA on BiPAP who presents with fever and cough.  Per patient's foster mother, patient has been having fever in the past 4 days. His temperature was 101.8 at one time point. Patient has mild cough with clear and thick sputum production. No dysuria, diarrhea, abdominal pain per caregiver. He was treated with Tylenol and ibuprofen by foster mother with some help.   In ED, patient was found to have WBC 1.6, negative urinalysis, lactate 1.48, temperature 104.1, tachycardia, electrolytes okay. chest x-ray is negative for acute abnormalities. Patient is admitted to inpatient for further evaluation treatment.  Where does patient live?   At home    Can patient participate in ADLs? none  Review of Systems: per foster mother  General: has fevers, chills, no changes in body weight, has fatigue HEENT: no blurry vision, hearing changes or sore throat Pulm: no dyspnea, has coughing, no wheezing CV: no chest pain, palpitations Abd: no nausea, vomiting, abdominal pain, diarrhea, constipation GU: no dysuria, burning on urination, increased urinary frequency, hematuria  Ext: no leg edema Neuro: no vision change or hearing loss Skin: no rash MSK: no limitation of range of movement in spin Heme: No easy bruising.  Travel history: No recent long distant travel.  Allergy:  Allergies  Allergen Reactions  . Vimpat [Lacosamide] Rash  . Adhesive [Tape]     Rips skin off  . Depakote [Divalproex Sodium]     Causes pancreatitis   . Keppra [Levetiracetam] Other (See  Comments)    Bone marrow suppression  . Amoxicillin-Pot Clavulanate Rash    Give w fluconazole then no rash  . Neulasta [Pegfilgrastim] Palpitations    Past Medical History  Diagnosis Date  . CP (cerebral palsy), spastic, quadriplegic   . Osteoporosis   . Undescended testes   . Seasonal allergies   . IVH (intraventricular hemorrhage) 03-06-1990    Grade IV  . Hip dislocation, bilateral   . Dysphagia   . Retinopathy of prematurity   . 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 Neuromuscular scoliosis   . Osteoporosis   . Complex partial seizures   . Generalized convulsive epilepsy without mention of intractable epilepsy   . Sinus bradycardia     HR drops to 38-40 while sleeping  . Blister of right heel     fluid filled; origin unknown  . Kidney stones     ?  Marland Kitchen Pneumonia      chronic pneumonia ,respitory failure dx Augest 2014  . Aspiration pneumonia     "chronic" (04/12/2014)  . OSA treated with BiPAP     "since age 17"   . Anemia   . 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 GERD (gastroesophageal reflux disease)   . Epilepsy   . Spastic quadriplegia     Past Surgical History  Procedure Laterality Date  . Mole removal  "several B/T 2008-2010"    "from all over"  . Jejunostomy feeding tube  03/08/2013; ~ 09/2013; ~ 01/2014    "transgastric-jujunal feeding tube"  . Gastrostomy tube placement  11./02/1998       . Button change  12/15/2010    Procedure: BUTTON CHANGE;  Surgeon: Gatha Mayer, MD;  Location: [facility];  Service: Endoscopy;  Laterality: N/A;  . Peg placement  10/07/2011    Procedure: PERCUTANEOUS ENDOSCOPIC GASTROSTOMY (PEG) REPLACEMENT;  Surgeon: Lafayette Dragon, MD;  Location: [facility];  Service: Endoscopy;  Laterality: N/A;  Needs 18 F 2.5 button ordered-dl  . Inguinal hernia repair Bilateral 1992  . Retinopathy of prematurity surgery  1992  . Achilles tendon lengthening Bilateral 12/1998  .  Tendon release  12/1998    Soft tissue releases  wrists and fingers [Other]  . Infusion pump implantation  07/25/2000; 2013    Intrathecal baclofen pump  . Peg placement N/A 06/05/2012    Procedure: PERCUTANEOUS ENDOSCOPIC GASTROSTOMY (PEG) REPLACEMENT;  Surgeon: Lafayette Dragon, MD;  Location: [facility];  Service: Endoscopy;  Laterality: N/A;  button 64fr.2.5cm  . Strabismus surgery Bilateral 1993    "3 on right; 2 on left"  . Back surgery  ~ 2008    Harrington Rods in back needs to be log rolled  . Tendon repair Bilateral 09/05/2012    Procedure: LENGTHENING OF DIGITAL FLEXOR TENDONS BILTERAL HANDS;  Surgeon: Jolyn Nap, MD;  Location: [facility];  Service: Orthopedics;  Laterality: Bilateral;  . Peg placement N/A 09/13/2012    Procedure: PERCUTANEOUS ENDOSCOPIC GASTROSTOMY (PEG) REPLACEMENT;  Surgeon: Lafayette Dragon, MD;  Location: [facility];  Service: Endoscopy;  Laterality: N/A;  . Flexible sigmoidoscopy N/A 10/30/2012    Procedure: FLEXIBLE SIGMOIDOSCOPY;  Surgeon: Cleotis Nipper, MD;  Location: [facility];  Service: Endoscopy;  Laterality: N/A;  . Peg placement N/A 11/15/2012    Procedure: PERCUTANEOUS ENDOSCOPIC GASTROSTOMY (PEG) REPLACEMENT;  Surgeon: Jeryl Columbia, MD;  Location: [facility];  Service: Endoscopy;  Laterality: N/A;  . Gastrostomy tube placement  11./02/1998  . Rectal biopsy  10/29/2012    S/P diarrhea from Vancomycin  . Portacath placement Right 11/25/2012    chest  . Eye surgery      Social History:  reports that he has never smoked. He has never used smokeless tobacco. He reports that he does not drink alcohol or use illicit drugs.  Family History:  Family History  Problem Relation Age of Onset  . Adopted: Yes     Prior to Admission medications   Medication Sig Start Date End Date Taking? Authorizing Provider  Acetaminophen (TYLENOL PO) Place 650 mg into feeding tube every 6 (six) hours as needed (fever).   Yes Historical Provider, MD  albuterol  (PROVENTIL HFA;VENTOLIN HFA) 108 (90 BASE) MCG/ACT inhaler Inhale 2 puffs into the lungs every 4 (four) hours as needed for shortness of breath.   Yes Historical Provider, MD  albuterol (PROVENTIL) (2.5 MG/3ML) 0.083% nebulizer solution Take 2.5 mg by nebulization 2 (two) times daily as needed. 1 ampule twice daily and as needed (making 4 doses daily). Take a 8am and 8pm and then prn 6 and 7 are not used at the same time 10/11/12  Yes Elsie Stain, MD  Ascorbic Acid (VITAMIN C) 500 MG/5ML LIQD 300 mg by PEG Tube route every morning.    Yes Historical Provider, MD  baclofen (GABLOFEN) 40000 MCG/20ML SOLN 385.2 mcg by Intrathecal route continuous.  05/04/12  Yes Jodi Geralds, MD  calcium carbonate, dosed in mg elemental calcium, 1250 MG/5ML 1,250 mg of elemental calcium by PEG Tube route 3 (three) times daily. 5 cc via peg tube.  Give at 8am , 2pm, and 8pm   Yes Historical Provider, MD  cloBAZam (ONFI) 2.5 MG/ML solution Place 2.5 mL in gastrostomy tube 3 times daily 05/17/14  Yes Jodi Geralds, MD  DIASTAT ACUDIAL 20 MG GEL Place 12.5 mg rectally as needed. For seizure lasting 2 minutes or longer or repetitive seizures.  No more than 1 dose every 12 hours or nasal versed (not both). 04/01/14  Yes Historical Provider, MD  dicyclomine (BENTYL) 10 MG/5ML syrup Place 10 mg into feeding tube every 8 (eight) hours as needed. Not to exceed 5 doses in 1 wk as needed for Abd cramps   Yes Historical Provider, MD  folic acid (FOLVITE) 1 MG tablet 1 mg by Feeding Tube route daily. 8:00am per G tube   Yes Historical Provider, MD  furosemide (LASIX) 10 MG/ML solution Place 20 mg into feeding tube daily as needed for fluid.    Yes Historical Provider, MD  gabapentin (NEURONTIN) 250 MG/5ML solution Take 18 mL 3 times daily Patient taking differently: Place 900 mg into feeding tube 3 (three) times daily. Give 17mls tid at 8am, 2pm and 8pm 05/01/14  Yes Jodi Geralds, MD  ibuprofen (ADVIL,MOTRIN) 100 MG/5ML  suspension Place 400 mg into feeding tube every 6 (six) hours as needed for fever (pain).   Yes Historical Provider, MD  ketoconazole (NIZORAL) 2 % cream Apply 1 application topically 2 (two) times daily as needed for irritation (yeast rash from antibiotics).    Yes Historical Provider, MD  lansoprazole (PREVACID SOLUTAB) 30 MG disintegrating tablet 30 mg by Feeding Tube route 2 (two) times daily. 7:30am and 7:30pm 09/07/12  Yes Lafayette Dragon, MD  midazolam (VERSED) 5 MG/ML injection Place 2 mLs (10 mg total) into the nose once. Draw up 70ml in 2 syringes. Remove blue vial access device. Attach syringe to nasal atomizer for intranasal administration. Give 64ml in right nostril x 2 for seizures lasting 2 minutes or longer or for repetitive seizures in a short period of time. 11/05/13  Yes Rockwell Germany, NP  Multiple Vitamins-Minerals (MULTIVITAMIN) LIQD 10 mLs by Feeding Tube route daily.    Yes Historical Provider, MD  Multiple Vitamins-Minerals (ZINC PO) 5 mLs by PEG Tube route daily. 244 mg / 5 mL zinc solution   Yes Historical Provider, MD  mupirocin ointment (BACTROBAN) 2 % Apply 1 application topically as needed (to G-T site). 09/07/12  Yes Lafayette Dragon, MD  Nutritional Supplements (PROMOD PO) 30 mLs by Feeding Tube route 2 (two) times daily. Give at 10am and 5pm per  g tube   Yes Historical Provider, MD  Nutritional Supplements (TWOCAL HN) LIQD 237 mLs by Feeding Tube route. T.3 can at 46 cc hour x 12 hours - water 172 cc pre and post each and extra 172 bid.   Yes Historical Provider, MD  OnabotulinumtoxinA (BOTOX IJ) Inject 300 Units as directed See admin instructions. Every 3 months   Yes Historical Provider, MD  OVER THE COUNTER MEDICATION Apply 1 application topically as needed (after diaper changes). Balmex and Bag Balm paste made and applied after diaper changes   Yes Historical Provider, MD  OXYGEN-HELIUM IN Inhale into the lungs. Oxygen PRN to keep O2 Sat at 90%   Yes Historical Provider, MD   potassium chloride (KLOR-CON) 20 MEQ packet 20 mEq by Feeding Tube route daily.    Yes Historical Provider, MD  Pseudoephedrine HCl (SUDAFED CHILDRENS) 15 MG/5ML LIQD Give 90 mg by tube 3 (three) times daily.  Yes Historical Provider, MD  simethicone (MYLICON) 846 MG chewable tablet 125 mg by Feeding Tube route every 6 (six) hours as needed for flatulence.    Yes Historical Provider, MD  sodium phosphate (FLEET) enema Place 1 enema rectally daily as needed. follow package directions PRN. Help get pass gas   Yes Historical Provider, MD  sucralfate (CARAFATE) 1 GM/10ML suspension Place 1 g into feeding tube 4 (four) times daily. 7am, 130pm, 5pm, 8pm   Alone X 75min   Yes Historical Provider, MD  Water For Irrigation, Sterile (FREE WATER) SOLN Place 172 mLs into feeding tube 4 (four) times daily.   Yes Historical Provider, MD  zinc oxide (BALMEX) 11.3 % CREA cream Apply 1 application topically 5 (five) times daily. With each diaper change.   Yes Historical Provider, MD    Physical Exam: Filed Vitals:   06/19/14 2115 06/19/14 2130 06/19/14 2145 06/19/14 2200  BP: 103/58 112/89 105/66 119/63  Pulse: 137 141 138 139  Temp:      TempSrc:      Resp: 28 26 57 35  Weight:      SpO2: 98% 96% 100% 100%   General exam: not in acute distress Head, eyes and ENT: Nontraumatic and normocephalic. Pupils equally reacting to light and accommodation. Oral mucosa dry. Patient not fully cooperative with oral cavity and eye exam. Neck: Supple. No JVD, carotid bruit or thyromegaly. Lymphatics: No lymphadenopathy. Respiratory system:  rhonchi bilaterally. No rales. Porta cath right upper anterior chest: Site looks clean and dry without evidence of infection. Cardiovascular system: S1 and S2 heard, regular tachycardic. No JVD, murmurs, gallops, clicks or pedal edema. Telemetry: Sinus tachycardia in the 120s. Gastrointestinal system: Abdomen is nondistended, soft and nontender. Normal bowel sounds heard. No  organomegaly or masses appreciated. PEG tube site intact without evidence of infection. Central nervous system: Alert, looks around, nonverbal and does not follow instructions/not oriented. No obvious cranial nerve deficits. Extremities: All extremities with decreased bulk, spastic with contractures of hands and feet. Surgical scars on both forearms and legs from contracture release surgery. Peripheral pulses symmetrically felt.   Skin: No rashes or acute findings. Musculoskeletal system: Negative exam. Psychiatry: Pleasant and cooperative.  Labs on Admission:  Basic Metabolic Panel:  Recent Labs Lab 06/19/14 1815  NA 144  K 4.3  CL 104  CO2 31  GLUCOSE 102*  BUN 20  CREATININE 0.71  CALCIUM 8.8*   Liver Function Tests:  Recent Labs Lab 06/19/14 1815  AST 23  ALT 38  ALKPHOS 82  BILITOT 1.2  PROT 6.3*  ALBUMIN 3.2*   No results for input(s): LIPASE, AMYLASE in the last 168 hours. No results for input(s): AMMONIA in the last 168 hours. CBC:  Recent Labs Lab 06/19/14 1815  WBC 1.6*  NEUTROABS 0.7*  HGB 7.7*  HCT 25.4*  MCV 106.7*  PLT 190   Cardiac Enzymes: No results for input(s): CKTOTAL, CKMB, CKMBINDEX, TROPONINI in the last 168 hours.  BNP (last 3 results) No results for input(s): BNP in the last 8760 hours.  ProBNP (last 3 results) No results for input(s): PROBNP in the last 8760 hours.  CBG:  Recent Labs Lab 06/19/14 2043  GLUCAP 118*    Radiological Exams on Admission: Dg Chest Port 1 View  06/19/2014   CLINICAL DATA:  Fever since 06/15/2014.  EXAM: PORTABLE CHEST - 1 VIEW  COMPARISON:  Single view of the chest 05/11/2014 and 04/12/2014.  FINDINGS: Lung volumes are low as on the comparison examinations.  Port-A-Cath is again seen. The lungs appear clear. Heart size is normal. No pneumothorax or pleural effusion. Spinal fusion hardware for scoliosis is again seen.  IMPRESSION: No acute disease.   Electronically Signed   By: Inge Rise M.D.    On: 06/19/2014 18:40    EKG: Independently reviewed.  Abnormal findings: Tarchycardia.   Assessment/Plan Principal Problem:   Sepsis Active Problems:   Congenital spastic quadriplegia   Dysphagia   Localization-related symptomatic epilepsy and epileptic syndromes with complex partial seizures, intractable, without status epilepticus   Generalized convulsive epilepsy   Chronic respiratory failure   Pancytopenia   Neutropenic fever   Fever   OSA treated with BiPAP  Fever and sepsis: Source of infection is not clear. Patient has WBC 1.6, absolute neutrophil 656. Patient is septic on admission with fever, tachycardia and WBC 1.6. Hemodynamically stable. Urinalysis negative for UTI. Only clue for the source of infection is cough. But chest X ray is negative for acute abnormalities. Another DD is side effect of his anti-seizure medication, Onfi, but he has been on this med for a while and did not have problem.  - will admit to SDU - start Vancomycin and cefepime given WBC=1.6 - will get Procalcitonin and trend lactic acid level per sepsis protocol - IVF: 3L of NS bolus in ED, followed by 125 cc/h - Mucinex for cough  - Xopenex Neb prn for SOB - Urine legionella and S. pneumococcal antigen - Follow up blood culture x2, sputum culture and respiratory virus panel  Pancytopenia: This was noticed in early February. Hematology, Dr. Marin Olp following and felt that this was related to [place] which was discontinued. Peripheral smear was unremarkable. He received neupogen in the past. hgb is 7.4--> 7.7, stable.  -follow up by CBC, transfuse as needed  Sizure disorder: Patient is allergic to several common seizure medications. Currently he is on Onfi and prn Diastat Acudial -will continue home medications  OSA:   -Continue nightly BIPAP  History of cerebral palsy/congenital spastic quadriplegia/baclofen pump: -continue Baclofen pump  Dysphagia,s/p PEG tube:   -Nutritionist consulted for  management.  DVT ppx: SQ Heparin     Code Status: Partial (OK with intubation, No CPR) Family Communication: Yes, patient's  Care giver     at bed side Disposition Plan: Admit to inpatient   Date of Service 06/19/2014    Ivor Costa [facility] Pager [phone]  If 7PM-7AM, please contact night-coverage [email or web address] Password [facility] 06/19/2014, 10:19 PM

## 2014-06-19 NOTE — Telephone Encounter (Signed)
Received VM from pt's mother, Jeffery [name] reporting that pt's temperature this am was found to be 104 degrees. Reports she contacted MD (Dr Joya Gaskins? Name difficult to understand on VM) who advised pt be transported to [facility] ED. [name] requesting [name] be made aware and be "kept in the loop." Jeffery [name] also voices frustration on VM of "having to explain all the time why Jeffery [name] doesn't need a bone marrow biopsy."   [name] notified and will be involved in patient's care should his assistance be requested.

## 2014-06-20 DIAGNOSIS — J9621 Acute and chronic respiratory failure with hypoxia: Secondary | ICD-10-CM | POA: Diagnosis present

## 2014-06-20 DIAGNOSIS — G4733 Obstructive sleep apnea (adult) (pediatric): Secondary | ICD-10-CM

## 2014-06-20 DIAGNOSIS — D709 Neutropenia, unspecified: Secondary | ICD-10-CM

## 2014-06-20 DIAGNOSIS — G40309 Generalized idiopathic epilepsy and epileptic syndromes, not intractable, without status epilepticus: Secondary | ICD-10-CM

## 2014-06-20 DIAGNOSIS — IMO0001 Reserved for inherently not codable concepts without codable children: Secondary | ICD-10-CM | POA: Diagnosis present

## 2014-06-20 DIAGNOSIS — G809 Cerebral palsy, unspecified: Secondary | ICD-10-CM | POA: Diagnosis present

## 2014-06-20 LAB — GLUCOSE, CAPILLARY
Glucose-Capillary: 101 mg/dL — ABNORMAL HIGH (ref 65–99)
Glucose-Capillary: 112 mg/dL — ABNORMAL HIGH (ref 65–99)
Glucose-Capillary: 96 mg/dL (ref 65–99)
Glucose-Capillary: 92 mg/dL (ref 65–99)

## 2014-06-20 LAB — COMPREHENSIVE METABOLIC PANEL
ALBUMIN: 2.7 g/dL — AB (ref 3.5–5.0)
ALK PHOS: 66 U/L (ref 38–126)
ALT: 30 U/L (ref 17–63)
AST: 17 U/L (ref 15–41)
Anion gap: 7 (ref 5–15)
BUN: 18 mg/dL (ref 6–20)
CHLORIDE: 109 mmol/L (ref 101–111)
CO2: 28 mmol/L (ref 22–32)
Calcium: 7.6 mg/dL — ABNORMAL LOW (ref 8.9–10.3)
Creatinine, Ser: 0.62 mg/dL (ref 0.61–1.24)
GFR calc Af Amer: >60 mL/min (ref >60)
GFR calc non Af Amer: >60 mL/min (ref >60)
Glucose, Bld: 99 mg/dL (ref 65–99)
POTASSIUM: 3.9 mmol/L (ref 3.5–5.1)
SODIUM: 144 mmol/L (ref 135–145)
Total Bilirubin: 1.1 mg/dL (ref 0.3–1.2)
Total Protein: 5.1 g/dL — ABNORMAL LOW (ref 6.5–8.1)

## 2014-06-20 LAB — CBC
HEMATOCRIT: 20.4 % — AB (ref 39.0–52.0)
HEMATOCRIT: 22.7 % — AB (ref 39.0–52.0)
Hemoglobin: 6.4 g/dL — CL (ref 13.0–17.0)
Hemoglobin: 7.1 g/dL — ABNORMAL LOW (ref 13.0–17.0)
MCH: 32.6 pg (ref 26.0–34.0)
MCH: 31.8 pg (ref 26.0–34.0)
MCHC: 30.4 g/dL (ref 30.0–36.0)
MCHC: 31.3 g/dL (ref 30.0–36.0)
MCV: 107.4 fL — AB (ref 78.0–100.0)
MCV: 101.8 fL — AB (ref 78.0–100.0)
PLATELETS: 174 10*3/uL (ref 150–400)
Platelets: 126 10*3/uL — ABNORMAL LOW (ref 150–400)
RBC: 1.9 MIL/uL — AB (ref 4.22–5.81)
RBC: 2.23 MIL/uL — ABNORMAL LOW (ref 4.22–5.81)
RDW: 21 % — AB (ref 11.5–15.5)
RDW: 21.8 % — ABNORMAL HIGH (ref 11.5–15.5)
WBC: 1.4 10*3/uL — CL (ref 4.0–10.5)
WBC: 1.2 10*3/uL — CL (ref 4.0–10.5)

## 2014-06-20 LAB — MRSA PCR SCREENING: MRSA by PCR: NEGATIVE

## 2014-06-20 LAB — URINE CULTURE
CULTURE: NO GROWTH
Colony Count: NO GROWTH

## 2014-06-20 LAB — PREPARE RBC (CROSSMATCH)

## 2014-06-20 LAB — STREP PNEUMONIAE URINARY ANTIGEN: Strep Pneumo Urinary Antigen: NEGATIVE

## 2014-06-20 MED ORDER — SODIUM CHLORIDE 0.9 % IV SOLN: Freq: Once | INTRAVENOUS | Status: DC

## 2014-06-20 MED ORDER — PSEUDOEPHEDRINE HCL 30 MG/5ML PO SYRP
90.0000 mg | ORAL_SOLUTION | Freq: Three times a day (TID) | ORAL | Status: DC
  Administered 2014-06-20 – 2014-06-27 (×15): 90 mg
  Filled 2014-06-20 (×28): qty 15

## 2014-06-20 MED ORDER — ALBUTEROL SULFATE (2.5 MG/3ML) 0.083% IN NEBU
INHALATION_SOLUTION | RESPIRATORY_TRACT | Status: AC
  Administered 2014-06-20: 2.5 mg
  Filled 2014-06-20: qty 6

## 2014-06-20 MED ORDER — DEXTROSE-NACL 5-0.9 % IV SOLN
INTRAVENOUS | Status: DC
  Administered 2014-06-20: 125 mL/h via INTRAVENOUS
  Administered 2014-06-22: 06:00:00 via INTRAVENOUS

## 2014-06-20 NOTE — Telephone Encounter (Signed)
[name] called back today and said that Jeffery [name] was admitted to the hospital yesterday for temperature of 104. Mom wants Dr Gaynell Face to review his hospital records and be aware of what is going on with him. Mom can be reached at [phone]. She said no need for call back unless you want to talk to her. TG

## 2014-06-20 NOTE — Progress Notes (Signed)
Pt's caregiver requesting we give his sulcrafate at 9:30 and wait 30 minutes to give following medications. Also requesting each mediation given 30 minutes apart.

## 2014-06-20 NOTE — Progress Notes (Signed)
[facility] [place] TEAM 1 - Stepdown/ICU TEAM Progress Note  Jeffery [name] [id number]:[id number] DOB: [birth year] DOA: 06/19/2014 PCP: Marijean Bravo, MD  Admit HPI / Brief Narrative: Jeffery [name] is a 24 y.o. WM PMHx  cerebral palsy, spastic quadriplegia, wheelchair mobile, seizure disorder, has Port-A-Cath, PEG tube, hx of pancytopenia attributed to [place] which was discontinued, OSA on BiPAP who presents with fever and cough.  Per patient's foster mother, patient has been having fever in the past 4 days. His temperature was 101.8 at one time point. Patient has mild cough with clear and thick sputum production. No dysuria, diarrhea, abdominal pain per caregiver. He was treated with Tylenol and ibuprofen by foster mother with some help.   In ED, patient was found to have WBC 1.6, negative urinalysis, lactate 1.48, temperature 104.1, tachycardia, electrolytes okay. chest x-ray is negative for acute abnormalities. Patient is admitted to inpatient for further evaluation treatment.  HPI/Subjective: 5/19 A/O 0, does withdraw to pain. Per mother patient interactive when feeling well. Is nonverbal however able to manipulate computer games and smiles and offers his hand.  Assessment/Plan: Fever and sepsis due to unspecified organism/neutropenic fever:  -Source of infection is not clear. Patient has WBC 1.6, absolute neutrophil 656.  - Continue D5W-0.9% saline 141ml/hr  - Continue Vancomycin and cefepime given WBC=1.6 - will get Procalcitonin and trend lactic acid level per sepsis protocol - Mucinex for cough  - Xopenex Neb QID  -DuoNeb q 4hr PRN - Urine legionella and S. pneumococcal antigen - Follow up blood culture x2, sputum culture and respiratory virus panel -Blood culture from Port-A-Cath pending  Acute on chronic respiratory failure -See sepsis due to unspecified organism -Titrate O2 to maintain SPO2> 93%  Pancytopenia:  -This was noticed in early February. Hematology, Dr. Marin Olp  following and felt that this was related to [place] which was discontinued. -Received neupogen in the past. hgb is 7.4--> 7.7, stable.  -follow up by CBC, transfuse as needed -Clobazam (Onfi) can cause,    -5/19 Transfuse 1 unit PRBC -Consult Hematology, Dr. Marin Olp in a.m.  Sizure disorder:  -Patient is allergic to several common seizure medications.  -Currently he is on Clobazam (Onfi) 6.25 mg TID  -PRN Diastat Acudial  OSA:  -Continue nightly BIPAP  Cerebral palsy/congenital spastic quadriplegia/baclofen pump: -continue Baclofen pump  Dysphagia,s/p PEG tube:  -Nutritionist consulted for management.  Quadriplegia -KinAir bed, ensure on rotation    Code Status: FULL Family Communication: no family present at time of exam Disposition Plan: Resolution sepsis    Consultants:   Procedure/Significant Events:    Culture   Antibiotics: Cefepime 5/19>> Vancomycin 5/18>>   DVT prophylaxis: Heparin subcutaneous   Devices    LINES / TUBES:      Continuous Infusions: . baclofen    . dextrose 5 % and 0.9% NaCl 125 mL/hr (06/20/14 1036)    Objective: VITAL SIGNS: Temp: 103.3 F (39.6 C) (05/19 2023) Temp Source: Axillary (05/19 2023) BP: 105/58 mmHg (05/19 2023) Pulse Rate: 88 (05/19 2023) SPO2; FIO2:   Intake/Output Summary (Last 24 hours) at 06/20/14 2102 Last data filed at 06/20/14 1842  Gross per 24 hour  Intake   1310 ml  Output      0 ml  Net   1310 ml     Exam: General: A/O 0, No acute respiratory distress Lungs: Clear to auscultation bilaterally without wheezes or crackles Cardiovascular: Regular rate and rhythm without murmur gallop or rub normal S1 and S2 Abdomen: Nontender, nondistended, soft, bowel  sounds positive, no rebound, no ascites, no appreciable mass Extremities: No significant cyanosis, clubbing, or edema bilateral lower extremities  Data Reviewed: Basic Metabolic Panel:  Recent Labs Lab 06/19/14 1815  06/19/14 2305  NA 144 144  K 4.3 3.9  CL 104 109  CO2 31 28  GLUCOSE 102* 99  BUN 20 18  CREATININE 0.71 0.62  CALCIUM 8.8* 7.6*   Liver Function Tests:  Recent Labs Lab 06/19/14 1815 06/19/14 2305  AST 23 17  ALT 38 30  ALKPHOS 82 66  BILITOT 1.2 1.1  PROT 6.3* 5.1*  ALBUMIN 3.2* 2.7*   No results for input(s): LIPASE, AMYLASE in the last 168 hours. No results for input(s): AMMONIA in the last 168 hours. CBC:  Recent Labs Lab 06/19/14 1815 06/19/14 2305 06/20/14 0900  WBC 1.6* 1.4* 1.2*  NEUTROABS 0.7*  --   --   HGB 7.7* 6.4* 7.1*  HCT 25.4* 20.4* 22.7*  MCV 106.7* 107.4* 101.8*  PLT 190 174 126*   Cardiac Enzymes: No results for input(s): CKTOTAL, CKMB, CKMBINDEX, TROPONINI in the last 168 hours. BNP (last 3 results) No results for input(s): BNP in the last 8760 hours.  ProBNP (last 3 results) No results for input(s): PROBNP in the last 8760 hours.  CBG:  Recent Labs Lab 06/19/14 2043 06/19/14 2303 06/20/14 1002 06/20/14 1137 06/20/14 1546  GLUCAP 118* 110* 101* 112* 96    Recent Results (from the past 240 hour(s))  MRSA PCR Screening     Status: None   Collection Time: 06/19/14 11:48 PM  Result Value Ref Range Status   MRSA by PCR NEGATIVE NEGATIVE Final    Comment:        The GeneXpert MRSA Assay (FDA approved for NASAL specimens only), is one component of a comprehensive MRSA colonization surveillance program. It is not intended to diagnose MRSA infection nor to guide or monitor treatment for MRSA infections.      Studies:  Recent x-ray studies have been reviewed in detail by the Attending Physician  Scheduled Meds:  Scheduled Meds: . sodium chloride   Intravenous Once  . sodium chloride   Intravenous Once  . calcium carbonate (dosed in mg elemental calcium)  1,250 mg of elemental calcium Per Tube TID  . ceFEPime (MAXIPIME) IV  1 g Intravenous Q12H  . cloBAZam  6.25 mg Per Tube TID  . dicyclomine  10 mg Per Tube TID AC   . folic acid  1 mg Per Tube Daily  . free water  172 mL Per Tube TID AC & HS  . gabapentin  900 mg Per Tube TID  . heparin  5,000 Units Subcutaneous 3 times per day  . levalbuterol  0.63 mg Nebulization Q6H  . multivitamin  10 mL Per Tube Daily  . pantoprazole sodium  40 mg Per Tube BID  . potassium chloride  20 mEq Per Tube Daily  . pseudoephedrine  90 mg Per Tube 3 times per day  . simethicone  80 mg Per Tube TID AC & HS  . sodium chloride  3 mL Intravenous Q12H  . sucralfate  1 g Per Tube TID AC & HS  . vancomycin  750 mg Intravenous Q12H  . vitamin C  250 mg Per Tube Daily  . zinc oxide  1 application Topical 5 X Daily    Time spent on care of this patient: 40 mins   Shanikka Wonders, Geraldo Docker , MD  [facility] Office  [phone] Pager - [phone]  On-Call/Text Page:      [email or web address]      password [email or web address]  If 7PM-7AM, please contact night-coverage [email or web address] Password [email or web address] 06/20/2014, 9:02 PM   LOS: 1 day   Care during the described time interval was provided by me .  I have reviewed this patient's available data, including medical history, events of note, physical examination, radiology studies and test results as part of my evaluation  Dia Crawford, MD [phone] Pager

## 2014-06-20 NOTE — Progress Notes (Signed)
UR COMPLETED  

## 2014-06-20 NOTE — Progress Notes (Signed)
Paged MD woods and notified him of pts temp.  Will continue to monitor.

## 2014-06-20 NOTE — Telephone Encounter (Signed)
I reviewed the entire record and note that the patient is being treated for sepsis without an obvious source.  He has leukopenia.  This appears to be a medical problem.  In my opinion it is being well handled.  I did not call mother back.

## 2014-06-20 NOTE — Progress Notes (Signed)
   06/20/14 0055  BiPAP/CPAP/SIPAP  BiPAP/CPAP/SIPAP Pt Type Adult  Mask Type Full face mask  Mask Size Medium  Set Rate 0 breaths/min  Respiratory Rate 29 breaths/min  IPAP 12 cmH20  EPAP 7 cmH2O  Oxygen Percent 40 %  Flow Rate 6 lpm  BiPAP/CPAP/SIPAP BiPAP  Patient Home Equipment No  Auto Titrate No  BiPAP/CPAP /SiPAP Vitals  Pulse Rate (!) 127  Resp (!) 29  SpO2 99 %  Patient placed on BIPAP per home settings. He tolerates it very well at this time.

## 2014-06-20 NOTE — Care Management Note (Addendum)
Case Management Note  Patient Details  Name: Jeffery [name] MRN: [id number] Date of Birth: [birth year]  Subjective/Objective:       Pt from home with legal guardian [name] ,[phone] , admitted with sepsis.              PMH of history of cerebral palsy, spastic quadriplegia, wheelchair mobile, seizure disorder, has Port-A-Cath, PEG tube,  OSA on BiPAP who presents with fever and cough.  Action/Plan: Return to home with [facility] when medically stable. CM to f/u with d/c needs.  Expected Discharge Date:                  Expected Discharge Plan:  [place]  In-House Referral:     Discharge planning Services  CM Consult  Post Acute Care Choice:    Choice offered to:     DME Arranged:    DME Agency:     HH Arranged:    [place] Agency:     Status of Service:  In process, will continue to follow  Medicare Important Message Given:    Date Medicare IM Given:    Medicare IM give by:    Date Additional Medicare IM Given:    Additional Medicare Important Message give by:     If discussed at [place] of Stay Meetings, dates discussed:   06/25/14 Additional Comments: Pt with existing relationship with [facility], will need resumption orders for [facility]. Whitman Hero [place], RN 06/20/2014, 3:43 PM

## 2014-06-21 ENCOUNTER — Other Ambulatory Visit: Payer: Medicaid Other

## 2014-06-21 ENCOUNTER — Inpatient Hospital Stay (HOSPITAL_COMMUNITY): Payer: Medicaid Other

## 2014-06-21 ENCOUNTER — Ambulatory Visit: Payer: Medicaid Other

## 2014-06-21 ENCOUNTER — Ambulatory Visit: Payer: Medicaid Other | Admitting: Hematology & Oncology

## 2014-06-21 DIAGNOSIS — G40909 Epilepsy, unspecified, not intractable, without status epilepticus: Secondary | ICD-10-CM | POA: Diagnosis present

## 2014-06-21 DIAGNOSIS — R509 Fever, unspecified: Secondary | ICD-10-CM

## 2014-06-21 DIAGNOSIS — K567 Ileus, unspecified: Secondary | ICD-10-CM | POA: Diagnosis present

## 2014-06-21 DIAGNOSIS — G4733 Obstructive sleep apnea (adult) (pediatric): Secondary | ICD-10-CM | POA: Diagnosis present

## 2014-06-21 DIAGNOSIS — G8250 Quadriplegia, unspecified: Secondary | ICD-10-CM

## 2014-06-21 LAB — TYPE AND SCREEN
ABO/RH(D): O POS
ANTIBODY SCREEN: NEGATIVE
UNIT DIVISION: 0
UNIT DIVISION: 0

## 2014-06-21 LAB — GLUCOSE, CAPILLARY
GLUCOSE-CAPILLARY: 112 mg/dL — AB (ref 65–99)
GLUCOSE-CAPILLARY: 133 mg/dL — AB (ref 65–99)
GLUCOSE-CAPILLARY: 72 mg/dL (ref 65–99)
GLUCOSE-CAPILLARY: 77 mg/dL (ref 65–99)
Glucose-Capillary: 135 mg/dL — ABNORMAL HIGH (ref 65–99)
Glucose-Capillary: 95 mg/dL (ref 65–99)
Glucose-Capillary: 64 mg/dL — ABNORMAL LOW (ref 65–99)
Glucose-Capillary: 159 mg/dL — ABNORMAL HIGH (ref 65–99)
Glucose-Capillary: 108 mg/dL — ABNORMAL HIGH (ref 65–99)

## 2014-06-21 LAB — CBC WITH DIFFERENTIAL/PLATELET
BASOS PCT: 1 % (ref 0–1)
Basophils Absolute: 0 10*3/uL (ref 0.0–0.1)
EOS ABS: 0 10*3/uL (ref 0.0–0.7)
Eosinophils Relative: 2 % (ref 0–5)
HCT: 29.5 % — ABNORMAL LOW (ref 39.0–52.0)
HEMOGLOBIN: 9.3 g/dL — AB (ref 13.0–17.0)
Lymphocytes Relative: 41 % (ref 12–46)
Lymphs Abs: 0.5 10*3/uL — ABNORMAL LOW (ref 0.7–4.0)
MCH: 31.4 pg (ref 26.0–34.0)
MCHC: 31.5 g/dL (ref 30.0–36.0)
MCV: 99.7 fL (ref 78.0–100.0)
MONO ABS: 0.2 10*3/uL (ref 0.1–1.0)
Monocytes Relative: 17 % — ABNORMAL HIGH (ref 3–12)
NEUTROS PCT: 39 % — AB (ref 43–77)
Neutro Abs: 0.4 10*3/uL — ABNORMAL LOW (ref 1.7–7.7)
Platelets: 139 10*3/uL — ABNORMAL LOW (ref 150–400)
RBC: 2.96 MIL/uL — ABNORMAL LOW (ref 4.22–5.81)
RDW: 20.9 % — AB (ref 11.5–15.5)
WBC: 1.1 10*3/uL — CL (ref 4.0–10.5)

## 2014-06-21 LAB — COMPREHENSIVE METABOLIC PANEL
ALBUMIN: 2.7 g/dL — AB (ref 3.5–5.0)
ALT: 33 U/L (ref 17–63)
AST: 18 U/L (ref 15–41)
Alkaline Phosphatase: 67 U/L (ref 38–126)
Anion gap: 5 (ref 5–15)
BUN: 12 mg/dL (ref 6–20)
CALCIUM: 8.4 mg/dL — AB (ref 8.9–10.3)
CO2: 26 mmol/L (ref 22–32)
Chloride: 114 mmol/L — ABNORMAL HIGH (ref 101–111)
Creatinine, Ser: 0.58 mg/dL — ABNORMAL LOW (ref 0.61–1.24)
GFR calc Af Amer: >60 mL/min (ref >60)
GLUCOSE: 121 mg/dL — AB (ref 65–99)
Potassium: 3.6 mmol/L (ref 3.5–5.1)
Sodium: 145 mmol/L (ref 135–145)
TOTAL PROTEIN: 5.8 g/dL — AB (ref 6.5–8.1)
Total Bilirubin: 1.9 mg/dL — ABNORMAL HIGH (ref 0.3–1.2)

## 2014-06-21 LAB — LEGIONELLA ANTIGEN, URINE

## 2014-06-21 LAB — LACTIC ACID, PLASMA: LACTIC ACID, VENOUS: 1.6 mmol/L (ref 0.5–2.0)

## 2014-06-21 LAB — VANCOMYCIN, TROUGH: Vancomycin Tr: 7 ug/mL — ABNORMAL LOW (ref 10.0–20.0)

## 2014-06-21 MED ORDER — CHLORHEXIDINE GLUCONATE 0.12 % MT SOLN
15.0000 mL | Freq: Two times a day (BID) | OROMUCOSAL | Status: DC
  Administered 2014-06-21 – 2014-06-22 (×2): 15 mL via OROMUCOSAL
  Filled 2014-06-21 (×6): qty 15

## 2014-06-21 MED ORDER — LIDOCAINE VISCOUS 2 % MT SOLN
15.0000 mL | Freq: Once | OROMUCOSAL | Status: DC
  Filled 2014-06-21: qty 15

## 2014-06-21 MED ORDER — DEXTROSE 5 % IV SOLN
1.0000 g | Freq: Three times a day (TID) | INTRAVENOUS | Status: DC
  Administered 2014-06-21 – 2014-06-23 (×7): 1 g via INTRAVENOUS
  Filled 2014-06-21 (×8): qty 1

## 2014-06-21 MED ORDER — FLEET ENEMA 7-19 GM/118ML RE ENEM
1.0000 | ENEMA | Freq: Once | RECTAL | Status: AC
  Administered 2014-06-21: 1 via RECTAL
  Filled 2014-06-21: qty 1

## 2014-06-21 MED ORDER — DEXTROSE 50 % IV SOLN
INTRAVENOUS | Status: AC
  Administered 2014-06-21: 25 mL
  Filled 2014-06-21: qty 50

## 2014-06-21 MED ORDER — SIMETHICONE 40 MG/0.6ML PO SUSP
80.0000 mg | Freq: Three times a day (TID) | ORAL | Status: DC
  Administered 2014-06-21 – 2014-06-27 (×17): 80 mg via ORAL
  Filled 2014-06-21 (×27): qty 1.2

## 2014-06-21 MED ORDER — TBO-FILGRASTIM 480 MCG/0.8ML ~~LOC~~ SOSY
480.0000 ug | PREFILLED_SYRINGE | Freq: Once | SUBCUTANEOUS | Status: AC
  Administered 2014-06-21: 480 ug via SUBCUTANEOUS
  Filled 2014-06-21: qty 0.8

## 2014-06-21 MED ORDER — CETYLPYRIDINIUM CHLORIDE 0.05 % MT LIQD
7.0000 mL | Freq: Two times a day (BID) | OROMUCOSAL | Status: DC
  Administered 2014-06-21 – 2014-06-27 (×9): 7 mL via OROMUCOSAL

## 2014-06-21 MED ORDER — VANCOMYCIN HCL IN DEXTROSE 750-5 MG/150ML-% IV SOLN
750.0000 mg | Freq: Three times a day (TID) | INTRAVENOUS | Status: DC
  Administered 2014-06-22 – 2014-06-23 (×4): 750 mg via INTRAVENOUS
  Filled 2014-06-21 (×6): qty 150

## 2014-06-21 NOTE — Progress Notes (Signed)
[place] TEAM 1 - Stepdown/ICU TEAM Progress Note  Jeffery [name] ZOX:[id number] DOB: September 13, 1990 DOA: 06/19/2014 PCP: Marijean Bravo, MD  Admit HPI / Brief Narrative: Jeffery [name] is a 24 y.o. WM PMHx  cerebral palsy, spastic quadriplegia, wheelchair mobile, seizure disorder, has Port-A-Cath, PEG tube, hx of pancytopenia attributed to Keppra which was discontinued, OSA on BiPAP who presents with fever and cough.  Per patient's foster mother, patient has been having fever in the past 4 days. His temperature was 101.8 at one time point. Patient has mild cough with clear and thick sputum production. No dysuria, diarrhea, abdominal pain per caregiver. He was treated with Tylenol and ibuprofen by foster mother with some help.   In ED, patient was found to have WBC 1.6, negative urinalysis, lactate 1.48, temperature 104.1, tachycardia, electrolytes okay. chest x-ray is negative for acute abnormalities. Patient is admitted to inpatient for further evaluation treatment.  HPI/Subjective: 5/20 alert, eyes open will track you around the room with his eyes (mother says this is moving toward baseline).    Assessment/Plan: Fever and sepsis due to unspecified organism/neutropenic fever:  -Source of infection is not clear. Patient has WBC continue to trend down now 1.1, absolute neutrophil .  - Continue D5W-0.9% saline 14ml/hr  - Continue Vancomycin and cefepime given  - Lactic acid= 1.6 - Mucinex for cough  - Xopenex Neb QID  -DuoNeb q 4hr PRN - Urine legionella and S. pneumococcal antigen - Follow up blood culture x2, sputum culture and respiratory virus panel -Blood culture from Port-A-Cath pending  Acute on chronic respiratory failure -See sepsis due to unspecified organism -Titrate O2 to maintain SPO2> 93% -Continue BiPAP as needed to maintain SPO2> 93%  Pancytopenia:  -This was noticed in early February. Hematology, Dr. Marin Olp following and felt that this was related to [place]  which was discontinued. -Received neupogen in the past. hgb is 7.4--> 7.7, stable.  -follow up by CBC, transfuse as needed -Clobazam (Onfi) can cause,    -5/19 Transfuse 1 unit PRBC -Spoke with Dr. Marin Olp (oncology) and discuss this complicated case; agrees that we should start Neupogen 450 g daily. Out of town but will see patient in the a.m.  Bowel gas (ileus?) -Gas demonstrated in small and large bowel nonobstructed per radiology, however does appear still to be possible ileus (my read). -Mother relays that in the past Benytyl + Fleet enema has worked in the past to alleviate bowel gas. Will attempt this treatment first and if unsuccessful will place pediatric NG tube for decompression  Sizure disorder:  -Patient is allergic to several common seizure medications.  -Currently he is on Clobazam (Onfi) 6.25 mg TID  -PRN Diastat Acudial  OSA:  -Continue nightly BIPAP  Cerebral palsy/congenital spastic quadriplegia/baclofen pump: -continue Baclofen pump  Dysphagia,s/p PEG tube:  -Nutritionist consulted for management.  Quadriplegia -KinAir bed, ensure on rotation    Code Status: FULL Family Communication: no family present at time of exam Disposition Plan: Resolution sepsis    Consultants:   Procedure/Significant Events: 5/20 echocardiogram;- LVEF=55%-60%. -A patent foramen ovale cannot be excluded. - Pericardium, extracardiac: Prominant epicardial fat pad; small pericardial effusion? 5/20 portable abdominal x-ray; Gas is demonstrated within nondilated loops of large and small bowel in a nonobstructed pattern   Culture 5/18 urine negative 5/18 blood left hand 2 NGTD 5/18 MRSA by PCR negative 5/19 legionella urine antigen negative 5/19 strep pneumo urine antigen negative 5/19 blood pending 5/20 blood pending    Antibiotics: Cefepime 5/19>> Vancomycin 5/18>>  DVT prophylaxis: Heparin subcutaneous   Devices    LINES / TUBES:      Continuous  Infusions: . baclofen    . dextrose 5 % and 0.9% NaCl 125 mL/hr (06/20/14 1036)    Objective: VITAL SIGNS: Temp: 102.2 F (39 C) (05/20 1602) Temp Source: Rectal (05/20 1602) BP: 112/61 mmHg (05/20 1605) Pulse Rate: 86 (05/20 1605) SPO2; FIO2:   Intake/Output Summary (Last 24 hours) at 06/21/14 1843 Last data filed at 06/21/14 1730  Gross per 24 hour  Intake   2472 ml  Output    300 ml  Net   2172 ml     Exam: General: alert, eyes open will track you around the room with his eyes, No acute respiratory distress Eyes: Negative retinal hemorrhage, pupils equal react to light and accommodation ENT: Negative Runny nose   Neck: Negative scars, masses, torticollis, lymphadenopathy, JVD Lungs: Clear to auscultation bilaterally without wheezes or crackles Cardiovascular: Regular rate and rhythm without murmur gallop or rub normal S1 and S2 Abdomen: Nontender, distended, hard to palpation, hypoactive bowel sounds, no rebound, no ascites, no appreciable mass Extremities: No significant cyanosis, clubbing, or edema bilateral lower extremities, 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 Bilateral contracture of hands Psychiatric: Negative depression, negative anxiety, negative fatigue, negative mania  Neurologic: Cranial nerves II through XII intact, all extremities muscle strength 0/5,        Data Reviewed: Basic Metabolic Panel:  Recent Labs Lab 06/19/14 1815 06/19/14 2305 06/21/14 0520  NA 144 144 145  K 4.3 3.9 3.6  CL 104 109 114*  CO2 31 28 26   GLUCOSE 102* 99 121*  BUN 20 18 12   CREATININE 0.71 0.62 0.58*  CALCIUM 8.8* 7.6* 8.4*   Liver Function Tests:  Recent Labs Lab 06/19/14 1815 06/19/14 2305 06/21/14 0520  AST 23 17 18   ALT 38 30 33  ALKPHOS 82 66 67  BILITOT 1.2 1.1 1.9*  PROT 6.3* 5.1* 5.8*  ALBUMIN 3.2* 2.7* 2.7*   No results for input(s): LIPASE, AMYLASE in the  last 168 hours. No results for input(s): AMMONIA in the last 168 hours. CBC:  Recent Labs Lab 06/19/14 1815 06/19/14 2305 06/20/14 0900 06/21/14 0520  WBC 1.6* 1.4* 1.2* 1.1*  NEUTROABS 0.7*  --   --  0.4*  HGB 7.7* 6.4* 7.1* 9.3*  HCT 25.4* 20.4* 22.7* 29.5*  MCV 106.7* 107.4* 101.8* 99.7  PLT 190 174 126* 139*   Cardiac Enzymes: No results for input(s): CKTOTAL, CKMB, CKMBINDEX, TROPONINI in the last 168 hours. BNP (last 3 results) No results for input(s): BNP in the last 8760 hours.  ProBNP (last 3 results) No results for input(s): PROBNP in the last 8760 hours.  CBG:  Recent Labs Lab 06/20/14 2019 06/21/14 0014 06/21/14 0427 06/21/14 0820 06/21/14 1156  GLUCAP 92 112* 133* 77 135*    Recent Results (from the past 240 hour(s))  Urine culture     Status: None   Collection Time: 06/19/14  6:47 PM  Result Value Ref Range Status   Specimen Description URINE, CATHETERIZED  Final   Special Requests NONE  Final   Colony Count NO GROWTH Performed at [facility]   Final   Culture NO GROWTH Performed at [facility]   Final   Report Status 06/20/2014 FINAL  Final  Blood Culture (routine x 2)     Status: None (Preliminary result)   Collection Time: 06/19/14  7:10 PM  Result Value Ref Range Status   Specimen Description BLOOD LEFT HAND  Final   Special Requests BOTTLES DRAWN AEROBIC AND ANAEROBIC 3ML  Final   Culture   Final           BLOOD CULTURE RECEIVED NO GROWTH TO DATE CULTURE WILL BE HELD FOR 5 DAYS BEFORE ISSUING A FINAL NEGATIVE REPORT Performed at [facility]    Report Status PENDING  Incomplete  Blood Culture (routine x 2)     Status: None (Preliminary result)   Collection Time: 06/19/14  7:20 PM  Result Value Ref Range Status   Specimen Description BLOOD LEFT HAND  Final   Special Requests BOTTLES DRAWN AEROBIC AND ANAEROBIC 5CC  Final   Culture   Final           BLOOD CULTURE RECEIVED NO GROWTH TO DATE CULTURE WILL BE  HELD FOR 5 DAYS BEFORE ISSUING A FINAL NEGATIVE REPORT Performed at [facility]    Report Status PENDING  Incomplete  MRSA PCR Screening     Status: None   Collection Time: 06/19/14 11:48 PM  Result Value Ref Range Status   MRSA by PCR NEGATIVE NEGATIVE Final    Comment:        The GeneXpert MRSA Assay (FDA approved for NASAL specimens only), is one component of a comprehensive MRSA colonization surveillance program. It is not intended to diagnose MRSA infection nor to guide or monitor treatment for MRSA infections.      Studies:  Recent x-ray studies have been reviewed in detail by the Attending Physician  Scheduled Meds:  Scheduled Meds: . sodium chloride   Intravenous Once  . sodium chloride   Intravenous Once  . antiseptic oral rinse  7 mL Mouth Rinse q12n4p  . calcium carbonate (dosed in mg elemental calcium)  1,250 mg of elemental calcium Per Tube TID  . ceFEPime (MAXIPIME) IV  1 g Intravenous 3 times per day  . chlorhexidine  15 mL Mouth Rinse BID  . cloBAZam  6.25 mg Per Tube TID  . dicyclomine  10 mg Per Tube TID AC  . folic acid  1 mg Per Tube Daily  . free water  172 mL Per Tube TID AC & HS  . gabapentin  900 mg Per Tube TID  . heparin  5,000 Units Subcutaneous 3 times per day  . levalbuterol  0.63 mg Nebulization Q6H  . multivitamin  10 mL Per Tube Daily  . pantoprazole sodium  40 mg Per Tube BID  . potassium chloride  20 mEq Per Tube Daily  . pseudoephedrine  90 mg Per Tube 3 times per day  . simethicone  80 mg Oral TID AC & HS  . sodium chloride  3 mL Intravenous Q12H  . sucralfate  1 g Per Tube TID AC & HS  . vancomycin  750 mg Intravenous Q12H  . vitamin C  250 mg Per Tube Daily  . zinc oxide  1 application Topical 5 X Daily    Time spent on care of this patient: 40 mins   Jimie Kuwahara, Geraldo Docker , MD  [facility] Office  [phone] Pager [phone]  On-Call/Text Page:      [email or web address]      password [email or web address]  If 7PM-7AM, please  contact night-coverage [email or web address] Password [email or web address] 06/21/2014, 6:43 PM   LOS: 2 days   Care during the described time interval was provided by me .  I have reviewed this patient's  available data, including medical history, events of note, physical examination, radiology studies and test results as part of my evaluation  Dia Crawford, MD [phone] Pager

## 2014-06-21 NOTE — Progress Notes (Signed)
ANTIBIOTIC CONSULT NOTE - FOLLOW UP  Pharmacy Consult for vancomycin Indication: sepsis/HCAP  Allergies  Allergen Reactions  . Vimpat [Lacosamide] Rash  . Adhesive [Tape]     Rips skin off  . Depakote [Divalproex Sodium]     Causes pancreatitis   . Keppra [Levetiracetam] Other (See Comments)    Bone marrow suppression  . Amoxicillin-Pot Clavulanate Rash    Give w fluconazole then no rash  . Neulasta [Pegfilgrastim] Palpitations    Patient Measurements: Weight: 125 lb (56.7 kg) Adjusted Body Weight:   Vital Signs: Temp: 99.9 F (37.7 C) (05/20 1900) Temp Source: Rectal (05/20 1900) BP: 112/61 mmHg (05/20 1605) Pulse Rate: 86 (05/20 1605) Intake/Output from previous day: 05/19 0701 - 05/20 0700 In: 2307 [I.V.:1375; NG/GT:182; IV Piggyback:400] Out: 200 [Urine:200] Intake/Output from this shift:    Labs:  Recent Labs  06/19/14 1815 06/19/14 2305 06/20/14 0900 06/21/14 0520  WBC 1.6* 1.4* 1.2* 1.1*  HGB 7.7* 6.4* 7.1* 9.3*  PLT 190 174 126* 139*  CREATININE 0.71 0.62  --  0.58*   Estimated Creatinine Clearance: 79 mL/min (by C-G formula based on Cr of 0.58).  Recent Labs  06/21/14 1946  [place] 7*     Microbiology: Recent Results (from the past 720 hour(s))  Urine culture     Status: None   Collection Time: 06/19/14  6:47 PM  Result Value Ref Range Status   Specimen Description URINE, CATHETERIZED  Final   Special Requests NONE  Final   Colony Count NO GROWTH Performed at [facility]   Final   Culture NO GROWTH Performed at [facility]   Final   Report Status 06/20/2014 FINAL  Final  Blood Culture (routine x 2)     Status: None (Preliminary result)   Collection Time: 06/19/14  7:10 PM  Result Value Ref Range Status   Specimen Description BLOOD LEFT HAND  Final   Special Requests BOTTLES DRAWN AEROBIC AND ANAEROBIC 3ML  Final   Culture   Final           BLOOD CULTURE RECEIVED NO GROWTH TO DATE CULTURE WILL BE HELD FOR 5  DAYS BEFORE ISSUING A FINAL NEGATIVE REPORT Performed at [facility]    Report Status PENDING  Incomplete  Blood Culture (routine x 2)     Status: None (Preliminary result)   Collection Time: 06/19/14  7:20 PM  Result Value Ref Range Status   Specimen Description BLOOD LEFT HAND  Final   Special Requests BOTTLES DRAWN AEROBIC AND ANAEROBIC 5CC  Final   Culture   Final           BLOOD CULTURE RECEIVED NO GROWTH TO DATE CULTURE WILL BE HELD FOR 5 DAYS BEFORE ISSUING A FINAL NEGATIVE REPORT Performed at [facility]    Report Status PENDING  Incomplete  MRSA PCR Screening     Status: None   Collection Time: 06/19/14 11:48 PM  Result Value Ref Range Status   MRSA by PCR NEGATIVE NEGATIVE Final    Comment:        The GeneXpert MRSA Assay (FDA approved for NASAL specimens only), is one component of a comprehensive MRSA colonization surveillance program. It is not intended to diagnose MRSA infection nor to guide or monitor treatment for MRSA infections.     Anti-infectives    Start     Dose/Rate Route Frequency Ordered Stop   06/21/14 1000  ceFEPIme (MAXIPIME) 1 g in dextrose 5 % 50 mL IVPB  1 g 100 mL/hr over 30 Minutes Intravenous 3 times per day 06/21/14 0954     06/20/14 1000  ceFEPIme (MAXIPIME) 1 g in dextrose 5 % 50 mL IVPB  Status:  Discontinued     1 g 100 mL/hr over 30 Minutes Intravenous Every 12 hours 06/19/14 1939 06/21/14 0954   06/20/14 0800  vancomycin (VANCOCIN) IVPB 750 mg/150 ml premix     750 mg 150 mL/hr over 60 Minutes Intravenous Every 12 hours 06/19/14 1939     06/19/14 1830  ceFEPIme (MAXIPIME) 2 g in dextrose 5 % 50 mL IVPB     2 g 100 mL/hr over 30 Minutes Intravenous  Once 06/19/14 1815 06/19/14 2008   06/19/14 1830  vancomycin (VANCOCIN) IVPB 1000 mg/200 mL premix     1,000 mg 200 mL/hr over 60 Minutes Intravenous  Once 06/19/14 1815 06/19/14 2140      Assessment: 24 yo male with sepsis / HCAP is currently on subtherapeutic  vancomycin. Vancomycin level is 7.  Goal of Therapy:  Vancomycin trough level 15-20 mcg/ml  Plan:  - increase vancomycin to 750 mg iv q8h - recheck vancomycin trough @ steady state  Izek Corvino, Tsz-Yin 06/21/2014,8:30 PM

## 2014-06-21 NOTE — Progress Notes (Signed)
  Echocardiogram 2D Echocardiogram has been performed.  Jeffery [name] 06/21/2014, 11:42 AM

## 2014-06-21 NOTE — Progress Notes (Signed)
Initial Nutrition Assessment  DOCUMENTATION CODES:  Not applicable  INTERVENTION:  Tube feeding, Prostat   If started, resume home TF regimen of Two Cal HN at 46 ml/hr via J-port x 12 hours (7 AM-7 PM)  Prostat liquid protein 30 ml BID  Total TF regimen to provide 1304 kcals, 76 gm protein, 386 ml of free water  NUTRITION DIAGNOSIS:  Inadequate oral intake related to inability to eat, chronic illness as evidenced by NPO status  GOAL:  Patient will meet greater than or equal to 90% of their needs  MONITOR:  TF tolerance, Labs, Weight trends, I & O's  REASON FOR ASSESSMENT:  Low [place]  ASSESSMENT: 24 y.o. Male with PMH of history of cerebral palsy, spastic quadriplegia, wheelchair mobile, seizure disorder, has Port-A-Cath, G-J tube, hx of pancytopenia attributed to Keppra which was discontinued, OSA on BiPAP who presents with fever and cough.  RD spoke with Baker Janus, patient's Legal Guardian.   Pt receives Two Cal HN formula at 46 ml/hr x 12 hours via J-port of G-J tube.   Also recieves Promod liquid protein 30 ml BID.   Weight has been stable.   Baker Janus has brought patient's TF formula to hospital and would like to use it during stay.  Height: 4' 11.5" (1.511 m)  Weight:  Wt Readings from Last 1 Encounters:  06/19/14 125 lb (56.7 kg)    Ideal Body Weight:  46.8 kg  Wt Readings from Last 10 Encounters:  06/19/14 125 lb (56.7 kg)  06/03/14 124 lb (56.246 kg)  05/11/14 122 lb 5.7 oz (55.5 kg)  04/13/14 127 lb (57.607 kg)  04/03/14 129 lb (58.514 kg)  03/02/14 134 lb 0.6 oz (60.8 kg)  12/12/13 128 lb (58.06 kg)  04/17/13 124 lb (56.246 kg)  12/25/12 116 lb (52.617 kg)  11/29/12 116 lb 2.9 oz (52.7 kg)     Estimated Nutritional Needs:  Kcal:  1450-1550  Protein:  65-80 gm  Fluid:  >/= 1.5 L  Skin:  Reviewed, no issues  Diet Order:  NPO  EDUCATION NEEDS:  No education needs identified at this time   Intake/Output Summary (Last 24 hours) at  06/21/14 1215 Last data filed at 06/21/14 1000  Gross per 24 hour  Intake   2672 ml  Output    200 ml  Net   2472 ml    Last BM:  Not documented  Arthur Holms, RD, LDN Pager #: [phone] After-Hours Pager #: [phone]

## 2014-06-21 NOTE — Progress Notes (Signed)
ANTIBIOTIC CONSULT NOTE - FOLLOW UP  Pharmacy Consult for Vancomycin and Cefepime Indication: Sepsis/HCAP  Allergies  Allergen Reactions  . Vimpat [Lacosamide] Rash  . Adhesive [Tape]     Rips skin off  . Depakote [Divalproex Sodium]     Causes pancreatitis   . Keppra [Levetiracetam] Other (See Comments)    Bone marrow suppression  . Amoxicillin-Pot Clavulanate Rash    Give w fluconazole then no rash  . Neulasta [Pegfilgrastim] Palpitations    Patient Measurements: Weight: 125 lb (56.7 kg)  Vital Signs: Temp: 102.1 F (38.9 C) (05/20 0811) Temp Source: Axillary (05/20 0811) BP: 110/59 mmHg (05/20 0811) Pulse Rate: 99 (05/20 0811) Intake/Output from previous day: 05/19 0701 - 05/20 0700 In: 2307 [I.V.:1375; NG/GT:182; IV Piggyback:400] Out: 200 [Urine:200] Intake/Output from this shift: Total I/O In: 125 [I.V.:125] Out: -   Labs:  Recent Labs  06/19/14 1815 06/19/14 2305 06/20/14 0900 06/21/14 0520  WBC 1.6* 1.4* 1.2* 1.1*  HGB 7.7* 6.4* 7.1* 9.3*  PLT 190 174 126* 139*  CREATININE 0.71 0.62  --  0.58*   Estimated Creatinine Clearance: 79 mL/min (by C-G formula based on Cr of 0.58).  Assessment: 23yo quadriplegic male continues on day # 3 vancomycin and cefepime for sepsis 2/2 HCAP. Renal function improved from admission, however, CrCl may be overestimated given quadriplegia/muscle wasting. He continues to have fevers and his WBC is down to 1.1.  5/18 mrsa: neg 5/18 blood cx: ngtd 5/19 blood cx: 5/20 blood cx: 5/18 urine cx: negative  5/18 vancomycin > 5/18 cefepime >   Goal of Therapy:  Vancomycin trough level 15-20 mcg/ml  Plan:  1) Continue vancomycin 750mg  IV q12 - trough tonight given continued fevers, quadriplegia, and improved renal function 2) Increase cefepime to 1g IV q8  Deboraha Sprang 06/21/2014,9:50 AM

## 2014-06-22 DIAGNOSIS — D72819 Decreased white blood cell count, unspecified: Secondary | ICD-10-CM

## 2014-06-22 DIAGNOSIS — R1310 Dysphagia, unspecified: Secondary | ICD-10-CM

## 2014-06-22 DIAGNOSIS — G809 Cerebral palsy, unspecified: Secondary | ICD-10-CM

## 2014-06-22 DIAGNOSIS — E46 Unspecified protein-calorie malnutrition: Secondary | ICD-10-CM

## 2014-06-22 DIAGNOSIS — G4733 Obstructive sleep apnea (adult) (pediatric): Secondary | ICD-10-CM | POA: Diagnosis present

## 2014-06-22 DIAGNOSIS — D649 Anemia, unspecified: Secondary | ICD-10-CM

## 2014-06-22 DIAGNOSIS — G8389 Other specified paralytic syndromes: Secondary | ICD-10-CM

## 2014-06-22 DIAGNOSIS — E876 Hypokalemia: Secondary | ICD-10-CM

## 2014-06-22 DIAGNOSIS — A419 Sepsis, unspecified organism: Principal | ICD-10-CM

## 2014-06-22 DIAGNOSIS — R509 Fever, unspecified: Secondary | ICD-10-CM

## 2014-06-22 DIAGNOSIS — G8250 Quadriplegia, unspecified: Secondary | ICD-10-CM | POA: Diagnosis present

## 2014-06-22 LAB — COMPREHENSIVE METABOLIC PANEL
ALT: 11 U/L — ABNORMAL LOW (ref 17–63)
AST: 17 U/L (ref 15–41)
Albumin: 2.9 g/dL — ABNORMAL LOW (ref 3.5–5.0)
Alkaline Phosphatase: 42 U/L (ref 38–126)
Anion gap: 7 (ref 5–15)
BILIRUBIN TOTAL: 0.5 mg/dL (ref 0.3–1.2)
BUN: 21 mg/dL — ABNORMAL HIGH (ref 6–20)
CALCIUM: 8.4 mg/dL — AB (ref 8.9–10.3)
CO2: 26 mmol/L (ref 22–32)
Chloride: 103 mmol/L (ref 101–111)
Creatinine, Ser: 1 mg/dL (ref 0.61–1.24)
Glucose, Bld: 159 mg/dL — ABNORMAL HIGH (ref 65–99)
Potassium: 4.8 mmol/L (ref 3.5–5.1)
SODIUM: 136 mmol/L (ref 135–145)
Total Protein: 5.4 g/dL — ABNORMAL LOW (ref 6.5–8.1)

## 2014-06-22 LAB — GLUCOSE, CAPILLARY
GLUCOSE-CAPILLARY: 94 mg/dL (ref 65–99)
GLUCOSE-CAPILLARY: 104 mg/dL — AB (ref 65–99)
Glucose-Capillary: 97 mg/dL (ref 65–99)
Glucose-Capillary: 99 mg/dL (ref 65–99)
Glucose-Capillary: 107 mg/dL — ABNORMAL HIGH (ref 65–99)

## 2014-06-22 LAB — CBC WITH DIFFERENTIAL/PLATELET
Basophils Absolute: 0 10*3/uL (ref 0.0–0.1)
Basophils Relative: 0 % (ref 0–1)
EOS ABS: 0.2 10*3/uL (ref 0.0–0.7)
EOS PCT: 2 % (ref 0–5)
HCT: 28 % — ABNORMAL LOW (ref 39.0–52.0)
Hemoglobin: 8.9 g/dL — ABNORMAL LOW (ref 13.0–17.0)
Lymphocytes Relative: 17 % (ref 12–46)
Lymphs Abs: 1.5 10*3/uL (ref 0.7–4.0)
MCH: 30.9 pg (ref 26.0–34.0)
MCHC: 31.8 g/dL (ref 30.0–36.0)
MCV: 97.2 fL (ref 78.0–100.0)
Monocytes Absolute: 1 10*3/uL (ref 0.1–1.0)
Monocytes Relative: 12 % (ref 3–12)
NEUTROS ABS: 5.9 10*3/uL (ref 1.7–7.7)
Neutrophils Relative %: 69 % (ref 43–77)
PLATELETS: 166 10*3/uL (ref 150–400)
RBC: 2.88 MIL/uL — AB (ref 4.22–5.81)
RDW: 14.1 % (ref 11.5–15.5)
WBC: 8.5 10*3/uL (ref 4.0–10.5)

## 2014-06-22 LAB — IRON AND TIBC
Iron: 19 ug/dL — ABNORMAL LOW (ref 45–182)
Saturation Ratios: 14 % — ABNORMAL LOW (ref 17.9–39.5)
TIBC: 133 ug/dL — ABNORMAL LOW (ref 250–450)
UIBC: 114 ug/dL

## 2014-06-22 LAB — FERRITIN: Ferritin: 1054 ng/mL — ABNORMAL HIGH (ref 24–336)

## 2014-06-22 LAB — SAVE SMEAR

## 2014-06-22 MED ORDER — DEXTROSE 10 % IV SOLN
INTRAVENOUS | Status: DC
  Administered 2014-06-22 (×2): via INTRAVENOUS

## 2014-06-22 MED ORDER — DEXTROSE-NACL 5-0.9 % IV SOLN
INTRAVENOUS | Status: DC
  Administered 2014-06-22: 18:00:00 via INTRAVENOUS

## 2014-06-22 MED ORDER — IPRATROPIUM-ALBUTEROL 0.5-2.5 (3) MG/3ML IN SOLN
3.0000 mL | Freq: Four times a day (QID) | RESPIRATORY_TRACT | Status: DC | PRN
  Administered 2014-06-24: 3 mL via RESPIRATORY_TRACT
  Filled 2014-06-22: qty 3

## 2014-06-22 MED ORDER — BACITRACIN ZINC 500 UNIT/GM EX OINT
TOPICAL_OINTMENT | Freq: Two times a day (BID) | CUTANEOUS | Status: DC
  Administered 2014-06-22: 1 via TOPICAL
  Administered 2014-06-23 – 2014-06-25 (×4): via TOPICAL
  Filled 2014-06-22: qty 28.35

## 2014-06-22 MED ORDER — LEVALBUTEROL HCL 0.63 MG/3ML IN NEBU
0.6300 mg | INHALATION_SOLUTION | RESPIRATORY_TRACT | Status: DC | PRN
  Administered 2014-06-22 – 2014-06-24 (×4): 0.63 mg via RESPIRATORY_TRACT
  Filled 2014-06-22 (×4): qty 3

## 2014-06-22 NOTE — Progress Notes (Signed)
{  t had a hypoglycemic event of a CBG 63 1/2 amp D50 given MD notified orders received repeat CBG 159, Patient resting NAD, VSS

## 2014-06-22 NOTE — Consult Note (Signed)
Referral MD  Reason for Referral: Fever and leukopenia/anemia   Chief Complaint  Patient presents with  . Fever  : Cannot give history  HPI: Mr. Kuyper is a 24 year old gentleman. He has severe cerebral palsy. He is basically bedbound or in a wheelchair. He really has great care from his adopted parents.  He's had a history of seizures. He's had a recent history of leukopenia and anemia. It was felt that this may been from his seizure medications. He's had a seizure medications adjusted. He seemed to improve. He has been getting some Neupogen on occasion.  He developed a temperature at home. Because of his cerebral palsy and body habitus, is been hard to get him to and from the office. He had a portable chest x-ray done at home. This did not show any obvious pneumonia.  He continued to have issues at home. He is having some shortness of breath. He's having some sweats. His mom so that his temperature went up to 104.  He subsequently was admitted. On admission, his white count 1.6. Hemoglobin 7.7 platelet count 190.  His mom so that he is not bleeding. He has gotten iron in the office.  He was transfused on the 18th or 19th.  He was started a Neupogen on the 20th. Pancultures so far of all been negative.  He has a chronic indwelling Foley catheter.  He is on IV antibiotics. He still having some temperatures.  We had talked about doing a bone marrow biopsy on him. However this would be incredibly difficult given his cerebral palsy and very limited motion.  He's had no rashes. He has a tube feedings for nutrition.Marland Kitchen He's had some diarrhea.  We are asked to see him to try to help with management.           Past Medical History  Diagnosis Date  . CP (cerebral palsy), spastic, quadriplegic   . Osteoporosis   . Undescended testes   . Seasonal allergies   . IVH (intraventricular hemorrhage) 18-Apr-1990    Grade IV  . Hip dislocation, bilateral   . Dysphagia   . Retinopathy of  prematurity   . 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 Neuromuscular scoliosis   . Osteoporosis   . Complex partial seizures   . Generalized convulsive epilepsy without mention of intractable epilepsy   . Sinus bradycardia     HR drops to 38-40 while sleeping  . Blister of right heel     fluid filled; origin unknown  . Kidney stones     ?  Marland Kitchen Pneumonia      chronic pneumonia ,respitory failure dx Augest 2014  . Aspiration pneumonia     "chronic" (04/12/2014)  . OSA treated with BiPAP     "since age 24"   . Anemia   . 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 GERD (gastroesophageal reflux disease)   . Epilepsy   . Spastic quadriplegia   :  Past Surgical History  Procedure Laterality Date  . Mole removal  "several B/T 2008-2010"    "from all over"  . Jejunostomy feeding tube  03/08/2013; ~ 09/2013; ~ 01/2014    "transgastric-jujunal feeding tube"  . Gastrostomy tube placement  11./02/1998       . Button change  12/15/2010    Procedure: BUTTON CHANGE;  Surgeon: Gatha Mayer, MD;  Location: [facility];  Service: Endoscopy;  Laterality: N/A;  .  Peg placement  10/07/2011    Procedure: PERCUTANEOUS ENDOSCOPIC GASTROSTOMY (PEG) REPLACEMENT;  Surgeon: Lafayette Dragon, MD;  Location: [facility];  Service: Endoscopy;  Laterality: N/A;  Needs 18 F 2.5 button ordered-dl  . Inguinal hernia repair Bilateral 1992  . Retinopathy of prematurity surgery  1992  . Achilles tendon lengthening Bilateral 12/1998  . Tendon release  12/1998    Soft tissue releases  wrists and fingers [Other]  . Infusion pump implantation  07/25/2000; 2013    Intrathecal baclofen pump  . Peg placement N/A 06/05/2012    Procedure: PERCUTANEOUS ENDOSCOPIC GASTROSTOMY (PEG) REPLACEMENT;  Surgeon: Lafayette Dragon, MD;  Location: [facility];  Service: Endoscopy;  Laterality: N/A;  button 51f.2.5cm  . Strabismus surgery Bilateral 1993    "3 on right; 2 on left"   . Back surgery  ~ 2008    Harrington Rods in back needs to be log rolled  . Tendon repair Bilateral 09/05/2012    Procedure: LENGTHENING OF DIGITAL FLEXOR TENDONS BILTERAL HANDS;  Surgeon: DJolyn Nap MD;  [facility]  Service: [place]ics;  Laterality: Bilateral;  . Peg placement N/A 09/13/2012    Procedure: PERCUTANEOUS ENDOSCOPIC GASTROSTOMY (PEG) REPLACEMENT;  Surgeon: DLafayette Dragon MD;  Location: [facility];  Service: Endoscopy;  Laterality: N/A;  . Flexible sigmoidoscopy N/A 10/30/2012    Procedure: FLEXIBLE SIGMOIDOSCOPY;  Surgeon: RCleotis Nipper MD;  Location: [facility];  Service: Endoscopy;  Laterality: N/A;  . Peg placement N/A 11/15/2012    Procedure: PERCUTANEOUS ENDOSCOPIC GASTROSTOMY (PEG) REPLACEMENT;  Surgeon: MJeryl Columbia MD;  [facility];  Service: Endoscopy;  Laterality: N/A;  . Gastrostomy tube placement  11./02/1998  . Rectal biopsy  10/29/2012    S/P diarrhea from Vancomycin  . Portacath placement Right 11/25/2012    chest  . Eye surgery    :   Current facility-administered medications:  .  0.9 %  sodium chloride infusion, , Intravenous, Once, KJeryl Columbia NP, Stopped at 06/20/14 1036 .  0.9 %  sodium chloride infusion, , Intravenous, Once, CAllie Bossier MD .  acetaminophen (TYLENOL) solution 650 mg, 650 mg, Per Tube, Q6H PRN, XIvor Costa MD, 650 mg at 06/21/14 1547 .  antiseptic oral rinse (CPC / CETYLPYRIDINIUM CHLORIDE 0.05%) solution 7 mL, 7 mL, Mouth Rinse, q12n4p, RMargot Ables RN, 7 mL at 06/21/14 1745 .  baclofen (GABLOFEN) intrathecal injection 40000 mcg/210m 385.2 mcg, Intrathecal, Continuous, XiIvor CostaMD .  calcium carbonate (dosed in mg elemental calcium) suspension 1,250 mg of elemental calcium, 1,250 mg of elemental calcium, Per Tube, TID, XiIvor CostaMD, 1,250 mg of elemental calcium at 06/21/14 2339 .  ceFEPIme (MAXIPIME) 1 g in dextrose 5 % 50 mL IVPB, 1 g, Intravenous, 3 times per day, JeOtilio MiuRPH, 1  g at 06/22/14 0210 .  chlorhexidine (PERIDEX) 0.12 % solution 15 mL, 15 mL, Mouth Rinse, BID, ReMargot AblesRN, 15 mL at 06/21/14 2000 .  cloBAZam (ONFI) 2.5 MG/ML oral suspension 6.25 mg, 6.25 mg, Per Tube, TID, XiIvor CostaMD, 6.25 mg at 06/21/14 2338 .  dextrose 10 % infusion, , Intravenous, Continuous, ThDianne DunNP, Last Rate: 75 mL/hr at 06/22/14 [id number]  diazepam (DIASTAT ACUDIAL) rectal kit 10 mg, 10 mg, Rectal, PRN, XiIvor CostaMD .  dicyclomine (BENTYL) 10 MG/5ML syrup 10 mg, 10 mg, Per Tube, TID AC, XiIvor CostaMD, 10 mg at 06/20/14 0800 .  folic acid (FOLVITE) tablet 1 mg, 1  mg, Per Tube, Daily, Ivor Costa, MD, 1 mg at 06/20/14 0800 .  free water 172 mL, 172 mL, Per Tube, TID AC & HS, Ivor Costa, MD, 172 mL at 06/20/14 2200 .  gabapentin (NEURONTIN) 250 MG/5ML solution 900 mg, 900 mg, Per Tube, TID, Ivor Costa, MD, 900 mg at 06/21/14 2338 .  guaiFENesin-dextromethorphan (ROBITUSSIN DM) 100-10 MG/5ML syrup 5 mL, 5 mL, Per Tube, Q4H PRN, Ivor Costa, MD .  heparin injection 5,000 Units, 5,000 Units, Subcutaneous, 3 times per day, Ivor Costa, MD, 5,000 Units at 06/22/14 [phone] .  ibuprofen (ADVIL,MOTRIN) 100 MG/5ML suspension 400 mg, 400 mg, Per Tube, Q6H PRN, Ivor Costa, MD .  ipratropium-albuterol (DUONEB) 0.5-2.5 (3) MG/3ML nebulizer solution 3 mL, 3 mL, Nebulization, Q4H PRN, Ivor Costa, MD, 3 mL at 06/22/14 0706 .  ketoconazole (NIZORAL) 2 % cream 1 application, 1 application, Topical, BID PRN, Ivor Costa, MD .  levalbuterol [facility]) nebulizer solution 0.63 mg, 0.63 mg, Nebulization, Q6H, Ivor Costa, MD, 0.63 mg at 06/22/14 0109 .  lidocaine (XYLOCAINE) 2 % viscous mouth solution 15 mL, 15 mL, Mouth/Throat, Once, Allie Bossier, MD .  multivitamin liquid 10 mL, 10 mL, Per Tube, Daily, Ivor Costa, MD, 10 mL at 06/20/14 1000 .  mupirocin ointment (BACTROBAN) 2 % 1 application, 1 application, Topical, PRN, Ivor Costa, MD .  ondansetron (ZOFRAN) tablet 4 mg, 4 mg, Oral, Q6H PRN **OR**  ondansetron (ZOFRAN) injection 4 mg, 4 mg, Intravenous, Q6H PRN, Ivor Costa, MD .  pantoprazole sodium (PROTONIX) 40 mg/20 mL oral suspension 40 mg, 40 mg, Per Tube, BID, Ivor Costa, MD, Stopped at 06/21/14 1930 .  potassium chloride 20 MEQ/15ML (10%) solution 20 mEq, 20 mEq, Per Tube, Daily, Ivor Costa, MD, 20 mEq at 06/21/14 1546 .  pseudoephedrine (SUDAFED) 30 MG/5ML syrup 90 mg, 90 mg, Per Tube, 3 times per day, Allie Bossier, MD, 90 mg at 06/21/14 2336 .  simethicone (MYLICON) 40 MV/7.8IO suspension 80 mg, 80 mg, Oral, TID AC & HS, Allie Bossier, MD, 80 mg at 06/21/14 2339 .  sodium chloride 0.9 % injection 3 mL, 3 mL, Intravenous, Q12H, Ivor Costa, MD, 3 mL at 06/21/14 1000 .  sodium phosphate (FLEET) 7-19 GM/118ML enema 1 enema, 1 enema, Rectal, Daily PRN, Ivor Costa, MD .  sucralfate (CARAFATE) 1 GM/10ML suspension 1 g, 1 g, Per Tube, TID AC & HS, Ivor Costa, MD, 1 g at 06/21/14 2338 .  vancomycin (VANCOCIN) IVPB 750 mg/150 ml premix, 750 mg, Intravenous, Q8H, Allie Bossier, MD, 750 mg at 06/22/14 0538 .  vitamin C (ASCORBIC ACID) tablet 250 mg, 250 mg, Per Tube, Daily, Ivor Costa, MD, 250 mg at 06/20/14 0800 .  zinc oxide (BALMEX) 96.2 % cream 1 application, 1 application, Topical, 5 X Daily, Ivor Costa, MD, 1 application at 95/28/41 0600:  . sodium chloride   Intravenous Once  . sodium chloride   Intravenous Once  . antiseptic oral rinse  7 mL Mouth Rinse q12n4p  . calcium carbonate (dosed in mg elemental calcium)  1,250 mg of elemental calcium Per Tube TID  . ceFEPime (MAXIPIME) IV  1 g Intravenous 3 times per day  . chlorhexidine  15 mL Mouth Rinse BID  . cloBAZam  6.25 mg Per Tube TID  . dicyclomine  10 mg Per Tube TID AC  . folic acid  1 mg Per Tube Daily  . free water  172 mL Per Tube TID AC & HS  . gabapentin  900  mg Per Tube TID  . heparin  5,000 Units Subcutaneous 3 times per day  . levalbuterol  0.63 mg Nebulization Q6H  . lidocaine  15 mL Mouth/Throat Once  . multivitamin  10  mL Per Tube Daily  . pantoprazole sodium  40 mg Per Tube BID  . potassium chloride  20 mEq Per Tube Daily  . pseudoephedrine  90 mg Per Tube 3 times per day  . simethicone  80 mg Oral TID AC & HS  . sodium chloride  3 mL Intravenous Q12H  . sucralfate  1 g Per Tube TID AC & HS  . vancomycin  750 mg Intravenous Q8H  . vitamin C  250 mg Per Tube Daily  . zinc oxide  1 application Topical 5 X Daily  :  Allergies  Allergen Reactions  . Vimpat [Lacosamide] Rash  . Adhesive [Tape]     Rips skin off  . Depakote [Divalproex Sodium]     Causes pancreatitis   . Keppra [Levetiracetam] Other (See Comments)    Bone marrow suppression  . Amoxicillin-Pot Clavulanate Rash    Give w fluconazole then no rash  . Neulasta [Pegfilgrastim] Palpitations  :  Family History  Problem Relation Age of Onset  . Adopted: Yes  :  History   Social History  . Marital Status: Single    Spouse Name: N/A  . Number of Children: 0  . Years of Education: N/A   Occupational History  . Student    .     Social History Main Topics  . Smoking status: Never Smoker   . Smokeless tobacco: Never Used     Comment: never used tobacco  . Alcohol Use: No  . Drug Use: No  . Sexual Activity: No   Other Topics Concern  . Not on file   Social History Narrative   Pt lives at home with his legal guardians Jenne Campus)        :  Pertinent items are noted in HPI.  Exam: Patient Vitals for the past 24 hrs:  BP Temp Temp src Pulse Resp SpO2  06/22/14 0700 - (!) 100.8 F (38.2 C) Rectal - - -  06/22/14 0424 108/63 mmHg - - (!) 114 (!) 27 92 %  06/22/14 0421 - 100 F (37.8 C) Axillary - - -  06/22/14 0110 - - - (!) 126 (!) 37 100 %  06/21/14 2316 106/79 mmHg - - (!) 101 20 97 %  06/21/14 2313 - 99.3 F (37.4 C) Axillary - - -  06/21/14 2152 - - - (!) 122 (!) 25 100 %  06/21/14 1900 - 99.9 F (37.7 C) Rectal - - -  06/21/14 1605 112/61 mmHg - - 86 17 100 %  06/21/14 1602 - (!) 102.2 F (39 C) Rectal -  - -  06/21/14 1500 - (!) 103.1 F (39.5 C) Rectal - - -  06/21/14 1420 117/68 mmHg - - (!) 102 (!) 22 95 %  06/21/14 1415 117/68 mmHg - - (!) 106 20 100 %  06/21/14 1414 - - - - - 100 %  06/21/14 1154 - - - - - 98 %  06/21/14 1150 - - - 97 (!) 28 -   as above  Recent Labs  06/21/14 0520 06/22/14 0526  WBC 1.1* 8.5  HGB 9.3* 8.9*  HCT 29.5* 28.0*  PLT 139* 166    Recent Labs  06/21/14 0520 06/22/14 0526  NA 145 136  K 3.6 4.8  CL  114* 103  CO2 26 26  GLUCOSE 121* 159*  BUN 12 21*  CREATININE 0.58* 1.00  CALCIUM 8.4* 8.4*    Blood smear review:  none  Pathology: none     Assessment and Plan ;  Mr. Linse is a 24 year old gentleman. He has severe cerebral palsy. He is on numerous medications.  This is a very difficult situation. I still have to believe that the leukopenia is from medications. However, it is hard to figure out what medication is causing the problem. His heart to stop everything because of his seizure risk.  It would be nice to stop some of the medicines he is on.  I still worry about him having some kind of bleeding and GI blood loss. Checking his stool at think will be necessary.  His mom wants to try to minimize anything invasive. He really has had a very difficult life. She is very dedicated to taking care of him.  Under the microscope, I really don't see anything that looks suspicious.  He responds to the Neupogen.  His white cell count was up today.  For now, I will continue the antibiotics. I would make sure I would check him for a Viral infection. I will check for influenza. I'll also check for H1N1.  I would think that a bone marrow test would be last resort. However, this certainly could give some meaningful information. The real problem is that if we find anything, we really are not able to treat it because of his overall diagnosis from the cerebral palsy.  As always, I had a great talk with his mom. She is very well aware what is going  on and has a very good sense of what is best for her son. She has been incredible source of strength.  We will follow along.  [name]

## 2014-06-22 NOTE — Progress Notes (Signed)
Jeffery [name] TEAM 1 - Stepdown/ICU TEAM Progress Note  Jeffery [name] WER:[id number] DOB: 02-Jan-1991 DOA: 06/19/2014 PCP: Marijean Bravo, MD  Admit HPI / Brief Narrative: Jeffery [name] is a 24 y.o. WM PMHx  cerebral palsy, spastic quadriplegia, wheelchair mobile, seizure disorder, has Port-A-Cath, PEG tube, hx of pancytopenia attributed to Keppra which was discontinued, OSA on BiPAP who presents with fever and cough.  Per patient's foster mother, patient has been having fever in the past 4 days. His temperature was 101.8 at one time point. Patient has mild cough with clear and thick sputum production. No dysuria, diarrhea, abdominal pain per caregiver. He was treated with Tylenol and ibuprofen by foster mother with some help.   In ED, patient was found to have WBC 1.6, negative urinalysis, lactate 1.48, temperature 104.1, tachycardia, electrolytes okay. chest x-ray is negative for acute abnormalities. Patient is admitted to inpatient for further evaluation treatment.  HPI/Subjective: 5/21 alert, eyes open will track you around the room with his eyes (mother says this is moving toward baseline), moving his arms and her mother blew kiss at nurse earlier today.     Assessment/Plan: Fever and sepsis due to unspecified organism/neutropenic fever:  -Source of infection is not clear. Patient has WBC continue to trend down now 1.1, absolute neutrophil .  - Continue D5W-0.9% saline 77ml/hr + D10 @ 75 ml/hr - Continue Vancomycin and cefepime given  - Lactic acid= 1.6 - Mucinex for cough  - Xopenex Neb QID  -DuoNeb q 4hr PRN - Urine legionella and S. pneumococcal antigen - Follow up blood culture x2, sputum culture and respiratory virus panel -Blood culture from Port-A-Cath NGTD\  Acute on chronic respiratory failure -See sepsis due to unspecified organism -Titrate O2 to maintain SPO2> 93% -Continue BiPAP as needed to maintain SPO2> 93%  Pancytopenia:  -This was noticed in early  February. Hematology, Dr. Marin Olp following and felt that this was related to [name] which was discontinued. -Received neupogen in the past. hgb is 7.4--> 7.7, stable.  -follow up by CBC, transfuse as needed -Clobazam (Onfi) can cause,    -5/19 Transfuse 1 unit PRBC -Spoke with Dr. Marin Olp (oncology) and discuss this complicated case; agrees that we should start Neupogen 450 g daily.   Bowel gas (ileus?) -Gas demonstrated in small and large bowel nonobstructed per radiology, however does appear still to be possible ileus (my read). -Mother relays that in the past Benytyl + Fleet enema has worked in the past to alleviate bowel gas.   Sizure disorder:  -Patient is allergic to several common seizure medications.  -Currently he is on Clobazam (Onfi) 6.25 mg TID  -PRN Diastat Acudial  OSA:  -Continue nightly BIPAP  Cerebral palsy/congenital spastic quadriplegia/baclofen pump: -continue Baclofen pump  Dysphagia,s/p PEG tube:  -Nutritionist consulted for management.  Quadriplegia -KinAir bed, ensure on rotation    Code Status: FULL Family Communication: Mother present at time of exam Disposition Plan: Resolution sepsis    Consultants:   Procedure/Significant Events: 5/20 echocardiogram;- LVEF=55%-60%. -A patent foramen ovale cannot be excluded. - Pericardium, extracardiac: Prominant epicardial fat pad; small pericardial effusion? 5/20 portable abdominal x-ray; Gas is demonstrated within nondilated loops of large and small bowel in a nonobstructed pattern   Culture 5/18 urine negative 5/18 blood left hand 2 NGTD 5/18 MRSA by PCR negative 5/19 legionella urine antigen negative 5/19 strep pneumo urine antigen negative 5/19 blood Porta cath NGTD 5/20 blood left hand NGTD    Antibiotics: Cefepime 5/19>> Vancomycin 5/18>>   DVT  prophylaxis: Heparin subcutaneous   Devices    LINES / TUBES:      Continuous Infusions: . baclofen    . dextrose 75 mL/hr  at 06/22/14 1746  . dextrose 5 % and 0.9% NaCl 75 mL/hr at 06/22/14 1747    Objective: VITAL SIGNS: Temp: 99.9 F (37.7 C) (05/21 1926) Temp Source: Rectal (05/21 1926) BP: 107/59 mmHg (05/21 1926) Pulse Rate: 91 (05/21 2010) SPO2; FIO2:   Intake/Output Summary (Last 24 hours) at 06/22/14 2123 Last data filed at 06/22/14 1721  Gross per 24 hour  Intake   1875 ml  Output     75 ml  Net   1800 ml     Exam: General: alert, eyes open will track you around the room with his eyes, No acute respiratory distress Eyes: Negative retinal hemorrhage, pupils equal react to light and accommodation ENT: Negative Runny nose   Neck: Negative scars, masses, torticollis, lymphadenopathy, JVD Lungs: Clear to auscultation bilaterally without wheezes or crackles Cardiovascular: Regular rate and rhythm without murmur gallop or rub normal S1 and S2 Abdomen: Nontender, distended, hard to palpation, hypoactive bowel sounds, no rebound, no ascites, no appreciable mass Extremities: No significant cyanosis, clubbing, or edema bilateral lower extremities, 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 Bilateral contracture of hands, moves hands today spontaneously Psychiatric: Negative depression, negative anxiety, negative fatigue, negative mania  Neurologic: Cranial nerves II through XII intact, all extremities muscle strength 0/5,        Data Reviewed: Basic Metabolic Panel:  Recent Labs Lab 06/19/14 1815 06/19/14 2305 06/21/14 0520 06/22/14 0526  NA 144 144 145 136  K 4.3 3.9 3.6 4.8  CL 104 109 114* 103  CO2 31 28 26 26   GLUCOSE 102* 99 121* 159*  BUN 20 18 12  21*  CREATININE 0.71 0.62 0.58* 1.00  CALCIUM 8.8* 7.6* 8.4* 8.4*   Liver Function Tests:  Recent Labs Lab 06/19/14 1815 06/19/14 2305 06/21/14 0520 06/22/14 0526  AST 23 17 18 17   ALT 38 30 33 11*  ALKPHOS 82 66 67 42  BILITOT 1.2 1.1 1.9*  0.5  PROT 6.3* 5.1* 5.8* 5.4*  ALBUMIN 3.2* 2.7* 2.7* 2.9*   No results for input(s): LIPASE, AMYLASE in the last 168 hours. No results for input(s): AMMONIA in the last 168 hours. CBC:  Recent Labs Lab 06/19/14 1815 06/19/14 2305 06/20/14 0900 06/21/14 0520 06/22/14 0526  WBC 1.6* 1.4* 1.2* 1.1* 8.5  NEUTROABS 0.7*  --   --  0.4* 5.9  HGB 7.7* 6.4* 7.1* 9.3* 8.9*  HCT 25.4* 20.4* 22.7* 29.5* 28.0*  MCV 106.7* 107.4* 101.8* 99.7 97.2  PLT 190 174 126* 139* 166   Cardiac Enzymes: No results for input(s): CKTOTAL, CKMB, CKMBINDEX, TROPONINI in the last 168 hours. BNP (last 3 results) No results for input(s): BNP in the last 8760 hours.  ProBNP (last 3 results) No results for input(s): PROBNP in the last 8760 hours.  CBG:  Recent Labs Lab 06/22/14 0426 06/22/14 0809 06/22/14 1147 06/22/14 1648 06/22/14 1925  GLUCAP 97 99 107* 94 104*    Recent Results (from the past 240 hour(s))  Urine culture     Status: None   Collection Time: 06/19/14  6:47 PM  Result Value Ref Range Status   Specimen Description URINE, CATHETERIZED  Final   Special Requests NONE  Final   Colony Count NO GROWTH Performed at [facility]   Final  Culture NO GROWTH Performed at [facility]   Final   Report Status 06/20/2014 FINAL  Final  Blood Culture (routine x 2)     Status: None (Preliminary result)   Collection Time: 06/19/14  7:10 PM  Result Value Ref Range Status   Specimen Description BLOOD LEFT HAND  Final   Special Requests BOTTLES DRAWN AEROBIC AND ANAEROBIC 3ML  Final   Culture   Final           BLOOD CULTURE RECEIVED NO GROWTH TO DATE CULTURE WILL BE HELD FOR 5 DAYS BEFORE ISSUING A FINAL NEGATIVE REPORT Performed at [facility]    Report Status PENDING  Incomplete  Blood Culture (routine x 2)     Status: None (Preliminary result)   Collection Time: 06/19/14  7:20 PM  Result Value Ref Range Status   Specimen Description BLOOD LEFT HAND  Final    Special Requests BOTTLES DRAWN AEROBIC AND ANAEROBIC 5CC  Final   Culture   Final           BLOOD CULTURE RECEIVED NO GROWTH TO DATE CULTURE WILL BE HELD FOR 5 DAYS BEFORE ISSUING A FINAL NEGATIVE REPORT Performed at [facility]    Report Status PENDING  Incomplete  MRSA PCR Screening     Status: None   Collection Time: 06/19/14 11:48 PM  Result Value Ref Range Status   MRSA by PCR NEGATIVE NEGATIVE Final    Comment:        The GeneXpert MRSA Assay (FDA approved for NASAL specimens only), is one component of a comprehensive MRSA colonization surveillance program. It is not intended to diagnose MRSA infection nor to guide or monitor treatment for MRSA infections.   Culture, blood (routine x 2)     Status: None (Preliminary result)   Collection Time: 06/20/14  5:43 PM  Result Value Ref Range Status   Specimen Description BLOOD PORTA CATH  Final   Special Requests BOTTLES DRAWN AEROBIC AND ANAEROBIC 5ML  Final   Culture   Final           BLOOD CULTURE RECEIVED NO GROWTH TO DATE CULTURE WILL BE HELD FOR 5 DAYS BEFORE ISSUING A FINAL NEGATIVE REPORT Performed at [facility]    Report Status PENDING  Incomplete  Culture, blood (single)     Status: None (Preliminary result)   Collection Time: 06/21/14  5:20 AM  Result Value Ref Range Status   Specimen Description BLOOD LEFT HAND  Final   Special Requests BOTTLES DRAWN AEROBIC ONLY 5CC  Final   Culture   Final           BLOOD CULTURE RECEIVED NO GROWTH TO DATE CULTURE WILL BE HELD FOR 5 DAYS BEFORE ISSUING A FINAL NEGATIVE REPORT Performed at [facility]    Report Status PENDING  Incomplete     Studies:  Recent x-ray studies have been reviewed in detail by the Attending Physician  Scheduled Meds:  Scheduled Meds: . sodium chloride   Intravenous Once  . sodium chloride   Intravenous Once  . antiseptic oral rinse  7 mL Mouth Rinse q12n4p  . bacitracin   Topical BID  . calcium carbonate (dosed in  mg elemental calcium)  1,250 mg of elemental calcium Per Tube TID  . ceFEPime (MAXIPIME) IV  1 g Intravenous 3 times per day  . chlorhexidine  15 mL Mouth Rinse BID  . cloBAZam  6.25 mg Per Tube TID  . dicyclomine  10 mg Per Tube TID AC  . folic acid  1 mg Per Tube Daily  . free water  172 mL Per Tube TID AC & HS  . gabapentin  900 mg Per Tube TID  . heparin  5,000 Units Subcutaneous 3 times per day  . levalbuterol  0.63 mg Nebulization Q6H  . lidocaine  15 mL Mouth/Throat Once  . multivitamin  10 mL Per Tube Daily  . pantoprazole sodium  40 mg Per Tube BID  . potassium chloride  20 mEq Per Tube Daily  . pseudoephedrine  90 mg Per Tube 3 times per day  . simethicone  80 mg Oral TID AC & HS  . sodium chloride  3 mL Intravenous Q12H  . sucralfate  1 g Per Tube TID AC & HS  . vancomycin  750 mg Intravenous Q8H  . vitamin C  250 mg Per Tube Daily  . zinc oxide  1 application Topical 5 X Daily    Time spent on care of this patient: 40 mins   WOODS, Geraldo Docker , MD  [facility] Office  [phone] Pager [phone]  On-Call/Text Page:      [email or web address]      password [email or web address]  If 7PM-7AM, please contact night-coverage [email or web address] Password [email or web address] 06/22/2014, 9:23 PM   LOS: 3 days   Care during the described time interval was provided by me .  I have reviewed this patient's available data, including medical history, events of note, physical examination, radiology studies and test results as part of my evaluation  Dia Crawford, MD [phone] Pager

## 2014-06-22 NOTE — Progress Notes (Signed)
NTS patient: suctioned moderate amount of tan, thick secretions. Patient tolerated procedure fairly well: RR increased to 38.  Sp02 remained 98%.

## 2014-06-23 DIAGNOSIS — G808 Other cerebral palsy: Secondary | ICD-10-CM | POA: Diagnosis present

## 2014-06-23 LAB — CBC WITH DIFFERENTIAL/PLATELET
Basophils Absolute: 0 10*3/uL (ref 0.0–0.1)
Basophils Relative: 1 % (ref 0–1)
EOS ABS: 0.2 10*3/uL (ref 0.0–0.7)
Eosinophils Relative: 6 % — ABNORMAL HIGH (ref 0–5)
HCT: 25.2 % — ABNORMAL LOW (ref 39.0–52.0)
HEMOGLOBIN: 8 g/dL — AB (ref 13.0–17.0)
LYMPHS ABS: 0.4 10*3/uL — AB (ref 0.7–4.0)
Lymphocytes Relative: 11 % — ABNORMAL LOW (ref 12–46)
MCH: 31.4 pg (ref 26.0–34.0)
MCHC: 31.7 g/dL (ref 30.0–36.0)
MCV: 98.8 fL (ref 78.0–100.0)
MONOS PCT: 11 % (ref 3–12)
Monocytes Absolute: 0.4 10*3/uL (ref 0.1–1.0)
NEUTROS PCT: 72 % (ref 43–77)
Neutro Abs: 2.5 10*3/uL (ref 1.7–7.7)
Platelets: 113 10*3/uL — ABNORMAL LOW (ref 150–400)
RBC: 2.55 MIL/uL — AB (ref 4.22–5.81)
RDW: 18.1 % — AB (ref 11.5–15.5)
WBC: 3.5 10*3/uL — AB (ref 4.0–10.5)

## 2014-06-23 LAB — COMPREHENSIVE METABOLIC PANEL
ALT: 23 U/L (ref 17–63)
AST: 19 U/L (ref 15–41)
Albumin: 2.1 g/dL — ABNORMAL LOW (ref 3.5–5.0)
Alkaline Phosphatase: 72 U/L (ref 38–126)
Anion gap: 9 (ref 5–15)
BILIRUBIN TOTAL: 0.8 mg/dL (ref 0.3–1.2)
BUN: 8 mg/dL (ref 6–20)
CHLORIDE: 100 mmol/L — AB (ref 101–111)
CO2: 25 mmol/L (ref 22–32)
Calcium: 8 mg/dL — ABNORMAL LOW (ref 8.9–10.3)
Creatinine, Ser: 0.45 mg/dL — ABNORMAL LOW (ref 0.61–1.24)
GFR calc non Af Amer: >60 mL/min (ref >60)
GLUCOSE: 121 mg/dL — AB (ref 65–99)
POTASSIUM: 2.8 mmol/L — AB (ref 3.5–5.1)
Sodium: 134 mmol/L — ABNORMAL LOW (ref 135–145)
TOTAL PROTEIN: 4.8 g/dL — AB (ref 6.5–8.1)

## 2014-06-23 LAB — POTASSIUM
POTASSIUM: 4.1 mmol/L (ref 3.5–5.1)
Potassium: 3.9 mmol/L (ref 3.5–5.1)

## 2014-06-23 LAB — GLUCOSE, CAPILLARY
GLUCOSE-CAPILLARY: 100 mg/dL — AB (ref 65–99)
GLUCOSE-CAPILLARY: 98 mg/dL (ref 65–99)
GLUCOSE-CAPILLARY: 115 mg/dL — AB (ref 65–99)
Glucose-Capillary: 95 mg/dL (ref 65–99)
Glucose-Capillary: 98 mg/dL (ref 65–99)
Glucose-Capillary: 120 mg/dL — ABNORMAL HIGH (ref 65–99)
Glucose-Capillary: 135 mg/dL — ABNORMAL HIGH (ref 65–99)

## 2014-06-23 LAB — LACTATE DEHYDROGENASE: LDH: 135 U/L (ref 98–192)

## 2014-06-23 LAB — RETICULOCYTES
RBC.: 3.18 MIL/uL — ABNORMAL LOW (ref 4.22–5.81)
Retic Count, Absolute: 44.5 10*3/uL (ref 19.0–186.0)
Retic Ct Pct: 1.4 % (ref 0.4–3.1)

## 2014-06-23 LAB — CLOSTRIDIUM DIFFICILE BY PCR: Toxigenic C. Difficile by PCR: NEGATIVE

## 2014-06-23 LAB — MAGNESIUM: Magnesium: 1.8 mg/dL (ref 1.7–2.4)

## 2014-06-23 LAB — SEDIMENTATION RATE: Sed Rate: 40 mm/hr — ABNORMAL HIGH (ref 0–16)

## 2014-06-23 LAB — VANCOMYCIN, TROUGH: Vancomycin Tr: 16 ug/mL (ref 10.0–20.0)

## 2014-06-23 MED ORDER — SODIUM CHLORIDE 0.9 % IV SOLN: 1000.0000 mg | Freq: Once | INTRAVENOUS | Status: DC

## 2014-06-23 MED ORDER — SODIUM CHLORIDE 0.9 % IV SOLN
40.0000 mg | Freq: Once | INTRAVENOUS | Status: AC
  Administered 2014-06-23: 40 mg via INTRAVENOUS
  Filled 2014-06-23 (×2): qty 4

## 2014-06-23 MED ORDER — DIPHENOXYLATE-ATROPINE 2.5-0.025 MG/5ML PO LIQD
5.0000 mL | Freq: Four times a day (QID) | ORAL | Status: DC | PRN
  Administered 2014-06-23 – 2014-06-24 (×2): 5 mL via ORAL
  Filled 2014-06-23 (×2): qty 5

## 2014-06-23 MED ORDER — METHYLPREDNISOLONE SODIUM SUCC 125 MG IJ SOLR
125.0000 mg | Freq: Once | INTRAMUSCULAR | Status: AC
  Administered 2014-06-23: 125 mg via INTRAVENOUS
  Filled 2014-06-23: qty 2

## 2014-06-23 MED ORDER — METHYLPREDNISOLONE SODIUM SUCC 125 MG IJ SOLR
60.0000 mg | INTRAMUSCULAR | Status: DC
  Administered 2014-06-24 – 2014-06-27 (×4): 60 mg via INTRAVENOUS
  Filled 2014-06-23 (×4): qty 0.96
  Filled 2014-06-23: qty 2

## 2014-06-23 MED ORDER — SODIUM CHLORIDE 0.9 % IV SOLN
25.0000 mg | Freq: Once | INTRAVENOUS | Status: AC
  Administered 2014-06-23: 25 mg via INTRAVENOUS
  Filled 2014-06-23: qty 0.5

## 2014-06-23 MED ORDER — SODIUM CHLORIDE 0.9 % IV SOLN
1000.0000 mg | Freq: Once | INTRAVENOUS | Status: AC
  Administered 2014-06-23: 1000 mg via INTRAVENOUS
  Filled 2014-06-23 (×2): qty 20

## 2014-06-23 MED ORDER — FUROSEMIDE 10 MG/ML IJ SOLN
40.0000 mg | Freq: Once | INTRAMUSCULAR | Status: AC
  Administered 2014-06-23: 40 mg via INTRAVENOUS
  Filled 2014-06-23: qty 4

## 2014-06-23 MED ORDER — POTASSIUM CHLORIDE 20 MEQ/15ML (10%) PO SOLN
30.0000 meq | Freq: Every day | ORAL | Status: DC
  Administered 2014-06-23 – 2014-06-24 (×2): 30 meq
  Filled 2014-06-23 (×2): qty 22.5

## 2014-06-23 NOTE — Progress Notes (Signed)
[place] TEAM 1 - Stepdown/ICU TEAM Progress Note  Jeffery [name] SLH:[id number] DOB: Oct 15, 1990 DOA: 06/19/2014 PCP: Jeffery Bravo, MD  Admit HPI / Brief Narrative: Jeffery [name] is a 24 y.o. WM PMHx  cerebral palsy, spastic quadriplegia, wheelchair mobile, seizure disorder, has Port-A-Cath, PEG tube, hx of pancytopenia attributed to Keppra which was discontinued, OSA on BiPAP who presents with fever and cough.  Per patient's foster mother, patient has been having fever in the past 4 days. His temperature was 101.8 at one time point. Patient has mild cough with clear and thick sputum production. No dysuria, diarrhea, abdominal pain per caregiver. He was treated with Tylenol and ibuprofen by foster mother with some help.   In ED, patient was found to have WBC 1.6, negative urinalysis, lactate 1.48, temperature 104.1, tachycardia, electrolytes okay. chest x-ray is negative for acute abnormalities. Patient is admitted to inpatient for further evaluation treatment.  HPI/Subjective: 5/22 alert, eyes open will track you around the room with his eyes , moving his arms   Assessment/Plan: Fever and sepsis due to unspecified organism/neutropenic fever:  -Source of infection is not clear. Patient rose to 8.5 after first Neupogen administered, now starting to trend down. -Jeffery [name] (oncology) and parent trying to decide if bone marrow biopsy worth risk. - Continue D5W-0.9% saline 88m/hr + D10 @ 75 ml/hr -Tmax overnight= 38.3C - Mucinex for cough  - Xopenex Neb QID  -DuoNeb q 4hr PRN -All cultures negative to date see results below   Acute on chronic respiratory failure -See sepsis due to unspecified organism -Titrate O2 to maintain SPO2> 93% -Continue BiPAP as needed to maintain SPO2> 93%  Pancytopenia:  -This was noticed in early February. Hematology, Dr. EMarin Olpfollowing and felt that this was related to KHalburwhich was discontinued. -follow up by CBC, transfuse as  needed -Clobazam (Onfi) can cause,    -5/19 Transfuse 1 unit PRBC -Received one dose Neupogen 480 g per oncology with significant increase in WBCs initially now trending down. Awaiting further recommendations from Dr. EMarin [name](oncology).  Bowel gas (ileus?) -Continue Benytyl + Fleet enema PRN to alleviate bowel gas.   Sizure disorder:  -Patient is allergic to several common seizure medications.  -Currently he is on Clobazam (Onfi) 6.25 mg TID  -PRN Diastat Acudial  OSA:  -Continue nightly BIPAP  Cerebral palsy/congenital spastic quadriplegia/baclofen pump: -continue Baclofen pump  Dysphagia,s/p PEG tube:  -Nutritionist consulted for management.  Quadriplegia -KinAir bed,    Code Status: FULL Family Communication: Mother present at time of exam Disposition Plan: Resolution sepsis    Consultants: Dr. EMarin [name](oncology)    Procedure/Significant Events: 5/19 Transfuse 1 unit PRBC 5/20 echocardiogram;- LVEF=55%-60%. -A patent foramen ovale cannot be excluded. - Pericardium, extracardiac: Prominant epicardial fat pad; small pericardial effusion? 5/20 portable abdominal x-ray; Gas is demonstrated within nondilated loops of large and small bowel in a nonobstructed pattern   Culture 5/18 urine negative 5/18 blood left hand 2 NGTD 5/18 MRSA by PCR negative 5/19 legionella urine antigen negative 5/19 strep pneumo urine antigen negative 5/19 blood Porta cath NGTD 5/20 blood left hand NGTD 5/22 C. difficile by PCR negative   Antibiotics: Cefepime 5/19>> stopped 5/22 by oncology Vancomycin 5/18>> stopped 522 by oncology   DVT prophylaxis: Heparin subcutaneous   Devices    LINES / TUBES:      Continuous Infusions: . baclofen    . dextrose 75 mL/hr at 06/22/14 1900  . dextrose 5 % and 0.9% NaCl 75 mL/hr at 06/22/14  1900    Objective: VITAL SIGNS: Temp: 99.5 F (37.5 C) (05/22 1919) Temp Source: Rectal (05/22 1919) BP: 117/72 mmHg (05/22  1919) Pulse Rate: 90 (05/22 1919) SPO2; FIO2:   Intake/Output Summary (Last 24 hours) at 06/23/14 2036 Last data filed at 06/23/14 2000  Gross per 24 hour  Intake   3150 ml  Output   1201 ml  Net   1949 ml     Exam: General: alert, eyes open will track you around the room with his eyes, No acute respiratory distress Eyes: Negative retinal hemorrhage, pupils equal react to light and accommodation ENT: Negative Runny nose   Neck: Negative scars, masses, torticollis, lymphadenopathy, JVD Lungs: positive bilaterally wheezing or crackles Cardiovascular: Regular rate and rhythm without murmur gallop or rub normal S1 and S2 Abdomen: Nontender, distended, hard to palpation, hypoactive bowel sounds, no rebound, no ascites, no appreciable mass Extremities: No significant cyanosis, clubbing, or edema bilateral lower extremities, 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 Bilateral contracture of hands, moves hands today spontaneously Psychiatric: Negative depression, negative anxiety, negative fatigue, negative mania  Neurologic: Cranial nerves II through XII intact, all extremities muscle strength 0/5,        Data Reviewed: Basic Metabolic Panel:  Recent Labs Lab 06/19/14 1815 06/19/14 2305 06/21/14 0520 06/22/14 0526 06/23/14 0545 06/23/14 1200 06/23/14 1355  NA 144 144 145 136 134*  --   --   K 4.3 3.9 3.6 4.8 2.8* 4.1 3.9  CL 104 109 114* 103 100*  --   --   CO2 _0 --   --   GLUCOSE 102* 99 121* 159* 121*  --   --   BUN _1 21* 8  --   --   CREATININE 0.71 0.62 0.58* 1.00 0.45*  --   --   CALCIUM 8.8* 7.6* 8.4* 8.4* 8.0*  --   --   MG  --   --   --   --   --   --  1.8   Liver Function Tests:  Recent Labs Lab 06/19/14 1815 06/19/14 2305 06/21/14 0520 06/22/14 0526 06/23/14 0545  AST _2 ALT 38 30 33 11* 23  ALKPHOS 82 66 67 42 72  BILITOT 1.2 1.1 1.9*  0.5 0.8  PROT 6.3* 5.1* 5.8* 5.4* 4.8*  ALBUMIN 3.2* 2.7* 2.7* 2.9* 2.1*   No results for input(s): LIPASE, AMYLASE in the last 168 hours. No results for input(s): AMMONIA in the last 168 hours. CBC:  Recent Labs Lab 06/19/14 1815 06/19/14 2305 06/20/14 0900 06/21/14 0520 06/22/14 0526 06/23/14 0545  WBC 1.6* 1.4* 1.2* 1.1* 8.5 3.5*  NEUTROABS 0.7*  --   --  0.4* 5.9 2.5  HGB 7.7* 6.4* 7.1* 9.3* 8.9* 8.0*  HCT 25.4* 20.4* 22.7* 29.5* 28.0* 25.2*  MCV 106.7* 107.4* 101.8* 99.7 97.2 98.8  PLT 190 174 126* 139* 166 113*   Cardiac Enzymes: No results for input(s): CKTOTAL, CKMB, CKMBINDEX, TROPONINI in the last 168 hours. BNP (last 3 results) No results for input(s): BNP in the last 8760 hours.  ProBNP (last 3 results) No results for input(s): PROBNP in the last 8760 hours.  CBG:  Recent Labs Lab 06/23/14 0332 06/23/14 0754 06/23/14 1144 06/23/14 1607 06/23/14 1922  GLUCAP 100* 98 98 115* 120*    Recent Results (from the past 240 hour(s))  Urine culture  Status: None   Collection Time: 06/19/14  6:47 PM  Result Value Ref Range Status   Specimen Description URINE, CATHETERIZED  Final   Special Requests NONE  Final   Colony Count NO GROWTH Performed at [facility]   Final   Culture NO GROWTH Performed at [facility]   Final   Report Status 06/20/2014 FINAL  Final  Blood Culture (routine x 2)     Status: None (Preliminary result)   Collection Time: 06/19/14  7:10 PM  Result Value Ref Range Status   Specimen Description BLOOD LEFT HAND  Final   Special Requests BOTTLES DRAWN AEROBIC AND ANAEROBIC 3ML  Final   Culture   Final           BLOOD CULTURE RECEIVED NO GROWTH TO DATE CULTURE WILL BE HELD FOR 5 DAYS BEFORE ISSUING A FINAL NEGATIVE REPORT Performed at [facility]    Report Status PENDING  Incomplete  Blood Culture (routine x 2)     Status: None (Preliminary result)   Collection Time: 06/19/14  7:20 PM  Result Value Ref  Range Status   Specimen Description BLOOD LEFT HAND  Final   Special Requests BOTTLES DRAWN AEROBIC AND ANAEROBIC 5CC  Final   Culture   Final           BLOOD CULTURE RECEIVED NO GROWTH TO DATE CULTURE WILL BE HELD FOR 5 DAYS BEFORE ISSUING A FINAL NEGATIVE REPORT Performed at [facility]    Report Status PENDING  Incomplete  MRSA PCR Screening     Status: None   Collection Time: 06/19/14 11:48 PM  Result Value Ref Range Status   MRSA by PCR NEGATIVE NEGATIVE Final    Comment:        The GeneXpert MRSA Assay (FDA approved for NASAL specimens only), is one component of a comprehensive MRSA colonization surveillance program. It is not intended to diagnose MRSA infection nor to guide or monitor treatment for MRSA infections.   Culture, blood (routine x 2)     Status: None (Preliminary result)   Collection Time: 06/20/14  5:43 PM  Result Value Ref Range Status   Specimen Description BLOOD PORTA CATH  Final   Special Requests BOTTLES DRAWN AEROBIC AND ANAEROBIC 5ML  Final   Culture   Final           BLOOD CULTURE RECEIVED NO GROWTH TO DATE CULTURE WILL BE HELD FOR 5 DAYS BEFORE ISSUING A FINAL NEGATIVE REPORT Performed at [facility]    Report Status PENDING  Incomplete  Culture, blood (single)     Status: None (Preliminary result)   Collection Time: 06/21/14  5:20 AM  Result Value Ref Range Status   Specimen Description BLOOD LEFT HAND  Final   Special Requests BOTTLES DRAWN AEROBIC ONLY 5CC  Final   Culture   Final           BLOOD CULTURE RECEIVED NO GROWTH TO DATE CULTURE WILL BE HELD FOR 5 DAYS BEFORE ISSUING A FINAL NEGATIVE REPORT Performed at [facility]    Report Status PENDING  Incomplete  Clostridium Difficile by PCR     Status: None   Collection Time: 06/23/14 12:01 PM  Result Value Ref Range Status   C difficile by pcr NEGATIVE NEGATIVE Final     Studies:  Recent x-ray studies have been reviewed in detail by the Attending  Physician  Scheduled Meds:  Scheduled Meds: . sodium chloride   Intravenous  Once  . sodium chloride   Intravenous Once  . antiseptic oral rinse  7 mL Mouth Rinse q12n4p  . bacitracin   Topical BID  . calcium carbonate (dosed in mg elemental calcium)  1,250 mg of elemental calcium Per Tube TID  . cloBAZam  6.25 mg Per Tube TID  . dicyclomine  10 mg Per Tube TID AC  . folic acid  1 mg Per Tube Daily  . free water  172 mL Per Tube TID AC & HS  . gabapentin  900 mg Per Tube TID  . heparin  5,000 Units Subcutaneous 3 times per day  . levalbuterol  0.63 mg Nebulization Q6H  . lidocaine  15 mL Mouth/Throat Once  . multivitamin  10 mL Per Tube Daily  . pantoprazole sodium  40 mg Per Tube BID  . potassium chloride  20 mEq Per Tube Daily  . potassium chloride  30 mEq Per Tube Daily  . pseudoephedrine  90 mg Per Tube 3 times per day  . simethicone  80 mg Oral TID AC & HS  . sodium chloride  3 mL Intravenous Q12H  . sucralfate  1 g Per Tube TID AC & HS  . vitamin C  250 mg Per Tube Daily  . zinc oxide  1 application Topical 5 X Daily    Time spent on care of this patient: 40 mins   WOODS, Geraldo Docker , MD  [facility] Office  [phone] Pager [phone]  On-Call/Text Page:      [email or web address]      password [email or web address]  If 7PM-7AM, please contact night-coverage [email or web address] Password [facility] 06/23/2014, 8:36 PM   LOS: 4 days   Care during the described time interval was provided by me .  I have reviewed this patient's available data, including medical history, events of note, physical examination, radiology studies and test results as part of my evaluation  Dia Crawford, MD [phone] Pager

## 2014-06-23 NOTE — Progress Notes (Signed)
UR COMPLETED  

## 2014-06-23 NOTE — Progress Notes (Signed)
Jeffery [name] is about the same. He is still on antibiotics. His cultures are all negative. I plywood consider stopping the antibiotics.  His white cell count is started trending back down. His neutrophil count is still okay.  His hemoglobin is dropping. Again I don't know if he is bleeding. He is definitely iron deficient. I will give him a dose of IV iron. His erythropoietin level that we checked back in February was 1100 so I don't think he would be a candidate for ESA.  His potassium is quite low. This probably needs to be replaced.  There is nothing on his blood smear that looks unusual.  I, as much as I don't want to, think that he may need to have a bone marrow test done. I really would hate to have him go through this. It would be incredibly difficult to do. However, it's not clear at all as to why his blood counts are dropping. I just don't know if it's his seizure medication. I will have to talk to his mom about this.  I want to give him IV iron with iron and dextran. I can give him a low bit of a higher dose.  I will get a reticulocyte him. I would think that he is hemolyzing. Again I worry about blood loss. His stools are not yet been checked.  There really is no change in his physical exam. He's not had any fever. His blood pressure is stable.  There really is no change in his overall physical exam.  It is still is very puzzling as to why his blood counts are dropping. We just cannot keep transfusing him. Again, a bone marrow biopsy would be a big undertaking for him given his overall status.  Frederich Cha 1:5-7

## 2014-06-24 ENCOUNTER — Other Ambulatory Visit: Payer: Self-pay | Admitting: Radiology

## 2014-06-24 ENCOUNTER — Encounter (HOSPITAL_COMMUNITY): Payer: Self-pay | Admitting: Radiology

## 2014-06-24 DIAGNOSIS — D61818 Other pancytopenia: Secondary | ICD-10-CM | POA: Diagnosis present

## 2014-06-24 DIAGNOSIS — G808 Other cerebral palsy: Secondary | ICD-10-CM

## 2014-06-24 DIAGNOSIS — J9621 Acute and chronic respiratory failure with hypoxia: Secondary | ICD-10-CM

## 2014-06-24 LAB — CBC WITH DIFFERENTIAL/PLATELET
Basophils Absolute: 0 10*3/uL (ref 0.0–0.1)
Basophils Relative: 0 % (ref 0–1)
EOS PCT: 2 % (ref 0–5)
Eosinophils Absolute: 0 10*3/uL (ref 0.0–0.7)
HCT: 28.3 % — ABNORMAL LOW (ref 39.0–52.0)
HEMOGLOBIN: 9.2 g/dL — AB (ref 13.0–17.0)
LYMPHS ABS: 0.5 10*3/uL — AB (ref 0.7–4.0)
LYMPHS PCT: 20 % (ref 12–46)
MCH: 31.9 pg (ref 26.0–34.0)
MCHC: 32.5 g/dL (ref 30.0–36.0)
MCV: 98.3 fL (ref 78.0–100.0)
MONO ABS: 0.6 10*3/uL (ref 0.1–1.0)
Monocytes Relative: 22 % — ABNORMAL HIGH (ref 3–12)
NEUTROS ABS: 1.4 10*3/uL — AB (ref 1.7–7.7)
NEUTROS PCT: 56 % (ref 43–77)
Platelets: 121 10*3/uL — ABNORMAL LOW (ref 150–400)
RBC: 2.88 MIL/uL — ABNORMAL LOW (ref 4.22–5.81)
RDW: 17.7 % — AB (ref 11.5–15.5)
WBC: 2.5 10*3/uL — AB (ref 4.0–10.5)

## 2014-06-24 LAB — COMPREHENSIVE METABOLIC PANEL
ALK PHOS: 91 U/L (ref 38–126)
ALT: 36 U/L (ref 17–63)
ANION GAP: 8 (ref 5–15)
AST: 27 U/L (ref 15–41)
Albumin: 2.4 g/dL — ABNORMAL LOW (ref 3.5–5.0)
CALCIUM: 8.4 mg/dL — AB (ref 8.9–10.3)
CO2: 27 mmol/L (ref 22–32)
CREATININE: 0.44 mg/dL — AB (ref 0.61–1.24)
Chloride: 99 mmol/L — ABNORMAL LOW (ref 101–111)
GFR calc Af Amer: >60 mL/min (ref >60)
GFR calc non Af Amer: >60 mL/min (ref >60)
Glucose, Bld: 101 mg/dL — ABNORMAL HIGH (ref 65–99)
Potassium: 3 mmol/L — ABNORMAL LOW (ref 3.5–5.1)
Sodium: 134 mmol/L — ABNORMAL LOW (ref 135–145)
TOTAL PROTEIN: 5.1 g/dL — AB (ref 6.5–8.1)
Total Bilirubin: 1.1 mg/dL (ref 0.3–1.2)

## 2014-06-24 LAB — GLUCOSE, CAPILLARY
GLUCOSE-CAPILLARY: 117 mg/dL — AB (ref 65–99)
GLUCOSE-CAPILLARY: 115 mg/dL — AB (ref 65–99)
Glucose-Capillary: 113 mg/dL — ABNORMAL HIGH (ref 65–99)
Glucose-Capillary: 118 mg/dL — ABNORMAL HIGH (ref 65–99)
Glucose-Capillary: 83 mg/dL (ref 65–99)
Glucose-Capillary: 82 mg/dL (ref 65–99)
Glucose-Capillary: 81 mg/dL (ref 65–99)

## 2014-06-24 LAB — PROTIME-INR
INR: 1.19 (ref 0.00–1.49)
PROTHROMBIN TIME: 15.3 s — AB (ref 11.6–15.2)

## 2014-06-24 LAB — MAGNESIUM: MAGNESIUM: 1.9 mg/dL (ref 1.7–2.4)

## 2014-06-24 LAB — APTT: APTT: 23 s — AB (ref 24–37)

## 2014-06-24 MED ORDER — TWOCAL HN PO LIQD
552.0000 mL | ORAL | Status: DC
  Filled 2014-06-24: qty 711

## 2014-06-24 MED ORDER — FUROSEMIDE 10 MG/ML IJ SOLN
40.0000 mg | Freq: Once | INTRAMUSCULAR | Status: AC
  Administered 2014-06-24: 40 mg via INTRAVENOUS
  Filled 2014-06-24: qty 4

## 2014-06-24 MED ORDER — ACETAMINOPHEN 160 MG/5ML PO SOLN
650.0000 mg | Freq: Four times a day (QID) | ORAL | Status: DC | PRN
  Administered 2014-06-24 – 2014-06-26 (×2): 650 mg
  Filled 2014-06-24 (×2): qty 20.3

## 2014-06-24 MED ORDER — HEPARIN SODIUM (PORCINE) 5000 UNIT/ML IJ SOLN
5000.0000 [IU] | Freq: Three times a day (TID) | INTRAMUSCULAR | Status: DC
  Administered 2014-06-24 – 2014-06-27 (×9): 5000 [IU] via SUBCUTANEOUS
  Filled 2014-06-24 (×10): qty 1

## 2014-06-24 MED ORDER — POTASSIUM CHLORIDE 20 MEQ/15ML (10%) PO SOLN
40.0000 meq | Freq: Two times a day (BID) | ORAL | Status: AC
  Administered 2014-06-24 (×2): 40 meq
  Filled 2014-06-24 (×2): qty 30

## 2014-06-24 MED ORDER — MICONAZOLE NITRATE 2 % EX CREA
TOPICAL_CREAM | Freq: Two times a day (BID) | CUTANEOUS | Status: DC | PRN
  Filled 2014-06-24: qty 14

## 2014-06-24 MED ORDER — DEXTROSE 10 % IV SOLN
INTRAVENOUS | Status: DC
  Administered 2014-06-24: 22:00:00 via INTRAVENOUS

## 2014-06-24 MED ORDER — LEVALBUTEROL HCL 0.63 MG/3ML IN NEBU
0.6300 mg | INHALATION_SOLUTION | RESPIRATORY_TRACT | Status: DC | PRN
  Administered 2014-06-24 – 2014-06-27 (×6): 0.63 mg via RESPIRATORY_TRACT
  Filled 2014-06-24 (×2): qty 3

## 2014-06-24 MED ORDER — SODIUM CHLORIDE 0.9 % IV SOLN
INTRAVENOUS | Status: DC
  Administered 2014-06-24: 22:00:00 via INTRAVENOUS

## 2014-06-24 MED ORDER — POTASSIUM CHLORIDE 20 MEQ/15ML (10%) PO SOLN
20.0000 meq | Freq: Once | ORAL | Status: AC
  Administered 2014-06-24: 20 meq via ORAL
  Filled 2014-06-24: qty 15

## 2014-06-24 MED ORDER — IBUPROFEN 100 MG/5ML PO SUSP
400.0000 mg | Freq: Four times a day (QID) | ORAL | Status: DC | PRN
  Filled 2014-06-24: qty 20

## 2014-06-24 NOTE — Progress Notes (Signed)
[facility] [place] TEAM 1 - Stepdown/ICU TEAM Progress Note  Jeffery [name] JKD:[id number] DOB: [birth year] DOA: 06/19/2014 PCP: Marijean Bravo, MD  Admit HPI / Brief Narrative: 24 yo M Hx cerebral palsy, spastic quadriplegia, wheelchair mobile, seizure disorder, has Port-A-Cath, PEG tube, hx of pancytopenia attributed to Keppra which was discontinued, OSA on BiPAP who presented with fever and cough.  Per patient's foster mother patient had been having fever for 4 days. His temperature was 101.8 at one point. Patient had mild cough with clear and thick sputum production. No dysuria, diarrhea, abdominal pain per caregiver. He was treated with Tylenol and ibuprofen by foster mother with some improvement.   In ED, patient was found to have WBC 1.6, negative urinalysis, lactate 1.48, temperature 104.1, tachycardia. Chest x-ray was negative for acute abnormalities.   HPI/Subjective: The patient is alert but noncommunicative as per his baseline.  He does not appear to be in acute distress nor does he appear to be in significant pain.  I spoke with his mother at bedside at length.  She relates no new concerns at this time.  Assessment/Plan:  Fever and sepsis due to unspecified organism/neutropenic fever -Source of infection not clear - WBC rose to 8.5 after first Neupogen administered, now starting to trend down -Dr. Marin Olp following  -All cultures negative to date - empiric antibiotics discontinued - continue to follow  Acute on chronic respiratory failure -Appears to have stabilized for now with oxygen saturations at 100% on 6 L - wean oxygen as able  Pancytopenia  -initially appreciated in early February - Dr. Marin Olp following and felt that this was related to [place] which was discontinued   -5/19 Transfused 2 units PRBC -Received one dose Neupogen 480 g per Oncology with significant increase in WBCs initially  -Etiology remains unclear - oncology has ordered a bone marrow  biopsy  Hypokalemia -Supplement - magnesium okay  Ileus? -No clinical evidence of this presently - follow   Sizure disorder -allergic to several common seizure medications -currently on Clobazam (Onfi) 6.25 mg TID  OSA -Continue nightly BIPAP  Cerebral palsy/congenital spastic quadriplegia/baclofen pump -continue Baclofen pump - appears stable presently  Dysphagia s/p PEG tube  -Nutritionist consulted for management - no complications presently  Quadriplegia -KinAir bed    Code Status: FULL Family Communication: Spoke with mother at bedside at length Disposition Plan: SDU  Consultants: Dr. Marin Olp (Oncology)  Procedure/Significant Events: 5/19 Transfuse 2 units PRBC 5/20 echocardiogram EF=55%-60% - patent foramen ovale cannot be excluded. - Pericardium, extracardiac: Prominant epicardial fat pad; small pericardial effusion?  Antibiotics: Cefepime 5/19 > 5/22  Vancomycin 5/18 > 5/21   DVT prophylaxis: Heparin subcutaneous  Objective: Blood pressure 151/98, pulse 103, temperature 100 F (37.8 C), temperature source Rectal, resp. rate 23, weight 56.7 kg (125 lb), SpO2 100 %.  Intake/Output Summary (Last 24 hours) at 06/24/14 1035 Last data filed at 06/24/14 0800  Gross per 24 hour  Intake   3000 ml  Output   1351 ml  Net   1649 ml    Exam: General: Awake, tracks the examiner with eyes, noncommunicative, no acute respiratory distress Neck: No JVD Lungs: Good air movement throughout without focal crackles or wheezing Cardiovascular: Regular rate and rhythm without murmur gallop or rub normal S1 and S2 Abdomen: Nontender, distended, hypoactive bowel sounds, no rebound, no ascites, no appreciable mass - PEG insertion site clean and dry Extremities: No significant cyanosis, clubbing, or edema bilateral lower extremities, chronic bilateral contracture of bilateral upper and lower  extremities  Data Reviewed: Basic Metabolic Panel:  Recent Labs Lab  06/19/14 2305 06/21/14 0520 06/22/14 0526 06/23/14 0545 06/23/14 1200 06/23/14 1355 06/24/14 0600  NA 144 145 136 134*  --   --  134*  K 3.9 3.6 4.8 2.8* 4.1 3.9 3.0*  CL 109 114* 103 100*  --   --  99*  CO2 28 26 26 25   --   --  27  GLUCOSE 99 121* 159* 121*  --   --  101*  BUN 18 12 21* 8  --   --  <5*  CREATININE 0.62 0.58* 1.00 0.45*  --   --  0.44*  CALCIUM 7.6* 8.4* 8.4* 8.0*  --   --  8.4*  MG  --   --   --   --   --  1.8  --    Liver Function Tests:  Recent Labs Lab 06/19/14 2305 06/21/14 0520 06/22/14 0526 06/23/14 0545 06/24/14 0600  AST 17 18 17 19 27   ALT 30 33 11* 23 36  ALKPHOS 66 67 42 72 91  BILITOT 1.1 1.9* 0.5 0.8 1.1  PROT 5.1* 5.8* 5.4* 4.8* 5.1*  ALBUMIN 2.7* 2.7* 2.9* 2.1* 2.4*   CBC:  Recent Labs Lab 06/19/14 1815  06/20/14 0900 06/21/14 0520 06/22/14 0526 06/23/14 0545 06/24/14 0600  WBC 1.6*  < > 1.2* 1.1* 8.5 3.5* 2.5*  NEUTROABS 0.7*  --   --  0.4* 5.9 2.5 1.4*  HGB 7.7*  < > 7.1* 9.3* 8.9* 8.0* 9.2*  HCT 25.4*  < > 22.7* 29.5* 28.0* 25.2* 28.3*  MCV 106.7*  < > 101.8* 99.7 97.2 98.8 98.3  PLT 190  < > 126* 139* 166 113* 121*  < > = values in this interval not displayed.  CBG:  Recent Labs Lab 06/23/14 1607 06/23/14 1922 06/23/14 2246 06/24/14 0406 06/24/14 0737  GLUCAP 115* 120* 135* 113* 117*    Recent Results (from the past 240 hour(s))  Urine culture     Status: None   Collection Time: 06/19/14  6:47 PM  Result Value Ref Range Status   Specimen Description URINE, CATHETERIZED  Final   Special Requests NONE  Final   Colony Count NO GROWTH Performed at [facility]   Final   Culture NO GROWTH Performed at [facility]   Final   Report Status 06/20/2014 FINAL  Final  Blood Culture (routine x 2)     Status: None (Preliminary result)   Collection Time: 06/19/14  7:10 PM  Result Value Ref Range Status   Specimen Description BLOOD LEFT HAND  Final   Special Requests BOTTLES DRAWN AEROBIC AND  ANAEROBIC 3ML  Final   Culture   Final           BLOOD CULTURE RECEIVED NO GROWTH TO DATE CULTURE WILL BE HELD FOR 5 DAYS BEFORE ISSUING A FINAL NEGATIVE REPORT Performed at [facility]    Report Status PENDING  Incomplete  Blood Culture (routine x 2)     Status: None (Preliminary result)   Collection Time: 06/19/14  7:20 PM  Result Value Ref Range Status   Specimen Description BLOOD LEFT HAND  Final   Special Requests BOTTLES DRAWN AEROBIC AND ANAEROBIC 5CC  Final   Culture   Final           BLOOD CULTURE RECEIVED NO GROWTH TO DATE CULTURE WILL BE HELD FOR 5 DAYS BEFORE ISSUING A FINAL NEGATIVE REPORT Performed at [facility]    Report Status PENDING  Incomplete  MRSA PCR Screening     Status: None   Collection Time: 06/19/14 11:48 PM  Result Value Ref Range Status   MRSA by PCR NEGATIVE NEGATIVE Final    Comment:        The GeneXpert MRSA Assay (FDA approved for NASAL specimens only), is one component of a comprehensive MRSA colonization surveillance program. It is not intended to diagnose MRSA infection nor to guide or monitor treatment for MRSA infections.   Culture, blood (routine x 2)     Status: None (Preliminary result)   Collection Time: 06/20/14  5:43 PM  Result Value Ref Range Status   Specimen Description BLOOD PORTA CATH  Final   Special Requests BOTTLES DRAWN AEROBIC AND ANAEROBIC 5ML  Final   Culture   Final           BLOOD CULTURE RECEIVED NO GROWTH TO DATE CULTURE WILL BE HELD FOR 5 DAYS BEFORE ISSUING A FINAL NEGATIVE REPORT Performed at [facility]    Report Status PENDING  Incomplete  Culture, blood (single)     Status: None (Preliminary result)   Collection Time: 06/21/14  5:20 AM  Result Value Ref Range Status   Specimen Description BLOOD LEFT HAND  Final   Special Requests BOTTLES DRAWN AEROBIC ONLY 5CC  Final   Culture   Final           BLOOD CULTURE RECEIVED NO GROWTH TO DATE CULTURE WILL BE HELD FOR 5 DAYS BEFORE  ISSUING A FINAL NEGATIVE REPORT Performed at [facility]    Report Status PENDING  Incomplete  Clostridium Difficile by PCR     Status: None   Collection Time: 06/23/14 12:01 PM  Result Value Ref Range Status   C difficile by pcr NEGATIVE NEGATIVE Final     Studies:  Recent x-ray studies have been reviewed in detail by the Attending Physician  Scheduled Meds:  Scheduled Meds: . sodium chloride   Intravenous Once  . sodium chloride   Intravenous Once  . antiseptic oral rinse  7 mL Mouth Rinse q12n4p  . bacitracin   Topical BID  . calcium carbonate (dosed in mg elemental calcium)  1,250 mg of elemental calcium Per Tube TID  . cloBAZam  6.25 mg Per Tube TID  . dicyclomine  10 mg Per Tube TID AC  . folic acid  1 mg Per Tube Daily  . free water  172 mL Per Tube TID AC & HS  . gabapentin  900 mg Per Tube TID  . heparin  5,000 Units Subcutaneous 3 times per day  . levalbuterol  0.63 mg Nebulization Q6H  . lidocaine  15 mL Mouth/Throat Once  . methylPREDNISolone (SOLU-MEDROL) injection  60 mg Intravenous Q24H  . multivitamin  10 mL Per Tube Daily  . pantoprazole sodium  40 mg Per Tube BID  . potassium chloride  20 mEq Per Tube Daily  . potassium chloride  30 mEq Per Tube Daily  . pseudoephedrine  90 mg Per Tube 3 times per day  . simethicone  80 mg Oral TID AC & HS  . sodium chloride  3 mL Intravenous Q12H  . sucralfate  1 g Per Tube TID AC & HS  . vitamin C  250 mg Per Tube Daily  . zinc oxide  1 application Topical 5 X Daily    Time spent on care of this patient: 35 mins  Cherene Altes, MD  [facility] For Consults/Admissions - Flow Manager [phone] Office  [phone]  Contact MD directly via text page:      [email or web address]      password [facility]  06/24/2014, 10:35 AM   LOS: 5 days

## 2014-06-24 NOTE — Consult Note (Signed)
Chief Complaint: Chief Complaint  Patient presents with  . Fever  Pancytopenia  Referring Physician(s): [name]  History of Present Illness: Jeffery [name] is a 24 y.o. male   Severe cerebral palsy Quadriplegia; wheel chair mobile Hx of pancytopenia in 03/2014; was taken off Keppra and resolved Now new pancytopenia- worsening On Onfi now for sz meds Consulted again with Dr Domenic Schwab Bone Marrow Bx now Royce Macadamia mother in room Very knowledgeable of pts condition and needs Agreeable to BM bx- but feels needs to be in room for procedure to keep pt calm and  able to tolerate procedure [name] has requested this as well    Past Medical History  Diagnosis Date  . CP (cerebral palsy), spastic, quadriplegic   . Osteoporosis   . Undescended testes   . Seasonal allergies   . IVH (intraventricular hemorrhage) 11/07/1990    Grade IV  . Hip dislocation, bilateral   . Dysphagia   . Retinopathy of prematurity   . 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 Neuromuscular scoliosis   . Osteoporosis   . Complex partial seizures   . Generalized convulsive epilepsy without mention of intractable epilepsy   . Sinus bradycardia     HR drops to 38-40 while sleeping  . Blister of right heel     fluid filled; origin unknown  . Kidney stones     ?  Marland Kitchen Pneumonia      chronic pneumonia ,respitory failure dx Augest 2014  . Aspiration pneumonia     "chronic" (04/12/2014)  . OSA treated with BiPAP     "since age 13"   . Anemia   . 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 GERD (gastroesophageal reflux disease)   . Epilepsy   . Spastic quadriplegia     Past Surgical History  Procedure Laterality Date  . Mole removal  "several B/T 2008-2010"    "from all over"  . Jejunostomy feeding tube  03/08/2013; ~ 09/2013; ~ 01/2014    "transgastric-jujunal feeding tube"  . Gastrostomy tube placement  11./02/1998       .  Button change  12/15/2010    Procedure: BUTTON CHANGE;  Surgeon: Gatha Mayer, MD;  Location: [facility];  Service: Endoscopy;  Laterality: N/A;  . Peg placement  10/07/2011    Procedure: PERCUTANEOUS ENDOSCOPIC GASTROSTOMY (PEG) REPLACEMENT;  Surgeon: Lafayette Dragon, MD;  Location: [facility];  Service: Endoscopy;  Laterality: N/A;  Needs 18 F 2.5 button ordered-dl  . Inguinal hernia repair Bilateral 1992  . Retinopathy of prematurity surgery  1992  . Achilles tendon lengthening Bilateral 12/1998  . Tendon release  12/1998    Soft tissue releases  wrists and fingers [Other]  . Infusion pump implantation  07/25/2000; 2013    Intrathecal baclofen pump  . Peg placement N/A 06/05/2012    Procedure: PERCUTANEOUS ENDOSCOPIC GASTROSTOMY (PEG) REPLACEMENT;  Surgeon: Lafayette Dragon, MD;  Location: [facility] END[facility];  Service: Endoscopy;  Laterality: N/A;  button 17fr.2.5cm  . Strabismus surgery Bilateral 1993    "3 on right; 2 on left"  . Back surgery  ~ 2008    Harrington Rods in back needs to be log rolled  . Tendon repair Bilateral 09/05/2012    Procedure: LENGTHENING OF DIGITAL FLEXOR TENDONS BILTERAL HANDS;  Surgeon: Jolyn Nap, MD;  Location: [facility];  Service: Orthopedics;  Laterality: Bilateral;  . Peg placement N/A 09/13/2012  Procedure: PERCUTANEOUS ENDOSCOPIC GASTROSTOMY (PEG) REPLACEMENT;  Surgeon: Lafayette Dragon, MD;  Location: [facility];  Service: Endoscopy;  Laterality: N/A;  . Flexible sigmoidoscopy N/A 10/30/2012    Procedure: FLEXIBLE SIGMOIDOSCOPY;  Surgeon: Cleotis Nipper, MD;  Location: [facility];  Service: Endoscopy;  Laterality: N/A;  . Peg placement N/A 11/15/2012    Procedure: PERCUTANEOUS ENDOSCOPIC GASTROSTOMY (PEG) REPLACEMENT;  Surgeon: Jeryl Columbia, MD;  Location[facility];  Service: Endoscopy;  Laterality: N/A;  . Gastrostomy tube placement  11./02/1998  . Rectal biopsy  10/29/2012    S/P diarrhea from Vancomycin  . Portacath placement Right 11/25/2012     chest  . Eye surgery      Allergies: Vimpat; Adhesive; Depakote; Keppra; Amoxicillin-pot clavulanate; and Neulasta  Medications: Prior to Admission medications   Medication Sig Start Date End Date Taking? Authorizing Provider  Acetaminophen (TYLENOL PO) Place 650 mg into feeding tube every 6 (six) hours as needed (fever).   Yes Historical Provider, MD  albuterol (PROVENTIL HFA;VENTOLIN HFA) 108 (90 BASE) MCG/ACT inhaler Inhale 2 puffs into the lungs every 4 (four) hours as needed for shortness of breath.   Yes Historical Provider, MD  albuterol (PROVENTIL) (2.5 MG/3ML) 0.083% nebulizer solution Take 2.5 mg by nebulization 2 (two) times daily as needed. 1 ampule twice daily and as needed (making 4 doses daily). Take a 8am and 8pm and then prn 6 and 7 are not used at the same time 10/11/12  Yes Elsie Stain, MD  Ascorbic Acid (VITAMIN C) 500 MG/5ML LIQD 300 mg by PEG Tube route every morning.    Yes Historical Provider, MD  baclofen (GABLOFEN) 40000 MCG/20ML SOLN 385.2 mcg by Intrathecal route continuous.  05/04/12  Yes Jodi Geralds, MD  calcium carbonate, dosed in mg elemental calcium, 1250 MG/5ML 1,250 mg of elemental calcium by PEG Tube route 3 (three) times daily. 5 cc via peg tube. Give at 8am , 2pm, and 8pm   Yes Historical Provider, MD  cloBAZam (ONFI) 2.5 MG/ML solution Place 2.5 mL in gastrostomy tube 3 times daily 05/17/14  Yes Jodi Geralds, MD  DIASTAT ACUDIAL 20 MG GEL Place 12.5 mg rectally as needed. For seizure lasting 2 minutes or longer or repetitive seizures.  No more than 1 dose every 12 hours or nasal versed (not both). 04/01/14  Yes Historical Provider, MD  dicyclomine (BENTYL) 10 MG/5ML syrup Place 10 mg into feeding tube every 8 (eight) hours as needed. Not to exceed 5 doses in 1 wk as needed for Abd cramps   Yes Historical Provider, MD  folic acid (FOLVITE) 1 MG tablet 1 mg by Feeding Tube route daily. 8:00am per G tube   Yes Historical Provider, MD  furosemide  (LASIX) 10 MG/ML solution Place 20 mg into feeding tube daily as needed for fluid.    Yes Historical Provider, MD  gabapentin (NEURONTIN) 250 MG/5ML solution Take 18 mL 3 times daily Patient taking differently: Place 900 mg into feeding tube 3 (three) times daily. Give 65mls tid at 8am, 2pm and 8pm 05/01/14  Yes Jodi Geralds, MD  ibuprofen (ADVIL,MOTRIN) 100 MG/5ML suspension Place 400 mg into feeding tube every 6 (six) hours as needed for fever (pain).   Yes Historical Provider, MD  ketoconazole (NIZORAL) 2 % cream Apply 1 application topically 2 (two) times daily as needed for irritation (yeast rash from antibiotics).    Yes Historical Provider, MD  lansoprazole (PREVACID SOLUTAB) 30 MG disintegrating tablet 30 mg by Feeding Tube  route 2 (two) times daily. 7:30am and 7:30pm 09/07/12  Yes Lafayette Dragon, MD  midazolam (VERSED) 5 MG/ML injection Place 2 mLs (10 mg total) into the nose once. Draw up 49ml in 2 syringes. Remove blue vial access device. Attach syringe to nasal atomizer for intranasal administration. Give 53ml in right nostril x 2 for seizures lasting 2 minutes or longer or for repetitive seizures in a short period of time. 11/05/13  Yes Rockwell Germany, NP  Multiple Vitamins-Minerals (MULTIVITAMIN) LIQD 10 mLs by Feeding Tube route daily.    Yes Historical Provider, MD  Multiple Vitamins-Minerals (ZINC PO) 5 mLs by PEG Tube route daily. 244 mg / 5 mL zinc solution   Yes Historical Provider, MD  mupirocin ointment (BACTROBAN) 2 % Apply 1 application topically as needed (to G-T site). 09/07/12  Yes Lafayette Dragon, MD  Nutritional Supplements (PROMOD PO) 30 mLs by Feeding Tube route 2 (two) times daily. Give at 10am and 5pm per  g tube   Yes Historical Provider, MD  Nutritional Supplements (TWOCAL HN) LIQD 237 mLs by Feeding Tube route. T.3 can at 46 cc hour x 12 hours - water 172 cc pre and post each and extra 172 bid.   Yes Historical Provider, MD  OnabotulinumtoxinA (BOTOX IJ) Inject 300 Units  as directed See admin instructions. Every 3 months   Yes Historical Provider, MD  OVER THE COUNTER MEDICATION Apply 1 application topically as needed (after diaper changes). Balmex and Bag Balm paste made and applied after diaper changes   Yes Historical Provider, MD  OXYGEN-HELIUM IN Inhale into the lungs. Oxygen PRN to keep O2 Sat at 90%   Yes Historical Provider, MD  potassium chloride (KLOR-CON) 20 MEQ packet 20 mEq by Feeding Tube route daily.    Yes Historical Provider, MD  Pseudoephedrine HCl (SUDAFED CHILDRENS) 15 MG/5ML LIQD Give 90 mg by tube 3 (three) times daily.    Yes Historical Provider, MD  simethicone (MYLICON) 767 MG chewable tablet 125 mg by Feeding Tube route every 6 (six) hours as needed for flatulence.    Yes Historical Provider, MD  sodium phosphate (FLEET) enema Place 1 enema rectally daily as needed. follow package directions PRN. Help get pass gas   Yes Historical Provider, MD  sucralfate (CARAFATE) 1 GM/10ML suspension Place 1 g into feeding tube 4 (four) times daily. 7am, 130pm, 5pm, 8pm   Alone X 26min   Yes Historical Provider, MD  Water For Irrigation, Sterile (FREE WATER) SOLN Place 172 mLs into feeding tube 4 (four) times daily.   Yes Historical Provider, MD  zinc oxide (BALMEX) 11.3 % CREA cream Apply 1 application topically 5 (five) times daily. With each diaper change.   Yes Historical Provider, MD     Family History  Problem Relation Age of Onset  . Adopted: Yes    History   Social History  . Marital Status: Single    Spouse Name: N/A  . Number of Children: 0  . Years of Education: N/A   Occupational History  . Student    .     Social History Main Topics  . Smoking status: Never Smoker   . Smokeless tobacco: Never Used     Comment: never used tobacco  . Alcohol Use: No  . Drug Use: No  . Sexual Activity: No   Other Topics Concern  . None   Social History Narrative   Pt lives at home with his legal guardians Jenne [facility])  Review of Systems: A 12 point ROS discussed and pertinent positives are indicated in the HPI above.  All other systems are negative.  Review of Systems  Constitutional: Positive for fever and fatigue. Negative for activity change and appetite change.  HENT: Positive for drooling and trouble swallowing.   Respiratory: Positive for cough, shortness of breath and wheezing.   Gastrointestinal: Negative for abdominal pain.  Psychiatric/Behavioral: Positive for agitation.       Pt remains calm and agreeable with parent in room     Vital Signs: BP 151/98 mmHg  Pulse 103  Temp(Src) 100 F (37.8 C) (Rectal)  Resp 23  Wt 56.7 kg (125 lb)  SpO2 100%  Physical Exam  Constitutional: He appears well-nourished.  Cardiovascular: Normal rate and regular rhythm.   No murmur heard. Pulmonary/Chest: Effort normal. He has wheezes.  Abdominal: Soft. Bowel sounds are normal. There is no tenderness.  Musculoskeletal:  Cerebral palsy Deformity of all extremities Quadriplegia   Skin: Skin is warm.  Psychiatric:  Non verbal appaers to respond to moms voice Can focus on me slightly  Legal guardian; foster mom at bedside== signed consent  Nursing note and vitals reviewed.   Mallampati Score:  MD Evaluation Airway: WNL Heart: WNL Abdomen: WNL Chest/ Lungs: WNL ASA  Classification: 3 Mallampati/Airway Score: Two  Imaging: Dg Chest Port 1 View  06/19/2014   CLINICAL DATA:  Fever since 06/15/2014.  EXAM: PORTABLE CHEST - 1 VIEW  COMPARISON:  Single view of the chest 05/11/2014 and 04/12/2014.  FINDINGS: Lung volumes are low as on the comparison examinations. Port-A-Cath is again seen. The lungs appear clear. Heart size is normal. No pneumothorax or pleural effusion. Spinal fusion hardware for scoliosis is again seen.  IMPRESSION: No acute disease.   Electronically Signed   By: Inge Rise M.D.   On: 06/19/2014 18:40   Dg Abd Portable 1v  06/21/2014   CLINICAL DATA:  Patient with  history of small bowel obstruction.  EXAM: PORTABLE ABDOMEN - 1 VIEW  COMPARISON:  Abdominal radiograph 09/15/2012  FINDINGS: Gas is demonstrated within nondilated loops of large and small bowel in a nonobstructed pattern. Supine evaluation limited for the detection of free intraperitoneal air. Persistent elevation of the right hemidiaphragm. Minimal heterogeneous opacities left lung base. Spinal fusion hardware. Right lower quadrant stimulator device. G-tube projects over the left upper quadrant.  IMPRESSION: Nonobstructed bowel gas pattern.  Minimal heterogeneous opacities left lung base may represent atelectasis.   Electronically Signed   By: Lovey Newcomer M.D.   On: 06/21/2014 17:57    Labs:  CBC:  Recent Labs  06/21/14 0520 06/22/14 0526 06/23/14 0545 06/24/14 0600  WBC 1.1* 8.5 3.5* 2.5*  HGB 9.3* 8.9* 8.0* 9.2*  HCT 29.5* 28.0* 25.2* 28.3*  PLT 139* 166 113* 121*    COAGS:  Recent Labs  03/01/14 2247 03/02/14 0400 03/03/14 0430 03/29/14 0900 04/12/14 1011 05/11/14 1425  INR 1.07 1.08 1.08 1.34 1.08 1.04  APTT 34 31 29  --  31  --     BMP:  Recent Labs  06/21/14 0520 06/22/14 0526 06/23/14 0545 06/23/14 1200 06/23/14 1355 06/24/14 0600  NA 145 136 134*  --   --  134*  K 3.6 4.8 2.8* 4.1 3.9 3.0*  CL 114* 103 100*  --   --  99*  CO2 26 26 25   --   --  27  GLUCOSE 121* 159* 121*  --   --  101*  BUN 12 21* 8  --   --  <  5*  CALCIUM 8.4* 8.4* 8.0*  --   --  8.4*  CREATININE 0.58* 1.00 0.45*  --   --  0.44*  GFRNONAA >60 >60 >60  --   --  >60  GFRAA >60 >60 >60  --   --  >60    LIVER FUNCTION TESTS:  Recent Labs  06/21/14 0520 06/22/14 0526 06/23/14 0545 06/24/14 0600  BILITOT 1.9* 0.5 0.8 1.1  AST 18 17 19 27   ALT 33 11* 23 36  ALKPHOS 67 42 72 91  PROT 5.8* 5.4* 4.8* 5.1*  ALBUMIN 2.7* 2.9* 2.1* 2.4*    TUMOR MARKERS: No results for input(s): AFPTM, CEA, CA199, CHROMGRNA in the last 8760 hours.  Assessment and Plan:  Pancytopenia-  worsening Request for Bone marrow bx per [name] Must schedule for Wed 5/25 per [facility] cyto dept schedule/availability Risks and Benefits discussed with the patient's mother including, but not limited to bleeding, infection, damage to adjacent structures or low yield requiring additional tests. All of the patient's questions were answered, patient's mother is agreeable to proceed. Consent signed and in chart.  Pt with severe cerebral palsy; extremities deformed Cannot lay prone---can lay on side MD requests that Mother can be in room for bx---pt becomes agitated without her there. May can do procedure without sedation or very little per parent  Thank you for this interesting consult.  I greatly enjoyed meeting Hercules Hasler and look forward to participating in their care.  Signed: Nevea Spiewak A 06/24/2014, 9:30 AM   I spent a total of 40 Minutes    in face to face in clinical consultation, greater than 50% of which was counseling/coordinating care for bone marrow bx

## 2014-06-24 NOTE — Progress Notes (Signed)
Jeffery [name]  [phone]    HOSPITAL PROGRESS  NOTE   HPI: No other new issues overnight. Cannot provide history, has cerebral palsy. Continues to be febrile, currently at 100 F.   MEDICATIONS: Scheduled Meds: . sodium chloride   Intravenous Once  . sodium chloride   Intravenous Once  . antiseptic oral rinse  7 mL Mouth Rinse q12n4p  . bacitracin   Topical BID  . calcium carbonate (dosed in mg elemental calcium)  1,250 mg of elemental calcium Per Tube TID  . cloBAZam  6.25 mg Per Tube TID  . dicyclomine  10 mg Per Tube TID AC  . folic acid  1 mg Per Tube Daily  . free water  172 mL Per Tube TID AC & HS  . gabapentin  900 mg Per Tube TID  . heparin  5,000 Units Subcutaneous 3 times per day  . levalbuterol  0.63 mg Nebulization Q6H  . lidocaine  15 mL Mouth/Throat Once  . methylPREDNISolone (SOLU-MEDROL) injection  60 mg Intravenous Q24H  . multivitamin  10 mL Per Tube Daily  . pantoprazole sodium  40 mg Per Tube BID  . potassium chloride  20 mEq Per Tube Daily  . potassium chloride  30 mEq Per Tube Daily  . pseudoephedrine  90 mg Per Tube 3 times per day  . simethicone  80 mg Oral TID AC & HS  . sodium chloride  3 mL Intravenous Q12H  . sucralfate  1 g Per Tube TID AC & HS  . vitamin C  250 mg Per Tube Daily  . zinc oxide  1 application Topical 5 X Daily   Continuous Infusions: . baclofen    . dextrose 75 mL/hr at 06/22/14 1900  . dextrose 5 % and 0.9% NaCl 75 mL/hr at 06/22/14 1900   PRN Meds:.acetaminophen (TYLENOL) oral liquid 160 mg/5 mL, diazepam, diphenoxylate-atropine, guaiFENesin-dextromethorphan, ibuprofen, ipratropium-albuterol, ketoconazole, levalbuterol, mupirocin ointment, ondansetron **OR** ondansetron (ZOFRAN) IV, sodium phosphate   ALLERGIES:   Allergies  Allergen Reactions  . Vimpat [Lacosamide] Rash  . Adhesive [Tape]     Rips skin off  . Depakote [Divalproex Sodium]     Causes pancreatitis   . Keppra [Levetiracetam]  Other (See Comments)    Bone marrow suppression  . Amoxicillin-Pot Clavulanate Rash    Give w fluconazole then no rash  . Neulasta [Pegfilgrastim] Palpitations     PHYSICAL EXAMINATION:  Filed Vitals:   06/24/14 0741  BP: 151/98  Pulse: 103  Temp: 100 F (37.8 C)  Resp: 23   Filed Weights   06/19/14 1817  Weight: 125 lb (56.7 kg)   General: eyes open. NAD Lungs: bilateral ronchi, wheezes  CV RRR without r/m/g Abdomen: distended, non tender, decreased bowel sounds Extremities: no edema Neuro: cerebral palsy. Paralysis.Unable to follow commands. [place]. Developmentally delayed  LABORATORY/RADIOLOGY DATA:   Recent Labs Lab 06/19/14 1815  06/20/14 0900 06/21/14 0520 06/22/14 0526 06/23/14 0545 06/24/14 0600  WBC 1.6*  < > 1.2* 1.1* 8.5 3.5* 2.5*  HGB 7.7*  < > 7.1* 9.3* 8.9* 8.0* 9.2*  HCT 25.4*  < > 22.7* 29.5* 28.0* 25.2* 28.3*  PLT 190  < > 126* 139* 166 113* 121*  MCV 106.7*  < > 101.8* 99.7 97.2 98.8 98.3  MCH 32.4  < > 31.8 31.4 30.9 31.4 31.9  MCHC 30.3  < > 31.3 31.5 31.8 31.7 32.5  RDW 20.8*  < > 21.8* 20.9* 14.1 18.1* 17.7*  LYMPHSABS 0.6*  --   --  0.5* 1.5 0.4* 0.5*  MONOABS 0.3  --   --  0.2 1.0 0.4 0.6  EOSABS 0.0  --   --  0.0 0.2 0.2 0.0  BASOSABS 0.0  --   --  0.0 0.0 0.0 0.0  < > = values in this interval not displayed.  CMP    Recent Labs Lab 06/19/14 2305 06/21/14 0520 06/22/14 0526 06/23/14 0545 06/23/14 1200 06/23/14 1355 06/24/14 0600  NA 144 145 136 134*  --   --  134*  K 3.9 3.6 4.8 2.8* 4.1 3.9 3.0*  CL 109 114* 103 100*  --   --  99*  CO2 28 26 26 25   --   --  27  GLUCOSE 99 121* 159* 121*  --   --  101*  BUN 18 12 21* 8  --   --  <5*  CREATININE 0.62 0.58* 1.00 0.45*  --   --  0.44*  CALCIUM 7.6* 8.4* 8.4* 8.0*  --   --  8.4*  MG  --   --   --   --   --  1.8  --   AST 17 18 17 19   --   --  27  ALT 30 33 11* 23  --   --  36  ALKPHOS 66 67 42 72  --   --  91  BILITOT 1.1 1.9* 0.5 0.8  --   --  1.1        Component  Value Date/Time   BILITOT 1.1 06/24/2014 0600   BILITOT 2.10* 05/22/2014 1046    Anemia panel:    Recent Labs  06/22/14 1014 06/23/14 1200  FERRITIN 1054*  --   TIBC 133*  --   IRON 19*  --   RETICCTPCT  --  1.4        Component Value Date/Time   ESRSEDRATE 40* 06/23/2014 1355      Urinalysis    Component Value Date/Time   COLORURINE AMBER* 06/19/2014 1845   APPEARANCEUR CLEAR 06/19/2014 1845   LABSPEC 1.029 06/19/2014 1845   PHURINE 6.0 06/19/2014 1845   GLUCOSEU NEGATIVE 06/19/2014 1845   HGBUR NEGATIVE 06/19/2014 1845   BILIRUBINUR SMALL* 06/19/2014 1845   KETONESUR NEGATIVE 06/19/2014 1845   PROTEINUR >300* 06/19/2014 1845   UROBILINOGEN 1.0 06/19/2014 1845   NITRITE NEGATIVE 06/19/2014 1845   LEUKOCYTESUR NEGATIVE 06/19/2014 1845    Liver Function Tests:  Recent Labs Lab 06/19/14 2305 06/21/14 0520 06/22/14 0526 06/23/14 0545 06/24/14 0600  AST 17 18 17 19 27   ALT 30 33 11* 23 36  ALKPHOS 66 67 42 72 91  BILITOT 1.1 1.9* 0.5 0.8 1.1  PROT 5.1* 5.8* 5.4* 4.8* 5.1*  ALBUMIN 2.7* 2.7* 2.9* 2.1* 2.4*   No results for input(s): LIPASE, AMYLASE in the last 168 hours. No results for input(s): AMMONIA in the last 168 hours.  CBG:  Recent Labs Lab 06/23/14 1607 06/23/14 1922 06/23/14 2246 06/24/14 0406 06/24/14 0737  GLUCAP 115* 120* 135* 113* 117*   Radiology Studies:  Dg Chest Port 1 View  06/19/2014   CLINICAL DATA:  Fever since 06/15/2014.  EXAM: PORTABLE CHEST - 1 VIEW  COMPARISON:  Single view of the chest 05/11/2014 and 04/12/2014.  FINDINGS: Lung volumes are low as on the comparison examinations. Port-A-Cath is again seen. The lungs appear clear. Heart size is normal. No pneumothorax or pleural effusion. Spinal fusion hardware for scoliosis is again seen.  IMPRESSION: No acute disease.   Electronically  Signed   By: Inge Rise M.D.   On: 06/19/2014 18:40   Dg Abd Portable 1v  06/21/2014   CLINICAL DATA:  Patient with history of  small bowel obstruction.  EXAM: PORTABLE ABDOMEN - 1 VIEW  COMPARISON:  Abdominal radiograph 09/15/2012  FINDINGS: Gas is demonstrated within nondilated loops of large and small bowel in a nonobstructed pattern. Supine evaluation limited for the detection of free intraperitoneal air. Persistent elevation of the right hemidiaphragm. Minimal heterogeneous opacities left lung base. Spinal fusion hardware. Right lower quadrant stimulator device. G-tube projects over the left upper quadrant.  IMPRESSION: Nonobstructed bowel gas pattern.  Minimal heterogeneous opacities left lung base may represent atelectasis.   Electronically Signed   By: Lovey Newcomer M.D.   On: 06/21/2014 17:57       ASSESSMENT AND PLAN:    Anemia In the setting of infection, antibiotics, ?bleeding S/P IV Iron and dextran on 5/22 and 1 unit blood on 5/19 Current Hb stable from prior May consider bone marrow biopsy   Thrombocytopenia This is due to dilution, infection,? blood loss, antibiotics Monitor counts closely No transfusion is indicated at this time Transfuse 1 unit of platelets if count is less or equal than 10,000 or 20,000 if the patient is acutely bleeding Hold Heparin if platelets drop to less than 50,000  Leukopenia Due to recent chemotherapy dilution infection antibiotics  Cultures are negative Slowly improving from 1.6 to 2.5 today after 1 dose of Neupogen. He is on Solumedrol IV Will consider Granix if ANC is less than 1.0 Continue to closely monitor  Fever and Sepsis In the setting of infection and neutropenia Continue supportive care and antibiotics Cultures negative to date  Malnutrition Due to dysphagia in the setting of cerebral palsy On PEG tube Appreciate Nutritionist follow up   Full Code   Other medical issues as per admitting team   [facility] E, PA-C 06/24/2014, 8:57 AM  ADDENDUM: I saw and examined Jeffery [name] today. I foresee, I think that we'll going to have to move ahead and think  about getting a bone marrow test on him. His white cell count continues to drop. His hemoglobin is holding pretty steady. He did get iron yesterday. He got iron dextran and did well with this.  I realize that it will be difficult to do a bone marrow test on him because of his body habitus. I think radiology will be able to do this for [place]. He must have his mom there to reassure him because of his underlying mental handicap.  I don't find anything on physical exam it looks suspicious. He is now off antibiotics.  His white cell count is heading downward. I would go ahead and give him a dose of Neupogen today area  His potassium is on the lower side.  I'll not sure if he is getting tube feeds right now.  I would hate to discontinue his antiseizure medication. However, this might be necessary depending on what the bone marrow test shows.  His mom agrees to have this test done now. Again, we have tried to hold off on this as long as possible by think we are at the point now that we have to do this.  I spent about 40 minutes with him today.  [name]

## 2014-06-24 NOTE — Progress Notes (Signed)
[facility]  Patient Status: Active (up until hospitalization)   [name] is providing the following services: SN  If patient discharges after hours, please call [phone].   Jeffery [name] 06/24/2014, 11:09 AM

## 2014-06-24 NOTE — Progress Notes (Signed)
@  2130 Paged [place] on-call for [facility] regarding pt's mother's request that fluids be switched back to D10 from D5NS due to hx of hypoglycemia. Last 2 CBGs 83 and 82. Orders received for  NS and D10 infusions. Mother of pt informed of change and agreeable with current plan. Pt appears in no acute distress. Will continue to monitor and assess.

## 2014-06-25 ENCOUNTER — Telehealth: Payer: Self-pay | Admitting: Family

## 2014-06-25 LAB — COMPREHENSIVE METABOLIC PANEL
ALBUMIN: 2.5 g/dL — AB (ref 3.5–5.0)
ALT: 46 U/L (ref 17–63)
AST: 32 U/L (ref 15–41)
Alkaline Phosphatase: 104 U/L (ref 38–126)
Anion gap: 8 (ref 5–15)
BUN: <5 mg/dL — ABNORMAL LOW (ref 6–20)
CALCIUM: 9.3 mg/dL (ref 8.9–10.3)
CO2: 29 mmol/L (ref 22–32)
CREATININE: 0.47 mg/dL — AB (ref 0.61–1.24)
Chloride: 105 mmol/L (ref 101–111)
GFR calc non Af Amer: >60 mL/min (ref >60)
Glucose, Bld: 91 mg/dL (ref 65–99)
Potassium: 3.9 mmol/L (ref 3.5–5.1)
Sodium: 142 mmol/L (ref 135–145)
TOTAL PROTEIN: 4.8 g/dL — AB (ref 6.5–8.1)
Total Bilirubin: 0.8 mg/dL (ref 0.3–1.2)

## 2014-06-25 LAB — GLUCOSE, CAPILLARY
GLUCOSE-CAPILLARY: 89 mg/dL (ref 65–99)
GLUCOSE-CAPILLARY: 92 mg/dL (ref 65–99)
GLUCOSE-CAPILLARY: 100 mg/dL — AB (ref 65–99)
GLUCOSE-CAPILLARY: 102 mg/dL — AB (ref 65–99)
Glucose-Capillary: 107 mg/dL — ABNORMAL HIGH (ref 65–99)
Glucose-Capillary: 76 mg/dL (ref 65–99)
Glucose-Capillary: 91 mg/dL (ref 65–99)

## 2014-06-25 LAB — CBC WITH DIFFERENTIAL/PLATELET
BASOS PCT: 1 % (ref 0–1)
Basophils Absolute: 0 10*3/uL (ref 0.0–0.1)
EOS ABS: 0.1 10*3/uL (ref 0.0–0.7)
EOS PCT: 4 % (ref 0–5)
HEMATOCRIT: 30 % — AB (ref 39.0–52.0)
Hemoglobin: 9.5 g/dL — ABNORMAL LOW (ref 13.0–17.0)
LYMPHS PCT: 21 % (ref 12–46)
Lymphs Abs: 0.4 10*3/uL — ABNORMAL LOW (ref 0.7–4.0)
MCH: 31.7 pg (ref 26.0–34.0)
MCHC: 31.7 g/dL (ref 30.0–36.0)
MCV: 100 fL (ref 78.0–100.0)
Monocytes Absolute: 0.7 10*3/uL (ref 0.1–1.0)
Monocytes Relative: 33 % — ABNORMAL HIGH (ref 3–12)
NEUTROS ABS: 0.8 10*3/uL — AB (ref 1.7–7.7)
NEUTROS PCT: 41 % — AB (ref 43–77)
PLATELETS: 134 10*3/uL — AB (ref 150–400)
RBC: 3 MIL/uL — AB (ref 4.22–5.81)
RDW: 18.6 % — AB (ref 11.5–15.5)
WBC: 2 10*3/uL — ABNORMAL LOW (ref 4.0–10.5)

## 2014-06-25 LAB — HAPTOGLOBIN: Haptoglobin: 240 mg/dL — ABNORMAL HIGH (ref 34–200)

## 2014-06-25 LAB — MAGNESIUM: Magnesium: 1.7 mg/dL (ref 1.7–2.4)

## 2014-06-25 MED ORDER — POTASSIUM CHLORIDE 10 MEQ/100ML IV SOLN
10.0000 meq | INTRAVENOUS | Status: AC
  Administered 2014-06-25 – 2014-06-26 (×2): 10 meq via INTRAVENOUS
  Filled 2014-06-25 (×2): qty 100

## 2014-06-25 MED ORDER — FUROSEMIDE 10 MG/ML IJ SOLN
40.0000 mg | Freq: Once | INTRAMUSCULAR | Status: AC
  Administered 2014-06-25: 40 mg via INTRAVENOUS
  Filled 2014-06-25: qty 4

## 2014-06-25 NOTE — Progress Notes (Signed)
Pt. Was placed on BIPAP 12/7 with 5L O2 bled in. Pt. Was tolerating BIPAP well after being placed on it. RN states that pt.'s mom took him off about 10 minutes after being placed on. Pt.'s mom is aware to call if she would like for the pt. To go back on BIPAP.

## 2014-06-25 NOTE — Telephone Encounter (Signed)
[name] called to let Dr Gaynell Face know that Jeffery [name] has pump refill appointment for this Thurs May 26th, but that he will still be in the hospital on that day. He is scheduled for bone marrow biopsy tomorrow but Mom doesn't know what time yet. She wonders if Dr Gaynell Face would come to the hospital to do the refill? Mom can be reached at [phone]. TG

## 2014-06-25 NOTE — Progress Notes (Signed)
[place] TEAM 1 - Stepdown/ICU TEAM Progress Note  Jeffery [name] [id number] DOB: 09/30/1990 DOA: 06/19/2014 PCP: Marijean Bravo, MD  Admit HPI / Brief Narrative: Jeffery [name] is a 24 y.o. WM PMHx  cerebral palsy, spastic quadriplegia, wheelchair mobile, seizure disorder, has Port-A-Cath, PEG tube, hx of pancytopenia attributed to Keppra which was discontinued, OSA on BiPAP who presents with fever and cough.  Per patient's foster mother, patient has been having fever in the past 4 days. His temperature was 101.8 at one time point. Patient has mild cough with clear and thick sputum production. No dysuria, diarrhea, abdominal pain per caregiver. He was treated with Tylenol and ibuprofen by foster mother with some help.   In ED, patient was found to have WBC 1.6, negative urinalysis, lactate 1.48, temperature 104.1, tachycardia, electrolytes okay. chest x-ray is negative for acute abnormalities. Patient is admitted to inpatient for further evaluation treatment.  HPI/Subjective: 5/24 alert, eyes open will track you around the room with his eyes , moving his arms, sitting in his customized wheelchair   Assessment/Plan: Fever and sepsis due to unspecified organism/neutropenic fever:  -Source of infection is not clear. Patient rose to 8.5 after first Neupogen administered, now starting to trend down. -Dr. Marin Olp (oncology) and parent trying to decide if bone marrow biopsy worth risk. - Continue D5W-0.9% saline 68m/hr + D10 @ 75 ml/hr -Tmax overnight= 37.8C (afebrile overnight) - Mucinex for cough  - Xopenex Neb QID  -All cultures negative to date see results below -Patient scheduled for bone marrow biopsy in a.m.   Acute on chronic respiratory failure -See sepsis due to unspecified organism -Titrate O2 to maintain SPO2> 93% -Continue BiPAP as needed to maintain SPO2> 93%  Pancytopenia:  -This was noticed in early February. Hematology, Dr. EMarin Olpfollowing and felt that this  was related to [place]. -follow up by CBC, transfuse as needed -Clobazam (Onfi) can cause,    -5/19 Transfuse 2 unit PRBC -Received one dose Neupogen 480 g per oncology with significant increase in WBCs initially now trending down. Awaiting further recommendations from Dr. EMarin Olp(oncology).  Bowel gas (ileus?) -Continue Benytyl + Fleet enema PRN to alleviate bowel gas.   Sizure disorder:  -Patient is allergic to several common seizure medications.  -Currently he is on Clobazam (Onfi) 6.25 mg TID  -PRN Diastat Acudial  OSA:  -Continue nightly BIPAP  Cerebral palsy/congenital spastic quadriplegia/baclofen pump: -continue Baclofen pump to be refilled on 5/26 by Dr. WWyline Copas (neurology) and then patient could be discharged  Dysphagia,s/p PEG tube:  -Nutritionist consulted for management.  Quadriplegia -KinAir bed,    Code Status: FULL Family Communication: Mother present at time of exam Disposition Plan: Resolution sepsis    Consultants: Dr. EMarin Olp(oncology)    Procedure/Significant Events: 5/19 Transfuse 1 unit PRBC 5/20 echocardiogram;- LVEF=55%-60%. -A patent foramen ovale cannot be excluded. - Pericardium, extracardiac: Prominant epicardial fat pad; small pericardial effusion? 5/20 portable abdominal x-ray; Gas is demonstrated within nondilated loops of large and small bowel in a nonobstructed pattern   Culture 5/18 urine negative 5/18 blood left hand 2 NGTD 5/18 MRSA by PCR negative 5/19 legionella urine antigen negative 5/19 strep pneumo urine antigen negative 5/19 blood Porta cath NGTD 5/20 blood left hand NGTD 5/22 C. difficile by PCR negative   Antibiotics: Cefepime 5/19>> stopped 5/22 by oncology Vancomycin 5/18>> stopped 522 by oncology   DVT prophylaxis: Heparin subcutaneous   Devices    LINES / TUBES:  Continuous Infusions: . sodium chloride 25 mL/hr at 06/25/14 1700  . baclofen    .  dextrose 25 mL/hr at 06/25/14 1700    Objective: VITAL SIGNS: Temp: 98.4 F (36.9 C) (05/24 1738) Temp Source: Axillary (05/24 1738) BP: 104/60 mmHg (05/24 1738) Pulse Rate: 110 (05/24 1141) SPO2; FIO2:   Intake/Output Summary (Last 24 hours) at 06/25/14 1847 Last data filed at 06/25/14 1700  Gross per 24 hour  Intake 526.16 ml  Output    925 ml  Net -398.84 ml     Exam: General: alert, eyes open will track you around the room with his eyes, sitting in customized wheelchair comfortably, No acute respiratory distress Eyes: Negative retinal hemorrhage, pupils equal react to light and accommodation ENT: Negative Runny nose   Neck: Negative scars, masses, torticollis, lymphadenopathy, JVD Lungs: clear to auscultation bilateral, negative wheezing/crackles  Cardiovascular: Regular rate and rhythm without murmur gallop or rub normal S1 and S2 Abdomen: Nontender, nondistended, soft to palpation, hypoactive bowel sounds, no rebound, no ascites, no appreciable mass Extremities: No significant cyanosis, clubbing, or edema bilateral lower extremities, 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 Bilateral contracture of hands, moves hands today spontaneously Psychiatric: Negative depression, negative anxiety, negative fatigue, negative mania  Neurologic: Cranial nerves II through XII intact, all extremities muscle strength 0/5,        Data Reviewed: Basic Metabolic Panel:  Recent Labs Lab 06/21/14 0520 06/22/14 0526 06/23/14 0545 06/23/14 1200 06/23/14 1355 06/24/14 0600 06/24/14 1300 06/25/14 0450  NA 145 136 134*  --   --  134*  --  142  K 3.6 4.8 2.8* 4.1 3.9 3.0*  --  3.9  CL 114* 103 100*  --   --  99*  --  105  CO2 26 26 25   --   --  27  --  29  GLUCOSE 121* 159* 121*  --   --  101*  --  91  BUN 12 21* 8  --   --  <5*  --  <5*  CREATININE 0.58* 1.00 0.45*  --   --  0.44*  --  0.47*    CALCIUM 8.4* 8.4* 8.0*  --   --  8.4*  --  9.3  MG  --   --   --   --  1.8  --  1.9 1.7   Liver Function Tests:  Recent Labs Lab 06/21/14 0520 06/22/14 0526 06/23/14 0545 06/24/14 0600 06/25/14 0450  AST 18 17 19 27  32  ALT 33 11* 23 36 46  ALKPHOS 67 42 72 91 104  BILITOT 1.9* 0.5 0.8 1.1 0.8  PROT 5.8* 5.4* 4.8* 5.1* 4.8*  ALBUMIN 2.7* 2.9* 2.1* 2.4* 2.5*   No results for input(s): LIPASE, AMYLASE in the last 168 hours. No results for input(s): AMMONIA in the last 168 hours. CBC:  Recent Labs Lab 06/21/14 0520 06/22/14 0526 06/23/14 0545 06/24/14 0600 06/25/14 0450  WBC 1.1* 8.5 3.5* 2.5* 2.0*  NEUTROABS 0.4* 5.9 2.5 1.4* 0.8*  HGB 9.3* 8.9* 8.0* 9.2* 9.5*  HCT 29.5* 28.0* 25.2* 28.3* 30.0*  MCV 99.7 97.2 98.8 98.3 100.0  PLT 139* 166 113* 121* 134*   Cardiac Enzymes: No results for input(s): CKTOTAL, CKMB, CKMBINDEX, TROPONINI in the last 168 hours. BNP (last 3 results) No results for input(s): BNP in the last 8760 hours.  ProBNP (last 3 results) No results for input(s): PROBNP in the last 8760 hours.  CBG:  Recent Labs Lab 06/25/14 0457 06/25/14 0803 06/25/14 1143 06/25/14 1611 06/25/14 1832  GLUCAP 91 89 107* 92 100*    Recent Results (from the past 240 hour(s))  Urine culture     Status: None   Collection Time: 06/19/14  6:47 PM  Result Value Ref Range Status   Specimen Description URINE, CATHETERIZED  Final   Special Requests NONE  Final   Colony Count NO GROWTH Performed at [facility]   Final   Culture NO GROWTH Performed at [facility]   Final   Report Status 06/20/2014 FINAL  Final  Blood Culture (routine x 2)     Status: None (Preliminary result)   Collection Time: 06/19/14  7:10 PM  Result Value Ref Range Status   Specimen Description BLOOD LEFT HAND  Final   Special Requests BOTTLES DRAWN AEROBIC AND ANAEROBIC 3ML  Final   Culture   Final           BLOOD CULTURE RECEIVED NO GROWTH TO DATE CULTURE WILL BE  HELD FOR 5 DAYS BEFORE ISSUING A FINAL NEGATIVE REPORT Performed at [facility]    Report Status PENDING  Incomplete  Blood Culture (routine x 2)     Status: None (Preliminary result)   Collection Time: 06/19/14  7:20 PM  Result Value Ref Range Status   Specimen Description BLOOD LEFT HAND  Final   Special Requests BOTTLES DRAWN AEROBIC AND ANAEROBIC 5CC  Final   Culture   Final           BLOOD CULTURE RECEIVED NO GROWTH TO DATE CULTURE WILL BE HELD FOR 5 DAYS BEFORE ISSUING A FINAL NEGATIVE REPORT Performed at [facility]    Report Status PENDING  Incomplete  MRSA PCR Screening     Status: None   Collection Time: 06/19/14 11:48 PM  Result Value Ref Range Status   MRSA by PCR NEGATIVE NEGATIVE Final    Comment:        The GeneXpert MRSA Assay (FDA approved for NASAL specimens only), is one component of a comprehensive MRSA colonization surveillance program. It is not intended to diagnose MRSA infection nor to guide or monitor treatment for MRSA infections.   Culture, blood (routine x 2)     Status: None (Preliminary result)   Collection Time: 06/20/14  5:43 PM  Result Value Ref Range Status   Specimen Description BLOOD PORTA CATH  Final   Special Requests BOTTLES DRAWN AEROBIC AND ANAEROBIC 5ML  Final   Culture   Final           BLOOD CULTURE RECEIVED NO GROWTH TO DATE CULTURE WILL BE HELD FOR 5 DAYS BEFORE ISSUING A FINAL NEGATIVE REPORT Performed at [facility]    Report Status PENDING  Incomplete  Culture, blood (single)     Status: None (Preliminary result)   Collection Time: 06/21/14  5:20 AM  Result Value Ref Range Status   Specimen Description BLOOD LEFT HAND  Final   Special Requests BOTTLES DRAWN AEROBIC ONLY 5CC  Final   Culture   Final           BLOOD CULTURE RECEIVED NO GROWTH TO DATE CULTURE WILL BE HELD FOR 5 DAYS BEFORE ISSUING A FINAL NEGATIVE REPORT Performed at [facility]    Report Status PENDING  Incomplete   Clostridium Difficile by PCR     Status: None   Collection Time: 06/23/14 12:01 PM  Result Value Ref Range Status  C difficile by pcr NEGATIVE NEGATIVE Final     Studies:  Recent x-ray studies have been reviewed in detail by the Attending Physician  Scheduled Meds:  Scheduled Meds: . antiseptic oral rinse  7 mL Mouth Rinse q12n4p  . bacitracin   Topical BID  . calcium carbonate (dosed in mg elemental calcium)  1,250 mg of elemental calcium Per Tube TID  . cloBAZam  6.25 mg Per Tube TID  . dicyclomine  10 mg Per Tube TID AC  . folic acid  1 mg Per Tube Daily  . free water  172 mL Per Tube TID AC & HS  . gabapentin  900 mg Per Tube TID  . heparin  5,000 Units Subcutaneous 3 times per day  . levalbuterol  0.63 mg Nebulization Q6H  . lidocaine  15 mL Mouth/Throat Once  . methylPREDNISolone (SOLU-MEDROL) injection  60 mg Intravenous Q24H  . multivitamin  10 mL Per Tube Daily  . pantoprazole sodium  40 mg Per Tube BID  . pseudoephedrine  90 mg Per Tube 3 times per day  . simethicone  80 mg Oral TID AC & HS  . sodium chloride  3 mL Intravenous Q12H  . sucralfate  1 g Per Tube TID AC & HS  . vitamin C  250 mg Per Tube Daily  . zinc oxide  1 application Topical 5 X Daily    Time spent on care of this patient: 40 mins   WOODS, Geraldo Docker , MD  [facility] Office  [phone] Pager [phone]  On-Call/Text Page:      [email or web address]      password [email or web address]  If 7PM-7AM, please contact night-coverage [email or web address] Password [facility] 06/25/2014, 6:47 PM   LOS: 6 days   Care during the described time interval was provided by me .  I have reviewed this patient's available data, including medical history, events of note, physical examination, radiology studies and test results as part of my evaluation  Dia Crawford, MD [phone] Pager

## 2014-06-25 NOTE — Telephone Encounter (Signed)
He was scheduled at 9 AM.  I'm certain that I can do this that morning before coming into the office.  We need to get all the equipment together on Wednesday.

## 2014-06-25 NOTE — Telephone Encounter (Signed)
I called information to Mom. TG

## 2014-06-25 NOTE — Telephone Encounter (Signed)
Mom called back to let Dr Gaynell Face know that the bone marrow biopsy is scheduled for 10:15 AM on Weds 06/26/14. She does not expect him to be discharged after that, and thinks that he will still be in the hospital on Thursday. TG

## 2014-06-25 NOTE — Telephone Encounter (Signed)
If he is discharged on Wednesday I expect her to bring him to the office on Thursday.  If he still him hospital I will come to him.

## 2014-06-26 ENCOUNTER — Telehealth: Payer: Self-pay | Admitting: Family

## 2014-06-26 ENCOUNTER — Inpatient Hospital Stay (HOSPITAL_COMMUNITY): Payer: Medicaid Other

## 2014-06-26 LAB — CULTURE, BLOOD (ROUTINE X 2)
Culture: NO GROWTH
Culture: NO GROWTH

## 2014-06-26 LAB — CBC WITH DIFFERENTIAL/PLATELET
Basophils Absolute: 0 10*3/uL (ref 0.0–0.1)
Basophils Relative: 1 % (ref 0–1)
EOS ABS: 0 10*3/uL (ref 0.0–0.7)
EOS PCT: 3 % (ref 0–5)
HEMATOCRIT: 29.4 % — AB (ref 39.0–52.0)
Hemoglobin: 9.1 g/dL — ABNORMAL LOW (ref 13.0–17.0)
Lymphocytes Relative: 32 % (ref 12–46)
Lymphs Abs: 0.5 10*3/uL — ABNORMAL LOW (ref 0.7–4.0)
MCH: 31.7 pg (ref 26.0–34.0)
MCHC: 31 g/dL (ref 30.0–36.0)
MCV: 102.4 fL — AB (ref 78.0–100.0)
Monocytes Absolute: 0.5 10*3/uL (ref 0.1–1.0)
Monocytes Relative: 31 % — ABNORMAL HIGH (ref 3–12)
NEUTROS ABS: 0.6 10*3/uL — AB (ref 1.7–7.7)
Neutrophils Relative %: 33 % — ABNORMAL LOW (ref 43–77)
Platelets: 148 10*3/uL — ABNORMAL LOW (ref 150–400)
RBC: 2.87 MIL/uL — AB (ref 4.22–5.81)
RDW: 19.3 % — ABNORMAL HIGH (ref 11.5–15.5)
WBC: 1.6 10*3/uL — ABNORMAL LOW (ref 4.0–10.5)

## 2014-06-26 LAB — BONE MARROW EXAM

## 2014-06-26 LAB — BASIC METABOLIC PANEL
Anion gap: 7 (ref 5–15)
BUN: <5 mg/dL — ABNORMAL LOW (ref 6–20)
CO2: 34 mmol/L — ABNORMAL HIGH (ref 22–32)
Calcium: 9.2 mg/dL (ref 8.9–10.3)
Chloride: 99 mmol/L — ABNORMAL LOW (ref 101–111)
Creatinine, Ser: 0.46 mg/dL — ABNORMAL LOW (ref 0.61–1.24)
GFR calc Af Amer: >60 mL/min (ref >60)
GFR calc non Af Amer: >60 mL/min (ref >60)
Glucose, Bld: 94 mg/dL (ref 65–99)
Potassium: 3.5 mmol/L (ref 3.5–5.1)
Sodium: 140 mmol/L (ref 135–145)

## 2014-06-26 LAB — GLUCOSE, CAPILLARY
GLUCOSE-CAPILLARY: 110 mg/dL — AB (ref 65–99)
Glucose-Capillary: 87 mg/dL (ref 65–99)
Glucose-Capillary: 104 mg/dL — ABNORMAL HIGH (ref 65–99)
Glucose-Capillary: 77 mg/dL (ref 65–99)

## 2014-06-26 LAB — MAGNESIUM: Magnesium: 1.8 mg/dL (ref 1.7–2.4)

## 2014-06-26 MED ORDER — FUROSEMIDE 10 MG/ML IJ SOLN
20.0000 mg | Freq: Two times a day (BID) | INTRAMUSCULAR | Status: DC
  Administered 2014-06-26 – 2014-06-27 (×3): 20 mg via INTRAVENOUS
  Filled 2014-06-26 (×4): qty 2

## 2014-06-26 MED ORDER — ACETYLCYSTEINE 20 % IN SOLN
3.0000 mL | Freq: Three times a day (TID) | RESPIRATORY_TRACT | Status: DC
  Filled 2014-06-26: qty 4

## 2014-06-26 MED ORDER — TWOCAL HN PO LIQD
552.0000 mL | ORAL | Status: AC
Start: 2014-06-26 — End: 2014-06-27
  Filled 2014-06-26: qty 711

## 2014-06-26 MED ORDER — POTASSIUM CHLORIDE 20 MEQ/15ML (10%) PO SOLN
20.0000 meq | Freq: Two times a day (BID) | ORAL | Status: DC
  Administered 2014-06-26 – 2014-06-27 (×3): 20 meq
  Filled 2014-06-26 (×4): qty 15

## 2014-06-26 MED ORDER — MIDAZOLAM HCL 2 MG/2ML IJ SOLN
INTRAMUSCULAR | Status: AC
  Filled 2014-06-26: qty 2

## 2014-06-26 MED ORDER — CALCIUM CARBONATE 1250 MG/5ML PO SUSP
500.0000 mg | Freq: Three times a day (TID) | ORAL | Status: DC
Start: 2014-06-26 — End: 2014-06-27
  Administered 2014-06-26 – 2014-06-27 (×3): 500 mg
  Filled 2014-06-26 (×7): qty 5

## 2014-06-26 MED ORDER — LIDOCAINE HCL 1 % IJ SOLN
INTRAMUSCULAR | Status: AC
  Filled 2014-06-26: qty 20

## 2014-06-26 MED ORDER — FILGRASTIM 480 MCG/1.6ML IJ SOLN
480.0000 ug | Freq: Every day | INTRAMUSCULAR | Status: DC
  Administered 2014-06-26 – 2014-06-27 (×2): 480 ug via SUBCUTANEOUS
  Filled 2014-06-26 (×2): qty 1.6

## 2014-06-26 MED ORDER — FENTANYL CITRATE (PF) 100 MCG/2ML IJ SOLN
INTRAMUSCULAR | Status: AC
  Filled 2014-06-26: qty 2

## 2014-06-26 NOTE — Progress Notes (Signed)
Jeffery [name] is scheduled for bone marrow biopsy today. His white cell count is down to 1.8. I have been trying to hold off on the Neupogen until after the bone marrow biopsy is done. I do not want the Neupogen to interfere with the results.  His hemoglobin is 9.1.  He apparently had a little bit of a difficulty last night. It sound like he was volume overloaded. He got some Lasix.  He's had no fever. He is still off antibiotics.  His tube feeds are being given but at a lower rate right now.  His platelet count is still doing good at 148.  I still think that he needs to have his stools checked for bleeding.  He certainly is not hemolyzing. His would take count 3 days ago was 1.4%. His haptoglobin was 240.  Once he has his bone marrow test done, then I'll give him Neupogen.  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 I'm not sure of this is from medications or if this is part of the overall disease process that he has.  His mom definitely understands the problem that we have. She and I will certainly talk about what we need to do for him depending on what we find with the bone marrow. Hopefully there'll be some results coming back on Friday.  His physical exam is pretty much unchanged. His vital signs look okay. Heart rate is 108. Temperature 97.3. Blood pressure 122/61. His lungs show some scattered rhonchi. He has decent breath sounds bilaterally. Cardiac exam is tachycardic but regular. Abdomen is soft. There may be some slight distention. I really cannot palpate his liver or spleen. Extremities show some 1+ pitting edema.  Again, we will try to hold off on a bone marrow as long as possible. I just don't think we have any other option now. It will be very interesting to see what this result is.  I very much appreciate radiology's outstanding efforts in trying to get this done and allowing his mom to be with him to comfort him and to call him down.  This is a very  difficult situation. I think the bone marrow will "shed light" onto what is going on.  [name]   

## 2014-06-26 NOTE — Sedation Documentation (Signed)
No sedation provided. Pt tolerated procedure well. Mom remained in CT room for the entire procedure she was given instructions on where to stand when scanning was taken place. She was also gowned with a lead apron and thyroid collar.

## 2014-06-26 NOTE — Progress Notes (Signed)
NURSING PROGRESS NOTE  Jeffery [name] [id number] Transfer Data: 06/26/2014 6:35 PM Attending Provider: Cherene Altes, MD IFO:[email or web address], DAVID C, MD Code Status: FULL.  Text paged Dr. Thereasa Solo to clarify code status r/t IR procedure. Awaiting orders.   Jeffery [name] is a 24 y.o. male patient transferred from 3S -No acute distress noted.  -No complaints of shortness of breath.  -No complaints of chest pain.   Cardiac Monitoring: Box # 04 in place.   Blood pressure 108/86, pulse 93, temperature 98.1 F (36.7 C), temperature source Axillary, resp. rate 16, height 4\' 2"  (1.27 m), weight 63.957 kg (141 lb), SpO2 100 %.   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  Past Surgical History:   has past surgical history that includes Mole removal ("several B/T 2008-2010"); Jejunostomy feeding tube (03/08/2013; ~ 09/2013; ~ 01/2014); Gastrostomy tube placement (11./02/1998); Button change (12/15/2010); PEG placement (10/07/2011); Inguinal hernia repair (Bilateral, 1992); Retinopathy of prematurity surgery (1992); Achilles tendon lengthening (Bilateral, 12/1998); Tendon release (12/1998); Infusion pump implantation (07/25/2000; 2013); PEG placement (N/A, 06/05/2012); Strabismus surgery  (Bilateral, 1993); Back surgery (~ 2008); Tendon repair (Bilateral, 09/05/2012); PEG placement (N/A, 09/13/2012); Flexible sigmoidoscopy (N/A, 10/30/2012); PEG placement (N/A, 11/15/2012); Gastrostomy tube placement (11./02/1998); Rectal biopsy (10/29/2012); Portacath placement (Right, 11/25/2012); and Eye surgery.  Social History:   reports that he has never smoked. He has never used smokeless tobacco. He reports that he does not drink alcohol or use illicit drugs.  Skin: buttock red, blanchable. Some moisture associated breakdown to groin. Peg tub to abdomen site clean dry and intact.   Patient/Family orientated to room. Information packet given to patient/family. Call light within reach. Patient/family able to voice and demonstrate understanding of unit orientation instructions.    Will continue to evaluate and treat per MD orders.

## 2014-06-26 NOTE — Telephone Encounter (Signed)
I have reviewed the chart and agreed with the plans of Dr. Thereasa Solo.  I left a message with mother telling her that I supported his decision and that I was not going to tell him that Jeffery [name] had to stay in stepdown.  I plan to see him in the morning to attempt to empty refill and reprogram his baclofen pump.  He has gained a lot of fluid weight and this could be very difficult.

## 2014-06-26 NOTE — Progress Notes (Signed)
  [place] TEAM 1 - Stepdown/ICU TEAM  Despite my open conversation with the patient's mother this morning, in which I explained the reasons the patient should be and is stable for transfer to a medical unit, she has made multiple complaints regarding this planned transfer since I left the unit.  These complaints were not voiced directly to me, and as a matter fact when I discussed the patient's transfer with the mother this morning her only request was that he be transferred to [address] as she reported he has had a very good experience there in the past.  My decision to transfer the patient from stepdown to a medical bed is not based on "him not qualifying for that bed" but rather in the interest of appropriate transitioning of an improving patient.  At the same time, while he is improving, I do not feel he is stable for discharge home due to multiple active medical issues to include significant volume overload requiring closely monitored diuretic therapy, electrolyte monitoring, and resumption of tube feeding with the need to observe/confirm that he is tolerating this resumption.  Furthermore his blood counts have been trending downward and I do not feel it is appropriate to send him home until these counts have at least stabilized.  I spent an extended period of time in the room discussing my plan of care with the patient's mother this morning.  Due to a busy census and a critically ill/coding patient I simply have not been able to go back to the room to spend more time re-explaining my plan.  Nonetheless I have received multiple pages from the unit staff regarding her unhappiness, the last of which resulted in me speaking with the patient's outpatient social worker and again explaining the reasoning for what I firmly believe is the appropriate medical care we are attempting to provide for this patient.  The nurse has informed me that the patient's mother is considering leaving AMA rather than being  transferred.  I have asked the nurse to explain to the mother that this would be an unwise decision in my opinion.  Cherene Altes, MD [facility] For Consults/Admissions - Flow Manager [phone] Office  [phone]  Contact MD directly via text page:      [email or web address]      password [facility]

## 2014-06-26 NOTE — Procedures (Signed)
Interventional Radiology Procedure Note  Procedure: CT guided aspirate and core biopsy of right iliac bone Complications: None   Greene Diodato T. Kathlene Cote, M.D Pager:  [phone]

## 2014-06-26 NOTE — Progress Notes (Signed)
Pt. Mother assists pt. With placing on cpap at [facility]. RT informed pt. Family to notify if they needed anything.

## 2014-06-26 NOTE — Progress Notes (Signed)
Paged Dr. Thereasa Solo. Pts mom not wanting transfer to floor. Dr. Thereasa Solo spoke with pts home social worker. Pts mom agreed to transfer to floor.

## 2014-06-26 NOTE — Progress Notes (Signed)
Report called to [facility] on 5W.

## 2014-06-26 NOTE — Progress Notes (Signed)
RT arrived to place patient on BIPAP [facility]. Mom refused to have patient put on. She stated that she didn't think he would tolerate therapy at this time. RN called at 0230 and stated the patients mom placed him on BIPAP.

## 2014-06-26 NOTE — Progress Notes (Signed)
Attempted to call report

## 2014-06-26 NOTE — Telephone Encounter (Signed)
Jeffery [name] left message saying that she needed Dr Gaynell Face to call Dr Marian Sorrow (hospitalist) about Jeffery [name]. I called her back and she was very upset and crying, saying that Dr Marian Sorrow had ordered for [facility] to be moved from the room he is in to the floor. She said that she was told that he didn't qualify for that room any more and had to go to the floor. Mom said that Dr Marian Sorrow doesn't know Jeffery [name] and doesn't know that he is never transferred to the floor because of the risk of secondary infection. Mom wants to take him home instead and Dr Marian Sorrow refused. She said that [place] had a near 20 lb fluid weight gain in the past week that was being dealt with and she understood monitoring him but that she and the home health nurse could do it at home. Mom feels that he is being moved from the unit because he is a no-code status and that if that wasn't there that they would keep him. She said that from her perspective that they are not full and should not "push him out" to the floor where he can get sick. Mom wants Dr Gaynell Face to call Dr Marian Sorrow and tell him that Jeffery [name] should stay where he is or should go home, but not to the floor. I told Mom that while I would relay the message, I am not confident that Dr Gaynell Face will be able to make that call. I attempted to explain that Jeffery [name] is under Dr McClure's care etc but she would not discuss that. Mom continued to cry and asked for Dr Gaynell Face to call her and Dr Marian Sorrow. She can be reached at [phone]. TG

## 2014-06-26 NOTE — Progress Notes (Signed)
Transferred pt to 5W03. Mom at bedside and aware of pt transfer.

## 2014-06-26 NOTE — Progress Notes (Signed)
[place] TEAM 1 - Stepdown/ICU TEAM Progress Note  Lennix Kneisel [id number]:[id number] DOB: 1990/08/18 DOA: 06/19/2014 PCP: Marijean Bravo, MD  Admit HPI / Brief Narrative: 24 yo M Hx cerebral palsy, spastic quadriplegia, wheelchair mobile, seizure disorder, has Port-A-Cath, PEG tube, hx of pancytopenia attributed to Keppra which was discontinued, OSA on BiPAP who presented with fever and cough.  Per patient's foster mother patient had been having fever for 4 days. His temperature was 101.8 at one point. Patient had mild cough with clear and thick sputum production. No dysuria, diarrhea, abdominal pain per caregiver. He was treated with Tylenol and ibuprofen by foster mother with some improvement.   In ED, patient was found to have WBC 1.6, negative urinalysis, lactate 1.48, temperature 104.1, tachycardia. Chest x-ray was negative for acute abnormalities.   HPI/Subjective: The patient is noncommunicative but does not appear to be in respiratory distress or pain.  The patient's mother, his primary caregiver, is at the bedside and we have had another lengthy conversation today concerning his care.  Despite physician orders she has frequently refused medications or interventions, making the appropriate care of this patient challenging.  I have spoken to her at length today about the interventions and medical adjustments I have planned and have explained that it is very important that we follow through with this treatment plan.  Assessment/Plan:  Fever and sepsis due to unspecified organism/neutropenic fever -Source of fever not clear - WBC rose to 8.5 after first Neupogen administered, now continues to trend down - patient to be dosed with Neupogen again today after his bone marrow biopsy -Dr. Marin Olp following  -All cultures negative to date - empiric antibiotics discontinued - continue to follow - no true fever since 5/20  Acute on chronic respiratory failure -Resolved - wean oxygen as  able  Anasarca -TTE noted normal ejection fraction - edema appears to be primarily related to low oncotic state (albumen 2.5) as well as immobile status - baseline weight appears to be approximately 56 kg - patient currently well above this at 64 kg with net +10 L since admission - continue diarrhetic, follow daily weights, follow strict I's and O's  Pancytopenia  -initially appreciated in early February - Dr. Marin Olp following and felt at that time this was related to [place] which was discontinued   -5/19 Transfused 2 units PRBC -Received one dose Neupogen 480 g per Oncology with significant increase in WBCs initially  -Etiology remains unclear - oncology has ordered a bone marrow biopsy for today - Neupogen to be dosed again today   Hypokalemia -Supplement on schedule w/ scheduled lasix tx - magnesium stable   Ileus? -No clinical evidence of this presently - follow with resumption of tube feeds post bone marrow biopsy   Sizure disorder -allergic to several common seizure medications -currently on Clobazam (Onfi) 6.25 mg TID -No evidence of active seizures at this time   OSA -Continue nightly BIPAP per home regimen   Cerebral palsy/congenital spastic quadriplegia/baclofen pump -continue Baclofen pump - appears stable presently - Dr. Gaynell Face has agreed to refill the patient's pump on 5/26 in the hospital   Dysphagia s/p PEG tube  -Nutritionist consulted for management - no complications presently - resume tube feeds post bone marrow biopsy today - importance of balanced consistent nutrition stressed to mother at bedside   Quadriplegia -[facility] bed    Code Status: FULL Family Communication: Spoke with mother at bedside at length Disposition Plan: pt is stable for transfer to a tele bed -  Oncology to f/u on bone marrow bx results - Child Neuro to come and refill his baclofen pump on 5/26 - follow cell counts - follow fever curve - follow CBGs - follow Is/Os and weight w/ attempts  to diurese   Consultants: Dr. Marin Olp (Oncology)  Procedure/Significant Events: 5/19 Transfuse 2 units PRBC 5/20 TTE EF 55%-60% - patent foramen ovale cannot be excluded. - Pericardium, extracardiac: Prominant epicardial fat pad; small pericardial effusion?  Antibiotics: Cefepime 5/19 > 5/22  Vancomycin 5/18 > 5/21   DVT prophylaxis: Heparin subcutaneous  Objective: Blood pressure 109/67, pulse 100, temperature 96.6 F (35.9 C), temperature source Axillary, resp. rate 25, height 4' 2"  (1.27 m), weight 63.957 kg (141 lb), SpO2 100 %.  Intake/Output Summary (Last 24 hours) at 06/26/14 4193 Last data filed at 06/26/14 0900  Gross per 24 hour  Intake 1344.5 ml  Output    975 ml  Net  369.5 ml   Exam: General: Awake, tracks the examiner with eyes, noncommunicative, no acute respiratory distress Lungs: Good air movement throughout without focal crackles or wheezing Cardiovascular: Regular rate and rhythm without murmur gallop or rub  Abdomen: Nontender, distended, bowel sounds present, no rebound, no ascites, no appreciable mass - PEG insertion site clean and dry Extremities: No significant cyanosis, or clubbing, 2+ diffuse edema bilateral lower extremities - chronic bilateral contractures of bilateral upper and lower extremities  Data Reviewed: Basic Metabolic Panel:  Recent Labs Lab 06/22/14 0526 06/23/14 0545 06/23/14 1200 06/23/14 1355 06/24/14 0600 06/24/14 1300 06/25/14 0450 06/26/14 0545  NA 136 134*  --   --  134*  --  142 140  K 4.8 2.8* 4.1 3.9 3.0*  --  3.9 3.5  CL 103 100*  --   --  99*  --  105 99*  CO2 26 25  --   --  27  --  29 34*  GLUCOSE 159* 121*  --   --  101*  --  91 94  BUN 21* 8  --   --  <5*  --  <5* <5*  CREATININE 1.00 0.45*  --   --  0.44*  --  0.47* 0.46*  CALCIUM 8.4* 8.0*  --   --  8.4*  --  9.3 9.2  MG  --   --   --  1.8  --  1.9 1.7 1.8   Liver Function Tests:  Recent Labs Lab 06/21/14 0520 06/22/14 0526 06/23/14 0545  06/24/14 0600 06/25/14 0450  AST 18 17 19 27  32  ALT 33 11* 23 36 46  ALKPHOS 67 42 72 91 104  BILITOT 1.9* 0.5 0.8 1.1 0.8  PROT 5.8* 5.4* 4.8* 5.1* 4.8*  ALBUMIN 2.7* 2.9* 2.1* 2.4* 2.5*   CBC:  Recent Labs Lab 06/22/14 0526 06/23/14 0545 06/24/14 0600 06/25/14 0450 06/26/14 0545  WBC 8.5 3.5* 2.5* 2.0* 1.6*  NEUTROABS 5.9 2.5 1.4* 0.8* 0.6*  HGB 8.9* 8.0* 9.2* 9.5* 9.1*  HCT 28.0* 25.2* 28.3* 30.0* 29.4*  MCV 97.2 98.8 98.3 100.0 102.4*  PLT 166 113* 121* 134* 148*    CBG:  Recent Labs Lab 06/25/14 1832 06/25/14 2006 06/25/14 2347 06/26/14 0426 06/26/14 0902  GLUCAP 100* 102* 76 87 104*    Recent Results (from the past 240 hour(s))  Urine culture     Status: None   Collection Time: 06/19/14  6:47 PM  Result Value Ref Range Status   Specimen Description URINE, CATHETERIZED  Final   Special Requests NONE  Final  Colony Count NO GROWTH Performed at [facility]   Final   Culture NO GROWTH Performed at [facility]   Final   Report Status 06/20/2014 FINAL  Final  Blood Culture (routine x 2)     Status: None   Collection Time: 06/19/14  7:10 PM  Result Value Ref Range Status   Specimen Description BLOOD LEFT HAND  Final   Special Requests BOTTLES DRAWN AEROBIC AND ANAEROBIC 3ML  Final   Culture   Final    NO GROWTH 5 DAYS Performed at [facility]    Report Status 06/26/2014 FINAL  Final  Blood Culture (routine x 2)     Status: None   Collection Time: 06/19/14  7:20 PM  Result Value Ref Range Status   Specimen Description BLOOD LEFT HAND  Final   Special Requests BOTTLES DRAWN AEROBIC AND ANAEROBIC 5CC  Final   Culture   Final    NO GROWTH 5 DAYS Performed at [facility]    Report Status 06/26/2014 FINAL  Final  MRSA PCR Screening     Status: None   Collection Time: 06/19/14 11:48 PM  Result Value Ref Range Status   MRSA by PCR NEGATIVE NEGATIVE Final    Comment:        The GeneXpert MRSA Assay (FDA approved  for NASAL specimens only), is one component of a comprehensive MRSA colonization surveillance program. It is not intended to diagnose MRSA infection nor to guide or monitor treatment for MRSA infections.   Culture, blood (routine x 2)     Status: None (Preliminary result)   Collection Time: 06/20/14  5:43 PM  Result Value Ref Range Status   Specimen Description BLOOD PORTA CATH  Final   Special Requests BOTTLES DRAWN AEROBIC AND ANAEROBIC 5ML  Final   Culture   Final           BLOOD CULTURE RECEIVED NO GROWTH TO DATE CULTURE WILL BE HELD FOR 5 DAYS BEFORE ISSUING A FINAL NEGATIVE REPORT Performed at [facility]    Report Status PENDING  Incomplete  Culture, blood (single)     Status: None (Preliminary result)   Collection Time: 06/21/14  5:20 AM  Result Value Ref Range Status   Specimen Description BLOOD LEFT HAND  Final   Special Requests BOTTLES DRAWN AEROBIC ONLY 5CC  Final   Culture   Final           BLOOD CULTURE RECEIVED NO GROWTH TO DATE CULTURE WILL BE HELD FOR 5 DAYS BEFORE ISSUING A FINAL NEGATIVE REPORT Performed at [facility]    Report Status PENDING  Incomplete  Clostridium Difficile by PCR     Status: None   Collection Time: 06/23/14 12:01 PM  Result Value Ref Range Status   C difficile by pcr NEGATIVE NEGATIVE Final     Studies:  Recent x-ray studies have been reviewed in detail by the Attending Physician  Scheduled Meds:  Scheduled Meds: . antiseptic oral rinse  7 mL Mouth Rinse q12n4p  . bacitracin   Topical BID  . calcium carbonate (dosed in mg elemental calcium)  500 mg of elemental calcium Per Tube TID  . cloBAZam  6.25 mg Per Tube TID  . dicyclomine  10 mg Per Tube TID AC  . folic acid  1 mg Per Tube Daily  . free water  172 mL Per Tube TID AC & HS  . gabapentin  900 mg Per Tube TID  .  heparin  5,000 Units Subcutaneous 3 times per day  . levalbuterol  0.63 mg Nebulization Q6H  . lidocaine  15 mL Mouth/Throat Once  .  methylPREDNISolone (SOLU-MEDROL) injection  60 mg Intravenous Q24H  . multivitamin  10 mL Per Tube Daily  . pantoprazole sodium  40 mg Per Tube BID  . pseudoephedrine  90 mg Per Tube 3 times per day  . simethicone  80 mg Oral TID AC & HS  . sodium chloride  3 mL Intravenous Q12H  . sucralfate  1 g Per Tube TID AC & HS  . vitamin C  250 mg Per Tube Daily  . zinc oxide  1 application Topical 5 X Daily    Time spent on care of this patient: 35 mins  Cherene Altes, MD [facility] For Consults/Admissions - Flow Manager - [phone] Office  [phone]  Contact MD directly via text page:      [email or web address]      password [facility]  06/26/2014, 9:52 AM   LOS: 7 days

## 2014-06-27 ENCOUNTER — Encounter (HOSPITAL_COMMUNITY): Payer: Self-pay | Admitting: Pediatrics

## 2014-06-27 ENCOUNTER — Telehealth: Payer: Self-pay | Admitting: Family

## 2014-06-27 ENCOUNTER — Encounter: Payer: Medicaid Other | Admitting: Pediatrics

## 2014-06-27 DIAGNOSIS — D619 Aplastic anemia, unspecified: Secondary | ICD-10-CM

## 2014-06-27 LAB — CBC WITH DIFFERENTIAL/PLATELET
Basophils Absolute: 0 10*3/uL (ref 0.0–0.1)
Basophils Relative: 0 % (ref 0–1)
EOS ABS: 0.1 10*3/uL (ref 0.0–0.7)
Eosinophils Relative: 2 % (ref 0–5)
HCT: 31.1 % — ABNORMAL LOW (ref 39.0–52.0)
HEMOGLOBIN: 9.3 g/dL — AB (ref 13.0–17.0)
Lymphocytes Relative: 18 % (ref 12–46)
Lymphs Abs: 0.8 10*3/uL (ref 0.7–4.0)
MCH: 31 pg (ref 26.0–34.0)
MCHC: 29.9 g/dL — ABNORMAL LOW (ref 30.0–36.0)
MCV: 103.7 fL — ABNORMAL HIGH (ref 78.0–100.0)
MONO ABS: 1 10*3/uL (ref 0.1–1.0)
Monocytes Relative: 24 % — ABNORMAL HIGH (ref 3–12)
NEUTROS PCT: 56 % (ref 43–77)
Neutro Abs: 2.4 10*3/uL (ref 1.7–7.7)
PLATELETS: 151 10*3/uL (ref 150–400)
RBC: 3 MIL/uL — ABNORMAL LOW (ref 4.22–5.81)
RDW: 20 % — ABNORMAL HIGH (ref 11.5–15.5)
WBC: 4.3 10*3/uL (ref 4.0–10.5)

## 2014-06-27 LAB — CULTURE, BLOOD (ROUTINE X 2): Culture: NO GROWTH

## 2014-06-27 LAB — COMPREHENSIVE METABOLIC PANEL
ALT: 51 U/L (ref 17–63)
AST: 37 U/L (ref 15–41)
Albumin: 2.5 g/dL — ABNORMAL LOW (ref 3.5–5.0)
Alkaline Phosphatase: 117 U/L (ref 38–126)
Anion gap: 12 (ref 5–15)
BUN: <5 mg/dL — ABNORMAL LOW (ref 6–20)
CO2: 35 mmol/L — AB (ref 22–32)
Calcium: 9.4 mg/dL (ref 8.9–10.3)
Chloride: 92 mmol/L — ABNORMAL LOW (ref 101–111)
Creatinine, Ser: 0.57 mg/dL — ABNORMAL LOW (ref 0.61–1.24)
GFR calc non Af Amer: >60 mL/min (ref >60)
Glucose, Bld: 79 mg/dL (ref 65–99)
Potassium: 2.8 mmol/L — ABNORMAL LOW (ref 3.5–5.1)
SODIUM: 139 mmol/L (ref 135–145)
TOTAL PROTEIN: 4.9 g/dL — AB (ref 6.5–8.1)
Total Bilirubin: 0.6 mg/dL (ref 0.3–1.2)

## 2014-06-27 LAB — CULTURE, BLOOD (SINGLE): CULTURE: NO GROWTH

## 2014-06-27 LAB — GLUCOSE, CAPILLARY
GLUCOSE-CAPILLARY: 83 mg/dL (ref 65–99)
GLUCOSE-CAPILLARY: 108 mg/dL — AB (ref 65–99)
Glucose-Capillary: 15 mg/dL — CL (ref 65–99)
Glucose-Capillary: 77 mg/dL (ref 65–99)
Glucose-Capillary: 84 mg/dL (ref 65–99)
Glucose-Capillary: 93 mg/dL (ref 65–99)
Glucose-Capillary: 95 mg/dL (ref 65–99)

## 2014-06-27 LAB — MAGNESIUM: Magnesium: 1.6 mg/dL — ABNORMAL LOW (ref 1.7–2.4)

## 2014-06-27 LAB — PHOSPHORUS: Phosphorus: 3.7 mg/dL (ref 2.5–4.6)

## 2014-06-27 MED ORDER — PRO-STAT SUGAR FREE PO LIQD: 30.0000 mL | Freq: Two times a day (BID) | ORAL | Status: AC

## 2014-06-27 MED ORDER — NONFORMULARY OR COMPOUNDED ITEM: 768.0000 mL | Status: DC

## 2014-06-27 MED ORDER — HEPARIN SOD (PORK) LOCK FLUSH 100 UNIT/ML IV SOLN
500.0000 [IU] | INTRAVENOUS | Status: AC | PRN
  Administered 2014-06-27: 500 [IU]

## 2014-06-27 MED ORDER — JEVITY 1.2 CAL PO LIQD: 1000.0000 mL | ORAL | Status: DC

## 2014-06-27 MED ORDER — PRO-STAT SUGAR FREE PO LIQD: 30.0000 mL | Freq: Two times a day (BID) | ORAL | Status: DC

## 2014-06-27 MED ORDER — POTASSIUM CHLORIDE 20 MEQ/15ML (10%) PO SOLN
40.0000 meq | Freq: Once | ORAL | Status: AC
  Administered 2014-06-27: 40 meq
  Filled 2014-06-27: qty 30

## 2014-06-27 MED ORDER — POTASSIUM CHLORIDE 20 MEQ/15ML (10%) PO SOLN: 40.0000 meq | Freq: Every day | ORAL | Status: DC

## 2014-06-27 MED ORDER — NON FORMULARY: 237.0000 mL | Status: DC

## 2014-06-27 MED ORDER — HEPARIN SOD (PORK) LOCK FLUSH 100 UNIT/ML IV SOLN: 500.0000 [IU] | INTRAVENOUS | Status: DC | PRN

## 2014-06-27 NOTE — Progress Notes (Signed)
Jeffery [name] did very well yesterday with the bone marrow test. His mom was very impressed with the compassion that were shown to him down in the radiology suite. She is incredibly thankful for all that they did for him yesterday and getting the test done with minimal aggravation and pain. Hopefully, we will have some preliminary results out today.  His white cell count has been coming down. I did go ahead and give me dose of Neupogen yesterday. This was after the bone marrow test was done area and  He slept well last night. He's had no issues with fever. He is wearing the CPAP. His tube feedings are going okay.  She really wants to take him home. They have an incredibly thorough set up at home for his care. They have monitors. They really are well-equipped to take care of him at home. I do not see any problems with him being able to go home today. This would be easiest for he and his family. If I would be safer for him since he would be out of the hospital and hopefully away from any iatrogenic infection exposure.  His vital signs are all stable. Temperature 97.6. Blood pressure 97/47. Pulse is 73. His lungs are clear laterally. I do not hear any rales or wheezes. Cardiac exam regular rate and rhythm with no murmurs, rubs or bruits. Abdomen is soft. He has decent bowel sounds. There is no fluid wave. There is no palpable liver or spleen tip. Extremities shows the deformity secondary to the cerebral palsy. He does not have as much edema in his extremities. Skin exam is without rashes, ecchymoses or petechia.  Again, we will have to await the bone morrow result. Hopefully, there'll be a preliminary result out today.  We will see what his hemoglobin is and white cell count is.  [name]  Rodman Key 7:7-8

## 2014-06-27 NOTE — Discharge Summary (Addendum)
Physician Discharge Summary  Jeffery [name] HTD:[id number] DOB: 1990/11/08 DOA: 06/19/2014  PCP: Marijean Bravo, MD  Admit date: 06/19/2014 Discharge date: 06/27/2014  Time spent: > 35 minutes  Recommendations for Outpatient Follow-up:  1. Pt to f/u with oncology 2. Also continue home health services on discharge 3. Reassess potassium levels, will provide extra 40 meq dose prior to discharge.  Discharge Diagnoses:  Principal Problem:   Sepsis Active Problems:   Congenital spastic quadriplegia   Dysphagia   Generalized convulsive epilepsy   Chronic respiratory failure   Pancytopenia   Neutropenic fever   Fever   OSA treated with BiPAP   Blood poisoning   Acute on chronic respiratory failure with hypoxia   Cerebral palsy   Ileus   Seizure disorder   OSA (obstructive sleep apnea)   Quadriplegia   Obstructive sleep apnea   Spastic quadriplegia   Cerebral palsy, quadriplegic   Acquired pancytopenia   Discharge Condition: Partial  Diet recommendation: Pt to continue prior to admission nutritional regimen  Filed Weights   06/25/14 0804 06/26/14 0100 06/27/14 0543  Weight: 56.7 kg (125 lb) 63.957 kg (141 lb) 61.689 kg (136 lb)    History of present illness:  Based on HPI  24 yo M Hx cerebral palsy, spastic quadriplegia, wheelchair mobile, seizure disorder, has Port-A-Cath, PEG tube, hx of pancytopenia attributed to Keppra which was discontinued, OSA on BiPAP who presented with fever and cough. Per patient's foster mother patient had been having fever for 4 days. His temperature was 101.8 at one point. Patient had mild cough with clear and thick sputum production. No dysuria, diarrhea, abdominal pain per caregiver. He was treated with Tylenol and ibuprofen by foster mother with some improvement.   In ED, patient was found to have WBC 1.6, negative urinalysis, lactate 1.48, temperature 104.1, tachycardia. Chest x-ray was negative for acute abnormalities.   Hospital  Course:  Fever - resolved with no antibiotics on board. No fever in the prior 24 hours - WBC within normal limits on last check at 4.3 - Oncology evaluated patient this am and recommended d/c as to avoid iatrogenic infection exposure. Mother requesting discharge  Anasarca -TTE noted normal ejection fraction - edema appears to be primarily related to low oncotic state (albumen 2.5) as well as immobile status - baseline weight appears to be approximately 56 kg - Patient will continue his home lasix regimen at home. 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  Sizure disorder -allergic to several common seizure medications -currently on Clobazam (Onfi) 6.25 mg TID -No evidence of active seizures at this time   Procedures: - Bone marrow biopsy  Consultations: Pediatric Neurology Oncology  Discharge Exam: Filed Vitals:   06/27/14 0543  BP: 97/47  Pulse: 73  Temp: 97.6 F (36.4 C)  Resp: 24    General: Pt in NAD, Alert and Awake Cardiovascular: RRR, no rubs Respiratory: No increased work of breathing, equal chest rise, no audible wheezes  Discharge Instructions   Discharge Instructions    Call MD for:  difficulty breathing, headache or visual disturbances    Complete by:  As directed      Call MD for:  severe uncontrolled pain    Complete by:  As directed      Call MD for:  temperature >100.4    Complete by:  As directed  Diet - low sodium heart healthy    Complete by:  As directed      Discharge instructions    Complete by:  As directed   Discharge home and resume home health services on discharge.     Increase activity slowly    Complete by:  As directed           Current Discharge Medication List    CONTINUE these medications which have NOT CHANGED   Details  Acetaminophen (TYLENOL PO) Place 650 mg into feeding tube  every 6 (six) hours as needed (fever).    albuterol (PROVENTIL HFA;VENTOLIN HFA) 108 (90 BASE) MCG/ACT inhaler Inhale 2 puffs into the lungs every 4 (four) hours as needed for shortness of breath.    albuterol (PROVENTIL) (2.5 MG/3ML) 0.083% nebulizer solution Take 2.5 mg by nebulization 2 (two) times daily as needed. 1 ampule twice daily and as needed (making 4 doses daily). Take a 8am and 8pm and then prn 6 and 7 are not used at the same time    Ascorbic Acid (VITAMIN C) 500 MG/5ML LIQD 300 mg by PEG Tube route every morning.     baclofen (GABLOFEN) 40000 MCG/20ML SOLN 385.2 mcg by Intrathecal route continuous.     calcium carbonate, dosed in mg elemental calcium, 1250 MG/5ML by PEG Tube route 3 (three) times daily. 5 cc via peg tube. Give at 8am , 2pm, and 8pm    cloBAZam (ONFI) 2.5 MG/ML solution Place 2.5 mL in gastrostomy tube 3 times daily Qty: 240 mL, Refills: 5   Associated Diagnoses: Generalized convulsive epilepsy with intractable epilepsy; Localization-related symptomatic epilepsy and epileptic syndromes with complex partial seizures, intractable, without status epilepticus    DIASTAT ACUDIAL 20 MG GEL Place 12.5 mg rectally as needed. For seizure lasting 2 minutes or longer or repetitive seizures.  No more than 1 dose every 12 hours or nasal versed (not both). Refills: 5    dicyclomine (BENTYL) 10 MG/5ML syrup Place 10 mg into feeding tube every 8 (eight) hours as needed. Not to exceed 5 doses in 1 wk as needed for Abd cramps    folic acid (FOLVITE) 1 MG tablet 1 mg by Feeding Tube route daily. 8:00am per G tube    furosemide (LASIX) 10 MG/ML solution Place 20 mg into feeding tube daily as needed for fluid.     gabapentin (NEURONTIN) 250 MG/5ML solution Take 18 mL 3 times daily Qty: 1620 mL, Refills: 5   Associated Diagnoses: Hip pain, unspecified laterality    ibuprofen (ADVIL,MOTRIN) 100 MG/5ML suspension Place 400 mg into feeding tube every 6 (six) hours as needed for  fever (pain).    ketoconazole (NIZORAL) 2 % cream Apply 1 application topically 2 (two) times daily as needed for irritation (yeast rash from antibiotics).     lansoprazole (PREVACID SOLUTAB) 30 MG disintegrating tablet 30 mg by Feeding Tube route 2 (two) times daily. 7:30am and 7:30pm    midazolam (VERSED) 5 MG/ML injection Place 2 mLs (10 mg total) into the nose once. Draw up 23m in 2 syringes. Remove blue vial access device. Attach syringe to nasal atomizer for intranasal administration. Give 173min right nostril x 2 for seizures lasting 2 minutes or longer or for repetitive seizures in a short period of time. Qty: 6 mL, Refills: 3    Multiple Vitamins-Minerals (MULTIVITAMIN) LIQD 10 mLs by Feeding Tube route daily.     Multiple Vitamins-Minerals (ZINC PO) 5 mLs by PEG Tube route daily. 244 mg /  5 mL zinc solution    mupirocin ointment (BACTROBAN) 2 % Apply 1 application topically as needed (to G-T site). Qty: 22 g, Refills: 1    !! Nutritional Supplements (PROMOD PO) 30 mLs by Feeding Tube route 2 (two) times daily. Give at 10am and 5pm per  g tube    !! Nutritional Supplements (TWOCAL HN) LIQD 237 mLs by Feeding Tube route. T.3 can at 46 cc hour x 12 hours - water 172 cc pre and post each and extra 172 bid.    OnabotulinumtoxinA (BOTOX IJ) Inject 300 Units as directed See admin instructions. Every 3 months    OVER THE COUNTER MEDICATION Apply 1 application topically as needed (after diaper changes). Balmex and Bag Balm paste made and applied after diaper changes    OXYGEN-HELIUM IN Inhale into the lungs. Oxygen PRN to keep O2 Sat at 90%    potassium chloride (KLOR-CON) 20 MEQ packet 20 mEq by Feeding Tube route daily.     Pseudoephedrine HCl (SUDAFED CHILDRENS) 15 MG/5ML LIQD Give 90 mg by tube 3 (three) times daily.     simethicone (MYLICON) 778 MG chewable tablet 125 mg by Feeding Tube route every 6 (six) hours as needed for flatulence.     sodium phosphate (FLEET) enema Place  1 enema rectally daily as needed. follow package directions PRN. Help get pass gas    sucralfate (CARAFATE) 1 GM/10ML suspension Place 1 g into feeding tube 4 (four) times daily. 7am, 130pm, 5pm, 8pm   Alone X 90mn    Water For Irrigation, Sterile (FREE WATER) SOLN Place 172 mLs into feeding tube 4 (four) times daily.    zinc oxide (BALMEX) 11.3 % CREA cream Apply 1 application topically 5 (five) times daily. With each diaper change.     !! - Potential duplicate medications found. Please discuss with provider.     Allergies  Allergen Reactions  . Vimpat [Lacosamide] Rash  . Adhesive [Tape]     Rips skin off  . Depakote [Divalproex Sodium]     Causes pancreatitis   . Keppra [Levetiracetam] Other (See Comments)    Bone marrow suppression  . Amoxicillin-Pot Clavulanate Rash    Give w fluconazole then no rash  . Neulasta [Pegfilgrastim] Palpitations      The results of significant diagnostics from this hospitalization (including imaging, microbiology, ancillary and laboratory) are listed below for reference.    Significant Diagnostic Studies: Ct Biopsy  06/26/2014   CLINICAL DATA:  Pancytopenia.  Need for bone marrow biopsy.  EXAM: CT GUIDED ASPIRATE AND CORE BONE MARROW BIOPSY OF RIGHT ILIAC BONE  ANESTHESIA/SEDATION: No conscious sedation was administered.  PROCEDURE: The procedure risks, benefits, and alternatives were explained to the patient's mother. Questions regarding the procedure were encouraged and answered. The patient's mother understands and consents to the procedure.  The right gluteal region was prepped with Betadine. Sterile gown and sterile gloves were used for the procedure. Local anesthesia was provided with 1% Lidocaine.  Under CT guidance, an 11 gauge OnControl bone cutting needle was advanced from a posterior approach into the right iliac bone. Needle positioning was confirmed with CT. Initial non heparinized and heparinized aspirate samples were obtained of bone  marrow.  Core biopsy was performed through the OnControl needle. Single core biopsy sample was obtained.  COMPLICATIONS: None  FINDINGS: Due to the patient's cerebral palsy, the procedure was performed in a decubitus position with the left side down in order to maintain the airway in a more stable body  position. The biopsy was technically successful. Inspection of initial aspirate did reveal visible particles. An intact core biopsy sample was obtained.  IMPRESSION: CT guided bone marrow biopsy of right posterior iliac bone with both aspirate and core samples obtained.   Electronically Signed   By: Aletta Edouard M.D.   On: 06/26/2014 13:59   Dg Chest Port 1 View  06/19/2014   CLINICAL DATA:  Fever since 06/15/2014.  EXAM: PORTABLE CHEST - 1 VIEW  COMPARISON:  Single view of the chest 05/11/2014 and 04/12/2014.  FINDINGS: Lung volumes are low as on the comparison examinations. Port-A-Cath is again seen. The lungs appear clear. Heart size is normal. No pneumothorax or pleural effusion. Spinal fusion hardware for scoliosis is again seen.  IMPRESSION: No acute disease.   Electronically Signed   By: Inge Rise M.D.   On: 06/19/2014 18:40   Dg Abd Portable 1v  06/21/2014   CLINICAL DATA:  Patient with history of small bowel obstruction.  EXAM: PORTABLE ABDOMEN - 1 VIEW  COMPARISON:  Abdominal radiograph 09/15/2012  FINDINGS: Gas is demonstrated within nondilated loops of large and small bowel in a nonobstructed pattern. Supine evaluation limited for the detection of free intraperitoneal air. Persistent elevation of the right hemidiaphragm. Minimal heterogeneous opacities left lung base. Spinal fusion hardware. Right lower quadrant stimulator device. G-tube projects over the left upper quadrant.  IMPRESSION: Nonobstructed bowel gas pattern.  Minimal heterogeneous opacities left lung base may represent atelectasis.   Electronically Signed   By: Lovey Newcomer M.D.   On: 06/21/2014 17:57    Microbiology: Recent  Results (from the past 240 hour(s))  Urine culture     Status: None   Collection Time: 06/19/14  6:47 PM  Result Value Ref Range Status   Specimen Description URINE, CATHETERIZED  Final   Special Requests NONE  Final   Colony Count NO GROWTH Performed at [facility]   Final   Culture NO GROWTH Performed at [facility]   Final   Report Status 06/20/2014 FINAL  Final  Blood Culture (routine x 2)     Status: None   Collection Time: 06/19/14  7:10 PM  Result Value Ref Range Status   Specimen Description BLOOD LEFT HAND  Final   Special Requests BOTTLES DRAWN AEROBIC AND ANAEROBIC 3ML  Final   Culture   Final    NO GROWTH 5 DAYS Performed at [facility]    Report Status 06/26/2014 FINAL  Final  Blood Culture (routine x 2)     Status: None   Collection Time: 06/19/14  7:20 PM  Result Value Ref Range Status   Specimen Description BLOOD LEFT HAND  Final   Special Requests BOTTLES DRAWN AEROBIC AND ANAEROBIC 5CC  Final   Culture   Final    NO GROWTH 5 DAYS Performed at [facility]    Report Status 06/26/2014 FINAL  Final  MRSA PCR Screening     Status: None   Collection Time: 06/19/14 11:48 PM  Result Value Ref Range Status   MRSA by PCR NEGATIVE NEGATIVE Final    Comment:        The GeneXpert MRSA Assay (FDA approved for NASAL specimens only), is one component of a comprehensive MRSA colonization surveillance program. It is not intended to diagnose MRSA infection nor to guide or monitor treatment for MRSA infections.   Culture, blood (routine x 2)     Status: None   Collection Time: 06/20/14  5:43 PM  Result Value Ref Range Status   Specimen Description BLOOD PORTA CATH  Final   Special Requests BOTTLES DRAWN AEROBIC AND ANAEROBIC 5ML  Final   Culture   Final    NO GROWTH 5 DAYS Performed at [facility]    Report Status 06/27/2014 FINAL  Final  Culture, blood (single)     Status: None   Collection Time: 06/21/14  5:20 AM   Result Value Ref Range Status   Specimen Description BLOOD LEFT HAND  Final   Special Requests BOTTLES DRAWN AEROBIC ONLY 5CC  Final   Culture   Final    NO GROWTH 5 DAYS Performed at [facility]    Report Status 06/27/2014 FINAL  Final  Clostridium Difficile by PCR     Status: None   Collection Time: 06/23/14 12:01 PM  Result Value Ref Range Status   C difficile by pcr NEGATIVE NEGATIVE Final     Labs: Basic Metabolic Panel:  Recent Labs Lab 06/23/14 0545  06/23/14 1355 06/24/14 0600 06/24/14 1300 06/25/14 0450 06/26/14 0545 06/27/14 0700  NA 134*  --   --  134*  --  142 140 139  K 2.8*  < > 3.9 3.0*  --  3.9 3.5 2.8*  CL 100*  --   --  99*  --  105 99* 92*  CO2 25  --   --  27  --  29 34* 35*  GLUCOSE 121*  --   --  101*  --  91 94 79  BUN 8  --   --  <5*  --  <5* <5* <5*  CREATININE 0.45*  --   --  0.44*  --  0.47* 0.46* 0.57*  CALCIUM 8.0*  --   --  8.4*  --  9.3 9.2 9.4  MG  --   --  1.8  --  1.9 1.7 1.8 1.6*  PHOS  --   --   --   --   --   --   --  3.7  < > = values in this interval not displayed. Liver Function Tests:  Recent Labs Lab 06/22/14 0526 06/23/14 0545 06/24/14 0600 06/25/14 0450 06/27/14 0700  AST 17 19 27  32 37  ALT 11* 23 36 46 51  ALKPHOS 42 72 91 104 117  BILITOT 0.5 0.8 1.1 0.8 0.6  PROT 5.4* 4.8* 5.1* 4.8* 4.9*  ALBUMIN 2.9* 2.1* 2.4* 2.5* 2.5*   No results for input(s): LIPASE, AMYLASE in the last 168 hours. No results for input(s): AMMONIA in the last 168 hours. CBC:  Recent Labs Lab 06/23/14 0545 06/24/14 0600 06/25/14 0450 06/26/14 0545 06/27/14 0700  WBC 3.5* 2.5* 2.0* 1.6* 4.3  NEUTROABS 2.5 1.4* 0.8* 0.6* 2.4  HGB 8.0* 9.2* 9.5* 9.1* 9.3*  HCT 25.2* 28.3* 30.0* 29.4* 31.1*  MCV 98.8 98.3 100.0 102.4* 103.7*  PLT 113* 121* 134* 148* 151   Cardiac Enzymes: No results for input(s): CKTOTAL, CKMB, CKMBINDEX, TROPONINI in the last 168 hours. BNP: BNP (last 3 results) No results for input(s): BNP in the  last 8760 hours.  ProBNP (last 3 results) No results for input(s): PROBNP in the last 8760 hours.  CBG:  Recent Labs Lab 06/26/14 1428 06/26/14 2101 06/27/14 06/27/14 0426 06/27/14 0757  GLUCAP 110* 77 84 83 77       Signed:  Leno Mathes  [facility] 06/27/2014, 10:40 AM  Patient had concerns for hypoglycemia when transitioning patient home. She requested we continue  patient's d10 instead of starting feeds. As part of progressing patient I indicated that patient will require nutrition and that we needed to start feeding prior to discharge. Patient tolerated tube feeds and will be discharged to home with home health services.  When I was trying to explain the importance of starting his nutrition she stated that she did not want to talk to me and wished I leave.  Quavis Klutz, [employer]

## 2014-06-27 NOTE — Progress Notes (Addendum)
Nutrition Follow-up  DOCUMENTATION CODES:  Not applicable  INTERVENTION:  Tube feeding, Prostat   Initiate Two Cal HN at 20 ml/hr and advance 10 ml/hr every 4 hours to goal rate of 46 ml/hr via J-port x 12 hours (7 AM-7 PM)  Prostat liquid protein 30 ml BID  Total TF regimen to provide 1304 kcals, 76 gm protein, 386 ml of free water  NUTRITION DIAGNOSIS:  Inadequate oral intake related to inability to eat, chronic illness as evidenced by NPO status.  Ongoing  GOAL:  Patient will meet greater than or equal to 90% of their needs  Unmet  MONITOR:  TF tolerance, Labs, Weight trends, I & O's  REASON FOR ASSESSMENT:  Low Braden    ASSESSMENT: 24 y.o. Male with PMH of history of cerebral palsy, spastic quadriplegia, wheelchair mobile, seizure disorder, has Port-A-Cath, G-J tube, hx of pancytopenia attributed to Keppra which was discontinued, OSA on BiPAP who presents with fever and cough.  RD received call from RN to place TF orders- per RN, pt has full order writing privileges for TF per MD.   Spoke with RN prior to arriving to pt's room. Plan is to start TF and to check CBGS due to discontinuing D10.   Spoke with pt's legal guardian extensively along with RN at bedside (about 45 minutes). She expressed multiple concerns about pt's health, due to his multiple medical issues, including respiratory status, malnutrition, and hypoglycemia. Typical home regimen is Two Cal HN at 46 ml/hr via J-port x 12 hours (7 AM-7 PM) and Prostat liquid protein 30 ml BID. Total regimen provides 1304 kcals, 76 gm protein, 386 ml of free water. She reveals that TF does not always run continuously and she may have to stop TF in the middle of the night if his respiratory status declines, but will adjust the rate to ensure he receives the proper volume of TF.   RN reports that pt has had minimal nutrition this hospitalization. Pt guardian confirms this statement, reporting that she was holding full  dose TF due to pt's acute medial issues, particularly his respiratory status. She reports his last full dose TF was on 06/18/14.  She is agreeable to start TF to expedite his discharge from the hospital. However, she expresses concern about possible aspiration and hypoglycemia. Pt tolerates TF well at home and she requests that he continue on the TwoCal HN formula. Informed pt guardian that TwoCal HN is not on hospital formulary and offered to write TF orders for formulary substitution. Pt guardian requested to use home supply of formula, which she already has in pt room, but is agreeable to use the hospital-supplied feeding pump.   Due to his limited nutritional intake over the past several days and multiple medical issues, discussed with pt guardian about titrating TF to goal rate instead of starting initially at goal rate. Described plan for TF titration in detail to pt guardian and she stated agreement with the plan. RD interventions also discussed with RN.   Pt guardian expressed appreciation of visit and denied any further nutritional concerns at this time.   Height:  Ht Readings from Last 1 Encounters:  06/25/14 4\' 2"  (1.27 m)    Weight:  Wt Readings from Last 1 Encounters:  06/27/14 136 lb (61.689 kg)    Ideal Body Weight:  46.8 kg  Wt Readings from Last 10 Encounters:  06/27/14 136 lb (61.689 kg)  06/03/14 124 lb (56.246 kg)  05/11/14 122 lb 5.7 oz (55.5 kg)  04/13/14 127 lb (57.607 kg)  04/03/14 129 lb (58.514 kg)  03/02/14 134 lb 0.6 oz (60.8 kg)  12/12/13 128 lb (58.06 kg)  04/17/13 124 lb (56.246 kg)  12/25/12 116 lb (52.617 kg)  11/29/12 116 lb 2.9 oz (52.7 kg)    BMI:  Body mass index is 38.25 kg/(m^2).  Estimated Nutritional Needs:  Kcal:  1450-1550  Protein:  65-80 gm  Fluid:  >/= 1.5 L  Skin:  Reviewed, no issues (closed incision to rt medial back)  Diet Order:  Diet NPO time specified Diet - low sodium heart healthy  EDUCATION NEEDS:  No education  needs identified at this time   Intake/Output Summary (Last 24 hours) at 06/27/14 1623 Last data filed at 06/27/14 1445  Gross per 24 hour  Intake  234.5 ml  Output   2375 ml  Net -2140.5 ml    Last BM:  06/26/14  Johnothan Bascomb A. Jimmye Norman, RD, LDN, CDE Pager: [phone] After hours Pager: [phone]

## 2014-06-27 NOTE — Progress Notes (Signed)
[name] provides home RN. Order placed to resume home services at discharge. Miranda at [facility] notified and aware, awaiting discharge

## 2014-06-27 NOTE — Telephone Encounter (Signed)
[name] left a message to notify Dr Gaynell Face that [name] is being discharged today. She said no need for you to call back. TG

## 2014-06-27 NOTE — Clinical Social Work Note (Signed)
Unit director requested assistance with transport for patient home. CSW has printed facesheet and medical necessity form for director and has provided unit with number to request transport once patient can DC. CSW signing off.   Liz Beach MSW, [place], [place], [phone]

## 2014-06-27 NOTE — Consult Note (Signed)
[facility] Procedure Note  Patient name: Jeffery [name] Medical record number: [id number] Date of birth: 1990-05-23 Age: 24 y.o. Gender: male    LOS: 8 days   Primary Care Provider: Marijean Bravo, MD  Overnight Events: [name] was stable overnight.  I spoke with his mother and she said that he gained 19 pounds.  The medical record shows that he is up 12 pounds since May 2.  My understanding is that this is related to hypoalbuminemia.  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  He has been treated with intrathecal baclofen for a number of years.  The pump was due to be refilled tomorrow.  He is hospitalized and therefore I came to the hospital to empty refill and reprogram his pump because I will not be in town tomorrow.  Empty, refill, and reprogram intrathecal baclofen pump  [name] receives a complex continuous infusion. His basal rate is 12.4 mcg per hour. Bolus doses are 25 mcg at 4:30 AM, 8:30 AM, 11:30 AM, and 6 PM. These are infused over 15 minutes. Total daily dose is 385.2 mcg.    After informed consent was obtained followed by a timeout procedure, the reservoir was entered with a 21-gauge non-coring Heubner needle inserted on the 3rd pass. 1.5 mL of baclofen was withdrawn and discarded placing the pump under partial vacuum. The contents of a 20 mL syringe of baclofen, concentration 2000 mcg/mL, were instilled into the reservoir which was reprogrammed to reflect a 20 mL volume. The rate of infusion was unchanged as noted above .   Refill interval is 93 days. Alarm date is September 28, 2014. He will return on or about then. Estimated ERI 12 months. He tolerated the procedure well.  Objective: Vital signs in last 24 hours: Temp:  [97.6 F (36.4 C)-98.4 F (36.9 C)] 97.6 F (36.4 C) (05/26 0543) Pulse Rate:  [73-109] 73 (05/26 0543) Resp:  [16-25] 24 (05/26 0543) BP: (97-119)/(47-92) 97/47 mmHg (05/26 0543) SpO2:  [96  %-100 %] 99 % (05/26 0833) FiO2 (%):  [2 %] 2 % (05/25 1210) Weight:  [136 lb (61.689 kg)] 136 lb (61.689 kg) (05/26 0543)  Wt Readings from Last 3 Encounters:  06/27/14 136 lb (61.689 kg)  06/03/14 124 lb (56.246 kg)  05/11/14 122 lb 5.7 oz (55.5 kg)     Intake/Output Summary (Last 24 hours) at 06/27/14 0903 Last data filed at 06/27/14 0546  Gross per 24 hour  Intake 585.67 ml  Output   1175 ml  Net -589.33 ml    Current Facility-Administered Medications  Medication Dose Route Frequency Provider Last Rate Last Dose  . 0.9 %  sodium chloride infusion   Intravenous Continuous Cherene Altes, MD 10 mL/hr at 06/26/14 1033    . acetaminophen (TYLENOL) solution 650 mg  650 mg Per Tube Q6H PRN Cherene Altes, MD   650 mg at 06/26/14 1559  . antiseptic oral rinse (CPC / CETYLPYRIDINIUM CHLORIDE 0.05%) solution 7 mL  7 mL Mouth Rinse q12n4p Margot Ables, RN   7 mL at 06/26/14 1602  . bacitracin ointment   Topical BID Allie Bossier, MD      . baclofen (GABLOFEN) intrathecal injection 40000 mcg/61m  385.2 mcg Intrathecal Continuous XIvor Costa MD      . calcium carbonate (dosed in mg elemental calcium) suspension 500 mg of elemental calcium  500 mg of elemental calcium Per Tube TID JCherene Altes MD  500 mg of elemental calcium at 06/26/14 2352  . cloBAZam (ONFI) 2.5 MG/ML oral suspension 6.25 mg  6.25 mg Per Tube TID Ivor Costa, MD   6.25 mg at 06/27/14 0011  . dextrose 10 % infusion   Intravenous Continuous Rhetta Mura Schorr, NP 50 mL/hr at 06/26/14 2120 50 mL at 06/26/14 2120  . diazepam (DIASTAT ACUDIAL) rectal kit 10 mg  10 mg Rectal PRN Ivor Costa, MD      . dicyclomine (BENTYL) 10 MG/5ML syrup 10 mg  10 mg Per Tube TID AC Ivor Costa, MD   10 mg at 06/25/14 1549  . diphenoxylate-atropine (LOMOTIL) 2.5-0.025 MG/5ML liquid 5 mL  5 mL Oral QID PRN Allie Bossier, MD   5 mL at 06/24/14 0010  . filgrastim (NEUPOGEN) injection 480 mcg  480 mcg Subcutaneous Daily Volanda Napoleon, MD    480 mcg at 06/26/14 1723  . folic acid (FOLVITE) tablet 1 mg  1 mg Per Tube Daily Ivor Costa, MD   1 mg at 06/25/14 1115  . free water 172 mL  172 mL Per Tube TID AC & HS Ivor Costa, MD   172 mL at 06/26/14 2300  . furosemide (LASIX) injection 20 mg  20 mg Intravenous Q12H Cherene Altes, MD   20 mg at 06/26/14 2330  . gabapentin (NEURONTIN) 250 MG/5ML solution 900 mg  900 mg Per Tube TID Ivor Costa, MD   900 mg at 06/26/14 2330  . guaiFENesin-dextromethorphan (ROBITUSSIN DM) 100-10 MG/5ML syrup 5 mL  5 mL Per Tube Q4H PRN Ivor Costa, MD      . heparin injection 5,000 Units  5,000 Units Subcutaneous 3 times per day Monia Sabal, PA-C   5,000 Units at 06/27/14 [id number]  . ibuprofen (ADVIL,MOTRIN) 100 MG/5ML suspension 400 mg  400 mg Per Tube Q6H PRN Cherene Altes, MD      . ketoconazole (NIZORAL) 2 % cream 1 application  1 application Topical BID PRN Ivor Costa, MD      . levalbuterol Penne Lash) nebulizer solution 0.63 mg  0.63 mg Nebulization Q6H Allie Bossier, MD   0.63 mg at 06/27/14 0831  . levalbuterol (XOPENEX) nebulizer solution 0.63 mg  0.63 mg Nebulization Q2H PRN Cherene Altes, MD   0.63 mg at 06/26/14 0024  . lidocaine (XYLOCAINE) 2 % viscous mouth solution 15 mL  15 mL Mouth/Throat Once Allie Bossier, MD      . methylPREDNISolone sodium succinate (SOLU-MEDROL) 125 mg/2 mL injection 60 mg  60 mg Intravenous Q24H Allie Bossier, MD   60 mg at 06/27/14 0655  . miconazole (MICOTIN) 2 % cream   Topical BID PRN Cherene Altes, MD      . multivitamin liquid 10 mL  10 mL Per Tube Daily Ivor Costa, MD   10 mL at 06/25/14 1114  . mupirocin ointment (BACTROBAN) 2 % 1 application  1 application Topical PRN Ivor Costa, MD      . ondansetron [facility]) tablet 4 mg  4 mg Oral Q6H PRN Ivor Costa, MD       Or  . ondansetron [facility]) injection 4 mg  4 mg Intravenous Q6H PRN Ivor Costa, MD      . pantoprazole sodium (PROTONIX) 40 mg/20 mL oral suspension 40 mg  40 mg Per Tube BID Ivor Costa, MD   40 mg  at 06/26/14 2200  . potassium chloride 20 MEQ/15ML (10%) solution 20 mEq  20 mEq Per Tube BID Kimberlee Nearing  Thereasa Solo, MD   20 mEq at 06/26/14 2330  . pseudoephedrine (SUDAFED) 30 MG/5ML syrup 90 mg  90 mg Per Tube 3 times per day Allie Bossier, MD   90 mg at 06/27/14 0655  . simethicone (MYLICON) 40 NU/2.7OZ suspension 80 mg  80 mg Oral TID AC & HS Allie Bossier, MD   80 mg at 06/26/14 2330  . sodium chloride 0.9 % injection 3 mL  3 mL Intravenous Q12H Ivor Costa, MD   3 mL at 06/26/14 1534  . sodium phosphate (FLEET) 7-19 GM/118ML enema 1 enema  1 enema Rectal Daily PRN Ivor Costa, MD      . sucralfate (CARAFATE) 1 GM/10ML suspension 1 g  1 g Per Tube TID AC & HS Ivor Costa, MD   1 g at 06/26/14 2130  . vitamin C (ASCORBIC ACID) tablet 250 mg  250 mg Per Tube Daily Ivor Costa, MD   250 mg at 06/25/14 1100  . zinc oxide (BALMEX) 36.6 % cream 1 application  1 application Topical 5 X Daily Ivor Costa, MD   1 application at 44/03/47 0600    PE: I did not examine him other than to note that he has edema in his legs.  He did not seem significantly edematous and his cutaneous abdomen, but his stomach is somewhat distended.  He had clonus of his legs that seems to be more prominent when he is lying down.  He appeared to be comfortable and not in pain. He was awake and lethargic.  Labs/Studies: He continues to show significant pancytopenia.  Questions at been raised about the role of Onfi and causing him to apparently relapse.  A bone marrow biopsy was performed yesterday and results will be available tomorrow.  He has had so many allergies to different antiepileptic drugs and has a difficult to control seizure disorder that I am not anxious to stop a medication that seems to have controlled his seizures are well.  I felt the same way about levetiracetam, but I'm certain that that was responsible for his pancytopenia despite being on the medication for years without trouble.  Assessment 1. 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 2. 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 3. 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 4. Dysphagia, R13.10. 5. History of pancytopenia, Z86.2.   6.   Hip pain unspecified laterality, M25.559.   7.   Hypoalbuminemia  Discussion I successfully emptied refilled and reprogrammed his vehicle pump area he will be seen in approximately 3 months for refill.  We are going to physical difficult decision year from now when the pump should be replaced.  I don't know if he'll physically be able to tolerate the procedure.  Plan We will schedule his procedure to empty refill and reprogram his pump at a later date.  SignedJodi Geralds, MD Child neurology attending [phone] 06/27/2014 9:03 AM

## 2014-06-27 NOTE — Progress Notes (Signed)
Utilization Review completed. Terese Heier RN BSN CM 

## 2014-06-27 NOTE — Progress Notes (Signed)
Patient discharge teaching given, including activity, diet, follow-up appoints, and medications. Patient verbalized understanding of all discharge instructions. IV access was d/c'd. Vitals are stable. Skin is intact except as charted in most recent assessments. Pt to be transported home via [facility].

## 2014-06-28 ENCOUNTER — Encounter: Payer: Medicaid Other | Admitting: Pediatrics

## 2014-07-02 ENCOUNTER — Telehealth: Payer: Self-pay | Admitting: *Deleted

## 2014-07-02 NOTE — Telephone Encounter (Signed)
I called and talked to Mom. She said that Jeffery [name] was doing well after being discharged from the hospital, other than some blood sugar fluctuations. On Sunday morning, she went in to recheck a blood sugar and found him in grand mal seizure. She said that unlike other times, his 02 sat was reading 87% and had not alarmed (because the settings are below that number). She said that he was on bipap at the time, and seemed to be tolerating the seizure behavior. She said that she just wanted Dr Gaynell Face to know about the seizure and said that she did not want to make any changes in his plan of care. I told her that Dr Gaynell Face was out of the office today but that I would relay the message for when he returns tomorrow. TG

## 2014-07-02 NOTE — Telephone Encounter (Signed)
[name], patient's mother, called and left a voicemail requesting a call back because patient had a seizure Sunday morning and would like to explain how she caught it.   CB#: [phone]

## 2014-07-03 ENCOUNTER — Ambulatory Visit (HOSPITAL_BASED_OUTPATIENT_CLINIC_OR_DEPARTMENT_OTHER): Payer: Medicaid Other | Admitting: Hematology & Oncology

## 2014-07-03 ENCOUNTER — Encounter: Payer: Self-pay | Admitting: Hematology & Oncology

## 2014-07-03 ENCOUNTER — Other Ambulatory Visit (HOSPITAL_BASED_OUTPATIENT_CLINIC_OR_DEPARTMENT_OTHER): Payer: Medicaid Other

## 2014-07-03 ENCOUNTER — Ambulatory Visit (HOSPITAL_BASED_OUTPATIENT_CLINIC_OR_DEPARTMENT_OTHER): Payer: Medicaid Other

## 2014-07-03 VITALS — BP 119/76 | HR 126 | Temp 97.6°F | Resp 18

## 2014-07-03 DIAGNOSIS — D4620 Refractory anemia with excess of blasts, unspecified: Secondary | ICD-10-CM

## 2014-07-03 DIAGNOSIS — D709 Neutropenia, unspecified: Secondary | ICD-10-CM

## 2014-07-03 DIAGNOSIS — D61818 Other pancytopenia: Secondary | ICD-10-CM

## 2014-07-03 DIAGNOSIS — Z452 Encounter for adjustment and management of vascular access device: Secondary | ICD-10-CM

## 2014-07-03 DIAGNOSIS — R5081 Fever presenting with conditions classified elsewhere: Secondary | ICD-10-CM

## 2014-07-03 HISTORY — DX: Neutropenia, unspecified: D70.9

## 2014-07-03 LAB — CBC WITH DIFFERENTIAL (CANCER CENTER ONLY)
BASO#: 0 10*3/uL (ref 0.0–0.2)
BASO%: 0.3 % (ref 0.0–2.0)
EOS%: 3.3 % (ref 0.0–7.0)
Eosinophils Absolute: 0.1 10*3/uL (ref 0.0–0.5)
HEMATOCRIT: 36.3 % — AB (ref 38.7–49.9)
HEMOGLOBIN: 11.6 g/dL — AB (ref 13.0–17.1)
LYMPH#: 0.4 10*3/uL — AB (ref 0.9–3.3)
LYMPH%: 13.1 % — AB (ref 14.0–48.0)
MCH: 34.5 pg — ABNORMAL HIGH (ref 28.0–33.4)
MCHC: 32 g/dL (ref 32.0–35.9)
MCV: 108 fL — ABNORMAL HIGH (ref 82–98)
MONO#: 0.4 10*3/uL (ref 0.1–0.9)
MONO%: 12.8 % (ref 0.0–13.0)
NEUT#: 2.4 10*3/uL (ref 1.5–6.5)
NEUT%: 70.5 % (ref 40.0–80.0)
Platelets: 75 10*3/uL — ABNORMAL LOW (ref 145–400)
RBC: 3.36 10*6/uL — AB (ref 4.20–5.70)
RDW: 20.1 % — AB (ref 11.1–15.7)
WBC: 3.4 10*3/uL — ABNORMAL LOW (ref 4.0–10.0)

## 2014-07-03 LAB — CMP (CANCER CENTER ONLY)
ALK PHOS: 166 U/L — AB (ref 26–84)
ALT(SGPT): 36 U/L (ref 10–47)
AST: 24 U/L (ref 11–38)
Albumin: 3 g/dL — ABNORMAL LOW (ref 3.3–5.5)
BILIRUBIN TOTAL: 1 mg/dL (ref 0.20–1.60)
BUN, Bld: 17 mg/dL (ref 7–22)
CALCIUM: 9.5 mg/dL (ref 8.0–10.3)
CO2: 32 mEq/L (ref 18–33)
CREATININE: 0.7 mg/dL (ref 0.6–1.2)
Chloride: 96 mEq/L — ABNORMAL LOW (ref 98–108)
Glucose, Bld: 107 mg/dL (ref 73–118)
Potassium: 3.4 mEq/L (ref 3.3–4.7)
Sodium: 140 mEq/L (ref 128–145)
TOTAL PROTEIN: 6.5 g/dL (ref 6.4–8.1)

## 2014-07-03 LAB — RETICULOCYTES (CHCC)
ABS Retic: 99.1 10*3/uL (ref 19.0–186.0)
RBC.: 3.54 MIL/uL — ABNORMAL LOW (ref 4.22–5.81)
Retic Ct Pct: 2.8 % — ABNORMAL HIGH (ref 0.4–2.3)

## 2014-07-03 LAB — LACTATE DEHYDROGENASE: LDH: 129 U/L (ref 94–250)

## 2014-07-03 LAB — CHCC SATELLITE - SMEAR

## 2014-07-03 MED ORDER — HEPARIN SOD (PORK) LOCK FLUSH 100 UNIT/ML IV SOLN
500.0000 [IU] | Freq: Once | INTRAVENOUS | Status: AC
  Administered 2014-07-03: 500 [IU] via INTRAVENOUS
  Filled 2014-07-03: qty 5

## 2014-07-03 MED ORDER — SODIUM CHLORIDE 0.9 % IJ SOLN
10.0000 mL | INTRAMUSCULAR | Status: DC | PRN
  Administered 2014-07-03: 10 mL via INTRAVENOUS
  Filled 2014-07-03: qty 10

## 2014-07-03 MED ORDER — FILGRASTIM 480 MCG/0.8ML IJ SOSY
480.0000 ug | PREFILLED_SYRINGE | Freq: Once | INTRAMUSCULAR | Status: AC
  Administered 2014-07-03: 480 ug via SUBCUTANEOUS
  Filled 2014-07-03: qty 0.8

## 2014-07-03 NOTE — Progress Notes (Signed)
Hematology and Oncology Follow Up Visit  Jeffery [name] [id number] [birth year] 23 y.o. 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  Jeffery [name] is back for follow-up. He was hospitalized a couple weeks ago. He had temperature up to 104. He had no obvious seizures. His blood counts were quite low.  After long debate and discussion, we decided to get a bone marrow on him. This was a very difficult decision for [place]. His mom agreed. He really did fantastic with the procedure.  The pathology report ([id number]) showed a hypercellular marrow with normal hematopoiesis. There were no increase in blasts. The features were not diagnostic of any bone marrow disorder.  I suspect that he may have some type of autoimmune issue.  We are still awaiting the chromosome studies.  Since he's been home, he's been doing really well. He looks really good today. He is much more alert. He does have the sweats. He's uses tube feeds. He's had no diarrhea.  As always, his parents are really dedicated to helping take care of him.  Medications:  Current outpatient prescriptions:  .  Acetaminophen (TYLENOL PO), Place 650 mg into feeding tube every 6 (six) hours as needed (fever)., Disp: , Rfl:  .  albuterol (PROVENTIL HFA;VENTOLIN HFA) 108 (90 BASE) MCG/ACT inhaler, Inhale 2 puffs into the lungs every 4 (four) hours as needed for shortness of breath., Disp: , Rfl:  .  albuterol (PROVENTIL) (2.5 MG/3ML) 0.083% nebulizer solution, Take 2.5 mg by nebulization 2 (two) times daily as needed. 1 ampule twice daily and as needed (making 4 doses daily). Take a 8am and 8pm and then prn 6 and 7 are not used at the same time, Disp: , Rfl:  .  Amino Acids-Protein Hydrolys (FEEDING SUPPLEMENT, PRO-STAT SUGAR FREE 64,) LIQD, Take 30 mLs by mouth 2 (two) times daily., Disp: 900 mL, Rfl: 0 .   Ascorbic Acid (VITAMIN C) 500 MG/5ML LIQD, 300 mg by PEG Tube route every morning. , Disp: , Rfl:  .  baclofen (GABLOFEN) 40000 MCG/20ML SOLN, 385.2 mcg by Intrathecal route continuous. , Disp: , Rfl:  .  calcium carbonate, dosed in mg elemental calcium, 1250 MG/5ML, by PEG Tube route 3 (three) times daily. 5 cc via peg tube. Give at 8am , 2pm, and 8pm, Disp: , Rfl:  .  cloBAZam (ONFI) 2.5 MG/ML solution, Place 2.5 mL in gastrostomy tube 3 times daily, Disp: 240 mL, Rfl: 5 .  DIASTAT ACUDIAL 20 MG GEL, Place 12.5 mg rectally as needed. For seizure lasting 2 minutes or longer or repetitive seizures.  No more than 1 dose every 12 hours or nasal versed (not both)., Disp: , Rfl: 5 .  dicyclomine (BENTYL) 10 MG/5ML syrup, Place 10 mg into feeding tube every 8 (eight) hours as needed. Not to exceed 5 doses in 1 wk as needed for Abd cramps, Disp: , Rfl:  .  folic acid (FOLVITE) 1 MG tablet, 1 mg by Feeding Tube route daily. 8:00am per G tube, Disp: , Rfl:  .  furosemide (LASIX) 10 MG/ML solution, Place 20 mg into feeding tube daily as needed for fluid. , Disp: , Rfl:  .  gabapentin (NEURONTIN) 250 MG/5ML solution, Take 18 mL 3 times daily (Patient taking differently: Place 900 mg into feeding tube 3 (three) times daily. Give 57mls tid at 8am, 2pm and 8pm), Disp:  1620 mL, Rfl: 5 .  ibuprofen (ADVIL,MOTRIN) 100 MG/5ML suspension, Place 400 mg into feeding tube every 6 (six) hours as needed for fever (pain)., Disp: , Rfl:  .  ketoconazole (NIZORAL) 2 % cream, Apply 1 application topically 2 (two) times daily as needed for irritation (yeast rash from antibiotics). , Disp: , Rfl:  .  lansoprazole (PREVACID SOLUTAB) 30 MG disintegrating tablet, 30 mg by Feeding Tube route 2 (two) times daily. 7:30am and 7:30pm, Disp: , Rfl:  .  midazolam (VERSED) 5 MG/ML injection, Place 2 mLs (10 mg total) into the nose once. Draw up 36ml in 2 syringes. Remove blue vial access device. Attach syringe to nasal atomizer for intranasal  administration. Give 70ml in right nostril x 2 for seizures lasting 2 minutes or longer or for repetitive seizures in a short period of time., Disp: 6 mL, Rfl: 3 .  Multiple Vitamins-Minerals (MULTIVITAMIN) LIQD, 10 mLs by Feeding Tube route daily. , Disp: , Rfl:  .  Multiple Vitamins-Minerals (ZINC PO), 5 mLs by PEG Tube route daily. 244 mg / 5 mL zinc solution, Disp: , Rfl:  .  mupirocin ointment (BACTROBAN) 2 %, Apply 1 application topically as needed (to G-T site)., Disp: 22 g, Rfl: 1 .  Nutritional Supplements (PROMOD PO), 30 mLs by Feeding Tube route 2 (two) times daily. Give at 10am and 5pm per  g tube, Disp: , Rfl:  .  Nutritional Supplements (TWOCAL HN) LIQD, 237 mLs by Feeding Tube route. T.3 can at 46 cc hour x 12 hours - water 172 cc pre and post each and extra 172 bid., Disp: , Rfl:  .  OnabotulinumtoxinA (BOTOX IJ), Inject 300 Units as directed See admin instructions. Every 3 months, Disp: , Rfl:  .  OVER THE COUNTER MEDICATION, Apply 1 application topically as needed (after diaper changes). Balmex and Bag Balm paste made and applied after diaper changes, Disp: , Rfl:  .  OXYGEN-HELIUM IN, Inhale into the lungs. Oxygen PRN to keep O2 Sat at 90%, Disp: , Rfl:  .  potassium chloride (KLOR-CON) 20 MEQ packet, 20 mEq by Feeding Tube route daily. , Disp: , Rfl:  .  Pseudoephedrine HCl (SUDAFED CHILDRENS) 15 MG/5ML LIQD, Give 90 mg by tube 3 (three) times daily. , Disp: , Rfl:  .  simethicone (MYLICON) 433 MG chewable tablet, 125 mg by Feeding Tube route every 6 (six) hours as needed for flatulence. , Disp: , Rfl:  .  sodium phosphate (FLEET) enema, Place 1 enema rectally daily as needed. follow package directions PRN. Help get pass gas, Disp: , Rfl:  .  sucralfate (CARAFATE) 1 GM/10ML suspension, Place 1 g into feeding tube 4 (four) times daily. 7am, 130pm, 5pm, 8pm   Alone X 2min, Disp: , Rfl:  .  Water For Irrigation, Sterile (FREE WATER) SOLN, Place 172 mLs into feeding tube 4 (four) times  daily., Disp: , Rfl:  .  zinc oxide (BALMEX) 11.3 % CREA cream, Apply 1 application topically 5 (five) times daily. With each diaper change., Disp: , Rfl:  No current facility-administered medications for this visit.  Facility-Administered Medications Ordered in Other Visits:  .  sodium chloride 0.9 % injection 10 mL, 10 mL, Intravenous, PRN, Volanda Napoleon, MD, 10 mL at 07/03/14 1423  Allergies:  Allergies  Allergen Reactions  . Vimpat [Lacosamide] Rash  . Adhesive [Tape]     Rips skin off  . Depakote [Divalproex Sodium]     Causes pancreatitis   . Keppra [Levetiracetam]  Other (See Comments)    Bone marrow suppression  . Amoxicillin-Pot Clavulanate Rash    Give w fluconazole then no rash  . Neulasta [Pegfilgrastim] Palpitations    Past Medical History, Surgical history, Social history, and Family History were reviewed and updated.  Review of Systems: As above  Physical Exam:  axillary temperature is 97.6 F (36.4 C). His blood pressure is 119/76 and his pulse is 126. His respiration is 18.   Special needs young male. He has obvious changes consistent with cerebral palsy. He has no obvious oral lesions. He has no adenopathy. Lungs are clear. Cardiac exam tachycardic rate and regular rhythm with no murmurs, rubs or bruits. Abdomen is soft. He is slightly obese. He has no obvious hepatosplenomegaly. Extremities shows joint changes consistent  with his cerebral palsy. He does have an implantable baclofen pump in the right lower quadrant of his abdomen. Skin exam shows no obvious rashes. There is no ecchymoses or petechia. Neurological exam shows the spasticity and dysarthria from his cerebral palsy  Lab Results  Component Value Date   WBC 3.4* 07/03/2014   HGB 11.6* 07/03/2014   HCT 36.3* 07/03/2014   MCV 108* 07/03/2014   PLT 75* 07/03/2014     Chemistry      Component Value Date/Time   NA 140 07/03/2014 1301   NA 139 06/27/2014 0700   K 3.4 07/03/2014 1301   K 2.8*  06/27/2014 0700   CL 96* 07/03/2014 1301   CL 92* 06/27/2014 0700   CO2 32 07/03/2014 1301   CO2 35* 06/27/2014 0700   BUN 17 07/03/2014 1301   BUN <5* 06/27/2014 0700   CREATININE 0.7 07/03/2014 1301   CREATININE 0.57* 06/27/2014 0700      Component Value Date/Time   CALCIUM 9.5 07/03/2014 1301   CALCIUM 9.4 06/27/2014 0700   ALKPHOS 166* 07/03/2014 1301   ALKPHOS 117 06/27/2014 0700   AST 24 07/03/2014 1301   AST 37 06/27/2014 0700   ALT 36 07/03/2014 1301   ALT 51 06/27/2014 0700   BILITOT 1.00 07/03/2014 1301   BILITOT 0.6 06/27/2014 0700     Normochromic and normocytic red blood cells. I see no nucleated red cells. There is no teardrop cells. There is no rouleau formation. There is no inclusion bodies. White cells. Normal in morphology and maturation. There are no hypersegmented polys. There is no immature myeloid or lymphoid forms. Platelets are mildly decreased in number.    Impression and Plan: Mr. Deboy is 24 year old male. His blood counts are doing better. His white cell count is starting to drop a little. I think that we should give him a dose of Neupogen so that we get him through the next week or so.  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  His platelets are on the low side. He is supposed to have a dental cleaning next week. I gave his mom a note to give to his dentist saying that a cleaning would be okay with a platelet count of 75,000.  I'm glad that we did a bone marrow test on him. At least we know that there is no underlying leukemia or myeloid malignancy that we have to deal with.  I want him back in another 10 days.  I spent about 35 minutes with them today.  Volanda Napoleon, MD 6/1/20163:58 PM

## 2014-07-03 NOTE — Telephone Encounter (Signed)
Noted, this appears to be the first seizure on Onfi that I can recall.  I agree with this plan.

## 2014-07-03 NOTE — Patient Instructions (Signed)

## 2014-07-04 ENCOUNTER — Encounter: Payer: Self-pay | Admitting: Hematology & Oncology

## 2014-07-08 ENCOUNTER — Telehealth: Payer: Self-pay | Admitting: Family

## 2014-07-08 ENCOUNTER — Telehealth: Payer: Self-pay | Admitting: *Deleted

## 2014-07-08 LAB — CHROMOSOME ANALYSIS, BONE MARROW

## 2014-07-08 NOTE — Telephone Encounter (Signed)
[name] wanted [name] to know that Jeffery [name] had a low grade temp all weekend. T max was 99.9. Currently 98.6 rectally. They are already scheduled to be seen this Friday. [name] states if [name] doesn't want to change plan of care, she doesn't need a call back. Spoke to [name] who does not want to change anything. Office will see patient as scheduled this Friday, June 10th.

## 2014-07-08 NOTE — Telephone Encounter (Signed)
I left a message that I would not make changes at this time.  I want to see the full results of the bone marrow.

## 2014-07-08 NOTE — Telephone Encounter (Signed)
[name] left message about Jeffery [name]. She said that he developed low grade fever on weekend. She contacted his PCP to let him know. She wanted to let Dr Gaynell Face know that he had 2 grand mal seizures last night, the first was at 1AM It was brief and Mom didn't have to intervene for it to stop. He had 02 on but not bipap yet. He had another one later in the night 45 seconds. For that one, he was asleep with bipap on. With the seizure he was hyperventilating with eyes jerking back and forth. She said that he has been doing ok today. Mom said that the bone marrow biopsy results have been normal thus far, but that stem cell results are still pending. Mom can be reached at [phone] if you have instructions. Otherwise, she said no need to call back. TG

## 2014-07-11 ENCOUNTER — Telehealth: Payer: Self-pay | Admitting: *Deleted

## 2014-07-11 NOTE — Telephone Encounter (Signed)
Patient's mother reports that patient has been feeling poorly this week. Low grade temps, congested cough, and increasing shortness of breath. She has been in contact with patient's PCP who has prescribed interventions including lasix, albuterol and a CXR. [name] wants to know if we can have labs drawn via home health today to determine whether or not patient needs to be seen in the office tomorrow. [name] is fine with this. Home Health contacted and they will draw his labs. Once labs are reviewed we will determine whether or not patient needs to come into the office. [name] agreeable to plan.

## 2014-07-12 ENCOUNTER — Encounter: Payer: Self-pay | Admitting: Hematology & Oncology

## 2014-07-12 ENCOUNTER — Ambulatory Visit (HOSPITAL_BASED_OUTPATIENT_CLINIC_OR_DEPARTMENT_OTHER): Payer: Medicaid Other

## 2014-07-12 ENCOUNTER — Other Ambulatory Visit: Payer: Medicaid Other

## 2014-07-12 ENCOUNTER — Telehealth: Payer: Self-pay | Admitting: *Deleted

## 2014-07-12 ENCOUNTER — Ambulatory Visit (HOSPITAL_BASED_OUTPATIENT_CLINIC_OR_DEPARTMENT_OTHER): Payer: Medicaid Other | Admitting: Hematology & Oncology

## 2014-07-12 VITALS — BP 116/55 | HR 133 | Temp 99.1°F | Resp 16

## 2014-07-12 DIAGNOSIS — K219 Gastro-esophageal reflux disease without esophagitis: Secondary | ICD-10-CM

## 2014-07-12 DIAGNOSIS — D72819 Decreased white blood cell count, unspecified: Secondary | ICD-10-CM

## 2014-07-12 MED ORDER — FILGRASTIM 480 MCG/0.8ML IJ SOSY
480.0000 ug | PREFILLED_SYRINGE | Freq: Once | INTRAMUSCULAR | Status: AC
  Administered 2014-07-12: 480 ug via SUBCUTANEOUS
  Filled 2014-07-12: qty 0.8

## 2014-07-12 MED ORDER — METOCLOPRAMIDE HCL 5 MG/5ML PO SOLN: 10.0000 mg | Freq: Three times a day (TID) | ORAL | Status: DC

## 2014-07-12 MED ORDER — PREDNISOLONE SODIUM PHOSPHATE 15 MG/5ML PO SOLN: ORAL | Status: DC

## 2014-07-12 NOTE — Progress Notes (Signed)
Hematology and Oncology Follow Up Visit  Jeffery [name] [id number] Sep 01, 1990 24 y.o. 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 is back for follow-up. He still is having some issues with low-grade temperatures. I really have to suspect that he might be aspirating. He has chest x-rays done at home. When I spoke to his mom on the phone,, she says that a chest x-ray recently did not show anything obvious. I suppose this would not surprise me.  His father says that when he lies down flat, he has a lot of issues with coughing.  He gets tube feeds. I think the tube feeds might be causing some issues. Hopefully, some Reglan might help.  Because of his overall health status and the degree of difficulty with getting him around, we are trying to do everything to keep him at home.   He's had no bleeding.  He's had no diarrhea.  As always, his parents are really dedicated to helping take care of him.  Medications:  Current outpatient prescriptions:  .  Acetaminophen (TYLENOL PO), Place 650 mg into feeding tube every 6 (six) hours as needed (fever)., Disp: , Rfl:  .  albuterol (PROVENTIL HFA;VENTOLIN HFA) 108 (90 BASE) MCG/ACT inhaler, Inhale 2 puffs into the lungs every 4 (four) hours as needed for shortness of breath., Disp: , Rfl:  .  albuterol (PROVENTIL) (2.5 MG/3ML) 0.083% nebulizer solution, Take 2.5 mg by nebulization 2 (two) times daily as needed. 1 ampule twice daily and as needed (making 4 doses daily). Take a 8am and 8pm and then prn 6 and 7 are not used at the same time, Disp: , Rfl:  .  Amino Acids-Protein Hydrolys (FEEDING SUPPLEMENT, PRO-STAT SUGAR FREE 64,) LIQD, Take 30 mLs by mouth 2 (two) times daily., Disp: 900 mL, Rfl: 0 .  Ascorbic Acid (VITAMIN C) 500 MG/5ML LIQD, 300 mg by PEG Tube route every  morning. , Disp: , Rfl:  .  baclofen (GABLOFEN) 40000 MCG/20ML SOLN, 385.2 mcg by Intrathecal route continuous. , Disp: , Rfl:  .  calcium carbonate, dosed in mg elemental calcium, 1250 MG/5ML, by PEG Tube route 3 (three) times daily. 5 cc via peg tube. Give at 8am , 2pm, and 8pm, Disp: , Rfl:  .  cloBAZam (ONFI) 2.5 MG/ML solution, Place 2.5 mL in gastrostomy tube 3 times daily, Disp: 240 mL, Rfl: 5 .  DIASTAT ACUDIAL 20 MG GEL, Place 12.5 mg rectally as needed. For seizure lasting 2 minutes or longer or repetitive seizures.  No more than 1 dose every 12 hours or nasal versed (not both)., Disp: , Rfl: 5 .  dicyclomine (BENTYL) 10 MG/5ML syrup, Place 10 mg into feeding tube every 8 (eight) hours as needed. Not to exceed 5 doses in 1 wk as needed for Abd cramps, Disp: , Rfl:  .  folic acid (FOLVITE) 1 MG tablet, 1 mg by Feeding Tube route daily. 8:00am per G tube, Disp: , Rfl:  .  furosemide (LASIX) 10 MG/ML solution, Place 20 mg into feeding tube daily as needed for fluid. , Disp: , Rfl:  .  gabapentin (NEURONTIN) 250 MG/5ML solution, Take 18 mL 3 times daily (Patient taking differently: Place 900 mg into feeding tube 3 (three) times daily. Give 59mls tid at 8am, 2pm and 8pm), Disp: 1620 mL, Rfl: 5 .  ibuprofen (ADVIL,MOTRIN) 100 MG/5ML suspension, Place 400 mg into feeding tube every 6 (six) hours as needed for fever (pain)., Disp: , Rfl:  .  ketoconazole (NIZORAL) 2 % cream, Apply 1 application topically 2 (two) times daily as needed for irritation (yeast rash from antibiotics). , Disp: , Rfl:  .  lansoprazole (PREVACID SOLUTAB) 30 MG disintegrating tablet, 30 mg by Feeding Tube route 2 (two) times daily. 7:30am and 7:30pm, Disp: , Rfl:  .  metoCLOPramide (REGLAN) 5 MG/5ML solution, Place 10 mLs (10 mg total) into feeding tube 4 (four) times daily -  before meals and at bedtime., Disp: 240 mL, Rfl: 0 .  midazolam (VERSED) 5 MG/ML injection, Place 2 mLs (10 mg total) into the nose once. Draw up 64ml in  2 syringes. Remove blue vial access device. Attach syringe to nasal atomizer for intranasal administration. Give 51ml in right nostril x 2 for seizures lasting 2 minutes or longer or for repetitive seizures in a short period of time., Disp: 6 mL, Rfl: 3 .  Multiple Vitamins-Minerals (MULTIVITAMIN) LIQD, 10 mLs by Feeding Tube route daily. , Disp: , Rfl:  .  Multiple Vitamins-Minerals (ZINC PO), 5 mLs by PEG Tube route daily. 244 mg / 5 mL zinc solution, Disp: , Rfl:  .  mupirocin ointment (BACTROBAN) 2 %, Apply 1 application topically as needed (to G-T site)., Disp: 22 g, Rfl: 1 .  Nutritional Supplements (PROMOD PO), 30 mLs by Feeding Tube route 2 (two) times daily. Give at 10am and 5pm per  g tube, Disp: , Rfl:  .  Nutritional Supplements (TWOCAL HN) LIQD, 237 mLs by Feeding Tube route. T.3 can at 46 cc hour x 12 hours - water 172 cc pre and post each and extra 172 bid., Disp: , Rfl:  .  OnabotulinumtoxinA (BOTOX IJ), Inject 300 Units as directed See admin instructions. Every 3 months, Disp: , Rfl:  .  OVER THE COUNTER MEDICATION, Apply 1 application topically as needed (after diaper changes). Balmex and Bag Balm paste made and applied after diaper changes, Disp: , Rfl:  .  OXYGEN-HELIUM IN, Inhale into the lungs. Oxygen PRN to keep O2 Sat at 90%, Disp: , Rfl:  .  potassium chloride (KLOR-CON) 20 MEQ packet, 20 mEq by Feeding Tube route daily. , Disp: , Rfl:  .  prednisoLONE (ORAPRED) 15 MG/5ML solution, Administer 10 mL twice a day for 3 days, then 10 mL daily for 3 days, then 5 mL daily for 3 days., Disp: 200 mL, Rfl: 3 .  Pseudoephedrine HCl (SUDAFED CHILDRENS) 15 MG/5ML LIQD, Give 90 mg by tube 3 (three) times daily. , Disp: , Rfl:  .  simethicone (MYLICON) 585 MG chewable tablet, 125 mg by Feeding Tube route every 6 (six) hours as needed for flatulence. , Disp: , Rfl:  .  sodium phosphate (FLEET) enema, Place 1 enema rectally daily as needed. follow package directions PRN. Help get pass gas, Disp:  , Rfl:  .  sucralfate (CARAFATE) 1 GM/10ML suspension, Place 1 g into feeding tube 4 (four) times daily. 7am, 130pm, 5pm, 8pm   Alone X 79min, Disp: , Rfl:  .  Water For Irrigation, Sterile (FREE WATER) SOLN, Place 172 mLs into feeding tube 4 (four) times daily., Disp: , Rfl:  .  zinc oxide (BALMEX) 11.3 % CREA cream, Apply 1 application topically 5 (five) times daily. With each diaper change., Disp: , Rfl:   Allergies:  Allergies  Allergen Reactions  . Vimpat [Lacosamide] Rash  . Adhesive [  Tape]     Rips skin off  . Depakote [Divalproex Sodium]     Causes pancreatitis   . Keppra [Levetiracetam] Other (See Comments)    Bone marrow suppression  . Amoxicillin-Pot Clavulanate Rash    Give w fluconazole then no rash  . Neulasta [Pegfilgrastim] Palpitations    Past Medical History, Surgical history, Social history, and Family History were reviewed and updated.  Review of Systems: As above  Physical Exam:  axillary temperature is 99.1 F (37.3 C). His blood pressure is 116/55 and his pulse is 133. His respiration is 16 and oxygen saturation is 92%.   Special needs young male. He has obvious changes consistent with cerebral palsy. He has no obvious oral lesions. He has no adenopathy. Lungs are clear. Cardiac exam tachycardic rate and regular rhythm with no murmurs, rubs or bruits. Abdomen is soft. He is slightly obese. He has no obvious hepatosplenomegaly. Extremities shows joint changes consistent  with his cerebral palsy. He does have an implantable baclofen pump in the right lower quadrant of his abdomen. Skin exam shows no obvious rashes. There is no ecchymoses or petechia. Neurological exam shows the spasticity and dysarthria from his cerebral palsy  Lab Results  Component Value Date   WBC 3.4* 07/03/2014   HGB 11.6* 07/03/2014   HCT 36.3* 07/03/2014   MCV 108* 07/03/2014   PLT 75* 07/03/2014     Chemistry      Component Value Date/Time   NA 140 07/03/2014 1301   NA 139  06/27/2014 0700   K 3.4 07/03/2014 1301   K 2.8* 06/27/2014 0700   CL 96* 07/03/2014 1301   CL 92* 06/27/2014 0700   CO2 32 07/03/2014 1301   CO2 35* 06/27/2014 0700   BUN 17 07/03/2014 1301   BUN <5* 06/27/2014 0700   CREATININE 0.7 07/03/2014 1301   CREATININE 0.57* 06/27/2014 0700      Component Value Date/Time   CALCIUM 9.5 07/03/2014 1301   CALCIUM 9.4 06/27/2014 0700   ALKPHOS 166* 07/03/2014 1301   ALKPHOS 117 06/27/2014 0700   AST 24 07/03/2014 1301   AST 37 06/27/2014 0700   ALT 36 07/03/2014 1301   ALT 51 06/27/2014 0700   BILITOT 1.00 07/03/2014 1301   BILITOT 0.6 06/27/2014 0700     Normochromic and normocytic red blood cells. I see no nucleated red cells. There is no teardrop cells. There is no rouleau formation. There is no inclusion bodies. White cells. Normal in morphology and maturation. There are no hypersegmented polys. There is no immature myeloid or lymphoid forms. Platelets are mildly decreased in number.    Impression and Plan: Mr. Frommelt is 24 year old male. His white cell count is trending downward. I think that he will need a shot of Neupogen. If not, I think that his white cell count will continue to drop and he may and up back in the hospital.  I think there might be some aspiration issues. As such, we will try him on some Reglan. I also will time on some prednisone. Both are in liquid form so that he can have these but through his feeding tube.  I called his mom on the phone today. She really is the person who is dealing with all of his health issues. His dad is a great job in bringing him to his appointments.  We will get home health to get some blood work on him next week. We'll try get this on Wednesday.   was I spent  about 35 minutes with them today.  Volanda Napoleon, MD 6/10/20163:59 PM

## 2014-07-12 NOTE — Patient Instructions (Signed)
Filgrastim, G-CSF injection  What is this medicine?  FILGRASTIM, G-CSF (fil GRA stim) is a granulocyte colony-stimulating factor that stimulates the growth of neutrophils, a type of white blood cell (WBC) important in the body's fight against infection. It is used to reduce the incidence of fever and infection in patients with certain types of cancer who are receiving chemotherapy that affects the bone marrow, to stimulate blood cell production for removal of WBCs from the body prior to a bone marrow transplantation, to reduce the incidence of fever and infection in patients who have severe chronic neutropenia, and to improve survival outcomes following high-dose radiation exposure that is toxic to the bone marrow.  This medicine may be used for other purposes; ask your health care provider or pharmacist if you have questions.  COMMON BRAND NAME(S): Neupogen  What should I tell my health care provider before I take this medicine?  They need to know if you have any of these conditions:  -latex allergy  -ongoing radiation therapy  -sickle cell disease  -an unusual or allergic reaction to filgrastim, pegfilgrastim, other medicines, foods, dyes, or preservatives  -pregnant or trying to get pregnant  -breast-feeding  How should I use this medicine?  This medicine is for injection under the skin. If you get this medicine at home, you will be taught how to prepare and give this medicine. Refer to the Instructions for Use that come with your medication packaging. Use exactly as directed. Take your medicine at regular intervals. Do not take your medicine more often than directed.  It is important that you put your used needles and syringes in a special sharps container. Do not put them in a trash can. If you do not have a sharps container, call your pharmacist or healthcare provider to get one.  Talk to your pediatrician regarding the use of this medicine in children. While this drug may be prescribed for children as young  as 7 months for selected conditions, precautions do apply.  Overdosage: If you think you have taken too much of this medicine contact a poison control center or emergency room at once.  NOTE: This medicine is only for you. Do not share this medicine with others.  What if I miss a dose?  It is important not to miss your dose. Call your doctor or health care professional if you miss a dose.  What may interact with this medicine?  This medicine may interact with the following medications:  -medicines that may cause a release of neutrophils, such as lithium  This list may not describe all possible interactions. Give your health care provider a list of all the medicines, herbs, non-prescription drugs, or dietary supplements you use. Also tell them if you smoke, drink alcohol, or use illegal drugs. Some items may interact with your medicine.  What should I watch for while using this medicine?  You may need blood work done while you are taking this medicine.  What side effects may I notice from receiving this medicine?  Side effects that you should report to your doctor or health care professional as soon as possible:  -allergic reactions like skin rash, itching or hives, swelling of the face, lips, or tongue  -dizziness or feeling faint  -fever  -pain, redness, or irritation at site where injected  -pinpoint red spots on the skin  -shortness of breath or breathing problems  -stomach or side pain, or pain at the shoulder  -swelling  -tiredness  -trouble passing urine  -unusual   bleeding or bruising  Side effects that usually do not require medical attention (report to your doctor or health care professional if they continue or are bothersome):  -bone pain  -headache  -muscle pain  This list may not describe all possible side effects. Call your doctor for medical advice about side effects. You may report side effects to FDA at 1-800-FDA-1088.  Where should I keep my medicine?  Keep out of the reach of children.  Store in a  refrigerator between 2 and 8 degrees C (36 and 46 degrees F). Do not freeze. Keep in carton to protect from light. Throw away this medicine if vials or syringes are left out of the refrigerator for more than 24 hours. Throw away any unused medicine after the expiration date.  NOTE: This sheet is a summary. It may not cover all possible information. If you have questions about this medicine, talk to your doctor, pharmacist, or health care provider.   2015, [employer]. (2013-05-04 17:00:01)

## 2014-07-12 NOTE — Telephone Encounter (Signed)
[name] reviewed labs drawn by home health and wants patient to come into the office today for a Neupogen shot as scheduled. Spoke to [place] and she states they had a rough night and will try to get him here. She will call back if there are any updates.

## 2014-07-13 ENCOUNTER — Emergency Department (HOSPITAL_COMMUNITY): Payer: Medicaid Other

## 2014-07-13 ENCOUNTER — Encounter (HOSPITAL_COMMUNITY): Payer: Self-pay | Admitting: *Deleted

## 2014-07-13 ENCOUNTER — Other Ambulatory Visit (HOSPITAL_COMMUNITY): Payer: Self-pay

## 2014-07-13 ENCOUNTER — Inpatient Hospital Stay (HOSPITAL_COMMUNITY)
Admission: EM | Admit: 2014-07-13 | Discharge: 2014-07-16 | DRG: 871 | Disposition: A | Discharge status: Home / Self Care | Payer: Medicaid Other | Attending: Internal Medicine | Admitting: Internal Medicine

## 2014-07-13 ENCOUNTER — Other Ambulatory Visit: Payer: Self-pay

## 2014-07-13 DIAGNOSIS — Z791 Long term (current) use of non-steroidal anti-inflammatories (NSAID): Secondary | ICD-10-CM

## 2014-07-13 DIAGNOSIS — Z993 Dependence on wheelchair: Secondary | ICD-10-CM | POA: Diagnosis not present

## 2014-07-13 DIAGNOSIS — R509 Fever, unspecified: Secondary | ICD-10-CM | POA: Diagnosis present

## 2014-07-13 DIAGNOSIS — G808 Other cerebral palsy: Secondary | ICD-10-CM | POA: Diagnosis not present

## 2014-07-13 DIAGNOSIS — Z931 Gastrostomy status: Secondary | ICD-10-CM | POA: Diagnosis not present

## 2014-07-13 DIAGNOSIS — J690 Pneumonitis due to inhalation of food and vomit: Secondary | ICD-10-CM | POA: Diagnosis present

## 2014-07-13 DIAGNOSIS — K219 Gastro-esophageal reflux disease without esophagitis: Secondary | ICD-10-CM | POA: Diagnosis present

## 2014-07-13 DIAGNOSIS — A419 Sepsis, unspecified organism: Secondary | ICD-10-CM

## 2014-07-13 DIAGNOSIS — Z888 Allergy status to other drugs, medicaments and biological substances status: Secondary | ICD-10-CM

## 2014-07-13 DIAGNOSIS — G40309 Generalized idiopathic epilepsy and epileptic syndromes, not intractable, without status epilepticus: Secondary | ICD-10-CM | POA: Diagnosis present

## 2014-07-13 DIAGNOSIS — R1310 Dysphagia, unspecified: Secondary | ICD-10-CM | POA: Diagnosis present

## 2014-07-13 DIAGNOSIS — R7989 Other specified abnormal findings of blood chemistry: Secondary | ICD-10-CM | POA: Diagnosis present

## 2014-07-13 DIAGNOSIS — Z8679 Personal history of other diseases of the circulatory system: Secondary | ICD-10-CM

## 2014-07-13 DIAGNOSIS — D61818 Other pancytopenia: Secondary | ICD-10-CM | POA: Diagnosis present

## 2014-07-13 DIAGNOSIS — G4733 Obstructive sleep apnea (adult) (pediatric): Secondary | ICD-10-CM | POA: Diagnosis present

## 2014-07-13 DIAGNOSIS — G40219 Localization-related (focal) (partial) symptomatic epilepsy and epileptic syndromes with complex partial seizures, intractable, without status epilepticus: Secondary | ICD-10-CM | POA: Diagnosis present

## 2014-07-13 DIAGNOSIS — J9621 Acute and chronic respiratory failure with hypoxia: Secondary | ICD-10-CM | POA: Diagnosis present

## 2014-07-13 DIAGNOSIS — D638 Anemia in other chronic diseases classified elsewhere: Secondary | ICD-10-CM | POA: Diagnosis present

## 2014-07-13 DIAGNOSIS — D4620 Refractory anemia with excess of blasts, unspecified: Secondary | ICD-10-CM

## 2014-07-13 DIAGNOSIS — R739 Hyperglycemia, unspecified: Secondary | ICD-10-CM | POA: Diagnosis present

## 2014-07-13 DIAGNOSIS — Z66 Do not resuscitate: Secondary | ICD-10-CM | POA: Diagnosis present

## 2014-07-13 DIAGNOSIS — Z91048 Other nonmedicinal substance allergy status: Secondary | ICD-10-CM | POA: Diagnosis not present

## 2014-07-13 DIAGNOSIS — G800 Spastic quadriplegic cerebral palsy: Secondary | ICD-10-CM | POA: Diagnosis present

## 2014-07-13 DIAGNOSIS — T380X5A Adverse effect of glucocorticoids and synthetic analogues, initial encounter: Secondary | ICD-10-CM | POA: Diagnosis present

## 2014-07-13 DIAGNOSIS — J189 Pneumonia, unspecified organism: Secondary | ICD-10-CM

## 2014-07-13 DIAGNOSIS — J342 Deviated nasal septum: Secondary | ICD-10-CM | POA: Diagnosis present

## 2014-07-13 DIAGNOSIS — J9620 Acute and chronic respiratory failure, unspecified whether with hypoxia or hypercapnia: Secondary | ICD-10-CM | POA: Diagnosis not present

## 2014-07-13 DIAGNOSIS — Z6835 Body mass index (BMI) 35.0-35.9, adult: Secondary | ICD-10-CM

## 2014-07-13 DIAGNOSIS — M810 Age-related osteoporosis without current pathological fracture: Secondary | ICD-10-CM | POA: Diagnosis present

## 2014-07-13 DIAGNOSIS — Z880 Allergy status to penicillin: Secondary | ICD-10-CM

## 2014-07-13 DIAGNOSIS — T50905A Adverse effect of unspecified drugs, medicaments and biological substances, initial encounter: Secondary | ICD-10-CM | POA: Diagnosis present

## 2014-07-13 DIAGNOSIS — D619 Aplastic anemia, unspecified: Secondary | ICD-10-CM | POA: Diagnosis not present

## 2014-07-13 DIAGNOSIS — Z87898 Personal history of other specified conditions: Secondary | ICD-10-CM

## 2014-07-13 DIAGNOSIS — Z8673 Personal history of transient ischemic attack (TIA), and cerebral infarction without residual deficits: Secondary | ICD-10-CM

## 2014-07-13 DIAGNOSIS — D649 Anemia, unspecified: Secondary | ICD-10-CM | POA: Diagnosis not present

## 2014-07-13 DIAGNOSIS — R6521 Severe sepsis with septic shock: Secondary | ICD-10-CM | POA: Diagnosis present

## 2014-07-13 DIAGNOSIS — D696 Thrombocytopenia, unspecified: Secondary | ICD-10-CM | POA: Diagnosis not present

## 2014-07-13 DIAGNOSIS — Z79899 Other long term (current) drug therapy: Secondary | ICD-10-CM

## 2014-07-13 LAB — COMPREHENSIVE METABOLIC PANEL
ALK PHOS: 162 U/L — AB (ref 38–126)
ALT: 84 U/L — ABNORMAL HIGH (ref 17–63)
AST: 54 U/L — AB (ref 15–41)
Albumin: 3.6 g/dL (ref 3.5–5.0)
Anion gap: 14 (ref 5–15)
BILIRUBIN TOTAL: 0.7 mg/dL (ref 0.3–1.2)
BUN: 28 mg/dL — AB (ref 6–20)
CALCIUM: 9.6 mg/dL (ref 8.9–10.3)
CO2: 29 mmol/L (ref 22–32)
CREATININE: 0.8 mg/dL (ref 0.61–1.24)
Chloride: 101 mmol/L (ref 101–111)
GFR calc Af Amer: >60 mL/min (ref >60)
GFR calc non Af Amer: >60 mL/min (ref >60)
GLUCOSE: 122 mg/dL — AB (ref 65–99)
Potassium: 3.9 mmol/L (ref 3.5–5.1)
Sodium: 144 mmol/L (ref 135–145)
Total Protein: 7 g/dL (ref 6.5–8.1)

## 2014-07-13 LAB — URINALYSIS, ROUTINE W REFLEX MICROSCOPIC
BILIRUBIN URINE: NEGATIVE
Glucose, UA: NEGATIVE mg/dL
Hgb urine dipstick: NEGATIVE
KETONES UR: NEGATIVE mg/dL
LEUKOCYTES UA: NEGATIVE
Nitrite: NEGATIVE
Protein, ur: >300 mg/dL — AB
Specific Gravity, Urine: 1.017 (ref 1.005–1.030)
Urobilinogen, UA: 0.2 mg/dL (ref 0.0–1.0)
pH: 6 (ref 5.0–8.0)

## 2014-07-13 LAB — PROCALCITONIN: Procalcitonin: 0.53 ng/mL

## 2014-07-13 LAB — I-STAT VENOUS BLOOD GAS, ED
Acid-Base Excess: 4 mmol/L — ABNORMAL HIGH (ref 0.0–2.0)
Bicarbonate: 30.4 mEq/L — ABNORMAL HIGH (ref 20.0–24.0)
O2 Saturation: 73 %
PCO2 VEN: 52.2 mmHg — AB (ref 45.0–50.0)
PH VEN: 7.373 — AB (ref 7.250–7.300)
TCO2: 32 mmol/L (ref 0–100)
pO2, Ven: 40 mmHg (ref 30.0–45.0)

## 2014-07-13 LAB — I-STAT TROPONIN, ED: TROPONIN I, POC: 0 ng/mL (ref 0.00–0.08)

## 2014-07-13 LAB — GLUCOSE, CAPILLARY
Glucose-Capillary: 97 mg/dL (ref 65–99)
Glucose-Capillary: 86 mg/dL (ref 65–99)

## 2014-07-13 LAB — URINE MICROSCOPIC-ADD ON

## 2014-07-13 LAB — CBG MONITORING, ED
GLUCOSE-CAPILLARY: 101 mg/dL — AB (ref 65–99)
GLUCOSE-CAPILLARY: 110 mg/dL — AB (ref 65–99)

## 2014-07-13 LAB — LACTIC ACID, PLASMA
LACTIC ACID, VENOUS: 3.1 mmol/L — AB (ref 0.5–2.0)
Lactic Acid, Venous: 3.5 mmol/L (ref 0.5–2.0)

## 2014-07-13 LAB — CBC WITH DIFFERENTIAL/PLATELET
Basophils Absolute: 0 10*3/uL (ref 0.0–0.1)
Basophils Relative: 0 % (ref 0–1)
EOS ABS: 0 10*3/uL (ref 0.0–0.7)
Eosinophils Relative: 0 % (ref 0–5)
HCT: 32.1 % — ABNORMAL LOW (ref 39.0–52.0)
Hemoglobin: 10 g/dL — ABNORMAL LOW (ref 13.0–17.0)
LYMPHS PCT: 2 % — AB (ref 12–46)
Lymphs Abs: 0.3 10*3/uL — ABNORMAL LOW (ref 0.7–4.0)
MCH: 32.3 pg (ref 26.0–34.0)
MCHC: 31.2 g/dL (ref 30.0–36.0)
MCV: 103.5 fL — ABNORMAL HIGH (ref 78.0–100.0)
MONO ABS: 0.6 10*3/uL (ref 0.1–1.0)
Monocytes Relative: 4 % (ref 3–12)
NEUTROS PCT: 94 % — AB (ref 43–77)
Neutro Abs: 14.8 10*3/uL — ABNORMAL HIGH (ref 1.7–7.7)
PLATELETS: 168 10*3/uL (ref 150–400)
RBC: 3.1 MIL/uL — AB (ref 4.22–5.81)
RDW: 18.3 % — ABNORMAL HIGH (ref 11.5–15.5)
WBC: 15.7 10*3/uL — ABNORMAL HIGH (ref 4.0–10.5)

## 2014-07-13 LAB — MRSA PCR SCREENING: MRSA BY PCR: NEGATIVE

## 2014-07-13 LAB — I-STAT CG4 LACTIC ACID, ED
LACTIC ACID, VENOUS: 3.14 mmol/L — AB (ref 0.5–2.0)
Lactic Acid, Venous: 4.1 mmol/L (ref 0.5–2.0)

## 2014-07-13 MED ORDER — PSEUDOEPHEDRINE HCL 15 MG/5ML PO LIQD
90.0000 mg | ORAL | Status: DC
  Filled 2014-07-13 (×3): qty 30

## 2014-07-13 MED ORDER — ACETAMINOPHEN 650 MG RE SUPP: 650.0000 mg | Freq: Once | RECTAL | Status: DC

## 2014-07-13 MED ORDER — PSEUDOEPHEDRINE HCL 30 MG/5ML PO SYRP
30.0000 mg | ORAL_SOLUTION | ORAL | Status: DC
  Filled 2014-07-13 (×3): qty 5

## 2014-07-13 MED ORDER — SODIUM CHLORIDE 0.9 % IV BOLUS (SEPSIS)
1000.0000 mL | INTRAVENOUS | Status: AC
  Administered 2014-07-13 (×2): 1000 mL via INTRAVENOUS

## 2014-07-13 MED ORDER — INSULIN ASPART 100 UNIT/ML ~~LOC~~ SOLN: 0.0000 [IU] | SUBCUTANEOUS | Status: DC

## 2014-07-13 MED ORDER — LEVALBUTEROL HCL 0.63 MG/3ML IN NEBU
0.6300 mg | INHALATION_SOLUTION | RESPIRATORY_TRACT | Status: DC | PRN
  Administered 2014-07-13 – 2014-07-14 (×2): 0.63 mg via RESPIRATORY_TRACT
  Filled 2014-07-13 (×2): qty 3

## 2014-07-13 MED ORDER — CLOBAZAM 2.5 MG/ML PO SUSP
2.5000 mg | ORAL | Status: DC
  Administered 2014-07-13 – 2014-07-16 (×9): 2.5 mg
  Filled 2014-07-13 (×10): qty 4

## 2014-07-13 MED ORDER — SODIUM CHLORIDE 0.9 % IJ SOLN: 3.0000 mL | INTRAMUSCULAR | Status: DC | PRN

## 2014-07-13 MED ORDER — LABETALOL HCL 100 MG PO TABS
100.0000 mg | ORAL_TABLET | Freq: Two times a day (BID) | ORAL | Status: DC
  Administered 2014-07-13: 100 mg via ORAL
  Filled 2014-07-13 (×3): qty 1

## 2014-07-13 MED ORDER — DICYCLOMINE HCL 10 MG/5ML PO SOLN
10.0000 mg | Freq: Three times a day (TID) | ORAL | Status: DC | PRN
Start: 2014-07-13 — End: 2014-07-14
  Filled 2014-07-13: qty 5

## 2014-07-13 MED ORDER — SUCRALFATE 1 GM/10ML PO SUSP
1.0000 g | ORAL | Status: DC
  Administered 2014-07-13 – 2014-07-16 (×11): 1 g
  Filled 2014-07-13 (×18): qty 10

## 2014-07-13 MED ORDER — FOLIC ACID 1 MG PO TABS
1.0000 mg | ORAL_TABLET | Freq: Every day | ORAL | Status: DC
  Administered 2014-07-14 – 2014-07-16 (×2): 1 mg
  Filled 2014-07-13 (×3): qty 1

## 2014-07-13 MED ORDER — ALBUTEROL SULFATE (2.5 MG/3ML) 0.083% IN NEBU
2.5000 mg | INHALATION_SOLUTION | Freq: Two times a day (BID) | RESPIRATORY_TRACT | Status: DC | PRN
  Administered 2014-07-13 – 2014-07-16 (×4): 2.5 mg via RESPIRATORY_TRACT
  Filled 2014-07-13 (×4): qty 3

## 2014-07-13 MED ORDER — KETOCONAZOLE 2 % EX CREA: 1.0000 "application " | TOPICAL_CREAM | Freq: Two times a day (BID) | CUTANEOUS | Status: DC | PRN

## 2014-07-13 MED ORDER — PRO-STAT SUGAR FREE PO LIQD
30.0000 mL | Freq: Two times a day (BID) | ORAL | Status: DC
  Administered 2014-07-13 – 2014-07-14 (×3): 30 mL via ORAL
  Filled 2014-07-13 (×6): qty 30

## 2014-07-13 MED ORDER — PREDNISOLONE 15 MG/5ML PO SOLN
30.0000 mg | Freq: Two times a day (BID) | ORAL | Status: AC
  Administered 2014-07-14 – 2014-07-15 (×4): 30 mg
  Filled 2014-07-13 (×5): qty 10

## 2014-07-13 MED ORDER — VITAMIN C 500 MG/5ML PO LIQD: 300.0000 mg | Freq: Every morning | ORAL | Status: DC

## 2014-07-13 MED ORDER — ACETAMINOPHEN 160 MG/5ML PO SOLN
650.0000 mg | Freq: Once | ORAL | Status: AC
  Administered 2014-07-13: 650 mg
  Filled 2014-07-13: qty 20.3

## 2014-07-13 MED ORDER — ACETAMINOPHEN 160 MG/5ML PO SOLN: 650.0000 mg | ORAL | Status: DC | PRN

## 2014-07-13 MED ORDER — METOCLOPRAMIDE HCL 5 MG/5ML PO SOLN
10.0000 mg | Freq: Three times a day (TID) | ORAL | Status: DC
  Administered 2014-07-13 – 2014-07-16 (×10): 10 mg
  Filled 2014-07-13 (×15): qty 10

## 2014-07-13 MED ORDER — VANCOMYCIN HCL IN DEXTROSE 750-5 MG/150ML-% IV SOLN
750.0000 mg | Freq: Three times a day (TID) | INTRAVENOUS | Status: DC
  Administered 2014-07-13 – 2014-07-15 (×6): 750 mg via INTRAVENOUS
  Filled 2014-07-13 (×9): qty 150

## 2014-07-13 MED ORDER — PIPERACILLIN-TAZOBACTAM 3.375 G IVPB
3.3750 g | Freq: Once | INTRAVENOUS | Status: AC
  Administered 2014-07-13: 3.375 g via INTRAVENOUS
  Filled 2014-07-13: qty 50

## 2014-07-13 MED ORDER — GABAPENTIN 250 MG/5ML PO SOLN: 900.0000 mg | Freq: Three times a day (TID) | ORAL | Status: DC

## 2014-07-13 MED ORDER — POTASSIUM CHLORIDE 20 MEQ/15ML (10%) PO SOLN
20.0000 meq | ORAL | Status: DC
  Administered 2014-07-14: 20 meq
  Filled 2014-07-13: qty 15

## 2014-07-13 MED ORDER — VANCOMYCIN HCL IN DEXTROSE 1-5 GM/200ML-% IV SOLN
1000.0000 mg | Freq: Once | INTRAVENOUS | Status: AC
  Administered 2014-07-13: 1000 mg via INTRAVENOUS
  Filled 2014-07-13: qty 200

## 2014-07-13 MED ORDER — DIPHENOXYLATE-ATROPINE 2.5-0.025 MG/5ML PO LIQD: 10.0000 mL | Freq: Four times a day (QID) | ORAL | Status: DC | PRN

## 2014-07-13 MED ORDER — VITAMIN C 500 MG/5ML PO SYRP
300.0000 mg | ORAL_SOLUTION | ORAL | Status: DC
  Administered 2014-07-14 – 2014-07-16 (×2): 300 mg
  Filled 2014-07-13 (×4): qty 3

## 2014-07-13 MED ORDER — PREDNISOLONE 15 MG/5ML PO SOLN
30.0000 mg | Freq: Every day | ORAL | Status: DC
  Administered 2014-07-16: 30 mg via ORAL
  Filled 2014-07-13 (×3): qty 10

## 2014-07-13 MED ORDER — DICYCLOMINE HCL 10 MG/5ML PO SOLN: 10.0000 mg | Freq: Three times a day (TID) | ORAL | Status: DC

## 2014-07-13 MED ORDER — PSEUDOEPHEDRINE HCL 30 MG/5ML PO SYRP
90.0000 mg | ORAL_SOLUTION | ORAL | Status: DC
  Administered 2014-07-13 – 2014-07-16 (×7): 90 mg
  Filled 2014-07-13 (×12): qty 15

## 2014-07-13 MED ORDER — LANSOPRAZOLE 30 MG PO TBDP
30.0000 mg | ORAL_TABLET | ORAL | Status: DC
  Administered 2014-07-13 – 2014-07-16 (×7): 30 mg via JEJUNOSTOMY
  Filled 2014-07-13 (×10): qty 1

## 2014-07-13 MED ORDER — MIDAZOLAM 5 MG/ML PEDIATRIC INJ FOR INTRANASAL/SUBLINGUAL USE: 10.0000 mg | Freq: Once | INTRAMUSCULAR | Status: DC

## 2014-07-13 MED ORDER — ZINC OXIDE 11.3 % EX CREA
1.0000 "application " | TOPICAL_CREAM | Freq: Every day | CUTANEOUS | Status: DC
  Administered 2014-07-13 – 2014-07-16 (×15): 1 via TOPICAL
  Filled 2014-07-13: qty 56

## 2014-07-13 MED ORDER — IPRATROPIUM BROMIDE 0.02 % IN SOLN
0.5000 mg | RESPIRATORY_TRACT | Status: DC | PRN
  Administered 2014-07-13 – 2014-07-14 (×3): 0.5 mg via RESPIRATORY_TRACT
  Filled 2014-07-13 (×3): qty 2.5

## 2014-07-13 MED ORDER — HEPARIN SOD (PORK) LOCK FLUSH 100 UNIT/ML IV SOLN
250.0000 [IU] | INTRAVENOUS | Status: DC | PRN
  Filled 2014-07-13: qty 3

## 2014-07-13 MED ORDER — PREDNISOLONE 15 MG/5ML PO SOLN: 5.0000 mg | Freq: Every day | ORAL | Status: DC

## 2014-07-13 MED ORDER — SODIUM CHLORIDE 0.9 % IJ SOLN
3.0000 mL | Freq: Two times a day (BID) | INTRAMUSCULAR | Status: DC
  Administered 2014-07-14 – 2014-07-16 (×2): 3 mL via INTRAVENOUS

## 2014-07-13 MED ORDER — POTASSIUM CHLORIDE 20 MEQ PO PACK: 20.0000 meq | PACK | Freq: Every day | ORAL | Status: DC

## 2014-07-13 MED ORDER — GABAPENTIN 250 MG/5ML PO SOLN
900.0000 mg | ORAL | Status: DC
  Administered 2014-07-13 – 2014-07-16 (×8): 900 mg
  Filled 2014-07-13 (×13): qty 18

## 2014-07-13 MED ORDER — PIPERACILLIN-TAZOBACTAM 3.375 G IVPB
3.3750 g | Freq: Three times a day (TID) | INTRAVENOUS | Status: DC
  Administered 2014-07-13 – 2014-07-16 (×9): 3.375 g via INTRAVENOUS
  Filled 2014-07-13 (×11): qty 50

## 2014-07-13 MED ORDER — PIPERACILLIN-TAZOBACTAM 3.375 G IVPB: 3.3750 g | Freq: Three times a day (TID) | INTRAVENOUS | Status: DC

## 2014-07-13 MED ORDER — PREDNISOLONE 15 MG/5ML PO SOLN
30.0000 mg | Freq: Once | ORAL | Status: DC
  Filled 2014-07-13: qty 10

## 2014-07-13 MED ORDER — DEXTROSE IN LACTATED RINGERS 5 % IV SOLN
INTRAVENOUS | Status: DC
  Administered 2014-07-13 – 2014-07-15 (×5): via INTRAVENOUS

## 2014-07-13 NOTE — Progress Notes (Signed)
Antibiotic CONSULT NOTE - Initial Consult  Pharmacy Consult for Vanco/Zosyn Indication: PNA, Sepsis  Allergies  Allergen Reactions  . Depakote [Divalproex Sodium]     Causes pancreatitis   . Vimpat [Lacosamide] Rash  . Keppra [Levetiracetam] Other (See Comments)    Bone marrow suppression  . Adhesive [Tape]     Rips skin off  . Amoxicillin-Pot Clavulanate Rash    Give w fluconazole then no rash  . Neulasta [Pegfilgrastim] Palpitations    Patient Measurements:   Heparin Dosing Weight:    Vital Signs: Temp: 103.4 F (39.7 C) (06/11 0610) Temp Source: Rectal (06/11 0610) BP: 108/80 mmHg (06/11 1030) Pulse Rate: 130 (06/11 1030)  Labs:  Recent Labs  07/13/14 0625  HGB 10.0*  HCT 32.1*  PLT 168  CREATININE 0.80    CrCl cannot be calculated (Unknown ideal weight.).   Medical History: Past Medical History  Diagnosis Date  . CP (cerebral palsy), spastic, quadriplegic   . Osteoporosis   . Undescended testes   . Seasonal allergies   . IVH (intraventricular hemorrhage) 1991/01/18    Grade IV  . Hip dislocation, bilateral   . Dysphagia   . Retinopathy of prematurity   . 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 Neuromuscular scoliosis   . Osteoporosis   . Complex partial seizures   . Generalized convulsive epilepsy without mention of intractable epilepsy   . Sinus bradycardia     HR drops to 38-40 while sleeping  . Blister of right heel     fluid filled; origin unknown  . Kidney stones     ?  Marland Kitchen Pneumonia      chronic pneumonia ,respitory failure dx Augest 2014  . Aspiration pneumonia     "chronic" (04/12/2014)  . OSA treated with BiPAP     "since age 41"   . Anemia   . 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 GERD (gastroesophageal reflux disease)   . Epilepsy   . Spastic quadriplegia   . Neutropenia 07/03/2014    Medications:  F/u med rec  Assessment: 24 y/o quadriplegic male with  tremendous PMH here with respiratory distress,   ID: Sepsis/PNA. LA elevated 4.1>3.1, WBC 15.7. Scr 0.8  GI/Nutrition: Dysphagia requring chronic J tube feeds, Dicyclomine, FA, Prevacid, Reglan, K+, sucralfate, Vit C  Endo: Drug-induced hyperglycemia  Neuro: prematurity with neonatal intraventricular hemorrhage/Cerebral palsy, quadriplegic, epilepsy. Meds: Onfi, (PTA intranasal versed and Valium to use for breakthrough seizures), baclofen pump, Clobazam, Neurontin,   Resp: Acute on chronic respiratory failure, chronic aspiration? Scheduled Sudafed.  Heme: chronic anemia, pancytopenia. Neupogen PTA   Goal of Therapy:  Vanco trough 15-20  Plan:  Vancomycin 750mg  IV q8hr based on past dosing. Zosyn 3.375g IV q8hr.  Kellan Raffield S. Alford Highland, PharmD, BCPS Clinical Staff Pharmacist Pager [place], [place] 07/13/2014,11:02 AM

## 2014-07-13 NOTE — Progress Notes (Signed)
ANTIBIOTIC CONSULT NOTE - INITIAL  Pharmacy Consult for Zosyn  Indication: rule out sepsis  Allergies  Allergen Reactions  . Vimpat [Lacosamide] Rash  . Adhesive [Tape]     Rips skin off  . Depakote [Divalproex Sodium]     Causes pancreatitis   . Keppra [Levetiracetam] Other (See Comments)    Bone marrow suppression  . Amoxicillin-Pot Clavulanate Rash    Give w fluconazole then no rash  . Neulasta [Pegfilgrastim] Palpitations    Patient Measurements: ~62 kg  Vital Signs: Temp: 103.4 F (39.7 C) (06/11 0610) Temp Source: Rectal (06/11 0610) BP: 131/87 mmHg (06/11 0645) Pulse Rate: 150 (06/11 0645)  Labs:Pending  Medical History: Past Medical History  Diagnosis Date  . CP (cerebral palsy), spastic, quadriplegic   . Osteoporosis   . Undescended testes   . Seasonal allergies   . IVH (intraventricular hemorrhage) 12/16/90    Grade IV  . Hip dislocation, bilateral   . Dysphagia   . Retinopathy of prematurity   . 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 Neuromuscular scoliosis   . Osteoporosis   . Complex partial seizures   . Generalized convulsive epilepsy without mention of intractable epilepsy   . Sinus bradycardia     HR drops to 38-40 while sleeping  . Blister of right heel     fluid filled; origin unknown  . Kidney stones     ?  Marland Kitchen Pneumonia      chronic pneumonia ,respitory failure dx Augest 2014  . Aspiration pneumonia     "chronic" (04/12/2014)  . OSA treated with BiPAP     "since age 33"   . Anemia   . 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 GERD (gastroesophageal reflux disease)   . Epilepsy   . Spastic quadriplegia   . Neutropenia 07/03/2014    Assessment: 24 y/o quadriplegic male here with respiratory distress, labs pending   Plan:  -Zosyn 3.375g x 1, f/u labs to ensure appropriate dosing -F/U vancomycin addition if warranted  Jeffery [name] 07/13/2014,7:02 AM

## 2014-07-13 NOTE — Progress Notes (Signed)
UR COMPLETED  

## 2014-07-13 NOTE — Progress Notes (Signed)
    Neurologic me.  Rt will return as needed to assist with bipap.Pt not on bipap at this tiRt note:

## 2014-07-13 NOTE — ED Notes (Signed)
Called flow manager to get an update on bed status. Beds unavailable. Updated family

## 2014-07-13 NOTE — ED Notes (Signed)
Pharmacy notified to send meds down to unit at family's request.

## 2014-07-13 NOTE — ED Notes (Signed)
Admitting NP at bedside

## 2014-07-13 NOTE — Progress Notes (Signed)
RT Note: Pt not on bipap at this time.  Rt will return to assist with bipap as needed.

## 2014-07-13 NOTE — H&P (Signed)
Patient was seen, examined, treatment plan was discussed with the Physician extender. I have directly reviewed the clinical findings, lab, imaging studies and management of this patient in detail. I have made the necessary changes to the above noted documentation, and agree with the documentation, as recorded by the Physician extender.  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 Marin Olp for pancytopenia and his WBC count was down so pt was given Neupogen and prednisone. He was brought to ED with concern for respiratory distress per patient's mother report. Pt is not good historian due to history of CP  In ED, BP was 89/46, T max 103.4 F, RR 16-41, HR 133 - 161, oxygen saturation 92% on [place] oxygen support. Blood work was notable for WBC count of 15.7, hemoglobin 10, lactic acid 4.1. CXR showed right lower lobe discoid atelectasis. He was started on broad spectrum abx due to concern for sepsis due to aspiration pneumonia.    Assessment & Plan  Principal Problem: Sepsis due to pneumonia / Leukocytosis / Aspiration pneumonia, unspecified organism /Acute respiratory failure with hypoxia  - Sepsis criteria met on admission with hypotension, tachycardia, tachypnea, fever, leukocytosis, lactic acidosis. Source of infection thought to be due to aspiration pneumonia - Sepsis protocol initiated; pneumonia order set placed  - Follow up lactic acid trend, procalcitonin - Follow up blood culture results, respiratory virus panel, strep pneumonia, legionella, HIV - Started vanco and zosyn - Continue IV fluids  - Admit to [facility] [phone]  *For further details on A/P please refer to the note by physician extender done below.      [facility] History and Physical                                                                                    Jeffery [name], is a 24 y.o. male  MRN: [id number]   DOB - [birth year]  Admit Date - 07/13/2014   Start time: 806 am  Finish time: 1020 am  Total time spent: 2 hrs 14 minutes  Outpatient Primary MD for the patient is [name], Monia Sabal, MD  Referring MD: Kathrynn Humble / ER  With History of -  Past Medical History  Diagnosis Date  . CP (cerebral palsy), spastic, quadriplegic   . Osteoporosis   . Undescended testes   . Seasonal allergies   . IVH (intraventricular hemorrhage) 12-Apr-1990    Grade IV  . Hip dislocation, bilateral   . Dysphagia   . Retinopathy of prematurity   . 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 Neuromuscular scoliosis   . Osteoporosis   . Complex partial seizures   . Generalized convulsive epilepsy without mention of intractable epilepsy   . Sinus bradycardia     HR drops to 38-40 while sleeping  . Blister of right heel     fluid filled; origin unknown  . Kidney stones     ?  Marland Kitchen Pneumonia      chronic pneumonia ,  respitory failure dx Augest 2014  . Aspiration pneumonia     "chronic" (04/12/2014)  . OSA treated with BiPAP     "since age 1"   . Anemia   . 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 GERD (gastroesophageal reflux disease)   . Epilepsy   . Spastic quadriplegia   . Neutropenia 07/03/2014      Past Surgical History  Procedure Laterality Date  . Mole removal  "several B/T 2008-2010"    "from all over"  . Jejunostomy feeding tube  03/08/2013; ~ 09/2013; ~ 01/2014    "transgastric-jujunal feeding tube"  . Gastrostomy tube placement  11./02/1998       . Button change  12/15/2010    Procedure: BUTTON CHANGE;  Surgeon: Gatha Mayer, MD;  Location: [facility];  Service: Endoscopy;  Laterality: N/A;  . Peg placement  10/07/2011    Procedure: PERCUTANEOUS ENDOSCOPIC GASTROSTOMY  (PEG) REPLACEMENT;  Surgeon: Lafayette Dragon, MD;  Location: [facility];  Service: Endoscopy;  Laterality: N/A;  Needs 18 F 2.5 button ordered-dl  . Inguinal hernia repair Bilateral 1992  . Retinopathy of prematurity surgery  1992  . Achilles tendon lengthening Bilateral 12/1998  . Tendon release  12/1998    Soft tissue releases  wrists and fingers [Other]  . Infusion pump implantation  07/25/2000; 2013    Intrathecal baclofen pump  . Peg placement N/A 06/05/2012    Procedure: PERCUTANEOUS ENDOSCOPIC GASTROSTOMY (PEG) REPLACEMENT;  Surgeon: Lafayette Dragon, MD;  Location: [facility];  Service: Endoscopy;  Laterality: N/A;  button 68f.2.5cm  . Strabismus surgery Bilateral 1993    "3 on right; 2 on left"  . Back surgery  ~ 2008    Harrington Rods in back needs to be log rolled  . Tendon repair Bilateral 09/05/2012    Procedure: LENGTHENING OF DIGITAL FLEXOR TENDONS BILTERAL HANDS;  Surgeon: DJolyn Nap MD;  [facility]  Service: Orthopedics;  Laterality: Bilateral;  . Peg placement N/A 09/13/2012    Procedure: PERCUTANEOUS ENDOSCOPIC GASTROSTOMY (PEG) REPLACEMENT;  Surgeon: DLafayette Dragon MD;  Location: [facility];  Service: Endoscopy;  Laterality: N/A;  . Flexible sigmoidoscopy N/A 10/30/2012    Procedure: FLEXIBLE SIGMOIDOSCOPY;  Surgeon: RCleotis Nipper MD;  Location: [facility];  Service: Endoscopy;  Laterality: N/A;  . Peg placement N/A 11/15/2012    Procedure: PERCUTANEOUS ENDOSCOPIC GASTROSTOMY (PEG) REPLACEMENT;  Surgeon: MJeryl Columbia MD;  [facility];  Service: Endoscopy;  Laterality: N/A;  . Gastrostomy tube placement  11./02/1998  . Rectal biopsy  10/29/2012    S/P diarrhea from Vancomycin  . Portacath placement Right 11/25/2012    chest  . Eye surgery      in for   Chief Complaint  Patient presents with  . Respiratory Distress     HPI This is a [age]year old male patient with history of prematurity related intraventricular hemorrhage with resultant  congenital spastic quadriplegia and intractable seizure disorder on a baclofen pump. He is well known to [facility] service and was last discharged on 5/26 after treatment for sepsis due to unspecified organism. During that hospitalization patient had pancytopenia and was evaluated by oncology/Dr. EMarin Olpwho felt patient may be experiencing a neutropenic fever and drug-induced thrombocytopenia related to [place] He was given Neupogen during that admission and a bone marrow biopsy was completed with pathology results available after discharge. This showed hypercellular marrow with normal hematopoiesis with  no increase in blasts. Features were not diagnostic of bone marrow disorder. It was felt the patient's pancytopenia was secondary to [place] which had been previously discontinued in favor of ONFI. Patient was last evaluated by Dr.Ennever on 6/10 for follow-up regarding his pancytopenia. His white cell count had trended downward so he was started on Neupogen and prednisone. At home the mother reported the patient was having low-grade temperatures and the oncologist suspected patient may be aspirating but mom said recent chest x-ray was unremarkable. Father reported that when the patient would lay flat he had persistent issues with coughing, this has prompted the family to hold tube feedings for periods of time to minimize aspiration risk and mom has noted that patient is at least 800 mL behind as of the past 24 hours regarding the tube feedings. Because of concerns for aspiration Reglan was added as well in the outpatient setting.  The patient was sent to the ER overnight because of respiratory distress, hyperglycemia and tachycardia. EMS was called to the home and the patient's pulse was noted to be in the 170s. He received 250 saline bolus with heart rate dropping to the 150s. She was also diaphoretic. In review of recent documentation while he was hospitalized in May his heart rate is between 70s and  100s. Since June past 2 visits with Dr. Marin Olp the patient has had tachycardia with heart rates in the 130s. Upon arrival to the ER rectal temperature was 103.4, respiratory rate was between 35 and 41, heart rate between 140s and 160s, initial BP was 129/89. He was placed on 100% NRB mask. He has been given additional fluid challenges in the ER up to 1 L with current heart rate between 120s to 130s with a BP of 109/56. Electrolyte panel was unremarkable except for mild hyperglycemia CBG 122. Alkaline phosphatase was elevated at 162 with AST 54 and ALT 84 and normal total bilirubin which is new. Troponin was 0.00. Lactic acid was elevated at 4.1. White count was 15,700 in setting of recent administration of one dose of 30 mg prednisolone. On 6/1 patient's white count had been 3400 and his platelets 75,000. Current platelet count 168,000. Neutrophils 94%. Urinalysis was unremarkable and chest x-ray questioned discoid right lobe infiltrate. Blood cultures and urine cultures have been obtained. Patient has been started on empiric broad-spectrum antibiotics. According to the mother the patient is not as alert as he typically is. Over the past 2 weeks patient has had 3 grand mal seizures witnessed which is not typical for him. Baclofen pump was last refilled on 5/26 by patient's pediatric neurologist Dr. Gaynell Face. Mother reports that since [place] was discontinued the neurologist has been slowly uptitrating the ONFI which is currently at 3 times a day but was waiting on final bone marrow biopsy reports before increasing the actual dosage. Patient typically wears a BiPAP at night. Mother is very conscientious and is very reluctant to leave the patient's bedside and is very willing to give input regarding the patient's medical care, current and past treatment regimens, and her suspicions about underlying etiology to recent problems.   Review of Systems   In addition to the HPI above,  Unable to obtain from the patient  since at baseline he is nonverbal. History of present illness obtained from the mother as above.  *A full 10 point Review of Systems was done, except as stated above, all other Review of Systems were negative.  Social History History  Substance Use Topics  . Smoking status:  Never Smoker   . Smokeless tobacco: Never Used     Comment: never used tobacco  . Alcohol Use: No    Resides at: Private residence  Lives with: Non-ambulatory  Ambulatory status: Full assist to motorized wheelchair   Family History Family History  Problem Relation Age of Onset  . Adopted: Yes     Prior to Admission medications   Medication Sig Start Date End Date Taking? Authorizing Provider  Acetaminophen (TYLENOL PO) Place 500 mg into feeding tube every 6 (six) hours as needed (fever).    Yes Historical Provider, MD  albuterol (PROVENTIL HFA;VENTOLIN HFA) 108 (90 BASE) MCG/ACT inhaler Inhale 2 puffs into the lungs every 4 (four) hours as needed for shortness of breath.   Yes Historical Provider, MD  albuterol (PROVENTIL) (2.5 MG/3ML) 0.083% nebulizer solution Take 2.5 mg by nebulization 2 (two) times daily as needed. 1 ampule twice daily and as needed (making 4 doses daily). Take a 8am and 8pm and then prn 6 and 7 are not used at the same time 10/11/12  Yes Elsie Stain, MD  Amino Acids-Protein Hydrolys (FEEDING SUPPLEMENT, PRO-STAT SUGAR FREE 64,) LIQD Take 30 mLs by mouth 2 (two) times daily. 06/27/14  Yes Velvet Bathe, MD  Ascorbic Acid (VITAMIN C) 500 MG/5ML LIQD 300 mg by PEG Tube route every morning.    Yes Historical Provider, MD  calcium carbonate, dosed in mg elemental calcium, 1250 MG/5ML by PEG Tube route 3 (three) times daily. 5 cc via peg tube. Give at 8am , 2pm, and 8pm   Yes Historical Provider, MD  cloBAZam (ONFI) 2.5 MG/ML solution Place 2.5 mL in gastrostomy tube 3 times daily 05/17/14  Yes Jodi Geralds, MD  DIASTAT ACUDIAL 20 MG GEL Place 12.5 mg rectally as needed. For seizure  lasting 2 minutes or longer or repetitive seizures.  No more than 1 dose every 12 hours or nasal versed (not both). 04/01/14  Yes Historical Provider, MD  dicyclomine (BENTYL) 10 MG/5ML syrup Place 10 mg into feeding tube every 8 (eight) hours as needed. Not to exceed 5 doses in 1 wk as needed for Abd cramps   Yes Historical Provider, MD  diphenoxylate-atropine (LOMOTIL) 2.5-0.025 MG/5ML liquid Take 10 mLs by mouth 4 (four) times daily as needed for diarrhea or loose stools.   Yes Historical Provider, MD  folic acid (FOLVITE) 1 MG tablet 1 mg by Feeding Tube route daily. 8:00am per G tube   Yes Historical Provider, MD  furosemide (LASIX) 10 MG/ML solution Place 20 mg into feeding tube daily as needed for fluid.    Yes Historical Provider, MD  gabapentin (NEURONTIN) 250 MG/5ML solution Take 18 mL 3 times daily Patient taking differently: Place 900 mg into feeding tube 3 (three) times daily. Give 73ms tid at 8am, 2pm and 8pm 05/01/14  Yes WJodi Geralds MD  ibuprofen (ADVIL,MOTRIN) 100 MG/5ML suspension Place 400 mg into feeding tube every 6 (six) hours as needed for fever (pain).   Yes Historical Provider, MD  ketoconazole (NIZORAL) 2 % cream Apply 1 application topically 2 (two) times daily as needed for irritation (yeast rash from antibiotics).    Yes Historical Provider, MD  lansoprazole (PREVACID SOLUTAB) 30 MG disintegrating tablet 30 mg by Feeding Tube route 2 (two) times daily. 7:30am and 7:30pm 09/07/12  Yes DLafayette Dragon MD  metoCLOPramide (REGLAN) 5 MG/5ML solution Place 10 mLs (10 mg total) into feeding tube 4 (four) times daily -  before meals and at bedtime.  07/12/14  Yes Volanda Napoleon, MD  Multiple Vitamins-Minerals (MULTIVITAMIN) LIQD 10 mLs by Feeding Tube route daily.    Yes Historical Provider, MD  Multiple Vitamins-Minerals (ZINC PO) 5 mLs by PEG Tube route daily. 244 mg / 5 mL zinc solution   Yes Historical Provider, MD  mupirocin ointment (BACTROBAN) 2 % Apply 1 application  topically as needed (to G-T site). 09/07/12  Yes Lafayette Dragon, MD  Nutritional Supplements (PROMOD PO) 30 mLs by Feeding Tube route 2 (two) times daily. Give at 10am and 5pm per  g tube   Yes Historical Provider, MD  Nutritional Supplements (TWOCAL HN) LIQD 237 mLs by Feeding Tube route. T.3 can at 46 cc hour x 12 hours - water 172 cc pre and post each and extra 172 bid.   Yes Historical Provider, MD  OVER THE COUNTER MEDICATION Apply 1 application topically as needed (after diaper changes). Balmex and Bag Balm paste made and applied after diaper changes   Yes Historical Provider, MD  OXYGEN-HELIUM IN Inhale into the lungs. Oxygen PRN to keep O2 Sat at 90%   Yes Historical Provider, MD  potassium chloride (KLOR-CON) 20 MEQ packet 20 mEq by Feeding Tube route daily.    Yes Historical Provider, MD  prednisoLONE (ORAPRED) 15 MG/5ML solution Administer 10 mL twice a day for 3 days, then 10 mL daily for 3 days, then 5 mL daily for 3 days. 07/12/14  Yes Volanda Napoleon, MD  Pseudoephedrine HCl (SUDAFED CHILDRENS) 15 MG/5ML LIQD Give 90 mg by tube 3 (three) times daily.    Yes Historical Provider, MD  simethicone (MYLICON) 409 MG chewable tablet 125 mg by Feeding Tube route See admin instructions. 157m caplet added to each can of Two Cal   Yes Historical Provider, MD  sodium phosphate (FLEET) enema Place 1 enema rectally daily as needed. follow package directions PRN. Help get pass gas   Yes Historical Provider, MD  sucralfate (CARAFATE) 1 GM/10ML suspension Place 1 g into feeding tube 4 (four) times daily. 7am, 130pm, 5pm, 8pm.  Administer alone 30 minutes prior to other medications.   Yes Historical Provider, MD  Water For Irrigation, Sterile (FREE WATER) SOLN Place 172 mLs into feeding tube 4 (four) times daily.   Yes Historical Provider, MD  zinc oxide (BALMEX) 11.3 % CREA cream Apply 1 application topically 5 (five) times daily. With each diaper change.   Yes Historical Provider, MD  baclofen (GABLOFEN)  40000 MCG/20ML SOLN 385.2 mcg by Intrathecal route continuous.  05/04/12   WJodi Geralds MD  midazolam (VERSED) 5 MG/ML injection Place 2 mLs (10 mg total) into the nose once. Draw up 126min 2 syringes. Remove blue vial access device. Attach syringe to nasal atomizer for intranasal administration. Give [id number] right nostril x 2 for seizures lasting 2 minutes or longer or for repetitive seizures in a short period of time. 11/05/13   [place]  OnabotulinumtoxinA (BOTOX IJ) Inject 300 Units as directed See admin instructions. Every 3 months    Historical Provider, MD    Allergies  Allergen Reactions  . Depakote [Divalproex Sodium]     Causes pancreatitis   . Vimpat [Lacosamide] Rash  . Keppra [Levetiracetam] Other (See Comments)    Bone marrow suppression  . Adhesive [Tape]     Rips skin off  . Amoxicillin-Pot Clavulanate Rash    Give w fluconazole then no rash  . Neulasta [Pegfilgrastim] Palpitations    Physical Exam  Vitals  Blood pressure 107/65, pulse 130, temperature 103.4 F (39.7 C), temperature source Rectal, resp. rate 27, SpO2 100 %.   General:  In no acute distress, appears chronically ill and somewhat lethargic  Psych: Unable to obtain accurate exam due to nonverbal state  Neuro: Awakens and opens eyes and occasionally focuses on interviewer, chronic but stable spastic quadriplegia, on gross exam appears to have intact cranial nerves II through XII but exam limited by patient's ability to participate, on gross exam appears to have intact sensation all 4 extremities and motor limited by known spastic quadriplegia and setting of cerebral palsy and known history of pediatric intraventricular hemorrhage  ENT:  Ears and Eyes appear Normal, Conjunctivae clear, PER. Moist oral mucosa without erythema or exudates.  Neck:  Supple, No lymphadenopathy appreciated  Respiratory:  Symmetrical chest wall movement, diminished air movement, coarse sounds right mid field,  100% NRB mask  Cardiac:  Sinus tachycardia with rates primarily in the 130s, No Murmurs, no LE edema noted, no JVD, No carotid bruits, peripheral pulses palpable at 2+  Abdomen:  Positive bowel sounds, Soft, Non tender, chronically protuberant,  No masses appreciated, GJ tube left quadrant-insertion site unremarkable, implanted baclofen pump right quadrant/nontender  Skin:  No Cyanosis, Normal Skin Turgor, No Skin Rash or Bruise.  Extremities: Symmetrical without obvious trauma or injury,  no effusions.  Data Review  CBC  Recent Labs Lab 07/13/14 0625  WBC 15.7*  HGB 10.0*  HCT 32.1*  PLT 168  MCV 103.5*  MCH 32.3  MCHC 31.2  RDW 18.3*  LYMPHSABS 0.3*  MONOABS 0.6  EOSABS 0.0  BASOSABS 0.0    Chemistries   Recent Labs Lab 07/13/14 0625  NA 144  K 3.9  CL 101  CO2 29  GLUCOSE 122*  BUN 28*  CREATININE 0.80  CALCIUM 9.6  AST 54*  ALT 84*  ALKPHOS 162*  BILITOT 0.7    CrCl cannot be calculated (Unknown ideal weight.).  No results for input(s): TSH, T4TOTAL, T3FREE, THYROIDAB in the last 72 hours.  Invalid input(s): FREET3  Coagulation profile No results for input(s): INR, PROTIME in the last 168 hours.  No results for input(s): DDIMER in the last 72 hours.  Cardiac Enzymes No results for input(s): CKMB, TROPONINI, MYOGLOBIN in the last 168 hours.  Invalid input(s): CK  Invalid input(s): POCBNP  Urinalysis    Component Value Date/Time   COLORURINE YELLOW 07/13/2014 0700   APPEARANCEUR CLEAR 07/13/2014 0700   LABSPEC 1.017 07/13/2014 0700   PHURINE 6.0 07/13/2014 0700   GLUCOSEU NEGATIVE 07/13/2014 0700   HGBUR NEGATIVE 07/13/2014 0700   BILIRUBINUR NEGATIVE 07/13/2014 0700   KETONESUR NEGATIVE 07/13/2014 0700   PROTEINUR >300* 07/13/2014 0700   UROBILINOGEN 0.2 07/13/2014 0700   NITRITE NEGATIVE 07/13/2014 0700   LEUKOCYTESUR NEGATIVE 07/13/2014 0700    Imaging results:   Ct Biopsy  06/26/2014   CLINICAL DATA:  Pancytopenia.  Need  for bone marrow biopsy.  EXAM: CT GUIDED ASPIRATE AND CORE BONE MARROW BIOPSY OF RIGHT ILIAC BONE  ANESTHESIA/SEDATION: No conscious sedation was administered.  PROCEDURE: The procedure risks, benefits, and alternatives were explained to the patient's mother. Questions regarding the procedure were encouraged and answered. The patient's mother understands and consents to the procedure.  The right gluteal region was prepped with Betadine. Sterile gown and sterile gloves were used for the procedure. Local anesthesia was provided with 1% Lidocaine.  Under CT guidance, an 11 gauge OnControl bone cutting needle was advanced from a posterior approach  into the right iliac bone. Needle positioning was confirmed with CT. Initial non heparinized and heparinized aspirate samples were obtained of bone marrow.  Core biopsy was performed through the OnControl needle. Single core biopsy sample was obtained.  COMPLICATIONS: None  FINDINGS: Due to the patient's cerebral palsy, the procedure was performed in a decubitus position with the left side down in order to maintain the airway in a more stable body position. The biopsy was technically successful. Inspection of initial aspirate did reveal visible particles. An intact core biopsy sample was obtained.  IMPRESSION: CT guided bone marrow biopsy of right posterior iliac bone with both aspirate and core samples obtained.   Electronically Signed   By: Aletta Edouard M.D.   On: 06/26/2014 13:59   Dg Chest Port 1 View  07/13/2014   CLINICAL DATA:  Respiratory distress. History of spastic quadriplegia/cerebral palsy.  EXAM: PORTABLE CHEST - 1 VIEW  COMPARISON:  Chest radiograph Jun 19, 2014  FINDINGS: Cardiomediastinal silhouette is unremarkable for this habitus limited examination with low lung volumes. Bandlike density RIGHT lower lobe with elevated RIGHT hemidiaphragm. No pneumothorax. Single lumen RIGHT chest Port-A-Cath with distal tip projecting distal superior vena cava,  unchanged. No pneumothorax. Thoracolumbar Harrington rods.  IMPRESSION: RIGHT lower lobe discoid atelectasis.   Electronically Signed   By: Elon Alas M.D.   On: 07/13/2014 06:18   Dg Chest Port 1 View  06/19/2014   CLINICAL DATA:  Fever since 06/15/2014.  EXAM: PORTABLE CHEST - 1 VIEW  COMPARISON:  Single view of the chest 05/11/2014 and 04/12/2014.  FINDINGS: Lung volumes are low as on the comparison examinations. Port-A-Cath is again seen. The lungs appear clear. Heart size is normal. No pneumothorax or pleural effusion. Spinal fusion hardware for scoliosis is again seen.  IMPRESSION: No acute disease.   Electronically Signed   By: Inge Rise M.D.   On: 06/19/2014 18:40   Dg Abd Portable 1v  06/21/2014   CLINICAL DATA:  Patient with history of small bowel obstruction.  EXAM: PORTABLE ABDOMEN - 1 VIEW  COMPARISON:  Abdominal radiograph 09/15/2012  FINDINGS: Gas is demonstrated within nondilated loops of large and small bowel in a nonobstructed pattern. Supine evaluation limited for the detection of free intraperitoneal air. Persistent elevation of the right hemidiaphragm. Minimal heterogeneous opacities left lung base. Spinal fusion hardware. Right lower quadrant stimulator device. G-tube projects over the left upper quadrant.  IMPRESSION: Nonobstructed bowel gas pattern.  Minimal heterogeneous opacities left lung base may represent atelectasis.   Electronically Signed   By: Lovey Newcomer M.D.   On: 06/21/2014 17:57      Assessment & Plan  Principal Problem:   Sepsis due to pneumonia -Admit to stepdown -Continue to cycle lactic acid noting initial lactic acid 4; if remains 4 or higher will need formal critical care medicine consultation -Continue dextrose IV fluids and bolus when necessary with normal saline if develops recurrent hypotension or worsened tachycardia noting patient chronically hypoalbuminemic and typically has volume shifts from a pulmonary and extremity standpoint that  further complicate volume resuscitation efforts -Check initial Procalcitonin and follow based on protocol -Blood cultures and urine culture obtained -Begin broad-spectrum empiric antibiotic -Suspect recurrent aspiration but will also check respiratory viral panel; mother reports patient did receive all immunizations including influenza this year  Active Problems:   Acute on chronic respiratory failure with hypoxia/? Reurrent aspiration pneumonia -Seems to be most likely source of current infectious process -Continue supportive care with oxygen -Continue nebs -Empiric antibiotic  as above -Recent outpatient start on Reglan and? Prednisolone (see OP oncology notes)    Elevated lactic acid level -Suspect related to poor perfusion in setting of sepsis noting also has mild transaminitis -Cycle every 3 hours per protocol and if not decreasing or begins to rise will need formal critical care medicine consultation    Fever -Appears to be related to aspiration pneumonia -Continue to follow trends and utilize supportive care with Tylenol    Dysphagia requring chronic J tube feeds -Mother reports that to prevent recurrent aspiration they have a very specific tube feeding protocol-we'll asked nutrition to review with family and write orders -Patient has a jejunostomy tube and therefore must be on a specific tube feeding which is not available here at the hospital (2 cal ATN)-family to bring tube feeding from home -Based on interview with mom it appears patient tolerates tube feeding best when up in wheelchair and mom requests we not start tube feeding until patient's wheelchair arrives from home    Generalized convulsive epilepsy -Had been relatively quiescent for this patient until recently noting that he has been experiencing several grand mal seizures over the past 2 weeks; mother states she has made pediatric neurologist Dr. Gaynell Face aware -No seizure activity seen since admission -Continue  preadmission dose of ONFI -Patient has at home intranasal versed and Valium to use for breakthrough seizures-need pharmacy to clarify dosage before ordering -Continue preadmission baclofen pump    OSA (obstructive sleep apnea) -Continue our of sleep CPAP    Acquired pancytopenia -Has received Neupogen through oncology office with improvement in platelets and white count -Continue prednisone taper as prior to admission    Hyperglycemia, drug-induced -Suspect related to prednisolone -Potentially exacerbated by acute infectious process -All his CBGs every 4 hours and provide SSI if indicated    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, quadriplegic -Wheelchair bound and nonverbal at baseline    Anemia of chronic disease -Hemoglobin at baseline of between 9 and 11 noting current hemoglobin 10    DVT Prophylaxis: SCDs-patient with history of pancytopenia requiring Neupogen so reluctant to give pharmacological DVT prophylaxis at this juncture  Family Communication:   Mother at bedside  Code Status:  Partial code: The only no response is "no defibrillation"  Condition: Guarded   Discharge disposition: Discharge back to home with parents was etiology of fever determined and sepsis physiology resolved  Time spent in minutes : 60      ELLIS,ALLISON L. ANP on 07/13/2014 at 10:08 AM  Between 7am to 7pm - Pager - [phone]  After 7pm [name] to [email or web address] - password [email or web address]  And look for the night coverage person covering me after hours  [facility]

## 2014-07-13 NOTE — ED Notes (Signed)
ONFI sent with pt to floor. Not administered at scheduled time. RN from [facility] has med.

## 2014-07-13 NOTE — Progress Notes (Signed)
Repeat lactic acid has decreased to 3.14. We'll continue current treatment plan and continue to cycle lactic acid. Remains on 100% NRB mask. Does not appear to be in respiratory distress noted to be comfortable in bed.  VS as of 10 am: 108/80 BP, 130 HR, 26 RR  Erin Hearing, ANP

## 2014-07-13 NOTE — ED Notes (Signed)
Pt cbg 101 

## 2014-07-13 NOTE — ED Notes (Signed)
Pt started having respiratory distress earlier tonight. Pt guardian accessed pt. Port and gave lasix. Pt. On prednisone CBG in the 200s. Pt. Is tachycardic in the 170s on EMS arrival. Pt received a 224ml fluid bolus with EMS and HR dropped to the 150s.Guardian states pt. Was diaphoretic on EMS arrival.

## 2014-07-13 NOTE — Progress Notes (Signed)
CRITICAL VALUE ALERT  Critical value received:  Lactic acid: 3.5  Date of notification:  07/13/14  Time of notification:  1829  Critical value read back:Yes.    Nurse who received alert:  Noreene Larsson  MD notified (1st page):  Dr. Charlies Silvers  Time of first page:  1845  MD notified (2nd page):  Time of second page:  Responding MD:  Dr. Charlies Silvers  Time MD responded:  1900

## 2014-07-13 NOTE — ED Notes (Signed)
Attempted to call report

## 2014-07-14 DIAGNOSIS — A419 Sepsis, unspecified organism: Principal | ICD-10-CM

## 2014-07-14 DIAGNOSIS — G4733 Obstructive sleep apnea (adult) (pediatric): Secondary | ICD-10-CM

## 2014-07-14 DIAGNOSIS — J189 Pneumonia, unspecified organism: Secondary | ICD-10-CM

## 2014-07-14 DIAGNOSIS — D619 Aplastic anemia, unspecified: Secondary | ICD-10-CM

## 2014-07-14 DIAGNOSIS — J690 Pneumonitis due to inhalation of food and vomit: Secondary | ICD-10-CM

## 2014-07-14 DIAGNOSIS — J9621 Acute and chronic respiratory failure with hypoxia: Secondary | ICD-10-CM

## 2014-07-14 LAB — CBC
HEMATOCRIT: 22 % — AB (ref 39.0–52.0)
Hemoglobin: 6.9 g/dL — CL (ref 13.0–17.0)
MCH: 32.7 pg (ref 26.0–34.0)
MCHC: 31.4 g/dL (ref 30.0–36.0)
MCV: 104.3 fL — AB (ref 78.0–100.0)
PLATELETS: 111 10*3/uL — AB (ref 150–400)
RBC: 2.11 MIL/uL — ABNORMAL LOW (ref 4.22–5.81)
RDW: 18.5 % — ABNORMAL HIGH (ref 11.5–15.5)
WBC: 6.5 10*3/uL (ref 4.0–10.5)

## 2014-07-14 LAB — COMPREHENSIVE METABOLIC PANEL
ALBUMIN: 2.8 g/dL — AB (ref 3.5–5.0)
ALK PHOS: 112 U/L (ref 38–126)
ALT: 59 U/L (ref 17–63)
ANION GAP: 10 (ref 5–15)
AST: 42 U/L — ABNORMAL HIGH (ref 15–41)
BILIRUBIN TOTAL: 1.3 mg/dL — AB (ref 0.3–1.2)
BUN: 13 mg/dL (ref 6–20)
CALCIUM: 8.7 mg/dL — AB (ref 8.9–10.3)
CHLORIDE: 105 mmol/L (ref 101–111)
CO2: 29 mmol/L (ref 22–32)
Creatinine, Ser: 0.71 mg/dL (ref 0.61–1.24)
GFR calc Af Amer: >60 mL/min (ref >60)
Glucose, Bld: 90 mg/dL (ref 65–99)
Potassium: 3.3 mmol/L — ABNORMAL LOW (ref 3.5–5.1)
Sodium: 144 mmol/L (ref 135–145)
Total Protein: 5.2 g/dL — ABNORMAL LOW (ref 6.5–8.1)

## 2014-07-14 LAB — OCCULT BLOOD X 1 CARD TO LAB, STOOL: Fecal Occult Bld: NEGATIVE

## 2014-07-14 LAB — GLUCOSE, CAPILLARY
GLUCOSE-CAPILLARY: 100 mg/dL — AB (ref 65–99)
GLUCOSE-CAPILLARY: 85 mg/dL (ref 65–99)
GLUCOSE-CAPILLARY: 87 mg/dL (ref 65–99)
Glucose-Capillary: 97 mg/dL (ref 65–99)
Glucose-Capillary: 89 mg/dL (ref 65–99)
Glucose-Capillary: 101 mg/dL — ABNORMAL HIGH (ref 65–99)

## 2014-07-14 LAB — LACTIC ACID, PLASMA: LACTIC ACID, VENOUS: 4.8 mmol/L — AB (ref 0.5–2.0)

## 2014-07-14 LAB — STREP PNEUMONIAE URINARY ANTIGEN: Strep Pneumo Urinary Antigen: NEGATIVE

## 2014-07-14 LAB — PREPARE RBC (CROSSMATCH)

## 2014-07-14 LAB — HIV ANTIBODY (ROUTINE TESTING W REFLEX): HIV Screen 4th Generation wRfx: NONREACTIVE

## 2014-07-14 MED ORDER — POTASSIUM CHLORIDE 20 MEQ/15ML (10%) PO SOLN
40.0000 meq | Freq: Three times a day (TID) | ORAL | Status: AC
  Administered 2014-07-14 (×2): 40 meq
  Filled 2014-07-14 (×2): qty 30

## 2014-07-14 MED ORDER — FUROSEMIDE 10 MG/ML IJ SOLN
INTRAMUSCULAR | Status: AC
  Filled 2014-07-14: qty 2

## 2014-07-14 MED ORDER — SODIUM CHLORIDE 0.9 % IV SOLN: Freq: Once | INTRAVENOUS | Status: DC

## 2014-07-14 MED ORDER — DIAZEPAM 10 MG RE GEL: 12.5000 mg | Freq: Once | RECTAL | Status: AC | PRN

## 2014-07-14 MED ORDER — DICYCLOMINE HCL 10 MG/5ML PO SOLN
10.0000 mg | Freq: Three times a day (TID) | ORAL | Status: DC | PRN
  Administered 2014-07-14: 10 mg
  Filled 2014-07-14 (×2): qty 5

## 2014-07-14 MED ORDER — FUROSEMIDE 10 MG/ML IJ SOLN
10.0000 mg | Freq: Once | INTRAMUSCULAR | Status: DC
Start: 2014-07-14 — End: 2014-07-14
  Administered 2014-07-14: 10 mg via INTRAVENOUS

## 2014-07-14 MED ORDER — FLUCONAZOLE 40 MG/ML PO SUSR
100.0000 mg | Freq: Every day | ORAL | Status: DC
  Administered 2014-07-14 – 2014-07-16 (×3): 100 mg via ORAL
  Filled 2014-07-14 (×4): qty 2.5

## 2014-07-14 MED ORDER — LEVALBUTEROL HCL 0.63 MG/3ML IN NEBU: 0.6300 mg | INHALATION_SOLUTION | RESPIRATORY_TRACT | Status: DC

## 2014-07-14 MED ORDER — DIAZEPAM 2.5 MG RE GEL: 12.5000 mg | RECTAL | Status: DC | PRN

## 2014-07-14 MED ORDER — FUROSEMIDE 10 MG/ML IJ SOLN
20.0000 mg | Freq: Once | INTRAMUSCULAR | Status: AC
  Administered 2014-07-14: 20 mg via INTRAVENOUS
  Filled 2014-07-14: qty 2

## 2014-07-14 MED ORDER — LEVALBUTEROL HCL 0.63 MG/3ML IN NEBU
0.6300 mg | INHALATION_SOLUTION | Freq: Four times a day (QID) | RESPIRATORY_TRACT | Status: DC
  Administered 2014-07-14 – 2014-07-15 (×6): 0.63 mg via RESPIRATORY_TRACT
  Filled 2014-07-14 (×10): qty 3

## 2014-07-14 MED ORDER — IPRATROPIUM BROMIDE 0.02 % IN SOLN
0.5000 mg | Freq: Four times a day (QID) | RESPIRATORY_TRACT | Status: DC
  Administered 2014-07-14 – 2014-07-15 (×6): 0.5 mg via RESPIRATORY_TRACT
  Filled 2014-07-14 (×6): qty 2.5

## 2014-07-14 MED ORDER — DEXTROSE 5 % IV SOLN
500.0000 mg | INTRAVENOUS | Status: DC
  Administered 2014-07-14 – 2014-07-15 (×2): 500 mg via INTRAVENOUS
  Filled 2014-07-14 (×3): qty 500

## 2014-07-14 NOTE — Progress Notes (Signed)
CRITICAL VALUE ALERT  Critical value received:  Hgb 6.9  Date of notification:  07/14/14 Time of notification:  1100  Critical value read back:Yes.    Nurse who received alert:  Noreene Larsson  MD notified (1st page):  Dr. Tyrell Antonio  Time of first page:  1113  MD notified (2nd page):  Time of second page:  Responding MD:  Dr. Tyrell Antonio  Time MD responded:  [phone]

## 2014-07-14 NOTE — Progress Notes (Signed)
[facility] PROGRESS NOTE  Jeffery [name] TDH:[id number] DOB: September 26, 1990 DOA: 07/13/2014 PCP: Marijean Bravo, MD  Assessment/Plan: 1-Sepsis:  Likely from aspiration PNA.  Sepsis criteria met on admission with hypotension, tachycardia, tachypnea, fever, leukocytosis, lactic acidosis. Source of infection thought to be due to aspiration pneumonia Follow Blood culture. Continue with Vancomycin and Zosyn.  Repeat lactic acid.   2-Dysphagia requring chronic J tube feeds Patient has a jejunostomy tube and therefore must be on a specific tube feeding which is not available here at the hospital (2 cal ATN)-family to bring tube feeding from home. Hold on nutrition today, mother request.   Acute on chronic respiratory failure with hypoxia/? Reurrent aspiration pneumonia Improved on Venturi mask for comfort. Was tolerating 4 L [place]. Will ad nebulizer treatment.  BIPAP as needed.   Generalized convulsive epilepsy Had been relatively quiescent for this patient until recently noting that he has been experiencing several grand mal seizures over the past 2 weeks; mother states she has made pediatric neurologist Dr. Gaynell Face aware -No seizure activity seen since admission -Continue preadmission dose of ONFI -Patient has at home intranasal versed and Valium to use for breakthrough seizures. Ordered placed.  -Continue preadmission baclofen pump  OSA (obstructive sleep apnea) -Continue our of sleep CPAP   Acquired pancytopenia -Has received Neupogen through oncology office with improvement in platelets and white count -Continue prednisone taper as prior to admission -will add Dr Jonette Eva to consulting team.    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  Family Communication:  Disposition Plan: Remain in the step down.    Consultants:  none  Procedures:  none  Antibiotics:  Vancomycin 6-11  Cefepime 6-11  HPI/Subjective: History obtain from mother.  She  Think he is doing better. At home sometime if he gets tired he would use BIPAP during day. But specially at night.  He has nasal septal deviation left. He does better with venturi mask.    Objective: Filed Vitals:   07/14/14 0616  BP:   Pulse: 102  Temp:   Resp: 18    Intake/Output Summary (Last 24 hours) at 07/14/14 0725 Last data filed at 07/14/14 0528  Gross per 24 hour  Intake   2465 ml  Output    575 ml  Net   1890 ml   Filed Weights   07/13/14 1546 07/14/14 0528  Weight: 56 kg (123 lb 7.3 oz) 58 kg (127 lb 13.9 oz)    Exam:   General:  Open eyes, move head side to side  Cardiovascular: S 1, S 2 RRR, tachycardic  Respiratory: Ronchus left  Abdomen: BS present, soft, nt  Musculoskeletal: no edema  Data Reviewed: Basic Metabolic Panel:  Recent Labs Lab 07/13/14 0625  NA 144  K 3.9  CL 101  CO2 29  GLUCOSE 122*  BUN 28*  CREATININE 0.80  CALCIUM 9.6   Liver Function Tests:  Recent Labs Lab 07/13/14 0625  AST 54*  ALT 84*  ALKPHOS 162*  BILITOT 0.7  PROT 7.0  ALBUMIN 3.6   No results for input(s): LIPASE, AMYLASE in the last 168 hours. No results for input(s): AMMONIA in the last 168 hours. CBC:  Recent Labs Lab 07/13/14  0625  WBC 15.7*  NEUTROABS 14.8*  HGB 10.0*  HCT 32.1*  MCV 103.5*  PLT 168   Cardiac Enzymes: No results for input(s): CKTOTAL, CKMB, CKMBINDEX, TROPONINI in the last 168 hours. BNP (last 3 results) No results for input(s): BNP in the last 8760 hours.  ProBNP (last 3 results) No results for input(s): PROBNP in the last 8760 hours.  CBG:  Recent Labs Lab 07/13/14 1311 07/13/14 1714  07/13/14 1923 07/13/14 2335 07/14/14 0521  GLUCAP 110* 97 86 100* 97    Recent Results (from the past 240 hour(s))  MRSA PCR Screening     Status: None   Collection Time: 07/13/14  4:05 PM  Result Value Ref Range Status   MRSA by PCR NEGATIVE NEGATIVE Final    Comment:        The GeneXpert MRSA Assay (FDA approved for NASAL specimens only), is one component of a comprehensive MRSA colonization surveillance program. It is not intended to diagnose MRSA infection nor to guide or monitor treatment for MRSA infections.      Studies: Dg Chest Port 1 View  07/13/2014   CLINICAL DATA:  Respiratory distress. History of spastic quadriplegia/cerebral palsy.  EXAM: PORTABLE CHEST - 1 VIEW  COMPARISON:  Chest radiograph Jun 19, 2014  FINDINGS: Cardiomediastinal silhouette is unremarkable for this habitus limited examination with low lung volumes. Bandlike density RIGHT lower lobe with elevated RIGHT hemidiaphragm. No pneumothorax. Single lumen RIGHT chest Port-A-Cath with distal tip projecting distal superior vena cava, unchanged. No pneumothorax. Thoracolumbar Harrington rods.  IMPRESSION: RIGHT lower lobe discoid atelectasis.   Electronically Signed   By: Elon Alas M.D.   On: 07/13/2014 06:18    Scheduled Meds: . ascorbic acid  300 mg Per Tube Q24H  . cloBAZam  2.5 mg Per Tube 3 times per day  . feeding supplement (PRO-STAT SUGAR FREE 64)  30 mL Oral BID  . folic acid  1 mg Per Tube Daily  . gabapentin  900 mg Per Tube 3 times per day  . insulin aspart  0-9 Units Subcutaneous 6 times per day  . labetalol  100 mg Oral BID  . lansoprazole  30 mg Per J Tube 2 times per day  . metoCLOPramide  10 mg Per Tube TID AC & HS  . midazolam  10 mg Nasal Once  . piperacillin-tazobactam (ZOSYN)  IV  3.375 g Intravenous 3 times per day  . potassium chloride  20 mEq Per Tube Q24H  . prednisoLONE  30 mg Per Tube BID   Followed by  . [START ON 07/16/2014] prednisoLONE  30 mg Oral QAC breakfast    Followed by  . [START ON 07/19/2014] prednisoLONE  5.1 mg Oral QAC breakfast  . pseudoephedrine  90 mg Per Tube 3 times per day  . sodium chloride  3 mL Intravenous Q12H  . sucralfate  1 g Per Tube 4 times per day  . vancomycin  750 mg Intravenous Q8H  . zinc oxide  1 application Topical 5 X Daily   Continuous Infusions: . dextrose 5% lactated ringers 125 mL/hr at 07/14/14 1694    Principal Problem:   Sepsis due to pneumonia Active Problems:   History of prematurity with neonatal intraventricular hemorrhage   Dysphagia requring chronic J tube feeds   Generalized convulsive epilepsy   Aspiration pneumonia   Acute on chronic respiratory failure with hypoxia   OSA (obstructive sleep apnea)   Cerebral palsy, quadriplegic   Acquired pancytopenia   Anemia of  chronic disease   Elevated lactic acid level   Fever   Hyperglycemia, drug-induced    Time spent: 35 minutes.     Niel Hummer A  [facility] Pager [phone]. If 7PM-7AM, please contact night-coverage at [email or web address], password [email or web address] 07/14/2014, 7:25 AM  LOS: 1 day

## 2014-07-14 NOTE — Progress Notes (Signed)
CRITICAL VALUE ALERT  Critical value received:  Lactic acid 4.8  Date of notification: 07/14/14  Time of notification: 1130  Critical value read back: yes  Nurse who received alert: Evlyn Kanner, RN  MD notified (1st page): Dr. Tyrell Antonio  Time of first page: 1138  MD notified (2nd page):  Time of second page:  Responding MD: Dr. Tyrell Antonio   Time MD responded: 1140

## 2014-07-14 NOTE — Consult Note (Signed)
Name: Jeffery [name] MRN: [id number] DOB: 08-09-1990    ADMISSION DATE:  07/13/2014 CONSULTATION DATE:  [id number]  REFERRING MD :  TRH  CHIEF COMPLAINT:  Acute on chronic respiratory failure.  BRIEF PATIENT DESCRIPTION: 24 year old male with history of CP who is well known to our service and to me personally.  Patient presents to the ED with SOB and desaturation.  Patient was placed on O2 and admitted to SDU.  Patient frequently uses BiPAP at night for rest and OSA but has not been using it for a few days.  On 6/12 the patient was noted to have increased WOB and was placed again on BiPAP and PCCM was consulted for acute on chronic respiratory failure.  SIGNIFICANT EVENTS  6/11 admission 6/12 PCCM to see.  STUDIES:  CXR 6/11 with ? Of retrocardiac infiltrate.   HISTORY OF PRESENT ILLNESS:  24 year old male with history of CP who is well known to our service and to me personally.  Patient presents to the ED with SOB and desaturation.  Patient was placed on O2 and admitted to SDU.  Patient frequently uses BiPAP at night for rest and OSA but has not been using it for a few days.  On 6/12 the patient was noted to have increased WOB and was placed again on BiPAP and PCCM was consulted for acute on chronic respiratory failure.   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  has past surgical history that includes Mole removal ("several B/T 2008-2010"); Jejunostomy feeding tube (03/08/2013; ~ 09/2013; ~ 01/2014); Gastrostomy tube placement (11./02/1998); Button change (12/15/2010); PEG placement (10/07/2011); Inguinal hernia repair (Bilateral, 1992); Retinopathy of prematurity surgery (1992); Achilles tendon lengthening (Bilateral, 12/1998); Tendon release (12/1998); Infusion pump implantation (07/25/2000; 2013); PEG placement (N/A, 06/05/2012); Strabismus surgery (Bilateral, 1993); Back surgery (~ 2008); Tendon repair (Bilateral, 09/05/2012); PEG placement (N/A, 09/13/2012); Flexible sigmoidoscopy (N/A, 10/30/2012); PEG placement (N/A, 11/15/2012); Gastrostomy tube placement (11./02/1998); Rectal biopsy (10/29/2012); Portacath placement (Right, 11/25/2012); and Eye surgery. Prior to Admission medications   Medication Sig Start Date End Date Taking? Authorizing Provider  Acetaminophen (TYLENOL PO) Place 500 mg into feeding tube every 6 (six) hours as needed (fever).    Yes Historical Provider, MD  albuterol (PROVENTIL HFA;VENTOLIN HFA) 108 (90 BASE) MCG/ACT inhaler Inhale 2 puffs into the lungs every 4 (four) hours as needed for shortness of breath.   Yes Historical Provider, MD  albuterol (PROVENTIL) (2.5 MG/3ML) 0.083% nebulizer solution Take 2.5 mg by nebulization 2 (two) times daily as needed. 1 ampule twice daily and as needed (making 4 doses daily). Take a 8am and 8pm and then prn 6 and 7 are not used at the same time 10/11/12  Yes Elsie Stain, MD  Amino Acids-Protein Hydrolys (FEEDING SUPPLEMENT, PRO-STAT SUGAR FREE 64,) LIQD Take 30 mLs by mouth 2 (two) times daily. 06/27/14  Yes Velvet Bathe, MD  Ascorbic Acid (VITAMIN C) 500 MG/5ML LIQD 300 mg by PEG Tube route every morning.    Yes Historical Provider, MD  calcium carbonate, dosed  in mg elemental calcium, 1250 MG/5ML by PEG Tube route 3 (three) times daily. 5 cc via peg tube. Give at 8am , 2pm, and  8pm   Yes Historical Provider, MD  cloBAZam (ONFI) 2.5 MG/ML solution Place 2.5 mL in gastrostomy tube 3 times daily 05/17/14  Yes Jodi Geralds, MD  DIASTAT ACUDIAL 20 MG GEL Place 12.5 mg rectally as needed. For seizure lasting 2 minutes or longer or repetitive seizures.  No more than 1 dose every 12 hours or nasal versed (not both). 04/01/14  Yes Historical Provider, MD  dicyclomine (BENTYL) 10 MG/5ML syrup Place 10 mg into feeding tube every 8 (eight) hours as needed. Not to exceed 5 doses in 1 wk as needed for Abd cramps   Yes Historical Provider, MD  diphenoxylate-atropine (LOMOTIL) 2.5-0.025 MG/5ML liquid Take 10 mLs by mouth 4 (four) times daily as needed for diarrhea or loose stools.   Yes Historical Provider, MD  folic acid (FOLVITE) 1 MG tablet 1 mg by Feeding Tube route daily. 8:00am per G tube   Yes Historical Provider, MD  furosemide (LASIX) 10 MG/ML solution Place 20 mg into feeding tube daily as needed for fluid.    Yes Historical Provider, MD  gabapentin (NEURONTIN) 250 MG/5ML solution Take 18 mL 3 times daily Patient taking differently: Place 900 mg into feeding tube 3 (three) times daily. Give 19mls tid at 8am, 2pm and 8pm 05/01/14  Yes Jodi Geralds, MD  ibuprofen (ADVIL,MOTRIN) 100 MG/5ML suspension Place 400 mg into feeding tube every 6 (six) hours as needed for fever (pain).   Yes Historical Provider, MD  ketoconazole (NIZORAL) 2 % cream Apply 1 application topically 2 (two) times daily as needed for irritation (yeast rash from antibiotics).    Yes Historical Provider, MD  lansoprazole (PREVACID SOLUTAB) 30 MG disintegrating tablet 30 mg by Feeding Tube route 2 (two) times daily. 7:30am and 7:30pm 09/07/12  Yes Lafayette Dragon, MD  metoCLOPramide (REGLAN) 5 MG/5ML solution Place 10 mLs (10 mg total) into feeding tube 4 (four) times daily -  before meals and at bedtime. 07/12/14  Yes Volanda Napoleon, MD  Multiple Vitamins-Minerals (MULTIVITAMIN) LIQD 10 mLs by Feeding Tube route  daily.    Yes Historical Provider, MD  Multiple Vitamins-Minerals (ZINC PO) 5 mLs by PEG Tube route daily. 244 mg / 5 mL zinc solution   Yes Historical Provider, MD  mupirocin ointment (BACTROBAN) 2 % Apply 1 application topically as needed (to G-T site). 09/07/12  Yes Lafayette Dragon, MD  Nutritional Supplements (PROMOD PO) 30 mLs by Feeding Tube route 2 (two) times daily. Give at 10am and 5pm per  g tube   Yes Historical Provider, MD  Nutritional Supplements (TWOCAL HN) LIQD 237 mLs by Feeding Tube route. T.3 can at 46 cc hour x 12 hours - water 172 cc pre and post each and extra 172 bid.   Yes Historical Provider, MD  OVER THE COUNTER MEDICATION Apply 1 application topically as needed (after diaper changes). Balmex and Bag Balm paste made and applied after diaper changes   Yes Historical Provider, MD  OXYGEN-HELIUM IN Inhale into the lungs. Oxygen PRN to keep O2 Sat at 90%   Yes Historical Provider, MD  potassium chloride (KLOR-CON) 20 MEQ packet 20 mEq by Feeding Tube route daily.    Yes Historical Provider, MD  prednisoLONE (ORAPRED) 15 MG/5ML solution Administer 10 mL twice a day for 3 days, then 10 mL daily for 3 days, then 5  mL daily for 3 days. 07/12/14  Yes Volanda Napoleon, MD  Pseudoephedrine HCl (SUDAFED CHILDRENS) 15 MG/5ML LIQD Give 90 mg by tube 3 (three) times daily.    Yes Historical Provider, MD  simethicone (MYLICON) 301 MG chewable tablet 125 mg by Feeding Tube route See admin instructions. 125mg  caplet added to each can of Two Cal   Yes Historical Provider, MD  sodium phosphate (FLEET) enema Place 1 enema rectally daily as needed. follow package directions PRN. Help get pass gas   Yes Historical Provider, MD  sucralfate (CARAFATE) 1 GM/10ML suspension Place 1 g into feeding tube 4 (four) times daily. 7am, 130pm, 5pm, 8pm.  Administer alone 30 minutes prior to other medications.   Yes Historical Provider, MD  Water For Irrigation, Sterile (FREE WATER) SOLN Place 172 mLs into feeding tube  4 (four) times daily.   Yes Historical Provider, MD  zinc oxide (BALMEX) 11.3 % CREA cream Apply 1 application topically 5 (five) times daily. With each diaper change.   Yes Historical Provider, MD  baclofen (GABLOFEN) 40000 MCG/20ML SOLN 385.2 mcg by Intrathecal route continuous.  05/04/12   Jodi Geralds, MD  midazolam (VERSED) 5 MG/ML injection Place 2 mLs (10 mg total) into the nose once. Draw up 64ml in 2 syringes. Remove blue vial access device. Attach syringe to nasal atomizer for intranasal administration. Give 70ml in right nostril x 2 for seizures lasting 2 minutes or longer or for repetitive seizures in a short period of time. 11/05/13   Rockwell Germany, NP  OnabotulinumtoxinA (BOTOX IJ) Inject 300 Units as directed See admin instructions. Every 3 months    Historical Provider, MD   Allergies  Allergen Reactions  . Depakote [Divalproex Sodium]     Causes pancreatitis   . Vimpat [Lacosamide] Rash  . Keppra [Levetiracetam] Other (See Comments)    Bone marrow suppression  . Adhesive [Tape]     Rips skin off  . Amoxicillin-Pot Clavulanate Rash    Give w fluconazole then no rash  . Neulasta [Pegfilgrastim] Palpitations    FAMILY HISTORY:  family history is not on file. He was adopted. SOCIAL HISTORY:  reports that he has never smoked. He has never used smokeless tobacco. He reports that he does not drink alcohol or use illicit drugs.  REVIEW OF SYSTEMS:   Patient is non-verbal  SUBJECTIVE: Patient is non-verbal.  VITAL SIGNS: Temp:  [98.1 F (36.7 C)-100.4 F (38 C)] 99.1 F (37.3 C) (06/12 1100) Pulse Rate:  [90-129] 110 (06/12 1200) Resp:  [11-32] 15 (06/12 1200) BP: (96-115)/(50-67) 115/64 mmHg (06/12 1200) SpO2:  [98 %-100 %] 100 % (06/12 1200) FiO2 (%):  [40 %-100 %] 40 % (06/12 1200) Weight:  [56 kg (123 lb 7.3 oz)-58 kg (127 lb 13.9 oz)] 58 kg (127 lb 13.9 oz) (06/12 0528)  PHYSICAL EXAMINATION: General:  Chronically ill appearing contracted male, on BiPAP  NAD Neuro:  Contracted, non-verbal, not following commands but moving spontaneously and withdrawing to noxious stimuli. HEENT:  [place]/AT, PERRL, EOM-I and MMM Cardiovascular:  RRR, Nl S1/S2, -M/R/G. Lungs:  Coarse BS diffusely. Abdomen:  Soft, NT, ND and +BS. Musculoskeletal:  -edema and -tenderness. Skin:  Intact, port-a-cath noted.   Recent Labs Lab 07/13/14 0625 07/14/14 1030  NA 144 144  K 3.9 3.3*  CL 101 105  CO2 29 29  BUN 28* 13  CREATININE 0.80 0.71  GLUCOSE 122* 90    Recent Labs Lab 07/13/14 0625 07/14/14 1030  HGB 10.0* 6.9*  HCT 32.1* 22.0*  WBC 15.7* 6.5  PLT 168 111*   Dg Chest Port 1 View  07/13/2014   CLINICAL DATA:  Respiratory distress. History of spastic quadriplegia/cerebral palsy.  EXAM: PORTABLE CHEST - 1 VIEW  COMPARISON:  Chest radiograph Jun 19, 2014  FINDINGS: Cardiomediastinal silhouette is unremarkable for this habitus limited examination with low lung volumes. Bandlike density RIGHT lower lobe with elevated RIGHT hemidiaphragm. No pneumothorax. Single lumen RIGHT chest Port-A-Cath with distal tip projecting distal superior vena cava, unchanged. No pneumothorax. Thoracolumbar Harrington rods.  IMPRESSION: RIGHT lower lobe discoid atelectasis.   Electronically Signed   By: Elon Alas M.D.   On: 07/13/2014 06:18    ASSESSMENT / PLAN:  24 year old male with acute on chronic respiratory failure now requiring BiPAP.  I have known this patient for approximately 2 years and very familiar with his mother who is a retired Therapist, sports and who is very involved in his care.  I have seen him recover well aspiration pneumonias.  However, now that patient also developed bone marrow suppression the picture changes slightly.  I have little doubt that if the patient is intubated he will never be extubated.  The mother rightfully so does not want ever trached therefore it would make to sense to intubate him in the first place.  Moreover, the patient's chest wall is very  deformed and since he is DNI then would not perform CPR or electric shock.  We discussed this matter at length and after discussing the points above we came to an agreement that DNR is most appropriate.  - Make BiPAP available as needed for comfort but recommend its use at night. - Add zithromax for 5 days for atypical coverage. - F/U on respiratory viral panel.  Influenza is negative. - Make patient full DNR. - Mother evidently has resources outside the hospital for hospice as she expressed interest in home hospice but does not want to talk to palliative care in the hospital yet.  She believes he would do better dying at home. - Increase lasix to 20 mg IV between two units of blood. - Replace potassium. - Transfusion order for two units of blood in place.  PCCM will be available PRN.  The patient is critically ill with multiple organ systems failure and requires high complexity decision making for assessment and support, frequent evaluation and titration of therapies, application of advanced monitoring technologies and extensive interpretation of multiple databases.   Critical Care Time devoted to patient care services described in this note is  35  Minutes. This time reflects time of care of this signee Dr Jennet Maduro. This critical care time does not reflect procedure time, or teaching time or supervisory time of PA/NP/Med student/Med Resident etc but could involve care discussion time.  Rush Farmer, M.D. [facility] Pulmonary/Critical Care Medicine. Pager: [phone]. After hours pager: [phone].  07/14/2014, 1:59 PM

## 2014-07-14 NOTE — Progress Notes (Signed)
Pt has limited venous access with only Port-a-Cath with antibiotics currently infusing (Zosyn and Zithromax). Pt has orders for two units of PRBCs to be infused, which will require additional access. Bedside RN attempted to start PIV but was unsuccessful. Order placed for IV Team consult for IV start for limited access. MD notified of reason for delay in blood transfusion. Will pause antibiotic infusions for now and transfuse PRBCs via Port-a-Cath, with hopes of obtaining a peripheral IV and infusing the remainder of antibiotics via that route.

## 2014-07-15 LAB — DIFFERENTIAL
Basophils Absolute: 0 10*3/uL (ref 0.0–0.1)
Basophils Relative: 0 % (ref 0–1)
EOS ABS: 0 10*3/uL (ref 0.0–0.7)
EOS PCT: 0 % (ref 0–5)
LYMPHS ABS: 0.2 10*3/uL — AB (ref 0.7–4.0)
LYMPHS PCT: 7 % — AB (ref 12–46)
MONO ABS: 0.1 10*3/uL (ref 0.1–1.0)
MONOS PCT: 4 % (ref 3–12)
Neutro Abs: 3 10*3/uL (ref 1.7–7.7)
Neutrophils Relative %: 89 % — ABNORMAL HIGH (ref 43–77)

## 2014-07-15 LAB — GLUCOSE, CAPILLARY
GLUCOSE-CAPILLARY: 132 mg/dL — AB (ref 65–99)
Glucose-Capillary: 115 mg/dL — ABNORMAL HIGH (ref 65–99)
Glucose-Capillary: 116 mg/dL — ABNORMAL HIGH (ref 65–99)
Glucose-Capillary: 135 mg/dL — ABNORMAL HIGH (ref 65–99)
Glucose-Capillary: 137 mg/dL — ABNORMAL HIGH (ref 65–99)
Glucose-Capillary: 149 mg/dL — ABNORMAL HIGH (ref 65–99)

## 2014-07-15 LAB — CBC
HCT: 31.8 % — ABNORMAL LOW (ref 39.0–52.0)
Hemoglobin: 10.1 g/dL — ABNORMAL LOW (ref 13.0–17.0)
MCH: 30.6 pg (ref 26.0–34.0)
MCHC: 31.8 g/dL (ref 30.0–36.0)
MCV: 96.4 fL (ref 78.0–100.0)
PLATELETS: 112 10*3/uL — AB (ref 150–400)
RBC: 3.3 MIL/uL — ABNORMAL LOW (ref 4.22–5.81)
RDW: 19 % — ABNORMAL HIGH (ref 11.5–15.5)
WBC: 3.3 10*3/uL — ABNORMAL LOW (ref 4.0–10.5)

## 2014-07-15 LAB — VANCOMYCIN, TROUGH: Vancomycin Tr: 25 ug/mL — ABNORMAL HIGH (ref 10.0–20.0)

## 2014-07-15 LAB — BASIC METABOLIC PANEL
Anion gap: 10 (ref 5–15)
BUN: 12 mg/dL (ref 6–20)
CO2: 29 mmol/L (ref 22–32)
Calcium: 9.1 mg/dL (ref 8.9–10.3)
Chloride: 105 mmol/L (ref 101–111)
Creatinine, Ser: 0.77 mg/dL (ref 0.61–1.24)
Glucose, Bld: 133 mg/dL — ABNORMAL HIGH (ref 65–99)
Potassium: 4.3 mmol/L (ref 3.5–5.1)
SODIUM: 144 mmol/L (ref 135–145)

## 2014-07-15 LAB — LEGIONELLA ANTIGEN, URINE

## 2014-07-15 LAB — HEMOGLOBIN A1C
Hgb A1c MFr Bld: 5.1 % (ref 4.8–5.6)
MEAN PLASMA GLUCOSE: 100 mg/dL

## 2014-07-15 LAB — CLOSTRIDIUM DIFFICILE BY PCR: Toxigenic C. Difficile by PCR: NEGATIVE

## 2014-07-15 LAB — URINE CULTURE: Colony Count: 50000

## 2014-07-15 LAB — PROCALCITONIN: Procalcitonin: 0.19 ng/mL

## 2014-07-15 MED ORDER — TWOCAL HN PO LIQD
237.0000 mL | ORAL | Status: DC
  Administered 2014-07-15 – 2014-07-16 (×2): 237 mL

## 2014-07-15 MED ORDER — VANCOMYCIN HCL IN DEXTROSE 750-5 MG/150ML-% IV SOLN
750.0000 mg | Freq: Two times a day (BID) | INTRAVENOUS | Status: DC
  Administered 2014-07-15 – 2014-07-16 (×3): 750 mg via INTRAVENOUS
  Filled 2014-07-15 (×3): qty 150

## 2014-07-15 MED ORDER — FREE WATER
180.0000 mL | Freq: Three times a day (TID) | Status: DC
  Administered 2014-07-16 (×2): 180 mL

## 2014-07-15 MED ORDER — JEVITY 1.2 CAL PO LIQD: 1000.0000 mL | ORAL | Status: DC

## 2014-07-15 MED ORDER — NON FORMULARY: 240.0000 mL | Freq: Every day | Status: DC

## 2014-07-15 MED ORDER — PRO-STAT SUGAR FREE PO LIQD
30.0000 mL | Freq: Two times a day (BID) | ORAL | Status: DC
  Administered 2014-07-15 – 2014-07-16 (×2): 30 mL
  Filled 2014-07-15 (×4): qty 30

## 2014-07-15 NOTE — ED Provider Notes (Signed)
CSN: [id number]     Arrival date & time 07/13/14  0535 History   First MD Initiated Contact with Patient 07/13/14 [phone]     Chief Complaint  Patient presents with  . 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 Mother reports that patient started having elevated HR during the night, worse than normal - so she decided to bring him in the [place] had neupogen and prednisone started earlier today. Pt has had hx of drug reactions in the past. She was unaware of the fevers. Pt has had aspirations in the past.  In ED, BP was 89/46, T max 103.4 F, RR 16-41, HR 133 - 161, oxygen saturation 92% on Welch oxygen support.   The history is provided by the patient.    Past Medical History  Diagnosis Date  . CP (cerebral palsy), spastic, quadriplegic   . Osteoporosis   . Undescended testes   . Seasonal allergies   . IVH (intraventricular hemorrhage) 1990/12/13    Grade IV  . Hip dislocation, bilateral   . Dysphagia   . Retinopathy of prematurity   . 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 Neuromuscular scoliosis   . Osteoporosis   . Complex partial seizures   . Generalized convulsive epilepsy without mention of intractable epilepsy   . Sinus bradycardia     HR drops to 38-40 while sleeping  . Blister of right heel     fluid filled; origin unknown  . Kidney stones     ?  Marland Kitchen Pneumonia      chronic pneumonia ,respitory failure dx Augest 2014  . Aspiration pneumonia     "chronic" (04/12/2014)  . OSA treated with BiPAP     "since age 89"   . Anemia   . 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 GERD (gastroesophageal reflux disease)   . Epilepsy   . Spastic quadriplegia   . Neutropenia 07/03/2014   Past Surgical History  Procedure Laterality Date  . Mole removal  "several B/T 2008-2010"    "from all over"  . Jejunostomy feeding tube  03/08/2013; ~ 09/2013; ~ 01/2014    "transgastric-jujunal feeding tube"  . Gastrostomy tube placement  11./02/1998       . Button change  12/15/2010    Procedure: BUTTON CHANGE;  Surgeon: Gatha Mayer, MD;  Location: [facility];  Service: Endoscopy;  Laterality: N/A;  . Peg placement  10/07/2011    Procedure: PERCUTANEOUS ENDOSCOPIC GASTROSTOMY (PEG) REPLACEMENT;  Surgeon: Lafayette Dragon, MD;  Location: [facility];  Service: Endoscopy;  Laterality: N/A;  Needs 18 F 2.5 button ordered-dl  . Inguinal hernia repair Bilateral 1992  . Retinopathy of prematurity surgery  1992  . Achilles tendon lengthening Bilateral 12/1998  . Tendon release  12/1998    Soft tissue releases  wrists and fingers [Other]  . Infusion pump implantation  07/25/2000; 2013    Intrathecal baclofen pump  . Peg placement N/A 06/05/2012    Procedure: PERCUTANEOUS ENDOSCOPIC GASTROSTOMY (PEG) REPLACEMENT;  Surgeon: Lafayette Dragon, MD;  Location: [facility];  Service: Endoscopy;  Laterality: N/A;  button 20fr.2.5cm  .  Strabismus surgery Bilateral 1993    "3 on right; 2 on left"  . Back surgery  ~ 2008    Harrington Rods in back needs to be log rolled  . Tendon repair Bilateral 09/05/2012    Procedure: LENGTHENING OF DIGITAL FLEXOR TENDONS BILTERAL HANDS;  Surgeon: Jolyn Nap, MD;  Location: [facility];  Service: Orthopedics;  Laterality: Bilateral;  . Peg placement N/A 09/13/2012    Procedure: PERCUTANEOUS ENDOSCOPIC GASTROSTOMY (PEG) REPLACEMENT;  Surgeon: Lafayette Dragon, MD;  Location: [facility];  Service: Endoscopy;  Laterality: N/A;  . Flexible sigmoidoscopy N/A 10/30/2012    Procedure: FLEXIBLE  SIGMOIDOSCOPY;  Surgeon: Cleotis Nipper, MD;  Location: [facility];  Service: Endoscopy;  Laterality: N/A;  . Peg placement N/A 11/15/2012    Procedure: PERCUTANEOUS ENDOSCOPIC GASTROSTOMY (PEG) REPLACEMENT;  Surgeon: Jeryl Columbia, MD;  Location: [facility] END[facility]COPY;  Service: Endoscopy;  Laterality: N/A;  . Gastrostomy tube placement  11./02/1998  . Rectal biopsy  10/29/2012    S/P diarrhea from Vancomycin  . Portacath placement Right 11/25/2012    chest  . Eye surgery     Family History  Problem Relation Age of Onset  . Adopted: Yes   History  Substance Use Topics  . Smoking status: Never Smoker   . Smokeless tobacco: Never Used     Comment: never used tobacco  . Alcohol Use: No    Review of Systems  Unable to perform ROS: Patient nonverbal      Allergies  Depakote; Vimpat; Keppra; Adhesive; Amoxicillin-pot clavulanate; and Neulasta  Home Medications   Prior to Admission medications   Medication Sig Start Date End Date Taking? Authorizing Provider  Acetaminophen (TYLENOL PO) Place 500 mg into feeding tube every 6 (six) hours as needed (fever).    Yes Historical Provider, MD  albuterol (PROVENTIL HFA;VENTOLIN HFA) 108 (90 BASE) MCG/ACT inhaler Inhale 2 puffs into the lungs every 4 (four) hours as needed for shortness of breath.   Yes Historical Provider, MD  albuterol (PROVENTIL) (2.5 MG/3ML) 0.083% nebulizer solution Take 2.5 mg by nebulization 2 (two) times daily as needed. 1 ampule twice daily and as needed (making 4 doses daily). Take a 8am and 8pm and then prn 6 and 7 are not used at the same time 10/11/12  Yes Elsie Stain, MD  Amino Acids-Protein Hydrolys (FEEDING SUPPLEMENT, PRO-STAT SUGAR FREE 64,) LIQD Take 30 mLs by mouth 2 (two) times daily. 06/27/14  Yes Velvet Bathe, MD  Ascorbic Acid (VITAMIN C) 500 MG/5ML LIQD 300 mg by PEG Tube route every morning.    Yes Historical Provider, MD  calcium carbonate, dosed in mg elemental calcium, 1250 MG/5ML by PEG Tube route 3  (three) times daily. 5 cc via peg tube. Give at 8am , 2pm, and 8pm   Yes Historical Provider, MD  cloBAZam (ONFI) 2.5 MG/ML solution Place 2.5 mL in gastrostomy tube 3 times daily 05/17/14  Yes Jodi Geralds, MD  DIASTAT ACUDIAL 20 MG GEL Place 12.5 mg rectally as needed. For seizure lasting 2 minutes or longer or repetitive seizures.  No more than 1 dose every 12 hours or nasal versed (not both). 04/01/14  Yes Historical Provider, MD  dicyclomine (BENTYL) 10 MG/5ML syrup Place 10 mg into feeding tube every 8 (eight) hours as needed. Not to exceed 5 doses in 1 wk as needed for Abd cramps   Yes Historical Provider, MD  diphenoxylate-atropine (LOMOTIL) 2.5-0.025 MG/5ML liquid Take 10 mLs by mouth 4 (four) times daily  as needed for diarrhea or loose stools.   Yes Historical Provider, MD  folic acid (FOLVITE) 1 MG tablet 1 mg by Feeding Tube route daily. 8:00am per G tube   Yes Historical Provider, MD  furosemide (LASIX) 10 MG/ML solution Place 20 mg into feeding tube daily as needed for fluid.    Yes Historical Provider, MD  gabapentin (NEURONTIN) 250 MG/5ML solution Take 18 mL 3 times daily Patient taking differently: Place 900 mg into feeding tube 3 (three) times daily. Give 31mls tid at 8am, 2pm and 8pm 05/01/14  Yes Jodi Geralds, MD  ibuprofen (ADVIL,MOTRIN) 100 MG/5ML suspension Place 400 mg into feeding tube every 6 (six) hours as needed for fever (pain).   Yes Historical Provider, MD  ketoconazole (NIZORAL) 2 % cream Apply 1 application topically 2 (two) times daily as needed for irritation (yeast rash from antibiotics).    Yes Historical Provider, MD  lansoprazole (PREVACID SOLUTAB) 30 MG disintegrating tablet 30 mg by Feeding Tube route 2 (two) times daily. 7:30am and 7:30pm 09/07/12  Yes Lafayette Dragon, MD  metoCLOPramide (REGLAN) 5 MG/5ML solution Place 10 mLs (10 mg total) into feeding tube 4 (four) times daily -  before meals and at bedtime. 07/12/14  Yes Volanda Napoleon, MD  Multiple  Vitamins-Minerals (MULTIVITAMIN) LIQD 10 mLs by Feeding Tube route daily.    Yes Historical Provider, MD  Multiple Vitamins-Minerals (ZINC PO) 5 mLs by PEG Tube route daily. 244 mg / 5 mL zinc solution   Yes Historical Provider, MD  mupirocin ointment (BACTROBAN) 2 % Apply 1 application topically as needed (to G-T site). 09/07/12  Yes Lafayette Dragon, MD  Nutritional Supplements (PROMOD PO) 30 mLs by Feeding Tube route 2 (two) times daily. Give at 10am and 5pm per  g tube   Yes Historical Provider, MD  Nutritional Supplements (TWOCAL HN) LIQD 237 mLs by Feeding Tube route. T.3 can at 46 cc hour x 12 hours - water 172 cc pre and post each and extra 172 bid.   Yes Historical Provider, MD  OVER THE COUNTER MEDICATION Apply 1 application topically as needed (after diaper changes). Balmex and Bag Balm paste made and applied after diaper changes   Yes Historical Provider, MD  OXYGEN-HELIUM IN Inhale into the lungs. Oxygen PRN to keep O2 Sat at 90%   Yes Historical Provider, MD  potassium chloride (KLOR-CON) 20 MEQ packet 20 mEq by Feeding Tube route daily.    Yes Historical Provider, MD  prednisoLONE (ORAPRED) 15 MG/5ML solution Administer 10 mL twice a day for 3 days, then 10 mL daily for 3 days, then 5 mL daily for 3 days. 07/12/14  Yes Volanda Napoleon, MD  Pseudoephedrine HCl (SUDAFED CHILDRENS) 15 MG/5ML LIQD Give 90 mg by tube 3 (three) times daily.    Yes Historical Provider, MD  simethicone (MYLICON) 563 MG chewable tablet 125 mg by Feeding Tube route See admin instructions. 125mg  caplet added to each can of Two Cal   Yes Historical Provider, MD  sodium phosphate (FLEET) enema Place 1 enema rectally daily as needed. follow package directions PRN. Help get pass gas   Yes Historical Provider, MD  sucralfate (CARAFATE) 1 GM/10ML suspension Place 1 g into feeding tube 4 (four) times daily. 7am, 130pm, 5pm, 8pm.  Administer alone 30 minutes prior to other medications.   Yes Historical Provider, MD  Water For  Irrigation, Sterile (FREE WATER) SOLN Place 172 mLs into feeding tube 4 (four) times daily.  Yes Historical Provider, MD  zinc oxide (BALMEX) 11.3 % CREA cream Apply 1 application topically 5 (five) times daily. With each diaper change.   Yes Historical Provider, MD  baclofen (GABLOFEN) 40000 MCG/20ML SOLN 385.2 mcg by Intrathecal route continuous.  05/04/12   Jodi Geralds, MD  midazolam (VERSED) 5 MG/ML injection Place 2 mLs (10 mg total) into the nose once. Draw up 81ml in 2 syringes. Remove blue vial access device. Attach syringe to nasal atomizer for intranasal administration. Give 88ml in right nostril x 2 for seizures lasting 2 minutes or longer or for repetitive seizures in a short period of time. 11/05/13   Rockwell Germany, NP  OnabotulinumtoxinA (BOTOX IJ) Inject 300 Units as directed See admin instructions. Every 3 months    Historical Provider, MD   BP 127/66 mmHg  Pulse 66  Temp(Src) 97.7 F (36.5 C) (Axillary)  Resp 21  Ht 4\' 2"  (1.27 m)  Wt 127 lb 13.9 oz (58 kg)  BMI 35.96 kg/m2  SpO2 100% Physical Exam  HENT:  Head: Normocephalic and atraumatic.  Eyes: Conjunctivae are normal.  Neck: Neck supple.  Cardiovascular: Regular rhythm.   tachycardia  Pulmonary/Chest: Breath sounds normal. He is in respiratory distress.  Diffuse rhonchi  Abdominal: Soft. Bowel sounds are normal. He exhibits no distension. There is no tenderness.  Skin: Skin is warm.  Nursing note and vitals reviewed.   ED Course  Procedures (including critical care time) Labs Review Labs Reviewed  COMPREHENSIVE METABOLIC PANEL - Abnormal; Notable for the following:    Glucose, Bld 122 (*)    BUN 28 (*)    AST 54 (*)    ALT 84 (*)    Alkaline Phosphatase 162 (*)    All other components within normal limits  CBC WITH DIFFERENTIAL/PLATELET - Abnormal; Notable for the following:    WBC 15.7 (*)    RBC 3.10 (*)    Hemoglobin 10.0 (*)    HCT 32.1 (*)    MCV 103.5 (*)    RDW 18.3 (*)    Neutrophils  Relative % 94 (*)    Neutro Abs 14.8 (*)    Lymphocytes Relative 2 (*)    Lymphs Abs 0.3 (*)    All other components within normal limits  URINALYSIS, ROUTINE W REFLEX MICROSCOPIC (NOT AT [facility]) - Abnormal; Notable for the following:    Protein, ur >300 (*)    All other components within normal limits  URINE MICROSCOPIC-ADD ON - Abnormal; Notable for the following:    Crystals CA OXALATE CRYSTALS (*)    All other components within normal limits  LACTIC ACID, PLASMA - Abnormal; Notable for the following:    Lactic Acid, Venous 3.1 (*)    All other components within normal limits  LACTIC ACID, PLASMA - Abnormal; Notable for the following:    Lactic Acid, Venous 3.5 (*)    All other components within normal limits  GLUCOSE, CAPILLARY - Abnormal; Notable for the following:    Glucose-Capillary 100 (*)    All other components within normal limits  LACTIC ACID, PLASMA - Abnormal; Notable for the following:    Lactic Acid, Venous 4.8 (*)    All other components within normal limits  CBC - Abnormal; Notable for the following:    RBC 2.11 (*)    Hemoglobin 6.9 (*)    HCT 22.0 (*)    MCV 104.3 (*)    RDW 18.5 (*)    Platelets 111 (*)    All  other components within normal limits  COMPREHENSIVE METABOLIC PANEL - Abnormal; Notable for the following:    Potassium 3.3 (*)    Calcium 8.7 (*)    Total Protein 5.2 (*)    Albumin 2.8 (*)    AST 42 (*)    Total Bilirubin 1.3 (*)    All other components within normal limits  GLUCOSE, CAPILLARY - Abnormal; Notable for the following:    Glucose-Capillary 101 (*)    All other components within normal limits  GLUCOSE, CAPILLARY - Abnormal; Notable for the following:    Glucose-Capillary 116 (*)    All other components within normal limits  I-STAT CG4 LACTIC ACID, ED - Abnormal; Notable for the following:    Lactic Acid, Venous 4.10 (*)    All other components within normal limits  I-STAT VENOUS BLOOD GAS, ED - Abnormal; Notable for the  following:    pH, Ven 7.373 (*)    pCO2, Ven 52.2 (*)    Bicarbonate 30.4 (*)    Acid-Base Excess 4.0 (*)    All other components within normal limits  I-STAT CG4 LACTIC ACID, ED - Abnormal; Notable for the following:    Lactic Acid, Venous 3.14 (*)    All other components within normal limits  CBG MONITORING, ED - Abnormal; Notable for the following:    Glucose-Capillary 101 (*)    All other components within normal limits  CBG MONITORING, ED - Abnormal; Notable for the following:    Glucose-Capillary 110 (*)    All other components within normal limits  CULTURE, BLOOD (ROUTINE X 2)  CULTURE, BLOOD (ROUTINE X 2)  URINE CULTURE  MRSA PCR SCREENING  RESPIRATORY VIRUS PANEL  CULTURE, EXPECTORATED SPUTUM-ASSESSMENT  GRAM STAIN  PROCALCITONIN  HIV ANTIBODY (ROUTINE TESTING)  STREP PNEUMONIAE URINARY ANTIGEN  GLUCOSE, CAPILLARY  GLUCOSE, CAPILLARY  GLUCOSE, CAPILLARY  GLUCOSE, CAPILLARY  GLUCOSE, CAPILLARY  GLUCOSE, CAPILLARY  OCCULT BLOOD X 1 CARD TO LAB, STOOL  HEMOGLOBIN A1C  LEGIONELLA ANTIGEN, URINE  PROCALCITONIN  CBC  BASIC METABOLIC PANEL  VANCOMYCIN, TROUGH  OCCULT BLOOD X 1 CARD TO LAB, STOOL  OCCULT BLOOD X 1 CARD TO LAB, STOOL  I-STAT TROPOININ, ED  PREPARE RBC (CROSSMATCH)  TYPE AND SCREEN    Imaging Review Dg Chest Port 1 View  07/13/2014   CLINICAL DATA:  Respiratory distress. History of spastic quadriplegia/cerebral palsy.  EXAM: PORTABLE CHEST - 1 VIEW  COMPARISON:  Chest radiograph Jun 19, 2014  FINDINGS: Cardiomediastinal silhouette is unremarkable for this habitus limited examination with low lung volumes. Bandlike density RIGHT lower lobe with elevated RIGHT hemidiaphragm. No pneumothorax. Single lumen RIGHT chest Port-A-Cath with distal tip projecting distal superior vena cava, unchanged. No pneumothorax. Thoracolumbar Harrington rods.  IMPRESSION: RIGHT lower lobe discoid atelectasis.   Electronically Signed   By: Elon Alas M.D.   On:  07/13/2014 06:18     EKG Interpretation None      MDM   Final diagnoses:  Septic shock  Aspiration pneumonia, unspecified aspiration pneumonia type    Pt comes in with fevers, elevated HR, RR. He has lactate of > 4. Concerns for septic shock. BP is fine. Pt has had central fevers and the elevated WC could be due to the meds (and the fevers). However, with coarse breath sounds, high fevers, increased O2 requirement than usual - will treat as septic shock. Broad spectrum antibiotics started.  Pt has a port in place. He is a poor access. Mother reports bieng more comfortable  with picc line over central line. Will start hydration and try to acquire more peripheral access. Will admit to step down icu.  CRITICAL CARE Performed by: Varney Biles   Total critical care time: 35 minutes  Critical care time was exclusive of separately billable procedures and treating other patients.  Critical care was necessary to treat or prevent imminent or life-threatening deterioration.  Critical care was time spent personally by me on the following activities: development of treatment plan with patient and/or surrogate as well as nursing, discussions with consultants, evaluation of patient's response to treatment, examination of patient, obtaining history from patient or surrogate, ordering and performing treatments and interventions, ordering and review of laboratory studies, ordering and review of radiographic studies, pulse oximetry and re-evaluation of patient's condition.    Varney Biles, MD 07/15/14 [phone]

## 2014-07-15 NOTE — Progress Notes (Signed)
Initial Nutrition Assessment  DOCUMENTATION CODES:  Not applicable  INTERVENTION:   Resume home TF of Two Cal HN at 46 ml/hr x 12 hours via J-port of G-J tube for a total of 2.3 cans of Two Cal HN per day.  Start TF at 10 ml/h and slowly increase by 10 ml every 2-4 hours to goal of 46 ml/h. Decrease IVF as TF is increased to goal rate today.   180 ml free water flushes 6 times per day.   Promod 30 ml BID. Can substitute Prostat 30 ml BID if mom does not bring in the Promod.   NUTRITION DIAGNOSIS:  Inadequate oral intake related to inability to eat, chronic illness as evidenced by NPO status.  GOAL:  Patient will meet greater than or equal to 90% of their needs  MONITOR:  TF tolerance, Labs, Weight trends  REASON FOR ASSESSMENT:  Consult Enteral/tube feeding initiation and management  ASSESSMENT: Patient presented to the ED on 6/11 with SOB and desaturation. Hx of Cerebral Palsy. Frequently uses BiPAP at night for rest and OSA. Has a G-J tube for feedings.  Spoke with patient's mom about usual TF and free water regimen; he receives Two Cal HN at 46 ml/hr x 12 hours via J-port of G-J tube for a total of 2.3 cans of Two Cal HN per day. Also recieves Promod liquid protein 30 ml BID and 180 ml free water flushes 6 times per day. This provides ~1300 kcals, 66 gm protein, 1462 ml free water total per day.   Weight has been stable. Mom brings in TF formula from home. She has not resumed usual TF regimen yet; she is waiting for him to get back in his wheel chair before resuming feeding (~1:30 today).   Height:  Ht Readings from Last 1 Encounters:  07/13/14 4\' 2"  (1.27 m)   Weight:  Wt Readings from Last 1 Encounters:  07/14/14 127 lb 13.9 oz (58 kg)    Ideal Body Weight:  46.8 kg  Wt Readings from Last 10 Encounters:  07/14/14 127 lb 13.9 oz (58 kg)  06/27/14 136 lb (61.689 kg)  06/03/14 124 lb (56.246 kg)  05/11/14 122 lb 5.7 oz (55.5 kg)  04/13/14 127 lb (57.607  kg)  04/03/14 129 lb (58.514 kg)  03/29/14 124 lb 6.4 oz (56.427 kg)  03/02/14 134 lb 0.6 oz (60.8 kg)  12/12/13 128 lb (58.06 kg)  04/17/13 124 lb (56.246 kg)    BMI:  Body mass index is 35.96 kg/(m^2).  Estimated Nutritional Needs:  Kcal:  1450-1550  Protein:  65-80 gm  Fluid:  >/= 1.5 L  Skin:  Reviewed, no issues  Diet Order:   NPO  EDUCATION NEEDS:  No education needs identified at this time   Intake/Output Summary (Last 24 hours) at 07/15/14 1028 Last data filed at 07/15/14 0515  Gross per 24 hour  Intake   2220 ml  Output   2476 ml  Net   -256 ml    Last BM:  6/13 (watery)   Molli Barrows, RD, LDN, [place] Pager [phone] After Hours Pager [phone]

## 2014-07-15 NOTE — Progress Notes (Signed)
[place]  [phone]   Patient Care Team: Marijean Bravo, MD as PCP - General (Internal Medicine) Milly Jakob, MD as Consulting Physician (Orthopedic Surgery)  HOSPITAL PROGRESS  NOTE   HPI: 24 year old male with cerebral palsy recently discharged from hospital for fevers, admitted on 6/11 vie ED with shortness of breath and desaturation, likely due to aspiration, acute on chronic respiratory failure. He was febrile. CXR showed possible retrocardiac infiltrate. He has diarrhea as well. Patient is unable to provide history due to acute on chronic illness. He is on BIPAP. He was placed on IV antibiotics, IV diuresis. Patient is DNR.  We were kindly informed of the patient's admission.   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 sodium chloride   Intravenous Once  . ascorbic acid  300 mg Per Tube Q24H  . azithromycin  500 mg Intravenous Q24H  . cloBAZam  2.5 mg Per Tube 3 times per day  . feeding supplement (PRO-STAT SUGAR FREE 64)  30 mL Oral BID  . fluconazole  100 mg Oral Daily  . folic acid  1 mg Per Tube Daily  . gabapentin  900 mg Per Tube 3 times per day  . insulin aspart  0-9 Units Subcutaneous 6 times per day  . ipratropium  0.5 mg Nebulization Q6H  . lansoprazole  30 mg Per J Tube 2 times per day  . levalbuterol  0.63 mg Nebulization Q6H  . metoCLOPramide  10 mg Per Tube TID AC & HS  . midazolam  10 mg Nasal Once  . piperacillin-tazobactam (ZOSYN)  IV  3.375 g Intravenous 3 times per day  . prednisoLONE  30 mg Per Tube BID   Followed by  . [START ON 07/16/2014] prednisoLONE  30 mg Oral QAC breakfast   Followed by  . [START ON 07/19/2014] prednisoLONE  5.1 mg Oral QAC breakfast  . pseudoephedrine  90 mg Per Tube 3 times per day  . sodium chloride  3  mL Intravenous Q12H  . sucralfate  1 g Per Tube 4 times per day  . vancomycin  750 mg Intravenous Q12H  . zinc oxide  1 application Topical 5 X Daily   Continuous Infusions: . dextrose 5% lactated ringers 100 mL/hr at 07/15/14 0713   PRN Meds:.acetaminophen (TYLENOL) oral liquid 160 mg/5 mL, albuterol, dicyclomine, diphenoxylate-atropine, heparin lock flush, ketoconazole, sodium chloride   ALLERGIES:   Allergies  Allergen Reactions  . Depakote [Divalproex Sodium]     Causes pancreatitis   . Vimpat [Lacosamide] Rash  . Keppra [Levetiracetam] Other (See Comments)    Bone marrow suppression  . Adhesive [Tape]     Rips skin off  . Amoxicillin-Pot Clavulanate Rash    Give w fluconazole then no rash  . Neulasta [Pegfilgrastim] Palpitations     PHYSICAL EXAMINATION:  Filed Vitals:   07/15/14 0926  BP:   Pulse: 89  Temp:   Resp: 24   Filed Weights   07/13/14 1546 07/14/14 0528  Weight: 123 lb 7.3 oz (56 kg) 127 lb 13.9 oz (58 kg)    General: chronically ill appearing, contracted.  Lungs: diffuse, coarse breath sounds bilaterally CV: regular. Right port a cath normal Abdomen: appears soft, non tender, positive bowel sounds. He has tube feeding  Extremities without edema Neuro: patient is non verbal, unable to follow  commands, contracted.  LABORATORY/RADIOLOGY DATA:   Recent Labs Lab 07/13/14 0625 07/14/14 1030 07/15/14 0600  WBC 15.7* 6.5 3.3*  HGB 10.0* 6.9* 10.1*  HCT 32.1* 22.0* 31.8*  PLT 168 111* 112*  MCV 103.5* 104.3* 96.4  MCH 32.3 32.7 30.6  MCHC 31.2 31.4 31.8  RDW 18.3* 18.5* 19.0*  LYMPHSABS 0.3*  --   --   MONOABS 0.6  --   --   EOSABS 0.0  --   --   BASOSABS 0.0  --   --     CMP    Recent Labs Lab 07/13/14 0625 07/14/14 1030 07/15/14 0600  NA 144 144 144  K 3.9 3.3* 4.3  CL 101 105 105  CO2 29 29 29   GLUCOSE 122* 90 133*  BUN 28* 13 12  CREATININE 0.80 0.71 0.77  CALCIUM 9.6 8.7* 9.1  AST 54* 42*  --   ALT 84* 59  --     ALKPHOS 162* 112  --   BILITOT 0.7 1.3*  --         Component Value Date/Time   BILITOT 1.3* 07/14/2014 1030   BILITOT 1.00 07/03/2014 1301         Component Value Date/Time   ESRSEDRATE 40* 06/23/2014 1355    No results for input(s): INR, PROTIME in the last 168 hours.    Urinalysis    Component Value Date/Time   COLORURINE YELLOW 07/13/2014 0700   APPEARANCEUR CLEAR 07/13/2014 0700   LABSPEC 1.017 07/13/2014 0700   PHURINE 6.0 07/13/2014 0700   GLUCOSEU NEGATIVE 07/13/2014 0700   HGBUR NEGATIVE 07/13/2014 0700   BILIRUBINUR NEGATIVE 07/13/2014 0700   KETONESUR NEGATIVE 07/13/2014 0700   PROTEINUR >300* 07/13/2014 0700   UROBILINOGEN 0.2 07/13/2014 0700   NITRITE NEGATIVE 07/13/2014 0700   LEUKOCYTESUR NEGATIVE 07/13/2014 0700    Liver Function Tests:  Recent Labs Lab 07/13/14 0625 07/14/14 1030  AST 54* 42*  ALT 84* 59  ALKPHOS 162* 112  BILITOT 0.7 1.3*  PROT 7.0 5.2*  ALBUMIN 3.6 2.8*   No results for input(s): LIPASE, AMYLASE in the last 168 hours. No results for input(s): AMMONIA in the last 168 hours.  CBG:  Recent Labs Lab 07/14/14 1120 07/14/14 1450 07/14/14 1949 07/15/14 0021 07/15/14 0511  GLUCAP 89 85 101* 116* 149*    Dg Chest Port 1 View  07/13/2014   CLINICAL DATA:  Respiratory distress. History of spastic quadriplegia/cerebral palsy.  EXAM: PORTABLE CHEST - 1 VIEW  COMPARISON:  Chest radiograph Jun 19, 2014  FINDINGS: Cardiomediastinal silhouette is unremarkable for this habitus limited examination with low lung volumes. Bandlike density RIGHT lower lobe with elevated RIGHT hemidiaphragm. No pneumothorax. Single lumen RIGHT chest Port-A-Cath with distal tip projecting distal superior vena cava, unchanged. No pneumothorax. Thoracolumbar Harrington rods.  IMPRESSION: RIGHT lower lobe discoid atelectasis.   Electronically Signed   By: Elon Alas M.D.   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  Last dose on 6/10, he is on steroids as well Cultures pending WBC today is 3.3, need to check differential.  Anemia Multifactorial  Hb on admission was 6.9, requiring 2 units blood, now at 10.1 Monitor counts closely  Thrombocytopenia This is chronic No bleeding issues are noted Monitor counts closely  DNR  Other medical issues  as per admitting team   [facility] E, PA-C 07/15/2014, 9:48 AM  ADDENDUM:  I saw and examined the patient this morning.  I am puzzled as to why he is having aspiration now.  He has Klebsiella in his urine.  He is on IV antibiotics.  He actually looks pretty good this morning. He seems to be fairly alert. He has not had any temperatures. His white cell count is 2.1. We'll have to go ahead and give him a dose of Neupogen.  I think that a time he gets an infection, it actually slows down his bone marrow and he tends to exacerbate his cytopenias. He was transfused with 2 units of blood.  His mom really wants to get him home today. I think this would be very reasonable. I would imagine that the Klebsiella is fairly sensitive to antibiotics and hopefully to antibiotics I can be put through his G-tube.  He is afebrile. His blood pressure is 108/67. His pulse is about 100. His lungs still show some crackles. He has pretty good air movement bilaterally. Cardiac exam is slightly tachycardic but regular. Abdomen is soft. Bowel sounds are present.  Again, I don't see any problem with him trying to go home. We will give him his dose of Neupogen today.  I probably will get him into the office in about 3 days or so for follow-up and we can recheck his CBC.  [name]  Proverbs 17:17

## 2014-07-15 NOTE — Progress Notes (Signed)
Antibiotic CONSULT NOTE   Pharmacy Consult for Vancomycin Indication: PNA, Sepsis  Allergies  Allergen Reactions  . Depakote [Divalproex Sodium]     Causes pancreatitis   . Vimpat [Lacosamide] Rash  . Keppra [Levetiracetam] Other (See Comments)    Bone marrow suppression  . Adhesive [Tape]     Rips skin off  . Amoxicillin-Pot Clavulanate Rash    Give w fluconazole then no rash  . Neulasta [Pegfilgrastim] Palpitations    Patient Measurements: Height: 4\' 2"  (127 cm) Weight: 127 lb 13.9 oz (58 kg) IBW/kg (Calculated) : 27 Heparin Dosing Weight:    Vital Signs: Temp: 97.7 F (36.5 C) (06/13 0320) Temp Source: Axillary (06/13 0320) BP: 127/66 mmHg (06/13 0320) Pulse Rate: 73 (06/13 0402)  Labs:  Recent Labs  07/13/14 0625 07/14/14 1030 07/15/14 0600  HGB 10.0* 6.9*  --   HCT 32.1* 22.0*  --   PLT 168 111*  --   CREATININE 0.80 0.71 0.77    Estimated Creatinine Clearance: 80 mL/min (by C-G formula based on Cr of 0.77).   Assessment: 24 y/o quadriplegic male with PNA/sepsis for empiric antibiotics.  Vancomycin trough this morning elevated  Goal of Therapy:  Vanco trough 15-20  Plan:  Change Vancomycin 750mg  IV q12hr   Jeffery [name] 07/15/2014,7:03 AM

## 2014-07-15 NOTE — Progress Notes (Signed)
[facility]  Patient Status: Active (receiving services up to time of hospitalization)  [name] is providing the following services: RN  If patient discharges after hours, please call [phone].   Consepcion Hearing 07/15/2014, 4:55 PM

## 2014-07-15 NOTE — Progress Notes (Signed)
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 Marin Olp for pancytopenia and his WBC count was down so pt was given Neupogen and prednisone. He was brought to ED with concern for respiratory distress per patient's mother report. Pt is not good historian due to history of CP  Patient admitted with Sepsis, Acute on chronic hypoxic respiratory failure. CCM consulted due to worsening Respiratory failure. Patient is now DNR/DNI.   1-Sepsis:  Likely from aspiration PNA.  Sepsis criteria met on admission with hypotension, tachycardia, tachypnea, fever, leukocytosis, lactic acidosis. Source of infection thought to be due to aspiration pneumonia. Lactic acid at 4.  Blood culture no growth to date.  Urine culture growing 50,000 gram negative rods.  Continue with Vancomycin and Zosyn.   2-Dysphagia requring chronic J tube feeds Patient has a jejunostomy tube and therefore must be on a specific tube feeding which is not available here at the hospital (2 cal ATN)-family to bring tube feeding from home. Will try to start nutrition. Mother ask for him to be in chair while getting tube feedings.   Acute on chronic respiratory failure with hypoxia/? Reurrent aspiration pneumonia nebulizer treatment.  Evaluated by CMM. Dr Nelda Marseille discussed with patient ' s mother Code status. Patient is now DNR/DNI.  BIPAP as needed.    Generalized convulsive epilepsy Had been relatively quiescent for this patient until recently noting that he has been experiencing several grand mal seizures over the past 2 weeks; mother states she has made  pediatric neurologist Dr. Gaynell Face aware -No seizure activity seen since admission -Continue preadmission dose of ONFI -Patient has at home intranasal versed and Valium to use for breakthrough seizures. Ordered placed.  -Continue preadmission baclofen pump  OSA (obstructive sleep apnea) -Continue our of sleep CPAP   Acquired pancytopenia -Has received Neupogen through oncology office with improvement in platelets and white count -Continue prednisone taper as prior to admission -Dr Jonette Eva consulted.    Hyperglycemia, drug-induced -Suspect related to prednisolone  -All his CBGs every 4 hours and provide SSI if indicated   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, quadriplegic -Wheelchair bound and nonverbal at baseline   Anemia of chronic disease -Hemoglobin at baseline of between 9 and 11.  -Hb drop to 6 . He received 2 units PRBC. -Hb today at 10 after 2 units of blood.   Code Status: DNR/DNI  Family Communication: care discussed with mother.  Disposition Plan: Remain in the step down.    Consultants:  none  Procedures:  none  Antibiotics:  Vancomycin 6-11  Cefepime 6-11  HPI/Subjective: Patient open eyes, appears less lethargic.  He is on BIPAP/  Develops diarrhea overnight. Mon think this is from vancomycin.    Objective: Filed Vitals:   07/15/14 1225  BP: 131/79  Pulse: 95  Temp: 98 F (36.7 C)  Resp: 19    Intake/Output Summary (Last 24 hours) at 07/15/14 1407 Last data filed at 07/15/14 1200  Gross per 24 hour  Intake   1560 ml  Output   2351 ml  Net   -791 ml   Filed Weights   07/13/14 1546 07/14/14 0528  Weight: 56 kg (123 lb 7.3 oz) 58 kg (127 lb  13.9 oz)    Exam:   General:  Open eyes, move head side to side, on BIPAP  Cardiovascular: S 1, S 2 RRR, tachycardic  Respiratory: Ronchus left  Abdomen: BS present, soft, nt  Musculoskeletal: no edema  Data Reviewed: Basic Metabolic  Panel:  Recent Labs Lab 07/13/14 0625 07/14/14 1030 07/15/14 0600  NA 144 144 144  K 3.9 3.3* 4.3  CL 101 105 105  CO2 29 29 29   GLUCOSE 122* 90 133*  BUN 28* 13 12  CREATININE 0.80 0.71 0.77  CALCIUM 9.6 8.7* 9.1   Liver Function Tests:  Recent Labs Lab 07/13/14 0625 07/14/14 1030  AST 54* 42*  ALT 84* 59  ALKPHOS 162* 112  BILITOT 0.7 1.3*  PROT 7.0 5.2*  ALBUMIN 3.6 2.8*   No results for input(s): LIPASE, AMYLASE in the last 168 hours. No results for input(s): AMMONIA in the last 168 hours. CBC:  Recent Labs Lab 07/13/14 0625 07/14/14 1030 07/15/14 0600  WBC 15.7* 6.5 3.3*  NEUTROABS 14.8*  --  3.0  HGB 10.0* 6.9* 10.1*  HCT 32.1* 22.0* 31.8*  MCV 103.5* 104.3* 96.4  PLT 168 111* 112*   Cardiac Enzymes: No results for input(s): CKTOTAL, CKMB, CKMBINDEX, TROPONINI in the last 168 hours. BNP (last 3 results) No results for input(s): BNP in the last 8760 hours.  ProBNP (last 3 results) No results for input(s): PROBNP in the last 8760 hours.  CBG:  Recent Labs Lab 07/14/14 1949 07/15/14 0021 07/15/14 0511 07/15/14 0851 07/15/14 1212  GLUCAP 101* 116* 149* 137* 115*    Recent Results (from the past 240 hour(s))  Blood Culture (routine x 2)     Status: None (Preliminary result)   Collection Time: 07/13/14  6:20 AM  Result Value Ref Range Status   Specimen Description BLOOD LEFT HAND  Final   Special Requests BOTTLES DRAWN AEROBIC ONLY 3CC  Final   Culture   Final           BLOOD CULTURE RECEIVED NO GROWTH TO DATE CULTURE WILL BE HELD FOR 5 DAYS BEFORE ISSUING A FINAL NEGATIVE REPORT Performed at [facility]    Report Status PENDING  Incomplete  Blood Culture (routine x 2)     Status: None (Preliminary result)   Collection Time: 07/13/14  6:25 AM  Result Value Ref Range Status   Specimen Description BLOOD RIGHT ARM  Final   Special Requests BOTTLES DRAWN AEROBIC AND ANAEROBIC 10CC  Final   Culture   Final           BLOOD CULTURE  RECEIVED NO GROWTH TO DATE CULTURE WILL BE HELD FOR 5 DAYS BEFORE ISSUING A FINAL NEGATIVE REPORT Performed at [facility]    Report Status PENDING  Incomplete  Urine culture     Status: None (Preliminary result)   Collection Time: 07/13/14  7:00 AM  Result Value Ref Range Status   Specimen Description URINE, CLEAN CATCH  Final   Special Requests NONE  Final   Colony Count   Final    50,000 COLONIES/ML Performed at [facility]    Culture   Final    GRAM NEGATIVE RODS Performed at [facility]    Report Status PENDING  Incomplete  MRSA PCR Screening     Status: None   Collection Time: 07/13/14  4:05 PM  Result Value Ref Range Status   MRSA by PCR NEGATIVE NEGATIVE Final    Comment:  The GeneXpert MRSA Assay (FDA approved for NASAL specimens only), is one component of a comprehensive MRSA colonization surveillance program. It is not intended to diagnose MRSA infection nor to guide or monitor treatment for MRSA infections.   Clostridium Difficile by PCR (not at [facility])     Status: None   Collection Time: 07/15/14  7:06 AM  Result Value Ref Range Status   C difficile by pcr NEGATIVE NEGATIVE Final     Studies: No results found.  Scheduled Meds: . sodium chloride   Intravenous Once  . ascorbic acid  300 mg Per Tube Q24H  . azithromycin  500 mg Intravenous Q24H  . cloBAZam  2.5 mg Per Tube 3 times per day  . feeding supplement (PRO-STAT SUGAR FREE 64)  30 mL Per Tube BID  . fluconazole  100 mg Oral Daily  . folic acid  1 mg Per Tube Daily  . free water  180 mL Per Tube 3 times per day  . gabapentin  900 mg Per Tube 3 times per day  . insulin aspart  0-9 Units Subcutaneous 6 times per day  . ipratropium  0.5 mg Nebulization Q6H  . lansoprazole  30 mg Per J Tube 2 times per day  . levalbuterol  0.63 mg Nebulization Q6H  . metoCLOPramide  10 mg Per Tube TID AC & HS  . midazolam  10 mg Nasal Once  . piperacillin-tazobactam (ZOSYN)  IV   3.375 g Intravenous 3 times per day  . prednisoLONE  30 mg Per Tube BID   Followed by  . [START ON 07/16/2014] prednisoLONE  30 mg Oral QAC breakfast   Followed by  . [START ON 07/19/2014] prednisoLONE  5.1 mg Oral QAC breakfast  . pseudoephedrine  90 mg Per Tube 3 times per day  . sodium chloride  3 mL Intravenous Q12H  . sucralfate  1 g Per Tube 4 times per day  . vancomycin  750 mg Intravenous Q12H  . zinc oxide  1 application Topical 5 X Daily   Continuous Infusions: . dextrose 5% lactated ringers 100 mL/hr at 07/15/14 0713  . TWOCAL HN      Principal Problem:   Sepsis due to pneumonia Active Problems:   History of prematurity with neonatal intraventricular hemorrhage   Dysphagia requring chronic J tube feeds   Generalized convulsive epilepsy   Aspiration pneumonia   Acute on chronic respiratory failure with hypoxia   OSA (obstructive sleep apnea)   Cerebral palsy, quadriplegic   Acquired pancytopenia   Anemia of chronic disease   Elevated lactic acid level   Fever   Hyperglycemia, drug-induced    Time spent: 35 minutes.     Niel Hummer A  [facility] Pager [phone]. If 7PM-7AM, please contact night-coverage at [email or web address], password [facility] 07/15/2014, 2:07 PM  LOS: 2 days

## 2014-07-15 NOTE — Progress Notes (Signed)
Physician notified: [place] At: 59  Regarding: OK to add-on diff to AM labs, or do you want new blood draw? Thanks Awaiting return response.   Returned Response at: 1054  Order(s): OK to add on to AM labs.

## 2014-07-16 ENCOUNTER — Ambulatory Visit: Payer: Medicaid Other | Admitting: Hematology & Oncology

## 2014-07-16 ENCOUNTER — Other Ambulatory Visit: Payer: Medicaid Other

## 2014-07-16 ENCOUNTER — Ambulatory Visit: Payer: Medicaid Other

## 2014-07-16 DIAGNOSIS — J9620 Acute and chronic respiratory failure, unspecified whether with hypoxia or hypercapnia: Secondary | ICD-10-CM

## 2014-07-16 DIAGNOSIS — D649 Anemia, unspecified: Secondary | ICD-10-CM

## 2014-07-16 DIAGNOSIS — G808 Other cerebral palsy: Secondary | ICD-10-CM

## 2014-07-16 DIAGNOSIS — I6990 Unspecified sequelae of unspecified cerebrovascular disease: Secondary | ICD-10-CM

## 2014-07-16 DIAGNOSIS — D61818 Other pancytopenia: Secondary | ICD-10-CM

## 2014-07-16 DIAGNOSIS — D696 Thrombocytopenia, unspecified: Secondary | ICD-10-CM

## 2014-07-16 DIAGNOSIS — R1310 Dysphagia, unspecified: Secondary | ICD-10-CM

## 2014-07-16 LAB — TYPE AND SCREEN
ABO/RH(D): O POS
Antibody Screen: NEGATIVE
Unit division: 0
Unit division: 0

## 2014-07-16 LAB — BASIC METABOLIC PANEL
Anion gap: 11 (ref 5–15)
BUN: 13 mg/dL (ref 6–20)
CALCIUM: 9 mg/dL (ref 8.9–10.3)
CHLORIDE: 104 mmol/L (ref 101–111)
CO2: 29 mmol/L (ref 22–32)
CREATININE: 0.95 mg/dL (ref 0.61–1.24)
GFR calc Af Amer: >60 mL/min (ref >60)
GFR calc non Af Amer: >60 mL/min (ref >60)
Glucose, Bld: 143 mg/dL — ABNORMAL HIGH (ref 65–99)
Potassium: 3.2 mmol/L — ABNORMAL LOW (ref 3.5–5.1)
Sodium: 144 mmol/L (ref 135–145)

## 2014-07-16 LAB — CBC WITH DIFFERENTIAL/PLATELET
BASOS ABS: 0 10*3/uL (ref 0.0–0.1)
Basophils Relative: 0 % (ref 0–1)
Eosinophils Absolute: 0 10*3/uL (ref 0.0–0.7)
Eosinophils Relative: 0 % (ref 0–5)
HCT: 32.4 % — ABNORMAL LOW (ref 39.0–52.0)
Hemoglobin: 10.4 g/dL — ABNORMAL LOW (ref 13.0–17.0)
LYMPHS ABS: 0.2 10*3/uL — AB (ref 0.7–4.0)
Lymphocytes Relative: 12 % (ref 12–46)
MCH: 31.2 pg (ref 26.0–34.0)
MCHC: 32.1 g/dL (ref 30.0–36.0)
MCV: 97.3 fL (ref 78.0–100.0)
Monocytes Absolute: 0.3 10*3/uL (ref 0.1–1.0)
Monocytes Relative: 17 % — ABNORMAL HIGH (ref 3–12)
NEUTROS ABS: 1.5 10*3/uL — AB (ref 1.7–7.7)
NEUTROS PCT: 71 % (ref 43–77)
PLATELETS: 134 10*3/uL — AB (ref 150–400)
RBC: 3.33 MIL/uL — ABNORMAL LOW (ref 4.22–5.81)
RDW: 19 % — ABNORMAL HIGH (ref 11.5–15.5)
WBC: 2.1 10*3/uL — AB (ref 4.0–10.5)

## 2014-07-16 LAB — GLUCOSE, CAPILLARY
GLUCOSE-CAPILLARY: 143 mg/dL — AB (ref 65–99)
GLUCOSE-CAPILLARY: 94 mg/dL (ref 65–99)
GLUCOSE-CAPILLARY: 121 mg/dL — AB (ref 65–99)
Glucose-Capillary: 122 mg/dL — ABNORMAL HIGH (ref 65–99)

## 2014-07-16 LAB — RESPIRATORY VIRUS PANEL
ADENOVIRUS: NEGATIVE
INFLUENZA B 1: NEGATIVE
Influenza A: NEGATIVE
Metapneumovirus: NEGATIVE
PARAINFLUENZA 2 A: NEGATIVE
PARAINFLUENZA 3 A: NEGATIVE
Parainfluenza 1: NEGATIVE
Respiratory Syncytial Virus A: NEGATIVE
Respiratory Syncytial Virus B: NEGATIVE
Rhinovirus: NEGATIVE

## 2014-07-16 MED ORDER — LEVOFLOXACIN 500 MG PO TABS: 500.0000 mg | ORAL_TABLET | Freq: Every day | ORAL | Status: DC

## 2014-07-16 MED ORDER — LEVOFLOXACIN 25 MG/ML PO SOLN
500.0000 mg | Freq: Every day | ORAL | Status: AC
Start: 2014-07-16 — End: 2014-07-19

## 2014-07-16 MED ORDER — HEPARIN SOD (PORK) LOCK FLUSH 100 UNIT/ML IV SOLN
500.0000 [IU] | INTRAVENOUS | Status: AC | PRN
  Administered 2014-07-16: 500 [IU]

## 2014-07-16 MED ORDER — IPRATROPIUM BROMIDE 0.02 % IN SOLN
0.5000 mg | Freq: Three times a day (TID) | RESPIRATORY_TRACT | Status: DC
Start: 2014-07-16 — End: 2014-07-16
  Administered 2014-07-16: 0.5 mg via RESPIRATORY_TRACT
  Filled 2014-07-16 (×2): qty 2.5

## 2014-07-16 MED ORDER — LEVALBUTEROL HCL 0.63 MG/3ML IN NEBU
0.6300 mg | INHALATION_SOLUTION | Freq: Three times a day (TID) | RESPIRATORY_TRACT | Status: DC
  Administered 2014-07-16: 0.63 mg via RESPIRATORY_TRACT
  Filled 2014-07-16 (×3): qty 3

## 2014-07-16 MED ORDER — FILGRASTIM 480 MCG/1.6ML IJ SOLN
480.0000 ug | Freq: Once | INTRAMUSCULAR | Status: AC
  Administered 2014-07-16: 480 ug via SUBCUTANEOUS
  Filled 2014-07-16: qty 1.6

## 2014-07-16 NOTE — Progress Notes (Signed)
Pt active with [facility]), will need  resumption  order for [facility] upon discharge.

## 2014-07-16 NOTE — Progress Notes (Signed)
Pt caregiver given discharge packet and education given. Vitals stable. Home medications given back from pharmacy. Verified prescription was received by [facility] pharmacy. Will continue to monitor.

## 2014-07-16 NOTE — Discharge Summary (Signed)
Discharge Summary  Jeffery [name] ZOX:[id number] DOB: 01-Feb-1991  PCP: Marijean Bravo, MD  Admit date: 07/13/2014 Discharge date: 07/16/2014  Time spent: 35 minutes  Recommendations for Outpatient Follow-up:  New medication: Levaquin 500 mg daily 4 days   Discharge Diagnoses:  Active Hospital Problems   Diagnosis Date Noted  . Sepsis due to pneumonia 07/13/2014  . Anemia of chronic disease 07/13/2014  . Elevated lactic acid level 07/13/2014  . Fever 07/13/2014  . Hyperglycemia, drug-induced 07/13/2014  . Acquired pancytopenia   . Cerebral palsy, quadriplegic   . OSA (obstructive sleep apnea)   . Acute on chronic respiratory failure with hypoxia   . Aspiration pneumonia 11/25/2012  . Generalized convulsive epilepsy   . Dysphagia requring chronic J tube feeds   . History of prematurity with neonatal intraventricular hemorrhage 04/26/2012    Resolved Hospital Problems   Diagnosis Date Noted Date Resolved  No resolved problems to display.    Discharge Condition: Improved, being discharged home  Diet recommendation: TwoCal tube feeds plus pro-stat feeding supplement  Filed Weights   07/13/14 1546 07/14/14 0528  Weight: 56 kg (123 lb 7.3 oz) 58 kg (127 lb 13.9 oz)    History of present illnpatient is a 24 year old male past medical history cerebral palsy, neonatal intraventricular hemorrhage and congenital spastic quadriplegia hospitalized last month for sepsis from unspecified organism complicated by issues with pancytopenia, possibly from medication admitted on 6/11 for acute on chronic hypoxic respiratory failure and secondary sepsis.  ospital Course:  Principal Problem:   Sepsis due to aspiration pneumonia causing acute on chronic respiratory failure with hypoxia: Patient criteria given elevated lactic acid level, hypotension, tachycardia fever marked leukocytosis and source felt to be aspiration pneumonia. He was started on broad-spectrum anti-biotics.  Urine  cultures grew out 50,000 colonies of nearly pansensitive pseudomonas. By 6/14, patient much improved felt to be at baseline. Afebrile. Reports some loose stooling but C. difficile culture checked on C/13 was negative. No further fevers. White count normalized. And about exchange over to by mouth Levaquin which could be given through his tube for 4 more days    Dysphagia requring chronic J tube feeds: Once stabilized, he was resumed on his tube feedings.   Generalized convulsive epilepsy: Overall this is been a stable issue although in the past few weeks patient has been experiencing several grand mal seizures. Patient's neurologist aware. No seizure activity during this admission. He will continue preadmission medications as well as when necessary intranasal Versed and Valium for breakthrough seizures. Patient was discharged on by mouth Levaquin and I discussed with his mother at that this could potentially lower his seizure threshold although this would only be for a few days. She monitors him very closely and was amenable to this plan    OSA (obstructive sleep apnea): Continue CPap   Cerebral palsy, quadriplegic: At baseline.    Acquired pancytopenia: Has been receiving Neupogen through oncology office. Prednisone taper was continued during hospitalization. Dr. Marin Olp follow patient during this hospitalization.    Anemia, multifactorial secondary to pancytopenia and chronic disease. Hemoglobin on admission was 6.9. No signs of acute blood loss. Received 2 units of blood    Hyperglycemia, drug-induced: Secondary to prednisone. CBGs monitored closely   Consultants: None  Procedures: None  Discharge Exam: BP 99/63 mmHg  Pulse 119  Temp(Src) 98.6 F (37 C) (Axillary)  Resp 24  Ht 4' 2"  (1.27 m)  Wt 58 kg (127 lb 13.9 oz)  BMI 35.96 kg/m2  SpO2 95%  General: alert and oriented Cardia iovascular: Regular rhythm, borderline tachycardia  Respiratory: overall clear   Discharge  Instructions You were cared for by a hospitalist during your hospital stay. If you have any questions about your discharge medications or the care you received while you were in the hospital after you are discharged, you can call the unit and asked to speak with the hospitalist on call if the hospitalist that took care of you is not available. Once you are discharged, your primary care physician will handle any further medical issues. Please note that NO REFILLS for any discharge medications will be authorized once you are discharged, as it is imperative that you return to your primary care physician (or establish a relationship with a primary care physician if you do not have one) for your aftercare needs so that they can reassess your need for medications and monitor your lab values.     Medication List    TAKE these medications        albuterol (2.5 MG/3ML) 0.083% nebulizer solution  Commonly known as:  PROVENTIL  Take 2.5 mg by nebulization 2 (two) times daily as needed. 1 ampule twice daily and as needed (making 4 doses daily). Take a 8am and 8pm and then prn 6 and 7 are not used at the same time     albuterol 108 (90 BASE) MCG/ACT inhaler  Commonly known as:  PROVENTIL HFA;VENTOLIN HFA  Inhale 2 puffs into the lungs every 4 (four) hours as needed for shortness of breath.     baclofen 33295 MCG/20ML Soln  Commonly known as:  GABLOFEN  385.2 mcg by Intrathecal route continuous.     BALMEX 11.3 % Crea cream  Generic drug:  zinc oxide  Apply 1 application topically 5 (five) times daily. With each diaper change.     BOTOX IJ  Inject 300 Units as directed See admin instructions. Every 3 months     calcium carbonate (dosed in mg elemental calcium) 1250 (500 CA) MG/5ML  by PEG Tube route 3 (three) times daily. 5 cc via peg tube. Give at 8am , 2pm, and 8pm     cloBAZam 2.5 MG/ML solution  Commonly known as:  ONFI  Place 2.5 mL in gastrostomy tube 3 times daily     DIASTAT ACUDIAL 20  MG Gel  Generic drug:  diazepam  Place 12.5 mg rectally as needed. For seizure lasting 2 minutes or longer or repetitive seizures.  No more than 1 dose every 12 hours or nasal versed (not both).     dicyclomine 10 MG/5ML syrup  Commonly known as:  BENTYL  Place 10 mg into feeding tube every 8 (eight) hours as needed. Not to exceed 5 doses in 1 wk as needed for Abd cramps     diphenoxylate-atropine 2.5-0.025 MG/5ML liquid  Commonly known as:  LOMOTIL  Take 10 mLs by mouth 4 (four) times daily as needed for diarrhea or loose stools.     feeding supplement (PRO-STAT SUGAR FREE 64) Liqd  Take 30 mLs by mouth 2 (two) times daily.     folic acid 1 MG tablet  Commonly known as:  FOLVITE  1 mg by Feeding Tube route daily. 8:00am per G tube     free water Soln  Place 172 mLs into feeding tube 4 (four) times daily.     furosemide 10 MG/ML solution  Commonly known as:  LASIX  Place 20 mg into feeding tube daily as needed for fluid.  gabapentin 250 MG/5ML solution  Commonly known as:  NEURONTIN  Take 18 mL 3 times daily     ibuprofen 100 MG/5ML suspension  Commonly known as:  ADVIL,MOTRIN  Place 400 mg into feeding tube every 6 (six) hours as needed for fever (pain).     ketoconazole 2 % cream  Commonly known as:  NIZORAL  Apply 1 application topically 2 (two) times daily as needed for irritation (yeast rash from antibiotics).     lansoprazole 30 MG disintegrating tablet  Commonly known as:  PREVACID SOLUTAB  30 mg by Feeding Tube route 2 (two) times daily. 7:30am and 7:30pm     levofloxacin 25 MG/ML solution  Commonly known as:  LEVAQUIN  Take 20 mLs (500 mg total) by mouth daily.     metoCLOPramide 5 MG/5ML solution  Commonly known as:  REGLAN  Place 10 mLs (10 mg total) into feeding tube 4 (four) times daily -  before meals and at bedtime.     midazolam 5 MG/ML injection  Commonly known as:  VERSED  Place 2 mLs (10 mg total) into the nose once. Draw up 26m in 2 syringes.  Remove blue vial access device. Attach syringe to nasal atomizer for intranasal administration. Give 159min right nostril x 2 for seizures lasting 2 minutes or longer or for repetitive seizures in a short period of time.     MULTIVITAMIN Liqd  10 mLs by Feeding Tube route daily.     mupirocin ointment 2 %  Commonly known as:  BACTROBAN  Apply 1 application topically as needed (to G-T site).     OVER THE COUNTER MEDICATION  Apply 1 application topically as needed (after diaper changes). Balmex and Bag Balm paste made and applied after diaper changes     OXYGEN  Inhale into the lungs. Oxygen PRN to keep O2 Sat at 90%     potassium chloride 20 MEQ packet  Commonly known as:  KLOR-CON  20 mEq by Feeding Tube route daily.     prednisoLONE 15 MG/5ML solution  Commonly known as:  ORAPRED  Administer 10 mL twice a day for 3 days, then 10 mL daily for 3 days, then 5 mL daily for 3 days.     simethicone 125 MG chewable tablet  Commonly known as:  MYLICON  12478g by Feeding Tube route See admin instructions. 12574maplet added to each can of Two Cal     sodium phosphate enema  Commonly known as:  FLEET  Place 1 enema rectally daily as needed. follow package directions PRN. Help get pass gas     sucralfate 1 GM/10ML suspension  Commonly known as:  CARAFATE  Place 1 g into feeding tube 4 (four) times daily. 7am, 130pm, 5pm, 8pm.  Administer alone 30 minutes prior to other medications.     SUDAFED CHILDRENS 15 MG/5ML Liqd  Generic drug:  Pseudoephedrine HCl  Give 90 mg by tube 3 (three) times daily.     TWOCAL HN Liqd  237 mLs by Feeding Tube route. T.3 can at 46 cc hour x 12 hours - water 172 cc pre and post each and extra 172 bid.     PROMOD PO  30 mLs by Feeding Tube route 2 (two) times daily. Give at 10am and 5pm per  g tube     TYLENOL PO  Place 500 mg into feeding tube every 6 (six) hours as needed (fever).     Vitamin C 500 MG/5ML Liqd  300  mg by PEG Tube route every  morning.     ZINC PO  5 mLs by PEG Tube route daily. 244 mg / 5 mL zinc solution       Allergies  Allergen Reactions  . Depakote [Divalproex Sodium]     Causes pancreatitis   . Vimpat [Lacosamide] Rash  . Keppra [Levetiracetam] Other (See Comments)    Bone marrow suppression  . Adhesive [Tape]     Rips skin off  . Amoxicillin-Pot Clavulanate Rash    Give w fluconazole then no rash  . Neulasta [Pegfilgrastim] Palpitations      The results of significant diagnostics from this hospitalization (including imaging, microbiology, ancillary and laboratory) are listed below for reference.    Significant Diagnostic Studies: Ct Biopsy  06/26/2014   CLINICAL DATA:  Pancytopenia.  Need for bone marrow biopsy.  EXAM: CT GUIDED ASPIRATE AND CORE BONE MARROW BIOPSY OF RIGHT ILIAC BONE  ANESTHESIA/SEDATION: No conscious sedation was administered.  PROCEDURE: The procedure risks, benefits, and alternatives were explained to the patient's mother. Questions regarding the procedure were encouraged and answered. The patient's mother understands and consents to the procedure.  The right gluteal region was prepped with Betadine. Sterile gown and sterile gloves were used for the procedure. Local anesthesia was provided with 1% Lidocaine.  Under CT guidance, an 11 gauge OnControl bone cutting needle was advanced from a posterior approach into the right iliac bone. Needle positioning was confirmed with CT. Initial non heparinized and heparinized aspirate samples were obtained of bone marrow.  Core biopsy was performed through the OnControl needle. Single core biopsy sample was obtained.  COMPLICATIONS: None  FINDINGS: Due to the patient's cerebral palsy, the procedure was performed in a decubitus position with the left side down in order to maintain the airway in a more stable body position. The biopsy was technically successful. Inspection of initial aspirate did reveal visible particles. An intact core biopsy  sample was obtained.  IMPRESSION: CT guided bone marrow biopsy of right posterior iliac bone with both aspirate and core samples obtained.   Electronically Signed   By: Aletta Edouard M.D.   On: 06/26/2014 13:59   Dg Chest Port 1 View  07/13/2014   CLINICAL DATA:  Respiratory distress. History of spastic quadriplegia/cerebral palsy.  EXAM: PORTABLE CHEST - 1 VIEW  COMPARISON:  Chest radiograph Jun 19, 2014  FINDINGS: Cardiomediastinal silhouette is unremarkable for this habitus limited examination with low lung volumes. Bandlike density RIGHT lower lobe with elevated RIGHT hemidiaphragm. No pneumothorax. Single lumen RIGHT chest Port-A-Cath with distal tip projecting distal superior vena cava, unchanged. No pneumothorax. Thoracolumbar Harrington rods.  IMPRESSION: RIGHT lower lobe discoid atelectasis.   Electronically Signed   By: Elon Alas M.D.   On: 07/13/2014 06:18   Dg Chest Port 1 View  06/19/2014   CLINICAL DATA:  Fever since 06/15/2014.  EXAM: PORTABLE CHEST - 1 VIEW  COMPARISON:  Single view of the chest 05/11/2014 and 04/12/2014.  FINDINGS: Lung volumes are low as on the comparison examinations. Port-A-Cath is again seen. The lungs appear clear. Heart size is normal. No pneumothorax or pleural effusion. Spinal fusion hardware for scoliosis is again seen.  IMPRESSION: No acute disease.   Electronically Signed   By: Inge Rise M.D.   On: 06/19/2014 18:40   Dg Abd Portable 1v  06/21/2014   CLINICAL DATA:  Patient with history of small bowel obstruction.  EXAM: PORTABLE ABDOMEN - 1 VIEW  COMPARISON:  Abdominal radiograph 09/15/2012  FINDINGS: Gas is demonstrated within nondilated loops of large and small bowel in a nonobstructed pattern. Supine evaluation limited for the detection of free intraperitoneal air. Persistent elevation of the right hemidiaphragm. Minimal heterogeneous opacities left lung base. Spinal fusion hardware. Right lower quadrant stimulator device. G-tube projects over  the left upper quadrant.  IMPRESSION: Nonobstructed bowel gas pattern.  Minimal heterogeneous opacities left lung base may represent atelectasis.   Electronically Signed   By: Lovey Newcomer M.D.   On: 06/21/2014 17:57    Microbiology: Recent Results (from the past 240 hour(s))  Blood Culture (routine x 2)     Status: None (Preliminary result)   Collection Time: 07/13/14  6:20 AM  Result Value Ref Range Status   Specimen Description BLOOD LEFT HAND  Final   Special Requests BOTTLES DRAWN AEROBIC ONLY 3CC  Final   Culture   Final           BLOOD CULTURE RECEIVED NO GROWTH TO DATE CULTURE WILL BE HELD FOR 5 DAYS BEFORE ISSUING A FINAL NEGATIVE REPORT Performed at [facility]    Report Status PENDING  Incomplete  Blood Culture (routine x 2)     Status: None (Preliminary result)   Collection Time: 07/13/14  6:25 AM  Result Value Ref Range Status   Specimen Description BLOOD RIGHT ARM  Final   Special Requests BOTTLES DRAWN AEROBIC AND ANAEROBIC 10CC  Final   Culture   Final           BLOOD CULTURE RECEIVED NO GROWTH TO DATE CULTURE WILL BE HELD FOR 5 DAYS BEFORE ISSUING A FINAL NEGATIVE REPORT Performed at [facility]    Report Status PENDING  Incomplete  Urine culture     Status: None   Collection Time: 07/13/14  7:00 AM  Result Value Ref Range Status   Specimen Description URINE, CLEAN CATCH  Final   Special Requests NONE  Final   Colony Count   Final    50,000 COLONIES/ML Performed at [facility]    Culture   Final    KLEBSIELLA PNEUMONIAE Performed at [facility]    Report Status 07/15/2014 FINAL  Final   Organism ID, Bacteria KLEBSIELLA PNEUMONIAE  Final      Susceptibility   Klebsiella pneumoniae - MIC*    AMPICILLIN >=32 RESISTANT Resistant     CEFAZOLIN <=4 SENSITIVE Sensitive     CEFTRIAXONE <=1 SENSITIVE Sensitive     CIPROFLOXACIN <=0.25 SENSITIVE Sensitive     GENTAMICIN <=1 SENSITIVE Sensitive     LEVOFLOXACIN 1 SENSITIVE  Sensitive     NITROFURANTOIN 128 RESISTANT Resistant     TOBRAMYCIN <=1 SENSITIVE Sensitive     TRIMETH/SULFA <=20 SENSITIVE Sensitive     PIP/TAZO 16 SENSITIVE Sensitive     * KLEBSIELLA PNEUMONIAE  MRSA PCR Screening     Status: None   Collection Time: 07/13/14  4:05 PM  Result Value Ref Range Status   MRSA by PCR NEGATIVE NEGATIVE Final    Comment:        The GeneXpert MRSA Assay (FDA approved for NASAL specimens only), is one component of a comprehensive MRSA colonization surveillance program. It is not intended to diagnose MRSA infection nor to guide or monitor treatment for MRSA infections.   Clostridium Difficile by PCR (not at [facility])     Status: None   Collection Time: 07/15/14  7:06 AM  Result Value Ref Range Status   C difficile by pcr NEGATIVE NEGATIVE Final  Labs: Basic Metabolic Panel:  Recent Labs Lab 07/13/14 0625 07/14/14 1030 07/15/14 0600 07/16/14 0400  NA 144 144 144 144  K 3.9 3.3* 4.3 3.2*  CL 101 105 105 104  CO2 29 29 29 29   GLUCOSE 122* 90 133* 143*  BUN 28* 13 12 13   CREATININE 0.80 0.71 0.77 0.95  CALCIUM 9.6 8.7* 9.1 9.0   Liver Function Tests:  Recent Labs Lab 07/13/14 0625 07/14/14 1030  AST 54* 42*  ALT 84* 59  ALKPHOS 162* 112  BILITOT 0.7 1.3*  PROT 7.0 5.2*  ALBUMIN 3.6 2.8*   No results for input(s): LIPASE, AMYLASE in the last 168 hours. No results for input(s): AMMONIA in the last 168 hours. CBC:  Recent Labs Lab 07/13/14 0625 07/14/14 1030 07/15/14 0600 07/16/14 0400  WBC 15.7* 6.5 3.3* 2.1*  NEUTROABS 14.8*  --  3.0 1.5*  HGB 10.0* 6.9* 10.1* 10.4*  HCT 32.1* 22.0* 31.8* 32.4*  MCV 103.5* 104.3* 96.4 97.3  PLT 168 111* 112* 134*   Cardiac Enzymes: No results for input(s): CKTOTAL, CKMB, CKMBINDEX, TROPONINI in the last 168 hours. BNP: BNP (last 3 results) No results for input(s): BNP in the last 8760 hours.  ProBNP (last 3 results) No results for input(s): PROBNP in the last 8760  hours.  CBG:  Recent Labs Lab 07/15/14 2049 07/16/14 0037 07/16/14 0555 07/16/14 0850 07/16/14 1226  GLUCAP 135* 122* 143* 94 121*       Signed:  Aevah Stansbery K  [facility] 07/16/2014, 8:09 PM

## 2014-07-19 ENCOUNTER — Ambulatory Visit: Payer: Medicaid Other | Admitting: Hematology & Oncology

## 2014-07-19 ENCOUNTER — Ambulatory Visit: Payer: Medicaid Other

## 2014-07-19 ENCOUNTER — Ambulatory Visit (HOSPITAL_BASED_OUTPATIENT_CLINIC_OR_DEPARTMENT_OTHER): Payer: Medicaid Other

## 2014-07-19 ENCOUNTER — Encounter: Payer: Self-pay | Admitting: Nurse Practitioner

## 2014-07-19 VITALS — BP 123/83 | HR 122 | Resp 20

## 2014-07-19 DIAGNOSIS — Z5189 Encounter for other specified aftercare: Secondary | ICD-10-CM | POA: Diagnosis not present

## 2014-07-19 DIAGNOSIS — D61818 Other pancytopenia: Secondary | ICD-10-CM | POA: Diagnosis not present

## 2014-07-19 DIAGNOSIS — D709 Neutropenia, unspecified: Secondary | ICD-10-CM

## 2014-07-19 DIAGNOSIS — R5081 Fever presenting with conditions classified elsewhere: Principal | ICD-10-CM

## 2014-07-19 LAB — CULTURE, BLOOD (ROUTINE X 2)
CULTURE: NO GROWTH
Culture: NO GROWTH

## 2014-07-19 MED ORDER — FILGRASTIM 480 MCG/0.8ML IJ SOSY
480.0000 ug | PREFILLED_SYRINGE | Freq: Once | INTRAMUSCULAR | Status: AC
  Administered 2014-07-19: 480 ug via SUBCUTANEOUS
  Filled 2014-07-19: qty 0.8

## 2014-07-19 NOTE — Progress Notes (Signed)
Spoke with [place] @ [place]. Orders given for cbc to be drawn in the home and results sent to office immediately. She verbalized understanding. Mother [name] is aware that labs are to be drawn and we will be waiting on results.

## 2014-07-20 ENCOUNTER — Inpatient Hospital Stay (HOSPITAL_COMMUNITY)
Admission: EM | Admit: 2014-07-20 | Discharge: 2014-07-26 | DRG: 871 | Disposition: A | Discharge status: Home Health | Payer: Medicaid Other | Attending: Internal Medicine | Admitting: Internal Medicine

## 2014-07-20 ENCOUNTER — Other Ambulatory Visit: Payer: Self-pay

## 2014-07-20 ENCOUNTER — Emergency Department (HOSPITAL_COMMUNITY): Payer: Medicaid Other

## 2014-07-20 ENCOUNTER — Encounter (HOSPITAL_COMMUNITY): Payer: Self-pay | Admitting: Emergency Medicine

## 2014-07-20 DIAGNOSIS — J690 Pneumonitis due to inhalation of food and vomit: Secondary | ICD-10-CM | POA: Diagnosis present

## 2014-07-20 DIAGNOSIS — Z7401 Bed confinement status: Secondary | ICD-10-CM

## 2014-07-20 DIAGNOSIS — R140 Abdominal distension (gaseous): Secondary | ICD-10-CM | POA: Diagnosis not present

## 2014-07-20 DIAGNOSIS — R0902 Hypoxemia: Secondary | ICD-10-CM | POA: Diagnosis present

## 2014-07-20 DIAGNOSIS — I5032 Chronic diastolic (congestive) heart failure: Secondary | ICD-10-CM | POA: Diagnosis present

## 2014-07-20 DIAGNOSIS — L271 Localized skin eruption due to drugs and medicaments taken internally: Secondary | ICD-10-CM | POA: Diagnosis not present

## 2014-07-20 DIAGNOSIS — D638 Anemia in other chronic diseases classified elsewhere: Secondary | ICD-10-CM | POA: Diagnosis present

## 2014-07-20 DIAGNOSIS — K9413 Enterostomy malfunction: Secondary | ICD-10-CM | POA: Diagnosis not present

## 2014-07-20 DIAGNOSIS — K567 Ileus, unspecified: Secondary | ICD-10-CM | POA: Diagnosis present

## 2014-07-20 DIAGNOSIS — M4140 Neuromuscular scoliosis, site unspecified: Secondary | ICD-10-CM | POA: Diagnosis present

## 2014-07-20 DIAGNOSIS — E8770 Fluid overload, unspecified: Secondary | ICD-10-CM | POA: Diagnosis not present

## 2014-07-20 DIAGNOSIS — T360X5A Adverse effect of penicillins, initial encounter: Secondary | ICD-10-CM | POA: Diagnosis not present

## 2014-07-20 DIAGNOSIS — D619 Aplastic anemia, unspecified: Secondary | ICD-10-CM | POA: Diagnosis not present

## 2014-07-20 DIAGNOSIS — E872 Acidosis: Secondary | ICD-10-CM | POA: Diagnosis present

## 2014-07-20 DIAGNOSIS — A419 Sepsis, unspecified organism: Principal | ICD-10-CM | POA: Diagnosis present

## 2014-07-20 DIAGNOSIS — L0390 Cellulitis, unspecified: Secondary | ICD-10-CM | POA: Diagnosis present

## 2014-07-20 DIAGNOSIS — E876 Hypokalemia: Secondary | ICD-10-CM | POA: Diagnosis present

## 2014-07-20 DIAGNOSIS — G800 Spastic quadriplegic cerebral palsy: Secondary | ICD-10-CM | POA: Diagnosis present

## 2014-07-20 DIAGNOSIS — Z79899 Other long term (current) drug therapy: Secondary | ICD-10-CM

## 2014-07-20 DIAGNOSIS — K219 Gastro-esophageal reflux disease without esophagitis: Secondary | ICD-10-CM | POA: Diagnosis present

## 2014-07-20 DIAGNOSIS — Z66 Do not resuscitate: Secondary | ICD-10-CM | POA: Diagnosis present

## 2014-07-20 DIAGNOSIS — Z515 Encounter for palliative care: Secondary | ICD-10-CM | POA: Diagnosis not present

## 2014-07-20 DIAGNOSIS — D61818 Other pancytopenia: Secondary | ICD-10-CM | POA: Diagnosis present

## 2014-07-20 DIAGNOSIS — Z8673 Personal history of transient ischemic attack (TIA), and cerebral infarction without residual deficits: Secondary | ICD-10-CM | POA: Diagnosis not present

## 2014-07-20 DIAGNOSIS — M810 Age-related osteoporosis without current pathological fracture: Secondary | ICD-10-CM | POA: Diagnosis present

## 2014-07-20 DIAGNOSIS — G40209 Localization-related (focal) (partial) symptomatic epilepsy and epileptic syndromes with complex partial seizures, not intractable, without status epilepticus: Secondary | ICD-10-CM | POA: Diagnosis present

## 2014-07-20 DIAGNOSIS — G40309 Generalized idiopathic epilepsy and epileptic syndromes, not intractable, without status epilepticus: Secondary | ICD-10-CM | POA: Diagnosis not present

## 2014-07-20 DIAGNOSIS — Z6836 Body mass index (BMI) 36.0-36.9, adult: Secondary | ICD-10-CM | POA: Diagnosis not present

## 2014-07-20 DIAGNOSIS — J811 Chronic pulmonary edema: Secondary | ICD-10-CM

## 2014-07-20 DIAGNOSIS — K529 Noninfective gastroenteritis and colitis, unspecified: Secondary | ICD-10-CM | POA: Diagnosis not present

## 2014-07-20 DIAGNOSIS — E870 Hyperosmolality and hypernatremia: Secondary | ICD-10-CM | POA: Diagnosis present

## 2014-07-20 DIAGNOSIS — G4733 Obstructive sleep apnea (adult) (pediatric): Secondary | ICD-10-CM | POA: Diagnosis present

## 2014-07-20 DIAGNOSIS — G808 Other cerebral palsy: Secondary | ICD-10-CM | POA: Diagnosis not present

## 2014-07-20 DIAGNOSIS — R509 Fever, unspecified: Secondary | ICD-10-CM

## 2014-07-20 DIAGNOSIS — Z931 Gastrostomy status: Secondary | ICD-10-CM | POA: Diagnosis not present

## 2014-07-20 DIAGNOSIS — Z87898 Personal history of other specified conditions: Secondary | ICD-10-CM | POA: Diagnosis not present

## 2014-07-20 DIAGNOSIS — L539 Erythematous condition, unspecified: Secondary | ICD-10-CM

## 2014-07-20 DIAGNOSIS — Z789 Other specified health status: Secondary | ICD-10-CM | POA: Insufficient documentation

## 2014-07-20 LAB — COMPREHENSIVE METABOLIC PANEL
ALT: 160 U/L — ABNORMAL HIGH (ref 17–63)
ANION GAP: 12 (ref 5–15)
AST: 35 U/L (ref 15–41)
Albumin: 3.2 g/dL — ABNORMAL LOW (ref 3.5–5.0)
Alkaline Phosphatase: 224 U/L — ABNORMAL HIGH (ref 38–126)
BILIRUBIN TOTAL: 1.5 mg/dL — AB (ref 0.3–1.2)
BUN: 17 mg/dL (ref 6–20)
CO2: 34 mmol/L — AB (ref 22–32)
CREATININE: 1.18 mg/dL (ref 0.61–1.24)
Calcium: 9.5 mg/dL (ref 8.9–10.3)
Chloride: 105 mmol/L (ref 101–111)
GFR calc Af Amer: >60 mL/min (ref >60)
Glucose, Bld: 85 mg/dL (ref 65–99)
Potassium: 3.5 mmol/L (ref 3.5–5.1)
Sodium: 151 mmol/L — ABNORMAL HIGH (ref 135–145)
Total Protein: 6.3 g/dL — ABNORMAL LOW (ref 6.5–8.1)

## 2014-07-20 LAB — CBC WITH DIFFERENTIAL/PLATELET
Basophils Absolute: 0 10*3/uL (ref 0.0–0.1)
Basophils Relative: 0 % (ref 0–1)
EOS PCT: 1 % (ref 0–5)
Eosinophils Absolute: 0.1 10*3/uL (ref 0.0–0.7)
HEMATOCRIT: 36.2 % — AB (ref 39.0–52.0)
Hemoglobin: 10.9 g/dL — ABNORMAL LOW (ref 13.0–17.0)
Lymphocytes Relative: 5 % — ABNORMAL LOW (ref 12–46)
Lymphs Abs: 0.4 10*3/uL — ABNORMAL LOW (ref 0.7–4.0)
MCH: 31.8 pg (ref 26.0–34.0)
MCHC: 30.1 g/dL (ref 30.0–36.0)
MCV: 105.5 fL — AB (ref 78.0–100.0)
Monocytes Absolute: 0.7 10*3/uL (ref 0.1–1.0)
Monocytes Relative: 8 % (ref 3–12)
Neutro Abs: 7.1 10*3/uL (ref 1.7–7.7)
Neutrophils Relative %: 86 % — ABNORMAL HIGH (ref 43–77)
PLATELETS: 135 10*3/uL — AB (ref 150–400)
RBC: 3.43 MIL/uL — AB (ref 4.22–5.81)
RDW: 20 % — AB (ref 11.5–15.5)
WBC: 8.3 10*3/uL (ref 4.0–10.5)

## 2014-07-20 LAB — BASIC METABOLIC PANEL
Anion gap: 11 (ref 5–15)
BUN: 15 mg/dL (ref 6–20)
CO2: 34 mmol/L — AB (ref 22–32)
Calcium: 8.7 mg/dL — ABNORMAL LOW (ref 8.9–10.3)
Chloride: 105 mmol/L (ref 101–111)
Creatinine, Ser: 1.2 mg/dL (ref 0.61–1.24)
GFR calc Af Amer: >60 mL/min (ref >60)
GFR calc non Af Amer: >60 mL/min (ref >60)
GLUCOSE: 89 mg/dL (ref 65–99)
Potassium: 3.9 mmol/L (ref 3.5–5.1)
SODIUM: 150 mmol/L — AB (ref 135–145)

## 2014-07-20 LAB — URINALYSIS, ROUTINE W REFLEX MICROSCOPIC
BILIRUBIN URINE: NEGATIVE
GLUCOSE, UA: NEGATIVE mg/dL
Hgb urine dipstick: NEGATIVE
KETONES UR: NEGATIVE mg/dL
LEUKOCYTES UA: NEGATIVE
Nitrite: NEGATIVE
PROTEIN: 100 mg/dL — AB
Specific Gravity, Urine: 1.015 (ref 1.005–1.030)
Urobilinogen, UA: 0.2 mg/dL (ref 0.0–1.0)
pH: 5.5 (ref 5.0–8.0)

## 2014-07-20 LAB — LACTIC ACID, PLASMA: LACTIC ACID, VENOUS: 4.6 mmol/L — AB (ref 0.5–2.0)

## 2014-07-20 LAB — URINE MICROSCOPIC-ADD ON

## 2014-07-20 LAB — I-STAT CG4 LACTIC ACID, ED
Lactic Acid, Venous: 3.78 mmol/L (ref 0.5–2.0)
Lactic Acid, Venous: 3.85 mmol/L (ref 0.5–2.0)

## 2014-07-20 MED ORDER — VANCOMYCIN HCL IN DEXTROSE 1-5 GM/200ML-% IV SOLN
1000.0000 mg | Freq: Once | INTRAVENOUS | Status: AC
  Administered 2014-07-20: 1000 mg via INTRAVENOUS
  Filled 2014-07-20: qty 200

## 2014-07-20 MED ORDER — PSEUDOEPHEDRINE HCL 30 MG/5ML PO SYRP
30.0000 mg | ORAL_SOLUTION | Freq: Three times a day (TID) | ORAL | Status: DC
  Administered 2014-07-23 – 2014-07-26 (×11): 30 mg
  Filled 2014-07-20 (×19): qty 5

## 2014-07-20 MED ORDER — PANTOPRAZOLE SODIUM 40 MG IV SOLR
40.0000 mg | Freq: Two times a day (BID) | INTRAVENOUS | Status: DC
  Administered 2014-07-22 – 2014-07-26 (×9): 40 mg via INTRAVENOUS
  Filled 2014-07-20 (×13): qty 40

## 2014-07-20 MED ORDER — GABAPENTIN 250 MG/5ML PO SOLN
900.0000 mg | Freq: Three times a day (TID) | ORAL | Status: DC
  Administered 2014-07-23 – 2014-07-26 (×11): 900 mg
  Filled 2014-07-20 (×19): qty 18

## 2014-07-20 MED ORDER — METRONIDAZOLE IN NACL 5-0.79 MG/ML-% IV SOLN
500.0000 mg | Freq: Three times a day (TID) | INTRAVENOUS | Status: DC
  Administered 2014-07-20 – 2014-07-21 (×2): 500 mg via INTRAVENOUS
  Filled 2014-07-20 (×4): qty 100

## 2014-07-20 MED ORDER — HEPARIN SODIUM (PORCINE) 5000 UNIT/ML IJ SOLN
5000.0000 [IU] | Freq: Three times a day (TID) | INTRAMUSCULAR | Status: DC
  Administered 2014-07-21 – 2014-07-25 (×13): 5000 [IU] via SUBCUTANEOUS
  Filled 2014-07-20 (×20): qty 1

## 2014-07-20 MED ORDER — MIDAZOLAM 5 MG/ML PEDIATRIC INJ FOR INTRANASAL/SUBLINGUAL USE: 10.0000 mg | INTRAMUSCULAR | Status: DC | PRN

## 2014-07-20 MED ORDER — METOCLOPRAMIDE HCL 5 MG/ML IJ SOLN
5.0000 mg | Freq: Four times a day (QID) | INTRAMUSCULAR | Status: DC
  Administered 2014-07-20 – 2014-07-22 (×6): 5 mg via INTRAVENOUS
  Filled 2014-07-20 (×7): qty 1
  Filled 2014-07-20: qty 2
  Filled 2014-07-20 (×4): qty 1

## 2014-07-20 MED ORDER — SODIUM CHLORIDE 0.9 % IV BOLUS (SEPSIS)
1000.0000 mL | Freq: Once | INTRAVENOUS | Status: AC
  Administered 2014-07-20: 1000 mL via INTRAVENOUS

## 2014-07-20 MED ORDER — CLOBAZAM 2.5 MG/ML PO SUSP
2.5000 mg | Freq: Three times a day (TID) | ORAL | Status: DC
  Administered 2014-07-21 – 2014-07-26 (×17): 2.5 mg
  Filled 2014-07-20 (×17): qty 4

## 2014-07-20 MED ORDER — PIPERACILLIN-TAZOBACTAM 3.375 G IVPB 30 MIN
3.3750 g | Freq: Once | INTRAVENOUS | Status: AC
  Administered 2014-07-20: 3.375 g via INTRAVENOUS
  Filled 2014-07-20: qty 50

## 2014-07-20 MED ORDER — DIAZEPAM 2.5 MG RE GEL: 12.5000 mg | RECTAL | Status: DC | PRN

## 2014-07-20 MED ORDER — KCL IN DEXTROSE-NACL 20-5-0.45 MEQ/L-%-% IV SOLN
Freq: Once | INTRAVENOUS | Status: AC
  Administered 2014-07-20: 19:00:00 via INTRAVENOUS
  Filled 2014-07-20: qty 1000

## 2014-07-20 MED ORDER — ALBUTEROL SULFATE (2.5 MG/3ML) 0.083% IN NEBU
2.5000 mg | INHALATION_SOLUTION | Freq: Four times a day (QID) | RESPIRATORY_TRACT | Status: DC
  Administered 2014-07-20 – 2014-07-26 (×22): 2.5 mg via RESPIRATORY_TRACT
  Filled 2014-07-20 (×22): qty 3

## 2014-07-20 MED ORDER — PSEUDOEPHEDRINE HCL 15 MG/5ML PO LIQD: 30.0000 mg | Freq: Three times a day (TID) | ORAL | Status: DC

## 2014-07-20 MED ORDER — DIAZEPAM 20 MG RE GEL: 12.5000 mg | RECTAL | Status: DC | PRN

## 2014-07-20 MED ORDER — ACETAMINOPHEN 650 MG RE SUPP
650.0000 mg | Freq: Once | RECTAL | Status: AC
  Administered 2014-07-20: 650 mg via RECTAL
  Filled 2014-07-20: qty 1

## 2014-07-20 MED ORDER — DEXTROSE-NACL 5-0.45 % IV SOLN
INTRAVENOUS | Status: DC
  Administered 2014-07-20 – 2014-07-22 (×3): via INTRAVENOUS

## 2014-07-20 MED ORDER — ALBUTEROL SULFATE (2.5 MG/3ML) 0.083% IN NEBU
5.0000 mg | INHALATION_SOLUTION | Freq: Once | RESPIRATORY_TRACT | Status: AC
  Administered 2014-07-20: 5 mg via RESPIRATORY_TRACT
  Filled 2014-07-20: qty 6

## 2014-07-20 MED ORDER — PIPERACILLIN-TAZOBACTAM 3.375 G IVPB
3.3750 g | Freq: Three times a day (TID) | INTRAVENOUS | Status: DC
  Administered 2014-07-21 – 2014-07-24 (×11): 3.375 g via INTRAVENOUS
  Filled 2014-07-20 (×12): qty 50

## 2014-07-20 NOTE — ED Notes (Addendum)
Discharged last week after admission for aspiration pneumonia.On Levaquin. Has been wearing Bipap at night. This morning had what looked like vomited material in facial hair and on tongue. Caretaker concerned for possible aspiration again. Respirations more labored today. Sats 91% on RA per EMS. More lethargic today than usual, alert on arrival. Fever at home of 101.6. Got Tylenol suppository at home at 1200. Caretaker reports neutropenic with WBC of 1.8 yesterday.  Caretaker also concerned for ileus; states abdomen was distended yesterday and getting undigested material back from [place].

## 2014-07-20 NOTE — ED Provider Notes (Signed)
CSN: [id number]     Arrival date & time 07/20/14  1528 History   First MD Initiated Contact with Patient 07/20/14 1550     Chief Complaint  Patient presents with  . Respiratory Distress  . Fever    Level V caveat due to nonverbal status (Consider location/radiation/quality/duration/timing/severity/associated sxs/prior Treatment) Patient is a 24 y.o. male presenting with fever. The history is provided by the patient and a parent.  Fever  patient has CP. Mother is his caregiver and is well on top of his care. States he has had abdominal distention and she decreased his G-tube feedings. States he has had nothing except some anti-seizure medicine since yesterday. States there has been some fluid that came out of his mouth and thinks he may have aspirated. He has required BiPAP at home with 100% oxygen. He has recently been on some oxygen all the time usually does not require 100%. Recent admission to hospital and is on Levaquin for urinary tract infection. He is now a DO NOT RESUSCITATE. Fevers up to 101 at home. No bowel movement in 2 days but his diarrhea stopped. He has a condom catheter that has not had a change in the output.  Past Medical History  Diagnosis Date  . CP (cerebral palsy), spastic, quadriplegic   . Osteoporosis   . Undescended testes   . Seasonal allergies   . IVH (intraventricular hemorrhage) 1990/10/11    Grade IV  . Hip dislocation, bilateral   . Dysphagia   . Retinopathy of prematurity   . 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 Neuromuscular scoliosis   . Osteoporosis   . Complex partial seizures   . Generalized convulsive epilepsy without mention of intractable epilepsy   . Sinus bradycardia     HR drops to 38-40 while sleeping  . Blister of right heel     fluid filled; origin unknown  . Kidney stones     ?  Marland Kitchen Pneumonia      chronic pneumonia ,respitory failure dx Augest 2014  . Aspiration pneumonia     "chronic" (04/12/2014)  . OSA treated with BiPAP      "since age 88"   . Anemia   . 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 GERD (gastroesophageal reflux disease)   . Epilepsy   . Spastic quadriplegia   . Neutropenia 07/03/2014   Past Surgical History  Procedure Laterality Date  . Mole removal  "several B/T 2008-2010"    "from all over"  . Jejunostomy feeding tube  03/08/2013; ~ 09/2013; ~ 01/2014    "transgastric-jujunal feeding tube"  . Gastrostomy tube placement  11./02/1998       . Button change  12/15/2010    Procedure: BUTTON CHANGE;  Surgeon: Gatha Mayer, MD;  Location: [facility];  Service: Endoscopy;  Laterality: N/A;  . Peg placement  10/07/2011    Procedure: PERCUTANEOUS ENDOSCOPIC GASTROSTOMY (PEG) REPLACEMENT;  Surgeon: Lafayette Dragon, MD;  Location: [facility];  Service: Endoscopy;  Laterality: N/A;  Needs 18 F 2.5 button ordered-dl  . Inguinal hernia repair Bilateral 1992  . Retinopathy of prematurity surgery  1992  . Achilles tendon lengthening Bilateral 12/1998  . Tendon release  12/1998    Soft tissue releases  wrists and fingers [Other]  . Infusion pump implantation  07/25/2000; 2013    Intrathecal baclofen pump  . Peg placement N/A 06/05/2012    Procedure: PERCUTANEOUS ENDOSCOPIC GASTROSTOMY (  PEG) REPLACEMENT;  Surgeon: Lafayette Dragon, MD;  Location: [facility];  Service: Endoscopy;  Laterality: N/A;  button 52fr.2.5cm  . Strabismus surgery Bilateral 1993    "3 on right; 2 on left"  . Back surgery  ~ 2008    Harrington Rods in back needs to be log rolled  . Tendon repair Bilateral 09/05/2012    Procedure: LENGTHENING OF DIGITAL FLEXOR TENDONS BILTERAL HANDS;  Surgeon: Jolyn Nap, MD;  Location: [place];  Service: Orthopedics;  Laterality: Bilateral;  . Peg placement N/A 09/13/2012    Procedure: PERCUTANEOUS ENDOSCOPIC GASTROSTOMY (PEG) REPLACEMENT;  Surgeon: Lafayette Dragon, MD;  Location: [facility];  Service: Endoscopy;  Laterality: N/A;  . Flexible  sigmoidoscopy N/A 10/30/2012    Procedure: FLEXIBLE SIGMOIDOSCOPY;  Surgeon: Cleotis Nipper, MD;  Location: [facility];  Service: Endoscopy;  Laterality: N/A;  . Peg placement N/A 11/15/2012    Procedure: PERCUTANEOUS ENDOSCOPIC GASTROSTOMY (PEG) REPLACEMENT;  Surgeon: Jeryl Columbia, MD;  Location: [facility];  Service: Endoscopy;  Laterality: N/A;  . Gastrostomy tube placement  11./02/1998  . Rectal biopsy  10/29/2012    S/P diarrhea from Vancomycin  . Portacath placement Right 11/25/2012    chest  . Eye surgery     Family History  Problem Relation Age of Onset  . Adopted: Yes   History  Substance Use Topics  . Smoking status: Never Smoker   . Smokeless tobacco: Never Used     Comment: never used tobacco  . Alcohol Use: No    Review of Systems  Unable to perform ROS Constitutional: Positive for fever.      Allergies  Depakote; Vimpat; Keppra; Adhesive; Amoxicillin-pot clavulanate; and Neulasta  Home Medications   Prior to Admission medications   Medication Sig Start Date End Date Taking? Authorizing Provider  acetaminophen (TYLENOL) 160 MG/5ML solution 480 mg by Gastric Tube route every 6 (six) hours as needed for fever.   Yes Historical Provider, MD  acetaminophen (TYLENOL) 80 MG suppository Place 480 mg rectally every 4 (four) hours as needed for fever.   Yes Historical Provider, MD  albuterol (PROVENTIL HFA;VENTOLIN HFA) 108 (90 BASE) MCG/ACT inhaler Inhale 2 puffs into the lungs every 4 (four) hours as needed for shortness of breath.   Yes Historical Provider, MD  albuterol (PROVENTIL) (2.5 MG/3ML) 0.083% nebulizer solution Take 2.5 mg by nebulization See admin instructions. Give 1 vial (2.5 mg) twice daily (8am and 8pm) and every 4 hours as needed for shortness of breath or wheezing 10/11/12  Yes Elsie Stain, MD  Amino Acids-Protein Hydrolys (FEEDING SUPPLEMENT, PRO-STAT SUGAR FREE 64,) LIQD Take 30 mLs by mouth 2 (two) times daily. Patient taking differently: 30  mLs by Gastric Tube route 2 (two) times daily. 12p and 5p 06/27/14  Yes Velvet Bathe, MD  baclofen (GABLOFEN) 40000 MCG/20ML SOLN by Intrathecal route continuous. 385.2 mcg in 24 hours 05/04/12  Yes Jodi Geralds, MD  bag balm OINT ointment Apply 1 application topically See admin instructions. Apply to sacral area with each diaper change   Yes Historical Provider, MD  calcium carbonate, dosed in mg elemental calcium, 1250 MG/5ML 1,250 mg by Gastric Tube route 3 (three) times daily. 8am , 2pm, and 8pm   Yes Historical Provider, MD  cloBAZam (ONFI) 2.5 MG/ML solution Place 2.5 mL in gastrostomy tube 3 times daily Patient taking differently: 2.5 mg by Gastric Tube route 3 (three) times daily. 8am, 2pm, 8pm 05/17/14  Yes Jodi Geralds, MD  DIASTAT ACUDIAL 20 MG GEL Place 12.5 mg rectally as needed (for seizure lasting 2 minutes or longer or repetitive seizures - no more than 1 dose in 12 hours (not given with nasal versed)).  04/01/14  Yes Historical Provider, MD  dicyclomine (BENTYL) 10 MG/5ML syrup 10 mg by Gastric Tube route every 8 (eight) hours as needed (abdominal cramps). Do not exceed 5 days in one week   Yes Historical Provider, MD  diphenoxylate-atropine (LOMOTIL) 2.5-0.025 MG/5ML liquid 10 mLs by Gastric Tube route every 6 (six) hours as needed for diarrhea or loose stools.    Yes Historical Provider, MD  folic acid (FOLVITE) 1 MG tablet 1 mg by Gastric Tube route daily. 8:00am per G tube   Yes Historical Provider, MD  furosemide (LASIX) 10 MG/ML solution 20 mg by Gastric Tube route daily as needed for fluid or edema. Maximum 3 times week   Yes Historical Provider, MD  gabapentin (NEURONTIN) 250 MG/5ML solution Take 18 mL 3 times daily Patient taking differently: Place 900 mg into feeding tube 3 (three) times daily. Give 18 mls (900 mg) at 8am, 2pm, 8pm (for neuropathy) 05/01/14  Yes Jodi Geralds, MD  ibuprofen (ADVIL,MOTRIN) 100 MG/5ML suspension 400 mg by Gastric Tube route every 6  (six) hours as needed for fever (pain).    Yes Historical Provider, MD  ketoconazole (NIZORAL) 2 % cream Apply 1 application topically 2 (two) times daily as needed for irritation (yeast rash from antibiotics).    Yes Historical Provider, MD  lansoprazole (PREVACID SOLUTAB) 30 MG disintegrating tablet 30 mg by Gastric Tube route 2 (two) times daily. 7:30am and 7:30pm 09/07/12  Yes Lafayette Dragon, MD  levofloxacin (LEVAQUIN) 500 MG tablet 500 mg by Gastric Tube route every evening. 5 day course started 07/17/14 5pm   Yes Historical Provider, MD  metoCLOPramide (REGLAN) 5 MG/5ML solution Place 10 mLs (10 mg total) into feeding tube 4 (four) times daily -  before meals and at bedtime. Patient taking differently: 10 mg by Gastric Tube route 4 (four) times daily -  before meals and at bedtime.  07/12/14  Yes Volanda Napoleon, MD  midazolam (VERSED) 5 MG/ML injection Place 2 mLs (10 mg total) into the nose once. Draw up 63ml in 2 syringes. Remove blue vial access device. Attach syringe to nasal atomizer for intranasal administration. Give 31ml in right nostril x 2 for seizures lasting 2 minutes or longer or for repetitive seizures in a short period of time. Patient taking differently: Place 10 mg into the nose as needed (for seizure lasting 2 minutes or longer or repetitive seizures - no more than 1 dose in 12 hours (if unable to give diastat)). Draw up 38ml in 2 syringes. Remove blue vial access device. Attach syringe to nasal atomizer for intranasal administration. Give 38ml in right nostril x 2 for seizures lasting 2 minutes or longer or for repetitive seizures in a short period of time. 11/05/13  Yes Rockwell Germany, NP  Multiple Vitamin (MULTIVITAMIN) LIQD 10 mLs by Gastric Tube route daily.   Yes Historical Provider, MD  Multiple Vitamins-Minerals (ZINC PO) 5 mLs by Gastric Tube route daily. 244 mg / 5 mL zinc solution compounded by [facility]   Yes Historical Provider, MD  mupirocin ointment (BACTROBAN) 2 %  Apply 1 application topically as needed (to G-T site). Patient taking differently: Apply 1 application topically 3 (three) times daily as needed (for redness at the G-T site).  09/07/12  Yes Lowella Bandy  Olevia Perches, MD  Nutritional Supplements (TWOCAL HN) LIQD 237 mLs by Gastric Tube route See admin instructions. T.3 can at 46 cc hour x 12 hours - water 172 cc pre and post each and extra 172 bid.   Yes Historical Provider, MD  OnabotulinumtoxinA (BOTOX IJ) Inject 300 Units as directed See admin instructions. Every 3 months   Yes Historical Provider, MD  potassium chloride (KLOR-CON) 20 MEQ packet 20 mEq by Gastric Tube route See admin instructions. Give 20 meq daily except twice daily with lasix   Yes Historical Provider, MD  Pseudoephedrine HCl (SUDAFED CHILDRENS) 15 MG/5ML LIQD 30 mg by Gastric Tube route 3 (three) times daily. 6am, 2pm, 8pm   Yes Historical Provider, MD  simethicone (MYLICON) 118 MG chewable tablet 125 mg by Gastric Tube route See admin instructions. 125mg  caplet added to each can of Two Cal 3 times daily   Yes Historical Provider, MD  sodium phosphate (FLEET) enema Place 1 enema rectally daily as needed (gas buildup/ bloating).    Yes Historical Provider, MD  sucralfate (CARAFATE) 1 GM/10ML suspension 1 g by Gastric Tube route 4 (four) times daily. 7am, 12pm, 5pm, 7pm Administer alone 30 minutes prior to other medications.   Yes Historical Provider, MD  Vitamin Mixture (VITAMIN C) LIQD 300 mLs by Gastric Tube route daily.   Yes Historical Provider, MD  Water For Irrigation, Sterile (FREE WATER) SOLN 172 mLs by Gastric Tube route 4 (four) times daily.    Yes Historical Provider, MD  Zinc Oxide (BALMEX EX) Apply 1 application topically See admin instructions. Apply to sacral with each diaper change   Yes Historical Provider, MD  OXYGEN-HELIUM IN Inhale into the lungs. Oxygen PRN to keep O2 Sat at 90%    Historical Provider, MD   BP 106/61 mmHg  Pulse 117  Temp(Src) 98.7 F (37.1 C) (Axillary)   Resp 19  Ht 4\' 2"  (1.27 m)  Wt 131 lb 6.3 oz (59.6 kg)  BMI 36.95 kg/m2  SpO2 100% Physical Exam  Constitutional: He appears well-nourished.  Neck: Neck supple.  Cardiovascular:  Tachycardia  Pulmonary/Chest:  Diffuse harsh breath sounds with some tachypnea  Abdominal: He exhibits distension.  G-tube in left upper quadrant with some surrounding erythema. Mild to moderate distention of abdomen.  Musculoskeletal: He exhibits edema.  Mild edema of lower extremities. Chronic wasting with spasticity of all extremities.  Neurological:  Patient is nonverbal and will not follow commands for me.  Skin: Skin is warm.   baclofen pump to right lower quadrant of abdomen.  ED Course  Procedures (including critical care time) Labs Review Labs Reviewed  COMPREHENSIVE METABOLIC PANEL - Abnormal; Notable for the following:    Sodium 151 (*)    CO2 34 (*)    Total Protein 6.3 (*)    Albumin 3.2 (*)    ALT 160 (*)    Alkaline Phosphatase 224 (*)    Total Bilirubin 1.5 (*)    All other components within normal limits  CBC WITH DIFFERENTIAL/PLATELET - Abnormal; Notable for the following:    RBC 3.43 (*)    Hemoglobin 10.9 (*)    HCT 36.2 (*)    MCV 105.5 (*)    RDW 20.0 (*)    Platelets 135 (*)    Neutrophils Relative % 86 (*)    Lymphocytes Relative 5 (*)    Lymphs Abs 0.4 (*)    All other components within normal limits  URINALYSIS, ROUTINE W REFLEX MICROSCOPIC (NOT AT [facility]) - Abnormal;  Notable for the following:    Protein, ur 100 (*)    All other components within normal limits  URINE MICROSCOPIC-ADD ON - Abnormal; Notable for the following:    Casts HYALINE CASTS (*)    Crystals CA OXALATE CRYSTALS (*)    All other components within normal limits  LACTIC ACID, PLASMA - Abnormal; Notable for the following:    Lactic Acid, Venous 4.6 (*)    All other components within normal limits  BASIC METABOLIC PANEL - Abnormal; Notable for the following:    Sodium 150 (*)    CO2 34 (*)     Calcium 8.7 (*)    All other components within normal limits  I-STAT CG4 LACTIC ACID, ED - Abnormal; Notable for the following:    Lactic Acid, Venous 3.78 (*)    All other components within normal limits  I-STAT CG4 LACTIC ACID, ED - Abnormal; Notable for the following:    Lactic Acid, Venous 3.85 (*)    All other components within normal limits  CULTURE, BLOOD (ROUTINE X 2)  CULTURE, BLOOD (ROUTINE X 2)  URINE CULTURE  CULTURE, EXPECTORATED SPUTUM-ASSESSMENT  GRAM STAIN  CLOSTRIDIUM DIFFICILE BY PCR (NOT AT [facility])  VANCOMYCIN, RANDOM  LEGIONELLA ANTIGEN, URINE  STREP PNEUMONIAE URINARY ANTIGEN  LACTIC ACID, PLASMA  BASIC METABOLIC PANEL  BASIC METABOLIC PANEL    Imaging Review Dg Chest Port 1 View  07/20/2014   CLINICAL DATA:  Respiratory distress, fever.  EXAM: PORTABLE CHEST - 1 VIEW  COMPARISON:  July 13, 2014.  FINDINGS: Harrington rods are again noted. Right-sided Port-A-Cath is unchanged in position. No pneumothorax is noted. Cardiomediastinal silhouette is unchanged compared to prior exam. Left lung is clear. 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 Continued elevation of right hemidiaphragm is noted. Some degree of mild right pleural effusion appears to be present. Right basilar atelectasis noted on prior exam is not well visualized presumably due to effusion. New linear opacity is noted in right upper lobe concerning for subsegmental atelectasis.  IMPRESSION: There appears to be interval development of mild right pleural effusion ; underlying basilar atelectasis or infiltrate cannot be excluded. New mild linear opacity is noted in right upper lobe concerning for subsegmental atelectasis.   Electronically Signed   By: Marijo Conception, M.D.   On: 07/20/2014 16:30   Dg Abd Portable 1v  07/20/2014   CLINICAL DATA:  Abdominal distention  EXAM: PORTABLE ABDOMEN - 1 VIEW  COMPARISON:  Jun 21, 2014  FINDINGS: There is mild bowel dilatation in a pattern suggesting ileus or  possibly a degree of enteritis. Obstruction is not felt to be likely. No free air is seen on this supine examination. There is extensive postoperative change in the visualized thoracic and lumbar spine regions. A stimulator is seen overlying the right lower quadrant with the lead extending into the spinal region. Visualized lung bases are clear. There is a small phlebolith in the pelvis. Gastrostomy catheter is seen in the mid abdomen.  IMPRESSION: The bowel gas pattern suggests early ileus or possibly enteritis. Obstruction not felt to be likely. No free air.   Electronically Signed   By: Lowella Grip III M.D.   On: 07/20/2014 16:49     EKG Interpretation   Date/Time:  Saturday July 20 2014 15:52:04 EDT Ventricular Rate:  130 PR Interval:  119 QRS Duration: 67 QT Interval:  280 QTC Calculation: 412 R Axis:   80 Text Interpretation:  Sinus tachycardia Confirmed by Alvino Chapel  MD,  Presley Gora  [phone]) on 07/20/2014 4:41:31 PM      MDM   Final diagnoses:  Fever, unspecified fever cause  Aspiration pneumonia, unspecified aspiration pneumonia type    Patient with fever. Possible aspiration. History of same. Also has ileus. Has some chronic severe diseases. Lactic acid is mildly elevated appears to always be elevated. Heart rate has been increased also. Will treat for sepsis. Admit to internal medicine. Patient is a DO NOT RESUSCITATE. medicine.    Davonna Belling, MD 07/20/14 [phone]

## 2014-07-20 NOTE — ED Notes (Signed)
Caretaker reports she accessed port and changed condom cath and foley bag today before departure for ED.

## 2014-07-20 NOTE — ED Notes (Signed)
Radiology at bedside doing [place].

## 2014-07-20 NOTE — Progress Notes (Addendum)
ANTIBIOTIC CONSULT NOTE - INITIAL  Pharmacy Consult for Vancomycin + Zosyn Indication: rule out sepsis  Allergies  Allergen Reactions  . Depakote [Divalproex Sodium]     Causes pancreatitis   . Vimpat [Lacosamide] Rash  . Keppra [Levetiracetam] Other (See Comments)    Bone marrow suppression  . Adhesive [Tape] Other (See Comments)    Rips skin off (paper tape is ok)  . Amoxicillin-Pot Clavulanate Rash    If given with fluconazole rash is not as severe or does not appear at all  . Neulasta [Pegfilgrastim] Other (See Comments)    Fever, tachycardia    Patient Measurements:   Wt: 58 kg Ht: 50 inches  Vital Signs: Temp: 101.5 F (38.6 C) (06/18 1554) Temp Source: Rectal (06/18 1554) BP: 129/83 mmHg (06/18 1554) Pulse Rate: 132 (06/18 1554) Intake/Output from previous day:   Intake/Output from this shift:    Labs: No results for input(s): WBC, HGB, PLT, LABCREA, CREATININE in the last 72 hours. Estimated Creatinine Clearance: 67.4 mL/min (by C-G formula based on Cr of 0.95). No results for input(s): VANCOTROUGH, VANCOPEAK, VANCORANDOM, GENTTROUGH, GENTPEAK, GENTRANDOM, TOBRATROUGH, TOBRAPEAK, TOBRARND, AMIKACINPEAK, AMIKACINTROU, AMIKACIN in the last 72 hours.   Microbiology: Recent Results (from the past 720 hour(s))  Culture, blood (routine x 2)     Status: None   Collection Time: 06/20/14  5:43 PM  Result Value Ref Range Status   Specimen Description BLOOD PORTA CATH  Final   Special Requests BOTTLES DRAWN AEROBIC AND ANAEROBIC 5ML  Final   Culture   Final    NO GROWTH 5 DAYS Performed at [facility]    Report Status 06/27/2014 FINAL  Final  Culture, blood (single)     Status: None   Collection Time: 06/21/14  5:20 AM  Result Value Ref Range Status   Specimen Description BLOOD LEFT HAND  Final   Special Requests BOTTLES DRAWN AEROBIC ONLY 5CC  Final   Culture   Final    NO GROWTH 5 DAYS Performed at [facility]    Report Status 06/27/2014  FINAL  Final  Clostridium Difficile by PCR     Status: None   Collection Time: 06/23/14 12:01 PM  Result Value Ref Range Status   C difficile by pcr NEGATIVE NEGATIVE Final  Blood Culture (routine x 2)     Status: None   Collection Time: 07/13/14  6:20 AM  Result Value Ref Range Status   Specimen Description BLOOD LEFT HAND  Final   Special Requests BOTTLES DRAWN AEROBIC ONLY 3CC  Final   Culture   Final    NO GROWTH 5 DAYS Performed at [facility]    Report Status 07/19/2014 FINAL  Final  Blood Culture (routine x 2)     Status: None   Collection Time: 07/13/14  6:25 AM  Result Value Ref Range Status   Specimen Description BLOOD RIGHT ARM  Final   Special Requests BOTTLES DRAWN AEROBIC AND ANAEROBIC 10CC  Final   Culture   Final    NO GROWTH 5 DAYS Performed at [facility]    Report Status 07/19/2014 FINAL  Final  Urine culture     Status: None   Collection Time: 07/13/14  7:00 AM  Result Value Ref Range Status   Specimen Description URINE, CLEAN CATCH  Final   Special Requests NONE  Final   Colony Count   Final    50,000 COLONIES/ML Performed at [facility]  Final    KLEBSIELLA PNEUMONIAE Performed at [facility]    Report Status 07/15/2014 FINAL  Final   Organism ID, Bacteria KLEBSIELLA PNEUMONIAE  Final      Susceptibility   Klebsiella pneumoniae - MIC*    AMPICILLIN >=32 RESISTANT Resistant     CEFAZOLIN <=4 SENSITIVE Sensitive     CEFTRIAXONE <=1 SENSITIVE Sensitive     CIPROFLOXACIN <=0.25 SENSITIVE Sensitive     GENTAMICIN <=1 SENSITIVE Sensitive     LEVOFLOXACIN 1 SENSITIVE Sensitive     NITROFURANTOIN 128 RESISTANT Resistant     TOBRAMYCIN <=1 SENSITIVE Sensitive     TRIMETH/SULFA <=20 SENSITIVE Sensitive     PIP/TAZO 16 SENSITIVE Sensitive     * KLEBSIELLA PNEUMONIAE  MRSA PCR Screening     Status: None   Collection Time: 07/13/14  4:05 PM  Result Value Ref Range Status   MRSA by PCR NEGATIVE NEGATIVE  Final    Comment:        The GeneXpert MRSA Assay (FDA approved for NASAL specimens only), is one component of a comprehensive MRSA colonization surveillance program. It is not intended to diagnose MRSA infection nor to guide or monitor treatment for MRSA infections.   Clostridium Difficile by PCR (not at [facility])     Status: None   Collection Time: 07/15/14  7:06 AM  Result Value Ref Range Status   C difficile by pcr NEGATIVE NEGATIVE Final  Respiratory virus panel     Status: None   Collection Time: 07/15/14 12:25 PM  Result Value Ref Range Status   Respiratory Syncytial Virus A Negative Negative Final   Respiratory Syncytial Virus B Negative Negative Final   Influenza A Negative Negative Final   Influenza B Negative Negative Final   Parainfluenza 1 Negative Negative Final   Parainfluenza 2 Negative Negative Final   Parainfluenza 3 Negative Negative Final   Metapneumovirus Negative Negative Final   Rhinovirus Negative Negative Final   Adenovirus Negative Negative Final    Comment: (NOTE) Performed At: [facility] [address] [place], [place] [id number] Lindon Romp MD OZ:[id number]     Medical History: Past Medical History  Diagnosis Date  . CP (cerebral palsy), spastic, quadriplegic   . Osteoporosis   . Undescended testes   . Seasonal allergies   . IVH (intraventricular hemorrhage) 06-27-1990    Grade IV  . Hip dislocation, bilateral   . Dysphagia   . Retinopathy of prematurity   . 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 Neuromuscular scoliosis   . Osteoporosis   . Complex partial seizures   . Generalized convulsive epilepsy without mention of intractable epilepsy   . Sinus bradycardia     HR drops to 38-40 while sleeping  . Blister of right heel     fluid filled; origin unknown  . Kidney stones     ?  Marland Kitchen Pneumonia      chronic pneumonia ,respitory failure dx Augest 2014  . Aspiration pneumonia     "chronic" (04/12/2014)  . OSA treated with  BiPAP     "since age 73"   . Anemia   . 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 GERD (gastroesophageal reflux disease)   . Epilepsy   . Spastic quadriplegia   . Neutropenia 07/03/2014    Assessment: 24 YOM with hx cerebral palsy and quadriplegia who presented on 6/18 with fever/lethargy to start Vancomycin + Zosyn for  r/o sepsis.   Given the patient's quadriplegia - the patient's renal function is hard to estimate. The patient was known to produce a VT of 16 mcg/ml on a dose of 750 mg IV every 8 hours in May with SCr range of 0.45 - 0.47. Admit SCr today is 1.18 indicating some degree of acute kidney injury. Will wait to check a VR/SCr in the AM to determine clearance and enter additional doses at that time.   Allergy to Augmentin noted as rash but the patient has tolerated Zosyn in the past.   Goal of Therapy:  Vancomycin trough level 15-20 mcg/ml  Plan:  1. Vancomycin 1g IV x 1 dose now 2. No standing Vanc for now - will check a VR in the AM to determine clearance 3. Start Zosyn 3.375g IV every 8 hours (infused over 4 hours) 4. Will continue to follow renal function, culture results, LOT, and antibiotic de-escalation plans   Alycia Rossetti, PharmD, BCPS Clinical Pharmacist Pager: [phone] 07/20/2014 6:40 PM

## 2014-07-20 NOTE — ED Notes (Signed)
Phlebotomy at bedside.

## 2014-07-20 NOTE — Progress Notes (Signed)
Pt. Transferred from ED to 3S14 after report received from [facility] in the ED.  Pt. Is accompanied by mother and they have both been reoriented to the unit with call bell at bedside.  Will continue to monitor.

## 2014-07-20 NOTE — ED Notes (Signed)
Happy meals and drinks provided for family

## 2014-07-20 NOTE — Progress Notes (Signed)
ED RT called report to 3S RT. Pt will need a bedtime BIPAP setup. Wears at mothers request. Albuterol Q6 treatments per home regimen. Mother stays with patient. Pt will transfer shortly.

## 2014-07-21 ENCOUNTER — Inpatient Hospital Stay (HOSPITAL_COMMUNITY): Payer: Medicaid Other

## 2014-07-21 DIAGNOSIS — G40309 Generalized idiopathic epilepsy and epileptic syndromes, not intractable, without status epilepticus: Secondary | ICD-10-CM

## 2014-07-21 DIAGNOSIS — G4733 Obstructive sleep apnea (adult) (pediatric): Secondary | ICD-10-CM

## 2014-07-21 DIAGNOSIS — L539 Erythematous condition, unspecified: Secondary | ICD-10-CM | POA: Diagnosis present

## 2014-07-21 DIAGNOSIS — K529 Noninfective gastroenteritis and colitis, unspecified: Secondary | ICD-10-CM | POA: Diagnosis present

## 2014-07-21 LAB — BASIC METABOLIC PANEL
ANION GAP: 8 (ref 5–15)
Anion gap: 8 (ref 5–15)
Anion gap: 7 (ref 5–15)
BUN: 13 mg/dL (ref 6–20)
BUN: 13 mg/dL (ref 6–20)
BUN: 12 mg/dL (ref 6–20)
CALCIUM: 8.5 mg/dL — AB (ref 8.9–10.3)
CHLORIDE: 106 mmol/L (ref 101–111)
CHLORIDE: 107 mmol/L (ref 101–111)
CHLORIDE: 106 mmol/L (ref 101–111)
CO2: 34 mmol/L — ABNORMAL HIGH (ref 22–32)
CO2: 33 mmol/L — ABNORMAL HIGH (ref 22–32)
CO2: 33 mmol/L — ABNORMAL HIGH (ref 22–32)
CREATININE: 1.05 mg/dL (ref 0.61–1.24)
CREATININE: 1.04 mg/dL (ref 0.61–1.24)
Calcium: 8.6 mg/dL — ABNORMAL LOW (ref 8.9–10.3)
Calcium: 8.4 mg/dL — ABNORMAL LOW (ref 8.9–10.3)
Creatinine, Ser: 1.06 mg/dL (ref 0.61–1.24)
GFR calc Af Amer: >60 mL/min (ref >60)
GFR calc Af Amer: >60 mL/min (ref >60)
GFR calc non Af Amer: >60 mL/min (ref >60)
GFR calc non Af Amer: >60 mL/min (ref >60)
GLUCOSE: 85 mg/dL (ref 65–99)
GLUCOSE: 88 mg/dL (ref 65–99)
GLUCOSE: 90 mg/dL (ref 65–99)
POTASSIUM: 3.3 mmol/L — AB (ref 3.5–5.1)
POTASSIUM: 3.3 mmol/L — AB (ref 3.5–5.1)
Potassium: 3.5 mmol/L (ref 3.5–5.1)
SODIUM: 148 mmol/L — AB (ref 135–145)
Sodium: 148 mmol/L — ABNORMAL HIGH (ref 135–145)
Sodium: 146 mmol/L — ABNORMAL HIGH (ref 135–145)

## 2014-07-21 LAB — CBC WITH DIFFERENTIAL/PLATELET
BASOS ABS: 0 10*3/uL (ref 0.0–0.1)
Basophils Relative: 0 % (ref 0–1)
EOS ABS: 0.1 10*3/uL (ref 0.0–0.7)
Eosinophils Relative: 2 % (ref 0–5)
HEMATOCRIT: 27.1 % — AB (ref 39.0–52.0)
Hemoglobin: 8.3 g/dL — ABNORMAL LOW (ref 13.0–17.0)
Lymphocytes Relative: 6 % — ABNORMAL LOW (ref 12–46)
Lymphs Abs: 0.4 10*3/uL — ABNORMAL LOW (ref 0.7–4.0)
MCH: 31.8 pg (ref 26.0–34.0)
MCHC: 30.6 g/dL (ref 30.0–36.0)
MCV: 103.8 fL — ABNORMAL HIGH (ref 78.0–100.0)
Monocytes Absolute: 0.6 10*3/uL (ref 0.1–1.0)
Monocytes Relative: 8 % (ref 3–12)
Neutro Abs: 6.2 10*3/uL (ref 1.7–7.7)
Neutrophils Relative %: 84 % — ABNORMAL HIGH (ref 43–77)
Platelets: 120 10*3/uL — ABNORMAL LOW (ref 150–400)
RBC: 2.61 MIL/uL — ABNORMAL LOW (ref 4.22–5.81)
RDW: 19.7 % — ABNORMAL HIGH (ref 11.5–15.5)
WBC: 7.3 10*3/uL (ref 4.0–10.5)

## 2014-07-21 LAB — LACTIC ACID, PLASMA
LACTIC ACID, VENOUS: 2.6 mmol/L — AB (ref 0.5–2.0)
Lactic Acid, Venous: 3.1 mmol/L (ref 0.5–2.0)
Lactic Acid, Venous: 2.9 mmol/L (ref 0.5–2.0)
Lactic Acid, Venous: 3.3 mmol/L (ref 0.5–2.0)

## 2014-07-21 LAB — VANCOMYCIN, RANDOM: Vancomycin Rm: 14 ug/mL

## 2014-07-21 LAB — GLUCOSE, CAPILLARY: GLUCOSE-CAPILLARY: 92 mg/dL (ref 65–99)

## 2014-07-21 LAB — STREP PNEUMONIAE URINARY ANTIGEN: STREP PNEUMO URINARY ANTIGEN: NEGATIVE

## 2014-07-21 MED ORDER — BACITRACIN-NEOMYCIN-POLYMYXIN OINTMENT TUBE
TOPICAL_OINTMENT | CUTANEOUS | Status: DC | PRN
  Administered 2014-07-23: 02:00:00 via TOPICAL
  Filled 2014-07-21: qty 15

## 2014-07-21 MED ORDER — SODIUM CHLORIDE 0.9 % IV BOLUS (SEPSIS)
500.0000 mL | Freq: Once | INTRAVENOUS | Status: AC
  Administered 2014-07-21: 500 mL via INTRAVENOUS

## 2014-07-21 MED ORDER — VANCOMYCIN HCL IN DEXTROSE 750-5 MG/150ML-% IV SOLN
750.0000 mg | Freq: Once | INTRAVENOUS | Status: DC
  Filled 2014-07-21: qty 150

## 2014-07-21 MED ORDER — CHLORHEXIDINE GLUCONATE 0.12 % MT SOLN
15.0000 mL | Freq: Two times a day (BID) | OROMUCOSAL | Status: DC
  Administered 2014-07-23: 15 mL via OROMUCOSAL
  Filled 2014-07-21 (×13): qty 15

## 2014-07-21 MED ORDER — VANCOMYCIN HCL IN DEXTROSE 750-5 MG/150ML-% IV SOLN
750.0000 mg | Freq: Two times a day (BID) | INTRAVENOUS | Status: DC
  Administered 2014-07-21: 750 mg via INTRAVENOUS
  Filled 2014-07-21 (×2): qty 150

## 2014-07-21 MED ORDER — METRONIDAZOLE IN NACL 5-0.79 MG/ML-% IV SOLN
500.0000 mg | Freq: Three times a day (TID) | INTRAVENOUS | Status: DC
  Administered 2014-07-21 – 2014-07-22 (×3): 500 mg via INTRAVENOUS
  Filled 2014-07-21 (×5): qty 100

## 2014-07-21 MED ORDER — DIPHENHYDRAMINE HCL 50 MG/ML IJ SOLN: 12.5000 mg | Freq: Three times a day (TID) | INTRAMUSCULAR | Status: DC | PRN

## 2014-07-21 MED ORDER — CETYLPYRIDINIUM CHLORIDE 0.05 % MT LIQD
7.0000 mL | Freq: Two times a day (BID) | OROMUCOSAL | Status: DC
  Administered 2014-07-21 – 2014-07-23 (×4): 7 mL via OROMUCOSAL

## 2014-07-21 NOTE — Procedures (Signed)
PT taken off BIPAP and placed on [place] @4L . RT will monitor pt and placed pt on BIPAP if needed or requested.

## 2014-07-21 NOTE — Progress Notes (Signed)
CRITICAL VALUE ALERT  Critical value received: Lactic Acid 2.6  Date of notification:  07/21/2014  Time of notification:  1320  Critical value read back:yes  Nurse who received alert:  Austin Miles  MD notified (1st page):  Wynelle Cleveland, S   Time of first page:  1321  MD notified (2nd page):  Time of second page:  Responding MD:  Reggy Eye  Time MD responded:  [phone]

## 2014-07-21 NOTE — Progress Notes (Signed)
[facility]  Patient examined and, H and P reviewed and discussed plan with patient's legal guardian/Mother. The patient's guardian states that 2 nights ago his abdomen became severely distended. At this point she decided to stop his tube feeds but had already given him his medications. She put his C Pap on and had to turn the oxygen level up to 100% to keep his saturation greater than 90%. In the morning when she took his C Pap off, his mouth was covered and orange solution. She states the only thing orange that she gave to him the night before was potassium and she feels that having the potassium in his mouth and requiring increased oxygen was a sign that he aspirated overnight.  Principal Problem:   Aspiration pneumonia/tachycardia/lactic acidosis-sepsis -Recently had an episode of aspiration-appears to have recurred 2 nights ago resulting in increased O2 requirements-the patient was previously requiring 1-2 L of oxygen at night with C Pap-after the suspected aspiration he was on 100% oxygen through the night.-EC vancomycin and continue Zosyn -Continue to follow in step down unit with intermittent BiPAP use -Continue IV Flagyl  Active Problems:   Neuromuscular scoliosis -Bedbound and has PEG tube  Hypernatremia -Continue D5 half-normal saline-holding PEG tube feeds for now  Hypokalemia - adequately replaced  Peg tube- possible ileus -According to the legal guardian, she feels that the tube is not working and she would like it to be evaluated-have requested an IR consult -X-ray suggestive of ileus however the patient does have good bowel sounds and abdomen is no longer distended-if PEG tube is working appropriately, will resume tube feeds  Generalized convulsive epilepsy    OSA (obstructive sleep apnea) -Continue BiPAP for now    Cerebral palsy, quadriplegic Total care at home by legal guardian   Debbe Odea, MD

## 2014-07-21 NOTE — Progress Notes (Signed)
IR PA aware of request for conversion of G-tube to G-J tube with new aspiration. Imaging reviewed with Dr. Barbie Banner and patient has a special G-tube that would require IR to order a specific J connector to fit into his existing G-tube. Patient with possible ileus which may be the underlying cause, will need to discuss with IR techs this week regarding J tube connector for this specific G-tube and with family regarding the different options in this setting of possible ileus.   Tsosie Billing PA-C Interventional Radiology  07/21/14  11:06 AM

## 2014-07-21 NOTE — Progress Notes (Signed)
Utilization review completed.  

## 2014-07-21 NOTE — Progress Notes (Signed)
CRITICAL VALUE ALERT  Critical value received:  Lactic Acid 2.9  Date of notification:  07/21/2014   Time of notification:  0610  Critical value read back: yes  Nurse who received alert:  Cheryll Cockayne   MD notified (1st page):  Fredirick Maudlin  Time of first page:  0615  MD notified (2nd page):  Time of second page:   Responding MD:  Fredirick Maudlin  Time MD responded:  [phone]

## 2014-07-21 NOTE — Procedures (Signed)
Pt placed on hospital BIPAP set to 12/7 per home setting per mom at bedside.  Pt is comfortable and resting.  RT will continue to monitor pt throughout the night.

## 2014-07-21 NOTE — Progress Notes (Signed)
CRITICAL VALUE ALERT  Critical value received:  Lactic Acid 4.6  Date of notification:  6/18   Time of notification:  2300  Critical value read back:  yes  Nurse who received alert:  Karl Luke  MD notified (1st page):  Fredirick Maudlin  Time of first page:  2355  MD notified (2nd page):  Time of second page:  Responding MD:  Fredirick Maudlin  Time MD responded:  [phone]

## 2014-07-21 NOTE — Progress Notes (Signed)
CRITICAL VALUE ALERT  Critical value received:  Lactic Acid 3.1  Date of notification:  07/21/2014  Time of notification:  0200  Critical value read back: yes   Nurse who received alert:  Karl Luke A   MD notified (1st page):  Fredirick Maudlin  Time of first page:  0200  MD notified (2nd page):    Time of second page:  Responding MD:  Fredirick Maudlin  Time MD responded:  [phone]

## 2014-07-21 NOTE — H&P (Signed)
[facility] History and Physical  Patient: Jeffery [name]  MRN: [id number]  DOB: 09/20/1990  DOS: the patient was seen and examined on 07/20/2014 PCP: Marijean Bravo, MD  Referring physician: Dr. Alvino Chapel Chief Complaint: Hypoxia  HPI: Jeffery [name] is a 24 y.o. male with Past medical history of cerebral palsy with quadriplegia, obstructive sleep apnea daily at bedtime CPAP, recurrent aspiration pneumonia status post G-tube placement, scoliosis, chronic possible diastolic dysfunction. The patient is presenting with possible episode of aspiration as well as hypoxia. The patient has a long-standing history of cerebral palsy and has recurrent admissions to the hospital with various issues. Patient was recently hospitalized for sepsis probably secondary to aspiration pneumonia and was treated with Levaquin and discharged home. It was discussed with the family to discuss with their PCP about possible hospice. After going home patient was initially at his baseline but later on started becoming more weak and more lethargic. Last night he was felt more short of breath and his CPAP oxygenation was increased to 100% by his mother who is also an RN to maintain saturations above 90%. This morning when she was doing routine care for the patient she found that the medication that he received from the G-tube was coating patient's mouth with suspicion for possible aspiration. With this the patient was being more and more lethargic and therefore was brought to the hospital. Patient also has distended abdomen since last to 3 days. No diarrhea at present. Distention has improved since his arrival. Patient also has chronic leg swelling which is unchanged or family. Patient has that without a new red rash involving his knee as well as bilateral hand since his arrival.   The patient is coming from home And at his baseline independent for most of his ADL.  Review of Systems: as mentioned in the history  of present illness.  A comprehensive review of the other systems is negative.  Past Medical History  Diagnosis Date  . CP (cerebral palsy), spastic, quadriplegic   . Osteoporosis   . Undescended testes   . Seasonal allergies   . IVH (intraventricular hemorrhage) 09/22/90    Grade IV  . Hip dislocation, bilateral   . Dysphagia   . Retinopathy of prematurity   . 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 Neuromuscular scoliosis   . Osteoporosis   . Complex partial seizures   . Generalized convulsive epilepsy without mention of intractable epilepsy   . Sinus bradycardia     HR drops to 38-40 while sleeping  . Blister of right heel     fluid filled; origin unknown  . Kidney stones     ?  Marland Kitchen Pneumonia      chronic pneumonia ,respitory failure dx Augest 2014  . Aspiration pneumonia     "chronic" (04/12/2014)  . OSA treated with BiPAP     "since age 63"   . Anemia   . 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 GERD (gastroesophageal reflux disease)   . Epilepsy   . Spastic quadriplegia   . Neutropenia 07/03/2014   Past Surgical History  Procedure Laterality Date  . Mole removal  "several B/T 2008-2010"    "from all over"  . Jejunostomy feeding tube  03/08/2013; ~ 09/2013; ~ 01/2014    "transgastric-jujunal feeding tube"  . Gastrostomy tube placement  11./02/1998       . Button change  12/15/2010  Procedure: BUTTON CHANGE;  Surgeon: Gatha Mayer, MD;  Location: [facility];  Service: Endoscopy;  Laterality: N/A;  . Peg placement  10/07/2011    Procedure: PERCUTANEOUS ENDOSCOPIC GASTROSTOMY (PEG) REPLACEMENT;  Surgeon: Lafayette Dragon, MD;  Location: [facility];  Service: Endoscopy;  Laterality: N/A;  Needs 18 F 2.5 button ordered-dl  . Inguinal hernia repair Bilateral 1992  . Retinopathy of prematurity surgery  1992  . Achilles tendon lengthening Bilateral 12/1998  . Tendon release  12/1998    Soft tissue releases   wrists and fingers [Other]  . Infusion pump implantation  07/25/2000; 2013    Intrathecal baclofen pump  . Peg placement N/A 06/05/2012    Procedure: PERCUTANEOUS ENDOSCOPIC GASTROSTOMY (PEG) REPLACEMENT;  Surgeon: Lafayette Dragon, MD;  Location: [facility];  Service: Endoscopy;  Laterality: N/A;  button 39fr.2.5cm  . Strabismus surgery Bilateral 1993    "3 on right; 2 on left"  . Back surgery  ~ 2008    Harrington Rods in back needs to be log rolled  . Tendon repair Bilateral 09/05/2012    Procedure: LENGTHENING OF DIGITAL FLEXOR TENDONS BILTERAL HANDS;  Surgeon: Jolyn Nap, MD;  Location: [facility];  Service: Orthopedics;  Laterality: Bilateral;  . Peg placement N/A 09/13/2012    Procedure: PERCUTANEOUS ENDOSCOPIC GASTROSTOMY (PEG) REPLACEMENT;  Surgeon: Lafayette Dragon, MD;  Location: [facility];  Service: Endoscopy;  Laterality: N/A;  . Flexible sigmoidoscopy N/A 10/30/2012    Procedure: FLEXIBLE SIGMOIDOSCOPY;  Surgeon: Cleotis Nipper, MD;  Location: [facility];  Service: Endoscopy;  Laterality: N/A;  . Peg placement N/A 11/15/2012    Procedure: PERCUTANEOUS ENDOSCOPIC GASTROSTOMY (PEG) REPLACEMENT;  Surgeon: Jeryl Columbia, MD;  Location: [facility];  Service: Endoscopy;  Laterality: N/A;  . Gastrostomy tube placement  11./02/1998  . Rectal biopsy  10/29/2012    S/P diarrhea from Vancomycin  . Portacath placement Right 11/25/2012    chest  . Eye surgery     Social History:  reports that he has never smoked. He has never used smokeless tobacco. He reports that he does not drink alcohol or use illicit drugs.  Allergies  Allergen Reactions  . Depakote [Divalproex Sodium]     Causes pancreatitis   . Vimpat [Lacosamide] Rash  . Keppra [Levetiracetam] Other (See Comments)    Bone marrow suppression  . Adhesive [Tape] Other (See Comments)    Rips skin off (paper tape is ok)  . Amoxicillin-Pot Clavulanate Rash    If given with fluconazole rash is not as severe or does not appear at  all  . Neulasta [Pegfilgrastim] Other (See Comments)    Fever, tachycardia    Family History  Problem Relation Age of Onset  . Adopted: Yes    Prior to Admission medications   Medication Sig Start Date End Date Taking? Authorizing Provider  acetaminophen (TYLENOL) 160 MG/5ML solution 480 mg by Gastric Tube route every 6 (six) hours as needed for fever.   Yes Historical Provider, MD  acetaminophen (TYLENOL) 80 MG suppository Place 480 mg rectally every 4 (four) hours as needed for fever.   Yes Historical Provider, MD  albuterol (PROVENTIL HFA;VENTOLIN HFA) 108 (90 BASE) MCG/ACT inhaler Inhale 2 puffs into the lungs every 4 (four) hours as needed for shortness of breath.   Yes Historical Provider, MD  albuterol (PROVENTIL) (2.5 MG/3ML) 0.083% nebulizer solution Take 2.5 mg by nebulization See admin instructions. Give 1 vial (2.5 mg) twice daily (8am and 8pm) and every 4 hours  as needed for shortness of breath or wheezing 10/11/12  Yes Elsie Stain, MD  Amino Acids-Protein Hydrolys (FEEDING SUPPLEMENT, PRO-STAT SUGAR FREE 64,) LIQD Take 30 mLs by mouth 2 (two) times daily. Patient taking differently: 30 mLs by Gastric Tube route 2 (two) times daily. 12p and 5p 06/27/14  Yes Velvet Bathe, MD  baclofen (GABLOFEN) 40000 MCG/20ML SOLN by Intrathecal route continuous. 385.2 mcg in 24 hours 05/04/12  Yes Jodi Geralds, MD  bag balm OINT ointment Apply 1 application topically See admin instructions. Apply to sacral area with each diaper change   Yes Historical Provider, MD  calcium carbonate, dosed in mg elemental calcium, 1250 MG/5ML 1,250 mg by Gastric Tube route 3 (three) times daily. 8am , 2pm, and 8pm   Yes Historical Provider, MD  cloBAZam (ONFI) 2.5 MG/ML solution Place 2.5 mL in gastrostomy tube 3 times daily Patient taking differently: 2.5 mg by Gastric Tube route 3 (three) times daily. 8am, 2pm, 8pm 05/17/14  Yes Jodi Geralds, MD  DIASTAT ACUDIAL 20 MG GEL Place 12.5 mg rectally as  needed (for seizure lasting 2 minutes or longer or repetitive seizures - no more than 1 dose in 12 hours (not given with nasal versed)).  04/01/14  Yes Historical Provider, MD  dicyclomine (BENTYL) 10 MG/5ML syrup 10 mg by Gastric Tube route every 8 (eight) hours as needed (abdominal cramps). Do not exceed 5 days in one week   Yes Historical Provider, MD  diphenoxylate-atropine (LOMOTIL) 2.5-0.025 MG/5ML liquid 10 mLs by Gastric Tube route every 6 (six) hours as needed for diarrhea or loose stools.    Yes Historical Provider, MD  folic acid (FOLVITE) 1 MG tablet 1 mg by Gastric Tube route daily. 8:00am per G tube   Yes Historical Provider, MD  furosemide (LASIX) 10 MG/ML solution 20 mg by Gastric Tube route daily as needed for fluid or edema. Maximum 3 times week   Yes Historical Provider, MD  gabapentin (NEURONTIN) 250 MG/5ML solution Take 18 mL 3 times daily Patient taking differently: Place 900 mg into feeding tube 3 (three) times daily. Give 18 mls (900 mg) at 8am, 2pm, 8pm (for neuropathy) 05/01/14  Yes Jodi Geralds, MD  ibuprofen (ADVIL,MOTRIN) 100 MG/5ML suspension 400 mg by Gastric Tube route every 6 (six) hours as needed for fever (pain).    Yes Historical Provider, MD  ketoconazole (NIZORAL) 2 % cream Apply 1 application topically 2 (two) times daily as needed for irritation (yeast rash from antibiotics).    Yes Historical Provider, MD  lansoprazole (PREVACID SOLUTAB) 30 MG disintegrating tablet 30 mg by Gastric Tube route 2 (two) times daily. 7:30am and 7:30pm 09/07/12  Yes Lafayette Dragon, MD  levofloxacin (LEVAQUIN) 500 MG tablet 500 mg by Gastric Tube route every evening. 5 day course started 07/17/14 5pm   Yes Historical Provider, MD  metoCLOPramide (REGLAN) 5 MG/5ML solution Place 10 mLs (10 mg total) into feeding tube 4 (four) times daily -  before meals and at bedtime. Patient taking differently: 10 mg by Gastric Tube route 4 (four) times daily -  before meals and at bedtime.  07/12/14   Yes Volanda Napoleon, MD  midazolam (VERSED) 5 MG/ML injection Place 2 mLs (10 mg total) into the nose once. Draw up 58ml in 2 syringes. Remove blue vial access device. Attach syringe to nasal atomizer for intranasal administration. Give 34ml in right nostril x 2 for seizures lasting 2 minutes or longer or for repetitive seizures in a short  period of time. Patient taking differently: Place 10 mg into the nose as needed (for seizure lasting 2 minutes or longer or repetitive seizures - no more than 1 dose in 12 hours (if unable to give diastat)). Draw up 66ml in 2 syringes. Remove blue vial access device. Attach syringe to nasal atomizer for intranasal administration. Give 37ml in right nostril x 2 for seizures lasting 2 minutes or longer or for repetitive seizures in a short period of time. 11/05/13  Yes Rockwell Germany, NP  Multiple Vitamin (MULTIVITAMIN) LIQD 10 mLs by Gastric Tube route daily.   Yes Historical Provider, MD  Multiple Vitamins-Minerals (ZINC PO) 5 mLs by Gastric Tube route daily. 244 mg / 5 mL zinc solution compounded by [facility]   Yes Historical Provider, MD  mupirocin ointment (BACTROBAN) 2 % Apply 1 application topically as needed (to G-T site). Patient taking differently: Apply 1 application topically 3 (three) times daily as needed (for redness at the G-T site).  09/07/12  Yes Lafayette Dragon, MD  Nutritional Supplements (TWOCAL HN) LIQD 237 mLs by Gastric Tube route See admin instructions. T.3 can at 46 cc hour x 12 hours - water 172 cc pre and post each and extra 172 bid.   Yes Historical Provider, MD  OnabotulinumtoxinA (BOTOX IJ) Inject 300 Units as directed See admin instructions. Every 3 months   Yes Historical Provider, MD  potassium chloride (KLOR-CON) 20 MEQ packet 20 mEq by Gastric Tube route See admin instructions. Give 20 meq daily except twice daily with lasix   Yes Historical Provider, MD  Pseudoephedrine HCl (SUDAFED CHILDRENS) 15 MG/5ML LIQD 30 mg by Gastric Tube route  3 (three) times daily. 6am, 2pm, 8pm   Yes Historical Provider, MD  simethicone (MYLICON) 419 MG chewable tablet 125 mg by Gastric Tube route See admin instructions. 125mg  caplet added to each can of Two Cal 3 times daily   Yes Historical Provider, MD  sodium phosphate (FLEET) enema Place 1 enema rectally daily as needed (gas buildup/ bloating).    Yes Historical Provider, MD  sucralfate (CARAFATE) 1 GM/10ML suspension 1 g by Gastric Tube route 4 (four) times daily. 7am, 12pm, 5pm, 7pm Administer alone 30 minutes prior to other medications.   Yes Historical Provider, MD  Vitamin Mixture (VITAMIN C) LIQD 300 mLs by Gastric Tube route daily.   Yes Historical Provider, MD  Water For Irrigation, Sterile (FREE WATER) SOLN 172 mLs by Gastric Tube route 4 (four) times daily.    Yes Historical Provider, MD  Zinc Oxide (BALMEX EX) Apply 1 application topically See admin instructions. Apply to sacral with each diaper change   Yes Historical Provider, MD  OXYGEN-HELIUM IN Inhale into the lungs. Oxygen PRN to keep O2 Sat at 90%    Historical Provider, MD    Physical Exam: Filed Vitals:   07/20/14 2048 07/20/14 2100 07/20/14 2155 07/21/14 0015  BP: 126/85 117/69 106/61 118/71  Pulse: 119 118 117 114  Temp:   98.7 F (37.1 C) 99.4 F (37.4 C)  TempSrc:   Axillary Axillary  Resp: 25 21 19 22   Height:   4\' 2"  (1.27 m)   Weight:   59.6 kg (131 lb 6.3 oz)   SpO2: 100% 100% 100% 100%    General: Nonverbal, occasionally follow command, Eyes: PERRL ENT: Oral Mucosa clear. Neck:  difficult to assess  JVD Cardiovascular: S1 and S2 Present, aortic systolic Murmur, Peripheral Pulses Present Respiratory: Bilateral Air entry equal and Decreased,   bilateral  rhonchi and  Crackles, ocational  wheezes Abdomen: Bowel Sound  present, Soft and non tender Skin: Erythematous patch involving both knee as well as upper extremity felt warm to touch  Extremities: bilateral dal edema,  Neurologic: nonverbal, occasionally  follows command, spontaneously moving all extremities   Labs on Admission:  CBC:  Recent Labs Lab 07/14/14 1030 07/15/14 0600 07/16/14 0400 07/20/14 1629  WBC 6.5 3.3* 2.1* 8.3  NEUTROABS  --  3.0 1.5* 7.1  HGB 6.9* 10.1* 10.4* 10.9*  HCT 22.0* 31.8* 32.4* 36.2*  MCV 104.3* 96.4 97.3 105.5*  PLT 111* 112* 134* 135*    CMP     Component Value Date/Time   NA 150* 07/20/2014 2028   NA 140 07/03/2014 1301   K 3.9 07/20/2014 2028   K 3.4 07/03/2014 1301   CL 105 07/20/2014 2028   CL 96* 07/03/2014 1301   CO2 34* 07/20/2014 2028   CO2 32 07/03/2014 1301   GLUCOSE 89 07/20/2014 2028   GLUCOSE 107 07/03/2014 1301   BUN 15 07/20/2014 2028   BUN 17 07/03/2014 1301   CREATININE 1.20 07/20/2014 2028   CREATININE 0.7 07/03/2014 1301   CALCIUM 8.7* 07/20/2014 2028   CALCIUM 9.5 07/03/2014 1301   PROT 6.3* 07/20/2014 1629   PROT 6.5 07/03/2014 1301   ALBUMIN 3.2* 07/20/2014 1629   AST 35 07/20/2014 1629   AST 24 07/03/2014 1301   ALT 160* 07/20/2014 1629   ALT 36 07/03/2014 1301   ALKPHOS 224* 07/20/2014 1629   ALKPHOS 166* 07/03/2014 1301   BILITOT 1.5* 07/20/2014 1629   BILITOT 1.00 07/03/2014 1301   GFRNONAA >60 07/20/2014 2028   GFRAA >60 07/20/2014 2028    No results for input(s): LIPASE, AMYLASE in the last 168 hours.  No results for input(s): CKTOTAL, CKMB, CKMBINDEX, TROPONINI in the last 168 hours. BNP (last 3 results) No results for input(s): BNP in the last 8760 hours.  ProBNP (last 3 results) No results for input(s): PROBNP in the last 8760 hours.   Radiological Exams on Admission: Dg Chest Port 1 View  07/20/2014   CLINICAL DATA:  Respiratory distress, fever.  EXAM: PORTABLE CHEST - 1 VIEW  COMPARISON:  July 13, 2014.  FINDINGS: Harrington rods are again noted. Right-sided Port-A-Cath is unchanged in position. No pneumothorax is noted. Cardiomediastinal silhouette is unchanged compared to prior exam. Left lung is clear. Congenital deformity of right ribs  are noted. Continued elevation of right hemidiaphragm is noted. Some degree of mild right pleural effusion appears to be present. Right basilar atelectasis noted on prior exam is not well visualized presumably due to effusion. New linear opacity is noted in right upper lobe concerning for subsegmental atelectasis.  IMPRESSION: There appears to be interval development of mild right pleural effusion ; underlying basilar atelectasis or infiltrate cannot be excluded. New mild linear opacity is noted in right upper lobe concerning for subsegmental atelectasis.   Electronically Signed   By: Marijo Conception, M.D.   On: 07/20/2014 16:30   Dg Abd Portable 1v  07/20/2014   CLINICAL DATA:  Abdominal distention  EXAM: PORTABLE ABDOMEN - 1 VIEW  COMPARISON:  Jun 21, 2014  FINDINGS: There is mild bowel dilatation in a pattern suggesting ileus or possibly a degree of enteritis. Obstruction is not felt to be likely. No free air is seen on this supine examination. There is extensive postoperative change in the visualized thoracic and lumbar spine regions. A stimulator is seen overlying the right lower quadrant  with the lead extending into the spinal region. Visualized lung bases are clear. There is a small phlebolith in the pelvis. Gastrostomy catheter is seen in the mid abdomen.  IMPRESSION: The bowel gas pattern suggests early ileus or possibly enteritis. Obstruction not felt to be likely. No free air.   Electronically Signed   By: Lowella Grip III M.D.   On: 07/20/2014 16:49   Assessment/Plan Principal Problem:   Aspiration pneumonia Active Problems:   Neuromuscular scoliosis   Generalized convulsive epilepsy   OSA (obstructive sleep apnea)   Cerebral palsy, quadriplegic   Anemia of chronic disease   Sepsis   1. Aspiration pneumonia The patient is presenting with complaints of possible aspiration. Patient is more hypoxic than his baseline. With this the patient has lactic acidosis, tachycardia, possible  source of infection meeting sepsis criteria.  At his baseline he is using C Pap daily at bedtime. The patient has recurrent aspiration pneumonia. With this the patient will be treated broadly with vancomycin and Zosyn. Follow cultures. Nothing by mouth. Switching as much as medication to IV form as possible. BiPAP as needed, although the patient is at risk for aspiration patient is a DNR/DNI therefore will continue using BiPAP.   2. possible antritis. Possible ileus. Patient's abdominal x-ray is also positive for possible antritis. With his history of recurrent admissions for infection with being on antibiotics patient will be treated with Flagyl for suspicious C. difficile. Rule out C. difficile. Although patient recently has been in negative for C. difficile.  3. history of cerebral palsy with seizure disorders. Continuing patient's home medication secondary to his multiple allergies.  4.Anemia with myelosuppression. Continuing close monitoring of his H&H. Recently received Neupogen during last admission. Continue close monitoring.  5. hypernatremia. Possibly secondary to dehydration from sepsis. Continue BMP every 2 hours.  6. Skin redness. Possible flushing from vancomycin versus cellulitis. The cellulitis will be called with antibiotics. Recommend to reduce the rate to vancomycin infusion. When necessary Benadryl and continuing Pepcid.  Advance goals of care discussion: DNR/DNI as per my discussion with patient's mother who is the power of attorney and primary Provider.  DVT Prophylaxis: mechanical compression device  Nutrition: npo  Family Communication: family was present at bedside, opportunity was given to ask question and all questions were answered satisfactorily at the time of interview. Disposition: Admitted as inpatient step-down unit.  Author: Berle Mull, MD [facility] Pager: [phone]  If 7PM-7AM, please contact  night-coverage [email or web address] Password [email or web address]

## 2014-07-21 NOTE — Progress Notes (Signed)
ANTIBIOTIC CONSULT NOTE - Follow-up  Pharmacy Consult for Vancomycin + Zosyn Indication: rule out sepsis  Allergies  Allergen Reactions  . Depakote [Divalproex Sodium]     Causes pancreatitis   . Vimpat [Lacosamide] Rash  . Keppra [Levetiracetam] Other (See Comments)    Bone marrow suppression  . Adhesive [Tape] Other (See Comments)    Rips skin off (paper tape is ok)  . Amoxicillin-Pot Clavulanate Rash    If given with fluconazole rash is not as severe or does not appear at all  . Neulasta [Pegfilgrastim] Other (See Comments)    Fever, tachycardia    Patient Measurements: Height: 4\' 2"  (127 cm) Weight: 132 lb 4.4 oz (60 kg) IBW/kg (Calculated) : 27 Wt: 58 kg Ht: 50 inches  Vital Signs: Temp: 98.6 F (37 C) (06/19 0533) Temp Source: Oral (06/19 0533) BP: 106/52 mmHg (06/19 0533) Pulse Rate: 98 (06/19 0533) Intake/Output from previous day: 06/18 0701 - 06/19 0700 In: -  Out: 650 [Urine:650] Intake/Output from this shift: Total I/O In: -  Out: 650 [Urine:650]  Labs:  Recent Labs  07/20/14 1629 07/20/14 2028 07/21/14 0130 07/21/14 0515  WBC 8.3  --  7.3  --   HGB 10.9*  --  8.3*  --   PLT 135*  --  120*  --   CREATININE 1.18 1.20 1.05 1.06   Estimated Creatinine Clearance: 61.6 mL/min (by C-G formula based on Cr of 1.06).  Recent Labs  07/21/14 0515  [facility] 14     Microbiology: Recent Results (from the past 720 hour(s))  Clostridium Difficile by PCR     Status: None   Collection Time: 06/23/14 12:01 PM  Result Value Ref Range Status   C difficile by pcr NEGATIVE NEGATIVE Final  Blood Culture (routine x 2)     Status: None   Collection Time: 07/13/14  6:20 AM  Result Value Ref Range Status   Specimen Description BLOOD LEFT HAND  Final   Special Requests BOTTLES DRAWN AEROBIC ONLY 3CC  Final   Culture   Final    NO GROWTH 5 DAYS Performed at [facility]    Report Status 07/19/2014 FINAL  Final  Blood Culture (routine x 2)      Status: None   Collection Time: 07/13/14  6:25 AM  Result Value Ref Range Status   Specimen Description BLOOD RIGHT ARM  Final   Special Requests BOTTLES DRAWN AEROBIC AND ANAEROBIC 10CC  Final   Culture   Final    NO GROWTH 5 DAYS Performed at [facility]    Report Status 07/19/2014 FINAL  Final  Urine culture     Status: None   Collection Time: 07/13/14  7:00 AM  Result Value Ref Range Status   Specimen Description URINE, CLEAN CATCH  Final   Special Requests NONE  Final   Colony Count   Final    50,000 COLONIES/ML Performed at [facility]    Culture   Final    KLEBSIELLA PNEUMONIAE Performed at [facility]    Report Status 07/15/2014 FINAL  Final   Organism ID, Bacteria KLEBSIELLA PNEUMONIAE  Final      Susceptibility   Klebsiella pneumoniae - MIC*    AMPICILLIN >=32 RESISTANT Resistant     CEFAZOLIN <=4 SENSITIVE Sensitive     CEFTRIAXONE <=1 SENSITIVE Sensitive     CIPROFLOXACIN <=0.25 SENSITIVE Sensitive     GENTAMICIN <=1 SENSITIVE Sensitive     LEVOFLOXACIN 1 SENSITIVE Sensitive  NITROFURANTOIN 128 RESISTANT Resistant     TOBRAMYCIN <=1 SENSITIVE Sensitive     TRIMETH/SULFA <=20 SENSITIVE Sensitive     PIP/TAZO 16 SENSITIVE Sensitive     * KLEBSIELLA PNEUMONIAE  MRSA PCR Screening     Status: None   Collection Time: 07/13/14  4:05 PM  Result Value Ref Range Status   MRSA by PCR NEGATIVE NEGATIVE Final    Comment:        The GeneXpert MRSA Assay (FDA approved for NASAL specimens only), is one component of a comprehensive MRSA colonization surveillance program. It is not intended to diagnose MRSA infection nor to guide or monitor treatment for MRSA infections.   Clostridium Difficile by PCR (not at [facility])     Status: None   Collection Time: 07/15/14  7:06 AM  Result Value Ref Range Status   C difficile by pcr NEGATIVE NEGATIVE Final  Respiratory virus panel     Status: None   Collection Time: 07/15/14 12:25 PM  Result Value  Ref Range Status   Respiratory Syncytial Virus A Negative Negative Final   Respiratory Syncytial Virus B Negative Negative Final   Influenza A Negative Negative Final   Influenza B Negative Negative Final   Parainfluenza 1 Negative Negative Final   Parainfluenza 2 Negative Negative Final   Parainfluenza 3 Negative Negative Final   Metapneumovirus Negative Negative Final   Rhinovirus Negative Negative Final   Adenovirus Negative Negative Final    Comment: (NOTE) Performed At: [facility] [place], [place] [id number] Lindon Romp MD WU:[id number]     Assessment: 10 YOM with hx cerebral palsy and quadriplegia who presented on 6/18 with fever/lethargy. Vancomycin + Zosyn started for r/o sepsis.   Given the patient's quadriplegia - the patient's renal function is hard to estimate. The patient was known to produce a VT of 16 mcg/ml on a dose of 750 mg IV every 8 hours in May with SCr range of 0.45 - 0.47. Admit SCr = 1.18 indicating some degree of acute kidney injury. SCr down to 1.05 today.  Random Vancomycin level 14 mcg/ml about 12 hours after first dose.  Allergy to Augmentin noted as rash but the patient has tolerated Zosyn in the past.   Goal of Therapy:  Vancomycin trough level 15-20 mcg/ml  Plan:  1. Will start Vancomycin 750mg  IV q12h 2. Check Vancomycin trough at Css 3. Start Zosyn 3.375g IV every 8 hours (infused over 4 hours) 4. Will continue to follow renal function, culture results, LOT, and antibiotic de-escalation plans   Sherlon Handing, PharmD, BCPS Clinical pharmacist, pager [phone] 07/21/2014 6:18 AM

## 2014-07-21 NOTE — Care Management Note (Signed)
Case Management Note  Patient Details  Name: Jeffery [name] MRN: [id number] Date of Birth: 1990-02-23 Subjective/Objective:                 Admitted from home with sepsis. 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 Lives with foster mom, who is primary caregiver. Pt is active with [facility], pt with peg and  uses bipap.   Action/Plan: Return to home when medically stable. CM  to f/u with d/c disposition.  Expected Discharge Date:                  Expected Discharge Plan:  [place]  In-House Referral:     Discharge planning Services  CM Consult  Post Acute Care Choice:    Choice offered to:     DME Arranged:    DME Agency:     HH Arranged:    [place] Agency:  [place] (Pt active with [facility] [facility])  Status of Service:  In process, will continue to follow  Medicare Important Message Given:    Date Medicare IM Given:    Medicare IM give by:    Date Additional Medicare IM Given:    Additional Medicare Important Message give by:     If discussed at [place] of Stay Meetings, dates discussed:    Additional Comments: [name] (mom) [phone] Sharin Mons, RN 07/21/2014, 6:18 PM

## 2014-07-22 ENCOUNTER — Ambulatory Visit: Payer: Medicaid Other

## 2014-07-22 ENCOUNTER — Inpatient Hospital Stay (HOSPITAL_COMMUNITY): Payer: Medicaid Other

## 2014-07-22 DIAGNOSIS — K9413 Enterostomy malfunction: Secondary | ICD-10-CM

## 2014-07-22 LAB — CBC
HCT: 26.4 % — ABNORMAL LOW (ref 39.0–52.0)
HCT: 26.2 % — ABNORMAL LOW (ref 39.0–52.0)
HEMOGLOBIN: 8.1 g/dL — AB (ref 13.0–17.0)
HEMOGLOBIN: 8 g/dL — AB (ref 13.0–17.0)
MCH: 31.6 pg (ref 26.0–34.0)
MCH: 31.4 pg (ref 26.0–34.0)
MCHC: 30.7 g/dL (ref 30.0–36.0)
MCHC: 30.5 g/dL (ref 30.0–36.0)
MCV: 103.1 fL — AB (ref 78.0–100.0)
MCV: 102.7 fL — ABNORMAL HIGH (ref 78.0–100.0)
PLATELETS: 111 10*3/uL — AB (ref 150–400)
PLATELETS: 110 10*3/uL — AB (ref 150–400)
RBC: 2.55 MIL/uL — AB (ref 4.22–5.81)
RBC: 2.56 MIL/uL — ABNORMAL LOW (ref 4.22–5.81)
RDW: 18.6 % — ABNORMAL HIGH (ref 11.5–15.5)
RDW: 18.4 % — ABNORMAL HIGH (ref 11.5–15.5)
WBC: 1.6 10*3/uL — AB (ref 4.0–10.5)
WBC: 1.7 10*3/uL — ABNORMAL LOW (ref 4.0–10.5)

## 2014-07-22 LAB — BASIC METABOLIC PANEL
ANION GAP: 8 (ref 5–15)
BUN: 9 mg/dL (ref 6–20)
CALCIUM: 8.3 mg/dL — AB (ref 8.9–10.3)
CHLORIDE: 102 mmol/L (ref 101–111)
CO2: 33 mmol/L — ABNORMAL HIGH (ref 22–32)
Creatinine, Ser: 1.06 mg/dL (ref 0.61–1.24)
GFR calc non Af Amer: >60 mL/min (ref >60)
Glucose, Bld: 86 mg/dL (ref 65–99)
Potassium: 3.1 mmol/L — ABNORMAL LOW (ref 3.5–5.1)
Sodium: 143 mmol/L (ref 135–145)

## 2014-07-22 LAB — URINE CULTURE: CULTURE: NO GROWTH

## 2014-07-22 LAB — LEGIONELLA ANTIGEN, URINE

## 2014-07-22 LAB — CLOSTRIDIUM DIFFICILE BY PCR: Toxigenic C. Difficile by PCR: NEGATIVE

## 2014-07-22 LAB — LACTIC ACID, PLASMA: LACTIC ACID, VENOUS: 1.7 mmol/L (ref 0.5–2.0)

## 2014-07-22 MED ORDER — SIMETHICONE 80 MG PO CHEW: 125.0000 mg | CHEWABLE_TABLET | ORAL | Status: DC

## 2014-07-22 MED ORDER — SUCRALFATE 1 GM/10ML PO SUSP
1.0000 g | Freq: Three times a day (TID) | ORAL | Status: DC
  Administered 2014-07-22 – 2014-07-26 (×12): 1 g via ORAL
  Filled 2014-07-22 (×19): qty 10

## 2014-07-22 MED ORDER — KETOROLAC TROMETHAMINE 30 MG/ML IJ SOLN
30.0000 mg | Freq: Four times a day (QID) | INTRAMUSCULAR | Status: DC | PRN
  Administered 2014-07-22: 30 mg via INTRAVENOUS
  Filled 2014-07-22: qty 1

## 2014-07-22 MED ORDER — SODIUM CHLORIDE 0.45 % IV SOLN: INTRAVENOUS | Status: DC

## 2014-07-22 MED ORDER — METOCLOPRAMIDE HCL 5 MG/5ML PO SOLN
10.0000 mg | Freq: Four times a day (QID) | ORAL | Status: DC
  Administered 2014-07-22 – 2014-07-26 (×12): 10 mg
  Filled 2014-07-22 (×18): qty 10

## 2014-07-22 MED ORDER — BACITRACIN-NEOMYCIN-POLYMYXIN OINTMENT TUBE
TOPICAL_OINTMENT | Freq: Three times a day (TID) | CUTANEOUS | Status: DC
  Administered 2014-07-22 – 2014-07-25 (×10): via TOPICAL
  Filled 2014-07-22: qty 15

## 2014-07-22 MED ORDER — DEXTROSE-NACL 5-0.45 % IV SOLN
INTRAVENOUS | Status: DC
  Administered 2014-07-22 – 2014-07-24 (×2): via INTRAVENOUS

## 2014-07-22 MED ORDER — POTASSIUM CHLORIDE 20 MEQ/15ML (10%) PO SOLN
40.0000 meq | Freq: Once | ORAL | Status: AC
  Administered 2014-07-22: 40 meq via ORAL
  Filled 2014-07-22: qty 30

## 2014-07-22 MED ORDER — LIDOCAINE VISCOUS 2 % MT SOLN
OROMUCOSAL | Status: AC
  Filled 2014-07-22: qty 15

## 2014-07-22 MED ORDER — TBO-FILGRASTIM 480 MCG/0.8ML ~~LOC~~ SOSY
480.0000 ug | PREFILLED_SYRINGE | Freq: Once | SUBCUTANEOUS | Status: AC
  Administered 2014-07-22: 480 ug via SUBCUTANEOUS
  Filled 2014-07-22: qty 0.8

## 2014-07-22 MED ORDER — IOHEXOL 300 MG/ML  SOLN
50.0000 mL | Freq: Once | INTRAMUSCULAR | Status: AC | PRN
  Administered 2014-07-22: 10 mL

## 2014-07-22 MED ORDER — FREE WATER: 172.0000 mL | Freq: Four times a day (QID) | Status: DC

## 2014-07-22 MED ORDER — SIMETHICONE 40 MG/0.6ML PO SUSP
80.0000 mg | Freq: Four times a day (QID) | ORAL | Status: DC
  Administered 2014-07-22 – 2014-07-25 (×12): 80 mg via ORAL
  Filled 2014-07-22 (×18): qty 1.2

## 2014-07-22 NOTE — Progress Notes (Signed)
Physician notified: Rizwan At: 47    Regarding: WBC redraw 1.7 Awaiting return response.   Returned Response at: 1831  Order(s): Neupogen 427micrograms, once, SQ. AM BMET and CBC

## 2014-07-22 NOTE — Progress Notes (Signed)
Physician notified: Rizwan At: 1800  Regarding: order clarification regarding TF start.  Orders: Start TWOCAL HN at 65ml/hr, increase by 56ml every 4 hours as long as residual is <136ml. Add simethicone to TF per [place] home regimen.  Keep HOB at 40 degrees or patient in wheelchair for feedings. Stop TF at night for BiPAP use.

## 2014-07-22 NOTE — Progress Notes (Signed)
Per Dr Wynelle Cleveland, only use Bipap (12/7) with sleep at night. Pt to be on [place] during day. RN to inform parents. Per Dr Wynelle Cleveland, pt ok to be on bipap for 1 hour now, then off till sleep tonight.

## 2014-07-22 NOTE — Progress Notes (Addendum)
Upon discharge pt will need a face to face, and resumption order for [facility] from MD. Whitman Hero RN, BSN, [place]

## 2014-07-22 NOTE — Progress Notes (Signed)
Initial Nutrition Assessment   INTERVENTION:   Resume home TF regimen when able: Two Cal HN at 46 ml/h x 12 hours via J-port of G-J tube for a total of 2.3 cans of Two Cal HN per day.   Promod or Prostat 30 ml BID.  180 ml free water flushes 6 times per day.   NUTRITION DIAGNOSIS:  Inadequate oral intake related to inability to eat, chronic illness as evidenced by NPO status.  GOAL:  Patient will meet greater than or equal to 90% of their needs  MONITOR:  TF tolerance, Weight trends, Labs  REASON FOR ASSESSMENT:  Low [place]    ASSESSMENT:  Patient familiar from previous admissions. He was just d/c'ed home on 6/14 last week. This admission is for sepsis, ? Aspiration. Past medical history of cerebral palsy with quadriplegia, obstructive sleep apnea (bedtime CPAP), recurrent aspiration pneumonia status post GJ-tube placement, scoliosis.  At home, patient receives Two Cal HN at 46 ml/hr x 12 hours via J-port of G-J tube for a total of 2.3 cans of Two Cal HN per day. Also recieves Promod liquid protein 30 ml BID and 180 ml free water flushes 6 times per day. This provides ~1300 kcals, 66 gm protein, 1462 ml free water total per day.   Per discussion with RN, patient is having G-J tube replaced today with plans to resume TF this afternoon.  Height:  Ht Readings from Last 1 Encounters:  07/20/14 4\' 2"  (1.27 m)    Weight:  Wt Readings from Last 1 Encounters:  07/22/14 129 lb 3 oz (58.6 kg)    Ideal Body Weight:  46.8 kg  Wt Readings from Last 10 Encounters:  07/22/14 129 lb 3 oz (58.6 kg)  07/14/14 127 lb 13.9 oz (58 kg)  06/27/14 136 lb (61.689 kg)  06/03/14 124 lb (56.246 kg)  05/11/14 122 lb 5.7 oz (55.5 kg)  04/13/14 127 lb (57.607 kg)  04/03/14 129 lb (58.514 kg)  03/29/14 124 lb 6.4 oz (56.427 kg)  03/02/14 134 lb 0.6 oz (60.8 kg)  12/12/13 128 lb (58.06 kg)    BMI:  Body mass index is 36.33 kg/(m^2).  Estimated Nutritional Needs:  Kcal:   1450-1550  Protein:  65-80 gm  Fluid:  >/= 1.5 L  Skin:  Reviewed, no issues  Diet Order:  Diet NPO time specified  EDUCATION NEEDS:  No education needs identified at this time   Intake/Output Summary (Last 24 hours) at 07/22/14 1618 Last data filed at 07/22/14 1400  Gross per 24 hour  Intake   1195 ml  Output    500 ml  Net    695 ml    Last BM:  6/20   Molli Barrows, RD, LDN, [place] Pager [phone] After Hours Pager [phone]

## 2014-07-22 NOTE — Progress Notes (Signed)
CM went to pt's room to discuss discharge planning/concerns with mom.  Mom had gone home to rest ,not available. Dad(Herman) in room with pt stated mom to be back later this evening. CM provided dad with CM's # [phone].

## 2014-07-22 NOTE — Progress Notes (Addendum)
[facility]  Patient Status: Active with visits up until hospitalization  [name] is providing the following services: SN   If patient discharges after hours, please call [phone].   Jeffery [name] 07/22/2014, 10:13 AM

## 2014-07-22 NOTE — Procedures (Signed)
Interventional Radiology Procedure Note  Procedure: Fluoro guided exchange of G-J tube.  New Mic-Key type GJ placed Complications: None Recommendations:    - Routine care   Signed,  Dulcy Fanny. Earleen Newport, DO

## 2014-07-22 NOTE — Progress Notes (Addendum)
[facility] Progress Note   Jeffery [name] ZYS:[id number] DOB: 11/25/1990 DOA: 07/20/2014 PCP: Marijean Bravo, MD  Brief narrative: Jeffery [name] is a 24 y.o. 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 The patient's guardian states that 2 nights prior to admission his abdomen became severely distended. At this point she decided to stop his tube feeds but had already given him his medications. She put his C Pap on and had to turned the oxygen level up to 100% to keep his saturation greater than 90%. In the morning when she took his C Pap off, his mouth was covered and orange solution. She states the only thing orange that she gave to him the night before was potassium and she feels that having the potassium in his mouth and requiring increased oxygen was a sign that he aspirated overnight. He was admitted on 5/18 for neutropenic fever-neutropenia/pancytopenia was thought to be secondary to Keppra-source for fever was not found He was admitted again on 6/11 with a fever of 103-he was thought to have aspirated. Chest x-ray revealed a right lower lobe "discoid atelectasis" without any other infiltrates. He was discharged home on Levaquin on 6/14.  Subjective: Patient is nonverbal  Assessment/Plan: Aspiration pneumonia? tachycardia/lactic acidosis-sepsis -the patient was previously requiring 1-2 L of oxygen at night with C Pap-after the suspected aspiration he was on 100% oxygen through the night- currently on 30 % with pulse ox of 99% -Still no clear infiltrates noted on chest x-ray  -No fevers until today which is 100.9  - no other source of sepsis found -Blood cultures negative -Stool for C. difficile negative -UA negative  -Continue to follow in step down unit with intermittent BiPAP use -Continue IV  Zosyn  Leukopenia - WB Count dropped to 1.6  from normal-  repeat stat-   Thrombocytopenia -  platelets slightly low since admission - possibly related to sepsis- following - cont heparin for now  Cerebral palsy -Bedbound and has J tube- at baseline, is able to interact with family via facial gestures   Hypernatremia - sodium improving- cont D51/2 NS for now  Hypokalemia - adequately replaced  J tube distended - abdomen -According to the legal guardian, she feels that the tube is not working and she would like it to be evaluated-have requested an IR consult -X-ray suggestive of ileus however the patient does have good bowel sounds- -J tube replaced today- will resume tube feeds at a slow rate and increase to goal rate  Generalized convulsive epilepsy -Intolerant of a number of medications -Taking Clobazam  currently   OSA (obstructive sleep apnea) -Continue BiPAP at night- settings 7 and 12.    Cerebral palsy, quadriplegic Total care at home by legal guardian    Code Status: DO NOT RESUSCITATE Family Communication: With legal guardians Disposition Plan: Follow in step down unit DVT prophylaxis: Heparin Consultants: Procedures:  Antibiotics: Anti-infectives    Start     Dose/Rate Route Frequency Ordered Stop   07/21/14 1400  metroNIDAZOLE (FLAGYL) IVPB 500 mg  Status:  Discontinued     500 mg 100 mL/hr over 60 Minutes Intravenous Every 8 hours 07/21/14 1246 07/22/14 1224   07/21/14 0800  vancomycin (VANCOCIN) IVPB 750 mg/150 ml premix  Status:  Discontinued     750 mg 150 mL/hr over 60 Minutes Intravenous  Once 07/21/14 0617 07/21/14 0618   07/21/14 0800  vancomycin (VANCOCIN) IVPB 750 mg/150 ml premix  Status:  Discontinued  750 mg 150 mL/hr over 60 Minutes Intravenous Every 12 hours 07/21/14 0618 07/21/14 1024   07/21/14 0100  piperacillin-tazobactam (ZOSYN) IVPB 3.375 g     3.375 g 12.5 mL/hr over 240 Minutes Intravenous Every 8 hours 07/20/14 2004     07/20/14 2000  metroNIDAZOLE (FLAGYL) IVPB 500 mg  Status:  Discontinued     500 mg 100 mL/hr over  60 Minutes Intravenous Every 8 hours 07/20/14 1955 07/21/14 1246   07/20/14 1700  vancomycin (VANCOCIN) IVPB 1000 mg/200 mL premix     1,000 mg 200 mL/hr over 60 Minutes Intravenous  Once 07/20/14 1657 07/20/14 2052   07/20/14 1630  piperacillin-tazobactam (ZOSYN) IVPB 3.375 g     3.375 g 100 mL/hr over 30 Minutes Intravenous  Once 07/20/14 1616 07/20/14 1730      Objective: Filed Weights   07/20/14 2155 07/21/14 0533 07/22/14 0500  Weight: 59.6 kg (131 lb 6.3 oz) 60 kg (132 lb 4.4 oz) 58.6 kg (129 lb 3 oz)    Intake/Output Summary (Last 24 hours) at 07/22/14 1225 Last data filed at 07/22/14 0900  Gross per 24 hour  Intake   1510 ml  Output   1300 ml  Net    210 ml     Vitals Filed Vitals:   07/22/14 0745 07/22/14 1129 07/22/14 1207 07/22/14 1211  BP:   123/74   Pulse:  93 97   Temp: 99.7 F (37.6 C)   100.9 F (38.3 C)  TempSrc: Axillary   Axillary  Resp:  14 20   Height:      Weight:      SpO2:  98% 99%     Exam:  General:  Pt is alert, not in acute distress  HEENT: No icterus, No thrush, oral mucosa moist  Cardiovascular: regular rate and rhythm, S1/S2 No murmur  Respiratory: clear to auscultation bilaterally   Abdomen: Soft, +Bowel sounds, non tender, non distended, no guarding  MSK: No LE edema, cyanosis or clubbing  Data Reviewed: Basic Metabolic Panel:  Recent Labs Lab 07/20/14 1629 07/20/14 2028 07/21/14 0130 07/21/14 0515 07/21/14 0854  NA 151* 150* 148* 148* 146*  K 3.5 3.9 3.3* 3.3* 3.5  CL 105 105 106 107 106  CO2 34* 34* 34* 33* 33*  GLUCOSE 85 89 85 88 90  BUN 17 15 13 13 12   CREATININE 1.18 1.20 1.05 1.06 1.04  CALCIUM 9.5 8.7* 8.6* 8.4* 8.5*   Liver Function Tests:  Recent Labs Lab 07/20/14 1629  AST 35  ALT 160*  ALKPHOS 224*  BILITOT 1.5*  PROT 6.3*  ALBUMIN 3.2*   No results for input(s): LIPASE, AMYLASE in the last 168 hours. No results for input(s): AMMONIA in the last 168 hours. CBC:  Recent Labs Lab  07/16/14 0400 07/20/14 1629 07/21/14 0130  WBC 2.1* 8.3 7.3  NEUTROABS 1.5* 7.1 6.2  HGB 10.4* 10.9* 8.3*  HCT 32.4* 36.2* 27.1*  MCV 97.3 105.5* 103.8*  PLT 134* 135* 120*   Cardiac Enzymes: No results for input(s): CKTOTAL, CKMB, CKMBINDEX, TROPONINI in the last 168 hours. BNP (last 3 results) No results for input(s): BNP in the last 8760 hours.  ProBNP (last 3 results) No results for input(s): PROBNP in the last 8760 hours.  CBG:  Recent Labs Lab 07/16/14 0037 07/16/14 0555 07/16/14 0850 07/16/14 1226 07/21/14 0021  GLUCAP 122* 143* 94 121* 92    Recent Results (from the past 240 hour(s))  Blood Culture (routine x 2)  Status: None   Collection Time: 07/13/14  6:20 AM  Result Value Ref Range Status   Specimen Description BLOOD LEFT HAND  Final   Special Requests BOTTLES DRAWN AEROBIC ONLY 3CC  Final   Culture   Final    NO GROWTH 5 DAYS Performed at [facility]    Report Status 07/19/2014 FINAL  Final  Blood Culture (routine x 2)     Status: None   Collection Time: 07/13/14  6:25 AM  Result Value Ref Range Status   Specimen Description BLOOD RIGHT ARM  Final   Special Requests BOTTLES DRAWN AEROBIC AND ANAEROBIC 10CC  Final   Culture   Final    NO GROWTH 5 DAYS Performed at [facility]    Report Status 07/19/2014 FINAL  Final  Urine culture     Status: None   Collection Time: 07/13/14  7:00 AM  Result Value Ref Range Status   Specimen Description URINE, CLEAN CATCH  Final   Special Requests NONE  Final   Colony Count   Final    50,000 COLONIES/ML Performed at [facility]    Culture   Final    KLEBSIELLA PNEUMONIAE Performed at [facility]    Report Status 07/15/2014 FINAL  Final   Organism ID, Bacteria KLEBSIELLA PNEUMONIAE  Final      Susceptibility   Klebsiella pneumoniae - MIC*    AMPICILLIN >=32 RESISTANT Resistant     CEFAZOLIN <=4 SENSITIVE Sensitive     CEFTRIAXONE <=1 SENSITIVE Sensitive      CIPROFLOXACIN <=0.25 SENSITIVE Sensitive     GENTAMICIN <=1 SENSITIVE Sensitive     LEVOFLOXACIN 1 SENSITIVE Sensitive     NITROFURANTOIN 128 RESISTANT Resistant     TOBRAMYCIN <=1 SENSITIVE Sensitive     TRIMETH/SULFA <=20 SENSITIVE Sensitive     PIP/TAZO 16 SENSITIVE Sensitive     * KLEBSIELLA PNEUMONIAE  MRSA PCR Screening     Status: None   Collection Time: 07/13/14  4:05 PM  Result Value Ref Range Status   MRSA by PCR NEGATIVE NEGATIVE Final    Comment:        The GeneXpert MRSA Assay (FDA approved for NASAL specimens only), is one component of a comprehensive MRSA colonization surveillance program. It is not intended to diagnose MRSA infection nor to guide or monitor treatment for MRSA infections.   Clostridium Difficile by PCR (not at [facility])     Status: None   Collection Time: 07/15/14  7:06 AM  Result Value Ref Range Status   C difficile by pcr NEGATIVE NEGATIVE Final  Respiratory virus panel     Status: None   Collection Time: 07/15/14 12:25 PM  Result Value Ref Range Status   Respiratory Syncytial Virus A Negative Negative Final   Respiratory Syncytial Virus B Negative Negative Final   Influenza A Negative Negative Final   Influenza B Negative Negative Final   Parainfluenza 1 Negative Negative Final   Parainfluenza 2 Negative Negative Final   Parainfluenza 3 Negative Negative Final   Metapneumovirus Negative Negative Final   Rhinovirus Negative Negative Final   Adenovirus Negative Negative Final    Comment: (NOTE) Performed At: [facility] [address] [place], [place] [id number] Lindon Romp MD KD:[id number]   Blood Culture (routine x 2)     Status: None (Preliminary result)   Collection Time: 07/20/14  4:25 PM  Result Value Ref Range Status   Specimen Description BLOOD LEFT ARM  Final  Special Requests   Final    BOTTLES DRAWN AEROBIC AND ANAEROBIC 5CC BLUE 3CC RED   Culture NO GROWTH < 24 HOURS  Final   Report Status PENDING  Incomplete   Blood Culture (routine x 2)     Status: None (Preliminary result)   Collection Time: 07/20/14  4:32 PM  Result Value Ref Range Status   Specimen Description BLOOD LEFT HAND  Final   Special Requests BOTTLES DRAWN AEROBIC ONLY 5CC  Final   Culture NO GROWTH < 24 HOURS  Final   Report Status PENDING  Incomplete  Urine culture     Status: None   Collection Time: 07/20/14  4:41 PM  Result Value Ref Range Status   Specimen Description URINE, RANDOM  Final   Special Requests NONE  Final   Culture NO GROWTH 2 DAYS  Final   Report Status 07/22/2014 FINAL  Final  Clostridium Difficile by PCR (not at [facility])     Status: None   Collection Time: 07/22/14  6:00 AM  Result Value Ref Range Status   C difficile by pcr NEGATIVE NEGATIVE Final     Studies: Dg Chest Port 1 View  07/22/2014   CLINICAL DATA:  Hypoxia.  EXAM: PORTABLE CHEST - 1 VIEW  COMPARISON:  07/20/2014  FINDINGS: Right-sided Port-A-Cath remains in place with tip overlying the lower SVC. Cardiac silhouette is within normal limits for size. There is improved aeration of the right lower lung with minimal residual opacity in the right midlung, likely subsegmental atelectasis. Mild left basilar opacity persists, also most suggestive of atelectasis. No sizable pleural effusion or pneumothorax is identified. Posterior spinal fusion rods are again noted.  IMPRESSION: Improved aeration of the right lung base. Mild left basilar atelectasis.   Electronically Signed   By: Logan Bores   On: 07/22/2014 07:50   Dg Chest Port 1 View  07/20/2014   CLINICAL DATA:  Respiratory distress, fever.  EXAM: PORTABLE CHEST - 1 VIEW  COMPARISON:  July 13, 2014.  FINDINGS: Harrington rods are again noted. Right-sided Port-A-Cath is unchanged in position. No pneumothorax is noted. Cardiomediastinal silhouette is unchanged compared to prior exam. Left lung is clear. 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 Continued elevation of right hemidiaphragm is noted. Some  degree of mild right pleural effusion appears to be present. Right basilar atelectasis noted on prior exam is not well visualized presumably due to effusion. New linear opacity is noted in right upper lobe concerning for subsegmental atelectasis.  IMPRESSION: There appears to be interval development of mild right pleural effusion ; underlying basilar atelectasis or infiltrate cannot be excluded. New mild linear opacity is noted in right upper lobe concerning for subsegmental atelectasis.   Electronically Signed   By: Marijo Conception, M.D.   On: 07/20/2014 16:30   Dg Abd Portable 1v  07/21/2014   CLINICAL DATA:  Abdominal distention  EXAM: PORTABLE ABDOMEN - 1 VIEW  COMPARISON:  None.  FINDINGS: Medication pump projects over the right lower quadrant. Harrington fusion rods. Bony deformity of the thorax, spine, and pelvis reidentified. Mild gaseous distention of multiple loops of bowel over the abdomen noted diffusely, not significantly changed. Linear right lower lobe scarring or atelectasis. Presence or absence of air-fluid levels or free air is suboptimally evaluated on this supine projection. Feeding tube suboptimally visualized over the mid abdomen.  IMPRESSION: Mild gaseous distention of multiple loops of bowel over the mid abdomen which is nonspecific but could represent ileus.   Electronically  Signed   By: Conchita Paris M.D.   On: 07/21/2014 11:14   Dg Abd Portable 1v  07/20/2014   CLINICAL DATA:  Abdominal distention  EXAM: PORTABLE ABDOMEN - 1 VIEW  COMPARISON:  Jun 21, 2014  FINDINGS: There is mild bowel dilatation in a pattern suggesting ileus or possibly a degree of enteritis. Obstruction is not felt to be likely. No free air is seen on this supine examination. There is extensive postoperative change in the visualized thoracic and lumbar spine regions. A stimulator is seen overlying the right lower quadrant with the lead extending into the spinal region. Visualized lung bases are clear. There is a  small phlebolith in the pelvis. Gastrostomy catheter is seen in the mid abdomen.  IMPRESSION: The bowel gas pattern suggests early ileus or possibly enteritis. Obstruction not felt to be likely. No free air.   Electronically Signed   By: Lowella Grip III M.D.   On: 07/20/2014 16:49    Scheduled Meds:  Scheduled Meds: . albuterol  2.5 mg Nebulization Q6H  . antiseptic oral rinse  7 mL Mouth Rinse q12n4p  . chlorhexidine  15 mL Mouth Rinse BID  . cloBAZam  2.5 mg Per Tube TID  . gabapentin  900 mg Per Tube TID  . heparin  5,000 Units Subcutaneous 3 times per day  . pantoprazole (PROTONIX) IV  40 mg Intravenous Q12H  . piperacillin-tazobactam (ZOSYN)  IV  3.375 g Intravenous Q8H  . pseudoephedrine  30 mg Per Tube TID   Continuous Infusions: . dextrose 5 % and 0.45% NaCl 75 mL/hr at 07/22/14 8638    Time spent on care of this patient: 35 min   Clyde, MD 07/22/2014, 12:25 PM  LOS: 2 days   [facility] Office  [phone] Pager - Text Page per [email or web address] If 7PM-7AM, please contact night-coverage [email or web address]

## 2014-07-22 NOTE — Plan of Care (Signed)
Problem: Consults Goal: Nutrition Consult-if indicated Outcome: Progressing Pt currently NPO, replacing J/G tube today. Plan to resume nutrition.

## 2014-07-23 ENCOUNTER — Encounter: Payer: Self-pay | Admitting: Hematology & Oncology

## 2014-07-23 DIAGNOSIS — K567 Ileus, unspecified: Secondary | ICD-10-CM

## 2014-07-23 LAB — CBC WITH DIFFERENTIAL/PLATELET
BASOS ABS: 0 10*3/uL (ref 0.0–0.1)
Basophils Relative: 0 % (ref 0–1)
Eosinophils Absolute: 0.1 10*3/uL (ref 0.0–0.7)
Eosinophils Relative: 1 % (ref 0–5)
HEMATOCRIT: 25.6 % — AB (ref 39.0–52.0)
Hemoglobin: 7.8 g/dL — ABNORMAL LOW (ref 13.0–17.0)
LYMPHS PCT: 3 % — AB (ref 12–46)
Lymphs Abs: 0.2 10*3/uL — ABNORMAL LOW (ref 0.7–4.0)
MCH: 31.8 pg (ref 26.0–34.0)
MCHC: 30.5 g/dL (ref 30.0–36.0)
MCV: 104.5 fL — ABNORMAL HIGH (ref 78.0–100.0)
MONO ABS: 0.7 10*3/uL (ref 0.1–1.0)
Monocytes Relative: 9 % (ref 3–12)
Neutro Abs: 7.2 10*3/uL (ref 1.7–7.7)
Neutrophils Relative %: 87 % — ABNORMAL HIGH (ref 43–77)
Platelets: 114 10*3/uL — ABNORMAL LOW (ref 150–400)
RBC: 2.45 MIL/uL — ABNORMAL LOW (ref 4.22–5.81)
RDW: 19.2 % — AB (ref 11.5–15.5)
WBC: 8.2 10*3/uL (ref 4.0–10.5)

## 2014-07-23 LAB — BASIC METABOLIC PANEL
ANION GAP: 7 (ref 5–15)
BUN: 9 mg/dL (ref 6–20)
CO2: 32 mmol/L (ref 22–32)
Calcium: 8.1 mg/dL — ABNORMAL LOW (ref 8.9–10.3)
Chloride: 105 mmol/L (ref 101–111)
Creatinine, Ser: 1.11 mg/dL (ref 0.61–1.24)
GFR calc Af Amer: >60 mL/min (ref >60)
GFR calc non Af Amer: >60 mL/min (ref >60)
Glucose, Bld: 83 mg/dL (ref 65–99)
POTASSIUM: 3 mmol/L — AB (ref 3.5–5.1)
Sodium: 144 mmol/L (ref 135–145)

## 2014-07-23 LAB — PREPARE RBC (CROSSMATCH)

## 2014-07-23 MED ORDER — ACETAMINOPHEN 160 MG/5ML PO SOLN
480.0000 mg | Freq: Four times a day (QID) | ORAL | Status: DC | PRN
  Administered 2014-07-23 – 2014-07-24 (×2): 480 mg via ORAL
  Filled 2014-07-23 (×3): qty 20.3

## 2014-07-23 MED ORDER — SODIUM CHLORIDE 0.9 % IV SOLN: Freq: Once | INTRAVENOUS | Status: DC

## 2014-07-23 MED ORDER — CALCIUM CARBONATE 1250 MG/5ML PO SUSP
1250.0000 mg | Freq: Three times a day (TID) | ORAL | Status: DC
  Administered 2014-07-23 – 2014-07-26 (×6): 1250 mg
  Filled 2014-07-23 (×13): qty 5

## 2014-07-23 MED ORDER — DICYCLOMINE HCL 10 MG/5ML PO SOLN
10.0000 mg | Freq: Once | ORAL | Status: AC
  Administered 2014-07-23: 10 mg via ORAL
  Filled 2014-07-23: qty 5

## 2014-07-23 MED ORDER — DICYCLOMINE HCL 10 MG/5ML PO SOLN
10.0000 mg | Freq: Three times a day (TID) | ORAL | Status: DC | PRN
  Filled 2014-07-23: qty 5

## 2014-07-23 MED ORDER — PSEUDOEPHEDRINE HCL 15 MG/5ML PO LIQD: 30.0000 mg | Freq: Three times a day (TID) | ORAL | Status: DC

## 2014-07-23 MED ORDER — FUROSEMIDE 10 MG/ML IJ SOLN
20.0000 mg | Freq: Once | INTRAMUSCULAR | Status: DC
  Filled 2014-07-23: qty 2

## 2014-07-23 NOTE — Progress Notes (Signed)
[facility] Progress Note   Jeffery [name] OEV:[id number] DOB: [birth year] DOA: 07/20/2014 PCP: Marijean Bravo, MD  Brief narrative: Jeffery [name] is a 24 y.o. 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 The patient's guardian states that 2 nights prior to admission his abdomen became severely distended. At this point she decided to stop his tube feeds but had already given him his medications. She put his C Pap on and had to turned the oxygen level up to 100% to keep his saturation greater than 90%. In the morning when she took his C Pap off, his mouth was covered and orange solution. She states the only thing orange that she gave to him the night before was potassium and she feels that having the potassium in his mouth and requiring increased oxygen was a sign that he aspirated overnight. He was admitted on 5/18 for neutropenic fever-neutropenia/pancytopenia was thought to be secondary to Keppra-source for fever was not found He was admitted again on 6/11 with a fever of 103-he was thought to have aspirated. Chest x-ray revealed a right lower lobe "discoid atelectasis" without any other infiltrates. He was discharged home on Levaquin on 6/14.  Subjective: Patient is nonverbal  Assessment/Plan: Aspiration pneumonia? tachycardia/lactic acidosis-sepsis -the patient was previously requiring 1-2 L of oxygen at night with C Pap-after the suspected aspiration he was requiring 100% oxygen -now titrated back down to 2 L of O2 -Still no clear infiltrates noted on chest x-ray  -No fevers until 6/20 - 100.9  - no other source of sepsis found -Blood cultures negative -Stool for C. difficile negative -UA negative  -Continue to follow in step down unit with intermittent BiPAP use -Continue IV  Zosyn-tomorrow will be day 5-we could discontinue Zosyn completely at this point  Acquired pancytopenia -Counts began  dropping when patient was started on Keppra recently despite stopping, counts continue to drop - WBC Count dropped to 1.6  from normal-  given Neupogen yesterday-counts normalized today-Dr. Marin Olp has evaluated the patient and is considering giving Neulasta prior to discharge -For hemoglobin of 7.8 today, Dr. Marin Olp has ordered 2 U PRBC to be transfused today - platelets slightly low since admission - cont heparin for now  Cerebral palsy -Bedbound and has J tube- at baseline, is able to interact with family via facial gestures   Hypernatremia - sodium proved- cont D51/2 NS for now  Hypokalemia - Replace again today 40 mEq-generally takes 20 mEq a dayim  J tube distended - abdomen -According to the legal guardian, she feels that the tube is not working and she would like it to be evaluated-have requested an IR consult -X-ray suggestive of ileus however the patient does have good bowel sounds- -J tube replaced on 6/20-resumes tube feeds at 10 mL an hour and increasing to goal rate of 40 while watching residuals and well watching carefully for recurrence of aspiration- should be at his goal rate in the next 2-3 hours -If he continues to tolerate tube feeds at goal rate, IV fluids can be discontinued and free water can be added tomorrow morning prior to discharge home  Generalized convulsive epilepsy -Intolerant of a number of medications -Taking Clobazam  currently   OSA (obstructive sleep apnea) -Continue BiPAP at night- settings 7 and 12.    Cerebral palsy, quadriplegic Total care at home by legal guardian    Code Status: DO NOT RESUSCITATE Family Communication: With legal guardians "mother and father" Disposition Plan: Follow  in step down unit DVT prophylaxis: Heparin Consultants: Hematology on my interventional radiology Procedures: Change of [place] tube  Antibiotics: Anti-infectives    Start     Dose/Rate Route Frequency Ordered Stop   07/21/14 1400  metroNIDAZOLE (FLAGYL)  IVPB 500 mg  Status:  Discontinued     500 mg 100 mL/hr over 60 Minutes Intravenous Every 8 hours 07/21/14 1246 07/22/14 1224   07/21/14 0800  vancomycin (VANCOCIN) IVPB 750 mg/150 ml premix  Status:  Discontinued     750 mg 150 mL/hr over 60 Minutes Intravenous  Once 07/21/14 0617 07/21/14 0618   07/21/14 0800  vancomycin (VANCOCIN) IVPB 750 mg/150 ml premix  Status:  Discontinued     750 mg 150 mL/hr over 60 Minutes Intravenous Every 12 hours 07/21/14 0618 07/21/14 1024   07/21/14 0100  piperacillin-tazobactam (ZOSYN) IVPB 3.375 g     3.375 g 12.5 mL/hr over 240 Minutes Intravenous Every 8 hours 07/20/14 2004     07/20/14 2000  metroNIDAZOLE (FLAGYL) IVPB 500 mg  Status:  Discontinued     500 mg 100 mL/hr over 60 Minutes Intravenous Every 8 hours 07/20/14 1955 07/21/14 1246   07/20/14 1700  vancomycin (VANCOCIN) IVPB 1000 mg/200 mL premix     1,000 mg 200 mL/hr over 60 Minutes Intravenous  Once 07/20/14 1657 07/20/14 2052   07/20/14 1630  piperacillin-tazobactam (ZOSYN) IVPB 3.375 g     3.375 g 100 mL/hr over 30 Minutes Intravenous  Once 07/20/14 1616 07/20/14 1730      Objective: Filed Weights   07/21/14 0533 07/22/14 0500 07/23/14 0500  Weight: 60 kg (132 lb 4.4 oz) 58.6 kg (129 lb 3 oz) 58.9 kg (129 lb 13.6 oz)    Intake/Output Summary (Last 24 hours) at 07/23/14 1305 Last data filed at 07/23/14 0600  Gross per 24 hour  Intake   1230 ml  Output    525 ml  Net    705 ml     Vitals Filed Vitals:   07/23/14 0443 07/23/14 0500 07/23/14 0738 07/23/14 0849  BP: 112/57  98/81   Pulse: 100  100   Temp: 99.1 F (37.3 C)  99.2 F (37.3 C)   TempSrc: Axillary  Axillary   Resp: 25  14   Height:      Weight:  58.9 kg (129 lb 13.6 oz)    SpO2: 99%  100% 99%    Exam:  General:  Pt is alert, not in acute distress  HEENT: No icterus, No thrush, oral mucosa moist  Cardiovascular: regular rate and rhythm, S1/S2 No murmur  Respiratory: clear to auscultation bilaterally    Abdomen: Soft, +Bowel sounds, non tender, non distended, no guarding  MSK: No LE edema, cyanosis or clubbing  Data Reviewed: Basic Metabolic Panel:  Recent Labs Lab 07/21/14 0130 07/21/14 0515 07/21/14 0854 07/22/14 1645 07/23/14 0505  NA 148* 148* 146* 143 144  K 3.3* 3.3* 3.5 3.1* 3.0*  CL 106 107 106 102 105  CO2 34* 33* 33* 33* 32  GLUCOSE 85 88 90 86 83  BUN 13 13 12 9 9   CREATININE 1.05 1.06 1.04 1.06 1.11  CALCIUM 8.6* 8.4* 8.5* 8.3* 8.1*   Liver Function Tests:  Recent Labs Lab 07/20/14 1629  AST 35  ALT 160*  ALKPHOS 224*  BILITOT 1.5*  PROT 6.3*  ALBUMIN 3.2*   No results for input(s): LIPASE, AMYLASE in the last 168 hours. No results for input(s): AMMONIA in the last 168 hours. CBC:  Recent Labs Lab 07/20/14 1629 07/21/14 0130 07/22/14 1645 07/22/14 1700 07/23/14 0505  WBC 8.3 7.3 1.6* 1.7* 8.2  NEUTROABS 7.1 6.2  --   --  7.2  HGB 10.9* 8.3* 8.1* 8.0* 7.8*  HCT 36.2* 27.1* 26.4* 26.2* 25.6*  MCV 105.5* 103.8* 103.1* 102.7* 104.5*  PLT 135* 120* 111* 110* 114*   Cardiac Enzymes: No results for input(s): CKTOTAL, CKMB, CKMBINDEX, TROPONINI in the last 168 hours. BNP (last 3 results) No results for input(s): BNP in the last 8760 hours.  ProBNP (last 3 results) No results for input(s): PROBNP in the last 8760 hours.  CBG:  Recent Labs Lab 07/21/14 0021  GLUCAP 92    Recent Results (from the past 240 hour(s))  MRSA PCR Screening     Status: None   Collection Time: 07/13/14  4:05 PM  Result Value Ref Range Status   MRSA by PCR NEGATIVE NEGATIVE Final    Comment:        The GeneXpert MRSA Assay (FDA approved for NASAL specimens only), is one component of a comprehensive MRSA colonization surveillance program. It is not intended to diagnose MRSA infection nor to guide or monitor treatment for MRSA infections.   Clostridium Difficile by PCR (not at [facility])     Status: None   Collection Time: 07/15/14  7:06 AM  Result Value  Ref Range Status   C difficile by pcr NEGATIVE NEGATIVE Final  Respiratory virus panel     Status: None   Collection Time: 07/15/14 12:25 PM  Result Value Ref Range Status   Respiratory Syncytial Virus A Negative Negative Final   Respiratory Syncytial Virus B Negative Negative Final   Influenza A Negative Negative Final   Influenza B Negative Negative Final   Parainfluenza 1 Negative Negative Final   Parainfluenza 2 Negative Negative Final   Parainfluenza 3 Negative Negative Final   Metapneumovirus Negative Negative Final   Rhinovirus Negative Negative Final   Adenovirus Negative Negative Final    Comment: (NOTE) Performed At: [facility] [place], [place] [id number] Lindon Romp MD OJ:[id number]   Blood Culture (routine x 2)     Status: None (Preliminary result)   Collection Time: 07/20/14  4:25 PM  Result Value Ref Range Status   Specimen Description BLOOD LEFT ARM  Final   Special Requests   Final    BOTTLES DRAWN AEROBIC AND ANAEROBIC 5CC BLUE 3CC RED   Culture NO GROWTH 3 DAYS  Final   Report Status PENDING  Incomplete  Blood Culture (routine x 2)     Status: None (Preliminary result)   Collection Time: 07/20/14  4:32 PM  Result Value Ref Range Status   Specimen Description BLOOD LEFT HAND  Final   Special Requests BOTTLES DRAWN AEROBIC ONLY 5CC  Final   Culture NO GROWTH 3 DAYS  Final   Report Status PENDING  Incomplete  Urine culture     Status: None   Collection Time: 07/20/14  4:41 PM  Result Value Ref Range Status   Specimen Description URINE, RANDOM  Final   Special Requests NONE  Final   Culture NO GROWTH 2 DAYS  Final   Report Status 07/22/2014 FINAL  Final  Clostridium Difficile by PCR (not at [facility])     Status: None   Collection Time: 07/22/14  6:00 AM  Result Value Ref Range Status   C difficile by pcr NEGATIVE NEGATIVE Final     Studies: Ir Replc Duoden/jejuno Tube Percut W/fluoro  07/22/2014   CLINICAL DATA:  24 year old  male with a history of cerebral palsy has been referred for evaluation of gastrojejunostomy tube replacement.  EXAM: GASTROJEJUNOSTOMY TUBE FLUOROSCOPIC REPLACEMENT  FLUOROSCOPY TIME:  1 minutes 30 seconds  MEDICATIONS AND MEDICAL HISTORY: Versed 0 mg, Fentanyl 0 mcg.  ANESTHESIA/SEDATION: Moderate sedation time: 0 minutes  CONTRAST:  10 cc enteric contrast  PROCEDURE: The procedure, risks, benefits, and alternatives were explained to the patient's family. Questions regarding the procedure were encouraged and answered. The patient understands and consents to the procedure.  The epigastrium was prepped with Betadine in a sterile fashion, and a sterile drape was applied covering the operative field. A sterile gown and sterile gloves were used for the procedure.  Contrast was infused through the jejunostomy port of the patient's indwelling GJ tube confirming position within small bowel.  A Glidewire was navigated through the jejunostomy port into the proximal small bowel. The balloon retention was reduced by an aspirating saline, and then the gastrojejunostomy would tube was removed over the wire.  A Kumpe the catheter was then advanced over the Glidewire into the small bowel. The Glidewire was exchanged for a Bentson wire.  With a Bentson wire in place, the Kumpe was removed and a new gastrojejunostomy tube was advanced over the Bentson wire into position.  7 cc of saline was then used to inflate the retention balloon.  Small amount of contrast confirmed position of the new GJ tube in the proximal small bowel.  The patient tolerated the procedure well and remained hemodynamically stable throughout.  No complications were encountered and no significant blood loss was encountered.  Marland Kitchen  FINDINGS: New gastrojejunostomy tube with the tip in the proximal small bowel.  IMPRESSION: Status post replacement of gastrojejunostomy tube, with the new tube terminating in proximal small bowel.  Signed,  Dulcy Fanny. Earleen Newport, DO  [facility]  [facility]   Electronically Signed   By: Corrie Mckusick D.O.   On: 07/22/2014 16:45   Dg Chest Port 1 View  07/22/2014   CLINICAL DATA:  Hypoxia.  EXAM: PORTABLE CHEST - 1 VIEW  COMPARISON:  07/20/2014  FINDINGS: Right-sided Port-A-Cath remains in place with tip overlying the lower SVC. Cardiac silhouette is within normal limits for size. There is improved aeration of the right lower lung with minimal residual opacity in the right midlung, likely subsegmental atelectasis. Mild left basilar opacity persists, also most suggestive of atelectasis. No sizable pleural effusion or pneumothorax is identified. Posterior spinal fusion rods are again noted.  IMPRESSION: Improved aeration of the right lung base. Mild left basilar atelectasis.   Electronically Signed   By: Logan Bores   On: 07/22/2014 07:50    Scheduled Meds:  Scheduled Meds: . sodium chloride   Intravenous Once  . albuterol  2.5 mg Nebulization Q6H  . antiseptic oral rinse  7 mL Mouth Rinse q12n4p  . calcium carbonate (dosed in mg elemental calcium)  1,250 mg Per Tube TID WC  . chlorhexidine  15 mL Mouth Rinse BID  . cloBAZam  2.5 mg Per Tube TID  . furosemide  20 mg Intravenous Once  . gabapentin  900 mg Per Tube TID  . heparin  5,000 Units Subcutaneous 3 times per day  . metoCLOPramide  10 mg Per Tube QID  . neomycin-bacitracin-polymyxin   Topical TID  . pantoprazole (PROTONIX) IV  40 mg Intravenous Q12H  . piperacillin-tazobactam (ZOSYN)  IV  3.375 g Intravenous Q8H  . pseudoephedrine  30 mg Per Tube TID  . simethicone  80 mg Oral QID  . sucralfate  1 g Oral TID WC & HS   Continuous Infusions: . dextrose 5 % and 0.45% NaCl 75 mL/hr at 07/22/14 2335    Time spent on care of this patient: 35 min   [place], MD 07/23/2014, 1:05 PM  LOS: 3 days   [facility] Office  [phone] Pager - Text Page per [email or web address] If 7PM-7AM, please contact night-coverage  [email or web address]

## 2014-07-23 NOTE — Progress Notes (Signed)
   07/23/14 1500  Clinical Encounter Type  Visited With Patient and family together  Visit Type Initial;Social support;Spiritual support  Referral From Nurse  Consult/Referral To Chaplain  Spiritual Encounters  Spiritual Needs Emotional  CH met with mother, pt in room; Discussed current status and plan of care with mother; support as needed.

## 2014-07-23 NOTE — Progress Notes (Signed)
CM requested to come to pt's room per mom. Mom asked CM to contact MD and request a palliative consult be done prior to pt's discharge. CM shared information with pt's nurse and nurse is to reach out to MD for palliative consult order, CM to f/u d/c needs.

## 2014-07-23 NOTE — Progress Notes (Signed)
CM requested by pt's mom to come to pt's room. Mom shared with CM  She was recently informed pt will be d/c on tomorrow by MD. Pierre Bali  is requesting MD to  personally phone pt's PCP(Dr.Talbots)  @ [phone] prior to discharged. Mom states pt will not physically leave hospital until conversation with Dr.[name] is  established regarding pt's current hospital visit. Pt will need face to face,  and resumption order for [facility]  @ discharge. No other needs identified @ present time. CM to f/u with discharge disposition. Whitman Hero RN,BSN,CM [phone]

## 2014-07-23 NOTE — Progress Notes (Signed)
Unfortunately, Jeffery [name] is back in the hospital. This is becoming more of a regular occurrence for him. It sounds like he had some more aspiration. He also developed an ileus. I'm not sure which came first.  This morning, he looks pretty good. His heart rate is less than 100. His oxygen saturation is 100%. All cultures have been negative to date.  His tube feeds are running at 20 mL an hour.  He came in on the 18th. At that time, his white cell count was 8.3. On the 20th, and white cell count went down to 1.6. He received a Neupogen injection yesterday.  I think that we should give Neulasta to try once he is out of the hospital. I'm not sure if he had a reaction to that or not.  His hemoglobin is trending downward. I think he is by going to need to be transfused.  I still can't figure out why he has these aspiration episodes. He has not had one for several months.  I think that whenever he does aspirate, it does tend to cause marrow suppression. I think that's why his white cell count tends to be on the lower side.  Again, the bone marrow biopsy that he had done really did not show any hematologic malignancy. There is no obvious indicator of a bone marrow disease. His cytogenetics were normal.  On his physical exam, he is afebrile. His pulse is 100. His blood pressure is 112/57. His lungs show some scattered rhonchi bilaterally. He has occasional wheezes. He has had air movement bilaterally. Cardiac exam is slightly tachycardic but regular. No murmurs are noted. Abdomen is soft. He does have good bowel sounds. There is no obvious distention. Extremities show some mild trace edema.  I do appreciate all the great care that he is getting in the stepdown unit. His mom is thankful for the compassion that is being shown by all of the staff.  Jeffery E.  1 Chronicles 16:11

## 2014-07-23 NOTE — Progress Notes (Signed)
Pt.'s mom wishes to continue with tube feeds tonight. BIPAP is in the pt.'s room on standby.

## 2014-07-23 NOTE — Progress Notes (Signed)
Pt. Placed on 3L [place] with sat 100%, but still continues to grunt and seems to be having abdominal pain.  Mid-level  paged about situation and awaiting call back.  Mother at bedside. Will continue to monitor

## 2014-07-23 NOTE — Progress Notes (Signed)
Pt. Heard grunting in room with increased RR and HR.  Pt. Placed on 10L NRB and respiratory consulted to give breathing treatment and then resume NRB.  Will continue to monitor

## 2014-07-23 NOTE — Progress Notes (Signed)
Pt. Is unable to go on BIPAP at this time due to getting feedings & meds through PEG tube. Pt.'s mom states that the pt. Has to wait at least 1.5-2 hours after getting feedings & 30 mins after meds to be placed on BIPAP. Pt.'s mom & RN aware to call RT when the pt. Is ready to be placed on BIPAP.

## 2014-07-24 DIAGNOSIS — E876 Hypokalemia: Secondary | ICD-10-CM

## 2014-07-24 DIAGNOSIS — D619 Aplastic anemia, unspecified: Secondary | ICD-10-CM

## 2014-07-24 DIAGNOSIS — Z515 Encounter for palliative care: Secondary | ICD-10-CM

## 2014-07-24 DIAGNOSIS — J690 Pneumonitis due to inhalation of food and vomit: Secondary | ICD-10-CM

## 2014-07-24 DIAGNOSIS — G808 Other cerebral palsy: Secondary | ICD-10-CM

## 2014-07-24 DIAGNOSIS — R140 Abdominal distension (gaseous): Secondary | ICD-10-CM

## 2014-07-24 DIAGNOSIS — D638 Anemia in other chronic diseases classified elsewhere: Secondary | ICD-10-CM

## 2014-07-24 LAB — TYPE AND SCREEN
ABO/RH(D): O POS
ANTIBODY SCREEN: NEGATIVE
Unit division: 0
Unit division: 0

## 2014-07-24 LAB — GLUCOSE, CAPILLARY
Glucose-Capillary: 82 mg/dL (ref 65–99)
Glucose-Capillary: 98 mg/dL (ref 65–99)

## 2014-07-24 LAB — CBC
HCT: 33 % — ABNORMAL LOW (ref 39.0–52.0)
Hemoglobin: 10.5 g/dL — ABNORMAL LOW (ref 13.0–17.0)
MCH: 30.9 pg (ref 26.0–34.0)
MCHC: 31.8 g/dL (ref 30.0–36.0)
MCV: 97.1 fL (ref 78.0–100.0)
Platelets: 115 10*3/uL — ABNORMAL LOW (ref 150–400)
RBC: 3.4 MIL/uL — AB (ref 4.22–5.81)
RDW: 20.1 % — ABNORMAL HIGH (ref 11.5–15.5)
WBC: 6.3 10*3/uL (ref 4.0–10.5)

## 2014-07-24 LAB — BASIC METABOLIC PANEL
Anion gap: 10 (ref 5–15)
BUN: 8 mg/dL (ref 6–20)
CALCIUM: 8.1 mg/dL — AB (ref 8.9–10.3)
CO2: 29 mmol/L (ref 22–32)
CREATININE: 1 mg/dL (ref 0.61–1.24)
Chloride: 104 mmol/L (ref 101–111)
GFR calc non Af Amer: >60 mL/min (ref >60)
Glucose, Bld: 83 mg/dL (ref 65–99)
Potassium: 2.9 mmol/L — ABNORMAL LOW (ref 3.5–5.1)
SODIUM: 143 mmol/L (ref 135–145)

## 2014-07-24 LAB — POTASSIUM: Potassium: 4.2 mmol/L (ref 3.5–5.1)

## 2014-07-24 MED ORDER — FUROSEMIDE 10 MG/ML IJ SOLN
20.0000 mg | Freq: Once | INTRAMUSCULAR | Status: AC
  Administered 2014-07-24: 20 mg via INTRAVENOUS

## 2014-07-24 MED ORDER — POTASSIUM CHLORIDE 20 MEQ/15ML (10%) PO SOLN
40.0000 meq | Freq: Every day | ORAL | Status: AC
  Administered 2014-07-24: 40 meq via ORAL
  Filled 2014-07-24: qty 30

## 2014-07-24 MED ORDER — FLUCONAZOLE 40 MG/ML PO SUSR
100.0000 mg | Freq: Every day | ORAL | Status: DC
  Administered 2014-07-24 – 2014-07-26 (×3): 100 mg via ORAL
  Filled 2014-07-24 (×3): qty 2.5

## 2014-07-24 MED ORDER — CEFUROXIME AXETIL 250 MG/5ML PO SUSR
250.0000 mg | Freq: Two times a day (BID) | ORAL | Status: DC
  Administered 2014-07-24 – 2014-07-26 (×5): 250 mg
  Filled 2014-07-24 (×6): qty 5

## 2014-07-24 MED ORDER — POTASSIUM CHLORIDE 10 MEQ/50ML IV SOLN
10.0000 meq | INTRAVENOUS | Status: AC
  Administered 2014-07-24 (×4): 10 meq via INTRAVENOUS
  Filled 2014-07-24 (×4): qty 50

## 2014-07-24 MED ORDER — FUROSEMIDE 10 MG/ML IJ SOLN
20.0000 mg | Freq: Once | INTRAMUSCULAR | Status: DC
Start: 2014-07-24 — End: 2014-07-25

## 2014-07-24 MED ORDER — FUROSEMIDE 10 MG/ML IJ SOLN
INTRAMUSCULAR | Status: AC
  Filled 2014-07-24: qty 2

## 2014-07-24 MED ORDER — DEXTROSE-NACL 5-0.45 % IV SOLN
INTRAVENOUS | Status: DC
  Administered 2014-07-24 – 2014-07-25 (×2): via INTRAVENOUS

## 2014-07-24 NOTE — Consult Note (Signed)
Consultation Note Date: 07/24/2014   Patient Name: Jeffery [name]  DOB: 11/20/1990  MRN: [id number]  Age / Sex: 24 y.o., male   PCP: Marijean Bravo, MD Referring Physician: Kelvin Cellar, MD  Reason for Consultation: Establishing goals of care  Palliative Care Assessment and Plan Summary of Established Goals of Care and Medical Treatment Preferences   Clinical Assessment/Narrative: 24 yo with multiple medical problems as below admitted for aspiration PNA.  Recurrent hospitalizations related to PNA, neutropenia, fevers.   Contacts/Participants in Discussion: Primary Decision Maker: Jeffery [name], Legal guardian HCPOA: yes    Long discussion this morning with Jeffery [name].  She talked about the emotions she has had related to his care over the 23 years she has cared for him.  She took over initially as a foster parent when he was still an infant.  She feels like he has overcome a lot he was not expected to. She talked about how difficult the past 6 months especially have been with him being in the hospital so much. She feels fortunate that he has been able to get back home and enjoy interacting with them despite not being verbal.  He has also been working with [employer] here in [place]. He is even able to play some specially modified video games which he enjoys. Jeffery [name] has been thinking a lot lately about decline coming. She worries about recurrent nature of infections, abx resistance, not getting better after one of these episodes.  A lot of this comes from her background as a Marine scientist. She made him DNR during last admission and that was emotionally tough. She has started to think about hospice down the road.  She asked about outpatient palliative care services which are available, but I am not sure what they would really offer to his care as he does not have particular symptom management needs.  She is preparing for the possibility of routine transfusions or colony stimulating factor given periodically at Dr  Dicie Beam office.  We talked today about that decision surrounding hospice and how I would think the decision to go that route is when he is not able to enjoy those things that give him QOL as above.  She hopes to have him home tomorrow and continue usual care as has been in the past.  I encouraged her to reach out to PMT if she needed assistance in future, had questions, needed further support in decision making.    Code Status/Advance Care Planning:  DNR  Symptom Management:   Symptoms controlled  Psycho-social/Spiritual:   Support System: Priscille Heidelberg and her husband, church community at home  Desire for further Chaplaincy support:no  Prognosis: Unable to determine  Discharge Planning:  Home with Home Health       Chief Complaint: Hypoxia  History of Present Illness:  24 yo male with PMHx of cerebral palsy w/quadraplegia, OSA wth nocturnal CPAP, recurrent aspiration in setting of g-tube, pancytopenia, seizures who was admitted on 6/18 with chief complaint of hypoxia and concern for aspiration event. He has had multiple hospitalizations recently related to neutropenia/fevers/aspiration.   After last hospitalization 1 week prior, he was noted to be more lethargic, changes in breathing pattern and hypoxia noted.  When CPAP removed coating on mouth noted was orange and suspicious fr aspiration of at least his medications.  He was admitted with Asp PNA and possible ileus.  He has been followed by his onclogist [name] for his cytopenias and Dr E has seen him while inpatient here. He did  have some drop in his blood counts while here as well.  Overall seems to have clinically improved and his legal guardian expects him to be discharged back home by tomorrow.   Primary Diagnoses  Present on Admission:  . Aspiration pneumonia . Sepsis . Cerebral palsy, quadriplegic . Anemia of chronic disease . Generalized convulsive epilepsy . Neuromuscular scoliosis . OSA (obstructive sleep  apnea) . Erythema, possible cellulitis . Enteritis   I have reviewed the medical record, interviewed the patient and family, and examined the patient. The following aspects are pertinent.  Past Medical History  Diagnosis Date  . CP (cerebral palsy), spastic, quadriplegic   . Osteoporosis   . Undescended testes   . Seasonal allergies   . IVH (intraventricular hemorrhage) 1990/02/24    Grade IV  . Hip dislocation, bilateral   . Dysphagia   . Retinopathy of prematurity   . 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 Neuromuscular scoliosis   . Osteoporosis   . Complex partial seizures   . Generalized convulsive epilepsy without mention of intractable epilepsy   . Sinus bradycardia     HR drops to 38-40 while sleeping  . Blister of right heel     fluid filled; origin unknown  . Kidney stones     ?  Marland Kitchen Pneumonia      chronic pneumonia ,respitory failure dx Augest 2014  . Aspiration pneumonia     "chronic" (04/12/2014)  . OSA treated with BiPAP     "since age 29"   . Anemia   . 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 GERD (gastroesophageal reflux disease)   . Epilepsy   . Spastic quadriplegia   . Neutropenia 07/03/2014   History   Social History  . Marital Status: Single    Spouse Name: N/A  . Number of Children: 0  . Years of Education: N/A   Occupational History  . Student    .     Social History Main Topics  . Smoking status: Never Smoker   . Smokeless tobacco: Never Used     Comment: never used tobacco  . Alcohol Use: No  . Drug Use: No  . Sexual Activity: No   Other Topics Concern  . None   Social History Narrative   Pt lives at home with his legal guardians Jenne Campus)         Family History  Problem Relation Age of Onset  . Adopted: Yes   Scheduled Meds: . sodium chloride   Intravenous Once  . albuterol  2.5 mg Nebulization Q6H  . antiseptic oral rinse  7 mL Mouth Rinse q12n4p  .  calcium carbonate (dosed in mg elemental calcium)  1,250 mg Per Tube TID WC  . chlorhexidine  15 mL Mouth Rinse BID  . cloBAZam  2.5 mg Per Tube TID  . furosemide  20 mg Intravenous Once  . gabapentin  900 mg Per Tube TID  . heparin  5,000 Units Subcutaneous 3 times per day  . metoCLOPramide  10 mg Per Tube QID  . neomycin-bacitracin-polymyxin   Topical TID  . pantoprazole (PROTONIX) IV  40 mg Intravenous Q12H  . piperacillin-tazobactam (ZOSYN)  IV  3.375 g Intravenous Q8H  . potassium chloride  10 mEq Intravenous Q1 Hr x 4  . pseudoephedrine  30 mg Per Tube TID  . simethicone  80 mg Oral QID  . sucralfate  1 g Oral  TID WC & HS   Continuous Infusions:  PRN Meds:.acetaminophen (TYLENOL) oral liquid 160 mg/5 mL, diazepam, dicyclomine, diphenhydrAMINE, ketorolac, midazolam, neomycin-bacitracin-polymyxin Medications Prior to Admission:  Prior to Admission medications   Medication Sig Start Date End Date Taking? Authorizing Provider  acetaminophen (TYLENOL) 160 MG/5ML solution 480 mg by Gastric Tube route every 6 (six) hours as needed for fever.   Yes Historical Provider, MD  acetaminophen (TYLENOL) 80 MG suppository Place 480 mg rectally every 4 (four) hours as needed for fever.   Yes Historical Provider, MD  albuterol (PROVENTIL HFA;VENTOLIN HFA) 108 (90 BASE) MCG/ACT inhaler Inhale 2 puffs into the lungs every 4 (four) hours as needed for shortness of breath.   Yes Historical Provider, MD  albuterol (PROVENTIL) (2.5 MG/3ML) 0.083% nebulizer solution Take 2.5 mg by nebulization See admin instructions. Give 1 vial (2.5 mg) twice daily (8am and 8pm) and every 4 hours as needed for shortness of breath or wheezing 10/11/12  Yes Elsie Stain, MD  Amino Acids-Protein Hydrolys (FEEDING SUPPLEMENT, PRO-STAT SUGAR FREE 64,) LIQD Take 30 mLs by mouth 2 (two) times daily. Patient taking differently: 30 mLs by Gastric Tube route 2 (two) times daily. 12p and 5p 06/27/14  Yes Velvet Bathe, MD  baclofen  (GABLOFEN) 40000 MCG/20ML SOLN by Intrathecal route continuous. 385.2 mcg in 24 hours 05/04/12  Yes Jodi Geralds, MD  bag balm OINT ointment Apply 1 application topically See admin instructions. Apply to sacral area with each diaper change   Yes Historical Provider, MD  calcium carbonate, dosed in mg elemental calcium, 1250 MG/5ML 1,250 mg by Gastric Tube route 3 (three) times daily. 8am , 2pm, and 8pm   Yes Historical Provider, MD  cloBAZam (ONFI) 2.5 MG/ML solution Place 2.5 mL in gastrostomy tube 3 times daily Patient taking differently: 2.5 mg by Gastric Tube route 3 (three) times daily. 8am, 2pm, 8pm 05/17/14  Yes Jodi Geralds, MD  DIASTAT ACUDIAL 20 MG GEL Place 12.5 mg rectally as needed (for seizure lasting 2 minutes or longer or repetitive seizures - no more than 1 dose in 12 hours (not given with nasal versed)).  04/01/14  Yes Historical Provider, MD  dicyclomine (BENTYL) 10 MG/5ML syrup 10 mg by Gastric Tube route every 8 (eight) hours as needed (abdominal cramps). Do not exceed 5 days in one week   Yes Historical Provider, MD  diphenoxylate-atropine (LOMOTIL) 2.5-0.025 MG/5ML liquid 10 mLs by Gastric Tube route every 6 (six) hours as needed for diarrhea or loose stools.    Yes Historical Provider, MD  folic acid (FOLVITE) 1 MG tablet 1 mg by Gastric Tube route daily. 8:00am per G tube   Yes Historical Provider, MD  furosemide (LASIX) 10 MG/ML solution 20 mg by Gastric Tube route daily as needed for fluid or edema. Maximum 3 times week   Yes Historical Provider, MD  gabapentin (NEURONTIN) 250 MG/5ML solution Take 18 mL 3 times daily Patient taking differently: Place 900 mg into feeding tube 3 (three) times daily. Give 18 mls (900 mg) at 8am, 2pm, 8pm (for neuropathy) 05/01/14  Yes Jodi Geralds, MD  ibuprofen (ADVIL,MOTRIN) 100 MG/5ML suspension 400 mg by Gastric Tube route every 6 (six) hours as needed for fever (pain).    Yes Historical Provider, MD  ketoconazole (NIZORAL) 2 %  cream Apply 1 application topically 2 (two) times daily as needed for irritation (yeast rash from antibiotics).    Yes Historical Provider, MD  lansoprazole (PREVACID SOLUTAB) 30 MG disintegrating tablet 30  mg by Gastric Tube route 2 (two) times daily. 7:30am and 7:30pm 09/07/12  Yes Lafayette Dragon, MD  levofloxacin (LEVAQUIN) 500 MG tablet 500 mg by Gastric Tube route every evening. 5 day course started 07/17/14 5pm   Yes Historical Provider, MD  metoCLOPramide (REGLAN) 5 MG/5ML solution Place 10 mLs (10 mg total) into feeding tube 4 (four) times daily -  before meals and at bedtime. Patient taking differently: 10 mg by Gastric Tube route 4 (four) times daily -  before meals and at bedtime.  07/12/14  Yes Volanda Napoleon, MD  midazolam (VERSED) 5 MG/ML injection Place 2 mLs (10 mg total) into the nose once. Draw up 54ml in 2 syringes. Remove blue vial access device. Attach syringe to nasal atomizer for intranasal administration. Give 61ml in right nostril x 2 for seizures lasting 2 minutes or longer or for repetitive seizures in a short period of time. Patient taking differently: Place 10 mg into the nose as needed (for seizure lasting 2 minutes or longer or repetitive seizures - no more than 1 dose in 12 hours (if unable to give diastat)). Draw up 23ml in 2 syringes. Remove blue vial access device. Attach syringe to nasal atomizer for intranasal administration. Give 4ml in right nostril x 2 for seizures lasting 2 minutes or longer or for repetitive seizures in a short period of time. 11/05/13  Yes Rockwell Germany, NP  Multiple Vitamin (MULTIVITAMIN) LIQD 10 mLs by Gastric Tube route daily.   Yes Historical Provider, MD  Multiple Vitamins-Minerals (ZINC PO) 5 mLs by Gastric Tube route daily. 244 mg / 5 mL zinc solution compounded by [facility]   Yes Historical Provider, MD  mupirocin ointment (BACTROBAN) 2 % Apply 1 application topically as needed (to G-T site). Patient taking differently: Apply 1  application topically 3 (three) times daily as needed (for redness at the G-T site).  09/07/12  Yes Lafayette Dragon, MD  Nutritional Supplements (TWOCAL HN) LIQD 237 mLs by Gastric Tube route See admin instructions. T.3 can at 46 cc hour x 12 hours - water 172 cc pre and post each and extra 172 bid.   Yes Historical Provider, MD  OnabotulinumtoxinA (BOTOX IJ) Inject 300 Units as directed See admin instructions. Every 3 months   Yes Historical Provider, MD  potassium chloride (KLOR-CON) 20 MEQ packet 20 mEq by Gastric Tube route See admin instructions. Give 20 meq daily except twice daily with lasix   Yes Historical Provider, MD  Pseudoephedrine HCl (SUDAFED CHILDRENS) 15 MG/5ML LIQD 30 mg by Gastric Tube route 3 (three) times daily. 6am, 2pm, 8pm   Yes Historical Provider, MD  simethicone (MYLICON) 301 MG chewable tablet 125 mg by Gastric Tube route See admin instructions. 125mg  caplet added to each can of Two Cal 3 times daily   Yes Historical Provider, MD  sodium phosphate (FLEET) enema Place 1 enema rectally daily as needed (gas buildup/ bloating).    Yes Historical Provider, MD  sucralfate (CARAFATE) 1 GM/10ML suspension 1 g by Gastric Tube route 4 (four) times daily. 7am, 12pm, 5pm, 7pm Administer alone 30 minutes prior to other medications.   Yes Historical Provider, MD  Vitamin Mixture (VITAMIN C) LIQD 300 mLs by Gastric Tube route daily.   Yes Historical Provider, MD  Water For Irrigation, Sterile (FREE WATER) SOLN 172 mLs by Gastric Tube route 4 (four) times daily.    Yes Historical Provider, MD  Zinc Oxide (BALMEX EX) Apply 1 application topically See admin instructions.  Apply to sacral with each diaper change   Yes Historical Provider, MD  OXYGEN-HELIUM IN Inhale into the lungs. Oxygen PRN to keep O2 Sat at 90%    Historical Provider, MD   Allergies  Allergen Reactions  . Depakote [Divalproex Sodium]     Causes pancreatitis   . Vimpat [Lacosamide] Rash  . Keppra [Levetiracetam] Other (See  Comments)    Bone marrow suppression  . Adhesive [Tape] Other (See Comments)    Rips skin off (paper tape is ok)  . Amoxicillin-Pot Clavulanate Rash    If given with fluconazole rash is not as severe or does not appear at all  . Neulasta [Pegfilgrastim] Other (See Comments)    Fever, tachycardia   CBC:    Component Value Date/Time   WBC 6.3 07/24/2014 0600   WBC 3.4* 07/03/2014 1300   HGB 10.5* 07/24/2014 0600   HGB 11.6* 07/03/2014 1300   HCT 33.0* 07/24/2014 0600   HCT 36.3* 07/03/2014 1300   PLT 115* 07/24/2014 0600   PLT 75* 07/03/2014 1300   MCV 97.1 07/24/2014 0600   MCV 108* 07/03/2014 1300   NEUTROABS 7.2 07/23/2014 0505   NEUTROABS 2.4 07/03/2014 1300   LYMPHSABS 0.2* 07/23/2014 0505   LYMPHSABS 0.4* 07/03/2014 1300   MONOABS 0.7 07/23/2014 0505   EOSABS 0.1 07/23/2014 0505   EOSABS 0.1 07/03/2014 1300   BASOSABS 0.0 07/23/2014 0505   BASOSABS 0.0 07/03/2014 1300   Comprehensive Metabolic Panel:    Component Value Date/Time   NA 143 07/24/2014 0600   NA 140 07/03/2014 1301   K 2.9* 07/24/2014 0600   K 3.4 07/03/2014 1301   CL 104 07/24/2014 0600   CL 96* 07/03/2014 1301   CO2 29 07/24/2014 0600   CO2 32 07/03/2014 1301   BUN 8 07/24/2014 0600   BUN 17 07/03/2014 1301   CREATININE 1.00 07/24/2014 0600   CREATININE 0.7 07/03/2014 1301   GLUCOSE 83 07/24/2014 0600   GLUCOSE 107 07/03/2014 1301   CALCIUM 8.1* 07/24/2014 0600   CALCIUM 9.5 07/03/2014 1301   AST 35 07/20/2014 1629   AST 24 07/03/2014 1301   ALT 160* 07/20/2014 1629   ALT 36 07/03/2014 1301   ALKPHOS 224* 07/20/2014 1629   ALKPHOS 166* 07/03/2014 1301   BILITOT 1.5* 07/20/2014 1629   BILITOT 1.00 07/03/2014 1301   PROT 6.3* 07/20/2014 1629   PROT 6.5 07/03/2014 1301   ALBUMIN 3.2* 07/20/2014 1629    Physical Exam: Vital Signs: BP 135/77 mmHg  Pulse 95  Temp(Src) 100.1 F (37.8 C) (Axillary)  Resp 22  Ht 4\' 2"  (1.27 m)  Wt 61 kg (134 lb 7.7 oz)  BMI 37.82 kg/m2  SpO2  98% SpO2: SpO2: 98 % O2 Device: O2 Device: Nasal Cannula O2 Flow Rate: O2 Flow Rate (L/min): 1.5 L/min Intake/output summary:  Intake/Output Summary (Last 24 hours) at 07/24/14 1053 Last data filed at 07/24/14 0800  Gross per 24 hour  Intake   2067 ml  Output    300 ml  Net   1767 ml   LBM: Last BM Date: 07/23/14 Baseline Weight: Weight: 56.7 kg (125 lb) Most recent weight: Weight: 61 kg (134 lb 7.7 oz)  Exam Findings:  GEN: alert, NAD, nonverbal HEENT: sclera anicteric, mmm CV: rrr LUNGS: CTAB ABD: soft, +J tube EXT: warm         Palliative Performance Scale: 30              Additional Data Reviewed: Recent Labs  07/23/14  0505  07/24/14  0600  WBC  8.2  6.3  HGB  7.8*  10.5*  PLT  114*  115*  NA  144  143  BUN  9  8  CREATININE  1.11  1.00     Time Total: 50 minutes Greater than 50%  of this time was spent counseling and coordinating care related to the above assessment and plan.  Signed by: Isaac Laud, DO  Doran Clay, DO  07/24/2014, 10:53 AM  Please contact Palliative Medicine Team phone at [phone] for questions and concerns.

## 2014-07-24 NOTE — Procedures (Addendum)
RT arrived to find that the pt had been placed on BIPAP (V60) by pt's mother.

## 2014-07-24 NOTE — Progress Notes (Signed)
Jeffery [name] is doing okay. He got 2 units of blood yesterday. He did well with this. His hemoglobin is 10.3.  His white cell count is 6.3. He does not need any Neupogen.  His potassium is 2.9. He probably needs some IV potassium.  So far, cultures are all negative. C. difficile is negative.  His tube feeds are doing all right. There is no obvious aspiration.  He's had no seizures.  On his physical exam, his temperature is 97.9. Pulse is 95. Blood pressure 135/77. Weight is 135 pounds. Oxygen saturation is 98%. Head and neck exam shows no obvious ocular or oral lesions. His lungs show better breath sounds bilaterally. He has some occasional rhonchi. Cardiac exam regular rate and rhythm with no murmurs, rubs or bruits. Abdomen is soft. Bowel sounds present. Feeding tube is intact. Extremities shows some trace edema in his lower legs.  I would go ahead and replace his potassium. I ordered for runs of IV potassium.  He does not need any Colony stimulating factor right now.  We will continue to follow along.  I appreciate the great care that he is getting in the stepdown unit from all the staff. His mom is also very thankful.  [name]  Proverbs 21:21

## 2014-07-24 NOTE — Progress Notes (Signed)
[facility] Progress Note   Jeffery [name] OBS:[id number] DOB: 1990-10-08 DOA: 07/20/2014 PCP: Marijean Bravo, MD  Brief narrative: Jeffery [name] is a 24 y.o. 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 The patient's guardian states that 2 nights prior to admission his abdomen became severely distended. At this point she decided to stop his tube feeds but had already given him his medications. She put his C Pap on and had to turned the oxygen level up to 100% to keep his saturation greater than 90%. In the morning when she took his C Pap off, his mouth was covered and orange solution. She states the only thing orange that she gave to him the night before was potassium and she feels that having the potassium in his mouth and requiring increased oxygen was a sign that he aspirated overnight. He was admitted on 5/18 for neutropenic fever-neutropenia/pancytopenia was thought to be secondary to Keppra-source for fever was not found He was admitted again on 6/11 with a fever of 103-he was thought to have aspirated. Chest x-ray revealed a right lower lobe "discoid atelectasis" without any other infiltrates. He was discharged home on Levaquin on 6/14.  Subjective: Patient is nonverbal  Assessment/Plan: Aspiration pneumonia? tachycardia/lactic acidosis-sepsis -the patient was previously requiring 1-2 L of oxygen at night with C Pap-after the suspected aspiration he was requiring 100% oxygen -now titrated back down to 2 L of O2 -Most recent chest x-ray was performed 07/22/2014 that showed improved aeration right lung base with left basilar atelectasis. -IV Zosyn discontinued today, will transition to oral Ceftin with pharmacy consultation for dosing.  Acquired pancytopenia -Counts began dropping when patient was started on Keppra recently despite stopping, counts continue to drop -Patient was evaluated by  hematology during this hospitalization. He received colony-stimulating factor along with blood transfusion with 2 units of packed red blood cells. Labs today showing a white count of 6.3 with platelet count of 115,000.  Probable drug reaction. -I was notified by RN patient developing macular papular rash involving trunk along with low-grade temperature while Zosyn was infusing. Symptoms improved shortly after the discontinuation of IV Zosyn. Was transitioned to Ceftin per PEG tube.  Cerebral palsy -Bedbound and has J tube- at baseline, is able to interact with family via facial gestures   Hypernatremia - sodium proved- cont D51/2 NS for now  Hypokalemia -Labs showing potassium of 2.9 for which she was given 40 mEq of IV potassium along with potassium per PEG tube.  Nutrition -According to the legal guardian, she feels that the tube is not working and she would like it to be evaluated-have requested an IR consult -X-ray suggestive of ileus however the patient does have good bowel sounds- -J tube replaced on 6/20-resumes tube feeds at 10 mL an hour and increasing to goal rate of 40 while watching residuals and well watching carefully for recurrence of aspiration- should be at his goal rate in the next 2-3 hours  Generalized convulsive epilepsy -Intolerant of a number of medications -Taking Clobazam  currently   OSA (obstructive sleep apnea) -Continue BiPAP at night- settings 7 and 12.    Cerebral palsy, quadriplegic Total care at home by legal guardian    Code Status: DO NOT RESUSCITATE Family Communication: With legal guardians "mother and father" Disposition Plan: Follow in step down unit DVT prophylaxis: Heparin Consultants: Hematology on my interventional radiology Procedures: Change of GJ tube  Antibiotics: Anti-infectives    Start  Dose/Rate Route Frequency Ordered Stop   07/24/14 1345  fluconazole (DIFLUCAN) 40 MG/ML suspension 100 mg     100 mg Oral Daily 07/24/14  1326     07/24/14 1345  cefUROXime (CEFTIN) 250 MG/5ML suspension 250 mg     250 mg Per Tube Every 12 hours 07/24/14 1326     07/21/14 1400  metroNIDAZOLE (FLAGYL) IVPB 500 mg  Status:  Discontinued     500 mg 100 mL/hr over 60 Minutes Intravenous Every 8 hours 07/21/14 1246 07/22/14 1224   07/21/14 0800  vancomycin (VANCOCIN) IVPB 750 mg/150 ml premix  Status:  Discontinued     750 mg 150 mL/hr over 60 Minutes Intravenous  Once 07/21/14 0617 07/21/14 0618   07/21/14 0800  vancomycin (VANCOCIN) IVPB 750 mg/150 ml premix  Status:  Discontinued     750 mg 150 mL/hr over 60 Minutes Intravenous Every 12 hours 07/21/14 0618 07/21/14 1024   07/21/14 0100  piperacillin-tazobactam (ZOSYN) IVPB 3.375 g  Status:  Discontinued     3.375 g 12.5 mL/hr over 240 Minutes Intravenous Every 8 hours 07/20/14 2004 07/24/14 1325   07/20/14 2000  metroNIDAZOLE (FLAGYL) IVPB 500 mg  Status:  Discontinued     500 mg 100 mL/hr over 60 Minutes Intravenous Every 8 hours 07/20/14 1955 07/21/14 1246   07/20/14 1700  vancomycin (VANCOCIN) IVPB 1000 mg/200 mL premix     1,000 mg 200 mL/hr over 60 Minutes Intravenous  Once 07/20/14 1657 07/20/14 2052   07/20/14 1630  piperacillin-tazobactam (ZOSYN) IVPB 3.375 g     3.375 g 100 mL/hr over 30 Minutes Intravenous  Once 07/20/14 1616 07/20/14 1730      Objective: Filed Weights   07/22/14 0500 07/23/14 0500 07/24/14 0500  Weight: 58.6 kg (129 lb 3 oz) 58.9 kg (129 lb 13.6 oz) 61 kg (134 lb 7.7 oz)    Intake/Output Summary (Last 24 hours) at 07/24/14 1407 Last data filed at 07/24/14 1100  Gross per 24 hour  Intake   1677 ml  Output    300 ml  Net   1377 ml     Vitals Filed Vitals:   07/24/14 1113 07/24/14 1114 07/24/14 1131 07/24/14 1308  BP:      Pulse: 112     Temp:  100.3 F (37.9 C) 99.3 F (37.4 C) 99.4 F (37.4 C)  TempSrc:  Axillary Axillary Axillary  Resp: 27     Height:      Weight:      SpO2: 96%       Exam:  General:  Pt is alert, not  in acute distress  HEENT: No icterus, No thrush, oral mucosa moist  Cardiovascular: regular rate and rhythm, S1/S2 No murmur  Respiratory: clear to auscultation bilaterally   Abdomen: Soft, +Bowel sounds, non tender, non distended, no guarding  MSK: No LE edema, cyanosis or clubbing  Data Reviewed: Basic Metabolic Panel:  Recent Labs Lab 07/21/14 0515 07/21/14 0854 07/22/14 1645 07/23/14 0505 07/24/14 0600  NA 148* 146* 143 144 143  K 3.3* 3.5 3.1* 3.0* 2.9*  CL 107 106 102 105 104  CO2 33* 33* 33* 32 29  GLUCOSE 88 90 86 83 83  BUN 13 12 9 9 8   CREATININE 1.06 1.04 1.06 1.11 1.00  CALCIUM 8.4* 8.5* 8.3* 8.1* 8.1*   Liver Function Tests:  Recent Labs Lab 07/20/14 1629  AST 35  ALT 160*  ALKPHOS 224*  BILITOT 1.5*  PROT 6.3*  ALBUMIN 3.2*  No results for input(s): LIPASE, AMYLASE in the last 168 hours. No results for input(s): AMMONIA in the last 168 hours. CBC:  Recent Labs Lab 07/20/14 1629 07/21/14 0130 07/22/14 1645 07/22/14 1700 07/23/14 0505 07/24/14 0600  WBC 8.3 7.3 1.6* 1.7* 8.2 6.3  NEUTROABS 7.1 6.2  --   --  7.2  --   HGB 10.9* 8.3* 8.1* 8.0* 7.8* 10.5*  HCT 36.2* 27.1* 26.4* 26.2* 25.6* 33.0*  MCV 105.5* 103.8* 103.1* 102.7* 104.5* 97.1  PLT 135* 120* 111* 110* 114* 115*   Cardiac Enzymes: No results for input(s): CKTOTAL, CKMB, CKMBINDEX, TROPONINI in the last 168 hours. BNP (last 3 results) No results for input(s): BNP in the last 8760 hours.  ProBNP (last 3 results) No results for input(s): PROBNP in the last 8760 hours.  CBG:  Recent Labs Lab 07/21/14 0021  GLUCAP 92    Recent Results (from the past 240 hour(s))  Clostridium Difficile by PCR (not at [facility])     Status: None   Collection Time: 07/15/14  7:06 AM  Result Value Ref Range Status   C difficile by pcr NEGATIVE NEGATIVE Final  Respiratory virus panel     Status: None   Collection Time: 07/15/14 12:25 PM  Result Value Ref Range Status   Respiratory Syncytial  Virus A Negative Negative Final   Respiratory Syncytial Virus B Negative Negative Final   Influenza A Negative Negative Final   Influenza B Negative Negative Final   Parainfluenza 1 Negative Negative Final   Parainfluenza 2 Negative Negative Final   Parainfluenza 3 Negative Negative Final   Metapneumovirus Negative Negative Final   Rhinovirus Negative Negative Final   Adenovirus Negative Negative Final    Comment: (NOTE) Performed At: [facility] [place], [place] [id number] Lindon Romp MD [id number]   Blood Culture (routine x 2)     Status: None (Preliminary result)   Collection Time: 07/20/14  4:25 PM  Result Value Ref Range Status   Specimen Description BLOOD LEFT ARM  Final   Special Requests   Final    BOTTLES DRAWN AEROBIC AND ANAEROBIC 5CC BLUE 3CC RED   Culture NO GROWTH 3 DAYS  Final   Report Status PENDING  Incomplete  Blood Culture (routine x 2)     Status: None (Preliminary result)   Collection Time: 07/20/14  4:32 PM  Result Value Ref Range Status   Specimen Description BLOOD LEFT HAND  Final   Special Requests BOTTLES DRAWN AEROBIC ONLY 5CC  Final   Culture NO GROWTH 3 DAYS  Final   Report Status PENDING  Incomplete  Urine culture     Status: None   Collection Time: 07/20/14  4:41 PM  Result Value Ref Range Status   Specimen Description URINE, RANDOM  Final   Special Requests NONE  Final   Culture NO GROWTH 2 DAYS  Final   Report Status 07/22/2014 FINAL  Final  Clostridium Difficile by PCR (not at [facility])     Status: None   Collection Time: 07/22/14  6:00 AM  Result Value Ref Range Status   C difficile by pcr NEGATIVE NEGATIVE Final     Studies: Ir Replc Duoden/jejuno Tube Percut W/fluoro  07/22/2014   CLINICAL DATA:  24 year old male with a history of cerebral palsy has been referred for evaluation of gastrojejunostomy tube replacement.  EXAM: GASTROJEJUNOSTOMY TUBE FLUOROSCOPIC REPLACEMENT  FLUOROSCOPY TIME:  1 minutes 30  seconds  MEDICATIONS AND MEDICAL HISTORY: Versed 0 mg, Fentanyl  0 mcg.  ANESTHESIA/SEDATION: Moderate sedation time: 0 minutes  CONTRAST:  10 cc enteric contrast  PROCEDURE: The procedure, risks, benefits, and alternatives were explained to the patient's family. Questions regarding the procedure were encouraged and answered. The patient understands and consents to the procedure.  The epigastrium was prepped with Betadine in a sterile fashion, and a sterile drape was applied covering the operative field. A sterile gown and sterile gloves were used for the procedure.  Contrast was infused through the jejunostomy port of the patient's indwelling GJ tube confirming position within small bowel.  A Glidewire was navigated through the jejunostomy port into the proximal small bowel. The balloon retention was reduced by an aspirating saline, and then the gastrojejunostomy would tube was removed over the wire.  A Kumpe the catheter was then advanced over the Glidewire into the small bowel. The Glidewire was exchanged for a Bentson wire.  With a Bentson wire in place, the Kumpe was removed and a new gastrojejunostomy tube was advanced over the Bentson wire into position.  7 cc of saline was then used to inflate the retention balloon.  Small amount of contrast confirmed position of the new GJ tube in the proximal small bowel.  The patient tolerated the procedure well and remained hemodynamically stable throughout.  No complications were encountered and no significant blood loss was encountered.  Marland Kitchen  FINDINGS: New gastrojejunostomy tube with the tip in the proximal small bowel.  IMPRESSION: Status post replacement of gastrojejunostomy tube, with the new tube terminating in proximal small bowel.  Signed,  Dulcy Fanny. Earleen Newport, DO  [facility]  [facility]   Electronically Signed   By: Corrie Mckusick D.O.   On: 07/22/2014 16:45    Scheduled Meds:  Scheduled Meds: . sodium chloride    Intravenous Once  . albuterol  2.5 mg Nebulization Q6H  . antiseptic oral rinse  7 mL Mouth Rinse q12n4p  . calcium carbonate (dosed in mg elemental calcium)  1,250 mg Per Tube TID WC  . cefUROXime  250 mg Per Tube Q12H  . chlorhexidine  15 mL Mouth Rinse BID  . cloBAZam  2.5 mg Per Tube TID  . fluconazole  100 mg Oral Daily  . furosemide  20 mg Intravenous Once  . gabapentin  900 mg Per Tube TID  . heparin  5,000 Units Subcutaneous 3 times per day  . metoCLOPramide  10 mg Per Tube QID  . neomycin-bacitracin-polymyxin   Topical TID  . pantoprazole (PROTONIX) IV  40 mg Intravenous Q12H  . pseudoephedrine  30 mg Per Tube TID  . simethicone  80 mg Oral QID  . sucralfate  1 g Oral TID WC & HS   Continuous Infusions:    Time spent on care of this patient: 35 min   Kelvin Cellar, MD 07/24/2014, 2:07 PM  LOS: 4 days   [facility] Office  [phone] Pager - Text Page per [email or web address] If 7PM-7AM, please contact night-coverage [email or web address]

## 2014-07-24 NOTE — Progress Notes (Addendum)
ANTIBIOTIC CONSULT NOTE - FOLLOW UP  Pharmacy Consult:  Zosyn Indication:  Possible aspiration PNA  Allergies  Allergen Reactions  . Depakote [Divalproex Sodium]     Causes pancreatitis   . Vimpat [Lacosamide] Rash  . Keppra [Levetiracetam] Other (See Comments)    Bone marrow suppression  . Adhesive [Tape] Other (See Comments)    Rips skin off (paper tape is ok)  . Amoxicillin-Pot Clavulanate Rash    If given with fluconazole rash is not as severe or does not appear at all  . Neulasta [Pegfilgrastim] Other (See Comments)    Fever, tachycardia    Patient Measurements: Height: 4\' 2"  (127 cm) Weight: 134 lb 7.7 oz (61 kg) IBW/kg (Calculated) : 27  Vital Signs: Temp: 99.3 F (37.4 C) (06/22 1131) Temp Source: Axillary (06/22 1131) BP: 135/77 mmHg (06/22 0455) Pulse Rate: 112 (06/22 1113) Intake/Output from previous day: 06/21 0701 - 06/22 0700 In: 2362 [I.V.:975; Blood:675; NG/GT:662; IV Piggyback:50] Out: -  Intake/Output from this shift: Total I/O In: 46 [NG/GT:46] Out: 300 [Urine:300]  Labs:  Recent Labs  07/22/14 1645 07/22/14 1700 07/23/14 0505 07/24/14 0600  WBC 1.6* 1.7* 8.2 6.3  HGB 8.1* 8.0* 7.8* 10.5*  PLT 111* 110* 114* 115*  CREATININE 1.06  --  1.11 1.00   Estimated Creatinine Clearance: 66 mL/min (by C-G formula based on Cr of 1). No results for input(s): VANCOTROUGH, VANCOPEAK, VANCORANDOM, GENTTROUGH, GENTPEAK, GENTRANDOM, TOBRATROUGH, TOBRAPEAK, TOBRARND, AMIKACINPEAK, AMIKACINTROU, AMIKACIN in the last 72 hours.   Microbiology: Recent Results (from the past 720 hour(s))  Blood Culture (routine x 2)     Status: None   Collection Time: 07/13/14  6:20 AM  Result Value Ref Range Status   Specimen Description BLOOD LEFT HAND  Final   Special Requests BOTTLES DRAWN AEROBIC ONLY 3CC  Final   Culture   Final    NO GROWTH 5 DAYS Performed at [facility]    Report Status 07/19/2014 FINAL  Final  Blood Culture (routine x 2)     Status:  None   Collection Time: 07/13/14  6:25 AM  Result Value Ref Range Status   Specimen Description BLOOD RIGHT ARM  Final   Special Requests BOTTLES DRAWN AEROBIC AND ANAEROBIC 10CC  Final   Culture   Final    NO GROWTH 5 DAYS Performed at [facility]    Report Status 07/19/2014 FINAL  Final  Urine culture     Status: None   Collection Time: 07/13/14  7:00 AM  Result Value Ref Range Status   Specimen Description URINE, CLEAN CATCH  Final   Special Requests NONE  Final   Colony Count   Final    50,000 COLONIES/ML Performed at [facility]    Culture   Final    KLEBSIELLA PNEUMONIAE Performed at [facility]    Report Status 07/15/2014 FINAL  Final   Organism ID, Bacteria KLEBSIELLA PNEUMONIAE  Final      Susceptibility   Klebsiella pneumoniae - MIC*    AMPICILLIN >=32 RESISTANT Resistant     CEFAZOLIN <=4 SENSITIVE Sensitive     CEFTRIAXONE <=1 SENSITIVE Sensitive     CIPROFLOXACIN <=0.25 SENSITIVE Sensitive     GENTAMICIN <=1 SENSITIVE Sensitive     LEVOFLOXACIN 1 SENSITIVE Sensitive     NITROFURANTOIN 128 RESISTANT Resistant     TOBRAMYCIN <=1 SENSITIVE Sensitive     TRIMETH/SULFA <=20 SENSITIVE Sensitive     PIP/TAZO 16 SENSITIVE Sensitive     *  KLEBSIELLA PNEUMONIAE  MRSA PCR Screening     Status: None   Collection Time: 07/13/14  4:05 PM  Result Value Ref Range Status   MRSA by PCR NEGATIVE NEGATIVE Final    Comment:        The GeneXpert MRSA Assay (FDA approved for NASAL specimens only), is one component of a comprehensive MRSA colonization surveillance program. It is not intended to diagnose MRSA infection nor to guide or monitor treatment for MRSA infections.   Clostridium Difficile by PCR (not at [facility])     Status: None   Collection Time: 07/15/14  7:06 AM  Result Value Ref Range Status   C difficile by pcr NEGATIVE NEGATIVE Final  Respiratory virus panel     Status: None   Collection Time: 07/15/14 12:25 PM  Result Value Ref  Range Status   Respiratory Syncytial Virus A Negative Negative Final   Respiratory Syncytial Virus B Negative Negative Final   Influenza A Negative Negative Final   Influenza B Negative Negative Final   Parainfluenza 1 Negative Negative Final   Parainfluenza 2 Negative Negative Final   Parainfluenza 3 Negative Negative Final   Metapneumovirus Negative Negative Final   Rhinovirus Negative Negative Final   Adenovirus Negative Negative Final    Comment: (NOTE) Performed At: [facility] [place], [place] [id number] Lindon Romp MD [id number]   Blood Culture (routine x 2)     Status: None (Preliminary result)   Collection Time: 07/20/14  4:25 PM  Result Value Ref Range Status   Specimen Description BLOOD LEFT ARM  Final   Special Requests   Final    BOTTLES DRAWN AEROBIC AND ANAEROBIC 5CC BLUE 3CC RED   Culture NO GROWTH 3 DAYS  Final   Report Status PENDING  Incomplete  Blood Culture (routine x 2)     Status: None (Preliminary result)   Collection Time: 07/20/14  4:32 PM  Result Value Ref Range Status   Specimen Description BLOOD LEFT HAND  Final   Special Requests BOTTLES DRAWN AEROBIC ONLY 5CC  Final   Culture NO GROWTH 3 DAYS  Final   Report Status PENDING  Incomplete  Urine culture     Status: None   Collection Time: 07/20/14  4:41 PM  Result Value Ref Range Status   Specimen Description URINE, RANDOM  Final   Special Requests NONE  Final   Culture NO GROWTH 2 DAYS  Final   Report Status 07/22/2014 FINAL  Final  Clostridium Difficile by PCR (not at [facility])     Status: None   Collection Time: 07/22/14  6:00 AM  Result Value Ref Range Status   C difficile by pcr NEGATIVE NEGATIVE Final      Assessment: 23 YOM continues on day #5 of Zosyn for aspiration PNA.  Noted patient had a low grade temperature accompanied by flushing of the face and upper body, so Zosyn infusion was stopped.  Patient's renal function has been stable.  Flagyl 6/18 >>  6/21 Zosyn 6/18 >>  Vanc 6/18 >> 6/19  6/18 VR = 14 mcg/mL * VT of 16 mcg/ml on a dose of 750 mg IV every 8 hours in May with SCr range of 0.45 - 0.47  6/18 Bld x 2 - NGTD 6/18 UCx - negative 6/20 C.diff - negative   Goal of Therapy:  Resolution of infection   Plan:  - Zosyn 3.375gm IV Q8H, 4 hr infusion.  Resuming vs switching agent per MD. - Pharmacy  will sign off as dosage adjustment is unnecessary.  Thank you for the consult! - Watch plts while on heparin SQ - Consider checking Mag level    Ricke Kimoto D. Mina Marble, PharmD, BCPS Pager:  [phone] - [phone] 07/24/2014, 1:05 PM   =================================   Addendum: - change from Zosyn to Ceftin   Plan: - Agree with Ceftin 250mg  PO BID - Pharmacy will sign off.  Thank you for the consult!    Coleman Kalas D. Mina Marble, PharmD, BCPS Pager:  [phone] 07/24/2014, 1:39 PM

## 2014-07-24 NOTE — Progress Notes (Signed)
Patient had an increased temperature of 100.3 with flushing of the face and upper body with increased respirations.  Administered 480mg  Tylenol adjusted temperature in the room.  Notified physician received an order to stop the Zosyn.  Rechecked the patient the flushing has decreased and temperature is now 99.3 patient show no s/s of distress at this time.  Will continue to monitor.

## 2014-07-24 NOTE — Progress Notes (Signed)
Patient assessment completed 07/24/14. Patient scored 10, but albuterol treatment left Q6 due to slight expiratory wheeze.

## 2014-07-25 ENCOUNTER — Inpatient Hospital Stay (HOSPITAL_COMMUNITY): Payer: Medicaid Other

## 2014-07-25 DIAGNOSIS — Z87898 Personal history of other specified conditions: Secondary | ICD-10-CM

## 2014-07-25 DIAGNOSIS — Z789 Other specified health status: Secondary | ICD-10-CM | POA: Insufficient documentation

## 2014-07-25 LAB — BASIC METABOLIC PANEL
Anion gap: 7 (ref 5–15)
Anion gap: 9 (ref 5–15)
BUN: 10 mg/dL (ref 6–20)
BUN: 10 mg/dL (ref 6–20)
CO2: 34 mmol/L — AB (ref 22–32)
CO2: 32 mmol/L (ref 22–32)
Calcium: 8.4 mg/dL — ABNORMAL LOW (ref 8.9–10.3)
Calcium: 8.7 mg/dL — ABNORMAL LOW (ref 8.9–10.3)
Chloride: 106 mmol/L (ref 101–111)
Chloride: 103 mmol/L (ref 101–111)
Creatinine, Ser: 0.88 mg/dL (ref 0.61–1.24)
Creatinine, Ser: 0.93 mg/dL (ref 0.61–1.24)
GFR calc Af Amer: >60 mL/min (ref >60)
GFR calc Af Amer: >60 mL/min (ref >60)
GFR calc non Af Amer: >60 mL/min (ref >60)
GLUCOSE: 95 mg/dL (ref 65–99)
GLUCOSE: 93 mg/dL (ref 65–99)
POTASSIUM: 3.9 mmol/L (ref 3.5–5.1)
Potassium: 3.4 mmol/L — ABNORMAL LOW (ref 3.5–5.1)
SODIUM: 147 mmol/L — AB (ref 135–145)
SODIUM: 144 mmol/L (ref 135–145)

## 2014-07-25 LAB — CBC
HCT: 32.5 % — ABNORMAL LOW (ref 39.0–52.0)
Hemoglobin: 10.2 g/dL — ABNORMAL LOW (ref 13.0–17.0)
MCH: 31.1 pg (ref 26.0–34.0)
MCHC: 31.4 g/dL (ref 30.0–36.0)
MCV: 99.1 fL (ref 78.0–100.0)
Platelets: 109 10*3/uL — ABNORMAL LOW (ref 150–400)
RBC: 3.28 MIL/uL — ABNORMAL LOW (ref 4.22–5.81)
RDW: 19.7 % — ABNORMAL HIGH (ref 11.5–15.5)
WBC: 2.9 10*3/uL — ABNORMAL LOW (ref 4.0–10.5)

## 2014-07-25 LAB — CULTURE, BLOOD (ROUTINE X 2)
CULTURE: NO GROWTH
Culture: NO GROWTH

## 2014-07-25 LAB — GLUCOSE, CAPILLARY: Glucose-Capillary: 93 mg/dL (ref 65–99)

## 2014-07-25 MED ORDER — POTASSIUM CHLORIDE 10 MEQ/50ML IV SOLN
10.0000 meq | INTRAVENOUS | Status: AC
  Administered 2014-07-25 (×3): 10 meq via INTRAVENOUS
  Filled 2014-07-25 (×3): qty 50

## 2014-07-25 MED ORDER — POTASSIUM CHLORIDE 20 MEQ PO PACK
40.0000 meq | PACK | Freq: Once | ORAL | Status: AC
  Administered 2014-07-25: 40 meq via ORAL
  Filled 2014-07-25: qty 2

## 2014-07-25 MED ORDER — FUROSEMIDE 10 MG/ML IJ SOLN
40.0000 mg | Freq: Once | INTRAMUSCULAR | Status: AC
  Administered 2014-07-25: 40 mg via INTRAVENOUS
  Filled 2014-07-25: qty 4

## 2014-07-25 MED ORDER — FILGRASTIM 480 MCG/1.6ML IJ SOLN
480.0000 ug | Freq: Once | INTRAMUSCULAR | Status: AC
  Administered 2014-07-25: 480 ug via SUBCUTANEOUS
  Filled 2014-07-25: qty 1.6

## 2014-07-25 NOTE — Progress Notes (Signed)
[facility] Progress Note   [name] EGB:[id number] DOB: Mar 30, 1990 DOA: 07/20/2014 PCP: Marijean Bravo, MD  Brief narrative: Jeffery [name] is a 24 y.o. 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 The patient's guardian states that 2 nights prior to admission his abdomen became severely distended. At this point she decided to stop his tube feeds but had already given him his medications. She put his C Pap on and had to turned the oxygen level up to 100% to keep his saturation greater than 90%. In the morning when she took his C Pap off, his mouth was covered and orange solution. She states the only thing orange that she gave to him the night before was potassium and she feels that having the potassium in his mouth and requiring increased oxygen was a sign that he aspirated overnight. He was admitted on 5/18 for neutropenic fever-neutropenia/pancytopenia was thought to be secondary to Keppra-source for fever was not found He was admitted again on 6/11 with a fever of 103-he was thought to have aspirated. Chest x-ray revealed a right lower lobe "discoid atelectasis" without any other infiltrates. He was discharged home on Levaquin on 6/14.  Subjective: Patient is nonverbal, he seems to have ongoing shortness of breath, little improvement from yesterday. Caregiver at bedside  Assessment/Plan: Aspiration pneumonia? tachycardia/lactic acidosis-sepsis -the patient was previously requiring 1-2 L of oxygen at night with C Pap-after the suspected aspiration he was requiring 100% oxygen -now titrated back down to 2 L of O2 -Most recent chest x-ray was performed 07/22/2014 that showed improved aeration right lung base with left basilar atelectasis. -IV Zosyn discontinued on 07/24/2014 -Repeat chest x-ray performed on 07/25/2014 showing no segmental infiltrate or pulmonary edema. There is persistent mild left  basilar atelectasis -Continue Ceftin  Acquired pancytopenia -Counts began dropping when patient was started on Keppra recently despite stopping, counts continue to drop -Patient was evaluated by hematology during this hospitalization. He received colony-stimulating factor along with blood transfusion with 2 units of packed red blood cells.  -Patient evaluated by medical oncology who recommended dose of Neupogen today since his white count dropped to 2900  Probable drug reaction. -Patient is tolerating Ceftin thus far. I think he may have had a reaction to Zosyn  Cerebral palsy -Bedbound and has J tube- at baseline, is able to interact with family via facial gestures   Hypernatremia -Labs showing sodium level of 147  Hypokalemia -A.m. labs revealed potassium of 3.4 for which he was given additional potassium replacement today  Nutrition -According to the legal guardian, she feels that the tube is not working and she would like it to be evaluated-have requested an IR consult -X-ray suggestive of ileus however the patient does have good bowel sounds- -J tube replaced on 6/20-resumes tube feeds at 10 mL an hour and increasing to goal rate of 40 while watching residuals and well watching carefully for recurrence of aspiration- should be at his goal rate in the next 2-3 hours  Generalized convulsive epilepsy -Intolerant of a number of medications -Taking Clobazam  currently   OSA (obstructive sleep apnea) -Continue BiPAP at night- settings 7 and 12.    Cerebral palsy, quadriplegic Total care at home by legal guardian    Code Status: DO NOT RESUSCITATE Family Communication: With legal guardians "mother and father" Disposition Plan: Follow in step down unit DVT prophylaxis: Heparin Consultants: Hematology on my interventional radiology Procedures: Change of GJ tube  Antibiotics: Anti-infectives  Start     Dose/Rate Route Frequency Ordered Stop   07/24/14 1345  fluconazole  (DIFLUCAN) 40 MG/ML suspension 100 mg     100 mg Oral Daily 07/24/14 1326     07/24/14 1345  cefUROXime (CEFTIN) 250 MG/5ML suspension 250 mg     250 mg Per Tube Every 12 hours 07/24/14 1326     07/21/14 1400  metroNIDAZOLE (FLAGYL) IVPB 500 mg  Status:  Discontinued     500 mg 100 mL/hr over 60 Minutes Intravenous Every 8 hours 07/21/14 1246 07/22/14 1224   07/21/14 0800  vancomycin (VANCOCIN) IVPB 750 mg/150 ml premix  Status:  Discontinued     750 mg 150 mL/hr over 60 Minutes Intravenous  Once 07/21/14 0617 07/21/14 0618   07/21/14 0800  vancomycin (VANCOCIN) IVPB 750 mg/150 ml premix  Status:  Discontinued     750 mg 150 mL/hr over 60 Minutes Intravenous Every 12 hours 07/21/14 0618 07/21/14 1024   07/21/14 0100  piperacillin-tazobactam (ZOSYN) IVPB 3.375 g  Status:  Discontinued     3.375 g 12.5 mL/hr over 240 Minutes Intravenous Every 8 hours 07/20/14 2004 07/24/14 1325   07/20/14 2000  metroNIDAZOLE (FLAGYL) IVPB 500 mg  Status:  Discontinued     500 mg 100 mL/hr over 60 Minutes Intravenous Every 8 hours 07/20/14 1955 07/21/14 1246   07/20/14 1700  vancomycin (VANCOCIN) IVPB 1000 mg/200 mL premix     1,000 mg 200 mL/hr over 60 Minutes Intravenous  Once 07/20/14 1657 07/20/14 2052   07/20/14 1630  piperacillin-tazobactam (ZOSYN) IVPB 3.375 g     3.375 g 100 mL/hr over 30 Minutes Intravenous  Once 07/20/14 1616 07/20/14 1730      Objective: Filed Weights   07/23/14 0500 07/24/14 0500 07/25/14 0417  Weight: 58.9 kg (129 lb 13.6 oz) 61 kg (134 lb 7.7 oz) 57.834 kg (127 lb 8 oz)    Intake/Output Summary (Last 24 hours) at 07/25/14 1622 Last data filed at 07/25/14 1037  Gross per 24 hour  Intake 756.25 ml  Output   2800 ml  Net -2043.75 ml     Vitals Filed Vitals:   07/25/14 0510 07/25/14 0841 07/25/14 1256 07/25/14 1312  BP:      Pulse:      Temp:  99 F (37.2 C) 99.6 F (37.6 C)   TempSrc:  Axillary Axillary   Resp:      Height:      Weight:      SpO2: 98%  100%  97%    Exam:  General:  Pt is alert, not in acute distress  HEENT: No icterus, No thrush, oral mucosa moist  Cardiovascular: regular rate and rhythm, S1/S2 No murmur  Respiratory: clear to auscultation bilaterally   Abdomen: Soft, +Bowel sounds, non tender, non distended, no guarding  MSK: No LE edema, cyanosis or clubbing  Data Reviewed: Basic Metabolic Panel:  Recent Labs Lab 07/21/14 0854 07/22/14 1645 07/23/14 0505 07/24/14 0600 07/24/14 2017 07/25/14 0414  NA 146* 143 144 143  --  147*  K 3.5 3.1* 3.0* 2.9* 4.2 3.4*  CL 106 102 105 104  --  106  CO2 33* 33* 32 29  --  34*  GLUCOSE 90 86 83 83  --  95  BUN 12 9 9 8   --  10  CREATININE 1.04 1.06 1.11 1.00  --  0.88  CALCIUM 8.5* 8.3* 8.1* 8.1*  --  8.4*   Liver Function Tests:  Recent Labs Lab 07/20/14  1629  AST 35  ALT 160*  ALKPHOS 224*  BILITOT 1.5*  PROT 6.3*  ALBUMIN 3.2*   No results for input(s): LIPASE, AMYLASE in the last 168 hours. No results for input(s): AMMONIA in the last 168 hours. CBC:  Recent Labs Lab 07/20/14 1629 07/21/14 0130 07/22/14 1645 07/22/14 1700 07/23/14 0505 07/24/14 0600 07/25/14 0414  WBC 8.3 7.3 1.6* 1.7* 8.2 6.3 2.9*  NEUTROABS 7.1 6.2  --   --  7.2  --   --   HGB 10.9* 8.3* 8.1* 8.0* 7.8* 10.5* 10.2*  HCT 36.2* 27.1* 26.4* 26.2* 25.6* 33.0* 32.5*  MCV 105.5* 103.8* 103.1* 102.7* 104.5* 97.1 99.1  PLT 135* 120* 111* 110* 114* 115* 109*   Cardiac Enzymes: No results for input(s): CKTOTAL, CKMB, CKMBINDEX, TROPONINI in the last 168 hours. BNP (last 3 results) No results for input(s): BNP in the last 8760 hours.  ProBNP (last 3 results) No results for input(s): PROBNP in the last 8760 hours.  CBG:  Recent Labs Lab 07/21/14 0021 07/24/14 2027 07/24/14 2355 07/25/14 0418  GLUCAP 92 82 98 93    Recent Results (from the past 240 hour(s))  Blood Culture (routine x 2)     Status: None   Collection Time: 07/20/14  4:25 PM  Result Value Ref Range  Status   Specimen Description BLOOD LEFT ARM  Final   Special Requests   Final    BOTTLES DRAWN AEROBIC AND ANAEROBIC 5CC BLUE 3CC RED   Culture NO GROWTH 5 DAYS  Final   Report Status 07/25/2014 FINAL  Final  Blood Culture (routine x 2)     Status: None   Collection Time: 07/20/14  4:32 PM  Result Value Ref Range Status   Specimen Description BLOOD LEFT HAND  Final   Special Requests BOTTLES DRAWN AEROBIC ONLY 5CC  Final   Culture NO GROWTH 5 DAYS  Final   Report Status 07/25/2014 FINAL  Final  Urine culture     Status: None   Collection Time: 07/20/14  4:41 PM  Result Value Ref Range Status   Specimen Description URINE, RANDOM  Final   Special Requests NONE  Final   Culture NO GROWTH 2 DAYS  Final   Report Status 07/22/2014 FINAL  Final  Clostridium Difficile by PCR (not at [facility])     Status: None   Collection Time: 07/22/14  6:00 AM  Result Value Ref Range Status   C difficile by pcr NEGATIVE NEGATIVE Final     Studies: Dg Chest Port 1 View  07/25/2014   CLINICAL DATA:  Pulmonary edema  EXAM: PORTABLE CHEST - 1 VIEW  COMPARISON:  07/22/2014  FINDINGS: Cardiomediastinal silhouette is stable. Metallic fixation rods thoracolumbar spine again noted. No acute infiltrate or pulmonary edema. Persistent mild left basilar atelectasis. Right IJ Port-A-Cath is unchanged in position.  IMPRESSION: No segmental infiltrate or pulmonary edema. Persistent mild left basilar atelectasis.   Electronically Signed   By: Lahoma Crocker M.D.   On: 07/25/2014 07:57    Scheduled Meds:  Scheduled Meds: . sodium chloride   Intravenous Once  . albuterol  2.5 mg Nebulization Q6H  . antiseptic oral rinse  7 mL Mouth Rinse q12n4p  . calcium carbonate (dosed in mg elemental calcium)  1,250 mg Per Tube TID WC  . cefUROXime  250 mg Per Tube Q12H  . chlorhexidine  15 mL Mouth Rinse BID  . cloBAZam  2.5 mg Per Tube TID  . fluconazole  100 mg Oral  Daily  . gabapentin  900 mg Per Tube TID  . heparin  5,000 Units  Subcutaneous 3 times per day  . metoCLOPramide  10 mg Per Tube QID  . neomycin-bacitracin-polymyxin   Topical TID  . pantoprazole (PROTONIX) IV  40 mg Intravenous Q12H  . pseudoephedrine  30 mg Per Tube TID  . simethicone  80 mg Oral QID  . sucralfate  1 g Oral TID WC & HS   Continuous Infusions:    Time spent on care of this patient: 25 min   Kelvin Cellar, MD 07/25/2014, 4:22 PM  LOS: 5 days   [facility] Office  [phone] Pager - Text Page per [email or web address] If 7PM-7AM, please contact night-coverage [email or web address]

## 2014-07-25 NOTE — Progress Notes (Signed)
Apparently, there are some issues with volume overload yesterday. He did not go home. I thought he might have gone home. He still is positive by 4 L.  On his exam, he does have more crackles. He just looks more uncomfortable. His tube feeds are off. I think a chest x-ray might be helpful.  His white cell count has dropped to 2.9. We will give him a dose of Neupogen.  He's had no obvious temperature. Currently, temperature 98.7. His blood pressure is 108/66. Oxygen saturation is 98%. On his exam, his lungs do have some crackles bilaterally. Air movement sounds relatively decent. Cardiac exam regular rate and rhythm. Abdomen is not distended. He has good bowel sounds. Extremities shows some trace edema in his lower legs.  His potassium is 3.4. I think maybe worthwhile to give him some more potassium.  His platelet counts 109.  Whenever he has any issues with respect to aspiration, he does get some transient marrow suppression.  Again, I think that Neupogen will help right now.  Hopefully, he might go home today.  I will be out of town starting today. I will will be back on Monday. If any issues arise, you can notify our inpatient PA, Sharene Butters, who will then notify the on-call physician.  As always,, we will pray hard for him!!  [name]  Hebrews 12:12

## 2014-07-25 NOTE — Procedures (Signed)
Pt removed from BIPAP, placed on 3L [place] resting comfortably.

## 2014-07-25 NOTE — Procedures (Signed)
Pt placed on BIPAP by RT and given treatment.

## 2014-07-25 NOTE — Progress Notes (Signed)
UR COMPLETED  

## 2014-07-25 NOTE — Progress Notes (Signed)
Pt not on BIPAP at this time no distress noted.

## 2014-07-26 LAB — CBC
HCT: 33.2 % — ABNORMAL LOW (ref 39.0–52.0)
Hemoglobin: 10.2 g/dL — ABNORMAL LOW (ref 13.0–17.0)
MCH: 30.8 pg (ref 26.0–34.0)
MCHC: 30.7 g/dL (ref 30.0–36.0)
MCV: 100.3 fL — AB (ref 78.0–100.0)
PLATELETS: 121 10*3/uL — AB (ref 150–400)
RBC: 3.31 MIL/uL — ABNORMAL LOW (ref 4.22–5.81)
RDW: 18.9 % — AB (ref 11.5–15.5)
WBC: 6.2 10*3/uL (ref 4.0–10.5)

## 2014-07-26 LAB — GLUCOSE, CAPILLARY: Glucose-Capillary: 87 mg/dL (ref 65–99)

## 2014-07-26 MED ORDER — POTASSIUM CHLORIDE 20 MEQ PO PACK: PACK | ORAL | Status: AC

## 2014-07-26 MED ORDER — FLUCONAZOLE 40 MG/ML PO SUSR: ORAL | Status: DC

## 2014-07-26 MED ORDER — CEFUROXIME AXETIL 250 MG/5ML PO SUSR: ORAL | Status: DC

## 2014-07-26 MED ORDER — METOCLOPRAMIDE HCL 5 MG/5ML PO SOLN
10.0000 mg | Freq: Three times a day (TID) | ORAL | Status: AC
Start: 2014-07-26 — End: ?

## 2014-07-26 MED ORDER — HEPARIN SOD (PORK) LOCK FLUSH 100 UNIT/ML IV SOLN
500.0000 [IU] | INTRAVENOUS | Status: AC | PRN
  Administered 2014-07-26: 500 [IU]

## 2014-07-26 NOTE — Progress Notes (Signed)
RT Note: RT called Jeffery [name] with case management back to check on the situation and she stated that she has spoken to Dr. Coralyn Pear and gotten the situation handled and clarified. RT will continue to assist as much as possible.

## 2014-07-26 NOTE — Progress Notes (Signed)
Pt d/c home per MD order, pt's mom an dad at [facility], pt stable, parents stated all paperwork correct

## 2014-07-26 NOTE — Progress Notes (Signed)
RT Note: RT got a call from [place] with Care Management regarding Mr. Aloia. She is saying that the patients mom is confused as to the type of Bipap the patient needs to have at home. I notified Levada Dy that she needs to contact Dr. Coralyn Pear who was patients MD and saw him today to get that information. I notified her that RT is not able to recommend nor order specific settings nor devices for home use but that the MD is the one who has to address these issues. As stated in the previous note patient was already on nasal cannula this morning when I went to asess him and was maintaining well on that when I left the unit. Rt will continue to monitor.

## 2014-07-26 NOTE — Progress Notes (Signed)
RT Note: Patients mother asked about getting the patient an auto bipap at home. She is concerned that his settings have not been adjusted since the patient was 24 [address] old. Dr. Coralyn Pear walked in mid conversation and addressed the issue. He is ordering an auto bipap for the patient to use at home and is addressing other concerns the patients mother/care giver has. Patient is in no apparent distress. He was taken off of his Bipap by his mother at [phone] and placed on his nasal cannula per his mother. He is maintaining on his cannula and Rt will continue to monitor.

## 2014-07-26 NOTE — Progress Notes (Addendum)
CM called MD regarding concerns from pt's mom about bipap/ bipap settings. MD ordered auto bipap with settings for pt with discharge orders.. Referral made with [facility](pt active with [facility]) per CM.Sonia Baller with [facility]. stated pt has an auto bipap device @ home and if pt needs titrating settings,  mom needs to bring device into [facility]  office with an scheduled appointment and  pt would have to have a qualifying  diagnosis established, MD made aware  by CM. MD stated will call pt's mom and explain , and encouraged mom  to  f/u with pt's  Pulmonary medicine MD for study to obtain titratable setting Whitman Hero RN,BSN,CM [phone]

## 2014-07-26 NOTE — Progress Notes (Signed)
Discharge instruction given to patients caregiver all questions answered at this time.  Prescriptions given to caregiver.  Pt. VSS with no s/s of distress noted.  Patient stable at discharge.

## 2014-07-29 ENCOUNTER — Telehealth: Payer: Self-pay | Admitting: *Deleted

## 2014-07-29 NOTE — Telephone Encounter (Signed)
Patient discharged from the hospital last Friday. [name] wants labs drawn via [facility] this Wednesday, June 29th. He will then determine when patient needs to come into the office for an injection.   Wilma RN at [facility] called with orders for Wednesday. [name] spoke with [name] and informed her of plan.

## 2014-07-31 ENCOUNTER — Telehealth: Payer: Self-pay | Admitting: *Deleted

## 2014-07-31 NOTE — Telephone Encounter (Signed)
Patient's mother notifying us that overall patient is doing well. He has no symptoms or problems, however, he continues to have a temperature of 101.5. Home Health RN will be there later to draw labs, and Dr Jobe Igo is coming to the home for a post hospitalization visit later today. Once this office gets labs results, we will determine patient's need for neupogen. [name] aware of patient status update.

## 2014-07-31 NOTE — Discharge Summary (Addendum)
Physician Discharge Summary  [name]:[id number] DOB: [birth year] DOA: 07/20/2014  PCP: Marijean Bravo, MD  Admit date: 07/20/2014 Discharge date: 07/26/2014  Time spent: 35 minutes  Recommendations for Outpatient Follow-up:  1. Please follow up on patient's respiratory status, likely having chronic aspiration, on tube feeds 2. Home Health Services resumed on discharge.    Discharge Diagnoses:  Principal Problem:   Aspiration pneumonia Active Problems:   Neuromuscular scoliosis   Generalized convulsive epilepsy   OSA (obstructive sleep apnea)   Cerebral palsy, quadriplegic   Anemia of chronic disease   Sepsis   Erythema, possible cellulitis   Enteritis   Palliative care encounter   Abdominal distention   Encounter for gastrojejunal ([place]) tube placement   Discharge Condition: Stable  Diet recommendation: Resume tube feeds  Filed Weights   07/23/14 0500 07/24/14 0500 07/25/14 0417  Weight: 58.9 kg (129 lb 13.6 oz) 61 kg (134 lb 7.7 oz) 57.834 kg (127 lb 8 oz)    History of present illness:  Jeffery [name] is a 24 y.o. male with Past medical history of cerebral palsy with quadriplegia, obstructive sleep apnea daily at bedtime CPAP, recurrent aspiration pneumonia status post G-tube placement, scoliosis, chronic possible diastolic dysfunction. The patient is presenting with possible episode of aspiration as well as hypoxia. The patient has a long-standing history of cerebral palsy and has recurrent admissions to the hospital with various issues. Patient was recently hospitalized for sepsis probably secondary to aspiration pneumonia and was treated with Levaquin and discharged home. It was discussed with the family to discuss with their PCP about possible hospice. After going home patient was initially at his baseline but later on started becoming more weak and more lethargic. Last night he was felt more short of breath and his CPAP oxygenation was increased to 100% by  his mother who is also an RN to maintain saturations above 90%. This morning when she was doing routine care for the patient she found that the medication that he received from the G-tube was coating patient's mouth with suspicion for possible aspiration. With this the patient was being more and more lethargic and therefore was brought to the hospital. Patient also has distended abdomen since last to 3 days. No diarrhea at present. Distention has improved since his arrival. Patient also has chronic leg swelling which is unchanged or family. Patient has that without a new red rash involving his knee as well as bilateral hand since his arrival.   Hospital Course:  [name] is a 24 y.o. 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 The patient's guardian states that 2 nights prior to admission his abdomen became severely distended. At this point she decided to stop his tube feeds but had already given him his medications. She put his C Pap on and had to turned the oxygen level up to 100% to keep his saturation greater than 90%. In the morning when she took his C Pap off, his mouth was covered and orange solution. She states the only thing orange that she gave to him the night before was potassium and she feels that having the potassium in his mouth and requiring increased oxygen was a sign that he aspirated overnight. He was admitted on 5/18 for neutropenic fever-neutropenia/pancytopenia was thought to be secondary to Keppra-source for fever was not found He was admitted again on 6/11 with a fever of 103-he was thought to have aspirated. Chest x-ray revealed a  right lower lobe "discoid atelectasis" without any other infiltrates. He was discharged home on Levaquin on 6/14.  Aspiration pneumonia? tachycardia/lactic acidosis-sepsis -the patient was previously requiring 1-2 L of oxygen at night with C Pap-after the suspected  aspiration he was requiring 100% oxygen -now titrated back down to 2 L of O2 -Most recent chest x-ray was performed 07/22/2014 that showed improved aeration right lung base with left basilar atelectasis. -IV Zosyn discontinued on 07/24/2014 -Repeat chest x-ray performed on 07/25/2014 showing no segmental infiltrate or pulmonary edema. There is persistent mild left basilar atelectasis -Given h/o of cerebral palsy and tube feed requirement I think he's at a high risk for having future aspirations. I discussed this with his caregiver who understands this possibility.  -Discharged on Ceftin  Acquired pancytopenia -Counts began dropping when patient was started on Keppra recently despite stopping, counts continue to drop -Patient was evaluated by hematology during this hospitalization. He received colony-stimulating factor along with blood transfusion with 2 units of packed red blood cells.  -Patient evaluated by medical oncology as he was given Neupogen during this hospitalization  Probable drug reaction. -Patient is tolerating Ceftin thus far. I think he may have had a reaction to Zosyn  Cerebral palsy -Bedbound and has J tube- at baseline, is able to interact with family via facial gestures   Hypernatremia -Labs showing sodium level of 147  Hypokalemia -A.m. labs revealed potassium of 3.4 for which he was given additional potassium replacement today  Nutrition -According to the legal guardian, she feels that the tube is not working and she would like it to be evaluated-have requested an IR consult -X-ray suggestive of ileus however the patient does have good bowel sounds- -J tube replaced on 6/20-resumes tube feeds at 10 mL an hour and increasing to goal rate of 40 while watching residuals and well watching carefully for recurrence of aspiration- should be at his goal rate in the next 2-3 hours  Generalized convulsive epilepsy -Intolerant of a number of medications -Taking Clobazam  currently   OSA (obstructive sleep apnea) -Continue BiPAP at night- settings 7 and 12.    Cerebral palsy, quadriplegic Total care at home by legal guardian    Discharge Exam: Filed Vitals:   07/26/14 0750  BP:   Pulse:   Temp: 100.1 F (37.8 C)  Resp:     General: Pt is alert, not in acute distress  HEENT: No icterus, No thrush, oral mucosa moist  Cardiovascular: regular rate and rhythm, S1/S2 No murmur  Respiratory: clear to auscultation bilaterally   Abdomen: Soft, +Bowel sounds, non tender, non distended, no guarding  MSK: No LE edema, cyanosis or clubbing  Discharge Instructions   Discharge Instructions    Call MD for:  difficulty breathing, headache or visual disturbances    Complete by:  As directed      Call MD for:  extreme fatigue    Complete by:  As directed      Call MD for:  hives    Complete by:  As directed      Call MD for:  persistant dizziness or light-headedness    Complete by:  As directed      Call MD for:  persistant nausea and vomiting    Complete by:  As directed      Call MD for:  redness, tenderness, or signs of infection (pain, swelling, redness, odor or green/yellow discharge around incision site)    Complete by:  As directed  Call MD for:  severe uncontrolled pain    Complete by:  As directed      Call MD for:  temperature >100.4    Complete by:  As directed      Call MD for:    Complete by:  As directed      Diet - low sodium heart healthy    Complete by:  As directed      Increase activity slowly    Complete by:  As directed           Discharge Medication List as of 07/26/2014  8:31 AM    START taking these medications   Details  cefUROXime (CEFTIN) 250 MG/5ML suspension 250 mg per tube BID for 6 days. QTY sufficient, Print    fluconazole (DIFLUCAN) 40 MG/ML suspension Please 100 mg Per Tube daily for 6 days. Qty Suficient, Print      CONTINUE these medications which have CHANGED   Details  metoCLOPramide  (REGLAN) 5 MG/5ML solution Place 10 mLs (10 mg total) into feeding tube 4 (four) times daily -  before meals and at bedtime., Starting 07/26/2014, Until Discontinued, Print    potassium chloride (KLOR-CON) 20 MEQ packet Give 20 meq daily except twice daily with lasix, given through J port, Print      CONTINUE these medications which have NOT CHANGED   Details  acetaminophen (TYLENOL) 160 MG/5ML solution 480 mg by Gastric Tube route every 6 (six) hours as needed for fever., Until Discontinued, Historical Med    acetaminophen (TYLENOL) 80 MG suppository Place 480 mg rectally every 4 (four) hours as needed for fever., Until Discontinued, Historical Med    albuterol (PROVENTIL HFA;VENTOLIN HFA) 108 (90 BASE) MCG/ACT inhaler Inhale 2 puffs into the lungs every 4 (four) hours as needed for shortness of breath., Until Discontinued, Historical Med    albuterol (PROVENTIL) (2.5 MG/3ML) 0.083% nebulizer solution Take 2.5 mg by nebulization See admin instructions. Give 1 vial (2.5 mg) twice daily (8am and 8pm) and every 4 hours as needed for shortness of breath or wheezing, Starting 10/11/2012, Until Discontinued, Historical Med    Amino Acids-Protein Hydrolys (FEEDING SUPPLEMENT, PRO-STAT SUGAR FREE 64,) LIQD Take 30 mLs by mouth 2 (two) times daily., Starting 06/27/2014, Until Discontinued, Print    baclofen (GABLOFEN) 40000 MCG/20ML SOLN by Intrathecal route continuous. 385.2 mcg in 24 hours, Starting 05/04/2012, Until Discontinued, Historical Med    bag balm OINT ointment Apply 1 application topically See admin instructions. Apply to sacral area with each diaper change, Until Discontinued, Historical Med    calcium carbonate, dosed in mg elemental calcium, 1250 MG/5ML 1,250 mg by Gastric Tube route 3 (three) times daily. 8am , 2pm, and 8pm, Until Discontinued, Historical Med    cloBAZam (ONFI) 2.5 MG/ML solution Place 2.5 mL in gastrostomy tube 3 times daily, Print    DIASTAT ACUDIAL 20 MG GEL Place  12.5 mg rectally as needed (for seizure lasting 2 minutes or longer or repetitive seizures - no more than 1 dose in 12 hours (not given with nasal versed)). , Starting 04/01/2014, Until Discontinued, Historical Med    dicyclomine (BENTYL) 10 MG/5ML syrup 10 mg by Gastric Tube route every 8 (eight) hours as needed (abdominal cramps). Do not exceed 5 days in one week, Until Discontinued, Historical Med    diphenoxylate-atropine (LOMOTIL) 2.5-0.025 MG/5ML liquid 10 mLs by Gastric Tube route every 6 (six) hours as needed for diarrhea or loose stools. , Until Discontinued, Historical Med    folic  acid (FOLVITE) 1 MG tablet 1 mg by Gastric Tube route daily. 8:00am per G tube, Until Discontinued, Historical Med    furosemide (LASIX) 10 MG/ML solution 20 mg by Gastric Tube route daily as needed for fluid or edema. Maximum 3 times week, Until Discontinued, Historical Med    gabapentin (NEURONTIN) 250 MG/5ML solution Take 18 mL 3 times daily, Normal    ibuprofen (ADVIL,MOTRIN) 100 MG/5ML suspension 400 mg by Gastric Tube route every 6 (six) hours as needed for fever (pain). , Until Discontinued, Historical Med    ketoconazole (NIZORAL) 2 % cream Apply 1 application topically 2 (two) times daily as needed for irritation (yeast rash from antibiotics). , Until Discontinued, Historical Med    lansoprazole (PREVACID SOLUTAB) 30 MG disintegrating tablet 30 mg by Gastric Tube route 2 (two) times daily. 7:30am and 7:30pm, Starting 09/07/2012, Until Discontinued, Historical Med    midazolam (VERSED) 5 MG/ML injection Place 2 mLs (10 mg total) into the nose once. Draw up 29ml in 2 syringes. Remove blue vial access device. Attach syringe to nasal atomizer for intranasal administration. Give 27ml in right nostril x 2 for seizures lasting 2 minutes or longer or for repetit ive seizures in a short period of time., Starting 11/05/2013, Print    Multiple Vitamin (MULTIVITAMIN) LIQD 10 mLs by Gastric Tube route daily., Until  Discontinued, Historical Med    Multiple Vitamins-Minerals (ZINC PO) 5 mLs by Gastric Tube route daily. 244 mg / 5 mL zinc solution compounded by [facility], Until Discontinued, Historical Med    mupirocin ointment (BACTROBAN) 2 % Apply 1 application topically as needed (to G-T site)., Starting 09/07/2012, Until Discontinued, Normal    Nutritional Supplements (TWOCAL HN) LIQD 237 mLs by Gastric Tube route See admin instructions. T.3 can at 46 cc hour x 12 hours - water 172 cc pre and post each and extra 172 bid., Until Discontinued, Historical Med    OnabotulinumtoxinA (BOTOX IJ) Inject 300 Units as directed See admin instructions. Every 3 months, Until Discontinued, Historical Med    Pseudoephedrine HCl (SUDAFED CHILDRENS) 15 MG/5ML LIQD 30 mg by Gastric Tube route 3 (three) times daily. 6am, 2pm, 8pm, Until Discontinued, Historical Med    simethicone (MYLICON) 215 MG chewable tablet 125 mg by Gastric Tube route See admin instructions. 125mg  caplet added to each can of Two Cal 3 times daily, Until Discontinued, Historical Med    sodium phosphate (FLEET) enema Place 1 enema rectally daily as needed (gas buildup/ bloating). , Until Discontinued, Historical Med    sucralfate (CARAFATE) 1 GM/10ML suspension 1 g by Gastric Tube route 4 (four) times daily. 7am, 12pm, 5pm, 7pm Administer alone 30 minutes prior to other medications., Until Discontinued, Historical Med    Vitamin Mixture (VITAMIN C) LIQD 300 mLs by Gastric Tube route daily., Until Discontinued, Historical Med    Water For Irrigation, Sterile (FREE WATER) SOLN 172 mLs by Gastric Tube route 4 (four) times daily. , Until Discontinued, Historical Med    Zinc Oxide (BALMEX EX) Apply 1 application topically See admin instructions. Apply to sacral with each diaper change, Until Discontinued, Historical Med    OXYGEN-HELIUM IN Inhale into the lungs. Oxygen PRN to keep O2 Sat at 90%, Until Discontinued, Historical Med      STOP taking  these medications     levofloxacin (LEVAQUIN) 500 MG tablet        Allergies  Allergen Reactions  . Depakote [Divalproex Sodium]     Causes pancreatitis   .  Vimpat [Lacosamide] Rash  . Keppra [Levetiracetam] Other (See Comments)    Bone marrow suppression  . Adhesive [Tape] Other (See Comments)    Rips skin off (paper tape is ok)  . Amoxicillin-Pot Clavulanate Rash    If given with fluconazole rash is not as severe or does not appear at all  . Neulasta [Pegfilgrastim] Other (See Comments)    Fever, tachycardia      The results of significant diagnostics from this hospitalization (including imaging, microbiology, ancillary and laboratory) are listed below for reference.    Significant Diagnostic Studies: Ir Replc Duoden/jejuno Tube Percut W/fluoro  07/22/2014   CLINICAL DATA:  24 year old male with a history of cerebral palsy has been referred for evaluation of gastrojejunostomy tube replacement.  EXAM: GASTROJEJUNOSTOMY TUBE FLUOROSCOPIC REPLACEMENT  FLUOROSCOPY TIME:  1 minutes 30 seconds  MEDICATIONS AND MEDICAL HISTORY: Versed 0 mg, Fentanyl 0 mcg.  ANESTHESIA/SEDATION: Moderate sedation time: 0 minutes  CONTRAST:  10 cc enteric contrast  PROCEDURE: The procedure, risks, benefits, and alternatives were explained to the patient's family. Questions regarding the procedure were encouraged and answered. The patient understands and consents to the procedure.  The epigastrium was prepped with Betadine in a sterile fashion, and a sterile drape was applied covering the operative field. A sterile gown and sterile gloves were used for the procedure.  Contrast was infused through the jejunostomy port of the patient's indwelling GJ tube confirming position within small bowel.  A Glidewire was navigated through the jejunostomy port into the proximal small bowel. The balloon retention was reduced by an aspirating saline, and then the gastrojejunostomy would tube was removed over the wire.  A Kumpe  the catheter was then advanced over the Glidewire into the small bowel. The Glidewire was exchanged for a Bentson wire.  With a Bentson wire in place, the Kumpe was removed and a new gastrojejunostomy tube was advanced over the Bentson wire into position.  7 cc of saline was then used to inflate the retention balloon.  Small amount of contrast confirmed position of the new GJ tube in the proximal small bowel.  The patient tolerated the procedure well and remained hemodynamically stable throughout.  No complications were encountered and no significant blood loss was encountered.  Marland Kitchen  FINDINGS: New gastrojejunostomy tube with the tip in the proximal small bowel.  IMPRESSION: Status post replacement of gastrojejunostomy tube, with the new tube terminating in proximal small bowel.  Signed,  Dulcy Fanny. Earleen Newport, DO  [facility]  [facility]   Electronically Signed   By: Corrie Mckusick D.O.   On: 07/22/2014 16:45   Dg Chest Port 1 View  07/25/2014   CLINICAL DATA:  Pulmonary edema  EXAM: PORTABLE CHEST - 1 VIEW  COMPARISON:  07/22/2014  FINDINGS: Cardiomediastinal silhouette is stable. Metallic fixation rods thoracolumbar spine again noted. No acute infiltrate or pulmonary edema. Persistent mild left basilar atelectasis. Right IJ Port-A-Cath is unchanged in position.  IMPRESSION: No segmental infiltrate or pulmonary edema. Persistent mild left basilar atelectasis.   Electronically Signed   By: Lahoma Crocker M.D.   On: 07/25/2014 07:57   Dg Chest Port 1 View  07/22/2014   CLINICAL DATA:  Hypoxia.  EXAM: PORTABLE CHEST - 1 VIEW  COMPARISON:  07/20/2014  FINDINGS: Right-sided Port-A-Cath remains in place with tip overlying the lower SVC. Cardiac silhouette is within normal limits for size. There is improved aeration of the right lower lung with minimal residual opacity in the right midlung, likely  subsegmental atelectasis. Mild left basilar opacity persists, also most suggestive of  atelectasis. No sizable pleural effusion or pneumothorax is identified. Posterior spinal fusion rods are again noted.  IMPRESSION: Improved aeration of the right lung base. Mild left basilar atelectasis.   Electronically Signed   By: Logan Bores   On: 07/22/2014 07:50   Dg Chest Port 1 View  07/20/2014   CLINICAL DATA:  Respiratory distress, fever.  EXAM: PORTABLE CHEST - 1 VIEW  COMPARISON:  July 13, 2014.  FINDINGS: Harrington rods are again noted. Right-sided Port-A-Cath is unchanged in position. No pneumothorax is noted. Cardiomediastinal silhouette is unchanged compared to prior exam. Left lung is clear. 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 Continued elevation of right hemidiaphragm is noted. Some degree of mild right pleural effusion appears to be present. Right basilar atelectasis noted on prior exam is not well visualized presumably due to effusion. New linear opacity is noted in right upper lobe concerning for subsegmental atelectasis.  IMPRESSION: There appears to be interval development of mild right pleural effusion ; underlying basilar atelectasis or infiltrate cannot be excluded. New mild linear opacity is noted in right upper lobe concerning for subsegmental atelectasis.   Electronically Signed   By: Marijo Conception, M.D.   On: 07/20/2014 16:30   Dg Chest Port 1 View  07/13/2014   CLINICAL DATA:  Respiratory distress. History of spastic quadriplegia/cerebral palsy.  EXAM: PORTABLE CHEST - 1 VIEW  COMPARISON:  Chest radiograph Jun 19, 2014  FINDINGS: Cardiomediastinal silhouette is unremarkable for this habitus limited examination with low lung volumes. Bandlike density RIGHT lower lobe with elevated RIGHT hemidiaphragm. No pneumothorax. Single lumen RIGHT chest Port-A-Cath with distal tip projecting distal superior vena cava, unchanged. No pneumothorax. Thoracolumbar Harrington rods.  IMPRESSION: RIGHT lower lobe discoid atelectasis.   Electronically Signed   By: Elon Alas M.D.    On: 07/13/2014 06:18   Dg Abd Portable 1v  07/21/2014   CLINICAL DATA:  Abdominal distention  EXAM: PORTABLE ABDOMEN - 1 VIEW  COMPARISON:  None.  FINDINGS: Medication pump projects over the right lower quadrant. Harrington fusion rods. Bony deformity of the thorax, spine, and pelvis reidentified. Mild gaseous distention of multiple loops of bowel over the abdomen noted diffusely, not significantly changed. Linear right lower lobe scarring or atelectasis. Presence or absence of air-fluid levels or free air is suboptimally evaluated on this supine projection. Feeding tube suboptimally visualized over the mid abdomen.  IMPRESSION: Mild gaseous distention of multiple loops of bowel over the mid abdomen which is nonspecific but could represent ileus.   Electronically Signed   By: Conchita Paris M.D.   On: 07/21/2014 11:14   Dg Abd Portable 1v  07/20/2014   CLINICAL DATA:  Abdominal distention  EXAM: PORTABLE ABDOMEN - 1 VIEW  COMPARISON:  Jun 21, 2014  FINDINGS: There is mild bowel dilatation in a pattern suggesting ileus or possibly a degree of enteritis. Obstruction is not felt to be likely. No free air is seen on this supine examination. There is extensive postoperative change in the visualized thoracic and lumbar spine regions. A stimulator is seen overlying the right lower quadrant with the lead extending into the spinal region. Visualized lung bases are clear. There is a small phlebolith in the pelvis. Gastrostomy catheter is seen in the mid abdomen.  IMPRESSION: The bowel gas pattern suggests early ileus or possibly enteritis. Obstruction not felt to be likely. No free air.   Electronically Signed   By: Gwyndolyn Saxon  Jasmine December III M.D.   On: 07/20/2014 16:49    Microbiology: Recent Results (from the past 240 hour(s))  Clostridium Difficile by PCR (not at [facility])     Status: None   Collection Time: 07/22/14  6:00 AM  Result Value Ref Range Status   C difficile by pcr NEGATIVE NEGATIVE Final      Labs: Basic Metabolic Panel:  Recent Labs Lab 07/24/14 2017 07/25/14 0414 07/25/14 1634  NA  --  147* 144  K 4.2 3.4* 3.9  CL  --  106 103  CO2  --  34* 32  GLUCOSE  --  95 93  BUN  --  10 10  CREATININE  --  0.88 0.93  CALCIUM  --  8.4* 8.7*   Liver Function Tests: No results for input(s): AST, ALT, ALKPHOS, BILITOT, PROT, ALBUMIN in the last 168 hours. No results for input(s): LIPASE, AMYLASE in the last 168 hours. No results for input(s): AMMONIA in the last 168 hours. CBC:  Recent Labs Lab 07/25/14 0414 07/26/14 0435  WBC 2.9* 6.2  HGB 10.2* 10.2*  HCT 32.5* 33.2*  MCV 99.1 100.3*  PLT 109* 121*   Cardiac Enzymes: No results for input(s): CKTOTAL, CKMB, CKMBINDEX, TROPONINI in the last 168 hours. BNP: BNP (last 3 results) No results for input(s): BNP in the last 8760 hours.  ProBNP (last 3 results) No results for input(s): PROBNP in the last 8760 hours.  CBG:  Recent Labs Lab 07/24/14 2027 07/24/14 2355 07/25/14 0418 07/26/14 0423  GLUCAP 82 98 93 87       Signed:  Alisson Rozell  [facility] 07/31/2014, 12:04 PM

## 2014-08-01 ENCOUNTER — Other Ambulatory Visit: Payer: Self-pay

## 2014-08-01 ENCOUNTER — Telehealth: Payer: Self-pay | Admitting: *Deleted

## 2014-08-01 DIAGNOSIS — D61818 Other pancytopenia: Secondary | ICD-10-CM

## 2014-08-01 DIAGNOSIS — M25559 Pain in unspecified hip: Secondary | ICD-10-CM

## 2014-08-01 MED ORDER — GABAPENTIN 250 MG/5ML PO SOLN: ORAL | Status: DC

## 2014-08-01 MED ORDER — FILGRASTIM 480 MCG/1.6ML IJ SOLN: 480.0000 ug | INTRAMUSCULAR | Status: DC

## 2014-08-01 NOTE — Telephone Encounter (Signed)
[name] reviewed labs obtained from [facility] Nurse and wants patient to come in for a Neupogen injection.   [name] would also like to see if insurance will cover medication for outpatient use. Prescription will be sent to the downstairs pharmacy to start the process.   [name] aware of appointment and prescription.

## 2014-08-02 ENCOUNTER — Telehealth: Payer: Self-pay | Admitting: *Deleted

## 2014-08-02 NOTE — Telephone Encounter (Signed)
Notified Jeffery [name] that Neupogen prescription is ready for pick up from downstairs pharmacy. She would like to administer the neupogen for this weeks dose herself instead of bringing him to the office. [name] is fine with this. She will call the office with any needs or questions.

## 2014-08-06 ENCOUNTER — Telehealth: Payer: Self-pay | Admitting: Hematology & Oncology

## 2014-08-06 ENCOUNTER — Telehealth: Payer: Self-pay | Admitting: *Deleted

## 2014-08-06 NOTE — Telephone Encounter (Signed)
Lt mess regarding 7/15 appt at 1:15

## 2014-08-06 NOTE — Telephone Encounter (Signed)
[name] would like to know when patient needs to be seen in the office again, and which day this week his home healthcare nurse needs to draw labs.  [name] would like to see him next week, and his labs can be drawn today when the home health RN makes her scheduled visit. Notified patient, scheduler and [place].

## 2014-08-07 ENCOUNTER — Telehealth: Payer: Self-pay | Admitting: *Deleted

## 2014-08-07 NOTE — Telephone Encounter (Signed)
[name] reviewed labs and patient does not need a neupogen injection today. Spoke to [place] and communicated to her that [place] doesn't need injection. She understood. We will follow up next week when they come into the office.

## 2014-08-08 ENCOUNTER — Encounter: Payer: Self-pay | Admitting: Family

## 2014-08-08 ENCOUNTER — Telehealth: Payer: Self-pay | Admitting: Neurology

## 2014-08-08 ENCOUNTER — Encounter: Payer: Self-pay | Admitting: *Deleted

## 2014-08-08 DIAGNOSIS — G808 Other cerebral palsy: Secondary | ICD-10-CM

## 2014-08-12 ENCOUNTER — Ambulatory Visit (INDEPENDENT_AMBULATORY_CARE_PROVIDER_SITE_OTHER): Payer: Medicaid Other | Admitting: Neurology

## 2014-08-12 ENCOUNTER — Encounter: Payer: Self-pay | Admitting: Neurology

## 2014-08-12 VITALS — BP 118/71 | HR 113

## 2014-08-12 DIAGNOSIS — G808 Other cerebral palsy: Secondary | ICD-10-CM | POA: Diagnosis not present

## 2014-08-12 NOTE — Progress Notes (Signed)
HPI:   Jeffery [name] came in for EMG guided BOTOX injection for bilateral upper extremity spasticity, accompanied by his mother.  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 His mother adopt him and his twin sister since 68 months old.  He is wheelchair bound, depend on PEG tube feeding, he can communicate some with facial expression, but non verbal. He has severe nonfunctional spastic qudraplegia, s/p baclofen bump placement at [facility]. Dr Gaynell Face refills his Baclofen bump, which helps his lower extremity spasticity, it is much easier for care giver to change his diaper, other wise he has frequent bilateral lower extremity spasticity, clonus, and hip pain.  He has been receiving BOTOX A injection from [facility] Dr. Haynes Kerns since 2000, every 3-4 months, it has been very helpful, his upper extremity is more relaxed, it is easier to put on cloth and clean his hands after injection, last injection by Dr. Haynes Kerns was in 09/26/2009, 300 units were injected into bilateral biceps, brachioradialis, pronator teres, ECU, FDP, mother wants to change care locally for convience.  Mother reported benefit usually lasts less than 3 months, she would then noticed increased difficulty to clean his hands, he also had some improvement with his grip with BOTOX A,   He also has bilateral forearm deep tendon release surgery for his bilateral hands finger flexions in the summer of 2014  May 09 2014: Last EMG guided Botox injection was seen November 2015, he has developed pancytopenia,end of 2015, had extensive hematology workup, was eventually corrected by with draw  Keppra and Vimpat, with bone marrow stimulation medications  He is now bounced back nicely to his baseline, mother noticed increased bilateral upper extremity spasticity,  especially his left hand, he used to use his left hand to hold his Freida Busman  He is now taking Onfi for his seizure, continue has multiple recurrent seizure each week, this has been fairly stable, his seizure is under the care of Dr. Gaynell Face,  Update August 12 2014: [name] in on DNR now, he was admitted to hospital for bone marrow suppression, recurrent pneumonia, previous electrical stimulation guided BOTOX injection has been very helpful, his hand is much easier to be cleaned, is also easier to put cloth on him  Physical Exam     Head:   microcephaly, dysmorphic features  Neurologic Exam  Mental Status: sits in wheelchair, mute, does not follow commands Cranial Nerves:  symmetric facial grimace.  Motor:  He has fixed contraction of bilateral elbow, only few upper extremity spontaneous activities, well healed bilateral forearm scar, fixed forceful finger flexion of bilateral hand.  Bilateral lower extrmity has moderate spasticity on passive movement, fixed contraction of bilateral knee joints, no spontaneous movement of bilateral lower extremity. Coordination:  cannot test Gait and Station:  wheelchair-bound   Assessement and Plan:  24 yo male with cerebral palsy, severe mental retardation, epilepsy  Electrical stimulation guided, BOTOX A injection for bilateral upper extremity spasticity, he has Baclofen pump, which has helped his lower extremity spasticity.  300 units were used, 100 units were dissovled into 2 cc of NS.   Right biceps 25 units Right brachialis 25x2=50units Right pronator teres 25units Right flexor digitorum profundus 25 Right flexor digitorum superficialis 25    Left brachialis 25x2=50 units   Left pronator teres 25 units  Left biceps 25 units  Left flexor digital profundus 25  Left flexor digitorum  superficialis 25 units   He tolerate the injection very well, will return to clinic in 3 months, for repeat injection.  Marcial Pacas, M.D. Ph.D.  [facility] [place], [place] [address] Phone: [phone] Fax:      [phone]

## 2014-08-12 NOTE — Progress Notes (Signed)
**  Botox [id number], exp 03/2017**mck,rn

## 2014-08-14 ENCOUNTER — Telehealth: Payer: Self-pay | Admitting: *Deleted

## 2014-08-14 NOTE — Telephone Encounter (Signed)
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 She has been instructed to keep Jeffery [name] in bed as much as possible, and doesn't want him to be in the wheelchair unless necessary. Jeffery [name] wants to know if she can cancel the scheduled appointment for this Friday, and just have labs drawn by home health. Discussed with [name] who is fine with this plan. Weekly lab orders obtained. Lorrin Mais, [facility] RN, called at [phone] and orders for weekly labs relayed. Labs will be drawn today.

## 2014-08-16 ENCOUNTER — Ambulatory Visit: Payer: Medicaid Other | Admitting: Hematology & Oncology

## 2014-08-16 ENCOUNTER — Telehealth: Payer: Self-pay

## 2014-08-16 ENCOUNTER — Ambulatory Visit: Payer: Medicaid Other

## 2014-08-16 ENCOUNTER — Other Ambulatory Visit: Payer: Medicaid Other

## 2014-08-16 NOTE — Telephone Encounter (Signed)
Spoke with pt's guardian, [name] via phone. Per [name], [name] is to administer Neupogen today. Verbalizes understanding.   Per [name], [name]'s wound is improving. [name] aware. dph

## 2014-08-20 ENCOUNTER — Telehealth: Payer: Self-pay | Admitting: *Deleted

## 2014-08-20 NOTE — Telephone Encounter (Signed)
Spoke to [place] at [facility].  Explained to her that it would be optimal for Jeffery [name] to have his labwork drawn on Wednesday and get results back so patient may not have to come in to office on Thursday if labs are ok.  Nira Conn states this can be done on Wed and that stat labs take 5 hours but should have back by Thursday morning.

## 2014-08-21 ENCOUNTER — Encounter: Payer: Self-pay | Admitting: Nurse Practitioner

## 2014-08-21 NOTE — Progress Notes (Signed)
Spoke with mother and informed her Dr. Marin Olp has reviewed his homecare labs and everything looks great. She verbalized the understanding but states she will still give him the Neulasta injection at home. His appointments will be cancelled for this week as his mother states his wound is 75% better, but the heat causes some breathing difficulty and she would be more comfortable not bringing him in at this time. Dr. Marin Olp is aware and mother will call back in the next few weeks regarding appointments.

## 2014-08-22 ENCOUNTER — Ambulatory Visit: Payer: Medicaid Other | Admitting: Hematology & Oncology

## 2014-08-23 ENCOUNTER — Other Ambulatory Visit: Payer: Medicaid Other

## 2014-08-26 ENCOUNTER — Telehealth: Payer: Self-pay | Admitting: *Deleted

## 2014-08-26 NOTE — Telephone Encounter (Signed)
Jeffery [name] would like to know what the plan is going forward. Currently there are orders for home health to draw labs weekly. Per Larene [place] is doing well without any significant issues. [name] wants Jeffery [name] to continue to get weekly labs at home, and once the office reviews labs, a decision can then be made for whether or not Jeffery [name] will administer Neupogen.   Attempted x 3 to call [name] back with no response.

## 2014-08-28 ENCOUNTER — Other Ambulatory Visit: Payer: Medicaid Other

## 2014-08-28 ENCOUNTER — Telehealth: Payer: Self-pay | Admitting: Hematology & Oncology

## 2014-08-28 ENCOUNTER — Telehealth: Payer: Self-pay | Admitting: *Deleted

## 2014-08-28 ENCOUNTER — Telehealth: Payer: Self-pay | Admitting: Family

## 2014-08-28 DIAGNOSIS — D61818 Other pancytopenia: Secondary | ICD-10-CM

## 2014-08-28 MED ORDER — FILGRASTIM 480 MCG/1.6ML IJ SOLN: 480.0000 ug | INTRAMUSCULAR | Status: DC

## 2014-08-28 NOTE — Telephone Encounter (Signed)
Received labs from [facility]. Reviewed with [name]. [name] would like Jeffery [name] to receive Neupogen. Called [place] and she's aware.

## 2014-08-28 NOTE — Telephone Encounter (Signed)
I received a message from [facility] about [place]. The message was very difficult to hear and understand. From what I could understand, she said that [place] had 2 short seizures in the past week and that she needed 2 letters - one to be excused from jury duty and one because of missed appointments. I left her a message shortly after her phone message and again at 430pm. I will call her again tomorrow. [name] can be reached at [phone]. TG

## 2014-08-28 NOTE — Telephone Encounter (Signed)
Jeffery [name] is unhappy with [place]. She doesn't want to rely on them drawing [name]'s lab work today, and would like him to come into the office to have it done. Spoke with [name] who is okay with this. Appointment made. [name] aware.  Jeffery [name] called back a few minutes later and stated home health was coming out to draw labs. She does want to set up weekly labs with [place].   Also confirmed refill location for his Neupogen. Refill order sent.

## 2014-08-28 NOTE — Telephone Encounter (Signed)
Spoke with father regarding appt time and dates. Dates changed to Thurs at 8:30

## 2014-08-29 NOTE — Telephone Encounter (Signed)
I attempted to call Mom and received a message saying that the mailbox was full. I will try again later. TG

## 2014-09-02 NOTE — Telephone Encounter (Signed)
I called and talked to Mom today. She said that she no longer needed the letters and that [name] was doing well. She had no other questions or concerns today. TG

## 2014-09-03 ENCOUNTER — Other Ambulatory Visit: Payer: Self-pay | Admitting: Hematology & Oncology

## 2014-09-03 ENCOUNTER — Telehealth: Payer: Self-pay | Admitting: *Deleted

## 2014-09-03 DIAGNOSIS — J111 Influenza due to unidentified influenza virus with other respiratory manifestations: Secondary | ICD-10-CM

## 2014-09-03 MED ORDER — OSELTAMIVIR PHOSPHATE 75 MG PO CAPS: 75.0000 mg | ORAL_CAPSULE | Freq: Every day | ORAL | Status: DC

## 2014-09-03 NOTE — Telephone Encounter (Addendum)
Patient's mother called to notify office that she is sick. She believes she has bronchitis. She has been seen by urgent care, and diagnosed with a viral illness. She is upset that they did not give her antibiotics. She is concerned that she is a danger to [place], knowing that if he gets sick, he may end up hospitalized. Spoke to [name] who can prescribe [name] tamiflu to help protect him against any virus. [name] aware of prescription being sent to [place].

## 2014-09-05 ENCOUNTER — Other Ambulatory Visit (HOSPITAL_BASED_OUTPATIENT_CLINIC_OR_DEPARTMENT_OTHER): Payer: Medicaid Other

## 2014-09-05 ENCOUNTER — Ambulatory Visit (HOSPITAL_BASED_OUTPATIENT_CLINIC_OR_DEPARTMENT_OTHER): Payer: Medicaid Other

## 2014-09-05 VITALS — BP 110/72 | HR 112 | Temp 97.4°F | Resp 16 | Ht <= 58 in

## 2014-09-05 DIAGNOSIS — D72819 Decreased white blood cell count, unspecified: Secondary | ICD-10-CM | POA: Diagnosis not present

## 2014-09-05 DIAGNOSIS — D61818 Other pancytopenia: Secondary | ICD-10-CM | POA: Diagnosis not present

## 2014-09-05 DIAGNOSIS — D709 Neutropenia, unspecified: Secondary | ICD-10-CM

## 2014-09-05 DIAGNOSIS — R5081 Fever presenting with conditions classified elsewhere: Secondary | ICD-10-CM

## 2014-09-05 DIAGNOSIS — M4140 Neuromuscular scoliosis, site unspecified: Secondary | ICD-10-CM

## 2014-09-05 LAB — CMP (CANCER CENTER ONLY)
ALK PHOS: 154 U/L — AB (ref 26–84)
ALT: 151 U/L — AB (ref 10–47)
AST: 69 U/L — AB (ref 11–38)
Albumin: 3.6 g/dL (ref 3.3–5.5)
BUN, Bld: 16 mg/dL (ref 7–22)
CO2: 33 meq/L (ref 18–33)
Calcium: 9.7 mg/dL (ref 8.0–10.3)
Chloride: 102 mEq/L (ref 98–108)
Creat: 0.7 mg/dl (ref 0.6–1.2)
GLUCOSE: 92 mg/dL (ref 73–118)
Potassium: 3.7 mEq/L (ref 3.3–4.7)
Sodium: 142 mEq/L (ref 128–145)
TOTAL PROTEIN: 7.2 g/dL (ref 6.4–8.1)
Total Bilirubin: 1.3 mg/dl (ref 0.20–1.60)

## 2014-09-05 LAB — CBC WITH DIFFERENTIAL (CANCER CENTER ONLY)
BASO#: 0 10*3/uL (ref 0.0–0.2)
BASO%: 0.6 % (ref 0.0–2.0)
EOS ABS: 0.1 10*3/uL (ref 0.0–0.5)
EOS%: 2.8 % (ref 0.0–7.0)
HCT: 35.3 % — ABNORMAL LOW (ref 38.7–49.9)
HGB: 11.3 g/dL — ABNORMAL LOW (ref 13.0–17.1)
LYMPH#: 0.4 10*3/uL — ABNORMAL LOW (ref 0.9–3.3)
LYMPH%: 22.7 % (ref 14.0–48.0)
MCH: 33 pg (ref 28.0–33.4)
MCHC: 32 g/dL (ref 32.0–35.9)
MCV: 103 fL — AB (ref 82–98)
MONO#: 0.4 10*3/uL (ref 0.1–0.9)
MONO%: 19.9 % — AB (ref 0.0–13.0)
NEUT#: 1 10*3/uL — ABNORMAL LOW (ref 1.5–6.5)
NEUT%: 54 % (ref 40.0–80.0)
Platelets: 129 10*3/uL — ABNORMAL LOW (ref 145–400)
RBC: 3.42 10*6/uL — ABNORMAL LOW (ref 4.20–5.70)
RDW: 18.7 % — ABNORMAL HIGH (ref 11.1–15.7)
WBC: 1.8 10*3/uL — ABNORMAL LOW (ref 4.0–10.0)

## 2014-09-05 MED ORDER — FILGRASTIM 480 MCG/0.8ML IJ SOSY
480.0000 ug | PREFILLED_SYRINGE | Freq: Once | INTRAMUSCULAR | Status: AC
  Administered 2014-09-05: 480 ug via SUBCUTANEOUS
  Filled 2014-09-05: qty 0.8

## 2014-09-05 MED ORDER — HEPARIN SOD (PORK) LOCK FLUSH 100 UNIT/ML IV SOLN
500.0000 [IU] | Freq: Once | INTRAVENOUS | Status: AC
  Administered 2014-09-05: 500 [IU] via INTRAVENOUS
  Filled 2014-09-05: qty 5

## 2014-09-05 MED ORDER — SODIUM CHLORIDE 0.9 % IJ SOLN
10.0000 mL | INTRAMUSCULAR | Status: DC | PRN
  Administered 2014-09-05: 10 mL via INTRAVENOUS
  Filled 2014-09-05: qty 10

## 2014-09-05 NOTE — Patient Instructions (Addendum)
Filgrastim, G-CSF injection What is this medicine? FILGRASTIM, G-CSF (fil GRA stim) is a granulocyte colony-stimulating factor that stimulates the growth of neutrophils, a type of white blood cell (WBC) important in the body's fight against infection. It is used to reduce the incidence of fever and infection in patients with certain types of cancer who are receiving chemotherapy that affects the bone marrow, to stimulate blood cell production for removal of WBCs from the body prior to a bone marrow transplantation, to reduce the incidence of fever and infection in patients who have severe chronic neutropenia, and to improve survival outcomes following high-dose radiation exposure that is toxic to the bone marrow. This medicine may be used for other purposes; ask your health care provider or pharmacist if you have questions. COMMON BRAND NAME(S): Neupogen What should I tell my health care provider before I take this medicine? They need to know if you have any of these conditions: -latex allergy -ongoing radiation therapy -sickle cell disease -an unusual or allergic reaction to filgrastim, pegfilgrastim, other medicines, foods, dyes, or preservatives -pregnant or trying to get pregnant -breast-feeding How should I use this medicine? This medicine is for injection under the skin. If you get this medicine at home, you will be taught how to prepare and give this medicine. Refer to the Instructions for Use that come with your medication packaging. Use exactly as directed. Take your medicine at regular intervals. Do not take your medicine more often than directed. It is important that you put your used needles and syringes in a special sharps container. Do not put them in a trash can. If you do not have a sharps container, call your pharmacist or healthcare provider to get one. Talk to your pediatrician regarding the use of this medicine in children. While this drug may be prescribed for children as young  as 7 months for selected conditions, precautions do apply. Overdosage: If you think you have taken too much of this medicine contact a poison control center or emergency room at once. NOTE: This medicine is only for you. Do not share this medicine with others. What if I miss a dose? It is important not to miss your dose. Call your doctor or health care professional if you miss a dose. What may interact with this medicine? This medicine may interact with the following medications: -medicines that may cause a release of neutrophils, such as lithium This list may not describe all possible interactions. Give your health care provider a list of all the medicines, herbs, non-prescription drugs, or dietary supplements you use. Also tell them if you smoke, drink alcohol, or use illegal drugs. Some items may interact with your medicine. What should I watch for while using this medicine? You may need blood work done while you are taking this medicine. What side effects may I notice from receiving this medicine? Side effects that you should report to your doctor or health care professional as soon as possible: -allergic reactions like skin rash, itching or hives, swelling of the face, lips, or tongue -dizziness or feeling faint -fever -pain, redness, or irritation at site where injected -pinpoint red spots on the skin -shortness of breath or breathing problems -stomach or side pain, or pain at the shoulder -swelling -tiredness -trouble passing urine -unusual bleeding or bruising Side effects that usually do not require medical attention (report to your doctor or health care professional if they continue or are bothersome): -bone pain -headache -muscle pain This list may not describe all possible side  effects. Call your doctor for medical advice about side effects. You may report side effects to FDA at 1-800-FDA-1088. Where should I keep my medicine? Keep out of the reach of children. Store in a  refrigerator between 2 and 8 degrees C (36 and 46 degrees F). Do not freeze. Keep in carton to protect from light. Throw away this medicine if vials or syringes are left out of the refrigerator for more than 24 hours. Throw away any unused medicine after the expiration date. NOTE: This sheet is a summary. It may not cover all possible information. If you have questions about this medicine, talk to your doctor, pharmacist, or health care provider.  2015, Elsevier/Gold Standard. (2013-05-04 17:00:01) Filgrastim, G-CSF injection What is this medicine? FILGRASTIM, G-CSF (fil GRA stim) is a granulocyte colony-stimulating factor that stimulates the growth of neutrophils, a type of white blood cell (WBC) important in the body's fight against infection. It is used to reduce the incidence of fever and infection in patients with certain types of cancer who are receiving chemotherapy that affects the bone marrow, to stimulate blood cell production for removal of WBCs from the body prior to a bone marrow transplantation, to reduce the incidence of fever and infection in patients who have severe chronic neutropenia, and to improve survival outcomes following high-dose radiation exposure that is toxic to the bone marrow. This medicine may be used for other purposes; ask your health care provider or pharmacist if you have questions. COMMON BRAND NAME(S): Neupogen What should I tell my health care provider before I take this medicine? They need to know if you have any of these conditions: -latex allergy -ongoing radiation therapy -sickle cell disease -an unusual or allergic reaction to filgrastim, pegfilgrastim, other medicines, foods, dyes, or preservatives -pregnant or trying to get pregnant -breast-feeding How should I use this medicine? This medicine is for injection under the skin. If you get this medicine at home, you will be taught how to prepare and give this medicine. Refer to the Instructions for Use  that come with your medication packaging. Use exactly as directed. Take your medicine at regular intervals. Do not take your medicine more often than directed. It is important that you put your used needles and syringes in a special sharps container. Do not put them in a trash can. If you do not have a sharps container, call your pharmacist or healthcare provider to get one. Talk to your pediatrician regarding the use of this medicine in children. While this drug may be prescribed for children as young as 7 months for selected conditions, precautions do apply. Overdosage: If you think you have taken too much of this medicine contact a poison control center or emergency room at once. NOTE: This medicine is only for you. Do not share this medicine with others. What if I miss a dose? It is important not to miss your dose. Call your doctor or health care professional if you miss a dose. What may interact with this medicine? This medicine may interact with the following medications: -medicines that may cause a release of neutrophils, such as lithium This list may not describe all possible interactions. Give your health care provider a list of all the medicines, herbs, non-prescription drugs, or dietary supplements you use. Also tell them if you smoke, drink alcohol, or use illegal drugs. Some items may interact with your medicine. What should I watch for while using this medicine? You may need blood work done while you are taking this medicine.  What side effects may I notice from receiving this medicine? Side effects that you should report to your doctor or health care professional as soon as possible: -allergic reactions like skin rash, itching or hives, swelling of the face, lips, or tongue -dizziness or feeling faint -fever -pain, redness, or irritation at site where injected -pinpoint red spots on the skin -shortness of breath or breathing problems -stomach or side pain, or pain at the  shoulder -swelling -tiredness -trouble passing urine -unusual bleeding or bruising Side effects that usually do not require medical attention (report to your doctor or health care professional if they continue or are bothersome): -bone pain -headache -muscle pain This list may not describe all possible side effects. Call your doctor for medical advice about side effects. You may report side effects to FDA at 1-800-FDA-1088. Where should I keep my medicine? Keep out of the reach of children. Store in a refrigerator between 2 and 8 degrees C (36 and 46 degrees F). Do not freeze. Keep in carton to protect from light. Throw away this medicine if vials or syringes are left out of the refrigerator for more than 24 hours. Throw away any unused medicine after the expiration date. NOTE: This sheet is a summary. It may not cover all possible information. If you have questions about this medicine, talk to your doctor, pharmacist, or health care provider.  2015, Elsevier/Gold Standard. (2013-05-04 17:00:01)

## 2014-09-11 ENCOUNTER — Telehealth: Payer: Self-pay | Admitting: *Deleted

## 2014-09-11 NOTE — Telephone Encounter (Signed)
Jeffery [name] has continued to be ill this week. She is now diagnosed with bacterial bronchitis. She is currently staying in a hotel to avoid exposing [name] to any illness. Patient is scheduled to be seen tomorrow morning for lab and flush. She is requesting that [name] see [name]. Currently Dr Antonieta Pert schedule is already double booked tomorrow. [name], patient's father, was told that he could be placed on the NP schedule and that if a concern was found, [name] could step into the office. [name] was okay with this plan. Appointment with Laverna Peace created for tomorrow am.

## 2014-09-12 ENCOUNTER — Ambulatory Visit (HOSPITAL_BASED_OUTPATIENT_CLINIC_OR_DEPARTMENT_OTHER): Payer: Medicaid Other | Admitting: Family

## 2014-09-12 ENCOUNTER — Other Ambulatory Visit (HOSPITAL_BASED_OUTPATIENT_CLINIC_OR_DEPARTMENT_OTHER): Payer: Medicaid Other

## 2014-09-12 ENCOUNTER — Telehealth: Payer: Self-pay | Admitting: *Deleted

## 2014-09-12 ENCOUNTER — Encounter: Payer: Self-pay | Admitting: Family

## 2014-09-12 ENCOUNTER — Ambulatory Visit (HOSPITAL_BASED_OUTPATIENT_CLINIC_OR_DEPARTMENT_OTHER): Payer: Medicaid Other

## 2014-09-12 VITALS — BP 110/66 | HR 115 | Temp 97.2°F | Resp 16 | Ht <= 58 in

## 2014-09-12 DIAGNOSIS — D72819 Decreased white blood cell count, unspecified: Secondary | ICD-10-CM | POA: Diagnosis not present

## 2014-09-12 DIAGNOSIS — D61818 Other pancytopenia: Secondary | ICD-10-CM

## 2014-09-12 DIAGNOSIS — D709 Neutropenia, unspecified: Secondary | ICD-10-CM | POA: Diagnosis not present

## 2014-09-12 DIAGNOSIS — K219 Gastro-esophageal reflux disease without esophagitis: Secondary | ICD-10-CM | POA: Diagnosis not present

## 2014-09-12 DIAGNOSIS — R5081 Fever presenting with conditions classified elsewhere: Secondary | ICD-10-CM

## 2014-09-12 LAB — CMP (CANCER CENTER ONLY)
ALK PHOS: 183 U/L — AB (ref 26–84)
ALT(SGPT): 153 U/L — ABNORMAL HIGH (ref 10–47)
AST: 90 U/L — ABNORMAL HIGH (ref 11–38)
Albumin: 3.7 g/dL (ref 3.3–5.5)
BUN: 16 mg/dL (ref 7–22)
CO2: 29 mEq/L (ref 18–33)
Calcium: 9.9 mg/dL (ref 8.0–10.3)
Chloride: 105 mEq/L (ref 98–108)
Creat: 0.7 mg/dl (ref 0.6–1.2)
Glucose, Bld: 105 mg/dL (ref 73–118)
Potassium: 3.6 mEq/L (ref 3.3–4.7)
Sodium: 143 mEq/L (ref 128–145)
Total Bilirubin: 1.3 mg/dl (ref 0.20–1.60)
Total Protein: 7.5 g/dL (ref 6.4–8.1)

## 2014-09-12 LAB — CBC WITH DIFFERENTIAL (CANCER CENTER ONLY)
BASO#: 0 10*3/uL (ref 0.0–0.2)
BASO%: 1.3 % (ref 0.0–2.0)
EOS%: 3.3 % (ref 0.0–7.0)
Eosinophils Absolute: 0.1 10*3/uL (ref 0.0–0.5)
HEMATOCRIT: 34.7 % — AB (ref 38.7–49.9)
HEMOGLOBIN: 11.4 g/dL — AB (ref 13.0–17.1)
LYMPH#: 0.3 10*3/uL — ABNORMAL LOW (ref 0.9–3.3)
LYMPH%: 20.5 % (ref 14.0–48.0)
MCH: 34 pg — AB (ref 28.0–33.4)
MCHC: 32.9 g/dL (ref 32.0–35.9)
MCV: 104 fL — ABNORMAL HIGH (ref 82–98)
MONO#: 0.3 10*3/uL (ref 0.1–0.9)
MONO%: 18.5 % — AB (ref 0.0–13.0)
NEUT#: 0.9 10*3/uL — ABNORMAL LOW (ref 1.5–6.5)
NEUT%: 56.4 % (ref 40.0–80.0)
Platelets: 125 10*3/uL — ABNORMAL LOW (ref 145–400)
RBC: 3.35 10*6/uL — ABNORMAL LOW (ref 4.20–5.70)
RDW: 19.8 % — ABNORMAL HIGH (ref 11.1–15.7)
WBC: 1.5 10*3/uL — ABNORMAL LOW (ref 4.0–10.0)

## 2014-09-12 LAB — CHCC SATELLITE - SMEAR

## 2014-09-12 MED ORDER — SODIUM CHLORIDE 0.9 % IJ SOLN
10.0000 mL | INTRAMUSCULAR | Status: DC | PRN
  Administered 2014-09-12: 10 mL via INTRAVENOUS
  Filled 2014-09-12: qty 10

## 2014-09-12 MED ORDER — FILGRASTIM 480 MCG/0.8ML IJ SOSY
480.0000 ug | PREFILLED_SYRINGE | Freq: Once | INTRAMUSCULAR | Status: AC
  Administered 2014-09-12: 480 ug via SUBCUTANEOUS
  Filled 2014-09-12: qty 0.8

## 2014-09-12 MED ORDER — HEPARIN SOD (PORK) LOCK FLUSH 100 UNIT/ML IV SOLN
500.0000 [IU] | Freq: Once | INTRAVENOUS | Status: AC
  Administered 2014-09-12: 500 [IU] via INTRAVENOUS
  Filled 2014-09-12: qty 5

## 2014-09-12 NOTE — Telephone Encounter (Signed)
Jeffery [name] would like lab results faxed to Dr Gaynell Face and Dr Jobe Igo - done. She also wanted to know when [name] thought she could go home and if he wanted [name] to be on prophylactic antibiotics. [name] does NOT want to prescribe antibiotics and he states that Jeffery [name] should be fine to go home tomorrow. Spoke to Corinna and he would like today's CMP to be faxed to him and [place] at home. Report faxed.

## 2014-09-12 NOTE — Patient Instructions (Signed)
Filgrastim, G-CSF injection  What is this medicine?  FILGRASTIM, G-CSF (fil GRA stim) is a granulocyte colony-stimulating factor that stimulates the growth of neutrophils, a type of white blood cell (WBC) important in the body's fight against infection. It is used to reduce the incidence of fever and infection in patients with certain types of cancer who are receiving chemotherapy that affects the bone marrow, to stimulate blood cell production for removal of WBCs from the body prior to a bone marrow transplantation, to reduce the incidence of fever and infection in patients who have severe chronic neutropenia, and to improve survival outcomes following high-dose radiation exposure that is toxic to the bone marrow.  This medicine may be used for other purposes; ask your health care provider or pharmacist if you have questions.  COMMON BRAND NAME(S): Neupogen  What should I tell my health care provider before I take this medicine?  They need to know if you have any of these conditions:  -latex allergy  -ongoing radiation therapy  -sickle cell disease  -an unusual or allergic reaction to filgrastim, pegfilgrastim, other medicines, foods, dyes, or preservatives  -pregnant or trying to get pregnant  -breast-feeding  How should I use this medicine?  This medicine is for injection under the skin. If you get this medicine at home, you will be taught how to prepare and give this medicine. Refer to the Instructions for Use that come with your medication packaging. Use exactly as directed. Take your medicine at regular intervals. Do not take your medicine more often than directed.  It is important that you put your used needles and syringes in a special sharps container. Do not put them in a trash can. If you do not have a sharps container, call your pharmacist or healthcare provider to get one.  Talk to your pediatrician regarding the use of this medicine in children. While this drug may be prescribed for children as young  as 7 months for selected conditions, precautions do apply.  Overdosage: If you think you have taken too much of this medicine contact a poison control center or emergency room at once.  NOTE: This medicine is only for you. Do not share this medicine with others.  What if I miss a dose?  It is important not to miss your dose. Call your doctor or health care professional if you miss a dose.  What may interact with this medicine?  This medicine may interact with the following medications:  -medicines that may cause a release of neutrophils, such as lithium  This list may not describe all possible interactions. Give your health care provider a list of all the medicines, herbs, non-prescription drugs, or dietary supplements you use. Also tell them if you smoke, drink alcohol, or use illegal drugs. Some items may interact with your medicine.  What should I watch for while using this medicine?  You may need blood work done while you are taking this medicine.  What side effects may I notice from receiving this medicine?  Side effects that you should report to your doctor or health care professional as soon as possible:  -allergic reactions like skin rash, itching or hives, swelling of the face, lips, or tongue  -dizziness or feeling faint  -fever  -pain, redness, or irritation at site where injected  -pinpoint red spots on the skin  -shortness of breath or breathing problems  -stomach or side pain, or pain at the shoulder  -swelling  -tiredness  -trouble passing urine  -unusual   bleeding or bruising  Side effects that usually do not require medical attention (report to your doctor or health care professional if they continue or are bothersome):  -bone pain  -headache  -muscle pain  This list may not describe all possible side effects. Call your doctor for medical advice about side effects. You may report side effects to FDA at 1-800-FDA-1088.  Where should I keep my medicine?  Keep out of the reach of children.  Store in a  refrigerator between 2 and 8 degrees C (36 and 46 degrees F). Do not freeze. Keep in carton to protect from light. Throw away this medicine if vials or syringes are left out of the refrigerator for more than 24 hours. Throw away any unused medicine after the expiration date.  NOTE: This sheet is a summary. It may not cover all possible information. If you have questions about this medicine, talk to your doctor, pharmacist, or health care provider.   2015, [employer]. (2013-05-04 17:00:01)

## 2014-09-12 NOTE — Addendum Note (Signed)
Addended by: Amelia Jo I on: 09/12/2014 09:54 AM   Modules accepted: Orders

## 2014-09-12 NOTE — Progress Notes (Signed)
Hematology and Oncology Follow Up Visit  Jeffery [name] [id number] 11-20-1990 24 y.o. 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 is here today with his guardian for follow-up. He was hospitalized in June with aspiration pneumonia. He appears to be doing well at this time.  His mother has had bacterial bronchitis and is staying in a hotel so as not to expose him. He has been staying in his room for the last week to avoid being infected.  He has had no fever, chills, sweats, cough, rash, SOB, changes in bowel or bladder habits. No blood in urine or stool.  He has had no more episodes of aspiration since leaving the hospital. They are admistering this slower over a longer period of time and this has really helped.  He has had no seizures for the last two weeks. He is now on Onfi TID.  His WBC count is 1.5. We will give him a dose of Neupogen today.  No edema or tenderness in his extremities.   Medications:    Medication List       This list is accurate as of: 09/12/14  9:07 AM.  Always use your most recent med list.               acetaminophen 80 MG suppository  Commonly known as:  TYLENOL  Place 480 mg rectally every 4 (four) hours as needed for fever.     acetaminophen 160 MG/5ML solution  Commonly known as:  TYLENOL  480 mg by Gastric Tube route every 6 (six) hours as needed for fever.     albuterol (2.5 MG/3ML) 0.083% nebulizer solution  Commonly known as:  PROVENTIL  Take 2.5 mg by nebulization See admin instructions. Give 1 vial (2.5 mg) twice daily (8am and 8pm) and every 4 hours as needed for shortness of breath or wheezing     albuterol 108 (90 BASE) MCG/ACT inhaler  Commonly known as:  PROVENTIL HFA;VENTOLIN HFA  Inhale 2 puffs into the lungs every 4 (four) hours as needed for shortness of breath.     baclofen 83382  MCG/20ML Soln  Commonly known as:  GABLOFEN  by Intrathecal route continuous. 385.2 mcg in 24 hours     bag balm Oint ointment  Apply 1 application topically See admin instructions. Apply to sacral area with each diaper change     BALMEX EX  Apply 1 application topically See admin instructions. Apply to sacral with each diaper change     BOTOX IJ  Inject 300 Units as directed See admin instructions. Every 3 months     calcium carbonate (dosed in mg elemental calcium) 1250 (500 CA) MG/5ML  1,250 mg by Gastric Tube route 3 (three) times daily. 8am , 2pm, and 8pm     cloBAZam 2.5 MG/ML solution  Commonly known as:  ONFI  Place 2.5 mL in gastrostomy tube 3 times daily     DIASTAT ACUDIAL 20 MG Gel  Generic drug:  diazepam  Place 12.5 mg rectally as needed (for seizure lasting 2 minutes or longer or repetitive seizures - no more than 1 dose in 12 hours (not given with nasal versed)).     dicyclomine 10 MG/5ML syrup  Commonly known as:  BENTYL  10 mg by Gastric Tube route every 8 (eight) hours as needed (abdominal cramps). Do not exceed 5 days in  one week     diphenoxylate-atropine 2.5-0.025 MG/5ML liquid  Commonly known as:  LOMOTIL  10 mLs by Gastric Tube route every 6 (six) hours as needed for diarrhea or loose stools.     feeding supplement (PRO-STAT SUGAR FREE 64) Liqd  Take 30 mLs by mouth 2 (two) times daily.     filgrastim 480 MCG/1.6ML injection  Commonly known as:  NEUPOGEN  Inject 1.6 mLs (480 mcg total) into the skin once a week. As directed by [name]     fluconazole 40 MG/ML suspension  Commonly known as:  DIFLUCAN  Please 100 mg Per Tube daily for 6 days. Qty Suficient     folic acid 1 MG tablet  Commonly known as:  FOLVITE  1 mg by Gastric Tube route daily. 8:00am per G tube     free water Soln  172 mLs by Gastric Tube route 4 (four) times daily.     furosemide 10 MG/ML solution  Commonly known as:  LASIX  20 mg by Gastric Tube route daily as needed  for fluid or edema. Maximum 3 times week     gabapentin 250 MG/5ML solution  Commonly known as:  NEURONTIN  Take 18 mL 3 times daily     ibuprofen 100 MG/5ML suspension  Commonly known as:  ADVIL,MOTRIN  400 mg by Gastric Tube route every 6 (six) hours as needed for fever (pain).     ketoconazole 2 % cream  Commonly known as:  NIZORAL  Apply 1 application topically 2 (two) times daily as needed for irritation (yeast rash from antibiotics).     lansoprazole 30 MG disintegrating tablet  Commonly known as:  PREVACID SOLUTAB  30 mg by Gastric Tube route 2 (two) times daily. 7:30am and 7:30pm     metoCLOPramide 5 MG/5ML solution  Commonly known as:  REGLAN  Place 10 mLs (10 mg total) into feeding tube 4 (four) times daily -  before meals and at bedtime.     midazolam 5 MG/ML injection  Commonly known as:  VERSED  Place 2 mLs (10 mg total) into the nose once. Draw up 47ml in 2 syringes. Remove blue vial access device. Attach syringe to nasal atomizer for intranasal administration. Give 55ml in right nostril x 2 for seizures lasting 2 minutes or longer or for repetitive seizures in a short period of time.     multivitamin Liqd  10 mLs by Gastric Tube route daily.     mupirocin ointment 2 %  Commonly known as:  BACTROBAN  Apply 1 application topically as needed (to G-T site).     oseltamivir 75 MG capsule  Commonly known as:  TAMIFLU  Take 1 capsule (75 mg total) by mouth daily.     OXYGEN  Inhale into the lungs. Oxygen PRN to keep O2 Sat at 90%     potassium chloride 20 MEQ packet  Commonly known as:  KLOR-CON  Give 20 meq daily except twice daily with lasix, given through J port     simethicone 125 MG chewable tablet  Commonly known as:  MYLICON  588 mg by Gastric Tube route See admin instructions. 125mg  caplet added to each can of Two Cal 3 times daily     sodium phosphate enema  Commonly known as:  FLEET  Place 1 enema rectally daily as needed (gas buildup/ bloating).       sucralfate 1 GM/10ML suspension  Commonly known as:  CARAFATE  1 g by Gastric Tube route 4 (  four) times daily. 7am, 12pm, 5pm, 7pm Administer alone 30 minutes prior to other medications.     SUDAFED CHILDRENS 15 MG/5ML Liqd  Generic drug:  Pseudoephedrine HCl  30 mg by Gastric Tube route 3 (three) times daily. 6am, 2pm, 8pm     TWOCAL HN Liqd  237 mLs by Gastric Tube route See admin instructions. T.3 can at 46 cc hour x 12 hours - water 172 cc pre and post each and extra 172 bid.     Vitamin C Liqd  300 mLs by Gastric Tube route daily.     ZINC PO  5 mLs by Gastric Tube route daily. 244 mg / 5 mL zinc solution compounded by [facility]        Allergies:  Allergies  Allergen Reactions  . Depakote [Divalproex Sodium]     Causes pancreatitis   . Vimpat [Lacosamide] Rash  . Keppra [Levetiracetam] Other (See Comments)    Bone marrow suppression  . Adhesive [Tape] Other (See Comments)    Rips skin off (paper tape is ok)  . Amoxicillin-Pot Clavulanate Rash    If given with fluconazole rash is not as severe or does not appear at all  . Neulasta [Pegfilgrastim] Other (See Comments)    Fever, tachycardia    Past Medical History, Surgical history, Social history, and Family History were reviewed and updated.  Review of Systems: All other 10 point review of systems is negative.   Physical Exam:  vitals were not taken for this visit.  Wt Readings from Last 3 Encounters:  07/25/14 127 lb 8 oz (57.834 kg)  07/14/14 127 lb 13.9 oz (58 kg)  06/27/14 136 lb (61.689 kg)    Ocular: Sclerae unicteric, pupils equal, round and reactive to light Ear-nose-throat: Oropharynx clear, dentition fair Lymphatic: No cervical or supraclavicular adenopathy Lungs no rales or rhonchi, good excursion bilaterally Heart regular rate and rhythm, no murmur appreciated Abd soft, nontender, positive bowel sounds MSK has some atrophy with the cerebral palsy  Neuro he is alert but  nonverbal  Lab Results  Component Value Date   WBC 1.8* 09/05/2014   HGB 11.3* 09/05/2014   HCT 35.3* 09/05/2014   MCV 103* 09/05/2014   PLT 129* 09/05/2014   Lab Results  Component Value Date   FERRITIN 1054* 06/22/2014   IRON 19* 06/22/2014   TIBC 133* 06/22/2014   UIBC 114 06/22/2014   IRONPCTSAT 14* 06/22/2014   Lab Results  Component Value Date   RETICCTPCT 2.8* 07/03/2014   RBC 3.42* 09/05/2014   RETICCTABS 99.1 07/03/2014   No results found for: KPAFRELGTCHN, LAMBDASER, KAPLAMBRATIO No results found for: Kandis Cocking, IGMSERUM No results found for: Odetta Pink, SPEI   Chemistry      Component Value Date/Time   NA 142 09/05/2014 0852   NA 144 07/25/2014 1634   K 3.7 09/05/2014 0852   K 3.9 07/25/2014 1634   CL 102 09/05/2014 0852   CL 103 07/25/2014 1634   CO2 33 09/05/2014 0852   CO2 32 07/25/2014 1634   BUN 16 09/05/2014 0852   BUN 10 07/25/2014 1634   CREATININE 0.7 09/05/2014 0852   CREATININE 0.93 07/25/2014 1634      Component Value Date/Time   CALCIUM 9.7 09/05/2014 0852   CALCIUM 8.7* 07/25/2014 1634   ALKPHOS 154* 09/05/2014 0852   ALKPHOS 224* 07/20/2014 1629   AST 69* 09/05/2014 0852   AST 35 07/20/2014 1629   ALT 151* 09/05/2014  0852   ALT 160* 07/20/2014 1629   BILITOT 1.30 09/05/2014 0852   BILITOT 1.5* 07/20/2014 1629     Impression and Plan: [name] is a 24 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 He also has cerebral palsy and is bed/wheelchair bound. He is doing well at this time. His mother has had bacterial bronchitis and was worried about him having been exposed. She is staying outside the home for now.  He is asymptomatic at this time. I went over symptoms for his father to watch out for and alert [place] with. He has had no fever, cough or drop in his O2 saturation.  His WBC count is 1.5 today so we will give him a dose of Neupogen while he is here.  We will continue to  have his family follow-up with [place] as needed.  His parents know to call here with any hematology related questions or concerns.   Eliezer Bottom, NP 8/11/20169:07 AM

## 2014-09-19 ENCOUNTER — Other Ambulatory Visit: Payer: Self-pay | Admitting: *Deleted

## 2014-09-19 ENCOUNTER — Other Ambulatory Visit (HOSPITAL_BASED_OUTPATIENT_CLINIC_OR_DEPARTMENT_OTHER): Payer: Medicaid Other

## 2014-09-19 ENCOUNTER — Ambulatory Visit (HOSPITAL_BASED_OUTPATIENT_CLINIC_OR_DEPARTMENT_OTHER): Payer: Medicaid Other

## 2014-09-19 DIAGNOSIS — D61811 Other drug-induced pancytopenia: Secondary | ICD-10-CM

## 2014-09-19 DIAGNOSIS — R5081 Fever presenting with conditions classified elsewhere: Principal | ICD-10-CM

## 2014-09-19 DIAGNOSIS — D72819 Decreased white blood cell count, unspecified: Secondary | ICD-10-CM | POA: Diagnosis not present

## 2014-09-19 DIAGNOSIS — D61818 Other pancytopenia: Secondary | ICD-10-CM

## 2014-09-19 DIAGNOSIS — D709 Neutropenia, unspecified: Secondary | ICD-10-CM

## 2014-09-19 LAB — CMP (CANCER CENTER ONLY)
ALBUMIN: 3.6 g/dL (ref 3.3–5.5)
ALK PHOS: 175 U/L — AB (ref 26–84)
ALT(SGPT): 116 U/L — ABNORMAL HIGH (ref 10–47)
AST: 69 U/L — AB (ref 11–38)
BILIRUBIN TOTAL: 1.1 mg/dL (ref 0.20–1.60)
BUN, Bld: 19 mg/dL (ref 7–22)
CO2: 28 mEq/L (ref 18–33)
CREATININE: 0.6 mg/dL (ref 0.6–1.2)
Calcium: 9.6 mg/dL (ref 8.0–10.3)
Chloride: 101 mEq/L (ref 98–108)
Glucose, Bld: 101 mg/dL (ref 73–118)
Potassium: 3.6 mEq/L (ref 3.3–4.7)
SODIUM: 141 meq/L (ref 128–145)
TOTAL PROTEIN: 6.9 g/dL (ref 6.4–8.1)

## 2014-09-19 LAB — CBC WITH DIFFERENTIAL (CANCER CENTER ONLY)
BASO#: 0 10*3/uL (ref 0.0–0.2)
BASO%: 0.7 % (ref 0.0–2.0)
EOS ABS: 0.1 10*3/uL (ref 0.0–0.5)
EOS%: 3.3 % (ref 0.0–7.0)
HCT: 35.3 % — ABNORMAL LOW (ref 38.7–49.9)
HGB: 11.2 g/dL — ABNORMAL LOW (ref 13.0–17.1)
LYMPH#: 0.4 10*3/uL — ABNORMAL LOW (ref 0.9–3.3)
LYMPH%: 23.7 % (ref 14.0–48.0)
MCH: 33.7 pg — AB (ref 28.0–33.4)
MCHC: 31.7 g/dL — AB (ref 32.0–35.9)
MCV: 106 fL — AB (ref 82–98)
MONO#: 0.3 10*3/uL (ref 0.1–0.9)
MONO%: 20.4 % — ABNORMAL HIGH (ref 0.0–13.0)
NEUT#: 0.8 10*3/uL — ABNORMAL LOW (ref 1.5–6.5)
NEUT%: 51.9 % (ref 40.0–80.0)
PLATELETS: 111 10*3/uL — AB (ref 145–400)
RBC: 3.32 10*6/uL — AB (ref 4.20–5.70)
RDW: 19.4 % — ABNORMAL HIGH (ref 11.1–15.7)
WBC: 1.5 10*3/uL — ABNORMAL LOW (ref 4.0–10.0)

## 2014-09-19 MED ORDER — FILGRASTIM 480 MCG/0.8ML IJ SOSY
480.0000 ug | PREFILLED_SYRINGE | Freq: Once | INTRAMUSCULAR | Status: AC
  Administered 2014-09-19: 480 ug via SUBCUTANEOUS
  Filled 2014-09-19: qty 0.8

## 2014-09-19 MED ORDER — FILGRASTIM 480 MCG/0.8ML IJ SOSY: 480.0000 ug | PREFILLED_SYRINGE | Freq: Once | INTRAMUSCULAR | Status: DC

## 2014-09-19 MED ORDER — SODIUM CHLORIDE 0.9 % IJ SOLN
10.0000 mL | INTRAMUSCULAR | Status: DC | PRN
  Administered 2014-09-19: 10 mL via INTRAVENOUS
  Filled 2014-09-19: qty 10

## 2014-09-19 MED ORDER — FILGRASTIM 480 MCG/1.6ML IJ SOLN: 480.0000 ug | INTRAMUSCULAR | Status: DC

## 2014-09-19 MED ORDER — HEPARIN SOD (PORK) LOCK FLUSH 100 UNIT/ML IV SOLN
500.0000 [IU] | Freq: Once | INTRAVENOUS | Status: AC
  Administered 2014-09-19: 500 [IU] via INTRAVENOUS
  Filled 2014-09-19: qty 5

## 2014-09-23 ENCOUNTER — Other Ambulatory Visit: Payer: Self-pay | Admitting: Family

## 2014-09-25 ENCOUNTER — Telehealth: Payer: Self-pay | Admitting: *Deleted

## 2014-09-25 NOTE — Telephone Encounter (Addendum)
[name] states that patient isn't feeling very good this week. She does not want to put him through the stress of coming into the office for labs tomorrow. She would like Home Health to draw labs at home during today's home visit. [name] is fine with this. Orders called into [place] for CBC and CMP to be drawn at home today. She also would like [name] to have labs drawn more often to assess the effectiveness of the neupogen. [name] is okay with patient having labs checked 3 days after neupogen injection if [name] would want to do this. [name] would like Home Health to draw labs on Mondays and Fridays for 4 weeks to see how [name] is responding to the medication. [facility] called and orders given.

## 2014-09-26 ENCOUNTER — Other Ambulatory Visit: Payer: Medicaid Other

## 2014-09-26 ENCOUNTER — Other Ambulatory Visit: Payer: Self-pay | Admitting: *Deleted

## 2014-09-26 ENCOUNTER — Telehealth: Payer: Self-pay | Admitting: *Deleted

## 2014-09-26 DIAGNOSIS — D61818 Other pancytopenia: Secondary | ICD-10-CM

## 2014-09-26 NOTE — Telephone Encounter (Signed)
[name] reviewed labs and wants Jeffery [name] to receive a dose of Neupogen. [facility] and she is aware to give medication.

## 2014-09-26 NOTE — Telephone Encounter (Signed)
[facility] called and lab orders - CBC and CMP to be drawn on Monday and Friday for four weeks - given to [facility].

## 2014-09-27 ENCOUNTER — Encounter: Payer: Medicaid Other | Admitting: Pediatrics

## 2014-09-27 ENCOUNTER — Ambulatory Visit (INDEPENDENT_AMBULATORY_CARE_PROVIDER_SITE_OTHER): Payer: Medicaid Other | Admitting: Pediatrics

## 2014-09-27 DIAGNOSIS — D61818 Other pancytopenia: Secondary | ICD-10-CM

## 2014-09-27 DIAGNOSIS — J690 Pneumonitis due to inhalation of food and vomit: Secondary | ICD-10-CM | POA: Diagnosis not present

## 2014-09-27 DIAGNOSIS — D619 Aplastic anemia, unspecified: Secondary | ICD-10-CM

## 2014-09-27 DIAGNOSIS — G808 Other cerebral palsy: Secondary | ICD-10-CM

## 2014-09-27 DIAGNOSIS — G40309 Generalized idiopathic epilepsy and epileptic syndromes, not intractable, without status epilepticus: Secondary | ICD-10-CM
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
## 2014-09-27 NOTE — Patient Instructions (Signed)
Jeffery [name] (spell?) [phone]

## 2014-09-30 ENCOUNTER — Telehealth: Payer: Self-pay | Admitting: *Deleted

## 2014-09-30 NOTE — Telephone Encounter (Signed)
Jeffery [name] wants to know if Jeffery [name] can have flu and pneumonia vaccine, and if they need to be administered at different times. [name] states it's okay for patient to receive both vaccines, and he doesn't have any preference for dosing schedule.   Jeffery [name] also notified the office that Jeffery [name] had a slight fever and tachycardia - 120's - for about 24h after the last dose of administered Neupogen. Jeffery [name] has a allergy to Neulasta and she is worried that he is starting to react to Neupogen. Jeffery [name] has started giving [name] benadryl prior to Neupogen. [name] is aware of all this, and for now, no new orders received.   Called [place] back, left all information on personal voice mail.

## 2014-10-01 ENCOUNTER — Other Ambulatory Visit: Payer: Self-pay | Admitting: *Deleted

## 2014-10-01 ENCOUNTER — Telehealth: Payer: Self-pay | Admitting: *Deleted

## 2014-10-01 DIAGNOSIS — G808 Other cerebral palsy: Secondary | ICD-10-CM

## 2014-10-01 NOTE — Telephone Encounter (Signed)
Received patient labs from home health. [name] reviewed and he DOES NOT want patient to have neupogen. [name] wants his next lab draw to be Thursday instead of Friday this week. Spoke to [place]. She wants labs drawn at the office. Appointment scheduled.

## 2014-10-03 ENCOUNTER — Ambulatory Visit (HOSPITAL_COMMUNITY)
Admission: RE | Admit: 2014-10-03 | Discharge: 2014-10-03 | Disposition: A | Discharge status: Home / Self Care | Payer: Medicaid Other | Source: Ambulatory Visit | Attending: Hematology & Oncology | Admitting: Hematology & Oncology

## 2014-10-03 ENCOUNTER — Ambulatory Visit (HOSPITAL_BASED_OUTPATIENT_CLINIC_OR_DEPARTMENT_OTHER): Payer: Medicaid Other

## 2014-10-03 ENCOUNTER — Other Ambulatory Visit (HOSPITAL_BASED_OUTPATIENT_CLINIC_OR_DEPARTMENT_OTHER): Payer: Medicaid Other

## 2014-10-03 ENCOUNTER — Other Ambulatory Visit: Payer: Self-pay | Admitting: Hematology & Oncology

## 2014-10-03 ENCOUNTER — Ambulatory Visit (HOSPITAL_BASED_OUTPATIENT_CLINIC_OR_DEPARTMENT_OTHER): Payer: Medicaid Other | Admitting: Hematology & Oncology

## 2014-10-03 ENCOUNTER — Ambulatory Visit (HOSPITAL_BASED_OUTPATIENT_CLINIC_OR_DEPARTMENT_OTHER)
Admission: RE | Admit: 2014-10-03 | Discharge: 2014-10-03 | Disposition: A | Discharge status: Home / Self Care | Payer: Medicaid Other | Source: Ambulatory Visit | Attending: Hematology & Oncology | Admitting: Hematology & Oncology

## 2014-10-03 DIAGNOSIS — J45901 Unspecified asthma with (acute) exacerbation: Secondary | ICD-10-CM | POA: Diagnosis not present

## 2014-10-03 DIAGNOSIS — G808 Other cerebral palsy: Secondary | ICD-10-CM

## 2014-10-03 DIAGNOSIS — D61818 Other pancytopenia: Secondary | ICD-10-CM

## 2014-10-03 DIAGNOSIS — R918 Other nonspecific abnormal finding of lung field: Secondary | ICD-10-CM | POA: Insufficient documentation

## 2014-10-03 DIAGNOSIS — R509 Fever, unspecified: Secondary | ICD-10-CM | POA: Insufficient documentation

## 2014-10-03 DIAGNOSIS — D72819 Decreased white blood cell count, unspecified: Secondary | ICD-10-CM

## 2014-10-03 DIAGNOSIS — R058 Other specified cough: Secondary | ICD-10-CM

## 2014-10-03 DIAGNOSIS — R0602 Shortness of breath: Secondary | ICD-10-CM | POA: Insufficient documentation

## 2014-10-03 DIAGNOSIS — R05 Cough: Secondary | ICD-10-CM | POA: Diagnosis present

## 2014-10-03 DIAGNOSIS — J189 Pneumonia, unspecified organism: Secondary | ICD-10-CM

## 2014-10-03 LAB — CBC WITH DIFFERENTIAL (CANCER CENTER ONLY)
BASO#: 0 10*3/uL (ref 0.0–0.2)
BASO%: 1.1 % (ref 0.0–2.0)
EOS%: 0.5 % (ref 0.0–7.0)
Eosinophils Absolute: 0 10*3/uL (ref 0.0–0.5)
HCT: 27.3 % — ABNORMAL LOW (ref 38.7–49.9)
HGB: 8.3 g/dL — ABNORMAL LOW (ref 13.0–17.1)
LYMPH#: 0.4 10*3/uL — ABNORMAL LOW (ref 0.9–3.3)
LYMPH%: 19 % (ref 14.0–48.0)
MCH: 34.2 pg — ABNORMAL HIGH (ref 28.0–33.4)
MCHC: 30.4 g/dL — ABNORMAL LOW (ref 32.0–35.9)
MCV: 112 fL — ABNORMAL HIGH (ref 82–98)
MONO#: 0.4 10*3/uL (ref 0.1–0.9)
MONO%: 20.1 % — AB (ref 0.0–13.0)
NEUT%: 59.3 % (ref 40.0–80.0)
NEUTROS ABS: 1.1 10*3/uL — AB (ref 1.5–6.5)
RBC: 2.43 10*6/uL — AB (ref 4.20–5.70)
RDW: 18.7 % — ABNORMAL HIGH (ref 11.1–15.7)
WBC: 1.9 10*3/uL — AB (ref 4.0–10.0)

## 2014-10-03 LAB — CMP (CANCER CENTER ONLY)
ALT(SGPT): 71 U/L — ABNORMAL HIGH (ref 10–47)
AST: 40 U/L — AB (ref 11–38)
Albumin: 3.2 g/dL — ABNORMAL LOW (ref 3.3–5.5)
Alkaline Phosphatase: 138 U/L — ABNORMAL HIGH (ref 26–84)
BILIRUBIN TOTAL: 0.8 mg/dL (ref 0.20–1.60)
BUN: 27 mg/dL — AB (ref 7–22)
CO2: 31 meq/L (ref 18–33)
CREATININE: 0.7 mg/dL (ref 0.6–1.2)
Calcium: 9.4 mg/dL (ref 8.0–10.3)
Chloride: 100 mEq/L (ref 98–108)
GLUCOSE: 104 mg/dL (ref 73–118)
Potassium: 4.7 mEq/L (ref 3.3–4.7)
SODIUM: 142 meq/L (ref 128–145)
Total Protein: 6.6 g/dL (ref 6.4–8.1)

## 2014-10-03 LAB — HOLD TUBE, BLOOD BANK - CHCC SATELLITE

## 2014-10-03 LAB — CHCC SATELLITE - SMEAR

## 2014-10-03 MED ORDER — DEXTROSE 5 % IV SOLN
2.0000 g | INTRAVENOUS | Status: DC
  Administered 2014-10-03: 2 g via INTRAVENOUS
  Filled 2014-10-03: qty 2

## 2014-10-03 MED ORDER — SODIUM CHLORIDE 0.9 % IJ SOLN
10.0000 mL | INTRAMUSCULAR | Status: DC | PRN
  Administered 2014-10-03: 10 mL via INTRAVENOUS
  Filled 2014-10-03: qty 10

## 2014-10-03 MED ORDER — DIPHENHYDRAMINE HCL 50 MG/ML IJ SOLN
25.0000 mg | Freq: Once | INTRAMUSCULAR | Status: AC
  Administered 2014-10-03: 25 mg via INTRAVENOUS

## 2014-10-03 MED ORDER — HEPARIN SOD (PORK) LOCK FLUSH 100 UNIT/ML IV SOLN
500.0000 [IU] | Freq: Once | INTRAVENOUS | Status: AC
  Administered 2014-10-03: 500 [IU] via INTRAVENOUS
  Filled 2014-10-03: qty 5

## 2014-10-03 MED ORDER — IPRATROPIUM BROMIDE 0.02 % IN SOLN
0.5000 mg | Freq: Once | RESPIRATORY_TRACT | Status: AC
  Administered 2014-10-03: 0.5 mg via RESPIRATORY_TRACT

## 2014-10-03 MED ORDER — DIPHENHYDRAMINE HCL 50 MG/ML IJ SOLN
INTRAMUSCULAR | Status: AC
  Filled 2014-10-03: qty 1

## 2014-10-03 MED ORDER — IPRATROPIUM-ALBUTEROL 0.5-2.5 (3) MG/3ML IN SOLN
3.0000 mL | Freq: Four times a day (QID) | RESPIRATORY_TRACT | Status: DC
  Filled 2014-10-03: qty 3

## 2014-10-03 MED ORDER — IPRATROPIUM BROMIDE 0.02 % IN SOLN
RESPIRATORY_TRACT | Status: AC
  Filled 2014-10-03: qty 2.5

## 2014-10-03 MED ORDER — ALBUTEROL SULFATE (2.5 MG/3ML) 0.083% IN NEBU
2.5000 mg | INHALATION_SOLUTION | Freq: Once | RESPIRATORY_TRACT | Status: AC
  Administered 2014-10-03: 2.5 mg via RESPIRATORY_TRACT
  Filled 2014-10-03: qty 3

## 2014-10-03 NOTE — Patient Instructions (Addendum)
Implanted Port Home Guide An implanted port is a type of central line that is placed under the skin. Central lines are used to provide IV access when treatment or nutrition needs to be given through a person's veins. Implanted ports are used for long-term IV access. An implanted port may be placed because:   You need IV medicine that would be irritating to the small veins in your hands or arms.   You need long-term IV medicines, such as antibiotics.   You need IV nutrition for a long period.   You need frequent blood draws for lab tests.   You need dialysis.  Implanted ports are usually placed in the chest area, but they can also be placed in the upper arm, the abdomen, or the leg. An implanted port has two main parts:   Reservoir. The reservoir is round and will appear as a small, raised area under your skin. The reservoir is the part where a needle is inserted to give medicines or draw blood.   Catheter. The catheter is a thin, flexible tube that extends from the reservoir. The catheter is placed into a large vein. Medicine that is inserted into the reservoir goes into the catheter and then into the vein.  HOW WILL I CARE FOR MY INCISION SITE? Do not get the incision site wet. Bathe or shower as directed by your health care provider.  HOW IS MY PORT ACCESSED? Special steps must be taken to access the port:   Before the port is accessed, a numbing cream can be placed on the skin. This helps numb the skin over the port site.   Your health care provider uses a sterile technique to access the port.  Your health care provider must put on a mask and sterile gloves.  The skin over your port is cleaned carefully with an antiseptic and allowed to dry.  The port is gently pinched between sterile gloves, and a needle is inserted into the port.  Only "non-coring" port needles should be used to access the port. Once the port is accessed, a blood return should be checked. This helps  ensure that the port is in the vein and is not clogged.   If your port needs to remain accessed for a constant infusion, a clear (transparent) bandage will be placed over the needle site. The bandage and needle will need to be changed every week, or as directed by your health care provider.   Keep the bandage covering the needle clean and dry. Do not get it wet. Follow your health care provider's instructions on how to take a shower or bath while the port is accessed.   If your port does not need to stay accessed, no bandage is needed over the port.  WHAT IS FLUSHING? Flushing helps keep the port from getting clogged. Follow your health care provider's instructions on how and when to flush the port. Ports are usually flushed with saline solution or a medicine called heparin. The need for flushing will depend on how the port is used.   If the port is used for intermittent medicines or blood draws, the port will need to be flushed:   After medicines have been given.   After blood has been drawn.   As part of routine maintenance.   If a constant infusion is running, the port may not need to be flushed.  HOW LONG WILL MY PORT STAY IMPLANTED? The port can stay in for as long as your health care   provider thinks it is needed. When it is time for the port to come out, surgery will be done to remove it. The procedure is similar to the one performed when the port was put in.  WHEN SHOULD I SEEK IMMEDIATE MEDICAL CARE? When you have an implanted port, you should seek immediate medical care if:   You notice a bad smell coming from the incision site.   You have swelling, redness, or drainage at the incision site.   You have more swelling or pain at the port site or the surrounding area.   You have a fever that is not controlled with medicine. Document Released: 01/18/2005 Document Revised: 11/08/2012 Document Reviewed: 09/25/2012 [facility] Patient Information 2015 [place], [place]. This  information is not intended to replace advice given to you by your health care provider. Make sure you discuss any questions you have with your health care provider. Pneumonia Pneumonia is an infection of the lungs.  CAUSES Pneumonia may be caused by bacteria or a virus. Usually, these infections are caused by breathing infectious particles into the lungs (respiratory tract). SIGNS AND SYMPTOMS   Cough.  Fever.  Chest pain.  Increased rate of breathing.  Wheezing.  Mucus production. DIAGNOSIS  If you have the common symptoms of pneumonia, your health care provider will typically confirm the diagnosis with a chest X-ray. The X-ray will show an abnormality in the lung (pulmonary infiltrate) if you have pneumonia. Other tests of your blood, urine, or sputum may be done to find the specific cause of your pneumonia. Your health care provider may also do tests (blood gases or pulse oximetry) to see how well your lungs are working. TREATMENT  Some forms of pneumonia may be spread to other people when you cough or sneeze. You may be asked to wear a mask before and during your exam. Pneumonia that is caused by bacteria is treated with antibiotic medicine. Pneumonia that is caused by the influenza virus may be treated with an antiviral medicine. Most other viral infections must run their course. These infections will not respond to antibiotics.  HOME CARE INSTRUCTIONS   Cough suppressants may be used if you are losing too much rest. However, coughing protects you by clearing your lungs. You should avoid using cough suppressants if you can.  Your health care provider may have prescribed medicine if he or she thinks your pneumonia is caused by bacteria or influenza. Finish your medicine even if you start to feel better.  Your health care provider may also prescribe an expectorant. This loosens the mucus to be coughed up.  Take medicines only as directed by your health care provider.  Do not  smoke. Smoking is a common cause of bronchitis and can contribute to pneumonia. If you are a smoker and continue to smoke, your cough may last several weeks after your pneumonia has cleared.  A cold steam vaporizer or humidifier in your room or home may help loosen mucus.  Coughing is often worse at night. Sleeping in a semi-upright position in a recliner or using a couple pillows under your head will help with this.  Get rest as you feel it is needed. Your body will usually let you know when you need to rest. PREVENTION A pneumococcal shot (vaccine) is available to prevent a common bacterial cause of pneumonia. This is usually suggested for:  People over 86 years old.  Patients on chemotherapy.  People with chronic lung problems, such as bronchitis or emphysema.  People with immune system  problems. If you are over 65 or have a high risk condition, you may receive the pneumococcal vaccine if you have not received it before. In some countries, a routine influenza vaccine is also recommended. This vaccine can help prevent some cases of pneumonia.You may be offered the influenza vaccine as part of your care. If you smoke, it is time to quit. You may receive instructions on how to stop smoking. Your health care provider can provide medicines and counseling to help you quit. SEEK MEDICAL CARE IF: You have a fever. SEEK IMMEDIATE MEDICAL CARE IF:   Your illness becomes worse. This is especially true if you are elderly or weakened from any other disease.  You cannot control your cough with suppressants and are losing sleep.  You begin coughing up blood.  You develop pain which is getting worse or is uncontrolled with medicines.  Any of the symptoms which initially brought you in for treatment are getting worse rather than better.  You develop shortness of breath or chest pain. MAKE SURE YOU:   Understand these instructions.  Will watch your condition.  Will get help right away if  you are not doing well or get worse. Document Released: 01/18/2005 Document Revised: 06/04/2013 Document Reviewed: 04/09/2010 [facility] Patient Information 2015 [place], [place]. This information is not intended to replace advice given to you by your health care provider. Make sure you discuss any questions you have with your health care provider.

## 2014-10-03 NOTE — Progress Notes (Signed)
Hematology and Oncology Follow Up Visit  Jeffery [name] [id number] January 29, 1991 24 y.o. 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 Ferns is here today for the scheduled visit. He is having more respiratory difficulties. His mom is very good at knowing when there is a problem.  We did go ahead and get a chest x-ray on him. The chest X ray, unfortunately, showed that he had pneumonia over in the right middle lobe and right lower lobe.  We also checked his blood counts. His white cell count is down to 1.9. His hemoglobin is 8.3. Thank you, his platelet count is okay.  He gets his tube feeds. He's had no issues with aspiration.  He will get some nebulizer treatments in the office.  Overall, he is hanging in as well as can be expected. He has a lot going guessed him but he really has done very nicely.     Medications:    Medication List       This list is accurate as of: 10/03/14  1:00 PM.  Always use your most recent med list.               acetaminophen 80 MG suppository  Commonly known as:  TYLENOL  Place 480 mg rectally every 4 (four) hours as needed for fever.     acetaminophen 160 MG/5ML solution  Commonly known as:  TYLENOL  480 mg by Gastric Tube route every 6 (six) hours as needed for fever.     albuterol (2.5 MG/3ML) 0.083% nebulizer solution  Commonly known as:  PROVENTIL  Take 2.5 mg by nebulization See admin instructions. Give 1 vial (2.5 mg) twice daily (8am and 8pm) and every 4 hours as needed for shortness of breath or wheezing     albuterol 108 (90 BASE) MCG/ACT inhaler  Commonly known as:  PROVENTIL HFA;VENTOLIN HFA  Inhale 2 puffs into the lungs every 4 (four) hours as needed for shortness of breath.     baclofen 62376 MCG/20ML Soln  Commonly known as:  GABLOFEN  by Intrathecal route continuous. 385.2 mcg in 24  hours     baclofen 10 MG tablet  Commonly known as:  LIORESAL  CRUSH AND GIVE 1 TABLET 3 TIMES PER DAY     bag balm Oint ointment  Apply 1 application topically See admin instructions. Apply to sacral area with each diaper change     BALMEX EX  Apply 1 application topically See admin instructions. Apply to sacral with each diaper change     BOTOX IJ  Inject 300 Units as directed See admin instructions. Every 3 months     calcium carbonate (dosed in mg elemental calcium) 1250 (500 CA) MG/5ML  1,250 mg by Gastric Tube route 3 (three) times daily. 8am , 2pm, and 8pm     cloBAZam 2.5 MG/ML solution  Commonly known as:  ONFI  Place 2.5 mL in gastrostomy tube 3 times daily     DIASTAT ACUDIAL 20 MG Gel  Generic drug:  diazepam  Place 12.5 mg rectally as needed (for seizure lasting 2 minutes or longer or repetitive seizures - no more than 1 dose in 12 hours (not given with nasal versed)).     dicyclomine 10 MG/5ML syrup  Commonly known as:  BENTYL  10 mg by Gastric Tube route every 8 (eight) hours as needed (abdominal cramps).  Do not exceed 5 days in one week     diphenoxylate-atropine 2.5-0.025 MG/5ML liquid  Commonly known as:  LOMOTIL  10 mLs by Gastric Tube route every 6 (six) hours as needed for diarrhea or loose stools.     feeding supplement (PRO-STAT SUGAR FREE 64) Liqd  Take 30 mLs by mouth 2 (two) times daily.     filgrastim 480 MCG/1.6ML injection  Commonly known as:  NEUPOGEN  Inject 1.6 mLs (480 mcg total) into the skin once a week. As directed by [name]     filgrastim 480 MCG/0.8ML Sosy injection  Commonly known as:  NEUPOGEN  Inject 0.8 mLs (480 mcg total) into the skin once.     fluconazole 40 MG/ML suspension  Commonly known as:  DIFLUCAN  Please 100 mg Per Tube daily for 6 days. Qty Suficient     folic acid 1 MG tablet  Commonly known as:  FOLVITE  1 mg by Gastric Tube route daily. 8:00am per G tube     free water Soln  172 mLs by Gastric Tube  route 4 (four) times daily.     furosemide 10 MG/ML solution  Commonly known as:  LASIX  20 mg by Gastric Tube route daily as needed for fluid or edema. Maximum 3 times week     gabapentin 250 MG/5ML solution  Commonly known as:  NEURONTIN  Take 18 mL 3 times daily     ibuprofen 100 MG/5ML suspension  Commonly known as:  ADVIL,MOTRIN  400 mg by Gastric Tube route every 6 (six) hours as needed for fever (pain).     ketoconazole 2 % cream  Commonly known as:  NIZORAL  Apply 1 application topically 2 (two) times daily as needed for irritation (yeast rash from antibiotics).     lansoprazole 30 MG disintegrating tablet  Commonly known as:  PREVACID SOLUTAB  30 mg by Gastric Tube route 2 (two) times daily. 7:30am and 7:30pm     metoCLOPramide 5 MG/5ML solution  Commonly known as:  REGLAN  Place 10 mLs (10 mg total) into feeding tube 4 (four) times daily -  before meals and at bedtime.     midazolam 5 MG/ML injection  Commonly known as:  VERSED  Place 2 mLs (10 mg total) into the nose once. Draw up 66ml in 2 syringes. Remove blue vial access device. Attach syringe to nasal atomizer for intranasal administration. Give 81ml in right nostril x 2 for seizures lasting 2 minutes or longer or for repetitive seizures in a short period of time.     multivitamin Liqd  10 mLs by Gastric Tube route daily.     mupirocin ointment 2 %  Commonly known as:  BACTROBAN  Apply 1 application topically as needed (to G-T site).     oseltamivir 75 MG capsule  Commonly known as:  TAMIFLU  Take 1 capsule (75 mg total) by mouth daily.     OXYGEN  Inhale into the lungs. Oxygen PRN to keep O2 Sat at 90%     potassium chloride 20 MEQ packet  Commonly known as:  KLOR-CON  Give 20 meq daily except twice daily with lasix, given through J port     simethicone 125 MG chewable tablet  Commonly known as:  MYLICON  633 mg by Gastric Tube route See admin instructions. 125mg  caplet added to each can of Two Cal 3  times daily     sodium phosphate enema  Commonly known as:  FLEET  Place  1 enema rectally daily as needed (gas buildup/ bloating).     sucralfate 1 GM/10ML suspension  Commonly known as:  CARAFATE  1 g by Gastric Tube route 4 (four) times daily. 7am, 12pm, 5pm, 7pm Administer alone 30 minutes prior to other medications.     SUDAFED CHILDRENS 15 MG/5ML Liqd  Generic drug:  Pseudoephedrine HCl  30 mg by Gastric Tube route 3 (three) times daily. 6am, 2pm, 8pm     TWOCAL HN Liqd  237 mLs by Gastric Tube route See admin instructions. T.3 can at 46 cc hour x 12 hours - water 172 cc pre and post each and extra 172 bid.     Vitamin C Liqd  300 mLs by Gastric Tube route daily.     ZINC PO  5 mLs by Gastric Tube route daily. 244 mg / 5 mL zinc solution compounded by [facility]        Allergies:  Allergies  Allergen Reactions  . Depakote [Divalproex Sodium]     Causes pancreatitis   . Vimpat [Lacosamide] Rash  . Keppra [Levetiracetam] Other (See Comments)    Bone marrow suppression  . Adhesive [Tape] Other (See Comments)    Rips skin off (paper tape is ok)  . Amoxicillin-Pot Clavulanate Rash    If given with fluconazole rash is not as severe or does not appear at all  . Neulasta [Pegfilgrastim] Other (See Comments)    Fever, tachycardia    Past Medical History, Surgical history, Social history, and Family History were reviewed and updated.  Review of Systems: All other 10 point review of systems is negative.   Physical Exam:  vitals were not taken for this visit.  Wt Readings from Last 3 Encounters:  07/25/14 127 lb 8 oz (57.834 kg)  07/14/14 127 lb 13.9 oz (58 kg)  06/27/14 136 lb (61.689 kg)    Chronically ill-appearing gentleman in a wheelchair. His lungs show wheezes bilaterally. He has crackles over on the right side. He does have decent air movement bilaterally. Cardiac exam is tachycardic but regular. He has no murmurs, rubs or bruits. Abdomen is soft. He  has decent bowel sounds. Extremities shows no clubbing, cyanosis or edema.  Lab Results  Component Value Date   WBC 1.9* 10/03/2014   HGB 8.3* 10/03/2014   HCT 27.3* 10/03/2014   MCV 112* 10/03/2014   PLT 155 Large platelets present 10/03/2014   Lab Results  Component Value Date   FERRITIN 1054* 06/22/2014   IRON 19* 06/22/2014   TIBC 133* 06/22/2014   UIBC 114 06/22/2014   IRONPCTSAT 14* 06/22/2014   Lab Results  Component Value Date   RETICCTPCT 2.8* 07/03/2014   RBC 2.43* 10/03/2014   RETICCTABS 99.1 07/03/2014   No results found for: KPAFRELGTCHN, LAMBDASER, KAPLAMBRATIO No results found for: IGGSERUM, IGA, IGMSERUM No results found for: Odetta Pink, SPEI   Chemistry      Component Value Date/Time   NA 142 10/03/2014 1200   NA 144 07/25/2014 1634   K 4.7 10/03/2014 1200   K 3.9 07/25/2014 1634   CL 100 10/03/2014 1200   CL 103 07/25/2014 1634   CO2 31 10/03/2014 1200   CO2 32 07/25/2014 1634   BUN 27* 10/03/2014 1200   BUN 10 07/25/2014 1634   CREATININE 0.7 10/03/2014 1200   CREATININE 0.93 07/25/2014 1634      Component Value Date/Time   CALCIUM 9.4 10/03/2014 1200   CALCIUM 8.7* 07/25/2014  1634   ALKPHOS 138* 10/03/2014 1200   ALKPHOS 224* 07/20/2014 1629   AST 40* 10/03/2014 1200   AST 35 07/20/2014 1629   ALT 71* 10/03/2014 1200   ALT 160* 07/20/2014 1629   BILITOT 0.80 10/03/2014 1200   BILITOT 1.5* 07/20/2014 1629     Impression and Plan: [name] is a 24 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 He also has cerebral palsy and is bed/wheelchair bound. He is doing well at this time.  He now has right middle lobe and right lower lobe pneumonia. We want to try to keep him out of the hospital.  I will go ahead and give him IV Rocephin in the office today and tomorrow.  He needs Neupogen. He appeared has a reaction to Neupogen so he will get Benadryl before he gets Neupogen. His mother will  supply the Neupogen.  With his hemoglobin of 8.3, he will get transfused tomorrow. He will get 2 units of blood. I think this will be incredibly important to keep [facility], particular over the holiday weekend.  We will continue to follow him along closely.  I will like to have him come back to see [place] in another 2-3 weeks.  We spent about 35 minutes with him.   Volanda Napoleon, MD 9/1/20161:00 PM

## 2014-10-03 NOTE — Addendum Note (Signed)
Addended by: Arbutus Ped on: 10/03/2014 01:29 PM   Modules accepted: Orders

## 2014-10-04 ENCOUNTER — Ambulatory Visit (HOSPITAL_BASED_OUTPATIENT_CLINIC_OR_DEPARTMENT_OTHER): Payer: Medicaid Other

## 2014-10-04 ENCOUNTER — Ambulatory Visit (HOSPITAL_COMMUNITY)
Admission: RE | Admit: 2014-10-04 | Discharge: 2014-10-04 | Disposition: A | Discharge status: Home / Self Care | Payer: Medicaid Other | Source: Ambulatory Visit | Attending: Hematology & Oncology | Admitting: Hematology & Oncology

## 2014-10-04 ENCOUNTER — Other Ambulatory Visit: Payer: Self-pay | Admitting: Hematology & Oncology

## 2014-10-04 VITALS — BP 122/60 | HR 120 | Temp 99.0°F | Resp 20

## 2014-10-04 DIAGNOSIS — J189 Pneumonia, unspecified organism: Secondary | ICD-10-CM

## 2014-10-04 DIAGNOSIS — B59 Pneumocystosis: Secondary | ICD-10-CM

## 2014-10-04 DIAGNOSIS — D61818 Other pancytopenia: Secondary | ICD-10-CM | POA: Diagnosis not present

## 2014-10-04 DIAGNOSIS — J45901 Unspecified asthma with (acute) exacerbation: Secondary | ICD-10-CM

## 2014-10-04 DIAGNOSIS — J9621 Acute and chronic respiratory failure with hypoxia: Secondary | ICD-10-CM

## 2014-10-04 LAB — PREPARE RBC (CROSSMATCH)

## 2014-10-04 MED ORDER — SODIUM CHLORIDE 0.9 % IV SOLN
250.0000 mL | Freq: Once | INTRAVENOUS | Status: AC
  Administered 2014-10-04: 250 mL via INTRAVENOUS

## 2014-10-04 MED ORDER — CEFDINIR 250 MG/5ML PO SUSR: ORAL | Status: DC

## 2014-10-04 MED ORDER — SODIUM CHLORIDE 0.9 % IJ SOLN
10.0000 mL | INTRAMUSCULAR | Status: AC | PRN
  Administered 2014-10-04: 10 mL
  Filled 2014-10-04: qty 10

## 2014-10-04 MED ORDER — FUROSEMIDE 10 MG/ML IJ SOLN
10.0000 mg | Freq: Once | INTRAMUSCULAR | Status: AC
Start: 2014-10-04 — End: 2014-10-04
  Administered 2014-10-04: 10 mg via INTRAVENOUS

## 2014-10-04 MED ORDER — FUROSEMIDE 10 MG/ML IJ SOLN
INTRAMUSCULAR | Status: AC
  Filled 2014-10-04: qty 4

## 2014-10-04 MED ORDER — HEPARIN SOD (PORK) LOCK FLUSH 100 UNIT/ML IV SOLN
500.0000 [IU] | Freq: Every day | INTRAVENOUS | Status: AC | PRN
  Administered 2014-10-04: 500 [IU]
  Filled 2014-10-04: qty 5

## 2014-10-04 MED ORDER — FUROSEMIDE 10 MG/ML IJ SOLN
20.0000 mg | Freq: Once | INTRAMUSCULAR | Status: AC
  Administered 2014-10-04: 10 mg via INTRAVENOUS

## 2014-10-04 MED ORDER — DEXTROSE 5 % IV SOLN
2.0000 g | Freq: Once | INTRAVENOUS | Status: AC
  Administered 2014-10-04: 2 g via INTRAVENOUS
  Filled 2014-10-04: qty 2

## 2014-10-04 NOTE — Progress Notes (Signed)
Tylenol 650 mg given by mother at 7:00am this am.   1030  Patient axillary temp 101.8.  Dr. Marin Olp notified of jump in temperature.  Ordered another dose of Tylenol 500 mg via g-tube which was administered by mother.  Dr. Marin Olp ordered to proceed with blood.

## 2014-10-04 NOTE — Patient Instructions (Signed)

## 2014-10-06 LAB — TYPE AND SCREEN
ABO/RH(D): O POS
Antibody Screen: NEGATIVE
UNIT DIVISION: 0
UNIT DIVISION: 0

## 2014-10-08 ENCOUNTER — Telehealth: Payer: Self-pay | Admitting: *Deleted

## 2014-10-08 NOTE — Telephone Encounter (Signed)
Jeffery [name] is upset because [name] won't be out to the house to draw labs until this afternoon. She wants labs drawn in the morning so that she can be directed to give patient his Neupogen on the same day as the blood draw. She has spoken to [name] and for them to guarantee an am lab draw, the order needs to specify morning draw.  Spoke to [name] who is fine with adding the specification to the order. [name] called and order given to [name]. [name] aware of orders given.

## 2014-10-09 ENCOUNTER — Inpatient Hospital Stay (HOSPITAL_COMMUNITY)
Admission: EM | Admit: 2014-10-09 | Discharge: 2014-10-20 | DRG: 871 | Disposition: A | Discharge status: Home / Self Care | Payer: Medicaid Other | Attending: Internal Medicine | Admitting: Internal Medicine

## 2014-10-09 ENCOUNTER — Other Ambulatory Visit: Payer: Self-pay

## 2014-10-09 ENCOUNTER — Encounter (HOSPITAL_COMMUNITY): Payer: Self-pay | Admitting: Emergency Medicine

## 2014-10-09 ENCOUNTER — Emergency Department (HOSPITAL_COMMUNITY): Payer: Medicaid Other

## 2014-10-09 DIAGNOSIS — R0603 Acute respiratory distress: Secondary | ICD-10-CM | POA: Diagnosis present

## 2014-10-09 DIAGNOSIS — T68XXXA Hypothermia, initial encounter: Secondary | ICD-10-CM | POA: Diagnosis present

## 2014-10-09 DIAGNOSIS — Z8673 Personal history of transient ischemic attack (TIA), and cerebral infarction without residual deficits: Secondary | ICD-10-CM

## 2014-10-09 DIAGNOSIS — R740 Nonspecific elevation of levels of transaminase and lactic acid dehydrogenase [LDH]: Secondary | ICD-10-CM | POA: Diagnosis present

## 2014-10-09 DIAGNOSIS — K529 Noninfective gastroenteritis and colitis, unspecified: Secondary | ICD-10-CM | POA: Diagnosis present

## 2014-10-09 DIAGNOSIS — J811 Chronic pulmonary edema: Secondary | ICD-10-CM

## 2014-10-09 DIAGNOSIS — R6520 Severe sepsis without septic shock: Secondary | ICD-10-CM | POA: Diagnosis present

## 2014-10-09 DIAGNOSIS — J9621 Acute and chronic respiratory failure with hypoxia: Secondary | ICD-10-CM | POA: Diagnosis present

## 2014-10-09 DIAGNOSIS — G808 Other cerebral palsy: Secondary | ICD-10-CM | POA: Diagnosis present

## 2014-10-09 DIAGNOSIS — Z79899 Other long term (current) drug therapy: Secondary | ICD-10-CM

## 2014-10-09 DIAGNOSIS — J9601 Acute respiratory failure with hypoxia: Secondary | ICD-10-CM | POA: Diagnosis not present

## 2014-10-09 DIAGNOSIS — B3749 Other urogenital candidiasis: Secondary | ICD-10-CM | POA: Diagnosis present

## 2014-10-09 DIAGNOSIS — G4733 Obstructive sleep apnea (adult) (pediatric): Secondary | ICD-10-CM | POA: Diagnosis present

## 2014-10-09 DIAGNOSIS — E876 Hypokalemia: Secondary | ICD-10-CM | POA: Diagnosis present

## 2014-10-09 DIAGNOSIS — Z434 Encounter for attention to other artificial openings of digestive tract: Secondary | ICD-10-CM

## 2014-10-09 DIAGNOSIS — G40409 Other generalized epilepsy and epileptic syndromes, not intractable, without status epilepticus: Secondary | ICD-10-CM | POA: Diagnosis present

## 2014-10-09 DIAGNOSIS — J690 Pneumonitis due to inhalation of food and vomit: Secondary | ICD-10-CM | POA: Diagnosis present

## 2014-10-09 DIAGNOSIS — E872 Acidosis, unspecified: Secondary | ICD-10-CM | POA: Diagnosis present

## 2014-10-09 DIAGNOSIS — Z66 Do not resuscitate: Secondary | ICD-10-CM | POA: Diagnosis present

## 2014-10-09 DIAGNOSIS — R569 Unspecified convulsions: Secondary | ICD-10-CM | POA: Diagnosis present

## 2014-10-09 DIAGNOSIS — J90 Pleural effusion, not elsewhere classified: Secondary | ICD-10-CM | POA: Diagnosis not present

## 2014-10-09 DIAGNOSIS — Z419 Encounter for procedure for purposes other than remedying health state, unspecified: Secondary | ICD-10-CM

## 2014-10-09 DIAGNOSIS — R7989 Other specified abnormal findings of blood chemistry: Secondary | ICD-10-CM | POA: Diagnosis present

## 2014-10-09 DIAGNOSIS — D61818 Other pancytopenia: Secondary | ICD-10-CM | POA: Diagnosis present

## 2014-10-09 DIAGNOSIS — E860 Dehydration: Secondary | ICD-10-CM | POA: Diagnosis present

## 2014-10-09 DIAGNOSIS — J189 Pneumonia, unspecified organism: Secondary | ICD-10-CM | POA: Diagnosis not present

## 2014-10-09 DIAGNOSIS — A419 Sepsis, unspecified organism: Secondary | ICD-10-CM | POA: Diagnosis present

## 2014-10-09 DIAGNOSIS — R000 Tachycardia, unspecified: Secondary | ICD-10-CM | POA: Diagnosis present

## 2014-10-09 DIAGNOSIS — G40309 Generalized idiopathic epilepsy and epileptic syndromes, not intractable, without status epilepticus: Secondary | ICD-10-CM | POA: Diagnosis present

## 2014-10-09 DIAGNOSIS — R945 Abnormal results of liver function studies: Secondary | ICD-10-CM

## 2014-10-09 DIAGNOSIS — R7401 Elevation of levels of liver transaminase levels: Secondary | ICD-10-CM | POA: Diagnosis present

## 2014-10-09 DIAGNOSIS — R0602 Shortness of breath: Secondary | ICD-10-CM | POA: Diagnosis present

## 2014-10-09 DIAGNOSIS — B9689 Other specified bacterial agents as the cause of diseases classified elsewhere: Secondary | ICD-10-CM | POA: Diagnosis present

## 2014-10-09 DIAGNOSIS — R1310 Dysphagia, unspecified: Secondary | ICD-10-CM | POA: Diagnosis not present

## 2014-10-09 DIAGNOSIS — G8250 Quadriplegia, unspecified: Secondary | ICD-10-CM | POA: Diagnosis present

## 2014-10-09 DIAGNOSIS — R532 Functional quadriplegia: Secondary | ICD-10-CM | POA: Diagnosis present

## 2014-10-09 DIAGNOSIS — M810 Age-related osteoporosis without current pathological fracture: Secondary | ICD-10-CM | POA: Diagnosis present

## 2014-10-09 DIAGNOSIS — D649 Anemia, unspecified: Secondary | ICD-10-CM | POA: Diagnosis not present

## 2014-10-09 DIAGNOSIS — G800 Spastic quadriplegic cerebral palsy: Secondary | ICD-10-CM | POA: Diagnosis present

## 2014-10-09 DIAGNOSIS — I471 Supraventricular tachycardia: Secondary | ICD-10-CM

## 2014-10-09 DIAGNOSIS — R509 Fever, unspecified: Secondary | ICD-10-CM | POA: Diagnosis present

## 2014-10-09 DIAGNOSIS — D619 Aplastic anemia, unspecified: Secondary | ICD-10-CM | POA: Diagnosis not present

## 2014-10-09 DIAGNOSIS — G809 Cerebral palsy, unspecified: Secondary | ICD-10-CM | POA: Diagnosis present

## 2014-10-09 LAB — I-STAT CG4 LACTIC ACID, ED
LACTIC ACID, VENOUS: 3.7 mmol/L — AB (ref 0.5–2.0)
Lactic Acid, Venous: 3.17 mmol/L (ref 0.5–2.0)
Lactic Acid, Venous: 2.48 mmol/L (ref 0.5–2.0)

## 2014-10-09 LAB — CBC WITH DIFFERENTIAL/PLATELET
BASOS ABS: 0 10*3/uL (ref 0.0–0.1)
BASOS PCT: 0 % (ref 0–1)
EOS PCT: 0 % (ref 0–5)
Eosinophils Absolute: 0 10*3/uL (ref 0.0–0.7)
HCT: 41 % (ref 39.0–52.0)
Hemoglobin: 12.4 g/dL — ABNORMAL LOW (ref 13.0–17.0)
LYMPHS PCT: 9 % — AB (ref 12–46)
Lymphs Abs: 0.4 10*3/uL — ABNORMAL LOW (ref 0.7–4.0)
MCH: 32.7 pg (ref 26.0–34.0)
MCHC: 30.2 g/dL (ref 30.0–36.0)
MCV: 108.2 fL — AB (ref 78.0–100.0)
Monocytes Absolute: 0.8 10*3/uL (ref 0.1–1.0)
Monocytes Relative: 16 % — ABNORMAL HIGH (ref 3–12)
Neutro Abs: 3.9 10*3/uL (ref 1.7–7.7)
Neutrophils Relative %: 75 % (ref 43–77)
PLATELETS: 171 10*3/uL (ref 150–400)
RBC: 3.79 MIL/uL — AB (ref 4.22–5.81)
RDW: 18.5 % — ABNORMAL HIGH (ref 11.5–15.5)
WBC: 5.1 10*3/uL (ref 4.0–10.5)

## 2014-10-09 LAB — APTT: aPTT: 30 seconds (ref 24–37)

## 2014-10-09 LAB — COMPREHENSIVE METABOLIC PANEL
ALT: 148 U/L — ABNORMAL HIGH (ref 17–63)
ANION GAP: 10 (ref 5–15)
AST: 123 U/L — AB (ref 15–41)
Albumin: 2.3 g/dL — ABNORMAL LOW (ref 3.5–5.0)
Alkaline Phosphatase: 140 U/L — ABNORMAL HIGH (ref 38–126)
BILIRUBIN TOTAL: 0.4 mg/dL (ref 0.3–1.2)
BUN: 50 mg/dL — AB (ref 6–20)
CALCIUM: 8.4 mg/dL — AB (ref 8.9–10.3)
CO2: 31 mmol/L (ref 22–32)
Chloride: 104 mmol/L (ref 101–111)
Creatinine, Ser: 0.99 mg/dL (ref 0.61–1.24)
GFR calc Af Amer: >60 mL/min (ref >60)
Glucose, Bld: 114 mg/dL — ABNORMAL HIGH (ref 65–99)
POTASSIUM: 4 mmol/L (ref 3.5–5.1)
Sodium: 145 mmol/L (ref 135–145)
TOTAL PROTEIN: 5.5 g/dL — AB (ref 6.5–8.1)

## 2014-10-09 LAB — URINALYSIS, ROUTINE W REFLEX MICROSCOPIC
BILIRUBIN URINE: NEGATIVE
Glucose, UA: NEGATIVE mg/dL
KETONES UR: NEGATIVE mg/dL
LEUKOCYTES UA: NEGATIVE
NITRITE: NEGATIVE
PH: 6 (ref 5.0–8.0)
Specific Gravity, Urine: 1.025 (ref 1.005–1.030)
UROBILINOGEN UA: 0.2 mg/dL (ref 0.0–1.0)

## 2014-10-09 LAB — URINE MICROSCOPIC-ADD ON

## 2014-10-09 LAB — PROCALCITONIN: PROCALCITONIN: 0.66 ng/mL

## 2014-10-09 LAB — GLUCOSE, CAPILLARY
GLUCOSE-CAPILLARY: 101 mg/dL — AB (ref 65–99)
GLUCOSE-CAPILLARY: 112 mg/dL — AB (ref 65–99)

## 2014-10-09 LAB — CBG MONITORING, ED
GLUCOSE-CAPILLARY: 120 mg/dL — AB (ref 65–99)
GLUCOSE-CAPILLARY: 91 mg/dL (ref 65–99)
Glucose-Capillary: 131 mg/dL — ABNORMAL HIGH (ref 65–99)
Glucose-Capillary: 102 mg/dL — ABNORMAL HIGH (ref 65–99)

## 2014-10-09 LAB — LACTIC ACID, PLASMA
LACTIC ACID, VENOUS: 3 mmol/L — AB (ref 0.5–2.0)
LACTIC ACID, VENOUS: 2.7 mmol/L — AB (ref 0.5–2.0)

## 2014-10-09 LAB — PROTIME-INR
INR: 1.11 (ref 0.00–1.49)
Prothrombin Time: 14.5 seconds (ref 11.6–15.2)

## 2014-10-09 LAB — MRSA PCR SCREENING: MRSA by PCR: NEGATIVE

## 2014-10-09 LAB — STREP PNEUMONIAE URINARY ANTIGEN: STREP PNEUMO URINARY ANTIGEN: NEGATIVE

## 2014-10-09 MED ORDER — INSULIN ASPART 100 UNIT/ML ~~LOC~~ SOLN
0.0000 [IU] | SUBCUTANEOUS | Status: DC
  Administered 2014-10-18: 1 [IU] via SUBCUTANEOUS

## 2014-10-09 MED ORDER — SODIUM CHLORIDE 0.9 % IJ SOLN
3.0000 mL | Freq: Two times a day (BID) | INTRAMUSCULAR | Status: DC
  Administered 2014-10-12 – 2014-10-14 (×5): 3 mL via INTRAVENOUS
  Administered 2014-10-14: 10 mL via INTRAVENOUS
  Administered 2014-10-15 – 2014-10-20 (×10): 3 mL via INTRAVENOUS

## 2014-10-09 MED ORDER — PSEUDOEPHEDRINE HCL 30 MG/5ML PO LIQD
30.0000 mg | ORAL | Status: DC
  Administered 2014-10-09 – 2014-10-20 (×23): 30 mg via JEJUNOSTOMY
  Filled 2014-10-09 (×35): qty 5

## 2014-10-09 MED ORDER — OMEPRAZOLE 2 MG/ML ORAL SUSPENSION
20.0000 mg | Freq: Two times a day (BID) | ORAL | Status: DC
  Administered 2014-10-12 – 2014-10-20 (×15): 20 mg via ORAL
  Filled 2014-10-09 (×24): qty 10

## 2014-10-09 MED ORDER — ACETAMINOPHEN 160 MG/5ML PO SOLN: 15.0000 mg/kg | Freq: Once | ORAL | Status: DC

## 2014-10-09 MED ORDER — SODIUM CHLORIDE 0.9 % IV SOLN
INTRAVENOUS | Status: DC
  Administered 2014-10-09: 10:00:00 via INTRAVENOUS

## 2014-10-09 MED ORDER — ADULT MULTIVITAMIN LIQUID CH
10.0000 mL | Freq: Every day | ORAL | Status: DC
  Administered 2014-10-13 – 2014-10-20 (×7): 10 mL
  Filled 2014-10-09 (×15): qty 10

## 2014-10-09 MED ORDER — PRO-STAT SUGAR FREE PO LIQD
30.0000 mL | Freq: Two times a day (BID) | ORAL | Status: DC
  Administered 2014-10-12 – 2014-10-20 (×12): 30 mL
  Filled 2014-10-09 (×23): qty 30

## 2014-10-09 MED ORDER — IPRATROPIUM-ALBUTEROL 0.5-2.5 (3) MG/3ML IN SOLN
3.0000 mL | Freq: Once | RESPIRATORY_TRACT | Status: AC
  Administered 2014-10-09: 3 mL via RESPIRATORY_TRACT
  Filled 2014-10-09: qty 3

## 2014-10-09 MED ORDER — DEXTROSE-NACL 5-0.45 % IV SOLN
INTRAVENOUS | Status: AC
  Administered 2014-10-09: 11:00:00 via INTRAVENOUS

## 2014-10-09 MED ORDER — CLOBAZAM 2.5 MG/ML PO SUSP
2.5000 mg | Freq: Three times a day (TID) | ORAL | Status: DC
  Filled 2014-10-09 (×2): qty 4

## 2014-10-09 MED ORDER — VANCOMYCIN HCL IN DEXTROSE 750-5 MG/150ML-% IV SOLN
750.0000 mg | Freq: Two times a day (BID) | INTRAVENOUS | Status: DC
  Administered 2014-10-09 – 2014-10-12 (×6): 750 mg via INTRAVENOUS
  Filled 2014-10-09 (×8): qty 150

## 2014-10-09 MED ORDER — TWOCAL HN PO LIQD
237.0000 mL | ORAL | Status: DC
  Filled 2014-10-09: qty 237

## 2014-10-09 MED ORDER — POTASSIUM CHLORIDE 20 MEQ/15ML (10%) PO SOLN
20.0000 meq | Freq: Every day | ORAL | Status: DC
  Filled 2014-10-09 (×2): qty 15

## 2014-10-09 MED ORDER — CLOBAZAM 2.5 MG/ML PO SUSP
6.2500 mg | Freq: Three times a day (TID) | ORAL | Status: DC
  Administered 2014-10-09: 6.25 mg

## 2014-10-09 MED ORDER — GABAPENTIN 250 MG/5ML PO SOLN
900.0000 mg | Freq: Three times a day (TID) | ORAL | Status: DC
  Filled 2014-10-09 (×3): qty 18

## 2014-10-09 MED ORDER — DICYCLOMINE HCL 10 MG/5ML PO SOLN
10.0000 mg | Freq: Three times a day (TID) | ORAL | Status: DC | PRN
  Filled 2014-10-09: qty 5

## 2014-10-09 MED ORDER — FREE WATER
180.0000 mL | Status: DC
  Administered 2014-10-10: 120 mL
  Administered 2014-10-13: 180 mL
  Administered 2014-10-13: 120 mL
  Administered 2014-10-13 – 2014-10-20 (×29): 180 mL

## 2014-10-09 MED ORDER — PIPERACILLIN-TAZOBACTAM 3.375 G IVPB
3.3750 g | Freq: Three times a day (TID) | INTRAVENOUS | Status: DC
  Administered 2014-10-09 – 2014-10-11 (×5): 3.375 g via INTRAVENOUS
  Filled 2014-10-09 (×8): qty 50

## 2014-10-09 MED ORDER — PRO-STAT SUGAR FREE PO LIQD
30.0000 mL | Freq: Two times a day (BID) | ORAL | Status: DC
  Filled 2014-10-09: qty 30

## 2014-10-09 MED ORDER — FREE WATER: 180.0000 mL | Status: DC

## 2014-10-09 MED ORDER — DIPHENOXYLATE-ATROPINE 2.5-0.025 MG/5ML PO LIQD
10.0000 mL | Freq: Four times a day (QID) | ORAL | Status: DC | PRN
  Administered 2014-10-11 – 2014-10-19 (×4): 10 mL
  Filled 2014-10-09 (×6): qty 10

## 2014-10-09 MED ORDER — JEVITY 1.2 CAL PO LIQD: 1000.0000 mL | ORAL | Status: DC

## 2014-10-09 MED ORDER — DIAZEPAM 10 MG RE GEL: 12.5000 mg | RECTAL | Status: DC | PRN

## 2014-10-09 MED ORDER — BACLOFEN 40000 MCG/20ML IT SOLN: 40000.0000 ug | INTRATHECAL | Status: DC

## 2014-10-09 MED ORDER — GUAIFENESIN 100 MG/5ML PO SOLN
30.0000 mL | Freq: Two times a day (BID) | ORAL | Status: DC
  Administered 2014-10-09 – 2014-10-20 (×15): 600 mg
  Filled 2014-10-09 (×24): qty 30

## 2014-10-09 MED ORDER — LIDOCAINE 5 % EX OINT: TOPICAL_OINTMENT | Freq: Four times a day (QID) | CUTANEOUS | Status: DC | PRN

## 2014-10-09 MED ORDER — ACETAMINOPHEN 120 MG RE SUPP
480.0000 mg | RECTAL | Status: DC | PRN
Start: 2014-10-09 — End: 2014-10-20
  Administered 2014-10-10 – 2014-10-20 (×5): 480 mg via RECTAL
  Filled 2014-10-09 (×8): qty 4

## 2014-10-09 MED ORDER — SIMETHICONE 80 MG PO CHEW
160.0000 mg | CHEWABLE_TABLET | Freq: Four times a day (QID) | ORAL | Status: DC | PRN
  Administered 2014-10-09: 80 mg via ORAL
  Filled 2014-10-09 (×3): qty 2

## 2014-10-09 MED ORDER — TWOCAL HN PO LIQD
545.0000 mL | ORAL | Status: DC
  Administered 2014-10-09 – 2014-10-12 (×2): 237 mL
  Administered 2014-10-13 – 2014-10-19 (×6): 545 mL
  Filled 2014-10-09 (×12): qty 711

## 2014-10-09 MED ORDER — MUPIROCIN 2 % EX OINT: 1.0000 "application " | TOPICAL_OINTMENT | Freq: Three times a day (TID) | CUTANEOUS | Status: DC | PRN

## 2014-10-09 MED ORDER — SUCRALFATE 1 GM/10ML PO SUSP
1.0000 g | Freq: Two times a day (BID) | ORAL | Status: DC
  Administered 2014-10-09 – 2014-10-12 (×4): 1 g
  Filled 2014-10-09 (×8): qty 10

## 2014-10-09 MED ORDER — BAG BALM OINTMENT
1.0000 "application " | TOPICAL_OINTMENT | CUTANEOUS | Status: DC | PRN
  Administered 2014-10-10: 1 g via TOPICAL
  Filled 2014-10-09 (×2): qty 300

## 2014-10-09 MED ORDER — PSEUDOEPHEDRINE HCL 15 MG/5ML PO LIQD
30.0000 mg | ORAL | Status: DC
  Filled 2014-10-09 (×3): qty 10

## 2014-10-09 MED ORDER — ACETAMINOPHEN 650 MG RE SUPP
650.0000 mg | Freq: Once | RECTAL | Status: AC
  Administered 2014-10-09: 650 mg via RECTAL
  Filled 2014-10-09: qty 1

## 2014-10-09 MED ORDER — ZINC OXIDE 11.3 % EX CREA
TOPICAL_CREAM | CUTANEOUS | Status: DC | PRN
  Filled 2014-10-09: qty 56

## 2014-10-09 MED ORDER — MIDAZOLAM 5 MG/ML PEDIATRIC INJ FOR INTRANASAL/SUBLINGUAL USE: 10.0000 mg | INTRAMUSCULAR | Status: DC | PRN

## 2014-10-09 MED ORDER — METOCLOPRAMIDE HCL 5 MG/5ML PO SOLN
10.0000 mg | Freq: Three times a day (TID) | ORAL | Status: DC
  Administered 2014-10-09 – 2014-10-20 (×29): 10 mg
  Filled 2014-10-09 (×50): qty 10

## 2014-10-09 MED ORDER — IBUPROFEN 100 MG/5ML PO SUSP
400.0000 mg | Freq: Four times a day (QID) | ORAL | Status: DC | PRN
  Administered 2014-10-09: 400 mg
  Filled 2014-10-09 (×2): qty 20

## 2014-10-09 MED ORDER — GABAPENTIN 250 MG/5ML PO SOLN
900.0000 mg | Freq: Three times a day (TID) | ORAL | Status: DC
  Administered 2014-10-09 – 2014-10-20 (×31): 900 mg
  Filled 2014-10-09 (×36): qty 18

## 2014-10-09 MED ORDER — PIPERACILLIN-TAZOBACTAM 3.375 G IVPB 30 MIN
3.3750 g | Freq: Once | INTRAVENOUS | Status: AC
  Administered 2014-10-09: 3.375 g via INTRAVENOUS

## 2014-10-09 MED ORDER — SODIUM CHLORIDE 0.9 % IV BOLUS (SEPSIS)
1000.0000 mL | Freq: Once | INTRAVENOUS | Status: AC
  Administered 2014-10-09: 1000 mL via INTRAVENOUS

## 2014-10-09 MED ORDER — ARFORMOTEROL TARTRATE 15 MCG/2ML IN NEBU
15.0000 ug | INHALATION_SOLUTION | Freq: Two times a day (BID) | RESPIRATORY_TRACT | Status: DC
  Administered 2014-10-10 – 2014-10-20 (×21): 15 ug via RESPIRATORY_TRACT
  Filled 2014-10-09 (×25): qty 2

## 2014-10-09 MED ORDER — ACETAMINOPHEN 160 MG/5ML PO SOLN
500.0000 mg | Freq: Four times a day (QID) | ORAL | Status: DC | PRN
  Administered 2014-10-09 – 2014-10-16 (×4): 500 mg
  Filled 2014-10-09 (×4): qty 20.3

## 2014-10-09 MED ORDER — BUDESONIDE 0.5 MG/2ML IN SUSP
0.5000 mg | Freq: Two times a day (BID) | RESPIRATORY_TRACT | Status: DC
  Administered 2014-10-10 – 2014-10-20 (×21): 0.5 mg via RESPIRATORY_TRACT
  Filled 2014-10-09 (×25): qty 2

## 2014-10-09 MED ORDER — VANCOMYCIN HCL IN DEXTROSE 1-5 GM/200ML-% IV SOLN: 1000.0000 mg | Freq: Once | INTRAVENOUS | Status: DC

## 2014-10-09 MED ORDER — PIPERACILLIN-TAZOBACTAM 3.375 G IVPB
3.3750 g | Freq: Three times a day (TID) | INTRAVENOUS | Status: DC
  Filled 2014-10-09: qty 50

## 2014-10-09 MED ORDER — LEVALBUTEROL HCL 0.63 MG/3ML IN NEBU
0.6300 mg | INHALATION_SOLUTION | RESPIRATORY_TRACT | Status: DC | PRN
  Administered 2014-10-09 – 2014-10-18 (×5): 0.63 mg via RESPIRATORY_TRACT
  Filled 2014-10-09 (×4): qty 3

## 2014-10-09 MED ORDER — CLOBAZAM 2.5 MG/ML PO SUSP
6.2500 mg | ORAL | Status: DC
  Administered 2014-10-09 – 2014-10-20 (×31): 6.25 mg
  Filled 2014-10-09 (×32): qty 4

## 2014-10-09 MED ORDER — DISPOSABLE ENEMA 19-7 GM/118ML RE ENEM: 1.0000 | ENEMA | Freq: Every day | RECTAL | Status: DC | PRN

## 2014-10-09 MED ORDER — ENOXAPARIN SODIUM 40 MG/0.4ML ~~LOC~~ SOLN
40.0000 mg | SUBCUTANEOUS | Status: DC
  Administered 2014-10-09: 40 mg via SUBCUTANEOUS
  Filled 2014-10-09 (×3): qty 0.4

## 2014-10-09 MED ORDER — LEVALBUTEROL HCL 0.63 MG/3ML IN NEBU
0.6300 mg | INHALATION_SOLUTION | Freq: Four times a day (QID) | RESPIRATORY_TRACT | Status: DC
  Administered 2014-10-09 – 2014-10-20 (×42): 0.63 mg via RESPIRATORY_TRACT
  Filled 2014-10-09 (×82): qty 3

## 2014-10-09 MED ORDER — VANCOMYCIN HCL IN DEXTROSE 1-5 GM/200ML-% IV SOLN
1000.0000 mg | Freq: Once | INTRAVENOUS | Status: AC
  Administered 2014-10-09: 1000 mg via INTRAVENOUS
  Filled 2014-10-09: qty 200

## 2014-10-09 NOTE — ED Notes (Signed)
CBG 120 

## 2014-10-09 NOTE — ED Notes (Signed)
Attending physician at bedside

## 2014-10-09 NOTE — ED Notes (Signed)
Pt presents with [facility] for fever of 105 rectally; pt is non-verbal and has hx of CP; caregiver is historian and states pt had thick yellow sputum and xray thurs which revealed a RIGHT middle and lower lobe PNA and received IV rocephin and blood transfusion x 2 (hgb initially 8); pts caregiver gave 650mg  tylenol suppository PTA

## 2014-10-09 NOTE — Progress Notes (Signed)
ANTIBIOTIC CONSULT NOTE - INITIAL  Pharmacy Consult for Vancomycin and Zosyn Indication: rule out pneumonia  Allergies  Allergen Reactions  . Depakote [Divalproex Sodium]     Causes pancreatitis   . Vimpat [Lacosamide] Rash  . Keppra [Levetiracetam] Other (See Comments)    Bone marrow suppression  . Adhesive [Tape] Other (See Comments)    Rips skin off (paper tape is ok)  . Amoxicillin-Pot Clavulanate Rash    If given with fluconazole rash is not as severe or does not appear at all  . Neulasta [Pegfilgrastim] Other (See Comments)    Fever, tachycardia    Patient Measurements:    Vital Signs: Temp: 103.9 F (39.9 C) (09/07 0711) Temp Source: Rectal (09/07 0711) BP: 117/100 mmHg (09/07 0730) Pulse Rate: 140 (09/07 0730) Intake/Output from previous day:   Intake/Output from this shift:    Labs: No results for input(s): WBC, HGB, PLT, LABCREA, CREATININE in the last 72 hours. CrCl cannot be calculated (Unknown ideal weight.). No results for input(s): VANCOTROUGH, VANCOPEAK, VANCORANDOM, GENTTROUGH, GENTPEAK, GENTRANDOM, TOBRATROUGH, TOBRAPEAK, TOBRARND, AMIKACINPEAK, AMIKACINTROU, AMIKACIN in the last 72 hours.   Microbiology: No results found for this or any previous visit (from the past 720 hour(s)).  Medical History: Past Medical History  Diagnosis Date  . CP (cerebral palsy), spastic, quadriplegic   . Osteoporosis   . Undescended testes   . Seasonal allergies   . IVH (intraventricular hemorrhage) 02[birth year]22-90    Grade IV  . Hip dislocation, bilateral   . Dysphagia   . Retinopathy of prematurity   . 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 Neuromuscular scoliosis   . Osteoporosis   . Complex partial seizures   . Generalized convulsive epilepsy without mention of intractable epilepsy   . Sinus bradycardia     HR drops to 38-40 while sleeping  . Blister of right heel     fluid filled; origin unknown  . Kidney stones     ?  Marland Kitchen Pneumonia      chronic  pneumonia ,respitory failure dx Augest 2014  . Aspiration pneumonia     "chronic" (04/12/2014)  . OSA treated with BiPAP     "since age 57"   . Anemia   . 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 GERD (gastroesophageal reflux disease)   . Epilepsy   . Spastic quadriplegia   . Neutropenia 07/03/2014    Medications:  See medication history  Assessment: 24 y.o. male with fever/PNA for empiric antibiotics  Goal of Therapy:  Vancomycin trough level 15-20 mcg/ml  Plan:  Vancomycin 1 g IV now, then 750 mg IV q12h Zosyn 3.375 g IV q8h   [name] 10/09/2014,7:48 AM

## 2014-10-09 NOTE — ED Notes (Signed)
RT note: Awaiting Mother to return to Hospital before administering Brovana/Pulmicort, is Primary Caregiver for pt., Dad remains @ bedside, RT to monitor.

## 2014-10-09 NOTE — ED Notes (Signed)
Daughter who is an Therapist, sports has already accessed port and she does not wish to have it de-accessed.  MD aware, spoke with our AD who is aware.  According to MD and the AD of this department, it is ok to be used.  An extension set has been added and blood cultures have been drawn from the port

## 2014-10-09 NOTE — ED Notes (Signed)
Calling pharmacy about clobazam.

## 2014-10-09 NOTE — ED Provider Notes (Signed)
CSN: [id number]     Arrival date & time 10/09/14  7124 History   First MD Initiated Contact with Patient 10/09/14 [phone]     Chief Complaint  Patient presents with  . Code Sepsis  . Fever     (Consider location/radiation/quality/duration/timing/severity/associated sxs/prior Treatment) HPI Comments: Here with cough for the past week. History of cerebral palsy, recurrent respiratory infections. Was diagnosed with right middle and right lower lobe pneumonia last week, had 2 rocephin injections on 2 consecutive days, then was switched to oral omnicef.  Patient began running a fever and coughing severely last night. Tmax of 105 at home. He received 2 units of blood 6 days ago for pancytopenia. Hx of pancytopenia, treated by Dr. Marin Olp.  Hx of cerebral palsy, well known to the hospital. Patient is a DNR, no chest compressions. Mom is amenable to pressors if necessary.  Mom accessed port at home, wants [place] to use it. She is a former Therapist, sports and knows how to access his port. This is a regular occurrence and she doesn't want [place] to change it out. Mom also placed a new condom cath on about 2 hours prior to arrival and wants [place] to leave it on.  Patient is a 24 y.o. male presenting with fever and cough. The history is provided by the patient.  Fever Max temp prior to arrival:  105 Temp source:  Oral Severity:  Severe Onset quality:  Sudden Timing:  Constant Progression:  Waxing and waning Chronicity:  Recurrent Relieved by:  Nothing Worsened by:  Nothing tried Associated symptoms: cough   Cough Cough characteristics:  Productive Sputum characteristics:  Yellow Severity:  Moderate Onset quality:  Gradual Duration:  5 days Timing:  Constant Progression:  Worsening Chronicity:  Recurrent Smoker: no   Context: upper respiratory infection   Associated symptoms: fever and shortness of breath     Past Medical History  Diagnosis Date  . CP (cerebral palsy), spastic, quadriplegic   . Osteoporosis    . Undescended testes   . Seasonal allergies   . IVH (intraventricular hemorrhage) 12-15-90    Grade IV  . Hip dislocation, bilateral   . Dysphagia   . Retinopathy of prematurity   . 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 Neuromuscular scoliosis   . Osteoporosis   . Complex partial seizures   . Generalized convulsive epilepsy without mention of intractable epilepsy   . Sinus bradycardia     HR drops to 38-40 while sleeping  . Blister of right heel     fluid filled; origin unknown  . Kidney stones     ?  Marland Kitchen Pneumonia      chronic pneumonia ,respitory failure dx Augest 2014  . Aspiration pneumonia     "chronic" (04/12/2014)  . OSA treated with BiPAP     "since age 24"   . Anemia   . 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 GERD (gastroesophageal reflux disease)   . Epilepsy   . Spastic quadriplegia   . Neutropenia 07/03/2014   Past Surgical History  Procedure Laterality Date  . Mole removal  "several B/T 2008-2010"    "from all over"  . Jejunostomy feeding tube  03/08/2013; ~ 09/2013; ~ 01/2014    "transgastric-jujunal feeding tube"  . Gastrostomy tube placement  11./02/1998       . Button change  12/15/2010    Procedure: BUTTON CHANGE;  Surgeon: Gatha Mayer, MD;  Location: [facility];  Service: Endoscopy;  Laterality: N/A;  . Peg placement  10/07/2011    Procedure: PERCUTANEOUS ENDOSCOPIC GASTROSTOMY (PEG) REPLACEMENT;  Surgeon: Lafayette Dragon, MD;  Location: [facility];  Service: Endoscopy;  Laterality: N/A;  Needs 18 F 2.5 button ordered-dl  . Inguinal hernia repair Bilateral 1992  . Retinopathy of prematurity surgery  1992  . Achilles tendon lengthening Bilateral 12/1998  . Tendon release  12/1998    Soft tissue releases  wrists and fingers [Other]  . Infusion pump implantation  07/25/2000; 2013    Intrathecal baclofen pump  . Peg placement N/A 06/05/2012    Procedure: PERCUTANEOUS ENDOSCOPIC GASTROSTOMY  (PEG) REPLACEMENT;  Surgeon: Lafayette Dragon, MD;  Location: [facility];  Service: Endoscopy;  Laterality: N/A;  button 21fr.2.5cm  . Strabismus surgery Bilateral 1993    "3 on right; 2 on left"  . Back surgery  ~ 2008    Harrington Rods in back needs to be log rolled  . Tendon repair Bilateral 09/05/2012    Procedure: LENGTHENING OF DIGITAL FLEXOR TENDONS BILTERAL HANDS;  Surgeon: Jolyn Nap, MD;  Location: [facility];  Service: Orthopedics;  Laterality: Bilateral;  . Peg placement N/A 09/13/2012    Procedure: PERCUTANEOUS ENDOSCOPIC GASTROSTOMY (PEG) REPLACEMENT;  Surgeon: Lafayette Dragon, MD;  Location: [facility];  Service: Endoscopy;  Laterality: N/A;  . Flexible sigmoidoscopy N/A 10/30/2012    Procedure: FLEXIBLE SIGMOIDOSCOPY;  Surgeon: Cleotis Nipper, MD;  Location: [facility];  Service: Endoscopy;  Laterality: N/A;  . Peg placement N/A 11/15/2012    Procedure: PERCUTANEOUS ENDOSCOPIC GASTROSTOMY (PEG) REPLACEMENT;  Surgeon: Jeryl Columbia, MD;  Location[facility];  Service: Endoscopy;  Laterality: N/A;  . Gastrostomy tube placement  11./02/1998  . Rectal biopsy  10/29/2012    S/P diarrhea from Vancomycin  . Portacath placement Right 11/25/2012    chest  . Eye surgery     Family History  Problem Relation Age of Onset  . Adopted: Yes   Social History  Substance Use Topics  . Smoking status: Never Smoker   . Smokeless tobacco: Never Used     Comment: never used tobacco  . Alcohol Use: No    Review of Systems  Constitutional: Positive for fever.  Respiratory: Positive for cough and shortness of breath.   All other systems reviewed and are negative.     Allergies  Depakote; Vimpat; Keppra; Adhesive; Amoxicillin-pot clavulanate; and Neulasta  Home Medications   Prior to Admission medications   Medication Sig Start Date End Date Taking? Authorizing Provider  acetaminophen (TYLENOL) 160 MG/5ML solution 480 mg by Gastric Tube route every 6 (six) hours as needed for  fever.    Historical Provider, MD  acetaminophen (TYLENOL) 80 MG suppository Place 480 mg rectally every 4 (four) hours as needed for fever.    Historical Provider, MD  albuterol (PROVENTIL HFA;VENTOLIN HFA) 108 (90 BASE) MCG/ACT inhaler Inhale 2 puffs into the lungs every 4 (four) hours as needed for shortness of breath.    Historical Provider, MD  albuterol (PROVENTIL) (2.5 MG/3ML) 0.083% nebulizer solution Take 2.5 mg by nebulization See admin instructions. Give 1 vial (2.5 mg) twice daily (8am and 8pm) and every 4 hours as needed for shortness of breath or wheezing 10/11/12   Elsie Stain, MD  Amino Acids-Protein Hydrolys (FEEDING SUPPLEMENT, PRO-STAT SUGAR FREE 64,) LIQD Take 30 mLs by mouth 2 (two) times daily. Patient taking differently: 30 mLs by Gastric Tube route 2 (two) times daily.  12p and 5p 06/27/14   Velvet Bathe, MD  baclofen (GABLOFEN) 40000 MCG/20ML SOLN by Intrathecal route continuous. 385.2 mcg in 24 hours 05/04/12   Jodi Geralds, MD  baclofen (LIORESAL) 10 MG tablet CRUSH AND GIVE 1 TABLET 3 TIMES PER DAY 09/23/14   Jodi Geralds, MD  bag balm OINT ointment Apply 1 application topically See admin instructions. Apply to sacral area with each diaper change    Historical Provider, MD  calcium carbonate, dosed in mg elemental calcium, 1250 MG/5ML 1,250 mg by Gastric Tube route 3 (three) times daily. 8am , 2pm, and 8pm    Historical Provider, MD  cefdinir (OMNICEF) 250 MG/5ML suspension Please take 5 mL  Via feeding tube twice a day for 10 days. 10/04/14   Volanda Napoleon, MD  cloBAZam (ONFI) 2.5 MG/ML solution Place 2.5 mL in gastrostomy tube 3 times daily Patient taking differently: 2.5 mg by Gastric Tube route 3 (three) times daily. 8am, 2pm, 8pm 05/17/14   Jodi Geralds, MD  DIASTAT ACUDIAL 20 MG GEL Place 12.5 mg rectally as needed (for seizure lasting 2 minutes or longer or repetitive seizures - no more than 1 dose in 12 hours (not given with nasal versed)).  04/01/14    Historical Provider, MD  dicyclomine (BENTYL) 10 MG/5ML syrup 10 mg by Gastric Tube route every 8 (eight) hours as needed (abdominal cramps). Do not exceed 5 days in one week    Historical Provider, MD  diphenoxylate-atropine (LOMOTIL) 2.5-0.025 MG/5ML liquid 10 mLs by Gastric Tube route every 6 (six) hours as needed for diarrhea or loose stools.     Historical Provider, MD  filgrastim (NEUPOGEN) 480 MCG/0.8ML SOSY injection Inject 0.8 mLs (480 mcg total) into the skin once. 09/19/14   Volanda Napoleon, MD  filgrastim (NEUPOGEN) 480 MCG/1.6ML injection Inject 1.6 mLs (480 mcg total) into the skin once a week. As directed by [name] 09/19/14   Volanda Napoleon, MD  fluconazole (DIFLUCAN) 40 MG/ML suspension Please 100 mg Per Tube daily for 6 days. Qty Suficient 07/26/14   Kelvin Cellar, MD  folic acid (FOLVITE) 1 MG tablet 1 mg by Gastric Tube route daily. 8:00am per G tube    Historical Provider, MD  furosemide (LASIX) 10 MG/ML solution 20 mg by Gastric Tube route daily as needed for fluid or edema. Maximum 3 times week    Historical Provider, MD  gabapentin (NEURONTIN) 250 MG/5ML solution Take 18 mL 3 times daily 08/01/14   Jodi Geralds, MD  ibuprofen (ADVIL,MOTRIN) 100 MG/5ML suspension 400 mg by Gastric Tube route every 6 (six) hours as needed for fever (pain).     Historical Provider, MD  ketoconazole (NIZORAL) 2 % cream Apply 1 application topically 2 (two) times daily as needed for irritation (yeast rash from antibiotics).     Historical Provider, MD  lansoprazole (PREVACID SOLUTAB) 30 MG disintegrating tablet 30 mg by Gastric Tube route 2 (two) times daily. 7:30am and 7:30pm 09/07/12   Lafayette Dragon, MD  metoCLOPramide (REGLAN) 5 MG/5ML solution Place 10 mLs (10 mg total) into feeding tube 4 (four) times daily -  before meals and at bedtime. 07/26/14   Kelvin Cellar, MD  midazolam (VERSED) 5 MG/ML injection Place 2 mLs (10 mg total) into the nose once. Draw up 58ml in 2 syringes. Remove blue  vial access device. Attach syringe to nasal atomizer for intranasal administration. Give 65ml in right nostril x 2 for seizures lasting 2 minutes or longer or  for repetitive seizures in a short period of time. Patient taking differently: Place 10 mg into the nose as needed (for seizure lasting 2 minutes or longer or repetitive seizures - no more than 1 dose in 12 hours (if unable to give diastat)). Draw up 65ml in 2 syringes. Remove blue vial access device. Attach syringe to nasal atomizer for intranasal administration. Give 72ml in right nostril x 2 for seizures lasting 2 minutes or longer or for repetitive seizures in a short period of time. 11/05/13   Rockwell Germany, NP  Multiple Vitamin (MULTIVITAMIN) LIQD 10 mLs by Gastric Tube route daily.    Historical Provider, MD  Multiple Vitamins-Minerals (ZINC PO) 5 mLs by Gastric Tube route daily. 244 mg / 5 mL zinc solution compounded by [facility]    Historical Provider, MD  mupirocin ointment (BACTROBAN) 2 % Apply 1 application topically as needed (to G-T site). Patient taking differently: Apply 1 application topically 3 (three) times daily as needed (for redness at the G-T site).  09/07/12   Lafayette Dragon, MD  Nutritional Supplements (TWOCAL HN) LIQD 237 mLs by Gastric Tube route See admin instructions. T.3 can at 46 cc hour x 12 hours - water 172 cc pre and post each and extra 172 bid.    Historical Provider, MD  OnabotulinumtoxinA (BOTOX IJ) Inject 300 Units as directed See admin instructions. Every 3 months    Historical Provider, MD  oseltamivir (TAMIFLU) 75 MG capsule Take 1 capsule (75 mg total) by mouth daily. Patient taking differently: Take 75 mg by mouth daily. Last dose 8-12- 09/03/14   Volanda Napoleon, MD  OXYGEN-HELIUM IN Inhale into the lungs. Oxygen PRN to keep O2 Sat at 90%    Historical Provider, MD  potassium chloride (KLOR-CON) 20 MEQ packet Give 20 meq daily except twice daily with lasix, given through J port 07/26/14   Kelvin Cellar,  MD  Pseudoephedrine HCl (SUDAFED CHILDRENS) 15 MG/5ML LIQD 30 mg by Gastric Tube route 3 (three) times daily. 6am, 2pm, 8pm    Historical Provider, MD  simethicone (MYLICON) 235 MG chewable tablet 125 mg by Gastric Tube route See admin instructions. 125mg  caplet added to each can of Two Cal 3 times daily    Historical Provider, MD  sodium phosphate (FLEET) enema Place 1 enema rectally daily as needed (gas buildup/ bloating).     Historical Provider, MD  sucralfate (CARAFATE) 1 GM/10ML suspension 1 g by Gastric Tube route 4 (four) times daily. 7am, 12pm, 5pm, 7pm Administer alone 30 minutes prior to other medications.    Historical Provider, MD  Vitamin Mixture (VITAMIN C) LIQD 300 mLs by Gastric Tube route daily.    Historical Provider, MD  Water For Irrigation, Sterile (FREE WATER) SOLN 172 mLs by Gastric Tube route 4 (four) times daily.     Historical Provider, MD  Zinc Oxide (BALMEX EX) Apply 1 application topically See admin instructions. Apply to sacral with each diaper change    Historical Provider, MD   BP 124/107 mmHg  Pulse 146  Temp(Src) 103.9 F (39.9 C) (Rectal)  Resp 41  SpO2 100% Physical Exam  Constitutional: He is oriented to person, place, and time. He appears well-nourished. He appears distressed.  HENT:  Head: Normocephalic and atraumatic.  Mouth/Throat: No oropharyngeal exudate.  Eyes: EOM are normal. Pupils are equal, round, and reactive to light.  Neck: Normal range of motion. Neck supple.  Cardiovascular: Normal rate and regular rhythm.  Exam reveals no friction rub.  No murmur heard. Pulmonary/Chest: He is in respiratory distress. He has decreased breath sounds (diffusely, worse in lower fields). He has wheezes (moderate, diffuse). He has no rales.  Abdominal: He exhibits no distension. There is no tenderness. There is no rebound.  Musculoskeletal: Normal range of motion. He exhibits no edema.  Neurological: He is alert and oriented to person, place, and time.   Skin: He is not diaphoretic.  Nursing note and vitals reviewed.   ED Course  Procedures (including critical care time) Labs Review Labs Reviewed  CULTURE, BLOOD (ROUTINE X 2)  CULTURE, BLOOD (ROUTINE X 2)  URINE CULTURE  COMPREHENSIVE METABOLIC PANEL  CBC WITH DIFFERENTIAL/PLATELET  URINALYSIS, ROUTINE W REFLEX MICROSCOPIC (NOT AT [facility])  I-STAT CG4 LACTIC ACID, ED    Imaging Review No results found. I have personally reviewed and evaluated these images and lab results as part of my medical decision-making.   EKG Interpretation   Date/Time:  Wednesday October 09 2014 06:58:02 EDT Ventricular Rate:  140 PR Interval:  114 QRS Duration: 62 QT Interval:  272 QTC Calculation: 415 R Axis:   101 Text Interpretation:  Sinus tachycardia Multiform ventricular premature  complexes Borderline right axis deviation Low voltage, precordial leads No  significant change since last tracing Confirmed by Mingo Amber  MD, Fries  ([id number]) on 10/09/2014 7:59:05 AM      MDM   Final diagnoses:  Sepsis, due to unspecified organism  Fever, unspecified fever cause    75M with hx of CP, MR, recurrent pneumonias, seizures presents with cough and fever. Known pneumonia from last week, is on oral omnicef after receiving 2 injections of Rocephin. Patient here with port accessed, Mom was [place] to use it and does not want [place] to change it out. Mom also placed new condom cath on patient. Febrile, tachycardic, tachypneic, hypoxic. Patient is not on oxygen at baseline. Review of prior CXR, patient had consolidation in RLL and RML.  Sepsis protocol initiated, given Vanc/Zosyn. He is DNR, but is amenable to pressors.  Sepsis - Repeat Assessment  Performed at:    0926  Vitals     Blood pressure 100/67, pulse 132, temperature 102.4 F (39.1 C), temperature source Rectal, resp. rate 28, weight 124 lb (56.246 kg), SpO2 100 %.  Heart:     Tachycardic  Lungs:    Wheezing  Capillary Refill:   <2  sec  Skin:     Normal Color   Lactate improving. Admitted to [facility]. CXR clear, urine clear, unknown source at this time.      Evelina Bucy, MD 10/09/14 [phone]

## 2014-10-09 NOTE — H&P (Signed)
[facility] History and Physical                                                                                    Jeffery [name], is a 24 y.o. male  MRN: [id number]   DOB - [birth year]  Admit Date - 10/09/2014  Outpatient Primary MD for the patient is [name], Monia Sabal, MD  Referring Physician:  Dr. Mingo Amber  Chief Complaint:   Chief Complaint  Patient presents with  . Code Sepsis  . Fever     HPI  Ali Mohl  is a 24 y.o. male, with cerebral palsy, spastic quadriplegia, generalized convulsive epilepsy, pancytopenia, obstructive sleep apnea on BiPAP at night, and recurrent aspiration pneumonia (PEG/J tube in place).  His stepfather, Mr. Hulen Skains gives the history as the patient is nonverbal. He reports that normally Mr. Feger is not on oxygen at home but over the past week he has had difficulty breathing and using 4-6 L of oxygen.  Chest x-ray was done on September 1 which showed pneumonia in the right middle and lower lobes. He was treated by Dr. Marin Olp with Rocephin IM 2 days and then started on Cefdinir.  He has not improved. This morning he had increased work of breathing and is coughing up thick secretions. His temperature was noted to be 105 rectally and he was brought to the emergency department.    In the ER he is found to be septic his rectal temperature is 13.9, pulse rate 146, white count is 1 at baseline but today is elevated to 5.1. BUN is 50. Chest x-ray is improved compared to 9/1. Suspect acute aspiration.  Will admit to step down.  Review of Systems   Patient is nonverbal and unable to give a review of systems  Past Medical History  Past Medical History  Diagnosis Date  . CP (cerebral palsy), spastic, quadriplegic   . Osteoporosis   . Undescended testes   . Seasonal allergies   . IVH (intraventricular hemorrhage) 1990/04/15    Grade IV  . Hip dislocation, bilateral   . Dysphagia   . Retinopathy of prematurity   . 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  Neuromuscular scoliosis   . Osteoporosis   . Complex partial seizures   . Generalized convulsive epilepsy without mention of intractable epilepsy   . Sinus bradycardia     HR drops to 38-40 while sleeping  . Blister of right heel     fluid filled; origin unknown  . Kidney stones     ?  Marland Kitchen Pneumonia      chronic pneumonia ,respitory failure dx Augest 2014  . Aspiration pneumonia     "chronic" (04/12/2014)  . OSA treated with BiPAP     "since age 59"   . Anemia   . 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 GERD (gastroesophageal reflux disease)   . Epilepsy   . Spastic quadriplegia   . Neutropenia 07/03/2014    Past Surgical History  Procedure Laterality Date  . Mole removal  "several B/T 2008-2010"    "from all over"  .  Jejunostomy feeding tube  03/08/2013; ~ 09/2013; ~ 01/2014    "transgastric-jujunal feeding tube"  . Gastrostomy tube placement  11./02/1998       . Button change  12/15/2010    Procedure: BUTTON CHANGE;  Surgeon: Gatha Mayer, MD;  Location: [facility];  Service: Endoscopy;  Laterality: N/A;  . Peg placement  10/07/2011    Procedure: PERCUTANEOUS ENDOSCOPIC GASTROSTOMY (PEG) REPLACEMENT;  Surgeon: Lafayette Dragon, MD;  Location: [facility];  Service: Endoscopy;  Laterality: N/A;  Needs 18 F 2.5 button ordered-dl  . Inguinal hernia repair Bilateral 1992  . Retinopathy of prematurity surgery  1992  . Achilles tendon lengthening Bilateral 12/1998  . Tendon release  12/1998    Soft tissue releases  wrists and fingers [Other]  . Infusion pump implantation  07/25/2000; 2013    Intrathecal baclofen pump  . Peg placement N/A 06/05/2012    Procedure: PERCUTANEOUS ENDOSCOPIC GASTROSTOMY (PEG) REPLACEMENT;  Surgeon: Lafayette Dragon, MD;  Location: [facility];  Service: Endoscopy;  Laterality: N/A;  button 20fr.2.5cm  . Strabismus surgery Bilateral 1993    "3 on right; 2 on left"  . Back surgery  ~ 2008     Harrington Rods in back needs to be log rolled  . Tendon repair Bilateral 09/05/2012    Procedure: LENGTHENING OF DIGITAL FLEXOR TENDONS BILTERAL HANDS;  Surgeon: Jolyn Nap, MD;  Location: [place];  Service: Orthopedics;  Laterality: Bilateral;  . Peg placement N/A 09/13/2012    Procedure: PERCUTANEOUS ENDOSCOPIC GASTROSTOMY (PEG) REPLACEMENT;  Surgeon: Lafayette Dragon, MD;  Location: [facility];  Service: Endoscopy;  Laterality: N/A;  . Flexible sigmoidoscopy N/A 10/30/2012    Procedure: FLEXIBLE SIGMOIDOSCOPY;  Surgeon: Cleotis Nipper, MD;  Location: [facility];  Service: Endoscopy;  Laterality: N/A;  . Peg placement N/A 11/15/2012    Procedure: PERCUTANEOUS ENDOSCOPIC GASTROSTOMY (PEG) REPLACEMENT;  Surgeon: Jeryl Columbia, MD;  Location: [facility];  Service: Endoscopy;  Laterality: N/A;  . Gastrostomy tube placement  11./02/1998  . Rectal biopsy  10/29/2012    S/P diarrhea from Vancomycin  . Portacath placement Right 11/25/2012    chest  . Eye surgery        Social History Social History  Substance Use Topics  . Smoking status: Never Smoker   . Smokeless tobacco: Never Used     Comment: never used tobacco  . Alcohol Use: No   lives at home with his mother who is an Therapist, sports. He is completely dependent with ADLs.   Family History Family History  Problem Relation Age of Onset  . Adopted: Yes    Prior to Admission medications   Medication Sig Start Date End Date Taking? Authorizing Provider  acetaminophen (TYLENOL) 160 MG/5ML solution 480 mg by Gastric Tube route every 6 (six) hours as needed for fever.    Historical Provider, MD  acetaminophen (TYLENOL) 80 MG suppository Place 480 mg rectally every 4 (four) hours as needed for fever.    Historical Provider, MD  albuterol (PROVENTIL HFA;VENTOLIN HFA) 108 (90 BASE) MCG/ACT inhaler Inhale 2 puffs into the lungs every 4 (four) hours as needed for shortness of breath.    Historical Provider, MD  albuterol (PROVENTIL) (2.5 MG/3ML)  0.083% nebulizer solution Take 2.5 mg by nebulization See admin instructions. Give 1 vial (2.5 mg) twice daily (8am and 8pm) and every 4 hours as needed for shortness of breath or wheezing 10/11/12   Elsie Stain, MD  Amino Acids-Protein Hydrolys (FEEDING SUPPLEMENT, PRO-STAT  SUGAR FREE 64,) LIQD Take 30 mLs by mouth 2 (two) times daily. Patient taking differently: 30 mLs by Gastric Tube route 2 (two) times daily. 12p and 5p 06/27/14   Velvet Bathe, MD  baclofen (GABLOFEN) 40000 MCG/20ML SOLN by Intrathecal route continuous. 385.2 mcg in 24 hours 05/04/12   Jodi Geralds, MD  baclofen (LIORESAL) 10 MG tablet CRUSH AND GIVE 1 TABLET 3 TIMES PER DAY 09/23/14   Jodi Geralds, MD  bag balm OINT ointment Apply 1 application topically See admin instructions. Apply to sacral area with each diaper change    Historical Provider, MD  calcium carbonate, dosed in mg elemental calcium, 1250 MG/5ML 1,250 mg by Gastric Tube route 3 (three) times daily. 8am , 2pm, and 8pm    Historical Provider, MD  cefdinir (OMNICEF) 250 MG/5ML suspension Please take 5 mL  Via feeding tube twice a day for 10 days. 10/04/14   Volanda Napoleon, MD  cloBAZam (ONFI) 2.5 MG/ML solution Place 2.5 mL in gastrostomy tube 3 times daily Patient taking differently: 2.5 mg by Gastric Tube route 3 (three) times daily. 8am, 2pm, 8pm 05/17/14   Jodi Geralds, MD  DIASTAT ACUDIAL 20 MG GEL Place 12.5 mg rectally as needed (for seizure lasting 2 minutes or longer or repetitive seizures - no more than 1 dose in 12 hours (not given with nasal versed)).  04/01/14   Historical Provider, MD  dicyclomine (BENTYL) 10 MG/5ML syrup 10 mg by Gastric Tube route every 8 (eight) hours as needed (abdominal cramps). Do not exceed 5 days in one week    Historical Provider, MD  diphenoxylate-atropine (LOMOTIL) 2.5-0.025 MG/5ML liquid 10 mLs by Gastric Tube route every 6 (six) hours as needed for diarrhea or loose stools.     Historical Provider, MD   filgrastim (NEUPOGEN) 480 MCG/0.8ML SOSY injection Inject 0.8 mLs (480 mcg total) into the skin once. 09/19/14   Volanda Napoleon, MD  filgrastim (NEUPOGEN) 480 MCG/1.6ML injection Inject 1.6 mLs (480 mcg total) into the skin once a week. As directed by [name] 09/19/14   Volanda Napoleon, MD  fluconazole (DIFLUCAN) 40 MG/ML suspension Please 100 mg Per Tube daily for 6 days. Qty Suficient 07/26/14   Kelvin Cellar, MD  folic acid (FOLVITE) 1 MG tablet 1 mg by Gastric Tube route daily. 8:00am per G tube    Historical Provider, MD  furosemide (LASIX) 10 MG/ML solution 20 mg by Gastric Tube route daily as needed for fluid or edema. Maximum 3 times week    Historical Provider, MD  gabapentin (NEURONTIN) 250 MG/5ML solution Take 18 mL 3 times daily 08/01/14   Jodi Geralds, MD  ibuprofen (ADVIL,MOTRIN) 100 MG/5ML suspension 400 mg by Gastric Tube route every 6 (six) hours as needed for fever (pain).     Historical Provider, MD  ketoconazole (NIZORAL) 2 % cream Apply 1 application topically 2 (two) times daily as needed for irritation (yeast rash from antibiotics).     Historical Provider, MD  lansoprazole (PREVACID SOLUTAB) 30 MG disintegrating tablet 30 mg by Gastric Tube route 2 (two) times daily. 7:30am and 7:30pm 09/07/12   Lafayette Dragon, MD  metoCLOPramide (REGLAN) 5 MG/5ML solution Place 10 mLs (10 mg total) into feeding tube 4 (four) times daily -  before meals and at bedtime. 07/26/14   Kelvin Cellar, MD  midazolam (VERSED) 5 MG/ML injection Place 2 mLs (10 mg total) into the nose once. Draw up 3ml in 2 syringes. Remove blue vial  access device. Attach syringe to nasal atomizer for intranasal administration. Give 47ml in right nostril x 2 for seizures lasting 2 minutes or longer or for repetitive seizures in a short period of time. Patient taking differently: Place 10 mg into the nose as needed (for seizure lasting 2 minutes or longer or repetitive seizures - no more than 1 dose in 12 hours (if  unable to give diastat)). Draw up 41ml in 2 syringes. Remove blue vial access device. Attach syringe to nasal atomizer for intranasal administration. Give 38ml in right nostril x 2 for seizures lasting 2 minutes or longer or for repetitive seizures in a short period of time. 11/05/13   Rockwell Germany, NP  Multiple Vitamin (MULTIVITAMIN) LIQD 10 mLs by Gastric Tube route daily.    Historical Provider, MD  Multiple Vitamins-Minerals (ZINC PO) 5 mLs by Gastric Tube route daily. 244 mg / 5 mL zinc solution compounded by [facility]    Historical Provider, MD  mupirocin ointment (BACTROBAN) 2 % Apply 1 application topically as needed (to G-T site). Patient taking differently: Apply 1 application topically 3 (three) times daily as needed (for redness at the G-T site).  09/07/12   Lafayette Dragon, MD  Nutritional Supplements (TWOCAL HN) LIQD 237 mLs by Gastric Tube route See admin instructions. T.3 can at 46 cc hour x 12 hours - water 172 cc pre and post each and extra 172 bid.    Historical Provider, MD  OnabotulinumtoxinA (BOTOX IJ) Inject 300 Units as directed See admin instructions. Every 3 months    Historical Provider, MD  oseltamivir (TAMIFLU) 75 MG capsule Take 1 capsule (75 mg total) by mouth daily. Patient taking differently: Take 75 mg by mouth daily. Last dose 8-12- 09/03/14   Volanda Napoleon, MD  OXYGEN-HELIUM IN Inhale into the lungs. Oxygen PRN to keep O2 Sat at 90%    Historical Provider, MD  potassium chloride (KLOR-CON) 20 MEQ packet Give 20 meq daily except twice daily with lasix, given through J port 07/26/14   Kelvin Cellar, MD  Pseudoephedrine HCl (SUDAFED CHILDRENS) 15 MG/5ML LIQD 30 mg by Gastric Tube route 3 (three) times daily. 6am, 2pm, 8pm    Historical Provider, MD  simethicone (MYLICON) 151 MG chewable tablet 125 mg by Gastric Tube route See admin instructions. 125mg  caplet added to each can of Two Cal 3 times daily    Historical Provider, MD  sodium phosphate (FLEET) enema Place 1  enema rectally daily as needed (gas buildup/ bloating).     Historical Provider, MD  sucralfate (CARAFATE) 1 GM/10ML suspension 1 g by Gastric Tube route 4 (four) times daily. 7am, 12pm, 5pm, 7pm Administer alone 30 minutes prior to other medications.    Historical Provider, MD  Vitamin Mixture (VITAMIN C) LIQD 300 mLs by Gastric Tube route daily.    Historical Provider, MD  Water For Irrigation, Sterile (FREE WATER) SOLN 172 mLs by Gastric Tube route 4 (four) times daily.     Historical Provider, MD  Zinc Oxide (BALMEX EX) Apply 1 application topically See admin instructions. Apply to sacral with each diaper change    Historical Provider, MD    Allergies  Allergen Reactions  . Depakote [Divalproex Sodium]     Causes pancreatitis   . Vimpat [Lacosamide] Rash  . Keppra [Levetiracetam] Other (See Comments)    Bone marrow suppression  . Adhesive [Tape] Other (See Comments)    Rips skin off (paper tape is ok)  . Amoxicillin-Pot Clavulanate Rash  If given with fluconazole rash is not as severe or does not appear at all  . Neulasta [Pegfilgrastim] Other (See Comments)    Fever, tachycardia    Physical Exam  Vitals  Blood pressure 123/74, pulse 124, temperature 102.4 F (39.1 C), temperature source Rectal, resp. rate 28, weight 56.246 kg (124 lb), SpO2 98 %.   General: Small, non verbal, male, lying in bed with increased work of breathing.  Psych:  Patient is at baseline. He is awake, nonverbal, does not respond to commands  Neuro:   No F.N deficits, ALL C.Nerves Intact, Strength 5/5 all 4 extremities, Sensation intact all 4 extremities.  ENT:  Currently on venti mask, but cough up thick secretions.  MMM  Neck:  Supple, No lymphadenopathy appreciated  Respiratory:  Mild increased work of breathing, positive for crackles, snoring. No frank wheezing.  Cardiac:  Tachycardic, No Murmurs, no LE edema noted, no JVD.    Abdomen:  Positive bowel sounds, Soft, Non tender, Non  distended,  No masses appreciated  Skin:  No Cyanosis, Normal Skin Turgor, No Skin Rash or Bruise.    Data Review  CBC  Recent Labs Lab 10/03/14 1200 10/09/14 0737  WBC 1.9* 5.1  HGB 8.3* 12.4*  HCT 27.3* 41.0  PLT 155 Large platelets present 171  MCV 112* 108.2*  MCH 34.2* 32.7  MCHC 30.4* 30.2  RDW 18.7* 18.5*  LYMPHSABS 0.4* 0.4*  MONOABS  --  0.8  EOSABS 0.0 0.0  BASOSABS 0.0 0.0    Chemistries   Recent Labs Lab 10/03/14 1200 10/09/14 0737  NA 142 145  K 4.7 4.0  CL 100 104  CO2 31 31  GLUCOSE 104 114*  BUN 27* 50*  CREATININE 0.7 0.99  CALCIUM 9.4 8.4*  AST 40* 123*  ALT 71* 148*  ALKPHOS 138* 140*  BILITOT 0.80 0.4    Urinalysis    Component Value Date/Time   COLORURINE YELLOW 10/09/2014 0741   APPEARANCEUR HAZY* 10/09/2014 0741   LABSPEC 1.025 10/09/2014 0741   PHURINE 6.0 10/09/2014 0741   GLUCOSEU NEGATIVE 10/09/2014 0741   HGBUR LARGE* 10/09/2014 0741   BILIRUBINUR NEGATIVE 10/09/2014 0741   KETONESUR NEGATIVE 10/09/2014 0741   PROTEINUR >300* 10/09/2014 0741   UROBILINOGEN 0.2 10/09/2014 0741   NITRITE NEGATIVE 10/09/2014 0741   LEUKOCYTESUR NEGATIVE 10/09/2014 0741    Imaging results:   Dg Chest 2 View  10/03/2014   CLINICAL DATA:  Productive cough with shortness of breath and tachycardia; fever  EXAM: CHEST  2 VIEW  COMPARISON:  July 25, 2014  FINDINGS: There is airspace consolidation involving portions of the right middle and lower lobes. Left lung is clear. Heart size is normal. Port-A-Cath tip is in the cavoatrial junction region. There is extensive postoperative change in the thoracic and visualized lumbar spine.  IMPRESSION: Airspace consolidation involving portions of the right middle and lower lobes. Left lung clear. Cardiac silhouette within normal limits.   Electronically Signed   By: Lowella Grip III M.D.   On: 10/03/2014 12:32   Dg Chest Port 1 View  10/09/2014   CLINICAL DATA:  Shortness of breath, cough.  EXAM:  PORTABLE CHEST - 1 VIEW  COMPARISON:  October 03, 2014.  FINDINGS: The heart size and mediastinal contours are within normal limits. Right-sided Port-A-Cath is unchanged in position with distal tip overlying expected position of the SVC. Right lung opacities noted on prior exam have resolved. Probable minimal subsegmental atelectasis seen in left midlung. Harrington rods are seen  in the thoracic and lumbar spine which are unchanged. Stable elevated left hemidiaphragm is noted. The visualized skeletal structures are unremarkable.  IMPRESSION: Right lung opacities noted on prior exam have resolved. Minimal subsegmental atelectasis is noted in left midlung.   Electronically Signed   By: Marijo Conception, M.D.   On: 10/09/2014 08:07    EKG shows sinus tach   Assessment & Plan  Principal Problem:   Sepsis Active Problems:   Dysphagia requring chronic J tube feeds   Generalized convulsive epilepsy   Aspiration pneumonia   Acute on chronic respiratory failure with hypoxia   OSA (obstructive sleep apnea)   Cerebral palsy, quadriplegic   Acquired pancytopenia   Sepsis due to pneumonia   Severe sepsis   HCAP (healthcare-associated pneumonia)   Sinus tachycardia   Elevated LFTs   Severe Sepsis Rectal temperature 105, pulse rate 120-140s, Suspect patient acutely aspirated in the setting of on-going HCAP or HCAP may not have responded to treatment over the past week with Rocephin and Cefdinir. Lactic acid was 3.7 initially and has begun to decrease with IV fluids. Blood cultures, urine cultures, sputum cultures obtained. Patient started on Vanco and Zosyn. (Per pharmacy he has done well on Zosyn in the past despite allergy noted).  HCAP with acute hypoxic respiratory failure Patient was diagnosed with right middle and lower lobe pneumonia on 9/1 view chest x-ray. He received 2 days of IM Rocephin and then was started on 10 days of Cefdinir by Dr. Marin Olp He is not normally on oxygen at home but  has required oxygen this week and is currently on 6 L in the ER. Checking Blood cultures, urine cultures, sputum cultures, urine for Legionella and strep pneumo. Vanco/Zosyn per pharmacy Will likely need nasotracheal suctioning as he has a history of mucous clots. BPAP PRN.  Chest Vibrator, Mucinex per tubeRepeat cxr in the morning 9/8. Patient is a known aspiration risk.  Will ask nutrition to start very slow continuous PEG feeds and check residuals.  Dysphagia Known aspiration risk.  Is NPO, has bolus tube feeds at home. Mother has very specific way of giving meds and feeding in order to prevent complications. Will ask nutrition to start very slow continuous PEG feeds/water flushes and check residuals  Generalized Convulsive Epilepsy Per step father, no recent history of seizures. Continue ONFI,  Diastat Acudial 20 mg Gel and midazolam PRN Seizure. Will continue gabapentin solution.  Cerebral Palsy - Spastic Quadriplegic Patient does not speak, Peg/J in place.  Non ambulatory.  Baclofen pump in right abdomin. Botox inj q 3 mos.  OSA Continue BiPap QHS per respiratory.  Mild Transaminitis LFTs were wnl 1 week ago.  Transaminases may be elevated due to sepsis.  Does not have any obvious drug interactions that would cause this.  Will monitor and recheck LFTs in am.  If worse would consider ultrasound.  Acquired Pancytopenia Treated by Dr. Marin Olp who was notified of this admission via EPIC.  Patient is on Neupogen which he receives each Thursday.  He received 2 units of PRBCs last week for a low HGb 8.3.  His Hgb is 12.4 today.  Social Patient's daily medical maintenance is complex. His mother is an Therapist, sports who is intimately familiar with his care. Please closely involve her with his medical plan of care as she has strong, well educated opinions that need to be considered.     Consultants Called:  None, but would have a low threshold to consult pulmonary if he decompensates.  Family  Communication:   Step father, Mr. Hulen Skains at bedside  Code Status:  DNR/DNI  Condition:  Critically ill  Potential Disposition: to home when improved.  Uncertain when this will be.  Time spent in minutes : [address]  PA-C on 10/09/2014 at 10:27 AM Between 7am to 7pm - Pager - [phone] After 7pm go to [email or web address] - password [email or web address] And look for the night coverage person covering me after hours  [facility]

## 2014-10-09 NOTE — ED Notes (Signed)
CBG 102 

## 2014-10-09 NOTE — ED Notes (Signed)
CBG 91 

## 2014-10-09 NOTE — ED Notes (Signed)
Phlebotomy at bedside obtaining second set of cultures.

## 2014-10-09 NOTE — Progress Notes (Signed)
   10/09/14 0800  Clinical Encounter Type  Visited With Patient and family together  Visit Type Critical Care;ED;Follow-up;Psychological support;Spiritual support  Referral From Nurse;Family  Spiritual Encounters  Spiritual Needs Prayer;Emotional  Stress Factors  Family Stress Factors Major life changes;Health changes;Exhausted  [place] paged to ED to talk with family; per RN diagnosis concerns for family; pt has multiple admissions and requests [facility] for bed; [place] aided in making RN on 3S aware of probable admit; pt seemed agitated and in more distress than last [facility] visit; Mom and Dad extremely concerned; Mom is Therapist, sports.

## 2014-10-09 NOTE — ED Notes (Signed)
  CBG 131  

## 2014-10-09 NOTE — ED Notes (Signed)
Admitting at bedside 

## 2014-10-09 NOTE — Progress Notes (Signed)
Spoke with patients father regarding chest PT. Father wishes to hold off on CPT at this time because son is not rested and uncomfortable. Will speak to the mother, also, if she comes in tonight.

## 2014-10-09 NOTE — Progress Notes (Signed)
Initial Nutrition Assessment  DOCUMENTATION CODES:   Not applicable  INTERVENTION:    Resume home TF at a low rate, Two Cal HN at 20 ml/h, increase by 10 ml every 4 hours to goal rate of 46 ml/h x 12 hours via J-tube for a total of 2.3 cans of Two Cal HN per day.  Parents to bring in TF formula from home.  Promod (from home) or Prostat 30 ml BID.  180 ml free water flushes 6 times per day.   NUTRITION DIAGNOSIS:   Inadequate oral intake related to inability to eat as evidenced by NPO status.  GOAL:   Patient will meet greater than or equal to 90% of their needs  MONITOR:   Labs, Weight trends, TF tolerance  REASON FOR ASSESSMENT:   Consult Enteral/tube feeding initiation and management  ASSESSMENT:   24 y.o. male, with cerebral palsy, spastic quadriplegia, generalized convulsive epilepsy, pancytopenia, obstructive sleep apnea on BiPAP at night, and recurrent aspiration pneumonia (PEG/J tube in place).Admitted with suspected acute aspiration event and sepsis.   Spoke with Imogene Burn, PA regarding patient. RD to order TF and free water flushes to meet patient's needs. Given recent aspiration, she recommended resuming feedings at a low rate.  Patient familiar from previous admissions. At home, he receives Two Cal HN at 46 ml/h x 12 hours via J-port of G-J tube for a total of 2.3 cans per day. Also recieves Promod liquid protein 30 ml BID and 180 ml free water flushes 6 times per day. This provides ~1300 kcals, 66 gm protein, 1462 ml free water total per day.   Spoke with father who confirmed above home TF regimen.   Diet Order:  Diet NPO time specified  Skin:  Reviewed, no issues  Last BM:  PTA  Height:   Ht Readings from Last 1 Encounters:  09/12/14 4\' 4"  (1.321 m)    Weight:   Wt Readings from Last 1 Encounters:  10/09/14 124 lb (56.246 kg)    BMI:  Body mass index is 32.23 kg/(m^2).  Estimated Nutritional Needs:   Kcal:  1250-1450  Protein:   65-80 gm  Fluid:  1.5 L  EDUCATION NEEDS:   No education needs identified at this time  Molli Barrows, [place], [place], [place] Pager [phone] After Hours Pager [phone]

## 2014-10-10 ENCOUNTER — Inpatient Hospital Stay (HOSPITAL_COMMUNITY): Payer: Medicaid Other

## 2014-10-10 DIAGNOSIS — G809 Cerebral palsy, unspecified: Secondary | ICD-10-CM

## 2014-10-10 DIAGNOSIS — D61818 Other pancytopenia: Secondary | ICD-10-CM | POA: Diagnosis present

## 2014-10-10 DIAGNOSIS — R532 Functional quadriplegia: Secondary | ICD-10-CM | POA: Diagnosis present

## 2014-10-10 DIAGNOSIS — J189 Pneumonia, unspecified organism: Secondary | ICD-10-CM

## 2014-10-10 DIAGNOSIS — A419 Sepsis, unspecified organism: Principal | ICD-10-CM

## 2014-10-10 DIAGNOSIS — R740 Nonspecific elevation of levels of transaminase and lactic acid dehydrogenase [LDH]: Secondary | ICD-10-CM

## 2014-10-10 DIAGNOSIS — R569 Unspecified convulsions: Secondary | ICD-10-CM | POA: Diagnosis present

## 2014-10-10 DIAGNOSIS — J690 Pneumonitis due to inhalation of food and vomit: Secondary | ICD-10-CM | POA: Diagnosis present

## 2014-10-10 LAB — BASIC METABOLIC PANEL
Anion gap: 3 — ABNORMAL LOW (ref 5–15)
BUN: 27 mg/dL — AB (ref 6–20)
CHLORIDE: 111 mmol/L (ref 101–111)
CO2: 30 mmol/L (ref 22–32)
CREATININE: 0.78 mg/dL (ref 0.61–1.24)
Calcium: 7.9 mg/dL — ABNORMAL LOW (ref 8.9–10.3)
GFR calc Af Amer: >60 mL/min (ref >60)
GFR calc non Af Amer: >60 mL/min (ref >60)
GLUCOSE: 99 mg/dL (ref 65–99)
POTASSIUM: 3.6 mmol/L (ref 3.5–5.1)
SODIUM: 144 mmol/L (ref 135–145)

## 2014-10-10 LAB — COMPREHENSIVE METABOLIC PANEL
ALT: 114 U/L — AB (ref 17–63)
ANION GAP: 9 (ref 5–15)
AST: 68 U/L — ABNORMAL HIGH (ref 15–41)
Albumin: 2 g/dL — ABNORMAL LOW (ref 3.5–5.0)
Alkaline Phosphatase: 106 U/L (ref 38–126)
BUN: 38 mg/dL — ABNORMAL HIGH (ref 6–20)
CHLORIDE: 104 mmol/L (ref 101–111)
CO2: 29 mmol/L (ref 22–32)
CREATININE: 1.01 mg/dL (ref 0.61–1.24)
Calcium: 8 mg/dL — ABNORMAL LOW (ref 8.9–10.3)
Glucose, Bld: 115 mg/dL — ABNORMAL HIGH (ref 65–99)
Potassium: 3.3 mmol/L — ABNORMAL LOW (ref 3.5–5.1)
SODIUM: 142 mmol/L (ref 135–145)
Total Bilirubin: 0.8 mg/dL (ref 0.3–1.2)
Total Protein: 4.8 g/dL — ABNORMAL LOW (ref 6.5–8.1)

## 2014-10-10 LAB — CBC WITH DIFFERENTIAL/PLATELET
BASOS ABS: 0 10*3/uL (ref 0.0–0.1)
Basophils Relative: 2 % — ABNORMAL HIGH (ref 0–1)
EOS ABS: 0 10*3/uL (ref 0.0–0.7)
Eosinophils Relative: 0 % (ref 0–5)
HCT: 28.2 % — ABNORMAL LOW (ref 39.0–52.0)
HEMOGLOBIN: 8.6 g/dL — AB (ref 13.0–17.0)
LYMPHS ABS: 0.3 10*3/uL — AB (ref 0.7–4.0)
LYMPHS PCT: 26 % (ref 12–46)
MCH: 32.6 pg (ref 26.0–34.0)
MCHC: 30.5 g/dL (ref 30.0–36.0)
MCV: 106.8 fL — ABNORMAL HIGH (ref 78.0–100.0)
MONOS PCT: 18 % — AB (ref 3–12)
Monocytes Absolute: 0.2 10*3/uL (ref 0.1–1.0)
NEUTROS ABS: 0.8 10*3/uL — AB (ref 1.7–7.7)
Neutrophils Relative %: 54 % (ref 43–77)
Platelets: 130 10*3/uL — ABNORMAL LOW (ref 150–400)
RBC: 2.64 MIL/uL — AB (ref 4.22–5.81)
RDW: 17.6 % — AB (ref 11.5–15.5)
WBC: 1.3 10*3/uL — AB (ref 4.0–10.5)

## 2014-10-10 LAB — GLUCOSE, CAPILLARY
GLUCOSE-CAPILLARY: 101 mg/dL — AB (ref 65–99)
GLUCOSE-CAPILLARY: 112 mg/dL — AB (ref 65–99)
GLUCOSE-CAPILLARY: 121 mg/dL — AB (ref 65–99)
GLUCOSE-CAPILLARY: 136 mg/dL — AB (ref 65–99)
Glucose-Capillary: 112 mg/dL — ABNORMAL HIGH (ref 65–99)
Glucose-Capillary: 127 mg/dL — ABNORMAL HIGH (ref 65–99)
Glucose-Capillary: 121 mg/dL — ABNORMAL HIGH (ref 65–99)

## 2014-10-10 LAB — MAGNESIUM
Magnesium: 2.2 mg/dL (ref 1.7–2.4)
Magnesium: 2.3 mg/dL (ref 1.7–2.4)

## 2014-10-10 LAB — URINE CULTURE

## 2014-10-10 LAB — LACTIC ACID, PLASMA
LACTIC ACID, VENOUS: 3.4 mmol/L — AB (ref 0.5–2.0)
Lactic Acid, Venous: 1.4 mmol/L (ref 0.5–2.0)

## 2014-10-10 LAB — LEGIONELLA ANTIGEN, URINE

## 2014-10-10 MED ORDER — FILGRASTIM 480 MCG/1.6ML IJ SOLN
480.0000 ug | Freq: Every day | INTRAMUSCULAR | Status: DC
  Administered 2014-10-10 – 2014-10-14 (×5): 480 ug via SUBCUTANEOUS
  Filled 2014-10-10 (×6): qty 1.6

## 2014-10-10 MED ORDER — LORAZEPAM 2 MG/ML IJ SOLN: 1.0000 mg | INTRAMUSCULAR | Status: DC | PRN

## 2014-10-10 MED ORDER — DIAZEPAM 10 MG RE GEL
10.0000 mg | RECTAL | Status: DC | PRN
  Administered 2014-10-10: 10 mg via RECTAL
  Filled 2014-10-10: qty 10

## 2014-10-10 MED ORDER — DEXTROSE-NACL 5-0.45 % IV SOLN
INTRAVENOUS | Status: DC
  Administered 2014-10-10 – 2014-10-13 (×7): via INTRAVENOUS

## 2014-10-10 MED ORDER — SODIUM CHLORIDE 0.9 % IV BOLUS (SEPSIS)
1000.0000 mL | Freq: Once | INTRAVENOUS | Status: AC
  Administered 2014-10-10: 1000 mL via INTRAVENOUS

## 2014-10-10 MED ORDER — SODIUM CHLORIDE 0.9 % IV SOLN: 75.0000 mL/h | INTRAVENOUS | Status: DC

## 2014-10-10 MED ORDER — POTASSIUM CHLORIDE 10 MEQ/100ML IV SOLN
10.0000 meq | INTRAVENOUS | Status: AC
  Administered 2014-10-10 (×4): 10 meq via INTRAVENOUS
  Filled 2014-10-10 (×2): qty 100

## 2014-10-10 MED ORDER — DIAZEPAM 2.5 MG RE GEL: 12.5000 mg | RECTAL | Status: DC | PRN

## 2014-10-10 MED ORDER — FOLIC ACID 5 MG/ML IJ SOLN
1.0000 mg | Freq: Every day | INTRAMUSCULAR | Status: DC
  Administered 2014-10-11 – 2014-10-20 (×9): 1 mg via INTRAVENOUS
  Filled 2014-10-10 (×11): qty 0.2

## 2014-10-10 MED ORDER — THIAMINE HCL 100 MG/ML IJ SOLN
100.0000 mg | Freq: Every day | INTRAMUSCULAR | Status: DC
  Administered 2014-10-11 – 2014-10-20 (×11): 100 mg via INTRAVENOUS
  Filled 2014-10-10 (×12): qty 1

## 2014-10-10 MED ORDER — DIAZEPAM 2.5 MG RE GEL
2.5000 mg | RECTAL | Status: DC | PRN
  Administered 2014-10-10: 2.5 mg via RECTAL
  Filled 2014-10-10: qty 2.5

## 2014-10-10 MED ORDER — DIPHENHYDRAMINE HCL 50 MG/ML IJ SOLN
25.0000 mg | Freq: Every day | INTRAMUSCULAR | Status: DC
  Administered 2014-10-10 – 2014-10-18 (×6): 25 mg via INTRAVENOUS
  Filled 2014-10-10: qty 1
  Filled 2014-10-10 (×5): qty 0.5
  Filled 2014-10-10: qty 1
  Filled 2014-10-10 (×5): qty 0.5

## 2014-10-10 MED ORDER — POTASSIUM CHLORIDE 20 MEQ/15ML (10%) PO SOLN
40.0000 meq | Freq: Once | ORAL | Status: DC
  Filled 2014-10-10: qty 30

## 2014-10-10 NOTE — Progress Notes (Signed)
Utilization review completed.  

## 2014-10-10 NOTE — Progress Notes (Signed)
Family refusing CPT due to port a cath

## 2014-10-10 NOTE — Progress Notes (Signed)
CRITICAL VALUE ALERT  Critical value received:  Lactic Acid 3.4  Date of notification:  10/10/2014  Time of notification:  4:40  Critical value read back:Yes.    Nurse who received alert:  Josie Dixon, RN  MD notified (1st page):  Schorr  Time of first page:  4:42  Responding MD:  Schorr  Time MD responded:  4:55

## 2014-10-10 NOTE — Progress Notes (Signed)
Pt removed from Bi pap and placed on 2L Imogene, tolerating well, sat 99%.

## 2014-10-10 NOTE — Progress Notes (Signed)
Family refusing CPAP due to portacath

## 2014-10-10 NOTE — Progress Notes (Signed)
CRITICAL VALUE ALERT  Critical value received:  WBC 1.3  Date of notification:  10/10/2014  Time of notification:  0925  Critical value read back:Yes.    Nurse who received alert:  Dwyne Hasegawa   MD notified (1st page):  Dr. Sherral Hammers  Time of first page:  MD on floor, made aware  MD notified (2nd page):  Time of second page:  Responding MD:  Dr. Sherral Hammers  Time MD responded:  Md on floor, made aware

## 2014-10-10 NOTE — Care Management Note (Signed)
Case Management Note  Patient Details  Name: Jeffery [name] MRN: [id number] Date of Birth: 1990-10-28  Subjective/Objective:                 Admitted with respiratory distress,  history of cerebral palsy, spastic quadriplegia, history of seizures, pancytopenia , obstructive sleep apnea on BiPAP daily at bedtime,  recurrent aspiration pneumonia, and status post PEG/J-tube.  Action/Plan: Return to home when medically stable. CM to f/u with disposition needs.  Expected Discharge Date:                  Expected Discharge Plan:  [place] (Active with [facility])  In-House Referral:     Discharge planning Services  CM Consult  Post Acute Care Choice:  Home Health Choice offered to:  Parent  DME Arranged:    DME Agency:     HH Arranged:  RN [place] Agency:  [place]  Status of Service:  In process, will continue to follow  Medicare Important Message Given:    Date Medicare IM Given:    Medicare IM give by:    Date Additional Medicare IM Given:    Additional Medicare Important Message give by:     If discussed at [place] of Stay Meetings, dates discussed:    Additional Comments: [name] (Legal Guardian) [phone], Evonnie Dawes (Other)  [phone]  Whitman Hero [place], RN,BSN, [place] 10/10/2014, 11:11 AM

## 2014-10-10 NOTE — Progress Notes (Signed)
This RN found Neupogen dose that is scheduled for 1700 in pt's med drawer. Pt's guardian is upset about the medication not being stored in the fridge and the was possibilily that medication was tubed as is not supposed to be shaken. She refuses dose of Neupogen and has called to have one of his injections from home brought to hospital. This RN called pharmacy and was instructed that it was okay to give medication even though medication was not found in refrigerator. Situation explained to pharmacist and she instructed this RN to put medication in fridge. Pharmacist later called this RN and told me that I could walk dose of neupogen down and receive another dose. Pt's guardian continues to refuse dose and would like to wait on his dose to arrive from home.

## 2014-10-10 NOTE — Progress Notes (Signed)
[facility]  Patient Status: Active (receiving services up to time of hospitalization)  [name] is providing the following services: RN  If patient discharges after hours, please call [phone].   Jeffery [name] 10/10/2014, 11:22 AM

## 2014-10-10 NOTE — Consult Note (Signed)
Referral MD  Reason for Referral: Pneumonia and chronic pancytopenia   Chief Complaint  Patient presents with  . Code Sepsis  . Fever  : No history given  HPI: Jeffery [name] is well-known to me. He is a 24 year old white male. He has cerebral palsy. He has chronic pancytopenia. He's had a bone marrow test done. We did not find any malignancy. There is no obvious myelodysplasia. It is felt that the pancytopenia could be from medications.  He was seen in the office last week. We gave him 2 units of blood. He also had congestion. We did a chest x-ray which showed right middle and right lower lobe pneumonia. He was given some IV antibiotics in the office and then sent home on oral antibiotics to be given through his feeding tube.  Unfortunately, on Wednesday morning, his father said that his temperature was 105. He was quite tachycardic. He went to the emergency room. He was then admitted. A chest x-ray yesterday showed improvement of the right lung opacities. He had some minimal atelectasis in the left midlung.  He is admitted to the stepdown unit. He was started on IV antibiotics. Cultures were taken.  His lactic acid level has been quite high. On admission, the lactic acid level was 3.4. His liver function tests are slightly elevated. His renal function is okay.  As an outpatient, he often gets Neupogen.     Past Medical History  Diagnosis Date  . CP (cerebral palsy), spastic, quadriplegic   . Osteoporosis   . Undescended testes   . Seasonal allergies   . IVH (intraventricular hemorrhage) Oct 22, 1990    Grade IV  . Hip dislocation, bilateral   . Dysphagia   . Retinopathy of prematurity   . 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 Neuromuscular scoliosis   . Osteoporosis   . Complex partial seizures   . Generalized convulsive epilepsy without mention of intractable epilepsy   . Sinus bradycardia     HR drops to 38-40 while sleeping  . Blister of right heel     fluid filled; origin  unknown  . Kidney stones     ?  Marland Kitchen Pneumonia      chronic pneumonia ,respitory failure dx Augest 2014  . Aspiration pneumonia     "chronic" (04/12/2014)  . OSA treated with BiPAP     "since age 70"   . Anemia   . 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 GERD (gastroesophageal reflux disease)   . Epilepsy   . Spastic quadriplegia   . Neutropenia 07/03/2014  :  Past Surgical History  Procedure Laterality Date  . Mole removal  "several B/T 2008-2010"    "from all over"  . Jejunostomy feeding tube  03/08/2013; ~ 09/2013; ~ 01/2014    "transgastric-jujunal feeding tube"  . Gastrostomy tube placement  11./02/1998       . Button change  12/15/2010    Procedure: BUTTON CHANGE;  Surgeon: Gatha Mayer, MD;  Location: [facility];  Service: Endoscopy;  Laterality: N/A;  . Peg placement  10/07/2011    Procedure: PERCUTANEOUS ENDOSCOPIC GASTROSTOMY (PEG) REPLACEMENT;  Surgeon: Lafayette Dragon, MD;  Location: [facility];  Service: Endoscopy;  Laterality: N/A;  Needs 18 F 2.5 button ordered-dl  . Inguinal hernia repair Bilateral 1992  . Retinopathy of prematurity surgery  1992  . Achilles tendon lengthening Bilateral 12/1998  . Tendon release  12/1998  Soft tissue releases  wrists and fingers [Other]  . Infusion pump implantation  07/25/2000; 2013    Intrathecal baclofen pump  . Peg placement N/A 06/05/2012    Procedure: PERCUTANEOUS ENDOSCOPIC GASTROSTOMY (PEG) REPLACEMENT;  Surgeon: Lafayette Dragon, MD;  Location: [facility];  Service: Endoscopy;  Laterality: N/A;  button 30f.2.5cm  . Strabismus surgery Bilateral 1993    "3 on right; 2 on left"  . Back surgery  ~ 2008    Harrington Rods in back needs to be log rolled  . Tendon repair Bilateral 09/05/2012    Procedure: LENGTHENING OF DIGITAL FLEXOR TENDONS BILTERAL HANDS;  Surgeon: DJolyn Nap MD;  [place] [facility]  Service: [place]ics;  Laterality: Bilateral;  . Peg placement  N/A 09/13/2012    Procedure: PERCUTANEOUS ENDOSCOPIC GASTROSTOMY (PEG) REPLACEMENT;  Surgeon: DLafayette Dragon MD;  Location: [facility];  Service: Endoscopy;  Laterality: N/A;  . Flexible sigmoidoscopy N/A 10/30/2012    Procedure: FLEXIBLE SIGMOIDOSCOPY;  Surgeon: RCleotis Nipper MD;  Location: [facility];  Service: Endoscopy;  Laterality: N/A;  . Peg placement N/A 11/15/2012    Procedure: PERCUTANEOUS ENDOSCOPIC GASTROSTOMY (PEG) REPLACEMENT;  Surgeon: MJeryl Columbia MD;  [facility];  Service: Endoscopy;  Laterality: N/A;  . Gastrostomy tube placement  11./02/1998  . Rectal biopsy  10/29/2012    S/P diarrhea from Vancomycin  . Portacath placement Right 11/25/2012    chest  . Eye surgery    :   Current facility-administered medications:  .  acetaminophen (TYLENOL) solution 500 mg, 500 mg, Per Tube, Q6H PRN, MMelton Alar PA-C, 500 mg at 10/09/14 1728 .  acetaminophen (TYLENOL) suppository 480 mg, 480 mg, Rectal, Q4H PRN, MBobby RumpfYork, PA-C .  arformoterol (BROVANA) nebulizer solution 15 mcg, 15 mcg, Nebulization, BID, MMelton Alar PA-C, Stopped at 10/09/14 1030 .  baclofen (GABLOFEN) intrathecal injection 40000 mcg/271m 40,000 mcg, Intrathecal, Continuous, MaMelton AlarPA-C, 0 mcg at 10/09/14 1302 .  bag balm ointment 1 g, 1 application, Topical, PRN, MaBobby Rumpfork, PA-C, 1 g at 10/10/14 0415 .  budesonide (PULMICORT) nebulizer solution 0.5 mg, 0.5 mg, Nebulization, BID, MaMelton AlarPA-C, Stopped at 10/09/14 1030 .  cloBAZam (ONFI) 2.5 MG/ML oral suspension 6.25 mg, 6.25 mg, Per Tube, 3 times per day, DaEugenie FillerMD, 6.25 mg at 10/09/14 2156 .  diazepam (DIASTAT) rectal kit 2.5 mg, 2.5 mg, Rectal, PRN **AND** diazepam (DIASTAT ACUDIAL) rectal kit 10 mg, 10 mg, Rectal, PRN, CuAllie BossierMD .  dicyclomine (BENTYL) 10 MG/5ML syrup 10 mg, 10 mg, Oral, Q8H PRN, MaMelton AlarPA-C .  diphenoxylate-atropine (LOMOTIL) 2.5-0.025 MG/5ML liquid 10 mL, 10  mL, Per Tube, Q6H PRN, MaBobby Rumpfork, PA-C .  enoxaparin (LOVENOX) injection 40 mg, 40 mg, Subcutaneous, Q24H, Marianne L York, PA-C, 40 mg at 10/09/14 1144 .  feeding supplement (PRO-STAT SUGAR FREE 64) liquid 30 mL, 30 mL, Per Tube, BID, MaBobby Rumpfork, PA-C, 30 mL at 10/09/14 2156 .  free water 180 mL, 180 mL, Per Tube, Q4H, Marianne L York, PA-C, 120 mL at 10/10/14 0001 .  gabapentin (NEURONTIN) 250 MG/5ML solution 900 mg, 900 mg, Per Tube, 3 times per day, DaEugenie FillerMD, 900 mg at 10/09/14 2155 .  guaiFENesin (ROBITUSSIN) 100 MG/5ML solution 600 mg, 30 mL, Per Tube, BID AC, Marianne L York, PA-C, 600 mg at 10/09/14 1911 .  ibuprofen (ADVIL,MOTRIN) 100 MG/5ML suspension 400 mg, 400 mg, Per Tube, Q6H PRN,  Melton Alar, PA-C, 400 mg at 10/09/14 2215 .  insulin aspart (novoLOG) injection 0-9 Units, 0-9 Units, Subcutaneous, 6 times per day, Melton Alar, PA-C, 0 Units at 10/09/14 1207 .  levalbuterol (XOPENEX) nebulizer solution 0.63 mg, 0.63 mg, Nebulization, Q6H, Marianne L York, PA-C, 0.63 mg at 10/10/14 0146 .  levalbuterol (XOPENEX) nebulizer solution 0.63 mg, 0.63 mg, Nebulization, Q2H PRN, Melton Alar, PA-C, 0.63 mg at 10/10/14 0345 .  lidocaine (XYLOCAINE) 5 % ointment, , Topical, QID PRN, Melton Alar, PA-C .  metoCLOPramide (REGLAN) 5 MG/5ML solution 10 mg, 10 mg, Per Tube, TID AC & HS, [name] York, PA-C, 10 mg at 10/09/14 2147 .  midazolam (VERSED) 5 mg/ml Pediatric INJ for INTRANASAL Use, 10 mg, Nasal, PRN, Melton Alar, PA-C .  multivitamin liquid 10 mL, 10 mL, Per Tube, Daily, Marianne L York, PA-C, 10 mL at 10/09/14 1800 .  mupirocin ointment (BACTROBAN) 2 % 1 application, 1 application, Topical, TID PRN, Melton Alar, PA-C .  omeprazole (PRILOSEC) 2 mg/mL oral suspension SUSP 20 mg, 20 mg, Oral, BID, Marianne L York, PA-C, 20 mg at 10/09/14 1800 .  piperacillin-tazobactam (ZOSYN) IVPB 3.375 g, 3.375 g, Intravenous, 3 times per day, Evelina Bucy, MD,  3.375 g at 10/10/14 5176 .  potassium chloride 20 MEQ/15ML (10%) solution 20 mEq, 20 mEq, Per Tube, Daily, Melton Alar, PA-C .  Pseudoephedrine HCl (SUDAFED) Syrup 30 mg, 30 mg, Per J Tube, 3 times per day, Eugenie Filler, MD, 30 mg at 10/09/14 2155 .  simethicone (MYLICON) chewable tablet 160 mg, 160 mg, Oral, QID PRN, Melton Alar, PA-C, 80 mg at 10/09/14 1943 .  sodium chloride 0.9 % injection 3 mL, 3 mL, Intravenous, Q12H, Marianne L York, PA-C, 3 mL at 10/09/14 1016 .  sodium phosphate (FLEET) enema 1 enema, 1 enema, Rectal, Daily PRN, Melton Alar, PA-C .  sucralfate (CARAFATE) 1 GM/10ML suspension 1 g, 1 g, Per Tube, Q12H, [name] York, PA-C, 1 g at 10/09/14 1805 .  TWOCAL HN liquid 545 mL, 545 mL, Per Tube, Q24H, Marianne L York, PA-C, 237 mL at 10/09/14 1946 .  vancomycin (VANCOCIN) IVPB 750 mg/150 ml premix, 750 mg, Intravenous, Q12H, Evelina Bucy, MD, 750 mg at 10/10/14 0509 .  zinc oxide (BALMEX) 11.3 % cream, , Topical, PRN, [name] York, PA-C:  . arformoterol  15 mcg Nebulization BID  . budesonide (PULMICORT) nebulizer solution  0.5 mg Nebulization BID  . cloBAZam  6.25 mg Per Tube 3 times per day  . enoxaparin (LOVENOX) injection  40 mg Subcutaneous Q24H  . feeding supplement (PRO-STAT SUGAR FREE 64)  30 mL Per Tube BID  . free water  180 mL Per Tube Q4H  . gabapentin  900 mg Per Tube 3 times per day  . guaiFENesin  30 mL Per Tube BID AC  . insulin aspart  0-9 Units Subcutaneous 6 times per day  . levalbuterol  0.63 mg Nebulization Q6H  . metoCLOPramide  10 mg Per Tube TID AC & HS  . multivitamin  10 mL Per Tube Daily  . omeprazole  20 mg Oral BID  . piperacillin-tazobactam  3.375 g Intravenous 3 times per day  . potassium chloride  20 mEq Per Tube Daily  . Pseudoephedrine HCl  30 mg Per J Tube 3 times per day  . sodium chloride  3 mL Intravenous Q12H  . sucralfate  1 g Per Tube Q12H  . TWOCAL HN  545 mL Per Tube Q24H  . vancomycin  750 mg Intravenous  Q12H  :  Allergies  Allergen Reactions  . Depakote [Divalproex Sodium]     Causes pancreatitis   . Vimpat [Lacosamide] Rash  . Keppra [Levetiracetam] Other (See Comments)    Bone marrow suppression  . Adhesive [Tape] Other (See Comments)    Rips skin off (paper tape is ok)  . Amoxicillin-Pot Clavulanate Rash    If given with fluconazole rash is not as severe or does not appear at all  . Neulasta [Pegfilgrastim] Other (See Comments)    Fever, tachycardia  :  Family History  Problem Relation Age of Onset  . Adopted: Yes  :  Social History   Social History  . Marital Status: Single    Spouse Name: N/A  . Number of Children: 0  . Years of Education: N/A   Occupational History  . Student    .     Social History Main Topics  . Smoking status: Never Smoker   . Smokeless tobacco: Never Used     Comment: never used tobacco  . Alcohol Use: No  . Drug Use: No  . Sexual Activity: No   Other Topics Concern  . Not on file   Social History Narrative   Pt lives at home with his legal guardians Jenne Campus)        :  Pertinent items are noted in HPI.  Exam: Patient Vitals for the past 24 hrs:  BP Temp Temp src Pulse Resp SpO2 Height Weight  10/10/14 0500 (!) 108/56 mmHg 99.4 F (37.4 C) Axillary (!) 107 15 100 % - -  10/10/14 0400 129/74 mmHg (!) 101.8 F (38.8 C) Axillary (!) 131 20 97 % - 129 lb 13.6 oz (58.9 kg)  10/10/14 0341 131/71 mmHg - - (!) 124 (!) 25 100 % - -  10/10/14 0324 - - - (!) 144 (!) 37 95 % - -  10/10/14 0200 116/64 mmHg (!) 100.9 F (38.3 C) Axillary (!) 118 15 98 % - -  10/10/14 0146 - - - (!) 121 (!) 24 99 % - -  10/10/14 0040 - - - (!) 123 (!) 25 100 % - -  10/10/14 0000 132/72 mmHg (!) 100.8 F (38.2 C) Axillary (!) 132 (!) 30 98 % - -  10/09/14 2300 122/67 mmHg (!) 101.4 F (38.6 C) Axillary (!) 127 20 95 % - -  10/09/14 2200 129/63 mmHg (!) 101.2 F (38.4 C) Axillary (!) 123 15 96 % - -  10/09/14 2100 (!) 123/48 mmHg - - (!) 117 14  96 % - -  10/09/14 2037 - - - (!) 115 (!) 22 100 % - -  10/09/14 2000 110/71 mmHg - - (!) 121 (!) 25 98 % - -  10/09/14 1945 - - - - - - 4' 2"  (1.27 m) 126 lb 12.2 oz (57.5 kg)  10/09/14 1900 114/66 mmHg 100.2 F (37.9 C) Axillary (!) 117 (!) 21 97 % - -  10/09/14 1800 (!) 112/57 mmHg - - - - 99 % - -  10/09/14 1719 - (!) 101.1 F (38.4 C) Axillary - - - - -  10/09/14 1700 (!) 119/47 mmHg - - - - - - -  10/09/14 1544 (!) 118/58 mmHg 100.1 F (37.8 C) Axillary (!) 119 (!) 21 96 % - 126 lb 12.2 oz (57.5 kg)  10/09/14 1430 115/62 mmHg - - 103 22 98 % - -  10/09/14  1415 119/61 mmHg - - (!) 122 24 99 % - -  10/09/14 1345 116/66 mmHg - - (!) 121 19 98 % - -  10/09/14 1331 117/65 mmHg 101.5 F (38.6 C) Rectal 120 (!) 31 99 % - -  10/09/14 1310 - 101.5 F (38.6 C) Rectal - - - - -  10/09/14 1245 117/64 mmHg - - 120 16 98 % - -  10/09/14 1230 101/65 mmHg - - 118 (!) 28 100 % - -  10/09/14 1223 - - - - - 99 % - -  10/09/14 1215 117/60 mmHg - - (!) 123 17 97 % - -  10/09/14 1200 105/56 mmHg - - 116 13 98 % - -  10/09/14 1139 114/69 mmHg 101.7 F (38.7 C) Rectal 114 23 99 % - -  10/09/14 1130 114/69 mmHg - - 113 16 100 % - -  10/09/14 1115 116/62 mmHg - - 118 25 99 % - -  10/09/14 1100 117/66 mmHg - - 120 23 98 % - -  10/09/14 1050 116/70 mmHg 102.2 F (39 C) Rectal (!) 124 23 100 % - -  10/09/14 1042 115/67 mmHg 102.2 F (39 C) Rectal (!) 121 19 100 % - -  10/09/14 1030 115/67 mmHg - - (!) 122 21 100 % - -  10/09/14 1018 - 102.4 F (39.1 C) Rectal - - - - -  10/09/14 1015 123/74 mmHg - - (!) 124 (!) 28 98 % - -  10/09/14 1000 116/82 mmHg - - (!) 127 24 98 % - -  10/09/14 0947 - - - - - 99 % - -  10/09/14 0945 120/72 mmHg - - (!) 129 (!) 29 99 % - -  10/09/14 0930 118/72 mmHg - - (!) 131 23 93 % - -  10/09/14 0926 100/67 mmHg 102.4 F (39.1 C) Rectal (!) 132 (!) 28 100 % - -  10/09/14 0915 100/67 mmHg - - (!) 129 20 100 % - -  10/09/14 0900 123/80 mmHg - - (!) 130 24 99 % - -   10/09/14 0845 125/71 mmHg - - (!) 129 (!) 31 99 % - -  10/09/14 0842 - 102.4 F (39.1 C) Rectal - - - - -  10/09/14 0839 124/65 mmHg - - (!) 126 (!) 27 - - -  10/09/14 0827 - (!) 103.6 F (39.8 C) Rectal - - - - -  10/09/14 7903 - - - - - - - 124 lb (56.246 kg)  10/09/14 0800 123/88 mmHg - - (!) 140 (!) 45 99 % - -  10/09/14 0751 - - - - - 97 % - -  10/09/14 0745 127/66 mmHg - - (!) 138 (!) 38 99 % - -  10/09/14 0730 117/100 mmHg - - (!) 140 (!) 31 99 % - -   As above    Recent Labs  10/09/14 0737  WBC 5.1  HGB 12.4*  HCT 41.0  PLT 171    Recent Labs  10/09/14 0737 10/10/14 0350  NA 145 142  K 4.0 3.3*  CL 104 104  CO2 31 29  GLUCOSE 114* 115*  BUN 50* 38*  CREATININE 0.99 1.01  CALCIUM 8.4* 8.0*    Blood smear review:  none   Pathology: None     Assessment and Plan:  Jeffery [name] is a 24 year old white male. He has cerebral palsy. He has a feeding tube that he gets his nutrition through. He probably has some aspiration.  There is no blood work back yet today. We will have to see what the labs look like. The transfusion that he got last Friday certainly help with his hemoglobin being 12.4 when he was admitted.  The lactic acid level is somewhat troublesome.  We will certainly follow along and try to help out in any way possible.  I appreciate the outstanding care that he is getting from the staff in the stepdown unit!!!  Harriette Ohara 17:14

## 2014-10-10 NOTE — Progress Notes (Signed)
Patient father requested RT to NTS patient. Patient was very rhonchus and RR was increased. RT suctioned patient and got a small amount of white thick secretions. Patient is settled more and now on BiPAP. RT will continue to monitor.

## 2014-10-10 NOTE — Progress Notes (Signed)
**Jeffery [name] De-Identified via Obfuscation** Jeffery Jeffery [name]  Jeffery Jeffery [name] DHR:[id number] DOB: October 23, 1990 DOA: 10/09/2014 PCP: Marijean Bravo, MD  Admit HPI / Brief Narrative: 24 y.o. WM PMHx cerebral palsy, spastic quadriplegia, wheelchair mobile, seizure disorder, has Port-A-Cath, PEG tube, hx of pancytopenia attributed to [place] being managed by oncology with transfusions as needed as well as Neulasta, obstructive sleep apnea on BiPAP daily at bedtime, history of recurrent aspiration pneumonia, status post PEG/J-tube placement presented to the ED with worsening shortness of breath over the past week requiring 02, respiratory distress. Approximate 1 week prior to admission patient was diagnosed with a right middle and right lower lobe pneumonia treated in the outpatient setting by Dr. Marin Olp (oncology) with IM Rocephin 2 and subsequently on oral cefdinir with no significant improvement. Patient presented with increased work of breathing, productive cough of thickened secretions, fever with temperature as high as 105 rectally. Patient was noted to be septic with a fever, tachycardic with heart rate in the 140s, and increased respiratory rate as high as 45. Chest x-ray done actually did show some improvement from prior chest x-ray.  HPI/Subjective: 9/8 MAXIMUM TEMPERATURE overnight 38.8C  Assessment/Plan: Acute respiratory distress with Hypoxia -Likely secondary to partially treated HCAP vs Aspiration pneumonia.  -Lactic acid elevated. Sepsis protocol in place.  -Check a sputum Gram stain and culture. Check a urine Legionella antigen. Check a urine pneumococcus antigen.  -O2/BiPAP as needed; obtain ABG in the a.m. -Continue Physiotherapy with vibrator -Continue Brovana and Pulmicort nebulizer BID -Continue guaifenesin 600 mg BID -Continue current antibiotics. -NTS PRN  HCAP +/- aspiration pneumonia -Patient presenting with the clinical diagnosis of pneumonia. Chest x-ray with some improvement  from prior chest x-ray however patient also dehydrated  Sepsis likely secondary to probable pneumonia -Repeat chest x-ray in the morning.  dysphagia status post PEG/ J tube placement -When not on BiPAP resume tube feeds   seizures/generalized convulsive epilepsy -Tonic/clonic seizures 2 witnessed today. Most likely secondary to patient's continued elevated temperature  -Place Arctic sun or: Blanket maintain body temp 37C -Seizure protocol -Continue Diastat rectal kit (Jeffery [name] Versed CAN NOT be used in conjunction with this medication)  -Continue CloBazam (Onfi) 6.25 mg TID.  cerebral palsy/spastic quadriplegic -Continue baclofen pump. -Patient receiving Botox injections every 3 months per neurology area outpatient follow-up.  Transaminitis -Likely secondary to sepsis versus dehydration.   Pancytopenia -Being seen by his oncologist Dr. Marin Olp. -S/P2 units PRBC last week as outpatient  -Continue Neupogen 480 g daily .  Sinus tachycardia -Likely secondary to acute infection.  -Normal saline 75 ml/hr    Code Status: FULL Family Communication: Mother present at time of exam Disposition Plan: Resolution sepsis    Consultants: Dr. Marin Olp (oncology)  Procedure/Significant Events:    Culture   Antibiotics: Zosyn 9/7>> Vancomycin 9/7 >>   DVT prophylaxis: SCD   Devices    LINES / TUBES:      Continuous Infusions: . sodium chloride    . baclofen    . dextrose 5 % and 0.45% NaCl 150 mL/hr at 10/10/14 0916    Objective: VITAL SIGNS: Temp: 101.1 F (38.4 C) (09/08 1624) Temp Source: Axillary (09/08 1624) BP: 110/62 mmHg (09/08 1700) Pulse Rate: 114 (09/08 1700) SPO2; FIO2:   Intake/Output Summary (Last 24 hours) at 10/10/14 2017 Last data filed at 10/10/14 1800  Gross per 24 hour  Intake   1800 ml  Output    575 ml  Net   1225 ml  Exam: General: Patient agitated, will open eyes when name called, does not follow commands positive   acute respiratory distress Eyes: Unable to totally evaluate, negative scleral hemorrhage ENT: Negative Runny nose, negative gingival bleeding, Neck:  Negative scars, masses, torticollis, lymphadenopathy, JVD Lungs: diffuse rhonchi Rt>>Lt, without wheezes or crackles Cardiovascular: Tachycardic, Regular rhythm without murmur gallop or rub normal S1 and S2 Abdomen:negative abdominal pain, negative dysphagia, nondistended, positive soft, bowel sounds, no rebound, no ascites, no appreciable mass, PEG tube in place negative sign of infection Extremities: bilateral lower extremity deformed extremities secondary to cerebral palsy; bilateral distal and hips secondary to cerebral palsy Psychiatric:  Unable to assess  Neurologic:  Withdraws to pain unable to complete assessment    Data Reviewed: Basic Metabolic Panel:  Recent Labs Lab 10/09/14 0737 10/10/14 0350 10/10/14 1029 10/10/14 1606  NA 145 142  --  144  K 4.0 3.3*  --  3.6  CL 104 104  --  111  CO2 31 29  --  30  GLUCOSE 114* 115*  --  99  BUN 50* 38*  --  27*  CREATININE 0.99 1.01  --  0.78  CALCIUM 8.4* 8.0*  --  7.9*  MG  --   --  2.2 2.3   Liver Function Tests:  Recent Labs Lab 10/09/14 0737 10/10/14 0350  AST 123* 68*  ALT 148* 114*  ALKPHOS 140* 106  BILITOT 0.4 0.8  PROT 5.5* 4.8*  ALBUMIN 2.3* 2.0*   No results for input(s): LIPASE, AMYLASE in the last 168 hours. No results for input(s): AMMONIA in the last 168 hours. CBC:  Recent Labs Lab 10/09/14 0737 10/10/14 0748  WBC 5.1 1.3*  NEUTROABS 3.9 0.8*  HGB 12.4* 8.6*  HCT 41.0 28.2*  MCV 108.2* 106.8*  PLT 171 130*   Cardiac Enzymes: No results for input(s): CKTOTAL, CKMB, CKMBINDEX, TROPONINI in the last 168 hours. BNP (last 3 results) No results for input(s): BNP in the last 8760 hours.  ProBNP (last 3 results) No results for input(s): PROBNP in the last 8760 hours.  CBG:  Recent Labs Lab 10/10/14 0045 10/10/14 0403 10/10/14 0855  10/10/14 1134 10/10/14 1645  GLUCAP 112* 121* 112* 127* 101*    Recent Results (from the past 240 hour(s))  Blood Culture (routine x 2)     Status: None (Preliminary result)   Collection Time: 10/09/14  7:37 AM  Result Value Ref Range Status   Specimen Description BLOOD PORTA CATH  Final   Special Requests BOTTLES DRAWN AEROBIC AND ANAEROBIC 5CC  Final   Culture NO GROWTH 1 DAY  Final   Report Status PENDING  Incomplete  Urine culture     Status: None   Collection Time: 10/09/14  7:41 AM  Result Value Ref Range Status   Specimen Description URINE, CATHETERIZED  Final   Special Requests NONE  Final   Culture MULTIPLE SPECIES PRESENT, SUGGEST RECOLLECTION  Final   Report Status 10/10/2014 FINAL  Final  Blood Culture (routine x 2)     Status: None (Preliminary result)   Collection Time: 10/09/14  7:57 AM  Result Value Ref Range Status   Specimen Description BLOOD HAND LEFT  Final   Special Requests BOTTLES DRAWN AEROBIC ONLY 5CC  Final   Culture NO GROWTH 1 DAY  Final   Report Status PENDING  Incomplete  Culture, respiratory (NON-Expectorated)     Status: None (Preliminary result)   Collection Time: 10/09/14  2:00 PM  Result Value Ref Range Status  Specimen Description TRACHEAL ASPIRATE  Final   Special Requests Normal  Final   Gram Stain   Final    RARE WBC PRESENT, PREDOMINANTLY MONONUCLEAR RARE SQUAMOUS EPITHELIAL CELLS PRESENT NO ORGANISMS SEEN Performed at [facility]    Culture   Final    MODERATE GRAM NEGATIVE RODS Performed at [facility]    Report Status PENDING  Incomplete  MRSA PCR Screening     Status: None   Collection Time: 10/09/14  4:04 PM  Result Value Ref Range Status   MRSA by PCR NEGATIVE NEGATIVE Final    Comment:        The GeneXpert MRSA Assay (FDA approved for NASAL specimens only), is one component of a comprehensive MRSA colonization surveillance program. It is not intended to diagnose MRSA infection nor to guide  or monitor treatment for MRSA infections.      Studies:  Recent x-ray studies have been reviewed in detail by the Attending Physician  Scheduled Meds:  Scheduled Meds: . arformoterol  15 mcg Nebulization BID  . budesonide (PULMICORT) nebulizer solution  0.5 mg Nebulization BID  . cloBAZam  6.25 mg Per Tube 3 times per day  . diphenhydrAMINE  25 mg Intravenous Daily  . feeding supplement (PRO-STAT SUGAR FREE 64)  30 mL Per Tube BID  . filgrastim  480 mcg Subcutaneous Daily  . folic acid  1 mg Intravenous Daily  . free water  180 mL Per Tube Q4H  . gabapentin  900 mg Per Tube 3 times per day  . guaiFENesin  30 mL Per Tube BID AC  . insulin aspart  0-9 Units Subcutaneous 6 times per day  . levalbuterol  0.63 mg Nebulization Q6H  . metoCLOPramide  10 mg Per Tube TID AC & HS  . multivitamin  10 mL Per Tube Daily  . omeprazole  20 mg Oral BID  . piperacillin-tazobactam  3.375 g Intravenous 3 times per day  . potassium chloride  20 mEq Per Tube Daily  . Pseudoephedrine HCl  30 mg Per J Tube 3 times per day  . sodium chloride  3 mL Intravenous Q12H  . sucralfate  1 g Per Tube Q12H  . thiamine  100 mg Intravenous Daily  . TWOCAL HN  545 mL Per Tube Q24H  . vancomycin  750 mg Intravenous Q12H    Time spent on care of this patient: 40 mins   Cleopatra Sardo, Geraldo Docker , MD  [facility] Office  [phone] Pager - [phone]  On-Call/Text Page:      [email or web address]      password [email or web address]  If 7PM-7AM, please contact night-coverage [email or web address] Password [email or web address] 10/10/2014, 8:17 PM   LOS: 1 day   Care during the described time interval was provided by me .  I have reviewed this patient's available data, including medical history, events of Jeffery [name], physical examination, and all test results as part of my evaluation. I have personally reviewed and interpreted all radiology studies.   Dia Crawford, MD [phone] Pager

## 2014-10-10 NOTE — Progress Notes (Signed)
Notified by mother pt was having seizure. Upon arrival in room patient experiencing a grand-mal seizure. HR 130-140's ST, RR mid to upper 30's, 02 maintained greater than 95 for duration of seizure.  I opted to take pt off bi-pap for risk of aspiration, but due to mothers request bipap was left on. Family educated with risks of aspiration with leaving bipap on. MD Sherral Hammers notified. Cooling blanket ordered. Will continue to monitor.

## 2014-10-10 NOTE — Progress Notes (Signed)
The father of the patient called the RN to the room.  RN noted the pt had seizure like activity starting at 3:23 and lasting until 3:24.  Patient placed on non-rebreather until seizure passed.  Pt placed back on nasal canula post seizure.  Vital signs stable and no signs or symptoms of distress.  MD notified of seizure activity.  Will continue to monitor pt.

## 2014-10-11 ENCOUNTER — Inpatient Hospital Stay (HOSPITAL_COMMUNITY): Payer: Medicaid Other

## 2014-10-11 DIAGNOSIS — R569 Unspecified convulsions: Secondary | ICD-10-CM | POA: Diagnosis present

## 2014-10-11 DIAGNOSIS — E872 Acidosis, unspecified: Secondary | ICD-10-CM | POA: Diagnosis present

## 2014-10-11 DIAGNOSIS — J9601 Acute respiratory failure with hypoxia: Secondary | ICD-10-CM | POA: Diagnosis present

## 2014-10-11 DIAGNOSIS — G8250 Quadriplegia, unspecified: Secondary | ICD-10-CM | POA: Diagnosis present

## 2014-10-11 DIAGNOSIS — D649 Anemia, unspecified: Secondary | ICD-10-CM

## 2014-10-11 DIAGNOSIS — G8389 Other specified paralytic syndromes: Secondary | ICD-10-CM

## 2014-10-11 DIAGNOSIS — E876 Hypokalemia: Secondary | ICD-10-CM

## 2014-10-11 LAB — BLOOD GAS, ARTERIAL
Acid-base deficit: 0.4 mmol/L (ref 0.0–2.0)
BICARBONATE: 24.3 meq/L — AB (ref 20.0–24.0)
DRAWN BY: 365271
Delivery systems: POSITIVE
EXPIRATORY PAP: 7
FIO2: 0.3
Inspiratory PAP: 12
O2 SAT: 95.3 %
PATIENT TEMPERATURE: 98.7
PO2 ART: 81.4 mmHg (ref 80.0–100.0)
TCO2: 25.6 mmol/L (ref 0–100)
pCO2 arterial: 44.1 mmHg (ref 35.0–45.0)
pH, Arterial: 7.36 (ref 7.350–7.450)

## 2014-10-11 LAB — COMPREHENSIVE METABOLIC PANEL
ALBUMIN: 1.9 g/dL — AB (ref 3.5–5.0)
ALK PHOS: 84 U/L (ref 38–126)
ALT: 66 U/L — AB (ref 17–63)
AST: 32 U/L (ref 15–41)
Anion gap: 7 (ref 5–15)
BILIRUBIN TOTAL: 0.8 mg/dL (ref 0.3–1.2)
BUN: 19 mg/dL (ref 6–20)
CALCIUM: 8 mg/dL — AB (ref 8.9–10.3)
CO2: 26 mmol/L (ref 22–32)
CREATININE: 0.82 mg/dL (ref 0.61–1.24)
Chloride: 107 mmol/L (ref 101–111)
GFR calc Af Amer: >60 mL/min (ref >60)
GFR calc non Af Amer: >60 mL/min (ref >60)
GLUCOSE: 97 mg/dL (ref 65–99)
POTASSIUM: 3.1 mmol/L — AB (ref 3.5–5.1)
Sodium: 140 mmol/L (ref 135–145)
TOTAL PROTEIN: 4.5 g/dL — AB (ref 6.5–8.1)

## 2014-10-11 LAB — CULTURE, RESPIRATORY

## 2014-10-11 LAB — CBC WITH DIFFERENTIAL/PLATELET
BASOS ABS: 0 10*3/uL (ref 0.0–0.1)
Basophils Relative: 0 % (ref 0–1)
EOS PCT: 2 % (ref 0–5)
Eosinophils Absolute: 0.1 10*3/uL (ref 0.0–0.7)
HEMATOCRIT: 24.5 % — AB (ref 39.0–52.0)
Hemoglobin: 7.6 g/dL — ABNORMAL LOW (ref 13.0–17.0)
LYMPHS ABS: 0.5 10*3/uL — AB (ref 0.7–4.0)
LYMPHS PCT: 13 % (ref 12–46)
MCH: 33.3 pg (ref 26.0–34.0)
MCHC: 31 g/dL (ref 30.0–36.0)
MCV: 107.5 fL — AB (ref 78.0–100.0)
MONO ABS: 0.4 10*3/uL (ref 0.1–1.0)
Monocytes Relative: 12 % (ref 3–12)
NEUTROS ABS: 2.6 10*3/uL (ref 1.7–7.7)
Neutrophils Relative %: 73 % (ref 43–77)
PLATELETS: 109 10*3/uL — AB (ref 150–400)
RBC: 2.28 MIL/uL — AB (ref 4.22–5.81)
RDW: 17.4 % — ABNORMAL HIGH (ref 11.5–15.5)
WBC: 3.6 10*3/uL — AB (ref 4.0–10.5)

## 2014-10-11 LAB — GLUCOSE, CAPILLARY
GLUCOSE-CAPILLARY: 107 mg/dL — AB (ref 65–99)
Glucose-Capillary: 120 mg/dL — ABNORMAL HIGH (ref 65–99)
Glucose-Capillary: 96 mg/dL (ref 65–99)
Glucose-Capillary: 85 mg/dL (ref 65–99)
Glucose-Capillary: 88 mg/dL (ref 65–99)

## 2014-10-11 LAB — HEMOGLOBIN AND HEMATOCRIT, BLOOD
HEMATOCRIT: 32.1 % — AB (ref 39.0–52.0)
Hemoglobin: 10 g/dL — ABNORMAL LOW (ref 13.0–17.0)

## 2014-10-11 LAB — PREPARE RBC (CROSSMATCH)

## 2014-10-11 LAB — LACTIC ACID, PLASMA
Lactic Acid, Venous: 3.9 mmol/L (ref 0.5–2.0)
Lactic Acid, Venous: 3.9 mmol/L (ref 0.5–2.0)

## 2014-10-11 LAB — CULTURE, RESPIRATORY W GRAM STAIN: Special Requests: NORMAL

## 2014-10-11 MED ORDER — POTASSIUM CHLORIDE 10 MEQ/50ML IV SOLN
10.0000 meq | INTRAVENOUS | Status: AC
  Administered 2014-10-11 (×3): 10 meq via INTRAVENOUS
  Filled 2014-10-11 (×3): qty 50

## 2014-10-11 MED ORDER — LEVOFLOXACIN IN D5W 750 MG/150ML IV SOLN
750.0000 mg | INTRAVENOUS | Status: DC
Start: 2014-10-11 — End: 2014-10-16
  Administered 2014-10-11 – 2014-10-15 (×5): 750 mg via INTRAVENOUS
  Filled 2014-10-11 (×6): qty 150

## 2014-10-11 MED ORDER — SIMETHICONE 40 MG/0.6ML PO SUSP
160.0000 mg | Freq: Four times a day (QID) | ORAL | Status: DC | PRN
  Administered 2014-10-13 – 2014-10-19 (×9): 160 mg
  Filled 2014-10-11 (×15): qty 2.4

## 2014-10-11 MED ORDER — POTASSIUM CHLORIDE 10 MEQ/100ML IV SOLN: 10.0000 meq | INTRAVENOUS | Status: DC

## 2014-10-11 MED ORDER — POTASSIUM CHLORIDE 10 MEQ/50ML IV SOLN
10.0000 meq | Freq: Once | INTRAVENOUS | Status: AC
  Administered 2014-10-11: 10 meq via INTRAVENOUS
  Filled 2014-10-11: qty 50

## 2014-10-11 MED ORDER — FUROSEMIDE 10 MG/ML IJ SOLN
20.0000 mg | Freq: Once | INTRAMUSCULAR | Status: AC
  Administered 2014-10-11: 20 mg via INTRAVENOUS

## 2014-10-11 MED ORDER — SODIUM CHLORIDE 0.9 % IV SOLN
Freq: Once | INTRAVENOUS | Status: AC
  Administered 2014-10-11: 14:00:00 via INTRAVENOUS

## 2014-10-11 MED ORDER — SODIUM CHLORIDE 0.9 % IV BOLUS (SEPSIS)
250.0000 mL | Freq: Once | INTRAVENOUS | Status: AC
  Administered 2014-10-11: 250 mL via INTRAVENOUS

## 2014-10-11 NOTE — Progress Notes (Signed)
RT Note: Rt paged Dr. Sherral Hammers late morrning today for clarification regarding the patients chest physiotherapy. RT has not received a call back from MD as of yet. RT will continue to monitor.

## 2014-10-11 NOTE — Progress Notes (Signed)
RT Note: Patients father has refused his chest physiotherapy for this afternoon stating that the patient just received some medication and he does not want him to have the therapy now because of that. He is also expressing concern regarding the patients Porta Cath and he wants to confirm with the physician that it is safe to do the therapy with that in place. Dr. Sherral Hammers has been paged and I am awaiting a call back. Per Dr. Sherral Hammers' note, it appears that he discussed with the family the chest physiotherapy and [place] [facility] situation already but RT is paging regarding it anyway per the family's request. Rt will continue to monitor.

## 2014-10-11 NOTE — Progress Notes (Signed)
Jeffery [name] is still having some temperatures. He is on Zosyn and mycin. His blood cultures are negative. His sputum does have gram-negative rods. It will be interesting to see what they are. Is possible that he is developing resistance to antibiotics.  His hemoglobin dropped to 7.6. I think he is going to be transfused. I will give him 1 unit of blood. His white cell count came up a little bit.  It is apparent that anytime that he gets any type of stress on his body, that his bone marrow is going to "shut down".  It is hard to say how much he is getting nutrition wise. He gets tube feeds.  His parents were not in the room this morning. I am glad that they were able to go home and get some rest. They will have been doing great job with him and they really need to take care of themselves.    He had a chest x-ray yesterday. It shows some by basilar atelectasis. A chest x-ray was done today. There is also pending.  Really done have any great suggestions. Again, his bone marrow is very sensitive to any changes with his body area and again, any stress that happens to him, his bone marrow will really slow down and his blood counts will drop.  He is on Neupogen. I'll keep him on this for right now.  I very much appreciate all the great care that he is getting from the staff in the stepdown unit!!!  [name]  Hebrews 12:12

## 2014-10-11 NOTE — Progress Notes (Signed)
[place] TEAM 1 - Stepdown/ICU TEAM Progress Note  Jeffery [name] WER:[id number] DOB: Oct 10, 1990 DOA: 10/09/2014 PCP: Marijean Bravo, MD  Admit HPI / Brief Narrative: 24 y.o. WM PMHx cerebral palsy, spastic quadriplegia, wheelchair mobile, seizure disorder, has Port-A-Cath, PEG tube, hx of pancytopenia attributed to [place] being managed by oncology with transfusions as needed as well as Neulasta, obstructive sleep apnea on BiPAP daily at bedtime, history of recurrent aspiration pneumonia, status post PEG/J-tube placement presented to the ED with worsening shortness of breath over the past week requiring 02, respiratory distress. Approximate 1 week prior to admission patient was diagnosed with a right middle and right lower lobe pneumonia treated in the outpatient setting by Dr. Marin Olp (oncology) with IM Rocephin 2 and subsequently on oral cefdinir with no significant improvement. Patient presented with increased work of breathing, productive cough of thickened secretions, fever with temperature as high as 105 rectally. Patient was noted to be septic with a fever, tachycardic with heart rate in the 140s, and increased respiratory rate as high as 45. Chest x-ray done actually did show some improvement from prior chest x-ray.  HPI/Subjective: 9/9 MAXIMUM TEMPERATURE overnight 38.5C  Assessment/Plan: Acute respiratory distress with Hypoxia -Likely secondary to partially treated HCAP vs Aspiration pneumonia.  -Lactic acid elevated. Sepsis protocol in place.  -Trach aspirate positive Enterobacter resistant to Zosyn  -O2/BiPAP as needed -Continue Physiotherapy with vibrator; spoke with Mr. Soberano and they agree, as long as we avoid his Port-A-Cath right chest wall -Continue Brovana and Pulmicort nebulizer BID -Continue guaifenesin 600 mg BID -DC Zosyn and started on Levaquin -NTS PRN -Continue D5-0.45% saline to 150 ml/hr -Strict in and out  HCAP +/- aspiration pneumonia -Patient presenting  with the clinical diagnosis of pneumonia.  -9/9 CXR; worsening RLL atelectasis/pneumonia  -Tracheal aspirate positive GNR;(ENTEROBACTER AEROGENES), resistant to Zosyn, DC Zosyn and start levofloxacin  Sepsis likely secondary to probable pneumonia, UTI? -Repeat CXR; worsening atelectasis/pneumonia RLL -9/9 Repeat urine culture .  Lactic acidosis -See acute respiratory distress  dysphagia status post PEG/ J tube placement -When not on BiPAP resume tube feeds   seizures/generalized convulsive epilepsy -9/8 Tonic/clonic seizures 2 witnessed today. Most likely secondary to patient's continued elevated temperature  -Continue cooling blanket maintain body temp 37C -Seizure protocol -Continue Diastat rectal kit (NOTE Versed CAN NOT be used in conjunction with this medication)  -Continue CloBazam (Onfi) 6.25 mg TID. -9/9 negative overnight seizures.  cerebral palsy/spastic quadriplegic -Continue baclofen pump. -Patient receiving Botox injections every 3 months per neurology area outpatient follow-up.  Transaminitis -Likely secondary to sepsis versus dehydration.   Pancytopenia -Being seen by his oncologist Dr. Marin Olp. -S/P 2 units PRBC last week as outpatient  -Continue Neupogen 480 g daily . -9/9 will transfuse 1 unit PRBC  Sinus tachycardia -Likely secondary to acute infection, improving.   Hypokalemia -Potassium IV 10 mEq 4 runs   Code Status: DO NOT RESUSCITATE Family Communication: Father present at time of exam Disposition Plan: Resolution sepsis    Consultants: Dr. Marin Olp (oncology)   Procedure/Significant Events: S/P 2 units PRBC last week as outpatient  -9/9 will transfuse 1 unit PRBC   Culture 9/7 blood Porta cath/left hand NGTD 9/7 urine positive multiple species  9/7 trach aspirate positive GNR (ENTEROBACTER AEROGENES), resistant to Zosyn 9/7 MRSA by PCR negative 9/7 strep pneumo urine antigen/Legionella urine antigen  negative    Antibiotics: Zosyn 9/7>> stopped 9/9 Vancomycin 9/7 >> Levofloxacin 9/9>>  DVT prophylaxis: SCD   Devices    LINES /  TUBES:      Continuous Infusions: . baclofen    . dextrose 5 % and 0.45% NaCl 150 mL/hr at 10/11/14 1125    Objective: VITAL SIGNS: Temp: 99.3 F (37.4 C) (09/09 1251) Temp Source: Axillary (09/09 1251) BP: 122/71 mmHg (09/09 1251) Pulse Rate: 107 (09/09 1251) SPO2; FIO2:   Intake/Output Summary (Last 24 hours) at 10/11/14 1253 Last data filed at 10/11/14 0555  Gross per 24 hour  Intake   2630 ml  Output    475 ml  Net   2155 ml     Exam: General: Patient appears much more comfortable today, eyes open spontaneously. does not follow commands negative acute respiratory distress Eyes: Pupils equal round reactive to light and accommodation, negative scleral hemorrhage ENT: Negative Runny nose, negative gingival bleeding, Neck:  Negative scars, masses, torticollis, lymphadenopathy, JVD Lungs: diffuse rhonchi Rt>>Lt, without wheezes or crackles Cardiovascular: Regular rhythm and rate without murmur gallop or rub normal S1 and S2 Abdomen:negative abdominal pain, nondistended, positive soft, bowel sounds, no rebound, no ascites, no appreciable mass, PEG tube in place negative sign of infection Extremities: bilateral lower extremity deformed extremities secondary to cerebral palsy; bilateral distal and hips secondary to cerebral palsy Psychiatric:  Unable to assess  Neurologic:  Withdraws to pain unable to complete assessment    Data Reviewed: Basic Metabolic Panel:  Recent Labs Lab 10/09/14 0737 10/10/14 0350 10/10/14 1029 10/10/14 1606 10/11/14 0913  NA 145 142  --  144 140  K 4.0 3.3*  --  3.6 3.1*  CL 104 104  --  111 107  CO2 31 29  --  30 26  GLUCOSE 114* 115*  --  99 97  BUN 50* 38*  --  27* 19  CREATININE 0.99 1.01  --  0.78 0.82  CALCIUM 8.4* 8.0*  --  7.9* 8.0*  MG  --   --  2.2 2.3  --    Liver Function  Tests:  Recent Labs Lab 10/09/14 0737 10/10/14 0350 10/11/14 0913  AST 123* 68* 32  ALT 148* 114* 66*  ALKPHOS 140* 106 84  BILITOT 0.4 0.8 0.8  PROT 5.5* 4.8* 4.5*  ALBUMIN 2.3* 2.0* 1.9*   No results for input(s): LIPASE, AMYLASE in the last 168 hours. No results for input(s): AMMONIA in the last 168 hours. CBC:  Recent Labs Lab 10/09/14 0737 10/10/14 0748 10/11/14 0350  WBC 5.1 1.3* 3.6*  NEUTROABS 3.9 0.8* 2.6  HGB 12.4* 8.6* 7.6*  HCT 41.0 28.2* 24.5*  MCV 108.2* 106.8* 107.5*  PLT 171 130* 109*   Cardiac Enzymes: No results for input(s): CKTOTAL, CKMB, CKMBINDEX, TROPONINI in the last 168 hours. BNP (last 3 results) No results for input(s): BNP in the last 8760 hours.  ProBNP (last 3 results) No results for input(s): PROBNP in the last 8760 hours.  CBG:  Recent Labs Lab 10/10/14 2018 10/10/14 2312 10/11/14 0351 10/11/14 0854 10/11/14 1151  GLUCAP 136* 121* 120* 96 107*    Recent Results (from the past 240 hour(s))  Blood Culture (routine x 2)     Status: None (Preliminary result)   Collection Time: 10/09/14  7:37 AM  Result Value Ref Range Status   Specimen Description BLOOD PORTA CATH  Final   Special Requests BOTTLES DRAWN AEROBIC AND ANAEROBIC 5CC  Final   Culture NO GROWTH 1 DAY  Final   Report Status PENDING  Incomplete  Urine culture     Status: None   Collection Time: 10/09/14  7:41  AM  Result Value Ref Range Status   Specimen Description URINE, CATHETERIZED  Final   Special Requests NONE  Final   Culture MULTIPLE SPECIES PRESENT, SUGGEST RECOLLECTION  Final   Report Status 10/10/2014 FINAL  Final  Blood Culture (routine x 2)     Status: None (Preliminary result)   Collection Time: 10/09/14  7:57 AM  Result Value Ref Range Status   Specimen Description BLOOD HAND LEFT  Final   Special Requests BOTTLES DRAWN AEROBIC ONLY 5CC  Final   Culture NO GROWTH 1 DAY  Final   Report Status PENDING  Incomplete  Culture, respiratory  (NON-Expectorated)     Status: None   Collection Time: 10/09/14  2:00 PM  Result Value Ref Range Status   Specimen Description TRACHEAL ASPIRATE  Final   Special Requests Normal  Final   Gram Stain   Final    RARE WBC PRESENT, PREDOMINANTLY MONONUCLEAR RARE SQUAMOUS EPITHELIAL CELLS PRESENT NO ORGANISMS SEEN Performed at [facility]    Culture   Final    MODERATE ENTEROBACTER AEROGENES Performed at [facility]    Report Status 10/11/2014 FINAL  Final   Organism ID, Bacteria ENTEROBACTER AEROGENES  Final      Susceptibility   Enterobacter aerogenes - MIC*    CEFAZOLIN >=64 RESISTANT Resistant     CEFEPIME <=1 SENSITIVE Sensitive     CEFTAZIDIME >=64 RESISTANT Resistant     CEFTRIAXONE >=64 RESISTANT Resistant     CIPROFLOXACIN <=0.25 SENSITIVE Sensitive     GENTAMICIN <=1 SENSITIVE Sensitive     IMIPENEM <=0.25 SENSITIVE Sensitive     PIP/TAZO >=128 RESISTANT Resistant     TOBRAMYCIN <=1 SENSITIVE Sensitive     TRIMETH/SULFA <=20 SENSITIVE Sensitive     * MODERATE ENTEROBACTER AEROGENES  MRSA PCR Screening     Status: None   Collection Time: 10/09/14  4:04 PM  Result Value Ref Range Status   MRSA by PCR NEGATIVE NEGATIVE Final    Comment:        The GeneXpert MRSA Assay (FDA approved for NASAL specimens only), is one component of a comprehensive MRSA colonization surveillance program. It is not intended to diagnose MRSA infection nor to guide or monitor treatment for MRSA infections.      Studies:  Recent x-ray studies have been reviewed in detail by the Attending Physician  Scheduled Meds:  Scheduled Meds: . sodium chloride   Intravenous Once  . arformoterol  15 mcg Nebulization BID  . budesonide (PULMICORT) nebulizer solution  0.5 mg Nebulization BID  . cloBAZam  6.25 mg Per Tube 3 times per day  . diphenhydrAMINE  25 mg Intravenous Daily  . feeding supplement (PRO-STAT SUGAR FREE 64)  30 mL Per Tube BID  . filgrastim  480 mcg  Subcutaneous Daily  . folic acid  1 mg Intravenous Daily  . free water  180 mL Per Tube Q4H  . gabapentin  900 mg Per Tube 3 times per day  . guaiFENesin  30 mL Per Tube BID AC  . insulin aspart  0-9 Units Subcutaneous 6 times per day  . levalbuterol  0.63 mg Nebulization Q6H  . metoCLOPramide  10 mg Per Tube TID AC & HS  . multivitamin  10 mL Per Tube Daily  . omeprazole  20 mg Oral BID  . potassium chloride  10 mEq Intravenous Q1 Hr x 4  . Pseudoephedrine HCl  30 mg Per J Tube 3 times per day  . sodium  chloride  3 mL Intravenous Q12H  . sucralfate  1 g Per Tube Q12H  . thiamine  100 mg Intravenous Daily  . TWOCAL HN  545 mL Per Tube Q24H  . vancomycin  750 mg Intravenous Q12H    Time spent on care of this patient: 40 mins   Scheryl Sanborn, Geraldo Docker , MD  [facility] Office  [phone] Pager - [phone]  On-Call/Text Page:      [email or web address]      password [email or web address]  If 7PM-7AM, please contact night-coverage [email or web address] Password [email or web address] 10/11/2014, 12:53 PM   LOS: 2 days   Care during the described time interval was provided by me .  I have reviewed this patient's available data, including medical history, events of note, physical examination, and all test results as part of my evaluation. I have personally reviewed and interpreted all radiology studies.   Dia Crawford, MD [phone] Pager

## 2014-10-11 NOTE — Progress Notes (Signed)
RT Note: Rt paged MD regarding patients family refusing CPT for different reasons. Rt was inquiring as to whether Dr. Sherral Hammers wanted to discontinue the therapy. He requests that we continue to offer the therapy to the patient, explaining the therapy's benefits and then allow them to decide if they would like him to have the therapy. Rt will continue to monitor.

## 2014-10-11 NOTE — Progress Notes (Signed)
ANTIBIOTIC CONSULT NOTE - INITIAL  Pharmacy Consult for Levaquin Indication: enterobacter pna  Allergies  Allergen Reactions  . Depakote [Divalproex Sodium]     Causes pancreatitis   . Vimpat [Lacosamide] Rash  . Keppra [Levetiracetam] Other (See Comments)    Bone marrow suppression  . Adhesive [Tape] Other (See Comments)    Rips skin off (paper tape is ok)  . Amoxicillin-Pot Clavulanate Rash    If given with fluconazole rash is not as severe or does not appear at all  . Neulasta [Pegfilgrastim] Other (See Comments)    Fever, tachycardia    Patient Measurements: Height: 4\' 2"  (127 cm) Weight: 138 lb 7.2 oz (62.8 kg) IBW/kg (Calculated) : 27  Vital Signs: Temp: 99.3 F (37.4 C) (09/09 1251) Temp Source: Axillary (09/09 1251) BP: 122/71 mmHg (09/09 1251) Pulse Rate: 107 (09/09 1251) Intake/Output from previous day: 09/08 0701 - 09/09 0700 In: 3080 [I.V.:2660; IV Piggyback:350] Out: 475 [Urine:300] Intake/Output from this shift:    Labs:  Recent Labs  10/09/14 0737 10/10/14 0350 10/10/14 0748 10/10/14 1606 10/11/14 0350 10/11/14 0913  WBC 5.1  --  1.3*  --  3.6*  --   HGB 12.4*  --  8.6*  --  7.6*  --   PLT 171  --  130*  --  109*  --   CREATININE 0.99 1.01  --  0.78  --  0.82   Estimated Creatinine Clearance: 81.1 mL/min (by C-G formula based on Cr of 0.82). No results for input(s): VANCOTROUGH, VANCOPEAK, VANCORANDOM, GENTTROUGH, GENTPEAK, GENTRANDOM, TOBRATROUGH, TOBRAPEAK, TOBRARND, AMIKACINPEAK, AMIKACINTROU, AMIKACIN in the last 72 hours.   Microbiology: Recent Results (from the past 720 hour(s))  Blood Culture (routine x 2)     Status: None (Preliminary result)   Collection Time: 10/09/14  7:37 AM  Result Value Ref Range Status   Specimen Description BLOOD PORTA CATH  Final   Special Requests BOTTLES DRAWN AEROBIC AND ANAEROBIC 5CC  Final   Culture NO GROWTH 2 DAYS  Final   Report Status PENDING  Incomplete  Urine culture     Status: None   Collection Time: 10/09/14  7:41 AM  Result Value Ref Range Status   Specimen Description URINE, CATHETERIZED  Final   Special Requests NONE  Final   Culture MULTIPLE SPECIES PRESENT, SUGGEST RECOLLECTION  Final   Report Status 10/10/2014 FINAL  Final  Blood Culture (routine x 2)     Status: None (Preliminary result)   Collection Time: 10/09/14  7:57 AM  Result Value Ref Range Status   Specimen Description BLOOD HAND LEFT  Final   Special Requests BOTTLES DRAWN AEROBIC ONLY 5CC  Final   Culture NO GROWTH 2 DAYS  Final   Report Status PENDING  Incomplete  Culture, respiratory (NON-Expectorated)     Status: None   Collection Time: 10/09/14  2:00 PM  Result Value Ref Range Status   Specimen Description TRACHEAL ASPIRATE  Final   Special Requests Normal  Final   Gram Stain   Final    RARE WBC PRESENT, PREDOMINANTLY MONONUCLEAR RARE SQUAMOUS EPITHELIAL CELLS PRESENT NO ORGANISMS SEEN Performed at [facility]    Culture   Final    MODERATE ENTEROBACTER AEROGENES Performed at [facility]    Report Status 10/11/2014 FINAL  Final   Organism ID, Bacteria ENTEROBACTER AEROGENES  Final      Susceptibility   Enterobacter aerogenes - MIC*    CEFAZOLIN >=64 RESISTANT Resistant     CEFEPIME <=1  SENSITIVE Sensitive     CEFTAZIDIME >=64 RESISTANT Resistant     CEFTRIAXONE >=64 RESISTANT Resistant     CIPROFLOXACIN <=0.25 SENSITIVE Sensitive     GENTAMICIN <=1 SENSITIVE Sensitive     IMIPENEM <=0.25 SENSITIVE Sensitive     PIP/TAZO >=128 RESISTANT Resistant     TOBRAMYCIN <=1 SENSITIVE Sensitive     TRIMETH/SULFA <=20 SENSITIVE Sensitive     * MODERATE ENTEROBACTER AEROGENES  MRSA PCR Screening     Status: None   Collection Time: 10/09/14  4:04 PM  Result Value Ref Range Status   MRSA by PCR NEGATIVE NEGATIVE Final    Comment:        The GeneXpert MRSA Assay (FDA approved for NASAL specimens only), is one component of a comprehensive MRSA colonization surveillance  program. It is not intended to diagnose MRSA infection nor to guide or monitor treatment for MRSA infections.     Medical History: Past Medical History  Diagnosis Date  . CP (cerebral palsy), spastic, quadriplegic   . Osteoporosis   . Undescended testes   . Seasonal allergies   . IVH (intraventricular hemorrhage) 01/06/1991    Grade IV  . Hip dislocation, bilateral   . Dysphagia   . Retinopathy of prematurity   . 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 Neuromuscular scoliosis   . Osteoporosis   . Complex partial seizures   . Generalized convulsive epilepsy without mention of intractable epilepsy   . Sinus bradycardia     HR drops to 38-40 while sleeping  . Blister of right heel     fluid filled; origin unknown  . Kidney stones     ?  Marland Kitchen Pneumonia      chronic pneumonia ,respitory failure dx Augest 2014  . Aspiration pneumonia     "chronic" (04/12/2014)  . OSA treated with BiPAP     "since age 74"   . Anemia   . 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 GERD (gastroesophageal reflux disease)   . Epilepsy   . Spastic quadriplegia   . Neutropenia 07/03/2014    Medications:  Scheduled:  . sodium chloride   Intravenous Once  . arformoterol  15 mcg Nebulization BID  . budesonide (PULMICORT) nebulizer solution  0.5 mg Nebulization BID  . cloBAZam  6.25 mg Per Tube 3 times per day  . diphenhydrAMINE  25 mg Intravenous Daily  . feeding supplement (PRO-STAT SUGAR FREE 64)  30 mL Per Tube BID  . filgrastim  480 mcg Subcutaneous Daily  . folic acid  1 mg Intravenous Daily  . free water  180 mL Per Tube Q4H  . gabapentin  900 mg Per Tube 3 times per day  . guaiFENesin  30 mL Per Tube BID AC  . insulin aspart  0-9 Units Subcutaneous 6 times per day  . levalbuterol  0.63 mg Nebulization Q6H  . metoCLOPramide  10 mg Per Tube TID AC & HS  . multivitamin  10 mL Per Tube Daily  . omeprazole  20 mg Oral BID  .  potassium chloride  10 mEq Intravenous Q1 Hr x 4  . Pseudoephedrine HCl  30 mg Per J Tube 3 times per day  . sodium chloride  3 mL Intravenous Q12H  . sucralfate  1 g Per Tube Q12H  . thiamine  100 mg Intravenous Daily  . TWOCAL HN  545 mL Per Tube Q24H  . vancomycin  750 mg Intravenous Q12H   Assessment: 24 yo M with cerebral palsy.  Admitted with fever and suspected PNA.  Initially started on Vancomycin and Zosyn.  Respiratory cx = enterobacter resistant to Zosyn and abx changed to Levaquin.  Goal of Therapy:  Renal dose adjustment of antibiotics  Plan:  Levaquin 750 mg IV q24h Continue to follow-up clinical course Consider discontinue Vancomycin based on cx data.  [employer], Pharm.D., BCPS Clinical Pharmacist Pager [phone] 10/11/2014 1:11 PM

## 2014-10-11 NOTE — Progress Notes (Signed)
Mom stated that patient did great with CPT today up in chair. When asked about CPT tonight she did not want to do because he had increased edema and she did not want to do tonight. She felt he was tired and ready for BIPAP and would resume in the morning. RN in room. CPT not done for 20:00. Will resume in morning. BBS clear, vitals stable. RT will continue to monitor.

## 2014-10-11 NOTE — Progress Notes (Signed)
RT Note: RT performed chest physiotherapy with patient via chest vest while patient was up in his chair. Patients parents were both at his bedside throughout. Therapy was performed for 20 minutes on his home chest vest settings that were provided by his parents. Patient tolerated therapy very well. His Spo2 remained at 100% throughout the therapy. BBS continue to be clear on auscultation. Patients mother wanted to make sure that it is noted how well he tolerated the therapy and that he is in great spirits afterwards. RT will continue to perform the therapy as ordered. Patients mother is requesting that the patient is up in his wheelchair during all of his therapy sessions. Rt will continue to monitor.

## 2014-10-11 NOTE — Progress Notes (Signed)
CPT held due to portacath per family.

## 2014-10-12 DIAGNOSIS — G4733 Obstructive sleep apnea (adult) (pediatric): Secondary | ICD-10-CM

## 2014-10-12 LAB — MAGNESIUM: MAGNESIUM: 1.7 mg/dL (ref 1.7–2.4)

## 2014-10-12 LAB — CBC WITH DIFFERENTIAL/PLATELET
BASOS PCT: 0 % (ref 0–1)
Basophils Absolute: 0 10*3/uL (ref 0.0–0.1)
EOS ABS: 0.2 10*3/uL (ref 0.0–0.7)
Eosinophils Relative: 2 % (ref 0–5)
HCT: 28.8 % — ABNORMAL LOW (ref 39.0–52.0)
Hemoglobin: 9.3 g/dL — ABNORMAL LOW (ref 13.0–17.0)
LYMPHS ABS: 0.5 10*3/uL — AB (ref 0.7–4.0)
Lymphocytes Relative: 5 % — ABNORMAL LOW (ref 12–46)
MCH: 33.2 pg (ref 26.0–34.0)
MCHC: 32.3 g/dL (ref 30.0–36.0)
MCV: 102.9 fL — AB (ref 78.0–100.0)
MONO ABS: 0.7 10*3/uL (ref 0.1–1.0)
Monocytes Relative: 8 % (ref 3–12)
NEUTROS ABS: 7.6 10*3/uL (ref 1.7–7.7)
Neutrophils Relative %: 85 % — ABNORMAL HIGH (ref 43–77)
PLATELETS: 102 10*3/uL — AB (ref 150–400)
RBC: 2.8 MIL/uL — ABNORMAL LOW (ref 4.22–5.81)
RDW: 18.6 % — AB (ref 11.5–15.5)
WBC Morphology: INCREASED
WBC: 9 10*3/uL (ref 4.0–10.5)

## 2014-10-12 LAB — TYPE AND SCREEN
ABO/RH(D): O POS
ANTIBODY SCREEN: NEGATIVE
UNIT DIVISION: 0

## 2014-10-12 LAB — GLUCOSE, CAPILLARY
GLUCOSE-CAPILLARY: 86 mg/dL (ref 65–99)
GLUCOSE-CAPILLARY: 83 mg/dL (ref 65–99)
Glucose-Capillary: 101 mg/dL — ABNORMAL HIGH (ref 65–99)
Glucose-Capillary: 82 mg/dL (ref 65–99)
Glucose-Capillary: 100 mg/dL — ABNORMAL HIGH (ref 65–99)
Glucose-Capillary: 81 mg/dL (ref 65–99)
Glucose-Capillary: 78 mg/dL (ref 65–99)

## 2014-10-12 LAB — COMPREHENSIVE METABOLIC PANEL
ALT: 58 U/L (ref 17–63)
AST: 32 U/L (ref 15–41)
Albumin: 1.7 g/dL — ABNORMAL LOW (ref 3.5–5.0)
Alkaline Phosphatase: 98 U/L (ref 38–126)
Anion gap: 8 (ref 5–15)
BUN: 13 mg/dL (ref 6–20)
CHLORIDE: 108 mmol/L (ref 101–111)
CO2: 24 mmol/L (ref 22–32)
CREATININE: 0.78 mg/dL (ref 0.61–1.24)
Calcium: 8.1 mg/dL — ABNORMAL LOW (ref 8.9–10.3)
Glucose, Bld: 96 mg/dL (ref 65–99)
POTASSIUM: 3.2 mmol/L — AB (ref 3.5–5.1)
Sodium: 140 mmol/L (ref 135–145)
Total Bilirubin: 0.7 mg/dL (ref 0.3–1.2)
Total Protein: 4.3 g/dL — ABNORMAL LOW (ref 6.5–8.1)

## 2014-10-12 LAB — URINE CULTURE: Culture: NO GROWTH

## 2014-10-12 LAB — PHOSPHORUS: PHOSPHORUS: 3.9 mg/dL (ref 2.5–4.6)

## 2014-10-12 LAB — LACTIC ACID, PLASMA: Lactic Acid, Venous: 3.7 mmol/L (ref 0.5–2.0)

## 2014-10-12 MED ORDER — POTASSIUM CHLORIDE 10 MEQ/100ML IV SOLN
10.0000 meq | INTRAVENOUS | Status: AC
  Administered 2014-10-12 (×4): 10 meq via INTRAVENOUS
  Filled 2014-10-12 (×4): qty 100

## 2014-10-12 MED ORDER — SUCRALFATE 1 GM/10ML PO SUSP
1.0000 g | Freq: Three times a day (TID) | ORAL | Status: DC
  Administered 2014-10-12: 1 g
  Filled 2014-10-12 (×2): qty 10

## 2014-10-12 MED ORDER — SUCRALFATE 1 GM/10ML PO SUSP
1.0000 g | Freq: Three times a day (TID) | ORAL | Status: DC
  Filled 2014-10-12 (×2): qty 10

## 2014-10-12 MED ORDER — SUCRALFATE 1 GM/10ML PO SUSP
1.0000 g | Freq: Three times a day (TID) | ORAL | Status: DC
  Administered 2014-10-12 – 2014-10-20 (×21): 1 g
  Filled 2014-10-12 (×25): qty 10

## 2014-10-12 NOTE — Progress Notes (Signed)
Pt's father has decided to hold Chest Vest until the am.  RT to monitor and assess as needed.

## 2014-10-12 NOTE — Progress Notes (Signed)
CRITICAL VALUE ALERT  Critical value received:  Lactic acid = 3.9    Date of notification:  10/11/2014  Time of notification:  1908  Critical value read back:Yes.    Nurse who received alert:  K. Royden Purl  MD notified (1st page):  Fredirick Maudlin, NP  Time of first page:  1910  MD notified (2nd page):  Time of second page:  Responding MD:  Fredirick Maudlin, NP  Time MD responded:  1910  New orders received for 250 cc NS bolus. Will carry out orders and continue to monitor.

## 2014-10-12 NOTE — Progress Notes (Signed)
[facility] TEAM 1 - Stepdown/ICU TEAM Progress Note  Jeffery [name] EXH:[id number] DOB: 1990-07-03 DOA: 10/09/2014 PCP: Marijean Bravo, MD  Admit HPI / Brief Narrative: 24 y.o. WM PMHx cerebral palsy, spastic quadriplegia, wheelchair mobile, seizure disorder, has Port-A-Cath, PEG tube, hx of pancytopenia attributed to [place] being managed by oncology with transfusions as needed as well as Neulasta, obstructive sleep apnea on BiPAP daily at bedtime, history of recurrent aspiration pneumonia, status post PEG/J-tube placement presented to the ED with worsening shortness of breath over the past week requiring 02, respiratory distress. Approximate 1 week prior to admission patient was diagnosed with a right middle and right lower lobe pneumonia treated in the outpatient setting by Dr. Marin Olp (oncology) with IM Rocephin 2 and subsequently on oral cefdinir with no significant improvement. Patient presented with increased work of breathing, productive cough of thickened secretions, fever with temperature as high as 105 rectally. Patient was noted to be septic with a fever, tachycardic with heart rate in the 140s, and increased respiratory rate as high as 45. Chest x-ray done actually did show some improvement from prior chest x-ray.  HPI/Subjective: 9/10 afebrile overnight, alert sitting in chair appears comfortable, spontaneously opens eyes  Assessment/Plan: Acute respiratory distress with Hypoxia -Likely secondary to partially treated HCAP vs Aspiration pneumonia.  -Lactic acid elevated. Sepsis protocol in place.  -Trach aspirate positive Enterobacter resistant to Zosyn  -O2/BiPAP as needed -Continue Physiotherapy with vibrator; avoid his Port-A-Cath right chest wall -Continue Brovana and Pulmicort nebulizer BID -Continue guaifenesin 600 mg BID -DC Zosyn and started on Levaquin -NTS PRN -Decrease D5-0.45% saline to 50 ml/hr (monitor for fluid overload) -Strict in and out + 6.7 L  HCAP +/-  aspiration pneumonia -9/9 CXR; worsening RLL atelectasis/pneumonia  -Tracheal aspirate positive GNR;(ENTEROBACTER AEROGENES), resistant to Zosyn, DC Zosyn and start levofloxacin  Sepsis likely secondary to probable pneumonia, UTI? -Repeat CXR; worsening atelectasis/pneumonia RLL -9/9 Repeat urine culture negative .  Lactic acidosis -See acute respiratory distress  dysphagia status post PEG/ J tube placement -When not on BiPAP resume tube feeds   seizures/generalized convulsive epilepsy -9/8 Tonic/clonic seizures 2 witnessed today. Most likely secondary to patient's continued elevated temperature  -Continue cooling blanket maintain body temp 37C -Seizure protocol -Continue Diastat rectal kit (NOTE Versed CAN NOT be used in conjunction with this medication)  -Continue CloBazam (Onfi) 6.25 mg TID.  Cerebral Palsy/spastic quadriplegic -Continue baclofen pump. -Patient receiving Botox injections every 3 months per neurology area outpatient follow-up.  Pancytopenia -Being seen by his oncologist Dr. Marin Olp. -S/P 2 units PRBC last week as outpatient  -Continue Neupogen 480 g daily . -9/9 will transfuse 1 unit PRBC  Hypokalemia -Potassium IV 10 mEq 4 runs   Code Status: DO NOT RESUSCITATE Family Communication: Father present at time of exam Disposition Plan: Resolution sepsis    Consultants: Dr. Marin Olp (oncology)   Procedure/Significant Events: S/P 2 units PRBC last week as outpatient  -9/9 will transfuse 1 unit PRBC   Culture 9/7 blood Porta cath/left hand NGTD 9/7 urine positive multiple species  9/7 trach aspirate positive GNR (ENTEROBACTER AEROGENES), resistant to Zosyn 9/7 MRSA by PCR negative 9/7 strep pneumo urine antigen/Legionella urine antigen negative 9/9 urine negative   Antibiotics: Zosyn 9/7>> stopped 9/9 Vancomycin 9/7 >> Levofloxacin 9/9>>  DVT prophylaxis: SCD   Devices    LINES / TUBES:      Continuous Infusions: . baclofen     . dextrose 5 % and 0.45% NaCl 150 mL/hr at 10/12/14 0522  Objective: VITAL SIGNS: Temp: 98.2 F (36.8 C) (09/10 0333) Temp Source: Oral (09/10 0337) BP: 120/67 mmHg (09/10 0337) Pulse Rate: 98 (09/10 0429) SPO2; FIO2:   Intake/Output Summary (Last 24 hours) at 10/12/14 0817 Last data filed at 10/12/14 0522  Gross per 24 hour  Intake   3112 ml  Output   2225 ml  Net    887 ml     Exam: General: alert sitting in chair appears comfortable, spontaneously opens eyes. does not follow commands negative acute respiratory distress Eyes: Pupils equal round reactive to light and accommodation, negative scleral hemorrhage ENT: Negative Runny nose, negative gingival bleeding, Neck:  Negative scars, masses, torticollis, lymphadenopathy, JVD Lungs: diffuse rhonchi Rt>>Lt, without wheezes or crackles Cardiovascular: Regular rhythm and rate without murmur gallop or rub normal S1 and S2 Abdomen:negative abdominal pain, nondistended, positive soft, bowel sounds, no rebound, no ascites, no appreciable mass, PEG tube in place negative sign of infection Extremities: bilateral lower extremity deformed extremities secondary to cerebral palsy; bilateral distal and hips secondary to cerebral palsy Psychiatric:  Unable to assess  Neurologic:  Withdraws to pain unable to complete assessment    Data Reviewed: Basic Metabolic Panel:  Recent Labs Lab 10/09/14 0737 10/10/14 0350 10/10/14 1029 10/10/14 1606 10/11/14 0913 10/12/14 0340  NA 145 142  --  144 140 140  K 4.0 3.3*  --  3.6 3.1* 3.2*  CL 104 104  --  111 107 108  CO2 31 29  --  30 26 24   GLUCOSE 114* 115*  --  99 97 96  BUN 50* 38*  --  27* 19 13  CREATININE 0.99 1.01  --  0.78 0.82 0.78  CALCIUM 8.4* 8.0*  --  7.9* 8.0* 8.1*  MG  --   --  2.2 2.3  --  1.7  PHOS  --   --   --   --   --  3.9   Liver Function Tests:  Recent Labs Lab 10/09/14 0737 10/10/14 0350 10/11/14 0913 10/12/14 0340  AST 123* 68* 32 32  ALT 148*  114* 66* 58  ALKPHOS 140* 106 84 98  BILITOT 0.4 0.8 0.8 0.7  PROT 5.5* 4.8* 4.5* 4.3*  ALBUMIN 2.3* 2.0* 1.9* 1.7*   No results for input(s): LIPASE, AMYLASE in the last 168 hours. No results for input(s): AMMONIA in the last 168 hours. CBC:  Recent Labs Lab 10/09/14 0737 10/10/14 0748 10/11/14 0350 10/11/14 1815 10/12/14 0340  WBC 5.1 1.3* 3.6*  --  9.0  NEUTROABS 3.9 0.8* 2.6  --  7.6  HGB 12.4* 8.6* 7.6* 10.0* 9.3*  HCT 41.0 28.2* 24.5* 32.1* 28.8*  MCV 108.2* 106.8* 107.5*  --  102.9*  PLT 171 130* 109*  --  102*   Cardiac Enzymes: No results for input(s): CKTOTAL, CKMB, CKMBINDEX, TROPONINI in the last 168 hours. BNP (last 3 results) No results for input(s): BNP in the last 8760 hours.  ProBNP (last 3 results) No results for input(s): PROBNP in the last 8760 hours.  CBG:  Recent Labs Lab 10/11/14 1652 10/11/14 1937 10/11/14 2330 10/12/14 0121 10/12/14 0338  GLUCAP 85 88 82 101* 86    Recent Results (from the past 240 hour(s))  Blood Culture (routine x 2)     Status: None (Preliminary result)   Collection Time: 10/09/14  7:37 AM  Result Value Ref Range Status   Specimen Description BLOOD PORTA CATH  Final   Special Requests BOTTLES DRAWN AEROBIC AND ANAEROBIC 5CC  Final   Culture NO GROWTH 2 DAYS  Final   Report Status PENDING  Incomplete  Urine culture     Status: None   Collection Time: 10/09/14  7:41 AM  Result Value Ref Range Status   Specimen Description URINE, CATHETERIZED  Final   Special Requests NONE  Final   Culture MULTIPLE SPECIES PRESENT, SUGGEST RECOLLECTION  Final   Report Status 10/10/2014 FINAL  Final  Blood Culture (routine x 2)     Status: None (Preliminary result)   Collection Time: 10/09/14  7:57 AM  Result Value Ref Range Status   Specimen Description BLOOD HAND LEFT  Final   Special Requests BOTTLES DRAWN AEROBIC ONLY 5CC  Final   Culture NO GROWTH 2 DAYS  Final   Report Status PENDING  Incomplete  Culture, respiratory  (NON-Expectorated)     Status: None   Collection Time: 10/09/14  2:00 PM  Result Value Ref Range Status   Specimen Description TRACHEAL ASPIRATE  Final   Special Requests Normal  Final   Gram Stain   Final    RARE WBC PRESENT, PREDOMINANTLY MONONUCLEAR RARE SQUAMOUS EPITHELIAL CELLS PRESENT NO ORGANISMS SEEN Performed at [facility]    Culture   Final    MODERATE ENTEROBACTER AEROGENES Performed at [facility]    Report Status 10/11/2014 FINAL  Final   Organism ID, Bacteria ENTEROBACTER AEROGENES  Final      Susceptibility   Enterobacter aerogenes - MIC*    CEFAZOLIN >=64 RESISTANT Resistant     CEFEPIME <=1 SENSITIVE Sensitive     CEFTAZIDIME >=64 RESISTANT Resistant     CEFTRIAXONE >=64 RESISTANT Resistant     CIPROFLOXACIN <=0.25 SENSITIVE Sensitive     GENTAMICIN <=1 SENSITIVE Sensitive     IMIPENEM <=0.25 SENSITIVE Sensitive     PIP/TAZO >=128 RESISTANT Resistant     TOBRAMYCIN <=1 SENSITIVE Sensitive     TRIMETH/SULFA <=20 SENSITIVE Sensitive     * MODERATE ENTEROBACTER AEROGENES  MRSA PCR Screening     Status: None   Collection Time: 10/09/14  4:04 PM  Result Value Ref Range Status   MRSA by PCR NEGATIVE NEGATIVE Final    Comment:        The GeneXpert MRSA Assay (FDA approved for NASAL specimens only), is one component of a comprehensive MRSA colonization surveillance program. It is not intended to diagnose MRSA infection nor to guide or monitor treatment for MRSA infections.   Culture, Urine     Status: None (Preliminary result)   Collection Time: 10/11/14 11:40 AM  Result Value Ref Range Status   Specimen Description URINE, RANDOM  Final   Special Requests NONE  Final   Culture NO GROWTH < 24 HOURS  Final   Report Status PENDING  Incomplete     Studies:  Recent x-ray studies have been reviewed in detail by the Attending Physician  Scheduled Meds:  Scheduled Meds: . arformoterol  15 mcg Nebulization BID  . budesonide (PULMICORT)  nebulizer solution  0.5 mg Nebulization BID  . cloBAZam  6.25 mg Per Tube 3 times per day  . diphenhydrAMINE  25 mg Intravenous Daily  . feeding supplement (PRO-STAT SUGAR FREE 64)  30 mL Per Tube BID  . filgrastim  480 mcg Subcutaneous Daily  . folic acid  1 mg Intravenous Daily  . free water  180 mL Per Tube Q4H  . gabapentin  900 mg Per Tube 3 times per day  . guaiFENesin  30 mL Per  Tube BID AC  . insulin aspart  0-9 Units Subcutaneous 6 times per day  . levalbuterol  0.63 mg Nebulization Q6H  . levofloxacin (LEVAQUIN) IV  750 mg Intravenous Q24H  . metoCLOPramide  10 mg Per Tube TID AC & HS  . multivitamin  10 mL Per Tube Daily  . omeprazole  20 mg Oral BID  . Pseudoephedrine HCl  30 mg Per J Tube 3 times per day  . sodium chloride  3 mL Intravenous Q12H  . sucralfate  1 g Per Tube Q12H  . thiamine  100 mg Intravenous Daily  . TWOCAL HN  545 mL Per Tube Q24H  . vancomycin  750 mg Intravenous Q12H    Time spent on care of this patient: 40 mins   Adrijana Haros, Geraldo Docker , MD  [facility] Office  [phone] Pager - [phone]  On-Call/Text Page:      [email or web address]      password [email or web address]  If 7PM-7AM, please contact night-coverage [email or web address] Password [email or web address] 10/12/2014, 8:17 AM   LOS: 3 days   Care during the described time interval was provided by me .  I have reviewed this patient's available data, including medical history, events of note, physical examination, and all test results as part of my evaluation. I have personally reviewed and interpreted all radiology studies.   Dia Crawford, MD [phone] Pager

## 2014-10-13 ENCOUNTER — Inpatient Hospital Stay (HOSPITAL_COMMUNITY): Payer: Medicaid Other

## 2014-10-13 DIAGNOSIS — G40309 Generalized idiopathic epilepsy and epileptic syndromes, not intractable, without status epilepticus: Secondary | ICD-10-CM

## 2014-10-13 DIAGNOSIS — R6520 Severe sepsis without septic shock: Secondary | ICD-10-CM

## 2014-10-13 LAB — GLUCOSE, CAPILLARY
GLUCOSE-CAPILLARY: 81 mg/dL (ref 65–99)
GLUCOSE-CAPILLARY: 100 mg/dL — AB (ref 65–99)
GLUCOSE-CAPILLARY: 95 mg/dL (ref 65–99)
Glucose-Capillary: 107 mg/dL — ABNORMAL HIGH (ref 65–99)
Glucose-Capillary: 73 mg/dL (ref 65–99)
Glucose-Capillary: 93 mg/dL (ref 65–99)

## 2014-10-13 LAB — COMPREHENSIVE METABOLIC PANEL
ALBUMIN: 1.6 g/dL — AB (ref 3.5–5.0)
ALK PHOS: 114 U/L (ref 38–126)
ALT: 55 U/L (ref 17–63)
ANION GAP: 6 (ref 5–15)
AST: 32 U/L (ref 15–41)
BILIRUBIN TOTAL: 0.6 mg/dL (ref 0.3–1.2)
BUN: 10 mg/dL (ref 6–20)
CALCIUM: 8.3 mg/dL — AB (ref 8.9–10.3)
CO2: 25 mmol/L (ref 22–32)
Chloride: 110 mmol/L (ref 101–111)
Creatinine, Ser: 0.82 mg/dL (ref 0.61–1.24)
GFR calc Af Amer: >60 mL/min (ref >60)
GLUCOSE: 79 mg/dL (ref 65–99)
POTASSIUM: 3.7 mmol/L (ref 3.5–5.1)
Sodium: 141 mmol/L (ref 135–145)
TOTAL PROTEIN: 4.1 g/dL — AB (ref 6.5–8.1)

## 2014-10-13 LAB — CBC WITH DIFFERENTIAL/PLATELET
BASOS PCT: 0 % (ref 0–1)
Basophils Absolute: 0 10*3/uL (ref 0.0–0.1)
EOS PCT: 3 % (ref 0–5)
Eosinophils Absolute: 0.2 10*3/uL (ref 0.0–0.7)
HEMATOCRIT: 28.4 % — AB (ref 39.0–52.0)
HEMOGLOBIN: 9 g/dL — AB (ref 13.0–17.0)
LYMPHS ABS: 0.5 10*3/uL — AB (ref 0.7–4.0)
Lymphocytes Relative: 7 % — ABNORMAL LOW (ref 12–46)
MCH: 32.8 pg (ref 26.0–34.0)
MCHC: 31.7 g/dL (ref 30.0–36.0)
MCV: 103.6 fL — AB (ref 78.0–100.0)
MONO ABS: 0.8 10*3/uL (ref 0.1–1.0)
MONOS PCT: 10 % (ref 3–12)
NEUTROS ABS: 6.2 10*3/uL (ref 1.7–7.7)
Neutrophils Relative %: 80 % — ABNORMAL HIGH (ref 43–77)
Platelets: 105 10*3/uL — ABNORMAL LOW (ref 150–400)
RBC: 2.74 MIL/uL — AB (ref 4.22–5.81)
RDW: 18.1 % — AB (ref 11.5–15.5)
WBC Morphology: INCREASED
WBC: 7.7 10*3/uL (ref 4.0–10.5)

## 2014-10-13 LAB — LACTIC ACID, PLASMA
LACTIC ACID, VENOUS: 4.7 mmol/L — AB (ref 0.5–2.0)
LACTIC ACID, VENOUS: 4.7 mmol/L — AB (ref 0.5–2.0)

## 2014-10-13 LAB — MAGNESIUM: Magnesium: 1.8 mg/dL (ref 1.7–2.4)

## 2014-10-13 MED ORDER — DEXTROSE 5 % AND 0.45 % NACL IV BOLUS
1000.0000 mL | Freq: Once | INTRAVENOUS | Status: AC
  Administered 2014-10-13: 1000 mL via INTRAVENOUS

## 2014-10-13 MED ORDER — ALBUMIN HUMAN 25 % IV SOLN
75.0000 g | Freq: Once | INTRAVENOUS | Status: AC
  Administered 2014-10-13: 75 g via INTRAVENOUS
  Filled 2014-10-13: qty 300

## 2014-10-13 MED ORDER — MAGNESIUM SULFATE 2 GM/50ML IV SOLN
2.0000 g | Freq: Once | INTRAVENOUS | Status: AC
  Administered 2014-10-13: 2 g via INTRAVENOUS
  Filled 2014-10-13: qty 50

## 2014-10-13 NOTE — Progress Notes (Signed)
[place] TEAM 1 - Stepdown/ICU TEAM Progress Note  Jeffery [name] GEZ:[id number] DOB: September 06, 1990 DOA: 10/09/2014 PCP: Marijean Bravo, MD  Admit HPI / Brief Narrative: 24 y.o. WM PMHx cerebral palsy, spastic quadriplegia, wheelchair mobile, seizure disorder, has Port-A-Cath, PEG tube, hx of pancytopenia attributed to [place] being managed by oncology with transfusions as needed as well as Neulasta, obstructive sleep apnea on BiPAP daily at bedtime, history of recurrent aspiration pneumonia, status post PEG/J-tube placement presented to the ED with worsening shortness of breath over the past week requiring 02, respiratory distress. Approximate 1 week prior to admission patient was diagnosed with a right middle and right lower lobe pneumonia treated in the outpatient setting by Dr. Marin Olp (oncology) with IM Rocephin 2 and subsequently on oral cefdinir with no significant improvement. Patient presented with increased work of breathing, productive cough of thickened secretions, fever with temperature as high as 105 rectally. Patient was noted to be septic with a fever, tachycardic with heart rate in the 140s, and increased respiratory rate as high as 45. Chest x-ray done actually did show some improvement from prior chest x-ray.  HPI/Subjective: 9/11 afebrile overnight, alert sitting in chair appears comfortable, spontaneously opens eyes  Assessment/Plan: Acute respiratory distress with Hypoxia -Likely secondary to partially treated HCAP vs Aspiration pneumonia.  -Lactic acid elevated. Sepsis protocol in place.  -Trach aspirate positive Enterobacter resistant to Zosyn  -O2/BiPAP as needed -Continue Physiotherapy with vibrator; avoid his Port-A-Cath right chest wall -Continue Brovana and Pulmicort nebulizer BID -Continue guaifenesin 600 mg BID -Continue Levaquin -NTS PRN -9/11Upon completion of albumin 75 gm + magnesium 2 gm bolus D5-0.45% saline 1 L ; then 75 ml/hr (monitor for fluid  overload) -Strict in and out + 7.7 L  HCAP +/- aspiration pneumonia -9/9 CXR; worsening RLL atelectasis/pneumonia  -Tracheal aspirate positive GNR;(ENTEROBACTER AEROGENES), resistant to Zosyn,  -Continue Levofloxacin  Severe Sepsis likely secondary to probable pneumonia -Repeat CXR; worsening atelectasis/pneumonia RLL -Increasing lactic acidosis.  -9/11 Panculture, obtain PCXR  Lactic acidosis -9/11 NOTE; this a.m. lactic acid not resulted, had to contact lab 2 times in order to obtain requested a.m. lactic acid. - See acute respiratory distress  Hypothermia -Bear hugger;  maintain body temp 37C  dysphagia status post PEG/ J tube placement -When not on BiPAP resume tube feeds   seizures/generalized convulsive epilepsy -9/8 Tonic/clonic seizures 2 witnessed today. Most likely secondary to patient's continued elevated temperature  -Seizure protocol -Continue Diastat rectal kit (NOTE Versed CAN NOT be used in conjunction with this medication)  -Continue CloBazam (Onfi) 6.25 mg TID.  Cerebral Palsy/spastic quadriplegic -Continue baclofen pump. -Patient receiving Botox injections every 3 months per neurology area outpatient follow-up.  Pancytopenia -Being seen by his oncologist Dr. Marin Olp. -S/P 2 units PRBC last week as outpatient  -Continue Neupogen 480 g daily . -9/9 will transfuse 1 unit PRBC  Hypokalemia -WNL   Code Status: DO NOT RESUSCITATE Family Communication: Father present at time of exam Disposition Plan: Resolution sepsis    Consultants: Dr. Marin Olp (oncology)   Procedure/Significant Events: S/P 2 units PRBC last week as outpatient  -9/9 will transfuse 1 unit PRBC   Culture 9/7 blood Porta cath/left hand NGTD 9/7 urine positive multiple species  9/7 trach aspirate positive GNR (ENTEROBACTER AEROGENES), resistant to Zosyn 9/7 MRSA by PCR negative 9/7 strep pneumo urine antigen/Legionella urine antigen negative 9/9 urine  negative   Antibiotics: Zosyn 9/7>> stopped 9/9 Vancomycin 9/7 >>stopped 9/10 Levofloxacin 9/9>>   DVT prophylaxis: SCD   Devices  LINES / TUBES:      Continuous Infusions: . baclofen    . dextrose 5 % and 0.45% NaCl 50 mL/hr at 10/12/14 0945    Objective: VITAL SIGNS: Temp: 98.3 F (36.8 C) (09/11 0700) Temp Source: Axillary (09/11 0700) BP: 94/52 mmHg (09/11 0736) Pulse Rate: 88 (09/11 0749) SPO2; FIO2:   Intake/Output Summary (Last 24 hours) at 10/13/14 0928 Last data filed at 10/13/14 0800  Gross per 24 hour  Intake   1600 ml  Output    775 ml  Net    825 ml     Exam: General: alert sitting in chair appears comfortable, spontaneously opens eyes. does not follow commands negative acute respiratory distress Eyes: Pupils equal round reactive to light and accommodation, negative scleral hemorrhage ENT: Negative Runny nose, negative gingival bleeding, Neck:  Negative scars, masses, torticollis, lymphadenopathy, JVD Lungs: diffuse rhonchi Rt>>Lt, without wheezes or crackles Cardiovascular: Regular rhythm and rate without murmur gallop or rub normal S1 and S2 Abdomen:negative abdominal pain, nondistended, positive soft, bowel sounds, no rebound, no ascites, no appreciable mass, PEG tube in place negative sign of infection Extremities: bilateral lower extremity deformed extremities secondary to cerebral palsy; bilateral distal and hips secondary to cerebral palsy Psychiatric:  Unable to assess  Neurologic:  Withdraws to pain unable to complete assessment    Data Reviewed: Basic Metabolic Panel:  Recent Labs Lab 10/10/14 0350 10/10/14 1029 10/10/14 1606 10/11/14 0913 10/12/14 0340 10/13/14 0345  NA 142  --  144 140 140 141  K 3.3*  --  3.6 3.1* 3.2* 3.7  CL 104  --  111 107 108 110  CO2 29  --  _0 GLUCOSE 115*  --  99 97 96 79  BUN 38*  --  27* _1 CREATININE 1.01  --  0.78 0.82 0.78 0.82  CALCIUM 8.0*  --  7.9* 8.0* 8.1* 8.3*   MG  --  2.2 2.3  --  1.7 1.8  PHOS  --   --   --   --  3.9  --    Liver Function Tests:  Recent Labs Lab 10/09/14 0737 10/10/14 0350 10/11/14 0913 10/12/14 0340 10/13/14 0345  AST 123* 68* 32 32 32  ALT 148* 114* 66* 58 55  ALKPHOS 140* 106 84 98 114  BILITOT 0.4 0.8 0.8 0.7 0.6  PROT 5.5* 4.8* 4.5* 4.3* 4.1*  ALBUMIN 2.3* 2.0* 1.9* 1.7* 1.6*   No results for input(s): LIPASE, AMYLASE in the last 168 hours. No results for input(s): AMMONIA in the last 168 hours. CBC:  Recent Labs Lab 10/09/14 0737 10/10/14 0748 10/11/14 0350 10/11/14 1815 10/12/14 0340 10/13/14 0345  WBC 5.1 1.3* 3.6*  --  9.0 7.7  NEUTROABS 3.9 0.8* 2.6  --  7.6 6.2  HGB 12.4* 8.6* 7.6* 10.0* 9.3* 9.0*  HCT 41.0 28.2* 24.5* 32.1* 28.8* 28.4*  MCV 108.2* 106.8* 107.5*  --  102.9* 103.6*  PLT 171 130* 109*  --  102* 105*   Cardiac Enzymes: No results for input(s): CKTOTAL, CKMB, CKMBINDEX, TROPONINI in the last 168 hours. BNP (last 3 results) No results for input(s): BNP in the last 8760 hours.  ProBNP (last 3 results) No results for input(s): PROBNP in the last 8760 hours.  CBG:  Recent Labs Lab 10/12/14 1659 10/12/14 1945 10/12/14 2307 10/13/14 0333 10/13/14 0738  GLUCAP 81 78 107* 81 73    Recent Results (from the past 240 hour(s))  Blood Culture (  routine x 2)     Status: None (Preliminary result)   Collection Time: 10/09/14  7:37 AM  Result Value Ref Range Status   Specimen Description BLOOD PORTA CATH  Final   Special Requests BOTTLES DRAWN AEROBIC AND ANAEROBIC 5CC  Final   Culture NO GROWTH 3 DAYS  Final   Report Status PENDING  Incomplete  Urine culture     Status: None   Collection Time: 10/09/14  7:41 AM  Result Value Ref Range Status   Specimen Description URINE, CATHETERIZED  Final   Special Requests NONE  Final   Culture MULTIPLE SPECIES PRESENT, SUGGEST RECOLLECTION  Final   Report Status 10/10/2014 FINAL  Final  Blood Culture (routine x 2)     Status: None  (Preliminary result)   Collection Time: 10/09/14  7:57 AM  Result Value Ref Range Status   Specimen Description BLOOD HAND LEFT  Final   Special Requests BOTTLES DRAWN AEROBIC ONLY 5CC  Final   Culture NO GROWTH 3 DAYS  Final   Report Status PENDING  Incomplete  Culture, respiratory (NON-Expectorated)     Status: None   Collection Time: 10/09/14  2:00 PM  Result Value Ref Range Status   Specimen Description TRACHEAL ASPIRATE  Final   Special Requests Normal  Final   Gram Stain   Final    RARE WBC PRESENT, PREDOMINANTLY MONONUCLEAR RARE SQUAMOUS EPITHELIAL CELLS PRESENT NO ORGANISMS SEEN Performed at [facility]    Culture   Final    MODERATE ENTEROBACTER AEROGENES Performed at [facility]    Report Status 10/11/2014 FINAL  Final   Organism ID, Bacteria ENTEROBACTER AEROGENES  Final      Susceptibility   Enterobacter aerogenes - MIC*    CEFAZOLIN >=64 RESISTANT Resistant     CEFEPIME <=1 SENSITIVE Sensitive     CEFTAZIDIME >=64 RESISTANT Resistant     CEFTRIAXONE >=64 RESISTANT Resistant     CIPROFLOXACIN <=0.25 SENSITIVE Sensitive     GENTAMICIN <=1 SENSITIVE Sensitive     IMIPENEM <=0.25 SENSITIVE Sensitive     PIP/TAZO >=128 RESISTANT Resistant     TOBRAMYCIN <=1 SENSITIVE Sensitive     TRIMETH/SULFA <=20 SENSITIVE Sensitive     * MODERATE ENTEROBACTER AEROGENES  MRSA PCR Screening     Status: None   Collection Time: 10/09/14  4:04 PM  Result Value Ref Range Status   MRSA by PCR NEGATIVE NEGATIVE Final    Comment:        The GeneXpert MRSA Assay (FDA approved for NASAL specimens only), is one component of a comprehensive MRSA colonization surveillance program. It is not intended to diagnose MRSA infection nor to guide or monitor treatment for MRSA infections.   Culture, Urine     Status: None   Collection Time: 10/11/14 11:40 AM  Result Value Ref Range Status   Specimen Description URINE, RANDOM  Final   Special Requests NONE  Final    Culture NO GROWTH 1 DAY  Final   Report Status 10/12/2014 FINAL  Final     Studies:  Recent x-ray studies have been reviewed in detail by the Attending Physician  Scheduled Meds:  Scheduled Meds: . arformoterol  15 mcg Nebulization BID  . budesonide (PULMICORT) nebulizer solution  0.5 mg Nebulization BID  . cloBAZam  6.25 mg Per Tube 3 times per day  . diphenhydrAMINE  25 mg Intravenous Daily  . feeding supplement (PRO-STAT SUGAR FREE 64)  30 mL Per Tube BID  . filgrastim  480 mcg Subcutaneous Daily  . folic acid  1 mg Intravenous Daily  . free water  180 mL Per Tube Q4H  . gabapentin  900 mg Per Tube 3 times per day  . guaiFENesin  30 mL Per Tube BID AC  . insulin aspart  0-9 Units Subcutaneous 6 times per day  . levalbuterol  0.63 mg Nebulization Q6H  . levofloxacin (LEVAQUIN) IV  750 mg Intravenous Q24H  . metoCLOPramide  10 mg Per Tube TID AC & HS  . multivitamin  10 mL Per Tube Daily  . omeprazole  20 mg Oral BID  . Pseudoephedrine HCl  30 mg Per J Tube 3 times per day  . sodium chloride  3 mL Intravenous Q12H  . sucralfate  1 g Per Tube TID BM  . thiamine  100 mg Intravenous Daily  . TWOCAL HN  545 mL Per Tube Q24H    Time spent on care of this patient: 40 mins   WOODS, Geraldo Docker , MD  [facility] Office  [phone] Pager - [phone]  On-Call/Text Page:      [email or web address]      password [email or web address]  If 7PM-7AM, please contact night-coverage [email or web address] Password [email or web address] 10/13/2014, 9:28 AM   LOS: 4 days   Care during the described time interval was provided by me .  I have reviewed this patient's available data, including medical history, events of note, physical examination, and all test results as part of my evaluation. I have personally reviewed and interpreted all radiology studies.   Dia Crawford, MD [phone] Pager

## 2014-10-13 NOTE — Progress Notes (Signed)
UR COMPLETED  

## 2014-10-13 NOTE — Progress Notes (Signed)
Pt BP in the 70's upon start of albumin. Temp dropped to 94.8 axillary. Rectal temp was 96.7. Notified Dr. Sherral Hammers. Received order to place bair hugger and give bolus after albumin is in. BP now up in the 100's post albumin. Bolus started and bair hugger placed. Will continue to monitor pt.

## 2014-10-14 ENCOUNTER — Inpatient Hospital Stay (HOSPITAL_COMMUNITY): Payer: Medicaid Other

## 2014-10-14 ENCOUNTER — Encounter (HOSPITAL_COMMUNITY): Payer: Self-pay

## 2014-10-14 LAB — COMPREHENSIVE METABOLIC PANEL
ALK PHOS: 89 U/L (ref 38–126)
ALT: 33 U/L (ref 17–63)
ANION GAP: 8 (ref 5–15)
AST: 22 U/L (ref 15–41)
Albumin: 2.6 g/dL — ABNORMAL LOW (ref 3.5–5.0)
BILIRUBIN TOTAL: 0.8 mg/dL (ref 0.3–1.2)
BUN: 12 mg/dL (ref 6–20)
CALCIUM: 8.1 mg/dL — AB (ref 8.9–10.3)
CO2: 23 mmol/L (ref 22–32)
CREATININE: 0.81 mg/dL (ref 0.61–1.24)
Chloride: 110 mmol/L (ref 101–111)
Glucose, Bld: 86 mg/dL (ref 65–99)
Potassium: 3.4 mmol/L — ABNORMAL LOW (ref 3.5–5.1)
Sodium: 141 mmol/L (ref 135–145)
TOTAL PROTEIN: 4.5 g/dL — AB (ref 6.5–8.1)

## 2014-10-14 LAB — CBC WITH DIFFERENTIAL/PLATELET
BASOS PCT: 0 % (ref 0–1)
Basophils Absolute: 0 10*3/uL (ref 0.0–0.1)
EOS ABS: 0.1 10*3/uL (ref 0.0–0.7)
EOS PCT: 3 % (ref 0–5)
HEMATOCRIT: 24.8 % — AB (ref 39.0–52.0)
HEMOGLOBIN: 7.8 g/dL — AB (ref 13.0–17.0)
LYMPHS PCT: 5 % — AB (ref 12–46)
Lymphs Abs: 0.2 10*3/uL — ABNORMAL LOW (ref 0.7–4.0)
MCH: 32.4 pg (ref 26.0–34.0)
MCHC: 31.5 g/dL (ref 30.0–36.0)
MCV: 102.9 fL — AB (ref 78.0–100.0)
Monocytes Absolute: 0.7 10*3/uL (ref 0.1–1.0)
Monocytes Relative: 16 % — ABNORMAL HIGH (ref 3–12)
NEUTROS ABS: 3.1 10*3/uL (ref 1.7–7.7)
NEUTROS PCT: 76 % (ref 43–77)
Platelets: 94 10*3/uL — ABNORMAL LOW (ref 150–400)
RBC: 2.41 MIL/uL — ABNORMAL LOW (ref 4.22–5.81)
RDW: 18.3 % — ABNORMAL HIGH (ref 11.5–15.5)
WBC: 4.1 10*3/uL (ref 4.0–10.5)

## 2014-10-14 LAB — MAGNESIUM: Magnesium: 2.4 mg/dL (ref 1.7–2.4)

## 2014-10-14 LAB — CULTURE, BLOOD (ROUTINE X 2)
CULTURE: NO GROWTH
CULTURE: NO GROWTH

## 2014-10-14 LAB — GLUCOSE, CAPILLARY
GLUCOSE-CAPILLARY: 85 mg/dL (ref 65–99)
GLUCOSE-CAPILLARY: 87 mg/dL (ref 65–99)
GLUCOSE-CAPILLARY: 89 mg/dL (ref 65–99)
GLUCOSE-CAPILLARY: 106 mg/dL — AB (ref 65–99)
Glucose-Capillary: 112 mg/dL — ABNORMAL HIGH (ref 65–99)
Glucose-Capillary: 94 mg/dL (ref 65–99)
Glucose-Capillary: 93 mg/dL (ref 65–99)

## 2014-10-14 LAB — LACTIC ACID, PLASMA: LACTIC ACID, VENOUS: 4.1 mmol/L — AB (ref 0.5–2.0)

## 2014-10-14 MED ORDER — POTASSIUM CHLORIDE 20 MEQ/15ML (10%) PO SOLN
40.0000 meq | Freq: Two times a day (BID) | ORAL | Status: DC
  Administered 2014-10-14 – 2014-10-20 (×12): 40 meq
  Filled 2014-10-14 (×17): qty 30

## 2014-10-14 MED ORDER — FUROSEMIDE 10 MG/ML IJ SOLN
20.0000 mg | Freq: Two times a day (BID) | INTRAMUSCULAR | Status: DC
  Administered 2014-10-14: 20 mg via INTRAVENOUS
  Filled 2014-10-14: qty 2

## 2014-10-14 MED ORDER — SODIUM CHLORIDE 0.9 % IV BOLUS (SEPSIS)
500.0000 mL | Freq: Once | INTRAVENOUS | Status: AC
  Administered 2014-10-14: 500 mL via INTRAVENOUS

## 2014-10-14 MED ORDER — IOHEXOL 300 MG/ML  SOLN
75.0000 mL | Freq: Once | INTRAMUSCULAR | Status: AC | PRN
  Administered 2014-10-14: 75 mL via INTRAVENOUS

## 2014-10-14 MED ORDER — FUROSEMIDE 10 MG/ML IJ SOLN
40.0000 mg | Freq: Two times a day (BID) | INTRAMUSCULAR | Status: DC
  Administered 2014-10-14: 40 mg via INTRAVENOUS
  Filled 2014-10-14 (×3): qty 4

## 2014-10-14 NOTE — Progress Notes (Signed)
CRITICAL VALUE ALERT  Critical value received:  Lactic Acid 4.1  Date of notification:  10/14/2014   Time of notification:  0045  Critical value read back:  yes  Nurse who received alert:  Cheryll Cockayne   MD notified (1st page):  Fredirick Maudlin, NP  Time of first page:  0050  MD notified (2nd page):  Time of second page:  Responding MD:  Fredirick Maudlin, NP  Time MD responded:  [phone]  New orders received for 500 cc NS bolus.  Will carry out orders and continue to monitor.

## 2014-10-14 NOTE — Progress Notes (Signed)
Sputum trap collected given to pt RN to send to Lab. Pt was NTS o2 saturation was stable throughout. Pt had some anxiety due to nasally suctioning. Pt is stable at this time. RN aware of procedure and pt status.

## 2014-10-14 NOTE — Progress Notes (Signed)
TRH Progress Note  Jeffery [name] ION:[id number] DOB: 1990/12/15 DOA: 10/09/2014 PCP: Marijean Bravo, MD  Admit HPI / Brief Narrative: 24 y.o. WM PMHx cerebral palsy, spastic quadriplegia, wheelchair mobile, seizure disorder, has Port-A-Cath, PEG tube, hx of pancytopenia attributed to [place] being managed by oncology with transfusions as needed as well as Neulasta, obstructive sleep apnea on BiPAP daily at bedtime, history of recurrent aspiration pneumonia, status post PEG/J-tube placement presented to the ED with worsening shortness of breath over the past week requiring 02, respiratory distress. Approximate 1 week prior to admission patient was diagnosed with a right middle and right lower lobe pneumonia treated in the outpatient setting by Dr. Marin Olp (oncology) with IM Rocephin 2 and subsequently on oral cefdinir with no significant improvement. Patient presented with increased work of breathing, productive cough of thickened secretions, fever with temperature as high as 105 rectally. Patient was noted to be septic with a fever, tachycardic with heart rate in the 140s, and increased respiratory rate as high as 45. Chest x-ray done actually did show some improvement from prior chest x-ray.  HPI/Subjective: Patient somewhat drowsy per father at bedside, Temp fluctuating from 96.7-992 Just completed chest PT  Assessment/Plan: Acute respiratory distress with Hypoxia -Secondary Aspiration pneumonia.  -Trach aspirate with mod Enterobacter, was on Vanc and Zosyn since admission, changed to [place] 9/9  -Continue Chest PT with vibrator -Continue Brovana and Pulmicort nebulizer BID, guaifenesin 600 mg BID -NTS PRN -i suspect he is also volume overloaded based on CXR and clinical exam/diffuse edema, will also give IV lasix 39m x2 doses, watch I/Os -with hypothermia, worsening CXR findings and increased lactic acid will check CT chest  HCAP +/- aspiration pneumonia with Sepsis -9/9 CXR;  worsening RLL atelectasis/pneumonia  -Tracheal aspirate positive for ENTEROBACTER AEROGENES), resistant to Zosyn,  -Continue Levofloxacin -Ct chest as noted above  Dysphagia status post PEG/ J tube placement -Continue tube feeds , free water  Seizures/generalized convulsive epilepsy -9/8 Tonic/clonic seizures 2 witnessed today. Most likely secondary to patient's continued elevated temperature  -Seizure protocol -Continue Diazepam PRN/ rectal kit (NOTE Versed CAN NOT be used in conjunction with this medication)  -Continue CloBazam (Onfi) 6.25 mg TID.  Cerebral Palsy/spastic quadriplegic -Continue baclofen pump. -Patient receiving Botox injections every 3 months per neurology area outpatient follow-up.  Pancytopenia -Being seen by his oncologist Dr. EMarin Olp -S/P 2 units PRBC last week as outpatient  -Continue Neupogen 480 g daily . -9/9 received 1 unit PRBC, Hb trending down will need another unit in th next 1-2days  Hypokalemia -replace and extra due to lasix  DVT proph: SCds due to low plts  Code Status: DO NOT RESUSCITATE Family Communication: Father present at time of exam Disposition Plan: Resolution sepsis  Consultants: Dr. EMarin Olp(oncology)   Procedure/Significant Events: S/P 2 units PRBC last week as outpatient  -9/9 will transfuse 1 unit PRBC   Culture 9/7 blood Porta cath/left hand NGTD 9/7 urine positive multiple species  9/7 trach aspirate positive GNR (ENTEROBACTER AEROGENES), resistant to Zosyn 9/7 MRSA by PCR negative 9/7 strep pneumo urine antigen/Legionella urine antigen negative 9/9 urine negative   Antibiotics: Zosyn 9/7>> stopped 9/9 Vancomycin 9/7 >>stopped 9/10 Levofloxacin 9/9>>   DVT prophylaxis: SCD   Devices    LINES / TUBES:      Continuous Infusions: . baclofen      Objective: VITAL SIGNS: Temp: 98.5 F (36.9 C) (09/12 1429) Temp Source: Axillary (09/12 1429) BP: 103/71 mmHg (09/12 0800) Pulse Rate: 104  (09/12 0800)  SPO2; FIO2:   Intake/Output Summary (Last 24 hours) at 10/14/14 1552 Last data filed at 10/14/14 0600  Gross per 24 hour  Intake   2265 ml  Output    250 ml  Net   2015 ml     Exam: General: alert sitting in chair appears comfortable, spontaneously opens eyes. does not follow commands negative acute respiratory distress Eyes: Pupils equal round reactive to light and accommodation, negative scleral hemorrhage ENT: Negative Runny nose, negative gingival bleeding, Neck:  Negative scars, masses, torticollis, lymphadenopathy, JVD Lungs: diffuse rhonchi Rt>>Lt, without wheezes or crackles Cardiovascular: Regular rhythm and rate without murmur gallop or rub normal S1 and S2 Abdomen:negative abdominal pain, nondistended, positive soft, bowel sounds, no rebound, no ascites, no appreciable mass, PEG tube in place negative sign of infection Extremities: bilateral lower extremity deformed extremities secondary to cerebral palsy; bilateral distal and hips secondary to cerebral palsy Psychiatric:  Unable to assess  Neurologic:  Withdraws to pain unable to complete assessment    Data Reviewed: Basic Metabolic Panel:  Recent Labs Lab 10/10/14 1029 10/10/14 1606 10/11/14 0913 10/12/14 0340 10/13/14 0345 10/14/14 0500  NA  --  144 140 140 141 141  K  --  3.6 3.1* 3.2* 3.7 3.4*  CL  --  111 107 108 110 110  CO2  --  30 26 24 25 23   GLUCOSE  --  99 97 96 79 86  BUN  --  27* 19 13 10 12   CREATININE  --  0.78 0.82 0.78 0.82 0.81  CALCIUM  --  7.9* 8.0* 8.1* 8.3* 8.1*  MG 2.2 2.3  --  1.7 1.8 2.4  PHOS  --   --   --  3.9  --   --    Liver Function Tests:  Recent Labs Lab 10/10/14 0350 10/11/14 0913 10/12/14 0340 10/13/14 0345 10/14/14 0500  AST 68* 32 32 32 22  ALT 114* 66* 58 55 33  ALKPHOS 106 84 98 114 89  BILITOT 0.8 0.8 0.7 0.6 0.8  PROT 4.8* 4.5* 4.3* 4.1* 4.5*  ALBUMIN 2.0* 1.9* 1.7* 1.6* 2.6*   No results for input(s): LIPASE, AMYLASE in the last 168  hours. No results for input(s): AMMONIA in the last 168 hours. CBC:  Recent Labs Lab 10/10/14 0748 10/11/14 0350 10/11/14 1815 10/12/14 0340 10/13/14 0345 10/14/14 0500  WBC 1.3* 3.6*  --  9.0 7.7 4.1  NEUTROABS 0.8* 2.6  --  7.6 6.2 3.1  HGB 8.6* 7.6* 10.0* 9.3* 9.0* 7.8*  HCT 28.2* 24.5* 32.1* 28.8* 28.4* 24.8*  MCV 106.8* 107.5*  --  102.9* 103.6* 102.9*  PLT 130* 109*  --  102* 105* 94*   Cardiac Enzymes: No results for input(s): CKTOTAL, CKMB, CKMBINDEX, TROPONINI in the last 168 hours. BNP (last 3 results) No results for input(s): BNP in the last 8760 hours.  ProBNP (last 3 results) No results for input(s): PROBNP in the last 8760 hours.  CBG:  Recent Labs Lab 10/13/14 1934 10/13/14 2314 10/14/14 0307 10/14/14 0741 10/14/14 1156  GLUCAP 95 112* 85 87 89    Recent Results (from the past 240 hour(s))  Blood Culture (routine x 2)     Status: None   Collection Time: 10/09/14  7:37 AM  Result Value Ref Range Status   Specimen Description BLOOD PORTA CATH  Final   Special Requests BOTTLES DRAWN AEROBIC AND ANAEROBIC 5CC  Final   Culture NO GROWTH 5 DAYS  Final   Report Status 10/14/2014  FINAL  Final  Urine culture     Status: None   Collection Time: 10/09/14  7:41 AM  Result Value Ref Range Status   Specimen Description URINE, CATHETERIZED  Final   Special Requests NONE  Final   Culture MULTIPLE SPECIES PRESENT, SUGGEST RECOLLECTION  Final   Report Status 10/10/2014 FINAL  Final  Blood Culture (routine x 2)     Status: None   Collection Time: 10/09/14  7:57 AM  Result Value Ref Range Status   Specimen Description BLOOD HAND LEFT  Final   Special Requests BOTTLES DRAWN AEROBIC ONLY 5CC  Final   Culture NO GROWTH 5 DAYS  Final   Report Status 10/14/2014 FINAL  Final  Culture, respiratory (NON-Expectorated)     Status: None   Collection Time: 10/09/14  2:00 PM  Result Value Ref Range Status   Specimen Description TRACHEAL ASPIRATE  Final   Special  Requests Normal  Final   Gram Stain   Final    RARE WBC PRESENT, PREDOMINANTLY MONONUCLEAR RARE SQUAMOUS EPITHELIAL CELLS PRESENT NO ORGANISMS SEEN Performed at [facility]    Culture   Final    MODERATE ENTEROBACTER AEROGENES Performed at [facility]    Report Status 10/11/2014 FINAL  Final   Organism ID, Bacteria ENTEROBACTER AEROGENES  Final      Susceptibility   Enterobacter aerogenes - MIC*    CEFAZOLIN >=64 RESISTANT Resistant     CEFEPIME <=1 SENSITIVE Sensitive     CEFTAZIDIME >=64 RESISTANT Resistant     CEFTRIAXONE >=64 RESISTANT Resistant     CIPROFLOXACIN <=0.25 SENSITIVE Sensitive     GENTAMICIN <=1 SENSITIVE Sensitive     IMIPENEM <=0.25 SENSITIVE Sensitive     PIP/TAZO >=128 RESISTANT Resistant     TOBRAMYCIN <=1 SENSITIVE Sensitive     TRIMETH/SULFA <=20 SENSITIVE Sensitive     * MODERATE ENTEROBACTER AEROGENES  MRSA PCR Screening     Status: None   Collection Time: 10/09/14  4:04 PM  Result Value Ref Range Status   MRSA by PCR NEGATIVE NEGATIVE Final    Comment:        The GeneXpert MRSA Assay (FDA approved for NASAL specimens only), is one component of a comprehensive MRSA colonization surveillance program. It is not intended to diagnose MRSA infection nor to guide or monitor treatment for MRSA infections.   Culture, Urine     Status: None   Collection Time: 10/11/14 11:40 AM  Result Value Ref Range Status   Specimen Description URINE, RANDOM  Final   Special Requests NONE  Final   Culture NO GROWTH 1 DAY  Final   Report Status 10/12/2014 FINAL  Final  Culture, blood (routine x 2)     Status: None (Preliminary result)   Collection Time: 10/13/14  6:50 PM  Result Value Ref Range Status   Specimen Description BLOOD LEFT HAND  Final   Special Requests IN PEDIATRIC BOTTLE 1CC  Final   Culture NO GROWTH < 24 HOURS  Final   Report Status PENDING  Incomplete  Culture, blood (routine x 2)     Status: None (Preliminary result)    Collection Time: 10/13/14  7:15 PM  Result Value Ref Range Status   Specimen Description BLOOD LEFT HAND  Final   Special Requests IN PEDIATRIC BOTTLE 2CC  Final   Culture NO GROWTH < 24 HOURS  Final   Report Status PENDING  Incomplete  Culture, respiratory (NON-Expectorated)     Status: None (Preliminary  result)   Collection Time: 10/14/14  1:00 AM  Result Value Ref Range Status   Specimen Description TRACHEAL ASPIRATE  Final   Special Requests NONE  Final   Gram Stain   Final    NO WBC SEEN MODERATE SQUAMOUS EPITHELIAL CELLS PRESENT NO ORGANISMS SEEN Performed at [facility]    Culture PENDING  Incomplete   Report Status PENDING  Incomplete     Studies:  Recent x-ray studies have been reviewed in detail by the Attending Physician  Scheduled Meds:  Scheduled Meds: . arformoterol  15 mcg Nebulization BID  . budesonide (PULMICORT) nebulizer solution  0.5 mg Nebulization BID  . cloBAZam  6.25 mg Per Tube 3 times per day  . diphenhydrAMINE  25 mg Intravenous Daily  . feeding supplement (PRO-STAT SUGAR FREE 64)  30 mL Per Tube BID  . filgrastim  480 mcg Subcutaneous Daily  . folic acid  1 mg Intravenous Daily  . free water  180 mL Per Tube Q4H  . furosemide  20 mg Intravenous Q12H  . gabapentin  900 mg Per Tube 3 times per day  . guaiFENesin  30 mL Per Tube BID AC  . insulin aspart  0-9 Units Subcutaneous 6 times per day  . levalbuterol  0.63 mg Nebulization Q6H  . levofloxacin (LEVAQUIN) IV  750 mg Intravenous Q24H  . metoCLOPramide  10 mg Per Tube TID AC & HS  . multivitamin  10 mL Per Tube Daily  . omeprazole  20 mg Oral BID  . potassium chloride  40 mEq Per Tube BID  . Pseudoephedrine HCl  30 mg Per J Tube 3 times per day  . sodium chloride  3 mL Intravenous Q12H  . sucralfate  1 g Per Tube TID BM  . thiamine  100 mg Intravenous Daily  . TWOCAL HN  545 mL Per Tube Q24H    Time spent on care of this patient: 40 mins   Domenic Polite , MD  [facility] Office  [phone] Pager - [phone]  On-Call/Text Page:      [email or web address]      password [email or web address]  If 7PM-7AM, please contact night-coverage [email or web address] Password [email or web address] 10/14/2014, 3:52 PM   LOS: 5 days   Care during the described time interval was provided by me .  I have reviewed this patient's available data, including medical history, events of note, physical examination, and all test results as part of my evaluation. I have personally reviewed and interpreted all radiology studies.   Dia Crawford, MD [phone] Pager

## 2014-10-14 NOTE — Progress Notes (Signed)
ANTIBIOTIC CONSULT NOTE - FOLLOW UP  Pharmacy Consult for Levaquin Indication: Enterobacter pneumonia  Allergies  Allergen Reactions  . Depakote [Divalproex Sodium]     Causes pancreatitis   . Vimpat [Lacosamide] Rash  . Keppra [Levetiracetam] Other (See Comments)    Bone marrow suppression  . Adhesive [Tape] Other (See Comments)    Rips skin off (paper tape is ok)  . Amoxicillin-Pot Clavulanate Rash    If given with fluconazole rash is not as severe or does not appear at all  . Neulasta [Pegfilgrastim] Other (See Comments)    Fever, tachycardia    Patient Measurements: Height: 4\' 2"  (127 cm) Weight: 145 lb 8.1 oz (66 kg) IBW/kg (Calculated) : 27  Vital Signs: Temp: 98.4 F (36.9 C) (09/12 1211) Temp Source: Axillary (09/12 1211) BP: 103/71 mmHg (09/12 0800) Pulse Rate: 104 (09/12 0800) Intake/Output from previous day: 09/11 0701 - 09/12 0700 In: 2935 [I.V.:960; NG/GT:945; IV Piggyback:1000] Out: 600 [Urine:600]  Labs:  Recent Labs  10/12/14 0340 10/13/14 0345 10/14/14 0500  WBC 9.0 7.7 4.1  HGB 9.3* 9.0* 7.8*  PLT 102* 105* 94*  CREATININE 0.78 0.82 0.81   Estimated Creatinine Clearance: 84.7 mL/min (by C-G formula based on Cr of 0.81).   Microbiology: Recent Results (from the past 720 hour(s))  Blood Culture (routine x 2)     Status: None   Collection Time: 10/09/14  7:37 AM  Result Value Ref Range Status   Specimen Description BLOOD PORTA CATH  Final   Special Requests BOTTLES DRAWN AEROBIC AND ANAEROBIC 5CC  Final   Culture NO GROWTH 5 DAYS  Final   Report Status 10/14/2014 FINAL  Final  Urine culture     Status: None   Collection Time: 10/09/14  7:41 AM  Result Value Ref Range Status   Specimen Description URINE, CATHETERIZED  Final   Special Requests NONE  Final   Culture MULTIPLE SPECIES PRESENT, SUGGEST RECOLLECTION  Final   Report Status 10/10/2014 FINAL  Final  Blood Culture (routine x 2)     Status: None   Collection Time: 10/09/14  7:57  AM  Result Value Ref Range Status   Specimen Description BLOOD HAND LEFT  Final   Special Requests BOTTLES DRAWN AEROBIC ONLY 5CC  Final   Culture NO GROWTH 5 DAYS  Final   Report Status 10/14/2014 FINAL  Final  Culture, respiratory (NON-Expectorated)     Status: None   Collection Time: 10/09/14  2:00 PM  Result Value Ref Range Status   Specimen Description TRACHEAL ASPIRATE  Final   Special Requests Normal  Final   Gram Stain   Final    RARE WBC PRESENT, PREDOMINANTLY MONONUCLEAR RARE SQUAMOUS EPITHELIAL CELLS PRESENT NO ORGANISMS SEEN Performed at [facility]    Culture   Final    MODERATE ENTEROBACTER AEROGENES Performed at [facility]    Report Status 10/11/2014 FINAL  Final   Organism ID, Bacteria ENTEROBACTER AEROGENES  Final      Susceptibility   Enterobacter aerogenes - MIC*    CEFAZOLIN >=64 RESISTANT Resistant     CEFEPIME <=1 SENSITIVE Sensitive     CEFTAZIDIME >=64 RESISTANT Resistant     CEFTRIAXONE >=64 RESISTANT Resistant     CIPROFLOXACIN <=0.25 SENSITIVE Sensitive     GENTAMICIN <=1 SENSITIVE Sensitive     IMIPENEM <=0.25 SENSITIVE Sensitive     PIP/TAZO >=128 RESISTANT Resistant     TOBRAMYCIN <=1 SENSITIVE Sensitive     TRIMETH/SULFA <=20 SENSITIVE Sensitive     *  MODERATE ENTEROBACTER AEROGENES  MRSA PCR Screening     Status: None   Collection Time: 10/09/14  4:04 PM  Result Value Ref Range Status   MRSA by PCR NEGATIVE NEGATIVE Final    Comment:        The GeneXpert MRSA Assay (FDA approved for NASAL specimens only), is one component of a comprehensive MRSA colonization surveillance program. It is not intended to diagnose MRSA infection nor to guide or monitor treatment for MRSA infections.   Culture, Urine     Status: None   Collection Time: 10/11/14 11:40 AM  Result Value Ref Range Status   Specimen Description URINE, RANDOM  Final   Special Requests NONE  Final   Culture NO GROWTH 1 DAY  Final   Report Status  10/12/2014 FINAL  Final  Culture, blood (routine x 2)     Status: None (Preliminary result)   Collection Time: 10/13/14  6:50 PM  Result Value Ref Range Status   Specimen Description BLOOD LEFT HAND  Final   Special Requests IN PEDIATRIC BOTTLE 1CC  Final   Culture NO GROWTH < 24 HOURS  Final   Report Status PENDING  Incomplete  Culture, blood (routine x 2)     Status: None (Preliminary result)   Collection Time: 10/13/14  7:15 PM  Result Value Ref Range Status   Specimen Description BLOOD LEFT HAND  Final   Special Requests IN PEDIATRIC BOTTLE 2CC  Final   Culture NO GROWTH < 24 HOURS  Final   Report Status PENDING  Incomplete  Culture, respiratory (NON-Expectorated)     Status: None (Preliminary result)   Collection Time: 10/14/14  1:00 AM  Result Value Ref Range Status   Specimen Description TRACHEAL ASPIRATE  Final   Special Requests NONE  Final   Gram Stain   Final    NO WBC SEEN MODERATE SQUAMOUS EPITHELIAL CELLS PRESENT NO ORGANISMS SEEN Performed at [facility]    Culture PENDING  Incomplete   Report Status PENDING  Incomplete   Assessment:   Day # 6 antibiotics, day # 4 Levaquin for Enterobacter pneumonia.      Vanc 9/7>>9/10      Zosyn 9/7>>9/9      Levaquin 9/9>>    Tmax 99.2, WBC down to 4.1. (day #5 Neupogen 480 mcg daily).  Lactic acid 4.7> 4.1 last night.  Noted worsening CXR.  Repeat blood cultures sent 9/11, tracheal aspirate culture sent today.  Goal of Therapy:  appropriate Levaquin dose for renal function and infection  Plan:   Continue Levaquin 750 mg IV q24hrs.    Follow renal function, culture data, progress.  Arty Baumgartner, [place] Pager: [phone] 10/14/2014,2:30 PM

## 2014-10-14 NOTE — Progress Notes (Signed)
Jeffery [name] did okay over the weekend. His white cell count is trending downward a little bit. His hemoglobin is trending down a little bit. His lactic acid is still fairly high.  Unfortunately, his parents were not with him this morning.  A chest x-ray yesterday showed progression of pulmonary infiltrate. An x-ray today is pending. I think that he is on Levaquin. He might need to have a CT of the chest on but this be very difficult to do given his overall body habitus.  His blood pressure looks okay.   It is very troublesome that the x-ray appear to be getting worse.  I think that I probably would hold off on transfusing him right now. We will have to see how his hemoglobin looks tomorrow.  I do appreciate all the great care that he is getting from the staff in the stepdown unit!!  [name]

## 2014-10-14 NOTE — Progress Notes (Signed)
CPT vest not done, pt is on tube feeding at this time. Pt is stable no complications noted.

## 2014-10-14 NOTE — Progress Notes (Signed)
Pt place on bipap at this time. Settings in flowsheet. Pt is stable at this time.

## 2014-10-14 NOTE — Progress Notes (Signed)
Dad performed chest vest with pt about 10

## 2014-10-14 NOTE — Progress Notes (Signed)
Father ask to hold CPT through night. RT will continue to monitor pt closely

## 2014-10-15 ENCOUNTER — Inpatient Hospital Stay (HOSPITAL_COMMUNITY): Payer: Medicaid Other

## 2014-10-15 ENCOUNTER — Encounter (HOSPITAL_COMMUNITY): Payer: Self-pay | Admitting: Radiology

## 2014-10-15 DIAGNOSIS — B3749 Other urogenital candidiasis: Secondary | ICD-10-CM | POA: Diagnosis present

## 2014-10-15 DIAGNOSIS — D61818 Other pancytopenia: Secondary | ICD-10-CM | POA: Diagnosis present

## 2014-10-15 DIAGNOSIS — T68XXXA Hypothermia, initial encounter: Secondary | ICD-10-CM

## 2014-10-15 DIAGNOSIS — R509 Fever, unspecified: Secondary | ICD-10-CM | POA: Diagnosis present

## 2014-10-15 LAB — COMPREHENSIVE METABOLIC PANEL
ALT: 30 U/L (ref 17–63)
AST: 25 U/L (ref 15–41)
Albumin: 2.7 g/dL — ABNORMAL LOW (ref 3.5–5.0)
Alkaline Phosphatase: 125 U/L (ref 38–126)
Anion gap: 8 (ref 5–15)
BILIRUBIN TOTAL: 1 mg/dL (ref 0.3–1.2)
BUN: 17 mg/dL (ref 6–20)
CO2: 26 mmol/L (ref 22–32)
CREATININE: 0.96 mg/dL (ref 0.61–1.24)
Calcium: 9.1 mg/dL (ref 8.9–10.3)
Chloride: 111 mmol/L (ref 101–111)
Glucose, Bld: 91 mg/dL (ref 65–99)
POTASSIUM: 4.7 mmol/L (ref 3.5–5.1)
Sodium: 145 mmol/L (ref 135–145)
TOTAL PROTEIN: 5 g/dL — AB (ref 6.5–8.1)

## 2014-10-15 LAB — LACTIC ACID, PLASMA: Lactic Acid, Venous: 5.3 mmol/L (ref 0.5–2.0)

## 2014-10-15 LAB — CBC WITH DIFFERENTIAL/PLATELET
BASOS PCT: 1 % (ref 0–1)
Basophils Absolute: 0 10*3/uL (ref 0.0–0.1)
EOS ABS: 0.1 10*3/uL (ref 0.0–0.7)
EOS PCT: 2 % (ref 0–5)
HEMATOCRIT: 27 % — AB (ref 39.0–52.0)
Hemoglobin: 8.4 g/dL — ABNORMAL LOW (ref 13.0–17.0)
LYMPHS ABS: 0.4 10*3/uL — AB (ref 0.7–4.0)
Lymphocytes Relative: 9 % — ABNORMAL LOW (ref 12–46)
MCH: 32.9 pg (ref 26.0–34.0)
MCHC: 31.1 g/dL (ref 30.0–36.0)
MCV: 105.9 fL — AB (ref 78.0–100.0)
MONO ABS: 1.1 10*3/uL — AB (ref 0.1–1.0)
Monocytes Relative: 24 % — ABNORMAL HIGH (ref 3–12)
NEUTROS ABS: 2.8 10*3/uL (ref 1.7–7.7)
Neutrophils Relative %: 64 % (ref 43–77)
Platelets: 115 10*3/uL — ABNORMAL LOW (ref 150–400)
RBC: 2.55 MIL/uL — ABNORMAL LOW (ref 4.22–5.81)
RDW: 19.4 % — AB (ref 11.5–15.5)
WBC: 4.4 10*3/uL (ref 4.0–10.5)

## 2014-10-15 LAB — GLUCOSE, CAPILLARY
GLUCOSE-CAPILLARY: 83 mg/dL (ref 65–99)
Glucose-Capillary: 91 mg/dL (ref 65–99)
Glucose-Capillary: 85 mg/dL (ref 65–99)
Glucose-Capillary: 84 mg/dL (ref 65–99)
Glucose-Capillary: 89 mg/dL (ref 65–99)
Glucose-Capillary: 84 mg/dL (ref 65–99)

## 2014-10-15 LAB — LIPASE, BLOOD: Lipase: 20 U/L — ABNORMAL LOW (ref 22–51)

## 2014-10-15 LAB — URINE CULTURE: Culture: >=100000

## 2014-10-15 LAB — MAGNESIUM: MAGNESIUM: 2.1 mg/dL (ref 1.7–2.4)

## 2014-10-15 LAB — AMYLASE: Amylase: 42 U/L (ref 28–100)

## 2014-10-15 MED ORDER — FLUCONAZOLE IN SODIUM CHLORIDE 100-0.9 MG/50ML-% IV SOLN: 100.0000 mg | INTRAVENOUS | Status: DC

## 2014-10-15 MED ORDER — ALBUMIN HUMAN 25 % IV SOLN
50.0000 g | Freq: Once | INTRAVENOUS | Status: AC
  Administered 2014-10-15: 37.5 g via INTRAVENOUS
  Filled 2014-10-15: qty 200

## 2014-10-15 MED ORDER — LIDOCAINE HCL (PF) 1 % IJ SOLN
INTRAMUSCULAR | Status: AC
  Filled 2014-10-15: qty 10

## 2014-10-15 MED ORDER — IOHEXOL 300 MG/ML  SOLN
80.0000 mL | Freq: Once | INTRAMUSCULAR | Status: AC | PRN
  Administered 2014-10-15: 80 mL via INTRAVENOUS

## 2014-10-15 MED ORDER — FLUCONAZOLE IN SODIUM CHLORIDE 200-0.9 MG/100ML-% IV SOLN
200.0000 mg | INTRAVENOUS | Status: DC
  Administered 2014-10-16 – 2014-10-19 (×4): 200 mg via INTRAVENOUS
  Filled 2014-10-15 (×5): qty 100

## 2014-10-15 MED ORDER — IOHEXOL 300 MG/ML  SOLN
25.0000 mL | INTRAMUSCULAR | Status: AC
  Administered 2014-10-15: 25 mL via ORAL
  Administered 2014-10-15: 12.5 mL via ORAL

## 2014-10-15 MED ORDER — FLUCONAZOLE IN SODIUM CHLORIDE 400-0.9 MG/200ML-% IV SOLN
400.0000 mg | Freq: Once | INTRAVENOUS | Status: AC
  Administered 2014-10-15: 400 mg via INTRAVENOUS
  Filled 2014-10-15 (×2): qty 200

## 2014-10-15 NOTE — Progress Notes (Signed)
Pt is stable at this time 97% o2 sat. Father at bedside

## 2014-10-15 NOTE — Progress Notes (Signed)
Jeffery [name] seems to be doing better this morning. He looks more relaxed. His heart rate is lower. He has better oxygen saturation of 98%.  He had a chest CT scan done yesterday. This showed large bilateral pleural effusions. This might be secondary to volume overload. It could be secondary to pneumonia. I agree with doing a thoracentesis. I'll make sure that the fluid is also sent for cytology.  His father is in the room this morning. I got an update from him.  His heart itself if he still getting Neupogen. I would like to think that his Neupogen is on hold as his Darrol Angel count was up to 7.7. Today, it is 4.4. Hemoglobin 8.4. Platelet count 115,000.  He's had no issues with nausea or vomiting. He's had no obvious rashes. He's had no diarrhea.  He is getting his tube feeds.  On his physical exam, all his vital signs look okay. Has a low-grade temperature of 99.7. Pulse 102. Respiratory rate 25. Blood pressure 130/64. His lungs sound pretty good bilaterally. There may be some slight decrease at the bases. Cardiac exam is slightly tachycardic but regular. No murmurs. Abdomen is soft. Extremities shows some trace edema.  I think he is going for thoracentesis today. I don't know if this is unilateral or bilateral.  He is on the antibiotics. Cultures so far have been negative.  He had an echocardiogram done back in May. Everything looked good with his cardiac function.  I appreciate all the great care that he is getting right now I do not see need for any transfusions right now.  [name] 12:2

## 2014-10-15 NOTE — Progress Notes (Signed)
Father has ask to hold CPT throughout night and let his son rest. RT will closely monitor. Pt is stable at this time.

## 2014-10-15 NOTE — Progress Notes (Signed)
Pt just left for thoracentesis procedure.

## 2014-10-15 NOTE — Progress Notes (Signed)
Father of pt refused Chest Vest at this time in care of high fever of 103.0. RT will continue to monitor.

## 2014-10-15 NOTE — Progress Notes (Signed)
Change o2 device for increase WOB

## 2014-10-15 NOTE — Progress Notes (Signed)
Chest vest delayed c/o pending thoracentesis per father.

## 2014-10-15 NOTE — Progress Notes (Signed)
Patient presented to [name] for thoracentesis, upon reviewing Jeff[name] and CT images of bilateral effusions with Dr. Pascal Lux procedure was cancelled secondary to effusions being too small to safely proceed percutaneously.   Tsosie Billing PA-C Interventional Radiology  10/15/14  2:12 PM

## 2014-10-15 NOTE — Progress Notes (Signed)
[place] TEAM 1 - Stepdown/ICU TEAM Progress Note  Jeffery [name] HKV:[id number] DOB: Nov 23, 1990 DOA: 10/09/2014 PCP: Marijean Bravo, MD  Admit HPI / Brief Narrative: 24 y.o. WM PMHx cerebral palsy, spastic quadriplegia, wheelchair mobile, seizure disorder, has Port-A-Cath, PEG tube, hx of pancytopenia attributed to Irvona being managed by oncology with transfusions as needed as well as Neulasta, obstructive sleep apnea on BiPAP daily at bedtime, history of recurrent aspiration pneumonia, status post PEG/J-tube placement presented to the ED with worsening shortness of breath over the past week requiring 02, respiratory distress. Approximate 1 week prior to admission patient was diagnosed with a right middle and right lower lobe pneumonia treated in the outpatient setting by Dr. Marin Olp (oncology) with IM Rocephin 2 and subsequently on oral cefdinir with no significant improvement. Patient presented with increased work of breathing, productive cough of thickened secretions, fever with temperature as high as 105 rectally. Patient was noted to be septic with a fever, tachycardic with heart rate in the 140s, and increased respiratory rate as high as 45. Chest x-ray done actually did show some improvement from prior chest x-ray.  HPI/Subjective: 9/13 patient spiking fever again,looking uncomfortable, increased lethargic.Spontaneously opens eyes  Assessment/Plan: Acute respiratory distress with Hypoxia -Likely secondary to partially treated HCAP vs Aspiration pneumonia.  -Lactic acid continues to climb despite appropriate antibiotics and fluids.   -Trach aspirate positive Enterobacter resistant to Zosyn  -O2/BiPAP as needed -Continue Physiotherapy with vibrator; avoid his Port-A-Cath right chest wall -Continue Brovana and Pulmicort nebulizer BID -Continue guaifenesin 600 mg BID -Continue Levaquin -NTS BID -Strict in and out + 11.3 L  HCAP +/- aspiration pneumonia -9/9 CXR; worsening RLL  atelectasis/pneumonia  -Tracheal aspirate positive GNR;(ENTEROBACTER AEROGENES), resistant to Zosyn,  -Continue Levofloxacin  Severe Sepsis likely secondary to probable pneumonia/funguria (candidal UTI) -Repeat CXR; worsening atelectasis/pneumonia RLL -Increasing lactic acidosis.  -9/11 Panculture, obtain PCXR -Start, fluconazole IV 400 mg 1; then 100 mg daily  Lactic acidosis -9/11 NOTE; this a.m. lactic acid not resulted, had to contact lab 2 times in order to obtain requested a.m. lactic acid. - See acute respiratory distress --Hold on placing PICC line and pulling port as possible infection is funguria  Hypothermia/hyperthermia -Bear hugger/cooling blanket  maintain body temp 37C  dysphagia status post PEG/ J tube placement -When not on BiPAP resume tube feeds   seizures/generalized convulsive epilepsy -9/8 Tonic/clonic seizures 2 witnessed today. Most likely secondary to patient's continued elevated temperature  -Seizure protocol -Continue Diastat rectal kit (NOTE Versed CAN NOT be used in conjunction with this medication)  -Continue CloBazam (Onfi) 6.25 mg TID.  Cerebral Palsy/spastic quadriplegic -Continue baclofen pump. -Patient receiving Botox injections every 3 months per neurology area outpatient follow-up.  Pancytopenia -Being seen by his oncologist Dr. Marin Olp. -S/P 2 units PRBC last week as outpatient  -Continue Neupogen 480 g daily . -9/9 will transfuse 1 unit PRBC  Hypokalemia -WNL   Code Status: DO NOT RESUSCITATE Family Communication: Father present at time of exam Disposition Plan: Resolution sepsis    Consultants: Dr. Marin Olp (oncology)   Procedure/Significant Events: S/P 2 units PRBC last week as outpatient  -9/9 will transfuse 1 unit PRBC   Culture 9/7 blood Porta cath/left hand NGTD 9/7 urine positive multiple species  9/7 trach aspirate positive GNR (ENTEROBACTER AEROGENES), resistant to Zosyn 9/7 MRSA by PCR negative 9/7 strep  pneumo urine antigen/Legionella urine antigen negative 9/9 urine negative 9/11 blood left hand 2 NGTD 9/12 urine positive yeast> 100,000 colonies 9/13 sputum pending  Antibiotics: Zosyn 9/7>> stopped 9/9 Vancomycin 9/7 >>stopped 9/10 Levofloxacin 9/9>>  Diflucan 9/13>>   DVT prophylaxis: SCD   Devices    LINES / TUBES:      Continuous Infusions: . baclofen      Objective: VITAL SIGNS: Temp: 102.4 F (39.1 C) (09/13 1745) Temp Source: Rectal (09/13 1745) BP: 131/75 mmHg (09/13 1700) Pulse Rate: 39 (09/13 1700) SPO2; FIO2:   Intake/Output Summary (Last 24 hours) at 10/15/14 2007 Last data filed at 10/15/14 1700  Gross per 24 hour  Intake    900 ml  Output      0 ml  Net    900 ml     Exam: General: Lethargic but arousable, appears somewhat uncomfortable in bed, opens eyes to stimulation, does not follow commands negative acute respiratory distress, positive chronic respiratory distress Eyes: Pupils equal round reactive to light and accommodation, negative scleral hemorrhage ENT: Negative Runny nose, negative gingival bleeding, Neck:  Negative scars, masses, torticollis, lymphadenopathy, JVD Lungs: diffuse rhonchi Rt>>Lt, without wheezes or crackles, somewhat improved Cardiovascular: Tachycardic Regular rate without murmur gallop or rub normal S1 and S2 Abdomen:negative abdominal pain, nondistended, positive soft, bowel sounds, no rebound, no ascites, no appreciable mass, PEG tube in place negative sign of infection Extremities: bilateral lower extremity deformed extremities secondary to cerebral palsy; bilateral distal and hips secondary to cerebral palsy Psychiatric:  Unable to assess  Neurologic:  Withdraws to pain unable to complete assessment    Data Reviewed: Basic Metabolic Panel:  Recent Labs Lab 10/10/14 1606 10/11/14 0913 10/12/14 0340 10/13/14 0345 10/14/14 0500 10/15/14 0424  NA 144 140 140 141 141 145  K 3.6 3.1* 3.2* 3.7 3.4*  4.7  CL 111 107 108 110 110 111  CO2 _0 GLUCOSE 99 97 96 79 86 91  BUN 27* _1 CREATININE 0.78 0.82 0.78 0.82 0.81 0.96  CALCIUM 7.9* 8.0* 8.1* 8.3* 8.1* 9.1  MG 2.3  --  1.7 1.8 2.4 2.1  PHOS  --   --  3.9  --   --   --    Liver Function Tests:  Recent Labs Lab 10/11/14 0913 10/12/14 0340 10/13/14 0345 10/14/14 0500 10/15/14 0424  AST 32 32 32 22 25  ALT 66* 58 55 33 30  ALKPHOS 84 98 114 89 125  BILITOT 0.8 0.7 0.6 0.8 1.0  PROT 4.5* 4.3* 4.1* 4.5* 5.0*  ALBUMIN 1.9* 1.7* 1.6* 2.6* 2.7*    Recent Labs Lab 10/15/14 1625  LIPASE 20*  AMYLASE 42   No results for input(s): AMMONIA in the last 168 hours. CBC:  Recent Labs Lab 10/11/14 0350 10/11/14 1815 10/12/14 0340 10/13/14 0345 10/14/14 0500 10/15/14 0424  WBC 3.6*  --  9.0 7.7 4.1 4.4  NEUTROABS 2.6  --  7.6 6.2 3.1 2.8  HGB 7.6* 10.0* 9.3* 9.0* 7.8* 8.4*  HCT 24.5* 32.1* 28.8* 28.4* 24.8* 27.0*  MCV 107.5*  --  102.9* 103.6* 102.9* 105.9*  PLT 109*  --  102* 105* 94* 115*   Cardiac Enzymes: No results for input(s): CKTOTAL, CKMB, CKMBINDEX, TROPONINI in the last 168 hours. BNP (last 3 results) No results for input(s): BNP in the last 8760 hours.  ProBNP (last 3 results) No results for input(s): PROBNP in the last 8760 hours.  CBG:  Recent Labs Lab 10/14/14 2311 10/15/14 0434 10/15/14 0734 10/15/14 1145 10/15/14 1432  GLUCAP 106* 91 83 85 84    Recent Results (  from the past 240 hour(s))  Blood Culture (routine x 2)     Status: None   Collection Time: 10/09/14  7:37 AM  Result Value Ref Range Status   Specimen Description BLOOD PORTA CATH  Final   Special Requests BOTTLES DRAWN AEROBIC AND ANAEROBIC 5CC  Final   Culture NO GROWTH 5 DAYS  Final   Report Status 10/14/2014 FINAL  Final  Urine culture     Status: None   Collection Time: 10/09/14  7:41 AM  Result Value Ref Range Status   Specimen Description URINE, CATHETERIZED  Final   Special Requests NONE   Final   Culture MULTIPLE SPECIES PRESENT, SUGGEST RECOLLECTION  Final   Report Status 10/10/2014 FINAL  Final  Blood Culture (routine x 2)     Status: None   Collection Time: 10/09/14  7:57 AM  Result Value Ref Range Status   Specimen Description BLOOD HAND LEFT  Final   Special Requests BOTTLES DRAWN AEROBIC ONLY 5CC  Final   Culture NO GROWTH 5 DAYS  Final   Report Status 10/14/2014 FINAL  Final  Culture, respiratory (NON-Expectorated)     Status: None   Collection Time: 10/09/14  2:00 PM  Result Value Ref Range Status   Specimen Description TRACHEAL ASPIRATE  Final   Special Requests Normal  Final   Gram Stain   Final    RARE WBC PRESENT, PREDOMINANTLY MONONUCLEAR RARE SQUAMOUS EPITHELIAL CELLS PRESENT NO ORGANISMS SEEN Performed at [facility]    Culture   Final    MODERATE ENTEROBACTER AEROGENES Performed at [facility]    Report Status 10/11/2014 FINAL  Final   Organism ID, Bacteria ENTEROBACTER AEROGENES  Final      Susceptibility   Enterobacter aerogenes - MIC*    CEFAZOLIN >=64 RESISTANT Resistant     CEFEPIME <=1 SENSITIVE Sensitive     CEFTAZIDIME >=64 RESISTANT Resistant     CEFTRIAXONE >=64 RESISTANT Resistant     CIPROFLOXACIN <=0.25 SENSITIVE Sensitive     GENTAMICIN <=1 SENSITIVE Sensitive     IMIPENEM <=0.25 SENSITIVE Sensitive     PIP/TAZO >=128 RESISTANT Resistant     TOBRAMYCIN <=1 SENSITIVE Sensitive     TRIMETH/SULFA <=20 SENSITIVE Sensitive     * MODERATE ENTEROBACTER AEROGENES  MRSA PCR Screening     Status: None   Collection Time: 10/09/14  4:04 PM  Result Value Ref Range Status   MRSA by PCR NEGATIVE NEGATIVE Final    Comment:        The GeneXpert MRSA Assay (FDA approved for NASAL specimens only), is one component of a comprehensive MRSA colonization surveillance program. It is not intended to diagnose MRSA infection nor to guide or monitor treatment for MRSA infections.   Culture, Urine     Status: None   Collection  Time: 10/11/14 11:40 AM  Result Value Ref Range Status   Specimen Description URINE, RANDOM  Final   Special Requests NONE  Final   Culture NO GROWTH 1 DAY  Final   Report Status 10/12/2014 FINAL  Final  Culture, blood (routine x 2)     Status: None (Preliminary result)   Collection Time: 10/13/14  6:50 PM  Result Value Ref Range Status   Specimen Description BLOOD LEFT HAND  Final   Special Requests IN PEDIATRIC BOTTLE 1CC  Final   Culture NO GROWTH 2 DAYS  Final   Report Status PENDING  Incomplete  Culture, blood (routine x 2)  Status: None (Preliminary result)   Collection Time: 10/13/14  7:15 PM  Result Value Ref Range Status   Specimen Description BLOOD LEFT HAND  Final   Special Requests IN PEDIATRIC BOTTLE 2CC  Final   Culture NO GROWTH 2 DAYS  Final   Report Status PENDING  Incomplete  Culture, Urine     Status: None   Collection Time: 10/14/14 12:20 AM  Result Value Ref Range Status   Specimen Description URINE, CATHETERIZED  Final   Special Requests NONE  Final   Culture >=100,000 COLONIES/mL YEAST  Final   Report Status 10/15/2014 FINAL  Final  Culture, respiratory (NON-Expectorated)     Status: None (Preliminary result)   Collection Time: 10/14/14  1:00 AM  Result Value Ref Range Status   Specimen Description TRACHEAL ASPIRATE  Final   Special Requests NONE  Final   Gram Stain   Final    NO WBC SEEN MODERATE SQUAMOUS EPITHELIAL CELLS PRESENT NO ORGANISMS SEEN Performed at [facility]    Culture   Final    Non-Pathogenic Oropharyngeal-type Flora Isolated. Performed at [facility]    Report Status PENDING  Incomplete     Studies:  Recent x-ray studies have been reviewed in detail by the Attending Physician  Scheduled Meds:  Scheduled Meds: . arformoterol  15 mcg Nebulization BID  . budesonide (PULMICORT) nebulizer solution  0.5 mg Nebulization BID  . cloBAZam  6.25 mg Per Tube 3 times per day  . diphenhydrAMINE  25 mg Intravenous  Daily  . feeding supplement (PRO-STAT SUGAR FREE 64)  30 mL Per Tube BID  . [START ON 10/16/2014] fluconazole (DIFLUCAN) IV  100 mg Intravenous Q24H  . fluconazole (DIFLUCAN) IV  400 mg Intravenous Once  . folic acid  1 mg Intravenous Daily  . free water  180 mL Per Tube Q4H  . gabapentin  900 mg Per Tube 3 times per day  . guaiFENesin  30 mL Per Tube BID AC  . insulin aspart  0-9 Units Subcutaneous 6 times per day  . levalbuterol  0.63 mg Nebulization Q6H  . levofloxacin (LEVAQUIN) IV  750 mg Intravenous Q24H  . lidocaine (PF)      . metoCLOPramide  10 mg Per Tube TID AC & HS  . multivitamin  10 mL Per Tube Daily  . omeprazole  20 mg Oral BID  . potassium chloride  40 mEq Per Tube BID  . Pseudoephedrine HCl  30 mg Per J Tube 3 times per day  . sodium chloride  3 mL Intravenous Q12H  . sucralfate  1 g Per Tube TID BM  . thiamine  100 mg Intravenous Daily  . TWOCAL HN  545 mL Per Tube Q24H    Time spent on care of this patient: 40 mins   Aina Rossbach, Geraldo Docker , MD  [facility] Office  [phone] Pager - [phone]  On-Call/Text Page:      [email or web address]      password [email or web address]  If 7PM-7AM, please contact night-coverage [email or web address] Password [email or web address] 10/15/2014, 8:07 PM   LOS: 6 days   Care during the described time interval was provided by me .  I have reviewed this patient's available data, including medical history, events of note, physical examination, and all test results as part of my evaluation. I have personally reviewed and interpreted all radiology studies.   Dia Crawford, MD [phone] Pager

## 2014-10-15 NOTE — Progress Notes (Signed)
Pt is on BiPAP at this time. O2 saturations are stable, No complication noted. Refer to  flow sheet for settings.

## 2014-10-16 DIAGNOSIS — B3749 Other urogenital candidiasis: Secondary | ICD-10-CM

## 2014-10-16 DIAGNOSIS — J9621 Acute and chronic respiratory failure with hypoxia: Secondary | ICD-10-CM

## 2014-10-16 DIAGNOSIS — D619 Aplastic anemia, unspecified: Secondary | ICD-10-CM

## 2014-10-16 DIAGNOSIS — J80 Acute respiratory distress syndrome: Secondary | ICD-10-CM

## 2014-10-16 DIAGNOSIS — J9601 Acute respiratory failure with hypoxia: Secondary | ICD-10-CM

## 2014-10-16 DIAGNOSIS — G808 Other cerebral palsy: Secondary | ICD-10-CM

## 2014-10-16 LAB — COMPREHENSIVE METABOLIC PANEL
ALBUMIN: 2.8 g/dL — AB (ref 3.5–5.0)
ALK PHOS: 111 U/L (ref 38–126)
ALT: 23 U/L (ref 17–63)
AST: 19 U/L (ref 15–41)
Anion gap: 8 (ref 5–15)
BILIRUBIN TOTAL: 1.1 mg/dL (ref 0.3–1.2)
BUN: 16 mg/dL (ref 6–20)
CALCIUM: 8.9 mg/dL (ref 8.9–10.3)
CO2: 28 mmol/L (ref 22–32)
CREATININE: 0.86 mg/dL (ref 0.61–1.24)
Chloride: 106 mmol/L (ref 101–111)
GFR calc Af Amer: >60 mL/min (ref >60)
GLUCOSE: 89 mg/dL (ref 65–99)
POTASSIUM: 3.8 mmol/L (ref 3.5–5.1)
Sodium: 142 mmol/L (ref 135–145)
TOTAL PROTEIN: 5 g/dL — AB (ref 6.5–8.1)

## 2014-10-16 LAB — GLUCOSE, CAPILLARY
GLUCOSE-CAPILLARY: 79 mg/dL (ref 65–99)
GLUCOSE-CAPILLARY: 95 mg/dL (ref 65–99)
GLUCOSE-CAPILLARY: 103 mg/dL — AB (ref 65–99)
Glucose-Capillary: 93 mg/dL (ref 65–99)
Glucose-Capillary: 87 mg/dL (ref 65–99)

## 2014-10-16 LAB — CULTURE, RESPIRATORY W GRAM STAIN: Gram Stain: NONE SEEN

## 2014-10-16 LAB — CBC WITH DIFFERENTIAL/PLATELET
BASOS ABS: 0 10*3/uL (ref 0.0–0.1)
Basophils Relative: 1 %
EOS PCT: 2 %
Eosinophils Absolute: 0.1 10*3/uL (ref 0.0–0.7)
HEMATOCRIT: 25.5 % — AB (ref 39.0–52.0)
HEMOGLOBIN: 7.8 g/dL — AB (ref 13.0–17.0)
LYMPHS ABS: 0.6 10*3/uL — AB (ref 0.7–4.0)
LYMPHS PCT: 16 %
MCH: 32.6 pg (ref 26.0–34.0)
MCHC: 30.6 g/dL (ref 30.0–36.0)
MCV: 106.7 fL — AB (ref 78.0–100.0)
MONOS PCT: 26 %
Monocytes Absolute: 1 10*3/uL (ref 0.1–1.0)
NEUTROS ABS: 2.1 10*3/uL (ref 1.7–7.7)
Neutrophils Relative %: 55 %
Platelets: 109 10*3/uL — ABNORMAL LOW (ref 150–400)
RBC: 2.39 MIL/uL — AB (ref 4.22–5.81)
RDW: 19.6 % — AB (ref 11.5–15.5)
WBC: 3.8 10*3/uL — AB (ref 4.0–10.5)

## 2014-10-16 LAB — MAGNESIUM: MAGNESIUM: 1.9 mg/dL (ref 1.7–2.4)

## 2014-10-16 LAB — CULTURE, RESPIRATORY

## 2014-10-16 LAB — FOLATE: Folate: 62.1 ng/mL (ref >5.9)

## 2014-10-16 LAB — VITAMIN B12: VITAMIN B 12: 1414 pg/mL — AB (ref 180–914)

## 2014-10-16 MED ORDER — DEXTROSE-NACL 5-0.45 % IV SOLN
INTRAVENOUS | Status: DC
  Administered 2014-10-16 (×2): via INTRAVENOUS

## 2014-10-16 MED ORDER — KETOCONAZOLE 2 % EX CREA
TOPICAL_CREAM | Freq: Every day | CUTANEOUS | Status: DC | PRN
  Filled 2014-10-16: qty 15

## 2014-10-16 MED ORDER — CIPROFLOXACIN IN D5W 400 MG/200ML IV SOLN
400.0000 mg | Freq: Two times a day (BID) | INTRAVENOUS | Status: DC
  Administered 2014-10-16 – 2014-10-20 (×9): 400 mg via INTRAVENOUS
  Filled 2014-10-16 (×9): qty 200

## 2014-10-16 NOTE — Progress Notes (Signed)
UR COMPLETED  

## 2014-10-16 NOTE — Progress Notes (Signed)
Per request of PT father- RT placed PT on BiPAP- settings and measurements on EPIC. RN aware.

## 2014-10-16 NOTE — Progress Notes (Signed)
[place] TEAM 1 - Stepdown/ICU TEAM Progress Note  Jeffery [name] YIF:[id number] DOB: Oct 25, 1990 DOA: 10/09/2014 PCP: Marijean Bravo, MD  Admit HPI / Brief Narrative: 24 y.o. WM PMHx cerebral palsy, spastic quadriplegia, wheelchair mobile, seizure disorder, has Port-A-Cath, PEG tube, hx of pancytopenia attributed to [place] being managed by oncology with transfusions as needed as well as Neulasta, obstructive sleep apnea on BiPAP daily at bedtime, history of recurrent aspiration pneumonia, status post PEG/J-tube placement presented to the ED with worsening shortness of breath over the past week requiring 02, respiratory distress. Approximate 1 week prior to admission patient was diagnosed with a right middle and right lower lobe pneumonia treated in the outpatient setting by Dr. Marin Olp (oncology) with IM Rocephin 2 and subsequently on oral cefdinir with no significant improvement. Patient presented with increased work of breathing, productive cough of thickened secretions, fever with temperature as high as 105 rectally. Patient was noted to be septic with a fever, tachycardic with heart rate in the 140s, and increased respiratory rate as high as 45. Chest x-ray done actually did show some improvement from prior chest x-ray.  HPI/Subjective: Patient had a fever of 102.9 yesterday, repeat cultures. Hold on discontinuing Port-A-Cath as the previous bacteremia is gram-negative.  Assessment/Plan: Acute respiratory distress with Hypoxia -Likely secondary to partially treated HCAP vs Aspiration pneumonia.  -Lactic acid continues to climb despite appropriate antibiotics and fluids.   -Trach aspirate positive Enterobacter resistant to Zosyn  -O2/BiPAP as needed -Continue Physiotherapy with vibrator; avoid his Port-A-Cath right chest wall -Bronchial dilators, mucolytics, antitussives and oxygen as needed.  HCAP +/- aspiration pneumonia -9/9 CXR; worsening RLL atelectasis/pneumonia  -Tracheal  aspirate positive GNR;(ENTEROBACTER AEROGENES), resistant to Zosyn,  -Was on levofloxacin, switched to cefepime  Severe Sepsis likely secondary to probable pneumonia/funguria (candidal UTI) -Repeat CXR; worsening atelectasis/pneumonia RLL -Increasing lactic acidosis.  -9/11 Panculture, obtain PCXR -Start, fluconazole IV 400 mg 1; then 100 mg daily  Lactic acidosis -Lactic acid is still elevated. At 5.3 on 9/13. - See acute respiratory distress  Hypothermia/hyperthermia -Bear hugger/cooling blanket  maintain body temp 37C  dysphagia status post PEG/ J tube placement -When not on BiPAP resume tube feeds   seizures/generalized convulsive epilepsy -9/8 Tonic/clonic seizures 2 witnessed today. Most likely secondary to patient's continued elevated temperature  -Seizure protocol -Continue Diastat rectal kit (NOTE Versed CAN NOT be used in conjunction with this medication)  -Continue CloBazam (Onfi) 6.25 mg TID.  Cerebral Palsy/spastic quadriplegic -Continue baclofen pump. -Patient receiving Botox injections every 3 months per neurology area outpatient follow-up.  Pancytopenia -Being seen by his oncologist Dr. Marin Olp. -S/P 2 units PRBC last week as outpatient  -Continue Neupogen 480 g daily . -9/9 will transfuse 1 unit PRBC  Hypokalemia -WNL   Code Status: DO NOT RESUSCITATE Family Communication: Father present at time of exam Disposition Plan: Resolution sepsis    Consultants: Dr. Marin Olp (oncology)   Procedure/Significant Events: S/P 2 units PRBC last week as outpatient  -9/9 will transfuse 1 unit PRBC   Culture 9/7 blood Porta cath/left hand NGTD 9/7 urine positive multiple species  9/7 trach aspirate positive GNR (ENTEROBACTER AEROGENES), resistant to Zosyn 9/7 MRSA by PCR negative 9/7 strep pneumo urine antigen/Legionella urine antigen negative 9/9 urine negative 9/11 blood left hand 2 NGTD 9/12 urine positive yeast> 100,000 colonies 9/13 sputum  pending   Antibiotics: Zosyn 9/7>> stopped 9/9 Vancomycin 9/7 >>stopped 9/10 Levofloxacin 9/9>>  Diflucan 9/13>>   DVT prophylaxis: SCD   Devices    LINES /  TUBES:      Continuous Infusions: . baclofen    . dextrose 5 % and 0.45% NaCl 75 mL/hr at 10/16/14 0145    Objective: VITAL SIGNS: Temp: 97 F (36.1 C) (09/14 0801) Temp Source: Oral (09/14 0801) BP: 136/90 mmHg (09/14 1000) Pulse Rate: 89 (09/14 1000) SPO2; FIO2:   Intake/Output Summary (Last 24 hours) at 10/16/14 1147 Last data filed at 10/16/14 1037  Gross per 24 hour  Intake   2075 ml  Output      0 ml  Net   2075 ml     Exam: General: Lethargic but arousable, appears somewhat uncomfortable in bed, opens eyes to stimulation, does not follow commands negative acute respiratory distress, positive chronic respiratory distress Eyes: Pupils equal round reactive to light and accommodation, negative scleral hemorrhage ENT: Negative Runny nose, negative gingival bleeding, Neck:  Negative scars, masses, torticollis, lymphadenopathy, JVD Lungs: diffuse rhonchi Rt>>Lt, without wheezes or crackles, somewhat improved Cardiovascular: Tachycardic Regular rate without murmur gallop or rub normal S1 and S2 Abdomen:negative abdominal pain, nondistended, positive soft, bowel sounds, no rebound, no ascites, no appreciable mass, PEG tube in place negative sign of infection Extremities: bilateral lower extremity deformed extremities secondary to cerebral palsy; bilateral distal and hips secondary to cerebral palsy Psychiatric:  Unable to assess  Neurologic:  Withdraws to pain unable to complete assessment    Data Reviewed: Basic Metabolic Panel:  Recent Labs Lab 10/12/14 0340 10/13/14 0345 10/14/14 0500 10/15/14 0424 10/16/14 0500  NA 140 141 141 145 142  K 3.2* 3.7 3.4* 4.7 3.8  CL 108 110 110 111 106  CO2 24 25 23 26 28   GLUCOSE 96 79 86 91 89  BUN 13 10 12 17 16   CREATININE 0.78 0.82 0.81 0.96 0.86    CALCIUM 8.1* 8.3* 8.1* 9.1 8.9  MG 1.7 1.8 2.4 2.1 1.9  PHOS 3.9  --   --   --   --    Liver Function Tests:  Recent Labs Lab 10/12/14 0340 10/13/14 0345 10/14/14 0500 10/15/14 0424 10/16/14 0500  AST 32 32 22 25 19   ALT 58 55 33 30 23  ALKPHOS 98 114 89 125 111  BILITOT 0.7 0.6 0.8 1.0 1.1  PROT 4.3* 4.1* 4.5* 5.0* 5.0*  ALBUMIN 1.7* 1.6* 2.6* 2.7* 2.8*    Recent Labs Lab 10/15/14 1625  LIPASE 20*  AMYLASE 42   No results for input(s): AMMONIA in the last 168 hours. CBC:  Recent Labs Lab 10/12/14 0340 10/13/14 0345 10/14/14 0500 10/15/14 0424 10/16/14 0500  WBC 9.0 7.7 4.1 4.4 3.8*  NEUTROABS 7.6 6.2 3.1 2.8 2.1  HGB 9.3* 9.0* 7.8* 8.4* 7.8*  HCT 28.8* 28.4* 24.8* 27.0* 25.5*  MCV 102.9* 103.6* 102.9* 105.9* 106.7*  PLT 102* 105* 94* 115* 109*   Cardiac Enzymes: No results for input(s): CKTOTAL, CKMB, CKMBINDEX, TROPONINI in the last 168 hours. BNP (last 3 results) No results for input(s): BNP in the last 8760 hours.  ProBNP (last 3 results) No results for input(s): PROBNP in the last 8760 hours.  CBG:  Recent Labs Lab 10/15/14 1432 10/15/14 2016 10/15/14 2259 10/16/14 0418 10/16/14 0851  GLUCAP 84 89 84 79 95    Recent Results (from the past 240 hour(s))  Blood Culture (routine x 2)     Status: None   Collection Time: 10/09/14  7:37 AM  Result Value Ref Range Status   Specimen Description BLOOD PORTA CATH  Final   Special Requests BOTTLES DRAWN AEROBIC  AND ANAEROBIC 5CC  Final   Culture NO GROWTH 5 DAYS  Final   Report Status 10/14/2014 FINAL  Final  Urine culture     Status: None   Collection Time: 10/09/14  7:41 AM  Result Value Ref Range Status   Specimen Description URINE, CATHETERIZED  Final   Special Requests NONE  Final   Culture MULTIPLE SPECIES PRESENT, SUGGEST RECOLLECTION  Final   Report Status 10/10/2014 FINAL  Final  Blood Culture (routine x 2)     Status: None   Collection Time: 10/09/14  7:57 AM  Result Value Ref  Range Status   Specimen Description BLOOD HAND LEFT  Final   Special Requests BOTTLES DRAWN AEROBIC ONLY 5CC  Final   Culture NO GROWTH 5 DAYS  Final   Report Status 10/14/2014 FINAL  Final  Culture, respiratory (NON-Expectorated)     Status: None   Collection Time: 10/09/14  2:00 PM  Result Value Ref Range Status   Specimen Description TRACHEAL ASPIRATE  Final   Special Requests Normal  Final   Gram Stain   Final    RARE WBC PRESENT, PREDOMINANTLY MONONUCLEAR RARE SQUAMOUS EPITHELIAL CELLS PRESENT NO ORGANISMS SEEN Performed at [facility]    Culture   Final    MODERATE ENTEROBACTER AEROGENES Performed at [facility]    Report Status 10/11/2014 FINAL  Final   Organism ID, Bacteria ENTEROBACTER AEROGENES  Final      Susceptibility   Enterobacter aerogenes - MIC*    CEFAZOLIN >=64 RESISTANT Resistant     CEFEPIME <=1 SENSITIVE Sensitive     CEFTAZIDIME >=64 RESISTANT Resistant     CEFTRIAXONE >=64 RESISTANT Resistant     CIPROFLOXACIN <=0.25 SENSITIVE Sensitive     GENTAMICIN <=1 SENSITIVE Sensitive     IMIPENEM <=0.25 SENSITIVE Sensitive     PIP/TAZO >=128 RESISTANT Resistant     TOBRAMYCIN <=1 SENSITIVE Sensitive     TRIMETH/SULFA <=20 SENSITIVE Sensitive     * MODERATE ENTEROBACTER AEROGENES  MRSA PCR Screening     Status: None   Collection Time: 10/09/14  4:04 PM  Result Value Ref Range Status   MRSA by PCR NEGATIVE NEGATIVE Final    Comment:        The GeneXpert MRSA Assay (FDA approved for NASAL specimens only), is one component of a comprehensive MRSA colonization surveillance program. It is not intended to diagnose MRSA infection nor to guide or monitor treatment for MRSA infections.   Culture, Urine     Status: None   Collection Time: 10/11/14 11:40 AM  Result Value Ref Range Status   Specimen Description URINE, RANDOM  Final   Special Requests NONE  Final   Culture NO GROWTH 1 DAY  Final   Report Status 10/12/2014 FINAL  Final   Culture, blood (routine x 2)     Status: None (Preliminary result)   Collection Time: 10/13/14  6:50 PM  Result Value Ref Range Status   Specimen Description BLOOD LEFT HAND  Final   Special Requests IN PEDIATRIC BOTTLE 1CC  Final   Culture NO GROWTH 2 DAYS  Final   Report Status PENDING  Incomplete  Culture, blood (routine x 2)     Status: None (Preliminary result)   Collection Time: 10/13/14  7:15 PM  Result Value Ref Range Status   Specimen Description BLOOD LEFT HAND  Final   Special Requests IN PEDIATRIC BOTTLE 2CC  Final   Culture NO GROWTH 2 DAYS  Final  Report Status PENDING  Incomplete  Culture, Urine     Status: None   Collection Time: 10/14/14 12:20 AM  Result Value Ref Range Status   Specimen Description URINE, CATHETERIZED  Final   Special Requests NONE  Final   Culture >=100,000 COLONIES/mL YEAST  Final   Report Status 10/15/2014 FINAL  Final  Culture, respiratory (NON-Expectorated)     Status: None   Collection Time: 10/14/14  1:00 AM  Result Value Ref Range Status   Specimen Description TRACHEAL ASPIRATE  Final   Special Requests NONE  Final   Gram Stain   Final    NO WBC SEEN MODERATE SQUAMOUS EPITHELIAL CELLS PRESENT NO ORGANISMS SEEN Performed at [facility]    Culture   Final    Non-Pathogenic Oropharyngeal-type Flora Isolated. Performed at [facility]    Report Status 10/16/2014 FINAL  Final     Studies:  Recent x-ray studies have been reviewed in detail by the Attending Physician  Scheduled Meds:  Scheduled Meds: . arformoterol  15 mcg Nebulization BID  . budesonide (PULMICORT) nebulizer solution  0.5 mg Nebulization BID  . cloBAZam  6.25 mg Per Tube 3 times per day  . diphenhydrAMINE  25 mg Intravenous Daily  . feeding supplement (PRO-STAT SUGAR FREE 64)  30 mL Per Tube BID  . fluconazole (DIFLUCAN) IV  200 mg Intravenous Q24H  . folic acid  1 mg Intravenous Daily  . free water  180 mL Per Tube Q4H  . gabapentin  900 mg  Per Tube 3 times per day  . guaiFENesin  30 mL Per Tube BID AC  . insulin aspart  0-9 Units Subcutaneous 6 times per day  . levalbuterol  0.63 mg Nebulization Q6H  . levofloxacin (LEVAQUIN) IV  750 mg Intravenous Q24H  . metoCLOPramide  10 mg Per Tube TID AC & HS  . multivitamin  10 mL Per Tube Daily  . omeprazole  20 mg Oral BID  . potassium chloride  40 mEq Per Tube BID  . Pseudoephedrine HCl  30 mg Per J Tube 3 times per day  . sodium chloride  3 mL Intravenous Q12H  . sucralfate  1 g Per Tube TID BM  . thiamine  100 mg Intravenous Daily  . TWOCAL HN  545 mL Per Tube Q24H    Time spent on care of this patient: 40 mins   Birdie Hopes , MD  [facility] Office  [phone] Pager - [phone]  On-Call/Text Page:      [email or web address]      password [email or web address]  If 7PM-7AM, please contact night-coverage [email or web address] Password [facility] 10/16/2014, 11:47 AM   LOS: 7 days   Care during the described time interval was provided by me .  I have reviewed this patient's available data, including medical history, events of note, physical examination, and all test results as part of my evaluation. I have personally reviewed and interpreted all radiology studies.   Birdie Hopes, MD Pager [phone]

## 2014-10-16 NOTE — Progress Notes (Signed)
He did not require a thoracentesis yesterday. Apparently, the radiologist did not feel that there is enough fluid to be able to be safely taken off.  He, overall, is about the same. He continues to have some temperatures.  He had a CT of the abdomen yesterday. This showed some splenomegaly. I think this is nonspecific. Liver looked okay. He had the bilateral pleural effusions. There is no lymphadenopathy.  His labs today showed his white cell count be 3.8. Hemoglobin 7.8. Platelet count 109,000. He had a fairly normal white cell differential although the monocytes were up a little bit.  He continues on IV antibiotics.  I cannot find anything new on his physical exam. I do not see any rashes. I cannot palpate his spleen.  He has yeast growing in his urine. I probably would treat this. I would put him on some Diflucan.  I appreciate Roberti's help. This is a very difficult situation. The cerebral palsy I think is finally starting to become more and issue. I think that there might be some intrinsic bone marrow dysfunction that we might be dealing with even though the bone marrow test that he had done really did not show [place] much. He had normal cytogenetics with the bone marrow test.  [name]

## 2014-10-16 NOTE — Progress Notes (Signed)
RT removed PT from BiPAP and placed on 40% VM- RN aware. Also, RT NTS and sample to RN.

## 2014-10-16 NOTE — Progress Notes (Signed)
Nutrition Follow-up  DOCUMENTATION CODES:   Not applicable  INTERVENTION:    Continue home TF regimen: TwoCal HN at 46 ml/h x 12 hours via J-tube (Parents bringing in TwoCal HN from home)  Continue Prostat 30 ml BID  Continue 180 ml free water flushes 6 times per day  Total intake is 1304 kcals, 76 gm protein, 1467 ml free water daily (including flushes)  NUTRITION DIAGNOSIS:   Inadequate oral intake related to inability to eat as evidenced by NPO status.  Ongoing  GOAL:   Patient will meet greater than or equal to 90% of their needs  Met  MONITOR:   Labs, Weight trends, TF tolerance  REASON FOR ASSESSMENT:   Consult Enteral/tube feeding initiation and management  ASSESSMENT:   24 y.o. male, with cerebral palsy, spastic quadriplegia, generalized convulsive epilepsy, pancytopenia, obstructive sleep apnea on BiPAP at night, and recurrent aspiration pneumonia (PEG/J tube in place).Admitted with suspected acute aspiration event and sepsis. Urine positive multiple species . Trach aspirate positive for enterobacter aerogenes.   Patient with fever spike on 9/13; being treated for acute respiratory distress with hypoxia, HCAP, severe sepsis. Thoracentesis was cancelled on 9/13 due to effusions being too small to safely proceed percutaneously. Tolerating home TF regimen well per discussion with RN. TF was held yesterday, resumed this AM. He is receiving 545 ml Two Cal HN per day plus Prostat 30 ml BID.  Diet Order:  Diet NPO time specified  Skin:  Reviewed, no issues  Last BM:  9/10  Height:   Ht Readings from Last 1 Encounters:  10/14/14 4' 2"  (1.27 m)    Weight:   Wt Readings from Last 1 Encounters:  10/16/14 144 lb 13.5 oz (65.7 kg)    BMI:  Body mass index is 40.73 kg/(m^2).  Estimated Nutritional Needs:   Kcal:  1250-1450  Protein:  65-80 gm  Fluid:  1.5 L  EDUCATION NEEDS:   No education needs identified at this time  Molli Barrows, [place], [place],  [place] Pager [phone] After Hours Pager [phone]

## 2014-10-16 NOTE — Progress Notes (Signed)
PT father refused 1200 (1240) Chest Vest- PT is sleeping at this time. RN aware.

## 2014-10-16 NOTE — Progress Notes (Signed)
PT father requested CPT scheduled for 1200 be delayed due to meds just given by RN- RT spoke with RN and RT will complete CPT at approx 1240.

## 2014-10-17 DIAGNOSIS — J690 Pneumonitis due to inhalation of food and vomit: Secondary | ICD-10-CM

## 2014-10-17 DIAGNOSIS — R1310 Dysphagia, unspecified: Secondary | ICD-10-CM

## 2014-10-17 LAB — CBC WITH DIFFERENTIAL/PLATELET
BASOS ABS: 0 10*3/uL (ref 0.0–0.1)
Basophils Relative: 1 %
EOS ABS: 0.1 10*3/uL (ref 0.0–0.7)
Eosinophils Relative: 4 %
HCT: 25 % — ABNORMAL LOW (ref 39.0–52.0)
HEMOGLOBIN: 7.7 g/dL — AB (ref 13.0–17.0)
LYMPHS PCT: 12 %
Lymphs Abs: 0.4 10*3/uL — ABNORMAL LOW (ref 0.7–4.0)
MCH: 32.8 pg (ref 26.0–34.0)
MCHC: 30.8 g/dL (ref 30.0–36.0)
MCV: 106.4 fL — ABNORMAL HIGH (ref 78.0–100.0)
MONOS PCT: 21 %
Monocytes Absolute: 0.7 10*3/uL (ref 0.1–1.0)
NEUTROS PCT: 62 %
Neutro Abs: 2.1 10*3/uL (ref 1.7–7.7)
PLATELETS: 110 10*3/uL — AB (ref 150–400)
RBC: 2.35 MIL/uL — AB (ref 4.22–5.81)
RDW: 18.9 % — ABNORMAL HIGH (ref 11.5–15.5)
WBC MORPHOLOGY: INCREASED
WBC: 3.3 10*3/uL — AB (ref 4.0–10.5)

## 2014-10-17 LAB — BASIC METABOLIC PANEL
ANION GAP: 10 (ref 5–15)
BUN: 14 mg/dL (ref 6–20)
CALCIUM: 8.7 mg/dL — AB (ref 8.9–10.3)
CO2: 28 mmol/L (ref 22–32)
Chloride: 103 mmol/L (ref 101–111)
Creatinine, Ser: 0.72 mg/dL (ref 0.61–1.24)
GFR calc Af Amer: >60 mL/min (ref >60)
Glucose, Bld: 102 mg/dL — ABNORMAL HIGH (ref 65–99)
POTASSIUM: 4.1 mmol/L (ref 3.5–5.1)
SODIUM: 141 mmol/L (ref 135–145)

## 2014-10-17 LAB — MAGNESIUM: MAGNESIUM: 1.6 mg/dL — AB (ref 1.7–2.4)

## 2014-10-17 LAB — GLUCOSE, CAPILLARY
GLUCOSE-CAPILLARY: 85 mg/dL (ref 65–99)
GLUCOSE-CAPILLARY: 88 mg/dL (ref 65–99)
GLUCOSE-CAPILLARY: 117 mg/dL — AB (ref 65–99)
GLUCOSE-CAPILLARY: 97 mg/dL (ref 65–99)
Glucose-Capillary: 91 mg/dL (ref 65–99)
Glucose-Capillary: 107 mg/dL — ABNORMAL HIGH (ref 65–99)

## 2014-10-17 LAB — LACTIC ACID, PLASMA: LACTIC ACID, VENOUS: 4.3 mmol/L — AB (ref 0.5–2.0)

## 2014-10-17 MED ORDER — FUROSEMIDE 10 MG/ML IJ SOLN
40.0000 mg | Freq: Once | INTRAMUSCULAR | Status: AC
  Administered 2014-10-17: 40 mg via INTRAVENOUS
  Filled 2014-10-17: qty 4

## 2014-10-17 NOTE — Progress Notes (Signed)
CPT delayed due to meds just being given

## 2014-10-17 NOTE — Progress Notes (Signed)
CPT held at this time due to parent preference; NTS held for same reason. Pt resting comfortably at this time; BBS clear;diminished. sats 98% on 2L nasal cannula. Patient's father requested that CPT be held throughout the night so that patient can rest. Will continue to monitor.

## 2014-10-17 NOTE — Progress Notes (Signed)
CRITICAL VALUE ALERT  Critical value received:lactic acid 4.3    Date of notification:  9 15 16   Time of notification:  1410  Critical value read back:Yes.    Nurse who received alert:   Consuelo Pandy RN  MD notified (1st page):  Dr Hartford Poli  Time of first page:  [phone]  MD notified (2nd page):  Time of second page:  Responding MD:  Dr Hartford Poli  Time MD responded:  [phone]

## 2014-10-17 NOTE — Progress Notes (Signed)
Jeffery [name] is looking a little better this morning.  He has been afebrile. So far, all cultures have been negative. He does have the Port-A-Cath in. The Port-A-Cath site looks okay. There's been no problems with positive blood cultures.Hopefully, the Port-A-Cath will not be a problem and not a site for the fevers. I would think it would not be an issue given that he has had negative blood cultures.  He did have some fungus in his urine. He is on Diflucan.  hhis CBC is not yet back. His metabolic panel looks okay..  On his physical exam, his vital signs are stable. His temperature is 97.9. Blood pressure was 120/80. Pulse is 90.  tthere is nothing new on his physical exam, I really cannot feel his spleen. He has no fluid wave. His lungs sound fairly clear with some scattered expiratory wheezes.  Skin exam shows no rashes. I don't see any erythema about the Port-A-Cath site.  For right now, I know have any thing new to add. I think that he does  Begin to spike temperatures again, then the Port-A-Cath  And a half to come out need despite not having positive blood cultures.if, he does have cultures that are positive for staph in the blood, then I think the Port-A-Cath will have to come out. If not, I hear be at significant risk for endocarditis.  His dad was with him this morning.. I reviewed everything with him.  [name]  Ephesians 5:15

## 2014-10-17 NOTE — Progress Notes (Signed)
[facility] TEAM 1 - Stepdown/ICU TEAM Progress Note  Jeffery [name] OZD:[id number] DOB: Mar 30, 1990 DOA: 10/09/2014 PCP: Marijean Bravo, MD  Admit HPI / Brief Narrative: 24 y.o. WM PMHx cerebral palsy, spastic quadriplegia, wheelchair mobile, seizure disorder, has Port-A-Cath, PEG tube, hx of pancytopenia attributed to [place] being managed by oncology with transfusions as needed as well as Neulasta, obstructive sleep apnea on BiPAP daily at bedtime, history of recurrent aspiration pneumonia, status post PEG/J-tube placement presented to the ED with worsening shortness of breath over the past week requiring 02, respiratory distress. Approximate 1 week prior to admission patient was diagnosed with a right middle and right lower lobe pneumonia treated in the outpatient setting by Dr. Marin Olp (oncology) with IM Rocephin 2 and subsequently on oral cefdinir with no significant improvement. Patient presented with increased work of breathing, productive cough of thickened secretions, fever with temperature as high as 105 rectally. Patient was noted to be septic with a fever, tachycardic with heart rate in the 140s, and increased respiratory rate as high as 45. Chest x-ray done actually did show some improvement from prior chest x-ray.  HPI/Subjective: Seen with father at bedside, patient is nonverbal. Does not appear  to be in distress or SOB.  Assessment/Plan: Acute respiratory distress with Hypoxia -Likely secondary to partially treated HCAP vs Aspiration pneumonia.  -Lactic acid continues to climb despite appropriate antibiotics and fluids.   -Trach aspirate positive Enterobacter resistant to Zosyn  -O2/BiPAP as needed -Continue Physiotherapy with vibrator; avoid his Port-A-Cath right chest wall -Bronchial dilators, mucolytics, antitussives and oxygen as needed. Enterobacter is sensitive to Cipro.  HCAP +/- aspiration pneumonia -9/9 CXR; worsening RLL atelectasis/pneumonia  -Tracheal aspirate  positive GNR;(ENTEROBACTER AEROGENES), resistant to Zosyn,  -Was on levofloxacin, switched to Cipro, wanted to do cefepime but on national backorder.  Severe Sepsis likely secondary to probable pneumonia/funguria (candidal UTI) -Repeat CXR; worsening atelectasis/pneumonia RLL -Increasing lactic acidosis.  -9/11 Panculture, obtain PCXR -Start, fluconazole IV 400 mg 1; then 200 mg daily.  Colitis -CT scan of abdomen and pelvis showed possible colitis. -Patient is on ciprofloxacin. Family mentioned possible constipation, had large bowel movement with his CPT on 9/15.  Lactic acidosis -Lactic acid is still elevated. At 5.3 on 9/13. - See acute respiratory distress  Hypothermia/hyperthermia -Bair hugger/cooling blanket  maintain body temp 37C  dysphagia status post PEG/ J tube placement -When not on BiPAP resume tube feeds   seizures/generalized convulsive epilepsy -9/8 Tonic/clonic seizures 2 witnessed. Most likely secondary to patient's continued elevated temperature  -Seizure protocol -Continue Diastat rectal kit (NOTE Versed CAN NOT be used in conjunction with this medication)  -Continue CloBazam (Onfi) 6.25 mg TID.  Cerebral Palsy/spastic quadriplegic -Continue baclofen pump. -Patient receiving Botox injections every 3 months per neurology area outpatient follow-up.  Pancytopenia -Being seen by his oncologist Dr. Marin Olp. -S/P 2 units PRBC last week as outpatient  -Continue Neupogen 480 g daily . -9/9 will transfuse 1 unit PRBC  Hypokalemia -WNL   Code Status: DO NOT RESUSCITATE Family Communication: Father present at time of exam Disposition Plan: Resolution sepsis    Consultants: Dr. Marin Olp (oncology)   Procedure/Significant Events: S/P 2 units PRBC last week as outpatient  -9/9 will transfuse 1 unit PRBC   Culture 9/7 blood Porta cath/left hand NGTD 9/7 urine positive multiple species  9/7 trach aspirate positive GNR (ENTEROBACTER AEROGENES),  resistant to Zosyn 9/7 MRSA by PCR negative 9/7 strep pneumo urine antigen/Legionella urine antigen negative 9/9 urine negative 9/11 blood left hand  2 NGTD 9/12 urine positive yeast> 100,000 colonies 9/13 sputum pending   Antibiotics: Zosyn 9/7>> stopped 9/9 Vancomycin 9/7 >>stopped 9/10 Levofloxacin 9/9>>  Diflucan 9/13>>   DVT prophylaxis: SCD   Devices    LINES / TUBES:      Continuous Infusions: . baclofen      Objective: VITAL SIGNS: Temp: 98.7 F (37.1 C) (09/15 1127) Temp Source: Oral (09/15 1127) BP: 120/80 mmHg (09/15 0400) Pulse Rate: 88 (09/15 0400) SPO2; FIO2:   Intake/Output Summary (Last 24 hours) at 10/17/14 1212 Last data filed at 10/16/14 1800  Gross per 24 hour  Intake   1360 ml  Output      0 ml  Net   1360 ml     Exam: General: Lethargic but arousable, appears somewhat uncomfortable in bed, opens eyes to stimulation, does not follow commands negative acute respiratory distress, positive chronic respiratory distress Eyes: Pupils equal round reactive to light and accommodation, negative scleral hemorrhage ENT: Negative Runny nose, negative gingival bleeding, Neck:  Negative scars, masses, torticollis, lymphadenopathy, JVD Lungs: diffuse rhonchi Rt>>Lt, without wheezes or crackles, somewhat improved Cardiovascular: Tachycardic Regular rate without murmur gallop or rub normal S1 and S2 Abdomen:negative abdominal pain, nondistended, positive soft, bowel sounds, no rebound, no ascites, no appreciable mass, PEG tube in place negative sign of infection Extremities: bilateral lower extremity deformed extremities secondary to cerebral palsy; bilateral distal and hips secondary to cerebral palsy Psychiatric:  Unable to assess  Neurologic:  Withdraws to pain unable to complete assessment    Data Reviewed: Basic Metabolic Panel:  Recent Labs Lab 10/12/14 0340 10/13/14 0345 10/14/14 0500 10/15/14 0424 10/16/14 0500 10/17/14 0640  NA  140 141 141 145 142 141  K 3.2* 3.7 3.4* 4.7 3.8 4.1  CL 108 110 110 111 106 103  CO2 24 25 23 26 28 28   GLUCOSE 96 79 86 91 89 102*  BUN 13 10 12 17 16 14   CREATININE 0.78 0.82 0.81 0.96 0.86 0.72  CALCIUM 8.1* 8.3* 8.1* 9.1 8.9 8.7*  MG 1.7 1.8 2.4 2.1 1.9 1.6*  PHOS 3.9  --   --   --   --   --    Liver Function Tests:  Recent Labs Lab 10/12/14 0340 10/13/14 0345 10/14/14 0500 10/15/14 0424 10/16/14 0500  AST 32 32 22 25 19   ALT 58 55 33 30 23  ALKPHOS 98 114 89 125 111  BILITOT 0.7 0.6 0.8 1.0 1.1  PROT 4.3* 4.1* 4.5* 5.0* 5.0*  ALBUMIN 1.7* 1.6* 2.6* 2.7* 2.8*    Recent Labs Lab 10/15/14 1625  LIPASE 20*  AMYLASE 42   No results for input(s): AMMONIA in the last 168 hours. CBC:  Recent Labs Lab 10/13/14 0345 10/14/14 0500 10/15/14 0424 10/16/14 0500 10/17/14 0640  WBC 7.7 4.1 4.4 3.8* 3.3*  NEUTROABS 6.2 3.1 2.8 2.1 2.1  HGB 9.0* 7.8* 8.4* 7.8* 7.7*  HCT 28.4* 24.8* 27.0* 25.5* 25.0*  MCV 103.6* 102.9* 105.9* 106.7* 106.4*  PLT 105* 94* 115* 109* 110*   Cardiac Enzymes: No results for input(s): CKTOTAL, CKMB, CKMBINDEX, TROPONINI in the last 168 hours. BNP (last 3 results) No results for input(s): BNP in the last 8760 hours.  ProBNP (last 3 results) No results for input(s): PROBNP in the last 8760 hours.  CBG:  Recent Labs Lab 10/16/14 1421 10/16/14 1923 10/16/14 2301 10/17/14 0400 10/17/14 0733  GLUCAP 93 87 85 88 91    Recent Results (from the past 240 hour(s))  Blood  Culture (routine x 2)     Status: None   Collection Time: 10/09/14  7:37 AM  Result Value Ref Range Status   Specimen Description BLOOD PORTA CATH  Final   Special Requests BOTTLES DRAWN AEROBIC AND ANAEROBIC 5CC  Final   Culture NO GROWTH 5 DAYS  Final   Report Status 10/14/2014 FINAL  Final  Urine culture     Status: None   Collection Time: 10/09/14  7:41 AM  Result Value Ref Range Status   Specimen Description URINE, CATHETERIZED  Final   Special Requests NONE   Final   Culture MULTIPLE SPECIES PRESENT, SUGGEST RECOLLECTION  Final   Report Status 10/10/2014 FINAL  Final  Blood Culture (routine x 2)     Status: None   Collection Time: 10/09/14  7:57 AM  Result Value Ref Range Status   Specimen Description BLOOD HAND LEFT  Final   Special Requests BOTTLES DRAWN AEROBIC ONLY 5CC  Final   Culture NO GROWTH 5 DAYS  Final   Report Status 10/14/2014 FINAL  Final  Culture, respiratory (NON-Expectorated)     Status: None   Collection Time: 10/09/14  2:00 PM  Result Value Ref Range Status   Specimen Description TRACHEAL ASPIRATE  Final   Special Requests Normal  Final   Gram Stain   Final    RARE WBC PRESENT, PREDOMINANTLY MONONUCLEAR RARE SQUAMOUS EPITHELIAL CELLS PRESENT NO ORGANISMS SEEN Performed at [facility]    Culture   Final    MODERATE ENTEROBACTER AEROGENES Performed at [facility]    Report Status 10/11/2014 FINAL  Final   Organism ID, Bacteria ENTEROBACTER AEROGENES  Final      Susceptibility   Enterobacter aerogenes - MIC*    CEFAZOLIN >=64 RESISTANT Resistant     CEFEPIME <=1 SENSITIVE Sensitive     CEFTAZIDIME >=64 RESISTANT Resistant     CEFTRIAXONE >=64 RESISTANT Resistant     CIPROFLOXACIN <=0.25 SENSITIVE Sensitive     GENTAMICIN <=1 SENSITIVE Sensitive     IMIPENEM <=0.25 SENSITIVE Sensitive     PIP/TAZO >=128 RESISTANT Resistant     TOBRAMYCIN <=1 SENSITIVE Sensitive     TRIMETH/SULFA <=20 SENSITIVE Sensitive     * MODERATE ENTEROBACTER AEROGENES  MRSA PCR Screening     Status: None   Collection Time: 10/09/14  4:04 PM  Result Value Ref Range Status   MRSA by PCR NEGATIVE NEGATIVE Final    Comment:        The GeneXpert MRSA Assay (FDA approved for NASAL specimens only), is one component of a comprehensive MRSA colonization surveillance program. It is not intended to diagnose MRSA infection nor to guide or monitor treatment for MRSA infections.   Culture, Urine     Status: None   Collection  Time: 10/11/14 11:40 AM  Result Value Ref Range Status   Specimen Description URINE, RANDOM  Final   Special Requests NONE  Final   Culture NO GROWTH 1 DAY  Final   Report Status 10/12/2014 FINAL  Final  Culture, blood (routine x 2)     Status: None (Preliminary result)   Collection Time: 10/13/14  6:50 PM  Result Value Ref Range Status   Specimen Description BLOOD LEFT HAND  Final   Special Requests IN PEDIATRIC BOTTLE 1CC  Final   Culture NO GROWTH 3 DAYS  Final   Report Status PENDING  Incomplete  Culture, blood (routine x 2)     Status: None (Preliminary result)   Collection  Time: 10/13/14  7:15 PM  Result Value Ref Range Status   Specimen Description BLOOD LEFT HAND  Final   Special Requests IN PEDIATRIC BOTTLE 2CC  Final   Culture NO GROWTH 3 DAYS  Final   Report Status PENDING  Incomplete  Culture, Urine     Status: None   Collection Time: 10/14/14 12:20 AM  Result Value Ref Range Status   Specimen Description URINE, CATHETERIZED  Final   Special Requests NONE  Final   Culture >=100,000 COLONIES/mL YEAST  Final   Report Status 10/15/2014 FINAL  Final  Culture, respiratory (NON-Expectorated)     Status: None   Collection Time: 10/14/14  1:00 AM  Result Value Ref Range Status   Specimen Description TRACHEAL ASPIRATE  Final   Special Requests NONE  Final   Gram Stain   Final    NO WBC SEEN MODERATE SQUAMOUS EPITHELIAL CELLS PRESENT NO ORGANISMS SEEN Performed at [facility]    Culture   Final    Non-Pathogenic Oropharyngeal-type Flora Isolated. Performed at [facility]    Report Status 10/16/2014 FINAL  Final  Culture, respiratory (NON-Expectorated)     Status: None (Preliminary result)   Collection Time: 10/16/14  8:59 AM  Result Value Ref Range Status   Specimen Description TRACHEAL ASPIRATE  Final   Special Requests Normal  Final   Gram Stain   Final    RARE WBC PRESENT, PREDOMINANTLY PMN FEW SQUAMOUS EPITHELIAL CELLS PRESENT FEW GRAM  VARIABLE ROD RARE GRAM POSITIVE COCCI IN PAIRS    Culture   Final    Culture reincubated for better growth Performed at [facility]    Report Status PENDING  Incomplete     Studies:  Recent x-ray studies have been reviewed in detail by the Attending Physician  Scheduled Meds:  Scheduled Meds: . arformoterol  15 mcg Nebulization BID  . budesonide (PULMICORT) nebulizer solution  0.5 mg Nebulization BID  . ciprofloxacin  400 mg Intravenous Q12H  . cloBAZam  6.25 mg Per Tube 3 times per day  . diphenhydrAMINE  25 mg Intravenous Daily  . feeding supplement (PRO-STAT SUGAR FREE 64)  30 mL Per Tube BID  . fluconazole (DIFLUCAN) IV  200 mg Intravenous Q24H  . folic acid  1 mg Intravenous Daily  . free water  180 mL Per Tube Q4H  . furosemide  40 mg Intravenous Once  . gabapentin  900 mg Per Tube 3 times per day  . guaiFENesin  30 mL Per Tube BID AC  . insulin aspart  0-9 Units Subcutaneous 6 times per day  . levalbuterol  0.63 mg Nebulization Q6H  . metoCLOPramide  10 mg Per Tube TID AC & HS  . multivitamin  10 mL Per Tube Daily  . omeprazole  20 mg Oral BID  . potassium chloride  40 mEq Per Tube BID  . Pseudoephedrine HCl  30 mg Per J Tube 3 times per day  . sodium chloride  3 mL Intravenous Q12H  . sucralfate  1 g Per Tube TID BM  . thiamine  100 mg Intravenous Daily  . TWOCAL HN  545 mL Per Tube Q24H    Time spent on care of this patient: 40 mins   Birdie Hopes , MD  [facility] Office  [phone] Pager - [phone]  On-Call/Text Page:      [email or web address]      password [email or web address]  If 7PM-7AM, please contact night-coverage [email or web address] Password [email or web address] 10/17/2014, 12:12 PM  LOS: 8 days   Care during the described time interval was provided by me .  I have reviewed this patient's available data, including medical history, events of note, physical examination, and all test results as part of my evaluation. I have personally reviewed and interpreted all  radiology studies.   Birdie Hopes, MD Pager [phone]

## 2014-10-17 NOTE — Progress Notes (Signed)
Did not NTS at this time. Pt with large bowel movementn after CPT

## 2014-10-17 NOTE — Progress Notes (Signed)
Cpt not done due to meds being given at 1600. Parents do not want CPT done for 2 hours.

## 2014-10-18 ENCOUNTER — Inpatient Hospital Stay (HOSPITAL_COMMUNITY): Payer: Medicaid Other

## 2014-10-18 LAB — CBC WITH DIFFERENTIAL/PLATELET
Basophils Absolute: 0 10*3/uL (ref 0.0–0.1)
Basophils Relative: 1 %
EOS PCT: 4 %
Eosinophils Absolute: 0.1 10*3/uL (ref 0.0–0.7)
HCT: 25.2 % — ABNORMAL LOW (ref 39.0–52.0)
HEMOGLOBIN: 7.6 g/dL — AB (ref 13.0–17.0)
LYMPHS ABS: 0.4 10*3/uL — AB (ref 0.7–4.0)
LYMPHS PCT: 14 %
MCH: 32.6 pg (ref 26.0–34.0)
MCHC: 30.2 g/dL (ref 30.0–36.0)
MCV: 108.2 fL — ABNORMAL HIGH (ref 78.0–100.0)
MONOS PCT: 19 %
Monocytes Absolute: 0.5 10*3/uL (ref 0.1–1.0)
Neutro Abs: 1.5 10*3/uL — ABNORMAL LOW (ref 1.7–7.7)
Neutrophils Relative %: 62 %
Platelets: 104 10*3/uL — ABNORMAL LOW (ref 150–400)
RBC: 2.33 MIL/uL — AB (ref 4.22–5.81)
RDW: 18.6 % — ABNORMAL HIGH (ref 11.5–15.5)
WBC: 2.5 10*3/uL — AB (ref 4.0–10.5)

## 2014-10-18 LAB — CULTURE, BLOOD (ROUTINE X 2)
Culture: NO GROWTH
Culture: NO GROWTH

## 2014-10-18 LAB — BASIC METABOLIC PANEL
ANION GAP: 9 (ref 5–15)
BUN: 16 mg/dL (ref 6–20)
CHLORIDE: 105 mmol/L (ref 101–111)
CO2: 30 mmol/L (ref 22–32)
Calcium: 8.8 mg/dL — ABNORMAL LOW (ref 8.9–10.3)
Creatinine, Ser: 0.69 mg/dL (ref 0.61–1.24)
Glucose, Bld: 92 mg/dL (ref 65–99)
POTASSIUM: 4.5 mmol/L (ref 3.5–5.1)
SODIUM: 144 mmol/L (ref 135–145)

## 2014-10-18 LAB — GLUCOSE, CAPILLARY
GLUCOSE-CAPILLARY: 110 mg/dL — AB (ref 65–99)
GLUCOSE-CAPILLARY: 125 mg/dL — AB (ref 65–99)
GLUCOSE-CAPILLARY: 82 mg/dL (ref 65–99)
GLUCOSE-CAPILLARY: 94 mg/dL (ref 65–99)
GLUCOSE-CAPILLARY: 99 mg/dL (ref 65–99)
Glucose-Capillary: 100 mg/dL — ABNORMAL HIGH (ref 65–99)

## 2014-10-18 LAB — CULTURE, RESPIRATORY W GRAM STAIN: Special Requests: NORMAL

## 2014-10-18 LAB — PREPARE RBC (CROSSMATCH)

## 2014-10-18 LAB — HEMOGLOBIN AND HEMATOCRIT, BLOOD
HEMATOCRIT: 34.7 % — AB (ref 39.0–52.0)
Hemoglobin: 10.7 g/dL — ABNORMAL LOW (ref 13.0–17.0)

## 2014-10-18 LAB — MAGNESIUM: Magnesium: 1.6 mg/dL — ABNORMAL LOW (ref 1.7–2.4)

## 2014-10-18 LAB — CULTURE, RESPIRATORY

## 2014-10-18 MED ORDER — FUROSEMIDE 10 MG/ML IJ SOLN
20.0000 mg | Freq: Once | INTRAMUSCULAR | Status: AC
Start: 2014-10-18 — End: 2014-10-18
  Administered 2014-10-18: 20 mg via INTRAVENOUS
  Filled 2014-10-18: qty 2

## 2014-10-18 MED ORDER — FUROSEMIDE 10 MG/ML IJ SOLN
40.0000 mg | Freq: Once | INTRAMUSCULAR | Status: AC
  Administered 2014-10-18: 40 mg via INTRAVENOUS
  Filled 2014-10-18: qty 4

## 2014-10-18 MED ORDER — CHLORHEXIDINE GLUCONATE 4 % EX LIQD
CUTANEOUS | Status: AC
  Filled 2014-10-18: qty 15

## 2014-10-18 MED ORDER — TBO-FILGRASTIM 480 MCG/0.8ML ~~LOC~~ SOSY
480.0000 ug | PREFILLED_SYRINGE | Freq: Once | SUBCUTANEOUS | Status: AC
  Administered 2014-10-18: 480 ug via SUBCUTANEOUS
  Filled 2014-10-18: qty 0.8

## 2014-10-18 MED ORDER — IOHEXOL 300 MG/ML  SOLN
50.0000 mL | Freq: Once | INTRAMUSCULAR | Status: DC | PRN
  Administered 2014-10-18: 20 mL via INTRAVENOUS
  Filled 2014-10-18: qty 50

## 2014-10-18 MED ORDER — SODIUM CHLORIDE 0.9 % IV SOLN: Freq: Once | INTRAVENOUS | Status: AC

## 2014-10-18 MED ORDER — DIPHENHYDRAMINE HCL 50 MG/ML IJ SOLN
25.0000 mg | Freq: Once | INTRAMUSCULAR | Status: DC
  Filled 2014-10-18: qty 1

## 2014-10-18 NOTE — Progress Notes (Signed)
Patients father requesting that CPT be held at 2000 and for the rest of the night to allow patient to rest. Will continue to monitor.

## 2014-10-18 NOTE — Progress Notes (Signed)
[facility] TEAM 1 - Stepdown/ICU TEAM Progress Note  Jeffery [name] NOM:[id number] DOB: 1991/01/26 DOA: 10/09/2014 PCP: Marijean Bravo, MD  Admit HPI / Brief Narrative: 24 y.o. WM PMHx cerebral palsy, spastic quadriplegia, wheelchair mobile, seizure disorder, has Port-A-Cath, PEG tube, hx of pancytopenia attributed to [place] being managed by oncology with transfusions as needed as well as Neulasta, obstructive sleep apnea on BiPAP daily at bedtime, history of recurrent aspiration pneumonia, status post PEG/J-tube placement presented to the ED with worsening shortness of breath over the past week requiring 02, respiratory distress. Approximate 1 week prior to admission patient was diagnosed with a right middle and right lower lobe pneumonia treated in the outpatient setting by Dr. Marin Olp (oncology) with IM Rocephin 2 and subsequently on oral cefdinir with no significant improvement. Patient presented with increased work of breathing, productive cough of thickened secretions, fever with temperature as high as 105 rectally. Patient was noted to be septic with a fever, tachycardic with heart rate in the 140s, and increased respiratory rate as high as 45. Chest x-ray done actually did show some improvement from prior chest x-ray.  HPI/Subjective: Seen with father at bedside, patient is nonverbal. Patient is on his personal wheelchair, does not appear to be acute distress. Father asked about blood transfusion, and Neupogen shot.  Assessment/Plan: Acute respiratory distress with Hypoxia -Likely secondary to partially treated HCAP vs Aspiration pneumonia.  -Lactic acid continues to climb despite appropriate antibiotics and fluids.   -Trach aspirate positive Enterobacter resistant to Zosyn  -O2/BiPAP as needed -Continue Physiotherapy with vibrator; avoid his Port-A-Cath right chest wall -Bronchial dilators, mucolytics, antitussives and oxygen as needed. Enterobacter is sensitive to Cipro.  HCAP  +/- aspiration pneumonia -9/9 CXR; worsening RLL atelectasis/pneumonia  -Tracheal aspirate positive GNR;(ENTEROBACTER AEROGENES), resistant to Zosyn,  -Was on levofloxacin, switched to Cipro, wanted to do cefepime but on national backorder.  Severe Sepsis likely secondary to probable pneumonia/funguria (candidal UTI) -Repeat CXR; worsening atelectasis/pneumonia RLL -Increasing lactic acidosis.  -9/11 Panculture, obtain PCXR -Start, fluconazole IV 400 mg 1; then 200 mg daily.  Colitis -CT scan of abdomen and pelvis showed possible colitis. -Patient is on ciprofloxacin. Family mentioned possible constipation, had large bowel movement with his CPT on 9/15.  Lactic acidosis -Lactic acid is still elevated. At 5.3 on 9/13. - See acute respiratory distress  Hypothermia/hyperthermia -Bair hugger/cooling blanket  maintain body temp 37C  dysphagia status post PEG/ J tube placement -When not on BiPAP resume tube feeds   seizures/generalized convulsive epilepsy -9/8 Tonic/clonic seizures 2 witnessed. Most likely secondary to patient's continued elevated temperature  -Seizure protocol -Continue Diastat rectal kit (NOTE Versed CAN NOT be used in conjunction with this medication)  -Continue CloBazam (Onfi) 6.25 mg TID.  Cerebral Palsy/spastic quadriplegic -Continue baclofen pump. -Patient receiving Botox injections every 3 months per neurology area outpatient follow-up.  Pancytopenia -Being seen by his oncologist Dr. Marin Olp. -S/P 2 units PRBC last week as outpatient  -Continue Neupogen 480 g daily . -9/9 had transfusion 1 unit of packed RBCs, will transfuse another unit of packed RBC 9/16.  Hypokalemia -WNL   Code Status: DO NOT RESUSCITATE Family Communication: Father present at time of exam Disposition Plan: Resolution sepsis    Consultants: Dr. Marin Olp (oncology)   Procedure/Significant Events: S/P 2 units PRBC last week as outpatient  -9/9 will transfuse 1 unit  PRBC -9/16 Transfusion of 1 unit of pRBC  Culture 9/7 blood Porta cath/left hand NGTD 9/7 urine positive multiple species  9/7 trach aspirate  positive GNR (ENTEROBACTER AEROGENES), resistant to Zosyn 9/7 MRSA by PCR negative 9/7 strep pneumo urine antigen/Legionella urine antigen negative 9/9 urine negative 9/11 blood left hand 2 NGTD 9/12 urine positive yeast> 100,000 colonies 9/13 sputum pending   Antibiotics: Zosyn 9/7>> stopped 9/9 Vancomycin 9/7 >>stopped 9/10 Levofloxacin 9/9>>  Diflucan 9/13>>   DVT prophylaxis: SCD   Devices    LINES / TUBES:      Continuous Infusions: . baclofen      Objective: VITAL SIGNS: Temp: 97.4 F (36.3 C) (09/16 0747) Temp Source: Axillary (09/16 0747) BP: 114/69 mmHg (09/16 0500) Pulse Rate: 76 (09/16 0500) SPO2; FIO2:   Intake/Output Summary (Last 24 hours) at 10/18/14 1112 Last data filed at 10/18/14 0401  Gross per 24 hour  Intake   1362 ml  Output    625 ml  Net    737 ml     Exam: General: Lethargic but arousable, appears somewhat uncomfortable in bed, opens eyes to stimulation, does not follow commands negative acute respiratory distress, positive chronic respiratory distress Eyes: Pupils equal round reactive to light and accommodation, negative scleral hemorrhage ENT: Negative Runny nose, negative gingival bleeding, Neck:  Negative scars, masses, torticollis, lymphadenopathy, JVD Lungs: diffuse rhonchi Rt>>Lt, without wheezes or crackles, somewhat improved Cardiovascular: Tachycardic Regular rate without murmur gallop or rub normal S1 and S2 Abdomen:negative abdominal pain, nondistended, positive soft, bowel sounds, no rebound, no ascites, no appreciable mass, PEG tube in place negative sign of infection Extremities: bilateral lower extremity deformed extremities secondary to cerebral palsy; bilateral distal and hips secondary to cerebral palsy Psychiatric:  Unable to assess  Neurologic:  Withdraws to pain  unable to complete assessment    Data Reviewed: Basic Metabolic Panel:  Recent Labs Lab 10/12/14 0340  10/14/14 0500 10/15/14 0424 10/16/14 0500 10/17/14 0640 10/18/14 0450  NA 140  < > 141 145 142 141 144  K 3.2*  < > 3.4* 4.7 3.8 4.1 4.5  CL 108  < > 110 111 106 103 105  CO2 24  < > 23 26 28 28 30   GLUCOSE 96  < > 86 91 89 102* 92  BUN 13  < > 12 17 16 14 16   CREATININE 0.78  < > 0.81 0.96 0.86 0.72 0.69  CALCIUM 8.1*  < > 8.1* 9.1 8.9 8.7* 8.8*  MG 1.7  < > 2.4 2.1 1.9 1.6* 1.6*  PHOS 3.9  --   --   --   --   --   --   < > = values in this interval not displayed. Liver Function Tests:  Recent Labs Lab 10/12/14 0340 10/13/14 0345 10/14/14 0500 10/15/14 0424 10/16/14 0500  AST 32 32 22 25 19   ALT 58 55 33 30 23  ALKPHOS 98 114 89 125 111  BILITOT 0.7 0.6 0.8 1.0 1.1  PROT 4.3* 4.1* 4.5* 5.0* 5.0*  ALBUMIN 1.7* 1.6* 2.6* 2.7* 2.8*    Recent Labs Lab 10/15/14 1625  LIPASE 20*  AMYLASE 42   No results for input(s): AMMONIA in the last 168 hours. CBC:  Recent Labs Lab 10/14/14 0500 10/15/14 0424 10/16/14 0500 10/17/14 0640 10/18/14 0450  WBC 4.1 4.4 3.8* 3.3* 2.5*  NEUTROABS 3.1 2.8 2.1 2.1 1.5*  HGB 7.8* 8.4* 7.8* 7.7* 7.6*  HCT 24.8* 27.0* 25.5* 25.0* 25.2*  MCV 102.9* 105.9* 106.7* 106.4* 108.2*  PLT 94* 115* 109* 110* 104*   Cardiac Enzymes: No results for input(s): CKTOTAL, CKMB, CKMBINDEX, TROPONINI in the last 168  hours. BNP (last 3 results) No results for input(s): BNP in the last 8760 hours.  ProBNP (last 3 results) No results for input(s): PROBNP in the last 8760 hours.  CBG:  Recent Labs Lab 10/17/14 1457 10/17/14 2002 10/17/14 2355 10/18/14 0414 10/18/14 0743  GLUCAP 117* 97 99 82 94    Recent Results (from the past 240 hour(s))  Blood Culture (routine x 2)     Status: None   Collection Time: 10/09/14  7:37 AM  Result Value Ref Range Status   Specimen Description BLOOD PORTA CATH  Final   Special Requests BOTTLES DRAWN  AEROBIC AND ANAEROBIC 5CC  Final   Culture NO GROWTH 5 DAYS  Final   Report Status 10/14/2014 FINAL  Final  Urine culture     Status: None   Collection Time: 10/09/14  7:41 AM  Result Value Ref Range Status   Specimen Description URINE, CATHETERIZED  Final   Special Requests NONE  Final   Culture MULTIPLE SPECIES PRESENT, SUGGEST RECOLLECTION  Final   Report Status 10/10/2014 FINAL  Final  Blood Culture (routine x 2)     Status: None   Collection Time: 10/09/14  7:57 AM  Result Value Ref Range Status   Specimen Description BLOOD HAND LEFT  Final   Special Requests BOTTLES DRAWN AEROBIC ONLY 5CC  Final   Culture NO GROWTH 5 DAYS  Final   Report Status 10/14/2014 FINAL  Final  Culture, respiratory (NON-Expectorated)     Status: None   Collection Time: 10/09/14  2:00 PM  Result Value Ref Range Status   Specimen Description TRACHEAL ASPIRATE  Final   Special Requests Normal  Final   Gram Stain   Final    RARE WBC PRESENT, PREDOMINANTLY MONONUCLEAR RARE SQUAMOUS EPITHELIAL CELLS PRESENT NO ORGANISMS SEEN Performed at [facility]    Culture   Final    MODERATE ENTEROBACTER AEROGENES Performed at [facility]    Report Status 10/11/2014 FINAL  Final   Organism ID, Bacteria ENTEROBACTER AEROGENES  Final      Susceptibility   Enterobacter aerogenes - MIC*    CEFAZOLIN >=64 RESISTANT Resistant     CEFEPIME <=1 SENSITIVE Sensitive     CEFTAZIDIME >=64 RESISTANT Resistant     CEFTRIAXONE >=64 RESISTANT Resistant     CIPROFLOXACIN <=0.25 SENSITIVE Sensitive     GENTAMICIN <=1 SENSITIVE Sensitive     IMIPENEM <=0.25 SENSITIVE Sensitive     PIP/TAZO >=128 RESISTANT Resistant     TOBRAMYCIN <=1 SENSITIVE Sensitive     TRIMETH/SULFA <=20 SENSITIVE Sensitive     * MODERATE ENTEROBACTER AEROGENES  MRSA PCR Screening     Status: None   Collection Time: 10/09/14  4:04 PM  Result Value Ref Range Status   MRSA by PCR NEGATIVE NEGATIVE Final    Comment:        The  GeneXpert MRSA Assay (FDA approved for NASAL specimens only), is one component of a comprehensive MRSA colonization surveillance program. It is not intended to diagnose MRSA infection nor to guide or monitor treatment for MRSA infections.   Culture, Urine     Status: None   Collection Time: 10/11/14 11:40 AM  Result Value Ref Range Status   Specimen Description URINE, RANDOM  Final   Special Requests NONE  Final   Culture NO GROWTH 1 DAY  Final   Report Status 10/12/2014 FINAL  Final  Culture, blood (routine x 2)     Status: None (Preliminary result)  Collection Time: 10/13/14  6:50 PM  Result Value Ref Range Status   Specimen Description BLOOD LEFT HAND  Final   Special Requests IN PEDIATRIC BOTTLE 1CC  Final   Culture NO GROWTH 4 DAYS  Final   Report Status PENDING  Incomplete  Culture, blood (routine x 2)     Status: None (Preliminary result)   Collection Time: 10/13/14  7:15 PM  Result Value Ref Range Status   Specimen Description BLOOD LEFT HAND  Final   Special Requests IN PEDIATRIC BOTTLE 2CC  Final   Culture NO GROWTH 4 DAYS  Final   Report Status PENDING  Incomplete  Culture, Urine     Status: None   Collection Time: 10/14/14 12:20 AM  Result Value Ref Range Status   Specimen Description URINE, CATHETERIZED  Final   Special Requests NONE  Final   Culture >=100,000 COLONIES/mL YEAST  Final   Report Status 10/15/2014 FINAL  Final  Culture, respiratory (NON-Expectorated)     Status: None   Collection Time: 10/14/14  1:00 AM  Result Value Ref Range Status   Specimen Description TRACHEAL ASPIRATE  Final   Special Requests NONE  Final   Gram Stain   Final    NO WBC SEEN MODERATE SQUAMOUS EPITHELIAL CELLS PRESENT NO ORGANISMS SEEN Performed at [facility]    Culture   Final    Non-Pathogenic Oropharyngeal-type Flora Isolated. Performed at [facility]    Report Status 10/16/2014 FINAL  Final  Culture, respiratory (NON-Expectorated)     Status:  None   Collection Time: 10/16/14  8:59 AM  Result Value Ref Range Status   Specimen Description TRACHEAL ASPIRATE  Final   Special Requests Normal  Final   Gram Stain   Final    RARE WBC PRESENT, PREDOMINANTLY PMN FEW SQUAMOUS EPITHELIAL CELLS PRESENT FEW GRAM VARIABLE ROD RARE GRAM POSITIVE COCCI IN PAIRS    Culture   Final    Non-Pathogenic Oropharyngeal-type Flora Isolated. Performed at [facility]    Report Status 10/18/2014 FINAL  Final     Studies:  Recent x-ray studies have been reviewed in detail by the Attending Physician  Scheduled Meds:  Scheduled Meds: . sodium chloride   Intravenous Once  . arformoterol  15 mcg Nebulization BID  . budesonide (PULMICORT) nebulizer solution  0.5 mg Nebulization BID  . ciprofloxacin  400 mg Intravenous Q12H  . cloBAZam  6.25 mg Per Tube 3 times per day  . diphenhydrAMINE  25 mg Intravenous Daily  . diphenhydrAMINE  25 mg Intravenous Once  . feeding supplement (PRO-STAT SUGAR FREE 64)  30 mL Per Tube BID  . fluconazole (DIFLUCAN) IV  200 mg Intravenous Q24H  . folic acid  1 mg Intravenous Daily  . free water  180 mL Per Tube Q4H  . furosemide  20 mg Intravenous Once  . gabapentin  900 mg Per Tube 3 times per day  . guaiFENesin  30 mL Per Tube BID AC  . insulin aspart  0-9 Units Subcutaneous 6 times per day  . levalbuterol  0.63 mg Nebulization Q6H  . metoCLOPramide  10 mg Per Tube TID AC & HS  . multivitamin  10 mL Per Tube Daily  . omeprazole  20 mg Oral BID  . potassium chloride  40 mEq Per Tube BID  . Pseudoephedrine HCl  30 mg Per J Tube 3 times per day  . sodium chloride  3 mL Intravenous Q12H  . sucralfate  1  g Per Tube TID BM  . Tbo-filgastrim (GRANIX) SQ  480 mcg Subcutaneous ONCE-1800  . thiamine  100 mg Intravenous Daily  . TWOCAL HN  545 mL Per Tube Q24H    Time spent on care of this patient: 40 mins   Birdie Hopes , MD  [facility] Office  [phone] Pager -  [phone]  On-Call/Text Page:      [email or web address]      password [email or web address]  If 7PM-7AM, please contact night-coverage [email or web address] Password [email or web address] 10/18/2014, 11:12 AM   LOS: 9 days   Care during the described time interval was provided by me .  I have reviewed this patient's available data, including medical history, events of note, physical examination, and all test results as part of my evaluation. I have personally reviewed and interpreted all radiology studies.   Birdie Hopes, MD Pager [phone]

## 2014-10-18 NOTE — Progress Notes (Signed)
Patient out of bed placed in personal wheel chair without problems, nasal cannula @ 2 liters, condom cath replaced, dad remains with patient in room.

## 2014-10-18 NOTE — Progress Notes (Signed)
CM spoke with pt's mom /dad in regard to home health needs. Mom stated would like to use  to use a different [place] agency for RN services( blood draws, re-certification, [place]). Choice list given to dad. [facility] was selected by mom for [facility] CM called and spoke with with Farrah([facility]) @ [phone]  for [facility], referral made. CM informed dad [facility] to make visit/ reach to pt within 48 hr period once discharged from hospital. CM spoke also with MD regarding request from  mom if he would call pt's PCP ([facility] ) @ [phone]  to inform PCP of  pt's hospital visit. MD stated he would call pt's PCP prior to d/c. CM to f/u with d/c disposition. Whitman Hero RN, [place] [phone]

## 2014-10-19 ENCOUNTER — Inpatient Hospital Stay (HOSPITAL_COMMUNITY): Payer: Medicaid Other

## 2014-10-19 LAB — GLUCOSE, CAPILLARY
GLUCOSE-CAPILLARY: 79 mg/dL (ref 65–99)
GLUCOSE-CAPILLARY: 78 mg/dL (ref 65–99)
GLUCOSE-CAPILLARY: 85 mg/dL (ref 65–99)
GLUCOSE-CAPILLARY: 87 mg/dL (ref 65–99)
Glucose-Capillary: 101 mg/dL — ABNORMAL HIGH (ref 65–99)
Glucose-Capillary: 94 mg/dL (ref 65–99)

## 2014-10-19 LAB — COMPREHENSIVE METABOLIC PANEL
ALT: 53 U/L (ref 17–63)
ANION GAP: 13 (ref 5–15)
AST: 53 U/L — AB (ref 15–41)
Albumin: 2.8 g/dL — ABNORMAL LOW (ref 3.5–5.0)
Alkaline Phosphatase: 180 U/L — ABNORMAL HIGH (ref 38–126)
BILIRUBIN TOTAL: 1.1 mg/dL (ref 0.3–1.2)
BUN: 19 mg/dL (ref 6–20)
CHLORIDE: 93 mmol/L — AB (ref 101–111)
CO2: 32 mmol/L (ref 22–32)
Calcium: 9 mg/dL (ref 8.9–10.3)
Creatinine, Ser: 0.77 mg/dL (ref 0.61–1.24)
GFR calc Af Amer: >60 mL/min (ref >60)
Glucose, Bld: 90 mg/dL (ref 65–99)
POTASSIUM: 4.2 mmol/L (ref 3.5–5.1)
Sodium: 138 mmol/L (ref 135–145)
Total Protein: 5.6 g/dL — ABNORMAL LOW (ref 6.5–8.1)

## 2014-10-19 LAB — MAGNESIUM: Magnesium: 1.6 mg/dL — ABNORMAL LOW (ref 1.7–2.4)

## 2014-10-19 LAB — URINALYSIS, ROUTINE W REFLEX MICROSCOPIC
Bilirubin Urine: NEGATIVE
GLUCOSE, UA: NEGATIVE mg/dL
Hgb urine dipstick: NEGATIVE
KETONES UR: NEGATIVE mg/dL
LEUKOCYTES UA: NEGATIVE
NITRITE: NEGATIVE
PROTEIN: 100 mg/dL — AB
Specific Gravity, Urine: 1.012 (ref 1.005–1.030)
UROBILINOGEN UA: 1 mg/dL (ref 0.0–1.0)
pH: 7 (ref 5.0–8.0)

## 2014-10-19 LAB — CBC WITH DIFFERENTIAL/PLATELET
BASOS PCT: 0 %
Basophils Absolute: 0 10*3/uL (ref 0.0–0.1)
EOS PCT: 1 %
Eosinophils Absolute: 0.1 10*3/uL (ref 0.0–0.7)
HEMATOCRIT: 34.3 % — AB (ref 39.0–52.0)
HEMOGLOBIN: 10.8 g/dL — AB (ref 13.0–17.0)
LYMPHS ABS: 0.6 10*3/uL — AB (ref 0.7–4.0)
LYMPHS PCT: 4 %
MCH: 31.8 pg (ref 26.0–34.0)
MCHC: 31.5 g/dL (ref 30.0–36.0)
MCV: 100.9 fL — AB (ref 78.0–100.0)
MONOS PCT: 11 %
Monocytes Absolute: 1.6 10*3/uL — ABNORMAL HIGH (ref 0.1–1.0)
NEUTROS ABS: 12.1 10*3/uL — AB (ref 1.7–7.7)
Neutrophils Relative %: 84 %
Platelets: 104 10*3/uL — ABNORMAL LOW (ref 150–400)
RBC: 3.4 MIL/uL — ABNORMAL LOW (ref 4.22–5.81)
RDW: 21.1 % — AB (ref 11.5–15.5)
WBC: 14.4 10*3/uL — ABNORMAL HIGH (ref 4.0–10.5)

## 2014-10-19 LAB — BASIC METABOLIC PANEL
Anion gap: 12 (ref 5–15)
BUN: 18 mg/dL (ref 6–20)
CHLORIDE: 98 mmol/L — AB (ref 101–111)
CO2: 30 mmol/L (ref 22–32)
Calcium: 8.8 mg/dL — ABNORMAL LOW (ref 8.9–10.3)
Creatinine, Ser: 0.8 mg/dL (ref 0.61–1.24)
GFR calc Af Amer: >60 mL/min (ref >60)
GFR calc non Af Amer: >60 mL/min (ref >60)
GLUCOSE: 74 mg/dL (ref 65–99)
POTASSIUM: 4.5 mmol/L (ref 3.5–5.1)
SODIUM: 140 mmol/L (ref 135–145)

## 2014-10-19 LAB — TYPE AND SCREEN
ABO/RH(D): O POS
ANTIBODY SCREEN: NEGATIVE
UNIT DIVISION: 0
Unit division: 0

## 2014-10-19 LAB — URINE MICROSCOPIC-ADD ON

## 2014-10-19 MED ORDER — FUROSEMIDE 10 MG/ML IJ SOLN
40.0000 mg | Freq: Once | INTRAMUSCULAR | Status: AC
  Administered 2014-10-19: 40 mg via INTRAVENOUS
  Filled 2014-10-19: qty 4

## 2014-10-19 NOTE — Progress Notes (Signed)
Patients father concerned about increased WBC requesting we contact patients oncologist.  Contacted on call oncologist Dr. Jana Hakim updated MD on patients labs as well as the fact the patient in s/p two units of PRBC's and a neupogen shot that were given yesterday.  Patient is afebrile and asymptomatic.  Received orders for chest xray, CMET, Urinalysis with culture.  Will continue to follow up.

## 2014-10-19 NOTE — Progress Notes (Signed)
UR COMPLETED  

## 2014-10-19 NOTE — Progress Notes (Signed)
Spoke with pt's father about administering CPT at scheduled time. Father states to hold off on CPT for the rest of the admission, but continue neb treatments. Father states pt is supposed to be discharged in AM. Oncoming therapist notified of father's request. RT will continue to monitor.

## 2014-10-19 NOTE — Progress Notes (Signed)
Progress Note  Jeffery [name] ZRA:[id number] DOB: 1990/08/31 DOA: 10/09/2014 PCP: Marijean Bravo, MD  Admit HPI / Brief Narrative: 24 y.o. WM PMHx cerebral palsy, spastic quadriplegia, wheelchair mobile, seizure disorder, has Port-A-Cath, PEG tube, hx of pancytopenia attributed to Wallace being managed by oncology with transfusions as needed as well as Neulasta, obstructive sleep apnea on BiPAP daily at bedtime, history of recurrent aspiration pneumonia, status post PEG/J-tube placement presented to the ED with worsening shortness of breath over the past week requiring 02, respiratory distress. Approximate 1 week prior to admission patient was diagnosed with a right middle and right lower lobe pneumonia treated in the outpatient setting by Dr. Marin Olp (oncology) with IM Rocephin 2 and subsequently on oral cefdinir with no significant improvement. Patient presented with increased work of breathing, productive cough of thickened secretions, fever with temperature as high as 105 rectally. Patient was noted to be septic with a fever, tachycardic with heart rate in the 140s, and increased respiratory rate as high as 45. Chest x-ray done actually did show some improvement from prior chest x-ray.  HPI/Subjective: Seen with father at bedside, patient is nonverbal. Transfused 2 units of blood yesterday, Neupogen shot was given, counts much better today. Give extra dose of Lasix, likely to be discharged in a.m.  Assessment/Plan:  Acute respiratory distress with Hypoxia -Likely secondary to partially treated HCAP vs Aspiration pneumonia.  -Lactic acid continues to climb despite appropriate antibiotics and fluids.   -Trach aspirate positive Enterobacter resistant to Zosyn but sensitive to Cipro, continue ciprofloxacin. -O2/BiPAP as needed -Continue Physiotherapy with vibrator; avoid his Port-A-Cath right chest wall -Bronchial dilators, mucolytics, antitussives and oxygen as needed.  HCAP +/-  aspiration pneumonia -9/9 CXR; worsening RLL atelectasis/pneumonia  -Tracheal aspirate positive GNR;(ENTEROBACTER AEROGENES), resistant to Zosyn,  -Was on levofloxacin, switched to Cipro, wanted to do cefepime but on national backorder.  Severe Sepsis likely secondary to probable pneumonia/funguria (candidal UTI) -Repeat CXR; worsening atelectasis/pneumonia RLL -Increasing lactic acidosis.  -9/11 Panculture, obtain PCXR -Start, fluconazole IV 400 mg 1; then 200 mg daily. Probably need fluconazole for total of 7-10 days.  Colitis -CT scan of abdomen and pelvis showed possible colitis. -Patient is on ciprofloxacin. Family mentioned possible constipation, and frequent use of enemas. -The thickening of the colon could be secondary to colitis versus frequent use of enemas.  Lactic acidosis -Lactic acid is still elevated. At 5.3 on 9/13. - See acute respiratory distress  Hypothermia/hyperthermia -Bair hugger/cooling blanket  maintain body temp 37C  dysphagia status post PEG/ J tube placement -When not on BiPAP resume tube feeds   seizures/generalized convulsive epilepsy -9/8 Tonic/clonic seizures 2 witnessed. Most likely secondary to patient's continued elevated temperature  -Seizure protocol -Continue Diastat rectal kit (NOTE Versed CAN NOT be used in conjunction with this medication)  -Continue CloBazam (Onfi) 6.25 mg TID.  Cerebral Palsy/spastic quadriplegic -Continue baclofen pump. -Patient receiving Botox injections every 3 months per neurology area outpatient follow-up.  Pancytopenia -Being seen by his oncologist Dr. Marin Olp. -S/P 2 units PRBC last week as outpatient  -Continue Neupogen 480 g daily . -9/9 had transfusion 1 unit of packed RBCs, will transfuse another unit of packed RBC 9/16.  Hypokalemia -WNL   Code Status: DO NOT RESUSCITATE Family Communication: Father present at time of exam Disposition Plan: Resolution sepsis    Consultants: Dr. Marin Olp  (oncology)   Procedure/Significant Events: S/P 2 units PRBC last week as outpatient  -9/9 will transfuse 1 unit PRBC -9/16 Transfusion of 1 unit of  pRBC  Culture 9/7 blood Porta cath/left hand NGTD 9/7 urine positive multiple species  9/7 trach aspirate positive GNR (ENTEROBACTER AEROGENES), resistant to Zosyn 9/7 MRSA by PCR negative 9/7 strep pneumo urine antigen/Legionella urine antigen negative 9/9 urine negative 9/11 blood left hand 2 NGTD 9/12 urine positive yeast> 100,000 colonies 9/13 sputum pending   Antibiotics: Zosyn 9/7>> stopped 9/9 Vancomycin 9/7 >>stopped 9/10 Levofloxacin 9/9>>  Diflucan 9/13>>   DVT prophylaxis: SCD   Devices    LINES / TUBES:      Continuous Infusions: . baclofen      Objective: VITAL SIGNS: Temp: 98.9 F (37.2 C) (09/17 0749) Temp Source: Axillary (09/17 0749) BP: 116/53 mmHg (09/17 0800) Pulse Rate: 106 (09/17 0800) SPO2; FIO2:   Intake/Output Summary (Last 24 hours) at 10/19/14 1136 Last data filed at 10/19/14 0300  Gross per 24 hour  Intake   1170 ml  Output   1575 ml  Net   -405 ml     Exam: General: Lethargic but arousable, appears somewhat uncomfortable in bed, opens eyes to stimulation, does not follow commands negative acute respiratory distress, positive chronic respiratory distress Eyes: Pupils equal round reactive to light and accommodation, negative scleral hemorrhage ENT: Negative Runny nose, negative gingival bleeding, Neck:  Negative scars, masses, torticollis, lymphadenopathy, JVD Lungs: diffuse rhonchi Rt>>Lt, without wheezes or crackles, somewhat improved Cardiovascular: Tachycardic Regular rate without murmur gallop or rub normal S1 and S2 Abdomen:negative abdominal pain, nondistended, positive soft, bowel sounds, no rebound, no ascites, no appreciable mass, PEG tube in place negative sign of infection Extremities: bilateral lower extremity deformed extremities secondary to cerebral palsy;  bilateral distal and hips secondary to cerebral palsy Psychiatric:  Unable to assess  Neurologic:  Withdraws to pain unable to complete assessment    Data Reviewed: Basic Metabolic Panel:  Recent Labs Lab 10/15/14 0424 10/16/14 0500 10/17/14 0640 10/18/14 0450 10/19/14 0330  NA 145 142 141 144 140  K 4.7 3.8 4.1 4.5 4.5  CL 111 106 103 105 98*  CO2 _0 GLUCOSE 91 89 102* 92 74  BUN _1 CREATININE 0.96 0.86 0.72 0.69 0.80  CALCIUM 9.1 8.9 8.7* 8.8* 8.8*  MG 2.1 1.9 1.6* 1.6* 1.6*   Liver Function Tests:  Recent Labs Lab 10/13/14 0345 10/14/14 0500 10/15/14 0424 10/16/14 0500  AST 32 _2 ALT 55 33 30 23  ALKPHOS 114 89 125 111  BILITOT 0.6 0.8 1.0 1.1  PROT 4.1* 4.5* 5.0* 5.0*  ALBUMIN 1.6* 2.6* 2.7* 2.8*    Recent Labs Lab 10/15/14 1625  LIPASE 20*  AMYLASE 42   No results for input(s): AMMONIA in the last 168 hours. CBC:  Recent Labs Lab 10/15/14 0424 10/16/14 0500 10/17/14 0640 10/18/14 0450 10/18/14 2325 10/19/14 0330  WBC 4.4 3.8* 3.3* 2.5*  --  14.4*  NEUTROABS 2.8 2.1 2.1 1.5*  --  12.1*  HGB 8.4* 7.8* 7.7* 7.6* 10.7* 10.8*  HCT 27.0* 25.5* 25.0* 25.2* 34.7* 34.3*  MCV 105.9* 106.7* 106.4* 108.2*  --  100.9*  PLT 115* 109* 110* 104*  --  104*   Cardiac Enzymes: No results for input(s): CKTOTAL, CKMB, CKMBINDEX, TROPONINI in the last 168 hours. BNP (last 3 results) No results for input(s): BNP in the last 8760 hours.  ProBNP (last 3 results) No results for input(s): PROBNP in the last 8760 hours.  CBG:  Recent Labs Lab 10/18/14 1703 10/18/14 1935 10/18/14 2312 10/19/14 4142  10/19/14 0746  GLUCAP 110* 100* 101* 79 78    Recent Results (from the past 240 hour(s))  Culture, respiratory (NON-Expectorated)     Status: None   Collection Time: 10/09/14  2:00 PM  Result Value Ref Range Status   Specimen Description TRACHEAL ASPIRATE  Final   Special Requests Normal  Final   Gram Stain   Final    RARE  WBC PRESENT, PREDOMINANTLY MONONUCLEAR RARE SQUAMOUS EPITHELIAL CELLS PRESENT NO ORGANISMS SEEN Performed at [facility]    Culture   Final    MODERATE ENTEROBACTER AEROGENES Performed at [facility]    Report Status 10/11/2014 FINAL  Final   Organism ID, Bacteria ENTEROBACTER AEROGENES  Final      Susceptibility   Enterobacter aerogenes - MIC*    CEFAZOLIN >=64 RESISTANT Resistant     CEFEPIME <=1 SENSITIVE Sensitive     CEFTAZIDIME >=64 RESISTANT Resistant     CEFTRIAXONE >=64 RESISTANT Resistant     CIPROFLOXACIN <=0.25 SENSITIVE Sensitive     GENTAMICIN <=1 SENSITIVE Sensitive     IMIPENEM <=0.25 SENSITIVE Sensitive     PIP/TAZO >=128 RESISTANT Resistant     TOBRAMYCIN <=1 SENSITIVE Sensitive     TRIMETH/SULFA <=20 SENSITIVE Sensitive     * MODERATE ENTEROBACTER AEROGENES  MRSA PCR Screening     Status: None   Collection Time: 10/09/14  4:04 PM  Result Value Ref Range Status   MRSA by PCR NEGATIVE NEGATIVE Final    Comment:        The GeneXpert MRSA Assay (FDA approved for NASAL specimens only), is one component of a comprehensive MRSA colonization surveillance program. It is not intended to diagnose MRSA infection nor to guide or monitor treatment for MRSA infections.   Culture, Urine     Status: None   Collection Time: 10/11/14 11:40 AM  Result Value Ref Range Status   Specimen Description URINE, RANDOM  Final   Special Requests NONE  Final   Culture NO GROWTH 1 DAY  Final   Report Status 10/12/2014 FINAL  Final  Culture, blood (routine x 2)     Status: None   Collection Time: 10/13/14  6:50 PM  Result Value Ref Range Status   Specimen Description BLOOD LEFT HAND  Final   Special Requests IN PEDIATRIC BOTTLE 1CC  Final   Culture NO GROWTH 5 DAYS  Final   Report Status 10/18/2014 FINAL  Final  Culture, blood (routine x 2)     Status: None   Collection Time: 10/13/14  7:15 PM  Result Value Ref Range Status   Specimen Description BLOOD LEFT  HAND  Final   Special Requests IN PEDIATRIC BOTTLE 2CC  Final   Culture NO GROWTH 5 DAYS  Final   Report Status 10/18/2014 FINAL  Final  Culture, Urine     Status: None   Collection Time: 10/14/14 12:20 AM  Result Value Ref Range Status   Specimen Description URINE, CATHETERIZED  Final   Special Requests NONE  Final   Culture >=100,000 COLONIES/mL YEAST  Final   Report Status 10/15/2014 FINAL  Final  Culture, respiratory (NON-Expectorated)     Status: None   Collection Time: 10/14/14  1:00 AM  Result Value Ref Range Status   Specimen Description TRACHEAL ASPIRATE  Final   Special Requests NONE  Final   Gram Stain   Final    NO WBC SEEN MODERATE SQUAMOUS EPITHELIAL CELLS PRESENT NO ORGANISMS SEEN Performed at [facility]  Culture   Final    Non-Pathogenic Oropharyngeal-type Flora Isolated. Performed at [facility]    Report Status 10/16/2014 FINAL  Final  Culture, respiratory (NON-Expectorated)     Status: None   Collection Time: 10/16/14  8:59 AM  Result Value Ref Range Status   Specimen Description TRACHEAL ASPIRATE  Final   Special Requests Normal  Final   Gram Stain   Final    RARE WBC PRESENT, PREDOMINANTLY PMN FEW SQUAMOUS EPITHELIAL CELLS PRESENT FEW GRAM VARIABLE ROD RARE GRAM POSITIVE COCCI IN PAIRS    Culture   Final    Non-Pathogenic Oropharyngeal-type Flora Isolated. Performed at [facility]    Report Status 10/18/2014 FINAL  Final  Culture, blood (routine x 2)     Status: None (Preliminary result)   Collection Time: 10/16/14  5:21 PM  Result Value Ref Range Status   Specimen Description BLOOD RIGHT PORTA CATH  Final   Special Requests BOTTLES DRAWN AEROBIC AND ANAEROBIC 5ML  Final   Culture NO GROWTH 1 DAY  Final   Report Status PENDING  Incomplete     Studies:  Recent x-ray studies have been reviewed in detail by the Attending Physician  Scheduled Meds:  Scheduled Meds: . arformoterol  15 mcg Nebulization BID  .  budesonide (PULMICORT) nebulizer solution  0.5 mg Nebulization BID  . ciprofloxacin  400 mg Intravenous Q12H  . cloBAZam  6.25 mg Per Tube 3 times per day  . diphenhydrAMINE  25 mg Intravenous Daily  . diphenhydrAMINE  25 mg Intravenous Once  . feeding supplement (PRO-STAT SUGAR FREE 64)  30 mL Per Tube BID  . fluconazole (DIFLUCAN) IV  200 mg Intravenous Q24H  . folic acid  1 mg Intravenous Daily  . free water  180 mL Per Tube Q4H  . gabapentin  900 mg Per Tube 3 times per day  . guaiFENesin  30 mL Per Tube BID AC  . insulin aspart  0-9 Units Subcutaneous 6 times per day  . levalbuterol  0.63 mg Nebulization Q6H  . metoCLOPramide  10 mg Per Tube TID AC & HS  . multivitamin  10 mL Per Tube Daily  . omeprazole  20 mg Oral BID  . potassium chloride  40 mEq Per Tube BID  . Pseudoephedrine HCl  30 mg Per J Tube 3 times per day  . sodium chloride  3 mL Intravenous Q12H  . sucralfate  1 g Per Tube TID BM  . thiamine  100 mg Intravenous Daily  . TWOCAL HN  545 mL Per Tube Q24H    Time spent on care of this patient: 40 mins   Birdie Hopes , MD  [facility] Office  [phone] Pager - [phone]  On-Call/Text Page:      [email or web address]      password [email or web address]  If 7PM-7AM, please contact night-coverage [email or web address] Password [email or web address] 10/19/2014, 11:36 AM   LOS: 10 days   Care during the described time interval was provided by me .  I have reviewed this patient's available data, including medical history, events of note, physical examination, and all test results as part of my evaluation. I have personally reviewed and interpreted all radiology studies.   Birdie Hopes, MD Pager [phone]

## 2014-10-20 LAB — CBC WITH DIFFERENTIAL/PLATELET
BASOS PCT: 0 %
Basophils Absolute: 0 10*3/uL (ref 0.0–0.1)
EOS PCT: 2 %
Eosinophils Absolute: 0.2 10*3/uL (ref 0.0–0.7)
HEMATOCRIT: 35.2 % — AB (ref 39.0–52.0)
HEMOGLOBIN: 10.7 g/dL — AB (ref 13.0–17.0)
LYMPHS ABS: 0.6 10*3/uL — AB (ref 0.7–4.0)
LYMPHS PCT: 6 %
MCH: 30.9 pg (ref 26.0–34.0)
MCHC: 30.4 g/dL (ref 30.0–36.0)
MCV: 101.7 fL — ABNORMAL HIGH (ref 78.0–100.0)
MONOS PCT: 8 %
Monocytes Absolute: 0.8 10*3/uL (ref 0.1–1.0)
NEUTROS ABS: 8.5 10*3/uL — AB (ref 1.7–7.7)
Neutrophils Relative %: 84 %
Platelets: 105 10*3/uL — ABNORMAL LOW (ref 150–400)
RBC: 3.46 MIL/uL — ABNORMAL LOW (ref 4.22–5.81)
RDW: 19.7 % — ABNORMAL HIGH (ref 11.5–15.5)
WBC: 10.1 10*3/uL (ref 4.0–10.5)

## 2014-10-20 LAB — GLUCOSE, CAPILLARY
GLUCOSE-CAPILLARY: 87 mg/dL (ref 65–99)
GLUCOSE-CAPILLARY: 85 mg/dL (ref 65–99)
Glucose-Capillary: 85 mg/dL (ref 65–99)

## 2014-10-20 LAB — MAGNESIUM: Magnesium: 1.7 mg/dL (ref 1.7–2.4)

## 2014-10-20 MED ORDER — FUROSEMIDE 10 MG/ML IJ SOLN
40.0000 mg | Freq: Once | INTRAMUSCULAR | Status: AC
  Administered 2014-10-20: 40 mg via INTRAVENOUS
  Filled 2014-10-20: qty 4

## 2014-10-20 MED ORDER — FLUCONAZOLE 40 MG/ML PO SUSR: 200.0000 mg | Freq: Every day | ORAL | Status: DC

## 2014-10-20 MED ORDER — ALTEPLASE 2 MG IJ SOLR
2.0000 mg | Freq: Once | INTRAMUSCULAR | Status: DC
  Filled 2014-10-20: qty 2

## 2014-10-20 MED ORDER — HEPARIN SOD (PORK) LOCK FLUSH 100 UNIT/ML IV SOLN
500.0000 [IU] | INTRAVENOUS | Status: AC | PRN
  Administered 2014-10-20: 500 [IU]

## 2014-10-20 MED ORDER — CIPROFLOXACIN 500 MG/5ML (10%) PO SUSR: 500.0000 mg | Freq: Two times a day (BID) | ORAL | Status: DC

## 2014-10-20 NOTE — Progress Notes (Signed)
RT Note: Per father's request RT NTS mod, thick, white secretions. Pt tolerated well, no complications. RT will continue to monitor.

## 2014-10-20 NOTE — Progress Notes (Addendum)
Pt discharged to home with father at 79. Pt was discharged via his person wheelchair. Reviewed discharge instructions with parent and provided discharge handout and prescriptions. Parent verbalized understanding. Provided pt with stored medication ( neupogen ) that was stored in [facility] office in a secured ziplock bag on ice per parent request.

## 2014-10-20 NOTE — Discharge Summary (Signed)
Physician Discharge Summary  Jeffery [name] RFV:[id number] DOB: 02-27-1990 DOA: 10/09/2014  PCP: Marijean Bravo, MD  Admit date: 10/09/2014 Discharge date: 10/20/2014  Time spent: 40 minutes  Recommendations for Outpatient Follow-up:  1. Follow-up with primary care physician within one week. 2. Continue Cipro and Diflucan for 5 more days. 3. I have asked the parents to give oral Lasix every other day for 1 more week. 4. Check CBC and BMP within 1 week.  Discharge Diagnoses:  Principal Problem:   Sepsis due to pneumonia Active Problems:   Dysphagia requring chronic J tube feeds   Generalized convulsive epilepsy   Aspiration pneumonia   Acute on chronic respiratory failure with hypoxia   OSA (obstructive sleep apnea)   Cerebral palsy, quadriplegic   Acquired pancytopenia   Sepsis   Severe sepsis   HCAP (healthcare-associated pneumonia)   Sinus tachycardia   Elevated LFTs   Transaminitis   Aspiration pneumonia due to food (regurgitated)   Cerebral palsy   Convulsion   Functional quadriplegia   Other pancytopenia   Acute respiratory failure with hypoxia   Lactic acidosis   Seizures   Spastic quadriplegia   Hypokalemia   Candidal UTI (urinary tract infection)   Hypothermia   Hyperthermia   Pancytopenia   Discharge Condition: Stable  Diet recommendation: Nothing by mouth, continue to feeding  Filed Weights   10/17/14 0500 10/18/14 0458 10/19/14 0300  Weight: 65.1 kg (143 lb 8.3 oz) 66.1 kg (145 lb 11.6 oz) 64.5 kg (142 lb 3.2 oz)    History of present illness:  Jeffery [name] is a 24 y.o. male, with cerebral palsy, spastic quadriplegia, generalized convulsive epilepsy, pancytopenia, obstructive sleep apnea on BiPAP at night, and recurrent aspiration pneumonia (PEG/J tube in place). His stepfather, [name] gives the history as the patient is nonverbal. He reports that normally Mr. Troia is not on oxygen at home but over the past week he has had difficulty breathing and  using 4-6 L of oxygen. Chest x-ray was done on September 1 which showed pneumonia in the right middle and lower lobes. He was treated by Dr. Marin Olp with Rocephin IM 2 days and then started on Cefdinir. He has not improved. This morning he had increased work of breathing and is coughing up thick secretions. His temperature was noted to be 105 rectally and he was brought to the emergency department.   In the ER he is found to be septic his rectal temperature is 13.9, pulse rate 146, white count is 1 at baseline but today is elevated to 5.1. BUN is 50. Chest x-ray is improved compared to 9/1. Suspect acute aspiration. Will admit to step down.  Hospital Course:    Acute respiratory distress with Hypoxia -Likely secondary to partially treated HCAP vs Aspiration pneumonia.  -Patient treated initially with Zosyn and vancomycin. -His tracheal aspirate grew Enterobacter which is resistant to Zosyn, switched to levofloxacin initially, then Cipro. -O2/BiPAP as needed -Received physiotherapy with vibrator -Bronchial dilators, mucolytics, antitussives and oxygen as needed. -Did very well, on discharge 5 more days of ciprofloxacin.  HCAP +/- aspiration pneumonia -9/9 CXR; worsening RLL atelectasis/pneumonia  -Tracheal aspirate positive GNR;(ENTEROBACTER AEROGENES), resistant to Zosyn,  -Was on levofloxacin, switched to Cipro, I wanted to do cefepime but on national backorder.  Severe Sepsis likely secondary to probable pneumonia/funguria (candidal UTI) -Repeat CXR; worsening atelectasis/pneumonia RLL -Increasing lactic acidosis on admission.  -9/11 Panculture. -Started on IV fluconazole, Diflucan 200 mg for 5 more days on discharge  Colitis -  CT scan of abdomen and pelvis showed possible colitis. -Patient is on ciprofloxacin. Family mentioned possible constipation, and frequent use of enemas. -The thickening of the colon could be secondary to colitis versus frequent use of enemas, anyway was  on Cipro.  Lactic acidosis -Lactic acid is still elevated. At 5.3 on 9/13. - See acute respiratory distress  Hypothermia/hyperthermia -Bair hugger/cooling blanket.  dysphagia status post PEG/ J tube placement -When not on BiPAP resume tube feeds   seizures/generalized convulsive epilepsy -9/8 Tonic/clonic seizures 2 witnessed. Most likely secondary to patient's continued elevated temperature  -Seizure protocol -Continue Diastat rectal kit (NOTE Versed CAN NOT be used in conjunction with this medication)  -Continue CloBazam (Onfi) 6.25 mg TID.  Cerebral Palsy/spastic quadriplegic -Continue baclofen pump. -Patient receiving Botox injections every 3 months per neurology area outpatient follow-up.  Pancytopenia -Being seen by his oncologist Dr. Marin Olp. -S/P 2 units PRBC last week as outpatient, had total of 3 units of packed RBCs transfused while this hospital stay.  -Received Neupogen while he is in the hospital.  Hypokalemia -WNL.   Procedures:  Transfusion of total 3 units of packed RBC.  Replacement of the PEG tube  Consultations:  Hem/Onc Dr. Marin Olp  Discharge Exam: Filed Vitals:   10/20/14 1123  BP:   Pulse:   Temp: 98.9 F (37.2 C)  Resp:    General: Alert and awake, oriented x3, not in any acute distress. HEENT: anicteric sclera, pupils reactive to light and accommodation, EOMI CVS: S1-S2 clear, no murmur rubs or gallops Chest: clear to auscultation bilaterally, no wheezing, rales or rhonchi Abdomen: soft nontender, nondistended, normal bowel sounds, no organomegaly Extremities: no cyanosis, clubbing or edema noted bilaterally Neuro: Cranial nerves II-XII intact, no focal neurological deficits  Discharge Instructions   Discharge Instructions    Diet - low sodium heart healthy    Complete by:  As directed      Increase activity slowly    Complete by:  As directed           Current Discharge Medication List    START taking these  medications   Details  ciprofloxacin (CIPRO) 500 MG/5ML (10%) suspension Take 5 mLs (500 mg total) by mouth 2 (two) times daily. Qty: 100 mL, Refills: 0      CONTINUE these medications which have CHANGED   Details  fluconazole (DIFLUCAN) 40 MG/ML suspension Place 5 mLs (200 mg total) into feeding tube daily. Qty: 35 mL, Refills: 0      CONTINUE these medications which have NOT CHANGED   Details  acetaminophen (TYLENOL) 160 MG/5ML solution 500 mg by Gastric Tube route every 6 (six) hours as needed for moderate pain or fever.     acetaminophen (TYLENOL) 80 MG suppository Place 480 mg rectally every 4 (four) hours as needed for fever.    albuterol (PROVENTIL HFA;VENTOLIN HFA) 108 (90 BASE) MCG/ACT inhaler Inhale 2 puffs into the lungs every 4 (four) hours as needed for shortness of breath.    albuterol (PROVENTIL) (2.5 MG/3ML) 0.083% nebulizer solution Take 2.5 mg by nebulization See admin instructions. Give 1 vial (2.5 mg) twice daily (8am and 8pm) and every 4 hours as needed for shortness of breath or wheezing    baclofen (GABLOFEN) 40000 MCG/20ML SOLN by Intrathecal route continuous. 385.2 mcg in 24 hours    baclofen (LIORESAL) 10 MG tablet CRUSH AND GIVE 1 TABLET 3 TIMES PER DAY Qty: 90 tablet, Refills: 1    bag balm OINT ointment Apply 1 application topically See  admin instructions. Apply to sacral area with each diaper change    calcium carbonate, dosed in mg elemental calcium, 1250 MG/5ML 1,250 mg by Gastric Tube route 3 (three) times daily. 8am , 2pm, and 8pm    cloBAZam (ONFI) 2.5 MG/ML solution Place 2.5 mL in gastrostomy tube 3 times daily Qty: 240 mL, Refills: 5   Associated Diagnoses: Generalized convulsive epilepsy with intractable epilepsy; Localization-related symptomatic epilepsy and epileptic syndromes with complex partial seizures, intractable, without status epilepticus    DIASTAT ACUDIAL 20 MG GEL Place 12.5 mg rectally as needed (for seizure lasting 2 minutes or  longer or repetitive seizures - no more than 1 dose in 12 hours (not given with nasal versed)).  Refills: 5    dicyclomine (BENTYL) 10 MG/5ML syrup 10 mg by Gastric Tube route every 8 (eight) hours as needed (abdominal cramps). Do not exceed 5 days in one week    diphenoxylate-atropine (LOMOTIL) 2.5-0.025 MG/5ML liquid 10 mLs by Gastric Tube route every 6 (six) hours as needed for diarrhea or loose stools.     filgrastim (NEUPOGEN) 480 MCG/1.6ML injection Inject 1.6 mLs (480 mcg total) into the skin once a week. As directed by Dr Hunt Oris: 4 vial, Refills: 6   Associated Diagnoses: Acquired pancytopenia    folic acid (FOLVITE) 1 MG tablet 1 mg by Gastric Tube route daily. 8:00am per G tube    furosemide (LASIX) 10 MG/ML solution 20 mg by Gastric Tube route daily as needed for fluid or edema. Maximum 3 times week    gabapentin (NEURONTIN) 250 MG/5ML solution Take 18 mL 3 times daily Qty: 1620 mL, Refills: 2   Associated Diagnoses: Hip pain, unspecified laterality    ibuprofen (ADVIL,MOTRIN) 100 MG/5ML suspension 400 mg by Gastric Tube route every 6 (six) hours as needed for fever (pain).     ketoconazole (NIZORAL) 2 % cream Apply 1 application topically 2 (two) times daily as needed for irritation (yeast rash from antibiotics).     lidocaine (XYLOCAINE) 5 % ointment APPLY AS DIRECTED 30 minutes PRIOR TO BLOOD DRAWS Refills: 1    metoCLOPramide (REGLAN) 5 MG/5ML solution Place 10 mLs (10 mg total) into feeding tube 4 (four) times daily -  before meals and at bedtime. Qty: 240 mL, Refills: 3   Associated Diagnoses: Gastroesophageal reflux disease without esophagitis    midazolam (VERSED) 5 MG/ML injection Place 2 mLs (10 mg total) into the nose once. Draw up 5m in 2 syringes. Remove blue vial access device. Attach syringe to nasal atomizer for intranasal administration. Give 149min right nostril x 2 for seizures lasting 2 minutes or longer or for repetitive seizures in a short period  of time. Qty: 6 mL, Refills: 3    Multiple Vitamin (MULTIVITAMIN) LIQD 10 mLs by Gastric Tube route daily.    Multiple Vitamins-Minerals (ZINC PO) 5 mLs by Gastric Tube route daily. 244 mg / 5 mL zinc solution compounded by [facility]    mupirocin ointment (BACTROBAN) 2 % Apply 1 application topically as needed (to G-T site). Qty: 22 g, Refills: 1    !! Nutritional Supplements (PROMOD) LIQD Take 30 mLs by mouth 2 (two) times daily. 8am and 5pm    !! Nutritional Supplements (TWOCAL HN) LIQD 237 mLs by Gastric Tube route See admin instructions. T.3 can at 46 cc hour x 12 hours - water 172 cc pre and post each and extra 172 bid.    omeprazole (PRILOSEC) 2 mg/mL SUSP Take 20 mg by mouth  2 (two) times daily.    OnabotulinumtoxinA (BOTOX IJ) Inject as directed See admin instructions. Every 3 months    OXYGEN-HELIUM IN Inhale into the lungs. Oxygen PRN to keep O2 Sat at 90%    potassium chloride (KLOR-CON) 20 MEQ packet Give 20 meq daily except twice daily with lasix, given through J port Qty: 1 packet, Refills: 5    Pseudoephedrine HCl (SUDAFED CHILDRENS) 15 MG/5ML LIQD 30 mg by Gastric Tube route 3 (three) times daily. 8am, 2pm, 8pm    simethicone (MYLICON) 825 MG chewable tablet 125 mg by Gastric Tube route See admin instructions. 163m caplet added to each can of Two Cal 3 times daily    sodium phosphate (FLEET) enema Place 1 enema rectally daily as needed (gas buildup/ bloating).     sucralfate (CARAFATE) 1 GM/10ML suspension 1 g by Gastric Tube route 4 (four) times daily. 7am, 12pm, 5pm, 7pm Administer alone 30 minutes prior to other medications.    Vitamin Mixture (VITAMIN C) LIQD 300 mLs by Gastric Tube route daily.    Water For Irrigation, Sterile (FREE WATER) SOLN 172 mLs by Gastric Tube route 4 (four) times daily. After each can of feeding supplement    Zinc Oxide (BALMEX EX) Apply 1 application topically See admin instructions. Apply to sacral with each diaper change     Amino Acids-Protein Hydrolys (FEEDING SUPPLEMENT, PRO-STAT SUGAR FREE 64,) LIQD Take 30 mLs by mouth 2 (two) times daily. Qty: 900 mL, Refills: 0    Cholecalciferol (VITAMIN D3) 400 UNIT/ML LIQD Give 2.5 mLs by tube daily. Refills: 5    filgrastim (NEUPOGEN) 480 MCG/0.8ML SOSY injection Inject 0.8 mLs (480 mcg total) into the skin once. Qty: 4 Syringe, Refills: 6    oseltamivir (TAMIFLU) 75 MG capsule Take 1 capsule (75 mg total) by mouth daily. Qty: 10 capsule, Refills: 0   Associated Diagnoses: Influenzal bronchitis     !! - Potential duplicate medications found. Please discuss with provider.    STOP taking these medications     cefdinir (OMNICEF) 250 MG/5ML suspension        Allergies  Allergen Reactions  . Depakote [Divalproex Sodium]     Causes pancreatitis   . Vimpat [Lacosamide] Rash  . Keppra [Levetiracetam] Other (See Comments)    Bone marrow suppression  . Adhesive [Tape] Other (See Comments)    Rips skin off (paper tape is ok)  . Amoxicillin-Pot Clavulanate Rash    If given with fluconazole rash is not as severe or does not appear at all  . Neulasta [Pegfilgrastim] Other (See Comments)    Fever, tachycardia   Follow-up Information    Follow up with [name], DAVID C, MD In 1 week.   Specialty:  Internal Medicine   Contact information:   [address] [place] [phone]       The results of significant diagnostics from this hospitalization (including imaging, microbiology, ancillary and laboratory) are listed below for reference.    Significant Diagnostic Studies: Dg Chest 2 View  10/03/2014   CLINICAL DATA:  Productive cough with shortness of breath and tachycardia; fever  EXAM: CHEST  2 VIEW  COMPARISON:  July 25, 2014  FINDINGS: There is airspace consolidation involving portions of the right middle and lower lobes. Left lung is clear. Heart size is normal. Port-A-Cath tip is in the cavoatrial junction region. There is  extensive postoperative change in the thoracic and visualized lumbar spine.  IMPRESSION: Airspace consolidation involving portions of  the right middle and lower lobes. Left lung clear. Cardiac silhouette within normal limits.   Electronically Signed   By: Lowella Grip III M.D.   On: 10/03/2014 12:32   Ct Chest W Contrast  10/14/2014   CLINICAL DATA:  Pneumonia. Follow-up progression of pulmonary infiltrates.  EXAM: CT CHEST WITH CONTRAST  TECHNIQUE: Multidetector CT imaging of the chest was performed during intravenous contrast administration.  CONTRAST:  25m OMNIPAQUE IOHEXOL 300 MG/ML  SOLN  COMPARISON:  Chest x-ray 10/14/2014.  Chest CT 04/20/2013.  FINDINGS: Beam hardening artifact from posterior spinal rods somewhat limits study.  There are moderate to large bilateral pleural effusions. Bilateral lower lobe posterior opacities likely reflect compressive atelectasis although cannot exclude pneumonia. Right middle lobe and lingular atelectasis present.  Heart is normal size. Aorta is normal caliber. Small scattered mediastinal lymph nodes, none pathologically enlarged. No hilar or axillary adenopathy. Right Port-A-Cath in place with the tip at the cavoatrial junction. Chest wall soft tissues otherwise unremarkable. Imaging into the upper abdomen shows no acute findings.  IMPRESSION: Moderate to large bilateral pleural effusions. Posterior bilateral lower lobe opacities could reflect compressive atelectasis or pneumonia.   Electronically Signed   By: KRolm BaptiseM.D.   On: 10/14/2014 14:43   [place]oreaChest  10/15/2014   CLINICAL DATA:  Please perform chest ultrasound and ultrasound-guided thoracentesis if enough pleural fluid is visualized.  EXAM: CHEST ULTRASOUND  COMPARISON:  Chest radiograph - 10/14/2014; chest CT - 10/14/2014  FINDINGS: Multiple grayscale images were obtained of the bilateral chest and demonstrate only a trace amounts of bilateral anechoic pleural fluid, not enough to allow for safe  percutaneous thoracentesis. As such, no procedure performed.  IMPRESSION: Trace bilateral anechoic pleural effusions, too small to allow for safe percutaneous thoracentesis.   Electronically Signed   By: JSandi MariscalM.D.   On: 10/15/2014 17:22   Ct Abdomen Pelvis W Contrast  10/15/2014   CLINICAL DATA:  Patient is septic with fever and tachycardia. Increased respiratory rate.  EXAM: CT ABDOMEN AND PELVIS WITH CONTRAST  TECHNIQUE: Multidetector CT imaging of the abdomen and pelvis was performed using the standard protocol following bolus administration of intravenous contrast.  CONTRAST:  846mOMNIPAQUE IOHEXOL 300 MG/ML  SOLN  COMPARISON:  Chest CT 10/14/2014; CT abdomen pelvis 11/21/2012.  FINDINGS: Lower chest: Normal heart size. Moderate bilateral pleural effusions. Ground-glass and consolidative opacities within bilateral lower lobes as well as right middle lobe.  Hepatobiliary: Liver is normal in size and contour. No focal hepatic lesion is identified. Possible small stones within the gallbladder lumen.  Pancreas: Unremarkable  Spleen: Enlarged measuring up to 14 cm.  Adrenals/Urinary Tract: Kidneys enhance symmetrically with contrast. The adrenal glands are normal. Urinary bladder is decompressed. Multiple nonobstructing stones within the right kidney measuring up to 7 mm.  Stomach/Bowel: Small amount of free fluid in the pelvis. Circumferential wall thickening of rectum. Percutaneous gastrojejunostomy tube appears in appropriate position. No evidence for small bowel obstruction. No free intraperitoneal air. Suggestion of fluid along the lateral aspect of gastrojejunostomy tube near the expected location of the second portion the duodenum (image 33; series 4) is favored to represent fluid within duodenum.  Vascular/Lymphatic: Stable diminutive appearance of the abdominal aorta.  Other: Small amount of fluid within the left inguinal region. Diffuse soft tissue anasarca. Large bowel containing ventral  abdominal wall hernia. Spinal stimulator device within the right lower quadrant anterior abdominal wall soft tissues.  Musculoskeletal: Re- demonstrated bilateral hip dislocation. Thoracolumbar spinal fusion hardware.  IMPRESSION: Bilateral pleural effusions with underlying consolidative opacities which may represent atelectasis or pneumonia.  Wall thickening of the distal colon and rectum which may be secondary to colitis. There is a small amount of free fluid in the pelvis.  Anasarca.  Splenomegaly.  Right-sided nephrolithiasis.  Possible cholelithiasis.   Electronically Signed   By: Lovey Newcomer M.D.   On: 10/15/2014 19:28   Ir Gj Tube Change  10/18/2014   CLINICAL DATA:  24 year old male with a low profile MIC-KEY gastrojejunostomy tube. He undergoes routine exchange approximately every 3 months to prevent tube clogging. He is currently admitted to the hospital and recovering from pneumonia. He comes to Interventional Radiology for exchange today while he is in the hospital.  EXAM: JEJUNAL CATHETER REPLACEMENT  Date: 10/18/2014  PROCEDURE: 1. Gastro jejunostomy exchange under fluoroscopy Interventional Radiologist:  Criselda Peaches, MD  ANESTHESIA/SEDATION: None required  MEDICATIONS: None required  FLUOROSCOPY TIME:  1 minute 48 seconds  76.5 mGy  CONTRAST:  26m OMNIPAQUE IOHEXOL 300 MG/ML  SOLN  TECHNIQUE: Informed consent was obtained from the patient following explanation of the procedure, risks, benefits and alternatives. The patient understands, agrees and consents for the procedure. All questions were addressed. A time out was performed.  Maximal barrier sterile technique utilized including caps, mask, sterile gowns, sterile gloves, large sterile drape, hand hygiene, and Betadine skin prep.  A hand injection of contrast material confirmed the tip of the catheter to be within the small bowel. A stiff glidewire was advanced through the catheter and into the small bowel. The balloon was deflated and  the gastro jejunostomy tube was removed over the wire.  A new low profile gastro jejunostomy tube was then advanced over the wire and positioned in the proximal small bowel. This was confirmed with a hand injection of contrast material. The retention balloon was inflated with 7 mL sterile saline.  COMPLICATIONS: None  IMPRESSION: Successful routine exchange of percutaneous gastrojejunostomy tube.   Electronically Signed   By: HJacqulynn CadetM.D.   On: 10/18/2014 15:23   Dg Chest Port 1 View  10/19/2014   CLINICAL DATA:  Pneumonia, elevated white blood cells  EXAM: PORTABLE CHEST - 1 VIEW  COMPARISON:  None.  FINDINGS: Right Port-A-Cath is unchanged in position. Spinal fixation rods thoracolumbar spine again noted. Small bilateral pleural effusion with bilateral basilar atelectasis or infiltrate left greater than right. No convincing pulmonary edema.  IMPRESSION: Small bilateral pleural effusion with bilateral basilar atelectasis or infiltrate left greater than right. No pulmonary edema.   Electronically Signed   By: LLahoma CrockerM.D.   On: 10/19/2014 18:39   Dg Chest Port 1 View  10/14/2014   CLINICAL DATA:  24year old male with pulmonary edema  EXAM: PORTABLE CHEST - 1 VIEW  COMPARISON:  Prior chest x-ray 10/13/2014  FINDINGS: Right subclavian approach single-lumen power injectable port catheter remains in unchanged position. The tip projects over the caval atrial junction. Incompletely imaged long segment posterior spinal fusion hardware. Inspiratory volumes are slightly improved compared to the recent chest x-ray. There is a layering right-sided pleural effusion with associated right basilar atelectasis. Persistent mild diffuse interstitial opacity suggesting pulmonary edema. No acute osseous abnormality.  IMPRESSION: 1. Improved inspiratory volumes and pulmonary aeration compared to 10/13/2014. 2. Moderate layering right pleural effusion with associated right lower lobe atelectasis. Superimposed  infiltrate is difficult to exclude. 3. Persistent mild diffuse bilateral interstitial opacities likely reflecting interstitial edema.   Electronically Signed   By: HDellis FilbertD.  On: 10/14/2014 07:54   Dg Chest Port 1 View  10/13/2014   CLINICAL DATA:  24 year old male with concern for pneumonia.  EXAM: PORTABLE CHEST - 1 VIEW  COMPARISON:  Chest radiograph dated 10/11/2014  FINDINGS: Right sided central venous catheter with tip in stable positioning. There has been interval increase in the right lung opacity with small amount of aerated lung seen at the right lung apex. There is an area of increased opacity at the left lung base similar or increased in size compared to prior study. Evaluation of the left lung is limited due to overlying support wires. Spinal fixation hardware noted.  IMPRESSION: Interval progression of the right lung opacity. Follow-up recommended.   Electronically Signed   By: Anner Crete M.D.   On: 10/13/2014 19:10   Dg Chest Port 1 View  10/11/2014   CLINICAL DATA:  pneumonia  EXAM: PORTABLE CHEST - 1 VIEW  COMPARISON:  10/10/2014  FINDINGS: Progression of right lower lobe volume loss with atelectasis. Left lower lobe atelectasis/ infiltrate also unchanged. No significant edema or effusion.  Port-A-Cath tip in the SVC.  Scoliosis and spinal fixation rods.  IMPRESSION: Progression of right lower lobe atelectasis. No change in left lower lobe atelectasis/infiltrate.   Electronically Signed   By: Franchot Gallo M.D.   On: 10/11/2014 07:55   Dg Chest Port 1 View  10/10/2014   CLINICAL DATA:  Sepsis  EXAM: PORTABLE CHEST - 1 VIEW  COMPARISON:  10/09/2014  FINDINGS: Cardiac shadow is stable. A right-sided chest wall port is again identified. Multilevel spinal fixation is again seen and stable. The left basilar atelectasis is again noted. Very minimal right basilar atelectasis is now seen along the minor fissure. No focal confluent infiltrate is noted.  IMPRESSION: Mild bibasilar  atelectasis.   Electronically Signed   By: Inez Catalina M.D.   On: 10/10/2014 07:45   Dg Chest Port 1 View  10/09/2014   CLINICAL DATA:  Shortness of breath, cough.  EXAM: PORTABLE CHEST - 1 VIEW  COMPARISON:  October 03, 2014.  FINDINGS: The heart size and mediastinal contours are within normal limits. Right-sided Port-A-Cath is unchanged in position with distal tip overlying expected position of the SVC. Right lung opacities noted on prior exam have resolved. Probable minimal subsegmental atelectasis seen in left midlung. Harrington rods are seen in the thoracic and lumbar spine which are unchanged. Stable elevated left hemidiaphragm is noted. The visualized skeletal structures are unremarkable.  IMPRESSION: Right lung opacities noted on prior exam have resolved. Minimal subsegmental atelectasis is noted in left midlung.   Electronically Signed   By: Marijo Conception, M.D.   On: 10/09/2014 08:07    Microbiology: Recent Results (from the past 240 hour(s))  Culture, Urine     Status: None   Collection Time: 10/11/14 11:40 AM  Result Value Ref Range Status   Specimen Description URINE, RANDOM  Final   Special Requests NONE  Final   Culture NO GROWTH 1 DAY  Final   Report Status 10/12/2014 FINAL  Final  Culture, blood (routine x 2)     Status: None   Collection Time: 10/13/14  6:50 PM  Result Value Ref Range Status   Specimen Description BLOOD LEFT HAND  Final   Special Requests IN PEDIATRIC BOTTLE 1CC  Final   Culture NO GROWTH 5 DAYS  Final   Report Status 10/18/2014 FINAL  Final  Culture, blood (routine x 2)     Status: None   Collection  Time: 10/13/14  7:15 PM  Result Value Ref Range Status   Specimen Description BLOOD LEFT HAND  Final   Special Requests IN PEDIATRIC BOTTLE 2CC  Final   Culture NO GROWTH 5 DAYS  Final   Report Status 10/18/2014 FINAL  Final  Culture, Urine     Status: None   Collection Time: 10/14/14 12:20 AM  Result Value Ref Range Status   Specimen Description  URINE, CATHETERIZED  Final   Special Requests NONE  Final   Culture >=100,000 COLONIES/mL YEAST  Final   Report Status 10/15/2014 FINAL  Final  Culture, respiratory (NON-Expectorated)     Status: None   Collection Time: 10/14/14  1:00 AM  Result Value Ref Range Status   Specimen Description TRACHEAL ASPIRATE  Final   Special Requests NONE  Final   Gram Stain   Final    NO WBC SEEN MODERATE SQUAMOUS EPITHELIAL CELLS PRESENT NO ORGANISMS SEEN Performed at [facility]    Culture   Final    Non-Pathogenic Oropharyngeal-type Flora Isolated. Performed at [facility]    Report Status 10/16/2014 FINAL  Final  Culture, respiratory (NON-Expectorated)     Status: None   Collection Time: 10/16/14  8:59 AM  Result Value Ref Range Status   Specimen Description TRACHEAL ASPIRATE  Final   Special Requests Normal  Final   Gram Stain   Final    RARE WBC PRESENT, PREDOMINANTLY PMN FEW SQUAMOUS EPITHELIAL CELLS PRESENT FEW GRAM VARIABLE ROD RARE GRAM POSITIVE COCCI IN PAIRS    Culture   Final    Non-Pathogenic Oropharyngeal-type Flora Isolated. Performed at [facility]    Report Status 10/18/2014 FINAL  Final  Culture, blood (routine x 2)     Status: None (Preliminary result)   Collection Time: 10/16/14  5:21 PM  Result Value Ref Range Status   Specimen Description BLOOD RIGHT PORTA CATH  Final   Special Requests BOTTLES DRAWN AEROBIC AND ANAEROBIC 5ML  Final   Culture NO GROWTH 2 DAYS  Final   Report Status PENDING  Incomplete     Labs: Basic Metabolic Panel:  Recent Labs Lab 10/16/14 0500 10/17/14 0640 10/18/14 0450 10/19/14 0330 10/19/14 1813 10/20/14 0518  NA 142 141 144 140 138  --   K 3.8 4.1 4.5 4.5 4.2  --   CL 106 103 105 98* 93*  --   CO2 _0 32  --   GLUCOSE 89 102* 92 74 90  --   BUN _1 --   CREATININE 0.86 0.72 0.69 0.80 0.77  --   CALCIUM 8.9 8.7* 8.8* 8.8* 9.0  --   MG 1.9 1.6* 1.6* 1.6*  --  1.7   Liver  Function Tests:  Recent Labs Lab 10/14/14 0500 10/15/14 0424 10/16/14 0500 10/19/14 1813  AST _2 53*  ALT 33 30 23 53  ALKPHOS 89 125 111 180*  BILITOT 0.8 1.0 1.1 1.1  PROT 4.5* 5.0* 5.0* 5.6*  ALBUMIN 2.6* 2.7* 2.8* 2.8*    Recent Labs Lab 10/15/14 1625  LIPASE 20*  AMYLASE 42   No results for input(s): AMMONIA in the last 168 hours. CBC:  Recent Labs Lab 10/16/14 0500 10/17/14 0640 10/18/14 0450 10/18/14 2325 10/19/14 0330 10/20/14 0518  WBC 3.8* 3.3* 2.5*  --  14.4* 10.1  NEUTROABS 2.1 2.1 1.5*  --  12.1* 8.5*  HGB 7.8* 7.7* 7.6* 10.7* 10.8* 10.7*  HCT 25.5* 25.0*  25.2* 34.7* 34.3* 35.2*  MCV 106.7* 106.4* 108.2*  --  100.9* 101.7*  PLT 109* 110* 104*  --  104* 105*   Cardiac Enzymes: No results for input(s): CKTOTAL, CKMB, CKMBINDEX, TROPONINI in the last 168 hours. BNP: BNP (last 3 results) No results for input(s): BNP in the last 8760 hours.  ProBNP (last 3 results) No results for input(s): PROBNP in the last 8760 hours.  CBG:  Recent Labs Lab 10/19/14 1557 10/19/14 2142 10/19/14 2358 10/20/14 0322 10/20/14 0724  GLUCAP 85 87 85 87 85       Signed:  ELMAHI,MUTAZ A  [facility] 10/20/2014, 11:42 AM

## 2014-10-21 LAB — URINE CULTURE: CULTURE: NO GROWTH

## 2014-10-22 ENCOUNTER — Telehealth: Payer: Self-pay | Admitting: *Deleted

## 2014-10-22 ENCOUNTER — Telehealth: Payer: Self-pay | Admitting: Family

## 2014-10-22 DIAGNOSIS — Z95828 Presence of other vascular implants and grafts: Secondary | ICD-10-CM

## 2014-10-22 DIAGNOSIS — D61818 Other pancytopenia: Secondary | ICD-10-CM

## 2014-10-22 DIAGNOSIS — M25559 Pain in unspecified hip: Secondary | ICD-10-CM

## 2014-10-22 LAB — CULTURE, BLOOD (ROUTINE X 2): Culture: NO GROWTH

## 2014-10-22 MED ORDER — SODIUM CHLORIDE 0.9 % IJ SOLN: INTRAMUSCULAR | Status: DC

## 2014-10-22 MED ORDER — HEPARIN LOCK FLUSH 100 UNIT/ML IV SOLN: 500.0000 [IU] | INTRAVENOUS | Status: DC | PRN

## 2014-10-22 NOTE — Telephone Encounter (Signed)
Called RN with [facility] to give orders for CBC and CMP to be drawn today. RN informed office that she is going to patient home today to admit patient, but she is unable to draw labs because the patient has not yet been prescribed heparin and saline flushes. This office will send in new prescriptions to [facility] per [facility]'s request. RN will draw labs tomorrow and fax results to the office.

## 2014-10-22 NOTE — Telephone Encounter (Signed)
Called Jeffery [name] to notify her of new prescriptions being sent to [facility]. She is aware that they can pick up or deliver.  She also stated during call that patient is too ill to travel at this time, so she would like appointments on Thursday to be cancelled. Appointments cancelled.

## 2014-10-22 NOTE — Telephone Encounter (Signed)
[name] left a message saying that she needed Normal Saline and Heparin orders sent to [facility] for [place]. I called Mom and she said that [name] has been in the hospital with pneumonia and a fungal infection for the last 2 weeks. At discharge, his home care provider changed to [place] because of problems with [facility]. Mom said that Dr Hansel Starling office sent orders to pharmacy today for [place] for accessing port, but they will keep those supplies. When [facility] stopped care, the supplies that Mom had that were unused were returned to [facility]. She needs her own supply of Normal Saline and Heparin sent to [facility] in the event that she would need to access his portacath in an emergency situation for EMS. I told Mom that I would send in orders as requested. TG

## 2014-10-22 NOTE — Telephone Encounter (Signed)
Thank you :)

## 2014-10-23 ENCOUNTER — Telehealth: Payer: Self-pay | Admitting: *Deleted

## 2014-10-23 NOTE — Telephone Encounter (Signed)
Order for weekly CBC and CMP called to [place] with [place]. Labs to be drawn once a week in the am with results faxed to office. Marcene Brawn RN received orders and will start lab draws near the end of next week.

## 2014-10-23 NOTE — Telephone Encounter (Signed)
Jeffery [name] called to schedule follow up appointment. Appointment made. Due to [name] not feeling well, she may cancel Monday am if she feels like travel may be too much for him.

## 2014-10-23 NOTE — Telephone Encounter (Signed)
[name] received lab work from [facility]. [name] would like patient to receive a dose of Neupogen today. [name] called and she is aware to give patient dose.

## 2014-10-24 ENCOUNTER — Ambulatory Visit: Payer: Medicaid Other | Admitting: Family

## 2014-10-24 ENCOUNTER — Other Ambulatory Visit: Payer: Medicaid Other

## 2014-10-24 ENCOUNTER — Ambulatory Visit: Payer: Medicaid Other

## 2014-10-24 ENCOUNTER — Other Ambulatory Visit: Payer: Self-pay | Admitting: Hematology & Oncology

## 2014-10-24 DIAGNOSIS — J690 Pneumonitis due to inhalation of food and vomit: Secondary | ICD-10-CM

## 2014-10-24 MED ORDER — METRONIDAZOLE 50 MG/ML ORAL SUSPENSION: 500.0000 mg | Freq: Three times a day (TID) | ORAL | Status: DC

## 2014-10-24 MED ORDER — AMOXICILLIN-POT CLAVULANATE 600-42.9 MG/5ML PO SUSR
600.0000 mg | Freq: Two times a day (BID) | ORAL | Status: DC
Start: 2014-10-24 — End: 2015-01-01

## 2014-10-25 ENCOUNTER — Other Ambulatory Visit: Payer: Self-pay

## 2014-10-25 DIAGNOSIS — Z95828 Presence of other vascular implants and grafts: Secondary | ICD-10-CM

## 2014-10-25 MED ORDER — SODIUM CHLORIDE 0.9 % IJ SOLN: INTRAMUSCULAR | Status: DC

## 2014-10-25 MED ORDER — SODIUM CHLORIDE 0.9 % IJ SOLN: INTRAMUSCULAR | Status: AC

## 2014-10-25 MED ORDER — HEPARIN LOCK FLUSH 100 UNIT/ML IV SOLN: 500.0000 [IU] | INTRAVENOUS | Status: AC | PRN

## 2014-10-25 MED ORDER — HEPARIN LOCK FLUSH 100 UNIT/ML IV SOLN: 500.0000 [IU] | INTRAVENOUS | Status: DC | PRN

## 2014-10-28 ENCOUNTER — Ambulatory Visit (HOSPITAL_BASED_OUTPATIENT_CLINIC_OR_DEPARTMENT_OTHER): Payer: Medicaid Other

## 2014-10-28 ENCOUNTER — Ambulatory Visit (HOSPITAL_BASED_OUTPATIENT_CLINIC_OR_DEPARTMENT_OTHER): Payer: Medicaid Other | Admitting: Family

## 2014-10-28 ENCOUNTER — Other Ambulatory Visit (HOSPITAL_BASED_OUTPATIENT_CLINIC_OR_DEPARTMENT_OTHER): Payer: Medicaid Other

## 2014-10-28 ENCOUNTER — Encounter: Payer: Self-pay | Admitting: Family

## 2014-10-28 VITALS — BP 113/80 | HR 128 | Temp 98.6°F | Resp 20

## 2014-10-28 DIAGNOSIS — D61818 Other pancytopenia: Secondary | ICD-10-CM

## 2014-10-28 DIAGNOSIS — E860 Dehydration: Secondary | ICD-10-CM

## 2014-10-28 DIAGNOSIS — D638 Anemia in other chronic diseases classified elsewhere: Secondary | ICD-10-CM

## 2014-10-28 DIAGNOSIS — D61811 Other drug-induced pancytopenia: Secondary | ICD-10-CM

## 2014-10-28 DIAGNOSIS — R062 Wheezing: Secondary | ICD-10-CM

## 2014-10-28 LAB — CBC WITH DIFFERENTIAL (CANCER CENTER ONLY)
BASO#: 0 10*3/uL (ref 0.0–0.2)
BASO%: 0.3 % (ref 0.0–2.0)
EOS ABS: 0.1 10*3/uL (ref 0.0–0.5)
EOS%: 1.1 % (ref 0.0–7.0)
HEMATOCRIT: 38.3 % — AB (ref 38.7–49.9)
HEMOGLOBIN: 11.6 g/dL — AB (ref 13.0–17.1)
LYMPH#: 0.5 10*3/uL — AB (ref 0.9–3.3)
LYMPH%: 7.5 % — ABNORMAL LOW (ref 14.0–48.0)
MCH: 32 pg (ref 28.0–33.4)
MCHC: 30.3 g/dL — AB (ref 32.0–35.9)
MCV: 106 fL — ABNORMAL HIGH (ref 82–98)
MONO#: 0.8 10*3/uL (ref 0.1–0.9)
MONO%: 10.6 % (ref 0.0–13.0)
NEUT%: 80.5 % — ABNORMAL HIGH (ref 40.0–80.0)
NEUTROS ABS: 5.7 10*3/uL (ref 1.5–6.5)
Platelets: 131 10*3/uL — ABNORMAL LOW (ref 145–400)
RBC: 3.63 10*6/uL — ABNORMAL LOW (ref 4.20–5.70)
RDW: 18.7 % — ABNORMAL HIGH (ref 11.1–15.7)
WBC: 7.1 10*3/uL (ref 4.0–10.0)

## 2014-10-28 LAB — CMP (CANCER CENTER ONLY)
ALBUMIN: 3.4 g/dL (ref 3.3–5.5)
ALT(SGPT): 192 U/L — ABNORMAL HIGH (ref 10–47)
AST: 83 U/L — ABNORMAL HIGH (ref 11–38)
Alkaline Phosphatase: 275 U/L — ABNORMAL HIGH (ref 26–84)
BUN, Bld: 19 mg/dL (ref 7–22)
CALCIUM: 9.5 mg/dL (ref 8.0–10.3)
CHLORIDE: 103 meq/L (ref 98–108)
CO2: 33 meq/L (ref 18–33)
Creat: 0.4 mg/dl — ABNORMAL LOW (ref 0.6–1.2)
Glucose, Bld: 114 mg/dL (ref 73–118)
POTASSIUM: 4.2 meq/L (ref 3.3–4.7)
Sodium: 149 mEq/L — ABNORMAL HIGH (ref 128–145)
Total Bilirubin: 0.7 mg/dl (ref 0.20–1.60)
Total Protein: 6.9 g/dL (ref 6.4–8.1)

## 2014-10-28 MED ORDER — SODIUM CHLORIDE 0.9 % IV SOLN
Freq: Once | INTRAVENOUS | Status: AC
  Administered 2014-10-28: 15:00:00 via INTRAVENOUS

## 2014-10-28 MED ORDER — SODIUM CHLORIDE 0.9 % IJ SOLN
10.0000 mL | INTRAMUSCULAR | Status: DC | PRN
  Administered 2014-10-28: 10 mL via INTRAVENOUS
  Filled 2014-10-28: qty 10

## 2014-10-28 MED ORDER — HEPARIN SOD (PORK) LOCK FLUSH 100 UNIT/ML IV SOLN
500.0000 [IU] | Freq: Once | INTRAVENOUS | Status: AC
  Administered 2014-10-28: 500 [IU] via INTRAVENOUS
  Filled 2014-10-28: qty 5

## 2014-10-28 MED ORDER — ALBUTEROL SULFATE (2.5 MG/3ML) 0.083% IN NEBU
2.5000 mg | INHALATION_SOLUTION | Freq: Once | RESPIRATORY_TRACT | Status: AC
  Administered 2014-10-28: 2.5 mg via RESPIRATORY_TRACT
  Filled 2014-10-28: qty 3

## 2014-10-28 MED ORDER — IPRATROPIUM BROMIDE 0.02 % IN SOLN
0.5000 mg | Freq: Once | RESPIRATORY_TRACT | Status: AC
  Administered 2014-10-28: 0.5 mg via RESPIRATORY_TRACT

## 2014-10-28 NOTE — Patient Instructions (Signed)
Dehydration, Adult Dehydration is when you lose more fluids from the body than you take in. Vital organs like the kidneys, brain, and heart cannot function without a proper amount of fluids and salt. Any loss of fluids from the body can cause dehydration.  CAUSES   Vomiting.  Diarrhea.  Excessive sweating.  Excessive urine output.  Fever. SYMPTOMS  Mild dehydration  Thirst.  Dry lips.  Slightly dry mouth. Moderate dehydration  Very dry mouth.  Sunken eyes.  Skin does not bounce back quickly when lightly pinched and released.  Dark urine and decreased urine production.  Decreased tear production.  Headache. Severe dehydration  Very dry mouth.  Extreme thirst.  Rapid, weak pulse (more than 100 beats per minute at rest).  Cold hands and feet.  Not able to sweat in spite of heat and temperature.  Rapid breathing.  Blue lips.  Confusion and lethargy.  Difficulty being awakened.  Minimal urine production.  No tears. DIAGNOSIS  Your caregiver will diagnose dehydration based on your symptoms and your exam. Blood and urine tests will help confirm the diagnosis. The diagnostic evaluation should also identify the cause of dehydration. TREATMENT  Treatment of mild or moderate dehydration can often be done at home by increasing the amount of fluids that you drink. It is best to drink small amounts of fluid more often. Drinking too much at one time can make vomiting worse. Refer to the home care instructions below. Severe dehydration needs to be treated at the hospital where you will probably be given intravenous (IV) fluids that contain water and electrolytes. HOME CARE INSTRUCTIONS   Ask your caregiver about specific rehydration instructions.  Drink enough fluids to keep your urine clear or pale yellow.  Drink small amounts frequently if you have nausea and vomiting.  Eat as you normally do.  Avoid:  Foods or drinks high in sugar.  Carbonated  drinks.  Juice.  Extremely hot or cold fluids.  Drinks with caffeine.  Fatty, greasy foods.  Alcohol.  Tobacco.  Overeating.  Gelatin desserts.  Wash your hands well to avoid spreading bacteria and viruses.  Only take over-the-counter or prescription medicines for pain, discomfort, or fever as directed by your caregiver.  Ask your caregiver if you should continue all prescribed and over-the-counter medicines.  Keep all follow-up appointments with your caregiver. SEEK MEDICAL CARE IF:  You have abdominal pain and it increases or stays in one area (localizes).  You have a rash, stiff neck, or severe headache.  You are irritable, sleepy, or difficult to awaken.  You are weak, dizzy, or extremely thirsty. SEEK IMMEDIATE MEDICAL CARE IF:   You are unable to keep fluids down or you get worse despite treatment.  You have frequent episodes of vomiting or diarrhea.  You have blood or green matter (bile) in your vomit.  You have blood in your stool or your stool looks black and tarry.  You have not urinated in 6 to 8 hours, or you have only urinated a small amount of very dark urine.  You have a fever.  You faint. MAKE SURE YOU:   Understand these instructions.  Will watch your condition.  Will get help right away if you are not doing well or get worse. Document Released: 01/18/2005 Document Revised: 04/12/2011 Document Reviewed: 09/07/2010 ExitCare Patient Information 2015 [employer]. This information is not intended to replace advice given to you by your health care provider. Make sure you discuss any questions you have with your health care   provider.  

## 2014-10-28 NOTE — Progress Notes (Signed)
Hematology and Oncology Follow Up Visit  Jeffery [name] [id number] [birth year] 24 y.o. 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 is here today with his guardian for follow-up. He was hospitalized for sepsis pneumonia last week. He is here today for a follow-up. He still has a cough that is no longer productive. He has bilateral wheezes in the upper lobes. His lower lobes are clear on auscultation. We will give him a breathing treatment while he is here today. He is much more alert today and appears to be slowly improving.  He was discharged home on Cipro but developed severe diarrhea and was then changed to Augmentin.  His diarrhea is improving and there has been no blood in his stool. His bowel sounds are normal. No hyperactivity.  He has had no more fevers. He is tachycardic today. His mother is concerned that he may be dehydrated after being on lasix every other day last week. His last dose was on Friday. She also states that his urine is dark.  No swelling in his extremities. No lymphadenopathy found on exam.  He is on 2 L [place] throughout the day and 2L with his CPAP at night. His O2 Sat is 95-96%. They continue to admister his tube feeds slowly to help prevent aspiration. His WBC count is 7.1 so he will not need Neupogen today.   Medications:    Medication List       This list is accurate as of: 10/28/14  4:34 PM.  Always use your most recent med list.               acetaminophen 160 MG/5ML solution  Commonly known as:  TYLENOL  500 mg by Gastric Tube route every 6 (six) hours as needed for moderate pain or fever.     acetaminophen 325 MG suppository  Commonly known as:  TYLENOL  Place 650 mg rectally every 6 (six) hours as needed for fever.     albuterol (2.5 MG/3ML) 0.083% nebulizer solution  Commonly known as:  PROVENTIL    Take 2.5 mg by nebulization See admin instructions. Give 1 vial (2.5 mg) twice daily (8am and 8pm) and every 4 hours as needed for shortness of breath or wheezing     albuterol 108 (90 BASE) MCG/ACT inhaler  Commonly known as:  PROVENTIL HFA;VENTOLIN HFA  Inhale 2 puffs into the lungs every 4 (four) hours as needed for shortness of breath.     amoxicillin-clavulanate 600-42.9 MG/5ML suspension  Commonly known as:  AUGMENTIN  Place 5 mLs (600 mg total) into feeding tube 2 (two) times daily.     baclofen 94765 MCG/20ML Soln  Commonly known as:  GABLOFEN  by Intrathecal route continuous. 385.2 mcg in 24 hours     baclofen 10 MG tablet  Commonly known as:  LIORESAL  CRUSH AND GIVE 1 TABLET 3 TIMES PER DAY     bag balm Oint ointment  Apply 1 application topically See admin instructions. Apply to sacral area with each diaper change     BALMEX EX  Apply 1 application topically See admin instructions. Apply to sacral with each diaper change     BOTOX IJ  Inject as directed See admin instructions. Every 3 months     calcium carbonate (dosed in mg elemental calcium) 1250 (500 CA) MG/5ML  1,250 mg by Gastric Tube route  3 (three) times daily. 8am , 2pm, and 8pm     cefdinir 250 MG/5ML suspension  Commonly known as:  OMNICEF     cloBAZam 2.5 MG/ML solution  Commonly known as:  ONFI  Place 2.5 mL in gastrostomy tube 3 times daily     DIASTAT ACUDIAL 20 MG Gel  Generic drug:  diazepam  Place 12.5 mg rectally as needed (for seizure lasting 2 minutes or longer or repetitive seizures - no more than 1 dose in 12 hours (not given with nasal versed)).     dicyclomine 10 MG/5ML syrup  Commonly known as:  BENTYL  10 mg by Gastric Tube route every 8 (eight) hours as needed (abdominal cramps). Do not exceed 5 days in one week     diphenoxylate-atropine 2.5-0.025 MG/5ML liquid  Commonly known as:  LOMOTIL  10 mLs by Gastric Tube route every 6 (six) hours as needed for diarrhea or loose stools.      feeding supplement (PRO-STAT SUGAR FREE 64) Liqd  Take 30 mLs by mouth 2 (two) times daily.     filgrastim 480 MCG/0.8ML Sosy injection  Commonly known as:  NEUPOGEN  Inject 0.8 mLs (480 mcg total) into the skin once.     fluconazole 40 MG/ML suspension  Commonly known as:  DIFLUCAN  Place 5 mLs (200 mg total) into feeding tube daily.     folic acid 1 MG tablet  Commonly known as:  FOLVITE  1 mg by Gastric Tube route daily. 8:00am per G tube     free water Soln  172 mLs by Gastric Tube route 4 (four) times daily. After each can of feeding supplement     furosemide 10 MG/ML solution  Commonly known as:  LASIX  20 mg by Gastric Tube route daily as needed for fluid or edema.     gabapentin 250 MG/5ML solution  Commonly known as:  NEURONTIN  Take 18 mL 3 times daily     heparin flush (porcine) 100 UNIT/ML injection  5 mLs (500 Units total) by Intracatheter route as needed (Prior to de-accessing port).     ibuprofen 100 MG/5ML suspension  Commonly known as:  ADVIL,MOTRIN  400 mg by Gastric Tube route every 6 (six) hours as needed for fever (pain).     ketoconazole 2 % cream  Commonly known as:  NIZORAL  Apply 1 application topically 2 (two) times daily as needed for irritation (yeast rash from antibiotics).     lidocaine 5 % ointment  Commonly known as:  XYLOCAINE  APPLY AS DIRECTED 30 minutes PRIOR TO BLOOD DRAWS     metoCLOPramide 5 MG/5ML solution  Commonly known as:  REGLAN  Place 10 mLs (10 mg total) into feeding tube 4 (four) times daily -  before meals and at bedtime.     metroNIDAZOLE 50 mg/ml oral suspension  Commonly known as:  FLAGYL  Take 10 mLs (500 mg total) by mouth 3 (three) times daily.     midazolam 5 MG/ML injection  Commonly known as:  VERSED  Place 2 mLs (10 mg total) into the nose once. Draw up 11ml in 2 syringes. Remove blue vial access device. Attach syringe to nasal atomizer for intranasal administration. Give 83ml in right nostril x 2 for  seizures lasting 2 minutes or longer or for repetitive seizures in a short period of time.     multivitamin Liqd  10 mLs by Gastric Tube route daily.     mupirocin ointment 2 %  Commonly  known as:  BACTROBAN  Apply 1 application topically as needed (to G-T site).     omeprazole 2 mg/mL Susp  Commonly known as:  PRILOSEC  Take 20 mg by mouth 2 (two) times daily.     OXYGEN  Inhale into the lungs. Oxygen PRN to keep O2 Sat at 90%     potassium chloride 20 MEQ packet  Commonly known as:  KLOR-CON  Give 20 meq daily except twice daily with lasix, given through J port     prednisoLONE 15 MG/5ML syrup  Commonly known as:  PRELONE  PLACE 10 MLS INTO FEEDING TUBE TWICE A DAY X3DAYS,THEN 10 MLS DAILY X3DAYS,THEN 5MLS DAILY X3DAYS     simethicone 125 MG chewable tablet  Commonly known as:  MYLICON  527 mg by Gastric Tube route See admin instructions. 125mg  caplet added to each can of Two Cal 3 times daily     sodium chloride 0.9 % injection  Flush port after use with 10cc NS. Disp# 10 - 10cc syringes     sodium phosphate enema  Commonly known as:  FLEET  Place 1 enema rectally daily as needed (gas buildup/ bloating).     sucralfate 1 GM/10ML suspension  Commonly known as:  CARAFATE  1 g by Gastric Tube route 4 (four) times daily. 7am, 12pm, 5pm, 7pm Administer alone 30 minutes prior to other medications.     SUDAFED CHILDRENS 15 MG/5ML Liqd  Generic drug:  Pseudoephedrine HCl  30 mg by Gastric Tube route 3 (three) times daily. 8am, 2pm, 8pm     TWOCAL HN Liqd  237 mLs by Gastric Tube route See admin instructions. T.3 can at 46 cc hour x 12 hours - water 172 cc pre and post each and extra 172 bid.     PROMOD Liqd  Take 30 mLs by mouth 2 (two) times daily. 8am and 5pm     Vitamin C Liqd  300 mLs by Gastric Tube route daily.     Vitamin D3 400 UNIT/ML Liqd  Give 2.5 mLs by tube daily.     ZINC PO  5 mLs by Gastric Tube route daily. 244 mg / 5 mL zinc solution compounded by  [facility]        Allergies:  Allergies  Allergen Reactions  . Depakote [Divalproex Sodium]     Causes pancreatitis   . Vimpat [Lacosamide] Rash  . Keppra [Levetiracetam] Other (See Comments)    Bone marrow suppression  . Adhesive [Tape] Other (See Comments)    Rips skin off (paper tape is ok)  . Amoxicillin-Pot Clavulanate Rash    If given with fluconazole rash is not as severe or does not appear at all  . Neulasta [Pegfilgrastim] Other (See Comments)    Fever, tachycardia    Past Medical History, Surgical history, Social history, and Family History were reviewed and updated.  Review of Systems: All other 10 point review of systems is negative.   Physical Exam:  axillary temperature is 98.6 F (37 C). His blood pressure is 113/80 and his pulse is 128. His respiration is 20 and oxygen saturation is 92%.   Wt Readings from Last 3 Encounters:  10/19/14 142 lb 3.2 oz (64.5 kg)  07/25/14 127 lb 8 oz (57.834 kg)  07/14/14 127 lb 13.9 oz (58 kg)    Ocular: Sclerae unicteric, pupils equal, round and reactive to light Ear-nose-throat: Oropharynx clear, dentition fair Lymphatic: No cervical or supraclavicular adenopathy Lungs no rales or rhonchi, good excursion  bilaterally Heart regular rate and rhythm, no murmur appreciated Abd soft, nontender, positive bowel sounds MSK has some atrophy with the cerebral palsy  Neuro he is alert but nonverbal  Lab Results  Component Value Date   WBC 7.1 10/28/2014   HGB 11.6* 10/28/2014   HCT 38.3* 10/28/2014   MCV 106* 10/28/2014   PLT 131* 10/28/2014   Lab Results  Component Value Date   FERRITIN 1054* 06/22/2014   IRON 19* 06/22/2014   TIBC 133* 06/22/2014   UIBC 114 06/22/2014   IRONPCTSAT 14* 06/22/2014   Lab Results  Component Value Date   RETICCTPCT 2.8* 07/03/2014   RBC 3.63* 10/28/2014   RETICCTABS 99.1 07/03/2014   No results found for: KPAFRELGTCHN, LAMBDASER, KAPLAMBRATIO No results found for: IGGSERUM,  IGA, IGMSERUM No results found for: Odetta Pink, SPEI   Chemistry      Component Value Date/Time   NA 149* 10/28/2014 1326   NA 138 10/19/2014 1813   K 4.2 10/28/2014 1326   K 4.2 10/19/2014 1813   CL 103 10/28/2014 1326   CL 93* 10/19/2014 1813   CO2 33 10/28/2014 1326   CO2 32 10/19/2014 1813   BUN 19 10/28/2014 1326   BUN 19 10/19/2014 1813   CREATININE 0.4* 10/28/2014 1326   CREATININE 0.77 10/19/2014 1813      Component Value Date/Time   CALCIUM 9.5 10/28/2014 1326   CALCIUM 9.0 10/19/2014 1813   ALKPHOS 275* 10/28/2014 1326   ALKPHOS 180* 10/19/2014 1813   AST 83* 10/28/2014 1326   AST 53* 10/19/2014 1813   ALT 192* 10/28/2014 1326   ALT 53 10/19/2014 1813   BILITOT 0.70 10/28/2014 1326   BILITOT 1.1 10/19/2014 1813     Impression and Plan: [name] is a 24 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 He also has cerebral palsy and is bed/wheelchair bound. He is slowly improving after having sepsis pneumonia and being hospitalized last week.  He is finishing up his Augmentin at this time. His diarrhea is getting better each day.  He last received Neupogen last week. His WBC count today is 7.1 so he will not need a dose today.  His urine is dark and he is tachycardic today. He was on lasix last week and may be a little on the dry side today. We will go ahead and give him 1/2 liter of normal saline while he is here in the office.  He still has a dry cough with bilateral upper lobe wheezes. We will also give him a duoneb treatment.  He now requires 2L day and night.  We will continue to have his family follow-up with [place] as needed.  His parents know to call here with any hematology related questions or concerns.  This was a shared visit with Dr. Marin Olp and he is in agreement with the above.   Eliezer Bottom, NP 9/26/20164:34 PM   Addendum:  I agree with the above note.  He just looks pretty rough.  I  think they by does aspirate little bit.  We will go ahead and give him some fluids. We will go ahead and give him a nebulizer.  Laurey Arrow

## 2014-11-01 ENCOUNTER — Telehealth: Payer: Self-pay | Admitting: *Deleted

## 2014-11-01 NOTE — Telephone Encounter (Signed)
Received labs from [facility]. [name] reviewed and [name] DOES NOT need neupogen today. [name] is aware.

## 2014-11-05 ENCOUNTER — Telehealth: Payer: Self-pay | Admitting: *Deleted

## 2014-11-05 ENCOUNTER — Encounter: Payer: Self-pay | Admitting: Nurse Practitioner

## 2014-11-05 NOTE — Telephone Encounter (Signed)
[name] notified of lab results from [facility] collection. [name] DOES NOT want patient to have neupogen. [name] is aware.   [name] does want labs drawn again on Thursday. Spoke to [name] at [facility] and labs will be drawn on Thursday am. [name] is aware.

## 2014-11-05 NOTE — Progress Notes (Signed)
Spoke to mom, [name] and verfied with her that per Dr. Marin Olp that he does not need Neupogen today but he would like to have his labs redrawn on Thursday morning. Roselyn Reef will call and let homecare know to do a recollect and send the office the results. Mother verified information and verbalized her appreciation with the plan.

## 2014-11-05 NOTE — Telephone Encounter (Signed)
Jeffery [name] would like the office updated on [facility] health came out today to draw blood work. They also collected urine and sputum samples for culture.  Jeffery [name] is completed his antibiotic regimen. He is starting to show signs of infection once again. Patient is more dyspneic and having some temperatures (TMax 102.8). He is requiring minimal suctioning. Has some coarse lung sounds with some areas of clear lung sounds. Requiring breathing treatments every 6hours. Lomotil is being weaned, diarrhea is much better. He has some skin breakdown on his bottom. It's not open, but the skin is black. She is certain that the skin will open up leaving [name] with a pressure ulcer. Dr Jobe Igo, the patient's PCP is also aware of all of the above.

## 2014-11-07 ENCOUNTER — Ambulatory Visit (HOSPITAL_COMMUNITY)
Admission: RE | Admit: 2014-11-07 | Discharge: 2014-11-07 | Disposition: A | Discharge status: Home / Self Care | Payer: Medicaid Other | Source: Ambulatory Visit | Attending: Hematology & Oncology | Admitting: Hematology & Oncology

## 2014-11-07 ENCOUNTER — Telehealth: Payer: Self-pay | Admitting: *Deleted

## 2014-11-07 DIAGNOSIS — G808 Other cerebral palsy: Secondary | ICD-10-CM | POA: Insufficient documentation

## 2014-11-07 DIAGNOSIS — D638 Anemia in other chronic diseases classified elsewhere: Secondary | ICD-10-CM | POA: Insufficient documentation

## 2014-11-07 NOTE — Telephone Encounter (Signed)
Received lab results from [facility] of results. [name] DOES want patient to receive Neupogen today. [name] also wants patient to get 2 units of PRBC tomorrow in the office. Notified [name] of tomorrow's appointment. She is aware.

## 2014-11-08 ENCOUNTER — Other Ambulatory Visit: Payer: Self-pay | Admitting: Nurse Practitioner

## 2014-11-08 ENCOUNTER — Ambulatory Visit (HOSPITAL_BASED_OUTPATIENT_CLINIC_OR_DEPARTMENT_OTHER): Payer: Medicaid Other

## 2014-11-08 ENCOUNTER — Other Ambulatory Visit: Payer: Self-pay | Admitting: Hematology & Oncology

## 2014-11-08 ENCOUNTER — Telehealth: Payer: Self-pay | Admitting: *Deleted

## 2014-11-08 ENCOUNTER — Other Ambulatory Visit (HOSPITAL_BASED_OUTPATIENT_CLINIC_OR_DEPARTMENT_OTHER): Payer: Medicaid Other

## 2014-11-08 VITALS — BP 118/56 | HR 115 | Temp 99.4°F | Resp 20

## 2014-11-08 DIAGNOSIS — D638 Anemia in other chronic diseases classified elsewhere: Secondary | ICD-10-CM | POA: Diagnosis present

## 2014-11-08 DIAGNOSIS — D61818 Other pancytopenia: Secondary | ICD-10-CM

## 2014-11-08 DIAGNOSIS — G40319 Generalized idiopathic epilepsy and epileptic syndromes, intractable, without status epilepticus: Secondary | ICD-10-CM

## 2014-11-08 DIAGNOSIS — Z23 Encounter for immunization: Secondary | ICD-10-CM | POA: Diagnosis present

## 2014-11-08 DIAGNOSIS — G808 Other cerebral palsy: Secondary | ICD-10-CM | POA: Diagnosis not present

## 2014-11-08 DIAGNOSIS — J13 Pneumonia due to Streptococcus pneumoniae: Secondary | ICD-10-CM

## 2014-11-08 DIAGNOSIS — G40219 Localization-related (focal) (partial) symptomatic epilepsy and epileptic syndromes with complex partial seizures, intractable, without status epilepticus: Secondary | ICD-10-CM

## 2014-11-08 LAB — CBC WITH DIFFERENTIAL (CANCER CENTER ONLY)
BASO#: 0 10*3/uL (ref 0.0–0.2)
BASO%: 0.2 % (ref 0.0–2.0)
EOS ABS: 0.1 10*3/uL (ref 0.0–0.5)
EOS%: 2.5 % (ref 0.0–7.0)
HCT: 30 % — ABNORMAL LOW (ref 38.7–49.9)
HEMOGLOBIN: 8.9 g/dL — AB (ref 13.0–17.1)
LYMPH#: 0.4 10*3/uL — ABNORMAL LOW (ref 0.9–3.3)
LYMPH%: 6.2 % — AB (ref 14.0–48.0)
MCH: 32.5 pg (ref 28.0–33.4)
MCHC: 29.7 g/dL — ABNORMAL LOW (ref 32.0–35.9)
MCV: 110 fL — AB (ref 82–98)
MONO#: 0.5 10*3/uL (ref 0.1–0.9)
MONO%: 8.5 % (ref 0.0–13.0)
NEUT%: 82.6 % — ABNORMAL HIGH (ref 40.0–80.0)
NEUTROS ABS: 4.7 10*3/uL (ref 1.5–6.5)
PLATELETS: 115 10*3/uL — AB (ref 145–400)
RBC: 2.74 10*6/uL — AB (ref 4.20–5.70)
RDW: 20.2 % — ABNORMAL HIGH (ref 11.1–15.7)
WBC: 5.7 10*3/uL (ref 4.0–10.0)

## 2014-11-08 LAB — CMP (CANCER CENTER ONLY)
ALBUMIN: 3.1 g/dL — AB (ref 3.3–5.5)
ALK PHOS: 147 U/L — AB (ref 26–84)
ALT: 69 U/L — AB (ref 10–47)
AST: 40 U/L — ABNORMAL HIGH (ref 11–38)
BUN: 16 mg/dL (ref 7–22)
CHLORIDE: 101 meq/L (ref 98–108)
CO2: 30 mEq/L (ref 18–33)
CREATININE: 0.6 mg/dL (ref 0.6–1.2)
Calcium: 9.7 mg/dL (ref 8.0–10.3)
Glucose, Bld: 97 mg/dL (ref 73–118)
Potassium: 4.1 mEq/L (ref 3.3–4.7)
SODIUM: 138 meq/L (ref 128–145)
TOTAL PROTEIN: 6.2 g/dL — AB (ref 6.4–8.1)
Total Bilirubin: 0.5 mg/dl (ref 0.20–1.60)

## 2014-11-08 LAB — PREPARE RBC (CROSSMATCH)

## 2014-11-08 MED ORDER — CLOBAZAM 2.5 MG/ML PO SUSP: ORAL | Status: DC

## 2014-11-08 MED ORDER — PNEUMOCOCCAL 13-VAL CONJ VACC IM SUSP
0.5000 mL | INTRAMUSCULAR | Status: DC
  Filled 2014-11-08: qty 0.5

## 2014-11-08 MED ORDER — PNEUMOCOCCAL VAC POLYVALENT 25 MCG/0.5ML IJ INJ
0.5000 mL | INJECTION | Freq: Once | INTRAMUSCULAR | Status: AC
  Administered 2014-11-08: 0.5 mL via INTRAMUSCULAR
  Filled 2014-11-08: qty 0.5

## 2014-11-08 MED ORDER — FUROSEMIDE 10 MG/ML IJ SOLN: 20.0000 mg | Freq: Once | INTRAMUSCULAR | Status: DC

## 2014-11-08 NOTE — Patient Instructions (Signed)
Blood Transfusion  A blood transfusion is a procedure in which you receive donated blood through an IV tube. You may need a blood transfusion because of illness, surgery, or injury. The blood may come from a donor, or it may be your own blood that you donated previously. The blood given in a transfusion is made up of different types of cells. You may receive:  Red blood cells. These carry oxygen and replace lost blood.  Platelets. These control bleeding.  Plasma. Thishelps blood to clot. If you have hemophilia or another clotting disorder, you may also receive other types of blood products. LET [facility] CARE PROVIDER KNOW ABOUT:  Any allergies you have.  All medicines you are taking, including vitamins, herbs, eye drops, creams, and over-the-counter medicines.  Previous problems you or members of your family have had with the use of anesthetics.  Any blood disorders you have.  Previous surgeries you have had.  Any medical conditions you may have.  Any previous reactions you have had during a blood transfusion.  RISKS AND COMPLICATIONS Generally, this is a safe procedure. However, problems may occur, including:  Having an allergic reaction to something in the donated blood.  Fever. This may be a reaction to the white blood cells in the transfused blood.  Iron overload. This can happen from having many transfusions.  Transfusion-related acute lung injury (TRALI). This is a rare reaction that causes lung damage. The cause is not known.TRALI can occur within hours of a transfusion or several days later.  Sudden (acute) or delayed hemolytic reactions. This happens if your blood does not match the cells in your transfusion. Your body's defense system (immune system) may try to attack the new cells. This complication is rare.  Infection. This is rare. BEFORE THE PROCEDURE  You may have a blood test to determine your blood type. This is necessary to know what kind of blood your  body will accept.  If you are going to have a planned surgery, you may donate your own blood. This may be done in case you need to have a transfusion.  If you have had an allergic reaction to a transfusion in the past, you may be given medicine to help prevent a reaction. Take this medicine only as directed by your health care provider.  You will have your temperature, blood pressure, and pulse monitored before the transfusion. PROCEDURE   An IV will be started in your hand or arm.  The bag of donated blood will be attached to your IV tube and given into your vein.  Your temperature, blood pressure, and pulse will be monitored regularly during the transfusion. This monitoring is done to detect early signs of a transfusion reaction.  If you have any signs or symptoms of a reaction, your transfusion will be stopped and you may be given medicine.  When the transfusion is over, your IV will be removed.  Pressure may be applied to the IV site for a few minutes.  A bandage (dressing) will be applied. The procedure may vary among health care providers and hospitals. AFTER THE PROCEDURE  Your blood pressure, temperature, and pulse will be monitored regularly.   This information is not intended to replace advice given to you by your health care provider. Make sure you discuss any questions you have with your health care provider.   Document Released: 01/16/2000 Document Revised: 02/08/2014 Document Reviewed: 11/28/2013   Pneumococcal Vaccine, Polyvalent solution for injection What is this medicine? PNEUMOCOCCAL VACCINE, POLYVALENT (  NEU mo KOK al vak SEEN, pol ee VEY luhnt) is a vaccine to prevent pneumococcus bacteria infection. These bacteria are a major cause of ear infections, Strep throat infections, and serious pneumonia, meningitis, or blood infections worldwide. These vaccines help the body to produce antibodies (protective substances) that help your body defend against these  bacteria. This vaccine is recommended for people 9 years of age and older with health problems. It is also recommended for all adults over 83 years old. This vaccine will not treat an infection. This medicine may be used for other purposes; ask your health care provider or pharmacist if you have questions. What should I tell my health care provider before I take this medicine? They need to know if you have any of these conditions: -bleeding problems -bone marrow or organ transplant -cancer, Hodgkin's disease -fever -infection -immune system problems -low platelet count in the blood -seizures -an unusual or allergic reaction to pneumococcal vaccine, diphtheria toxoid, other vaccines, latex, other medicines, foods, dyes, or preservatives -pregnant or trying to get pregnant -breast-feeding How should I use this medicine? This vaccine is for injection into a muscle or under the skin. It is given by a health care professional. A copy of Vaccine Information Statements will be given before each vaccination. Read this sheet carefully each time. The sheet may change frequently. Talk to your pediatrician regarding the use of this medicine in children. While this drug may be prescribed for children as young as 73 years of age for selected conditions, precautions do apply. Overdosage: If you think you have taken too much of this medicine contact a poison control center or emergency room at once. NOTE: This medicine is only for you. Do not share this medicine with others. What if I miss a dose? It is important not to miss your dose. Call your doctor or health care professional if you are unable to keep an appointment. What may interact with this medicine? -medicines for cancer chemotherapy -medicines that suppress your immune function -medicines that treat or prevent blood clots like warfarin, enoxaparin, and dalteparin -steroid medicines like prednisone or cortisone This list may not describe all  possible interactions. Give your health care provider a list of all the medicines, herbs, non-prescription drugs, or dietary supplements you use. Also tell them if you smoke, drink alcohol, or use illegal drugs. Some items may interact with your medicine. What should I watch for while using this medicine? Mild fever and pain should go away in 3 days or less. Report any unusual symptoms to your doctor or health care professional. What side effects may I notice from receiving this medicine? Side effects that you should report to your doctor or health care professional as soon as possible: -allergic reactions like skin rash, itching or hives, swelling of the face, lips, or tongue -breathing problems -confused -fever over 102 degrees F -pain, tingling, numbness in the hands or feet -seizures -unusual bleeding or bruising -unusual muscle weakness Side effects that usually do not require medical attention (report to your doctor or health care professional if they continue or are bothersome): -aches and pains -diarrhea -fever of 102 degrees F or less -headache -irritable -loss of appetite -pain, tender at site where injected -trouble sleeping This list may not describe all possible side effects. Call your doctor for medical advice about side effects. You may report side effects to FDA at 1-800-FDA-1088. Where should I keep my medicine? This does not apply. This vaccine is given in a clinic, pharmacy, doctor's  office, or other health care setting and will not be stored at home. NOTE: This sheet is a summary. It may not cover all possible information. If you have questions about this medicine, talk to your doctor, pharmacist, or health care provider.    2016, Elsevier/Gold Standard. (2007-08-25 14:32:37)  Elsevier Interactive Patient Education [employer].

## 2014-11-08 NOTE — Telephone Encounter (Signed)
I left a message for mother to call.  Onfi could be causing him to be sleepy but there are many other possibilities.I rewrote the prescription to reflect the correct dose.

## 2014-11-08 NOTE — Telephone Encounter (Signed)
Jeffery [name] called and states that she was calling to give an update on Jeffery [name]. She states that last night he would not wake up when they would try to wake him. She reports to him only waking up at 1am when [name]'s mother was called and she spoke to him over the phone. Jeffery [name] reports that Jeffery [name]'s Hgb has been low and she does not think it is connected to anything Neuro but more to exhaustion. She states that he was given Benadryl before his injection yesterday and his blood pressure has been dropping as well. Jeffery [name] states that with [name] being on ONFI Dr. Gaynell Face told her to keep an eye out for any symptoms as ONFI could sedate. Jeffery [name] states that they are taking 1 unit of blood from him this morning and she will be back at home this afternoon. She would like to be called later in the day once she is at home.   CB#: [phone]

## 2014-11-11 ENCOUNTER — Telehealth: Payer: Self-pay | Admitting: Neurology

## 2014-11-11 DIAGNOSIS — G808 Other cerebral palsy: Secondary | ICD-10-CM | POA: Diagnosis not present

## 2014-11-11 LAB — TYPE AND SCREEN
ABO/RH(D): O POS
Antibody Screen: NEGATIVE
UNIT DIVISION: 0

## 2014-11-11 NOTE — Telephone Encounter (Signed)
I just got off the phone with the patient mom she said that Jeffery [name] is not going to be able to make it to his botox appointment this Wednesday. He is just getting out of the hospital with pneumonia and his white counts are low, so she cant take him out in the public for a while she said it might be a month before she can bring him out and to reschedule with his botox.

## 2014-11-11 NOTE — Telephone Encounter (Signed)
Jeffery [name] was on 2.5 mL (6.25 mg) 3 times daily. I had initially written for 1 mL (2.5 mg) 3 times daily.  Currently he is taking 2 mL 3 times daily.  We have decided to drop him down to 1-1/2 mL 3 times daily and observe his response.

## 2014-11-11 NOTE — Telephone Encounter (Signed)
[name] left message to have Dr. Gaynell Face return her call when possible.  CB#: [phone] Or  [phone]

## 2014-11-11 NOTE — Telephone Encounter (Signed)
[name] called back and states she apologized for missing the call Friday due to her phone doing an update. She states that there is something going on with Jeffery [name], she states that he will not wake up unless he speaks to [place] mother. She states he is not comatose but is possibly oversedated but not seizing. She states that she cut him back to 2 cc's yesterday until she was able to talk to someone in our office. She states that he is awake and alert now but then has episodes of not being able to be woken. When Jeffery [name] is awake his heart rate is 110-120 and then  Asleep it's in the 60-70s has not done that since before Christmas. He has been doing well otherwise and has been improving. [name] states that this call is not an emergency or urgent but would like a call back as soon as possible.  CB#:[phone]

## 2014-11-11 NOTE — Telephone Encounter (Signed)
I left a message for mother to call back. 

## 2014-11-11 NOTE — Telephone Encounter (Signed)
Patient mom wanted me to let you know that he is not going to be able to come to his botox appointment on Wednesday because he just got home from the hospital with pneumonia also his white counts are low. She said it might be a month before she can reschedule for his botox.

## 2014-11-12 ENCOUNTER — Encounter: Payer: Self-pay | Admitting: *Deleted

## 2014-11-12 ENCOUNTER — Telehealth: Payer: Self-pay | Admitting: *Deleted

## 2014-11-12 NOTE — Telephone Encounter (Signed)
Received lab results from [place]. [name] reviewed and he DOES want [name] to receive neupogen today. Spoke to [place] and she understands to give Neupogen today. Lab results sent to [facility] per her request.

## 2014-11-12 NOTE — Telephone Encounter (Signed)
[name] called to clarify ONFI dosage for [place]. She also wanted to make sure his baclofen appt was scheduled.

## 2014-11-12 NOTE — Telephone Encounter (Signed)
Noted, thank you

## 2014-11-12 NOTE — Telephone Encounter (Signed)
Called and spoke to [place], and I advised her of Dr. Melanee Left prior note where he spoke to her yesterday and advised ONFI level be dropped to 1-1/2 mL 3 times daily and observe response. I also let her know that his Baclofen Pump refill was scheduled for 12/24/2014. She agreed and will be calling back 11/14/2014 to report dose change observance.

## 2014-11-13 ENCOUNTER — Ambulatory Visit: Payer: Medicaid Other | Admitting: Neurology

## 2014-11-13 NOTE — Telephone Encounter (Signed)
[name] called yesterday and stated in VM that patient has been alert but dozed out, she states that he is also making a constant chewing motions which he had not previously done. She states that she will give him the diastat if he continues but is not worried and will continue to monitor this.   CB#: [phone]

## 2014-11-13 NOTE — Telephone Encounter (Addendum)
[name] called again and states that Jeffery [name] had a seizure yesterday and his stats fell in the 60's and got blue around the mouth. Seizure did not last long, only 30-45 seconds, she increased oxygen and O2 was back up to 100 within 5-10 seconds. She states he was sleepy but not as sedated. He had another seizure around 9 pm last night and she had to change the sheets this time. He was tachycardic but he has episodes of this and his stats dropped to 60's again but not cyanosis present this time. [name] gave Diastat, 12.6 mg, wants to confirm this is still correct dosage. He was not postictal, usually never is unless it is at night and he is asleep. This morning he awoke very alert, talking and looks awesome. He was woken up at 930Am.   Cb: [phone]

## 2014-11-14 MED ORDER — ONFI 2.5 MG/ML PO SUSP: ORAL | Status: AC

## 2014-11-14 NOTE — Telephone Encounter (Addendum)
I called and talked to [place]. She said that Jeffery [name] was doing well today. I confirmed the Diastat dose with her as 12.5mg . She asked me to contact [facility] on [address] in [facility] to let them know that his new dose of Onfi was 1.65ml TID. I told her that I would do so and faxed them an updated Rx. [name] had no other concerns today. TG

## 2014-11-14 NOTE — Telephone Encounter (Signed)
Thanks

## 2014-11-14 NOTE — Addendum Note (Signed)
Addended by: Joelyn Oms on: 11/14/2014 04:20 PM   Modules accepted: Orders

## 2014-11-15 ENCOUNTER — Telehealth: Payer: Self-pay | Admitting: *Deleted

## 2014-11-15 NOTE — Telephone Encounter (Signed)
Patient was recently treated by Dr Jobe Igo for lung infection with levaquin. Patient responded well, but once antibiotics stopped, his symptoms worsened. His TMax for the last 24h is 102.2. He has also developed two new small pressure ulcers. [name] has been in communication with Dr Jobe Igo who is ordering another round of sputum and urine cultures. [name] would like to know if [name] would order blood work as well so that patient can receive neupogen before the weekend if needed. [name] is fine with this. Orders called in to [name] RN of [facility]. [name] notified of orders placed.

## 2014-11-18 NOTE — Telephone Encounter (Signed)
Patients caregiver called to reschedule, I tried to call her back but got no answer. Left a VM asking for her to return my call.

## 2014-11-19 NOTE — Telephone Encounter (Signed)
Called X2

## 2014-11-20 ENCOUNTER — Telehealth: Payer: Self-pay | Admitting: *Deleted

## 2014-11-20 NOTE — Telephone Encounter (Signed)
Received lab results from [place]. [name] reviewed. [name] DOES want [name] to receive a dose of neupogen today. Spoke to [place] and she is aware.

## 2014-11-22 ENCOUNTER — Telehealth: Payer: Self-pay | Admitting: *Deleted

## 2014-11-22 NOTE — Telephone Encounter (Signed)
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 The first two he had were 4 hours apart, [name] did not have to give Diastat. Intermittently he had 3 more, the last one was about 2 oclock this morning. His alarm went off when she went in there and he had been sound asleep, his HR normally runs at 110 and HR was 160. They don't last long around 45 seconds . 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 Liver Enzymes were high so she doesn't know if medication should be increased again until they figure out what that is due to. Is there something that he can be put on or maybe is this the cause of his liver enzymes being high?  CB: [phone]

## 2014-11-22 NOTE — Telephone Encounter (Signed)
Were not going to increase Onfi.  It's noted him.  I don't think is responsible for his elevated transaminases.  At present we have nothing to do but watch unfortunately.  Mother is in agreement and just called to inform me.

## 2014-12-04 ENCOUNTER — Other Ambulatory Visit: Payer: Self-pay | Admitting: *Deleted

## 2014-12-04 ENCOUNTER — Telehealth: Payer: Self-pay | Admitting: *Deleted

## 2014-12-04 DIAGNOSIS — D61818 Other pancytopenia: Secondary | ICD-10-CM

## 2014-12-04 MED ORDER — FILGRASTIM 300 MCG/ML IJ SOLN: 300.0000 ug | INTRAMUSCULAR | Status: AC

## 2014-12-04 MED ORDER — FILGRASTIM 300 MCG/ML IJ SOLN: 300.0000 ug | INTRAMUSCULAR | Status: DC

## 2014-12-04 NOTE — Telephone Encounter (Signed)
Received labs drawn by Home Health. Reviewed with [name] and he DOES want patient to receive Neupogen. Notified [name].

## 2014-12-09 ENCOUNTER — Other Ambulatory Visit: Payer: Self-pay

## 2014-12-09 DIAGNOSIS — M25559 Pain in unspecified hip: Secondary | ICD-10-CM

## 2014-12-09 MED ORDER — GABAPENTIN 250 MG/5ML PO SOLN: ORAL | Status: AC

## 2014-12-11 ENCOUNTER — Telehealth: Payer: Self-pay | Admitting: *Deleted

## 2014-12-11 NOTE — Telephone Encounter (Signed)
Received lab report from [place]. Reviewed with [name] and he DOES want patient to receive neupogen today. Jeffery [name] is aware.   [name] would also like lab draws to be moved to another day. Currently they are drawn on Tuesday, and often results are not back before [name] leaves the office at 12 noon. Spoke to [name] and lab draws will be moved to Wednesdays.

## 2014-12-16 ENCOUNTER — Telehealth: Payer: Self-pay | Admitting: Neurology

## 2014-12-16 NOTE — Telephone Encounter (Signed)
Patient's mother [name] is calling to schedule a Botox appointment for her son.  Please call.  Thanks!

## 2014-12-18 ENCOUNTER — Telehealth: Payer: Self-pay | Admitting: *Deleted

## 2014-12-18 NOTE — Telephone Encounter (Signed)
[name] reviewed labs. He DOES want [name] to receive a dose of Neupogen. [name] aware. Results faxed to [facility] per her request.

## 2014-12-23 ENCOUNTER — Other Ambulatory Visit: Payer: Self-pay | Admitting: Hematology & Oncology

## 2014-12-23 ENCOUNTER — Telehealth: Payer: Self-pay | Admitting: *Deleted

## 2014-12-23 DIAGNOSIS — J189 Pneumonia, unspecified organism: Secondary | ICD-10-CM

## 2014-12-23 MED ORDER — LEVOFLOXACIN 25 MG/ML PO SOLN: 500.0000 mg | Freq: Every day | ORAL | Status: DC

## 2014-12-23 NOTE — Telephone Encounter (Signed)
[place] office that Jeffery [name] is no longer the RN assigned to [place]. She is currently working with [employer] as to who will be assigned. She needs this office to call [facility] to give them an order for lab draw this Wednesday morning. Spoke to [place] who transcribed order.

## 2014-12-24 ENCOUNTER — Ambulatory Visit (INDEPENDENT_AMBULATORY_CARE_PROVIDER_SITE_OTHER): Payer: Medicaid Other | Admitting: Pediatrics

## 2014-12-24 ENCOUNTER — Encounter: Payer: Self-pay | Admitting: Pediatrics

## 2014-12-24 DIAGNOSIS — G808 Other cerebral palsy: Secondary | ICD-10-CM

## 2014-12-24 DIAGNOSIS — M4140 Neuromuscular scoliosis, site unspecified: Secondary | ICD-10-CM | POA: Diagnosis not present

## 2014-12-24 DIAGNOSIS — G40309 Generalized idiopathic epilepsy and epileptic syndromes, not intractable, without status epilepticus: Secondary | ICD-10-CM

## 2014-12-24 DIAGNOSIS — R1310 Dysphagia, unspecified: Secondary | ICD-10-CM | POA: Diagnosis not present

## 2014-12-24 NOTE — Patient Instructions (Signed)
The battery for the pump will run out in 6 months.  I will arrange a meeting with neurosurgery at [facility] which is the group neurosurgeons in town. Main issue is going to be his surgical risk because of his many problems with airway, white blood cell count and his fragile state.
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
## 2014-12-25 ENCOUNTER — Encounter: Payer: Self-pay | Admitting: *Deleted

## 2014-12-25 ENCOUNTER — Telehealth: Payer: Self-pay | Admitting: *Deleted

## 2014-12-25 ENCOUNTER — Other Ambulatory Visit: Payer: Self-pay | Admitting: *Deleted

## 2014-12-25 DIAGNOSIS — D638 Anemia in other chronic diseases classified elsewhere: Secondary | ICD-10-CM

## 2014-12-25 DIAGNOSIS — D61818 Other pancytopenia: Secondary | ICD-10-CM

## 2014-12-25 NOTE — Telephone Encounter (Signed)
I called to tell mother that Botox is not possibility for multiple muscles in the legs  I told her to call Dr.Harkins office to obtain an appointment for him.  Please help her if needed.  I will be happy to talk with mother about this next week.

## 2014-12-25 NOTE — Telephone Encounter (Signed)
Jeffery [name] called and left a voicemail stating that she would like me to ask Dr. Gaynell Face to call her about Baclofen pump vs. Botox for legs. She would like to discuss the possibility of  weaning him off the Baclofen and switching to oral Baclofen and using the Botox as he had previously. She would like to know if the Baclofen Pump can be left in the body even if it won't be in use?She states that Jeffery [name] also sees Dr. Krista Blue at [facility] and could correspond with Dr. Gaynell Face about this subject. She states that she does not need a call back today considering it is the holidays and her call can be returned next week at any time.  CB:[phone]

## 2014-12-25 NOTE — Progress Notes (Addendum)
[name] has been referred to Dr. Bonna Gains, PhD, MD at [facility]. Information from last visit, demographics, and insurance information was faxed to [facility], attention to Dr. Clydell Hakim ( Fax: [phone]) 12/25/2014 at 9:16Am and fax confirmation was received.  Noted, [facility]

## 2014-12-25 NOTE — Telephone Encounter (Signed)
Received patient labs from [facility]. Reviewed with [name]. [name] DOES want patient to receive neupogen today. [name] also wants patient to have transfusion next week. Appointments made and [facility] aware.

## 2014-12-30 ENCOUNTER — Ambulatory Visit (HOSPITAL_COMMUNITY)
Admission: RE | Admit: 2014-12-30 | Discharge: 2014-12-30 | Disposition: A | Discharge status: Home / Self Care | Payer: Medicaid Other | Source: Ambulatory Visit | Attending: Hematology & Oncology | Admitting: Hematology & Oncology

## 2014-12-30 ENCOUNTER — Telehealth: Payer: Self-pay | Admitting: *Deleted

## 2014-12-30 NOTE — Telephone Encounter (Signed)
[name] calling with update on patient condition. Patient continues to have fevers of 101-103. Hes had multiple breathing treatments, on the biPap at times, is having periods of respiratory distress and anxiety, and she is using lasix prn. He has 2+ pitting edema to BLE. She has been in contact with his PCP who has ordered a UA and CXR at home today. He is due to come into the office tomorrow for blood transfusion. Spoke with [name] who would like patient to continue his febrile workup with his PCP. Ideally they will find the source of infection and begin treatment. Home Health can draw labs on Wednesday. We will re-evaluate for transfusion on Thursday. [name] would like patient afebrile for 24h prior to transfusion. [name] is aware. We will tentivly reschedule patient for Thursday.

## 2014-12-30 NOTE — Telephone Encounter (Signed)
Notes and information have been faxed to Dr. Donell Sievert office for scheduling. If Jeffery [name] calls back I will assist her with information to his office.

## 2014-12-31 ENCOUNTER — Encounter (HOSPITAL_COMMUNITY): Payer: Self-pay | Admitting: Emergency Medicine

## 2014-12-31 ENCOUNTER — Inpatient Hospital Stay (HOSPITAL_COMMUNITY)
Admission: EM | Admit: 2014-12-31 | Discharge: 2015-01-07 | DRG: 871 | Disposition: A | Discharge status: Home Health | Payer: Medicaid Other | Attending: Internal Medicine | Admitting: Internal Medicine

## 2014-12-31 ENCOUNTER — Other Ambulatory Visit: Payer: Self-pay | Admitting: Hematology & Oncology

## 2014-12-31 ENCOUNTER — Inpatient Hospital Stay (HOSPITAL_COMMUNITY): Payer: Medicaid Other

## 2014-12-31 ENCOUNTER — Other Ambulatory Visit: Payer: Medicaid Other

## 2014-12-31 DIAGNOSIS — M86652 Other chronic osteomyelitis, left thigh: Secondary | ICD-10-CM | POA: Diagnosis not present

## 2014-12-31 DIAGNOSIS — R5081 Fever presenting with conditions classified elsewhere: Secondary | ICD-10-CM | POA: Diagnosis present

## 2014-12-31 DIAGNOSIS — Z6825 Body mass index (BMI) 25.0-25.9, adult: Secondary | ICD-10-CM

## 2014-12-31 DIAGNOSIS — J9811 Atelectasis: Secondary | ICD-10-CM | POA: Diagnosis present

## 2014-12-31 DIAGNOSIS — D72819 Decreased white blood cell count, unspecified: Secondary | ICD-10-CM | POA: Diagnosis not present

## 2014-12-31 DIAGNOSIS — M4140 Neuromuscular scoliosis, site unspecified: Secondary | ICD-10-CM | POA: Diagnosis present

## 2014-12-31 DIAGNOSIS — D61818 Other pancytopenia: Secondary | ICD-10-CM | POA: Diagnosis present

## 2014-12-31 DIAGNOSIS — G4733 Obstructive sleep apnea (adult) (pediatric): Secondary | ICD-10-CM | POA: Diagnosis present

## 2014-12-31 DIAGNOSIS — M810 Age-related osteoporosis without current pathological fracture: Secondary | ICD-10-CM | POA: Diagnosis present

## 2014-12-31 DIAGNOSIS — E876 Hypokalemia: Secondary | ICD-10-CM | POA: Diagnosis present

## 2014-12-31 DIAGNOSIS — G40219 Localization-related (focal) (partial) symptomatic epilepsy and epileptic syndromes with complex partial seizures, intractable, without status epilepticus: Secondary | ICD-10-CM | POA: Diagnosis present

## 2014-12-31 DIAGNOSIS — Y95 Nosocomial condition: Secondary | ICD-10-CM | POA: Diagnosis present

## 2014-12-31 DIAGNOSIS — R1310 Dysphagia, unspecified: Secondary | ICD-10-CM | POA: Diagnosis present

## 2014-12-31 DIAGNOSIS — H35109 Retinopathy of prematurity, unspecified, unspecified eye: Secondary | ICD-10-CM | POA: Diagnosis present

## 2014-12-31 DIAGNOSIS — E43 Unspecified severe protein-calorie malnutrition: Secondary | ICD-10-CM | POA: Insufficient documentation

## 2014-12-31 DIAGNOSIS — G809 Cerebral palsy, unspecified: Secondary | ICD-10-CM | POA: Diagnosis not present

## 2014-12-31 DIAGNOSIS — A419 Sepsis, unspecified organism: Principal | ICD-10-CM | POA: Diagnosis present

## 2014-12-31 DIAGNOSIS — G800 Spastic quadriplegic cerebral palsy: Secondary | ICD-10-CM | POA: Diagnosis present

## 2014-12-31 DIAGNOSIS — Z931 Gastrostomy status: Secondary | ICD-10-CM

## 2014-12-31 DIAGNOSIS — R509 Fever, unspecified: Secondary | ICD-10-CM

## 2014-12-31 DIAGNOSIS — L89323 Pressure ulcer of left buttock, stage 3: Secondary | ICD-10-CM | POA: Diagnosis present

## 2014-12-31 DIAGNOSIS — L89312 Pressure ulcer of right buttock, stage 2: Secondary | ICD-10-CM | POA: Diagnosis present

## 2014-12-31 DIAGNOSIS — J45901 Unspecified asthma with (acute) exacerbation: Secondary | ICD-10-CM

## 2014-12-31 DIAGNOSIS — G40309 Generalized idiopathic epilepsy and epileptic syndromes, not intractable, without status epilepticus: Secondary | ICD-10-CM | POA: Diagnosis present

## 2014-12-31 DIAGNOSIS — K219 Gastro-esophageal reflux disease without esophagitis: Secondary | ICD-10-CM | POA: Diagnosis present

## 2014-12-31 DIAGNOSIS — R609 Edema, unspecified: Secondary | ICD-10-CM | POA: Diagnosis not present

## 2014-12-31 DIAGNOSIS — G808 Other cerebral palsy: Secondary | ICD-10-CM | POA: Diagnosis not present

## 2014-12-31 DIAGNOSIS — J9621 Acute and chronic respiratory failure with hypoxia: Secondary | ICD-10-CM | POA: Diagnosis present

## 2014-12-31 DIAGNOSIS — R748 Abnormal levels of other serum enzymes: Secondary | ICD-10-CM | POA: Diagnosis present

## 2014-12-31 DIAGNOSIS — D7589 Other specified diseases of blood and blood-forming organs: Secondary | ICD-10-CM | POA: Diagnosis present

## 2014-12-31 DIAGNOSIS — Z66 Do not resuscitate: Secondary | ICD-10-CM | POA: Diagnosis present

## 2014-12-31 DIAGNOSIS — E46 Unspecified protein-calorie malnutrition: Secondary | ICD-10-CM | POA: Diagnosis present

## 2014-12-31 DIAGNOSIS — M869 Osteomyelitis, unspecified: Secondary | ICD-10-CM | POA: Insufficient documentation

## 2014-12-31 DIAGNOSIS — J690 Pneumonitis due to inhalation of food and vomit: Secondary | ICD-10-CM | POA: Diagnosis present

## 2014-12-31 DIAGNOSIS — R0602 Shortness of breath: Secondary | ICD-10-CM

## 2014-12-31 DIAGNOSIS — L8990 Pressure ulcer of unspecified site, unspecified stage: Secondary | ICD-10-CM | POA: Diagnosis not present

## 2014-12-31 DIAGNOSIS — D638 Anemia in other chronic diseases classified elsewhere: Secondary | ICD-10-CM

## 2014-12-31 DIAGNOSIS — D649 Anemia, unspecified: Secondary | ICD-10-CM | POA: Diagnosis not present

## 2014-12-31 DIAGNOSIS — M8610 Other acute osteomyelitis, unspecified site: Secondary | ICD-10-CM | POA: Diagnosis not present

## 2014-12-31 LAB — URINALYSIS, ROUTINE W REFLEX MICROSCOPIC
Bilirubin Urine: NEGATIVE
GLUCOSE, UA: NEGATIVE mg/dL
Ketones, ur: NEGATIVE mg/dL
Nitrite: NEGATIVE
Protein, ur: >300 mg/dL — AB
SPECIFIC GRAVITY, URINE: 1.031 — AB (ref 1.005–1.030)
pH: 6 (ref 5.0–8.0)

## 2014-12-31 LAB — CBC WITH DIFFERENTIAL/PLATELET
Band Neutrophils: 0 %
Basophils Absolute: 0 10*3/uL (ref 0.0–0.1)
Basophils Relative: 0 %
Blasts: 0 %
EOS PCT: 4 %
Eosinophils Absolute: 0 10*3/uL (ref 0.0–0.7)
HCT: 23.6 % — ABNORMAL LOW (ref 39.0–52.0)
HEMOGLOBIN: 7 g/dL — AB (ref 13.0–17.0)
LYMPHS ABS: 0.6 10*3/uL — AB (ref 0.7–4.0)
LYMPHS PCT: 52 %
MCH: 32.6 pg (ref 26.0–34.0)
MCHC: 29.7 g/dL — AB (ref 30.0–36.0)
MCV: 109.8 fL — ABNORMAL HIGH (ref 78.0–100.0)
MYELOCYTES: 0 %
Metamyelocytes Relative: 0 %
Monocytes Absolute: 0.1 10*3/uL (ref 0.1–1.0)
Monocytes Relative: 5 %
NEUTROS PCT: 39 %
NRBC: 0 /100{WBCs}
Neutro Abs: 0.4 10*3/uL — ABNORMAL LOW (ref 1.7–7.7)
OTHER: 0 %
PLATELETS: 137 10*3/uL — AB (ref 150–400)
Promyelocytes Absolute: 0 %
RBC: 2.15 MIL/uL — AB (ref 4.22–5.81)
RDW: 18.3 % — ABNORMAL HIGH (ref 11.5–15.5)
WBC: 1.1 10*3/uL — AB (ref 4.0–10.5)

## 2014-12-31 LAB — COMPREHENSIVE METABOLIC PANEL
ALT: 34 U/L (ref 17–63)
ANION GAP: 7 (ref 5–15)
AST: 22 U/L (ref 15–41)
Albumin: 2.5 g/dL — ABNORMAL LOW (ref 3.5–5.0)
Alkaline Phosphatase: 124 U/L (ref 38–126)
BUN: 23 mg/dL — ABNORMAL HIGH (ref 6–20)
CHLORIDE: 100 mmol/L — AB (ref 101–111)
CO2: 34 mmol/L — AB (ref 22–32)
CREATININE: 0.51 mg/dL — AB (ref 0.61–1.24)
Calcium: 8.3 mg/dL — ABNORMAL LOW (ref 8.9–10.3)
Glucose, Bld: 96 mg/dL (ref 65–99)
POTASSIUM: 4 mmol/L (ref 3.5–5.1)
SODIUM: 141 mmol/L (ref 135–145)
Total Bilirubin: 0.8 mg/dL (ref 0.3–1.2)
Total Protein: 5.1 g/dL — ABNORMAL LOW (ref 6.5–8.1)

## 2014-12-31 LAB — MRSA PCR SCREENING: MRSA by PCR: NEGATIVE

## 2014-12-31 LAB — I-STAT CG4 LACTIC ACID, ED: LACTIC ACID, VENOUS: 1.97 mmol/L (ref 0.5–2.0)

## 2014-12-31 LAB — URINE MICROSCOPIC-ADD ON

## 2014-12-31 LAB — RETICULOCYTES
RBC.: 2.18 MIL/uL — AB (ref 4.22–5.81)
RETIC COUNT ABSOLUTE: 48 10*3/uL (ref 19.0–186.0)
Retic Ct Pct: 2.2 % (ref 0.4–3.1)

## 2014-12-31 LAB — I-STAT TROPONIN, ED: Troponin i, poc: 0 ng/mL (ref 0.00–0.08)

## 2014-12-31 LAB — PREPARE RBC (CROSSMATCH)

## 2014-12-31 MED ORDER — ACETAMINOPHEN 650 MG RE SUPP
650.0000 mg | Freq: Four times a day (QID) | RECTAL | Status: DC | PRN
Start: 2014-12-31 — End: 2015-01-01
  Filled 2014-12-31: qty 1

## 2014-12-31 MED ORDER — ACETAMINOPHEN 325 MG PO TABS: 650.0000 mg | ORAL_TABLET | Freq: Four times a day (QID) | ORAL | Status: DC | PRN

## 2014-12-31 MED ORDER — NONFORMULARY OR COMPOUNDED ITEM
2.0000 | Freq: Three times a day (TID) | Status: DC
  Administered 2015-01-02 (×2): 2
  Filled 2014-12-31: qty 1

## 2014-12-31 MED ORDER — METHYLPREDNISOLONE SODIUM SUCC 40 MG IJ SOLR
40.0000 mg | Freq: Two times a day (BID) | INTRAMUSCULAR | Status: DC
  Administered 2015-01-01: 40 mg via INTRAVENOUS
  Filled 2014-12-31: qty 1

## 2014-12-31 MED ORDER — ALBUTEROL SULFATE HFA 108 (90 BASE) MCG/ACT IN AERS: 2.0000 | INHALATION_SPRAY | RESPIRATORY_TRACT | Status: DC | PRN

## 2014-12-31 MED ORDER — LEVALBUTEROL HCL 0.63 MG/3ML IN NEBU
0.6300 mg | INHALATION_SOLUTION | Freq: Four times a day (QID) | RESPIRATORY_TRACT | Status: DC | PRN
  Administered 2014-12-31 – 2015-01-06 (×8): 0.63 mg via RESPIRATORY_TRACT
  Filled 2014-12-31 (×8): qty 3

## 2014-12-31 MED ORDER — DIAZEPAM 2.5 MG RE GEL: 12.5000 mg | Freq: Once | RECTAL | Status: DC

## 2014-12-31 MED ORDER — OMEPRAZOLE 2 MG/ML ORAL SUSPENSION
20.0000 mg | Freq: Two times a day (BID) | ORAL | Status: DC
  Administered 2015-01-01 – 2015-01-07 (×13): 20 mg via ORAL
  Filled 2014-12-31 (×15): qty 10

## 2014-12-31 MED ORDER — MEDICATION ORAL SOLUTION/SUSPENSION BUILDER
244.0000 mg | Freq: Two times a day (BID) | ORAL | Status: DC
  Administered 2015-01-01 – 2015-01-07 (×13): 244 mg
  Filled 2014-12-31 (×7): qty 5

## 2014-12-31 MED ORDER — VANCOMYCIN HCL IN DEXTROSE 750-5 MG/150ML-% IV SOLN
750.0000 mg | Freq: Two times a day (BID) | INTRAVENOUS | Status: DC
  Administered 2015-01-01 – 2015-01-02 (×3): 750 mg via INTRAVENOUS
  Filled 2014-12-31 (×4): qty 150

## 2014-12-31 MED ORDER — CLOBAZAM 2.5 MG/ML PO SUSP
2.5000 mg | Freq: Three times a day (TID) | ORAL | Status: DC
  Filled 2014-12-31: qty 4

## 2014-12-31 MED ORDER — METOCLOPRAMIDE HCL 5 MG/5ML PO SOLN
10.0000 mg | Freq: Three times a day (TID) | ORAL | Status: DC
  Administered 2015-01-01 – 2015-01-07 (×21): 10 mg
  Filled 2014-12-31 (×30): qty 10

## 2014-12-31 MED ORDER — NON FORMULARY
244.0000 mg | Freq: Two times a day (BID) | Status: DC
Start: 2014-12-31 — End: 2014-12-31

## 2014-12-31 MED ORDER — FOLIC ACID 1 MG PO TABS
1.0000 mg | ORAL_TABLET | ORAL | Status: DC
  Administered 2015-01-02 – 2015-01-07 (×6): 1 mg
  Filled 2014-12-31 (×6): qty 1

## 2014-12-31 MED ORDER — OVER THE COUNTER MEDICATION: 2.0000 | Freq: Three times a day (TID) | Status: DC

## 2014-12-31 MED ORDER — FILGRASTIM 480 MCG/0.8ML IJ SOSY
480.0000 ug | PREFILLED_SYRINGE | Freq: Once | INTRAMUSCULAR | Status: AC
  Filled 2014-12-31: qty 0.8

## 2014-12-31 MED ORDER — FLUCONAZOLE 40 MG/ML PO SUSR: 200.0000 mg | Freq: Every day | ORAL | Status: DC

## 2014-12-31 MED ORDER — CHOLECALCIFEROL 400 UNIT/ML PO LIQD
1000.0000 [IU] | Freq: Every day | ORAL | Status: DC
  Administered 2015-01-01 – 2015-01-07 (×7): 1000 [IU]
  Filled 2014-12-31 (×7): qty 2.5

## 2014-12-31 MED ORDER — PIPERACILLIN-TAZOBACTAM 3.375 G IVPB
3.3750 g | Freq: Three times a day (TID) | INTRAVENOUS | Status: DC
  Administered 2015-01-01 – 2015-01-03 (×6): 3.375 g via INTRAVENOUS
  Filled 2014-12-31 (×10): qty 50

## 2014-12-31 MED ORDER — SODIUM CHLORIDE 0.9 % IV SOLN: Freq: Once | INTRAVENOUS | Status: DC

## 2014-12-31 MED ORDER — NON FORMULARY: 2.0000 | Freq: Three times a day (TID) | Status: DC

## 2014-12-31 MED ORDER — LEVALBUTEROL HCL 1.25 MG/0.5ML IN NEBU
1.2500 mg | INHALATION_SOLUTION | Freq: Two times a day (BID) | RESPIRATORY_TRACT | Status: DC
  Administered 2015-01-01 – 2015-01-03 (×5): 1.25 mg via RESPIRATORY_TRACT
  Filled 2014-12-31 (×6): qty 0.5

## 2014-12-31 MED ORDER — IPRATROPIUM BROMIDE 0.02 % IN SOLN
0.5000 mg | Freq: Four times a day (QID) | RESPIRATORY_TRACT | Status: DC | PRN
  Administered 2015-01-01 – 2015-01-06 (×5): 0.5 mg via RESPIRATORY_TRACT
  Filled 2014-12-31 (×5): qty 2.5

## 2014-12-31 MED ORDER — SIMETHICONE 80 MG PO CHEW
125.0000 mg | CHEWABLE_TABLET | Freq: Four times a day (QID) | ORAL | Status: DC | PRN
  Administered 2015-01-02: 120 mg via ORAL
  Filled 2014-12-31: qty 2

## 2014-12-31 MED ORDER — ENOXAPARIN SODIUM 40 MG/0.4ML ~~LOC~~ SOLN: 40.0000 mg | SUBCUTANEOUS | Status: DC

## 2014-12-31 MED ORDER — VANCOMYCIN HCL IN DEXTROSE 1-5 GM/200ML-% IV SOLN
1000.0000 mg | Freq: Once | INTRAVENOUS | Status: AC
  Administered 2014-12-31: 1000 mg via INTRAVENOUS
  Filled 2014-12-31: qty 200

## 2014-12-31 MED ORDER — SUCRALFATE 1 GM/10ML PO SUSP
1.0000 g | ORAL | Status: DC
  Administered 2015-01-01 – 2015-01-07 (×20): 1 g
  Filled 2014-12-31 (×20): qty 10

## 2014-12-31 MED ORDER — VITAMIN D3 400 UNIT/ML PO LIQD: 2.5000 mL | Freq: Every day | ORAL | Status: DC

## 2014-12-31 MED ORDER — CLOBAZAM 2.5 MG/ML PO SUSP
1.5000 mg | ORAL | Status: DC
  Filled 2014-12-31: qty 4

## 2014-12-31 MED ORDER — ONDANSETRON HCL 4 MG/2ML IJ SOLN: 4.0000 mg | Freq: Four times a day (QID) | INTRAMUSCULAR | Status: DC | PRN

## 2014-12-31 MED ORDER — CLOBAZAM 2.5 MG/ML PO SUSP: 2.5000 mg | Freq: Three times a day (TID) | ORAL | Status: DC

## 2014-12-31 MED ORDER — SODIUM CHLORIDE 0.9 % IV SOLN: INTRAVENOUS | Status: AC

## 2014-12-31 MED ORDER — CLOBAZAM 2.5 MG/ML PO SUSP
3.7500 mg | Freq: Three times a day (TID) | ORAL | Status: DC
  Administered 2015-01-01 – 2015-01-07 (×18): 3.75 mg
  Filled 2014-12-31 (×17): qty 4

## 2014-12-31 MED ORDER — FUROSEMIDE 10 MG/ML IJ SOLN
20.0000 mg | Freq: Once | INTRAMUSCULAR | Status: DC
  Filled 2014-12-31: qty 2

## 2014-12-31 MED ORDER — ONDANSETRON HCL 4 MG PO TABS
4.0000 mg | ORAL_TABLET | Freq: Four times a day (QID) | ORAL | Status: DC | PRN
Start: 2014-12-31 — End: 2015-01-07

## 2014-12-31 MED ORDER — FILGRASTIM 480 MCG/1.6ML IJ SOLN
480.0000 ug | Freq: Once | INTRAMUSCULAR | Status: AC
  Administered 2015-01-01: 480 ug via SUBCUTANEOUS
  Filled 2014-12-31: qty 1.6

## 2014-12-31 MED ORDER — SODIUM CHLORIDE 0.9 % IJ SOLN
3.0000 mL | Freq: Two times a day (BID) | INTRAMUSCULAR | Status: DC
Start: 2014-12-31 — End: 2015-01-07
  Administered 2014-12-31 – 2015-01-06 (×5): 3 mL via INTRAVENOUS

## 2014-12-31 MED ORDER — LEVALBUTEROL HCL 1.25 MG/0.5ML IN NEBU
1.2500 mg | INHALATION_SOLUTION | RESPIRATORY_TRACT | Status: AC
  Administered 2014-12-31: 1.25 mg via RESPIRATORY_TRACT
  Filled 2014-12-31: qty 0.5

## 2014-12-31 MED ORDER — ENOXAPARIN SODIUM 40 MG/0.4ML ~~LOC~~ SOLN
40.0000 mg | SUBCUTANEOUS | Status: DC
  Administered 2015-01-01: 40 mg via SUBCUTANEOUS
  Filled 2014-12-31 (×4): qty 0.4

## 2014-12-31 MED ORDER — SUCRALFATE 1 GM/10ML PO SUSP
1.0000 g | Freq: Four times a day (QID) | ORAL | Status: DC
Start: 2014-12-31 — End: 2014-12-31

## 2014-12-31 MED ORDER — BACLOFEN 10 MG PO TABS
10.0000 mg | ORAL_TABLET | Freq: Three times a day (TID) | ORAL | Status: DC
  Filled 2014-12-31: qty 1

## 2014-12-31 MED ORDER — PIPERACILLIN-TAZOBACTAM 3.375 G IVPB 30 MIN
3.3750 g | Freq: Once | INTRAVENOUS | Status: AC
  Administered 2014-12-31: 3.375 g via INTRAVENOUS
  Filled 2014-12-31: qty 50

## 2014-12-31 MED ORDER — CLOBAZAM 2.5 MG/ML PO SUSP
2.5000 mg | Freq: Three times a day (TID) | ORAL | Status: DC
  Administered 2014-12-31: 2.5 mg via ORAL
  Filled 2014-12-31: qty 4

## 2014-12-31 MED ORDER — FAMOTIDINE IN NACL 20-0.9 MG/50ML-% IV SOLN
40.0000 mg | Freq: Two times a day (BID) | INTRAVENOUS | Status: DC
Start: 2014-12-31 — End: 2015-01-07
  Administered 2014-12-31 – 2015-01-07 (×5): 40 mg via INTRAVENOUS
  Filled 2014-12-31 (×8): qty 100

## 2014-12-31 MED ORDER — METHYLPREDNISOLONE SODIUM SUCC 125 MG IJ SOLR
80.0000 mg | Freq: Once | INTRAMUSCULAR | Status: DC
  Filled 2014-12-31: qty 2

## 2014-12-31 MED ORDER — LEVOFLOXACIN IN D5W 750 MG/150ML IV SOLN
750.0000 mg | INTRAVENOUS | Status: DC
  Administered 2015-01-01: 750 mg via INTRAVENOUS
  Filled 2014-12-31 (×2): qty 150

## 2014-12-31 MED ORDER — BACLOFEN 10 MG PO TABS: 10.0000 mg | ORAL_TABLET | Freq: Three times a day (TID) | ORAL | Status: DC | PRN

## 2014-12-31 NOTE — Progress Notes (Addendum)
ANTIBIOTIC CONSULT NOTE - INITIAL  Pharmacy Consult for vancomycin + zosyn Indication: rule out sepsis  Allergies  Allergen Reactions  . Depakote [Divalproex Sodium]     Causes pancreatitis   . Vimpat [Lacosamide] Rash  . Keppra [Levetiracetam] Other (See Comments)    Bone marrow suppression  . Adhesive [Tape] Other (See Comments)    Rips skin off (paper tape is ok)  . Neulasta [Pegfilgrastim] Other (See Comments)    Fever, tachycardia    Patient Measurements: Weight: 120 lb (54.432 kg)  Vital Signs: Temp: 99.8 F (37.7 C) (11/29 1515) Temp Source: Temporal (11/29 1515) BP: 113/70 mmHg (11/29 1416) Pulse Rate: 103 (11/29 1416) Intake/Output from previous day:   Intake/Output from this shift:    Labs: No results for input(s): WBC, HGB, PLT, LABCREA, CREATININE in the last 72 hours. CrCl cannot be calculated (Patient has no serum creatinine result on file.). No results for input(s): VANCOTROUGH, VANCOPEAK, VANCORANDOM, GENTTROUGH, GENTPEAK, GENTRANDOM, TOBRATROUGH, TOBRAPEAK, TOBRARND, AMIKACINPEAK, AMIKACINTROU, AMIKACIN in the last 72 hours.   Microbiology: No results found for this or any previous visit (from the past 720 hour(s)).  Medical History: Past Medical History  Diagnosis Date  . CP (cerebral palsy), spastic, quadriplegic ([place])   . Osteoporosis   . Undescended testes   . Seasonal allergies   . IVH (intraventricular hemorrhage) ([place]) 06-02-1990    Grade IV  . Hip dislocation, bilateral ([place])   . Dysphagia   . Retinopathy of prematurity   . 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 Neuromuscular scoliosis   . Osteoporosis   . Complex partial seizures ([place])   . Generalized convulsive epilepsy without mention of intractable epilepsy   . Sinus bradycardia     HR drops to 38-40 while sleeping  . Blister of right heel     fluid filled; origin unknown  . Kidney stones     ?  Marland Kitchen Pneumonia      chronic pneumonia ,respitory failure dx Augest 2014  .  Aspiration pneumonia ([place])     "chronic" (04/12/2014)  . OSA treated with BiPAP     "since age 36"   . Anemia   . 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 GERD (gastroesophageal reflux disease)   . Epilepsy ([place])   . 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 Neutropenia ([place]) 07/03/2014    Assessment: 23 yo m with a h/o cerebral palsy and chronic pancytopenia presenting to the ED on 11/29 with fevers x 1 week. Pharmacy is consulted to dose vancomycin and zosyn for r/o sepsis.  Wbc 1.1, tmax 99.8, SCr 0.51 (0.6 in October 2016), CrCl ~77 ml/min.   Vanc 11/29 >> Zosyn 11/29 >>  11/29 BCx: sent 11/29 UCx: sent  Goal of Therapy:  Vancomycin trough level 15-20 mcg/ml  Plan:  Vancomycin 1,000 mg IV load x 1 Zosyn 3.375 gm IV load x 1 (30 min) Vancomycin 750 mg IV q12h Zosyn 3.375 gm IV q8h (4-hr infusion) De-escalate when clinically feasible Monitor renal fx, cx, cbc, clinical course  Cassie L. Nicole Kindred, PharmD PGY2 Infectious Diseases Pharmacy Resident Pager: [phone] 12/31/2014 3:39 PM   ADDN: Pharmacy is consulted to dose levaquin for HCAP in addition to vanc/zosyn.   Plan: Levaquin 750mg  IV q24h  Andrey Cota. Diona Foley, PharmD, [place] Clinical Pharmacist Pager [phone]

## 2014-12-31 NOTE — H&P (Addendum)
[facility] History and Physical  Boyan Cheever [id number] DOB: 22-Dec-1990 DOA: 12/31/2014  Referring physician: * PCP: Marijean Bravo, MD   Chief Complaint:  . Fever        HPI:  24 y.o. male, with cerebral palsy, spastic quadriplegia, generalized convulsive epilepsy, pancytopenia, obstructive sleep apnea on BiPAP at night, and recurrent aspiration pneumonia (PEG/J tube in place).Patient's history is given by his mother who is also his power of attorney as the patient is nonverbal. She states that he was treated for pneumonia for a total of 7 days and then another 10 days by Dr. Earma Reading with Levaquin, patient has not improved, he continues to wheeze, brought in today because of a fever of 103.5 for about a week. Patient is also neutropenic and requires Neupogen injections on a weekly basis. The patient also had a flu shot last week. Chest x-ray in the ER negative for active pneumonia. Patient was scheduled to received 2 units of packed red blood cells at Dr.Ennever's office by transfusions were held because of the patient's fever  Patient is a GJ-tube was recently replaced on 9/16 and has it replaced every 3 months. Patient also has a Port-A-Cath for the last few years. Patient's mother reports that the patient has been tolerating his BiPAP, he is NPO and his feedings were administered through the GJ-tube. He has not had any diarrhea.   White blood cell count 1.1, hemoglobin 7.0, lactic acid 1.97, troponin negative, UA negative   Review of Systems: negative for the following  Review of Systems  Patient is nonverbal and unable to give a review of systems   Past Medical History  Diagnosis Date  . CP (cerebral palsy), spastic, quadriplegic ([place])   . Osteoporosis   . Undescended testes   . Seasonal allergies   . IVH (intraventricular hemorrhage) ([place]) 04-16-1990    Grade IV  . Hip dislocation, bilateral ([place])   . Dysphagia   . Retinopathy of prematurity   .  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 Neuromuscular scoliosis   . Osteoporosis   . Complex partial seizures ([place])   . Generalized convulsive epilepsy without mention of intractable epilepsy   . Sinus bradycardia     HR drops to 38-40 while sleeping  . Blister of right heel     fluid filled; origin unknown  . Kidney stones     ?  Marland Kitchen Pneumonia      chronic pneumonia ,respitory failure dx Augest 2014  . Aspiration pneumonia ([place])     "chronic" (04/12/2014)  . OSA treated with BiPAP     "since age 15"   . Anemia   . 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 GERD (gastroesophageal reflux disease)   . Epilepsy ([place])   . Spastic quadriplegia ([place])   . Neutropenia ([place]) 07/03/2014     Past Surgical History  Procedure Laterality Date  . Mole removal  "several B/T 2008-2010"    "from all over"  . Jejunostomy feeding tube  03/08/2013; ~ 09/2013; ~ 01/2014    "transgastric-jujunal feeding tube"  . Gastrostomy tube placement  11./02/1998       . Button change  12/15/2010    Procedure: BUTTON CHANGE;  Surgeon: Gatha Mayer, MD;  Location: [facility];  Service: Endoscopy;  Laterality: N/A;  . Peg placement  10/07/2011    Procedure: PERCUTANEOUS ENDOSCOPIC GASTROSTOMY (PEG) REPLACEMENT;  Surgeon: Lafayette Dragon,  MD;  Location: [facility];  Service: Endoscopy;  Laterality: N/A;  Needs 18 F 2.5 button ordered-dl  . Inguinal hernia repair Bilateral 1992  . Retinopathy of prematurity surgery  1992  . Achilles tendon lengthening Bilateral 12/1998  . Tendon release  12/1998    Soft tissue releases  wrists and fingers [Other]  . Infusion pump implantation  07/25/2000; 2013    Intrathecal baclofen pump  . Peg placement N/A 06/05/2012    Procedure: PERCUTANEOUS ENDOSCOPIC GASTROSTOMY (PEG) REPLACEMENT;  Surgeon: Lafayette Dragon, MD;  Location: [facility];  Service: Endoscopy;  Laterality: N/A;  button 8fr.2.5cm  . Strabismus surgery  Bilateral 1993    "3 on right; 2 on left"  . Back surgery  ~ 2008    Harrington Rods in back needs to be log rolled  . Tendon repair Bilateral 09/05/2012    Procedure: LENGTHENING OF DIGITAL FLEXOR TENDONS BILTERAL HANDS;  Surgeon: Jolyn Nap, MD;  Location: [facility];  Service: Orthopedics;  Laterality: Bilateral;  . Peg placement N/A 09/13/2012    Procedure: PERCUTANEOUS ENDOSCOPIC GASTROSTOMY (PEG) REPLACEMENT;  Surgeon: Lafayette Dragon, MD;  Location: [facility];  Service: Endoscopy;  Laterality: N/A;  . Flexible sigmoidoscopy N/A 10/30/2012    Procedure: FLEXIBLE SIGMOIDOSCOPY;  Surgeon: Cleotis Nipper, MD;  Location: [facility];  Service: Endoscopy;  Laterality: N/A;  . Peg placement N/A 11/15/2012    Procedure: PERCUTANEOUS ENDOSCOPIC GASTROSTOMY (PEG) REPLACEMENT;  Surgeon: Jeryl Columbia, MD;  Location: [facility] END[facility]COPY;  Service: Endoscopy;  Laterality: N/A;  . Gastrostomy tube placement  11./02/1998  . Rectal biopsy  10/29/2012    S/P diarrhea from Vancomycin  . Portacath placement Right 11/25/2012    chest  . Eye surgery        Social History:  reports that he has never smoked. He has never used smokeless tobacco. He reports that he does not drink alcohol or use illicit drugs.    Allergies  Allergen Reactions  . Depakote [Divalproex Sodium]     Causes pancreatitis   . Vimpat [Lacosamide] Rash  . Keppra [Levetiracetam] Other (See Comments)    Bone marrow suppression  . Adhesive [Tape] Other (See Comments)    Rips skin off (paper tape is ok)  . Neulasta [Pegfilgrastim] Other (See Comments)    Fever, tachycardia    Family History  Problem Relation Age of Onset  . Adopted: Yes         Prior to Admission medications   Medication Sig Start Date End Date Taking? Authorizing Provider  acetaminophen (TYLENOL) 160 MG/5ML solution 500 mg by Gastric Tube route every 6 (six) hours as needed for moderate pain or fever.     Historical Provider, MD  acetaminophen (TYLENOL) 325  MG suppository Place 650 mg rectally every 6 (six) hours as needed for fever.    Historical Provider, MD  albuterol (PROVENTIL HFA;VENTOLIN HFA) 108 (90 BASE) MCG/ACT inhaler Inhale 2 puffs into the lungs every 4 (four) hours as needed for shortness of breath.    Historical Provider, MD  albuterol (PROVENTIL) (2.5 MG/3ML) 0.083% nebulizer solution Take 2.5 mg by nebulization See admin instructions. Give 1 vial (2.5 mg) twice daily (8am and 8pm) and every 4 hours as needed for shortness of breath or wheezing 10/11/12   Elsie Stain, MD  Amino Acids-Protein Hydrolys (FEEDING SUPPLEMENT, PRO-STAT SUGAR FREE 64,) LIQD Take 30 mLs by mouth 2 (two) times daily. 06/27/14   Velvet Bathe, MD  amoxicillin-clavulanate (AUGMENTIN) 600-42.9 MG/5ML suspension Place  5 mLs (600 mg total) into feeding tube 2 (two) times daily. 10/24/14   Volanda Napoleon, MD  baclofen (GABLOFEN) 40000 MCG/20ML SOLN by Intrathecal route continuous. 385.2 mcg in 24 hours 05/04/12   Jodi Geralds, MD  baclofen (LIORESAL) 10 MG tablet CRUSH AND GIVE 1 TABLET 3 TIMES PER DAY Patient taking differently: CRUSH AND GIVE 1 TABLET 3 TIMES PER DAY ONLY IF PUMP NOT WORKING 09/23/14   Jodi Geralds, MD  bag balm OINT ointment Apply 1 application topically See admin instructions. Apply to sacral area with each diaper change    Historical Provider, MD  calcium carbonate, dosed in mg elemental calcium, 1250 MG/5ML 1,250 mg by Gastric Tube route 3 (three) times daily. 8am , 2pm, and 8pm    Historical Provider, MD  Cholecalciferol (VITAMIN D3) 400 UNIT/ML LIQD Give 2.5 mLs by tube daily. 10/02/14   Historical Provider, MD  DIASTAT ACUDIAL 20 MG GEL Place 12.5 mg rectally as needed (for seizure lasting 2 minutes or longer or repetitive seizures - no more than 1 dose in 12 hours (not given with nasal versed)).  04/01/14   Historical Provider, MD  dicyclomine (BENTYL) 10 MG/5ML syrup 10 mg by Gastric Tube route every 8 (eight) hours as needed (abdominal  cramps). Do not exceed 5 days in one week    Historical Provider, MD  diphenoxylate-atropine (LOMOTIL) 2.5-0.025 MG/5ML liquid 10 mLs by Gastric Tube route every 6 (six) hours as needed for diarrhea or loose stools.     Historical Provider, MD  filgrastim (NEUPOGEN) 300 MCG/ML injection Inject 1 mL (300 mcg total) into the skin once a week. 12/04/14   Eliezer Bottom, NP  fluconazole (DIFLUCAN) 40 MG/ML suspension Place 5 mLs (200 mg total) into feeding tube daily. 10/20/14   Verlee Monte, MD  folic acid (FOLVITE) 1 MG tablet 1 mg by Gastric Tube route daily. 8:00am per G tube    Historical Provider, MD  furosemide (LASIX) 10 MG/ML solution 20 mg by Gastric Tube route daily as needed for fluid or edema.     Historical Provider, MD  gabapentin (NEURONTIN) 250 MG/5ML solution Take 18 mL 3 times daily 12/09/14   Rockwell Germany, NP  Heparin Lock Flush (HEPARIN FLUSH, PORCINE,) 100 UNIT/ML injection 5 mLs (500 Units total) by Intracatheter route as needed (Prior to de-accessing port). 10/25/14   Volanda Napoleon, MD  ibuprofen (ADVIL,MOTRIN) 100 MG/5ML suspension 400 mg by Gastric Tube route every 6 (six) hours as needed for fever (pain).     Historical Provider, MD  levofloxacin (LEVAQUIN) 25 MG/ML solution Take 20 mLs (500 mg total) by mouth daily. 12/23/14   Volanda Napoleon, MD  lidocaine (XYLOCAINE) 5 % ointment APPLY AS DIRECTED 30 minutes PRIOR TO BLOOD DRAWS 09/23/14   Historical Provider, MD  metoCLOPramide (REGLAN) 5 MG/5ML solution Place 10 mLs (10 mg total) into feeding tube 4 (four) times daily -  before meals and at bedtime. 07/26/14   Kelvin Cellar, MD  midazolam (VERSED) 5 MG/ML injection Place 2 mLs (10 mg total) into the nose once. Draw up 96ml in 2 syringes. Remove blue vial access device. Attach syringe to nasal atomizer for intranasal administration. Give 34ml in right nostril x 2 for seizures lasting 2 minutes or longer or for repetitive seizures in a short period of time. Patient taking  differently: Place 10 mg into the nose as needed (for seizure lasting 2 minutes or longer or repetitive seizures - no more than 1 dose  in 24 hours (if unable to give diastat)). Draw up 70ml in 2 syringes. Remove blue vial access device. Attach syringe to nasal atomizer for intranasal administration. Give 75ml in right nostril x 2 for seizures lasting 2 minutes or longer or for repetitive seizures in a short period of time. 11/05/13   Rockwell Germany, NP  Multiple Vitamin (MULTIVITAMIN) LIQD 10 mLs by Gastric Tube route daily.    Historical Provider, MD  Multiple Vitamins-Minerals (ZINC PO) 5 mLs by Gastric Tube route daily. 244 mg / 5 mL zinc solution compounded by [facility]    Historical Provider, MD  mupirocin ointment (BACTROBAN) 2 % Apply 1 application topically as needed (to G-T site). Patient taking differently: Apply 1 application topically 3 (three) times daily as needed (for redness at the G-T site).  09/07/12   Lafayette Dragon, MD  Nutritional Supplements (PROMOD) LIQD Take 30 mLs by mouth 2 (two) times daily. 8am and 5pm    Historical Provider, MD  Nutritional Supplements (TWOCAL HN) LIQD 237 mLs by Gastric Tube route See admin instructions. T.3 can at 46 cc hour x 12 hours - water 172 cc pre and post each and extra 172 bid.    Historical Provider, MD  omeprazole (PRILOSEC) 2 mg/mL SUSP Take 20 mg by mouth 2 (two) times daily.    Historical Provider, MD  OnabotulinumtoxinA (BOTOX IJ) Inject as directed See admin instructions. Every 3 months    Historical Provider, MD  ONFI 2.5 MG/ML solution Give 1.82ml via gastrostomy tube at 8 AM, 2 PM and 8 PM 11/14/14   Rockwell Germany, NP  OXYGEN-HELIUM IN Inhale into the lungs. Oxygen PRN to keep O2 Sat at 90%    Historical Provider, MD  potassium chloride (KLOR-CON) 20 MEQ packet Give 20 meq daily except twice daily with lasix, given through J port Patient taking differently: Take 20 mEq by mouth daily. Give an additional dose when taking lasix 07/26/14    Kelvin Cellar, MD  Pseudoephedrine HCl (SUDAFED CHILDRENS) 15 MG/5ML LIQD 30 mg by Gastric Tube route 3 (three) times daily. 8am, 2pm, 8pm    Historical Provider, MD  sodium chloride 0.9 % injection Flush port after use with 10cc NS. Disp# 10 - 10cc syringes 10/25/14   Volanda Napoleon, MD  sodium phosphate (FLEET) enema Place 1 enema rectally daily as needed (gas buildup/ bloating).     Historical Provider, MD  sucralfate (CARAFATE) 1 GM/10ML suspension 1 g by Gastric Tube route 4 (four) times daily. 7am, 12pm, 5pm, 7pm Administer alone 30 minutes prior to other medications.    Historical Provider, MD  Zinc Oxide (BALMEX EX) Apply 1 application topically See admin instructions. Apply to sacral with each diaper change    Historical Provider, MD     Physical Exam: Filed Vitals:   12/31/14 1445 12/31/14 1500 12/31/14 1515 12/31/14 1645  BP: 113/66 107/67  107/63  Pulse: 97 98  95  Temp:   99.8 F (37.7 C)   TempSrc:   Temporal   Resp: 14 18  20   Weight:      SpO2: 96% 97%  95%   General: Small, non verbal, male, lying in bed with increased work of breathing.  Psych: Patient is at baseline. He is awake, nonverbal, does not respond to commands  Neuro: No F.N deficits, ALL C.Nerves Intact, Strength 5/5 all 4 extremities, Sensation intact all 4 extremities.  ENT: Currently on venti mask, but cough up thick secretions. MMM  Neck: Supple, No  lymphadenopathy appreciated  Respiratory: Mild increased work of breathing, positive for crackles, snoring. No frank wheezing.  Cardiac: Tachycardic, No Murmurs, no LE edema noted, no JVD.   Abdomen: Positive bowel sounds, Soft, Non tender, Non distended, No masses appreciated  Skin: No Cyanosis, Normal Skin Turgor, No Skin Rash or Bruise.  Data Review   Micro Results No results found for this or any previous visit (from the past 240 hour(s)).  Radiology Reports Dg Chest Port 1 View  12/31/2014  CLINICAL DATA:  Chest pain  recent pneumonia persistent fever for 1 week EXAM: PORTABLE CHEST 1 VIEW COMPARISON:  10/19/2014 FINDINGS: Spinal rods noted. Port-A-Cath stable on the right. Low lung volumes with bibasilar atelectasis. Heart size normal. IMPRESSION: Mild bilateral lower lobe atelectasis. No definite evidence of pneumonia. Subtle infiltrate could be due obscured by the bilateral lower lobe atelectasis however. Electronically Signed   By: Skipper Cliche M.D.   On: 12/31/2014 17:39     CBC  Recent Labs Lab 12/31/14 1450  WBC 1.1*  HGB 7.0*  HCT 23.6*  PLT 137*  MCV 109.8*  MCH 32.6  MCHC 29.7*  RDW 18.3*  LYMPHSABS 0.6*  MONOABS 0.1  EOSABS 0.0  BASOSABS 0.0    Chemistries   Recent Labs Lab 12/31/14 1450  NA 141  K 4.0  CL 100*  CO2 34*  GLUCOSE 96  BUN 23*  CREATININE 0.51*  CALCIUM 8.3*  AST 22  ALT 34  ALKPHOS 124  BILITOT 0.8   ------------------------------------------------------------------------------------------------------------------ estimated creatinine clearance is 76.5 mL/min (by C-G formula based on Cr of 0.51). ------------------------------------------------------------------------------------------------------------------ No results for input(s): HGBA1C in the last 72 hours. ------------------------------------------------------------------------------------------------------------------ No results for input(s): CHOL, HDL, LDLCALC, TRIG, CHOLHDL, LDLDIRECT in the last 72 hours. ------------------------------------------------------------------------------------------------------------------ No results for input(s): TSH, T4TOTAL, T3FREE, THYROIDAB in the last 72 hours.  Invalid input(s): FREET3 ------------------------------------------------------------------------------------------------------------------  Recent Labs  12/31/14 1450  RETICCTPCT 2.2    Coagulation profile No results for input(s): INR, PROTIME in the last 168 hours.  No results for  input(s): DDIMER in the last 72 hours.  Cardiac Enzymes No results for input(s): CKMB, TROPONINI, MYOGLOBIN in the last 168 hours.  Invalid input(s): CK ------------------------------------------------------------------------------------------------------------------ Invalid input(s): POCBNP   CBG: No results for input(s): GLUCAP in the last 168 hours.     EKG: Independently reviewed.     Assessment/Plan Principal Problem:   Sepsis ([place]) Active Problems:   Dysphagia requring chronic J tube feeds   Neuromuscular scoliosis   Generalized convulsive epilepsy ([place])   OSA (obstructive sleep apnea)   Cerebral palsy, quadriplegic ([facility])  #1 acute respiratory distress Likely secondary to partially treated HCAP Versus aspiration pneumonia. Patient currently is not septic. Lactic acid normal. Patient started on broad-spectrum antibiotics namely vancomycin/Zosyn/Levaquin. This could also be a viral syndrome and an episode of acute bronchitis. Check a urine Legionella antigen. Check a urine pneumococcus antigen. Check a pro calcitonin level. We'll place on oxygen, BiPAP, mucolytics, scheduled nebulizers,. RT for suctioning. Follow. BiPAP as needed. If worsening may need a repeat CT scan of chest and consultation with pulmonary.  #2 HCAP +/- aspiration pneumonia Patient presenting with respiratory distress, start nebulizer treatments, broad-spectrum antibiotics,  Patient presenting with fevers, productive cough, respiratory distress. Placed empirically on oxygen, mucolytics, scheduled nebulizers,   BiPAP as needed. Repeat chest x-ray in the morning. Tracheal aspirate positive GNR;(ENTEROBACTER AEROGENES), resistant to Zosyn  #3 sepsis likely secondary to probable pneumonia Patient presented in sepsis with a fever with the temples high as 103.5,  respiratory rate as high as 45, tachycardic with a heart rate in the 120s with some increased work of breathing. Lactic acid level was elevated. Check  a pro calcitonin level. Panculture. Management as #1 and #2  #4 dysphagia status post PEG/ J tube placement Patient with known aspiration risk. Patient currently nothing by mouth. Patient on bolus tube feeds at home. Nutrition consultation for resumption of tube feeds.  #5 history of seizures/generalized convulsive epilepsy No recent seizures. Continue Onfi. Midazolam prn.  #6 cerebral palsy/spastic quadriplegic Continue baclofen pump. Patient receiving Botox injections every 3 months per neurology area outpatient follow-up.    #7 history of pancytopenia Been followed by oncology, Dr. Marin Olp in the outpatient setting. Patient ordered to have 2 units packed red blood cells once the fever resolves, hemoglobin 7.0 . Patient gets Neupogen injections once a week. Patient status post neupogen last week.. Platelets within normal limits. Outpatient follow-up.  #9 sinus tachycardia Likely secondary to acute infection. Hydrate IV fluids. Patient has been pancultured. Continue empiric IV vancomycin and IV Zosyn. Follow   Code Status:   DNR/DNI confirmed by patient's POA, his mother Family Communication: bedside Disposition Plan: admit   Total time spent 55 minutes.Greater than 50% of this time was spent in counseling, explanation of diagnosis, planning of further management, and coordination of care  [facility] Pager [phone]  If 7PM-7AM, please contact night-coverage [email or web address] Password [facility] 12/31/2014, 5:55 PM

## 2014-12-31 NOTE — ED Provider Notes (Signed)
Pt will be admitted to tele to [facility] I spoke to dr abrol about this patient She was informed of signed and held orders from hem/onc   Ripley Fraise, MD 12/31/14 [phone]

## 2014-12-31 NOTE — ED Provider Notes (Signed)
Patient seen/examined in the Emergency Department in conjunction with Resident Physician Provider  Patient presents with fever.  Pt with complex medical history including CP, pancytopenia and will develop frequent infections.  Today he presents with fever and AMS.  There is concerning for worsening anemia as well as infection Exam : somnolent but arousable.  He is nonverbal at this time.  He is mildly tachypneic but no hypoxia and no significant tachycardia or hypotension Plan: will need admission.  Cultures ordered.  IV antibiotics ordered Will follow closely     Ripley Fraise, MD 12/31/14 1544

## 2014-12-31 NOTE — ED Provider Notes (Signed)
CSN: [id number]     Arrival date & time 12/31/14  1407 History   First MD Initiated Contact with Patient 12/31/14 1458     Chief Complaint  Patient presents with  . Fever     (Consider location/radiation/quality/duration/timing/severity/associated sxs/prior Treatment) HPI   This is a 24 yo man with cerebral palsy with extensive past medical history and quadriplegia, generalized epilepsy, spasticity, and recurrent pulmonary infections most likely due to aspiration pneumonias, who is here for fevers to 103.5 for about a week. He was started on Levaquin for about few weeks by his PCP. His fever has not resolved despite him being on the levaquin. He had a cxr and ua done yesterday and per patient's father it was normal. History was obtained from the patient's father. He says that he usually gets PRBC transfusions every 3 months or so, but sine he has been running fever, Dr. Marin Olp, pt's hemeonc physician told him that he has to be afebrile for 24 hours prior to receiving the transfusions. He also receives filgrastim weekly as he has chronic pancytopenia.  Patient also gets chronic infections and recurrent sepsis pneumonia.   Patient has multiple potential sources of fever. He has a GJ tube which was recently replaced on 9/16 per chart review. He also has a port-a-cath which has been there for few years and the dressing was changed last week. He also has an intrathecal baclofen pump, and he also has a chronic foley which has been showing clear urine.   We also discussed this with Dr. Jobe Igo, his PCP who agreeded to our plan. - described below.   Past Medical History  Diagnosis Date  . CP (cerebral palsy), spastic, quadriplegic ([place])   . Osteoporosis   . Undescended testes   . Seasonal allergies   . IVH (intraventricular hemorrhage) ([place]) 1990-08-28    Grade IV  . Hip dislocation, bilateral ([employer])   . Dysphagia   . Retinopathy of prematurity   . 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  Neuromuscular scoliosis   . Osteoporosis   . Complex partial seizures ([place])   . Generalized convulsive epilepsy without mention of intractable epilepsy   . Sinus bradycardia     HR drops to 38-40 while sleeping  . Blister of right heel     fluid filled; origin unknown  . Kidney stones     ?  Marland Kitchen Pneumonia      chronic pneumonia ,respitory failure dx Augest 2014  . Aspiration pneumonia ([place])     "chronic" (04/12/2014)  . OSA treated with BiPAP     "since age 87"   . Anemia   . 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 GERD (gastroesophageal reflux disease)   . Epilepsy ([place])   . 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 Neutropenia ([place]) 07/03/2014   Past Surgical History  Procedure Laterality Date  . Mole removal  "several B/T 2008-2010"    "from all over"  . Jejunostomy feeding tube  03/08/2013; ~ 09/2013; ~ 01/2014    "transgastric-jujunal feeding tube"  . Gastrostomy tube placement  11./02/1998       . Button change  12/15/2010    Procedure: BUTTON CHANGE;  Surgeon: Gatha Mayer, MD;  Location: [facility];  Service: Endoscopy;  Laterality: N/A;  . Peg placement  10/07/2011    Procedure: PERCUTANEOUS ENDOSCOPIC GASTROSTOMY (PEG) REPLACEMENT;  Surgeon: Lafayette Dragon, MD;  Location: [facility];  Service: Endoscopy;  Laterality: N/A;  Needs 18 F 2.5 button ordered-dl  . Inguinal hernia repair Bilateral 1992  . Retinopathy of prematurity surgery  1992  . Achilles tendon lengthening Bilateral 12/1998  . Tendon release  12/1998    Soft tissue releases  wrists and fingers [Other]  . Infusion pump implantation  07/25/2000; 2013    Intrathecal baclofen pump  . Peg placement N/A 06/05/2012    Procedure: PERCUTANEOUS ENDOSCOPIC GASTROSTOMY (PEG) REPLACEMENT;  Surgeon: Lafayette Dragon, MD;  Location: [facility];  Service: Endoscopy;  Laterality: N/A;  button 50fr.2.5cm  . Strabismus surgery Bilateral 1993    "3 on right; 2 on left"  . Back  surgery  ~ 2008    Harrington Rods in back needs to be log rolled  . Tendon repair Bilateral 09/05/2012    Procedure: LENGTHENING OF DIGITAL FLEXOR TENDONS BILTERAL HANDS;  Surgeon: Jolyn Nap, MD;  Location: [facility];  Service: Orthopedics;  Laterality: Bilateral;  . Peg placement N/A 09/13/2012    Procedure: PERCUTANEOUS ENDOSCOPIC GASTROSTOMY (PEG) REPLACEMENT;  Surgeon: Lafayette Dragon, MD;  Location: [facility];  Service: Endoscopy;  Laterality: N/A;  . Flexible sigmoidoscopy N/A 10/30/2012    Procedure: FLEXIBLE SIGMOIDOSCOPY;  Surgeon: Cleotis Nipper, MD;  Location: [facility];  Service: Endoscopy;  Laterality: N/A;  . Peg placement N/A 11/15/2012    Procedure: PERCUTANEOUS ENDOSCOPIC GASTROSTOMY (PEG) REPLACEMENT;  Surgeon: Jeryl Columbia, MD;  Location: [facility] END[facility]COPY;  Service: Endoscopy;  Laterality: N/A;  . Gastrostomy tube placement  11./02/1998  . Rectal biopsy  10/29/2012    S/P diarrhea from Vancomycin  . Portacath placement Right 11/25/2012    chest  . Eye surgery     Family History  Problem Relation Age of Onset  . Adopted: Yes   Social History  Substance Use Topics  . Smoking status: Never Smoker   . Smokeless tobacco: Never Used     Comment: never used tobacco  . Alcohol Use: No    Review of Systems  Unable to perform ROS: Mental status change      Allergies  Depakote; Vimpat; Keppra; Adhesive; and Neulasta  Home Medications   Prior to Admission medications   Medication Sig Start Date End Date Taking? Authorizing Provider  acetaminophen (TYLENOL) 160 MG/5ML solution 500 mg by Gastric Tube route every 6 (six) hours as needed for moderate pain or fever.     Historical Provider, MD  acetaminophen (TYLENOL) 325 MG suppository Place 650 mg rectally every 6 (six) hours as needed for fever.    Historical Provider, MD  albuterol (PROVENTIL HFA;VENTOLIN HFA) 108 (90 BASE) MCG/ACT inhaler Inhale 2 puffs into the lungs every 4 (four) hours as needed for shortness of  breath.    Historical Provider, MD  albuterol (PROVENTIL) (2.5 MG/3ML) 0.083% nebulizer solution Take 2.5 mg by nebulization See admin instructions. Give 1 vial (2.5 mg) twice daily (8am and 8pm) and every 4 hours as needed for shortness of breath or wheezing 10/11/12   Elsie Stain, MD  Amino Acids-Protein Hydrolys (FEEDING SUPPLEMENT, PRO-STAT SUGAR FREE 64,) LIQD Take 30 mLs by mouth 2 (two) times daily. 06/27/14   Velvet Bathe, MD  amoxicillin-clavulanate (AUGMENTIN) 600-42.9 MG/5ML suspension Place 5 mLs (600 mg total) into feeding tube 2 (two) times daily. 10/24/14   Volanda Napoleon, MD  baclofen (GABLOFEN) 40000 MCG/20ML SOLN by Intrathecal route continuous. 385.2 mcg in 24 hours 05/04/12   Jodi Geralds, MD  baclofen (LIORESAL) 10 MG tablet  CRUSH AND GIVE 1 TABLET 3 TIMES PER DAY Patient taking differently: CRUSH AND GIVE 1 TABLET 3 TIMES PER DAY ONLY IF PUMP NOT WORKING 09/23/14   Jodi Geralds, MD  bag balm OINT ointment Apply 1 application topically See admin instructions. Apply to sacral area with each diaper change    Historical Provider, MD  calcium carbonate, dosed in mg elemental calcium, 1250 MG/5ML 1,250 mg by Gastric Tube route 3 (three) times daily. 8am , 2pm, and 8pm    Historical Provider, MD  Cholecalciferol (VITAMIN D3) 400 UNIT/ML LIQD Give 2.5 mLs by tube daily. 10/02/14   Historical Provider, MD  DIASTAT ACUDIAL 20 MG GEL Place 12.5 mg rectally as needed (for seizure lasting 2 minutes or longer or repetitive seizures - no more than 1 dose in 12 hours (not given with nasal versed)).  04/01/14   Historical Provider, MD  dicyclomine (BENTYL) 10 MG/5ML syrup 10 mg by Gastric Tube route every 8 (eight) hours as needed (abdominal cramps). Do not exceed 5 days in one week    Historical Provider, MD  diphenoxylate-atropine (LOMOTIL) 2.5-0.025 MG/5ML liquid 10 mLs by Gastric Tube route every 6 (six) hours as needed for diarrhea or loose stools.     Historical Provider, MD   filgrastim (NEUPOGEN) 300 MCG/ML injection Inject 1 mL (300 mcg total) into the skin once a week. 12/04/14   Eliezer Bottom, NP  fluconazole (DIFLUCAN) 40 MG/ML suspension Place 5 mLs (200 mg total) into feeding tube daily. 10/20/14   Verlee Monte, MD  folic acid (FOLVITE) 1 MG tablet 1 mg by Gastric Tube route daily. 8:00am per G tube    Historical Provider, MD  furosemide (LASIX) 10 MG/ML solution 20 mg by Gastric Tube route daily as needed for fluid or edema.     Historical Provider, MD  gabapentin (NEURONTIN) 250 MG/5ML solution Take 18 mL 3 times daily 12/09/14   Rockwell Germany, NP  Heparin Lock Flush (HEPARIN FLUSH, PORCINE,) 100 UNIT/ML injection 5 mLs (500 Units total) by Intracatheter route as needed (Prior to de-accessing port). 10/25/14   Volanda Napoleon, MD  ibuprofen (ADVIL,MOTRIN) 100 MG/5ML suspension 400 mg by Gastric Tube route every 6 (six) hours as needed for fever (pain).     Historical Provider, MD  levofloxacin (LEVAQUIN) 25 MG/ML solution Take 20 mLs (500 mg total) by mouth daily. 12/23/14   Volanda Napoleon, MD  lidocaine (XYLOCAINE) 5 % ointment APPLY AS DIRECTED 30 minutes PRIOR TO BLOOD DRAWS 09/23/14   Historical Provider, MD  metoCLOPramide (REGLAN) 5 MG/5ML solution Place 10 mLs (10 mg total) into feeding tube 4 (four) times daily -  before meals and at bedtime. 07/26/14   Kelvin Cellar, MD  midazolam (VERSED) 5 MG/ML injection Place 2 mLs (10 mg total) into the nose once. Draw up 20ml in 2 syringes. Remove blue vial access device. Attach syringe to nasal atomizer for intranasal administration. Give 19ml in right nostril x 2 for seizures lasting 2 minutes or longer or for repetitive seizures in a short period of time. Patient taking differently: Place 10 mg into the nose as needed (for seizure lasting 2 minutes or longer or repetitive seizures - no more than 1 dose in 24 hours (if unable to give diastat)). Draw up 35ml in 2 syringes. Remove blue vial access device. Attach  syringe to nasal atomizer for intranasal administration. Give 64ml in right nostril x 2 for seizures lasting 2 minutes or longer or for repetitive seizures in a  short period of time. 11/05/13   Rockwell Germany, NP  Multiple Vitamin (MULTIVITAMIN) LIQD 10 mLs by Gastric Tube route daily.    Historical Provider, MD  Multiple Vitamins-Minerals (ZINC PO) 5 mLs by Gastric Tube route daily. 244 mg / 5 mL zinc solution compounded by [facility]    Historical Provider, MD  mupirocin ointment (BACTROBAN) 2 % Apply 1 application topically as needed (to G-T site). Patient taking differently: Apply 1 application topically 3 (three) times daily as needed (for redness at the G-T site).  09/07/12   Lafayette Dragon, MD  Nutritional Supplements (PROMOD) LIQD Take 30 mLs by mouth 2 (two) times daily. 8am and 5pm    Historical Provider, MD  Nutritional Supplements (TWOCAL HN) LIQD 237 mLs by Gastric Tube route See admin instructions. T.3 can at 46 cc hour x 12 hours - water 172 cc pre and post each and extra 172 bid.    Historical Provider, MD  omeprazole (PRILOSEC) 2 mg/mL SUSP Take 20 mg by mouth 2 (two) times daily.    Historical Provider, MD  OnabotulinumtoxinA (BOTOX IJ) Inject as directed See admin instructions. Every 3 months    Historical Provider, MD  ONFI 2.5 MG/ML solution Give 1.33ml via gastrostomy tube at 8 AM, 2 PM and 8 PM 11/14/14   Rockwell Germany, NP  OXYGEN-HELIUM IN Inhale into the lungs. Oxygen PRN to keep O2 Sat at 90%    Historical Provider, MD  potassium chloride (KLOR-CON) 20 MEQ packet Give 20 meq daily except twice daily with lasix, given through J port Patient taking differently: Take 20 mEq by mouth daily. Give an additional dose when taking lasix 07/26/14   Kelvin Cellar, MD  prednisoLONE (PRELONE) 15 MG/5ML syrup PLACE 10 MLS INTO FEEDING TUBE TWICE A DAY X3DAYS,THEN 10 MLS DAILY X3DAYS,THEN 5MLS DAILY X3DAYS 10/08/14   Historical Provider, MD  Pseudoephedrine HCl (SUDAFED CHILDRENS) 15  MG/5ML LIQD 30 mg by Gastric Tube route 3 (three) times daily. 8am, 2pm, 8pm    Historical Provider, MD  simethicone (MYLICON) 0000000 MG chewable tablet 125 mg by Gastric Tube route See admin instructions. 125mg  caplet added to each can of Two Cal 3 times daily    Historical Provider, MD  sodium chloride 0.9 % injection Flush port after use with 10cc NS. Disp# 10 - 10cc syringes 10/25/14   Volanda Napoleon, MD  sodium phosphate (FLEET) enema Place 1 enema rectally daily as needed (gas buildup/ bloating).     Historical Provider, MD  sucralfate (CARAFATE) 1 GM/10ML suspension 1 g by Gastric Tube route 4 (four) times daily. 7am, 12pm, 5pm, 7pm Administer alone 30 minutes prior to other medications.    Historical Provider, MD  Zinc Oxide (BALMEX EX) Apply 1 application topically See admin instructions. Apply to sacral with each diaper change    Historical Provider, MD   BP 113/70 mmHg  Pulse 103  Temp(Src) 99.8 F (37.7 C) (Temporal)  Resp 20  Wt 54.432 kg  SpO2 93% Physical Exam  Constitutional:  Cerebral palsy resulting in quadriplegia, is minimally responsive and has a mask on, history obtained by his father   HENT:  Head: Normocephalic and atraumatic.  Cardiovascular:  Tachycardic to 100s  Pulmonary/Chest: Effort normal and breath sounds normal. No respiratory distress.  Has a portacath, no erythema or warmth around it   Abdominal: Soft. Bowel sounds are normal. He exhibits no distension. There is no tenderness.  Has a GJ tube, which shows some erythema around it  Genitourinary:  Has a chronic foley draining clear urine  Per father, he has developed sacral ulcers but they are healing   Skin: No rash noted.    ED Course  Procedures (including critical care time) Labs Review Labs Reviewed  CULTURE, BLOOD (ROUTINE X 2)  CULTURE, BLOOD (ROUTINE X 2)  URINE CULTURE  COMPREHENSIVE METABOLIC PANEL  CBC WITH DIFFERENTIAL/PLATELET  URINALYSIS, ROUTINE W REFLEX MICROSCOPIC (NOT AT [facility])   I-STAT CG4 LACTIC ACID, ED  I-STAT TROPOININ, ED    Imaging Review No results found. I have personally reviewed and evaluated these images and lab results as part of my medical decision-making.   EKG Interpretation None      MDM   Pt with Cp resulting in quadriplegia with recurrent infections and aspiration pneumonias who is here for 1+ week history of fevers to 103.5. He received rectal tylenol at home before coming here.  He has been on levaquin for 1-2 weeks now, and his fever persists despite that. There could be multiple sources of his fever- which includes GJ tube, portacath, baclofen pump, or the foley, or sacral ulcers . One or more may need to be taken our and/or replaced.   For his pancytopenia- patient's white count has been around 1.1, and his neutrophil % is pending.  We discussed this with Dr. Jobe Igo who said that his white count has been hovering around 1-2 for a while now, and patient receives weekly Filgrastim injections to stimulate his bone marrow. Per the previous chart review, the pancytopenia is probably acquired from chronic use of the anti-seizure medications keppra which he was taking this in the past.   For his anemia, there are pended orders for PRBC transfusion by Dr. Marin Olp, who is his hematology-oncologist but per the patient's father, [name] had wanted to hold on for transfusion until he is afebrile for 24 hours, so this will be managed on the inpatient side. His HgB is around 7, which is not a significant change from prior, and his CBC does show macrocytosis.    Back in [place], his urine culture was growing yeast, so he may need to be on diflucan or other.  We have obtained blood cultures, urine cultures and started him on empiric vancomycin and zosyn and he will be admitted for further management   Final diagnoses:  None    sepsis    Burgess Estelle, MD 12/31/14 1639  Burgess Estelle, MD 12/31/14 1649  Ripley Fraise, MD 01/01/15  IJ:[id number]

## 2014-12-31 NOTE — ED Notes (Signed)
From home via [facility] for fever and pmn, recent treatment for same, decreased LOC, father sts pt needs to be transfused, port accessed by mother pta

## 2014-12-31 NOTE — ED Notes (Signed)
7.5mg  Onfi wasted in sink

## 2015-01-01 ENCOUNTER — Telehealth: Payer: Self-pay | Admitting: *Deleted

## 2015-01-01 ENCOUNTER — Inpatient Hospital Stay (HOSPITAL_COMMUNITY): Payer: Medicaid Other

## 2015-01-01 DIAGNOSIS — L8990 Pressure ulcer of unspecified site, unspecified stage: Secondary | ICD-10-CM | POA: Insufficient documentation

## 2015-01-01 DIAGNOSIS — G40309 Generalized idiopathic epilepsy and epileptic syndromes, not intractable, without status epilepticus: Secondary | ICD-10-CM

## 2015-01-01 DIAGNOSIS — D61818 Other pancytopenia: Secondary | ICD-10-CM

## 2015-01-01 DIAGNOSIS — R1310 Dysphagia, unspecified: Secondary | ICD-10-CM

## 2015-01-01 DIAGNOSIS — G4733 Obstructive sleep apnea (adult) (pediatric): Secondary | ICD-10-CM

## 2015-01-01 DIAGNOSIS — G809 Cerebral palsy, unspecified: Secondary | ICD-10-CM

## 2015-01-01 DIAGNOSIS — A419 Sepsis, unspecified organism: Principal | ICD-10-CM

## 2015-01-01 DIAGNOSIS — R509 Fever, unspecified: Secondary | ICD-10-CM

## 2015-01-01 LAB — URINE CULTURE

## 2015-01-01 LAB — COMPREHENSIVE METABOLIC PANEL
ALBUMIN: 2.3 g/dL — AB (ref 3.5–5.0)
ALT: 28 U/L (ref 17–63)
ANION GAP: 6 (ref 5–15)
AST: 17 U/L (ref 15–41)
Alkaline Phosphatase: 116 U/L (ref 38–126)
BUN: 18 mg/dL (ref 6–20)
CO2: 35 mmol/L — AB (ref 22–32)
Calcium: 8.2 mg/dL — ABNORMAL LOW (ref 8.9–10.3)
Chloride: 99 mmol/L — ABNORMAL LOW (ref 101–111)
Creatinine, Ser: 0.56 mg/dL — ABNORMAL LOW (ref 0.61–1.24)
GFR calc non Af Amer: >60 mL/min (ref >60)
GLUCOSE: 83 mg/dL (ref 65–99)
POTASSIUM: 3.6 mmol/L (ref 3.5–5.1)
SODIUM: 140 mmol/L (ref 135–145)
Total Bilirubin: 0.9 mg/dL (ref 0.3–1.2)
Total Protein: 4.9 g/dL — ABNORMAL LOW (ref 6.5–8.1)

## 2015-01-01 LAB — CBC
HEMATOCRIT: 22.9 % — AB (ref 39.0–52.0)
HEMOGLOBIN: 6.7 g/dL — AB (ref 13.0–17.0)
MCH: 31.8 pg (ref 26.0–34.0)
MCHC: 29.3 g/dL — AB (ref 30.0–36.0)
MCV: 108.5 fL — ABNORMAL HIGH (ref 78.0–100.0)
Platelets: 151 10*3/uL (ref 150–400)
RBC: 2.11 MIL/uL — AB (ref 4.22–5.81)
RDW: 18.4 % — ABNORMAL HIGH (ref 11.5–15.5)
WBC: 1.9 10*3/uL — ABNORMAL LOW (ref 4.0–10.5)

## 2015-01-01 LAB — TSH: TSH: 2.135 u[IU]/mL (ref 0.350–4.500)

## 2015-01-01 LAB — MAGNESIUM: Magnesium: 2.3 mg/dL (ref 1.7–2.4)

## 2015-01-01 LAB — SEDIMENTATION RATE: Sed Rate: 90 mm/hr — ABNORMAL HIGH (ref 0–16)

## 2015-01-01 LAB — PATHOLOGIST SMEAR REVIEW

## 2015-01-01 LAB — C-REACTIVE PROTEIN: CRP: 0.7 mg/dL (ref <1.0)

## 2015-01-01 MED ORDER — IBUPROFEN 100 MG/5ML PO SUSP
600.0000 mg | Freq: Once | ORAL | Status: AC
  Administered 2015-01-01: 600 mg via ORAL
  Filled 2015-01-01: qty 30

## 2015-01-01 MED ORDER — GABAPENTIN 250 MG/5ML PO SOLN
900.0000 mg | Freq: Three times a day (TID) | ORAL | Status: DC
  Administered 2015-01-01 – 2015-01-07 (×16): 900 mg
  Filled 2015-01-01 (×24): qty 18

## 2015-01-01 MED ORDER — GATORADE (BH)
180.0000 mL | ORAL | Status: DC
  Administered 2015-01-01 – 2015-01-07 (×41): 180 mL

## 2015-01-01 MED ORDER — ACETAMINOPHEN 160 MG/5ML PO SOLN
650.0000 mg | Freq: Four times a day (QID) | ORAL | Status: DC | PRN
  Administered 2015-01-01: 650 mg via ORAL
  Filled 2015-01-01: qty 20.3

## 2015-01-01 MED ORDER — DIAZEPAM 2.5 MG RE GEL: 2.5000 mg | Freq: Two times a day (BID) | RECTAL | Status: DC | PRN

## 2015-01-01 MED ORDER — ACETAMINOPHEN 160 MG/5ML PO SOLN
1000.0000 mg | Freq: Once | ORAL | Status: AC
  Administered 2015-01-01: 1000 mg
  Filled 2015-01-01: qty 40.6

## 2015-01-01 MED ORDER — IBUPROFEN 100 MG/5ML PO SUSP
600.0000 mg | Freq: Once | ORAL | Status: AC
  Administered 2015-01-01: 600 mg
  Filled 2015-01-01: qty 30

## 2015-01-01 MED ORDER — FILGRASTIM 480 MCG/1.6ML IJ SOLN
480.0000 ug | Freq: Every day | INTRAMUSCULAR | Status: DC
  Filled 2015-01-01: qty 1.6

## 2015-01-01 MED ORDER — PSEUDOEPHEDRINE HCL 30 MG/5ML PO SYRP
30.0000 mg | ORAL_SOLUTION | ORAL | Status: DC
  Administered 2015-01-01 – 2015-01-03 (×7): 30 mg
  Filled 2015-01-01 (×11): qty 5

## 2015-01-01 MED ORDER — VITAMIN C 500 MG/5ML PO SYRP
300.0000 mg | ORAL_SOLUTION | Freq: Two times a day (BID) | ORAL | Status: DC
  Filled 2015-01-01: qty 3

## 2015-01-01 MED ORDER — PSEUDOEPHEDRINE HCL 15 MG/5ML PO LIQD: 30.0000 mg | Freq: Three times a day (TID) | ORAL | Status: DC

## 2015-01-01 MED ORDER — DIPHENHYDRAMINE HCL 50 MG/ML IJ SOLN
25.0000 mg | Freq: Once | INTRAMUSCULAR | Status: AC
  Administered 2015-01-04: 25 mg via INTRAVENOUS
  Filled 2015-01-01: qty 1

## 2015-01-01 MED ORDER — FUROSEMIDE 10 MG/ML IJ SOLN
20.0000 mg | INTRAMUSCULAR | Status: AC | PRN
  Administered 2015-01-01 – 2015-01-02 (×3): 20 mg via INTRAVENOUS
  Filled 2015-01-01 (×3): qty 2

## 2015-01-01 MED ORDER — CLOBAZAM 2.5 MG/ML PO SUSP
3.7500 mg | Freq: Once | ORAL | Status: AC
  Administered 2015-01-01: 3.75 mg
  Filled 2015-01-01: qty 4

## 2015-01-01 MED ORDER — LIDOCAINE VISCOUS 2 % MT SOLN
OROMUCOSAL | Status: AC
  Filled 2015-01-01: qty 15

## 2015-01-01 MED ORDER — VITAMIN C 500 MG PO TABS
250.0000 mg | ORAL_TABLET | Freq: Three times a day (TID) | ORAL | Status: DC
  Filled 2015-01-01: qty 1

## 2015-01-01 MED ORDER — ACETAMINOPHEN 160 MG/5ML PO SOLN: 500.0000 mg | Freq: Four times a day (QID) | ORAL | Status: DC | PRN

## 2015-01-01 MED ORDER — VITAMIN C 500 MG/5ML PO SYRP
250.0000 mg | ORAL_SOLUTION | Freq: Three times a day (TID) | ORAL | Status: DC
  Administered 2015-01-01 (×2): 250 mg
  Filled 2015-01-01 (×5): qty 2.5

## 2015-01-01 MED ORDER — DIPHENHYDRAMINE HCL 12.5 MG/5ML PO ELIX
25.0000 mg | ORAL_SOLUTION | Freq: Once | ORAL | Status: AC
  Administered 2015-01-01: 25 mg via ORAL
  Filled 2015-01-01: qty 10

## 2015-01-01 MED ORDER — TWOCAL HN PO LIQD
237.0000 mL | Freq: Three times a day (TID) | ORAL | Status: DC
  Administered 2015-01-02 – 2015-01-07 (×7): 237 mL via JEJUNOSTOMY
  Filled 2015-01-01 (×3): qty 237

## 2015-01-01 MED ORDER — MIDAZOLAM 5 MG/ML PEDIATRIC INJ FOR INTRANASAL/SUBLINGUAL USE: 10.0000 mg | INTRAMUSCULAR | Status: DC | PRN

## 2015-01-01 MED ORDER — DIPHENOXYLATE-ATROPINE 2.5-0.025 MG/5ML PO LIQD
10.0000 mL | Freq: Four times a day (QID) | ORAL | Status: DC | PRN
  Administered 2015-01-01 – 2015-01-05 (×7): 10 mL
  Filled 2015-01-01 (×7): qty 10

## 2015-01-01 MED ORDER — PRO-STAT SUGAR FREE PO LIQD
30.0000 mL | Freq: Three times a day (TID) | ORAL | Status: DC
  Administered 2015-01-01 – 2015-01-07 (×14): 30 mL via JEJUNOSTOMY
  Filled 2015-01-01 (×15): qty 30

## 2015-01-01 MED ORDER — ACETAMINOPHEN 650 MG RE SUPP: 650.0000 mg | Freq: Four times a day (QID) | RECTAL | Status: DC | PRN

## 2015-01-01 MED ORDER — IOHEXOL 300 MG/ML  SOLN
50.0000 mL | Freq: Once | INTRAMUSCULAR | Status: AC | PRN
  Administered 2015-01-01: 10 mL via INTRAVENOUS

## 2015-01-01 NOTE — Progress Notes (Signed)
Visit to patients room to place on Bipap for night rest.  Patient is currently being cleaned up by nursing staff.  I will visit patients room again to place him on bipap.

## 2015-01-01 NOTE — Progress Notes (Signed)
Utilization review completed.  

## 2015-01-01 NOTE — Progress Notes (Signed)
Rectal temp is 101.4.  Pt's mother again requests a rectal probe to continuously monitor pt's temps, expressing concern over how his temp will respond when given PRBCs and a neupogen injection, which were ordered to be given this night.  Pt's mother requests to speak to the NP.  Chaney Malling, NP notified of the above request, and spoke to pt's mother on the phone.  No new orders received.  Pt's mother states that the patient "will not be given any blood or neupogen until he has a rectal probe."  She states that she will call the pt's doctor at home to discuss this issue.  Jodell Cipro

## 2015-01-01 NOTE — Consult Note (Signed)
[facility]  Total days of antibiotics 1        Day 1 vanco        Day 1 piptazo         Reason for Consult: febrile neutropenia and osteo    Referring Physician: ghimire  Principal Problem:   Sepsis ([place]) Active Problems:   Dysphagia requring chronic J tube feeds   Neuromuscular scoliosis   Generalized convulsive epilepsy (HCC)   OSA (obstructive sleep apnea)   Cerebral palsy, quadriplegic (HCC)   Pressure ulcer    HPI: Jeffery [name] is a 24 y.o. male  Cerebral palsy, and spastic quadriplegia. "Jeffery [name]" is well cared for at home by his parents, including his mother who is a Marine scientist. In the last week, he has been having persistently high fevers of 102-103F. He was seen by his PCP given a course of levofloxacin for pneumonia. Due to still having a fever after 5 day course, he was given 2nd run. He would need increasing lengths of time on his bipap due to increase work of breathing. His father reported that his blood work revealed that he needed blood transfusion and neupogen but was deferred to come to cancer center due to his fever. They were instructed to come to the ED for further evaluation. His temp has been as high as 101.9 on admit, he suffers from pancytopenia. WBC of 1.9, ANC .04 hgb 7. Parents were keeping him hydrated with gatorade including tube feeds  He has had blood cx drawn, urine cx due to ua showing pyuria. He has pressure ulcers to left and right buttock that are slowly healing at stage 3 on L and stage 2 on the R. 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 In which he has not been previously treated for per his father.  He was seen by [name]  Past Medical History  Diagnosis Date  . CP (cerebral palsy), spastic, quadriplegic ([place])   . Osteoporosis   . Undescended testes   . Seasonal allergies   . IVH (intraventricular hemorrhage) ([place]) 04-04-1990    Grade IV  . Hip dislocation, bilateral ([place])   . Dysphagia   .  Retinopathy of prematurity   . 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 Neuromuscular scoliosis   . Osteoporosis   . Complex partial seizures ([place])   . Generalized convulsive epilepsy without mention of intractable epilepsy   . Sinus bradycardia     HR drops to 38-40 while sleeping  . Blister of right heel     fluid filled; origin unknown  . Kidney stones     ?  Marland Kitchen Pneumonia      chronic pneumonia ,respitory failure dx Augest 2014  . Aspiration pneumonia ([place])     "chronic" (04/12/2014)  . OSA treated with BiPAP     "since age 60"   . Anemia   . 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 GERD (gastroesophageal reflux disease)   . Epilepsy ([place])   . 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 Neutropenia ([place]) 07/03/2014    Allergies:  Allergies  Allergen Reactions  . Depakote [Divalproex Sodium]     Causes pancreatitis   . Vimpat [Lacosamide] Rash  . Keppra [Levetiracetam] Other (See Comments)    Bone marrow suppression  . Adhesive [Tape] Other (See Comments)    Rips skin off (paper tape is ok)  .  Neulasta [Pegfilgrastim] Other (See Comments)    Fever, tachycardia    MEDICATIONS: . sodium chloride   Intravenous Once  . ascorbic acid  250 mg Per Tube TID  . Bean Aid  2 capsule Per Tube TID WC  . cholecalciferol  1,000 Units Per Tube Daily  . cloBAZam  3.75 mg Per Tube TID  . diazepam  12.5 mg Rectal Once  . diphenhydrAMINE  25 mg Intravenous Once  . enoxaparin (LOVENOX) injection  40 mg Subcutaneous Q24H  . famotidine  40 mg Intravenous Q12H  . feeding supplement (PRO-STAT SUGAR FREE 64)  30 mL Per J Tube TID  . [START ON 01/02/2015] filgrastim  480 mcg Subcutaneous Daily  . folic acid  1 mg Per Tube Q24H  . furosemide  20 mg Intravenous Once  . gatorade (BH)  180 mL Per Tube 8 times per day  . levalbuterol  1.25 mg Nebulization BID  . levofloxacin (LEVAQUIN) IV  750 mg Intravenous Q24H  . lidocaine      .  methylPREDNISolone (SOLU-MEDROL) injection  80 mg Intravenous Once  . metoCLOPramide  10 mg Per Tube TID AC & HS  . omeprazole  20 mg Oral BID  . piperacillin-tazobactam (ZOSYN)  IV  3.375 g Intravenous Q8H  . pseudoephedrine  30 mg Per Tube 3 times per day  . sodium chloride  3 mL Intravenous Q12H  . sucralfate  1 g Per Tube 4 times per day  . TWOCAL HN  237 mL Per J Tube TID  . vancomycin  750 mg Intravenous Q12H  . Zinc 258m/5ml  244 mg Per Tube BID    Social History  Substance Use Topics  . Smoking status: Never Smoker   . Smokeless tobacco: Never Used     Comment: never used tobacco  . Alcohol Use: No    Family History  Problem Relation Age of Onset  . Adopted: Yes    Review of Systems : per his family since patient has AMS  Review of Systems  Constitutional: +for fever, chills, diaphoresis,  HENT: Negative for congestion, sore throat, rhinorrhea, sneezing, trouble swallowing and sinus pressure.  Eyes: Negative for photophobia and visual disturbance.  Respiratory: Negative for cough, chest tightness, shortness of breath, wheezing and stridor.  Cardiovascular: Negative for chest pain, palpitations and leg swelling.  Gastrointestinal: Negative for nausea, vomiting, abdominal pain, diarrhea, constipation, blood in stool, abdominal distention and anal bleeding.  Genitourinary: Negative for dysuria, hematuria, flank pain and difficulty urinating.  Musculoskeletal: Negative for myalgias, back pain, joint swelling, arthralgias and gait problem.  Skin: N+ wounds but no worsening drainage Neurological: Negative for dizziness, tremors, weakness and light-headedness.  Hematological: Negative for adenopathy. Does not bruise/bleed easily.  Psychiatric/Behavioral: Negative for behavioral problems, confusion, sleep disturbance, dysphoric mood, decreased concentration and agitation.      OBJECTIVE: Temp:  [98.3 F (36.8 C)-101.7 F (38.7 C)] 99.3 F (37.4 C) (11/30 1459) Pulse  Rate:  [87-120] 95 (11/30 1443) Resp:  [13-33] 13 (11/30 1459) BP: (98-115)/(53-80) 107/64 mmHg (11/30 1459) SpO2:  [96 %-100 %] 96 % (11/30 1459) Weight:  [126 lb 4.8 oz (57.289 kg)-127 lb 12.8 oz (57.97 kg)] 126 lb 4.8 oz (57.289 kg) (11/30 0419) Physical Exam  Constitutional: He is oriented to person,He appears chronically ill. Pale complexion HENT: wearing oxygen. Mouth/Throat: Oropharynx is clear and moist. No oropharyngeal exudate.  Cardiovascular:tachycardic,, regular rhythm and normal heart sounds. Exam reveals no gallop and no friction rub.  No murmur heard.  Chest  wall: port a cath no erythema Pulmonary/Chest: + acc. Muscle use. Effort normal and breath sounds normal. No respiratory distress. Exp wheeze at right base Abdominal: Soft. Bowel sounds are normal. He exhibits no distension. There is no tenderness. Peg-J in place  Neurological: He has slight movement of right arm, but left arm contracted. Muscle wasting to LE. Flaccid paralysis  Skin: Skin is warm and dry. No rash noted. No erythema. Unable to visualize wounds since father asked not to move him while receiving blood. Psychiatric: He appears calm   LABS: Results for orders placed or performed during the hospital encounter of 12/31/14 (from the past 48 hour(s))  Comprehensive metabolic panel     Status: Abnormal   Collection Time: 12/31/14  2:50 PM  Result Value Ref Range   Sodium 141 135 - 145 mmol/L   Potassium 4.0 3.5 - 5.1 mmol/L   Chloride 100 (L) 101 - 111 mmol/L   CO2 34 (H) 22 - 32 mmol/L   Glucose, Bld 96 65 - 99 mg/dL   BUN 23 (H) 6 - 20 mg/dL   Creatinine, Ser 0.51 (L) 0.61 - 1.24 mg/dL   Calcium 8.3 (L) 8.9 - 10.3 mg/dL   Total Protein 5.1 (L) 6.5 - 8.1 g/dL   Albumin 2.5 (L) 3.5 - 5.0 g/dL   AST 22 15 - 41 U/L   ALT 34 17 - 63 U/L   Alkaline Phosphatase 124 38 - 126 U/L   Total Bilirubin 0.8 0.3 - 1.2 mg/dL   GFR calc non Af Amer >60 >60 mL/min   GFR calc Af Amer >60 >60 mL/min    Comment:  (NOTE) The eGFR has been calculated using the CKD EPI equation. This calculation has not been validated in all clinical situations. eGFR's persistently <60 mL/min signify possible Chronic Kidney Disease.    Anion gap 7 5 - 15  CBC WITH DIFFERENTIAL     Status: Abnormal   Collection Time: 12/31/14  2:50 PM  Result Value Ref Range   WBC 1.1 (LL) 4.0 - 10.5 K/uL    Comment: REPEATED TO VERIFY CRITICAL RESULT CALLED TO, READ BACK BY AND VERIFIED WITH: BEASLEY,M RN [id number] 11.29.16 MCADOO,G    RBC 2.15 (L) 4.22 - 5.81 MIL/uL   Hemoglobin 7.0 (L) 13.0 - 17.0 g/dL   HCT 23.6 (L) 39.0 - 52.0 %   MCV 109.8 (H) 78.0 - 100.0 fL   MCH 32.6 26.0 - 34.0 pg   MCHC 29.7 (L) 30.0 - 36.0 g/dL   RDW 18.3 (H) 11.5 - 15.5 %   Platelets 137 (L) 150 - 400 K/uL   Neutrophils Relative % 39 %   Lymphocytes Relative 52 %   Monocytes Relative 5 %   Eosinophils Relative 4 %   Basophils Relative 0 %   Band Neutrophils 0 %   Metamyelocytes Relative 0 %   Myelocytes 0 %   Promyelocytes Absolute 0 %   Blasts 0 %   nRBC 0 0 /100 WBC   Other 0 %   Neutro Abs 0.4 (L) 1.7 - 7.7 K/uL   Lymphs Abs 0.6 (L) 0.7 - 4.0 K/uL   Monocytes Absolute 0.1 0.1 - 1.0 K/uL   Eosinophils Absolute 0.0 0.0 - 0.7 K/uL   Basophils Absolute 0.0 0.0 - 0.1 K/uL   RBC Morphology POLYCHROMASIA PRESENT     Comment: TARGET CELLS TEARDROP CELLS   Reticulocytes     Status: Abnormal   Collection Time: 12/31/14  2:50 PM  Result Value  Ref Range   Retic Ct Pct 2.2 0.4 - 3.1 %   RBC. 2.18 (L) 4.22 - 5.81 MIL/uL   Retic Count, Manual 48.0 19.0 - 186.0 K/uL  Pathologist smear review     Status: None   Collection Time: 12/31/14  2:50 PM  Result Value Ref Range   Path Review Leukopenia with neutropenia.     Comment: Macrocytic anemia with polychromasia and rare tear drop cells. Mild thrombocytopenia. Reviewed by Kalman Drape, M.D. 01/01/15.   Blood Culture (routine x 2)     Status: None (Preliminary result)   Collection Time:  12/31/14  2:51 PM  Result Value Ref Range   Specimen Description BLOOD    Special Requests BOTTLES DRAWN AEROBIC AND ANAEROBIC 6CC PORT    Culture NO GROWTH < 24 HOURS    Report Status PENDING   Urinalysis, Routine w reflex microscopic (not at [facility])     Status: Abnormal   Collection Time: 12/31/14  2:54 PM  Result Value Ref Range   Color, Urine AMBER (A) YELLOW    Comment: BIOCHEMICALS MAY BE AFFECTED BY COLOR   APPearance CLOUDY (A) CLEAR   Specific Gravity, Urine 1.031 (H) 1.005 - 1.030   pH 6.0 5.0 - 8.0   Glucose, UA NEGATIVE NEGATIVE mg/dL   Hgb urine dipstick TRACE (A) NEGATIVE   Bilirubin Urine NEGATIVE NEGATIVE   Ketones, ur NEGATIVE NEGATIVE mg/dL   Protein, ur >300 (A) NEGATIVE mg/dL   Nitrite NEGATIVE NEGATIVE   Leukocytes, UA TRACE (A) NEGATIVE  Urine culture     Status: None   Collection Time: 12/31/14  2:54 PM  Result Value Ref Range   Specimen Description URINE, CATHETERIZED    Special Requests NONE    Culture MULTIPLE SPECIES PRESENT, SUGGEST RECOLLECTION    Report Status 01/01/2015 FINAL   Urine microscopic-add on     Status: Abnormal   Collection Time: 12/31/14  2:54 PM  Result Value Ref Range   Squamous Epithelial / LPF 0-5 (A) NONE SEEN   WBC, UA 6-30 0 - 5 WBC/hpf   RBC / HPF 0-5 0 - 5 RBC/hpf   Bacteria, UA FEW (A) NONE SEEN   Casts HYALINE CASTS (A) NEGATIVE    Comment: GRANULAR CAST  I-stat troponin, ED (not at [facility])     Status: None   Collection Time: 12/31/14  3:02 PM  Result Value Ref Range   Troponin i, poc 0.00 0.00 - 0.08 ng/mL   Comment 3            Comment: Due to the release kinetics of cTnI, a negative result within the first hours of the onset of symptoms does not rule out myocardial infarction with certainty. If myocardial infarction is still suspected, repeat the test at appropriate intervals.   I-Stat CG4 Lactic Acid, ED  (not at  [facility])     Status: None   Collection Time: 12/31/14  3:04 PM  Result Value Ref Range   Lactic  Acid, Venous 1.97 0.5 - 2.0 mmol/L  Blood Culture (routine x 2)     Status: None (Preliminary result)   Collection Time: 12/31/14  3:50 PM  Result Value Ref Range   Specimen Description BLOOD LEFT HAND    Special Requests BOTTLES DRAWN AEROBIC AND ANAEROBIC 5CC    Culture NO GROWTH < 24 HOURS    Report Status PENDING   Type and screen     Status: None (Preliminary result)   Collection Time: 12/31/14  6:10  PM  Result Value Ref Range   ABO/RH(D) O POS    Antibody Screen NEG    Sample Expiration 01/03/2015    Unit Number [id number]    Blood Component Type RED CELLS,LR    Unit division 00    Status of Unit ISSUED    Transfusion Status OK TO TRANSFUSE    Crossmatch Result Compatible    Unit Number [id number]    Blood Component Type RBC LR PHER2    Unit division 00    Status of Unit ALLOCATED    Transfusion Status OK TO TRANSFUSE    Crossmatch Result Compatible   Prepare RBC     Status: None   Collection Time: 12/31/14  6:10 PM  Result Value Ref Range   Order Confirmation ORDER PROCESSED BY BLOOD BANK   MRSA PCR Screening     Status: None   Collection Time: 12/31/14  7:17 PM  Result Value Ref Range   MRSA by PCR NEGATIVE NEGATIVE    Comment:        The GeneXpert MRSA Assay (FDA approved for NASAL specimens only), is one component of a comprehensive MRSA colonization surveillance program. It is not intended to diagnose MRSA infection nor to guide or monitor treatment for MRSA infections.   Magnesium     Status: None   Collection Time: 12/31/14 11:30 PM  Result Value Ref Range   Magnesium 2.3 1.7 - 2.4 mg/dL  TSH     Status: None   Collection Time: 12/31/14 11:30 PM  Result Value Ref Range   TSH 2.135 0.350 - 4.500 uIU/mL  Comprehensive metabolic panel     Status: Abnormal   Collection Time: 01/01/15  6:30 AM  Result Value Ref Range   Sodium 140 135 - 145 mmol/L   Potassium 3.6 3.5 - 5.1 mmol/L   Chloride 99 (L) 101 - 111 mmol/L   CO2 35 (H) 22 - 32 mmol/L    Glucose, Bld 83 65 - 99 mg/dL   BUN 18 6 - 20 mg/dL   Creatinine, Ser 0.56 (L) 0.61 - 1.24 mg/dL   Calcium 8.2 (L) 8.9 - 10.3 mg/dL   Total Protein 4.9 (L) 6.5 - 8.1 g/dL   Albumin 2.3 (L) 3.5 - 5.0 g/dL   AST 17 15 - 41 U/L   ALT 28 17 - 63 U/L   Alkaline Phosphatase 116 38 - 126 U/L   Total Bilirubin 0.9 0.3 - 1.2 mg/dL   GFR calc non Af Amer >60 >60 mL/min   GFR calc Af Amer >60 >60 mL/min    Comment: (NOTE) The eGFR has been calculated using the CKD EPI equation. This calculation has not been validated in all clinical situations. eGFR's persistently <60 mL/min signify possible Chronic Kidney Disease.    Anion gap 6 5 - 15  CBC     Status: Abnormal   Collection Time: 01/01/15  6:30 AM  Result Value Ref Range   WBC 1.9 (L) 4.0 - 10.5 K/uL   RBC 2.11 (L) 4.22 - 5.81 MIL/uL   Hemoglobin 6.7 (LL) 13.0 - 17.0 g/dL    Comment: REPEATED TO VERIFY CRITICAL RESULT CALLED TO, READ BACK BY AND VERIFIED WITH: R.WOFFORD,RN 0802 01/01/15 CLARK,S    HCT 22.9 (L) 39.0 - 52.0 %   MCV 108.5 (H) 78.0 - 100.0 fL   MCH 31.8 26.0 - 34.0 pg   MCHC 29.3 (L) 30.0 - 36.0 g/dL   RDW 18.4 (H) 11.5 - 15.5 %   Platelets  151 150 - 400 K/uL  Sedimentation rate     Status: Abnormal   Collection Time: 01/01/15  1:07 PM  Result Value Ref Range   Sed Rate 90 (H) 0 - 16 mm/hr  C-reactive protein     Status: None   Collection Time: 01/01/15  1:07 PM  Result Value Ref Range   CRP 0.7 <1.0 mg/dL    MICRO:  IMAGING: Ct Pelvis Wo Contrast  01/01/2015  CLINICAL DATA:  Cerebral palsy.  Clinical concern for osteomyelitis. EXAM: CT PELVIS WITHOUT CONTRAST TECHNIQUE: Multidetector CT imaging of the pelvis was performed following the standard protocol without intravenous contrast. COMPARISON:  10/15/2014 CT abdomen/pelvis. FINDINGS: Reproductive: Normal prostate and seminal vesicles. Bladder: Collapsed and grossly normal urinary bladder. Visualized ureters are normal caliber. Bowel: Visualized small and large  bowel are normal caliber with no bowel wall thickening. Oral contrast traverses to the distal rectum. Partially visualized is an enteric tube in the distal duodenum and proximal jejunum, with the tip not seen on this study. Vascular/Lymphatic: No pathologically enlarged lymph nodes in the pelvis. Other: No pneumoperitoneum, ascites or focal fluid collection. Musculoskeletal: There is curvilinear gas, fat stranding and ill-defined fluid in the posterior medial left gluteal subcutaneous soft tissues (series 4/ image 125), suggestive of an incompletely visualized decubitus ulcer, with extension of the fat stranding and fluid to the inferior margin of the left ischium. There is mild focal cortical erosion at the inferior left ischium with associated periosteal reaction, in keeping with osteomyelitis. No presacral fluid or fat stranding. No evidence of a sacral decubitus ulcer. Subcutaneous ventral right lower quadrant spinal stimulator device is noted with lead coursing posteriorly and entering the lumbar spinal canal, with the tip not visualized on this study. No aggressive appearing focal osseous lesions. 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 There is stable diffuse osteopenia. No osseous fracture. No additional focal cortical erosions or periosteal reaction. Partially visualized is bilateral posterior spinal fusion hardware with posterior bilateral fusion rods and interlocking screws extending into the bilateral iliac wings, with no evidence of hardware fracture or loosening. IMPRESSION: 1. Osteomyelitis of the inferior left ischium associated with curvilinear fat stranding, fluid and gas in the posteromedial left gluteal subcutaneous soft tissues, presumably due to a medial left gluteal decubitus ulcer (which is incompletely visualized on this study).  No focal drainable fluid collection. 2. No pelvic ascites or intraperitoneal fluid collection. No presacral fluid or inflammatory change. 3. 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 Electronically Signed   By: Ilona Sorrel M.D.   On: 01/01/2015 10:34   Ir Replc Duoden/jejuno Tube Percut W/fluoro  01/01/2015  CLINICAL DATA:  Routine jejunostomy tube exchange EXAM: IR REPLACE DUODEN/JEJUNO TUBE PERCUT WITH FLOURO FLUOROSCOPY TIME:  12 seconds MEDICATIONS AND MEDICAL HISTORY: None ANESTHESIA/SEDATION: None CONTRAST:  5 cc Omnipaque 300 PROCEDURE: The procedure, risks, benefits, and alternatives were explained to the patient. Questions regarding the procedure were encouraged and answered. The patient understands and consents to the procedure. The abdomen was prepped with Betadine in a sterile fashion, and a sterile drape was applied covering the operative field. A sterile gown and sterile gloves were used for the procedure. The existing gastrojejunostomy tube was removed over a stiff glidewire and exchange for a new make jejunostomy tube. The tip was positioned in the proximal jejunum. Contrast was injected. 10 cc saline was utilized to insufflate the  balloon. FINDINGS: The jejunostomy tube is been exchange. Tip is in the proximal jejunum. COMPLICATIONS: None IMPRESSION: Successful jejunostomy tube exchange. Electronically Signed   By: Marybelle Killings M.D.   On: 01/01/2015 12:18   Portable Chest 1 View  01/01/2015  CLINICAL DATA:  Shortness of breath, cough, dyspnea.  Cerebral palsy EXAM: PORTABLE CHEST 1 VIEW COMPARISON:  Yesterday FINDINGS: Porta catheter on the right with tip in good position at the SVC level. Persistent hypoventilation and linear opacities at the bases. There is no edema, consolidation, effusion, or pneumothorax. Normal heart size and mediastinal contours. Extensive spinal fixation. IMPRESSION: Chronic hypoventilation with basilar atelectasis or scarring.  Electronically Signed   By: Monte Fantasia M.D.   On: 01/01/2015 07:40   Dg Chest Port 1 View  12/31/2014  CLINICAL DATA:  Chest pain recent pneumonia persistent fever for 1 week EXAM: PORTABLE CHEST 1 VIEW COMPARISON:  10/19/2014 FINDINGS: Spinal rods noted. Port-A-Cath stable on the right. Low lung volumes with bibasilar atelectasis. Heart size normal. IMPRESSION: Mild bilateral lower lobe atelectasis. No definite evidence of pneumonia. Subtle infiltrate could be due obscured by the bilateral lower lobe atelectasis however. Electronically Signed   By: Skipper Cliche M.D.   On: 12/31/2014 17:39    HISTORICAL MICRO/IMAGING  Assessment/Plan:  FUO in young patient with neutropenia with possible sources include port-a-cath, new ischial osteo  - continue on vancomycin and piptazo for osteomyeltis - would like to follow up on blood cx to see if bacteremia was the cause of his fevers - anemia = getting rbc tsf - neutropenia = neupogen - will add sed rate adn crp \ Dr. Johnnye Sima to see tomorrow  Caren Griffins B. [place] [facility] [phone]

## 2015-01-01 NOTE — Progress Notes (Signed)
Initial Nutrition Assessment  DOCUMENTATION CODES:   Not applicable  INTERVENTION:    Resume home TF's: 3 cans TwoCal HN (mom brings from home) via J-tube per day. Gatorade flushes (mom brings from home) 180 ml 8 times per day (before and after each can of TF is given plus 2 additional times per day). Prostat 30 ml TID via J-tube. TF regimen will provide 1725 kcals, 105 gm protein per day.  NUTRITION DIAGNOSIS:   Inadequate oral intake related to chronic illness as evidenced by per patient/family report.  GOAL:   Patient will meet greater than or equal to 90% of their needs  MONITOR:   TF tolerance, Skin, Weight trends, Labs  REASON FOR ASSESSMENT:   Consult Enteral/tube feeding initiation and management  ASSESSMENT:   24 y.o. male, with cerebral palsy, spastic quadriplegia, generalized convulsive epilepsy, pancytopenia, obstructive sleep apnea on BiPAP at night, and recurrent aspiration pneumonia (PEG/J tube in place).Patient's history is given by his mother who is also his power of attorney as the patient is nonverbal. She states that he was treated for pneumonia for a total of 7 days and then another 10 days by Dr. Earma Reading with Levaquin, patient has not improved, he continues to wheeze, brought in today because of a fever of 103.5 for about a week. Patient is also neutropenic and requires Neupogen injections on a weekly basis. The patient also had a flu shot last week. Chest x-ray in the ER negative for active pneumonia.  Mom reports that she has been giving patient more TF and more Protein supplements because of his stage 3 wound. Current regimen is 3 cans of TwoCal HN per day via J-tube with Prostat 40 ml BID. Rate of TF varies depending on whether he is in the bed or sitting up. She uses Gatorade to flush the tube instead of water, 180 ml 8 times per day. GJ tube was exchanged this AM.   Diet Order:  Diet Heart Room service appropriate?: Yes; Fluid consistency:: Thin  Skin:   Wound (see comment) (stage II to buttocks; stage III to buttocks)  Last BM:  unknown  Height:   Ht Readings from Last 1 Encounters:  12/31/14 4' 11.5" (1.511 m)    Weight:   Wt Readings from Last 1 Encounters:  01/01/15 126 lb 4.8 oz (57.289 kg)    Ideal Body Weight:   N/A  BMI:  Body mass index is 25.09 kg/(m^2).  Estimated Nutritional Needs:   Kcal:  1400-1600  Protein:  75-95 gm  Fluid:  >/= 1.5 L  EDUCATION NEEDS:   No education needs identified at this time  Molli Barrows, Anon Raices, [place], [place] Pager [phone] After Hours Pager [phone]

## 2015-01-01 NOTE — Progress Notes (Addendum)
PATIENT DETAILS Name: Jeffery [name] Age: 24 y.o. Sex: male Date of Birth: [birth year] Admit Date: 12/31/2014 Admitting Physician Reyne Dumas, MD PQA:ESLPNP, DAVID C, MD  Subjective: Sleeping comfortably.Aroused briefly when I was examining him.   Assessment/Plan: Principal Problem: Sepsis: Although could have aspiration pneumonia, likely secondary to osteomyelitis. 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 Not sure if patient can tolerate debridement/aggressive care-await wound care input-have consulted infectious disease. Suspect best managed with IV antibiotics-and not aggressive wound care/debridement (that would require sedation/anesthesia). Check ESR/CRP. Will discuss with the patient's mother at bedside. Follow cultures.  Addendum: mother not at bedside-but patients father was updated regarding dx of Osteo-I have recommended that we manage this as much as possible with IV Abx and not with aggressive surgical debridement requiring sedation or gen anesthesia-father was agreeable.Will update mother when she is available. Father did confirm DNR status.   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  Acute hypoxic respiratory failure: Improved-suspect secondary to atelectasis/aspiration pneumonia. On chronic BiPAP-taper off oxygen.  ? Aspiration pneumonitis: Current antibiotics should easily cover. But suspect source of fever likely secondary to pelvic osteomyelitis  Anemia/Leukopenia: Chronic issue-attributed to bone marrow suppression from Keppra and other medications. Received Neupogen early this morning, received 2 units of PRBC today. Follows with oncology/hematology in the outpatient  Dysphagia requring chronic J tube feeds: Resume regular feeds-have consulted nutrition.  History of seizures:Continue Onfi.  Hx of cerebral palsy/spastic quadriplegic: Has a intrathecal baclofen pump in place.    YYF:[id number] to neurologic issues-on BIPAP Qhs  Disposition: Remain inpatient  Antimicrobial agents  See below  Anti-infectives    Start     Dose/Rate Route Frequency Ordered Stop   01/01/15 0400  vancomycin (VANCOCIN) IVPB 750 mg/150 ml premix     750 mg 150 mL/hr over 60 Minutes Intravenous Every 12 hours 12/31/14 1601     12/31/14 2200  piperacillin-tazobactam (ZOSYN) IVPB 3.375 g     3.375 g 12.5 mL/hr over 240 Minutes Intravenous Every 8 hours 12/31/14 1601     12/31/14 2030  fluconazole (DIFLUCAN) 40 MG/ML suspension 200 mg  Status:  Discontinued     200 mg Per Tube Daily 12/31/14 2029 12/31/14 2052   12/31/14 1730  levofloxacin (LEVAQUIN) IVPB 750 mg     750 mg 100 mL/hr over 90 Minutes Intravenous Every 24 hours 12/31/14 1726     12/31/14 1600  vancomycin (VANCOCIN) IVPB 1000 mg/200 mL premix     1,000 mg 200 mL/hr over 60 Minutes Intravenous  Once 12/31/14 1539 12/31/14 1819   12/31/14 1600  piperacillin-tazobactam (ZOSYN) IVPB 3.375 g     3.375 g 100 mL/hr over 30 Minutes Intravenous  Once 12/31/14 1539 12/31/14 1650      DVT Prophylaxis: Prophylactic Lovenox   Code Status:  DNR  Family Communication Mother at bedside  Procedures: None  CONSULTS:  ID and hematology/oncology  Time spent 30 minutes-Greater than 50% of this time was spent in counseling, explanation of diagnosis, planning of further management, and coordination of care.  MEDICATIONS: Scheduled Meds: . sodium chloride   Intravenous Once  . Bean Aid  2 capsule Per Tube TID WC  . cholecalciferol  1,000 Units Per Tube Daily  . cloBAZam  3.75 mg Per Tube TID  . diazepam  12.5 mg Rectal Once  . diphenhydrAMINE  25 mg Intravenous Once  . enoxaparin (LOVENOX) injection  40 mg  Subcutaneous Q24H  . famotidine  40 mg Intravenous Q12H  . [START ON 01/02/2015] filgrastim  480 mcg Subcutaneous Daily  . folic acid  1 mg Per Tube Q24H  . furosemide  20 mg Intravenous Once  . ibuprofen  600 mg  Oral Once  . levalbuterol  1.25 mg Nebulization BID  . levofloxacin (LEVAQUIN) IV  750 mg Intravenous Q24H  . lidocaine      . methylPREDNISolone (SOLU-MEDROL) injection  80 mg Intravenous Once  . methylPREDNISolone (SOLU-MEDROL) injection  40 mg Intravenous Q12H  . metoCLOPramide  10 mg Per Tube TID AC & HS  . omeprazole  20 mg Oral BID  . piperacillin-tazobactam (ZOSYN)  IV  3.375 g Intravenous Q8H  . sodium chloride  3 mL Intravenous Q12H  . sucralfate  1 g Per Tube 4 times per day  . vancomycin  750 mg Intravenous Q12H  . Zinc 229m/5ml  244 mg Per Tube BID   Continuous Infusions:  PRN Meds:.acetaminophen (TYLENOL) oral liquid 160 mg/5 mL, baclofen, furosemide, ipratropium, levalbuterol, ondansetron **OR** ondansetron (ZOFRAN) IV, simethicone    PHYSICAL EXAM: Vital signs in last 24 hours: Filed Vitals:   01/01/15 0108 01/01/15 0121 01/01/15 0419 01/01/15 0720  BP:  108/53    Pulse:  91    Temp:   101.7 F (38.7 C)   TempSrc:   Rectal   Resp: 20 18    Height:      Weight:   57.289 kg (126 lb 4.8 oz)   SpO2: 100% 97%  100%    Weight change:  Filed Weights   12/31/14 1435 12/31/14 1904 01/01/15 0419  Weight: 54.432 kg (120 lb) 57.97 kg (127 lb 12.8 oz) 57.289 kg (126 lb 4.8 oz)   Body mass index is 25.09 kg/(m^2).   Gen Exam: Awake and alert Neck: Supple Chest: B/L Clear anteriorly  CVS: S1 S2 Regular, no murmurs.  Abdomen: soft, BS +, non tender, non distended. G-tube in place Extremities: no edema, lower extremities warm to touch. Neurologic: Decreased bulk-0 X 5 in all 4 ext   Intake/Output from previous day:  Intake/Output Summary (Last 24 hours) at 01/01/15 1112 Last data filed at 01/01/15 0758  Gross per 24 hour  Intake    400 ml  Output    250 ml  Net    150 ml     LAB RESULTS: CBC  Recent Labs Lab 12/31/14 1450 01/01/15 0630  WBC 1.1* 1.9*  HGB 7.0* 6.7*  HCT 23.6* 22.9*  PLT 137* 151  MCV 109.8* 108.5*  MCH 32.6 31.8  MCHC 29.7*  29.3*  RDW 18.3* 18.4*  LYMPHSABS 0.6*  --   MONOABS 0.1  --   EOSABS 0.0  --   BASOSABS 0.0  --     Chemistries   Recent Labs Lab 12/31/14 1450 12/31/14 2330 01/01/15 0630  NA 141  --  140  K 4.0  --  3.6  CL 100*  --  99*  CO2 34*  --  35*  GLUCOSE 96  --  83  BUN 23*  --  18  CREATININE 0.51*  --  0.56*  CALCIUM 8.3*  --  8.2*  MG  --  2.3  --     CBG: No results for input(s): GLUCAP in the last 168 hours.  GFR Estimated Creatinine Clearance: 98.5 mL/min (by C-G formula based on Cr of 0.56).  Coagulation profile No results for input(s): INR, PROTIME in the last 168 hours.  Cardiac  Enzymes No results for input(s): CKMB, TROPONINI, MYOGLOBIN in the last 168 hours.  Invalid input(s): CK  Invalid input(s): POCBNP No results for input(s): DDIMER in the last 72 hours. No results for input(s): HGBA1C in the last 72 hours. No results for input(s): CHOL, HDL, LDLCALC, TRIG, CHOLHDL, LDLDIRECT in the last 72 hours.  Recent Labs  12/31/14 2330  TSH 2.135    Recent Labs  12/31/14 1450  RETICCTPCT 2.2   No results for input(s): LIPASE, AMYLASE in the last 72 hours.  Urine Studies No results for input(s): UHGB, CRYS in the last 72 hours.  Invalid input(s): UACOL, UAPR, USPG, UPH, UTP, UGL, UKET, UBIL, UNIT, UROB, ULEU, UEPI, UWBC, URBC, UBAC, CAST, UCOM, BILUA  MICROBIOLOGY: Recent Results (from the past 240 hour(s))  Urine culture     Status: None (Preliminary result)   Collection Time: 12/31/14  2:54 PM  Result Value Ref Range Status   Specimen Description URINE, CATHETERIZED  Final   Special Requests NONE  Final   Culture CULTURE REINCUBATED FOR BETTER GROWTH  Final   Report Status PENDING  Incomplete  MRSA PCR Screening     Status: None   Collection Time: 12/31/14  7:17 PM  Result Value Ref Range Status   MRSA by PCR NEGATIVE NEGATIVE Final    Comment:        The GeneXpert MRSA Assay (FDA approved for NASAL specimens only), is one component of  a comprehensive MRSA colonization surveillance program. It is not intended to diagnose MRSA infection nor to guide or monitor treatment for MRSA infections.     RADIOLOGY STUDIES/RESULTS: Ct Pelvis Wo Contrast  01/01/2015  CLINICAL DATA:  Cerebral palsy.  Clinical concern for osteomyelitis. EXAM: CT PELVIS WITHOUT CONTRAST TECHNIQUE: Multidetector CT imaging of the pelvis was performed following the standard protocol without intravenous contrast. COMPARISON:  10/15/2014 CT abdomen/pelvis. FINDINGS: Reproductive: Normal prostate and seminal vesicles. Bladder: Collapsed and grossly normal urinary bladder. Visualized ureters are normal caliber. Bowel: Visualized small and large bowel are normal caliber with no bowel wall thickening. Oral contrast traverses to the distal rectum. Partially visualized is an enteric tube in the distal duodenum and proximal jejunum, with the tip not seen on this study. Vascular/Lymphatic: No pathologically enlarged lymph nodes in the pelvis. Other: No pneumoperitoneum, ascites or focal fluid collection. Musculoskeletal: There is curvilinear gas, fat stranding and ill-defined fluid in the posterior medial left gluteal subcutaneous soft tissues (series 4/ image 125), suggestive of an incompletely visualized decubitus ulcer, with extension of the fat stranding and fluid to the inferior margin of the left ischium. There is mild focal cortical erosion at the inferior left ischium with associated periosteal reaction, in keeping with osteomyelitis. No presacral fluid or fat stranding. No evidence of a sacral decubitus ulcer. Subcutaneous ventral right lower quadrant spinal stimulator device is noted with lead coursing posteriorly and entering the lumbar spinal canal, with the tip not visualized on this study. No aggressive appearing focal osseous lesions. There is stable bilateral congenital hip dysplasia with shallow acetabula and stable chronic superolateral hip dislocation  bilaterally, with stable chronic small underdeveloped femoral heads bilaterally. There is stable diffuse osteopenia. No osseous fracture. No additional focal cortical erosions or periosteal reaction. Partially visualized is bilateral posterior spinal fusion hardware with posterior bilateral fusion rods and interlocking screws extending into the bilateral iliac wings, with no evidence of hardware fracture or loosening. IMPRESSION: 1. Osteomyelitis of the inferior left ischium associated with curvilinear fat stranding, fluid and gas in the  posteromedial left gluteal subcutaneous soft tissues, presumably due to a medial left gluteal decubitus ulcer (which is incompletely visualized on this study). No focal drainable fluid collection. 2. No pelvic ascites or intraperitoneal fluid collection. No presacral fluid or inflammatory change. 3. 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 Electronically Signed   By: Ilona Sorrel M.D.   On: 01/01/2015 10:34   Portable Chest 1 View  01/01/2015  CLINICAL DATA:  Shortness of breath, cough, dyspnea.  Cerebral palsy EXAM: PORTABLE CHEST 1 VIEW COMPARISON:  Yesterday FINDINGS: Porta catheter on the right with tip in good position at the SVC level. Persistent hypoventilation and linear opacities at the bases. There is no edema, consolidation, effusion, or pneumothorax. Normal heart size and mediastinal contours. Extensive spinal fixation. IMPRESSION: Chronic hypoventilation with basilar atelectasis or scarring. Electronically Signed   By: Monte Fantasia M.D.   On: 01/01/2015 07:40   Dg Chest Port 1 View  12/31/2014  CLINICAL DATA:  Chest pain recent pneumonia persistent fever for 1 week EXAM: PORTABLE CHEST 1 VIEW COMPARISON:  10/19/2014 FINDINGS: Spinal rods noted. Port-A-Cath stable on the right. Low lung volumes with bibasilar atelectasis. Heart size normal. IMPRESSION: Mild bilateral lower lobe atelectasis. No definite evidence of pneumonia.  Subtle infiltrate could be due obscured by the bilateral lower lobe atelectasis however. Electronically Signed   By: Skipper Cliche M.D.   On: 12/31/2014 17:39    Oren Binet, MD  [facility] Pager:[phone]  If 7PM-7AM, please contact night-coverage [email or web address] Password [email or web address] 01/01/2015, 11:12 AM   LOS: 1 day

## 2015-01-01 NOTE — Progress Notes (Signed)
Pt's mother requesting that patient have a rectal probe placed to check continuous temps, stating that she "doesn't want him turned every 4 hours for rectal temps" due to pain from rods in his back.  She states that axillary temps aren't accurate and staff cannot take oral temps because "he bites the thermometer and he's a mouth breather, so that wouldn't be accurate either."  [employer] notified of the above.  No new orders received.  Pt's mother informed of the importance of turning the patient frequently to prevent further bed sores.  She verbalizes understanding but states that he cannot be turned frequently due to his back.  Pt's mother agrees to have a rectal temp checked when she returns to the room, stating that she is going to get something to eat.  Jodell Cipro

## 2015-01-01 NOTE — Consult Note (Signed)
Referral MD  Reason for Referral: Pancytopenia - chronic  Chief Complaint  Patient presents with  . Fever  : He cannot give any history  HPI: Jeffery [name] is well known to me.  He is a 24yo white male with cerebral palsy.  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 He was last admitted back in September. He basically been treated at home. We do see him in the office. He gets Neupogen on occasion.  He's had high temperatures over the past several days. He has had lethargy. He has had some breathing difficulties. As such, he had to be admitted. Again, lot of times, he can be treated as now patient.  Whenever he gets sick, his blood counts go down. He actually was set up for a transfusion in my office tomorrow.  We was admitted, his white count 1.1. Hemoglobin 7 and platelet count 137.  He has not had any issues with bleeding.  He has a J-tube that he gets fed through. He has had aspiration the past. He did have a chest x-ray on admission. He has some mild bilateral lower lobe atelectasis. No obvious pneumonia. There is some concern about the possibility of osteomyelitis. He does have some skin breakdown. He is scheduled for a CT scan of his pelvis.  His parents are doing a great job taking care of him. His mother is very proactive and very attentive to his needs. She really knows what is best for him. I have typically adhered to her recommendations.  Cultures have been taken. He is on IV antibiotics.  He did have a seizure over the weekend. This was a very short lived seizure. He typically tends to have these when he gets sick.      Past Medical History  Diagnosis Date  . CP (cerebral palsy), spastic, quadriplegic ([place])   . Osteoporosis   . Undescended testes   . Seasonal allergies   . IVH (intraventricular hemorrhage) ([place]) 02/13/1990    Grade IV  . Hip dislocation, bilateral ([place])   . Dysphagia   . Retinopathy of prematurity   . 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  Neuromuscular scoliosis   . Osteoporosis   . Complex partial seizures ([place])   . Generalized convulsive epilepsy without mention of intractable epilepsy   . Sinus bradycardia     HR drops to 38-40 while sleeping  . Blister of right heel     fluid filled; origin unknown  . Kidney stones     ?  Marland Kitchen Pneumonia      chronic pneumonia ,respitory failure dx Augest 2014  . Aspiration pneumonia ([place])     "chronic" (04/12/2014)  . OSA treated with BiPAP     "since age 25"   . Anemia   . 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 GERD (gastroesophageal reflux disease)   . Epilepsy ([place])   . 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 Neutropenia ([place]) 07/03/2014  :  Past Surgical History  Procedure Laterality Date  . Mole removal  "several B/T 2008-2010"    "from all over"  . Jejunostomy feeding tube  03/08/2013; ~ 09/2013; ~ 01/2014    "transgastric-jujunal feeding tube"  . Gastrostomy tube placement  11./02/1998       . Button change  12/15/2010    Procedure: BUTTON CHANGE;  Surgeon: Gatha Mayer, MD;  Location: [facility];  Service: Endoscopy;  Laterality: N/A;  . Peg  placement  10/07/2011    Procedure: PERCUTANEOUS ENDOSCOPIC GASTROSTOMY (PEG) REPLACEMENT;  Surgeon: Lafayette Dragon, MD;  [facility]: [facility];  Service: Endoscopy;  Laterality: N/A;  Needs 18 F 2.5 button ordered-dl  . Inguinal hernia repair Bilateral 1992  . Retinopathy of prematurity surgery  1992  . Achilles tendon lengthening Bilateral 12/1998  . Tendon release  12/1998    Soft tissue releases  wrists and fingers [Other]  . Infusion pump implantation  07/25/2000; 2013    Intrathecal baclofen pump  . Peg placement N/A 06/05/2012    Procedure: PERCUTANEOUS ENDOSCOPIC GASTROSTOMY (PEG) REPLACEMENT;  Surgeon: Lafayette Dragon, MD;  [facility];  Service: Endoscopy;  Laterality: N/A;  button 87f.2.5cm  . Strabismus surgery Bilateral 1993    "3 on right; 2 on left"  .  Back surgery  ~ 2008    Harrington Rods in back needs to be log rolled  . Tendon repair Bilateral 09/05/2012    Procedure: LENGTHENING OF DIGITAL FLEXOR TENDONS BILTERAL HANDS;  Surgeon: DJolyn Nap MD;  [facility]  Service: Orthopedics;  Laterality: Bilateral;  . Peg placement N/A 09/13/2012    Procedure: PERCUTANEOUS ENDOSCOPIC GASTROSTOMY (PEG) REPLACEMENT;  Surgeon: DLafayette Dragon MD;  [facility];  Service: Endoscopy;  Laterality: N/A;  . Flexible sigmoidoscopy N/A 10/30/2012    Procedure: FLEXIBLE SIGMOIDOSCOPY;  Surgeon: RCleotis Nipper MD;  [facility];  Service: Endoscopy;  Laterality: N/A;  . Peg placement N/A 11/15/2012    Procedure: PERCUTANEOUS ENDOSCOPIC GASTROSTOMY (PEG) REPLACEMENT;  Surgeon: MJeryl Columbia MD;  [facility];  Service: Endoscopy;  Laterality: N/A;  . Gastrostomy tube placement  11./02/1998  . Rectal biopsy  10/29/2012    S/P diarrhea from Vancomycin  . Portacath placement Right 11/25/2012    chest  . Eye surgery    :   Current facility-administered medications:  .  0.9 %  sodium chloride infusion, , Intravenous, Once, PVolanda Napoleon MD .  acetaminophen (TYLENOL) solution 500 mg, 500 mg, Per Tube, Q6H PRN, PVolanda Napoleon MD .  acetaminophen (TYLENOL) solution 650 mg, 650 mg, Oral, Q6H PRN, 650 mg at 01/01/15 0151 **OR** acetaminophen (TYLENOL) suppository 650 mg, 650 mg, Rectal, Q6H PRN, NReyne Dumas MD .  baclofen (LIORESAL) tablet 10 mg, 10 mg, Oral, TID PRN, NReyne Dumas MD .  Bean Aid, 2 capsule, Per Tube, TID WC, NReyne Dumas MD .  cholecalciferol (D-VI-SOL) 400 UNIT/ML oral liquid 1,000 Units, 1,000 Units, Per Tube, Daily, NReyne Dumas MD .  cloBAZam (ONFI) 2.5 MG/ML oral suspension 3.75 mg, 3.75 mg, Per Tube, TID, NReyne Dumas MD .  diazepam (DIASTAT) rectal kit 12.5 mg, 12.5 mg, Rectal, Once, KJeryl Columbia NP .  diphenhydrAMINE (BENADRYL) injection 25 mg, 25 mg, Intravenous, Once, PVolanda Napoleon MD .  enoxaparin (LOVENOX) injection 40 mg, 40 mg, Subcutaneous, Q24H, NReyne Dumas MD, 40 mg at 12/31/14 2200 .  famotidine (PEPCID) IVPB 20 mg premix, 40 mg, Intravenous, Q12H, PVolanda Napoleon MD, 40 mg at 12/31/14 2327 .  filgrastim (NEUPOGEN) injection 480 mcg, 480 mcg, Subcutaneous, Daily, PVolanda Napoleon MD .  folic acid (FOLVITE) tablet 1 mg, 1 mg, Per Tube, Q24H, NReyne Dumas MD .  furosemide (LASIX) injection 20 mg, 20 mg, Intravenous, Once, PVolanda Napoleon MD .  ipratropium (ATROVENT) nebulizer solution 0.5 mg, 0.5 mg, Nebulization, Q6H PRN, NReyne Dumas MD .  levalbuterol (XOPENEX) nebulizer solution 0.63 mg, 0.63 mg, Nebulization, Q6H PRN, NReyne Dumas  MD, 0.63 mg at 01/01/15 0045 .  levalbuterol (XOPENEX) nebulizer solution 1.25 mg, 1.25 mg, Nebulization, BID, Rhetta Mura Schorr, NP .  levofloxacin (LEVAQUIN) IVPB 750 mg, 750 mg, Intravenous, Q24H, Rebecka Apley, RPH .  methylPREDNISolone sodium succinate (SOLU-MEDROL) 125 mg/2 mL injection 80 mg, 80 mg, Intravenous, Once, Volanda Napoleon, MD .  methylPREDNISolone sodium succinate (SOLU-MEDROL) 40 mg/mL injection 40 mg, 40 mg, Intravenous, Q12H, Reyne Dumas, MD, 40 mg at 01/01/15 0628 .  metoCLOPramide (REGLAN) 5 MG/5ML solution 10 mg, 10 mg, Per Tube, TID AC & HS, Reyne Dumas, MD, 10 mg at 01/01/15 0628 .  omeprazole (PRILOSEC) 2 mg/mL oral suspension SUSP 20 mg, 20 mg, Oral, BID, Reyne Dumas, MD, 20 mg at 12/31/14 2200 .  ondansetron (ZOFRAN) tablet 4 mg, 4 mg, Oral, Q6H PRN **OR** ondansetron (ZOFRAN) injection 4 mg, 4 mg, Intravenous, Q6H PRN, Reyne Dumas, MD .  piperacillin-tazobactam (ZOSYN) IVPB 3.375 g, 3.375 g, Intravenous, Q8H, Cassie L Stewart, RPH, 3.375 g at 01/01/15 0141 .  simethicone (MYLICON) chewable tablet 120 mg, 120 mg, Oral, QID PRN, Reyne Dumas, MD .  sodium chloride 0.9 % injection 3 mL, 3 mL, Intravenous, Q12H, Reyne Dumas, MD, 3 mL at 12/31/14 2200 .  sucralfate (CARAFATE) 1 GM/10ML  suspension 1 g, 1 g, Per Tube, 4 times per day, Reyne Dumas, MD, 1 g at 01/01/15 5784 .  vancomycin (VANCOCIN) IVPB 750 mg/150 ml premix, 750 mg, Intravenous, Q12H, Cassie L Stewart, RPH, 750 mg at 01/01/15 6962 .  Zinc 263m/5ml, 244 mg, Per Tube, BID, NReyne Dumas MD, 244 mg at 12/31/14 2200:  . sodium chloride   Intravenous Once  . Bean Aid  2 capsule Per Tube TID WC  . cholecalciferol  1,000 Units Per Tube Daily  . cloBAZam  3.75 mg Per Tube TID  . diazepam  12.5 mg Rectal Once  . diphenhydrAMINE  25 mg Intravenous Once  . enoxaparin (LOVENOX) injection  40 mg Subcutaneous Q24H  . famotidine  40 mg Intravenous Q12H  . filgrastim  480 mcg Subcutaneous Daily  . folic acid  1 mg Per Tube Q24H  . furosemide  20 mg Intravenous Once  . levalbuterol  1.25 mg Nebulization BID  . levofloxacin (LEVAQUIN) IV  750 mg Intravenous Q24H  . methylPREDNISolone (SOLU-MEDROL) injection  80 mg Intravenous Once  . methylPREDNISolone (SOLU-MEDROL) injection  40 mg Intravenous Q12H  . metoCLOPramide  10 mg Per Tube TID AC & HS  . omeprazole  20 mg Oral BID  . piperacillin-tazobactam (ZOSYN)  IV  3.375 g Intravenous Q8H  . sodium chloride  3 mL Intravenous Q12H  . sucralfate  1 g Per Tube 4 times per day  . vancomycin  750 mg Intravenous Q12H  . Zinc 2456m5ml  244 mg Per Tube BID  :  Allergies  Allergen Reactions  . Depakote [Divalproex Sodium]     Causes pancreatitis   . Vimpat [Lacosamide] Rash  . Keppra [Levetiracetam] Other (See Comments)    Bone marrow suppression  . Adhesive [Tape] Other (See Comments)    Rips skin off (paper tape is ok)  . Neulasta [Pegfilgrastim] Other (See Comments)    Fever, tachycardia  :  Family History  Problem Relation Age of Onset  . Adopted: Yes  :  Social History   Social History  . Marital Status: Single    Spouse Name: N/A  . Number of Children: 0  . Years of Education: N/A   Occupational History  .  Student    .     Social History Main  Topics  . Smoking status: Never Smoker   . Smokeless tobacco: Never Used     Comment: never used tobacco  . Alcohol Use: No  . Drug Use: No  . Sexual Activity: No   Other Topics Concern  . Not on file   Social History Narrative   Pt lives at home with his legal guardians Jenne Campus)        :  Pertinent items are noted in HPI.  Exam: Patient Vitals for the past 24 hrs:  BP Temp Temp src Pulse Resp SpO2 Height Weight  01/01/15 0419 - (!) 101.7 F (38.7 C) Rectal - - - - 126 lb 4.8 oz (57.289 kg)  01/01/15 0121 (!) 108/53 mmHg - - 91 18 97 % - -  01/01/15 0108 - - - - 20 100 % - -  01/01/15 0045 - - - - - 100 % - -  12/31/14 2141 - - - - - 96 % - -  12/31/14 2100 103/66 mmHg 98.3 F (36.8 C) Oral 87 20 100 % - -  12/31/14 1904 - (!) 100.6 F (38.1 C) Axillary - - - 4' 11.5" (1.511 m) 127 lb 12.8 oz (57.97 kg)  12/31/14 1830 112/69 mmHg - - 115 26 96 % - -  12/31/14 1815 110/72 mmHg - - 106 23 98 % - -  12/31/14 1800 115/80 mmHg - - 120 (!) 33 97 % - -  12/31/14 1745 117/74 mmHg - - 115 (!) 28 92 % - -  12/31/14 1730 115/68 mmHg - - 117 24 92 % - -  12/31/14 1715 110/61 mmHg - - 95 21 96 % - -  12/31/14 1700 107/67 mmHg - - 94 20 95 % - -  12/31/14 1645 107/63 mmHg - - 95 20 95 % - -  12/31/14 1515 - 99.8 F (37.7 C) Temporal - - - - -  12/31/14 1500 107/67 mmHg - - 98 18 97 % - -  12/31/14 1445 113/66 mmHg - - 97 14 96 % - -  12/31/14 1435 - - - - - 93 % - 120 lb (54.432 kg)  12/31/14 1430 113/65 mmHg - - 103 17 93 % - -  12/31/14 1416 113/70 mmHg - - 103 20 (!) 88 % - -  12/31/14 1415 113/70 mmHg - - 104 - 90 % - -    as above    Recent Labs  12/31/14 1450  WBC 1.1*  HGB 7.0*  HCT 23.6*  PLT 137*    Recent Labs  12/31/14 1450 01/01/15 0630  NA 141 140  K 4.0 3.6  CL 100* 99*  CO2 34* 35*  GLUCOSE 96 83  BUN 23* 18  CREATININE 0.51* 0.56*  CALCIUM 8.3* 8.2*    Blood smear review:  None  Pathology: None     Assessment and Plan:  Jeffery [name] is a  24 year old white male with cerebral palsy. He tends to have aspiration issues. He now has a temperature. We are not sure as to where his temperature is coming from. Hopefully,, it is not osteomyelitis.  His Port-A-Cath site looks intact.  He has no obvious rashes.  I agree with the antibiotics.  He needs Neupogen.  He also needs to be transfused.  He typically gets over these episodes in about 3 or 4 days.  I very much appreciate everybody trying to help him out on the  floor. It is a very difficult situation for him. He really has done remarkably well given the incredible limitations that he has because of his cerebral palsy.  We will follow along.   [name]  Psalm 37:11

## 2015-01-01 NOTE — Telephone Encounter (Signed)
Jeffery [name], called to let us know that Jeffery [name] was admitted to [name] yesterday. She reports that his fever was spiking and [name] admitted him. He is on 3W-Bed 12. He was going to get a transfusion but could not do it due to fever. Hgb is at 7 but they have him on cooling blanket to lower his fever. 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 Infectious disease has not been there yet. Jeffery [name] is at home and is exhausted. The hospitalist is supposed to call her. She would like to know from Dr. Gaynell Face if the osteomyelitis can migrate to his spine and affect him? She is asking that he pull up CT report from [name]'s chart and check on the results of the CT to give his advise. Jeffery [name] will turn cellphone off to rest but if you could leave a voicemail letting her know if it will affect his spine and call her back tomorrow or if it will not affect it. She states that [name] is with him at the hospital tonight and Dr. Gaynell Face could also call him if needed.  Jeffery FerraraMC:[id number] [name]: [phone]

## 2015-01-01 NOTE — Progress Notes (Signed)
Set patient up on Bipap 12/7 is setting that Mom who is at beside stated was his home regimen.  35% fio2.  Pt tolerating well at this time.

## 2015-01-01 NOTE — Evaluation (Signed)
SLP Cancellation Note  Patient Details Name: Jeffery [name] MRN: [id number] DOB: 02/02/1990   Cancelled treatment:       Reason Eval/Treat Not Completed: Other (comment) (pt getting dressing changed currently with RN, will reattempt eval at later time)   Luanna Salk, [facility] SLP [phone]

## 2015-01-01 NOTE — Progress Notes (Signed)
Shift event note: Rec'd MULTIPLE calls regarding demands of this caregiver. Per nursing this "mother " is a foster mother. She has made very un-necessary demands regarding meds and tx's. I initially tried to be as accommodating as was able, but when she requested a rectal probe thermometer I declined. She wanted the continuous probe so that he did not have to be turned or moved.  Tried to explain that turning and movement was very important. She then refused to allow pt to get his PRBC's or his Neupogen because I would not order a continuous rectal probe to monitor temps and prevent need for turning or moving pt. Pt currently has unstagable pressure wounds obviously d/t this lack of movement/turning at home . I understand from RN that she attempted to call Dr Jonette Eva at home to try to enlist his support and got the PA. She finally agreed to allow RN to give Neupogen but continues to refuse to allow pt to get PRBC's. I spoke w/ [facility] AC regarding this matter who was in agreement w/ need for turning and movement and supports declining to provide rectal probe for this pt. Will continue to monitor pt closely.  Jeryl Columbia, NP-C [facility] Pager [phone]

## 2015-01-01 NOTE — Telephone Encounter (Signed)
At mother's request I looked at the imaging study.  I don't know what I am looking at.  He will need 6 weeks of antibiotics.  I told her that it was unlikely to spread to his spine unless the pressure ulcer extended to that location.  He has an appointment with Dr. Maryjean Ka in early January.

## 2015-01-01 NOTE — Procedures (Signed)
GJ tube exchange No comp/EBL

## 2015-01-01 NOTE — Consult Note (Addendum)
WOC wound consult note Reason for Consult: Consult requested for bilat buttocks wounds.  Mother is a Marine scientist and also the primary caregiver when at home and is very well-informed regarding topical treatment.  Left buttock wound is currently a stage 3 pressure injury, according to her it has greatly improved and was previously unstageable.  Right buttock is a healing stage 2 pressure injury.   Pressure Ulcer POA: Yes Dressing procedure/placement/frequency: Mother requests that turning and assessment be performed at a later time, when a cooling blanket is ordered to minimize amt of repositioning.  Bedside nurse will plan to call when this is available. Mother has been using Medihoney and foam dressings to promote healing and states this has been very successful. She uses bag balm, balmex, and zinx to protect skin from frequent diarrhea when it occurs.  She declines offer of air mattress replacement and states the alternating air mattress which is currently in place is adequate for pressure reduction.   She has photos of the wound on her camera and measurements.  Stage 3 pressure injury to left buttock: 100% red and moist, 5.2X6.8X3.6cm, mod amt yellow drainage, no odor.  Will add information regarding stage 2 pressure injury to right buttock upon additional assessment when it occurs later this AM.  Late note: stage 2:  .3X.3X.1cm, red and moist, no odor, small amt yellow drainage. Dressing change performed desc by mother to both sites during Jeffery [name] assessment. Continue present plan of care with Medihoney and foam dressings to promote healing.  This topical treatment is not available in the [place] but mother has her own supplies at the bedside which can be used. Please re-consult if further assistance is needed.  Thank-you,  Julien Girt MSN, [place], [place], [place], [place]

## 2015-01-01 NOTE — Progress Notes (Signed)
Patient not ready for BIPAP at this time. Still receiving bedtime medications. RT will attempt later.

## 2015-01-02 ENCOUNTER — Other Ambulatory Visit: Payer: Medicaid Other

## 2015-01-02 DIAGNOSIS — M869 Osteomyelitis, unspecified: Secondary | ICD-10-CM

## 2015-01-02 DIAGNOSIS — M8610 Other acute osteomyelitis, unspecified site: Secondary | ICD-10-CM

## 2015-01-02 DIAGNOSIS — D649 Anemia, unspecified: Secondary | ICD-10-CM

## 2015-01-02 LAB — BASIC METABOLIC PANEL
Anion gap: 10 (ref 5–15)
BUN: 22 mg/dL — ABNORMAL HIGH (ref 6–20)
CALCIUM: 8.4 mg/dL — AB (ref 8.9–10.3)
CO2: 31 mmol/L (ref 22–32)
CREATININE: 0.59 mg/dL — AB (ref 0.61–1.24)
Chloride: 100 mmol/L — ABNORMAL LOW (ref 101–111)
GFR calc Af Amer: >60 mL/min (ref >60)
Glucose, Bld: 82 mg/dL (ref 65–99)
Potassium: 3.1 mmol/L — ABNORMAL LOW (ref 3.5–5.1)
SODIUM: 141 mmol/L (ref 135–145)

## 2015-01-02 LAB — CBC
HCT: 33.5 % — ABNORMAL LOW (ref 39.0–52.0)
HEMOGLOBIN: 10.4 g/dL — AB (ref 13.0–17.0)
MCH: 31.4 pg (ref 26.0–34.0)
MCHC: 31 g/dL (ref 30.0–36.0)
MCV: 101.2 fL — AB (ref 78.0–100.0)
Platelets: 135 10*3/uL — ABNORMAL LOW (ref 150–400)
RBC: 3.31 MIL/uL — ABNORMAL LOW (ref 4.22–5.81)
RDW: 23 % — ABNORMAL HIGH (ref 11.5–15.5)
WBC: 5.5 10*3/uL (ref 4.0–10.5)

## 2015-01-02 LAB — TYPE AND SCREEN
ABO/RH(D): O POS
ANTIBODY SCREEN: NEGATIVE
Unit division: 0
Unit division: 0

## 2015-01-02 MED ORDER — POTASSIUM CHLORIDE 20 MEQ/15ML (10%) PO SOLN
40.0000 meq | Freq: Once | ORAL | Status: AC
  Administered 2015-01-02: 40 meq
  Filled 2015-01-02: qty 30

## 2015-01-02 MED ORDER — VITAMIN C 500 MG PO TABS
250.0000 mg | ORAL_TABLET | Freq: Three times a day (TID) | ORAL | Status: DC
  Administered 2015-01-02 (×2): 250 mg
  Filled 2015-01-02 (×3): qty 1

## 2015-01-02 MED ORDER — IBUPROFEN 100 MG/5ML PO SUSP
600.0000 mg | Freq: Four times a day (QID) | ORAL | Status: DC | PRN
Start: 2015-01-02 — End: 2015-01-07
  Administered 2015-01-02 – 2015-01-06 (×3): 600 mg via ORAL
  Filled 2015-01-02 (×4): qty 30

## 2015-01-02 MED ORDER — VANCOMYCIN HCL IN DEXTROSE 750-5 MG/150ML-% IV SOLN
750.0000 mg | Freq: Two times a day (BID) | INTRAVENOUS | Status: DC
  Administered 2015-01-02 – 2015-01-03 (×2): 750 mg via INTRAVENOUS
  Filled 2015-01-02 (×3): qty 150

## 2015-01-02 MED ORDER — ACETAMINOPHEN 160 MG/5ML PO SOLN
650.0000 mg | Freq: Four times a day (QID) | ORAL | Status: DC | PRN
  Administered 2015-01-03 – 2015-01-06 (×6): 650 mg
  Filled 2015-01-02 (×7): qty 20.3

## 2015-01-02 MED ORDER — FUROSEMIDE 10 MG/ML IJ SOLN
20.0000 mg | Freq: Once | INTRAMUSCULAR | Status: AC
  Administered 2015-01-02: 20 mg via INTRAVENOUS

## 2015-01-02 NOTE — Care Management Note (Addendum)
Case Management Note  Patient Details  Name: Jeffery [name] MRN: [id number] Date of Birth: 1990/10/31  Subjective/Objective:  Pt admitted for Sepsis. Jeffery [name] is a 24 y.o. male Cerebral palsy, and spastic quadriplegia. Pt is cared for at home by his parents, including his mother who is a Marine scientist. In the last week, he has been having persistently high fevers of 102-103F. He was seen by his PCP given a course of levofloxacin for pneumonia. Due to still having a fever after 5 day course, he was given 2nd run. He would need increasing lengths of time on his bipap due to increase work of breathing. His father reported that his blood work revealed that he needed blood transfusion and neupogen but was deferred to come to cancer center due to his fever. They were instructed to come to the ED for further evaluation. His temp has been as high as 101.9 on admit, he suffers from pancytopenia. WBC of 1.9, ANC .04 hgb 7.                  Action/Plan: CM was able to meet the patient and Dad and spoke with mom via phone. Mother stated that MD would need to call PCP Jobe Igo in ref to care provided at hospital and the plan of care that will be needed post d/c. Pt is active with [facility] ( [facility]) RN Lorenza Burton . Pt will need IV antibiotics post d/c. Pt has [place] cath. Mother states that Risk Management at [facility] will not allow her to access or de-access porta cath. MD Ghimire notified to call PCP in am and discuss care. CM did call [facility] today and IV Antibiotics will be administered via: [place]. CM will f/u in am with additional disposition needs.    Expected Discharge Date:                  Expected Discharge Plan:  [place]  In-House Referral:  NA  Discharge planning Services  CM Consult  Post Acute Care Choice:  [place], Resumption of Svcs/PTA Provider Choice offered to:  Parent  DME Arranged:  IV Antibiotics and IV pump and tubing DME Agency:   [facility] HH Arranged:  RN [place] Agency:  [facility]  Status of Service:  Completed, signed off  Medicare Important Message Given:    Date Medicare IM Given:    Medicare IM give by:    Date Additional Medicare IM Given:    Additional Medicare Important Message give by:     If discussed at [place] of Stay Meetings, dates discussed:    Additional Comments:  1117 01-07-15 Jacqlyn Krauss, RN,BSN [phone] Plan is for d/c today. CM did call [facility] to make sure aware of disposition needs. CM did call Sandi Mariscal with [facility] in ref to Vancomycin on hold and plan to continue with Invanz. CM did call Denyse Amass with [facility] and request via mom is to have [facility] pick up pt no later than 1pm. Medications have been picked up via our main pharmacy for family to take home. [name] Father will be here to pick up additional belongings. CM will fax information to [facility]. No further needs from CM at this time.   01-06-15 [address] Jacqlyn Krauss, RN,BSN [phone]  CM did speak with Sheppard Evens with [facility]- pt plan for d/c home today. Adam with [facility] is aware of IV abx therapy. Per mother she wants pt to  stay on schedule. CM did leave information with Charge RN to call Adam with [facility] if Vancomycin Rx changes. Charge RN to call either way to make sure pt gets antibiotics needed. Transportation to be set up via [facility]. No further needs from CM at this time.  [place], RN,BSN [phone] CM did speak with Otila Kluver at [facility] and it is approved that mother will be able to administer the iv antibiotics for pt. Pt will d/c home via personal transportation. [facility] RN Services lab draws [employer]. Mom provides wound care. No further needs from CM at this time.    1125 01-03-15 Jacqlyn Krauss, RN,BSN [phone] CM did speak with [facility] in reference to disposition needs. Sheppard Evens did contract IV antibiotic infusions via [facility]. CM did speak  with [place] Liaison for them and we both spoke to mom in ref to disposition needs. Referral was sent via [facility] to see if Medicaid would allow mom to be the infusion nurse for son. MD did speak with MD Jobe Igo in ref to plan of care. Mom stated that if she was not able to administer medications then she wanted to get IV infusions via [facility] near Dr. Antonieta Pert office. CM did call and spoke to [place] at [facility]. Per Roselyn Reef Antibiotics are given in the office, however they will not fill Rx for medications to be sent home. CM will continue to monitor.   Bethena Roys, RN 01/02/2015, 4:49 PM

## 2015-01-02 NOTE — Progress Notes (Signed)
INFECTIOUS DISEASE PROGRESS NOTE  ID: Jeffery [name] is a 24 y.o. male with  Principal Problem:   Sepsis ([place]) Active Problems:   Dysphagia requring chronic J tube feeds   Neuromuscular scoliosis   Generalized convulsive epilepsy ([place])   OSA (obstructive sleep apnea)   Cerebral palsy, quadriplegic ([facility])   Pressure ulcer  Subjective: none  Abtx:  Anti-infectives    Start     Dose/Rate Route Frequency Ordered Stop   01/01/15 0400  vancomycin (VANCOCIN) IVPB 750 mg/150 ml premix     750 mg 150 mL/hr over 60 Minutes Intravenous Every 12 hours 12/31/14 1601     12/31/14 2200  piperacillin-tazobactam (ZOSYN) IVPB 3.375 g     3.375 g 12.5 mL/hr over 240 Minutes Intravenous Every 8 hours 12/31/14 1601     12/31/14 2030  fluconazole (DIFLUCAN) 40 MG/ML suspension 200 mg  Status:  Discontinued     200 mg Per Tube Daily 12/31/14 2029 12/31/14 2052   12/31/14 1730  levofloxacin (LEVAQUIN) IVPB 750 mg     750 mg 100 mL/hr over 90 Minutes Intravenous Every 24 hours 12/31/14 1726     12/31/14 1600  vancomycin (VANCOCIN) IVPB 1000 mg/200 mL premix     1,000 mg 200 mL/hr over 60 Minutes Intravenous  Once 12/31/14 1539 12/31/14 1819   12/31/14 1600  piperacillin-tazobactam (ZOSYN) IVPB 3.375 g     3.375 g 100 mL/hr over 30 Minutes Intravenous  Once 12/31/14 1539 12/31/14 1650      Medications:  Scheduled: . sodium chloride   Intravenous Once  . Bean Aid  2 capsule Per Tube TID WC  . cholecalciferol  1,000 Units Per Tube Daily  . cloBAZam  3.75 mg Per Tube TID  . diazepam  12.5 mg Rectal Once  . diphenhydrAMINE  25 mg Intravenous Once  . enoxaparin (LOVENOX) injection  40 mg Subcutaneous Q24H  . famotidine  40 mg Intravenous Q12H  . feeding supplement (PRO-STAT SUGAR FREE 64)  30 mL Per J Tube TID  . folic acid  1 mg Per Tube Q24H  . furosemide  20 mg Intravenous Once  . furosemide  20 mg Intravenous Once  . gabapentin  900 mg Per Tube 3 times per day  . gatorade (BH)  180 mL  Per Tube 8 times per day  . levalbuterol  1.25 mg Nebulization BID  . levofloxacin (LEVAQUIN) IV  750 mg Intravenous Q24H  . metoCLOPramide  10 mg Per Tube TID AC & HS  . omeprazole  20 mg Oral BID  . piperacillin-tazobactam (ZOSYN)  IV  3.375 g Intravenous Q8H  . potassium chloride  40 mEq Per Tube Once  . pseudoephedrine  30 mg Per Tube 3 times per day  . sodium chloride  3 mL Intravenous Q12H  . sucralfate  1 g Per Tube 4 times per day  . TWOCAL HN  237 mL Per J Tube TID  . vancomycin  750 mg Intravenous Q12H  . vitamin C  250 mg Per Tube TID  . Zinc 280m/5ml  244 mg Per Tube BID    Objective: Vital signs in last 24 hours: Temp:  [99.1 F (37.3 C)-100.2 F (37.9 C)] 99.3 F (37.4 C) (12/01 0450) Pulse Rate:  [79-104] 94 (12/01 0450) Resp:  [12-19] 16 (12/01 0450) BP: (93-120)/(58-79) 120/79 mmHg (12/01 0450) SpO2:  [96 %-100 %] 100 % (12/01 0450) FiO2 (%):  [35 %] 35 % (12/01 0400) Weight:  [62.052 kg (136 lb 12.8  oz)] 62.052 kg (136 lb 12.8 oz) (12/01 0450)   General appearance: alert and no distress Resp: rhonchi LUL Chest wall: no tenderness, port site is clean, no erythema Cardio: regular rate and rhythm GI: normal findings: bowel sounds normal and soft, non-tender and peg site is clean. no d/c.   Lab Results  Recent Labs  01/01/15 0630 01/02/15 0445  WBC 1.9* 5.5  HGB 6.7* 10.4*  HCT 22.9* 33.5*  NA 140 141  K 3.6 3.1*  CL 99* 100*  CO2 35* 31  BUN 18 22*  CREATININE 0.56* 0.59*   Liver Panel  Recent Labs  12/31/14 1450 01/01/15 0630  PROT 5.1* 4.9*  ALBUMIN 2.5* 2.3*  AST 22 17  ALT 34 28  ALKPHOS 124 116  BILITOT 0.8 0.9   Sedimentation Rate  Recent Labs  01/01/15 1307  ESRSEDRATE 90*   C-Reactive Protein  Recent Labs  01/01/15 1307  CRP 0.7    Microbiology: Recent Results (from the past 240 hour(s))  Blood Culture (routine x 2)     Status: None (Preliminary result)   Collection Time: 12/31/14  2:51 PM  Result Value Ref  Range Status   Specimen Description BLOOD  Final   Special Requests BOTTLES DRAWN AEROBIC AND ANAEROBIC 6CC PORT  Final   Culture NO GROWTH < 24 HOURS  Final   Report Status PENDING  Incomplete  Urine culture     Status: None   Collection Time: 12/31/14  2:54 PM  Result Value Ref Range Status   Specimen Description URINE, CATHETERIZED  Final   Special Requests NONE  Final   Culture MULTIPLE SPECIES PRESENT, SUGGEST RECOLLECTION  Final   Report Status 01/01/2015 FINAL  Final  Blood Culture (routine x 2)     Status: None (Preliminary result)   Collection Time: 12/31/14  3:50 PM  Result Value Ref Range Status   Specimen Description BLOOD LEFT HAND  Final   Special Requests BOTTLES DRAWN AEROBIC AND ANAEROBIC 5CC  Final   Culture NO GROWTH < 24 HOURS  Final   Report Status PENDING  Incomplete  MRSA PCR Screening     Status: None   Collection Time: 12/31/14  7:17 PM  Result Value Ref Range Status   MRSA by PCR NEGATIVE NEGATIVE Final    Comment:        The GeneXpert MRSA Assay (FDA approved for NASAL specimens only), is one component of a comprehensive MRSA colonization surveillance program. It is not intended to diagnose MRSA infection nor to guide or monitor treatment for MRSA infections.     Studies/Results: Ct Pelvis Wo Contrast  01/01/2015  CLINICAL DATA:  Cerebral palsy.  Clinical concern for osteomyelitis. EXAM: CT PELVIS WITHOUT CONTRAST TECHNIQUE: Multidetector CT imaging of the pelvis was performed following the standard protocol without intravenous contrast. COMPARISON:  10/15/2014 CT abdomen/pelvis. FINDINGS: Reproductive: Normal prostate and seminal vesicles. Bladder: Collapsed and grossly normal urinary bladder. Visualized ureters are normal caliber. Bowel: Visualized small and large bowel are normal caliber with no bowel wall thickening. Oral contrast traverses to the distal rectum. Partially visualized is an enteric tube in the distal duodenum and proximal jejunum,  with the tip not seen on this study. Vascular/Lymphatic: No pathologically enlarged lymph nodes in the pelvis. Other: No pneumoperitoneum, ascites or focal fluid collection. Musculoskeletal: There is curvilinear gas, fat stranding and ill-defined fluid in the posterior medial left gluteal subcutaneous soft tissues (series 4/ image 125), suggestive of an incompletely visualized decubitus ulcer, with extension of  the fat stranding and fluid to the inferior margin of the left ischium. There is mild focal cortical erosion at the inferior left ischium with associated periosteal reaction, in keeping with osteomyelitis. No presacral fluid or fat stranding. No evidence of a sacral decubitus ulcer. Subcutaneous ventral right lower quadrant spinal stimulator device is noted with lead coursing posteriorly and entering the lumbar spinal canal, with the tip not visualized on this study. No aggressive appearing focal osseous lesions. 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 There is stable diffuse osteopenia. No osseous fracture. No additional focal cortical erosions or periosteal reaction. Partially visualized is bilateral posterior spinal fusion hardware with posterior bilateral fusion rods and interlocking screws extending into the bilateral iliac wings, with no evidence of hardware fracture or loosening. IMPRESSION: 1. Osteomyelitis of the inferior left ischium associated with curvilinear fat stranding, fluid and gas in the posteromedial left gluteal subcutaneous soft tissues, presumably due to a medial left gluteal decubitus ulcer (which is incompletely visualized on this study). No focal drainable fluid collection. 2. No pelvic ascites or intraperitoneal fluid collection. No presacral fluid or inflammatory change. 3. 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 Electronically Signed   By: Ilona Sorrel M.D.   On: 01/01/2015 10:34   Ir Replc Duoden/jejuno Tube Percut W/fluoro  01/01/2015  CLINICAL DATA:  Routine jejunostomy tube exchange EXAM: IR REPLACE DUODEN/JEJUNO TUBE PERCUT WITH FLOURO FLUOROSCOPY TIME:  12 seconds MEDICATIONS AND MEDICAL HISTORY: None ANESTHESIA/SEDATION: None CONTRAST:  5 cc Omnipaque 300 PROCEDURE: The procedure, risks, benefits, and alternatives were explained to the patient. Questions regarding the procedure were encouraged and answered. The patient understands and consents to the procedure. The abdomen was prepped with Betadine in a sterile fashion, and a sterile drape was applied covering the operative field. A sterile gown and sterile gloves were used for the procedure. The existing gastrojejunostomy tube was removed over a stiff glidewire and exchange for a new make jejunostomy tube. The tip was positioned in the proximal jejunum. Contrast was injected. 10 cc saline was utilized to insufflate the balloon. FINDINGS: The jejunostomy tube is been exchange. Tip is in the proximal jejunum. COMPLICATIONS: None IMPRESSION: Successful jejunostomy tube exchange. Electronically Signed   By: Marybelle Killings M.D.   On: 01/01/2015 12:18   Portable Chest 1 View  01/01/2015  CLINICAL DATA:  Shortness of breath, cough, dyspnea.  Cerebral palsy EXAM: PORTABLE CHEST 1 VIEW COMPARISON:  Yesterday FINDINGS: Porta catheter on the right with tip in good position at the SVC level. Persistent hypoventilation and linear opacities at the bases. There is no edema, consolidation, effusion, or pneumothorax. Normal heart size and mediastinal contours. Extensive spinal fixation. IMPRESSION: Chronic hypoventilation with basilar atelectasis or scarring. Electronically Signed   By: Monte Fantasia M.D.   On: 01/01/2015 07:40   Dg Chest Port 1 View  12/31/2014  CLINICAL DATA:  Chest pain recent pneumonia persistent fever for 1 week EXAM:  PORTABLE CHEST 1 VIEW COMPARISON:  10/19/2014 FINDINGS: Spinal rods noted. Port-A-Cath stable on the right. Low lung volumes with bibasilar atelectasis. Heart size normal. IMPRESSION: Mild bilateral lower lobe atelectasis. No definite evidence of pneumonia. Subtle infiltrate could be due obscured by the bilateral lower lobe atelectasis however. Electronically Signed   By: Skipper Cliche M.D.   On: 12/31/2014 17:39     Assessment/Plan: Neutropenic fever Osteomyelitis L ishcium, decubitus ulcer Cerebral palsy  Total days of antibiotics: 2 vanco/zosyn  Would continue his current anbx His fever has improved His ESR is quite elevated.  Aim for 6 weeks of IV anbx due to osteo Would change zosyn to invanz at d/c.  Could consider bone bx for more accurate dx of osteo however he has been on anbx and I am not sure that it be useful.    Culture Result: none  Allergies  Allergen Reactions  . Depakote [Divalproex Sodium]     Causes pancreatitis   . Vimpat [Lacosamide] Rash  . Keppra [Levetiracetam] Other (See Comments)    Bone marrow suppression  . Adhesive [Tape] Other (See Comments)    Rips skin off (paper tape is ok)  . Neulasta [Pegfilgrastim] Other (See Comments)    Fever, tachycardia    Discharge antibiotics: vancomycin, invanz Per pharmacy protocol  Duration: 42 days End Date: February 10, 2014  [facility] Care Per Protocol: Labs weekly while on IV antibiotics: x__ CBC with differential _x_ CMP _x_ CRP _x_ ESR _x_ Vancomycin trough  Fax weekly labs to [phone]  Clinic Follow Up Appt: 6 weeks       [name] Infectious Diseases (pager) [phone] [email or web address] 01/02/2015, 10:53 AM  LOS: 2 days

## 2015-01-02 NOTE — Progress Notes (Signed)
SLP Cancellation Note  Patient Details Name: Jeffery [name] MRN: [id number] DOB: Jan 09, 1991   Cancelled treatment:       Reason Eval/Treat Not Completed: SLP screened, no needs identified, will sign off. Pt does not eat and drink, relies on J-Tube for nutrition. No SLP needs.    Ermalinda Joubert, Katherene Ponto 01/02/2015, 2:45 PM

## 2015-01-02 NOTE — Plan of Care (Signed)
Problem: Safety: Goal: Ability to remain free from injury will improve Outcome: Completed/Met Date Met:  01/02/15 Patient is completely immobilized and is therefore at a low risk for falls. Patient's bed is in the lowest position; RN performing hourly rounding.

## 2015-01-02 NOTE — Progress Notes (Signed)
Notified Ascencion Dike PA that pt now has redness extending from [place], states he will assess. Continue to monitor. Carroll Kinds RN

## 2015-01-02 NOTE — Progress Notes (Signed)
PATIENT DETAILS Name: Jeffery [name] Age: 24 y.o. Sex: male Date of Birth: [birth year] Admit Date: 12/31/2014 Admitting Physician Reyne Dumas, MD [id number], DAVID C, MD  Subjective: Fever improving slowly-no major issues overnight-sleeping comfortably-father at bedside.   Assessment/Plan: Principal Problem: Sepsis: Although could have aspiration pneumonia, likely secondary to osteomyelitis. 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 Given frailty-best managed by empiric Abx-suspect will not tolerate debridement or bone biopsy with sedation/anesthesia. Blood cultures neg so far. Await ID recommendations, but father keen on taking patient home sooner than later-have explained that fever will need to be better controlled first.  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 On chronic BiPAP-taper off oxygen.  ? Aspiration pneumonitis: Current antibiotics should easily cover. But suspect source of fever likely secondary to pelvic osteomyelitis. Follow  Anemia/Leukopenia: Chronic issue-attributed to bone marrow suppression from Keppra and other medications. Received Neupogen on admission, received 2 units of PRBC 11/30. Hb stable at 10.4 and WBC improved at 5.5 today. Follows with oncology/hematology in the outpatient  Hypokalemia:replete and recheck  Dysphagia requring chronic J tube feeds: Continue regular feeds  History of seizures:Continue Onfi and Neurontin.  Hx of cerebral palsy/spastic quadriplegic: Has a intrathecal baclofen pump in place.   [id number] to neurologic issues-on BIPAP Qhs  Disposition: Remain inpatient-home when fever curve better.   Antimicrobial agents  See below  Anti-infectives    Start     Dose/Rate Route Frequency Ordered Stop   01/01/15 0400  vancomycin  (VANCOCIN) IVPB 750 mg/150 ml premix     750 mg 150 mL/hr over 60 Minutes Intravenous Every 12 hours 12/31/14 1601     12/31/14 2200  piperacillin-tazobactam (ZOSYN) IVPB 3.375 g     3.375 g 12.5 mL/hr over 240 Minutes Intravenous Every 8 hours 12/31/14 1601     12/31/14 2030  fluconazole (DIFLUCAN) 40 MG/ML suspension 200 mg  Status:  Discontinued     200 mg Per Tube Daily 12/31/14 2029 12/31/14 2052   12/31/14 1730  levofloxacin (LEVAQUIN) IVPB 750 mg     750 mg 100 mL/hr over 90 Minutes Intravenous Every 24 hours 12/31/14 1726     12/31/14 1600  vancomycin (VANCOCIN) IVPB 1000 mg/200 mL premix     1,000 mg 200 mL/hr over 60 Minutes Intravenous  Once 12/31/14 1539 12/31/14 1819   12/31/14 1600  piperacillin-tazobactam (ZOSYN) IVPB 3.375 g     3.375 g 100 mL/hr over 30 Minutes Intravenous  Once 12/31/14 1539 12/31/14 1650      DVT Prophylaxis: Prophylactic Lovenox   Code Status:  DNR  Family Communication Father at bedside  Procedures: None  CONSULTS:  ID and hematology/oncology  Time spent 30 minutes-Greater than 50% of this time was spent in counseling, explanation of diagnosis, planning of further management, and coordination of care.  MEDICATIONS: Scheduled Meds: . sodium chloride   Intravenous Once  . Bean Aid  2 capsule Per Tube TID WC  . cholecalciferol  1,000 Units Per Tube Daily  . cloBAZam  3.75 mg Per Tube TID  . diazepam  12.5 mg Rectal Once  . diphenhydrAMINE  25 mg Intravenous Once  . enoxaparin (LOVENOX) injection  40 mg Subcutaneous Q24H  . famotidine  40 mg Intravenous Q12H  . feeding supplement (PRO-STAT SUGAR FREE 64)  30 mL Per J Tube TID  .  folic acid  1 mg Per Tube Q24H  . furosemide  20 mg Intravenous Once  . furosemide  20 mg Intravenous Once  . gabapentin  900 mg Per Tube 3 times per day  . gatorade (BH)  180 mL Per Tube 8 times per day  . levalbuterol  1.25 mg Nebulization BID  . levofloxacin (LEVAQUIN) IV  750 mg Intravenous Q24H  .  metoCLOPramide  10 mg Per Tube TID AC & HS  . omeprazole  20 mg Oral BID  . piperacillin-tazobactam (ZOSYN)  IV  3.375 g Intravenous Q8H  . potassium chloride  40 mEq Per Tube Once  . pseudoephedrine  30 mg Per Tube 3 times per day  . sodium chloride  3 mL Intravenous Q12H  . sucralfate  1 g Per Tube 4 times per day  . TWOCAL HN  237 mL Per J Tube TID  . vancomycin  750 mg Intravenous Q12H  . vitamin C  250 mg Per Tube TID  . Zinc 244mg /48ml  244 mg Per Tube BID   Continuous Infusions:  PRN Meds:.acetaminophen (TYLENOL) oral liquid 160 mg/5 mL, diazepam, diphenoxylate-atropine, ipratropium, levalbuterol, ondansetron **OR** ondansetron (ZOFRAN) IV, simethicone    PHYSICAL EXAM: Vital signs in last 24 hours: Filed Vitals:   01/02/15 0234 01/02/15 0305 01/02/15 0400 01/02/15 0450  BP: 94/58 105/67  120/79  Pulse: 80 91 88 94  Temp: 99.1 F (37.3 C)   99.3 F (37.4 C)  TempSrc: Rectal   Oral  Resp: 15 16 12 16   Height:      Weight:    62.052 kg (136 lb 12.8 oz)  SpO2: 100% 99% 98% 100%    Weight change: 7.62 kg (16 lb 12.8 oz) Filed Weights   12/31/14 1904 01/01/15 0419 01/02/15 0450  Weight: 57.97 kg (127 lb 12.8 oz) 57.289 kg (126 lb 4.8 oz) 62.052 kg (136 lb 12.8 oz)   Body mass index is 27.18 kg/(m^2).   Gen Exam: Awake and alert Neck: Supple Chest: B/L Clear anteriorly  CVS: S1 S2 Regular, no murmurs.  Abdomen: soft, BS +, non tender, non distended. G-tube in place Extremities: no edema, lower extremities warm to touch. Neurologic: Decreased bulk-0 X 5 in all 4 ext   Intake/Output from previous day:  Intake/Output Summary (Last 24 hours) at 01/02/15 1059 Last data filed at 01/02/15 0600  Gross per 24 hour  Intake   1819 ml  Output    635 ml  Net   1184 ml     LAB RESULTS: CBC  Recent Labs Lab 12/31/14 1450 01/01/15 0630 01/02/15 0445  WBC 1.1* 1.9* 5.5  HGB 7.0* 6.7* 10.4*  HCT 23.6* 22.9* 33.5*  PLT 137* 151 135*  MCV 109.8* 108.5* 101.2*  MCH  32.6 31.8 31.4  MCHC 29.7* 29.3* 31.0  RDW 18.3* 18.4* 23.0*  LYMPHSABS 0.6*  --   --   MONOABS 0.1  --   --   EOSABS 0.0  --   --   BASOSABS 0.0  --   --     Chemistries   Recent Labs Lab 12/31/14 1450 12/31/14 2330 01/01/15 0630 01/02/15 0445  NA 141  --  140 141  K 4.0  --  3.6 3.1*  CL 100*  --  99* 100*  CO2 34*  --  35* 31  GLUCOSE 96  --  83 82  BUN 23*  --  18 22*  CREATININE 0.51*  --  0.56* 0.59*  CALCIUM 8.3*  --  8.2* 8.4*  MG  --  2.3  --   --     CBG: No results for input(s): GLUCAP in the last 168 hours.  GFR Estimated Creatinine Clearance: 109.2 mL/min (by C-G formula based on Cr of 0.59).  Coagulation profile No results for input(s): INR, PROTIME in the last 168 hours.  Cardiac Enzymes No results for input(s): CKMB, TROPONINI, MYOGLOBIN in the last 168 hours.  Invalid input(s): CK  Invalid input(s): POCBNP No results for input(s): DDIMER in the last 72 hours. No results for input(s): HGBA1C in the last 72 hours. No results for input(s): CHOL, HDL, LDLCALC, TRIG, CHOLHDL, LDLDIRECT in the last 72 hours.  Recent Labs  12/31/14 2330  TSH 2.135    Recent Labs  12/31/14 1450  RETICCTPCT 2.2   No results for input(s): LIPASE, AMYLASE in the last 72 hours.  Urine Studies No results for input(s): UHGB, CRYS in the last 72 hours.  Invalid input(s): UACOL, UAPR, USPG, UPH, UTP, UGL, UKET, UBIL, UNIT, UROB, ULEU, UEPI, UWBC, URBC, UBAC, CAST, UCOM, BILUA  MICROBIOLOGY: Recent Results (from the past 240 hour(s))  Blood Culture (routine x 2)     Status: None (Preliminary result)   Collection Time: 12/31/14  2:51 PM  Result Value Ref Range Status   Specimen Description BLOOD  Final   Special Requests BOTTLES DRAWN AEROBIC AND ANAEROBIC 6CC PORT  Final   Culture NO GROWTH < 24 HOURS  Final   Report Status PENDING  Incomplete  Urine culture     Status: None   Collection Time: 12/31/14  2:54 PM  Result Value Ref Range Status   Specimen  Description URINE, CATHETERIZED  Final   Special Requests NONE  Final   Culture MULTIPLE SPECIES PRESENT, SUGGEST RECOLLECTION  Final   Report Status 01/01/2015 FINAL  Final  Blood Culture (routine x 2)     Status: None (Preliminary result)   Collection Time: 12/31/14  3:50 PM  Result Value Ref Range Status   Specimen Description BLOOD LEFT HAND  Final   Special Requests BOTTLES DRAWN AEROBIC AND ANAEROBIC 5CC  Final   Culture NO GROWTH < 24 HOURS  Final   Report Status PENDING  Incomplete  MRSA PCR Screening     Status: None   Collection Time: 12/31/14  7:17 PM  Result Value Ref Range Status   MRSA by PCR NEGATIVE NEGATIVE Final    Comment:        The GeneXpert MRSA Assay (FDA approved for NASAL specimens only), is one component of a comprehensive MRSA colonization surveillance program. It is not intended to diagnose MRSA infection nor to guide or monitor treatment for MRSA infections.     RADIOLOGY STUDIES/RESULTS: Ct Pelvis Wo Contrast  01/01/2015  CLINICAL DATA:  Cerebral palsy.  Clinical concern for osteomyelitis. EXAM: CT PELVIS WITHOUT CONTRAST TECHNIQUE: Multidetector CT imaging of the pelvis was performed following the standard protocol without intravenous contrast. COMPARISON:  10/15/2014 CT abdomen/pelvis. FINDINGS: Reproductive: Normal prostate and seminal vesicles. Bladder: Collapsed and grossly normal urinary bladder. Visualized ureters are normal caliber. Bowel: Visualized small and large bowel are normal caliber with no bowel wall thickening. Oral contrast traverses to the distal rectum. Partially visualized is an enteric tube in the distal duodenum and proximal jejunum, with the tip not seen on this study. Vascular/Lymphatic: No pathologically enlarged lymph nodes in the pelvis. Other: No pneumoperitoneum, ascites or focal fluid collection. Musculoskeletal: There is curvilinear gas, fat stranding and ill-defined fluid in the posterior  medial left gluteal subcutaneous  soft tissues (series 4/ image 125), suggestive of an incompletely visualized decubitus ulcer, with extension of the fat stranding and fluid to the inferior margin of the left ischium. There is mild focal cortical erosion at the inferior left ischium with associated periosteal reaction, in keeping with osteomyelitis. No presacral fluid or fat stranding. No evidence of a sacral decubitus ulcer. Subcutaneous ventral right lower quadrant spinal stimulator device is noted with lead coursing posteriorly and entering the lumbar spinal canal, with the tip not visualized on this study. No aggressive appearing focal osseous lesions. 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 There is stable diffuse osteopenia. No osseous fracture. No additional focal cortical erosions or periosteal reaction. Partially visualized is bilateral posterior spinal fusion hardware with posterior bilateral fusion rods and interlocking screws extending into the bilateral iliac wings, with no evidence of hardware fracture or loosening. IMPRESSION: 1. Osteomyelitis of the inferior left ischium associated with curvilinear fat stranding, fluid and gas in the posteromedial left gluteal subcutaneous soft tissues, presumably due to a medial left gluteal decubitus ulcer (which is incompletely visualized on this study). No focal drainable fluid collection. 2. No pelvic ascites or intraperitoneal fluid collection. No presacral fluid or inflammatory change. 3. 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 Electronically Signed   By: Ilona Sorrel M.D.   On: 01/01/2015 10:34   Ir Replc Duoden/jejuno Tube Percut W/fluoro  01/01/2015  CLINICAL DATA:  Routine jejunostomy tube exchange EXAM: IR REPLACE DUODEN/JEJUNO TUBE PERCUT WITH FLOURO FLUOROSCOPY TIME:  12 seconds  MEDICATIONS AND MEDICAL HISTORY: None ANESTHESIA/SEDATION: None CONTRAST:  5 cc Omnipaque 300 PROCEDURE: The procedure, risks, benefits, and alternatives were explained to the patient. Questions regarding the procedure were encouraged and answered. The patient understands and consents to the procedure. The abdomen was prepped with Betadine in a sterile fashion, and a sterile drape was applied covering the operative field. A sterile gown and sterile gloves were used for the procedure. The existing gastrojejunostomy tube was removed over a stiff glidewire and exchange for a new make jejunostomy tube. The tip was positioned in the proximal jejunum. Contrast was injected. 10 cc saline was utilized to insufflate the balloon. FINDINGS: The jejunostomy tube is been exchange. Tip is in the proximal jejunum. COMPLICATIONS: None IMPRESSION: Successful jejunostomy tube exchange. Electronically Signed   By: Marybelle Killings M.D.   On: 01/01/2015 12:18   Portable Chest 1 View  01/01/2015  CLINICAL DATA:  Shortness of breath, cough, dyspnea.  Cerebral palsy EXAM: PORTABLE CHEST 1 VIEW COMPARISON:  Yesterday FINDINGS: Porta catheter on the right with tip in good position at the SVC level. Persistent hypoventilation and linear opacities at the bases. There is no edema, consolidation, effusion, or pneumothorax. Normal heart size and mediastinal contours. Extensive spinal fixation. IMPRESSION: Chronic hypoventilation with basilar atelectasis or scarring. Electronically Signed   By: Monte Fantasia M.D.   On: 01/01/2015 07:40   Dg Chest Port 1 View  12/31/2014  CLINICAL DATA:  Chest pain recent pneumonia persistent fever for 1 week EXAM: PORTABLE CHEST 1 VIEW COMPARISON:  10/19/2014 FINDINGS: Spinal rods noted. Port-A-Cath stable on the right. Low lung volumes with bibasilar atelectasis. Heart size normal. IMPRESSION: Mild bilateral lower lobe atelectasis. No definite evidence of pneumonia. Subtle infiltrate could be due obscured  by the bilateral lower lobe atelectasis however. Electronically Signed   By: Kyung Rudd  Rubner M.D.   On: 12/31/2014 17:39    Oren Binet, MD  [facility] Pager:[phone]  If 7PM-7AM, please contact night-coverage [email or web address] Password [email or web address] 01/02/2015, 10:59 AM   LOS: 2 days

## 2015-01-02 NOTE — Progress Notes (Signed)
It looks like Jeffery [name] has osteomyelitis in the pelvis. He had a CT scan done yesterday. This certainly could be why he has the temperatures.  I am not sure if any biopsies need to be done of the left issue him where the abnormality is located. He still has some low-grade temperatures. Per F he got Neupogen yesterday. His white cell count today is 5.5. I do not think he needs an another dose.  He get 2 units of blood yesterday. His hemoglobin is now up to 10.4.  He looks fairly comfortable.  He had his J-tube replaced by radiology. I very much appreciate the outstanding job that they did.  On his physical exam, I really cannot find any specific changes. He has a low-grade temperature of 99.3. His blood pressure is okay at 120/79. Heart rate is 76. His exam is pretty much unchanged from yesterday.  I would assume that he will need a long course of antibiotics for this osteomyelitis. I would think that it probably would be some kind of gram-positive organism given that he has an overlying decubitus ulcer which might be where all this started from.  As always, the care that he is receiving by the staff up on [address] is tremendous!!!  Laurey Arrow e.  Psalm 37:37:4

## 2015-01-02 NOTE — Plan of Care (Signed)
Problem: Physical Regulation: Goal: Ability to maintain clinical measurements within normal limits will improve Outcome: Not Progressing Patient continues to have a fever and is requiring a cooling blanket. Patient received tylenol and ibuprofen this shift to help bring down his fever. Goal: Will remain free from infection Outcome: Not Progressing Patient is currently receiving IV antibiotics to treat his osteomyelitis.   Problem: Skin Integrity: Goal: Risk for impaired skin integrity will decrease Outcome: Not Progressing Patient currently has skin breakdown to bilateral buttocks. Wound care consult has been obtained; wound care is being provided per recommendations.

## 2015-01-03 ENCOUNTER — Telehealth: Payer: Self-pay | Admitting: Neurology

## 2015-01-03 ENCOUNTER — Encounter (HOSPITAL_COMMUNITY): Payer: Self-pay | Admitting: *Deleted

## 2015-01-03 LAB — CBC
HCT: 31.1 % — ABNORMAL LOW (ref 39.0–52.0)
Hemoglobin: 9.5 g/dL — ABNORMAL LOW (ref 13.0–17.0)
MCH: 31.3 pg (ref 26.0–34.0)
MCHC: 30.5 g/dL (ref 30.0–36.0)
MCV: 102.3 fL — AB (ref 78.0–100.0)
PLATELETS: 128 10*3/uL — AB (ref 150–400)
RBC: 3.04 MIL/uL — ABNORMAL LOW (ref 4.22–5.81)
RDW: 22.1 % — AB (ref 11.5–15.5)
WBC: 2.4 10*3/uL — ABNORMAL LOW (ref 4.0–10.5)

## 2015-01-03 LAB — BASIC METABOLIC PANEL
Anion gap: 8 (ref 5–15)
BUN: 23 mg/dL — AB (ref 6–20)
CALCIUM: 8.2 mg/dL — AB (ref 8.9–10.3)
CO2: 32 mmol/L (ref 22–32)
CREATININE: 0.56 mg/dL — AB (ref 0.61–1.24)
Chloride: 100 mmol/L — ABNORMAL LOW (ref 101–111)
GFR calc Af Amer: >60 mL/min (ref >60)
GLUCOSE: 82 mg/dL (ref 65–99)
POTASSIUM: 3.3 mmol/L — AB (ref 3.5–5.1)
SODIUM: 140 mmol/L (ref 135–145)

## 2015-01-03 LAB — URINE CULTURE

## 2015-01-03 LAB — VANCOMYCIN, TROUGH: VANCOMYCIN TR: 14 ug/mL (ref 10.0–20.0)

## 2015-01-03 MED ORDER — SODIUM CHLORIDE 0.9 % IV SOLN: 1.0000 g | INTRAVENOUS | Status: DC

## 2015-01-03 MED ORDER — DIPHENHYDRAMINE HCL 50 MG/ML IJ SOLN
25.0000 mg | Freq: Once | INTRAMUSCULAR | Status: AC
  Administered 2015-01-03: 25 mg via INTRAVENOUS
  Filled 2015-01-03: qty 1

## 2015-01-03 MED ORDER — POTASSIUM CHLORIDE 20 MEQ/15ML (10%) PO SOLN
40.0000 meq | Freq: Once | ORAL | Status: AC
  Administered 2015-01-03: 40 meq
  Filled 2015-01-03: qty 30

## 2015-01-03 MED ORDER — FUROSEMIDE 10 MG/ML IJ SOLN
20.0000 mg | Freq: Once | INTRAMUSCULAR | Status: AC
  Administered 2015-01-03: 20 mg via INTRAVENOUS
  Filled 2015-01-03: qty 2

## 2015-01-03 MED ORDER — ERTAPENEM SODIUM 1 G IJ SOLR
1.0000 g | Freq: Once | INTRAMUSCULAR | Status: DC
  Filled 2015-01-03: qty 1

## 2015-01-03 MED ORDER — VANCOMYCIN HCL IN DEXTROSE 1-5 GM/200ML-% IV SOLN
1000.0000 mg | Freq: Two times a day (BID) | INTRAVENOUS | Status: DC
  Administered 2015-01-03 – 2015-01-06 (×6): 1000 mg via INTRAVENOUS
  Filled 2015-01-03 (×8): qty 200

## 2015-01-03 MED ORDER — MUPIROCIN CALCIUM 2 % EX CREA
TOPICAL_CREAM | CUTANEOUS | Status: DC | PRN
  Filled 2015-01-03: qty 15

## 2015-01-03 MED ORDER — NONFORMULARY OR COMPOUNDED ITEM
2.0000 | Freq: Three times a day (TID) | Status: DC
  Administered 2015-01-03 – 2015-01-07 (×6): 2

## 2015-01-03 MED ORDER — VITAMIN C 500 MG/5ML PO SYRP
250.0000 mg | ORAL_SOLUTION | Freq: Three times a day (TID) | ORAL | Status: DC
  Administered 2015-01-03 – 2015-01-04 (×2): 250 mg
  Filled 2015-01-03 (×9): qty 2.5

## 2015-01-03 MED ORDER — VANCOMYCIN HCL IN DEXTROSE 1-5 GM/200ML-% IV SOLN: 1000.0000 mg | Freq: Two times a day (BID) | INTRAVENOUS | Status: DC

## 2015-01-03 MED ORDER — SODIUM CHLORIDE 0.9 % IV SOLN
1.0000 g | Freq: Once | INTRAVENOUS | Status: AC
  Administered 2015-01-03: 1 g via INTRAVENOUS
  Filled 2015-01-03: qty 1

## 2015-01-03 MED ORDER — FILGRASTIM 480 MCG/1.6ML IJ SOLN
480.0000 ug | Freq: Once | INTRAMUSCULAR | Status: AC
  Administered 2015-01-03: 480 ug via SUBCUTANEOUS
  Filled 2015-01-03: qty 1.6

## 2015-01-03 NOTE — Procedures (Signed)
Arrived to assist pt respiratory status.  Pt on treatment. No distress noted at this time.  Due to this occurrence, RT suggested placing the pt on BiPAP for the evening to rest the pt.  Pt's father declined to have pt placed on BiPAP.  Father wishes to treat any further distress with a treatment first and wishes to only place the pt on BiPAP if absolutely necessary. RT will continue to monitor the pt.

## 2015-01-03 NOTE — Discharge Summary (Addendum)
PATIENT DETAILS Name: Jeffery [name] Age: 24 y.o. Sex: male Date of Birth: 19-Jan-1991 MRN: [id number]. Admitting Physician: Reyne Dumas, MD HUT:[id number], DAVID C, MD  Admit Date: 12/31/2014 Discharge date: 01/07/2015  Recommendations for Outpatient Follow-up:  Antibioitic end date-Jan 10,2017,Labs biweekly for 2 weeks and then weekly after that- while on IV antibiotics: CBC with differential CMP CRP ESR Vancomycin random Fax labs to [phone]  Ensure follow up with ID-Dr Johnnye Sima Do not stop IV Antibiotics until directed by Inf Disease MD Follow blood cultures (11/29) till final  PRIMARY DISCHARGE DIAGNOSIS:  Principal Problem:   Sepsis ([place]) Active Problems:   Dysphagia requring chronic J tube feeds   Neuromuscular scoliosis   Generalized convulsive epilepsy ([place])   OSA (obstructive sleep apnea)   Cerebral palsy, quadriplegic (HCC)   Pressure ulcer   Osteomyelitis (HCC)   Elevated liver enzymes   Malnutrition ([place])      PAST MEDICAL HISTORY: Past Medical History  Diagnosis Date  . CP (cerebral palsy), spastic, quadriplegic ([place])   . Osteoporosis   . Undescended testes   . Seasonal allergies   . IVH (intraventricular hemorrhage) ([place]) Aug 14, 1990    Grade IV  . Hip dislocation, bilateral ([place])   . Dysphagia   . Retinopathy of prematurity   . 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 Neuromuscular scoliosis   . Osteoporosis   . Complex partial seizures ([place])   . Generalized convulsive epilepsy without mention of intractable epilepsy   . Sinus bradycardia     HR drops to 38-40 while sleeping  . Blister of right heel     fluid filled; origin unknown  . Kidney stones     ?  Marland Kitchen Pneumonia      chronic pneumonia ,respitory failure dx Augest 2014  . Aspiration pneumonia ([place])     "chronic" (04/12/2014)  . OSA treated with BiPAP     "since age 54"   . Anemia   . 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 GERD (gastroesophageal reflux disease)   . Epilepsy ([place])   . 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 Neutropenia ([place]) 07/03/2014    DISCHARGE MEDICATIONS: Current Discharge Medication List    START taking these medications   Details  ertapenem 1 g in sodium chloride 0.9 % 50 mL Inject 1 g into the vein daily. Antibioitic end date-Jan 10,2017  Labs weekly while on IV antibiotics: x__ CBC with differential _x_ CMP _x_ CRP _x_ ESR _x_ Vancomycin trough  Fax weekly labs to [phone] Qty: 42 Syringe, Refills: 0    glycopyrrolate (ROBINUL) 1 MG tablet Place 1 tablet (1 mg total) into feeding tube 3 (three) times daily as needed (excessive secretions). Qty: 30 tablet, Refills: 0      CONTINUE these medications which have NOT CHANGED   Details  acetaminophen (TYLENOL) 160 MG/5ML solution 500 mg by Gastric Tube route every 6 (six) hours as needed for moderate pain or fever.     acetaminophen (TYLENOL) 325 MG suppository Place 650 mg rectally every 6 (six) hours as needed for fever.   Associated Diagnoses: Anemia of chronic disease; Pancytopenia (HCC)    albuterol (PROVENTIL HFA;VENTOLIN HFA) 108 (90 BASE) MCG/ACT inhaler Inhale 2 puffs into the lungs every 4 (four) hours as needed for shortness of breath.    albuterol (PROVENTIL) (2.5 MG/3ML) 0.083% nebulizer solution Take 2.5 mg by nebulization See admin instructions. Give 1 vial (  2.5 mg) twice daily (8am and 8pm) and every 4 hours as needed for shortness of breath or wheezing    baclofen (GABLOFEN) 40000 MCG/20ML SOLN by Intrathecal route continuous. 385.2 mcg in 24 hours    bag balm OINT ointment Apply 1 application topically See admin instructions. Apply to sacral area with each diaper change    calcium carbonate, dosed in mg elemental calcium, 1250 MG/5ML 1,250 mg by Gastric Tube route 3 (three) times daily. 8am , 2pm, and 8pm    Cholecalciferol (VITAMIN D3) 400 UNIT/ML LIQD Give 2.5 mLs by tube daily. Refills: 5      DIASTAT ACUDIAL 20 MG GEL Place 12.5 mg rectally as needed (for seizure lasting 2 minutes or longer or repetitive seizures - no more than 1 dose in 12 hours (not given with nasal versed)).  Refills: 5    dicyclomine (BENTYL) 10 MG/5ML syrup 10 mg by Gastric Tube route every 8 (eight) hours as needed (abdominal cramps). Do not exceed 5 days in one week    diphenhydrAMINE (BENADRYL) 12.5 MG/5ML liquid Take 25 mg by mouth 4 (four) times daily as needed (30 min before Neupogen).    diphenoxylate-atropine (LOMOTIL) 2.5-0.025 MG/5ML liquid 10 mLs by Gastric Tube route every 6 (six) hours as needed for diarrhea or loose stools.     filgrastim (NEUPOGEN) 300 MCG/ML injection Inject 1 mL (300 mcg total) into the skin once a week. Qty: 4 mL, Refills: 6   Associated Diagnoses: Acquired pancytopenia (HCC)    folic acid (FOLVITE) 1 MG tablet 1 mg by Gastric Tube route daily. 8:00am per G tube    furosemide (LASIX) 10 MG/ML solution 20 mg by Per J Tube route daily as needed for fluid or edema.     gabapentin (NEURONTIN) 250 MG/5ML solution Take 18 mL 3 times daily Qty: 1620 mL, Refills: 5   Associated Diagnoses: Hip pain, unspecified laterality    Heparin Lock Flush (HEPARIN FLUSH, PORCINE,) 100 UNIT/ML injection 5 mLs (500 Units total) by Intracatheter route as needed (Prior to de-accessing port). Qty: 100 Syringe, Refills: 1   Associated Diagnoses: Portacath in place    ibuprofen (ADVIL,MOTRIN) 100 MG/5ML suspension 400 mg by Gastric Tube route every 6 (six) hours as needed for fever (pain).     ketoconazole (NIZORAL) 2 % cream Apply 1 application topically 2 (two) times daily as needed for irritation.    lidocaine (XYLOCAINE) 5 % ointment APPLY AS DIRECTED 30 minutes PRIOR TO BLOOD DRAWS Refills: 1    metoCLOPramide (REGLAN) 5 MG/5ML solution Place 10 mLs (10 mg total) into feeding tube 4 (four) times daily -  before meals and at bedtime. Qty: 240 mL, Refills: 3   Associated Diagnoses:  Gastroesophageal reflux disease without esophagitis    midazolam (VERSED) 5 MG/ML injection Place 2 mLs (10 mg total) into the nose once. Draw up 50m in 2 syringes. Remove blue vial access device. Attach syringe to nasal atomizer for intranasal administration. Give 140min right nostril x 2 for seizures lasting 2 minutes or longer or for repetitive seizures in a short period of time. Qty: 6 mL, Refills: 3    Multiple Vitamin (MULTIVITAMIN) LIQD 10 mLs by Gastric Tube route 2 (two) times daily.     Multiple Vitamins-Minerals (ZINC PO) 5 mLs by Gastric Tube route 2 (two) times daily. 244 mg / 5 mL zinc solution compounded by [facility]    mupirocin ointment (BACTROBAN) 2 % Apply 1 application topically as needed (to G-T site).  Qty: 22 g, Refills: 1    !! Nutritional Supplements (PROMOD) LIQD Take 40 mLs by mouth 2 (two) times daily. 8am and 5pm    !! Nutritional Supplements (TWOCAL HN) LIQD 237 mLs by Per J Tube route See admin instructions. T.3 can at 46 cc hour x 12 hours - Gatorade 172 cc pre and post each and extra 172 bid.    omeprazole (PRILOSEC) 2 mg/mL SUSP Take 20 mg by mouth 2 (two) times daily.    OnabotulinumtoxinA (BOTOX IJ) Inject as directed See admin instructions. 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    OXYGEN-HELIUM IN Inhale 3 L into the lungs as needed (o2 at 90%). Oxygen PRN to keep O2 Sat at 90%    potassium chloride (KLOR-CON) 20 MEQ packet Give 20 meq daily except twice daily with lasix, given through J port Qty: 1 packet, Refills: 5    Pseudoephedrine HCl (SUDAFED  CHILDRENS) 15 MG/5ML LIQD 30 mg by Gastric Tube route 3 (three) times daily. 8am, 2pm, 8pm    sodium chloride 0.9 % injection Flush port after use with 10cc NS. Disp# 10 - 10cc syringes Qty: 1000 mL, Refills: 1   Associated Diagnoses: Portacath in place    sodium phosphate (FLEET) enema Place 1 enema rectally daily as needed (gas buildup/ bloating).     sucralfate (CARAFATE) 1 GM/10ML suspension 1 g by Gastric Tube route 4 (four) times daily. 7am, 12pm, 5pm, 7pm Administer alone 30 minutes prior to other medications.    Zinc Oxide (BALMEX EX) Apply 1 application topically See admin instructions. Apply to sacral with each diaper change    ZINC SULFATE PO Place 244 mg into feeding tube 2 (two) times daily.    Amino Acids-Protein Hydrolys (FEEDING SUPPLEMENT, PRO-STAT SUGAR FREE 64,) LIQD Take 30 mLs by mouth 2 (two) times daily. Qty: 900 mL, Refills: 0     !! - Potential duplicate medications found. Please discuss with provider.    STOP taking these medications     baclofen (LIORESAL) 10 MG tablet      levofloxacin (LEVAQUIN) 25 MG/ML solution      ascorbic acid (VITAMIN C) 500 MG/5ML syrup      fluconazole (DIFLUCAN) 40 MG/ML suspension      amoxicillin-clavulanate (AUGMENTIN) 600-42.9 MG/5ML suspension      simethicone (MYLICON) 1540MG chewable tablet         ALLERGIES:   Allergies  Allergen Reactions  . Depakote [Divalproex Sodium]     Causes pancreatitis   . Vimpat [Lacosamide] Rash  . Keppra [Levetiracetam] Other (See Comments)    Bone marrow suppression  . Adhesive [Tape] Other (See Comments)    Rips skin off (paper tape is ok)  . Neulasta [Pegfilgrastim] Other (See Comments)    Fever, tachycardia    BRIEF HPI:  See H&P, Labs, Consult and Test reports for all details in bbrief,24y.o. male, with cerebral palsy, spastic quadriplegia, generalized convulsive epilepsy, pancytopenia, obstructive sleep apnea on BiPAP at night, and recurrent aspiration pneumonia  (PEG/J tube in place)-was admitted for evaluation of fever  CONSULTATIONS:   ID  PERTINENT RADIOLOGIC  STUDIES: Ct Pelvis Wo Contrast  01/01/2015  CLINICAL DATA:  Cerebral palsy.  Clinical concern for osteomyelitis. EXAM: CT PELVIS WITHOUT CONTRAST TECHNIQUE: Multidetector CT imaging of the pelvis was performed following the standard protocol without intravenous contrast. COMPARISON:  10/15/2014 CT abdomen/pelvis. FINDINGS: Reproductive: Normal prostate and seminal vesicles. Bladder: Collapsed and grossly normal urinary bladder. Visualized ureters are normal caliber. Bowel: Visualized small and large bowel are normal caliber with no bowel wall thickening. Oral contrast traverses to the distal rectum. Partially visualized is an enteric tube in the distal duodenum and proximal jejunum, with the tip not seen on this study. Vascular/Lymphatic: No pathologically enlarged lymph nodes in the pelvis. Other: No pneumoperitoneum, ascites or focal fluid collection. Musculoskeletal: There is curvilinear gas, fat stranding and ill-defined fluid in the posterior medial left gluteal subcutaneous soft tissues (series 4/ image 125), suggestive of an incompletely visualized decubitus ulcer, with extension of the fat stranding and fluid to the inferior margin of the left ischium. There is mild focal cortical erosion at the inferior left ischium with associated periosteal reaction, in keeping with osteomyelitis. No presacral fluid or fat stranding. No evidence of a sacral decubitus ulcer. Subcutaneous ventral right lower quadrant spinal stimulator device is noted with lead coursing posteriorly and entering the lumbar spinal canal, with the tip not visualized on this study. No aggressive appearing focal osseous lesions. 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 There is  stable diffuse osteopenia. No osseous fracture. No additional focal cortical erosions or periosteal reaction. Partially visualized is bilateral posterior spinal fusion hardware with posterior bilateral fusion rods and interlocking screws extending into the bilateral iliac wings, with no evidence of hardware fracture or loosening. IMPRESSION: 1. Osteomyelitis of the inferior left ischium associated with curvilinear fat stranding, fluid and gas in the posteromedial left gluteal subcutaneous soft tissues, presumably due to a medial left gluteal decubitus ulcer (which is incompletely visualized on this study). No focal drainable fluid collection. 2. No pelvic ascites or intraperitoneal fluid collection. No presacral fluid or inflammatory change. 3. 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 Electronically Signed   By: Ilona Sorrel M.D.   On: 01/01/2015 10:34   Ir Replc Duoden/jejuno Tube Percut W/fluoro  01/01/2015  CLINICAL DATA:  Routine jejunostomy tube exchange EXAM: IR REPLACE DUODEN/JEJUNO TUBE PERCUT WITH FLOURO FLUOROSCOPY TIME:  12 seconds MEDICATIONS AND MEDICAL HISTORY: None ANESTHESIA/SEDATION: None CONTRAST:  5 cc Omnipaque 300 PROCEDURE: The procedure, risks, benefits, and alternatives were explained to the patient. Questions regarding the procedure were encouraged and answered. The patient understands and consents to the procedure. The abdomen was prepped with Betadine in a sterile fashion, and a sterile drape was applied covering the operative field. A sterile gown and sterile gloves were used for the procedure. The existing gastrojejunostomy tube was removed over a stiff glidewire and exchange for a new make jejunostomy tube. The tip was positioned in the proximal jejunum. Contrast was injected. 10 cc saline was utilized to insufflate the balloon. FINDINGS: The jejunostomy tube is been exchange. Tip is in the proximal jejunum. COMPLICATIONS: None IMPRESSION: Successful  jejunostomy tube exchange. Electronically Signed   By: Marybelle Killings M.D.   On: 01/01/2015 12:18   Dg Chest Port 1 View  01/04/2015  CLINICAL DATA:  Shortness of breath and fever.  Recent pneumonia. EXAM: PORTABLE CHEST 1 VIEW COMPARISON:  01/01/2015 and 12/31/2014. FINDINGS: 0814 hours. There  are chronic low lung volumes. The patient's mandible overlies the right lung apex. Chronic bibasilar atelectasis or scarring is unchanged. There is no airspace disease, edema or pleural effusion. Right subclavian Port-A-Cath appears unchanged. The bones appear unchanged status post thoracolumbar fusion. IMPRESSION: No significant change in chronic bibasilar atelectasis or scarring. No acute findings demonstrated. Electronically Signed   By: Richardean Sale M.D.   On: 01/04/2015 10:29   Portable Chest 1 View  01/01/2015  CLINICAL DATA:  Shortness of breath, cough, dyspnea.  Cerebral palsy EXAM: PORTABLE CHEST 1 VIEW COMPARISON:  Yesterday FINDINGS: Porta catheter on the right with tip in good position at the SVC level. Persistent hypoventilation and linear opacities at the bases. There is no edema, consolidation, effusion, or pneumothorax. Normal heart size and mediastinal contours. Extensive spinal fixation. IMPRESSION: Chronic hypoventilation with basilar atelectasis or scarring. Electronically Signed   By: Monte Fantasia M.D.   On: 01/01/2015 07:40   Dg Chest Port 1 View  12/31/2014  CLINICAL DATA:  Chest pain recent pneumonia persistent fever for 1 week EXAM: PORTABLE CHEST 1 VIEW COMPARISON:  10/19/2014 FINDINGS: Spinal rods noted. Port-A-Cath stable on the right. Low lung volumes with bibasilar atelectasis. Heart size normal. IMPRESSION: Mild bilateral lower lobe atelectasis. No definite evidence of pneumonia. Subtle infiltrate could be due obscured by the bilateral lower lobe atelectasis however. Electronically Signed   By: Skipper Cliche M.D.   On: 12/31/2014 17:39   US Abdomen Limited Ruq  01/05/2015   CLINICAL DATA:  Elevated liver function tests. Cerebral palsy. Feeding tube and implanted pain pump in the right upper quadrant of the abdomen overlying the gallbladder area. EXAM: US ABDOMEN LIMITED - RIGHT UPPER QUADRANT COMPARISON:  Limited abdomen ultrasound dated 11/06/2014. Abdomen CT dated 10/15/2014. FINDINGS: Gallbladder: Multiple gallstones in the gallbladder measuring up to 7 mm in maximum diameter each. No gallbladder wall thickening or pericholecystic fluid. No apparent pain over the gallbladder. Common bile duct: Diameter: 3.8 mm Liver: Normal echogenicity.  No mass or biliary ductal dilatation seen. IMPRESSION: 1. Cholelithiasis without evidence cholecystitis. 2. Otherwise, unremarkable examination. Electronically Signed   By: Claudie Revering M.D.   On: 01/05/2015 15:59     PERTINENT LAB RESULTS: CBC:  Recent Labs  01/06/15 0540 01/07/15 0450  WBC 6.7 4.2  HGB 10.5* 10.1*  HCT 34.9* 34.1*  PLT 134* 123*   CMET CMP     Component Value Date/Time   NA 143 01/07/2015 0450   NA 138 11/08/2014 0828   K 4.1 01/07/2015 0450   K 4.1 11/08/2014 0828   CL 106 01/07/2015 0450   CL 101 11/08/2014 0828   CO2 23 01/07/2015 0450   CO2 30 11/08/2014 0828   GLUCOSE 71 01/07/2015 0450   GLUCOSE 97 11/08/2014 0828   BUN 29* 01/07/2015 0450   BUN 16 11/08/2014 0828   CREATININE 1.36* 01/07/2015 0450   CREATININE 0.6 11/08/2014 0828   CALCIUM 8.5* 01/07/2015 0450   CALCIUM 9.7 11/08/2014 0828   PROT 4.7* 01/06/2015 0540   PROT 6.2* 11/08/2014 0828   ALBUMIN 1.9* 01/06/2015 0540   ALBUMIN 3.1* 11/08/2014 0828   AST 59* 01/06/2015 0540   AST 40* 11/08/2014 0828   ALT 107* 01/06/2015 0540   ALT 69* 11/08/2014 0828   ALKPHOS 859* 01/06/2015 0540   ALKPHOS 147* 11/08/2014 0828   BILITOT 1.0 01/06/2015 0540   BILITOT 0.50 11/08/2014 0828   GFRNONAA >60 01/07/2015 0450   GFRAA >60 01/07/2015 0450    GFR Estimated  Creatinine Clearance: 63.3 mL/min (by C-G formula based on Cr of  1.36). No results for input(s): LIPASE, AMYLASE in the last 72 hours. No results for input(s): CKTOTAL, CKMB, CKMBINDEX, TROPONINI in the last 72 hours. Invalid input(s): POCBNP No results for input(s): DDIMER in the last 72 hours. No results for input(s): HGBA1C in the last 72 hours. No results for input(s): CHOL, HDL, LDLCALC, TRIG, CHOLHDL, LDLDIRECT in the last 72 hours. No results for input(s): TSH, T4TOTAL, T3FREE, THYROIDAB in the last 72 hours.  Invalid input(s): FREET3 No results for input(s): VITAMINB12, FOLATE, FERRITIN, TIBC, IRON, RETICCTPCT in the last 72 hours. Coags: No results for input(s): INR in the last 72 hours.  Invalid input(s): PT Microbiology: Recent Results (from the past 240 hour(s))  Blood Culture (routine x 2)     Status: None   Collection Time: 12/31/14  2:51 PM  Result Value Ref Range Status   Specimen Description BLOOD PORTA CATH  Final   Special Requests BOTTLES DRAWN AEROBIC AND ANAEROBIC 6CC   Final   Culture NO GROWTH 5 DAYS  Final   Report Status 01/05/2015 FINAL  Final  Urine culture     Status: None   Collection Time: 12/31/14  2:54 PM  Result Value Ref Range Status   Specimen Description URINE, CATHETERIZED  Final   Special Requests NONE  Final   Culture MULTIPLE SPECIES PRESENT, SUGGEST RECOLLECTION  Final   Report Status 01/01/2015 FINAL  Final  Blood Culture (routine x 2)     Status: None   Collection Time: 12/31/14  3:50 PM  Result Value Ref Range Status   Specimen Description BLOOD LEFT HAND  Final   Special Requests BOTTLES DRAWN AEROBIC AND ANAEROBIC 5CC  Final   Culture NO GROWTH 5 DAYS  Final   Report Status 01/05/2015 FINAL  Final  MRSA PCR Screening     Status: None   Collection Time: 12/31/14  7:17 PM  Result Value Ref Range Status   MRSA by PCR NEGATIVE NEGATIVE Final    Comment:        The GeneXpert MRSA Assay (FDA approved for NASAL specimens only), is one component of a comprehensive MRSA colonization surveillance  program. It is not intended to diagnose MRSA infection nor to guide or monitor treatment for MRSA infections.   Urine culture     Status: None   Collection Time: 01/02/15  5:14 AM  Result Value Ref Range Status   Specimen Description URINE, RANDOM  Final   Special Requests NONE  Final   Culture 10,000 COLONIES/mL YEAST  Final   Report Status 01/03/2015 FINAL  Final  C difficile quick scan w PCR reflex     Status: None   Collection Time: 01/04/15  4:01 PM  Result Value Ref Range Status   C Diff antigen NEGATIVE NEGATIVE Final   C Diff toxin NEGATIVE NEGATIVE Final   C Diff interpretation Negative for toxigenic C. difficile  Final     BRIEF HOSPITAL COURSE:  Sepsis: Suspect secondary to aspiration pneumonitis, and left inferior history of osteomyelitis. 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 Given frailty-best managed by empiric Abx-suspect will not tolerate debridement or bone biopsy with sedation/anesthesia. Blood cultures neg so far. Seen by infectious disease-was on IV vancomycin and Zosyn during this hospital stay, blood cultures remain negative, fever curve has been significantly better for the past 24 hours. Hospital course was also complicated by development of elevated liver enzymes-especially alkaline phosphatase-patient underwent a RUQ ultrasound  which was negative for any significant biliary or hepatobiliary pathology.Patient's parents are very keen on taking him home, they wanted to take him home over the past few days but he was not stable enough. Furthermore, family does not desire interventions or procedures at this time and feels that patient would be best served by being around loved ones and in familiar settings. Patient already gets significant home health support including nursing care and lab work that is faxed to his PCP/hematologist. Since he will be going home on IV antibiotics, we have arranged for home health RN for IV antibiotics. Although  recommendations from infectious disease were to continue IV vancomycin and ertapenem through 02/11/15.Patient unfortunately, patient had supratherapeutic vancomycin levels on 12/5 (54) with mildly elevated creatinine. Patient's discharge was held 12/6, creatinine only marginally elevated this morning at 1.36. Spoke with infectious disease-Dr. Hatcher-recommendations are to discontinue vancomycin, patient has already had around 8 days of intravenous vancomycin while hospitalized, suspect with such high vancomycin levels-he will probably have vancomycin in the system for another week-he probably does not need any further IV vancomycin-specialist since his cultures are all negative. Therefore we will plan on only continuing IV Invanz on discharge-this will need to be continued through 02/11/15.Patient will need  labs-faxed over to the infectious disease clinic (see above). Also spoke with patient's PCP-Dr. Jobe Igo over the phone and apprised him of the above plan-per PCP-it has been advised that the patient's mother not access the Port-A-Cath (unless for emergencies-seizures) for other purposes-like IV antibiotics-he prefers the home health agency access the Port-A-Cath. Patient's Mother is aware-of this issue.  Active Problems: Left inferior ischium Osteomyelitis with sacral wounds (stage 3-present prior to admission):See above regarding antibiotics.Wound care RN evaluated the patient-recommendations are to continue with foam dressings and topical treatment.  Acute on chronic hypoxic respiratory failure: Improved-suspect secondary to atelectasis/aspiration pneumonia. On chronic BiPAP/O2 at home.  Aspiration pneumonitis: Current antibiotics should easily cover. Hospital course was complicated by development of pooling of secretions causing respiratory distress. This was managed with IV Robinul, suctioning and other supportive measures.  Elevated liver enzymes: Predominantly alkaline phosphatase-etiology remains  unknown. RUQ ultrasound negative for biliary pathology-no cholecystitis, no CBD dilatation or biliary dilatation seen. Not sure exactly what's causing these elevations. In any event, patient's family wants to take him home, did not want any further investigations-as they do not want any interventions/procedures or aggressive care. We will get weekly blood draws at home in any event. Per PCP- this tends to occur with Neupogen-but numbers were certainly not this high.   Mild ARF: Suspect from Supratherapeutic Vancomycin level.  Patient will get bi-weekly lab work for 2 weeks at family's request and then switched to weekly-renal function will be monitored by home health/PCP. As noted above-patient's family is very keen to take patient home, and do not want any aggressive interventions/procedures/aggressive care/Dialysis etc.   Anemia/Leukopenia: Chronic issue-attributed to bone marrow suppression from [place] and other medications. Received Neupogen on admission, received 2 units of PRBC 11/30. Hb stable at 10.1 improved at 4.2 today. Follows with oncology/hematology in the outpatient.  Hypokalemia:replete and recheck. Will get weekly labs at home.   Dysphagia requring chronic J tube feeds: Continue regular feeds  History of seizures:Continue Onfi and Neurontin.Ensure follow up with Dr Gaynell Face  Hx of cerebral palsy/spastic quadriplegic: Has a intrathecal baclofen pump in place.   HTX:HFSFSEL to neurologic issues-on BIPAP Qhs  TODAY-DAY OF DISCHARGE:  Subjective:   Natividad Brood today has no fever for the past 24 hours.  No major issues overnight  Objective:   Blood pressure 118/77, pulse 98, temperature 97.7 F (36.5 C), temperature source Axillary, resp. rate 16, height 4' 11.5" (1.511 m), weight 60.147 kg (132 lb 9.6 oz), SpO2 100 %.  Intake/Output Summary (Last 24 hours) at 01/07/15 1051 Last data filed at 01/07/15 1046  Gross per 24 hour  Intake 1165.5 ml  Output    650 ml  Net  515.5  ml   Filed Weights   01/04/15 0458 01/05/15 1206 01/06/15 0518  Weight: 57.289 kg (126 lb 4.8 oz) 58.968 kg (130 lb) 60.147 kg (132 lb 9.6 oz)    Exam Awake Alert, Oriented *3, No new F.N deficits, Normal affect [place].AT,PERRAL Supple Neck,No JVD, No cervical lymphadenopathy appriciated.  Symmetrical Chest wall movement, Good air movement bilaterally, CTAB RRR,No Gallops,Rubs or new Murmurs, No Parasternal Heave +ve B.Sounds, Abd Soft, Non tender, No organomegaly appriciated, No rebound -guarding or rigidity. No Cyanosis, Clubbing or edema, No new Rash or bruise  DISCHARGE CONDITION: Stable  DISPOSITION: Home with home health services  DISCHARGE INSTRUCTIONS:    Activity:  As tolerated  Get Medicines reviewed and adjusted: Please take all your medications with you for your next visit with your Primary MD  Please request your Primary MD to go over all hospital tests and procedure/radiological results at the follow up, please ask your Primary MD to get all Hospital records sent to his/her office.  If you experience worsening of your admission symptoms, develop shortness of breath, life threatening emergency, suicidal or homicidal thoughts you must seek medical attention immediately by calling 911 or calling your MD immediately  if symptoms less severe.  You must read complete instructions/literature along with all the possible adverse reactions/side effects for all the Medicines you take and that have been prescribed to you. Take any new Medicines after you have completely understood and accpet all the possible adverse reactions/side effects.   Do not drive when taking Pain medications.   Do not take more than prescribed Pain, Sleep and Anxiety Medications  Special Instructions: If you have smoked or chewed Tobacco  in the last 2 yrs please stop smoking, stop any regular Alcohol  and or any Recreational drug use.  Wear Seat belts while driving.  Please note  You were cared for  by a hospitalist during your hospital stay. Once you are discharged, your primary care physician will handle any further medical issues. Please note that NO REFILLS for any discharge medications will be authorized once you are discharged, as it is imperative that you return to your primary care physician (or establish a relationship with a primary care physician if you do not have one) for your aftercare needs so that they can reassess your need for medications and monitor your lab values.   Diet recommendation: NPO  Discharge Instructions    Call MD for:  temperature >100.4    Complete by:  As directed      Increase activity slowly    Complete by:  As directed            Follow-up Information    Follow up with [name], DAVID C, MD. Schedule an appointment as soon as possible for a visit in 1 week.   Specialty:  Internal Medicine   Contact information:   [address] [place]  [address] [phone]       Follow up with Jodi Geralds, MD.   Specialties:  Pediatrics, Radiology   Why:  keep next  apointment   Contact information:   [address] [place] [address] [phone]       Follow up with [name], MD. Schedule an appointment as soon as possible for a visit in 3 weeks.   Specialty:  Infectious Diseases   Why:  Hospital follow up   Contact information:   [place] [address] [phone]       Follow up with [facility].   Specialty:  [place]   Why:  H/H Registered Nurse and IV antibiotics via [place].    Contact information:   [place] [address] [phone]       Total Time spent on discharge equals 45 minutes.  SignedOren Binet 01/07/2015 10:51 AM

## 2015-01-03 NOTE — Plan of Care (Signed)
Problem: Pain Managment: Goal: General experience of comfort will improve Outcome: Completed/Met Date Met:  01/03/15 Patient does not appear to be in any distress. Patient has been resting comfortably. CPOT tool used to assess pain.

## 2015-01-03 NOTE — Discharge Instructions (Signed)
Follow with Primary MD  [name], DAVID C, MD  and Dr Gaynell Face, Dr Johnnye Sima (ID)  Please get a complete blood count and chemistry panel checked by your Primary MD at your next visit, and again as instructed by your Primary MD.  Get Medicines reviewed and adjusted. Please take all your medications with you for your next visit with your Primary MD  Please request your Primary MD to go over all hospital tests and procedure/radiological results at the follow up, please ask your Primary MD to get all Hospital records sent to his/her office.  If you experience worsening of your admission symptoms, develop shortness of breath, life threatening emergency, suicidal or homicidal thoughts you must seek medical attention immediately by calling 911 or calling your MD immediately  if symptoms less severe.  You must read complete instructions/literature along with all the possible adverse reactions/side effects for all the Medicines you take and that have been prescribed to you. Take any new Medicines after you have completely understood and accpet all the possible adverse reactions/side effects.   Do not drive when taking Pain medications or sleeping medications (Benzodaizepines)  Do not take more than prescribed Pain, Sleep and Anxiety Medications  Special Instructions: If you have smoked or chewed Tobacco  in the last 2 yrs please stop smoking, stop any regular Alcohol  and or any Recreational drug use.  Wear Seat belts while driving.  Please note  You were cared for by a hospitalist during your hospital stay. Once you are discharged, your primary care physician will handle any further medical issues. Please note that NO REFILLS for any discharge medications will be authorized once you are discharged, as it is imperative that you return to your primary care physician (or establish a relationship with a primary care physician if you do not have one) for your aftercare needs so that they can reassess your need  for medications and monitor your lab values.

## 2015-01-03 NOTE — Telephone Encounter (Signed)
Called patients caregiver and left a VM regarding r/s botox apt.

## 2015-01-03 NOTE — Procedures (Signed)
RT called due to pt increase work of breathing.  Clinical decision to place pt on BiPAP.

## 2015-01-03 NOTE — Progress Notes (Addendum)
ANTIBIOTIC CONSULT NOTE - FOLLOW UP  Pharmacy Consult for Vancomycin and Zosyn Indication: osteomyelitis L ischium, decub ulcer  Allergies  Allergen Reactions  . Depakote [Divalproex Sodium]     Causes pancreatitis   . Vimpat [Lacosamide] Rash  . Keppra [Levetiracetam] Other (See Comments)    Bone marrow suppression  . Adhesive [Tape] Other (See Comments)    Rips skin off (paper tape is ok)  . Neulasta [Pegfilgrastim] Other (See Comments)    Fever, tachycardia    Patient Measurements: Height: 4' 11.5" (151.1 cm) Weight: 126 lb 12.8 oz (57.516 kg) IBW/kg (Calculated) : 48.85  Vital Signs: Temp: 97.9 F (36.6 C) (12/02 0400) Temp Source: Axillary (12/02 0400) BP: 108/67 mmHg (12/02 0441) Pulse Rate: 108 (12/02 0441) Intake/Output from previous day: 12/01 0701 - 12/02 0700 In: 560  Out: 950 [Urine:950] Intake/Output from this shift: Total I/O In: 560 [Other:560] Out: 200 [Urine:200]  Labs:  Recent Labs  12/31/14 1450 01/01/15 0630 01/02/15 0445 01/03/15 0435  WBC 1.1* 1.9* 5.5  --   HGB 7.0* 6.7* 10.4*  --   PLT 137* 151 135*  --   CREATININE 0.51* 0.56* 0.59* 0.56*   Estimated Creatinine Clearance: 98.5 mL/min (by C-G formula based on Cr of 0.56).  Recent Labs  01/03/15 0454  [facility] 14     Microbiology: Recent Results (from the past 720 hour(s))  Blood Culture (routine x 2)     Status: None (Preliminary result)   Collection Time: 12/31/14  2:51 PM  Result Value Ref Range Status   Specimen Description BLOOD  Final   Special Requests BOTTLES DRAWN AEROBIC AND ANAEROBIC 6CC PORT  Final   Culture NO GROWTH 2 DAYS  Final   Report Status PENDING  Incomplete  Urine culture     Status: None   Collection Time: 12/31/14  2:54 PM  Result Value Ref Range Status   Specimen Description URINE, CATHETERIZED  Final   Special Requests NONE  Final   Culture MULTIPLE SPECIES PRESENT, SUGGEST RECOLLECTION  Final   Report Status 01/01/2015 FINAL  Final  Blood  Culture (routine x 2)     Status: None (Preliminary result)   Collection Time: 12/31/14  3:50 PM  Result Value Ref Range Status   Specimen Description BLOOD LEFT HAND  Final   Special Requests BOTTLES DRAWN AEROBIC AND ANAEROBIC 5CC  Final   Culture NO GROWTH 2 DAYS  Final   Report Status PENDING  Incomplete  MRSA PCR Screening     Status: None   Collection Time: 12/31/14  7:17 PM  Result Value Ref Range Status   MRSA by PCR NEGATIVE NEGATIVE Final    Comment:        The GeneXpert MRSA Assay (FDA approved for NASAL specimens only), is one component of a comprehensive MRSA colonization surveillance program. It is not intended to diagnose MRSA infection nor to guide or monitor treatment for MRSA infections.     Anti-infectives    Start     Dose/Rate Route Frequency Ordered Stop   01/02/15 1700  vancomycin (VANCOCIN) IVPB 750 mg/150 ml premix     750 mg 150 mL/hr over 60 Minutes Intravenous Every 12 hours 01/02/15 1259     01/01/15 0400  vancomycin (VANCOCIN) IVPB 750 mg/150 ml premix  Status:  Discontinued     750 mg 150 mL/hr over 60 Minutes Intravenous Every 12 hours 12/31/14 1601 01/02/15 1259   12/31/14 2200  piperacillin-tazobactam (ZOSYN) IVPB 3.375 g  3.375 g 12.5 mL/hr over 240 Minutes Intravenous Every 8 hours 12/31/14 1601     12/31/14 2030  fluconazole (DIFLUCAN) 40 MG/ML suspension 200 mg  Status:  Discontinued     200 mg Per Tube Daily 12/31/14 2029 12/31/14 2052   12/31/14 1730  levofloxacin (LEVAQUIN) IVPB 750 mg  Status:  Discontinued     750 mg 100 mL/hr over 90 Minutes Intravenous Every 24 hours 12/31/14 1726 01/02/15 1257   12/31/14 1600  vancomycin (VANCOCIN) IVPB 1000 mg/200 mL premix     1,000 mg 200 mL/hr over 60 Minutes Intravenous  Once 12/31/14 1539 12/31/14 1819   12/31/14 1600  piperacillin-tazobactam (ZOSYN) IVPB 3.375 g     3.375 g 100 mL/hr over 30 Minutes Intravenous  Once 12/31/14 1539 12/31/14 1650      Assessment: 24 y.o. 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 Plan for 6 weeks of IV Vancomycin and change Zosyn to Invanz upon discharge. Afeb. WBC 2.4 (chronic neutropenia).   Vancomycin trough 14 mcg/ml (slightly subtherapeutic) on 750mg  IV q12h. Trough drawn appropriately per RN even though lab and dose hung times off in computer (see her progress note).  Goal of Therapy:  Vancomycin trough level 15-20 mcg/ml  Plan:  Change Vancomycin to 1000mg  IV q12h Will f/u trough at Css of new dose. If pt is discharged, consider first trough in 3-4 days to verify that higher dose not accumulating. Continue Zosyn 3.375gm IV q8h Will continue to follow  Sherlon Handing, PharmD, BCPS Clinical pharmacist, pager [phone] 01/03/2015,5:36 AM

## 2015-01-03 NOTE — Plan of Care (Signed)
Problem: Activity: Goal: Risk for activity intolerance will decrease Outcome: Completed/Met Date Met:  01/03/15 Patient is immobile at his baseline. Patient has been getting up into his wheelchair while in the hospital as he does at home.

## 2015-01-03 NOTE — Plan of Care (Signed)
Problem: Physical Regulation: Goal: Ability to maintain clinical measurements within normal limits will improve Outcome: Completed/Met Date Met:  01/03/15 Patient no longer has a fever; cooling blanket and rectal probe have been removed. All other vital signs WNL.

## 2015-01-03 NOTE — Progress Notes (Signed)
Patient on 2L [place]

## 2015-01-03 NOTE — Progress Notes (Signed)
Pt mother at bedside, states pt may possibly be starting to third space, and noticed skin is red at belly area. RN notified MD, received new orders. Pt will be discharged 12/3. Pt is on bipap for resting, and tube feeding has been restarted. Will continue to monitor. Etta Quill, RN

## 2015-01-03 NOTE — Progress Notes (Signed)
Vancomycin trough level was drawn from patient's port-a-cath prior to the 0500 vancomycin dose being hung as per pharmacy orders.

## 2015-01-03 NOTE — Progress Notes (Signed)
Xopenex treatment administered for increasing SOB with coarse rhonchi and expiratory wheezes throughout. RR 32 O2 sat 94%

## 2015-01-03 NOTE — Progress Notes (Signed)
Pt on Bi-Pap resting.

## 2015-01-03 NOTE — Procedures (Signed)
Pt's mother previously stated that she would have the RN contact the RT when ready to have BiPAP placed on the pt.  Because the RT had yet to be contact, RT checked in on the pt.  Pt's mother refuses to have the BiPAP placed and instead wanted a treatment to be given.  RT complied.  No distress noted with pt and pt resting comfortably on 3L [place].

## 2015-01-04 ENCOUNTER — Inpatient Hospital Stay (HOSPITAL_COMMUNITY): Payer: Medicaid Other

## 2015-01-04 DIAGNOSIS — M86652 Other chronic osteomyelitis, left thigh: Secondary | ICD-10-CM

## 2015-01-04 DIAGNOSIS — R609 Edema, unspecified: Secondary | ICD-10-CM

## 2015-01-04 LAB — CBC
HEMATOCRIT: 37.2 % — AB (ref 39.0–52.0)
Hemoglobin: 11.1 g/dL — ABNORMAL LOW (ref 13.0–17.0)
MCH: 31.1 pg (ref 26.0–34.0)
MCHC: 29.8 g/dL — AB (ref 30.0–36.0)
MCV: 104.2 fL — AB (ref 78.0–100.0)
Platelets: 156 10*3/uL (ref 150–400)
RBC: 3.57 MIL/uL — ABNORMAL LOW (ref 4.22–5.81)
RDW: 21.2 % — AB (ref 11.5–15.5)
WBC: 7.1 10*3/uL (ref 4.0–10.5)

## 2015-01-04 LAB — C DIFFICILE QUICK SCREEN W PCR REFLEX
C Diff antigen: NEGATIVE
C Diff interpretation: NEGATIVE
C Diff toxin: NEGATIVE

## 2015-01-04 LAB — BASIC METABOLIC PANEL
Anion gap: 10 (ref 5–15)
BUN: 23 mg/dL — AB (ref 6–20)
CHLORIDE: 103 mmol/L (ref 101–111)
CO2: 29 mmol/L (ref 22–32)
Calcium: 8.4 mg/dL — ABNORMAL LOW (ref 8.9–10.3)
Creatinine, Ser: 0.68 mg/dL (ref 0.61–1.24)
GFR calc Af Amer: >60 mL/min (ref >60)
GFR calc non Af Amer: >60 mL/min (ref >60)
GLUCOSE: 85 mg/dL (ref 65–99)
POTASSIUM: 4 mmol/L (ref 3.5–5.1)
Sodium: 142 mmol/L (ref 135–145)

## 2015-01-04 MED ORDER — GLYCOPYRROLATE 0.2 MG/ML IJ SOLN
0.1000 mg | Freq: Four times a day (QID) | INTRAMUSCULAR | Status: DC | PRN
  Administered 2015-01-05: 0.1 mg via INTRAVENOUS
  Filled 2015-01-04 (×2): qty 0.5

## 2015-01-04 MED ORDER — FILGRASTIM 480 MCG/1.6ML IJ SOLN
480.0000 ug | Freq: Once | INTRAMUSCULAR | Status: AC
  Administered 2015-01-04: 480 ug via SUBCUTANEOUS
  Filled 2015-01-04: qty 1.6

## 2015-01-04 MED ORDER — LEVALBUTEROL HCL 1.25 MG/0.5ML IN NEBU
1.2500 mg | INHALATION_SOLUTION | Freq: Two times a day (BID) | RESPIRATORY_TRACT | Status: DC
  Administered 2015-01-04 – 2015-01-07 (×6): 1.25 mg via RESPIRATORY_TRACT
  Filled 2015-01-04 (×6): qty 0.5

## 2015-01-04 MED ORDER — PIPERACILLIN-TAZOBACTAM 3.375 G IVPB
3.3750 g | Freq: Three times a day (TID) | INTRAVENOUS | Status: DC
  Administered 2015-01-04 – 2015-01-07 (×8): 3.375 g via INTRAVENOUS
  Filled 2015-01-04 (×14): qty 50

## 2015-01-04 NOTE — Progress Notes (Signed)
INFECTIOUS DISEASE PROGRESS NOTE  ID: Jeffery [name] is a 24 y.o. male with  Principal Problem:   Sepsis ([place]) Active Problems:   Dysphagia requring chronic J tube feeds   Neuromuscular scoliosis   Generalized convulsive epilepsy ([place])   OSA (obstructive sleep apnea)   Cerebral palsy, quadriplegic ([facility])   Pressure ulcer   Osteomyelitis ([facility])  Subjective: Increase work of breathing, fever anbx changed back to zosyn from [employer] with caregiver- "he is 90% of what he is like at home" Attributes his current state to anxiety attacks, temps to neupogen (which he gets temps to at home) Having "blowout" bowel movements  Abtx:  Anti-infectives    Start     Dose/Rate Route Frequency Ordered Stop   01/04/15 0845  piperacillin-tazobactam (ZOSYN) IVPB 3.375 g     3.375 g 12.5 mL/hr over 240 Minutes Intravenous 3 times per day 01/04/15 0840     01/03/15 1700  vancomycin (VANCOCIN) IVPB 1000 mg/200 mL premix     1,000 mg 200 mL/hr over 60 Minutes Intravenous Every 12 hours 01/03/15 1032     01/03/15 1200  ertapenem [facility]) injection 1 g  Status:  Discontinued     1 g Intramuscular  Once 01/03/15 1123 01/03/15 1138   01/03/15 1200  ertapenem (INVANZ) 1 g in sodium chloride 0.9 % 50 mL IVPB     1 g 100 mL/hr over 30 Minutes Intravenous  Once 01/03/15 1138 01/03/15 1317   01/03/15 0000  vancomycin (VANCOCIN) 1 GM/200ML SOLN     1,000 mg 200 mL/hr over 60 Minutes Intravenous Every 12 hours 01/03/15 1100     01/03/15 0000  ertapenem 1 g in sodium chloride 0.9 % 50 mL  Status:  Discontinued     1 g 100 mL/hr over 30 Minutes Intravenous Every 24 hours 01/03/15 1100 01/03/15    01/03/15 0000  ertapenem 1 g in sodium chloride 0.9 % 50 mL     1 g 100 mL/hr over 30 Minutes Intravenous Every 24 hours 01/03/15 1104     01/02/15 1700  vancomycin (VANCOCIN) IVPB 750 mg/150 ml premix  Status:  Discontinued     750 mg 150 mL/hr over 60 Minutes Intravenous Every 12 hours 01/02/15 1259  01/03/15 1032   01/01/15 0400  vancomycin (VANCOCIN) IVPB 750 mg/150 ml premix  Status:  Discontinued     750 mg 150 mL/hr over 60 Minutes Intravenous Every 12 hours 12/31/14 1601 01/02/15 1259   12/31/14 2200  piperacillin-tazobactam (ZOSYN) IVPB 3.375 g  Status:  Discontinued     3.375 g 12.5 mL/hr over 240 Minutes Intravenous Every 8 hours 12/31/14 1601 01/03/15 1123   12/31/14 2030  fluconazole (DIFLUCAN) 40 MG/ML suspension 200 mg  Status:  Discontinued     200 mg Per Tube Daily 12/31/14 2029 12/31/14 2052   12/31/14 1730  levofloxacin (LEVAQUIN) IVPB 750 mg  Status:  Discontinued     750 mg 100 mL/hr over 90 Minutes Intravenous Every 24 hours 12/31/14 1726 01/02/15 1257   12/31/14 1600  vancomycin (VANCOCIN) IVPB 1000 mg/200 mL premix     1,000 mg 200 mL/hr over 60 Minutes Intravenous  Once 12/31/14 1539 12/31/14 1819   12/31/14 1600  piperacillin-tazobactam (ZOSYN) IVPB 3.375 g     3.375 g 100 mL/hr over 30 Minutes Intravenous  Once 12/31/14 1539 12/31/14 1650      Medications:  Scheduled: . sodium chloride   Intravenous Once  . ascorbic acid  250 mg Per  Tube TID  . cholecalciferol  1,000 Units Per Tube Daily  . cloBAZam  3.75 mg Per Tube TID  . diazepam  12.5 mg Rectal Once  . diphenhydrAMINE  25 mg Intravenous Once  . enoxaparin (LOVENOX) injection  40 mg Subcutaneous Q24H  . famotidine  40 mg Intravenous Q12H  . feeding supplement (PRO-STAT SUGAR FREE 64)  30 mL Per J Tube TID  . filgrastim  480 mcg Subcutaneous Once  . folic acid  1 mg Per Tube Q24H  . furosemide  20 mg Intravenous Once  . gabapentin  900 mg Per Tube 3 times per day  . gatorade (BH)  180 mL Per Tube 8 times per day  . levalbuterol  1.25 mg Nebulization BID  . metoCLOPramide  10 mg Per Tube TID AC & HS  . NONFORMULARY OR COMPOUNDED ITEM 2 capsule  2 capsule Per Tube TID WC  . omeprazole  20 mg Oral BID  . piperacillin-tazobactam (ZOSYN)  IV  3.375 g Intravenous 3 times per day  . pseudoephedrine   30 mg Per Tube 3 times per day  . sodium chloride  3 mL Intravenous Q12H  . sucralfate  1 g Per Tube 4 times per day  . TWOCAL HN  237 mL Per J Tube TID  . vancomycin  1,000 mg Intravenous Q12H  . Zinc 244mg /74ml  244 mg Per Tube BID    Objective: Vital signs in last 24 hours: Temp:  [98.1 F (36.7 C)-101.7 F (38.7 C)] 101.7 F (38.7 C) (12/03 0828) Pulse Rate:  [102-126] 121 (12/03 0828) Resp:  [23-32] 28 (12/03 0828) BP: (100-128)/(62-97) 128/76 mmHg (12/03 0828) SpO2:  [92 %-100 %] 92 % (12/03 0828) FiO2 (%):  [35 %] 35 % (12/02 1332) Weight:  [57.289 kg (126 lb 4.8 oz)] 57.289 kg (126 lb 4.8 oz) (12/03 0458)   General appearance: fatigued and no distress Resp: rhonchi bilaterally Cardio: regular rate and rhythm GI: normal findings: bowel sounds normal and soft, non-tender and abnormal findings:  distended Extremities: wasting  Lab Results  Recent Labs  01/02/15 0445 01/03/15 0435  WBC 5.5 2.4*  HGB 10.4* 9.5*  HCT 33.5* 31.1*  NA 141 140  K 3.1* 3.3*  CL 100* 100*  CO2 31 32  BUN 22* 23*  CREATININE 0.59* 0.56*   Liver Panel No results for input(s): PROT, ALBUMIN, AST, ALT, ALKPHOS, BILITOT, BILIDIR, IBILI in the last 72 hours. Sedimentation Rate  Recent Labs  01/01/15 1307  ESRSEDRATE 90*   C-Reactive Protein  Recent Labs  01/01/15 1307  CRP 0.7    Microbiology: Recent Results (from the past 240 hour(s))  Blood Culture (routine x 2)     Status: None (Preliminary result)   Collection Time: 12/31/14  2:51 PM  Result Value Ref Range Status   Specimen Description BLOOD PORTA CATH  Final   Special Requests BOTTLES DRAWN AEROBIC AND ANAEROBIC 6CC   Final   Culture NO GROWTH 3 DAYS  Final   Report Status PENDING  Incomplete  Urine culture     Status: None   Collection Time: 12/31/14  2:54 PM  Result Value Ref Range Status   Specimen Description URINE, CATHETERIZED  Final   Special Requests NONE  Final   Culture MULTIPLE SPECIES PRESENT, SUGGEST  RECOLLECTION  Final   Report Status 01/01/2015 FINAL  Final  Blood Culture (routine x 2)     Status: None (Preliminary result)   Collection Time: 12/31/14  3:50 PM  Result  Value Ref Range Status   Specimen Description BLOOD LEFT HAND  Final   Special Requests BOTTLES DRAWN AEROBIC AND ANAEROBIC 5CC  Final   Culture NO GROWTH 3 DAYS  Final   Report Status PENDING  Incomplete  MRSA PCR Screening     Status: None   Collection Time: 12/31/14  7:17 PM  Result Value Ref Range Status   MRSA by PCR NEGATIVE NEGATIVE Final    Comment:        The GeneXpert MRSA Assay (FDA approved for NASAL specimens only), is one component of a comprehensive MRSA colonization surveillance program. It is not intended to diagnose MRSA infection nor to guide or monitor treatment for MRSA infections.   Urine culture     Status: None   Collection Time: 01/02/15  5:14 AM  Result Value Ref Range Status   Specimen Description URINE, RANDOM  Final   Special Requests NONE  Final   Culture 10,000 COLONIES/mL YEAST  Final   Report Status 01/03/2015 FINAL  Final    Studies/Results: No results found.   Assessment/Plan: Neutropenic fever Osteomyelitis L ishcium, decubitus ulcer Cerebral palsy  Total days of antibiotics: 4 vanco/zosyn  Would repeat his labs- CBC, CMP Repeat BCx (esp in light of his port) Await reading of his CXR (he is at high risk for aspiration). He is currently covered very broadly with his anbx He is on LMWH on MAR, compression devices as well. Will defer to primary if he needs PE eval.  Drug reaction to carbapenem, vanco seems less likely.  Will check C diff         [name] Infectious Diseases (pager) [phone] [email or web address] 01/04/2015, 10:15 AM  LOS: 4 days

## 2015-01-04 NOTE — Progress Notes (Signed)
PATIENT DETAILS Name: Jeffery [name] Age: 24 y.o. Sex: male Date of Birth: 1990/05/04 Admit Date: 12/31/2014 Admitting Physician Reyne Dumas, MD [id number], DAVID C, MD  Subjective: Fever reoccurred this morning. Per RN-had issues with secretions pooling last evening and last night-requiring nasal suctioning.   Assessment/Plan: Principal Problem: Sepsis: Likely secondary to osteomyelitis. However given his issues with secretions/dysphagia-could have chemical.aspiration pneumonitis. 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 Given frailty-best managed by empiric Abx-suspect will not tolerate debridement or bone biopsy with sedation/anesthesia. Unfortunately continues to be febrile this morning-suspect from ongoing chemical/aspiration pneumonitis given issues with secretion last night. Reconsult infectious disease-continue vancomycin and Zosyn. Checking C. difficile PCR.  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 On chronic BiPAP-taper off oxygen.  ? Aspiration pneumonitis: Had issues with pooling of secretions yesterday evening/last night-persistently afebrile this morning-suspect ongoing aspiration pneumonitis.Current antibiotics should cover. Follow closely.   Anemia/Leukopenia: Chronic issue-attributed to bone marrow suppression from Keppra and other medications. On Neupogen, received 2 units of PRBC 11/30. Hb stable at 11.1 and WBC improved at 5.5 today. Follows with oncology/hematology in the outpatient  Hypokalemia:replete and recheck  Dysphagia requring chronic J tube feeds: Continue regular feeds. Have discussed with pharmacy to see if there is major interaction with Robinul-if not-start to try to control secretions.   History of seizures:Continue Onfi and Neurontin.  Hx of  cerebral palsy/spastic quadriplegic: Has a intrathecal baclofen pump in place.   [id number] to neurologic issues-on BIPAP Qhs  Goals of care: Discussed with patient's mother at bedside-she is aware of limitations-patient is established DO NOT RESUSCITATE-plans are to continue with aggressive care to control fever/sepsis. She wants to hold off on palliative care consultation for symptom management for now and reassess him tomorrow morning  Disposition: Remain inpatient-home when fever curve better.   Antimicrobial agents  See below  Anti-infectives    Start     Dose/Rate Route Frequency Ordered Stop   01/04/15 0845  piperacillin-tazobactam (ZOSYN) IVPB 3.375 g     3.375 g 12.5 mL/hr over 240 Minutes Intravenous 3 times per day 01/04/15 0840     01/03/15 1700  vancomycin (VANCOCIN) IVPB 1000 mg/200 mL premix     1,000 mg 200 mL/hr over 60 Minutes Intravenous Every 12 hours 01/03/15 1032     01/03/15 1200  ertapenem [facility]) injection 1 g  Status:  Discontinued     1 g Intramuscular  Once 01/03/15 1123 01/03/15 1138   01/03/15 1200  ertapenem (INVANZ) 1 g in sodium chloride 0.9 % 50 mL IVPB     1 g 100 mL/hr over 30 Minutes Intravenous  Once 01/03/15 1138 01/03/15 1317   01/03/15 0000  vancomycin (VANCOCIN) 1 GM/200ML SOLN     1,000 mg 200 mL/hr over 60 Minutes Intravenous Every 12 hours 01/03/15 1100     01/03/15 0000  ertapenem 1 g in sodium chloride 0.9 % 50 mL  Status:  Discontinued     1 g 100 mL/hr over 30 Minutes Intravenous Every 24 hours 01/03/15 1100 01/03/15    01/03/15 0000  ertapenem 1 g in sodium chloride 0.9 % 50 mL     1 g 100 mL/hr over 30 Minutes Intravenous Every 24 hours 01/03/15 1104     01/02/15 1700  vancomycin (VANCOCIN) IVPB 750 mg/150 ml premix  Status:  Discontinued  750 mg 150 mL/hr over 60 Minutes Intravenous Every 12 hours 01/02/15 1259 01/03/15 1032   01/01/15 0400  vancomycin (VANCOCIN) IVPB 750 mg/150 ml premix  Status:  Discontinued     750  mg 150 mL/hr over 60 Minutes Intravenous Every 12 hours 12/31/14 1601 01/02/15 1259   12/31/14 2200  piperacillin-tazobactam (ZOSYN) IVPB 3.375 g  Status:  Discontinued     3.375 g 12.5 mL/hr over 240 Minutes Intravenous Every 8 hours 12/31/14 1601 01/03/15 1123   12/31/14 2030  fluconazole (DIFLUCAN) 40 MG/ML suspension 200 mg  Status:  Discontinued     200 mg Per Tube Daily 12/31/14 2029 12/31/14 2052   12/31/14 1730  levofloxacin (LEVAQUIN) IVPB 750 mg  Status:  Discontinued     750 mg 100 mL/hr over 90 Minutes Intravenous Every 24 hours 12/31/14 1726 01/02/15 1257   12/31/14 1600  vancomycin (VANCOCIN) IVPB 1000 mg/200 mL premix     1,000 mg 200 mL/hr over 60 Minutes Intravenous  Once 12/31/14 1539 12/31/14 1819   12/31/14 1600  piperacillin-tazobactam (ZOSYN) IVPB 3.375 g     3.375 g 100 mL/hr over 30 Minutes Intravenous  Once 12/31/14 1539 12/31/14 1650      DVT Prophylaxis: Prophylactic Lovenox   Code Status:  DNR  Family Communication Father at bedside  Procedures: None  CONSULTS:  ID and hematology/oncology  Time spent 30 minutes-Greater than 50% of this time was spent in counseling, explanation of diagnosis, planning of further management, and coordination of care.  MEDICATIONS: Scheduled Meds: . sodium chloride   Intravenous Once  . ascorbic acid  250 mg Per Tube TID  . cholecalciferol  1,000 Units Per Tube Daily  . cloBAZam  3.75 mg Per Tube TID  . diazepam  12.5 mg Rectal Once  . enoxaparin (LOVENOX) injection  40 mg Subcutaneous Q24H  . famotidine  40 mg Intravenous Q12H  . feeding supplement (PRO-STAT SUGAR FREE 64)  30 mL Per J Tube TID  . folic acid  1 mg Per Tube Q24H  . furosemide  20 mg Intravenous Once  . gabapentin  900 mg Per Tube 3 times per day  . gatorade (BH)  180 mL Per Tube 8 times per day  . levalbuterol  1.25 mg Nebulization BID  . metoCLOPramide  10 mg Per Tube TID AC & HS  . NONFORMULARY OR COMPOUNDED ITEM 2 capsule  2 capsule Per  Tube TID WC  . omeprazole  20 mg Oral BID  . piperacillin-tazobactam (ZOSYN)  IV  3.375 g Intravenous 3 times per day  . pseudoephedrine  30 mg Per Tube 3 times per day  . sodium chloride  3 mL Intravenous Q12H  . sucralfate  1 g Per Tube 4 times per day  . TWOCAL HN  237 mL Per J Tube TID  . vancomycin  1,000 mg Intravenous Q12H  . Zinc 244mg /7ml  244 mg Per Tube BID   Continuous Infusions:  PRN Meds:.acetaminophen (TYLENOL) oral liquid 160 mg/5 mL, diazepam, diphenoxylate-atropine, ibuprofen, ipratropium, levalbuterol, mupirocin cream, ondansetron **OR** ondansetron (ZOFRAN) IV, simethicone    PHYSICAL EXAM: Vital signs in last 24 hours: Filed Vitals:   01/04/15 0828 01/04/15 1150 01/04/15 1207 01/04/15 1216  BP: 128/76   120/78  Pulse: 121   118  Temp: 101.7 F (38.7 C) 102.1 F (38.9 C) 101.8 F (38.8 C)   TempSrc: Rectal Rectal Rectal   Resp: 28   23  Height:      Weight:  SpO2: 92%   99%    Weight change: -0.227 kg (-8 oz) Filed Weights   01/02/15 0450 01/03/15 0400 01/04/15 0458  Weight: 62.052 kg (136 lb 12.8 oz) 57.516 kg (126 lb 12.8 oz) 57.289 kg (126 lb 4.8 oz)   Body mass index is 25.09 kg/(m^2).   Gen Exam: Awake and alert-flushed this morning-not in any distress. Neck: Supple Chest: B/L Clear anteriorly (just finished a bronchodilator treatment) CVS: S1 S2 Regular, no murmurs.  Abdomen: soft, BS +, non tender, non distended. G-tube in place-very minimal erythema around the G-tube site Extremities: no edema, lower extremities warm to touch. Neurologic: Decreased bulk-0 X 5 in all 4 ext   Intake/Output from previous day:  Intake/Output Summary (Last 24 hours) at 01/04/15 1301 Last data filed at 01/04/15 0000  Gross per 24 hour  Intake   1323 ml  Output    975 ml  Net    348 ml     LAB RESULTS: CBC  Recent Labs Lab 12/31/14 1450 01/01/15 0630 01/02/15 0445 01/03/15 0435 01/04/15 1120  WBC 1.1* 1.9* 5.5 2.4* 7.1  HGB 7.0* 6.7* 10.4*  9.5* 11.1*  HCT 23.6* 22.9* 33.5* 31.1* 37.2*  PLT 137* 151 135* 128* 156  MCV 109.8* 108.5* 101.2* 102.3* 104.2*  MCH 32.6 31.8 31.4 31.3 31.1  MCHC 29.7* 29.3* 31.0 30.5 29.8*  RDW 18.3* 18.4* 23.0* 22.1* 21.2*  LYMPHSABS 0.6*  --   --   --   --   MONOABS 0.1  --   --   --   --   EOSABS 0.0  --   --   --   --   BASOSABS 0.0  --   --   --   --     Chemistries   Recent Labs Lab 12/31/14 1450 12/31/14 2330 01/01/15 0630 01/02/15 0445 01/03/15 0435 01/04/15 1120  NA 141  --  140 141 140 142  K 4.0  --  3.6 3.1* 3.3* 4.0  CL 100*  --  99* 100* 100* 103  CO2 34*  --  35* 31 32 29  GLUCOSE 96  --  83 82 82 85  BUN 23*  --  18 22* 23* 23*  CREATININE 0.51*  --  0.56* 0.59* 0.56* 0.68  CALCIUM 8.3*  --  8.2* 8.4* 8.2* 8.4*  MG  --  2.3  --   --   --   --     CBG: No results for input(s): GLUCAP in the last 168 hours.  GFR Estimated Creatinine Clearance: 98.5 mL/min (by C-G formula based on Cr of 0.68).  Coagulation profile No results for input(s): INR, PROTIME in the last 168 hours.  Cardiac Enzymes No results for input(s): CKMB, TROPONINI, MYOGLOBIN in the last 168 hours.  Invalid input(s): CK  Invalid input(s): POCBNP No results for input(s): DDIMER in the last 72 hours. No results for input(s): HGBA1C in the last 72 hours. No results for input(s): CHOL, HDL, LDLCALC, TRIG, CHOLHDL, LDLDIRECT in the last 72 hours. No results for input(s): TSH, T4TOTAL, T3FREE, THYROIDAB in the last 72 hours.  Invalid input(s): FREET3 No results for input(s): VITAMINB12, FOLATE, FERRITIN, TIBC, IRON, RETICCTPCT in the last 72 hours. No results for input(s): LIPASE, AMYLASE in the last 72 hours.  Urine Studies No results for input(s): UHGB, CRYS in the last 72 hours.  Invalid input(s): UACOL, UAPR, USPG, UPH, UTP, UGL, UKET, UBIL, UNIT, UROB, ULEU, UEPI, UWBC, URBC, UBAC, CAST, UCOM, BILUA  MICROBIOLOGY:  Recent Results (from the past 240 hour(s))  Blood Culture (routine x 2)      Status: None (Preliminary result)   Collection Time: 12/31/14  2:51 PM  Result Value Ref Range Status   Specimen Description BLOOD PORTA CATH  Final   Special Requests BOTTLES DRAWN AEROBIC AND ANAEROBIC 6CC   Final   Culture NO GROWTH 3 DAYS  Final   Report Status PENDING  Incomplete  Urine culture     Status: None   Collection Time: 12/31/14  2:54 PM  Result Value Ref Range Status   Specimen Description URINE, CATHETERIZED  Final   Special Requests NONE  Final   Culture MULTIPLE SPECIES PRESENT, SUGGEST RECOLLECTION  Final   Report Status 01/01/2015 FINAL  Final  Blood Culture (routine x 2)     Status: None (Preliminary result)   Collection Time: 12/31/14  3:50 PM  Result Value Ref Range Status   Specimen Description BLOOD LEFT HAND  Final   Special Requests BOTTLES DRAWN AEROBIC AND ANAEROBIC 5CC  Final   Culture NO GROWTH 3 DAYS  Final   Report Status PENDING  Incomplete  MRSA PCR Screening     Status: None   Collection Time: 12/31/14  7:17 PM  Result Value Ref Range Status   MRSA by PCR NEGATIVE NEGATIVE Final    Comment:        The GeneXpert MRSA Assay (FDA approved for NASAL specimens only), is one component of a comprehensive MRSA colonization surveillance program. It is not intended to diagnose MRSA infection nor to guide or monitor treatment for MRSA infections.   Urine culture     Status: None   Collection Time: 01/02/15  5:14 AM  Result Value Ref Range Status   Specimen Description URINE, RANDOM  Final   Special Requests NONE  Final   Culture 10,000 COLONIES/mL YEAST  Final   Report Status 01/03/2015 FINAL  Final    RADIOLOGY STUDIES/RESULTS: Ct Pelvis Wo Contrast  01/01/2015  CLINICAL DATA:  Cerebral palsy.  Clinical concern for osteomyelitis. EXAM: CT PELVIS WITHOUT CONTRAST TECHNIQUE: Multidetector CT imaging of the pelvis was performed following the standard protocol without intravenous contrast. COMPARISON:  10/15/2014 CT abdomen/pelvis. FINDINGS:  Reproductive: Normal prostate and seminal vesicles. Bladder: Collapsed and grossly normal urinary bladder. Visualized ureters are normal caliber. Bowel: Visualized small and large bowel are normal caliber with no bowel wall thickening. Oral contrast traverses to the distal rectum. Partially visualized is an enteric tube in the distal duodenum and proximal jejunum, with the tip not seen on this study. Vascular/Lymphatic: No pathologically enlarged lymph nodes in the pelvis. Other: No pneumoperitoneum, ascites or focal fluid collection. Musculoskeletal: There is curvilinear gas, fat stranding and ill-defined fluid in the posterior medial left gluteal subcutaneous soft tissues (series 4/ image 125), suggestive of an incompletely visualized decubitus ulcer, with extension of the fat stranding and fluid to the inferior margin of the left ischium. There is mild focal cortical erosion at the inferior left ischium with associated periosteal reaction, in keeping with osteomyelitis. No presacral fluid or fat stranding. No evidence of a sacral decubitus ulcer. Subcutaneous ventral right lower quadrant spinal stimulator device is noted with lead coursing posteriorly and entering the lumbar spinal canal, with the tip not visualized on this study. No aggressive appearing focal osseous lesions. 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 There is stable diffuse osteopenia. No  osseous fracture. No additional focal cortical erosions or periosteal reaction. Partially visualized is bilateral posterior spinal fusion hardware with posterior bilateral fusion rods and interlocking screws extending into the bilateral iliac wings, with no evidence of hardware fracture or loosening. IMPRESSION: 1. Osteomyelitis of the inferior left ischium associated with curvilinear fat stranding,  fluid and gas in the posteromedial left gluteal subcutaneous soft tissues, presumably due to a medial left gluteal decubitus ulcer (which is incompletely visualized on this study). No focal drainable fluid collection. 2. No pelvic ascites or intraperitoneal fluid collection. No presacral fluid or inflammatory change. 3. 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 Electronically Signed   By: Ilona Sorrel M.D.   On: 01/01/2015 10:34   Ir Replc Duoden/jejuno Tube Percut W/fluoro  01/01/2015  CLINICAL DATA:  Routine jejunostomy tube exchange EXAM: IR REPLACE DUODEN/JEJUNO TUBE PERCUT WITH FLOURO FLUOROSCOPY TIME:  12 seconds MEDICATIONS AND MEDICAL HISTORY: None ANESTHESIA/SEDATION: None CONTRAST:  5 cc Omnipaque 300 PROCEDURE: The procedure, risks, benefits, and alternatives were explained to the patient. Questions regarding the procedure were encouraged and answered. The patient understands and consents to the procedure. The abdomen was prepped with Betadine in a sterile fashion, and a sterile drape was applied covering the operative field. A sterile gown and sterile gloves were used for the procedure. The existing gastrojejunostomy tube was removed over a stiff glidewire and exchange for a new make jejunostomy tube. The tip was positioned in the proximal jejunum. Contrast was injected. 10 cc saline was utilized to insufflate the balloon. FINDINGS: The jejunostomy tube is been exchange. Tip is in the proximal jejunum. COMPLICATIONS: None IMPRESSION: Successful jejunostomy tube exchange. Electronically Signed   By: Marybelle Killings M.D.   On: 01/01/2015 12:18   Dg Chest Port 1 View  01/04/2015  CLINICAL DATA:  Shortness of breath and fever.  Recent pneumonia. EXAM: PORTABLE CHEST 1 VIEW COMPARISON:  01/01/2015 and 12/31/2014. FINDINGS: 0814 hours. There are chronic low lung volumes. The patient's mandible overlies the right lung apex. Chronic bibasilar atelectasis or scarring is  unchanged. There is no airspace disease, edema or pleural effusion. Right subclavian Port-A-Cath appears unchanged. The bones appear unchanged status post thoracolumbar fusion. IMPRESSION: No significant change in chronic bibasilar atelectasis or scarring. No acute findings demonstrated. Electronically Signed   By: Richardean Sale M.D.   On: 01/04/2015 10:29   Portable Chest 1 View  01/01/2015  CLINICAL DATA:  Shortness of breath, cough, dyspnea.  Cerebral palsy EXAM: PORTABLE CHEST 1 VIEW COMPARISON:  Yesterday FINDINGS: Porta catheter on the right with tip in good position at the SVC level. Persistent hypoventilation and linear opacities at the bases. There is no edema, consolidation, effusion, or pneumothorax. Normal heart size and mediastinal contours. Extensive spinal fixation. IMPRESSION: Chronic hypoventilation with basilar atelectasis or scarring. Electronically Signed   By: Monte Fantasia M.D.   On: 01/01/2015 07:40   Dg Chest Port 1 View  12/31/2014  CLINICAL DATA:  Chest pain recent pneumonia persistent fever for 1 week EXAM: PORTABLE CHEST 1 VIEW COMPARISON:  10/19/2014 FINDINGS: Spinal rods noted. Port-A-Cath stable on the right. Low lung volumes with bibasilar atelectasis. Heart size normal. IMPRESSION: Mild bilateral lower lobe atelectasis. No definite evidence of pneumonia. Subtle infiltrate could be due obscured by the bilateral lower lobe atelectasis however. Electronically Signed   By: Skipper Cliche M.D.   On: 12/31/2014 17:39    Oren Binet, MD  [facility] Pager:[phone]  If 7PM-7AM, please contact night-coverage [email or web address] Password [email or web address]  01/04/2015, 1:01 PM   LOS: 4 days

## 2015-01-04 NOTE — Care Management (Signed)
Case manager contacted patient's bedside nurse to get update on patient and discharge plan. Per RN patient will remain in hospital, he continues with a fever 101.7.  Case manager informed that Palliative consult may be requested.  Case manager contacted Sherrell Puller with [facility] and Sheppard Evens with [facility] to inform them of delay in discharge . Case Management will continue to monitor.

## 2015-01-04 NOTE — Progress Notes (Signed)
ANTIBIOTIC CONSULT NOTE - FOLLOW UP  Pharmacy Consult to restart Zosyn Indication: osteomyelitis L ischium, decub ulcer  Allergies  Allergen Reactions  . Depakote [Divalproex Sodium]     Causes pancreatitis   . Vimpat [Lacosamide] Rash  . Keppra [Levetiracetam] Other (See Comments)    Bone marrow suppression  . Adhesive [Tape] Other (See Comments)    Rips skin off (paper tape is ok)  . Neulasta [Pegfilgrastim] Other (See Comments)    Fever, tachycardia    Patient Measurements: Height: 4' 11.5" (151.1 cm) Weight: 126 lb 4.8 oz (57.289 kg) IBW/kg (Calculated) : 48.85  Vital Signs: Temp: 101.7 F (38.7 C) (12/03 0828) Temp Source: Rectal (12/03 0828) BP: 128/76 mmHg (12/03 0828) Pulse Rate: 121 (12/03 0828) Intake/Output from previous day: 12/02 0701 - 12/03 0700 In: [id number] [I.V.:3] Out: 975 [Urine:975] Intake/Output from this shift:    Labs:  Recent Labs  01/02/15 0445 01/03/15 0435  WBC 5.5 2.4*  HGB 10.4* 9.5*  PLT 135* 128*  CREATININE 0.59* 0.56*   Estimated Creatinine Clearance: 98.5 mL/min (by C-G formula based on Cr of 0.56).  Recent Labs  01/03/15 0454  [facility] 14     Microbiology: Recent Results (from the past 720 hour(s))  Blood Culture (routine x 2)     Status: None (Preliminary result)   Collection Time: 12/31/14  2:51 PM  Result Value Ref Range Status   Specimen Description BLOOD PORTA CATH  Final   Special Requests BOTTLES DRAWN AEROBIC AND ANAEROBIC 6CC   Final   Culture NO GROWTH 3 DAYS  Final   Report Status PENDING  Incomplete  Urine culture     Status: None   Collection Time: 12/31/14  2:54 PM  Result Value Ref Range Status   Specimen Description URINE, CATHETERIZED  Final   Special Requests NONE  Final   Culture MULTIPLE SPECIES PRESENT, SUGGEST RECOLLECTION  Final   Report Status 01/01/2015 FINAL  Final  Blood Culture (routine x 2)     Status: None (Preliminary result)   Collection Time: 12/31/14  3:50 PM  Result Value Ref  Range Status   Specimen Description BLOOD LEFT HAND  Final   Special Requests BOTTLES DRAWN AEROBIC AND ANAEROBIC 5CC  Final   Culture NO GROWTH 3 DAYS  Final   Report Status PENDING  Incomplete  MRSA PCR Screening     Status: None   Collection Time: 12/31/14  7:17 PM  Result Value Ref Range Status   MRSA by PCR NEGATIVE NEGATIVE Final    Comment:        The GeneXpert MRSA Assay (FDA approved for NASAL specimens only), is one component of a comprehensive MRSA colonization surveillance program. It is not intended to diagnose MRSA infection nor to guide or monitor treatment for MRSA infections.   Urine culture     Status: None   Collection Time: 01/02/15  5:14 AM  Result Value Ref Range Status   Specimen Description URINE, RANDOM  Final   Special Requests NONE  Final   Culture 10,000 COLONIES/mL YEAST  Final   Report Status 01/03/2015 FINAL  Final    Anti-infectives    Start     Dose/Rate Route Frequency Ordered Stop   01/04/15 0845  piperacillin-tazobactam (ZOSYN) IVPB 3.375 g     3.375 g 12.5 mL/hr over 240 Minutes Intravenous 3 times per day 01/04/15 0840     01/03/15 1700  vancomycin (VANCOCIN) IVPB 1000 mg/200 mL premix     1,000 mg 200  mL/hr over 60 Minutes Intravenous Every 12 hours 01/03/15 1032     01/03/15 1200  ertapenem [facility]) injection 1 g  Status:  Discontinued     1 g Intramuscular  Once 01/03/15 1123 01/03/15 1138   01/03/15 1200  ertapenem (INVANZ) 1 g in sodium chloride 0.9 % 50 mL IVPB     1 g 100 mL/hr over 30 Minutes Intravenous  Once 01/03/15 1138 01/03/15 1317   01/03/15 0000  vancomycin (VANCOCIN) 1 GM/200ML SOLN     1,000 mg 200 mL/hr over 60 Minutes Intravenous Every 12 hours 01/03/15 1100     01/03/15 0000  ertapenem 1 g in sodium chloride 0.9 % 50 mL  Status:  Discontinued     1 g 100 mL/hr over 30 Minutes Intravenous Every 24 hours 01/03/15 1100 01/03/15    01/03/15 0000  ertapenem 1 g in sodium chloride 0.9 % 50 mL     1 g 100 mL/hr over  30 Minutes Intravenous Every 24 hours 01/03/15 1104     01/02/15 1700  vancomycin (VANCOCIN) IVPB 750 mg/150 ml premix  Status:  Discontinued     750 mg 150 mL/hr over 60 Minutes Intravenous Every 12 hours 01/02/15 1259 01/03/15 1032   01/01/15 0400  vancomycin (VANCOCIN) IVPB 750 mg/150 ml premix  Status:  Discontinued     750 mg 150 mL/hr over 60 Minutes Intravenous Every 12 hours 12/31/14 1601 01/02/15 1259   12/31/14 2200  piperacillin-tazobactam (ZOSYN) IVPB 3.375 g  Status:  Discontinued     3.375 g 12.5 mL/hr over 240 Minutes Intravenous Every 8 hours 12/31/14 1601 01/03/15 1123   12/31/14 2030  fluconazole (DIFLUCAN) 40 MG/ML suspension 200 mg  Status:  Discontinued     200 mg Per Tube Daily 12/31/14 2029 12/31/14 2052   12/31/14 1730  levofloxacin (LEVAQUIN) IVPB 750 mg  Status:  Discontinued     750 mg 100 mL/hr over 90 Minutes Intravenous Every 24 hours 12/31/14 1726 01/02/15 1257   12/31/14 1600  vancomycin (VANCOCIN) IVPB 1000 mg/200 mL premix     1,000 mg 200 mL/hr over 60 Minutes Intravenous  Once 12/31/14 1539 12/31/14 1819   12/31/14 1600  piperacillin-tazobactam (ZOSYN) IVPB 3.375 g     3.375 g 100 mL/hr over 30 Minutes Intravenous  Once 12/31/14 1539 12/31/14 1650      Assessment: 24 y.o. M on vancomycin and zosyn D#4 for osteomyelitis of L ischium and decub ulcer . Plan for 6 weeks of IV Vancomycin and change Zosyn to Invanz upon discharge. Afeb. WBC 2.4 (chronic neutropenia).   Pt was changed to [place] in anticipation of discharge.  Discharge now delayed due to repeat fever overnight.  Tm 101.7.  MD has asked that we restart Zosyn pending update of discharge plans.  Goal of Therapy:  Renal dose adjustment of antibiotics  Plan:  Zosyn 3.375gm IV q8h Will continue to follow  [employer], Pharm.D., BCPS Clinical Pharmacist Pager [phone] 01/04/2015 8:51 AM

## 2015-01-04 NOTE — Progress Notes (Signed)
Called in to room by father for increasing SOB RR 42 HR 124 Temp 99.9 rectally. Bilateral breath sounds with coarse  rhonchi and expiratory wheezes throughout. Respiratory treatment administered.

## 2015-01-04 NOTE — Progress Notes (Signed)
*  Preliminary Results* Bilateral lower extremity venous duplex completed. Study was very technically difficult due to patient's inability to cooperate, and patient position. There is no obvious evidence of deep vein thrombosis involving the visualized veins of the right lower extremity.  Unable to visualize all of the left lower extremity veins with the exception of the left popliteal vein which was patent. No evidence of Baker's cyst bilaterally.  01/04/2015  Maudry Mayhew, RVT, RDCS, RDMS

## 2015-01-04 NOTE — Progress Notes (Signed)
Informed by RN-that family wanted to take patient home tonight. I subsequently spoke with patient's father at bedside, I informed him that patient had persistent fever all morning, and that finally he is afebrile and looking much better. I explained that I will d/w ID in am and reassess need to change antibiotics etc prior to discharge (originally planned to d/c on IV Vanco/Invanz). I explained that if the family felt strongly about going home tonight-they needed to leave AMA. I have advised continued inpatient monitoring and re-evaluation in the am. Risks of leaving prematurely has been explained to the family.

## 2015-01-05 ENCOUNTER — Inpatient Hospital Stay (HOSPITAL_COMMUNITY): Payer: Medicaid Other

## 2015-01-05 DIAGNOSIS — R748 Abnormal levels of other serum enzymes: Secondary | ICD-10-CM | POA: Insufficient documentation

## 2015-01-05 DIAGNOSIS — J690 Pneumonitis due to inhalation of food and vomit: Secondary | ICD-10-CM

## 2015-01-05 LAB — CBC WITH DIFFERENTIAL/PLATELET
BASOS PCT: 0 %
Basophils Absolute: 0 10*3/uL (ref 0.0–0.1)
EOS PCT: 3 %
Eosinophils Absolute: 0.2 10*3/uL (ref 0.0–0.7)
HEMATOCRIT: 38 % — AB (ref 39.0–52.0)
HEMOGLOBIN: 11.1 g/dL — AB (ref 13.0–17.0)
Lymphocytes Relative: 9 %
Lymphs Abs: 0.6 10*3/uL — ABNORMAL LOW (ref 0.7–4.0)
MCH: 31 pg (ref 26.0–34.0)
MCHC: 29.2 g/dL — ABNORMAL LOW (ref 30.0–36.0)
MCV: 106.1 fL — AB (ref 78.0–100.0)
MONO ABS: 1.1 10*3/uL — AB (ref 0.1–1.0)
MONOS PCT: 16 %
NEUTROS PCT: 72 %
Neutro Abs: 4.7 10*3/uL (ref 1.7–7.7)
Platelets: 142 10*3/uL — ABNORMAL LOW (ref 150–400)
RBC: 3.58 MIL/uL — AB (ref 4.22–5.81)
RDW: 21.2 % — AB (ref 11.5–15.5)
WBC: 6.6 10*3/uL (ref 4.0–10.5)

## 2015-01-05 LAB — COMPREHENSIVE METABOLIC PANEL
ALBUMIN: 2 g/dL — AB (ref 3.5–5.0)
ALK PHOS: 408 U/L — AB (ref 38–126)
ALT: 103 U/L — AB (ref 17–63)
AST: 45 U/L — AB (ref 15–41)
Anion gap: 11 (ref 5–15)
BILIRUBIN TOTAL: 0.8 mg/dL (ref 0.3–1.2)
BUN: 24 mg/dL — AB (ref 6–20)
CO2: 29 mmol/L (ref 22–32)
Calcium: 8.5 mg/dL — ABNORMAL LOW (ref 8.9–10.3)
Chloride: 105 mmol/L (ref 101–111)
Creatinine, Ser: 0.8 mg/dL (ref 0.61–1.24)
GFR calc Af Amer: >60 mL/min (ref >60)
GLUCOSE: 73 mg/dL (ref 65–99)
POTASSIUM: 3.8 mmol/L (ref 3.5–5.1)
Sodium: 145 mmol/L (ref 135–145)
TOTAL PROTEIN: 5.3 g/dL — AB (ref 6.5–8.1)

## 2015-01-05 LAB — CULTURE, BLOOD (ROUTINE X 2)
CULTURE: NO GROWTH
Culture: NO GROWTH

## 2015-01-05 MED ORDER — DEXTROSE-NACL 5-0.9 % IV SOLN
INTRAVENOUS | Status: DC
  Administered 2015-01-05: 11:00:00 via INTRAVENOUS
  Filled 2015-01-05 (×3): qty 1000

## 2015-01-05 MED ORDER — NONFORMULARY OR COMPOUNDED ITEM
300.0000 mg | Freq: Two times a day (BID) | Status: DC
  Administered 2015-01-05 – 2015-01-07 (×5): 300 mg
  Filled 2015-01-05: qty 1

## 2015-01-05 NOTE — Progress Notes (Signed)
RT note: Xopenex treatment given. Pt is resting at this time. No distress. WOB normal. No SOB. Pt has rhonchus BBS w/ fine crackles. RT will monitor. Pt may need NT suctioning at some point. RT will make RN aware. Currently on 1.5L O2 [place]. Vitals: HR: 122, RR: 26, SpO2: 97. RN made aware.

## 2015-01-05 NOTE — Progress Notes (Signed)
INFECTIOUS DISEASE PROGRESS NOTE  ID: Jeffery [name] is a 24 y.o. 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[facility])   Pressure ulcer   Osteomyelitis ([facility])  Subjective: Awake, tracks  Abtx:  Anti-infectives    Start     Dose/Rate Route Frequency Ordered Stop   01/04/15 0845  piperacillin-tazobactam (ZOSYN) IVPB 3.375 g     3.375 g 12.5 mL/hr over 240 Minutes Intravenous 3 times per day 01/04/15 0840     01/03/15 1700  vancomycin (VANCOCIN) IVPB 1000 mg/200 mL premix     1,000 mg 200 mL/hr over 60 Minutes Intravenous Every 12 hours 01/03/15 1032     01/03/15 1200  ertapenem (INVANZ) injection 1 g  Status:  Discontinued     1 g Intramuscular  Once 01/03/15 1123 01/03/15 1138   01/03/15 1200  ertapenem (INVANZ) 1 g in sodium chloride 0.9 % 50 mL IVPB     1 g 100 mL/hr over 30 Minutes Intravenous  Once 01/03/15 1138 01/03/15 1317   01/03/15 0000  vancomycin (VANCOCIN) 1 GM/200ML SOLN     1,000 mg 200 mL/hr over 60 Minutes Intravenous Every 12 hours 01/03/15 1100     01/03/15 0000  ertapenem 1 g in sodium chloride 0.9 % 50 mL  Status:  Discontinued     1 g 100 mL/hr over 30 Minutes Intravenous Every 24 hours 01/03/15 1100 01/03/15    01/03/15 0000  ertapenem 1 g in sodium chloride 0.9 % 50 mL     1 g 100 mL/hr over 30 Minutes Intravenous Every 24 hours 01/03/15 1104     01/02/15 1700  vancomycin (VANCOCIN) IVPB 750 mg/150 ml premix  Status:  Discontinued     750 mg 150 mL/hr over 60 Minutes Intravenous Every 12 hours 01/02/15 1259 01/03/15 1032   01/01/15 0400  vancomycin (VANCOCIN) IVPB 750 mg/150 ml premix  Status:  Discontinued     750 mg 150 mL/hr over 60 Minutes Intravenous Every 12 hours 12/31/14 1601 01/02/15 1259   12/31/14 2200  piperacillin-tazobactam (ZOSYN) IVPB 3.375 g   Status:  Discontinued     3.375 g 12.5 mL/hr over 240 Minutes Intravenous Every 8 hours 12/31/14 1601 01/03/15 1123   12/31/14 2030  fluconazole (DIFLUCAN) 40 MG/ML suspension 200 mg  Status:  Discontinued     200 mg Per Tube Daily 12/31/14 2029 12/31/14 2052   12/31/14 1730  levofloxacin (LEVAQUIN) IVPB 750 mg  Status:  Discontinued     750 mg 100 mL/hr over 90 Minutes Intravenous Every 24 hours 12/31/14 1726 01/02/15 1257   12/31/14 1600  vancomycin (VANCOCIN) IVPB 1000 mg/200 mL premix     1,000 mg 200 mL/hr over 60 Minutes Intravenous  Once 12/31/14 1539 12/31/14 1819   12/31/14 1600  piperacillin-tazobactam (ZOSYN) IVPB 3.375 g     3.375 g 100 mL/hr over 30 Minutes Intravenous  Once 12/31/14 1539 12/31/14 1650      Medications:  Scheduled: . sodium chloride   Intravenous Once  . Beanaid Natural Enzyme (alpha galactosidase) 300 GalU  2 capsule Per Tube TID WC  . cholecalciferol  1,000 Units Per Tube Daily  . cloBAZam  3.75 mg Per Tube TID  . diazepam  12.5 mg Rectal Once  . enoxaparin (LOVENOX) injection  40 mg Subcutaneous Q24H  .  famotidine  40 mg Intravenous Q12H  . feeding supplement (PRO-STAT SUGAR FREE 64)  30 mL Per J Tube TID  . folic acid  1 mg Per Tube Q24H  . furosemide  20 mg Intravenous Once  . gabapentin  900 mg Per Tube 3 times per day  . gatorade (BH)  180 mL Per Tube 8 times per day  . levalbuterol  1.25 mg Nebulization BID  . metoCLOPramide  10 mg Per Tube TID AC & HS  . omeprazole  20 mg Oral BID  . piperacillin-tazobactam (ZOSYN)  IV  3.375 g Intravenous 3 times per day  . sodium chloride  3 mL Intravenous Q12H  . sucralfate  1 g Per Tube 4 times per day  . TWOCAL HN  237 mL Per J Tube TID  . vancomycin  1,000 mg Intravenous Q12H  . Vitamin C Liquid (ascorbic acid) 300 mg/5 ml - Pt home supply  300 mg Per Tube BID  . Zinc 244mg /32ml  244 mg Per Tube BID    Objective: Vital signs in last 24 hours: Temp:  [96.9 F (36.1 C)-100.5 F (38.1 C)] 100.5  F (38.1 C) (12/04 1200) Pulse Rate:  [90-124] 124 (12/04 1200) Resp:  [16-24] 23 (12/04 1200) BP: (113-142)/(67-98) 116/78 mmHg (12/04 1200) SpO2:  [93 %-100 %] 94 % (12/04 1200) FiO2 (%):  [35 %] 35 % (12/04 0620) Weight:  [58.968 kg (130 lb)] 58.968 kg (130 lb) (12/04 1206)   General appearance: alert and no distress Resp: diminished breath sounds anterior - bilateral Cardio: regular rate and rhythm GI: normal findings: bowel sounds normal and soft, non-tender and abnormal findings:  distended  Lab Results  Recent Labs  01/04/15 1120 01/05/15 0345  WBC 7.1 6.6  HGB 11.1* 11.1*  HCT 37.2* 38.0*  NA 142 145  K 4.0 3.8  CL 103 105  CO2 29 29  BUN 23* 24*  CREATININE 0.68 0.80   Liver Panel  Recent Labs  01/05/15 0345  PROT 5.3*  ALBUMIN 2.0*  AST 45*  ALT 103*  ALKPHOS 408*  BILITOT 0.8   Sedimentation Rate No results for input(s): ESRSEDRATE in the last 72 hours. C-Reactive Protein No results for input(s): CRP in the last 72 hours.  Microbiology: Recent Results (from the past 240 hour(s))  Blood Culture (routine x 2)     Status: None (Preliminary result)   Collection Time: 12/31/14  2:51 PM  Result Value Ref Range Status   Specimen Description BLOOD PORTA CATH  Final   Special Requests BOTTLES DRAWN AEROBIC AND ANAEROBIC 6CC   Final   Culture NO GROWTH 4 DAYS  Final   Report Status PENDING  Incomplete  Urine culture     Status: None   Collection Time: 12/31/14  2:54 PM  Result Value Ref Range Status   Specimen Description URINE, CATHETERIZED  Final   Special Requests NONE  Final   Culture MULTIPLE SPECIES PRESENT, SUGGEST RECOLLECTION  Final   Report Status 01/01/2015 FINAL  Final  Blood Culture (routine x 2)     Status: None (Preliminary result)   Collection Time: 12/31/14  3:50 PM  Result Value Ref Range Status   Specimen Description BLOOD LEFT HAND  Final   Special Requests BOTTLES DRAWN AEROBIC AND ANAEROBIC 5CC  Final   Culture NO GROWTH 4  DAYS  Final   Report Status PENDING  Incomplete  MRSA PCR Screening     Status: None   Collection Time: 12/31/14  7:17  PM  Result Value Ref Range Status   MRSA by PCR NEGATIVE NEGATIVE Final    Comment:        The GeneXpert MRSA Assay (FDA approved for NASAL specimens only), is one component of a comprehensive MRSA colonization surveillance program. It is not intended to diagnose MRSA infection nor to guide or monitor treatment for MRSA infections.   Urine culture     Status: None   Collection Time: 01/02/15  5:14 AM  Result Value Ref Range Status   Specimen Description URINE, RANDOM  Final   Special Requests NONE  Final   Culture 10,000 COLONIES/mL YEAST  Final   Report Status 01/03/2015 FINAL  Final  C difficile quick scan w PCR reflex     Status: None   Collection Time: 01/04/15  4:01 PM  Result Value Ref Range Status   C Diff antigen NEGATIVE NEGATIVE Final   C Diff toxin NEGATIVE NEGATIVE Final   C Diff interpretation Negative for toxigenic C. difficile  Final    Studies/Results: Dg Chest Port 1 View  01/04/2015  CLINICAL DATA:  Shortness of breath and fever.  Recent pneumonia. EXAM: PORTABLE CHEST 1 VIEW COMPARISON:  01/01/2015 and 12/31/2014. FINDINGS: 0814 hours. There are chronic low lung volumes. The patient's mandible overlies the right lung apex. Chronic bibasilar atelectasis or scarring is unchanged. There is no airspace disease, edema or pleural effusion. Right subclavian Port-A-Cath appears unchanged. The bones appear unchanged status post thoracolumbar fusion. IMPRESSION: No significant change in chronic bibasilar atelectasis or scarring. No acute findings demonstrated. Electronically Signed   By: Richardean Sale M.D.   On: 01/04/2015 10:29     Assessment/Plan: Fever Neutropenia Cholelithiasis Hepatitis Osteomyelitis L ishcium, decubitus ulcer Cerebral palsy  Total days of antibiotics: 5 vanco/zosyn  Etiology of his fever remains elusive Agree with abd  u/s althought caregiver is firm that she does not want any interventions.  Palliation seems to be goal.  Continue this current anbnx (due to osteo) c diff pending         [name] Infectious Diseases (pager) [phone] [email or web address] 01/05/2015, 1:03 PM  LOS: 5 days

## 2015-01-05 NOTE — Progress Notes (Signed)
PATIENT DETAILS Name: Jeffery [name] Age: 24 y.o. Sex: male Date of Birth: 11/13/1990 Admit Date: 12/31/2014 Admitting Physician Reyne Dumas, MD [id number], DAVID C, MD  Subjective: Continues to have significant issues with pooling of secretions. No fever this am.  Assessment/Plan: Principal Problem: Sepsis: Likely secondary to osteomyelitis. However given his issues with secretions/dysphagia-could have chemical.aspiration pneumonitis. 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 Given frailty-best managed by empiric Abx-suspect will not tolerate debridement or bone biopsy with sedation/anesthesia. C. difficile PCR negative, has significant elevation in his liver enzymes today-suspect secondary to shock liver-but given fever will check a RUQ ultrasound. We will continue with empiric vancomycin and Zosyn.  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 On chronic BiPAP-taper off oxygen.  ? Aspiration pneumonitis: Had issues with pooling of secretions yesterday evening/last night-persistently afebrile this morning-suspect ongoing aspiration pneumonitis.Current antibiotics should cover. On prn Robinul.Follow closely.   Anemia/Leukopenia: Chronic issue-attributed to bone marrow suppression from Keppra and other medications. On Neupogen, received 2 units of PRBC 11/30. Hb and WBC stable. Follows with oncology/hematology in the outpatient  Hypokalemia:replete and recheck  Dysphagia requring chronic J tube feeds: Continue regular feeds. Have discussed with pharmacy to see if there is major interaction with Robinul-if not-start to try to control secretions.   History of seizures:Continue Onfi and Neurontin.  Hx of cerebral palsy/spastic quadriplegic: Has a intrathecal baclofen pump in place.    [id number] to neurologic issues-on BIPAP Qhs  Goals of care: Discussed with patient's mother at bedside-she is aware of limitations-patient is established DO NOT RESUSCITATE-plans are to continue with aggressive care to control fever/sepsis. She wants to hold off on palliative care consultation for symptom management for now and reassess him tomorrow morning  Disposition: Remain inpatient-home 1-2 days  Antimicrobial agents  See below  Anti-infectives    Start     Dose/Rate Route Frequency Ordered Stop   01/04/15 0845  piperacillin-tazobactam (ZOSYN) IVPB 3.375 g     3.375 g 12.5 mL/hr over 240 Minutes Intravenous 3 times per day 01/04/15 0840     01/03/15 1700  vancomycin (VANCOCIN) IVPB 1000 mg/200 mL premix     1,000 mg 200 mL/hr over 60 Minutes Intravenous Every 12 hours 01/03/15 1032     01/03/15 1200  ertapenem [facility]) injection 1 g  Status:  Discontinued     1 g Intramuscular  Once 01/03/15 1123 01/03/15 1138   01/03/15 1200  ertapenem (INVANZ) 1 g in sodium chloride 0.9 % 50 mL IVPB     1 g 100 mL/hr over 30 Minutes Intravenous  Once 01/03/15 1138 01/03/15 1317   01/03/15 0000  vancomycin (VANCOCIN) 1 GM/200ML SOLN     1,000 mg 200 mL/hr over 60 Minutes Intravenous Every 12 hours 01/03/15 1100     01/03/15 0000  ertapenem 1 g in sodium chloride 0.9 % 50 mL  Status:  Discontinued     1 g 100 mL/hr over 30 Minutes Intravenous Every 24 hours 01/03/15 1100 01/03/15    01/03/15 0000  ertapenem 1 g in sodium chloride 0.9 % 50 mL     1 g 100 mL/hr over 30 Minutes Intravenous Every 24 hours 01/03/15 1104     01/02/15 1700  vancomycin (VANCOCIN) IVPB 750 mg/150 ml premix  Status:  Discontinued     750 mg 150 mL/hr  over 60 Minutes Intravenous Every 12 hours 01/02/15 1259 01/03/15 1032   01/01/15 0400  vancomycin (VANCOCIN) IVPB 750 mg/150 ml premix  Status:  Discontinued     750 mg 150 mL/hr over 60 Minutes Intravenous Every 12 hours 12/31/14 1601 01/02/15 1259   12/31/14  2200  piperacillin-tazobactam (ZOSYN) IVPB 3.375 g  Status:  Discontinued     3.375 g 12.5 mL/hr over 240 Minutes Intravenous Every 8 hours 12/31/14 1601 01/03/15 1123   12/31/14 2030  fluconazole (DIFLUCAN) 40 MG/ML suspension 200 mg  Status:  Discontinued     200 mg Per Tube Daily 12/31/14 2029 12/31/14 2052   12/31/14 1730  levofloxacin (LEVAQUIN) IVPB 750 mg  Status:  Discontinued     750 mg 100 mL/hr over 90 Minutes Intravenous Every 24 hours 12/31/14 1726 01/02/15 1257   12/31/14 1600  vancomycin (VANCOCIN) IVPB 1000 mg/200 mL premix     1,000 mg 200 mL/hr over 60 Minutes Intravenous  Once 12/31/14 1539 12/31/14 1819   12/31/14 1600  piperacillin-tazobactam (ZOSYN) IVPB 3.375 g     3.375 g 100 mL/hr over 30 Minutes Intravenous  Once 12/31/14 1539 12/31/14 1650      DVT Prophylaxis: Prophylactic Lovenox   Code Status:  DNR  Family Communication Father at bedside  Procedures: None  CONSULTS:  ID and hematology/oncology  Time spent 30 minutes-Greater than 50% of this time was spent in counseling, explanation of diagnosis, planning of further management, and coordination of care.  MEDICATIONS: Scheduled Meds: . sodium chloride   Intravenous Once  . Beanaid Natural Enzyme (alpha galactosidase) 300 GalU  2 capsule Per Tube TID WC  . cholecalciferol  1,000 Units Per Tube Daily  . cloBAZam  3.75 mg Per Tube TID  . diazepam  12.5 mg Rectal Once  . enoxaparin (LOVENOX) injection  40 mg Subcutaneous Q24H  . famotidine  40 mg Intravenous Q12H  . feeding supplement (PRO-STAT SUGAR FREE 64)  30 mL Per J Tube TID  . folic acid  1 mg Per Tube Q24H  . furosemide  20 mg Intravenous Once  . gabapentin  900 mg Per Tube 3 times per day  . gatorade (BH)  180 mL Per Tube 8 times per day  . levalbuterol  1.25 mg Nebulization BID  . metoCLOPramide  10 mg Per Tube TID AC & HS  . omeprazole  20 mg Oral BID  . piperacillin-tazobactam (ZOSYN)  IV  3.375 g Intravenous 3 times per day  .  sodium chloride  3 mL Intravenous Q12H  . sucralfate  1 g Per Tube 4 times per day  . TWOCAL HN  237 mL Per J Tube TID  . vancomycin  1,000 mg Intravenous Q12H  . Vitamin C Liquid (ascorbic acid) 300 mg/5 ml - Pt home supply  300 mg Per Tube BID  . Zinc 244mg /59ml  244 mg Per Tube BID   Continuous Infusions: . dextrose 5 % and 0.9% NaCl 1,000 mL infusion 50 mL/hr at 01/05/15 1111   PRN Meds:.acetaminophen (TYLENOL) oral liquid 160 mg/5 mL, diazepam, diphenoxylate-atropine, glycopyrrolate, ibuprofen, ipratropium, levalbuterol, mupirocin cream, ondansetron **OR** ondansetron (ZOFRAN) IV, simethicone    PHYSICAL EXAM: Vital signs in last 24 hours: Filed Vitals:   01/05/15 0827 01/05/15 0840 01/05/15 0921 01/05/15 1206  BP:  131/87 125/81   Pulse:  108 112   Temp:  98.8 F (37.1 C)    TempSrc:  Rectal    Resp:  20 18   Height:  Weight:    58.968 kg (130 lb)  SpO2: 98% 93% 96%     Weight change:  Filed Weights   01/03/15 0400 01/04/15 0458 01/05/15 1206  Weight: 57.516 kg (126 lb 12.8 oz) 57.289 kg (126 lb 4.8 oz) 58.968 kg (130 lb)   Body mass index is 25.83 kg/(m^2).   Gen Exam: Awake and alert-flushed this morning-not in any distress. Neck: Supple Chest: B/L Clear anteriorly-with only a few transmitted upper airway sounds CVS: S1 S2 Regular, no murmurs.  Abdomen: soft, BS +, non tender, non distended. G-tube in place-very minimal erythema around the G-tube site Extremities: no edema, lower extremities warm to touch. Neurologic: Decreased bulk-0 X 5 in all 4 ext   Intake/Output from previous day:  Intake/Output Summary (Last 24 hours) at 01/05/15 1252 Last data filed at 01/05/15 1205  Gross per 24 hour  Intake    423 ml  Output    750 ml  Net   -327 ml     LAB RESULTS: CBC  Recent Labs Lab 12/31/14 1450 01/01/15 0630 01/02/15 0445 01/03/15 0435 01/04/15 1120 01/05/15 0345  WBC 1.1* 1.9* 5.5 2.4* 7.1 6.6  HGB 7.0* 6.7* 10.4* 9.5* 11.1* 11.1*  HCT  23.6* 22.9* 33.5* 31.1* 37.2* 38.0*  PLT 137* 151 135* 128* 156 142*  MCV 109.8* 108.5* 101.2* 102.3* 104.2* 106.1*  MCH 32.6 31.8 31.4 31.3 31.1 31.0  MCHC 29.7* 29.3* 31.0 30.5 29.8* 29.2*  RDW 18.3* 18.4* 23.0* 22.1* 21.2* 21.2*  LYMPHSABS 0.6*  --   --   --   --  0.6*  MONOABS 0.1  --   --   --   --  1.1*  EOSABS 0.0  --   --   --   --  0.2  BASOSABS 0.0  --   --   --   --  0.0    Chemistries   Recent Labs Lab 12/31/14 2330 01/01/15 0630 01/02/15 0445 01/03/15 0435 01/04/15 1120 01/05/15 0345  NA  --  140 141 140 142 145  K  --  3.6 3.1* 3.3* 4.0 3.8  CL  --  99* 100* 100* 103 105  CO2  --  35* 31 32 29 29  GLUCOSE  --  83 82 82 85 73  BUN  --  18 22* 23* 23* 24*  CREATININE  --  0.56* 0.59* 0.56* 0.68 0.80  CALCIUM  --  8.2* 8.4* 8.2* 8.4* 8.5*  MG 2.3  --   --   --   --   --     CBG: No results for input(s): GLUCAP in the last 168 hours.  GFR Estimated Creatinine Clearance: 106.5 mL/min (by C-G formula based on Cr of 0.8).  Coagulation profile No results for input(s): INR, PROTIME in the last 168 hours.  Cardiac Enzymes No results for input(s): CKMB, TROPONINI, MYOGLOBIN in the last 168 hours.  Invalid input(s): CK  Invalid input(s): POCBNP No results for input(s): DDIMER in the last 72 hours. No results for input(s): HGBA1C in the last 72 hours. No results for input(s): CHOL, HDL, LDLCALC, TRIG, CHOLHDL, LDLDIRECT in the last 72 hours. No results for input(s): TSH, T4TOTAL, T3FREE, THYROIDAB in the last 72 hours.  Invalid input(s): FREET3 No results for input(s): VITAMINB12, FOLATE, FERRITIN, TIBC, IRON, RETICCTPCT in the last 72 hours. No results for input(s): LIPASE, AMYLASE in the last 72 hours.  Urine Studies No results for input(s): UHGB, CRYS in the last 72 hours.  Invalid input(s):  UACOL, UAPR, USPG, UPH, UTP, UGL, UKET, UBIL, UNIT, UROB, ULEU, UEPI, UWBC, URBC, UBAC, CAST, UCOM, BILUA  MICROBIOLOGY: Recent Results (from the past 240  hour(s))  Blood Culture (routine x 2)     Status: None (Preliminary result)   Collection Time: 12/31/14  2:51 PM  Result Value Ref Range Status   Specimen Description BLOOD PORTA CATH  Final   Special Requests BOTTLES DRAWN AEROBIC AND ANAEROBIC [place]   Final   Culture NO GROWTH 4 DAYS  Final   Report Status PENDING  Incomplete  Urine culture     Status: None   Collection Time: 12/31/14  2:54 PM  Result Value Ref Range Status   Specimen Description URINE, CATHETERIZED  Final   Special Requests NONE  Final   Culture MULTIPLE SPECIES PRESENT, SUGGEST RECOLLECTION  Final   Report Status 01/01/2015 FINAL  Final  Blood Culture (routine x 2)     Status: None (Preliminary result)   Collection Time: 12/31/14  3:50 PM  Result Value Ref Range Status   Specimen Description BLOOD LEFT HAND  Final   Special Requests BOTTLES DRAWN AEROBIC AND ANAEROBIC 5CC  Final   Culture NO GROWTH 4 DAYS  Final   Report Status PENDING  Incomplete  MRSA PCR Screening     Status: None   Collection Time: 12/31/14  7:17 PM  Result Value Ref Range Status   MRSA by PCR NEGATIVE NEGATIVE Final    Comment:        The GeneXpert MRSA Assay (FDA approved for NASAL specimens only), is one component of a comprehensive MRSA colonization surveillance program. It is not intended to diagnose MRSA infection nor to guide or monitor treatment for MRSA infections.   Urine culture     Status: None   Collection Time: 01/02/15  5:14 AM  Result Value Ref Range Status   Specimen Description URINE, RANDOM  Final   Special Requests NONE  Final   Culture 10,000 COLONIES/mL YEAST  Final   Report Status 01/03/2015 FINAL  Final  C difficile quick scan w PCR reflex     Status: None   Collection Time: 01/04/15  4:01 PM  Result Value Ref Range Status   C Diff antigen NEGATIVE NEGATIVE Final   C Diff toxin NEGATIVE NEGATIVE Final   C Diff interpretation Negative for toxigenic C. difficile  Final    RADIOLOGY STUDIES/RESULTS: Ct  Pelvis Wo Contrast  01/01/2015  CLINICAL DATA:  Cerebral palsy.  Clinical concern for osteomyelitis. EXAM: CT PELVIS WITHOUT CONTRAST TECHNIQUE: Multidetector CT imaging of the pelvis was performed following the standard protocol without intravenous contrast. COMPARISON:  10/15/2014 CT abdomen/pelvis. FINDINGS: Reproductive: Normal prostate and seminal vesicles. Bladder: Collapsed and grossly normal urinary bladder. Visualized ureters are normal caliber. Bowel: Visualized small and large bowel are normal caliber with no bowel wall thickening. Oral contrast traverses to the distal rectum. Partially visualized is an enteric tube in the distal duodenum and proximal jejunum, with the tip not seen on this study. Vascular/Lymphatic: No pathologically enlarged lymph nodes in the pelvis. Other: No pneumoperitoneum, ascites or focal fluid collection. Musculoskeletal: There is curvilinear gas, fat stranding and ill-defined fluid in the posterior medial left gluteal subcutaneous soft tissues (series 4/ image 125), suggestive of an incompletely visualized decubitus ulcer, with extension of the fat stranding and fluid to the inferior margin of the left ischium. There is mild focal cortical erosion at the inferior left ischium with associated periosteal reaction, in keeping with osteomyelitis. No presacral  fluid or fat stranding. No evidence of a sacral decubitus ulcer. Subcutaneous ventral right lower quadrant spinal stimulator device is noted with lead coursing posteriorly and entering the lumbar spinal canal, with the tip not visualized on this study. No aggressive appearing focal osseous lesions. 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 There is stable diffuse osteopenia. No osseous fracture. No additional focal cortical erosions or periosteal reaction. Partially  visualized is bilateral posterior spinal fusion hardware with posterior bilateral fusion rods and interlocking screws extending into the bilateral iliac wings, with no evidence of hardware fracture or loosening. IMPRESSION: 1. Osteomyelitis of the inferior left ischium associated with curvilinear fat stranding, fluid and gas in the posteromedial left gluteal subcutaneous soft tissues, presumably due to a medial left gluteal decubitus ulcer (which is incompletely visualized on this study). No focal drainable fluid collection. 2. No pelvic ascites or intraperitoneal fluid collection. No presacral fluid or inflammatory change. 3. 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 Electronically Signed   By: Ilona Sorrel M.D.   On: 01/01/2015 10:34   Ir Replc Duoden/jejuno Tube Percut W/fluoro  01/01/2015  CLINICAL DATA:  Routine jejunostomy tube exchange EXAM: IR REPLACE DUODEN/JEJUNO TUBE PERCUT WITH FLOURO FLUOROSCOPY TIME:  12 seconds MEDICATIONS AND MEDICAL HISTORY: None ANESTHESIA/SEDATION: None CONTRAST:  5 cc Omnipaque 300 PROCEDURE: The procedure, risks, benefits, and alternatives were explained to the patient. Questions regarding the procedure were encouraged and answered. The patient understands and consents to the procedure. The abdomen was prepped with Betadine in a sterile fashion, and a sterile drape was applied covering the operative field. A sterile gown and sterile gloves were used for the procedure. The existing gastrojejunostomy tube was removed over a stiff glidewire and exchange for a new make jejunostomy tube. The tip was positioned in the proximal jejunum. Contrast was injected. 10 cc saline was utilized to insufflate the balloon. FINDINGS: The jejunostomy tube is been exchange. Tip is in the proximal jejunum. COMPLICATIONS: None IMPRESSION: Successful jejunostomy tube exchange. Electronically Signed   By: Marybelle Killings M.D.   On: 01/01/2015 12:18   Dg Chest Port 1  View  01/04/2015  CLINICAL DATA:  Shortness of breath and fever.  Recent pneumonia. EXAM: PORTABLE CHEST 1 VIEW COMPARISON:  01/01/2015 and 12/31/2014. FINDINGS: 0814 hours. There are chronic low lung volumes. The patient's mandible overlies the right lung apex. Chronic bibasilar atelectasis or scarring is unchanged. There is no airspace disease, edema or pleural effusion. Right subclavian Port-A-Cath appears unchanged. The bones appear unchanged status post thoracolumbar fusion. IMPRESSION: No significant change in chronic bibasilar atelectasis or scarring. No acute findings demonstrated. Electronically Signed   By: Richardean Sale M.D.   On: 01/04/2015 10:29   Portable Chest 1 View  01/01/2015  CLINICAL DATA:  Shortness of breath, cough, dyspnea.  Cerebral palsy EXAM: PORTABLE CHEST 1 VIEW COMPARISON:  Yesterday FINDINGS: Porta catheter on the right with tip in good position at the SVC level. Persistent hypoventilation and linear opacities at the bases. There is no edema, consolidation, effusion, or pneumothorax. Normal heart size and mediastinal contours. Extensive spinal fixation. IMPRESSION: Chronic hypoventilation with basilar atelectasis or scarring. Electronically Signed   By: Monte Fantasia M.D.   On: 01/01/2015 07:40   Dg Chest Port 1 View  12/31/2014  CLINICAL DATA:  Chest pain recent pneumonia persistent fever for 1 week EXAM: PORTABLE CHEST 1 VIEW COMPARISON:  10/19/2014 FINDINGS: Spinal rods noted. Port-A-Cath stable on the right. Low lung volumes with bibasilar atelectasis. Heart size normal. IMPRESSION: Mild bilateral lower lobe atelectasis. No definite evidence of pneumonia. Subtle infiltrate could be due obscured by the bilateral lower lobe atelectasis however. Electronically Signed   By: Skipper Cliche M.D.   On: 12/31/2014 17:39    Oren Binet, MD  [facility] Pager:[phone]  If 7PM-7AM, please contact night-coverage [email or web address] Password [email or web address] 01/05/2015, 12:52  PM   LOS: 5 days

## 2015-01-06 DIAGNOSIS — E43 Unspecified severe protein-calorie malnutrition: Secondary | ICD-10-CM | POA: Insufficient documentation

## 2015-01-06 DIAGNOSIS — E46 Unspecified protein-calorie malnutrition: Secondary | ICD-10-CM

## 2015-01-06 DIAGNOSIS — R748 Abnormal levels of other serum enzymes: Secondary | ICD-10-CM

## 2015-01-06 LAB — CBC WITH DIFFERENTIAL/PLATELET
Basophils Absolute: 0 10*3/uL (ref 0.0–0.1)
Basophils Relative: 0 %
EOS ABS: 0.3 10*3/uL (ref 0.0–0.7)
Eosinophils Relative: 4 %
HEMATOCRIT: 34.9 % — AB (ref 39.0–52.0)
Hemoglobin: 10.5 g/dL — ABNORMAL LOW (ref 13.0–17.0)
LYMPHS ABS: 0.5 10*3/uL — AB (ref 0.7–4.0)
LYMPHS PCT: 8 %
MCH: 32 pg (ref 26.0–34.0)
MCHC: 30.1 g/dL (ref 30.0–36.0)
MCV: 106.4 fL — AB (ref 78.0–100.0)
MONO ABS: 1.3 10*3/uL — AB (ref 0.1–1.0)
Monocytes Relative: 19 %
NEUTROS ABS: 4.6 10*3/uL (ref 1.7–7.7)
Neutrophils Relative %: 69 %
PLATELETS: 134 10*3/uL — AB (ref 150–400)
RBC: 3.28 MIL/uL — ABNORMAL LOW (ref 4.22–5.81)
RDW: 20.4 % — AB (ref 11.5–15.5)
WBC: 6.7 10*3/uL (ref 4.0–10.5)

## 2015-01-06 LAB — VANCOMYCIN, TROUGH
VANCOMYCIN TR: 52 ug/mL — AB (ref 10.0–20.0)
VANCOMYCIN TR: 54 ug/mL — AB (ref 10.0–20.0)

## 2015-01-06 LAB — COMPREHENSIVE METABOLIC PANEL
ALT: 107 U/L — ABNORMAL HIGH (ref 17–63)
ANION GAP: 13 (ref 5–15)
AST: 59 U/L — ABNORMAL HIGH (ref 15–41)
Albumin: 1.9 g/dL — ABNORMAL LOW (ref 3.5–5.0)
Alkaline Phosphatase: 859 U/L — ABNORMAL HIGH (ref 38–126)
BILIRUBIN TOTAL: 1 mg/dL (ref 0.3–1.2)
BUN: 30 mg/dL — ABNORMAL HIGH (ref 6–20)
CO2: 26 mmol/L (ref 22–32)
Calcium: 8.3 mg/dL — ABNORMAL LOW (ref 8.9–10.3)
Chloride: 102 mmol/L (ref 101–111)
Creatinine, Ser: 1.3 mg/dL — ABNORMAL HIGH (ref 0.61–1.24)
Glucose, Bld: 87 mg/dL (ref 65–99)
POTASSIUM: 3.1 mmol/L — AB (ref 3.5–5.1)
Sodium: 141 mmol/L (ref 135–145)
TOTAL PROTEIN: 4.7 g/dL — AB (ref 6.5–8.1)

## 2015-01-06 MED ORDER — GLYCOPYRROLATE 1 MG PO TABS: 1.0000 mg | ORAL_TABLET | Freq: Three times a day (TID) | ORAL | Status: DC | PRN

## 2015-01-06 MED ORDER — SODIUM CHLORIDE 0.9 % IJ SOLN: 10.0000 mL | INTRAMUSCULAR | Status: DC | PRN

## 2015-01-06 MED ORDER — SODIUM CHLORIDE 0.9 % IV SOLN
250.0000 mL | Freq: Once | INTRAVENOUS | Status: AC
  Administered 2015-01-06: 250 mL via INTRAVENOUS

## 2015-01-06 MED ORDER — HEPARIN SOD (PORK) LOCK FLUSH 100 UNIT/ML IV SOLN: 250.0000 [IU] | INTRAVENOUS | Status: DC | PRN

## 2015-01-06 MED ORDER — GLYCOPYRROLATE 1 MG PO TABS: 1.0000 mg | ORAL_TABLET | Freq: Three times a day (TID) | ORAL | Status: AC | PRN

## 2015-01-06 MED ORDER — HEPARIN SOD (PORK) LOCK FLUSH 100 UNIT/ML IV SOLN
500.0000 [IU] | Freq: Every day | INTRAVENOUS | Status: AC | PRN
  Administered 2015-01-07: 500 [IU]

## 2015-01-06 MED ORDER — POTASSIUM CHLORIDE 20 MEQ PO PACK
40.0000 meq | PACK | Freq: Two times a day (BID) | ORAL | Status: AC
Start: 2015-01-06 — End: 2015-01-06
  Administered 2015-01-06 (×2): 40 meq
  Filled 2015-01-06 (×3): qty 2

## 2015-01-06 MED ORDER — SODIUM CHLORIDE 0.9 % IJ SOLN
3.0000 mL | INTRAMUSCULAR | Status: AC | PRN
Start: 2015-01-06 — End: 2015-01-07
  Administered 2015-01-07: 3 mL

## 2015-01-06 MED ORDER — DEXTROSE-NACL 5-0.9 % IV SOLN
INTRAVENOUS | Status: DC
  Administered 2015-01-06: 04:00:00 via INTRAVENOUS

## 2015-01-06 NOTE — Progress Notes (Signed)
INFECTIOUS DISEASE PROGRESS NOTE  ID: Javonne Dorko is a 24 y.o. male with  Principal Problem:   Sepsis ([place]) Active Problems:   Dysphagia requring chronic J tube feeds   Neuromuscular scoliosis   Generalized convulsive epilepsy ([place])   OSA (obstructive sleep apnea)   Cerebral palsy, quadriplegic (HCC)   Pressure ulcer   Osteomyelitis (H[facility])   Elevated liver enzymes  Subjective: Minimally responsive.   Abtx:  Anti-infectives    Start     Dose/Rate Route Frequency Ordered Stop   01/04/15 0845  piperacillin-tazobactam (ZOSYN) IVPB 3.375 g     3.375 g 12.5 mL/hr over 240 Minutes Intravenous 3 times per day 01/04/15 0840     01/03/15 1700  vancomycin (VANCOCIN) IVPB 1000 mg/200 mL premix     1,000 mg 200 mL/hr over 60 Minutes Intravenous Every 12 hours 01/03/15 1032     01/03/15 1200  ertapenem [facility]) injection 1 g  Status:  Discontinued     1 g Intramuscular  Once 01/03/15 1123 01/03/15 1138   01/03/15 1200  ertapenem (INVANZ) 1 g in sodium chloride 0.9 % 50 mL IVPB     1 g 100 mL/hr over 30 Minutes Intravenous  Once 01/03/15 1138 01/03/15 1317   01/03/15 0000  vancomycin (VANCOCIN) 1 GM/200ML SOLN     1,000 mg 200 mL/hr over 60 Minutes Intravenous Every 12 hours 01/03/15 1100     01/03/15 0000  ertapenem 1 g in sodium chloride 0.9 % 50 mL  Status:  Discontinued     1 g 100 mL/hr over 30 Minutes Intravenous Every 24 hours 01/03/15 1100 01/03/15    01/03/15 0000  ertapenem 1 g in sodium chloride 0.9 % 50 mL     1 g 100 mL/hr over 30 Minutes Intravenous Every 24 hours 01/03/15 1104     01/02/15 1700  vancomycin (VANCOCIN) IVPB 750 mg/150 ml premix  Status:  Discontinued     750 mg 150 mL/hr over 60 Minutes Intravenous Every 12 hours 01/02/15 1259 01/03/15 1032   01/01/15 0400  vancomycin (VANCOCIN) IVPB 750 mg/150 ml premix  Status:  Discontinued     750 mg 150 mL/hr over 60 Minutes Intravenous Every 12 hours 12/31/14 1601 01/02/15 1259   12/31/14 2200   piperacillin-tazobactam (ZOSYN) IVPB 3.375 g  Status:  Discontinued     3.375 g 12.5 mL/hr over 240 Minutes Intravenous Every 8 hours 12/31/14 1601 01/03/15 1123   12/31/14 2030  fluconazole (DIFLUCAN) 40 MG/ML suspension 200 mg  Status:  Discontinued     200 mg Per Tube Daily 12/31/14 2029 12/31/14 2052   12/31/14 1730  levofloxacin (LEVAQUIN) IVPB 750 mg  Status:  Discontinued     750 mg 100 mL/hr over 90 Minutes Intravenous Every 24 hours 12/31/14 1726 01/02/15 1257   12/31/14 1600  vancomycin (VANCOCIN) IVPB 1000 mg/200 mL premix     1,000 mg 200 mL/hr over 60 Minutes Intravenous  Once 12/31/14 1539 12/31/14 1819   12/31/14 1600  piperacillin-tazobactam (ZOSYN) IVPB 3.375 g     3.375 g 100 mL/hr over 30 Minutes Intravenous  Once 12/31/14 1539 12/31/14 1650      Medications:  Continuous: . dextrose 5 % and 0.9% NaCl 75 mL/hr at 01/06/15 0407    Objective: Vital signs in last 24 hours: Temp:  [98.6 F (37 C)-100.6 F (38.1 C)] 98.6 F (37 C) (12/05 0518) Pulse Rate:  [88-124] 88 (12/05 0755) Resp:  [11-26] 13 (12/05 0755) BP: (107-132)/(73-99) 113/74 mmHg (  12/05 0755) SpO2:  [91 %-100 %] 100 % (12/05 0755) Weight:  [58.968 kg (130 lb)-60.147 kg (132 lb 9.6 oz)] 60.147 kg (132 lb 9.6 oz) (12/05 0518)   General appearance: no distress Resp: clear to auscultation bilaterally Chest wall: no tenderness, R port is non-fluctuant.  Cardio: regular rate and rhythm GI: normal findings: bowel sounds normal and soft, non-tender and abnormal findings:  distended and peg site is clean  Lab Results  Recent Labs  01/05/15 0345 01/06/15 0540  WBC 6.6 6.7  HGB 11.1* 10.5*  HCT 38.0* 34.9*  NA 145 141  K 3.8 3.1*  CL 105 102  CO2 29 26  BUN 24* 30*  CREATININE 0.80 1.30*   Liver Panel  Recent Labs  01/05/15 0345 01/06/15 0540  PROT 5.3* 4.7*  ALBUMIN 2.0* 1.9*  AST 45* 59*  ALT 103* 107*  ALKPHOS 408* 859*  BILITOT 0.8 1.0   Sedimentation Rate No results for  input(s): ESRSEDRATE in the last 72 hours. C-Reactive Protein No results for input(s): CRP in the last 72 hours.  Microbiology: Recent Results (from the past 240 hour(s))  Blood Culture (routine x 2)     Status: None   Collection Time: 12/31/14  2:51 PM  Result Value Ref Range Status   Specimen Description BLOOD PORTA CATH  Final   Special Requests BOTTLES DRAWN AEROBIC AND ANAEROBIC 6CC   Final   Culture NO GROWTH 5 DAYS  Final   Report Status 01/05/2015 FINAL  Final  Urine culture     Status: None   Collection Time: 12/31/14  2:54 PM  Result Value Ref Range Status   Specimen Description URINE, CATHETERIZED  Final   Special Requests NONE  Final   Culture MULTIPLE SPECIES PRESENT, SUGGEST RECOLLECTION  Final   Report Status 01/01/2015 FINAL  Final  Blood Culture (routine x 2)     Status: None   Collection Time: 12/31/14  3:50 PM  Result Value Ref Range Status   Specimen Description BLOOD LEFT HAND  Final   Special Requests BOTTLES DRAWN AEROBIC AND ANAEROBIC 5CC  Final   Culture NO GROWTH 5 DAYS  Final   Report Status 01/05/2015 FINAL  Final  MRSA PCR Screening     Status: None   Collection Time: 12/31/14  7:17 PM  Result Value Ref Range Status   MRSA by PCR NEGATIVE NEGATIVE Final    Comment:        The GeneXpert MRSA Assay (FDA approved for NASAL specimens only), is one component of a comprehensive MRSA colonization surveillance program. It is not intended to diagnose MRSA infection nor to guide or monitor treatment for MRSA infections.   Urine culture     Status: None   Collection Time: 01/02/15  5:14 AM  Result Value Ref Range Status   Specimen Description URINE, RANDOM  Final   Special Requests NONE  Final   Culture 10,000 COLONIES/mL YEAST  Final   Report Status 01/03/2015 FINAL  Final  C difficile quick scan w PCR reflex     Status: None   Collection Time: 01/04/15  4:01 PM  Result Value Ref Range Status   C Diff antigen NEGATIVE NEGATIVE Final   C Diff  toxin NEGATIVE NEGATIVE Final   C Diff interpretation Negative for toxigenic C. difficile  Final    Studies/Results: US Abdomen Limited Ruq  01/05/2015  CLINICAL DATA:  Elevated liver function tests. Cerebral palsy. Feeding tube and implanted pain pump in the right upper  quadrant of the abdomen overlying the gallbladder area. EXAM: US ABDOMEN LIMITED - RIGHT UPPER QUADRANT COMPARISON:  Limited abdomen ultrasound dated 11/06/2014. Abdomen CT dated 10/15/2014. FINDINGS: Gallbladder: Multiple gallstones in the gallbladder measuring up to 7 mm in maximum diameter each. No gallbladder wall thickening or pericholecystic fluid. No apparent pain over the gallbladder. Common bile duct: Diameter: 3.8 mm Liver: Normal echogenicity.  No mass or biliary ductal dilatation seen. IMPRESSION: 1. Cholelithiasis without evidence cholecystitis. 2. Otherwise, unremarkable examination. Electronically Signed   By: Claudie Revering M.D.   On: 01/05/2015 15:59     Assessment/Plan: Fever Neutropenia Cholelithiasis Hepatitis Osteomyelitis L ishcium, decubitus ulcer Cerebral palsy Protein calorie malnutrition  Total days of antibiotics: 6 vanco/zosyn  Temps better today.  LFTs stable thought alk phos is up.  Abd u/s shows stones but no obstruction.  Palliation seems to be goal. family wishes to take him home.  Continue this current anbnx (due to osteo) aiming for 6 weeks.  c diff negative Nutrition f/u Cr up today, hydration/nutrition issues? Repeat vanco trough pending, may need to adjust dose.  His caregiver wants diflucan rx- I am hesitant to give this as his LFTs are abnormal and diflucan is hepatically metabolized. Would favor topicals for treatment of fungal dermatitis if this develops and is not severe.           [name] Infectious Diseases (pager) [phone] [email or web address] 01/06/2015, 9:15 AM  LOS: 6 days

## 2015-01-06 NOTE — Clinical Social Work Note (Signed)
Per MD patient ready for DC home. RN and patient/family notified of DC home. Address confirmed by patient/family. DC packet on chart. Ambulance transport will be requested by RN (numbers provided on packet). CSW signing off at this time.   Liz Beach MSW, [place], [place], JI:[id number]

## 2015-01-06 NOTE — Progress Notes (Signed)
ANTIBIOTIC CONSULT NOTE - FOLLOW UP  Pharmacy Consult for vancomycin Indication: rule out sepsis  Allergies  Allergen Reactions  . Depakote [Divalproex Sodium]     Causes pancreatitis   . Vimpat [Lacosamide] Rash  . Keppra [Levetiracetam] Other (See Comments)    Bone marrow suppression  . Adhesive [Tape] Other (See Comments)    Rips skin off (paper tape is ok)  . Neulasta [Pegfilgrastim] Other (See Comments)    Fever, tachycardia   Patient Measurements: Height: 4' 11.5" (151.1 cm) Weight: 132 lb 9.6 oz (60.147 kg) IBW/kg (Calculated) : 48.85 Vital Signs: Temp: 98.7 F (37.1 C) (12/05 1545) Temp Source: Oral (12/05 1545) BP: 115/100 mmHg (12/05 1545) Pulse Rate: 109 (12/05 1545) Intake/Output from previous day: 12/04 0701 - 12/05 0700 In: 1988.6 [I.V.:825; IV Piggyback:265.8] Out: 1025 [Urine:1025] Intake/Output from this shift: Total I/O In: 180 [Other:180] Out: -  Labs:  Recent Labs  01/04/15 1120 01/05/15 0345 01/06/15 0540  WBC 7.1 6.6 6.7  HGB 11.1* 11.1* 10.5*  PLT 156 142* 134*  CREATININE 0.68 0.80 1.30*   Estimated Creatinine Clearance: 66.2 mL/min (by C-G formula based on Cr of 1.3).  Recent Labs  01/06/15 1605 01/06/15 1735  VANCOTROUGH 52* 12*     Microbiology: Recent Results (from the past 720 hour(s))  Blood Culture (routine x 2)     Status: None   Collection Time: 12/31/14  2:51 PM  Result Value Ref Range Status   Specimen Description BLOOD PORTA CATH  Final   Special Requests BOTTLES DRAWN AEROBIC AND ANAEROBIC 6CC   Final   Culture NO GROWTH 5 DAYS  Final   Report Status 01/05/2015 FINAL  Final  Urine culture     Status: None   Collection Time: 12/31/14  2:54 PM  Result Value Ref Range Status   Specimen Description URINE, CATHETERIZED  Final   Special Requests NONE  Final   Culture MULTIPLE SPECIES PRESENT, SUGGEST RECOLLECTION  Final   Report Status 01/01/2015 FINAL  Final  Blood Culture (routine x 2)     Status: None   Collection Time: 12/31/14  3:50 PM  Result Value Ref Range Status   Specimen Description BLOOD LEFT HAND  Final   Special Requests BOTTLES DRAWN AEROBIC AND ANAEROBIC 5CC  Final   Culture NO GROWTH 5 DAYS  Final   Report Status 01/05/2015 FINAL  Final  MRSA PCR Screening     Status: None   Collection Time: 12/31/14  7:17 PM  Result Value Ref Range Status   MRSA by PCR NEGATIVE NEGATIVE Final    Comment:        The GeneXpert MRSA Assay (FDA approved for NASAL specimens only), is one component of a comprehensive MRSA colonization surveillance program. It is not intended to diagnose MRSA infection nor to guide or monitor treatment for MRSA infections.   Urine culture     Status: None   Collection Time: 01/02/15  5:14 AM  Result Value Ref Range Status   Specimen Description URINE, RANDOM  Final   Special Requests NONE  Final   Culture 10,000 COLONIES/mL YEAST  Final   Report Status 01/03/2015 FINAL  Final  C difficile quick scan w PCR reflex     Status: None   Collection Time: 01/04/15  4:01 PM  Result Value Ref Range Status   C Diff antigen NEGATIVE NEGATIVE Final   C Diff toxin NEGATIVE NEGATIVE Final   C Diff interpretation Negative for toxigenic C. difficile  Final  Anti-infectives    Start     Dose/Rate Route Frequency Ordered Stop   01/04/15 0845  piperacillin-tazobactam (ZOSYN) IVPB 3.375 g     3.375 g 12.5 mL/hr over 240 Minutes Intravenous 3 times per day 01/04/15 0840     01/03/15 1700  vancomycin (VANCOCIN) IVPB 1000 mg/200 mL premix  Status:  Discontinued     1,000 mg 200 mL/hr over 60 Minutes Intravenous Every 12 hours 01/03/15 1032 01/06/15 1736   01/03/15 1200  ertapenem [facility]) injection 1 g  Status:  Discontinued     1 g Intramuscular  Once 01/03/15 1123 01/03/15 1138   01/03/15 1200  ertapenem (INVANZ) 1 g in sodium chloride 0.9 % 50 mL IVPB     1 g 100 mL/hr over 30 Minutes Intravenous  Once 01/03/15 1138 01/03/15 1317   01/03/15 0000  vancomycin  (VANCOCIN) 1 GM/200ML SOLN     1,000 mg 200 mL/hr over 60 Minutes Intravenous Every 12 hours 01/03/15 1100     01/03/15 0000  ertapenem 1 g in sodium chloride 0.9 % 50 mL  Status:  Discontinued     1 g 100 mL/hr over 30 Minutes Intravenous Every 24 hours 01/03/15 1100 01/03/15    01/03/15 0000  ertapenem 1 g in sodium chloride 0.9 % 50 mL     1 g 100 mL/hr over 30 Minutes Intravenous Every 24 hours 01/03/15 1104     01/02/15 1700  vancomycin (VANCOCIN) IVPB 750 mg/150 ml premix  Status:  Discontinued     750 mg 150 mL/hr over 60 Minutes Intravenous Every 12 hours 01/02/15 1259 01/03/15 1032   01/01/15 0400  vancomycin (VANCOCIN) IVPB 750 mg/150 ml premix  Status:  Discontinued     750 mg 150 mL/hr over 60 Minutes Intravenous Every 12 hours 12/31/14 1601 01/02/15 1259   12/31/14 2200  piperacillin-tazobactam (ZOSYN) IVPB 3.375 g  Status:  Discontinued     3.375 g 12.5 mL/hr over 240 Minutes Intravenous Every 8 hours 12/31/14 1601 01/03/15 1123   12/31/14 2030  fluconazole (DIFLUCAN) 40 MG/ML suspension 200 mg  Status:  Discontinued     200 mg Per Tube Daily 12/31/14 2029 12/31/14 2052   12/31/14 1730  levofloxacin (LEVAQUIN) IVPB 750 mg  Status:  Discontinued     750 mg 100 mL/hr over 90 Minutes Intravenous Every 24 hours 12/31/14 1726 01/02/15 1257   12/31/14 1600  vancomycin (VANCOCIN) IVPB 1000 mg/200 mL premix     1,000 mg 200 mL/hr over 60 Minutes Intravenous  Once 12/31/14 1539 12/31/14 1819   12/31/14 1600  piperacillin-tazobactam (ZOSYN) IVPB 3.375 g     3.375 g 100 mL/hr over 30 Minutes Intravenous  Once 12/31/14 1539 12/31/14 1650      Assessment: 24 year old male on vancomycin and Zosyn for neutropenic fever / osteo of L ischium and decubitis ulcer.   Vancomycin trough 54 on repeat (dose NOT given before level drawn)- due to sharp rise in SCr and low muscle mass. Discussed with Dr. Sloan Leiter due to worsening renal function and potential discharge. Discharge has been  cancelled for now and BMET ordered for AM per telephone order.   Goal of Therapy:  Vancomycin trough level 15-20 mcg/ml  Plan:  Hold vancomycin.  BMET ordered per Dr. Sloan Leiter for AM.  Consider repeat vancomycin trough in 24 to 48 hours to determine clearance and further dosing.  Sloan Leiter, PharmD, BCPS Clinical Pharmacist [phone] 01/06/2015,6:52 PM

## 2015-01-06 NOTE — Progress Notes (Signed)
Vanco levels supratherapeutic >50, given worsening renal function-discharge held.Spoke with mother at bedside.

## 2015-01-06 NOTE — Progress Notes (Signed)
   01/06/15 1000  Clinical Encounter Type  Visited With Family;Patient not available  Visit Type Follow-up  Referral From Family  Consult/Referral To Chaplain  Spiritual Encounters  Spiritual Needs Prayer;Emotional  On request of mother, [place] visited with pt and family; pt asleep during visit but met with father and discussed support and d/c later in day.  10:30 AM Gwynn Burly

## 2015-01-06 NOTE — Progress Notes (Signed)
RT note: RT call to assess patient. RR increased, Sats decreased. BBS rhonchi/wheezing. Upon arrival Sats 93%, HR: 120, RR: 34. RT preformed NT suction per PRN order and cleared copious thick, tan/yellow secretions. Pt tolerated well and assisted with deep, forceful cough. RR decreased to 24, SpO2 increased to 97%. PRN Xopenex given for Exp. Wheeze. Pt seemed more comfortable following this. RT will monitor. RN updated.

## 2015-01-06 NOTE — Progress Notes (Signed)
Jeffery [name] is still having some low-grade temperatures. He is being treated for the osteomyelitis. It is possible temperatures might be from medications. He does not look septic. His white cell count yesterday was 6.6.  His tube feeds her off for right now.  I don't think he is aspirating. There's no obvious respiratory distress. A chest x-ray done on December 3 showed no no filtrate. Has some chronic changes.  He's had no obvious seizures.  On his physical exam, his vital signs are temperature 98.6. Pulse 104. Blood pressure 114/88. Head and neck exam shows no oral lesions. He has no scleral icterus. Lungs are clear bilaterally. He may have some slight wheezes at the bases. Cardiac exam tachycardic but regular. There are no murmurs, rubs or bruits. Abdomen is soft. Bowel sounds are slightly decreased. Abdomen is slightly distended. His Port-A-Cath site is intact without any erythema or swelling. Skin shows no rashes, ecchymosis or petechia.  If his temperatures continue, then possibly an echocardiogram to be done to make sure there is no evidence of endocarditis. I know that he has the Port-A-Cath in. I would check more blood cultures if he has another temperature.  We will continue to follow along. Hopefully, he will be able to go home soon.  I very much appreciate all the great care that he is getting from everybody up on [place] 5:22-23

## 2015-01-07 DIAGNOSIS — D72819 Decreased white blood cell count, unspecified: Secondary | ICD-10-CM | POA: Insufficient documentation

## 2015-01-07 DIAGNOSIS — D638 Anemia in other chronic diseases classified elsewhere: Secondary | ICD-10-CM

## 2015-01-07 LAB — CBC
HEMATOCRIT: 34.1 % — AB (ref 39.0–52.0)
HEMOGLOBIN: 10.1 g/dL — AB (ref 13.0–17.0)
MCH: 31.5 pg (ref 26.0–34.0)
MCHC: 29.6 g/dL — AB (ref 30.0–36.0)
MCV: 106.2 fL — AB (ref 78.0–100.0)
Platelets: 123 10*3/uL — ABNORMAL LOW (ref 150–400)
RBC: 3.21 MIL/uL — ABNORMAL LOW (ref 4.22–5.81)
RDW: 20.1 % — AB (ref 11.5–15.5)
WBC: 4.2 10*3/uL (ref 4.0–10.5)

## 2015-01-07 LAB — BASIC METABOLIC PANEL
ANION GAP: 14 (ref 5–15)
BUN: 29 mg/dL — AB (ref 6–20)
CHLORIDE: 106 mmol/L (ref 101–111)
CO2: 23 mmol/L (ref 22–32)
Calcium: 8.5 mg/dL — ABNORMAL LOW (ref 8.9–10.3)
Creatinine, Ser: 1.36 mg/dL — ABNORMAL HIGH (ref 0.61–1.24)
GFR calc Af Amer: >60 mL/min (ref >60)
GLUCOSE: 71 mg/dL (ref 65–99)
POTASSIUM: 4.1 mmol/L (ref 3.5–5.1)
Sodium: 143 mmol/L (ref 135–145)

## 2015-01-07 MED ORDER — ERTAPENEM SODIUM 1 G IJ SOLR
1.0000 g | Freq: Once | INTRAMUSCULAR | Status: AC
  Administered 2015-01-07: 1 g via INTRAVENOUS
  Filled 2015-01-07: qty 1

## 2015-01-07 NOTE — Progress Notes (Signed)
Patient was seen and examined, much more awake and alert. No fever. Blood cultures remain negative. Creatinine only mildly elevated at 1.36. Suspect with supratherapeutic Vanco levels-patient will have vancomycin lingering in his system for at least a week. Patient already has had close to 8 days of intravenous vancomycin while hospitalized. Since all cultures negative, suspect best to leave off of vancomycin. Discussed with Dr. Mindi Curling agrees. We will plan on just placing patient on IV Invanz. Plan was discussed with patient's mother over the phone, who is anxious for the patient to be discharged home. Stable for discharge today. Home health will continue to follow labs and vancomycin levels.PCP-Dr Talbort informed over the phone as well.

## 2015-01-07 NOTE — Progress Notes (Addendum)
INFECTIOUS DISEASE PROGRESS NOTE  ID: Jeffery [name] is a 24 y.o. male with  Principal Problem:   Sepsis ([place]) Active Problems:   Dysphagia requring chronic J tube feeds   Neuromuscular scoliosis   Generalized convulsive epilepsy ([place])   OSA (obstructive sleep apnea)   Cerebral palsy, quadriplegic (HCC)   Pressure ulcer   Osteomyelitis (HCC)   Elevated liver enzymes   Malnutrition ([place])  Subjective: alert  Abtx:  Anti-infectives    Start     Dose/Rate Route Frequency Ordered Stop   01/07/15 1030  ertapenem (INVANZ) 1 g in sodium chloride 0.9 % 50 mL IVPB     1 g 100 mL/hr over 30 Minutes Intravenous  Once 01/07/15 1029     01/04/15 0845  piperacillin-tazobactam (ZOSYN) IVPB 3.375 g  Status:  Discontinued     3.375 g 12.5 mL/hr over 240 Minutes Intravenous 3 times per day 01/04/15 0840 01/07/15 1029   01/03/15 1700  vancomycin (VANCOCIN) IVPB 1000 mg/200 mL premix  Status:  Discontinued     1,000 mg 200 mL/hr over 60 Minutes Intravenous Every 12 hours 01/03/15 1032 01/06/15 1736   01/03/15 1200  ertapenem (INVANZ) injection 1 g  Status:  Discontinued     1 g Intramuscular  Once 01/03/15 1123 01/03/15 1138   01/03/15 1200  ertapenem (INVANZ) 1 g in sodium chloride 0.9 % 50 mL IVPB     1 g 100 mL/hr over 30 Minutes Intravenous  Once 01/03/15 1138 01/03/15 1317   01/03/15 0000  vancomycin (VANCOCIN) 1 GM/200ML SOLN  Status:  Discontinued     1,000 mg 200 mL/hr over 60 Minutes Intravenous Every 12 hours 01/03/15 1100 01/07/15    01/03/15 0000  ertapenem 1 g in sodium chloride 0.9 % 50 mL  Status:  Discontinued     1 g 100 mL/hr over 30 Minutes Intravenous Every 24 hours 01/03/15 1100 01/03/15    01/03/15 0000  ertapenem 1 g in sodium chloride 0.9 % 50 mL     1 g 100 mL/hr over 30 Minutes Intravenous Every 24 hours 01/03/15 1104     01/02/15 1700  vancomycin (VANCOCIN) IVPB 750 mg/150 ml premix  Status:  Discontinued     750 mg 150 mL/hr over 60 Minutes Intravenous  Every 12 hours 01/02/15 1259 01/03/15 1032   01/01/15 0400  vancomycin (VANCOCIN) IVPB 750 mg/150 ml premix  Status:  Discontinued     750 mg 150 mL/hr over 60 Minutes Intravenous Every 12 hours 12/31/14 1601 01/02/15 1259   12/31/14 2200  piperacillin-tazobactam (ZOSYN) IVPB 3.375 g  Status:  Discontinued     3.375 g 12.5 mL/hr over 240 Minutes Intravenous Every 8 hours 12/31/14 1601 01/03/15 1123   12/31/14 2030  fluconazole (DIFLUCAN) 40 MG/ML suspension 200 mg  Status:  Discontinued     200 mg Per Tube Daily 12/31/14 2029 12/31/14 2052   12/31/14 1730  levofloxacin (LEVAQUIN) IVPB 750 mg  Status:  Discontinued     750 mg 100 mL/hr over 90 Minutes Intravenous Every 24 hours 12/31/14 1726 01/02/15 1257   12/31/14 1600  vancomycin (VANCOCIN) IVPB 1000 mg/200 mL premix     1,000 mg 200 mL/hr over 60 Minutes Intravenous  Once 12/31/14 1539 12/31/14 1819   12/31/14 1600  piperacillin-tazobactam (ZOSYN) IVPB 3.375 g     3.375 g 100 mL/hr over 30 Minutes Intravenous  Once 12/31/14 1539 12/31/14 1650      Medications:  Scheduled: . sodium chloride  Intravenous Once  . Beanaid Natural Enzyme (alpha galactosidase) 300 GalU  2 capsule Per Tube TID WC  . cholecalciferol  1,000 Units Per Tube Daily  . cloBAZam  3.75 mg Per Tube TID  . diazepam  12.5 mg Rectal Once  . enoxaparin (LOVENOX) injection  40 mg Subcutaneous Q24H  . ertapenem  1 g Intravenous Once  . famotidine  40 mg Intravenous Q12H  . feeding supplement (PRO-STAT SUGAR FREE 64)  30 mL Per J Tube TID  . folic acid  1 mg Per Tube Q24H  . furosemide  20 mg Intravenous Once  . gabapentin  900 mg Per Tube 3 times per day  . gatorade (BH)  180 mL Per Tube 8 times per day  . levalbuterol  1.25 mg Nebulization BID  . metoCLOPramide  10 mg Per Tube TID AC & HS  . omeprazole  20 mg Oral BID  . sodium chloride  3 mL Intravenous Q12H  . sucralfate  1 g Per Tube 4 times per day  . TWOCAL HN  237 mL Per J Tube TID  . Vitamin C Liquid  (ascorbic acid) 300 mg/5 ml - Pt home supply  300 mg Per Tube BID  . Zinc 244mg /31ml  244 mg Per Tube BID    Objective: Vital signs in last 24 hours: Temp:  [97.7 F (36.5 C)-99.2 F (37.3 C)] 97.7 F (36.5 C) (12/06 0748) Pulse Rate:  [92-116] 98 (12/06 0555) Resp:  [14-22] 16 (12/06 0555) BP: (107-122)/(58-100) 118/77 mmHg (12/06 0555) SpO2:  [96 %-100 %] 100 % (12/06 0834) FiO2 (%):  [35 %] 35 % (12/06 0016)   General appearance: alert and no distress Resp: diminished breath sounds bilaterally Cardio: regular rate and rhythm GI: normal findings: soft, non-tender and abnormal findings:  distended and hypoactive bowel sounds  Lab Results  Recent Labs  01/06/15 0540 01/07/15 0450  WBC 6.7 4.2  HGB 10.5* 10.1*  HCT 34.9* 34.1*  NA 141 143  K 3.1* 4.1  CL 102 106  CO2 26 23  BUN 30* 29*  CREATININE 1.30* 1.36*   Liver Panel  Recent Labs  01/05/15 0345 01/06/15 0540  PROT 5.3* 4.7*  ALBUMIN 2.0* 1.9*  AST 45* 59*  ALT 103* 107*  ALKPHOS 408* 859*  BILITOT 0.8 1.0   Sedimentation Rate No results for input(s): ESRSEDRATE in the last 72 hours. C-Reactive Protein No results for input(s): CRP in the last 72 hours.  Microbiology: Recent Results (from the past 240 hour(s))  Blood Culture (routine x 2)     Status: None   Collection Time: 12/31/14  2:51 PM  Result Value Ref Range Status   Specimen Description BLOOD PORTA CATH  Final   Special Requests BOTTLES DRAWN AEROBIC AND ANAEROBIC 6CC   Final   Culture NO GROWTH 5 DAYS  Final   Report Status 01/05/2015 FINAL  Final  Urine culture     Status: None   Collection Time: 12/31/14  2:54 PM  Result Value Ref Range Status   Specimen Description URINE, CATHETERIZED  Final   Special Requests NONE  Final   Culture MULTIPLE SPECIES PRESENT, SUGGEST RECOLLECTION  Final   Report Status 01/01/2015 FINAL  Final  Blood Culture (routine x 2)     Status: None   Collection Time: 12/31/14  3:50 PM  Result Value Ref Range  Status   Specimen Description BLOOD LEFT HAND  Final   Special Requests BOTTLES DRAWN AEROBIC AND ANAEROBIC 5CC  Final  Culture NO GROWTH 5 DAYS  Final   Report Status 01/05/2015 FINAL  Final  MRSA PCR Screening     Status: None   Collection Time: 12/31/14  7:17 PM  Result Value Ref Range Status   MRSA by PCR NEGATIVE NEGATIVE Final    Comment:        The GeneXpert MRSA Assay (FDA approved for NASAL specimens only), is one component of a comprehensive MRSA colonization surveillance program. It is not intended to diagnose MRSA infection nor to guide or monitor treatment for MRSA infections.   Urine culture     Status: None   Collection Time: 01/02/15  5:14 AM  Result Value Ref Range Status   Specimen Description URINE, RANDOM  Final   Special Requests NONE  Final   Culture 10,000 COLONIES/mL YEAST  Final   Report Status 01/03/2015 FINAL  Final  C difficile quick scan w PCR reflex     Status: None   Collection Time: 01/04/15  4:01 PM  Result Value Ref Range Status   C Diff antigen NEGATIVE NEGATIVE Final   C Diff toxin NEGATIVE NEGATIVE Final   C Diff interpretation Negative for toxigenic C. difficile  Final    Studies/Results: US Abdomen Limited Ruq  01/05/2015  CLINICAL DATA:  Elevated liver function tests. Cerebral palsy. Feeding tube and implanted pain pump in the right upper quadrant of the abdomen overlying the gallbladder area. EXAM: US ABDOMEN LIMITED - RIGHT UPPER QUADRANT COMPARISON:  Limited abdomen ultrasound dated 11/06/2014. Abdomen CT dated 10/15/2014. FINDINGS: Gallbladder: Multiple gallstones in the gallbladder measuring up to 7 mm in maximum diameter each. No gallbladder wall thickening or pericholecystic fluid. No apparent pain over the gallbladder. Common bile duct: Diameter: 3.8 mm Liver: Normal echogenicity.  No mass or biliary ductal dilatation seen. IMPRESSION: 1. Cholelithiasis without evidence cholecystitis. 2. Otherwise, unremarkable examination.  Electronically Signed   By: Claudie Revering M.D.   On: 01/05/2015 15:59     Assessment/Plan: Fever Neutropenia Cholelithiasis Hepatitis ARF Osteomyelitis L ishcium, decubitus ulcer Cerebral palsy Protein calorie malnutrition  Total days of antibiotics: 7 vanco/zosyn  No elevated temp since 12-4 Cr has increased slightly, hopefully stabilizing.  His vanco level is 54, agree with stopping. He will have in his system for several days.  Would treat his osteo with 6 weeks of invanz alone.  Will need f/u LFTs, Cr after d/c.  Home today hopefully  Appreciate care of Dr Barrie Folk.          [name] Infectious Diseases (pager) [phone] [email or web address] 01/07/2015, 10:57 AM  LOS: 7 days

## 2015-01-07 NOTE — Progress Notes (Signed)
Per MD patient being discharged home with PTAR, Dr. Sloan Leiter had a phone conversation with mom this am. All discharge instructions will be given to dad as well as all home medications from the pharmacy. ( liquid Vitamin C, Liquid Zinc, and bean-aide capsules) .

## 2015-01-07 NOTE — Progress Notes (Signed)
ANTIBIOTIC CONSULT NOTE - FOLLOW UP  Pharmacy Consult for vancomycin/zosyn Indication: rule out sepsis, osteo  Allergies  Allergen Reactions  . Depakote [Divalproex Sodium]     Causes pancreatitis   . Vimpat [Lacosamide] Rash  . Keppra [Levetiracetam] Other (See Comments)    Bone marrow suppression  . Adhesive [Tape] Other (See Comments)    Rips skin off (paper tape is ok)  . Neulasta [Pegfilgrastim] Other (See Comments)    Fever, tachycardia   Patient Measurements: Height: 4' 11.5" (151.1 cm) Weight: 132 lb 9.6 oz (60.147 kg) IBW/kg (Calculated) : 48.85 Vital Signs: Temp: 97.7 F (36.5 C) (12/06 0748) Temp Source: Axillary (12/06 0748) BP: 118/77 mmHg (12/06 0555) Pulse Rate: 98 (12/06 0555) Intake/Output from previous day: 12/05 0701 - 12/06 0700 In: 1162.5 [IV Piggyback:75] Out: 650 [Urine:650] Intake/Output from this shift:   Labs:  Recent Labs  01/05/15 0345 01/06/15 0540 01/07/15 0450  WBC 6.6 6.7 4.2  HGB 11.1* 10.5* 10.1*  PLT 142* 134* 123*  CREATININE 0.80 1.30* 1.36*   Estimated Creatinine Clearance: 63.3 mL/min (by C-G formula based on Cr of 1.36).  Recent Labs  01/06/15 1605 01/06/15 1735  VANCOTROUGH 52* 26*     Microbiology: Recent Results (from the past 720 hour(s))  Blood Culture (routine x 2)     Status: None   Collection Time: 12/31/14  2:51 PM  Result Value Ref Range Status   Specimen Description BLOOD PORTA CATH  Final   Special Requests BOTTLES DRAWN AEROBIC AND ANAEROBIC 6CC   Final   Culture NO GROWTH 5 DAYS  Final   Report Status 01/05/2015 FINAL  Final  Urine culture     Status: None   Collection Time: 12/31/14  2:54 PM  Result Value Ref Range Status   Specimen Description URINE, CATHETERIZED  Final   Special Requests NONE  Final   Culture MULTIPLE SPECIES PRESENT, SUGGEST RECOLLECTION  Final   Report Status 01/01/2015 FINAL  Final  Blood Culture (routine x 2)     Status: None   Collection Time: 12/31/14  3:50 PM   Result Value Ref Range Status   Specimen Description BLOOD LEFT HAND  Final   Special Requests BOTTLES DRAWN AEROBIC AND ANAEROBIC 5CC  Final   Culture NO GROWTH 5 DAYS  Final   Report Status 01/05/2015 FINAL  Final  MRSA PCR Screening     Status: None   Collection Time: 12/31/14  7:17 PM  Result Value Ref Range Status   MRSA by PCR NEGATIVE NEGATIVE Final    Comment:        The GeneXpert MRSA Assay (FDA approved for NASAL specimens only), is one component of a comprehensive MRSA colonization surveillance program. It is not intended to diagnose MRSA infection nor to guide or monitor treatment for MRSA infections.   Urine culture     Status: None   Collection Time: 01/02/15  5:14 AM  Result Value Ref Range Status   Specimen Description URINE, RANDOM  Final   Special Requests NONE  Final   Culture 10,000 COLONIES/mL YEAST  Final   Report Status 01/03/2015 FINAL  Final  C difficile quick scan w PCR reflex     Status: None   Collection Time: 01/04/15  4:01 PM  Result Value Ref Range Status   C Diff antigen NEGATIVE NEGATIVE Final   C Diff toxin NEGATIVE NEGATIVE Final   C Diff interpretation Negative for toxigenic C. difficile  Final    Anti-infectives  Start     Dose/Rate Route Frequency Ordered Stop   01/04/15 0845  piperacillin-tazobactam (ZOSYN) IVPB 3.375 g     3.375 g 12.5 mL/hr over 240 Minutes Intravenous 3 times per day 01/04/15 0840     01/03/15 1700  vancomycin (VANCOCIN) IVPB 1000 mg/200 mL premix  Status:  Discontinued     1,000 mg 200 mL/hr over 60 Minutes Intravenous Every 12 hours 01/03/15 1032 01/06/15 1736   01/03/15 1200  ertapenem [facility]) injection 1 g  Status:  Discontinued     1 g Intramuscular  Once 01/03/15 1123 01/03/15 1138   01/03/15 1200  ertapenem (INVANZ) 1 g in sodium chloride 0.9 % 50 mL IVPB     1 g 100 mL/hr over 30 Minutes Intravenous  Once 01/03/15 1138 01/03/15 1317   01/03/15 0000  vancomycin (VANCOCIN) 1 GM/200ML SOLN     1,000  mg 200 mL/hr over 60 Minutes Intravenous Every 12 hours 01/03/15 1100     01/03/15 0000  ertapenem 1 g in sodium chloride 0.9 % 50 mL  Status:  Discontinued     1 g 100 mL/hr over 30 Minutes Intravenous Every 24 hours 01/03/15 1100 01/03/15    01/03/15 0000  ertapenem 1 g in sodium chloride 0.9 % 50 mL     1 g 100 mL/hr over 30 Minutes Intravenous Every 24 hours 01/03/15 1104     01/02/15 1700  vancomycin (VANCOCIN) IVPB 750 mg/150 ml premix  Status:  Discontinued     750 mg 150 mL/hr over 60 Minutes Intravenous Every 12 hours 01/02/15 1259 01/03/15 1032   01/01/15 0400  vancomycin (VANCOCIN) IVPB 750 mg/150 ml premix  Status:  Discontinued     750 mg 150 mL/hr over 60 Minutes Intravenous Every 12 hours 12/31/14 1601 01/02/15 1259   12/31/14 2200  piperacillin-tazobactam (ZOSYN) IVPB 3.375 g  Status:  Discontinued     3.375 g 12.5 mL/hr over 240 Minutes Intravenous Every 8 hours 12/31/14 1601 01/03/15 1123   12/31/14 2030  fluconazole (DIFLUCAN) 40 MG/ML suspension 200 mg  Status:  Discontinued     200 mg Per Tube Daily 12/31/14 2029 12/31/14 2052   12/31/14 1730  levofloxacin (LEVAQUIN) IVPB 750 mg  Status:  Discontinued     750 mg 100 mL/hr over 90 Minutes Intravenous Every 24 hours 12/31/14 1726 01/02/15 1257   12/31/14 1600  vancomycin (VANCOCIN) IVPB 1000 mg/200 mL premix     1,000 mg 200 mL/hr over 60 Minutes Intravenous  Once 12/31/14 1539 12/31/14 1819   12/31/14 1600  piperacillin-tazobactam (ZOSYN) IVPB 3.375 g     3.375 g 100 mL/hr over 30 Minutes Intravenous  Once 12/31/14 1539 12/31/14 1650      Assessment: 24 year old male on vancomycin and Zosyn for neutropenic fever / osteo of L ischium and decubitis ulcer. Afeb, wbc wnl. Per ID, was to be discharged on 6 weeks of vanc/Invanz (Zosyn continuing inpatient).  Vancomycin trough 54 on repeat (dose NOT given before level drawn)- due to sharp rise in SCr and low muscle mass. Discussed with Dr. Sloan Leiter due to worsening renal  function and potential discharge. Discharge has been cancelled for now.  Vanc 11/29 >>    12/2 VT = 14 on 750mg  q12h    12/5 VT =  54 on 1g q12h (large jump in SCr and low known     muscle mass) Zosyn 11/29 >>12/1, 12/2 >> LVQ 11/29 >> 12/1 Ertapenem 12/2 x 1 (in anticipation of discharge)  Goal of Therapy:  Vancomycin trough level 15-20 mcg/ml  Plan:  Vancomycin on hold - Lvl in ~48h. Ghimire may change up meds rather than wait on lvl, restart Zosyn 3.375gm IV q8h Monitor clinical picture, renal function, VT prn  Elicia Lamp, PharmD, [facility] Clinical Pharmacist Pager [phone] 01/07/2015 8:41 AM

## 2015-01-07 NOTE — Clinical Social Work Note (Signed)
Clinical Social Worker arranged ambulance transport home via [facility].  Patient father at bedside and confirmed address prior to ambulance arrival.  Clinical Social Worker will sign off for now as social work intervention is no longer needed. Please consult [place] again if new need arises.  Barbette Or, [place]

## 2015-01-09 ENCOUNTER — Telehealth: Payer: Self-pay | Admitting: *Deleted

## 2015-01-09 NOTE — Telephone Encounter (Signed)
Received lab results from [place]. Reviewed with [name]. [name] DOES want Jeffery [name] to receive Neupogen today. [name] aware.

## 2015-01-12 ENCOUNTER — Emergency Department (HOSPITAL_COMMUNITY): Payer: Medicaid Other

## 2015-01-12 ENCOUNTER — Inpatient Hospital Stay (HOSPITAL_COMMUNITY)
Admission: EM | Admit: 2015-01-12 | Discharge: 2015-02-02 | DRG: 871 | Disposition: E | Payer: Medicaid Other | Attending: Pulmonary Disease | Admitting: Pulmonary Disease

## 2015-01-12 ENCOUNTER — Encounter (HOSPITAL_COMMUNITY): Payer: Self-pay | Admitting: Emergency Medicine

## 2015-01-12 DIAGNOSIS — J156 Pneumonia due to other aerobic Gram-negative bacteria: Secondary | ICD-10-CM | POA: Diagnosis present

## 2015-01-12 DIAGNOSIS — R6521 Severe sepsis with septic shock: Secondary | ICD-10-CM | POA: Diagnosis present

## 2015-01-12 DIAGNOSIS — M4140 Neuromuscular scoliosis, site unspecified: Secondary | ICD-10-CM | POA: Diagnosis present

## 2015-01-12 DIAGNOSIS — Y95 Nosocomial condition: Secondary | ICD-10-CM | POA: Diagnosis present

## 2015-01-12 DIAGNOSIS — R7401 Elevation of levels of liver transaminase levels: Secondary | ICD-10-CM | POA: Diagnosis present

## 2015-01-12 DIAGNOSIS — T501X5A Adverse effect of loop [high-ceiling] diuretics, initial encounter: Secondary | ICD-10-CM | POA: Diagnosis not present

## 2015-01-12 DIAGNOSIS — R0600 Dyspnea, unspecified: Secondary | ICD-10-CM

## 2015-01-12 DIAGNOSIS — R1310 Dysphagia, unspecified: Secondary | ICD-10-CM | POA: Diagnosis present

## 2015-01-12 DIAGNOSIS — J9601 Acute respiratory failure with hypoxia: Secondary | ICD-10-CM | POA: Diagnosis not present

## 2015-01-12 DIAGNOSIS — J690 Pneumonitis due to inhalation of food and vomit: Secondary | ICD-10-CM | POA: Diagnosis not present

## 2015-01-12 DIAGNOSIS — G808 Other cerebral palsy: Secondary | ICD-10-CM | POA: Diagnosis not present

## 2015-01-12 DIAGNOSIS — D539 Nutritional anemia, unspecified: Secondary | ICD-10-CM | POA: Diagnosis present

## 2015-01-12 DIAGNOSIS — R140 Abdominal distension (gaseous): Secondary | ICD-10-CM | POA: Diagnosis not present

## 2015-01-12 DIAGNOSIS — G809 Cerebral palsy, unspecified: Secondary | ICD-10-CM | POA: Diagnosis not present

## 2015-01-12 DIAGNOSIS — E878 Other disorders of electrolyte and fluid balance, not elsewhere classified: Secondary | ICD-10-CM | POA: Diagnosis present

## 2015-01-12 DIAGNOSIS — E876 Hypokalemia: Secondary | ICD-10-CM | POA: Diagnosis not present

## 2015-01-12 DIAGNOSIS — M4628 Osteomyelitis of vertebra, sacral and sacrococcygeal region: Secondary | ICD-10-CM | POA: Diagnosis present

## 2015-01-12 DIAGNOSIS — R4182 Altered mental status, unspecified: Secondary | ICD-10-CM | POA: Diagnosis present

## 2015-01-12 DIAGNOSIS — R197 Diarrhea, unspecified: Secondary | ICD-10-CM | POA: Insufficient documentation

## 2015-01-12 DIAGNOSIS — K219 Gastro-esophageal reflux disease without esophagitis: Secondary | ICD-10-CM | POA: Diagnosis present

## 2015-01-12 DIAGNOSIS — Z7189 Other specified counseling: Secondary | ICD-10-CM | POA: Insufficient documentation

## 2015-01-12 DIAGNOSIS — T368X5A Adverse effect of other systemic antibiotics, initial encounter: Secondary | ICD-10-CM | POA: Diagnosis present

## 2015-01-12 DIAGNOSIS — G4733 Obstructive sleep apnea (adult) (pediatric): Secondary | ICD-10-CM | POA: Diagnosis present

## 2015-01-12 DIAGNOSIS — R740 Nonspecific elevation of levels of transaminase and lactic acid dehydrogenase [LDH]: Secondary | ICD-10-CM | POA: Diagnosis present

## 2015-01-12 DIAGNOSIS — H35109 Retinopathy of prematurity, unspecified, unspecified eye: Secondary | ICD-10-CM | POA: Diagnosis present

## 2015-01-12 DIAGNOSIS — N179 Acute kidney failure, unspecified: Secondary | ICD-10-CM | POA: Diagnosis not present

## 2015-01-12 DIAGNOSIS — E860 Dehydration: Secondary | ICD-10-CM | POA: Diagnosis present

## 2015-01-12 DIAGNOSIS — G40209 Localization-related (focal) (partial) symptomatic epilepsy and epileptic syndromes with complex partial seizures, not intractable, without status epilepticus: Secondary | ICD-10-CM | POA: Diagnosis present

## 2015-01-12 DIAGNOSIS — K117 Disturbances of salivary secretion: Secondary | ICD-10-CM | POA: Diagnosis not present

## 2015-01-12 DIAGNOSIS — Q5320 Undescended testicle, unspecified, bilateral: Secondary | ICD-10-CM

## 2015-01-12 DIAGNOSIS — R569 Unspecified convulsions: Secondary | ICD-10-CM

## 2015-01-12 DIAGNOSIS — Z79899 Other long term (current) drug therapy: Secondary | ICD-10-CM

## 2015-01-12 DIAGNOSIS — Z66 Do not resuscitate: Secondary | ICD-10-CM | POA: Diagnosis present

## 2015-01-12 DIAGNOSIS — L89154 Pressure ulcer of sacral region, stage 4: Secondary | ICD-10-CM | POA: Diagnosis present

## 2015-01-12 DIAGNOSIS — R748 Abnormal levels of other serum enzymes: Secondary | ICD-10-CM | POA: Diagnosis not present

## 2015-01-12 DIAGNOSIS — Z931 Gastrostomy status: Secondary | ICD-10-CM

## 2015-01-12 DIAGNOSIS — M86652 Other chronic osteomyelitis, left thigh: Secondary | ICD-10-CM

## 2015-01-12 DIAGNOSIS — L8990 Pressure ulcer of unspecified site, unspecified stage: Secondary | ICD-10-CM | POA: Diagnosis present

## 2015-01-12 DIAGNOSIS — G800 Spastic quadriplegic cerebral palsy: Secondary | ICD-10-CM | POA: Diagnosis present

## 2015-01-12 DIAGNOSIS — J302 Other seasonal allergic rhinitis: Secondary | ICD-10-CM | POA: Diagnosis present

## 2015-01-12 DIAGNOSIS — M810 Age-related osteoporosis without current pathological fracture: Secondary | ICD-10-CM | POA: Diagnosis present

## 2015-01-12 DIAGNOSIS — E870 Hyperosmolality and hypernatremia: Secondary | ICD-10-CM | POA: Diagnosis present

## 2015-01-12 DIAGNOSIS — Z7401 Bed confinement status: Secondary | ICD-10-CM

## 2015-01-12 DIAGNOSIS — D72819 Decreased white blood cell count, unspecified: Secondary | ICD-10-CM

## 2015-01-12 DIAGNOSIS — Z91048 Other nonmedicinal substance allergy status: Secondary | ICD-10-CM

## 2015-01-12 DIAGNOSIS — K759 Inflammatory liver disease, unspecified: Secondary | ICD-10-CM | POA: Diagnosis present

## 2015-01-12 DIAGNOSIS — R578 Other shock: Secondary | ICD-10-CM | POA: Diagnosis not present

## 2015-01-12 DIAGNOSIS — Z515 Encounter for palliative care: Secondary | ICD-10-CM | POA: Diagnosis not present

## 2015-01-12 DIAGNOSIS — Z6830 Body mass index (BMI) 30.0-30.9, adult: Secondary | ICD-10-CM | POA: Diagnosis not present

## 2015-01-12 DIAGNOSIS — E872 Acidosis: Secondary | ICD-10-CM | POA: Diagnosis present

## 2015-01-12 DIAGNOSIS — A419 Sepsis, unspecified organism: Principal | ICD-10-CM | POA: Insufficient documentation

## 2015-01-12 DIAGNOSIS — B9689 Other specified bacterial agents as the cause of diseases classified elsewhere: Secondary | ICD-10-CM | POA: Diagnosis not present

## 2015-01-12 DIAGNOSIS — R6520 Severe sepsis without septic shock: Secondary | ICD-10-CM | POA: Diagnosis not present

## 2015-01-12 DIAGNOSIS — E43 Unspecified severe protein-calorie malnutrition: Secondary | ICD-10-CM | POA: Diagnosis present

## 2015-01-12 DIAGNOSIS — R0682 Tachypnea, not elsewhere classified: Secondary | ICD-10-CM | POA: Diagnosis not present

## 2015-01-12 DIAGNOSIS — Z9289 Personal history of other medical treatment: Secondary | ICD-10-CM

## 2015-01-12 DIAGNOSIS — M868X8 Other osteomyelitis, other site: Secondary | ICD-10-CM | POA: Diagnosis not present

## 2015-01-12 DIAGNOSIS — J1569 Pneumonia due to other gram-negative bacteria: Secondary | ICD-10-CM | POA: Insufficient documentation

## 2015-01-12 DIAGNOSIS — Z888 Allergy status to other drugs, medicaments and biological substances status: Secondary | ICD-10-CM

## 2015-01-12 DIAGNOSIS — D6959 Other secondary thrombocytopenia: Secondary | ICD-10-CM | POA: Diagnosis present

## 2015-01-12 DIAGNOSIS — J9621 Acute and chronic respiratory failure with hypoxia: Secondary | ICD-10-CM | POA: Diagnosis not present

## 2015-01-12 DIAGNOSIS — E861 Hypovolemia: Secondary | ICD-10-CM | POA: Diagnosis present

## 2015-01-12 DIAGNOSIS — M869 Osteomyelitis, unspecified: Secondary | ICD-10-CM | POA: Diagnosis present

## 2015-01-12 DIAGNOSIS — R0602 Shortness of breath: Secondary | ICD-10-CM

## 2015-01-12 DIAGNOSIS — J189 Pneumonia, unspecified organism: Secondary | ICD-10-CM | POA: Diagnosis not present

## 2015-01-12 DIAGNOSIS — D61818 Other pancytopenia: Secondary | ICD-10-CM | POA: Diagnosis not present

## 2015-01-12 DIAGNOSIS — Z452 Encounter for adjustment and management of vascular access device: Secondary | ICD-10-CM

## 2015-01-12 DIAGNOSIS — E871 Hypo-osmolality and hyponatremia: Secondary | ICD-10-CM | POA: Diagnosis not present

## 2015-01-12 DIAGNOSIS — E46 Unspecified protein-calorie malnutrition: Secondary | ICD-10-CM

## 2015-01-12 DIAGNOSIS — Z8614 Personal history of Methicillin resistant Staphylococcus aureus infection: Secondary | ICD-10-CM

## 2015-01-12 LAB — I-STAT ARTERIAL BLOOD GAS, ED
ACID-BASE DEFICIT: 1 mmol/L (ref 0.0–2.0)
BICARBONATE: 24 meq/L (ref 20.0–24.0)
O2 Saturation: 99 %
PCO2 ART: 48.1 mmHg — AB (ref 35.0–45.0)
PO2 ART: 143 mmHg — AB (ref 80.0–100.0)
Patient temperature: 103.9
TCO2: 25 mmol/L (ref 0–100)
pH, Arterial: 7.32 — ABNORMAL LOW (ref 7.350–7.450)

## 2015-01-12 LAB — URINALYSIS, ROUTINE W REFLEX MICROSCOPIC
Bilirubin Urine: NEGATIVE
GLUCOSE, UA: NEGATIVE mg/dL
KETONES UR: NEGATIVE mg/dL
LEUKOCYTES UA: NEGATIVE
NITRITE: NEGATIVE
PROTEIN: 100 mg/dL — AB
Specific Gravity, Urine: 1.015 (ref 1.005–1.030)
pH: 5.5 (ref 5.0–8.0)

## 2015-01-12 LAB — COMPREHENSIVE METABOLIC PANEL
ALBUMIN: 2.1 g/dL — AB (ref 3.5–5.0)
ALK PHOS: 1198 U/L — AB (ref 38–126)
ALT: 414 U/L — ABNORMAL HIGH (ref 17–63)
AST: 979 U/L — AB (ref 15–41)
BILIRUBIN TOTAL: 0.6 mg/dL (ref 0.3–1.2)
BUN: 59 mg/dL — AB (ref 6–20)
CO2: 26 mmol/L (ref 22–32)
CREATININE: 1.73 mg/dL — AB (ref 0.61–1.24)
Calcium: 8.4 mg/dL — ABNORMAL LOW (ref 8.9–10.3)
Chloride: >130 mmol/L (ref 101–111)
GFR calc Af Amer: >60 mL/min (ref >60)
GFR, EST NON AFRICAN AMERICAN: 54 mL/min — AB (ref >60)
Glucose, Bld: 115 mg/dL — ABNORMAL HIGH (ref 65–99)
Potassium: 5.2 mmol/L — ABNORMAL HIGH (ref 3.5–5.1)
Sodium: 170 mmol/L (ref 135–145)
TOTAL PROTEIN: 5.2 g/dL — AB (ref 6.5–8.1)

## 2015-01-12 LAB — I-STAT CHEM 8, ED
BUN: 60 mg/dL — ABNORMAL HIGH (ref 6–20)
BUN: 54 mg/dL — ABNORMAL HIGH (ref 6–20)
CREATININE: 1.7 mg/dL — AB (ref 0.61–1.24)
Calcium, Ion: 1.13 mmol/L (ref 1.12–1.23)
Calcium, Ion: 1.1 mmol/L — ABNORMAL LOW (ref 1.12–1.23)
Chloride: 129 mmol/L — ABNORMAL HIGH (ref 101–111)
Chloride: 129 mmol/L — ABNORMAL HIGH (ref 101–111)
Creatinine, Ser: 1.7 mg/dL — ABNORMAL HIGH (ref 0.61–1.24)
Glucose, Bld: 111 mg/dL — ABNORMAL HIGH (ref 65–99)
Glucose, Bld: 125 mg/dL — ABNORMAL HIGH (ref 65–99)
HCT: 37 % — ABNORMAL LOW (ref 39.0–52.0)
HCT: 31 % — ABNORMAL LOW (ref 39.0–52.0)
HEMOGLOBIN: 12.6 g/dL — AB (ref 13.0–17.0)
HEMOGLOBIN: 10.5 g/dL — AB (ref 13.0–17.0)
Potassium: 5 mmol/L (ref 3.5–5.1)
Potassium: 4.5 mmol/L (ref 3.5–5.1)
SODIUM: 171 mmol/L — AB (ref 135–145)
SODIUM: 172 mmol/L — AB (ref 135–145)
TCO2: 27 mmol/L (ref 0–100)
TCO2: 23 mmol/L (ref 0–100)

## 2015-01-12 LAB — CBC WITH DIFFERENTIAL/PLATELET
BASOS ABS: 0.1 10*3/uL (ref 0.0–0.1)
Basophils Relative: 1 %
EOS ABS: 0.1 10*3/uL (ref 0.0–0.7)
Eosinophils Relative: 1 %
HCT: 41.5 % (ref 39.0–52.0)
Hemoglobin: 11.7 g/dL — ABNORMAL LOW (ref 13.0–17.0)
LYMPHS ABS: 0.5 10*3/uL — AB (ref 0.7–4.0)
Lymphocytes Relative: 8 %
MCH: 32.6 pg (ref 26.0–34.0)
MCHC: 28.2 g/dL — ABNORMAL LOW (ref 30.0–36.0)
MCV: 115.6 fL — AB (ref 78.0–100.0)
MONO ABS: 0.4 10*3/uL (ref 0.1–1.0)
Monocytes Relative: 7 %
NEUTROS PCT: 83 %
Neutro Abs: 4.8 10*3/uL (ref 1.7–7.7)
PLATELETS: 219 10*3/uL (ref 150–400)
RBC: 3.59 MIL/uL — AB (ref 4.22–5.81)
RDW: 21.4 % — AB (ref 11.5–15.5)
WBC: 5.9 10*3/uL (ref 4.0–10.5)

## 2015-01-12 LAB — URINE MICROSCOPIC-ADD ON

## 2015-01-12 LAB — VANCOMYCIN, RANDOM: Vancomycin Rm: <4 ug/mL

## 2015-01-12 LAB — I-STAT CG4 LACTIC ACID, ED: LACTIC ACID, VENOUS: 5.21 mmol/L — AB (ref 0.5–2.0)

## 2015-01-12 MED ORDER — ENOXAPARIN SODIUM 30 MG/0.3ML ~~LOC~~ SOLN: 30.0000 mg | SUBCUTANEOUS | Status: DC

## 2015-01-12 MED ORDER — LIDOCAINE 5 % EX OINT: TOPICAL_OINTMENT | CUTANEOUS | Status: DC | PRN

## 2015-01-12 MED ORDER — LINEZOLID 600 MG/300ML IV SOLN: 600.0000 mg | Freq: Two times a day (BID) | INTRAVENOUS | Status: DC

## 2015-01-12 MED ORDER — SODIUM CHLORIDE 0.9 % IV BOLUS (SEPSIS)
500.0000 mL | Freq: Once | INTRAVENOUS | Status: AC
Start: 2015-01-12 — End: 2015-01-12
  Administered 2015-01-12: 500 mL via INTRAVENOUS

## 2015-01-12 MED ORDER — SODIUM CHLORIDE 0.9 % IV BOLUS (SEPSIS)
1000.0000 mL | Freq: Once | INTRAVENOUS | Status: AC
  Administered 2015-01-12: 1000 mL via INTRAVENOUS

## 2015-01-12 MED ORDER — ACETAMINOPHEN 325 MG PO TABS
650.0000 mg | ORAL_TABLET | Freq: Four times a day (QID) | ORAL | Status: DC | PRN
  Filled 2015-01-12: qty 2

## 2015-01-12 MED ORDER — DICYCLOMINE HCL 10 MG/5ML PO SOLN
10.0000 mg | Freq: Three times a day (TID) | ORAL | Status: DC | PRN
  Filled 2015-01-12: qty 5

## 2015-01-12 MED ORDER — OMEPRAZOLE 2 MG/ML ORAL SUSPENSION: 20.0000 mg | Freq: Two times a day (BID) | ORAL | Status: DC

## 2015-01-12 MED ORDER — DEXTROSE 5 % IV SOLN
INTRAVENOUS | Status: DC
  Administered 2015-01-12 – 2015-01-13 (×3): via INTRAVENOUS
  Administered 2015-01-14: 500 mL via INTRAVENOUS
  Administered 2015-01-15 – 2015-01-16 (×2): via INTRAVENOUS
  Administered 2015-01-17: 500 mL via INTRAVENOUS

## 2015-01-12 MED ORDER — ACETAMINOPHEN 650 MG RE SUPP
650.0000 mg | Freq: Once | RECTAL | Status: AC
  Administered 2015-01-12: 650 mg via RECTAL
  Filled 2015-01-12: qty 1

## 2015-01-12 MED ORDER — CLOBAZAM 2.5 MG/ML PO SUSP
3.7500 mg | Freq: Three times a day (TID) | ORAL | Status: DC
  Administered 2015-01-13 – 2015-01-23 (×32): 3.75 mg
  Filled 2015-01-12 (×32): qty 4

## 2015-01-12 MED ORDER — SODIUM CHLORIDE 0.9 % IJ SOLN
3.0000 mL | Freq: Two times a day (BID) | INTRAMUSCULAR | Status: DC
  Administered 2015-01-13 – 2015-01-23 (×16): 3 mL via INTRAVENOUS
  Administered 2015-01-24: 10 mL via INTRAVENOUS

## 2015-01-12 MED ORDER — LINEZOLID 600 MG/300ML IV SOLN
600.0000 mg | Freq: Two times a day (BID) | INTRAVENOUS | Status: DC
Start: 2015-01-12 — End: 2015-01-13
  Administered 2015-01-12 – 2015-01-13 (×2): 600 mg via INTRAVENOUS
  Filled 2015-01-12 (×3): qty 300

## 2015-01-12 MED ORDER — GABAPENTIN 250 MG/5ML PO SOLN
900.0000 mg | ORAL | Status: DC
  Administered 2015-01-13 – 2015-01-24 (×24): 900 mg
  Filled 2015-01-12 (×38): qty 18

## 2015-01-12 MED ORDER — ZINC OXIDE 11.3 % EX CREA
TOPICAL_CREAM | CUTANEOUS | Status: DC | PRN
  Filled 2015-01-12: qty 56

## 2015-01-12 MED ORDER — TWOCAL HN PO LIQD: 237.0000 mL | ORAL | Status: DC

## 2015-01-12 MED ORDER — DEXTROSE 5 % IV SOLN
2.0000 g | Freq: Two times a day (BID) | INTRAVENOUS | Status: DC
  Administered 2015-01-12 – 2015-01-13 (×2): 2 g via INTRAVENOUS
  Filled 2015-01-12 (×3): qty 2

## 2015-01-12 MED ORDER — GLYCOPYRROLATE 1 MG PO TABS
1.0000 mg | ORAL_TABLET | Freq: Three times a day (TID) | ORAL | Status: DC | PRN
  Filled 2015-01-12: qty 1

## 2015-01-12 MED ORDER — SODIUM CHLORIDE 0.9 % IV BOLUS (SEPSIS): 1000.0000 mL | INTRAVENOUS | Status: AC

## 2015-01-12 MED ORDER — KETOCONAZOLE 2 % EX CREA
1.0000 "application " | TOPICAL_CREAM | Freq: Two times a day (BID) | CUTANEOUS | Status: DC | PRN
  Filled 2015-01-12: qty 15

## 2015-01-12 MED ORDER — BACLOFEN 40000 MCG/20ML IT SOLN: 382.0000 ug | INTRATHECAL | Status: DC

## 2015-01-12 MED ORDER — METOCLOPRAMIDE HCL 5 MG/5ML PO SOLN
10.0000 mg | Freq: Three times a day (TID) | ORAL | Status: DC
  Administered 2015-01-13 – 2015-01-18 (×8): 10 mg
  Filled 2015-01-12 (×30): qty 10

## 2015-01-12 MED ORDER — PROMOD PO LIQD: 40.0000 mL | Freq: Two times a day (BID) | ORAL | Status: DC

## 2015-01-12 MED ORDER — ACETAMINOPHEN 650 MG RE SUPP: 650.0000 mg | Freq: Four times a day (QID) | RECTAL | Status: DC | PRN

## 2015-01-12 MED ORDER — SUCRALFATE 1 GM/10ML PO SUSP
1.0000 g | ORAL | Status: DC
  Administered 2015-01-17 – 2015-01-18 (×4): 1 g
  Filled 2015-01-12 (×4): qty 10

## 2015-01-12 MED ORDER — BAG BALM OINTMENT
1.0000 | TOPICAL_OINTMENT | CUTANEOUS | Status: DC
Start: 2015-01-12 — End: 2015-01-12

## 2015-01-12 MED ORDER — SODIUM CHLORIDE 0.9 % IV SOLN
INTRAVENOUS | Status: DC
  Administered 2015-01-12: 23:00:00 via INTRAVENOUS

## 2015-01-12 NOTE — ED Notes (Signed)
Wound care per family instruction, RN questioned protocol and patient's father very firm that RN follows home plan.
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
## 2015-01-12 NOTE — ED Notes (Signed)
Pt arrives from home with decreased level of consciousness, throughout the day less and less alert. Pt admitted earlier this week with fever, d/c'd on Tuesday. RR 40

## 2015-01-12 NOTE — ED Notes (Signed)
Ccm and danford MD at bedside.

## 2015-01-12 NOTE — H&P (Signed)
History and Physical  Patient Name: Jeffery [name]     [id number]    DOB: 1990-07-04    DOA: 01/13/2015 Referring physician: Davonna Belling, MD PCP: Marijean Bravo, MD      Chief Complaint: Increased respiratory distress  HPI: "Jeffery [name] is a 24 y.o. 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  The patient was recently hospitalized from 11/29-12/6  for sepsis with unclear source (suspected aspiration versus osteomyelitis) and discharged 5 days ago on daily ertapenem.  At the time of discharge, his vancomycin level was 54 ug/mL and so this was held.  All recent history is collected from the patient's guardian, [name].  She reports that he has a hospital bed and monitors at home, and that she manages his home medical and nursing care.  Since discharge, he was better for about a day, but then started to develop tachycardia, low grade fevers.  Friday, he was making less urine, so she performed a urinary cath, making do with a suction catheter because she didn't have a foley or in-and-out catheter.  Then today, he had a fever to 103F, seemed to have increased yellowish secretions that she had to suction, and was working harder to breath, and so she brought him to the hospital.  In the ED, the patient was hypotensive, tachycardic to 140 bpm, breathing 50 times per minute, had sodium 170 mmol/L, serum creatinine 1.73 mg/dL from baseline 1.3 mg/dL at discharge, worsening AST/ALT/alkaline phosphatase, and lactate 5.21 mmol/L.  Blood and urine cultures were collected and linezolid and ceftazidime were administered per Dr. Tommy Medal of ID, as well as fluids per sepsis protocol.  An ABG showed a mild respiratory acidosis and the patient was transferred to [facility].     Review  of Systems:  Unable to obtain due to patient mental status.  Allergies  Allergen Reactions  . Depakote [Divalproex Sodium] Other (See Comments)    Causes pancreatitis   . Keppra [Levetiracetam] Other (See Comments)    Bone marrow suppression  . Vimpat [Lacosamide] Rash  . Adhesive [Tape] Other (See Comments)    Rips skin off (paper tape is ok)  . Neulasta [Pegfilgrastim] Other (See Comments)    Fever, tachycardia    Prior to Admission medications   Medication Sig Start Date End Date Taking? Authorizing Provider  acetaminophen (TYLENOL) 160 MG/5ML solution Place 500 mg into feeding tube every 6 (six) hours as needed for moderate pain or fever.    Yes Historical Provider, MD  acetaminophen (TYLENOL) 325 MG suppository Place 650 mg rectally every 6 (six) hours as needed for fever.   Yes Historical Provider, MD  albuterol (PROVENTIL HFA;VENTOLIN HFA) 108 (90 BASE) MCG/ACT inhaler Inhale 2 puffs into the lungs every 4 (four) hours as needed for shortness of breath.   Yes Historical Provider, MD  albuterol (PROVENTIL) (2.5 MG/3ML) 0.083% nebulizer solution Take 2.5 mg by nebulization See admin instructions. Give 1 vial (2.5 mg) twice daily (8am and 8pm) and every 4 hours as needed for shortness of breath or wheezing 10/11/12  Yes Elsie Stain, MD  baclofen (GABLOFEN) 40000 MCG/20ML SOLN by Intrathecal route continuous. 385.2 mcg in 24 hours 05/04/12  Yes Jodi Geralds, MD  bag balm OINT ointment Apply 1 application topically See admin instructions. Apply to sacral area with each diaper change   Yes  Historical Provider, MD  calcium carbonate, dosed in mg elemental calcium, 1250 MG/5ML Place 1,250 mg into feeding tube 3 (three) times daily. 8am , 2pm, and 8pm   Yes Historical Provider, MD  Cholecalciferol (VITAMIN D3) 400 UNIT/ML LIQD Give 2.5 mLs by tube daily. 1000mg  daily 10/02/14  Yes Historical Provider, MD  DIASTAT ACUDIAL 20 MG GEL Place 12.5 mg rectally as needed (for seizure lasting  2 minutes or longer or repetitive seizures - no more than 1 dose in 12 hours (not given with nasal versed)).  04/01/14  Yes Historical Provider, MD  dicyclomine (BENTYL) 10 MG/5ML syrup Place 10 mg into feeding tube every 8 (eight) hours as needed (abdominal cramps). Do not exceed 5 days in one week   Yes Historical Provider, MD  diphenhydrAMINE (BENADRYL) 12.5 MG/5ML liquid Place 25 mg into feeding tube 4 (four) times daily as needed (30 min before Neupogen).    Yes Historical Provider, MD  diphenoxylate-atropine (LOMOTIL) 2.5-0.025 MG/5ML liquid 10 mLs by Gastric Tube route every 6 (six) hours as needed for diarrhea or loose stools.    Yes Historical Provider, MD  filgrastim (NEUPOGEN) 300 MCG/ML injection Inject 1 mL (300 mcg total) into the skin once a week. Patient taking differently: Inject 300 mcg into the skin as needed (neutropenia).  12/04/14  Yes Eliezer Bottom, NP  folic acid (FOLVITE) 1 MG tablet Give 1 mg by tube every morning. 8:00am per G tube   Yes Historical Provider, MD  furosemide (LASIX) 10 MG/ML solution 20 mg by Per J Tube route daily as needed for fluid or edema.    Yes Historical Provider, MD  gabapentin (NEURONTIN) 250 MG/5ML solution Take 18 mL 3 times daily Patient taking differently: Place 900 mg into feeding tube 3 (three) times daily. Take 18 mL 3 times daily 12/09/14  Yes Rockwell Germany, NP  glycopyrrolate (ROBINUL) 1 MG tablet Place 1 tablet (1 mg total) into feeding tube 3 (three) times daily as needed (excessive secretions). 01/06/15  Yes Shanker Kristeen Mans, MD  Heparin Lock Flush (HEPARIN FLUSH, PORCINE,) 100 UNIT/ML injection 5 mLs (500 Units total) by Intracatheter route as needed (Prior to de-accessing port). 10/25/14  Yes Volanda Napoleon, MD  ibuprofen (ADVIL,MOTRIN) 100 MG/5ML suspension 400 mg by Gastric Tube route every 6 (six) hours as needed for fever (pain).    Yes Historical Provider, MD  ketoconazole (NIZORAL) 2 % cream Apply 1 application topically 2  (two) times daily as needed for irritation.   Yes Historical Provider, MD  lidocaine (XYLOCAINE) 5 % ointment APPLY AS DIRECTED 30 minutes PRIOR TO BLOOD DRAWS 09/23/14  Yes Historical Provider, MD  metoCLOPramide (REGLAN) 5 MG/5ML solution Place 10 mLs (10 mg total) into feeding tube 4 (four) times daily -  before meals and at bedtime. 07/26/14  Yes Kelvin Cellar, MD  midazolam (VERSED) 5 MG/ML injection Place 2 mLs (10 mg total) into the nose once. Draw up 6ml in 2 syringes. Remove blue vial access device. Attach syringe to nasal atomizer for intranasal administration. Give 51ml in right nostril x 2 for seizures lasting 2 minutes or longer or for repetitive seizures in a short period of time. Patient taking differently: Place 10 mg into the nose as needed (for seizure lasting 2 minutes or longer or repetitive seizures - no more than 1 dose in 24 hours (if unable to give diastat)). Draw up 48ml in 2 syringes. Remove blue vial access device. Attach syringe to nasal atomizer for intranasal administration. Give 51ml  in right nostril x 2 for seizures lasting 2 minutes or longer or for repetitive seizures in a short period of time. 11/05/13  Yes Rockwell Germany, NP  Multiple Vitamin (MULTIVITAMIN) LIQD 10 mLs by Gastric Tube route 2 (two) times daily.    Yes Historical Provider, MD  Multiple Vitamins-Minerals (ZINC PO) 5 mLs by Gastric Tube route 2 (two) times daily. 244 mg / 5 mL zinc solution compounded by [facility]   Yes Historical Provider, MD  mupirocin ointment (BACTROBAN) 2 % Apply 1 application topically as needed (to G-T site). Patient taking differently: Apply 1 application topically 3 (three) times daily as needed (for redness at the G-T site).  09/07/12  Yes Lafayette Dragon, MD  Nutritional Supplements (PROMOD) LIQD Give 40 mLs by tube 2 (two) times daily. 8am and 5pm   Yes Historical Provider, MD  Nutritional Supplements (TWOCAL HN) LIQD 237 mLs by Per J Tube route See admin instructions. T.3 can  at 46 cc hour x 12 hours - Gatorade 172 cc pre and post each and extra 172 bid.   Yes Historical Provider, MD  omeprazole (PRILOSEC) 2 mg/mL SUSP Take 20 mg by mouth 2 (two) times daily.   Yes Historical Provider, MD  OnabotulinumtoxinA (BOTOX IJ) Inject as directed See admin instructions. Every 3 months   Yes Historical Provider, MD  ONFI 2.5 MG/ML solution Give 1.14ml via gastrostomy tube at 8 AM, 2 PM and 8 PM 11/14/14  Yes Rockwell Germany, NP  OVER THE COUNTER MEDICATION 2 capsules by Per J Tube route 3 (three) times daily.   Yes Historical Provider, MD  OXYGEN-HELIUM IN Inhale 3 L into the lungs as needed (o2 at 90%). Oxygen PRN to keep O2 Sat at 90%   Yes Historical Provider, MD  potassium chloride (KLOR-CON) 20 MEQ packet Give 20 meq daily except twice daily with lasix, given through J port Patient taking differently: Take 20 mEq by mouth daily. Give an additional dose when taking lasix 07/26/14  Yes Kelvin Cellar, MD  Pseudoephedrine HCl (SUDAFED CHILDRENS) 15 MG/5ML LIQD 30 mg by Gastric Tube route 3 (three) times daily. 8am, 2pm, 8pm   Yes Historical Provider, MD  sodium chloride 0.9 % injection Flush port after use with 10cc NS. Disp# 10 - 10cc syringes 10/25/14  Yes Volanda Napoleon, MD  sodium phosphate (FLEET) enema Place 1 enema rectally daily as needed (gas buildup/ bloating).    Yes Historical Provider, MD  sucralfate (CARAFATE) 1 GM/10ML suspension 1 g by Gastric Tube route 4 (four) times daily. 7am, 12pm, 5pm, 7pm Administer alone 30 minutes prior to other medications.   Yes Historical Provider, MD  Zinc Oxide (BALMEX EX) Apply 1 application topically See admin instructions. Apply to sacral with each diaper change   Yes Historical Provider, MD  Amino Acids-Protein Hydrolys (FEEDING SUPPLEMENT, PRO-STAT SUGAR FREE 64,) LIQD Take 30 mLs by mouth 2 (two) times daily. Patient not taking: Reported on 01/09/2015 06/27/14   Velvet Bathe, MD    Past Medical History  Diagnosis Date  . CP  (cerebral palsy), spastic, quadriplegic ([place])   . Osteoporosis   . Undescended testes   . Seasonal allergies   . IVH (intraventricular hemorrhage) ([place]) 1990-03-13    Grade IV  . Hip dislocation, bilateral ([place])   . Dysphagia   . Retinopathy of prematurity   . 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 Neuromuscular scoliosis   . Osteoporosis   . Complex partial seizures ([place])   .  Generalized convulsive epilepsy without mention of intractable epilepsy   . Sinus bradycardia     HR drops to 38-40 while sleeping  . Blister of right heel     fluid filled; origin unknown  . Kidney stones     ?  Marland Kitchen Pneumonia      chronic pneumonia ,respitory failure dx Augest 2014  . Aspiration pneumonia ([place])     "chronic" (04/12/2014)  . OSA treated with BiPAP     "since age 29"   . Anemia   . 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 GERD (gastroesophageal reflux disease)   . Epilepsy ([place])   . 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 Neutropenia ([place]) 07/03/2014    Past Surgical History  Procedure Laterality Date  . Mole removal  "several B/T 2008-2010"    "from all over"  . Jejunostomy feeding tube  03/08/2013; ~ 09/2013; ~ 01/2014    "transgastric-jujunal feeding tube"  . Gastrostomy tube placement  11./02/1998       . Button change  12/15/2010    Procedure: BUTTON CHANGE;  Surgeon: Gatha Mayer, MD;  Location: [facility];  Service: Endoscopy;  Laterality: N/A;  . Peg placement  10/07/2011    Procedure: PERCUTANEOUS ENDOSCOPIC GASTROSTOMY (PEG) REPLACEMENT;  Surgeon: Lafayette Dragon, MD;  Location: [facility];  Service: Endoscopy;  Laterality: N/A;  Needs 18 F 2.5 button ordered-dl  . Inguinal hernia repair Bilateral 1992  . Retinopathy of prematurity surgery  1992  . Achilles tendon lengthening Bilateral 12/1998  . Tendon release  12/1998    Soft tissue releases  wrists and fingers [Other]  . Infusion pump implantation  07/25/2000; 2013     Intrathecal baclofen pump  . Peg placement N/A 06/05/2012    Procedure: PERCUTANEOUS ENDOSCOPIC GASTROSTOMY (PEG) REPLACEMENT;  Surgeon: Lafayette Dragon, MD;  Location: [facility];  Service: Endoscopy;  Laterality: N/A;  button 42fr.2.5cm  . Strabismus surgery Bilateral 1993    "3 on right; 2 on left"  . Back surgery  ~ 2008    Harrington Rods in back needs to be log rolled  . Tendon repair Bilateral 09/05/2012    Procedure: LENGTHENING OF DIGITAL FLEXOR TENDONS BILTERAL HANDS;  Surgeon: Jolyn Nap, MD;  Location: [facility];  Service: Orthopedics;  Laterality: Bilateral;  . Peg placement N/A 09/13/2012    Procedure: PERCUTANEOUS ENDOSCOPIC GASTROSTOMY (PEG) REPLACEMENT;  Surgeon: Lafayette Dragon, MD;  Location: [facility];  Service: Endoscopy;  Laterality: N/A;  . Flexible sigmoidoscopy N/A 10/30/2012    Procedure: FLEXIBLE SIGMOIDOSCOPY;  Surgeon: Cleotis Nipper, MD;  Location: [facility];  Service: Endoscopy;  Laterality: N/A;  . Peg placement N/A 11/15/2012    Procedure: PERCUTANEOUS ENDOSCOPIC GASTROSTOMY (PEG) REPLACEMENT;  Surgeon: Jeryl Columbia, MD;  Location: [facility] ENDOSCOPY;  Service: Endoscopy;  Laterality: N/A;  . Gastrostomy tube placement  11./02/1998  . Rectal biopsy  10/29/2012    S/P diarrhea from Vancomycin  . Portacath placement Right 11/25/2012    chest  . Eye surgery      Family history: Family history is not on file. He was adopted.  Social History: Patient lives with his guardians, [name] and Evonnie Dawes.      Physical Exam: BP 96/49 mmHg  Pulse 136  Temp(Src) 103.9 F (39.9 C) (Rectal)  Resp 45  SpO2 97% General appearance: Thin young adult male, contractures, some loss of fat and muscle mass.  Eyes: Anicteric lids and lashes normal.    Does not open eyes to sternal rub. ENT: Mouth, lips and gums are dry.  Nose without discharge.   Skin: Seborrhea.  No redness or swelling around port.  On the left ischium, there is a 3cm deep round punched out ulcer with scant  drainage, no surrounding redness or induration, and probes to a firm stop. Cardiac: Tachycardic, regular.  No murmurs appreciated.  Skin withoout mottling. Respiratory: On BiPAP, respirations >40 bpm.  Lungs clear to ausculation, very small in volume. Abdomen: Abdomen soft, mild distension. Baclofen pump is noted.   MSK: Chronic quadruplegic deformities. Neuro: Does not open eyes to sternal rub.  Occasionally opens eyes spontaneously.  No eye contact.  No verbalizations.  No spontaneous movements.     Psych: Unable to assess.       Labs on Admission:  Fax labs from [facility] this past Thursday were obtained from mother. The metabolic panel shows sodium 170, chloride greater than 130. Sodium 3 days ago on Thursday was 160 mmol/L.  serum creatinine 1.73 mg/dL from a previous of 1.17 mg/dL on Thursday and 1.36 g/dL at discharge.   The AST and ALT at 979 and 414 U/L, respectively. The alkaline phosphatase is 1198 U/L.  the lactic acid level is 5.2 and millimoles per liter. The WBC is 5.9K per UL, hemoglobin is 11.7 g/dL, and the platelet count is normal.   Radiological Exams on Admission: Personally reviewed: Dg Chest Portable 1 View 01/05/2015 Question bibasilar infiltrates.    EKG: Independently reviewed. Sinus tachycardia.    Assessment/Plan 1. Severe sepsis, unknown source:  This is new.  Suspected source aspiration PNA versus central line versus urinary.  Wound appears normal at this time. Organism unknown. Patient meets criteria given tachycardia, tachypnea, fever, and evidence of organ dysfunction.  Blood and urine cultures drawn.  Lactate exceeds 2 mmol/L and repeat ordered within 6 hours.   -30 ml/kg bolus and admit to Stepdown with CCM consultation, appreciate cares -Discontinue ertapenem -Linezolid IV -Follow blood culture, urine culture -ID consulted, appreciate recommendations -Hold home supplements given concern for aspiration      2. Hypernatremia:  This is  new, since Thursday.  In setting of CP/quadruplegia and copious watery diarrhea. -Fluid resuscitation as above and continue with NS at 125 cc/hr -Free water deficit 7.8L calculated, will replace half in 24 hours, plus 1L for insensibles -D5 water at 175 cc/hr -Trend BMP q6hrs -Seizure precautions  3. AKI:  In setting of sepsis and hypovolemia. -Fluid resuscitation as above  4. Transaminitis:  Acute on chronic.  Family have declined workup, and worsening is expected given severe sepsis. -Trend CMP  5. Anemia, from medications:  Stable.  Has been on Neupogen per Oncology.  6. Epilepsy:  Stable.  -Continue clobazam/Onfi and gabapentin  7. PCM:  -Consult to nutrition     DVT PPx: Lovenox Diet: Hold supplements Consultants: Pulmonary, ID Code Status: DNR Family Communication: Guardians [name] by phone and [name] in person.  Medical decision making: What exists of the patient's previous chart was reviewed in depth and the case was discussed with Drs. Geraldine, [place], and [place]. Patient seen 9:53 PM on 01/06/2015.  Disposition Plan:  Admit for sepsis, severe and hypernatremia.  Fluid resuscitation and broad spectrum antibitoics.  Prognosis is guarded.     Edwin Dada [facility] Pager [phone]

## 2015-01-12 NOTE — Consult Note (Signed)
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 He presents to the ED from home with a decreased level of consciousness and has been progressively less alert throughout the day. It was noted on admission that his RR was 40. Patient has been recently treated for fevers and sepsis 2/2 osteomyelitis. He on on invanz at home via line. History was obtained from father who is at bedside. He also states that the patients mother suctioned out yellow material from the patients trachea earlier today. Mother is currently at [facility], too nervous from his illness to drive to [facility] safely. His father states that he hopes the patient goes to [facility] as he states that the family is very satisfied with his care there.   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  has past surgical history that includes Mole removal ("several B/T 2008-2010"); Jejunostomy feeding tube (03/08/2013; ~ 09/2013; ~ 01/2014); Gastrostomy tube placement  (11./02/1998); Button change (12/15/2010); PEG placement (10/07/2011); Inguinal hernia repair (Bilateral, 1992); Retinopathy of prematurity surgery (1992); Achilles tendon lengthening (Bilateral, 12/1998); Tendon release (12/1998); Infusion pump implantation (07/25/2000; 2013); PEG placement (N/A, 06/05/2012); Strabismus surgery (Bilateral, 1993); Back surgery (~ 2008); Tendon repair (Bilateral, 09/05/2012); PEG placement (N/A, 09/13/2012); Flexible sigmoidoscopy (N/A, 10/30/2012); PEG placement (N/A, 11/15/2012); Gastrostomy tube placement (11./02/1998); Rectal biopsy (10/29/2012); Portacath placement (Right, 11/25/2012); and Eye surgery. Prior to Admission medications   Medication Sig Start Date End Date Taking? Authorizing Provider  acetaminophen (TYLENOL) 160 MG/5ML solution Place 500 mg into feeding tube every 6 (six) hours as needed for moderate pain or fever.    Yes Historical Provider, MD  acetaminophen (TYLENOL) 325 MG suppository Place 650 mg rectally every 6 (six) hours as needed for fever.   Yes Historical Provider, MD  albuterol (PROVENTIL HFA;VENTOLIN HFA) 108 (90 BASE) MCG/ACT inhaler Inhale 2 puffs into the lungs every 4 (four) hours as needed for shortness of breath.   Yes Historical Provider, MD  albuterol (PROVENTIL) (2.5 MG/3ML) 0.083% nebulizer solution Take 2.5 mg by nebulization See admin instructions. Give 1 vial (2.5 mg) twice daily (8am and 8pm) and every 4 hours as needed for shortness of breath or wheezing 10/11/12  Yes Elsie Stain, MD  baclofen (GABLOFEN) 40000 MCG/20ML SOLN by Intrathecal route continuous. 385.2 mcg in 24 hours 05/04/12  Yes Jodi Geralds, MD  bag balm OINT ointment Apply 1 application topically See admin instructions. Apply to sacral area with each diaper change   Yes Historical Provider, MD  calcium carbonate, dosed in mg elemental calcium, 1250 MG/5ML Place 1,250 mg into feeding tube 3 (three) times daily. 8am , 2pm, and 8pm   Yes Historical Provider, MD    Cholecalciferol (VITAMIN D3) 400 UNIT/ML LIQD Give  2.5 mLs by tube daily. 1000mg  daily 10/02/14  Yes Historical Provider, MD  DIASTAT ACUDIAL 20 MG GEL Place 12.5 mg rectally as needed (for seizure lasting 2 minutes or longer or repetitive seizures - no more than 1 dose in 12 hours (not given with nasal versed)).  04/01/14  Yes Historical Provider, MD  dicyclomine (BENTYL) 10 MG/5ML syrup Place 10 mg into feeding tube every 8 (eight) hours as needed (abdominal cramps). Do not exceed 5 days in one week   Yes Historical Provider, MD  diphenhydrAMINE (BENADRYL) 12.5 MG/5ML liquid Place 25 mg into feeding tube 4 (four) times daily as needed (30 min before Neupogen).    Yes Historical Provider, MD  diphenoxylate-atropine (LOMOTIL) 2.5-0.025 MG/5ML liquid 10 mLs by Gastric Tube route every 6 (six) hours as needed for diarrhea or loose stools.    Yes Historical Provider, MD  filgrastim (NEUPOGEN) 300 MCG/ML injection Inject 1 mL (300 mcg total) into the skin once a week. Patient taking differently: Inject 300 mcg into the skin as needed (neutropenia).  12/04/14  Yes Eliezer Bottom, NP  folic acid (FOLVITE) 1 MG tablet Give 1 mg by tube every morning. 8:00am per G tube   Yes Historical Provider, MD  furosemide (LASIX) 10 MG/ML solution 20 mg by Per J Tube route daily as needed for fluid or edema.    Yes Historical Provider, MD  gabapentin (NEURONTIN) 250 MG/5ML solution Take 18 mL 3 times daily Patient taking differently: Place 900 mg into feeding tube 3 (three) times daily. Take 18 mL 3 times daily 12/09/14  Yes Rockwell Germany, NP  glycopyrrolate (ROBINUL) 1 MG tablet Place 1 tablet (1 mg total) into feeding tube 3 (three) times daily as needed (excessive secretions). 01/06/15  Yes Shanker Kristeen Mans, MD  Heparin Lock Flush (HEPARIN FLUSH, PORCINE,) 100 UNIT/ML injection 5 mLs (500 Units total) by Intracatheter route as needed (Prior to de-accessing port). 10/25/14  Yes Volanda Napoleon, MD  ibuprofen  (ADVIL,MOTRIN) 100 MG/5ML suspension 400 mg by Gastric Tube route every 6 (six) hours as needed for fever (pain).    Yes Historical Provider, MD  ketoconazole (NIZORAL) 2 % cream Apply 1 application topically 2 (two) times daily as needed for irritation.   Yes Historical Provider, MD  lidocaine (XYLOCAINE) 5 % ointment APPLY AS DIRECTED 30 minutes PRIOR TO BLOOD DRAWS 09/23/14  Yes Historical Provider, MD  metoCLOPramide (REGLAN) 5 MG/5ML solution Place 10 mLs (10 mg total) into feeding tube 4 (four) times daily -  before meals and at bedtime. 07/26/14  Yes Kelvin Cellar, MD  midazolam (VERSED) 5 MG/ML injection Place 2 mLs (10 mg total) into the nose once. Draw up 100ml in 2 syringes. Remove blue vial access device. Attach syringe to nasal atomizer for intranasal administration. Give 62ml in right nostril x 2 for seizures lasting 2 minutes or longer or for repetitive seizures in a short period of time. Patient taking differently: Place 10 mg into the nose as needed (for seizure lasting 2 minutes or longer or repetitive seizures - no more than 1 dose in 24 hours (if unable to give diastat)). Draw up 19ml in 2 syringes. Remove blue vial access device. Attach syringe to nasal atomizer for intranasal administration. Give 17ml in right nostril x 2 for seizures lasting 2 minutes or longer or for repetitive seizures in a short period of time. 11/05/13  Yes Rockwell Germany, NP  Multiple Vitamin (MULTIVITAMIN) LIQD 10 mLs by Gastric Tube route 2 (two) times daily.  Yes Historical Provider, MD  Multiple Vitamins-Minerals (ZINC PO) 5 mLs by Gastric Tube route 2 (two) times daily. 244 mg / 5 mL zinc solution compounded by [facility]   Yes Historical Provider, MD  mupirocin ointment (BACTROBAN) 2 % Apply 1 application topically as needed (to G-T site). Patient taking differently: Apply 1 application topically 3 (three) times daily as needed (for redness at the G-T site).  09/07/12  Yes Lafayette Dragon, MD  Nutritional  Supplements (PROMOD) LIQD Give 40 mLs by tube 2 (two) times daily. 8am and 5pm   Yes Historical Provider, MD  Nutritional Supplements (TWOCAL HN) LIQD 237 mLs by Per J Tube route See admin instructions. T.3 can at 46 cc hour x 12 hours - Gatorade 172 cc pre and post each and extra 172 bid.   Yes Historical Provider, MD  omeprazole (PRILOSEC) 2 mg/mL SUSP Take 20 mg by mouth 2 (two) times daily.   Yes Historical Provider, MD  OnabotulinumtoxinA (BOTOX IJ) Inject as directed See admin instructions. Every 3 months   Yes Historical Provider, MD  ONFI 2.5 MG/ML solution Give 1.64ml via gastrostomy tube at 8 AM, 2 PM and 8 PM 11/14/14  Yes Rockwell Germany, NP  OVER THE COUNTER MEDICATION 2 capsules by Per J Tube route 3 (three) times daily.   Yes Historical Provider, MD  OXYGEN-HELIUM IN Inhale 3 L into the lungs as needed (o2 at 90%). Oxygen PRN to keep O2 Sat at 90%   Yes Historical Provider, MD  potassium chloride (KLOR-CON) 20 MEQ packet Give 20 meq daily except twice daily with lasix, given through J port Patient taking differently: Take 20 mEq by mouth daily. Give an additional dose when taking lasix 07/26/14  Yes Kelvin Cellar, MD  Pseudoephedrine HCl (SUDAFED CHILDRENS) 15 MG/5ML LIQD 30 mg by Gastric Tube route 3 (three) times daily. 8am, 2pm, 8pm   Yes Historical Provider, MD  sodium chloride 0.9 % injection Flush port after use with 10cc NS. Disp# 10 - 10cc syringes 10/25/14  Yes Volanda Napoleon, MD  sodium phosphate (FLEET) enema Place 1 enema rectally daily as needed (gas buildup/ bloating).    Yes Historical Provider, MD  sucralfate (CARAFATE) 1 GM/10ML suspension 1 g by Gastric Tube route 4 (four) times daily. 7am, 12pm, 5pm, 7pm Administer alone 30 minutes prior to other medications.   Yes Historical Provider, MD  Zinc Oxide (BALMEX EX) Apply 1 application topically See admin instructions. Apply to sacral with each diaper change   Yes Historical Provider, MD  Amino Acids-Protein Hydrolys  (FEEDING SUPPLEMENT, PRO-STAT SUGAR FREE 64,) LIQD Take 30 mLs by mouth 2 (two) times daily. Patient not taking: Reported on 01/19/2015 06/27/14   Velvet Bathe, MD   Allergies  Allergen Reactions  . Depakote [Divalproex Sodium] Other (See Comments)    Causes pancreatitis   . Keppra [Levetiracetam] Other (See Comments)    Bone marrow suppression  . Vimpat [Lacosamide] Rash  . Adhesive [Tape] Other (See Comments)    Rips skin off (paper tape is ok)  . Neulasta [Pegfilgrastim] Other (See Comments)    Fever, tachycardia    FAMILY HISTORY:  family history is not on file. He was adopted. SOCIAL HISTORY:  reports that he has never smoked. He has never used smokeless tobacco. He reports that he does not drink alcohol or use illicit drugs.  REVIEW OF SYSTEMS:   Unable to obtain as the patient is nonverbal.  SUBJECTIVE: Unable to obtain as the patient is  nonverbal.  VITAL SIGNS: Temp:  [103.9 F (39.9 C)] 103.9 F (39.9 C) (12/11 2009) Pulse Rate:  [53-158] 136 (12/11 2130) Resp:  [21-52] 45 (12/11 2130) BP: (59-112)/(14-69) 96/49 mmHg (12/11 2130) SpO2:  [97 %-99 %] 97 % (12/11 2130) FiO2 (%):  [60 %] 60 % (12/11 1915)  PHYSICAL EXAMINATION: General: 24yo caucasian male, who has cerebral palsy with contractures. He appears to be in acute respiratory distress.  Neuro:  Patient does not adhere to neurologic testing. He opens his eyes spontaenously. PERRL. Contractures noted on upper and lower extremities.  HEENT: Head enlarged relative to the rest of body, atraumatic. Ears symmetric, permeable. Eyes: no conjunctival icterus, no erythema. Nose: permeable, midline septum, Could not assess oropharynx as the patient is on BiPAP  Cardiovascular: S1S2 tachycardic no murmurs, rubs, or gallops auscultated. No thrills palpated.  Lungs:  Chest symmetrical with respirations, some scattered rhonchi noted bilaterally and diffusely. Shallow breath sounds.  Abdomen:  Soft, rotund. nontender, no  guarding, nondistended. Present bowel sounds. Musculoskeletal:  muscle atrophy  On all extremities with contractures on upper and lower extemities, particularly distally.  Skin:  No notable scars, rashes, patient has a port on his right upper chest. Dressing appears clean. No erythema around insertion site. J-tube on abdominal wall.  He has a sacral decubitus ulcer.  Lymphatics: no palpable lymphadenopathy of cervical, supraclvicular nor inguinal areas.      Recent Labs Lab 01/06/15 0540 01/07/15 0450 02/01/2015 1921 01/18/2015 1935 01/02/2015 2057  NA 141 143 170* 171* 172*  K 3.1* 4.1 5.2* 5.0 4.5  CL 102 106 >130* 129* 129*  CO2 26 23 26   --   --   BUN 30* 29* 59* 60* 54*  CREATININE 1.30* 1.36* 1.73* 1.70* 1.70*  GLUCOSE 87 71 115* 111* 125*    Recent Labs Lab 01/06/15 0540 01/07/15 0450 01/21/2015 1921 01/23/2015 1935 01/21/2015 2057  HGB 10.5* 10.1* 11.7* 12.6* 10.5*  HCT 34.9* 34.1* 41.5 37.0* 31.0*  WBC 6.7 4.2 5.9  --   --   PLT 134* 123* 219  --   --    Dg Chest Portable 1 View  02/01/2015  CLINICAL DATA:  Fever. EXAM: PORTABLE CHEST 1 VIEW COMPARISON:  December 3rd 2016. FINDINGS: Stable chronic low lung volumes are noted. Stable surgical fusion of thoraco lumbar spine is noted. No pneumothorax is noted. Increased bibasilar linear densities are noted most consistent with worsening subsegmental atelectasis or possibly pneumonia. Cardiomediastinal silhouette is unchanged. IMPRESSION: Stable chronic low lung volumes are noted. Increased bibasilar linear densities are noted most consistent with worsening subsegmental atelectasis or possibly pneumonia. Electronically Signed   By: Marijo Conception, M.D.   On: 01/23/2015 20:01    ASSESSMENT / PLAN: Acute hypoxemic respiratory failure on BiPAP BLL infiltrates on new CXR not present on CXR from 12/3, possibly aspiration PNA, on fortaz and zyvox DNR/no intubation. Father states that they would not want chest compressions if his heart  were to stop. Unfortunately, we do not have any other recommendations to add to the care of this unfortunate patient.  Could potentially wean the IPAP and EPAP. Titrate BiPAP as tolerated.   Hypernatremia, hyperchloremia, lactic acidosis, AKI, to be managed by hospitalist service.   Appreciate this consult. Care discussed with Dr. Loleta Books. We both agree with a palliative care consult.  Critical care time spent titrating BiPAP, reviewing history and labs, CXR, ABG intepretation, discussions with father at bedside and with team 35 minutes.   Pulmonary and Critical Care  [place] Pager: [phone]  01/11/2015, 10:39 PM

## 2015-01-12 NOTE — Progress Notes (Signed)
Patient transferred from ED to [facility] without complications. RT will continue to monitor.

## 2015-01-12 NOTE — Progress Notes (Signed)
RT placed patient on BIPAP per MD verbal order for WOB. Patient very Tachypnea and wears BIPAP at home. RT will continue to monitor.

## 2015-01-12 NOTE — Progress Notes (Signed)
ANTIBIOTIC CONSULT NOTE - INITIAL  Pharmacy Consult for ceftaz Indication: sepsis  Allergies  Allergen Reactions  . Depakote [Divalproex Sodium] Other (See Comments)    Causes pancreatitis   . Keppra [Levetiracetam] Other (See Comments)    Bone marrow suppression  . Vimpat [Lacosamide] Rash  . Adhesive [Tape] Other (See Comments)    Rips skin off (paper tape is ok)  . Neulasta [Pegfilgrastim] Other (See Comments)    Fever, tachycardia    Patient Measurements:     Vital Signs: Temp: 103.9 F (39.9 C) (12/11 2009) Temp Source: Rectal (12/11 2009) BP: 99/69 mmHg (12/11 1945) Pulse Rate: 154 (12/11 1945) Intake/Output from previous day:   Intake/Output from this shift:    Labs:  Recent Labs  01/10/2015 1921 01/05/2015 1935  WBC 5.9  --   HGB 11.7* 12.6*  PLT 219  --   CREATININE 1.73* 1.70*   Estimated Creatinine Clearance: 50.6 mL/min (by C-G formula based on Cr of 1.7). No results for input(s): VANCOTROUGH, VANCOPEAK, VANCORANDOM, GENTTROUGH, GENTPEAK, GENTRANDOM, TOBRATROUGH, TOBRAPEAK, TOBRARND, AMIKACINPEAK, AMIKACINTROU, AMIKACIN in the last 72 hours.   Microbiology: Recent Results (from the past 720 hour(s))  Blood Culture (routine x 2)     Status: None   Collection Time: 12/31/14  2:51 PM  Result Value Ref Range Status   Specimen Description BLOOD PORTA CATH  Final   Special Requests BOTTLES DRAWN AEROBIC AND ANAEROBIC 6CC   Final   Culture NO GROWTH 5 DAYS  Final   Report Status 01/05/2015 FINAL  Final  Urine culture     Status: None   Collection Time: 12/31/14  2:54 PM  Result Value Ref Range Status   Specimen Description URINE, CATHETERIZED  Final   Special Requests NONE  Final   Culture MULTIPLE SPECIES PRESENT, SUGGEST RECOLLECTION  Final   Report Status 01/01/2015 FINAL  Final  Blood Culture (routine x 2)     Status: None   Collection Time: 12/31/14  3:50 PM  Result Value Ref Range Status   Specimen Description BLOOD LEFT HAND  Final   Special  Requests BOTTLES DRAWN AEROBIC AND ANAEROBIC 5CC  Final   Culture NO GROWTH 5 DAYS  Final   Report Status 01/05/2015 FINAL  Final  MRSA PCR Screening     Status: None   Collection Time: 12/31/14  7:17 PM  Result Value Ref Range Status   MRSA by PCR NEGATIVE NEGATIVE Final    Comment:        The GeneXpert MRSA Assay (FDA approved for NASAL specimens only), is one component of a comprehensive MRSA colonization surveillance program. It is not intended to diagnose MRSA infection nor to guide or monitor treatment for MRSA infections.   Urine culture     Status: None   Collection Time: 01/02/15  5:14 AM  Result Value Ref Range Status   Specimen Description URINE, RANDOM  Final   Special Requests NONE  Final   Culture 10,000 COLONIES/mL YEAST  Final   Report Status 01/03/2015 FINAL  Final  C difficile quick scan w PCR reflex     Status: None   Collection Time: 01/04/15  4:01 PM  Result Value Ref Range Status   C Diff antigen NEGATIVE NEGATIVE Final   C Diff toxin NEGATIVE NEGATIVE Final   C Diff interpretation Negative for toxigenic C. difficile  Final    Medical History: Past Medical History  Diagnosis Date  . CP (cerebral palsy), spastic, quadriplegic ([place])   . Osteoporosis   .  Undescended testes   . Seasonal allergies   . IVH (intraventricular hemorrhage) ([place]) February 11, 1990    Grade IV  . Hip dislocation, bilateral ([place])   . Dysphagia   . Retinopathy of prematurity   . 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 Neuromuscular scoliosis   . Osteoporosis   . Complex partial seizures ([place])   . Generalized convulsive epilepsy without mention of intractable epilepsy   . Sinus bradycardia     HR drops to 38-40 while sleeping  . Blister of right heel     fluid filled; origin unknown  . Kidney stones     ?  Marland Kitchen Pneumonia      chronic pneumonia ,respitory failure dx Augest 2014  . Aspiration pneumonia ([place])     "chronic" (04/12/2014)  . OSA treated with BiPAP     "since age 59"    . Anemia   . 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 GERD (gastroesophageal reflux disease)   . Epilepsy ([place])   . 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 Neutropenia ([place]) 07/03/2014    Assessment: 24 year old male recently discharged on IV abx for 6 weeks of vanc/ertapenem per ID for neutropenic fever / osteo of L ischium and decubitis ulcer. Was supratherapeutic (lvl 54) on discharge last week due to sharp rise in SCr and low muscle mass.  Pharmacy now consulted to dose ceftazidime for sepsis. Linezolid ordered per MD. Tmax/24h 103.9, wbc wnl. SCr up to 1.7 (0.5 on admit last week and up to 1.36 on d/c). Probably can't trust CrCl with low muscle mass.  12/11 ceftaz>> 12/11 linezolid>>  12/11 BCx2>> 12/11 UC>>  Goal of Therapy:  Eradication of infection  Plan:  Ceftaz 2g IV q12h - may need to empirically lower dose if SCr continues to rise Linezolid 600mg  IV q12h per MD Monitor clinical progress, c/s, renal function, abx plan/LOT  Elicia Lamp, PharmD, [facility] Clinical Pharmacist Pager [phone] 01/11/2015 8:32 PM

## 2015-01-12 NOTE — ED Provider Notes (Signed)
CSN: [id number]     Arrival date & time 01/06/2015  1900 History   First MD Initiated Contact with Patient 01/14/2015 1902     Chief Complaint  Patient presents with  . Altered Mental Status     Level V caveat due to nonverbal status. Patient is a 24 y.o. male presenting with altered mental status. The history is provided by the patient and a relative.  Altered Mental Status  patient was brought in for fever. Recent discharge for sepsis from osteomyelitis. He is on Invanz through his line at home. Has had fevers up to 101 reportedly. Has been breathing faster especially today. Mild hypoxia. 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 Mother is reportedly concerned about aspiration.  Past Medical History  Diagnosis Date  . 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 Osteoporosis   . Undescended testes   . Seasonal allergies   . IVH (intraventricular hemorrhage) ([place]) 24-Aug-1990    Grade IV  . Hip dislocation, bilateral ([place])   . Dysphagia   . Retinopathy of prematurity   . 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 Neuromuscular scoliosis   . Osteoporosis   . Complex partial seizures ([place])   . Generalized convulsive epilepsy without mention of intractable epilepsy   . Sinus bradycardia     HR drops to 38-40 while sleeping  . Blister of right heel     fluid filled; origin unknown  . Kidney stones     ?  Marland Kitchen Pneumonia      chronic pneumonia ,respitory failure dx Augest 2014  . Aspiration pneumonia ([place])     "chronic" (04/12/2014)  . OSA treated with BiPAP     "since age 19"   . Anemia   . 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 GERD (gastroesophageal reflux disease)   . Epilepsy ([place])   . 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 Neutropenia ([place]) 07/03/2014   Past Surgical History  Procedure Laterality Date  . Mole removal  "several B/T 2008-2010"    "from all over"  . Jejunostomy feeding  tube  03/08/2013; ~ 09/2013; ~ 01/2014    "transgastric-jujunal feeding tube"  . Gastrostomy tube placement  11./02/1998       . Button change  12/15/2010    Procedure: BUTTON CHANGE;  Surgeon: Gatha Mayer, MD;  Location: [facility];  Service: Endoscopy;  Laterality: N/A;  . Peg placement  10/07/2011    Procedure: PERCUTANEOUS ENDOSCOPIC GASTROSTOMY (PEG) REPLACEMENT;  Surgeon: Lafayette Dragon, MD;  Location: [facility];  Service: Endoscopy;  Laterality: N/A;  Needs 18 F 2.5 button ordered-dl  . Inguinal hernia repair Bilateral 1992  . Retinopathy of prematurity surgery  1992  . Achilles tendon lengthening Bilateral 12/1998  . Tendon release  12/1998    Soft tissue releases  wrists and fingers [Other]  . Infusion pump implantation  07/25/2000; 2013    Intrathecal baclofen pump  . Peg placement N/A 06/05/2012    Procedure: PERCUTANEOUS ENDOSCOPIC GASTROSTOMY (PEG) REPLACEMENT;  Surgeon: Lafayette Dragon, MD;  Location: [facility];  Service: Endoscopy;  Laterality: N/A;  button 41fr.2.5cm  . Strabismus surgery Bilateral 1993    "3 on right; 2 on left"  . Back surgery  ~ 2008    Harrington Rods in back needs to be log rolled  . Tendon repair Bilateral 09/05/2012    Procedure: LENGTHENING OF DIGITAL FLEXOR  TENDONS BILTERAL HANDS;  Surgeon: Jolyn Nap, MD;  Location: [facility];  Service: Orthopedics;  Laterality: Bilateral;  . Peg placement N/A 09/13/2012    Procedure: PERCUTANEOUS ENDOSCOPIC GASTROSTOMY (PEG) REPLACEMENT;  Surgeon: Lafayette Dragon, MD;  Location: [facility];  Service: Endoscopy;  Laterality: N/A;  . Flexible sigmoidoscopy N/A 10/30/2012    Procedure: FLEXIBLE SIGMOIDOSCOPY;  Surgeon: Cleotis Nipper, MD;  Location: [facility];  Service: Endoscopy;  Laterality: N/A;  . Peg placement N/A 11/15/2012    Procedure: PERCUTANEOUS ENDOSCOPIC GASTROSTOMY (PEG) REPLACEMENT;  Surgeon: Jeryl Columbia, MD;  Location: [facility] END[facility]COPY;  Service: Endoscopy;  Laterality: N/A;  . Gastrostomy tube  placement  11./02/1998  . Rectal biopsy  10/29/2012    S/P diarrhea from Vancomycin  . Portacath placement Right 11/25/2012    chest  . Eye surgery     Family History  Problem Relation Age of Onset  . Adopted: Yes   Social History  Substance Use Topics  . Smoking status: Never Smoker   . Smokeless tobacco: Never Used     Comment: never used tobacco  . Alcohol Use: No    Review of Systems    Allergies  Depakote; Keppra; Vimpat; Adhesive; and Neulasta  Home Medications   Prior to Admission medications   Medication Sig Start Date End Date Taking? Authorizing Provider  acetaminophen (TYLENOL) 160 MG/5ML solution Place 500 mg into feeding tube every 6 (six) hours as needed for moderate pain or fever.    Yes Historical Provider, MD  acetaminophen (TYLENOL) 325 MG suppository Place 650 mg rectally every 6 (six) hours as needed for fever.   Yes Historical Provider, MD  albuterol (PROVENTIL HFA;VENTOLIN HFA) 108 (90 BASE) MCG/ACT inhaler Inhale 2 puffs into the lungs every 4 (four) hours as needed for shortness of breath.   Yes Historical Provider, MD  albuterol (PROVENTIL) (2.5 MG/3ML) 0.083% nebulizer solution Take 2.5 mg by nebulization See admin instructions. Give 1 vial (2.5 mg) twice daily (8am and 8pm) and every 4 hours as needed for shortness of breath or wheezing 10/11/12  Yes Elsie Stain, MD  baclofen (GABLOFEN) 40000 MCG/20ML SOLN by Intrathecal route continuous. 385.2 mcg in 24 hours 05/04/12  Yes Jodi Geralds, MD  bag balm OINT ointment Apply 1 application topically See admin instructions. Apply to sacral area with each diaper change   Yes Historical Provider, MD  calcium carbonate, dosed in mg elemental calcium, 1250 MG/5ML Place 1,250 mg into feeding tube 3 (three) times daily. 8am , 2pm, and 8pm   Yes Historical Provider, MD  Cholecalciferol (VITAMIN D3) 400 UNIT/ML LIQD Give 2.5 mLs by tube daily. 1000mg  daily 10/02/14  Yes Historical Provider, MD  DIASTAT ACUDIAL  20 MG GEL Place 12.5 mg rectally as needed (for seizure lasting 2 minutes or longer or repetitive seizures - no more than 1 dose in 12 hours (not given with nasal versed)).  04/01/14  Yes Historical Provider, MD  dicyclomine (BENTYL) 10 MG/5ML syrup Place 10 mg into feeding tube every 8 (eight) hours as needed (abdominal cramps). Do not exceed 5 days in one week   Yes Historical Provider, MD  diphenhydrAMINE (BENADRYL) 12.5 MG/5ML liquid Place 25 mg into feeding tube 4 (four) times daily as needed (30 min before Neupogen).    Yes Historical Provider, MD  diphenoxylate-atropine (LOMOTIL) 2.5-0.025 MG/5ML liquid 10 mLs by Gastric Tube route every 6 (six) hours as needed for diarrhea or loose stools.    Yes Historical Provider, MD  filgrastim (NEUPOGEN) 300 MCG/ML injection Inject 1 mL (300 mcg total) into the skin once a week. Patient taking differently: Inject 300 mcg into the skin as needed (neutropenia).  12/04/14  Yes Eliezer Bottom, NP  folic acid (FOLVITE) 1 MG tablet Give 1 mg by tube every morning. 8:00am per G tube   Yes Historical Provider, MD  furosemide (LASIX) 10 MG/ML solution 20 mg by Per J Tube route daily as needed for fluid or edema.    Yes Historical Provider, MD  gabapentin (NEURONTIN) 250 MG/5ML solution Take 18 mL 3 times daily Patient taking differently: Place 900 mg into feeding tube 3 (three) times daily. Take 18 mL 3 times daily 12/09/14  Yes Rockwell Germany, NP  glycopyrrolate (ROBINUL) 1 MG tablet Place 1 tablet (1 mg total) into feeding tube 3 (three) times daily as needed (excessive secretions). 01/06/15  Yes Shanker Kristeen Mans, MD  Heparin Lock Flush (HEPARIN FLUSH, PORCINE,) 100 UNIT/ML injection 5 mLs (500 Units total) by Intracatheter route as needed (Prior to de-accessing port). 10/25/14  Yes Volanda Napoleon, MD  ibuprofen (ADVIL,MOTRIN) 100 MG/5ML suspension 400 mg by Gastric Tube route every 6 (six) hours as needed for fever (pain).    Yes Historical Provider, MD   ketoconazole (NIZORAL) 2 % cream Apply 1 application topically 2 (two) times daily as needed for irritation.   Yes Historical Provider, MD  lidocaine (XYLOCAINE) 5 % ointment APPLY AS DIRECTED 30 minutes PRIOR TO BLOOD DRAWS 09/23/14  Yes Historical Provider, MD  metoCLOPramide (REGLAN) 5 MG/5ML solution Place 10 mLs (10 mg total) into feeding tube 4 (four) times daily -  before meals and at bedtime. 07/26/14  Yes Kelvin Cellar, MD  midazolam (VERSED) 5 MG/ML injection Place 2 mLs (10 mg total) into the nose once. Draw up 96ml in 2 syringes. Remove blue vial access device. Attach syringe to nasal atomizer for intranasal administration. Give 68ml in right nostril x 2 for seizures lasting 2 minutes or longer or for repetitive seizures in a short period of time. Patient taking differently: Place 10 mg into the nose as needed (for seizure lasting 2 minutes or longer or repetitive seizures - no more than 1 dose in 24 hours (if unable to give diastat)). Draw up 66ml in 2 syringes. Remove blue vial access device. Attach syringe to nasal atomizer for intranasal administration. Give 54ml in right nostril x 2 for seizures lasting 2 minutes or longer or for repetitive seizures in a short period of time. 11/05/13  Yes Rockwell Germany, NP  Multiple Vitamin (MULTIVITAMIN) LIQD 10 mLs by Gastric Tube route 2 (two) times daily.    Yes Historical Provider, MD  Multiple Vitamins-Minerals (ZINC PO) 5 mLs by Gastric Tube route 2 (two) times daily. 244 mg / 5 mL zinc solution compounded by [facility]   Yes Historical Provider, MD  mupirocin ointment (BACTROBAN) 2 % Apply 1 application topically as needed (to G-T site). Patient taking differently: Apply 1 application topically 3 (three) times daily as needed (for redness at the G-T site).  09/07/12  Yes Lafayette Dragon, MD  Nutritional Supplements (PROMOD) LIQD Give 40 mLs by tube 2 (two) times daily. 8am and 5pm   Yes Historical Provider, MD  Nutritional Supplements (TWOCAL  HN) LIQD 237 mLs by Per J Tube route See admin instructions. T.3 can at 46 cc hour x 12 hours - Gatorade 172 cc pre and post each and extra 172 bid.   Yes Historical Provider, MD  omeprazole (PRILOSEC) 2 mg/mL SUSP Take 20 mg by mouth 2 (two) times daily.   Yes Historical Provider, MD  OnabotulinumtoxinA (BOTOX IJ) Inject as directed See admin instructions. Every 3 months   Yes Historical Provider, MD  ONFI 2.5 MG/ML solution Give 1.45ml via gastrostomy tube at 8 AM, 2 PM and 8 PM 11/14/14  Yes Rockwell Germany, NP  OVER THE COUNTER MEDICATION 2 capsules by Per J Tube route 3 (three) times daily.   Yes Historical Provider, MD  OXYGEN-HELIUM IN Inhale 3 L into the lungs as needed (o2 at 90%). Oxygen PRN to keep O2 Sat at 90%   Yes Historical Provider, MD  potassium chloride (KLOR-CON) 20 MEQ packet Give 20 meq daily except twice daily with lasix, given through J port Patient taking differently: Take 20 mEq by mouth daily. Give an additional dose when taking lasix 07/26/14  Yes Kelvin Cellar, MD  Pseudoephedrine HCl (SUDAFED CHILDRENS) 15 MG/5ML LIQD 30 mg by Gastric Tube route 3 (three) times daily. 8am, 2pm, 8pm   Yes Historical Provider, MD  sodium chloride 0.9 % injection Flush port after use with 10cc NS. Disp# 10 - 10cc syringes 10/25/14  Yes Volanda Napoleon, MD  sodium phosphate (FLEET) enema Place 1 enema rectally daily as needed (gas buildup/ bloating).    Yes Historical Provider, MD  sucralfate (CARAFATE) 1 GM/10ML suspension 1 g by Gastric Tube route 4 (four) times daily. 7am, 12pm, 5pm, 7pm Administer alone 30 minutes prior to other medications.   Yes Historical Provider, MD  Zinc Oxide (BALMEX EX) Apply 1 application topically See admin instructions. Apply to sacral with each diaper change   Yes Historical Provider, MD  Amino Acids-Protein Hydrolys (FEEDING SUPPLEMENT, PRO-STAT SUGAR FREE 64,) LIQD Take 30 mLs by mouth 2 (two) times daily. Patient not taking: Reported on 01/02/2015 06/27/14    Velvet Bathe, MD   BP 102/68 mmHg  Pulse 103  Temp(Src) 98.5 F (36.9 C) (Rectal)  Resp 18  Ht 4\' 11"  (1.499 m)  Wt 129 lb 10.1 oz (58.8 kg)  BMI 26.17 kg/m2  SpO2 98% Physical Exam  Eyes: No scleral icterus.  Neck: No thyromegaly present.  Cardiovascular:  Moderate tachycardia  Pulmonary/Chest:  Tachypnea with some rales at bilateral bases.  Abdominal: There is no tenderness.  Baclofen pump to right lower quadrant. No right upper quadrant tenderness. Mild distention.  Musculoskeletal: He exhibits no edema.  Neurological:  Minimal response to pain.  Skin: Skin is warm. He is not diaphoretic.    ED Course  Procedures (including critical care time) Labs Review Labs Reviewed  COMPREHENSIVE METABOLIC PANEL - Abnormal; Notable for the following:    Sodium 170 (*)    Potassium 5.2 (*)    Chloride >130 (*)    Glucose, Bld 115 (*)    BUN 59 (*)    Creatinine, Ser 1.73 (*)    Calcium 8.4 (*)    Total Protein 5.2 (*)    Albumin 2.1 (*)    AST 979 (*)    ALT 414 (*)    Alkaline Phosphatase 1198 (*)    GFR calc non Af Amer 54 (*)    All other components within normal limits  CBC WITH DIFFERENTIAL/PLATELET - Abnormal; Notable for the following:    RBC 3.59 (*)    Hemoglobin 11.7 (*)    MCV 115.6 (*)    MCHC 28.2 (*)    RDW 21.4 (*)    Lymphs Abs 0.5 (*)    All  other components within normal limits  URINALYSIS, ROUTINE W REFLEX MICROSCOPIC (NOT AT [facility]) - Abnormal; Notable for the following:    APPearance CLOUDY (*)    Hgb urine dipstick MODERATE (*)    Protein, ur 100 (*)    All other components within normal limits  URINE MICROSCOPIC-ADD ON - Abnormal; Notable for the following:    Squamous Epithelial / LPF 6-30 (*)    Bacteria, UA FEW (*)    All other components within normal limits  LACTIC ACID, PLASMA - Abnormal; Notable for the following:    Lactic Acid, Venous 3.0 (*)    All other components within normal limits  LACTIC ACID, PLASMA - Abnormal; Notable for  the following:    Lactic Acid, Venous 2.6 (*)    All other components within normal limits  CBC - Abnormal; Notable for the following:    RBC 2.86 (*)    Hemoglobin 9.1 (*)    HCT 33.5 (*)    MCV 117.1 (*)    MCHC 27.2 (*)    RDW 20.0 (*)    Platelets 111 (*)    All other components within normal limits  CREATININE, SERUM - Abnormal; Notable for the following:    Creatinine, Ser 1.66 (*)    GFR calc non Af Amer 56 (*)    All other components within normal limits  COMPREHENSIVE METABOLIC PANEL - Abnormal; Notable for the following:    Sodium 162 (*)    Chloride >130 (*)    Glucose, Bld 150 (*)    BUN 51 (*)    Creatinine, Ser 1.58 (*)    Calcium 6.4 (*)    Total Protein 3.4 (*)    Albumin 1.3 (*)    AST 185 (*)    ALT 178 (*)    Alkaline Phosphatase 741 (*)    GFR calc non Af Amer 60 (*)    All other components within normal limits  CBC - Abnormal; Notable for the following:    RBC 2.53 (*)    Hemoglobin 8.0 (*)    HCT 29.7 (*)    MCV 117.4 (*)    MCHC 26.9 (*)    RDW 20.0 (*)    Platelets 98 (*)    All other components within normal limits  GLUCOSE, CAPILLARY - Abnormal; Notable for the following:    Glucose-Capillary 139 (*)    All other components within normal limits  OCCULT BLOOD X 1 CARD TO LAB, STOOL - Abnormal; Notable for the following:    Fecal Occult Bld POSITIVE (*)    All other components within normal limits  GLUCOSE, CAPILLARY - Abnormal; Notable for the following:    Glucose-Capillary 115 (*)    All other components within normal limits  BASIC METABOLIC PANEL - Abnormal; Notable for the following:    Sodium 150 (*)    Chloride 120 (*)    CO2 20 (*)    Glucose, Bld 161 (*)    BUN 52 (*)    Creatinine, Ser 1.83 (*)    Calcium 7.1 (*)    GFR calc non Af Amer 50 (*)    GFR calc Af Amer 58 (*)    All other components within normal limits  GLUCOSE, CAPILLARY - Abnormal; Notable for the following:    Glucose-Capillary 127 (*)    All other  components within normal limits  GLUCOSE, CAPILLARY - Abnormal; Notable for the following:    Glucose-Capillary 114 (*)    All other components  within normal limits  BASIC METABOLIC PANEL - Abnormal; Notable for the following:    Sodium 149 (*)    Chloride 116 (*)    CO2 21 (*)    Glucose, Bld 116 (*)    BUN 51 (*)    Creatinine, Ser 1.75 (*)    Calcium 7.4 (*)    GFR calc non Af Amer 53 (*)    All other components within normal limits  CBC - Abnormal; Notable for the following:    RBC 3.90 (*)    Hemoglobin 11.8 (*)    MCV 100.8 (*)    RDW 23.0 (*)    Platelets 86 (*)    All other components within normal limits  BASIC METABOLIC PANEL - Abnormal; Notable for the following:    Sodium 146 (*)    Chloride 115 (*)    CO2 20 (*)    BUN 46 (*)    Creatinine, Ser 1.57 (*)    Calcium 7.4 (*)    All other components within normal limits  GLUCOSE, CAPILLARY - Abnormal; Notable for the following:    Glucose-Capillary 53 (*)    All other components within normal limits  GLUCOSE, CAPILLARY - Abnormal; Notable for the following:    Glucose-Capillary 61 (*)    All other components within normal limits  BASIC METABOLIC PANEL - Abnormal; Notable for the following:    Sodium 152 (*)    Potassium 3.0 (*)    Chloride 119 (*)    Glucose, Bld 105 (*)    BUN 40 (*)    Creatinine, Ser 1.48 (*)    Calcium 8.0 (*)    All other components within normal limits  CBC - Abnormal; Notable for the following:    RBC 3.28 (*)    Hemoglobin 10.1 (*)    HCT 32.2 (*)    RDW 21.7 (*)    Platelets 68 (*)    All other components within normal limits  GLUCOSE, CAPILLARY - Abnormal; Notable for the following:    Glucose-Capillary 47 (*)    All other components within normal limits  GLUCOSE, CAPILLARY - Abnormal; Notable for the following:    Glucose-Capillary 107 (*)    All other components within normal limits  BASIC METABOLIC PANEL - Abnormal; Notable for the following:    Sodium 151 (*)     Potassium 2.6 (*)    Chloride 119 (*)    Glucose, Bld 122 (*)    BUN 37 (*)    Creatinine, Ser 1.48 (*)    Calcium 7.8 (*)    All other components within normal limits  GLUCOSE, CAPILLARY - Abnormal; Notable for the following:    Glucose-Capillary 115 (*)    All other components within normal limits  GLUCOSE, CAPILLARY - Abnormal; Notable for the following:    Glucose-Capillary 102 (*)    All other components within normal limits  GLUCOSE, CAPILLARY - Abnormal; Notable for the following:    Glucose-Capillary 135 (*)    All other components within normal limits  I-STAT CHEM 8, ED - Abnormal; Notable for the following:    Sodium 171 (*)    Chloride 129 (*)    BUN 60 (*)    Creatinine, Ser 1.70 (*)    Glucose, Bld 111 (*)    Hemoglobin 12.6 (*)    HCT 37.0 (*)    All other components within normal limits  I-STAT CG4 LACTIC ACID, ED - Abnormal; Notable for the following:  Lactic Acid, Venous 5.21 (*)    All other components within normal limits  I-STAT CHEM 8, ED - Abnormal; Notable for the following:    Sodium 172 (*)    Chloride 129 (*)    BUN 54 (*)    Creatinine, Ser 1.70 (*)    Glucose, Bld 125 (*)    Calcium, Ion 1.10 (*)    Hemoglobin 10.5 (*)    HCT 31.0 (*)    All other components within normal limits  I-STAT ARTERIAL BLOOD GAS, ED - Abnormal; Notable for the following:    pH, Arterial 7.320 (*)    pCO2 arterial 48.1 (*)    pO2, Arterial 143.0 (*)    All other components within normal limits  CULTURE, BLOOD (ROUTINE X 2)  CULTURE, BLOOD (ROUTINE X 2)  URINE CULTURE  C DIFFICILE QUICK SCREEN W PCR REFLEX  VANCOMYCIN, RANDOM  MAGNESIUM  VANCOMYCIN, RANDOM  GLUCOSE, CAPILLARY  GLUCOSE, CAPILLARY  GLUCOSE, CAPILLARY  GLUCOSE, CAPILLARY  GLUCOSE, CAPILLARY  GLUCOSE, CAPILLARY  GLUCOSE, CAPILLARY  GLUCOSE, CAPILLARY  GLUCOSE, CAPILLARY  GLUCOSE, CAPILLARY  GLUCOSE, CAPILLARY  GLUCOSE, CAPILLARY  TYPE AND SCREEN  PREPARE RBC (CROSSMATCH)    Imaging  Review US Abdomen Complete  01/13/2015  CLINICAL DATA:  Acute onset of abdominal distention and sepsis. Initial encounter. EXAM: ULTRASOUND ABDOMEN COMPLETE COMPARISON:  Right upper quadrant ultrasound performed 01/05/2015, and CT of the abdomen and pelvis performed 10/15/2014 FINDINGS: Gallbladder: Stones are noted dependently in the gallbladder. The gallbladder wall is borderline prominent. No pericholecystic fluid is seen. Evaluation for ultrasonographic Murphy's sign is not possible as the patient is unconscious. Common bile duct: Diameter: 0.4 cm, within normal limits in caliber. Liver: No focal lesion identified. Within normal limits in parenchymal echogenicity. Difficult to fully assess due to the midline pain pump. IVC: No abnormality visualized. Pancreas: Not visualized. Spleen: Size and appearance within normal limits. Right Kidney: Length: 9.4 cm. Not fully assessed due to overlying bowel gas. Echogenicity within normal limits. No mass or hydronephrosis visualized. Left Kidney: Not fully assessed due to overlying bowel gas. Echogenicity within normal limits. No mass or hydronephrosis visualized. Abdominal aorta: Not visualized due to overlying bowel gas. Other findings: None. IMPRESSION: 1. No acute abnormality seen within the abdomen. Evaluation is significantly suboptimal due to bowel gas. 2. Cholelithiasis. Borderline prominent gallbladder wall. Gallbladder otherwise grossly unremarkable. Electronically Signed   By: Garald Balding M.D.   On: 01/13/2015 23:07   Dg Chest Port 1 View  01/15/2015  CLINICAL DATA:  Pneumonia. EXAM: PORTABLE CHEST 1 VIEW COMPARISON:  01/19/2015 FINDINGS: Chronic low lung volumes. Increased densities at the left lung base are concerning for worsening atelectasis or infection. Stable appearance of the heart and mediastinum. Port-A-Cath in the SVC region. Stable appearance of the spinal surgical hardware. IMPRESSION: Increased densities in the left lower lobe are  concerning for worsening atelectasis or infection. Chronic low lung volumes. Electronically Signed   By: Markus Daft M.D.   On: 01/15/2015 13:02   Dg Abd Portable 1v  01/15/2015  CLINICAL DATA:  Abdominal distension. EXAM: PORTABLE ABDOMEN - 1 VIEW COMPARISON:  10/15/2014 FINDINGS: Spinal hardware throughout the thoracolumbar spine extending into the pelvis. Chronic changes involving the pelvic bones and hips. There is a GJ feeding tube and the tip is probably in the jejunal region. Again noted is an implant infusion device in the right lower abdomen. Few gas-filled loops of bowel but nonobstructive pattern. IMPRESSION: No acute abnormality. Electronically Signed   By:  Markus Daft M.D.   On: 01/15/2015 13:08   I have personally reviewed and evaluated these images and lab results as part of my medical decision-making.   EKG Interpretation None      MDM   Final diagnoses:  Septic shock (HCC)  AKI (acute kidney injury) ([place])  Hypernatremia  Elevated liver enzymes   hypernatremia  Patient was brought in for possible aspiration. He is a DO NOT RESUSCITATE. He has a right Port-A-Cath. Recent admission to hospital for infection. Family is worried about pneumonia. X-ray showed possible pneumonia. Found fever up to 13. Hypotension. Also severely hypernatremic. Patient is a DO NOT RESUSCITATE and would not want central line or intubation. Patient started on BiPAP for tachypnea hypoxia. Admit to stepdown. Seen by internal medicine. Has been on abx for osteo. Discussed with ID for medication choices.  CRITICAL CARE Performed by: Mackie Pai Total critical care time: 30 minutes Critical care time was exclusive of separately billable procedures and treating other patients. Critical care was necessary to treat or prevent imminent or life-threatening deterioration. Critical care was time spent personally by me on the following activities: development of treatment plan with patient and/or surrogate  as well as nursing, discussions with consultants, evaluation of patient's response to treatment, examination of patient, obtaining history from patient or surrogate, ordering and performing treatments and interventions, ordering and review of laboratory studies, ordering and review of radiographic studies, pulse oximetry and re-evaluation of patient's condition.    Davonna Belling, MD 01/15/15 [phone]

## 2015-01-12 NOTE — ED Notes (Signed)
Pt family requesting that chaplain be paged.

## 2015-01-13 ENCOUNTER — Inpatient Hospital Stay (HOSPITAL_COMMUNITY): Payer: Medicaid Other

## 2015-01-13 ENCOUNTER — Telehealth: Payer: Self-pay | Admitting: *Deleted

## 2015-01-13 DIAGNOSIS — A419 Sepsis, unspecified organism: Principal | ICD-10-CM

## 2015-01-13 DIAGNOSIS — R6520 Severe sepsis without septic shock: Secondary | ICD-10-CM

## 2015-01-13 LAB — BASIC METABOLIC PANEL
Anion gap: 10 (ref 5–15)
BUN: 52 mg/dL — AB (ref 6–20)
CALCIUM: 7.1 mg/dL — AB (ref 8.9–10.3)
CO2: 20 mmol/L — ABNORMAL LOW (ref 22–32)
CREATININE: 1.83 mg/dL — AB (ref 0.61–1.24)
Chloride: 120 mmol/L — ABNORMAL HIGH (ref 101–111)
GFR calc Af Amer: 58 mL/min — ABNORMAL LOW (ref >60)
GFR, EST NON AFRICAN AMERICAN: 50 mL/min — AB (ref >60)
Glucose, Bld: 161 mg/dL — ABNORMAL HIGH (ref 65–99)
POTASSIUM: 4.1 mmol/L (ref 3.5–5.1)
SODIUM: 150 mmol/L — AB (ref 135–145)

## 2015-01-13 LAB — CBC
HCT: 29.7 % — ABNORMAL LOW (ref 39.0–52.0)
HEMATOCRIT: 33.5 % — AB (ref 39.0–52.0)
Hemoglobin: 9.1 g/dL — ABNORMAL LOW (ref 13.0–17.0)
Hemoglobin: 8 g/dL — ABNORMAL LOW (ref 13.0–17.0)
MCH: 31.8 pg (ref 26.0–34.0)
MCH: 31.6 pg (ref 26.0–34.0)
MCHC: 27.2 g/dL — AB (ref 30.0–36.0)
MCHC: 26.9 g/dL — ABNORMAL LOW (ref 30.0–36.0)
MCV: 117.1 fL — ABNORMAL HIGH (ref 78.0–100.0)
MCV: 117.4 fL — AB (ref 78.0–100.0)
PLATELETS: 111 10*3/uL — AB (ref 150–400)
PLATELETS: 98 10*3/uL — AB (ref 150–400)
RBC: 2.86 MIL/uL — ABNORMAL LOW (ref 4.22–5.81)
RBC: 2.53 MIL/uL — ABNORMAL LOW (ref 4.22–5.81)
RDW: 20 % — AB (ref 11.5–15.5)
RDW: 20 % — AB (ref 11.5–15.5)
WBC: 6 10*3/uL (ref 4.0–10.5)
WBC: 6.3 10*3/uL (ref 4.0–10.5)

## 2015-01-13 LAB — LACTIC ACID, PLASMA
LACTIC ACID, VENOUS: 3 mmol/L — AB (ref 0.5–2.0)
LACTIC ACID, VENOUS: 2.6 mmol/L — AB (ref 0.5–2.0)

## 2015-01-13 LAB — C DIFFICILE QUICK SCREEN W PCR REFLEX
C DIFFICILE (CDIFF) TOXIN: NEGATIVE
C Diff antigen: NEGATIVE
C Diff interpretation: NEGATIVE

## 2015-01-13 LAB — GLUCOSE, CAPILLARY
GLUCOSE-CAPILLARY: 115 mg/dL — AB (ref 65–99)
GLUCOSE-CAPILLARY: 114 mg/dL — AB (ref 65–99)
GLUCOSE-CAPILLARY: 53 mg/dL — AB (ref 65–99)
Glucose-Capillary: 127 mg/dL — ABNORMAL HIGH (ref 65–99)
Glucose-Capillary: 139 mg/dL — ABNORMAL HIGH (ref 65–99)
Glucose-Capillary: 61 mg/dL — ABNORMAL LOW (ref 65–99)
Glucose-Capillary: 75 mg/dL (ref 65–99)

## 2015-01-13 LAB — MAGNESIUM: Magnesium: 1.8 mg/dL (ref 1.7–2.4)

## 2015-01-13 LAB — PREPARE RBC (CROSSMATCH)

## 2015-01-13 LAB — COMPREHENSIVE METABOLIC PANEL
ALBUMIN: 1.3 g/dL — AB (ref 3.5–5.0)
ALT: 178 U/L — AB (ref 17–63)
AST: 185 U/L — AB (ref 15–41)
Alkaline Phosphatase: 741 U/L — ABNORMAL HIGH (ref 38–126)
BILIRUBIN TOTAL: 0.9 mg/dL (ref 0.3–1.2)
BUN: 51 mg/dL — AB (ref 6–20)
CALCIUM: 6.4 mg/dL — AB (ref 8.9–10.3)
CO2: 22 mmol/L (ref 22–32)
CREATININE: 1.58 mg/dL — AB (ref 0.61–1.24)
GFR calc Af Amer: >60 mL/min (ref >60)
GFR, EST NON AFRICAN AMERICAN: 60 mL/min — AB (ref >60)
Glucose, Bld: 150 mg/dL — ABNORMAL HIGH (ref 65–99)
Potassium: 3.8 mmol/L (ref 3.5–5.1)
Sodium: 162 mmol/L (ref 135–145)
Total Protein: 3.4 g/dL — ABNORMAL LOW (ref 6.5–8.1)

## 2015-01-13 LAB — CREATININE, SERUM
CREATININE: 1.66 mg/dL — AB (ref 0.61–1.24)
GFR, EST NON AFRICAN AMERICAN: 56 mL/min — AB (ref >60)

## 2015-01-13 LAB — URINE CULTURE: Culture: 4000

## 2015-01-13 LAB — OCCULT BLOOD X 1 CARD TO LAB, STOOL: Fecal Occult Bld: POSITIVE — AB

## 2015-01-13 LAB — VANCOMYCIN, RANDOM: Vancomycin Rm: <4 ug/mL

## 2015-01-13 MED ORDER — CETYLPYRIDINIUM CHLORIDE 0.05 % MT LIQD
7.0000 mL | Freq: Two times a day (BID) | OROMUCOSAL | Status: DC
  Administered 2015-01-13 – 2015-01-21 (×11): 7 mL via OROMUCOSAL

## 2015-01-13 MED ORDER — SODIUM CHLORIDE 0.9 % IV SOLN
Freq: Once | INTRAVENOUS | Status: AC
  Administered 2015-01-13: 15:00:00 via INTRAVENOUS

## 2015-01-13 MED ORDER — DEXTROSE 5 % IV SOLN: 2.0000 g | INTRAVENOUS | Status: DC

## 2015-01-13 MED ORDER — IPRATROPIUM-ALBUTEROL 0.5-2.5 (3) MG/3ML IN SOLN
3.0000 mL | Freq: Four times a day (QID) | RESPIRATORY_TRACT | Status: DC
  Administered 2015-01-13: 3 mL via RESPIRATORY_TRACT
  Filled 2015-01-13 (×2): qty 3

## 2015-01-13 MED ORDER — CHLORHEXIDINE GLUCONATE 0.12 % MT SOLN
15.0000 mL | Freq: Two times a day (BID) | OROMUCOSAL | Status: DC
  Administered 2015-01-13 – 2015-01-24 (×9): 15 mL via OROMUCOSAL
  Filled 2015-01-13 (×8): qty 15

## 2015-01-13 MED ORDER — METRONIDAZOLE IN NACL 5-0.79 MG/ML-% IV SOLN
500.0000 mg | Freq: Three times a day (TID) | INTRAVENOUS | Status: DC
  Administered 2015-01-14 – 2015-01-21 (×23): 500 mg via INTRAVENOUS
  Filled 2015-01-13 (×23): qty 100

## 2015-01-13 MED ORDER — ACETAMINOPHEN 650 MG RE SUPP: 650.0000 mg | Freq: Four times a day (QID) | RECTAL | Status: DC | PRN

## 2015-01-13 MED ORDER — SODIUM CHLORIDE 0.9 % IV SOLN
300.0000 mg | Freq: Two times a day (BID) | INTRAVENOUS | Status: DC
  Filled 2015-01-13 (×2): qty 300

## 2015-01-13 MED ORDER — PANTOPRAZOLE SODIUM 40 MG PO PACK
40.0000 mg | PACK | Freq: Two times a day (BID) | ORAL | Status: DC
  Administered 2015-01-13 – 2015-01-24 (×19): 40 mg
  Filled 2015-01-13 (×23): qty 20

## 2015-01-13 MED ORDER — LEVALBUTEROL HCL 0.63 MG/3ML IN NEBU
0.6300 mg | INHALATION_SOLUTION | Freq: Four times a day (QID) | RESPIRATORY_TRACT | Status: DC | PRN
  Administered 2015-01-17 – 2015-01-23 (×5): 0.63 mg via RESPIRATORY_TRACT
  Filled 2015-01-13 (×7): qty 3

## 2015-01-13 MED ORDER — IBUPROFEN 100 MG/5ML PO SUSP
600.0000 mg | Freq: Four times a day (QID) | ORAL | Status: DC | PRN
  Administered 2015-01-13: 600 mg via ORAL
  Filled 2015-01-13 (×3): qty 30

## 2015-01-13 MED ORDER — DEXTROSE 50 % IV SOLN
1.0000 | INTRAVENOUS | Status: DC | PRN
  Administered 2015-01-13: 50 mL via INTRAVENOUS
  Administered 2015-01-14 (×2): 25 mL via INTRAVENOUS
  Administered 2015-01-15: 50 mL via INTRAVENOUS
  Filled 2015-01-13 (×3): qty 50

## 2015-01-13 MED ORDER — SODIUM CHLORIDE 0.9 % IV SOLN
2.0000 g | Freq: Once | INTRAVENOUS | Status: AC
  Administered 2015-01-13: 2 g via INTRAVENOUS
  Filled 2015-01-13: qty 20

## 2015-01-13 MED ORDER — SODIUM CHLORIDE 0.9 % IV BOLUS (SEPSIS)
1000.0000 mL | Freq: Once | INTRAVENOUS | Status: AC
  Administered 2015-01-13: 1000 mL via INTRAVENOUS

## 2015-01-13 MED ORDER — LEVALBUTEROL HCL 0.63 MG/3ML IN NEBU
0.6300 mg | INHALATION_SOLUTION | Freq: Four times a day (QID) | RESPIRATORY_TRACT | Status: DC
  Administered 2015-01-13 – 2015-01-24 (×41): 0.63 mg via RESPIRATORY_TRACT
  Filled 2015-01-13 (×40): qty 3

## 2015-01-13 MED ORDER — CEFTAROLINE FOSAMIL 400 MG IV SOLR
300.0000 mg | Freq: Two times a day (BID) | INTRAVENOUS | Status: DC
  Administered 2015-01-13 – 2015-01-20 (×14): 300 mg via INTRAVENOUS
  Filled 2015-01-13 (×17): qty 300

## 2015-01-13 MED ORDER — ALBUTEROL SULFATE (2.5 MG/3ML) 0.083% IN NEBU: 2.5000 mg | INHALATION_SOLUTION | RESPIRATORY_TRACT | Status: DC | PRN

## 2015-01-13 MED ORDER — ACETAMINOPHEN 160 MG/5ML PO SOLN
650.0000 mg | Freq: Four times a day (QID) | ORAL | Status: DC | PRN
  Administered 2015-01-13 – 2015-01-21 (×14): 650 mg via ORAL
  Filled 2015-01-13 (×14): qty 20.3

## 2015-01-13 MED ORDER — FUROSEMIDE 10 MG/ML IJ SOLN
20.0000 mg | Freq: Once | INTRAMUSCULAR | Status: AC
  Administered 2015-01-13: 20 mg via INTRAVENOUS
  Filled 2015-01-13: qty 2

## 2015-01-13 NOTE — Progress Notes (Signed)
Pt mother requesting pt to have lasix prior, in-between, and post PRBC. MD paged. Will continue to monitor.

## 2015-01-13 NOTE — Progress Notes (Signed)
[facility]                                                                              Patient Demographics  Jeffery [name], is a 24 y.o. male, DOB - [birth year], HC:[id number]  Admit date - 01/02/2015   Admitting Physician Jeffery Dada, MD  Outpatient Primary MD for the patient is [name], Jeffery Loletha Grayer, MD  LOS - 1   Chief Complaint  Patient presents with  . Altered Mental Status       Brief HPI   Jeffery [name]" Jeffery [name] is a 24 y.o. 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  The patient was recently hospitalized from 11/29-12/6 for sepsis with unclear source (suspected aspiration versus osteomyelitis) and discharged 5 days ago on daily ertapenem. At the time of discharge, his vancomycin level was 54 ug/mL and so this was held.  All recent history is collected from the patient's guardian, Jeffery [name]. She reports that he has a hospital bed and monitors at home, and that she manages his home medical and nursing care. Since discharge, he was better for about a day, but then started to develop tachycardia, low grade fevers. Friday, he was making less urine, so she performed a urinary cath, making do with a suction catheter because she didn't have a foley or in-and-out catheter. Then today, he had a fever to 103F, seemed to have increased yellowish secretions that she had to suction, and was working harder to breath, and so she brought him to the hospital.  In the ED, the patient was hypotensive, tachycardic to 140 bpm, breathing 50 times per minute, had sodium 170 mmol/L, serum creatinine 1.73 mg/dL from baseline 1.3 mg/dL at discharge, worsening AST/ALT/alkaline phosphatase, and lactate 5.21 mmol/L. Blood and urine cultures were collected and linezolid and  ceftazidime were administered per Dr. Tommy [name] of ID, as well as fluids per sepsis protocol. An ABG showed a mild respiratory acidosis and the patient was transferred to [facility].   Assessment & Plan    Primary problem Severe sepsis, unknown source: Possibly due to aspiration PNA versus central line. UA negative for UTI. Patient meets criteria given tachycardia, tachypnea, fever, and evidence of organ dysfunction. - Follow blood cultures, lactic acid improving.  - CCM, ID following, patient started on IV linezolid and Fortaz. - Continue BiPAP   Active problems Severe Hypernatremia: Severe hyponatremia with sodium of 171 at the time of admission, creatinine 1.73, Cl >130, lactic acidosis, in setting of CP/quadruplegia and copious watery diarrhea. -Continue fluid resuscitation, free water deficit 7.8L calculated on admission, sodium has improved to 162 this morning, recheck BMET stat and then q4hrs  - For now continue D5 water at 175 cc/hr, discontinue the separate normal saline -Seizure precautions  Acute kidney injury In setting of sepsis and hypovolemia. -Fluid resuscitation as above  Transaminitis:  Acute on chronic, in the setting of sepsis, family have declined workup, and worsening is expected given severe sepsis. -Trend CMP   Macrocytic anemia - Hemoglobin of  8.0 this morning, mother requesting patient to be transfused, although there is a hemodilution component - I called Dr. Marin [name], per patient's mother's request, will follow recommendations - Follow stool occult test   Diarrhea - Continue rectal tube, follow stool studies, C. difficile negative  Epilepsy:  -Continue clobazam and gabapentin  Protein calorie malnutrition Nutrition consult once stable   Code Status: DO NOT RESUSCITATE  Family Communication: Discussed in detail with the patient, all imaging results, lab results explained to the patient's mother at the bedside   Disposition Plan:   Time  Spent in minutes   25 minutes  Procedures  Chest x-ray  Consults   CCM ID Hematology oncology  DVT Prophylaxis SCD  Medications  Scheduled Meds: . sodium chloride   Intravenous Once  . antiseptic oral rinse  7 mL Mouth Rinse q12n4p  . cefTAZidime (FORTAZ)  IV  2 g Intravenous Q12H  . chlorhexidine  15 mL Mouth Rinse BID  . cloBAZam  3.75 mg Per Tube TID AC  . furosemide  20 mg Intravenous Once  . gabapentin  900 mg Per Tube 3 times per day  . ipratropium-albuterol  3 mL Nebulization Q6H  . linezolid (ZYVOX) IV  600 mg Intravenous Q12H  . metoCLOPramide  10 mg Per Tube TID AC & HS  . pantoprazole sodium  40 mg Per Tube BID  . sodium chloride  3 mL Intravenous Q12H  . sucralfate  1 g Per Tube 4 times per day   Continuous Infusions: . baclofen    . dextrose 175 mL/hr at 01/13/15 0501   PRN Meds:.acetaminophen (TYLENOL) oral liquid 160 mg/5 mL **OR** acetaminophen, albuterol, dicyclomine, glycopyrrolate, ketoconazole, zinc oxide   Antibiotics   Anti-infectives    Start     Dose/Rate Route Frequency Ordered Stop   01/13/15 0845  linezolid (ZYVOX) IVPB 600 mg  Status:  Discontinued     600 mg 300 mL/hr over 60 Minutes Intravenous Every 12 hours 01/18/2015 2345 01/05/2015 2350   01/11/2015 2200  linezolid (ZYVOX) IVPB 600 mg     600 mg 300 mL/hr over 60 Minutes Intravenous Every 12 hours 01/16/2015 2020     01/18/2015 2200  cefTAZidime (FORTAZ) 2 g in dextrose 5 % 50 mL IVPB     2 g 100 mL/hr over 30 Minutes Intravenous Every 12 hours 01/19/2015 2034          Subjective:   Jeffery [name] was seen and examined today. Cerebral palsy, on BiPAP, unable to provide any review of systems. Mother at the bedside. Fever of 102.7. Tachycardiac.   Objective:   Blood pressure 102/65, pulse 152, temperature 102.7 F (39.3 C), temperature source Rectal, resp. rate 56, height 4\' 11"  (1.499 m), weight 58.8 kg (129 lb 10.1 oz), SpO2 94 %.  Wt Readings from Last 3 Encounters:  01/20/2015 58.8 kg  (129 lb 10.1 oz)  01/06/15 60.147 kg (132 lb 9.6 oz)  10/19/14 64.5 kg (142 lb 3.2 oz)     Intake/Output Summary (Last 24 hours) at 01/13/15 1137 Last data filed at 01/13/15 0000  Gross per 24 hour  Intake 2736.67 ml  Output      0 ml  Net 2736.67 ml    Exam  General: On BiPAP  HEENT:  PERRLA, EOMI, Anicteric Sclera, mucous membranes moist.   Neck: Supple, no JVD, no masses  CVS: S1 S2 clear, tachycardia  Respiratory: Decreased breath sounds with scattered rhonchi  Abdomen: Soft, G-tube  Ext: contractures in the upper and  lower extremities  Neuro: unable to assess, muscle atrophic  Skin: Sacral decub ulcer   Psych: on BiPAP, spontaneously opens eyes but otherwise not responding to any commands   Data Review   Micro Results Recent Results (from the past 240 hour(s))  C difficile quick scan w PCR reflex     Status: None   Collection Time: 01/04/15  4:01 PM  Result Value Ref Range Status   C Diff antigen NEGATIVE NEGATIVE Final   C Diff toxin NEGATIVE NEGATIVE Final   C Diff interpretation Negative for toxigenic C. difficile  Final  Urine culture     Status: None (Preliminary result)   Collection Time: 02/01/2015  7:44 PM  Result Value Ref Range Status   Specimen Description URINE, RANDOM  Final   Special Requests PEDS BAG  Final   Culture NO GROWTH < 24 HOURS  Final   Report Status PENDING  Incomplete  C difficile quick scan w PCR reflex     Status: None   Collection Time: 01/13/15 12:45 AM  Result Value Ref Range Status   C Diff antigen NEGATIVE NEGATIVE Final   C Diff toxin NEGATIVE NEGATIVE Final   C Diff interpretation Negative for toxigenic C. difficile  Final    Radiology Reports Ct Pelvis Wo Contrast  01/01/2015  CLINICAL DATA:  Cerebral palsy.  Clinical concern for osteomyelitis. EXAM: CT PELVIS WITHOUT CONTRAST TECHNIQUE: Multidetector CT imaging of the pelvis was performed following the standard protocol without intravenous contrast. COMPARISON:   10/15/2014 CT abdomen/pelvis. FINDINGS: Reproductive: Normal prostate and seminal vesicles. Bladder: Collapsed and grossly normal urinary bladder. Visualized ureters are normal caliber. Bowel: Visualized small and large bowel are normal caliber with no bowel wall thickening. Oral contrast traverses to the distal rectum. Partially visualized is an enteric tube in the distal duodenum and proximal jejunum, with the tip not seen on this study. Vascular/Lymphatic: No pathologically enlarged lymph nodes in the pelvis. Other: No pneumoperitoneum, ascites or focal fluid collection. Musculoskeletal: There is curvilinear gas, fat stranding and ill-defined fluid in the posterior medial left gluteal subcutaneous soft tissues (series 4/ image 125), suggestive of an incompletely visualized decubitus ulcer, with extension of the fat stranding and fluid to the inferior margin of the left ischium. There is mild focal cortical erosion at the inferior left ischium with associated periosteal reaction, in keeping with osteomyelitis. No presacral fluid or fat stranding. No evidence of a sacral decubitus ulcer. Subcutaneous ventral right lower quadrant spinal stimulator device is noted with lead coursing posteriorly and entering the lumbar spinal canal, with the tip not visualized on this study. No aggressive appearing focal osseous lesions. 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 There is stable diffuse osteopenia. No osseous fracture. No additional focal cortical erosions or periosteal reaction. Partially visualized is bilateral posterior spinal fusion hardware with posterior bilateral fusion rods and interlocking screws extending into the bilateral iliac wings, with no evidence of hardware fracture or loosening. IMPRESSION: 1. Osteomyelitis of the inferior left ischium  associated with curvilinear fat stranding, fluid and gas in the posteromedial left gluteal subcutaneous soft tissues, presumably due to a medial left gluteal decubitus ulcer (which is incompletely visualized on this study). No focal drainable fluid collection. 2. No pelvic ascites or intraperitoneal fluid collection. No presacral fluid or inflammatory change. 3. 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 Electronically Signed   By: Rinaldo Ratel  Poff M.D.   On: 01/01/2015 10:34   Ir Replc Duoden/jejuno Tube Percut W/fluoro  01/01/2015  CLINICAL DATA:  Routine jejunostomy tube exchange EXAM: IR REPLACE DUODEN/JEJUNO TUBE PERCUT WITH FLOURO FLUOROSCOPY TIME:  12 seconds MEDICATIONS AND MEDICAL HISTORY: None ANESTHESIA/SEDATION: None CONTRAST:  5 cc Omnipaque 300 PROCEDURE: The procedure, risks, benefits, and alternatives were explained to the patient. Questions regarding the procedure were encouraged and answered. The patient understands and consents to the procedure. The abdomen was prepped with Betadine in a sterile fashion, and a sterile drape was applied covering the operative field. A sterile gown and sterile gloves were used for the procedure. The existing gastrojejunostomy tube was removed over a stiff glidewire and exchange for a new make jejunostomy tube. The tip was positioned in the proximal jejunum. Contrast was injected. 10 cc saline was utilized to insufflate the balloon. FINDINGS: The jejunostomy tube is been exchange. Tip is in the proximal jejunum. COMPLICATIONS: None IMPRESSION: Successful jejunostomy tube exchange. Electronically Signed   By: Marybelle Killings M.D.   On: 01/01/2015 12:18   Dg Chest Portable 1 View  01/14/2015  CLINICAL DATA:  Fever. EXAM: PORTABLE CHEST 1 VIEW COMPARISON:  December 3rd 2016. FINDINGS: Stable chronic low lung volumes are noted. Stable surgical fusion of thoraco lumbar spine is noted. No pneumothorax is noted. Increased bibasilar linear  densities are noted most consistent with worsening subsegmental atelectasis or possibly pneumonia. Cardiomediastinal silhouette is unchanged. IMPRESSION: Stable chronic low lung volumes are noted. Increased bibasilar linear densities are noted most consistent with worsening subsegmental atelectasis or possibly pneumonia. Electronically Signed   By: Marijo Conception, M.D.   On: 01/25/2015 20:01   Dg Chest Port 1 View  01/04/2015  CLINICAL DATA:  Shortness of breath and fever.  Recent pneumonia. EXAM: PORTABLE CHEST 1 VIEW COMPARISON:  01/01/2015 and 12/31/2014. FINDINGS: 0814 hours. There are chronic low lung volumes. The patient's mandible overlies the right lung apex. Chronic bibasilar atelectasis or scarring is unchanged. There is no airspace disease, edema or pleural effusion. Right subclavian Port-A-Cath appears unchanged. The bones appear unchanged status post thoracolumbar fusion. IMPRESSION: No significant change in chronic bibasilar atelectasis or scarring. No acute findings demonstrated. Electronically Signed   By: Richardean Sale M.D.   On: 01/04/2015 10:29   Portable Chest 1 View  01/01/2015  CLINICAL DATA:  Shortness of breath, cough, dyspnea.  Cerebral palsy EXAM: PORTABLE CHEST 1 VIEW COMPARISON:  Yesterday FINDINGS: Porta catheter on the right with tip in good position at the SVC level. Persistent hypoventilation and linear opacities at the bases. There is no edema, consolidation, effusion, or pneumothorax. Normal heart size and mediastinal contours. Extensive spinal fixation. IMPRESSION: Chronic hypoventilation with basilar atelectasis or scarring. Electronically Signed   By: Monte Fantasia M.D.   On: 01/01/2015 07:40   Dg Chest Port 1 View  12/31/2014  CLINICAL DATA:  Chest pain recent pneumonia persistent fever for 1 week EXAM: PORTABLE CHEST 1 VIEW COMPARISON:  10/19/2014 FINDINGS: Spinal rods noted. Port-A-Cath stable on the right. Low lung volumes with bibasilar atelectasis. Heart  size normal. IMPRESSION: Mild bilateral lower lobe atelectasis. No definite evidence of pneumonia. Subtle infiltrate could be due obscured by the bilateral lower lobe atelectasis however. Electronically Signed   By: Skipper Cliche M.D.   On: 12/31/2014 17:39   US Abdomen Limited Ruq  01/05/2015  CLINICAL DATA:  Elevated liver function tests. Cerebral palsy. Feeding tube and implanted pain pump in the right upper quadrant of the abdomen overlying  the gallbladder area. EXAM: US ABDOMEN LIMITED - RIGHT UPPER QUADRANT COMPARISON:  Limited abdomen ultrasound dated 11/06/2014. Abdomen CT dated 10/15/2014. FINDINGS: Gallbladder: Multiple gallstones in the gallbladder measuring up to 7 mm in maximum diameter each. No gallbladder wall thickening or pericholecystic fluid. No apparent pain over the gallbladder. Common bile duct: Diameter: 3.8 mm Liver: Normal echogenicity.  No mass or biliary ductal dilatation seen. IMPRESSION: 1. Cholelithiasis without evidence cholecystitis. 2. Otherwise, unremarkable examination. Electronically Signed   By: Claudie Revering M.D.   On: 01/05/2015 15:59    CBC  Recent Labs Lab 01/07/15 0450 01/10/2015 1921 01/26/2015 1935 01/07/2015 2057 01/13/15 0150 01/13/15 0400  WBC 4.2 5.9  --   --  6.0 6.3  HGB 10.1* 11.7* 12.6* 10.5* 9.1* 8.0*  HCT 34.1* 41.5 37.0* 31.0* 33.5* 29.7*  PLT 123* 219  --   --  111* 98*  MCV 106.2* 115.6*  --   --  117.1* 117.4*  MCH 31.5 32.6  --   --  31.8 31.6  MCHC 29.6* 28.2*  --   --  27.2* 26.9*  RDW 20.1* 21.4*  --   --  20.0* 20.0*  LYMPHSABS  --  0.5*  --   --   --   --   MONOABS  --  0.4  --   --   --   --   EOSABS  --  0.1  --   --   --   --   BASOSABS  --  0.1  --   --   --   --     Chemistries   Recent Labs Lab 01/07/15 0450 01/29/2015 1921 01/22/2015 1935 01/26/2015 2057 01/13/15 0150 01/13/15 0400  NA 143 170* 171* 172*  --  162*  K 4.1 5.2* 5.0 4.5  --  3.8  CL 106 >130* 129* 129*  --  >130*  CO2 23 26  --   --   --  22  GLUCOSE  71 115* 111* 125*  --  150*  BUN 29* 59* 60* 54*  --  51*  CREATININE 1.36* 1.73* 1.70* 1.70* 1.66* 1.58*  CALCIUM 8.5* 8.4*  --   --   --  6.4*  MG  --   --   --   --  1.8  --   AST  --  979*  --   --   --  185*  ALT  --  414*  --   --   --  178*  ALKPHOS  --  1198*  --   --   --  741*  BILITOT  --  0.6  --   --   --  0.9   ------------------------------------------------------------------------------------------------------------------ estimated creatinine clearance is 53.1 mL/min (by C-G formula based on Cr of 1.58). ------------------------------------------------------------------------------------------------------------------ No results for input(s): HGBA1C in the last 72 hours. ------------------------------------------------------------------------------------------------------------------ No results for input(s): CHOL, HDL, LDLCALC, TRIG, CHOLHDL, LDLDIRECT in the last 72 hours. ------------------------------------------------------------------------------------------------------------------ No results for input(s): TSH, T4TOTAL, T3FREE, THYROIDAB in the last 72 hours.  Invalid input(s): FREET3 ------------------------------------------------------------------------------------------------------------------ No results for input(s): VITAMINB12, FOLATE, FERRITIN, TIBC, IRON, RETICCTPCT in the last 72 hours.  Coagulation profile No results for input(s): INR, PROTIME in the last 168 hours.  No results for input(s): DDIMER in the last 72 hours.  Cardiac Enzymes No results for input(s): CKMB, TROPONINI, MYOGLOBIN in the last 168 hours.  Invalid input(s): CK ------------------------------------------------------------------------------------------------------------------ Invalid input(s): POCBNP   Recent Labs  01/13/15 0336 01/13/15 0827  GLUCAP 139*  [place] M.D. [facility] 01/13/2015, 11:37 AM  Pager: [phone] Between 7am to 7pm - call Pager -  [phone]  After 7pm go to [email or web address] - password [email or web address]  Call night coverage person covering after 7pm

## 2015-01-13 NOTE — Progress Notes (Signed)
UR COMPLETED  

## 2015-01-13 NOTE — Procedures (Signed)
Pt taken off [place] and placed on BiPAP.  Pt is tolerating well and comfortable.

## 2015-01-13 NOTE — Progress Notes (Signed)
CRITICAL VALUE ALERT  Critical value received:  Lactic acid 2.6, sodium 162, chloride >130, calcium 6.4.  Date of notification:  01/13/2015  Time of notification:  0500  Critical value read back:Yes.    Nurse who received alert:  Lake Bells, RN  MD notified (1st page):  C. Danford, MD  Time of first page:  0510  MD notified (2nd page):  Time of second page:  Responding MD:  C. Danford, MD  Time MD responded:  0522, orders received for one time dose of 2g calcium gluconate. Will continue to monitor.  Sherlie Ban, RN

## 2015-01-13 NOTE — Progress Notes (Signed)
Spoke to Dr. Antonieta Pert nurse Roselyn Reef to verify order, per MD Ennever okay to give 2 units of PRBC and lasix with temp of 103 and minimal urine output.

## 2015-01-13 NOTE — Progress Notes (Signed)
CRITICAL VALUE ALERT  Critical value received:  Lactic Acid 3.0  Date of notification:  01/13/2015  Time of notification:  [id number]  Critical value read back:Yes.    Nurse who received alert:  Lake Bells, RN  MD notified (1st page):  C. Danford, MD  Time of first page:  0251  MD notified (2nd page):  Time of second page:  Responding MD:  C. Danford, MD  Time MD responded:  [id number], orders received for 1 L bolus. Will administer and continue to monitor.  Sherlie Ban, RN

## 2015-01-13 NOTE — Progress Notes (Signed)
   01/13/15 1300  Clinical Encounter Type  Visited With Patient and family together  Visit Type Spiritual support  Referral From Patient;Family  Spiritual Encounters  Spiritual Needs Prayer;Emotional  Stress Factors  Patient Stress Factors Health changes  Family Stress Factors Loss  Chaplain responded to request from patient to see his unit chaplain, who was off duty. Patient may not survive the day. Chaplain prayed with him and family, visited with father in cafeteria.

## 2015-01-13 NOTE — Progress Notes (Signed)
   01/13/15 1400  Clinical Encounter Type  Visited With Patient and family together  Visit Type Follow-up  Spiritual Encounters  Spiritual Needs Emotional  Stress Factors  Patient Stress Factors Health changes  Family Stress Factors Loss  Patient is DNR, and parents have not slept in significant period of time. Checked in for second time today to see if there had been improvement.

## 2015-01-13 NOTE — Consult Note (Addendum)
WOC wound consult note Pt is familiar to [place] from recent admission; refer to consult note on 11/30. Pt is critically ill at this time and was not turned to assess wounds. Discussed plan of care with mother at the bedside and she showed photos of the wounds on her phone and stated the previous stage 2 wound to right buttocks was almost healed prior to admission and was pink and dry.   Reason for Consult: Consult requested for bilat buttocks wounds. Mother is a Marine scientist and also the primary caregiver when at home and is very well-informed regarding topical treatment. Left buttock wound is currently a healing stage 4 pressure injury, according to her it has greatly improved and was previously unstageable. Mother showed photo on her phone which displayed that the wound was pink and moist,  She denies any odor, states there is mod amt yellow drainage, and gave measurements obtained this week of 3.8X3.6X3.8cm. Pressure Ulcer POA: Yes Dressing procedure/placement/frequency: Mother has been using Medihoney and foam dressings to promote healing and states this has been very successful. She uses balmex, and zinc to protect skin from frequent diarrhea when it occurs. Continue present plan of care with bedside nurse, Mom or Dad  performing dressing changes to wounds Q day and PRN soiling with supplies from home as desired.  They would like Medihoney applied to buttock wound, then pack loosley with gauze, then cover with foam dressing.  Shallow wound to buttock uses foam dressing covered with tegaderm and change Q 3 days or PRN soiling. Medihoney is not available in the [place] but mother has her own supplies at the bedside which can be used. She denies further questions or need for assistance. Please re-consult if further assistance is needed. Thank-you,  Julien Girt MSN, [place], [place], [place], [place]

## 2015-01-13 NOTE — Progress Notes (Signed)
Palliative care consult was received.  I stopped to meet with Mr. Heward family.   [name] (patient's mother/guardian) reports meeting with Dr. Deitra Mayo (who was at that time a provider with our team) during a prior admission earlier this year.  She reports that she is familiar with services of palliative care, reported, "We don't need your services," and declined to meet with our team at that this time.   I will try to follow-up again tomorrow with his family if they are agreeable.   Micheline Rough, MD [facility] Team [phone]

## 2015-01-13 NOTE — Telephone Encounter (Signed)
I spoke with mother for 5 minutes, he has markedly improved in his oxygenation, vital signs, defervesced, and is receiving a blood transfusion as we speak.  He is been seen by ID.  No further suggestions.

## 2015-01-13 NOTE — Telephone Encounter (Signed)
[name] called and states that Jeffery [name] is on [facility] in bed 13. They had to take them in last night and things are not doing well. [name] was very upset as she left the voicemail and states that "she can't do this" and needs to talk to Dr. Gaynell Face.   [phone]

## 2015-01-13 NOTE — Telephone Encounter (Signed)
I left a message for mother to call. 

## 2015-01-13 NOTE — Progress Notes (Signed)
Initial Nutrition Assessment  DOCUMENTATION CODES:   Not applicable  INTERVENTION:    When able, resume home TF regimen: 3 cans TwoCal HN (parents bring from home) via J-tube per day. Gatorade flushes (parents bring from home) 180 ml 8 times per day (before and after each can of TF is given plus 2 additional times per day). Prostat 30 ml TID via J-tube. TF regimen will provide 1725 kcals, 105 gm protein per day.  Pharmacy to assist with entering TF orders (RD unable to order TwoCal HN as it is not on the hospital formulary-parents bring formula from home).  NUTRITION DIAGNOSIS:   Inadequate oral intake related to chronic illness, inability to eat as evidenced by NPO status.  GOAL:   Patient will meet greater than or equal to 90% of their needs  MONITOR:   Labs, Weight trends, TF tolerance, Skin  REASON FOR ASSESSMENT:   Consult Enteral/tube feeding initiation and management  ASSESSMENT:   24 y.o. 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 Admitted for severe sepsis and hypernatremia.Plans for fluid resuscitation and broad spectrum antibitoics.   Unable to speak with parents at this time. Patient familiar from multiple previous admissions. Current TF regimen is 3 cans of TwoCal HN per day via J-tube with Prostat 40 ml BID. Rate of TF varies depending on whether he is in the bed or sitting up. She uses Gatorade to flush the tube instead of water, 180 ml 8 times per day. GJ tube was exchanged during last hospitalization on 11/30. Received MD Consult for TF initiation and management. Per discussion with RN, patient's mom is not ready to resume TF at this time with ongoing fever.   Diet Order:   NPO  Skin:  Wound (see comment) (stage IV to buttocks, stage  II to buttocks)  Last BM:  12/11  Height:   Ht Readings from Last 1 Encounters:  01/16/2015 4\' 11"  (1.499 m)    Weight:   Wt Readings from Last 1 Encounters:  01/06/2015 129 lb 10.1 oz (58.8 kg)    Ideal Body Weight:     BMI:  Body mass index is 26.17 kg/(m^2).  Estimated Nutritional Needs:   Kcal:  1400-1600  Protein:  75-95 gm  Fluid:  >/= 1.5 L  EDUCATION NEEDS:   No education needs identified at this time  Molli Barrows, [place], [place], [place] Pager [phone] After Hours Pager [phone]

## 2015-01-13 NOTE — Progress Notes (Signed)
Pharmacy Antibiotic Follow-up Note  Jeffery [name] is a 24 y.o. year-old male admitted on 01/05/2015.  The patient is currently on Ceftazidime + Zyvox for continued osteo treatment and new respiratory coverage.   Assessment/Plan: 5 YOM well known to pharmacy from previous admissions who was on treatment with Ertapenem PTA with a planned stop date of 02/11/15. The patient had also previously been on Vancomycin however this was stopped due to AKI and a high VR of 54 mcg/ml last admission.   This admission, the patient presented with respiratory distress and acute kidney injury (baseline SCr 0.5-0.7, admit 1.7), SCr 1.58, CrCl estimated to be <20 ml/min. No UOP charted but per discussion with RN is <100 cc today. Will need to adjust Ceftazidime to q24h. Continue Zyvox per ID for now but may need to consider alternative given the patient's history of chronic neutropenia.  Plan 1. Adjust Ceftazidime to 2g IV every 24 hours 2. Continue Zyvox 600 mg IV every 12 hours per ID recommendations 3. Will continue to follow renal function, culture results, LOT, and antibiotic de-escalation plans   Temp (24hrs), Avg:102.6 F (39.2 C), Min:100.4 F (38 C), Max:103.9 F (39.9 C)   Recent Labs Lab 01/07/15 0450 01/20/2015 1921 01/13/15 0150 01/13/15 0400  WBC 4.2 5.9 6.0 6.3    Recent Labs Lab 01/03/2015 1921 01/21/2015 1935 01/17/2015 2057 01/13/15 0150 01/13/15 0400  CREATININE 1.73* 1.70* 1.70* 1.66* 1.58*   Estimated Creatinine Clearance: 53.1 mL/min (by C-G formula based on Cr of 1.58).    Allergies  Allergen Reactions  . Depakote [Divalproex Sodium] Other (See Comments)    Causes pancreatitis   . Keppra [Levetiracetam] Other (See Comments)    Bone marrow suppression  . Vimpat [Lacosamide] Rash  . Adhesive [Tape] Other (See Comments)    Rips skin off (paper tape is ok)  . Neulasta [Pegfilgrastim] Other (See Comments)    Fever, tachycardia    Antimicrobials this admission: Erta PTA >>  12/11 Ceftaz 12/11 >> Zyvox 12/11 >>  Levels/dose changes this admission: n/a  Microbiology results: 12/11 [facility] >> 12/11 UCx >> 12/12 CDiff >> neg  Thank you for allowing pharmacy to be a part of this patient's care.  Alycia Rossetti, PharmD, BCPS Clinical Pharmacist Pager: [phone] 01/13/2015 2:02 PM

## 2015-01-13 NOTE — Progress Notes (Signed)
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 He presents to the ED from home with a decreased level of consciousness and has been progressively less alert throughout the day. It was noted on admission that his RR was 40. Patient has been recently treated for fevers and sepsis 2/2 osteomyelitis. He on on invanz at home via line. History was obtained from father who is at bedside. He also states that the patients mother suctioned out yellow material from the patients trachea earlier today. Mother is currently at [facility], too nervous from his illness to drive to [facility] safely. His father states that he hopes the patient goes to [facility] as he states that the family is very satisfied with his care there.   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  has past surgical history that includes Mole removal ("several B/T 2008-2010");  Jejunostomy feeding tube (03/08/2013; ~ 09/2013; ~ 01/2014); Gastrostomy tube placement (11./02/1998); Button change (12/15/2010); PEG placement (10/07/2011); Inguinal hernia repair (Bilateral, 1992); Retinopathy of prematurity surgery (1992); Achilles tendon lengthening (Bilateral, 12/1998); Tendon release (12/1998); Infusion pump implantation (07/25/2000; 2013); PEG placement (N/A, 06/05/2012); Strabismus surgery (Bilateral, 1993); Back surgery (~ 2008); Tendon repair (Bilateral, 09/05/2012); PEG placement (N/A, 09/13/2012); Flexible sigmoidoscopy (N/A, 10/30/2012); PEG placement (N/A, 11/15/2012); Gastrostomy tube placement (11./02/1998); Rectal biopsy (10/29/2012); Portacath placement (Right, 11/25/2012); and Eye surgery. Prior to Admission medications   Medication Sig Start Date End Date Taking? Authorizing Provider  acetaminophen (TYLENOL) 160 MG/5ML solution Place 500 mg into feeding tube every 6 (six) hours as needed for moderate pain or fever.    Yes Historical Provider, MD  acetaminophen (TYLENOL) 325 MG suppository Place 650 mg rectally every 6 (six) hours as needed for fever.   Yes Historical Provider, MD  albuterol (PROVENTIL HFA;VENTOLIN HFA) 108 (90 BASE) MCG/ACT inhaler Inhale 2 puffs into the lungs every 4 (four) hours as needed for shortness of breath.   Yes Historical Provider, MD  albuterol (PROVENTIL) (2.5 MG/3ML) 0.083% nebulizer solution Take 2.5 mg by nebulization See admin instructions. Give 1 vial (2.5 mg) twice daily (8am and 8pm) and every 4 hours as needed for shortness of breath or wheezing 10/11/12  Yes Elsie Stain, MD  baclofen (GABLOFEN) 40000 MCG/20ML SOLN by Intrathecal route continuous. 385.2 mcg in 24 hours 05/04/12  Yes Jodi Geralds, MD  bag balm OINT ointment Apply 1 application topically See admin instructions. Apply to sacral area with each diaper change   Yes Historical Provider, MD  calcium carbonate, dosed in mg elemental calcium, 1250 MG/5ML Place 1,250 mg into feeding  tube 3 (three) times daily. 8am , 2pm, and 8pm  Yes Historical Provider, MD  Cholecalciferol (VITAMIN D3) 400 UNIT/ML LIQD Give 2.5 mLs by tube daily. 1000mg  daily 10/02/14  Yes Historical Provider, MD  DIASTAT ACUDIAL 20 MG GEL Place 12.5 mg rectally as needed (for seizure lasting 2 minutes or longer or repetitive seizures - no more than 1 dose in 12 hours (not given with nasal versed)).  04/01/14  Yes Historical Provider, MD  dicyclomine (BENTYL) 10 MG/5ML syrup Place 10 mg into feeding tube every 8 (eight) hours as needed (abdominal cramps). Do not exceed 5 days in one week   Yes Historical Provider, MD  diphenhydrAMINE (BENADRYL) 12.5 MG/5ML liquid Place 25 mg into feeding tube 4 (four) times daily as needed (30 min before Neupogen).    Yes Historical Provider, MD  diphenoxylate-atropine (LOMOTIL) 2.5-0.025 MG/5ML liquid 10 mLs by Gastric Tube route every 6 (six) hours as needed for diarrhea or loose stools.    Yes Historical Provider, MD  filgrastim (NEUPOGEN) 300 MCG/ML injection Inject 1 mL (300 mcg total) into the skin once a week. Patient taking differently: Inject 300 mcg into the skin as needed (neutropenia).  12/04/14  Yes Eliezer Bottom, NP  folic acid (FOLVITE) 1 MG tablet Give 1 mg by tube every morning. 8:00am per G tube   Yes Historical Provider, MD  furosemide (LASIX) 10 MG/ML solution 20 mg by Per J Tube route daily as needed for fluid or edema.    Yes Historical Provider, MD  gabapentin (NEURONTIN) 250 MG/5ML solution Take 18 mL 3 times daily Patient taking differently: Place 900 mg into feeding tube 3 (three) times daily. Take 18 mL 3 times daily 12/09/14  Yes Rockwell Germany, NP  glycopyrrolate (ROBINUL) 1 MG tablet Place 1 tablet (1 mg total) into feeding tube 3 (three) times daily as needed (excessive secretions). 01/06/15  Yes Shanker Kristeen Mans, MD  Heparin Lock Flush (HEPARIN FLUSH, PORCINE,) 100 UNIT/ML injection 5 mLs (500 Units total) by Intracatheter route as needed (Prior  to de-accessing port). 10/25/14  Yes Volanda Napoleon, MD  ibuprofen (ADVIL,MOTRIN) 100 MG/5ML suspension 400 mg by Gastric Tube route every 6 (six) hours as needed for fever (pain).    Yes Historical Provider, MD  ketoconazole (NIZORAL) 2 % cream Apply 1 application topically 2 (two) times daily as needed for irritation.   Yes Historical Provider, MD  lidocaine (XYLOCAINE) 5 % ointment APPLY AS DIRECTED 30 minutes PRIOR TO BLOOD DRAWS 09/23/14  Yes Historical Provider, MD  metoCLOPramide (REGLAN) 5 MG/5ML solution Place 10 mLs (10 mg total) into feeding tube 4 (four) times daily -  before meals and at bedtime. 07/26/14  Yes Kelvin Cellar, MD  midazolam (VERSED) 5 MG/ML injection Place 2 mLs (10 mg total) into the nose once. Draw up 25ml in 2 syringes. Remove blue vial access device. Attach syringe to nasal atomizer for intranasal administration. Give 90ml in right nostril x 2 for seizures lasting 2 minutes or longer or for repetitive seizures in a short period of time. Patient taking differently: Place 10 mg into the nose as needed (for seizure lasting 2 minutes or longer or repetitive seizures - no more than 1 dose in 24 hours (if unable to give diastat)). Draw up 55ml in 2 syringes. Remove blue vial access device. Attach syringe to nasal atomizer for intranasal administration. Give 12ml in right nostril x 2 for seizures lasting 2 minutes or longer or for repetitive seizures in a short period of time. 11/05/13  Yes Rockwell Germany, NP  Multiple Vitamin (  MULTIVITAMIN) LIQD 10 mLs by Gastric Tube route 2 (two) times daily.    Yes Historical Provider, MD  Multiple Vitamins-Minerals (ZINC PO) 5 mLs by Gastric Tube route 2 (two) times daily. 244 mg / 5 mL zinc solution compounded by [facility]   Yes Historical Provider, MD  mupirocin ointment (BACTROBAN) 2 % Apply 1 application topically as needed (to G-T site). Patient taking differently: Apply 1 application topically 3 (three) times daily as needed (for  redness at the G-T site).  09/07/12  Yes Lafayette Dragon, MD  Nutritional Supplements (PROMOD) LIQD Give 40 mLs by tube 2 (two) times daily. 8am and 5pm   Yes Historical Provider, MD  Nutritional Supplements (TWOCAL HN) LIQD 237 mLs by Per J Tube route See admin instructions. T.3 can at 46 cc hour x 12 hours - Gatorade 172 cc pre and post each and extra 172 bid.   Yes Historical Provider, MD  omeprazole (PRILOSEC) 2 mg/mL SUSP Take 20 mg by mouth 2 (two) times daily.   Yes Historical Provider, MD  OnabotulinumtoxinA (BOTOX IJ) Inject as directed See admin instructions. Every 3 months   Yes Historical Provider, MD  ONFI 2.5 MG/ML solution Give 1.50ml via gastrostomy tube at 8 AM, 2 PM and 8 PM 11/14/14  Yes Rockwell Germany, NP  OVER THE COUNTER MEDICATION 2 capsules by Per J Tube route 3 (three) times daily.   Yes Historical Provider, MD  OXYGEN-HELIUM IN Inhale 3 L into the lungs as needed (o2 at 90%). Oxygen PRN to keep O2 Sat at 90%   Yes Historical Provider, MD  potassium chloride (KLOR-CON) 20 MEQ packet Give 20 meq daily except twice daily with lasix, given through J port Patient taking differently: Take 20 mEq by mouth daily. Give an additional dose when taking lasix 07/26/14  Yes Kelvin Cellar, MD  Pseudoephedrine HCl (SUDAFED CHILDRENS) 15 MG/5ML LIQD 30 mg by Gastric Tube route 3 (three) times daily. 8am, 2pm, 8pm   Yes Historical Provider, MD  sodium chloride 0.9 % injection Flush port after use with 10cc NS. Disp# 10 - 10cc syringes 10/25/14  Yes Volanda Napoleon, MD  sodium phosphate (FLEET) enema Place 1 enema rectally daily as needed (gas buildup/ bloating).    Yes Historical Provider, MD  sucralfate (CARAFATE) 1 GM/10ML suspension 1 g by Gastric Tube route 4 (four) times daily. 7am, 12pm, 5pm, 7pm Administer alone 30 minutes prior to other medications.   Yes Historical Provider, MD  Zinc Oxide (BALMEX EX) Apply 1 application topically See admin instructions. Apply to sacral with each diaper  change   Yes Historical Provider, MD  Amino Acids-Protein Hydrolys (FEEDING SUPPLEMENT, PRO-STAT SUGAR FREE 64,) LIQD Take 30 mLs by mouth 2 (two) times daily. Patient not taking: Reported on 01/05/2015 06/27/14   Velvet Bathe, MD   Allergies  Allergen Reactions  . Depakote [Divalproex Sodium] Other (See Comments)    Causes pancreatitis   . Keppra [Levetiracetam] Other (See Comments)    Bone marrow suppression  . Vimpat [Lacosamide] Rash  . Adhesive [Tape] Other (See Comments)    Rips skin off (paper tape is ok)  . Neulasta [Pegfilgrastim] Other (See Comments)    Fever, tachycardia    FAMILY HISTORY:  family history is not on file. He was adopted. SOCIAL HISTORY:  reports that he has never smoked. He has never used smokeless tobacco. He reports that he does not drink alcohol or use illicit drugs.  REVIEW OF SYSTEMS:   Unable to  obtain as the patient is nonverbal.  SUBJECTIVE: Unable to obtain as the patient is nonverbal.  VITAL SIGNS: Temp:  [100.4 F (38 C)-103.9 F (39.9 C)] 102.7 F (39.3 C) (12/12 0823) Pulse Rate:  [53-158] 152 (12/12 1124) Resp:  [11-56] 56 (12/12 1124) BP: (59-112)/(14-69) 102/65 mmHg (12/11 2341) SpO2:  [94 %-99 %] 94 % (12/12 1124) FiO2 (%):  [40 %-60 %] 40 % (12/11 2341) Weight:  [58.8 kg (129 lb 10.1 oz)] 58.8 kg (129 lb 10.1 oz) (12/11 2341)  PHYSICAL EXAMINATION: General: 24yo caucasian male, who has cerebral palsy with contractures. He appears to be in acute respiratory distress.  Neuro:  Patient does not adhere to neurologic testing. He opens his eyes spontaenously. PERRL. Contractures noted on upper and lower extremities.  HEENT: Head enlarged relative to the rest of body, atraumatic. Ears symmetric, permeable. Eyes: no conjunctival icterus, no erythema. Nose: permeable, midline septum, Could not assess oropharynx as the patient is on BiPAP  Cardiovascular: S1S2 tachycardic no murmurs, rubs, or gallops auscultated. No thrills palpated.    Lungs:  Chest symmetrical with respirations, use of accessory muscles, some scattered rhonchi noted bilaterally and diffusely Abdomen:  Soft, rotund. nontender, no guarding, nondistended. Present bowel sounds. Musculoskeletal:  muscle atrophy  On all extremities with contractures on upper and lower extemities, particularly distally.  Skin:  No notable scars, rashes, patient has a port on his right upper chest. Dressing appears clean. No erythema around insertion site. J-tube on abdominal wall.  He has a sacral decubitus ulcer.  Lymphatics: no palpable lymphadenopathy of cervical, supraclvicular nor inguinal areas.      Recent Labs Lab 01/07/15 0450 01/17/2015 1921 01/23/2015 1935 01/09/2015 2057 01/13/15 0150 01/13/15 0400  NA 143 170* 171* 172*  --  162*  K 4.1 5.2* 5.0 4.5  --  3.8  CL 106 >130* 129* 129*  --  >130*  CO2 23 26  --   --   --  22  BUN 29* 59* 60* 54*  --  51*  CREATININE 1.36* 1.73* 1.70* 1.70* 1.66* 1.58*  GLUCOSE 71 115* 111* 125*  --  150*    Recent Labs Lab 01/07/2015 1921  01/28/2015 2057 01/13/15 0150 01/13/15 0400  HGB 11.7*  < > 10.5* 9.1* 8.0*  HCT 41.5  < > 31.0* 33.5* 29.7*  WBC 5.9  --   --  6.0 6.3  PLT 219  --   --  111* 98*  < > = values in this interval not displayed. Dg Chest Portable 1 View  01/21/2015  CLINICAL DATA:  Fever. EXAM: PORTABLE CHEST 1 VIEW COMPARISON:  December 3rd 2016. FINDINGS: Stable chronic low lung volumes are noted. Stable surgical fusion of thoraco lumbar spine is noted. No pneumothorax is noted. Increased bibasilar linear densities are noted most consistent with worsening subsegmental atelectasis or possibly pneumonia. Cardiomediastinal silhouette is unchanged. IMPRESSION: Stable chronic low lung volumes are noted. Increased bibasilar linear densities are noted most consistent with worsening subsegmental atelectasis or possibly pneumonia. Electronically Signed   By: Marijo Conception, M.D.   On: 01/21/2015 20:01    ASSESSMENT /  PLAN:  PULMONARY A: Acute hypoxemic respiratory failure on BiPAP     Aspiration pneumonia P:  Continue IV antibiotics BiPAP and titrate fiO2 to keep O2 sat>90% DNR/no intubation. Father states that they would not want chest compressions if his heart were to stop. Unfortunately, we do not have any other recommendations to add to the care of this unfortunate patient.  Will sign-off at this time. Recall PCCM if advanced directives change  Hypernatremia, hyperchloremia, lactic acidosis, AKI, to be managed by hospitalist service.   Patient's parents at bedside; updated on current treatment plan.   Critical care time spent titrating BiPAP, reviewing history and labs, and CXR is 30 minutes   Magdalene S. [facility] ANP-BC Pulmonary and Critical Care Medicine [facility] Pager: [phone]  01/13/2015, 12:03 PM

## 2015-01-13 NOTE — Consult Note (Signed)
[facility]ious [facility]  Date of Admission:  01/10/2015  Date of Consult:  01/13/2015  Reason for Consult: Sacral Osteomyelitis Referring Physician: Alvino Chapel  Impression/Recommendation Zyvox induced thrombocytopenia Will stop zyvox  ARF Hypernatremia Would check random vanco level  Hydration per primary team  Osteomyelitis of sacrum  Possible HCAP Sepsis Will continue anbx, change to ceftaroline Add flagyl as well Will stop ceftaz Will check abd u/s Await BCx, UCx (high suspicion for UTI given recent instrumentation). UCx ngtd  abd distension  Await abd u/s result.     Thank you so much for this interesting consult,   [name] (pager) [phone] [email or web address]  Jeffery [name] is an 24 y.o. male.  HPI: 24 yo M with hx of recurrent neutropenia, cerebral palsy, chronic osteo/decubitus ulcer of L ischium. He was recently hospitalized 11-29 to 12-6 with fever, worsening of his osteo, and hepatitis/cholelithiasis (non-obstructive). His course was complicated by recurrent fevers, although he caregiver felt he was back to baseline. His course was also complicated by markedly high vanco levels (Cr 1.36). He was d/c home with invanz with plan for 6 weeks of total rx.  Per his caregiver, he has had diarrhea since d/c.  She i/o cath him with a suction cath at home prior to arrival.  He returns on 12-11 with fever to 103 and decreased urine output, increased work of breathing and yellow respiratory secretions. He was started on zyvox/ceftaz, fluid resuscitated. He was noted to have significantly worsened LFTs, Cr 1.73 and a serum Na 170. Lactate 5.2.  CXR showed: Stable chronic low lung volumes are noted. Increased bibasilar linear densities are noted most consistent with worsening subsegmental atelectasis or possibly pneumonia. In hospital, temp has been as high as 103.7. His PLT count has dropped by more than half.    Past Medical History    Diagnosis Date  . CP (cerebral palsy), spastic, quadriplegic ([place])   . Osteoporosis   . Undescended testes   . Seasonal allergies   . IVH (intraventricular hemorrhage) ([place]) 09-22-1990    Grade IV  . Hip dislocation, bilateral ([place])   . Dysphagia   . Retinopathy of prematurity   . 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 Neuromuscular scoliosis   . Osteoporosis   . Complex partial seizures ([place])   . Generalized convulsive epilepsy without mention of intractable epilepsy   . Sinus bradycardia     HR drops to 38-40 while sleeping  . Blister of right heel     fluid filled; origin unknown  . Kidney stones     ?  Marland Kitchen Pneumonia      chronic pneumonia ,respitory failure dx Augest 2014  . Aspiration pneumonia ([place])     "chronic" (04/12/2014)  . OSA treated with BiPAP     "since age 45"   . Anemia   . 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 GERD (gastroesophageal reflux disease)   . Epilepsy ([place])   . 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 Neutropenia ([place]) 07/03/2014    Past Surgical History  Procedure Laterality Date  . Mole removal  "several B/T 2008-2010"    "from all over"  . Jejunostomy feeding tube  03/08/2013; ~ 09/2013; ~ 01/2014    "transgastric-jujunal feeding tube"  . Gastrostomy tube placement  11./02/1998       . Button change  12/15/2010    Procedure: BUTTON CHANGE;  Surgeon: Ofilia Neas  Carlean Purl, MD;  Location: [facility];  Service: Endoscopy;  Laterality: N/A;  . Peg placement  10/07/2011    Procedure: PERCUTANEOUS ENDOSCOPIC GASTROSTOMY (PEG) REPLACEMENT;  Surgeon: Lafayette Dragon, MD;  Location: [facility];  Service: Endoscopy;  Laterality: N/A;  Needs 18 F 2.5 button ordered-dl  . Inguinal hernia repair Bilateral 1992  . Retinopathy of prematurity surgery  1992  . Achilles tendon lengthening Bilateral 12/1998  . Tendon release  12/1998    Soft tissue releases  wrists and fingers [Other]  . Infusion pump  implantation  07/25/2000; 2013    Intrathecal baclofen pump  . Peg placement N/A 06/05/2012    Procedure: PERCUTANEOUS ENDOSCOPIC GASTROSTOMY (PEG) REPLACEMENT;  Surgeon: Lafayette Dragon, MD;  Location: [facility];  Service: Endoscopy;  Laterality: N/A;  button 66f.2.5cm  . Strabismus surgery Bilateral 1993    "3 on right; 2 on left"  . Back surgery  ~ 2008    Harrington Rods in back needs to be log rolled  . Tendon repair Bilateral 09/05/2012    Procedure: LENGTHENING OF DIGITAL FLEXOR TENDONS BILTERAL HANDS;  Surgeon: DJolyn Nap MD;  [facility]  Service: Orthopedics;  Laterality: Bilateral;  . Peg placement N/A 09/13/2012    Procedure: PERCUTANEOUS ENDOSCOPIC GASTROSTOMY (PEG) REPLACEMENT;  Surgeon: DLafayette Dragon MD;  Location: [facility];  Service: Endoscopy;  Laterality: N/A;  . Flexible sigmoidoscopy N/A 10/30/2012    Procedure: FLEXIBLE SIGMOIDOSCOPY;  Surgeon: RCleotis Nipper MD;  Location: [facility];  Service: Endoscopy;  Laterality: N/A;  . Peg placement N/A 11/15/2012    Procedure: PERCUTANEOUS ENDOSCOPIC GASTROSTOMY (PEG) REPLACEMENT;  Surgeon: MJeryl Columbia MD;  [facility];  Service: Endoscopy;  Laterality: N/A;  . Gastrostomy tube placement  11./02/1998  . Rectal biopsy  10/29/2012    S/P diarrhea from Vancomycin  . Portacath placement Right 11/25/2012    chest  . Eye surgery       Allergies  Allergen Reactions  . Depakote [Divalproex Sodium] Other (See Comments)    Causes pancreatitis   . Keppra [Levetiracetam] Other (See Comments)    Bone marrow suppression  . Vimpat [Lacosamide] Rash  . Adhesive [Tape] Other (See Comments)    Rips skin off (paper tape is ok)  . Neulasta [Pegfilgrastim] Other (See Comments)    Fever, tachycardia    Medications:  Scheduled: . antiseptic oral rinse  7 mL Mouth Rinse q12n4p  . [START ON 01/14/2015] cefTAZidime (FORTAZ)  IV  2 g Intravenous Q24H  . chlorhexidine  15 mL Mouth Rinse BID  . cloBAZam  3.75 mg  Per Tube TID AC  . gabapentin  900 mg Per Tube 3 times per day  . levalbuterol  0.63 mg Nebulization Q6H  . metoCLOPramide  10 mg Per Tube TID AC & HS  . pantoprazole sodium  40 mg Per Tube BID  . sodium chloride  3 mL Intravenous Q12H  . sucralfate  1 g Per Tube 4 times per day    Abtx:  Anti-infectives    Start     Dose/Rate Route Frequency Ordered Stop   01/14/15 1200  cefTAZidime (FORTAZ) 2 g in dextrose 5 % 50 mL IVPB     2 g 100 mL/hr over 30 Minutes Intravenous Every 24 hours 01/13/15 1344     01/13/15 0845  linezolid (ZYVOX) IVPB 600 mg  Status:  Discontinued     600 mg 300 mL/hr over 60 Minutes Intravenous Every 12 hours 01/31/2015 2345 01/21/2015 2350  01/11/2015 2200  linezolid (ZYVOX) IVPB 600 mg     600 mg 300 mL/hr over 60 Minutes Intravenous Every 12 hours 01/04/2015 2020     01/21/2015 2200  cefTAZidime (FORTAZ) 2 g in dextrose 5 % 50 mL IVPB  Status:  Discontinued     2 g 100 mL/hr over 30 Minutes Intravenous Every 12 hours 01/28/2015 2034 01/13/15 1344      Total days of antibiotics: 13 (ceftaz, linezolid)          Social History:  reports that he has never smoked. He has never used smokeless tobacco. He reports that he does not drink alcohol or use illicit drugs.  Family History  Problem Relation Age of Onset  . Adopted: Yes    General ROS: unable to illict  Blood pressure 152/21, pulse 137, temperature 101.4 F (38.6 C), temperature source Oral, resp. rate 31, height 4' 11"  (1.499 m), weight 58.8 kg (129 lb 10.1 oz), SpO2 100 %. General appearance: alert, moderate distress and pale Eyes: negative findings: pupils equal, round, reactive to light and accomodation Throat: abnormal findings: dry mucosa Lungs: diminished breath sounds anterior - bilateral and rhonchi anterior - bilateral Heart: tachycardia Abdomen: abnormal findings:  distended, hypoactive bowel sounds and non-tender. peg site is clean, no d/c.  Extremities: edema anasarca. no palpable cordis u/e.    R chest port is clean.    Results for orders placed or performed during the hospital encounter of 01/08/2015 (from the past 48 hour(s))  Comprehensive metabolic panel     Status: Abnormal   Collection Time: 01/09/2015  7:21 PM  Result Value Ref Range   Sodium 170 (HH) 135 - 145 mmol/L    Comment: CRITICAL RESULT CALLED TO, READ BACK BY AND VERIFIED WITH: NEWNUM,K RN 01/22/2015 2016 WOOTEN,K    Potassium 5.2 (H) 3.5 - 5.1 mmol/L   Chloride >130 (HH) 101 - 111 mmol/L    Comment: CRITICAL RESULT CALLED TO, READ BACK BY AND VERIFIED WITH: NEWNUM,K RN 01/05/2015 2016 WOOTEN,K    CO2 26 22 - 32 mmol/L   Glucose, Bld 115 (H) 65 - 99 mg/dL   BUN 59 (H) 6 - 20 mg/dL   Creatinine, Ser 1.73 (H) 0.61 - 1.24 mg/dL   Calcium 8.4 (L) 8.9 - 10.3 mg/dL   Total Protein 5.2 (L) 6.5 - 8.1 g/dL   Albumin 2.1 (L) 3.5 - 5.0 g/dL   AST 979 (H) 15 - 41 U/L   ALT 414 (H) 17 - 63 U/L   Alkaline Phosphatase 1198 (H) 38 - 126 U/L   Total Bilirubin 0.6 0.3 - 1.2 mg/dL   GFR calc non Af Amer 54 (L) >60 mL/min   GFR calc Af Amer >60 >60 mL/min    Comment: (NOTE) The eGFR has been calculated using the CKD EPI equation. This calculation has not been validated in all clinical situations. eGFR's persistently <60 mL/min signify possible Chronic Kidney Disease.   CBC with Differential     Status: Abnormal   Collection Time: 01/05/2015  7:21 PM  Result Value Ref Range   WBC 5.9 4.0 - 10.5 K/uL   RBC 3.59 (L) 4.22 - 5.81 MIL/uL   Hemoglobin 11.7 (L) 13.0 - 17.0 g/dL   HCT 41.5 39.0 - 52.0 %   MCV 115.6 (H) 78.0 - 100.0 fL   MCH 32.6 26.0 - 34.0 pg   MCHC 28.2 (L) 30.0 - 36.0 g/dL   RDW 21.4 (H) 11.5 - 15.5 %   Platelets 219 150 -  400 K/uL    Comment: REPEATED TO VERIFY PLATELET COUNT CONFIRMED BY SMEAR    Neutrophils Relative % 83 %   Lymphocytes Relative 8 %   Monocytes Relative 7 %   Eosinophils Relative 1 %   Basophils Relative 1 %   Neutro Abs 4.8 1.7 - 7.7 K/uL   Lymphs Abs 0.5 (L) 0.7 - 4.0 K/uL    Monocytes Absolute 0.4 0.1 - 1.0 K/uL   Eosinophils Absolute 0.1 0.0 - 0.7 K/uL   Basophils Absolute 0.1 0.0 - 0.1 K/uL   Smear Review MORPHOLOGY UNREMARKABLE   Vancomycin, random     Status: None   Collection Time: 01/16/2015  7:21 PM  Result Value Ref Range   Vancomycin Rm <4 ug/mL    Comment:        Random Vancomycin therapeutic range is dependent on dosage and time of specimen collection. A peak range is 20.0-40.0 ug/mL A trough range is 5.0-15.0 ug/mL          I-stat Chem 8, ED     Status: Abnormal   Collection Time: 01/28/2015  7:35 PM  Result Value Ref Range   Sodium 171 (HH) 135 - 145 mmol/L   Potassium 5.0 3.5 - 5.1 mmol/L   Chloride 129 (H) 101 - 111 mmol/L   BUN 60 (H) 6 - 20 mg/dL   Creatinine, Ser 1.70 (H) 0.61 - 1.24 mg/dL   Glucose, Bld 111 (H) 65 - 99 mg/dL   Calcium, Ion 1.13 1.12 - 1.23 mmol/L   TCO2 27 0 - 100 mmol/L   Hemoglobin 12.6 (L) 13.0 - 17.0 g/dL   HCT 37.0 (L) 39.0 - 52.0 %   Comment NOTIFIED PHYSICIAN   I-Stat CG4 Lactic Acid, ED     Status: Abnormal   Collection Time: 01/05/2015  7:35 PM  Result Value Ref Range   Lactic Acid, Venous 5.21 (HH) 0.5 - 2.0 mmol/L   Comment NOTIFIED PHYSICIAN   Urine culture     Status: None (Preliminary result)   Collection Time: 01/27/2015  7:44 PM  Result Value Ref Range   Specimen Description URINE, RANDOM    Special Requests PEDS BAG    Culture NO GROWTH < 24 HOURS    Report Status PENDING   Urinalysis, Routine w reflex microscopic (not at [facility])     Status: Abnormal   Collection Time: 01/21/2015  7:44 PM  Result Value Ref Range   Color, Urine YELLOW YELLOW   APPearance CLOUDY (A) CLEAR   Specific Gravity, Urine 1.015 1.005 - 1.030   pH 5.5 5.0 - 8.0   Glucose, UA NEGATIVE NEGATIVE mg/dL   Hgb urine dipstick MODERATE (A) NEGATIVE   Bilirubin Urine NEGATIVE NEGATIVE   Ketones, ur NEGATIVE NEGATIVE mg/dL   Protein, ur 100 (A) NEGATIVE mg/dL   Nitrite NEGATIVE NEGATIVE   Leukocytes, UA NEGATIVE NEGATIVE  Urine  microscopic-add on     Status: Abnormal   Collection Time: 01/13/2015  7:44 PM  Result Value Ref Range   Squamous Epithelial / LPF 6-30 (A) NONE SEEN   WBC, UA 0-5 0 - 5 WBC/hpf   RBC / HPF 6-30 0 - 5 RBC/hpf   Bacteria, UA FEW (A) NONE SEEN  I-Stat Chem 8, ED     Status: Abnormal   Collection Time: 01/25/2015  8:57 PM  Result Value Ref Range   Sodium 172 (HH) 135 - 145 mmol/L   Potassium 4.5 3.5 - 5.1 mmol/L   Chloride 129 (H) 101 - 111  mmol/L   BUN 54 (H) 6 - 20 mg/dL   Creatinine, Ser 1.70 (H) 0.61 - 1.24 mg/dL   Glucose, Bld 125 (H) 65 - 99 mg/dL   Calcium, Ion 1.10 (L) 1.12 - 1.23 mmol/L   TCO2 23 0 - 100 mmol/L   Hemoglobin 10.5 (L) 13.0 - 17.0 g/dL   HCT 31.0 (L) 39.0 - 52.0 %   Comment NOTIFIED PHYSICIAN   I-Stat arterial blood gas, ED     Status: Abnormal   Collection Time: 01/03/2015  9:20 PM  Result Value Ref Range   pH, Arterial 7.320 (L) 7.350 - 7.450   pCO2 arterial 48.1 (H) 35.0 - 45.0 mmHg   pO2, Arterial 143.0 (H) 80.0 - 100.0 mmHg   Bicarbonate 24.0 20.0 - 24.0 mEq/L   TCO2 25 0 - 100 mmol/L   O2 Saturation 99.0 %   Acid-base deficit 1.0 0.0 - 2.0 mmol/L   Patient temperature 103.9 F    Collection site RADIAL, ALLEN'S TEST ACCEPTABLE    Drawn by RT    Sample type ARTERIAL   C difficile quick scan w PCR reflex     Status: None   Collection Time: 01/13/15 12:45 AM  Result Value Ref Range   C Diff antigen NEGATIVE NEGATIVE   C Diff toxin NEGATIVE NEGATIVE   C Diff interpretation Negative for toxigenic C. difficile   Lactic acid, plasma     Status: Abnormal   Collection Time: 01/13/15  1:50 AM  Result Value Ref Range   Lactic Acid, Venous 3.0 (HH) 0.5 - 2.0 mmol/L    Comment: CRITICAL RESULT CALLED TO, READ BACK BY AND VERIFIED WITH: IRBY T,RN 01/13/15 0247 WAYK   CBC     Status: Abnormal   Collection Time: 01/13/15  1:50 AM  Result Value Ref Range   WBC 6.0 4.0 - 10.5 K/uL   RBC 2.86 (L) 4.22 - 5.81 MIL/uL   Hemoglobin 9.1 (L) 13.0 - 17.0 g/dL   HCT 33.5  (L) 39.0 - 52.0 %   MCV 117.1 (H) 78.0 - 100.0 fL   MCH 31.8 26.0 - 34.0 pg   MCHC 27.2 (L) 30.0 - 36.0 g/dL   RDW 20.0 (H) 11.5 - 15.5 %   Platelets 111 (L) 150 - 400 K/uL    Comment: PLATELET COUNT CONFIRMED BY SMEAR  Creatinine, serum     Status: Abnormal   Collection Time: 01/13/15  1:50 AM  Result Value Ref Range   Creatinine, Ser 1.66 (H) 0.61 - 1.24 mg/dL   GFR calc non Af Amer 56 (L) >60 mL/min   GFR calc Af Amer >60 >60 mL/min    Comment: (NOTE) The eGFR has been calculated using the CKD EPI equation. This calculation has not been validated in all clinical situations. eGFR's persistently <60 mL/min signify possible Chronic Kidney Disease.   Magnesium     Status: None   Collection Time: 01/13/15  1:50 AM  Result Value Ref Range   Magnesium 1.8 1.7 - 2.4 mg/dL  Glucose, capillary     Status: Abnormal   Collection Time: 01/13/15  3:36 AM  Result Value Ref Range   Glucose-Capillary 139 (H) 65 - 99 mg/dL  Lactic acid, plasma     Status: Abnormal   Collection Time: 01/13/15  4:00 AM  Result Value Ref Range   Lactic Acid, Venous 2.6 (HH) 0.5 - 2.0 mmol/L    Comment: CRITICAL RESULT CALLED TO, READ BACK BY AND VERIFIED WITH: IRBY T,RN 01/13/15 0456 WAYK  Comprehensive metabolic panel     Status: Abnormal   Collection Time: 01/13/15  4:00 AM  Result Value Ref Range   Sodium 162 (HH) 135 - 145 mmol/L    Comment: DELTA CHECK NOTED CRITICAL RESULT CALLED TO, READ BACK BY AND VERIFIED WITH: IRBY T,RN 01/13/15 0457 WAYK    Potassium 3.8 3.5 - 5.1 mmol/L   Chloride >130 (HH) 101 - 111 mmol/L    Comment: CRITICAL RESULT CALLED TO, READ BACK BY AND VERIFIED WITH: IRBY T,RN 01/13/15 WAYK CALLED AT 0456    CO2 22 22 - 32 mmol/L   Glucose, Bld 150 (H) 65 - 99 mg/dL   BUN 51 (H) 6 - 20 mg/dL   Creatinine, Ser 1.58 (H) 0.61 - 1.24 mg/dL   Calcium 6.4 (LL) 8.9 - 10.3 mg/dL    Comment: CRITICAL RESULT CALLED TO, READ BACK BY AND VERIFIED WITH: BROWN R,RN 01/13/15 0510 WAYK      Total Protein 3.4 (L) 6.5 - 8.1 g/dL   Albumin 1.3 (L) 3.5 - 5.0 g/dL   AST 185 (H) 15 - 41 U/L   ALT 178 (H) 17 - 63 U/L   Alkaline Phosphatase 741 (H) 38 - 126 U/L   Total Bilirubin 0.9 0.3 - 1.2 mg/dL   GFR calc non Af Amer 60 (L) >60 mL/min   GFR calc Af Amer >60 >60 mL/min    Comment: (NOTE) The eGFR has been calculated using the CKD EPI equation. This calculation has not been validated in all clinical situations. eGFR's persistently <60 mL/min signify possible Chronic Kidney Disease.   CBC     Status: Abnormal   Collection Time: 01/13/15  4:00 AM  Result Value Ref Range   WBC 6.3 4.0 - 10.5 K/uL   RBC 2.53 (L) 4.22 - 5.81 MIL/uL   Hemoglobin 8.0 (L) 13.0 - 17.0 g/dL   HCT 29.7 (L) 39.0 - 52.0 %   MCV 117.4 (H) 78.0 - 100.0 fL   MCH 31.6 26.0 - 34.0 pg   MCHC 26.9 (L) 30.0 - 36.0 g/dL   RDW 20.0 (H) 11.5 - 15.5 %   Platelets 98 (L) 150 - 400 K/uL    Comment: CONSISTENT WITH PREVIOUS RESULT  Glucose, capillary     Status: Abnormal   Collection Time: 01/13/15  8:27 AM  Result Value Ref Range   Glucose-Capillary 115 (H) 65 - 99 mg/dL   Comment 1 Notify RN    Comment 2 Document in Chart   Occult blood card to lab, stool     Status: Abnormal   Collection Time: 01/13/15  9:39 AM  Result Value Ref Range   Fecal Occult Bld POSITIVE (A) NEGATIVE  Glucose, capillary     Status: Abnormal   Collection Time: 01/13/15 12:16 PM  Result Value Ref Range   Glucose-Capillary 127 (H) 65 - 99 mg/dL  Type and screen [place]     Status: None (Preliminary result)   Collection Time: 01/13/15 12:20 PM  Result Value Ref Range   ABO/RH(D) O POS    Antibody Screen NEG    Sample Expiration 01/16/2015    Unit Number [id number]    Blood Component Type RBC LR PHER1    Unit division 00    Status of Unit ISSUED    Transfusion Status OK TO TRANSFUSE    Crossmatch Result Compatible    Unit Number [id number]    Blood Component Type RED CELLS,LR    Unit division 00  Status of Unit ALLOCATED    Transfusion Status OK TO TRANSFUSE    Crossmatch Result Compatible   Prepare RBC     Status: None   Collection Time: 01/13/15 12:20 PM  Result Value Ref Range   Order Confirmation ORDER PROCESSED BY BLOOD BANK       Component Value Date/Time   SDES URINE, RANDOM 01/06/2015 1944   SPECREQUEST PEDS BAG 01/28/2015 1944   CULT NO GROWTH < 24 HOURS 01/29/2015 1944   REPTSTATUS PENDING 01/21/2015 1944   Dg Chest Portable 1 View  01/27/2015  CLINICAL DATA:  Fever. EXAM: PORTABLE CHEST 1 VIEW COMPARISON:  December 3rd 2016. FINDINGS: Stable chronic low lung volumes are noted. Stable surgical fusion of thoraco lumbar spine is noted. No pneumothorax is noted. Increased bibasilar linear densities are noted most consistent with worsening subsegmental atelectasis or possibly pneumonia. Cardiomediastinal silhouette is unchanged. IMPRESSION: Stable chronic low lung volumes are noted. Increased bibasilar linear densities are noted most consistent with worsening subsegmental atelectasis or possibly pneumonia. Electronically Signed   By: Marijo Conception, M.D.   On: 01/16/2015 20:01   Recent Results (from the past 240 hour(s))  C difficile quick scan w PCR reflex     Status: None   Collection Time: 01/04/15  4:01 PM  Result Value Ref Range Status   C Diff antigen NEGATIVE NEGATIVE Final   C Diff toxin NEGATIVE NEGATIVE Final   C Diff interpretation Negative for toxigenic C. difficile  Final  Urine culture     Status: None (Preliminary result)   Collection Time: 01/29/2015  7:44 PM  Result Value Ref Range Status   Specimen Description URINE, RANDOM  Final   Special Requests PEDS BAG  Final   Culture NO GROWTH < 24 HOURS  Final   Report Status PENDING  Incomplete  C difficile quick scan w PCR reflex     Status: None   Collection Time: 01/13/15 12:45 AM  Result Value Ref Range Status   C Diff antigen NEGATIVE NEGATIVE Final   C Diff toxin NEGATIVE NEGATIVE Final   C Diff  interpretation Negative for toxigenic C. difficile  Final      01/13/2015, 3:15 PM     LOS: 1 day    Records and images were personally reviewed where available.

## 2015-01-13 NOTE — Progress Notes (Signed)
Chaplain responded to the ED at the family request to provide spiritual support for patient due to his recent admission to the hospital, and for the family as they provide for his care.  The father who was present shared the extensive medical history of the patient who has been cared for by his parents all of his life to limited health factors.   Chaplain offered prayer for the patient's healing and wholeness and also for the emotional support of the parents. Follow up will continue as needed. Chaplain Yaakov Guthrie [phone]

## 2015-01-14 ENCOUNTER — Encounter: Payer: Self-pay | Admitting: Hematology & Oncology

## 2015-01-14 DIAGNOSIS — G809 Cerebral palsy, unspecified: Secondary | ICD-10-CM

## 2015-01-14 DIAGNOSIS — D61818 Other pancytopenia: Secondary | ICD-10-CM

## 2015-01-14 LAB — BASIC METABOLIC PANEL
Anion gap: 12 (ref 5–15)
Anion gap: 11 (ref 5–15)
BUN: 51 mg/dL — AB (ref 6–20)
BUN: 46 mg/dL — AB (ref 6–20)
CALCIUM: 7.4 mg/dL — AB (ref 8.9–10.3)
CHLORIDE: 116 mmol/L — AB (ref 101–111)
CO2: 21 mmol/L — ABNORMAL LOW (ref 22–32)
CO2: 20 mmol/L — AB (ref 22–32)
CREATININE: 1.75 mg/dL — AB (ref 0.61–1.24)
CREATININE: 1.57 mg/dL — AB (ref 0.61–1.24)
Calcium: 7.4 mg/dL — ABNORMAL LOW (ref 8.9–10.3)
Chloride: 115 mmol/L — ABNORMAL HIGH (ref 101–111)
GFR calc Af Amer: >60 mL/min (ref >60)
GFR calc Af Amer: >60 mL/min (ref >60)
GFR calc non Af Amer: 53 mL/min — ABNORMAL LOW (ref >60)
GLUCOSE: 116 mg/dL — AB (ref 65–99)
GLUCOSE: 95 mg/dL (ref 65–99)
POTASSIUM: 4.1 mmol/L (ref 3.5–5.1)
Potassium: 3.8 mmol/L (ref 3.5–5.1)
SODIUM: 149 mmol/L — AB (ref 135–145)
Sodium: 146 mmol/L — ABNORMAL HIGH (ref 135–145)

## 2015-01-14 LAB — GLUCOSE, CAPILLARY
GLUCOSE-CAPILLARY: 74 mg/dL (ref 65–99)
GLUCOSE-CAPILLARY: 75 mg/dL (ref 65–99)
GLUCOSE-CAPILLARY: 107 mg/dL — AB (ref 65–99)
GLUCOSE-CAPILLARY: 115 mg/dL — AB (ref 65–99)
GLUCOSE-CAPILLARY: 67 mg/dL (ref 65–99)
Glucose-Capillary: 77 mg/dL (ref 65–99)
Glucose-Capillary: 70 mg/dL (ref 65–99)
Glucose-Capillary: 47 mg/dL — ABNORMAL LOW (ref 65–99)

## 2015-01-14 LAB — TYPE AND SCREEN
ABO/RH(D): O POS
Antibody Screen: NEGATIVE
UNIT DIVISION: 0
Unit division: 0

## 2015-01-14 LAB — CBC
HEMATOCRIT: 39.3 % (ref 39.0–52.0)
Hemoglobin: 11.8 g/dL — ABNORMAL LOW (ref 13.0–17.0)
MCH: 30.3 pg (ref 26.0–34.0)
MCHC: 30 g/dL (ref 30.0–36.0)
MCV: 100.8 fL — AB (ref 78.0–100.0)
PLATELETS: 86 10*3/uL — AB (ref 150–400)
RBC: 3.9 MIL/uL — ABNORMAL LOW (ref 4.22–5.81)
RDW: 23 % — AB (ref 11.5–15.5)
WBC: 9.6 10*3/uL (ref 4.0–10.5)

## 2015-01-14 MED ORDER — DEXTROSE 5 % IV SOLN
INTRAVENOUS | Status: DC
  Administered 2015-01-14 (×2): via INTRAVENOUS

## 2015-01-14 MED ORDER — FUROSEMIDE 10 MG/ML IJ SOLN
20.0000 mg | Freq: Once | INTRAMUSCULAR | Status: AC
  Administered 2015-01-14: 20 mg via INTRAVENOUS
  Filled 2015-01-14: qty 2

## 2015-01-14 MED ORDER — DEXTROSE 50 % IV SOLN
INTRAVENOUS | Status: AC
  Administered 2015-01-14: 25 mL via INTRAVENOUS
  Filled 2015-01-14: qty 50

## 2015-01-14 NOTE — Progress Notes (Signed)
Pt's mother requesting 2 unit of blood to be given during night shift since no lasix order for in-between. MD aware, will relay to night shift RN.

## 2015-01-14 NOTE — Progress Notes (Signed)
I stopped by to check on Jeffery [name] this morning.  His fever is improved and he is currently off bipap.  Jeffery [name] called and spoke with his RN while I was on the floor, and she is sick with fever and will not be in to the hospital today.  Will plan for follow-up with Jeffery [name] when she returns to the hospital if she is open to meeting.  Please let us know if our team can be of further assistance in the interim.  Micheline Rough, MD [place] Team [phone]

## 2015-01-14 NOTE — Consult Note (Signed)
Referral MD  Reason for Referral: Pancytopenia. Recurrent sepsis.   Chief Complaint  Patient presents with  . Altered Mental Status  : He cannot give any history.  HPI: Jeffery [name] is well-known to me. He has cerebral palsy. He is bedbound. He is unable to communicate.  He has a history issues.  He recently was discharged after having an osteomyelitis. He was treated at home. Apparently, he developed some thrombocytopenia which was felt to be from Zyvox. When he was admitted, his white count was 6. Hemoglobin 9.1. Blood count 111.  Allergies IV fluids. His hemoglobin went down to 8. His leg, down to 98,000. He did get 2 units of blood.  His sodium on admission was 172. He was hypovolemic. His sodium is coming down with fluid replacement. So far, cultures have been negative.  He's been seen by infectious disease.  There is some concern about him having pneumonia.  He had a high fever when he came in. This is coming back down.  He is on a facemask I press for oxygen.  He gets tube feeds through the feeding tube that he has.  Whenever he does get sick, his bone marrow does tend to shed down little bit and he does tend to get a little more pancytopenia.    Past Medical History  Diagnosis Date  . 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 Osteoporosis   . Undescended testes   . Seasonal allergies   . IVH (intraventricular hemorrhage) ([place]) November 28, 1990    Grade IV  . Hip dislocation, bilateral ([place])   . Dysphagia   . Retinopathy of prematurity   . 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 Neuromuscular scoliosis   . Osteoporosis   . Complex partial seizures ([place])   . Generalized convulsive epilepsy without mention of intractable epilepsy   . Sinus bradycardia     HR drops to 38-40 while sleeping  . Blister of right heel     fluid filled; origin unknown  . Kidney stones     ?  Marland Kitchen Pneumonia      chronic pneumonia ,respitory failure dx Augest 2014  . Aspiration  pneumonia ([place])     "chronic" (04/12/2014)  . OSA treated with BiPAP     "since age 53"   . Anemia   . 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 GERD (gastroesophageal reflux disease)   . Epilepsy ([place])   . 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 Neutropenia ([place]) 07/03/2014  :  Past Surgical History  Procedure Laterality Date  . Mole removal  "several B/T 2008-2010"    "from all over"  . Jejunostomy feeding tube  03/08/2013; ~ 09/2013; ~ 01/2014    "transgastric-jujunal feeding tube"  . Gastrostomy tube placement  11./02/1998       . Button change  12/15/2010    Procedure: BUTTON CHANGE;  Surgeon: Gatha Mayer, MD;  Location: [facility];  Service: Endoscopy;  Laterality: N/A;  . Peg placement  10/07/2011    Procedure: PERCUTANEOUS ENDOSCOPIC GASTROSTOMY (PEG) REPLACEMENT;  Surgeon: Lafayette Dragon, MD;  Location: [facility];  Service: Endoscopy;  Laterality: N/A;  Needs 18 F 2.5 button ordered-dl  . Inguinal hernia repair Bilateral 1992  . Retinopathy of prematurity surgery  1992  . Achilles tendon lengthening Bilateral 12/1998  . Tendon release  12/1998    Soft tissue releases  wrists and fingers [Other]  . Infusion  pump implantation  07/25/2000; 2013    Intrathecal baclofen pump  . Peg placement N/A 06/05/2012    Procedure: PERCUTANEOUS ENDOSCOPIC GASTROSTOMY (PEG) REPLACEMENT;  Surgeon: Lafayette Dragon, MD;  Location: [facility];  Service: Endoscopy;  Laterality: N/A;  button 21fr.2.5cm  . Strabismus surgery Bilateral 1993    "3 on right; 2 on left"  . Back surgery  ~ 2008    Harrington Rods in back needs to be log rolled  . Tendon repair Bilateral 09/05/2012    Procedure: LENGTHENING OF DIGITAL FLEXOR TENDONS BILTERAL HANDS;  Surgeon: Jolyn Nap, MD;  Location: [facility];  Service: Orthopedics;  Laterality: Bilateral;  . Peg placement N/A 09/13/2012    Procedure: PERCUTANEOUS ENDOSCOPIC GASTROSTOMY (PEG) REPLACEMENT;  Surgeon:  Lafayette Dragon, MD;  Location: [facility];  Service: Endoscopy;  Laterality: N/A;  . Flexible sigmoidoscopy N/A 10/30/2012    Procedure: FLEXIBLE SIGMOIDOSCOPY;  Surgeon: Cleotis Nipper, MD;  Location: [facility];  Service: Endoscopy;  Laterality: N/A;  . Peg placement N/A 11/15/2012    Procedure: PERCUTANEOUS ENDOSCOPIC GASTROSTOMY (PEG) REPLACEMENT;  Surgeon: Jeryl Columbia, MD;  Location: [facility];  Service: Endoscopy;  Laterality: N/A;  . Gastrostomy tube placement  11./02/1998  . Rectal biopsy  10/29/2012    S/P diarrhea from Vancomycin  . Portacath placement Right 11/25/2012    chest  . Eye surgery    :   Current facility-administered medications:  .  acetaminophen (TYLENOL) solution 650 mg, 650 mg, Oral, Q6H PRN, 650 mg at 01/13/15 0918 **OR** acetaminophen (TYLENOL) suppository 650 mg, 650 mg, Rectal, Q6H PRN, Ripudeep K Rai, MD .  antiseptic oral rinse (CPC / CETYLPYRIDINIUM CHLORIDE 0.05%) solution 7 mL, 7 mL, Mouth Rinse, q12n4p, Edwin Dada, MD, 7 mL at 01/13/15 1200 .  baclofen (GABLOFEN) intrathecal injection 40000 mcg/43ml, 382 mcg, Intrathecal, Continuous, Edwin Dada, MD, 382 mcg at 01/13/15 0129 .  ceftaroline (TEFLARO) 300 mg in sodium chloride 0.9 % 250 mL IVPB, 300 mg, Intravenous, Q12H, Jake Church Masters, RPH, 300 mg at 01/14/15 [id number] .  chlorhexidine (PERIDEX) 0.12 % solution 15 mL, 15 mL, Mouth Rinse, BID, Edwin Dada, MD, 15 mL at 01/13/15 1000 .  cloBAZam (ONFI) 2.5 MG/ML oral suspension 3.75 mg, 3.75 mg, Per Tube, TID AC, Edwin Dada, MD, 3.75 mg at 01/14/15 0850 .  dextrose 5 % solution, , Intravenous, Continuous, Ripudeep K Rai, MD, Last Rate: 100 mL/hr at 01/14/15 0900 .  dextrose 50 % solution 50 mL, 1 ampule, Intravenous, PRN, Gardiner Barefoot, NP, 50 mL at 01/13/15 2232 .  dicyclomine (BENTYL) 10 MG/5ML syrup 10 mg, 10 mg, Per Tube, Q8H PRN, Edwin Dada, MD .  gabapentin (NEURONTIN) 250 MG/5ML solution  900 mg, 900 mg, Per Tube, 3 times per day, Edwin Dada, MD, 900 mg at 01/13/15 0149 .  glycopyrrolate (ROBINUL) tablet 1 mg, 1 mg, Per Tube, TID PRN, Edwin Dada, MD .  ibuprofen (ADVIL,MOTRIN) 100 MG/5ML suspension 600 mg, 600 mg, Oral, Q6H PRN, Ripudeep K Rai, MD, 600 mg at 01/13/15 1330 .  ketoconazole (NIZORAL) 2 % cream 1 application, 1 application, Topical, BID PRN, Edwin Dada, MD .  levalbuterol (XOPENEX) nebulizer solution 0.63 mg, 0.63 mg, Nebulization, Q6H PRN, Ripudeep K Rai, MD .  levalbuterol (XOPENEX) nebulizer solution 0.63 mg, 0.63 mg, Nebulization, Q6H, Ripudeep K Rai, MD, 0.63 mg at 01/14/15 0724 .  metoCLOPramide (REGLAN) 5 MG/5ML solution 10 mg, 10 mg, Per Tube, TID AC &  HS, Edwin Dada, MD, 10 mg at 01/13/15 0130 .  metroNIDAZOLE (FLAGYL) IVPB 500 mg, 500 mg, Intravenous, Q8H, Campbell Riches, MD, 500 mg at 01/14/15 0850 .  pantoprazole sodium (PROTONIX) 40 mg/20 mL oral suspension 40 mg, 40 mg, Per Tube, BID, Edwin Dada, MD, 40 mg at 01/14/15 0851 .  sodium chloride 0.9 % injection 3 mL, 3 mL, Intravenous, Q12H, Edwin Dada, MD, 3 mL at 01/13/15 1000 .  sucralfate (CARAFATE) 1 GM/10ML suspension 1 g, 1 g, Per Tube, 4 times per day, Edwin Dada, MD, 1 g at 01/13/15 0900 .  zinc oxide (BALMEX) 11.3 % cream, , Topical, PRN, Edwin Dada, MD:  . antiseptic oral rinse  7 mL Mouth Rinse q12n4p  . ceFTAROline (TEFLARO) IV  300 mg Intravenous Q12H  . chlorhexidine  15 mL Mouth Rinse BID  . cloBAZam  3.75 mg Per Tube TID AC  . gabapentin  900 mg Per Tube 3 times per day  . levalbuterol  0.63 mg Nebulization Q6H  . metoCLOPramide  10 mg Per Tube TID AC & HS  . metronidazole  500 mg Intravenous Q8H  . pantoprazole sodium  40 mg Per Tube BID  . sodium chloride  3 mL Intravenous Q12H  . sucralfate  1 g Per Tube 4 times per day  :  Allergies  Allergen Reactions  . Depakote [Divalproex Sodium] Other  (See Comments)    Causes pancreatitis   . Keppra [Levetiracetam] Other (See Comments)    Bone marrow suppression  . Vimpat [Lacosamide] Rash  . Adhesive [Tape] Other (See Comments)    Rips skin off (paper tape is ok)  . Neulasta [Pegfilgrastim] Other (See Comments)    Fever, tachycardia  :  Family History  Problem Relation Age of Onset  . Adopted: Yes  :  Social History   Social History  . Marital Status: Single    Spouse Name: N/A  . Number of Children: 0  . Years of Education: N/A   Occupational History  . Student    .     Social History Main Topics  . Smoking status: Never Smoker   . Smokeless tobacco: Never Used     Comment: never used tobacco  . Alcohol Use: No  . Drug Use: No  . Sexual Activity: No   Other Topics Concern  . Not on file   Social History Narrative   Pt lives at home with his legal guardians Jenne Campus)        :  Pertinent items are noted in HPI.  Exam: Patient Vitals for the past 24 hrs:  BP Temp Temp src Pulse Resp SpO2  01/14/15 0900 100/87 mmHg - - (!) 124 14 100 %  01/14/15 0830 - 97.9 F (36.6 C) Rectal - - -  01/14/15 0724 101/79 mmHg - - (!) 123 16 100 %  01/14/15 0309 (!) 117/93 mmHg 100.1 F (37.8 C) Rectal - - 100 %  01/14/15 0154 - - - - - 100 %  01/14/15 0010 (!) 105/93 mmHg 100.2 F (37.9 C) Rectal (!) 135 (!) 29 -  01/13/15 2333 - 100 F (37.8 C) Rectal - - -  01/13/15 2145 99/75 mmHg 99.9 F (37.7 C) Rectal (!) 132 19 100 %  01/13/15 2100 91/71 mmHg 99.6 F (37.6 C) Rectal (!) 131 20 100 %  01/13/15 2008 90/72 mmHg 99.5 F (37.5 C) Rectal (!) 131 18 100 %  01/13/15 1942 - - - - -  99 %  01/13/15 1700 (!) 129/107 mmHg - - (!) 124 18 100 %  01/13/15 1641 (!) 107/59 mmHg 98.8 F (37.1 C) Rectal (!) 124 19 100 %  01/13/15 1502 - (!) 101.4 F (38.6 C) Oral - - -  01/13/15 1500 128/87 mmHg - - 99 (!) 31 -  01/13/15 1449 (!) 152/21 mmHg (!) 102.3 F (39.1 C) Rectal (!) 137 (!) 31 100 %  01/13/15 1434 (!) 120/22  mmHg - - (!) 141 (!) 41 100 %  01/13/15 1415 - (!) 102.4 F (39.1 C) Rectal (!) 143 (!) 38 100 %  01/13/15 1255 (!) 153/88 mmHg - - (!) 150 (!) 42 98 %  01/13/15 1203 - (!) 103.7 F (39.8 C) Oral - - -  01/13/15 1124 - - - (!) 152 (!) 56 94 %    as above    Recent Labs  01/13/15 0400 01/14/15 0200  WBC 6.3 9.6  HGB 8.0* 11.8*  HCT 29.7* 39.3  PLT 98* 86*    Recent Labs  01/14/15 0200 01/14/15 0900  NA 149* 146*  K 4.1 3.8  CL 116* 115*  CO2 21* 20*  GLUCOSE 116* 95  BUN 51* 46*  CREATININE 1.75* 1.57*  CALCIUM 7.4* 7.4*    Blood smear review: None  Pathology: None     Assessment and Plan:  Lucky Cowboy is a 24 year old white male. He has pancytopenia. I suspect this is all part of his overall cerebral palsy picture. Whenever he gets septic, the pancytopenia worsens.  He usually has aspiration as a source of the sepsis.  We did go ahead and transfuse her with 2 units of blood.  It is not a surprise clinic count is dropping. He is not bleeding so will not worry about this at this point.  I am glad that his sodium is getting better. I think that the sodium of 172 on admission is a clear indicator of dysfunction within his metabolic controls.  We will follow along and try to help out in any way possible.  I do feel bad that he is being hospitalized so quickly again.  I appreciate all the wonderful care that he is getting by the staff in the ICU.   Waldemar Dickens 37:4

## 2015-01-14 NOTE — Progress Notes (Signed)
Pt's father worried about pt's swelling in bilateral arms and belly, paged Dr Tana Coast, received order to decrease IV fluids and give 20mg  IV lasix. Consuelo Pandy RN

## 2015-01-14 NOTE — Progress Notes (Signed)
Spoke with pt's mother , she states she will not come to the hospital today because she is very sick and running a fever of 102.0. She let me know what medications she wanted pt to have and medications she does not want pt to have today. Consuelo Pandy RN

## 2015-01-14 NOTE — Progress Notes (Signed)
   01/14/15 1000  Clinical Encounter Type  Visited With Health care provider  Visit Type Follow-up  Ch met with RN and Palliative Care team regarding pt; Mother not present; pt not available. 10:45 AM Gwynn Burly

## 2015-01-14 NOTE — Progress Notes (Signed)
Turned pt on to left side to keep him off wound on bottom and father asked be to keep him supine on his back, he stated we did not need to turn him on his side every two hours. I educated on the importance of keeping pt turned to help heal wound and to prevent further skin breakdown. Consuelo Pandy RN

## 2015-01-14 NOTE — Progress Notes (Signed)
Pt in order for IV team to change Huber needle in port a cath. Father states it has been in 7 days today. Mother states Charisse March needle is a 20g. Consuelo Pandy RN

## 2015-01-14 NOTE — Progress Notes (Signed)
[facility]                                                                              Patient Demographics  Jeffery [name], is a 24 y.o. male, DOB - 05-Apr-1990, HC:[id number]  Admit date - 01/10/2015   Admitting Physician Edwin Dada, MD  Outpatient Primary MD for the patient is [name], Monia Sabal, MD  LOS - 2   Chief Complaint  Patient presents with  . Altered Mental Status       Brief HPI   Jeffery [name] is a 24 y.o. male with a past medical history significant for CP with quadruplegia, J-tube, epilepsy, pancytopenia from antiepileptics, OSA/chronic respiratory failure on nightly BiPAP, history MRSA and recently discovered osteomyelitis presented with acute respiratory failure and severe sepsis. The patient was recently hospitalized from 11/29-12/6 for sepsis with unclear source (suspected aspiration versus osteomyelitis) and discharged 5 days ago on daily ertapenem. At the time of discharge, his vancomycin level was 54 ug/mL and so this was held. The patient's guardian, [name] reported that on the day of admission he had fever to 103F, seemed to have increased yellowish secretions that she had to suction, shortness of breath, and was working harder to breath, decreased level of consciousness with lethargy. In the ED, the patient was hypotensive, HR 140 bpm, RR 50 times per minute, had sodium 170 mmol/L, creatinine 1.73 mg/dL from baseline 1.3 mg/dL at discharge, worsening AST/ALT/alkaline phosphatase, and lactate 5.21 mmol/L. ID and CCM consult were obtained, patient was started on linezolid and ceftazidime and admitted to stepdown unit for further workup with sepsis protocol.    Assessment & Plan    Primary problem Severe sepsis, unknown source: Possibly due to aspiration PNA versus central line. UA negative for UTI. Patient meets criteria given tachycardia, tachypnea, fever, and evidence of organ dysfunction. - Follow blood cultures, lactic acid  improving.  - CCM, ID following, patient started on IV Teflaro, Flagyl, as recommendation from infectious disease - Continue BiPAP as needed per RT  Active problems Severe Hypernatremia: Severe hyponatremia with sodium of 171 at the time of admission, creatinine 1.73, Cl >130, lactic acidosis, in setting of CP/quadruplegia and copious watery diarrhea. -Continue fluid resuscitation, free water deficit 7.8L calculated on admission - Sodium has now improved to 146, decrease D5 drip 200 mL an hour, followed BMET  - Seizure precautions  Acute kidney injury In setting of sepsis and hypovolemia. -Fluid resuscitation as above, creatinine improving 1.5 today  Transaminitis:  Acute on chronic, in the setting of sepsis, family have declined workup, and worsening is expected given severe sepsis. -Trend CMP   Macrocytic anemia - Hemoglobin of 8.0 on 12/12, received 2 units of packed RBC transfusion per Dr. Antonieta Pert recommendations. - Stool occult test positive, hemoglobin 11.8. If trending down, will consult gastroenterology and check CT abdomen and pelvis to rule out any colitis. - Abdominal ultrasound on 12/12 showed no acute abnormality in the abdomen  Diarrhea - Continue rectal tube, follow stool studies, C. difficile negative  Epilepsy:  -Continue clobazam and gabapentin  Protein calorie malnutrition Nutrition consult once stable   Code Status: DO NOT RESUSCITATE  Family Communication: No family member present at the bedside today, explained all imaging results, lab results explained to the patient's mother at the bedside yesterday.   Disposition Plan: Palliative medicine also consulted  Time Spent in minutes   25 minutes  Procedures  Chest x-ray Abdominal ultrasound  Consults   CCM ID Hematology oncology  DVT Prophylaxis SCD  Medications  Scheduled Meds: . antiseptic oral rinse  7 mL Mouth Rinse q12n4p  . ceFTAROline (TEFLARO) IV  300 mg Intravenous Q12H  .  chlorhexidine  15 mL Mouth Rinse BID  . cloBAZam  3.75 mg Per Tube TID AC  . gabapentin  900 mg Per Tube 3 times per day  . levalbuterol  0.63 mg Nebulization Q6H  . metoCLOPramide  10 mg Per Tube TID AC & HS  . metronidazole  500 mg Intravenous Q8H  . pantoprazole sodium  40 mg Per Tube BID  . sodium chloride  3 mL Intravenous Q12H  . sucralfate  1 g Per Tube 4 times per day   Continuous Infusions: . baclofen    . dextrose 100 mL/hr at 01/14/15 0900   PRN Meds:.acetaminophen (TYLENOL) oral liquid 160 mg/5 mL **OR** acetaminophen, dextrose, dicyclomine, glycopyrrolate, ibuprofen, ketoconazole, levalbuterol, zinc oxide   Antibiotics   Anti-infectives    Start     Dose/Rate Route Frequency Ordered Stop   01/14/15 1200  cefTAZidime (FORTAZ) 2 g in dextrose 5 % 50 mL IVPB  Status:  Discontinued     2 g 100 mL/hr over 30 Minutes Intravenous Every 24 hours 01/13/15 1344 01/13/15 1553   01/13/15 1800  ceftaroline (TEFLARO) 300 mg in sodium chloride 0.9 % 250 mL IVPB     300 mg 250 mL/hr over 60 Minutes Intravenous Every 12 hours 01/13/15 1558     01/13/15 1700  ceftaroline (TEFLARO) 300 mg in sodium chloride 0.9 % 250 mL IVPB  Status:  Discontinued     300 mg 250 mL/hr over 60 Minutes Intravenous Every 12 hours 01/13/15 1557 01/13/15 1558   01/13/15 1600  metroNIDAZOLE (FLAGYL) IVPB 500 mg     500 mg 100 mL/hr over 60 Minutes Intravenous Every 8 hours 01/13/15 1554     01/13/15 0845  linezolid (ZYVOX) IVPB 600 mg  Status:  Discontinued     600 mg 300 mL/hr over 60 Minutes Intravenous Every 12 hours 01/21/2015 2345 01/31/2015 2350   01/25/2015 2200  linezolid (ZYVOX) IVPB 600 mg  Status:  Discontinued     600 mg 300 mL/hr over 60 Minutes Intravenous Every 12 hours 01/23/2015 2020 01/13/15 1532   01/02/2015 2200  cefTAZidime (FORTAZ) 2 g in dextrose 5 % 50 mL IVPB  Status:  Discontinued     2 g 100 mL/hr over 30 Minutes Intravenous Every 12 hours 01/23/2015 2034 01/13/15 1344         Subjective:   Jeffery [name] was seen and examined today. Cerebral palsy, on BiPAP, unable to provide any review of systems. Afebrile, Tachycardiac.   Objective:   Blood pressure 100/87, pulse 124, temperature 97.9 F (36.6 C), temperature source Rectal, resp. rate 14, height 4\' 11"  (1.499 m), weight 58.8 kg (129 lb 10.1 oz), SpO2 100 %.  Wt Readings from Last 3 Encounters:  01/16/2015 58.8 kg (129 lb 10.1 oz)  01/06/15 60.147 kg (132 lb 9.6 oz)  10/19/14 64.5 kg (142 lb 3.2 oz)     Intake/Output Summary (Last 24 hours) at 01/14/15 1106 Last data filed at 01/14/15 0900  Gross per  24 hour  Intake 3130.75 ml  Output    900 ml  Net 2230.75 ml    Exam  General: On BiPAP  HEENT:  PERRLA, EOMI,   Neck: Supple, no JVD, no masses  CVS: S1 S2 clear, tachycardia  Respiratory: Decreased breath sounds with scattered rhonchi  Abdomen: Soft, G-tube  Ext: contractures in the upper and lower extremities  Neuro: unable to assess, muscle atrophic  Skin: Sacral decub ulcer   Psych: on BiPAP, spontaneously opens eyes but otherwise not responding to any commands   Data Review   Micro Results Recent Results (from the past 240 hour(s))  C difficile quick scan w PCR reflex     Status: None   Collection Time: 01/04/15  4:01 PM  Result Value Ref Range Status   C Diff antigen NEGATIVE NEGATIVE Final   C Diff toxin NEGATIVE NEGATIVE Final   C Diff interpretation Negative for toxigenic C. difficile  Final  Culture, blood (routine x 2)     Status: None (Preliminary result)   Collection Time: 01/04/2015  2:21 PM  Result Value Ref Range Status   Specimen Description PORTA CATH A-LINE  Final   Special Requests BOTTLES DRAWN AEROBIC AND ANAEROBIC 5ML  Final   Culture NO GROWTH < 24 HOURS  Final   Report Status PENDING  Incomplete  Culture, blood (routine x 2)     Status: None (Preliminary result)   Collection Time: 01/10/2015  7:30 PM  Result Value Ref Range Status   Specimen  Description BLOOD LEFT HAND  Final   Special Requests PEDIATRICS 2CC  Final   Culture NO GROWTH < 24 HOURS  Final   Report Status PENDING  Incomplete  Urine culture     Status: None   Collection Time: 01/16/2015  7:44 PM  Result Value Ref Range Status   Specimen Description URINE, RANDOM  Final   Special Requests PEDS BAG  Final   Culture 4,000 COLONIES/mL INSIGNIFICANT GROWTH  Final   Report Status 01/13/2015 FINAL  Final  C difficile quick scan w PCR reflex     Status: None   Collection Time: 01/13/15 12:45 AM  Result Value Ref Range Status   C Diff antigen NEGATIVE NEGATIVE Final   C Diff toxin NEGATIVE NEGATIVE Final   C Diff interpretation Negative for toxigenic C. difficile  Final    Radiology Reports Ct Pelvis Wo Contrast  01/01/2015  CLINICAL DATA:  Cerebral palsy.  Clinical concern for osteomyelitis. EXAM: CT PELVIS WITHOUT CONTRAST TECHNIQUE: Multidetector CT imaging of the pelvis was performed following the standard protocol without intravenous contrast. COMPARISON:  10/15/2014 CT abdomen/pelvis. FINDINGS: Reproductive: Normal prostate and seminal vesicles. Bladder: Collapsed and grossly normal urinary bladder. Visualized ureters are normal caliber. Bowel: Visualized small and large bowel are normal caliber with no bowel wall thickening. Oral contrast traverses to the distal rectum. Partially visualized is an enteric tube in the distal duodenum and proximal jejunum, with the tip not seen on this study. Vascular/Lymphatic: No pathologically enlarged lymph nodes in the pelvis. Other: No pneumoperitoneum, ascites or focal fluid collection. Musculoskeletal: There is curvilinear gas, fat stranding and ill-defined fluid in the posterior medial left gluteal subcutaneous soft tissues (series 4/ image 125), suggestive of an incompletely visualized decubitus ulcer, with extension of the fat stranding and fluid to the inferior margin of the left ischium. There is mild focal cortical erosion at  the inferior left ischium with associated periosteal reaction, in keeping with osteomyelitis. No presacral fluid or fat  stranding. No evidence of a sacral decubitus ulcer. Subcutaneous ventral right lower quadrant spinal stimulator device is noted with lead coursing posteriorly and entering the lumbar spinal canal, with the tip not visualized on this study. No aggressive appearing focal osseous lesions. 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 There is stable diffuse osteopenia. No osseous fracture. No additional focal cortical erosions or periosteal reaction. Partially visualized is bilateral posterior spinal fusion hardware with posterior bilateral fusion rods and interlocking screws extending into the bilateral iliac wings, with no evidence of hardware fracture or loosening. IMPRESSION: 1. Osteomyelitis of the inferior left ischium associated with curvilinear fat stranding, fluid and gas in the posteromedial left gluteal subcutaneous soft tissues, presumably due to a medial left gluteal decubitus ulcer (which is incompletely visualized on this study). No focal drainable fluid collection. 2. No pelvic ascites or intraperitoneal fluid collection. No presacral fluid or inflammatory change. 3. 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 Electronically Signed   By: Ilona Sorrel M.D.   On: 01/01/2015 10:34   US Abdomen Complete  01/13/2015  CLINICAL DATA:  Acute onset of abdominal distention and sepsis. Initial encounter. EXAM: ULTRASOUND ABDOMEN COMPLETE COMPARISON:  Right upper quadrant ultrasound performed 01/05/2015, and CT of the abdomen and pelvis performed 10/15/2014 FINDINGS: Gallbladder: Stones are noted dependently in the gallbladder. The gallbladder wall is borderline prominent. No pericholecystic fluid is seen.  Evaluation for ultrasonographic Murphy's sign is not possible as the patient is unconscious. Common bile duct: Diameter: 0.4 cm, within normal limits in caliber. Liver: No focal lesion identified. Within normal limits in parenchymal echogenicity. Difficult to fully assess due to the midline pain pump. IVC: No abnormality visualized. Pancreas: Not visualized. Spleen: Size and appearance within normal limits. Right Kidney: Length: 9.4 cm. Not fully assessed due to overlying bowel gas. Echogenicity within normal limits. No mass or hydronephrosis visualized. Left Kidney: Not fully assessed due to overlying bowel gas. Echogenicity within normal limits. No mass or hydronephrosis visualized. Abdominal aorta: Not visualized due to overlying bowel gas. Other findings: None. IMPRESSION: 1. No acute abnormality seen within the abdomen. Evaluation is significantly suboptimal due to bowel gas. 2. Cholelithiasis. Borderline prominent gallbladder wall. Gallbladder otherwise grossly unremarkable. Electronically Signed   By: Garald Balding M.D.   On: 01/13/2015 23:07   Ir Replc Duoden/jejuno Tube Percut W/fluoro  01/01/2015  CLINICAL DATA:  Routine jejunostomy tube exchange EXAM: IR REPLACE DUODEN/JEJUNO TUBE PERCUT WITH FLOURO FLUOROSCOPY TIME:  12 seconds MEDICATIONS AND MEDICAL HISTORY: None ANESTHESIA/SEDATION: None CONTRAST:  5 cc Omnipaque 300 PROCEDURE: The procedure, risks, benefits, and alternatives were explained to the patient. Questions regarding the procedure were encouraged and answered. The patient understands and consents to the procedure. The abdomen was prepped with Betadine in a sterile fashion, and a sterile drape was applied covering the operative field. A sterile gown and sterile gloves were used for the procedure. The existing gastrojejunostomy tube was removed over a stiff glidewire and exchange for a new make jejunostomy tube. The tip was positioned in the proximal jejunum. Contrast was injected. 10 cc  saline was utilized to insufflate the balloon. FINDINGS: The jejunostomy tube is been exchange. Tip is in the proximal jejunum. COMPLICATIONS: None IMPRESSION: Successful jejunostomy tube exchange. Electronically Signed   By: Marybelle Killings M.D.   On: 01/01/2015 12:18   Dg Chest Portable 1 View  01/14/2015  CLINICAL DATA:  Fever. EXAM: PORTABLE CHEST 1 VIEW COMPARISON:  December 3rd 2016. FINDINGS: Stable chronic low lung volumes are noted. Stable surgical fusion of thoraco lumbar spine is noted. No pneumothorax is noted. Increased bibasilar linear densities are noted most consistent with worsening subsegmental atelectasis or possibly pneumonia. Cardiomediastinal silhouette is unchanged. IMPRESSION: Stable chronic low lung volumes are noted. Increased bibasilar linear densities are noted most consistent with worsening subsegmental atelectasis or possibly pneumonia. Electronically Signed   By: Marijo Conception, M.D.   On: 01/27/2015 20:01   Dg Chest Port 1 View  01/04/2015  CLINICAL DATA:  Shortness of breath and fever.  Recent pneumonia. EXAM: PORTABLE CHEST 1 VIEW COMPARISON:  01/01/2015 and 12/31/2014. FINDINGS: 0814 hours. There are chronic low lung volumes. The patient's mandible overlies the right lung apex. Chronic bibasilar atelectasis or scarring is unchanged. There is no airspace disease, edema or pleural effusion. Right subclavian Port-A-Cath appears unchanged. The bones appear unchanged status post thoracolumbar fusion. IMPRESSION: No significant change in chronic bibasilar atelectasis or scarring. No acute findings demonstrated. Electronically Signed   By: Richardean Sale M.D.   On: 01/04/2015 10:29   Portable Chest 1 View  01/01/2015  CLINICAL DATA:  Shortness of breath, cough, dyspnea.  Cerebral palsy EXAM: PORTABLE CHEST 1 VIEW COMPARISON:  Yesterday FINDINGS: Porta catheter on the right with tip in good position at the SVC level. Persistent hypoventilation and linear opacities at the bases.  There is no edema, consolidation, effusion, or pneumothorax. Normal heart size and mediastinal contours. Extensive spinal fixation. IMPRESSION: Chronic hypoventilation with basilar atelectasis or scarring. Electronically Signed   By: Monte Fantasia M.D.   On: 01/01/2015 07:40   Dg Chest Port 1 View  12/31/2014  CLINICAL DATA:  Chest pain recent pneumonia persistent fever for 1 week EXAM: PORTABLE CHEST 1 VIEW COMPARISON:  10/19/2014 FINDINGS: Spinal rods noted. Port-A-Cath stable on the right. Low lung volumes with bibasilar atelectasis. Heart size normal. IMPRESSION: Mild bilateral lower lobe atelectasis. No definite evidence of pneumonia. Subtle infiltrate could be due obscured by the bilateral lower lobe atelectasis however. Electronically Signed   By: Skipper Cliche M.D.   On: 12/31/2014 17:39   US Abdomen Limited Ruq  01/05/2015  CLINICAL DATA:  Elevated liver function tests. Cerebral palsy. Feeding tube and implanted pain pump in the right upper quadrant of the abdomen overlying the gallbladder area. EXAM: US ABDOMEN LIMITED - RIGHT UPPER QUADRANT COMPARISON:  Limited abdomen ultrasound dated 11/06/2014. Abdomen CT dated 10/15/2014. FINDINGS: Gallbladder: Multiple gallstones in the gallbladder measuring up to 7 mm in maximum diameter each. No gallbladder wall thickening or pericholecystic fluid. No apparent pain over the gallbladder. Common bile duct: Diameter: 3.8 mm Liver: Normal echogenicity.  No mass or biliary ductal dilatation seen. IMPRESSION: 1. Cholelithiasis without evidence cholecystitis. 2. Otherwise, unremarkable examination. Electronically Signed   By: Claudie Revering M.D.   On: 01/05/2015 15:59    CBC  Recent Labs Lab 01/27/2015 1921 01/09/2015 1935 01/07/2015 2057 01/13/15 0150 01/13/15 0400 01/14/15 0200  WBC 5.9  --   --  6.0 6.3 9.6  HGB 11.7* 12.6* 10.5* 9.1* 8.0* 11.8*  HCT 41.5 37.0* 31.0* 33.5* 29.7* 39.3  PLT 219  --   --  111* 98* 86*  MCV 115.6*  --   --  117.1*  117.4* 100.8*  MCH 32.6  --   --  31.8 31.6 30.3  MCHC 28.2*  --   --  27.2* 26.9* 30.0  RDW 21.4*  --   --  20.0* 20.0* 23.0*  LYMPHSABS 0.5*  --   --   --   --   --   MONOABS 0.4  --   --   --   --   --   EOSABS 0.1  --   --   --   --   --   BASOSABS 0.1  --   --   --   --   --     Chemistries   Recent Labs Lab 01/14/2015 1921  01/03/2015 2057 01/13/15 0150 01/13/15 0400 01/13/15 1740 01/14/15 0200 01/14/15 0900  NA 170*  < > 172*  --  162* 150* 149* 146*  K 5.2*  < > 4.5  --  3.8 4.1 4.1 3.8  CL >130*  < > 129*  --  >130* 120* 116* 115*  CO2 26  --   --   --  22 20* 21* 20*  GLUCOSE 115*  < > 125*  --  150* 161* 116* 95  BUN 59*  < > 54*  --  51* 52* 51* 46*  CREATININE 1.73*  < > 1.70* 1.66* 1.58* 1.83* 1.75* 1.57*  CALCIUM 8.4*  --   --   --  6.4* 7.1* 7.4* 7.4*  MG  --   --   --  1.8  --   --   --   --   AST 979*  --   --   --  185*  --   --   --   ALT 414*  --   --   --  178*  --   --   --   ALKPHOS 1198*  --   --   --  741*  --   --   --   BILITOT 0.6  --   --   --  0.9  --   --   --   < > = values in this interval not displayed. ------------------------------------------------------------------------------------------------------------------ estimated creatinine clearance is 53.5 mL/min (by C-G formula based on Cr of 1.57). ------------------------------------------------------------------------------------------------------------------ No results for input(s): HGBA1C in the last 72 hours. ------------------------------------------------------------------------------------------------------------------ No results for input(s): CHOL, HDL, LDLCALC, TRIG, CHOLHDL, LDLDIRECT in the last 72 hours. ------------------------------------------------------------------------------------------------------------------ No results for input(s): TSH, T4TOTAL, T3FREE, THYROIDAB in the last 72 hours.  Invalid input(s):  FREET3 ------------------------------------------------------------------------------------------------------------------ No results for input(s): VITAMINB12, FOLATE, FERRITIN, TIBC, IRON, RETICCTPCT in the last 72 hours.  Coagulation profile No results for input(s): INR, PROTIME in the last 168 hours.  No results for input(s): DDIMER in the last 72 hours.  Cardiac Enzymes No results for input(s): CKMB, TROPONINI, MYOGLOBIN in the last 168 hours.  Invalid input(s): CK ------------------------------------------------------------------------------------------------------------------ Invalid input(s): Alexander  01/13/15 1500 01/13/15 2012 01/13/15 2121 01/13/15 2335 01/14/15 0311 01/14/15 0829  GLUCAP 114* 48* 77* 87 77 49     RAI,RIPUDEEP M.D. [facility] 01/14/2015, 11:06 AM  Pager: [phone] Between 7am to 7pm - call Pager - [phone]  After 7pm go to [email or web address] - password [email or web address]  Call night coverage person covering after 7pm

## 2015-01-14 NOTE — Progress Notes (Signed)
Hypoglycemic Event  CBG: 67  Treatment: D50 IV 25 mL  Symptoms: None  Follow-up CBG: Time: 2255 CBG Result: 115  Possible Reasons for Event: Inadequate meal intake and Unknown  Comments/MD notified: Yes, no new orders received.    Jeffery [name]

## 2015-01-14 NOTE — Progress Notes (Signed)
Pt taken off bipap at 0926 and placed on 5L . RT will continue to monitor.

## 2015-01-14 NOTE — Progress Notes (Signed)
INFECTIOUS DISEASE PROGRESS NOTE  ID: Jeffery [name] is a 24 y.o. male with  Principal Problem:   Severe sepsis ([place]) Active Problems:   Acute on chronic respiratory failure with hypoxia ([place])   Cerebral palsy, quadriplegic (HCC)   Acquired pancytopenia (HCC)   Transaminitis   Seizures (HCC)   Pressure ulcer   Osteomyelitis ([place])   Malnutrition ([place])   Hypernatremia   AKI (acute kidney injury) ([place])  Subjective: Minimally interactive with suctioning  Abtx:  Anti-infectives    Start     Dose/Rate Route Frequency Ordered Stop   01/14/15 1200  cefTAZidime (FORTAZ) 2 g in dextrose 5 % 50 mL IVPB  Status:  Discontinued     2 g 100 mL/hr over 30 Minutes Intravenous Every 24 hours 01/13/15 1344 01/13/15 1553   01/13/15 1800  ceftaroline (TEFLARO) 300 mg in sodium chloride 0.9 % 250 mL IVPB     300 mg 250 mL/hr over 60 Minutes Intravenous Every 12 hours 01/13/15 1558     01/13/15 1700  ceftaroline (TEFLARO) 300 mg in sodium chloride 0.9 % 250 mL IVPB  Status:  Discontinued     300 mg 250 mL/hr over 60 Minutes Intravenous Every 12 hours 01/13/15 1557 01/13/15 1558   01/13/15 1600  metroNIDAZOLE (FLAGYL) IVPB 500 mg     500 mg 100 mL/hr over 60 Minutes Intravenous Every 8 hours 01/13/15 1554     01/13/15 0845  linezolid (ZYVOX) IVPB 600 mg  Status:  Discontinued     600 mg 300 mL/hr over 60 Minutes Intravenous Every 12 hours 01/22/2015 2345 01/15/2015 2350   01/25/2015 2200  linezolid (ZYVOX) IVPB 600 mg  Status:  Discontinued     600 mg 300 mL/hr over 60 Minutes Intravenous Every 12 hours 01/30/2015 2020 01/13/15 1532   01/21/2015 2200  cefTAZidime (FORTAZ) 2 g in dextrose 5 % 50 mL IVPB  Status:  Discontinued     2 g 100 mL/hr over 30 Minutes Intravenous Every 12 hours 01/09/2015 2034 01/13/15 1344      Medications:  Scheduled: . antiseptic oral rinse  7 mL Mouth Rinse q12n4p  . ceFTAROline (TEFLARO) IV  300 mg Intravenous Q12H  . chlorhexidine  15 mL Mouth Rinse BID  . cloBAZam   3.75 mg Per Tube TID AC  . gabapentin  900 mg Per Tube 3 times per day  . levalbuterol  0.63 mg Nebulization Q6H  . metoCLOPramide  10 mg Per Tube TID AC & HS  . metronidazole  500 mg Intravenous Q8H  . pantoprazole sodium  40 mg Per Tube BID  . sodium chloride  3 mL Intravenous Q12H  . sucralfate  1 g Per Tube 4 times per day    Objective: Vital signs in last 24 hours: Temp:  [97.9 F (36.6 C)-103.7 F (39.8 C)] 97.9 F (36.6 C) (12/13 0830) Pulse Rate:  [99-152] 124 (12/13 0900) Resp:  [14-56] 14 (12/13 0900) BP: (90-153)/(21-107) 100/87 mmHg (12/13 0900) SpO2:  [94 %-100 %] 100 % (12/13 0900) FiO2 (%):  [50 %] 50 % (12/13 0900)   General appearance: no distress Resp: diminished breath sounds bilaterally Cardio: tachycardia GI: abnormal findings:  distended and hypoactive bowel sounds  Lab Results  Recent Labs  01/13/15 0400  01/14/15 0200 01/14/15 0900  WBC 6.3  --  9.6  --   HGB 8.0*  --  11.8*  --   HCT 29.7*  --  39.3  --   NA 162*  < >  149* 146*  K 3.8  < > 4.1 3.8  CL >130*  < > 116* 115*  CO2 22  < > 21* 20*  BUN 51*  < > 51* 46*  CREATININE 1.58*  < > 1.75* 1.57*  < > = values in this interval not displayed. Liver Panel  Recent Labs  01/11/2015 1921 01/13/15 0400  PROT 5.2* 3.4*  ALBUMIN 2.1* 1.3*  AST 979* 185*  ALT 414* 178*  ALKPHOS 1198* 741*  BILITOT 0.6 0.9   Sedimentation Rate No results for input(s): ESRSEDRATE in the last 72 hours. C-Reactive Protein No results for input(s): CRP in the last 72 hours.  Microbiology: Recent Results (from the past 240 hour(s))  C difficile quick scan w PCR reflex     Status: None   Collection Time: 01/04/15  4:01 PM  Result Value Ref Range Status   C Diff antigen NEGATIVE NEGATIVE Final   C Diff toxin NEGATIVE NEGATIVE Final   C Diff interpretation Negative for toxigenic C. difficile  Final  Culture, blood (routine x 2)     Status: None (Preliminary result)   Collection Time: 01/13/2015  2:21 PM    Result Value Ref Range Status   Specimen Description PORTA CATH A-LINE  Final   Special Requests BOTTLES DRAWN AEROBIC AND ANAEROBIC 5ML  Final   Culture NO GROWTH < 24 HOURS  Final   Report Status PENDING  Incomplete  Culture, blood (routine x 2)     Status: None (Preliminary result)   Collection Time: 01/23/2015  7:30 PM  Result Value Ref Range Status   Specimen Description BLOOD LEFT HAND  Final   Special Requests PEDIATRICS 2CC  Final   Culture NO GROWTH < 24 HOURS  Final   Report Status PENDING  Incomplete  Urine culture     Status: None   Collection Time: 01/15/2015  7:44 PM  Result Value Ref Range Status   Specimen Description URINE, RANDOM  Final   Special Requests PEDS BAG  Final   Culture 4,000 COLONIES/mL INSIGNIFICANT GROWTH  Final   Report Status 01/13/2015 FINAL  Final  C difficile quick scan w PCR reflex     Status: None   Collection Time: 01/13/15 12:45 AM  Result Value Ref Range Status   C Diff antigen NEGATIVE NEGATIVE Final   C Diff toxin NEGATIVE NEGATIVE Final   C Diff interpretation Negative for toxigenic C. difficile  Final    Studies/Results: US Abdomen Complete  01/13/2015  CLINICAL DATA:  Acute onset of abdominal distention and sepsis. Initial encounter. EXAM: ULTRASOUND ABDOMEN COMPLETE COMPARISON:  Right upper quadrant ultrasound performed 01/05/2015, and CT of the abdomen and pelvis performed 10/15/2014 FINDINGS: Gallbladder: Stones are noted dependently in the gallbladder. The gallbladder wall is borderline prominent. No pericholecystic fluid is seen. Evaluation for ultrasonographic Murphy's sign is not possible as the patient is unconscious. Common bile duct: Diameter: 0.4 cm, within normal limits in caliber. Liver: No focal lesion identified. Within normal limits in parenchymal echogenicity. Difficult to fully assess due to the midline pain pump. IVC: No abnormality visualized. Pancreas: Not visualized. Spleen: Size and appearance within normal limits.  Right Kidney: Length: 9.4 cm. Not fully assessed due to overlying bowel gas. Echogenicity within normal limits. No mass or hydronephrosis visualized. Left Kidney: Not fully assessed due to overlying bowel gas. Echogenicity within normal limits. No mass or hydronephrosis visualized. Abdominal aorta: Not visualized due to overlying bowel gas. Other findings: None. IMPRESSION: 1. No acute abnormality seen  within the abdomen. Evaluation is significantly suboptimal due to bowel gas. 2. Cholelithiasis. Borderline prominent gallbladder wall. Gallbladder otherwise grossly unremarkable. Electronically Signed   By: Garald Balding M.D.   On: 01/13/2015 23:07   Dg Chest Portable 1 View  01/27/2015  CLINICAL DATA:  Fever. EXAM: PORTABLE CHEST 1 VIEW COMPARISON:  December 3rd 2016. FINDINGS: Stable chronic low lung volumes are noted. Stable surgical fusion of thoraco lumbar spine is noted. No pneumothorax is noted. Increased bibasilar linear densities are noted most consistent with worsening subsegmental atelectasis or possibly pneumonia. Cardiomediastinal silhouette is unchanged. IMPRESSION: Stable chronic low lung volumes are noted. Increased bibasilar linear densities are noted most consistent with worsening subsegmental atelectasis or possibly pneumonia. Electronically Signed   By: Marijo Conception, M.D.   On: 01/16/2015 20:01   Assessment/Plan: Zyvox induced thrombocytopenia Would check f/u CBC  ARF Hypernatremia random vanco level <4 which suggests improvement in kidney function Na near normal by yesterday AM Consider repeat Na  Osteomyelitis of sacrum Would aim to continue his current anbx for 28 days  Possible HCAP Sepsis Will continue ceftaroline/flagyl Await BCx. UCx (-)  abd distension  abd u/s shows gas, cholelithiasis (known prev)  Anemia Seen by h/o  Hepatitis LFTs have improved, possibly due to dehydration, sepsis  Appreciate Palliative Care eval of pt (family not  receptive?)  Total days of antibiotics: 14 (1 ceftaroline/flagyl)         [name] Infectious Diseases (pager) [phone] [email or web address] 01/14/2015, 10:53 AM  LOS: 2 days

## 2015-01-14 NOTE — Progress Notes (Signed)
Hypoglycemic Event  CBG: 47  Treatment: D50 IV 25 mL  Symptoms: Pale  Follow-up CBG: Time: 2027 CBG Result: 107  Possible Reasons for Event: Unknown  Comments/MD notified: Yes, no new orders received.    Sherlie Ban

## 2015-01-14 NOTE — Progress Notes (Signed)
Pt's caretakers (mother and father) changed his stage IV sacral wound dressing. Wound clean, with minimal drainage, unattached edges and approximately an 1.5in in depth.  New rectal probe placed. Pt still continues to have a fever, tylenol given, ice packs applied, and cooling blanket applied underneath patient, MD aware. Will continue to monitor.

## 2015-01-14 NOTE — Progress Notes (Signed)
NT suctioned pt through R nare per Fathers request with no sputum obtained. Pt had several large pieces of dried mucous sitting on tongue and in the back of the throat which were removed with use of Yankauer. Father informed of dried mucous removed and that if pt continues to have increased secretions with rattle sound pt may need to avoid BIPAP at night as it dries them out more and can cause mucous plugging. Pt Father stated that something else will have to be figured out to remove secretions if Yankauer and 42F Catheter does not work as Pt has to wear Bipap. Pt resting very comfortably on 4L [place] with spo2 100%. RN aware. RT will continue to monitor.

## 2015-01-15 ENCOUNTER — Encounter: Payer: Self-pay | Admitting: Hematology & Oncology

## 2015-01-15 ENCOUNTER — Inpatient Hospital Stay (HOSPITAL_COMMUNITY): Payer: Medicaid Other

## 2015-01-15 ENCOUNTER — Telehealth: Payer: Self-pay | Admitting: *Deleted

## 2015-01-15 LAB — BASIC METABOLIC PANEL
Anion gap: 10 (ref 5–15)
Anion gap: 9 (ref 5–15)
BUN: 37 mg/dL — ABNORMAL HIGH (ref 6–20)
BUN: 40 mg/dL — ABNORMAL HIGH (ref 6–20)
CALCIUM: 7.8 mg/dL — AB (ref 8.9–10.3)
CHLORIDE: 119 mmol/L — AB (ref 101–111)
CO2: 23 mmol/L (ref 22–32)
CO2: 23 mmol/L (ref 22–32)
CREATININE: 1.48 mg/dL — AB (ref 0.61–1.24)
CREATININE: 1.48 mg/dL — AB (ref 0.61–1.24)
Calcium: 8 mg/dL — ABNORMAL LOW (ref 8.9–10.3)
Chloride: 119 mmol/L — ABNORMAL HIGH (ref 101–111)
GFR calc non Af Amer: >60 mL/min (ref >60)
GFR calc non Af Amer: >60 mL/min (ref >60)
Glucose, Bld: 105 mg/dL — ABNORMAL HIGH (ref 65–99)
Glucose, Bld: 122 mg/dL — ABNORMAL HIGH (ref 65–99)
POTASSIUM: 3 mmol/L — AB (ref 3.5–5.1)
Potassium: 2.6 mmol/L — CL (ref 3.5–5.1)
SODIUM: 151 mmol/L — AB (ref 135–145)
Sodium: 152 mmol/L — ABNORMAL HIGH (ref 135–145)

## 2015-01-15 LAB — GLUCOSE, CAPILLARY
GLUCOSE-CAPILLARY: 102 mg/dL — AB (ref 65–99)
GLUCOSE-CAPILLARY: 65 mg/dL (ref 65–99)
GLUCOSE-CAPILLARY: 70 mg/dL (ref 65–99)
GLUCOSE-CAPILLARY: 74 mg/dL (ref 65–99)
GLUCOSE-CAPILLARY: 92 mg/dL (ref 65–99)
GLUCOSE-CAPILLARY: 96 mg/dL (ref 65–99)
Glucose-Capillary: 86 mg/dL (ref 65–99)
Glucose-Capillary: 135 mg/dL — ABNORMAL HIGH (ref 65–99)
Glucose-Capillary: 101 mg/dL — ABNORMAL HIGH (ref 65–99)

## 2015-01-15 LAB — CBC
HEMATOCRIT: 32.2 % — AB (ref 39.0–52.0)
HEMOGLOBIN: 10.1 g/dL — AB (ref 13.0–17.0)
MCH: 30.8 pg (ref 26.0–34.0)
MCHC: 31.4 g/dL (ref 30.0–36.0)
MCV: 98.2 fL (ref 78.0–100.0)
PLATELETS: 68 10*3/uL — AB (ref 150–400)
RBC: 3.28 MIL/uL — AB (ref 4.22–5.81)
RDW: 21.7 % — ABNORMAL HIGH (ref 11.5–15.5)
WBC: 4.8 10*3/uL (ref 4.0–10.5)

## 2015-01-15 MED ORDER — POTASSIUM CHLORIDE 10 MEQ/50ML IV SOLN
INTRAVENOUS | Status: AC
  Filled 2015-01-15: qty 50

## 2015-01-15 MED ORDER — POTASSIUM CHLORIDE 10 MEQ/50ML IV SOLN
10.0000 meq | INTRAVENOUS | Status: AC
  Administered 2015-01-15 (×4): 10 meq via INTRAVENOUS
  Filled 2015-01-15 (×2): qty 50

## 2015-01-15 MED ORDER — GLYCOPYRROLATE 0.2 MG/ML IJ SOLN
0.1000 mg | Freq: Four times a day (QID) | INTRAMUSCULAR | Status: DC | PRN
  Administered 2015-01-16 (×2): 0.1 mg via INTRAVENOUS
  Filled 2015-01-15 (×4): qty 0.5

## 2015-01-15 MED ORDER — POTASSIUM CHLORIDE 20 MEQ/15ML (10%) PO SOLN
40.0000 meq | Freq: Two times a day (BID) | ORAL | Status: DC
  Administered 2015-01-15: 40 meq
  Filled 2015-01-15: qty 30

## 2015-01-15 MED ORDER — NONFORMULARY OR COMPOUNDED ITEM
2.0000 | Freq: Three times a day (TID) | Status: DC
  Administered 2015-01-15 – 2015-01-17 (×4): 2 via JEJUNOSTOMY
  Administered 2015-01-20: 1 via JEJUNOSTOMY
  Administered 2015-01-20 – 2015-01-24 (×5): 2 via JEJUNOSTOMY
  Filled 2015-01-15: qty 1

## 2015-01-15 MED ORDER — DIPHENHYDRAMINE HCL 50 MG/ML IJ SOLN
25.0000 mg | Freq: Once | INTRAMUSCULAR | Status: AC
  Administered 2015-01-15: 25 mg via INTRAVENOUS
  Filled 2015-01-15: qty 1

## 2015-01-15 MED ORDER — FILGRASTIM 480 MCG/1.6ML IJ SOLN
480.0000 ug | Freq: Once | INTRAMUSCULAR | Status: AC
  Administered 2015-01-15: 480 ug via SUBCUTANEOUS
  Filled 2015-01-15: qty 1.6

## 2015-01-15 MED ORDER — NON FORMULARY: 2.0000 | Freq: Three times a day (TID) | Status: DC

## 2015-01-15 MED ORDER — FREE WATER
200.0000 mL | Freq: Three times a day (TID) | Status: DC
  Administered 2015-01-15 (×2): 200 mL
  Administered 2015-01-15: 100 mL
  Administered 2015-01-16 (×2): 200 mL

## 2015-01-15 MED ORDER — PRO-STAT SUGAR FREE PO LIQD
30.0000 mL | Freq: Three times a day (TID) | ORAL | Status: DC
  Administered 2015-01-15 – 2015-01-24 (×23): 30 mL
  Filled 2015-01-15 (×23): qty 30

## 2015-01-15 MED ORDER — TWOCAL HN PO LIQD
237.0000 mL | ORAL | Status: DC
  Administered 2015-01-15: 25 mL via ORAL
  Administered 2015-01-16: 237 mL via ORAL

## 2015-01-15 MED ORDER — IBUPROFEN 100 MG/5ML PO SUSP
600.0000 mg | Freq: Four times a day (QID) | ORAL | Status: DC | PRN
  Administered 2015-01-15 – 2015-01-21 (×2): 600 mg
  Filled 2015-01-15 (×3): qty 30

## 2015-01-15 NOTE — Progress Notes (Signed)
CRITICAL VALUE ALERT  Critical value received:  K+ 2.6--- blood drawn before 41mEq KCl given this AM.   Date of notification:  01/15/15  Time of notification:  0908  Critical value read back:Yes.    Nurse who received alert:  Pricilla Holm, RN  MD notified (1st page):  [place]  Time of first page:  0910  Responding MD:  Sloan Leiter  Time MD responded:  [id number], give AM PO dose of 9mEq KCl, then give 40mEq KCl IVPB Q1 hour x4; DC PO dosing.

## 2015-01-15 NOTE — Progress Notes (Signed)
NT suctioned in right nare with minimal return, as well as oral suctioning with yankeur.

## 2015-01-15 NOTE — Progress Notes (Signed)
RN took patient off BiPAP and placed on 4 LNC. Tolerating well. RT to monitor.

## 2015-01-15 NOTE — Progress Notes (Signed)
Arrived to place patient on Bipap, however, he had just vomited x1.  RT will return to place patient on BiPap at a later time to limit aspiration risk.

## 2015-01-15 NOTE — Progress Notes (Signed)
Hypoglycemic Event  CBG: 65  Treatment: D50 IV 50 mL  Symptoms: None  Follow-up CBG: Time: 0639 CBG Result: 96  Possible Reasons for Event: Inadequate meal intake and Unknown  Comments/MD notified: Yes, no new orders received at this time.    Sherlie Ban

## 2015-01-15 NOTE — Progress Notes (Signed)
Turned off tube feeds and disconnected patients feeding per patients fathers request.

## 2015-01-15 NOTE — Progress Notes (Signed)
   01/15/15 0900  Clinical Encounter Type  Visited With Family  Visit Type Follow-up;Psychological support;Spiritual support;Social support  Spiritual Encounters  Spiritual Needs Emotional  Stress Factors  Family Stress Factors Loss  Ch met with father to check on pt and pt mother, who is ill; [place] will continue to monitor and follow-up as needed. Gwynn Burly 9:45 AM

## 2015-01-15 NOTE — Progress Notes (Signed)
INFECTIOUS DISEASE PROGRESS NOTE  ID: Jeffery [name] is a 24 y.o. male with  Principal Problem:   Severe sepsis ([place]) Active Problems:   Acute on chronic respiratory failure with hypoxia ([place])   Cerebral palsy, quadriplegic (HCC)   Acquired pancytopenia (H[facility])   Transaminitis   Seizures (HCC)   Pressure ulcer   Osteomyelitis ([place])   Malnutrition ([place])   Hypernatremia   AKI (acute kidney injury) ([place])  Subjective: sleeping  Abtx:  Anti-infectives    Start     Dose/Rate Route Frequency Ordered Stop   01/14/15 1200  cefTAZidime (FORTAZ) 2 g in dextrose 5 % 50 mL IVPB  Status:  Discontinued     2 g 100 mL/hr over 30 Minutes Intravenous Every 24 hours 01/13/15 1344 01/13/15 1553   01/13/15 1800  ceftaroline (TEFLARO) 300 mg in sodium chloride 0.9 % 250 mL IVPB     300 mg 250 mL/hr over 60 Minutes Intravenous Every 12 hours 01/13/15 1558     01/13/15 1700  ceftaroline (TEFLARO) 300 mg in sodium chloride 0.9 % 250 mL IVPB  Status:  Discontinued     300 mg 250 mL/hr over 60 Minutes Intravenous Every 12 hours 01/13/15 1557 01/13/15 1558   01/13/15 1600  metroNIDAZOLE (FLAGYL) IVPB 500 mg     500 mg 100 mL/hr over 60 Minutes Intravenous Every 8 hours 01/13/15 1554     01/13/15 0845  linezolid (ZYVOX) IVPB 600 mg  Status:  Discontinued     600 mg 300 mL/hr over 60 Minutes Intravenous Every 12 hours 01/06/2015 2345 01/07/2015 2350   01/06/2015 2200  linezolid (ZYVOX) IVPB 600 mg  Status:  Discontinued     600 mg 300 mL/hr over 60 Minutes Intravenous Every 12 hours 01/23/2015 2020 01/13/15 1532   01/13/2015 2200  cefTAZidime (FORTAZ) 2 g in dextrose 5 % 50 mL IVPB  Status:  Discontinued     2 g 100 mL/hr over 30 Minutes Intravenous Every 12 hours 01/22/2015 2034 01/13/15 1344      Medications:  Scheduled: . antiseptic oral rinse  7 mL Mouth Rinse q12n4p  . ceFTAROline (TEFLARO) IV  300 mg Intravenous Q12H  . chlorhexidine  15 mL Mouth Rinse BID  . cloBAZam  3.75 mg Per Tube TID AC  .  [facility] Beanaid  2 capsule Per J Tube TID  . diphenhydrAMINE  25 mg Intravenous Once  . feeding supplement (PRO-STAT SUGAR FREE 64)  30 mL Per Tube TID  . filgrastim  480 mcg Subcutaneous Once  . free water  200 mL Per Tube 3 times per day  . gabapentin  900 mg Per Tube 3 times per day  . levalbuterol  0.63 mg Nebulization Q6H  . metoCLOPramide  10 mg Per Tube TID AC & HS  . metronidazole  500 mg Intravenous Q8H  . pantoprazole sodium  40 mg Per Tube BID  . potassium chloride  10 mEq Intravenous Q1 Hr x 4  . potassium chloride      . sodium chloride  3 mL Intravenous Q12H  . sucralfate  1 g Per Tube 4 times per day    Objective: Vital signs in last 24 hours: Temp:  [98.2 F (36.8 C)-99.3 F (37.4 C)] 98.4 F (36.9 C) (12/14 1213) Pulse Rate:  [103-125] 103 (12/14 0800) Resp:  [15-19] 18 (12/14 0800) BP: (102-128)/(65-91) 102/68 mmHg (12/14 0800) SpO2:  [100 %] 100 % (12/14 0817)   General appearance: fatigued and no distress Resp:  rhonchi anterior - bilateral Chest wall: no tenderness, port clean, no fluctuance.  Cardio: regular rate and rhythm GI: abnormal findings:  distended and hypoactive bowel sounds Extremities: edema anasarca  Lab Results  Recent Labs  01/14/15 0200  01/15/15 0015 01/15/15 0810  WBC 9.6  --  4.8  --   HGB 11.8*  --  10.1*  --   HCT 39.3  --  32.2*  --   NA 149*  < > 152* 151*  K 4.1  < > 3.0* 2.6*  CL 116*  < > 119* 119*  CO2 21*  < > 23 23  BUN 51*  < > 40* 37*  CREATININE 1.75*  < > 1.48* 1.48*  < > = values in this interval not displayed. Liver Panel  Recent Labs  01/29/2015 1921 01/13/15 0400  PROT 5.2* 3.4*  ALBUMIN 2.1* 1.3*  AST 979* 185*  ALT 414* 178*  ALKPHOS 1198* 741*  BILITOT 0.6 0.9   Sedimentation Rate No results for input(s): ESRSEDRATE in the last 72 hours. C-Reactive Protein No results for input(s): CRP in the last 72 hours.  Microbiology: Recent Results (from the past 240 hour(s))  Culture, blood  (routine x 2)     Status: None (Preliminary result)   Collection Time: 01/22/2015  2:21 PM  Result Value Ref Range Status   Specimen Description PORTA CATH A-LINE  Final   Special Requests BOTTLES DRAWN AEROBIC AND ANAEROBIC 5ML  Final   Culture NO GROWTH 3 DAYS  Final   Report Status PENDING  Incomplete  Culture, blood (routine x 2)     Status: None (Preliminary result)   Collection Time: 01/02/2015  7:30 PM  Result Value Ref Range Status   Specimen Description BLOOD LEFT HAND  Final   Special Requests PEDIATRICS 2CC  Final   Culture NO GROWTH 3 DAYS  Final   Report Status PENDING  Incomplete  Urine culture     Status: None   Collection Time: 01/17/2015  7:44 PM  Result Value Ref Range Status   Specimen Description URINE, RANDOM  Final   Special Requests PEDS BAG  Final   Culture 4,000 COLONIES/mL INSIGNIFICANT GROWTH  Final   Report Status 01/13/2015 FINAL  Final  C difficile quick scan w PCR reflex     Status: None   Collection Time: 01/13/15 12:45 AM  Result Value Ref Range Status   C Diff antigen NEGATIVE NEGATIVE Final   C Diff toxin NEGATIVE NEGATIVE Final   C Diff interpretation Negative for toxigenic C. difficile  Final    Studies/Results: US Abdomen Complete  01/13/2015  CLINICAL DATA:  Acute onset of abdominal distention and sepsis. Initial encounter. EXAM: ULTRASOUND ABDOMEN COMPLETE COMPARISON:  Right upper quadrant ultrasound performed 01/05/2015, and CT of the abdomen and pelvis performed 10/15/2014 FINDINGS: Gallbladder: Stones are noted dependently in the gallbladder. The gallbladder wall is borderline prominent. No pericholecystic fluid is seen. Evaluation for ultrasonographic Murphy's sign is not possible as the patient is unconscious. Common bile duct: Diameter: 0.4 cm, within normal limits in caliber. Liver: No focal lesion identified. Within normal limits in parenchymal echogenicity. Difficult to fully assess due to the midline pain pump. IVC: No abnormality  visualized. Pancreas: Not visualized. Spleen: Size and appearance within normal limits. Right Kidney: Length: 9.4 cm. Not fully assessed due to overlying bowel gas. Echogenicity within normal limits. No mass or hydronephrosis visualized. Left Kidney: Not fully assessed due to overlying bowel gas. Echogenicity within normal limits. No mass  or hydronephrosis visualized. Abdominal aorta: Not visualized due to overlying bowel gas. Other findings: None. IMPRESSION: 1. No acute abnormality seen within the abdomen. Evaluation is significantly suboptimal due to bowel gas. 2. Cholelithiasis. Borderline prominent gallbladder wall. Gallbladder otherwise grossly unremarkable. Electronically Signed   By: Garald Balding M.D.   On: 01/13/2015 23:07   Dg Chest Port 1 View  01/15/2015  CLINICAL DATA:  Pneumonia. EXAM: PORTABLE CHEST 1 VIEW COMPARISON:  01/06/2015 FINDINGS: Chronic low lung volumes. Increased densities at the left lung base are concerning for worsening atelectasis or infection. Stable appearance of the heart and mediastinum. Port-A-Cath in the SVC region. Stable appearance of the spinal surgical hardware. IMPRESSION: Increased densities in the left lower lobe are concerning for worsening atelectasis or infection. Chronic low lung volumes. Electronically Signed   By: Markus Daft M.D.   On: 01/15/2015 13:02   Dg Abd Portable 1v  01/15/2015  CLINICAL DATA:  Abdominal distension. EXAM: PORTABLE ABDOMEN - 1 VIEW COMPARISON:  10/15/2014 FINDINGS: Spinal hardware throughout the thoracolumbar spine extending into the pelvis. Chronic changes involving the pelvic bones and hips. There is a GJ feeding tube and the tip is probably in the jejunal region. Again noted is an implant infusion device in the right lower abdomen. Few gas-filled loops of bowel but nonobstructive pattern. IMPRESSION: No acute abnormality. Electronically Signed   By: Markus Daft M.D.   On: 01/15/2015 13:08     Assessment/Plan:  Zyvox induced  thrombocytopenia Would check f/u CBC  ARF Hypernatremia Na slightly back up, hydration.   Osteomyelitis of sacrum Would aim to continue his current anbx for 28 days  Possible HCAP Sepsis Will continue ceftaroline/flagyl Await BCx. UCx (-)  abd distension  abd u/s shows gas, cholelithiasis (known prev) abd film does not show ileus or obstruction  Anemia Seen by h/o  Hepatitis LFTs have improved, possibly due to dehydration, sepsis  Appreciate Palliative Care eval of pt- spoke with husband, not clear about his understanding or expectations.   Total days of antibiotics: 15 (2 ceftaroline/flagyl)         Jeffery [name] Infectious Diseases (pager) [phone] [email or web address] 01/15/2015, 2:04 PM  LOS: 3 days

## 2015-01-15 NOTE — Progress Notes (Signed)
UR COMPLETED  

## 2015-01-15 NOTE — Progress Notes (Signed)
While turning patient to change pad underneath, patient vomited. With patient sitting upright used oral suction. HR 120's and O2 sats down to 90% with respiratory rate in the 40's-50's. Called respiratory therapist to bedside. Increased O2 to 5L nasal cannula and RT deep suctioned patient via NTS. Vitals now: HR-120 bp- 122/65 RR-30 O2 sats- 96% RT still at bedside. Will continue to monitor.

## 2015-01-15 NOTE — Progress Notes (Signed)
NT suctioned pt per father request going down right nare, minimal secretions retured. Pt tolerated procedure well and is now resting comfortably. RT will continue to monitor

## 2015-01-15 NOTE — Progress Notes (Addendum)
Nutrition Follow-up  DOCUMENTATION CODES:   Not applicable  INTERVENTION:    Resume TF via J-tube with TwoCal HN at 25 ml/h today. Use home supply of TwoCal HN. Increase to 46 ml/h x 3 cans per day as tolerated.  Give Prostat 30 ml TID via J-tube.  Total intake on home schedule is 1725 kcals, 105 gm protein.  Use Gatorade for free water flushes (200 ml TID), adjust to 180 ml 8 times per day when regular dose feedings resumed.   Dad would like to resume beanaid with feedings to reduce gas.   NUTRITION DIAGNOSIS:   Inadequate oral intake related to chronic illness, inability to eat as evidenced by NPO status.  Ongoing  GOAL:   Patient will meet greater than or equal to 90% of their needs  Progressing  MONITOR:   Labs, Weight trends, TF tolerance, Skin  REASON FOR ASSESSMENT:   Consult Enteral/tube feeding initiation and management  ASSESSMENT:   24 y.o. 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 Admitted for severe sepsis and hypernatremia.Plans for fluid resuscitation and broad spectrum antibitoics.   Spoke with Dad, who agrees to resume TF at ~1/2 of usual rate today. Discussed patient with RN. Received MD Consult for TF initiation and management. Spoke with Pharmacy to enter TF orders; will start today with TwoCal HN at 25 ml/h. Can increase to 46 ml/h x 3 cans per home regimen as tolerated. Using Gatorade for free water flushes.  Home TF regimen is 3 cans of TwoCal HN per day via J-tube with Prostat 40 ml BID. Rate of TF varies depending on whether he is in the bed or sitting up. She uses Gatorade to flush the tube instead of water, 180 ml 8 times per day.  Diet Order:   NPO  Skin:  Wound (see comment) (stage IV to buttocks, stage II to  buttocks)  Last BM:  12/13  Height:   Ht Readings from Last 1 Encounters:  01/16/2015 4\' 11"  (1.499 m)    Weight:   Wt Readings from Last 1 Encounters:  01/14/2015 129 lb 10.1 oz (58.8 kg)   BMI:  Body mass index is 26.17 kg/(m^2).  Estimated Nutritional Needs:   Kcal:  1400-1600  Protein:  75-95 gm  Fluid:  >/= 1.5 L  EDUCATION NEEDS:   No education needs identified at this time  Molli Barrows, [place], [place], [place] Pager [phone] After Hours Pager [phone]

## 2015-01-15 NOTE — Progress Notes (Signed)
RT called by RN to assess decline in patient condition.  Patient found with RR 40-45 and SpO2 92%.  Audible coarse rales present with accessory muscle use.  Patient NT suctioned x2 with large amount of frothy white secretions removed.  RR post procedure 28-32 with increase in SpO2.

## 2015-01-15 NOTE — Telephone Encounter (Signed)
[name] calling office to inquire as to why [name] has not been getting Neupogen daily while in the hospital. Reviewed labs with [name] and his WBC has been greater than 3.0. [name] states that he will prescribe neupogen for when his white count falls below 3.  [name] is very unhappy with this. She states he only has a 4.8 white count  Because he is septic and that his body cannot fight the infection without neupogen. She states the 4.8 is not a true number. Tried to explain to patient that the patient is responding by producing white blood cells, and that [name], the hematologist, would make the best decision regarding the use of neupogen but she continued to state her dis-satisfaction. [name] was informed that [name] would be rounding tomorrow morning around 6am.

## 2015-01-15 NOTE — Progress Notes (Addendum)
PATIENT DETAILS Name: Jeffery [name] Age: 24 y.o. Sex: male Date of Birth: 12-12-1990 Admit Date: 01/23/2015 Admitting Physician Edwin Dada, MD [id number], DAVID C, MD  Subjective:  Sleeping-does open eyes at times. Appears comfortable. Not on BiPAP this morning.  Assessment/Plan: Principal Problem: Severe sepsis: Suspect aspiration pneumonia, but also has ongoing osteomyelitis of the sacrum. Sepsis pathophysiology seems to be slowly improving-fever curve much better. Cultures remain negative, ID following-the recommendations are to continue with ceftaroline/flagyl for 27 more days.  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  Acute on chronic hypoxic respiratory failure: Improved required BiPAP on admission-now weaned off to room air, does have chronic hypoxic respiratory failure at baseline-and requires as needed BiPAP and oxygen at home  Presumed aspiration pneumonia: See above. Has a long-standing issue with pooling of secretions. Continue when necessary Robinul, suctioning etc.  Hypernatremia: Suspect secondary to dehydration from sepsis and no/poor oral intake/tube feeds. Start tube feeds and free water via tube, minimize IV fluids-family concerned regarding third spacing.  Hypokalemia: Likely from [place] yesterday. Replete and recheck.  ARF: Likely prerenal azotemia in the setting of sepsis, slowly improving.  Transaminitis: Significantly improved, has some mild chronic transaminitis and baseline. Mother reports that LFTs worsens after he gets Neupogen. RUQ ultrasound negative for hepatobiliary pathology  Anemia/Leukopenia: Chronic issue-attributed to bone marrow suppression from Keppra and other medications. Oncology/Hematology following.  Diarrhea:? Secondary to antibiotics. C. difficile PCR negative. Supportive care  Sacral  decubitus stage IV: Present prior to [facility] care following.  Dysphagia requring chronic J tube feeds: Continue regular feeds  History of seizures:Continue Onfi and Neurontin.Ensure follow up with Dr Gaynell Face  Hx of cerebral palsy/spastic quadriplegic: Has a intrathecal baclofen pump in place.   [id number] to neurologic issues-on BIPAP Qhs  Palliative care:DNR in place-unfortunate 24 year old with cerebral palsy and spastic quadriplegia-history of seizures-now with recurrent admissions for sepsis from either aspiration pneumonia or ongoing sacral osteomyelitis. Family aware that not a candidate for further escalation of care-goal is to try and manage this situation with antibiotics as much as possible.  Disposition: Remain inpatient  Antimicrobial agents  See below  Anti-infectives    Start     Dose/Rate Route Frequency Ordered Stop   01/14/15 1200  cefTAZidime (FORTAZ) 2 g in dextrose 5 % 50 mL IVPB  Status:  Discontinued     2 g 100 mL/hr over 30 Minutes Intravenous Every 24 hours 01/13/15 1344 01/13/15 1553   01/13/15 1800  ceftaroline (TEFLARO) 300 mg in sodium chloride 0.9 % 250 mL IVPB     300 mg 250 mL/hr over 60 Minutes Intravenous Every 12 hours 01/13/15 1558     01/13/15 1700  ceftaroline (TEFLARO) 300 mg in sodium chloride 0.9 % 250 mL IVPB  Status:  Discontinued     300 mg 250 mL/hr over 60 Minutes Intravenous Every 12 hours 01/13/15 1557 01/13/15 1558   01/13/15 1600  metroNIDAZOLE (FLAGYL) IVPB 500 mg     500 mg 100 mL/hr over 60 Minutes Intravenous Every 8 hours 01/13/15 1554     01/13/15 0845  linezolid (ZYVOX) IVPB 600 mg  Status:  Discontinued     600 mg 300 mL/hr over 60 Minutes Intravenous Every 12 hours 01/17/2015 2345 01/02/2015 2350   01/18/2015 2200  linezolid (ZYVOX) IVPB 600 mg  Status:  Discontinued  600 mg 300 mL/hr over 60 Minutes Intravenous Every 12 hours 01/26/2015 2020 01/13/15 1532   01/29/2015 2200  cefTAZidime (FORTAZ) 2 g in dextrose 5 %  50 mL IVPB  Status:  Discontinued     2 g 100 mL/hr over 30 Minutes Intravenous Every 12 hours 01/13/2015 2034 01/13/15 1344      DVT Prophylaxis: SCD's  Code Status:  DNR  Family Communication Mother-[name] over the phone Father at bedside  Procedures: None  CONSULTS:  pulmonary/intensive care, ID and hematology/oncology  Time spent 40 minutes-Greater than 50% of this time was spent in counseling, explanation of diagnosis, planning of further management, and coordination of care.  MEDICATIONS: Scheduled Meds: . antiseptic oral rinse  7 mL Mouth Rinse q12n4p  . ceFTAROline (TEFLARO) IV  300 mg Intravenous Q12H  . chlorhexidine  15 mL Mouth Rinse BID  . cloBAZam  3.75 mg Per Tube TID AC  . [facility] Beanaid  2 capsule Per J Tube TID  . feeding supplement (PRO-STAT SUGAR FREE 64)  30 mL Per Tube TID  . free water  200 mL Per Tube 3 times per day  . gabapentin  900 mg Per Tube 3 times per day  . levalbuterol  0.63 mg Nebulization Q6H  . metoCLOPramide  10 mg Per Tube TID AC & HS  . metronidazole  500 mg Intravenous Q8H  . pantoprazole sodium  40 mg Per Tube BID  . potassium chloride  40 mEq Per Tube BID  . sodium chloride  3 mL Intravenous Q12H  . sucralfate  1 g Per Tube 4 times per day   Continuous Infusions: . baclofen    . dextrose 100 mL/hr at 01/15/15 0744  . TWOCAL HN     PRN Meds:.acetaminophen (TYLENOL) oral liquid 160 mg/5 mL **OR** acetaminophen, dextrose, dicyclomine, glycopyrrolate, ibuprofen, ketoconazole, levalbuterol, zinc oxide    PHYSICAL EXAM: Vital signs in last 24 hours: Filed Vitals:   01/15/15 0327 01/15/15 0400 01/15/15 0817 01/15/15 0834  BP:  110/67    Pulse:  110    Temp:  98.7 F (37.1 C)  98.2 F (36.8 C)  TempSrc: Rectal Rectal  Rectal  Resp:  18    Height:      Weight:      SpO2:  100% 100%     Weight change:  Filed Weights   01/14/2015 2341  Weight: 58.8 kg (129 lb 10.1 oz)   Body mass index is 26.17 kg/(m^2).  Gen  Exam: Sleeping comfortably Neck: Supple Chest: B/L Clear anteriorly (just finished a bronchodilator treatment) CVS: S1 S2 Regular, no murmurs.  Abdomen: soft, BS +, non tender, non distended. G-tube in place Extremities: no edema, lower extremities warm to touch. Neurologic: Decreased bulk-0 X 5 in all 4 ext   Intake/Output from previous day:  Intake/Output Summary (Last 24 hours) at 01/15/15 1159 Last data filed at 01/15/15 0400  Gross per 24 hour  Intake 2323.33 ml  Output   1750 ml  Net 573.33 ml     LAB RESULTS: CBC  Recent Labs Lab 01/10/2015 1921  01/07/2015 2057 01/13/15 0150 01/13/15 0400 01/14/15 0200 01/15/15 0015  WBC 5.9  --   --  6.0 6.3 9.6 4.8  HGB 11.7*  < > 10.5* 9.1* 8.0* 11.8* 10.1*  HCT 41.5  < > 31.0* 33.5* 29.7* 39.3 32.2*  PLT 219  --   --  111* 98* 86* 68*  MCV 115.6*  --   --  117.1* 117.4* 100.8*  98.2  MCH 32.6  --   --  31.8 31.6 30.3 30.8  MCHC 28.2*  --   --  27.2* 26.9* 30.0 31.4  RDW 21.4*  --   --  20.0* 20.0* 23.0* 21.7*  LYMPHSABS 0.5*  --   --   --   --   --   --   MONOABS 0.4  --   --   --   --   --   --   EOSABS 0.1  --   --   --   --   --   --   BASOSABS 0.1  --   --   --   --   --   --   < > = values in this interval not displayed.  Chemistries   Recent Labs Lab 01/13/15 0150  01/13/15 1740 01/14/15 0200 01/14/15 0900 01/15/15 0015 01/15/15 0810  NA  --   < > 150* 149* 146* 152* 151*  K  --   < > 4.1 4.1 3.8 3.0* 2.6*  CL  --   < > 120* 116* 115* 119* 119*  CO2  --   < > 20* 21* 20* 23 23  GLUCOSE  --   < > 161* 116* 95 105* 122*  BUN  --   < > 52* 51* 46* 40* 37*  CREATININE 1.66*  < > 1.83* 1.75* 1.57* 1.48* 1.48*  CALCIUM  --   < > 7.1* 7.4* 7.4* 8.0* 7.8*  MG 1.8  --   --   --   --   --   --   < > = values in this interval not displayed.  CBG:  Recent Labs Lab 01/15/15 0250 01/15/15 0423 01/15/15 0618 01/15/15 0639 01/15/15 0833  GLUCAP 74 70 65 96 102*    GFR Estimated Creatinine Clearance: 56.7  mL/min (by C-G formula based on Cr of 1.48).  Coagulation profile No results for input(s): INR, PROTIME in the last 168 hours.  Cardiac Enzymes No results for input(s): CKMB, TROPONINI, MYOGLOBIN in the last 168 hours.  Invalid input(s): CK  Invalid input(s): POCBNP No results for input(s): DDIMER in the last 72 hours. No results for input(s): HGBA1C in the last 72 hours. No results for input(s): CHOL, HDL, LDLCALC, TRIG, CHOLHDL, LDLDIRECT in the last 72 hours. No results for input(s): TSH, T4TOTAL, T3FREE, THYROIDAB in the last 72 hours.  Invalid input(s): FREET3 No results for input(s): VITAMINB12, FOLATE, FERRITIN, TIBC, IRON, RETICCTPCT in the last 72 hours. No results for input(s): LIPASE, AMYLASE in the last 72 hours.  Urine Studies No results for input(s): UHGB, CRYS in the last 72 hours.  Invalid input(s): UACOL, UAPR, USPG, UPH, UTP, UGL, UKET, UBIL, UNIT, UROB, ULEU, UEPI, UWBC, URBC, UBAC, CAST, UCOM, BILUA  MICROBIOLOGY: Recent Results (from the past 240 hour(s))  Culture, blood (routine x 2)     Status: None (Preliminary result)   Collection Time: 01/20/2015  2:21 PM  Result Value Ref Range Status   Specimen Description PORTA CATH A-LINE  Final   Special Requests BOTTLES DRAWN AEROBIC AND ANAEROBIC 5ML  Final   Culture NO GROWTH 3 DAYS  Final   Report Status PENDING  Incomplete  Culture, blood (routine x 2)     Status: None (Preliminary result)   Collection Time: 01/07/2015  7:30 PM  Result Value Ref Range Status   Specimen Description BLOOD LEFT HAND  Final   Special Requests PEDIATRICS [facility]  Final   Culture  NO GROWTH 3 DAYS  Final   Report Status PENDING  Incomplete  Urine culture     Status: None   Collection Time: 01/18/2015  7:44 PM  Result Value Ref Range Status   Specimen Description URINE, RANDOM  Final   Special Requests PEDS BAG  Final   Culture 4,000 COLONIES/mL INSIGNIFICANT GROWTH  Final   Report Status 01/13/2015 FINAL  Final  C difficile quick  scan w PCR reflex     Status: None   Collection Time: 01/13/15 12:45 AM  Result Value Ref Range Status   C Diff antigen NEGATIVE NEGATIVE Final   C Diff toxin NEGATIVE NEGATIVE Final   C Diff interpretation Negative for toxigenic C. difficile  Final    RADIOLOGY STUDIES/RESULTS: Ct Pelvis Wo Contrast  01/01/2015  CLINICAL DATA:  Cerebral palsy.  Clinical concern for osteomyelitis. EXAM: CT PELVIS WITHOUT CONTRAST TECHNIQUE: Multidetector CT imaging of the pelvis was performed following the standard protocol without intravenous contrast. COMPARISON:  10/15/2014 CT abdomen/pelvis. FINDINGS: Reproductive: Normal prostate and seminal vesicles. Bladder: Collapsed and grossly normal urinary bladder. Visualized ureters are normal caliber. Bowel: Visualized small and large bowel are normal caliber with no bowel wall thickening. Oral contrast traverses to the distal rectum. Partially visualized is an enteric tube in the distal duodenum and proximal jejunum, with the tip not seen on this study. Vascular/Lymphatic: No pathologically enlarged lymph nodes in the pelvis. Other: No pneumoperitoneum, ascites or focal fluid collection. Musculoskeletal: There is curvilinear gas, fat stranding and ill-defined fluid in the posterior medial left gluteal subcutaneous soft tissues (series 4/ image 125), suggestive of an incompletely visualized decubitus ulcer, with extension of the fat stranding and fluid to the inferior margin of the left ischium. There is mild focal cortical erosion at the inferior left ischium with associated periosteal reaction, in keeping with osteomyelitis. No presacral fluid or fat stranding. No evidence of a sacral decubitus ulcer. Subcutaneous ventral right lower quadrant spinal stimulator device is noted with lead coursing posteriorly and entering the lumbar spinal canal, with the tip not visualized on this study. No aggressive appearing focal osseous lesions. There is stable bilateral congenital hip  dysplasia with shallow acetabula and stable chronic superolateral hip dislocation bilaterally, with stable chronic small underdeveloped femoral heads bilaterally. There is stable diffuse osteopenia. No osseous fracture. No additional focal cortical erosions or periosteal reaction. Partially visualized is bilateral posterior spinal fusion hardware with posterior bilateral fusion rods and interlocking screws extending into the bilateral iliac wings, with no evidence of hardware fracture or loosening. IMPRESSION: 1. Osteomyelitis of the inferior left ischium associated with curvilinear fat stranding, fluid and gas in the posteromedial left gluteal subcutaneous soft tissues, presumably due to a medial left gluteal decubitus ulcer (which is incompletely visualized on this study). No focal drainable fluid collection. 2. No pelvic ascites or intraperitoneal fluid collection. No presacral fluid or inflammatory change. 3. 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 Electronically Signed   By: Ilona Sorrel M.D.   On: 01/01/2015 10:34   US Abdomen Complete  01/13/2015  CLINICAL DATA:  Acute onset of abdominal distention and sepsis. Initial encounter. EXAM: ULTRASOUND ABDOMEN COMPLETE COMPARISON:  Right upper quadrant ultrasound performed 01/05/2015, and CT of the abdomen and pelvis performed 10/15/2014 FINDINGS: Gallbladder: Stones are noted dependently in the gallbladder. The gallbladder wall is borderline prominent. No pericholecystic fluid is seen. Evaluation for ultrasonographic Murphy's sign is not possible as the patient is unconscious. Common bile duct: Diameter: 0.4 cm, within normal  limits in caliber. Liver: No focal lesion identified. Within normal limits in parenchymal echogenicity. Difficult to fully assess due to the midline pain pump. IVC: No abnormality visualized. Pancreas: Not visualized. Spleen: Size and appearance within normal limits. Right Kidney: Length: 9.4 cm. Not fully  assessed due to overlying bowel gas. Echogenicity within normal limits. No mass or hydronephrosis visualized. Left Kidney: Not fully assessed due to overlying bowel gas. Echogenicity within normal limits. No mass or hydronephrosis visualized. Abdominal aorta: Not visualized due to overlying bowel gas. Other findings: None. IMPRESSION: 1. No acute abnormality seen within the abdomen. Evaluation is significantly suboptimal due to bowel gas. 2. Cholelithiasis. Borderline prominent gallbladder wall. Gallbladder otherwise grossly unremarkable. Electronically Signed   By: Garald Balding M.D.   On: 01/13/2015 23:07   Ir Replc Duoden/jejuno Tube Percut W/fluoro  01/01/2015  CLINICAL DATA:  Routine jejunostomy tube exchange EXAM: IR REPLACE DUODEN/JEJUNO TUBE PERCUT WITH FLOURO FLUOROSCOPY TIME:  12 seconds MEDICATIONS AND MEDICAL HISTORY: None ANESTHESIA/SEDATION: None CONTRAST:  5 cc Omnipaque 300 PROCEDURE: The procedure, risks, benefits, and alternatives were explained to the patient. Questions regarding the procedure were encouraged and answered. The patient understands and consents to the procedure. The abdomen was prepped with Betadine in a sterile fashion, and a sterile drape was applied covering the operative field. A sterile gown and sterile gloves were used for the procedure. The existing gastrojejunostomy tube was removed over a stiff glidewire and exchange for a new make jejunostomy tube. The tip was positioned in the proximal jejunum. Contrast was injected. 10 cc saline was utilized to insufflate the balloon. FINDINGS: The jejunostomy tube is been exchange. Tip is in the proximal jejunum. COMPLICATIONS: None IMPRESSION: Successful jejunostomy tube exchange. Electronically Signed   By: Marybelle Killings M.D.   On: 01/01/2015 12:18   Dg Chest Portable 1 View  01/21/2015  CLINICAL DATA:  Fever. EXAM: PORTABLE CHEST 1 VIEW COMPARISON:  December 3rd 2016. FINDINGS: Stable chronic low lung volumes are noted.  Stable surgical fusion of thoraco lumbar spine is noted. No pneumothorax is noted. Increased bibasilar linear densities are noted most consistent with worsening subsegmental atelectasis or possibly pneumonia. Cardiomediastinal silhouette is unchanged. IMPRESSION: Stable chronic low lung volumes are noted. Increased bibasilar linear densities are noted most consistent with worsening subsegmental atelectasis or possibly pneumonia. Electronically Signed   By: Marijo Conception, M.D.   On: 01/22/2015 20:01   Dg Chest Port 1 View  01/04/2015  CLINICAL DATA:  Shortness of breath and fever.  Recent pneumonia. EXAM: PORTABLE CHEST 1 VIEW COMPARISON:  01/01/2015 and 12/31/2014. FINDINGS: 0814 hours. There are chronic low lung volumes. The patient's mandible overlies the right lung apex. Chronic bibasilar atelectasis or scarring is unchanged. There is no airspace disease, edema or pleural effusion. Right subclavian Port-A-Cath appears unchanged. The bones appear unchanged status post thoracolumbar fusion. IMPRESSION: No significant change in chronic bibasilar atelectasis or scarring. No acute findings demonstrated. Electronically Signed   By: Richardean Sale M.D.   On: 01/04/2015 10:29   Portable Chest 1 View  01/01/2015  CLINICAL DATA:  Shortness of breath, cough, dyspnea.  Cerebral palsy EXAM: PORTABLE CHEST 1 VIEW COMPARISON:  Yesterday FINDINGS: Porta catheter on the right with tip in good position at the SVC level. Persistent hypoventilation and linear opacities at the bases. There is no edema, consolidation, effusion, or pneumothorax. Normal heart size and mediastinal contours. Extensive spinal fixation. IMPRESSION: Chronic hypoventilation with basilar atelectasis or scarring. Electronically Signed   By: Monte Fantasia  M.D.   On: 01/01/2015 07:40   Dg Chest Port 1 View  12/31/2014  CLINICAL DATA:  Chest pain recent pneumonia persistent fever for 1 week EXAM: PORTABLE CHEST 1 VIEW COMPARISON:  10/19/2014  FINDINGS: Spinal rods noted. Port-A-Cath stable on the right. Low lung volumes with bibasilar atelectasis. Heart size normal. IMPRESSION: Mild bilateral lower lobe atelectasis. No definite evidence of pneumonia. Subtle infiltrate could be due obscured by the bilateral lower lobe atelectasis however. Electronically Signed   By: Skipper Cliche M.D.   On: 12/31/2014 17:39   US Abdomen Limited Ruq  01/05/2015  CLINICAL DATA:  Elevated liver function tests. Cerebral palsy. Feeding tube and implanted pain pump in the right upper quadrant of the abdomen overlying the gallbladder area. EXAM: US ABDOMEN LIMITED - RIGHT UPPER QUADRANT COMPARISON:  Limited abdomen ultrasound dated 11/06/2014. Abdomen CT dated 10/15/2014. FINDINGS: Gallbladder: Multiple gallstones in the gallbladder measuring up to 7 mm in maximum diameter each. No gallbladder wall thickening or pericholecystic fluid. No apparent pain over the gallbladder. Common bile duct: Diameter: 3.8 mm Liver: Normal echogenicity.  No mass or biliary ductal dilatation seen. IMPRESSION: 1. Cholelithiasis without evidence cholecystitis. 2. Otherwise, unremarkable examination. Electronically Signed   By: Claudie Revering M.D.   On: 01/05/2015 15:59    Oren Binet, MD  [facility] Pager:[phone]  If 7PM-7AM, please contact night-coverage [email or web address] Password [email or web address] 01/15/2015, 11:59 AM   LOS: 3 days

## 2015-01-16 ENCOUNTER — Inpatient Hospital Stay (HOSPITAL_COMMUNITY): Payer: Medicaid Other

## 2015-01-16 LAB — GLUCOSE, CAPILLARY
GLUCOSE-CAPILLARY: 97 mg/dL (ref 65–99)
GLUCOSE-CAPILLARY: 158 mg/dL — AB (ref 65–99)
GLUCOSE-CAPILLARY: 129 mg/dL — AB (ref 65–99)
Glucose-Capillary: 106 mg/dL — ABNORMAL HIGH (ref 65–99)
Glucose-Capillary: 138 mg/dL — ABNORMAL HIGH (ref 65–99)
Glucose-Capillary: 94 mg/dL (ref 65–99)

## 2015-01-16 LAB — COMPREHENSIVE METABOLIC PANEL
ALK PHOS: 378 U/L — AB (ref 38–126)
ALT: 43 U/L (ref 17–63)
ANION GAP: 9 (ref 5–15)
AST: 25 U/L (ref 15–41)
Albumin: 1.4 g/dL — ABNORMAL LOW (ref 3.5–5.0)
BILIRUBIN TOTAL: 0.8 mg/dL (ref 0.3–1.2)
BUN: 31 mg/dL — ABNORMAL HIGH (ref 6–20)
CALCIUM: 8.1 mg/dL — AB (ref 8.9–10.3)
CO2: 22 mmol/L (ref 22–32)
CREATININE: 1.49 mg/dL — AB (ref 0.61–1.24)
Chloride: 124 mmol/L — ABNORMAL HIGH (ref 101–111)
Glucose, Bld: 97 mg/dL (ref 65–99)
Potassium: 2.9 mmol/L — ABNORMAL LOW (ref 3.5–5.1)
Sodium: 155 mmol/L — ABNORMAL HIGH (ref 135–145)
TOTAL PROTEIN: 4 g/dL — AB (ref 6.5–8.1)

## 2015-01-16 LAB — CBC WITH DIFFERENTIAL/PLATELET
BASOS ABS: 0 10*3/uL (ref 0.0–0.1)
Basophils Relative: 0 %
EOS ABS: 0.1 10*3/uL (ref 0.0–0.7)
Eosinophils Relative: 2 %
HCT: 29.9 % — ABNORMAL LOW (ref 39.0–52.0)
HEMOGLOBIN: 8.9 g/dL — AB (ref 13.0–17.0)
LYMPHS PCT: 5 %
Lymphs Abs: 0.4 10*3/uL — ABNORMAL LOW (ref 0.7–4.0)
MCH: 29.6 pg (ref 26.0–34.0)
MCHC: 29.8 g/dL — AB (ref 30.0–36.0)
MCV: 99.3 fL (ref 78.0–100.0)
MONO ABS: 0.5 10*3/uL (ref 0.1–1.0)
Monocytes Relative: 7 %
NEUTROS PCT: 86 %
Neutro Abs: 6 10*3/uL (ref 1.7–7.7)
PLATELETS: 64 10*3/uL — AB (ref 150–400)
RBC: 3.01 MIL/uL — ABNORMAL LOW (ref 4.22–5.81)
RDW: 21.6 % — ABNORMAL HIGH (ref 11.5–15.5)
WBC: 7 10*3/uL (ref 4.0–10.5)

## 2015-01-16 MED ORDER — MORPHINE SULFATE (PF) 2 MG/ML IV SOLN
1.0000 mg | Freq: Once | INTRAVENOUS | Status: AC
  Administered 2015-01-16: 1 mg via INTRAVENOUS

## 2015-01-16 MED ORDER — POTASSIUM CHLORIDE 20 MEQ/15ML (10%) PO SOLN
40.0000 meq | Freq: Once | ORAL | Status: AC
  Administered 2015-01-16: 40 meq
  Filled 2015-01-16: qty 30

## 2015-01-16 MED ORDER — TWOCAL HN PO LIQD
237.0000 mL | ORAL | Status: DC
  Administered 2015-01-16 – 2015-01-23 (×3): 237 mL via ORAL

## 2015-01-16 MED ORDER — MORPHINE SULFATE (PF) 2 MG/ML IV SOLN
INTRAVENOUS | Status: AC
  Filled 2015-01-16: qty 1

## 2015-01-16 MED ORDER — POTASSIUM CHLORIDE 20 MEQ/15ML (10%) PO SOLN: 40.0000 meq | Freq: Once | ORAL | Status: DC

## 2015-01-16 NOTE — Progress Notes (Signed)
INFECTIOUS DISEASE PROGRESS NOTE  ID: Jeffery [name] is a 24 y.o. male with  Principal Problem:   Severe sepsis ([place]) Active Problems:   Acute on chronic respiratory failure with hypoxia ([place])   Cerebral palsy, quadriplegic ([facility])   Acquired pancytopenia ([facility])   Transaminitis   Seizures ([facility])   Pressure ulcer   Osteomyelitis ([place])   Malnutrition ([place])   Hypernatremia   AKI (acute kidney injury) ([place])  Subjective: No response  Abtx:  Anti-infectives    Start     Dose/Rate Route Frequency Ordered Stop   01/14/15 1200  cefTAZidime (FORTAZ) 2 g in dextrose 5 % 50 mL IVPB  Status:  Discontinued     2 g 100 mL/hr over 30 Minutes Intravenous Every 24 hours 01/13/15 1344 01/13/15 1553   01/13/15 1800  ceftaroline (TEFLARO) 300 mg in sodium chloride 0.9 % 250 mL IVPB     300 mg 250 mL/hr over 60 Minutes Intravenous Every 12 hours 01/13/15 1558     01/13/15 1700  ceftaroline (TEFLARO) 300 mg in sodium chloride 0.9 % 250 mL IVPB  Status:  Discontinued     300 mg 250 mL/hr over 60 Minutes Intravenous Every 12 hours 01/13/15 1557 01/13/15 1558   01/13/15 1600  metroNIDAZOLE (FLAGYL) IVPB 500 mg     500 mg 100 mL/hr over 60 Minutes Intravenous Every 8 hours 01/13/15 1554     01/13/15 0845  linezolid (ZYVOX) IVPB 600 mg  Status:  Discontinued     600 mg 300 mL/hr over 60 Minutes Intravenous Every 12 hours 01/18/2015 2345 01/17/2015 2350   01/20/2015 2200  linezolid (ZYVOX) IVPB 600 mg  Status:  Discontinued     600 mg 300 mL/hr over 60 Minutes Intravenous Every 12 hours 02/01/2015 2020 01/13/15 1532   01/11/2015 2200  cefTAZidime (FORTAZ) 2 g in dextrose 5 % 50 mL IVPB  Status:  Discontinued     2 g 100 mL/hr over 30 Minutes Intravenous Every 12 hours 01/08/2015 2034 01/13/15 1344      Medications:  Scheduled: . antiseptic oral rinse  7 mL Mouth Rinse q12n4p  . ceFTAROline (TEFLARO) IV  300 mg Intravenous Q12H  . chlorhexidine  15 mL Mouth Rinse BID  . cloBAZam  3.75 mg Per Tube TID AC    . [facility] Beanaid  2 capsule Per J Tube TID  . feeding supplement (PRO-STAT SUGAR FREE 64)  30 mL Per Tube TID  . free water  200 mL Per Tube 3 times per day  . gabapentin  900 mg Per Tube 3 times per day  . levalbuterol  0.63 mg Nebulization Q6H  . metoCLOPramide  10 mg Per Tube TID AC & HS  . metronidazole  500 mg Intravenous Q8H  . pantoprazole sodium  40 mg Per Tube BID  . sodium chloride  3 mL Intravenous Q12H  . sucralfate  1 g Per Tube 4 times per day    Objective: Vital signs in last 24 hours: Temp:  [98.4 F (36.9 C)-98.8 F (37.1 C)] 98.5 F (36.9 C) (12/15 0901) Pulse Rate:  [47-120] 117 (12/15 0335) Resp:  [17-39] 17 (12/15 0335) BP: (105-122)/(62-70) 113/62 mmHg (12/15 0335) SpO2:  [94 %-100 %] 96 % (12/15 0802) FiO2 (%):  [50 %] 50 % (12/15 0335)   General appearance: alert and no distress Resp: diminished breath sounds bilaterally Chest wall: no tenderness, port site clean Cardio: regular rate and rhythm GI: normal findings: bowel sounds normal and soft,  non-tender and abnormal findings:  distended  Lab Results  Recent Labs  01/15/15 0015 01/15/15 0810 01/16/15 0347  WBC 4.8  --  7.0  HGB 10.1*  --  8.9*  HCT 32.2*  --  29.9*  NA 152* 151* 155*  K 3.0* 2.6* 2.9*  CL 119* 119* 124*  CO2 23 23 22   BUN 40* 37* 31*  CREATININE 1.48* 1.48* 1.49*   Liver Panel  Recent Labs  01/16/15 0347  PROT 4.0*  ALBUMIN 1.4*  AST 25  ALT 43  ALKPHOS 378*  BILITOT 0.8   Sedimentation Rate No results for input(s): ESRSEDRATE in the last 72 hours. C-Reactive Protein No results for input(s): CRP in the last 72 hours.  Microbiology: Recent Results (from the past 240 hour(s))  Culture, blood (routine x 2)     Status: None (Preliminary result)   Collection Time: 01/04/2015  2:21 PM  Result Value Ref Range Status   Specimen Description PORTA CATH A-LINE  Final   Special Requests BOTTLES DRAWN AEROBIC AND ANAEROBIC 5ML  Final   Culture NO GROWTH 3 DAYS   Final   Report Status PENDING  Incomplete  Culture, blood (routine x 2)     Status: None (Preliminary result)   Collection Time: 01/20/2015  7:30 PM  Result Value Ref Range Status   Specimen Description BLOOD LEFT HAND  Final   Special Requests PEDIATRICS 2CC  Final   Culture NO GROWTH 3 DAYS  Final   Report Status PENDING  Incomplete  Urine culture     Status: None   Collection Time: 01/04/2015  7:44 PM  Result Value Ref Range Status   Specimen Description URINE, RANDOM  Final   Special Requests PEDS BAG  Final   Culture 4,000 COLONIES/mL INSIGNIFICANT GROWTH  Final   Report Status 01/13/2015 FINAL  Final  C difficile quick scan w PCR reflex     Status: None   Collection Time: 01/13/15 12:45 AM  Result Value Ref Range Status   C Diff antigen NEGATIVE NEGATIVE Final   C Diff toxin NEGATIVE NEGATIVE Final   C Diff interpretation Negative for toxigenic C. difficile  Final    Studies/Results: Dg Chest Port 1 View  01/15/2015  CLINICAL DATA:  Pneumonia. EXAM: PORTABLE CHEST 1 VIEW COMPARISON:  01/20/2015 FINDINGS: Chronic low lung volumes. Increased densities at the left lung base are concerning for worsening atelectasis or infection. Stable appearance of the heart and mediastinum. Port-A-Cath in the SVC region. Stable appearance of the spinal surgical hardware. IMPRESSION: Increased densities in the left lower lobe are concerning for worsening atelectasis or infection. Chronic low lung volumes. Electronically Signed   By: Markus Daft M.D.   On: 01/15/2015 13:02   Dg Abd Portable 1v  01/15/2015  CLINICAL DATA:  Abdominal distension. EXAM: PORTABLE ABDOMEN - 1 VIEW COMPARISON:  10/15/2014 FINDINGS: Spinal hardware throughout the thoracolumbar spine extending into the pelvis. Chronic changes involving the pelvic bones and hips. There is a GJ feeding tube and the tip is probably in the jejunal region. Again noted is an implant infusion device in the right lower abdomen. Few gas-filled loops of  bowel but nonobstructive pattern. IMPRESSION: No acute abnormality. Electronically Signed   By: Markus Daft M.D.   On: 01/15/2015 13:08     Assessment/Plan: Zyvox induced thrombocytopenia Would check f/u CBC  ARF Hypernatremia Na slightly back up, hydration.   Osteomyelitis of sacrum Would aim to continue his current anbx for 28 days  Possible HCAP  Sepsis Will continue ceftaroline/flagyl Await BCx. UCx (-) Condom cath will not stay on, will order i/o Family asking for sputum cx. He is not coughing (unless laying flat), his SpO2 is normal. His WBC is normal, he is afebrile. Would defer.   abd distension  abd u/s shows gas, cholelithiasis (known prev) abd film does not show ileus or obstruction  Anemia Seen by h/o  Hepatitis LFTs have improved, possibly due to dehydration, sepsis  Appreciate Palliative Care eval of pt- spoke with husband, not clear about his understanding or expectations.   Total days of antibiotics: 16 (3 ceftaroline/flagyl)  Available as needed.          [name] Infectious Diseases (pager) [phone] [email or web address] 01/16/2015, 10:43 AM  LOS: 4 days

## 2015-01-16 NOTE — Progress Notes (Signed)
eLink Pharmacist-Brief Progress Note Patient Name: Jeffery [name] DOB: May 15, 1990 MRN: [id number]   Date of Service  01/16/2015  HPI/Events of Note  Hypokalemia this AM 2.9. No potassium replacement order.  eICU Interventions  K replacement ordered x 1   Attending: Christinia Gully, MD    Carl Best P 01/16/2015, 4:33 PM

## 2015-01-16 NOTE — Progress Notes (Signed)
Patients HR increased to 140's with RR in the 50's pt. In obvious distress upon entering the room.  Tube feeding stopped.  Applied a non re-breather and suctioned patient, pt. tolerated first suction well second attempt at suctioning the patient vomited.  Pt. Repositioned higher in the bed and mouth suctioned.  Administered one dose of Robinul.  Updated Dr. Sloan Leiter on patients change in condition. Contacted respiratory to request further deep suctioning and patient to be placed on bi-pap.  Will continue to monitor.

## 2015-01-16 NOTE — Progress Notes (Signed)
Jeffery [name] actually looks very good this morning. He does not appear to be in any distress. He is not having perspiration. His temperature has come down. I did note the episode of emesis reported by the nurse last night.  He still is on antibiotics. Also far, cultures are negative.  He did get 2 units of blood. His hemoglobin is still dropping. I'm not sure if he is having any problems with leading. His platelet count Is Also Going down a Little Bit. This Might Be from Medications.  He Did Get Neupogen yesterday.  As far as a hotel, does not be any seizure activity.  On his physical exam, there is no change. His lungs to have pretty decent breath sounds bilaterally. He does have some crackles at the bases. Cardiac exam is tachycardic but regular. Abdomen is soft. Bowel sounds are slightly decreased. There is some slight distention. There is no guarding or rebound tenderness.  His blood counts will be watched closely. Again, he responded well to the red cell transfusion. However, his hemoglobin is dropping. Again his bone marrow does tend to get "lazy" in his counts do tend to drop.  I appreciate everybody's help in trying to make his life better.  His sodium is go back up. I think that this is an apparent sign of significant metabolic dysfunction. I will know there is any type of adrenal issue that is causing this. 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 This might be from the osteomyelitis.  We will continue to follow along.   Loann Quill 17:5

## 2015-01-16 NOTE — Progress Notes (Addendum)
PATIENT DETAILS Name: Jeffery [name] Age: 24 y.o. Sex: male Date of Birth: 1990-11-01 Admit Date: 01/06/2015 Admitting Physician Edwin Dada, MD [id number], DAVID C, MD  Subjective: Looks better this morning-awake. Father at bedside.  Assessment/Plan: Principal Problem: Severe sepsis: Suspect aspiration pneumonia, but also has ongoing osteomyelitis of the sacrum. Sepsis pathophysiology seems to be slowly improving-fever curve much better. Cultures remain negative, ID following-the recommendations are to continue with ceftaroline/flagyl for 26 more days.  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  Acute on chronic hypoxic respiratory failure: Improved required BiPAP on admission-now weaned off to room air, does have chronic hypoxic respiratory failure at baseline-and requires as needed BiPAP and oxygen at home  Presumed aspiration pneumonia: See above. Has a long-standing issue with pooling of secretions. Continue when necessary Robinul, suctioning etc.  Hypernatremia: Suspect secondary to dehydration from sepsis and no/poor oral intake/tube feeds. Increase tube feeds to home regimen, free water flushes, increase IVF to 75 cc/hr. Counseled family regarding avoiding lasix in this scenario. Once electrolyte stable will attempt to diurese-as does have some third spacing.   Hypokalemia: Replete and recheck.  ARF: Likely prerenal azotemia in the setting of sepsis, slowly improving.  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 Mother reports that LFTs worsens after he gets Neupogen. RUQ ultrasound negative for hepatobiliary pathology  Anemia/Leukopenia: Chronic issue-attributed to bone marrow suppression from Keppra and other medications. Oncology/Hematology following.  Diarrhea:? Secondary to antibiotics. C.  difficile PCR negative. Supportive care  Sacral decubitus stage IV: Present prior to [facility] care following.  Dysphagia requring chronic J tube feeds: Continue regular tube feeds-is NPO  History of seizures:Continue Onfi and Neurontin.Ensure follow up with Dr Gaynell Face  Hx of cerebral palsy/spastic quadriplegic: Has a intrathecal baclofen pump in place.   [id number] to neurologic issues-on BIPAP Qhs  Palliative care:DNR in place-unfortunate 24 year old with cerebral palsy and spastic quadriplegia-history of seizures-now with recurrent admissions for sepsis from either aspiration pneumonia or ongoing sacral osteomyelitis. Family aware that not a candidate for further escalation of care-goal is to try and manage this situation with antibiotics as much as possible.  Disposition: Remain inpatient-home when lytes are more stable.  Antimicrobial agents  See below  Anti-infectives    Start     Dose/Rate Route Frequency Ordered Stop   01/14/15 1200  cefTAZidime (FORTAZ) 2 g in dextrose 5 % 50 mL IVPB  Status:  Discontinued     2 g 100 mL/hr over 30 Minutes Intravenous Every 24 hours 01/13/15 1344 01/13/15 1553   01/13/15 1800  ceftaroline (TEFLARO) 300 mg in sodium chloride 0.9 % 250 mL IVPB     300 mg 250 mL/hr over 60 Minutes Intravenous Every 12 hours 01/13/15 1558     01/13/15 1700  ceftaroline (TEFLARO) 300 mg in sodium chloride 0.9 % 250 mL IVPB  Status:  Discontinued     300 mg 250 mL/hr over 60 Minutes Intravenous Every 12 hours 01/13/15 1557 01/13/15 1558   01/13/15 1600  metroNIDAZOLE (FLAGYL) IVPB 500 mg     500 mg 100 mL/hr over 60 Minutes Intravenous Every 8 hours 01/13/15 1554     01/13/15 0845  linezolid (ZYVOX) IVPB 600 mg  Status:  Discontinued     600 mg 300 mL/hr over 60 Minutes Intravenous Every 12 hours 01/05/2015 2345 02/01/2015 2350  02/01/2015 2200  linezolid (ZYVOX) IVPB 600 mg  Status:  Discontinued     600 mg 300 mL/hr over 60 Minutes Intravenous Every 12  hours 01/28/2015 2020 01/13/15 1532   01/10/2015 2200  cefTAZidime (FORTAZ) 2 g in dextrose 5 % 50 mL IVPB  Status:  Discontinued     2 g 100 mL/hr over 30 Minutes Intravenous Every 12 hours 02/01/2015 2034 01/13/15 1344      DVT Prophylaxis: SCD's  Code Status:  DNR  Family Communication Father at bedside  Procedures: None  CONSULTS:  pulmonary/intensive care, ID and hematology/oncology  Time spent 3 minutes-Greater than 50% of this time was spent in counseling, explanation of diagnosis, planning of further management, and coordination of care.  MEDICATIONS: Scheduled Meds: . antiseptic oral rinse  7 mL Mouth Rinse q12n4p  . ceFTAROline (TEFLARO) IV  300 mg Intravenous Q12H  . chlorhexidine  15 mL Mouth Rinse BID  . cloBAZam  3.75 mg Per Tube TID AC  . [facility] Beanaid  2 capsule Per J Tube TID  . feeding supplement (PRO-STAT SUGAR FREE 64)  30 mL Per Tube TID  . free water  200 mL Per Tube 3 times per day  . gabapentin  900 mg Per Tube 3 times per day  . levalbuterol  0.63 mg Nebulization Q6H  . metoCLOPramide  10 mg Per Tube TID AC & HS  . metronidazole  500 mg Intravenous Q8H  . pantoprazole sodium  40 mg Per Tube BID  . sodium chloride  3 mL Intravenous Q12H  . sucralfate  1 g Per Tube 4 times per day   Continuous Infusions: . baclofen    . dextrose 75 mL/hr at 01/16/15 1229  . TWOCAL HN 237 mL (01/16/15 1229)   PRN Meds:.acetaminophen (TYLENOL) oral liquid 160 mg/5 mL **OR** acetaminophen, dextrose, dicyclomine, glycopyrrolate, ibuprofen, ketoconazole, levalbuterol, zinc oxide    PHYSICAL EXAM: Vital signs in last 24 hours: Filed Vitals:   01/16/15 0901 01/16/15 0907 01/16/15 1228 01/16/15 1235  BP:  115/64  113/54  Pulse:  121  130  Temp: 98.5 F (36.9 C)  98.9 F (37.2 C)   TempSrc: Rectal  Rectal   Resp:  23  38  Height:      Weight:      SpO2:  92%  90%    Weight change:  Filed Weights   01/19/2015 2341  Weight: 58.8 kg (129 lb 10.1 oz)    Body mass index is 26.17 kg/(m^2).  Gen Exam: awake-not in any distress Neck: Supple Chest: Transmitted upper airway sounds CVS: S1 S2 Regular, no murmurs.  Abdomen: soft, BS +, non tender, non distended. G-tube in place Extremities: no edema, lower extremities warm to touch. Neurologic: Decreased bulk-0 X 5 in all 4 ext   Intake/Output from previous day:  Intake/Output Summary (Last 24 hours) at 01/16/15 1320 Last data filed at 01/16/15 1229  Gross per 24 hour  Intake 4944.5 ml  Output   2050 ml  Net 2894.5 ml     LAB RESULTS: CBC  Recent Labs Lab 01/03/2015 1921  01/13/15 0150 01/13/15 0400 01/14/15 0200 01/15/15 0015 01/16/15 0347  WBC 5.9  --  6.0 6.3 9.6 4.8 7.0  HGB 11.7*  < > 9.1* 8.0* 11.8* 10.1* 8.9*  HCT 41.5  < > 33.5* 29.7* 39.3 32.2* 29.9*  PLT 219  --  111* 98* 86* 68* 64*  MCV 115.6*  --  117.1* 117.4* 100.8* 98.2 99.3  MCH 32.6  --  31.8 31.6 30.3 30.8 29.6  MCHC 28.2*  --  27.2* 26.9* 30.0 31.4 29.8*  RDW 21.4*  --  20.0* 20.0* 23.0* 21.7* 21.6*  LYMPHSABS 0.5*  --   --   --   --   --  0.4*  MONOABS 0.4  --   --   --   --   --  0.5  EOSABS 0.1  --   --   --   --   --  0.1  BASOSABS 0.1  --   --   --   --   --  0.0  < > = values in this interval not displayed.  Chemistries   Recent Labs Lab 01/13/15 0150  01/14/15 0200 01/14/15 0900 01/15/15 0015 01/15/15 0810 01/16/15 0347  NA  --   < > 149* 146* 152* 151* 155*  K  --   < > 4.1 3.8 3.0* 2.6* 2.9*  CL  --   < > 116* 115* 119* 119* 124*  CO2  --   < > 21* 20* 23 23 22   GLUCOSE  --   < > 116* 95 105* 122* 97  BUN  --   < > 51* 46* 40* 37* 31*  CREATININE 1.66*  < > 1.75* 1.57* 1.48* 1.48* 1.49*  CALCIUM  --   < > 7.4* 7.4* 8.0* 7.8* 8.1*  MG 1.8  --   --   --   --   --   --   < > = values in this interval not displayed.  CBG:  Recent Labs Lab 01/15/15 1216 01/15/15 1544 01/15/15 2045 01/15/15 2341 01/16/15 0401  GLUCAP 86 135* 101* 106* 94    GFR Estimated Creatinine  Clearance: 56.3 mL/min (by C-G formula based on Cr of 1.49).  Coagulation profile No results for input(s): INR, PROTIME in the last 168 hours.  Cardiac Enzymes No results for input(s): CKMB, TROPONINI, MYOGLOBIN in the last 168 hours.  Invalid input(s): CK  Invalid input(s): POCBNP No results for input(s): DDIMER in the last 72 hours. No results for input(s): HGBA1C in the last 72 hours. No results for input(s): CHOL, HDL, LDLCALC, TRIG, CHOLHDL, LDLDIRECT in the last 72 hours. No results for input(s): TSH, T4TOTAL, T3FREE, THYROIDAB in the last 72 hours.  Invalid input(s): FREET3 No results for input(s): VITAMINB12, FOLATE, FERRITIN, TIBC, IRON, RETICCTPCT in the last 72 hours. No results for input(s): LIPASE, AMYLASE in the last 72 hours.  Urine Studies No results for input(s): UHGB, CRYS in the last 72 hours.  Invalid input(s): UACOL, UAPR, USPG, UPH, UTP, UGL, UKET, UBIL, UNIT, UROB, ULEU, UEPI, UWBC, URBC, UBAC, CAST, UCOM, BILUA  MICROBIOLOGY: Recent Results (from the past 240 hour(s))  Culture, blood (routine x 2)     Status: None (Preliminary result)   Collection Time: 01/04/2015  2:21 PM  Result Value Ref Range Status   Specimen Description PORTA CATH A-LINE  Final   Special Requests BOTTLES DRAWN AEROBIC AND ANAEROBIC 5ML  Final   Culture NO GROWTH 3 DAYS  Final   Report Status PENDING  Incomplete  Culture, blood (routine x 2)     Status: None (Preliminary result)   Collection Time: 01/27/2015  7:30 PM  Result Value Ref Range Status   Specimen Description BLOOD LEFT HAND  Final   Special Requests PEDIATRICS 2CC  Final   Culture NO GROWTH 3 DAYS  Final   Report Status PENDING  Incomplete  Urine culture     Status:  None   Collection Time: 01/29/2015  7:44 PM  Result Value Ref Range Status   Specimen Description URINE, RANDOM  Final   Special Requests PEDS BAG  Final   Culture 4,000 COLONIES/mL INSIGNIFICANT GROWTH  Final   Report Status 01/13/2015 FINAL  Final  C  difficile quick scan w PCR reflex     Status: None   Collection Time: 01/13/15 12:45 AM  Result Value Ref Range Status   C Diff antigen NEGATIVE NEGATIVE Final   C Diff toxin NEGATIVE NEGATIVE Final   C Diff interpretation Negative for toxigenic C. difficile  Final    RADIOLOGY STUDIES/RESULTS: Ct Pelvis Wo Contrast  01/01/2015  CLINICAL DATA:  Cerebral palsy.  Clinical concern for osteomyelitis. EXAM: CT PELVIS WITHOUT CONTRAST TECHNIQUE: Multidetector CT imaging of the pelvis was performed following the standard protocol without intravenous contrast. COMPARISON:  10/15/2014 CT abdomen/pelvis. FINDINGS: Reproductive: Normal prostate and seminal vesicles. Bladder: Collapsed and grossly normal urinary bladder. Visualized ureters are normal caliber. Bowel: Visualized small and large bowel are normal caliber with no bowel wall thickening. Oral contrast traverses to the distal rectum. Partially visualized is an enteric tube in the distal duodenum and proximal jejunum, with the tip not seen on this study. Vascular/Lymphatic: No pathologically enlarged lymph nodes in the pelvis. Other: No pneumoperitoneum, ascites or focal fluid collection. Musculoskeletal: There is curvilinear gas, fat stranding and ill-defined fluid in the posterior medial left gluteal subcutaneous soft tissues (series 4/ image 125), suggestive of an incompletely visualized decubitus ulcer, with extension of the fat stranding and fluid to the inferior margin of the left ischium. There is mild focal cortical erosion at the inferior left ischium with associated periosteal reaction, in keeping with osteomyelitis. No presacral fluid or fat stranding. No evidence of a sacral decubitus ulcer. Subcutaneous ventral right lower quadrant spinal stimulator device is noted with lead coursing posteriorly and entering the lumbar spinal canal, with the tip not visualized on this study. No aggressive appearing focal osseous lesions. There is stable bilateral  congenital hip dysplasia with shallow acetabula and stable chronic superolateral hip dislocation bilaterally, with stable chronic small underdeveloped femoral heads bilaterally. There is stable diffuse osteopenia. No osseous fracture. No additional focal cortical erosions or periosteal reaction. Partially visualized is bilateral posterior spinal fusion hardware with posterior bilateral fusion rods and interlocking screws extending into the bilateral iliac wings, with no evidence of hardware fracture or loosening. IMPRESSION: 1. Osteomyelitis of the inferior left ischium associated with curvilinear fat stranding, fluid and gas in the posteromedial left gluteal subcutaneous soft tissues, presumably due to a medial left gluteal decubitus ulcer (which is incompletely visualized on this study). No focal drainable fluid collection. 2. No pelvic ascites or intraperitoneal fluid collection. No presacral fluid or inflammatory change. 3. 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 Electronically Signed   By: Ilona Sorrel M.D.   On: 01/01/2015 10:34   US Abdomen Complete  01/13/2015  CLINICAL DATA:  Acute onset of abdominal distention and sepsis. Initial encounter. EXAM: ULTRASOUND ABDOMEN COMPLETE COMPARISON:  Right upper quadrant ultrasound performed 01/05/2015, and CT of the abdomen and pelvis performed 10/15/2014 FINDINGS: Gallbladder: Stones are noted dependently in the gallbladder. The gallbladder wall is borderline prominent. No pericholecystic fluid is seen. Evaluation for ultrasonographic Murphy's sign is not possible as the patient is unconscious. Common bile duct: Diameter: 0.4 cm, within normal limits in caliber. Liver: No focal lesion identified. Within normal limits in parenchymal echogenicity. Difficult to fully assess due to the  midline pain pump. IVC: No abnormality visualized. Pancreas: Not visualized. Spleen: Size and appearance within normal limits. Right Kidney: Length:  9.4 cm. Not fully assessed due to overlying bowel gas. Echogenicity within normal limits. No mass or hydronephrosis visualized. Left Kidney: Not fully assessed due to overlying bowel gas. Echogenicity within normal limits. No mass or hydronephrosis visualized. Abdominal aorta: Not visualized due to overlying bowel gas. Other findings: None. IMPRESSION: 1. No acute abnormality seen within the abdomen. Evaluation is significantly suboptimal due to bowel gas. 2. Cholelithiasis. Borderline prominent gallbladder wall. Gallbladder otherwise grossly unremarkable. Electronically Signed   By: Garald Balding M.D.   On: 01/13/2015 23:07   Ir Replc Duoden/jejuno Tube Percut W/fluoro  01/01/2015  CLINICAL DATA:  Routine jejunostomy tube exchange EXAM: IR REPLACE DUODEN/JEJUNO TUBE PERCUT WITH FLOURO FLUOROSCOPY TIME:  12 seconds MEDICATIONS AND MEDICAL HISTORY: None ANESTHESIA/SEDATION: None CONTRAST:  5 cc Omnipaque 300 PROCEDURE: The procedure, risks, benefits, and alternatives were explained to the patient. Questions regarding the procedure were encouraged and answered. The patient understands and consents to the procedure. The abdomen was prepped with Betadine in a sterile fashion, and a sterile drape was applied covering the operative field. A sterile gown and sterile gloves were used for the procedure. The existing gastrojejunostomy tube was removed over a stiff glidewire and exchange for a new make jejunostomy tube. The tip was positioned in the proximal jejunum. Contrast was injected. 10 cc saline was utilized to insufflate the balloon. FINDINGS: The jejunostomy tube is been exchange. Tip is in the proximal jejunum. COMPLICATIONS: None IMPRESSION: Successful jejunostomy tube exchange. Electronically Signed   By: Marybelle Killings M.D.   On: 01/01/2015 12:18   Dg Chest Port 1 View  01/15/2015  CLINICAL DATA:  Pneumonia. EXAM: PORTABLE CHEST 1 VIEW COMPARISON:  01/15/2015 FINDINGS: Chronic low lung volumes. Increased  densities at the left lung base are concerning for worsening atelectasis or infection. Stable appearance of the heart and mediastinum. Port-A-Cath in the SVC region. Stable appearance of the spinal surgical hardware. IMPRESSION: Increased densities in the left lower lobe are concerning for worsening atelectasis or infection. Chronic low lung volumes. Electronically Signed   By: Markus Daft M.D.   On: 01/15/2015 13:02   Dg Chest Portable 1 View  01/16/2015  CLINICAL DATA:  Fever. EXAM: PORTABLE CHEST 1 VIEW COMPARISON:  December 3rd 2016. FINDINGS: Stable chronic low lung volumes are noted. Stable surgical fusion of thoraco lumbar spine is noted. No pneumothorax is noted. Increased bibasilar linear densities are noted most consistent with worsening subsegmental atelectasis or possibly pneumonia. Cardiomediastinal silhouette is unchanged. IMPRESSION: Stable chronic low lung volumes are noted. Increased bibasilar linear densities are noted most consistent with worsening subsegmental atelectasis or possibly pneumonia. Electronically Signed   By: Marijo Conception, M.D.   On: 01/15/2015 20:01   Dg Chest Port 1 View  01/04/2015  CLINICAL DATA:  Shortness of breath and fever.  Recent pneumonia. EXAM: PORTABLE CHEST 1 VIEW COMPARISON:  01/01/2015 and 12/31/2014. FINDINGS: 0814 hours. There are chronic low lung volumes. The patient's mandible overlies the right lung apex. Chronic bibasilar atelectasis or scarring is unchanged. There is no airspace disease, edema or pleural effusion. Right subclavian Port-A-Cath appears unchanged. The bones appear unchanged status post thoracolumbar fusion. IMPRESSION: No significant change in chronic bibasilar atelectasis or scarring. No acute findings demonstrated. Electronically Signed   By: Richardean Sale M.D.   On: 01/04/2015 10:29   Portable Chest 1 View  01/01/2015  CLINICAL DATA:  Shortness of breath, cough, dyspnea.  Cerebral palsy EXAM: PORTABLE CHEST 1 VIEW COMPARISON:   Yesterday FINDINGS: Porta catheter on the right with tip in good position at the SVC level. Persistent hypoventilation and linear opacities at the bases. There is no edema, consolidation, effusion, or pneumothorax. Normal heart size and mediastinal contours. Extensive spinal fixation. IMPRESSION: Chronic hypoventilation with basilar atelectasis or scarring. Electronically Signed   By: Monte Fantasia M.D.   On: 01/01/2015 07:40   Dg Chest Port 1 View  12/31/2014  CLINICAL DATA:  Chest pain recent pneumonia persistent fever for 1 week EXAM: PORTABLE CHEST 1 VIEW COMPARISON:  10/19/2014 FINDINGS: Spinal rods noted. Port-A-Cath stable on the right. Low lung volumes with bibasilar atelectasis. Heart size normal. IMPRESSION: Mild bilateral lower lobe atelectasis. No definite evidence of pneumonia. Subtle infiltrate could be due obscured by the bilateral lower lobe atelectasis however. Electronically Signed   By: Skipper Cliche M.D.   On: 12/31/2014 17:39   Dg Abd Portable 1v  01/15/2015  CLINICAL DATA:  Abdominal distension. EXAM: PORTABLE ABDOMEN - 1 VIEW COMPARISON:  10/15/2014 FINDINGS: Spinal hardware throughout the thoracolumbar spine extending into the pelvis. Chronic changes involving the pelvic bones and hips. There is a GJ feeding tube and the tip is probably in the jejunal region. Again noted is an implant infusion device in the right lower abdomen. Few gas-filled loops of bowel but nonobstructive pattern. IMPRESSION: No acute abnormality. Electronically Signed   By: Markus Daft M.D.   On: 01/15/2015 13:08   US Abdomen Limited Ruq  01/05/2015  CLINICAL DATA:  Elevated liver function tests. Cerebral palsy. Feeding tube and implanted pain pump in the right upper quadrant of the abdomen overlying the gallbladder area. EXAM: US ABDOMEN LIMITED - RIGHT UPPER QUADRANT COMPARISON:  Limited abdomen ultrasound dated 11/06/2014. Abdomen CT dated 10/15/2014. FINDINGS: Gallbladder: Multiple gallstones in the  gallbladder measuring up to 7 mm in maximum diameter each. No gallbladder wall thickening or pericholecystic fluid. No apparent pain over the gallbladder. Common bile duct: Diameter: 3.8 mm Liver: Normal echogenicity.  No mass or biliary ductal dilatation seen. IMPRESSION: 1. Cholelithiasis without evidence cholecystitis. 2. Otherwise, unremarkable examination. Electronically Signed   By: Claudie Revering M.D.   On: 01/05/2015 15:59    Oren Binet, MD  [facility] Pager:[phone]  If 7PM-7AM, please contact night-coverage [email or web address] Password [email or web address] 01/16/2015, 1:20 PM   LOS: 4 days

## 2015-01-16 NOTE — Progress Notes (Signed)
Per family request I contacted Dr. Sloan Leiter to request a sputum sample as well as an order for a foley catheter.  Pt. Is not currently experiencing urinary retention he does have swelling of the scrotum and Pt's father concerned he will develop retention.  Discussed with physician received no new orders at this time.  Will monitor patient for urinary retention if this occurs will updated MD.

## 2015-01-16 NOTE — Progress Notes (Addendum)
Informed by RN-patient tachypneic and tachycardic after a episode of emesis.  On exam- +distress-tachypeniec to the 40's Lungs:rales and transmitted upper airway sounds diffusely anteriorly  Imp: Aspiration Pneumonitis with Resp Failure  Plan Suction prn Place on Bipap if no improvement Continue IV Abx Hold Tube feeds Spoke with father [name] over the phone and informed him of the above. Explained that patient has poor prognoses and that these episodes are happening very frequently. Father aware that apart from above mentioned measures-really not much more care to escalate. DNR in place.    Addendum 7p: More comfortable with x 1mg  of Morphine Spoke with Mother/Father-both over the phone Both aware of our limitations in this situation

## 2015-01-17 DIAGNOSIS — J189 Pneumonia, unspecified organism: Secondary | ICD-10-CM | POA: Insufficient documentation

## 2015-01-17 DIAGNOSIS — R748 Abnormal levels of other serum enzymes: Secondary | ICD-10-CM

## 2015-01-17 LAB — GLUCOSE, CAPILLARY
Glucose-Capillary: 136 mg/dL — ABNORMAL HIGH (ref 65–99)
Glucose-Capillary: 87 mg/dL (ref 65–99)
Glucose-Capillary: 91 mg/dL (ref 65–99)
Glucose-Capillary: 92 mg/dL (ref 65–99)
Glucose-Capillary: 92 mg/dL (ref 65–99)
Glucose-Capillary: 94 mg/dL (ref 65–99)

## 2015-01-17 LAB — CBC WITH DIFFERENTIAL/PLATELET
BASOS ABS: 0 10*3/uL (ref 0.0–0.1)
Basophils Relative: 0 %
Eosinophils Absolute: 0.2 10*3/uL (ref 0.0–0.7)
Eosinophils Relative: 4 %
HEMATOCRIT: 28.3 % — AB (ref 39.0–52.0)
Hemoglobin: 8.7 g/dL — ABNORMAL LOW (ref 13.0–17.0)
LYMPHS ABS: 0.5 10*3/uL — AB (ref 0.7–4.0)
Lymphocytes Relative: 10 %
MCH: 30.2 pg (ref 26.0–34.0)
MCHC: 30.7 g/dL (ref 30.0–36.0)
MCV: 98.3 fL (ref 78.0–100.0)
MONO ABS: 0.4 10*3/uL (ref 0.1–1.0)
MONOS PCT: 8 %
NEUTROS ABS: 3.5 10*3/uL (ref 1.7–7.7)
Neutrophils Relative %: 78 %
PLATELETS: 74 10*3/uL — AB (ref 150–400)
RBC: 2.88 MIL/uL — ABNORMAL LOW (ref 4.22–5.81)
RDW: 21.5 % — AB (ref 11.5–15.5)
WBC: 4.6 10*3/uL (ref 4.0–10.5)

## 2015-01-17 LAB — URINE CULTURE: CULTURE: NO GROWTH

## 2015-01-17 LAB — BASIC METABOLIC PANEL
Anion gap: 10 (ref 5–15)
BUN: 30 mg/dL — ABNORMAL HIGH (ref 6–20)
CALCIUM: 8.1 mg/dL — AB (ref 8.9–10.3)
CO2: 20 mmol/L — AB (ref 22–32)
CREATININE: 1.47 mg/dL — AB (ref 0.61–1.24)
Chloride: 125 mmol/L — ABNORMAL HIGH (ref 101–111)
GLUCOSE: 107 mg/dL — AB (ref 65–99)
Potassium: 3 mmol/L — ABNORMAL LOW (ref 3.5–5.1)
Sodium: 155 mmol/L — ABNORMAL HIGH (ref 135–145)

## 2015-01-17 LAB — CULTURE, BLOOD (ROUTINE X 2)
CULTURE: NO GROWTH
Culture: NO GROWTH

## 2015-01-17 LAB — MAGNESIUM: Magnesium: 1.6 mg/dL — ABNORMAL LOW (ref 1.7–2.4)

## 2015-01-17 MED ORDER — LOPERAMIDE HCL 1 MG/5ML PO LIQD
2.0000 mg | ORAL | Status: DC | PRN
  Administered 2015-01-17 (×2): 2 mg
  Filled 2015-01-17 (×5): qty 10

## 2015-01-17 MED ORDER — POTASSIUM CHLORIDE 2 MEQ/ML IV SOLN
INTRAVENOUS | Status: DC
  Administered 2015-01-17 – 2015-01-23 (×9): via INTRAVENOUS
  Filled 2015-01-17 (×23): qty 1000

## 2015-01-17 MED ORDER — POTASSIUM CHLORIDE 20 MEQ/15ML (10%) PO SOLN
40.0000 meq | Freq: Once | ORAL | Status: AC
  Administered 2015-01-17: 40 meq
  Filled 2015-01-17: qty 30

## 2015-01-17 MED ORDER — LEVALBUTEROL HCL 0.63 MG/3ML IN NEBU
0.6300 mg | INHALATION_SOLUTION | Freq: Four times a day (QID) | RESPIRATORY_TRACT | Status: DC
  Administered 2015-01-17: 0.63 mg via RESPIRATORY_TRACT
  Filled 2015-01-17 (×2): qty 3

## 2015-01-17 NOTE — Progress Notes (Signed)
   01/17/15 1400  Clinical Encounter Type  Visited With Health care provider  Visit Type Follow-up;Spiritual support;Psychological support;Social support  Spiritual Encounters  Spiritual Needs Other (Comment) (Pt Care concern)  CH met with RN after reviewing pt notes and discussed with RN pt and RN concerns regarding welfare and best care for pt which seem contradictory to family; family plan to withdraw pt from hospital after MD recommendation of comfort care; [facility] consulted and suggests that circumstance may warrant ethics consult. Gwynn Burly 2:56 PM

## 2015-01-17 NOTE — Progress Notes (Addendum)
INFECTIOUS DISEASE PROGRESS NOTE  ID: Jeffery [name] is a 24 y.o. male with  Principal Problem:   Severe sepsis ([place]) Active Problems:   Acute on chronic respiratory failure with hypoxia (HCC)   Cerebral palsy, quadriplegic (HCC)   Acquired pancytopenia (HCC)   Transaminitis   Seizures (HCC)   Pressure ulcer   Osteomyelitis (HCC)   Malnutrition (HCC)   Hypernatremia   AKI (acute kidney injury) ([place])  Subjective: ? Aspiration last 24h Has been hypoxic, back on bipap.  Family concerned that he is fluid overloaded.   Abtx:  Anti-infectives    Start     Dose/Rate Route Frequency Ordered Stop   01/14/15 1200  cefTAZidime (FORTAZ) 2 g in dextrose 5 % 50 mL IVPB  Status:  Discontinued     2 g 100 mL/hr over 30 Minutes Intravenous Every 24 hours 01/13/15 1344 01/13/15 1553   01/13/15 1800  ceftaroline (TEFLARO) 300 mg in sodium chloride 0.9 % 250 mL IVPB     300 mg 250 mL/hr over 60 Minutes Intravenous Every 12 hours 01/13/15 1558     01/13/15 1700  ceftaroline (TEFLARO) 300 mg in sodium chloride 0.9 % 250 mL IVPB  Status:  Discontinued     300 mg 250 mL/hr over 60 Minutes Intravenous Every 12 hours 01/13/15 1557 01/13/15 1558   01/13/15 1600  metroNIDAZOLE (FLAGYL) IVPB 500 mg     500 mg 100 mL/hr over 60 Minutes Intravenous Every 8 hours 01/13/15 1554     01/13/15 0845  linezolid (ZYVOX) IVPB 600 mg  Status:  Discontinued     600 mg 300 mL/hr over 60 Minutes Intravenous Every 12 hours 01/11/2015 2345 01/28/2015 2350   01/19/2015 2200  linezolid (ZYVOX) IVPB 600 mg  Status:  Discontinued     600 mg 300 mL/hr over 60 Minutes Intravenous Every 12 hours 01/16/2015 2020 01/13/15 1532   01/22/2015 2200  cefTAZidime (FORTAZ) 2 g in dextrose 5 % 50 mL IVPB  Status:  Discontinued     2 g 100 mL/hr over 30 Minutes Intravenous Every 12 hours 01/15/2015 2034 01/13/15 1344      Medications:  Scheduled: . antiseptic oral rinse  7 mL Mouth Rinse q12n4p  . ceFTAROline (TEFLARO) IV  300 mg  Intravenous Q12H  . chlorhexidine  15 mL Mouth Rinse BID  . cloBAZam  3.75 mg Per Tube TID AC  . [facility] Beanaid  2 capsule Per J Tube TID  . feeding supplement (PRO-STAT SUGAR FREE 64)  30 mL Per Tube TID  . free water  200 mL Per Tube 3 times per day  . gabapentin  900 mg Per Tube 3 times per day  . levalbuterol  0.63 mg Nebulization Q6H  . metoCLOPramide  10 mg Per Tube TID AC & HS  . metronidazole  500 mg Intravenous Q8H  . pantoprazole sodium  40 mg Per Tube BID  . sodium chloride  3 mL Intravenous Q12H  . sucralfate  1 g Per Tube 4 times per day    Objective: Vital signs in last 24 hours: Temp:  [98 F (36.7 C)-99 F (37.2 C)] 98.5 F (36.9 C) (12/16 0700) Pulse Rate:  [98-148] 114 (12/16 0430) Resp:  [13-59] 14 (12/16 0430) BP: (103-113)/(54-65) 103/63 mmHg (12/16 0430) SpO2:  [79 %-100 %] 99 % (12/16 0825) FiO2 (%):  [50 %-80 %] 50 % (12/16 0430)   General appearance: fatigued Resp: rhonchi anterior - bilateral Chest wall: port non-tender.  Cardio:  tachycardia GI: abnormal findings:  distended and hypoactive bowel sounds Extremities: anasarca  Lab Results  Recent Labs  01/16/15 0347 01/17/15 0400  WBC 7.0 4.6  HGB 8.9* 8.7*  HCT 29.9* 28.3*  NA 155* 155*  K 2.9* 3.0*  CL 124* 125*  CO2 22 20*  BUN 31* 30*  CREATININE 1.49* 1.47*   Liver Panel  Recent Labs  01/16/15 0347  PROT 4.0*  ALBUMIN 1.4*  AST 25  ALT 43  ALKPHOS 378*  BILITOT 0.8   Sedimentation Rate No results for input(s): ESRSEDRATE in the last 72 hours. C-Reactive Protein No results for input(s): CRP in the last 72 hours.  Microbiology: Recent Results (from the past 240 hour(s))  Culture, blood (routine x 2)     Status: None (Preliminary result)   Collection Time: 01/17/2015  2:21 PM  Result Value Ref Range Status   Specimen Description PORTA CATH A-LINE  Final   Special Requests BOTTLES DRAWN AEROBIC AND ANAEROBIC 5ML  Final   Culture NO GROWTH 4 DAYS  Final   Report  Status PENDING  Incomplete  Culture, blood (routine x 2)     Status: None (Preliminary result)   Collection Time: 01/13/2015  7:30 PM  Result Value Ref Range Status   Specimen Description BLOOD LEFT HAND  Final   Special Requests PEDIATRICS 2CC  Final   Culture NO GROWTH 4 DAYS  Final   Report Status PENDING  Incomplete  Urine culture     Status: None   Collection Time: 01/30/2015  7:44 PM  Result Value Ref Range Status   Specimen Description URINE, RANDOM  Final   Special Requests PEDS BAG  Final   Culture 4,000 COLONIES/mL INSIGNIFICANT GROWTH  Final   Report Status 01/13/2015 FINAL  Final  C difficile quick scan w PCR reflex     Status: None   Collection Time: 01/13/15 12:45 AM  Result Value Ref Range Status   C Diff antigen NEGATIVE NEGATIVE Final   C Diff toxin NEGATIVE NEGATIVE Final   C Diff interpretation Negative for toxigenic C. difficile  Final  Urine culture     Status: None (Preliminary result)   Collection Time: 01/16/15  4:15 PM  Result Value Ref Range Status   Specimen Description URINE, CATHETERIZED  Final   Special Requests NONE  Final   Culture NO GROWTH < 24 HOURS  Final   Report Status PENDING  Incomplete    Studies/Results: Dg Chest Port 1 View  01/16/2015  CLINICAL DATA:  Shortness of breath. EXAM: PORTABLE CHEST 1 VIEW COMPARISON:  January 15, 2015. FINDINGS: Hypoinflation of the lungs is noted. Stable cardiomediastinal silhouette. Status post surgical fusion of the thoracic and lumbar spine. Mild to moderate bibasilar opacities are noted concerning for subsegmental atelectasis or possibly pneumonia. No pneumothorax is noted. No significant pleural effusion is noted. IMPRESSION: Mild to moderate bibasilar opacities are noted concerning for subsegmental atelectasis or pneumonia. Hypoinflation of the lungs is again noted. Electronically Signed   By: Marijo Conception, M.D.   On: 01/16/2015 18:50   Dg Chest Port 1 View  01/15/2015  CLINICAL DATA:  Pneumonia. EXAM:  PORTABLE CHEST 1 VIEW COMPARISON:  02/01/2015 FINDINGS: Chronic low lung volumes. Increased densities at the left lung base are concerning for worsening atelectasis or infection. Stable appearance of the heart and mediastinum. Port-A-Cath in the SVC region. Stable appearance of the spinal surgical hardware. IMPRESSION: Increased densities in the left lower lobe are concerning for worsening atelectasis or  infection. Chronic low lung volumes. Electronically Signed   By: Markus Daft M.D.   On: 01/15/2015 13:02   Dg Abd Portable 1v  01/15/2015  CLINICAL DATA:  Abdominal distension. EXAM: PORTABLE ABDOMEN - 1 VIEW COMPARISON:  10/15/2014 FINDINGS: Spinal hardware throughout the thoracolumbar spine extending into the pelvis. Chronic changes involving the pelvic bones and hips. There is a GJ feeding tube and the tip is probably in the jejunal region. Again noted is an implant infusion device in the right lower abdomen. Few gas-filled loops of bowel but nonobstructive pattern. IMPRESSION: No acute abnormality. Electronically Signed   By: Markus Daft M.D.   On: 01/15/2015 13:08     Assessment/Plan: ARF Hypernatremia Na slightly back up, hydration.  continues to need K supplementation  Osteomyelitis of sacrum Would aim to continue his current anbx for 28 days  Possible HCAP Sepsis Will continue ceftaroline/flagyl Will check sputum Cx but not sure when can change his therapy given his prev ADRs.  Family asks about chest physiotherapy, not clear this can be done with his port.   Thrombocytopenia due to Zyvox Will continue to watch.     abd distension  abd u/s shows gas, cholelithiasis (known prev) abd film does not show ileus or obstruction He had I/o cath yesterday showing >400 cc.  Foley placed  Anemia Seen by h/o  consider LTACH when more stable Appreciate Palliative Care eval of pt- spoke with husband, not clear about his understanding or expectations.   Total days of antibiotics: 17 (4  ceftaroline/flagyl)         [name] Infectious Diseases (pager) [phone] [email or web address] 01/17/2015, 10:04 AM  LOS: 5 days

## 2015-01-17 NOTE — Progress Notes (Signed)
Tube feeding stopped due to patients increased respirations and decrease in oxygen saturation.  Respiratory contacted and patient placed back on bi-pap.

## 2015-01-17 NOTE — Progress Notes (Addendum)
PATIENT DETAILS Name: Jeffery [name] Age: 24 y.o. Sex: male Date of Birth: [birth year] Admit Date: 01/16/2015 Admitting Physician Edwin Dada, MD [id number], DAVID C, MD  Subjective: Taken off the BiPAP briefly this morning-per RN developed tachypnea/desaturation-suctioned-placed back on BiPAP. Father at bedside.  Assessment/Plan: Principal Problem: Severe sepsis: Likely secondary to aspiration pneumonia, but also has ongoing osteomyelitis of the sacrum. Sepsis pathophysiology seems to be slowly improving-fever curve much better. Cultures remain negative, ID following-the recommendations are to continue with ceftaroline/flagyl for 26 more days. Unfortunately, continues to have episodes of respiratory failure and aspiration pneumonitis-from poolling of secretions/vomiting.  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  Acute on chronic hypoxic respiratory failure: Hospital course complicated by episodes of acute hypoxic respiratory failure from aspiration pneumonitis (vomiting/pooling of secretions). Continue prn BiPAP.Does have chronic hypoxic respiratory failure at baseline-and requires as needed BiPAP and oxygen at home. Had long discussions with patient's father at bedside, and patient's mom over the phone today-have explained that prognosis continues to be poor-and that repeated episodes of aspiration pneumonitis/respiratory failure-are contributing to deconditioning/muscle weakness which are further exacerbating aspiration episodes. After much discussion, family wants to continue with current measures over the weekend, and reassess on Monday-concept of hospice care was brought up-family agreeable to consider hospice on Monday if no improvement.  Presumed aspiration pneumonia: See above. Has a long-standing issue with pooling of secretions-but seems to have  had more frequent episodes over the past few days. Continue when necessary Robinul, suctioning, vibrator vest, bronchodilators etc. see above  Hypernatremia: Suspect secondary to dehydration from sepsis and no/poor oral intake/tube feeds. Increase IV fluids 200 mL an hour, unfortunately every time we have attempted to start enteral feeding/flushes for the past 2-3 days-patient has had episodes of aspiration/vomiting. Family-agreeable to try enteral feeding again.   Hypokalemia: Replete and recheck. Likely secondary to vomiting and diarrhea.  ARF: Likely prerenal azotemia in the setting of sepsis, slowly improving.  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 Mother reports that LFTs worsens after he gets Neupogen. RUQ ultrasound negative for hepatobiliary pathology  Anemia/Leukopenia: Chronic issue-attributed to bone marrow suppression from Keppra and other medications. Oncology/Hematology following.  Diarrhea:? Secondary to antibiotics. C. difficile PCR negative. Supportive care-prn Imodium.  Sacral decubitus stage IV: Present prior to [facility] care following.  Dysphagia requring chronic J tube feeds: Continue regular tube feeds-is NPO. See above regarding aspiration  History of seizures:Continue Onfi and Neurontin.Ensure follow up with Dr Gaynell Face  Hx of cerebral palsy/spastic quadriplegic: Has a intrathecal baclofen pump in place.   [id number] to neurologic issues-on BIPAP Qhs  Palliative care:DNR in place-unfortunate 24 year old with cerebral palsy and spastic quadriplegia-history of seizures-now with recurrent admissions for sepsis from aspiration pneumonia and  ongoing sacral osteomyelitis. Family aware that not a candidate for further escalation of care-goal is to try and manage this situation with antibiotics as much as possible. Unfortunately, hospital course complicated with the past few days by development of aspiration  pneumonitis and acute respiratory failure-long discussion with family-mother over the phone on 12/16, and father at bedside-suggested initiation of hospice/comfort care at some point in the in the next few days-family requesting that we continue with current measures over the weekend and reassess on Monday. Family willing to consider hospice if no improvement over the weekend. I have also placed a call to  patient's PCP-Dr. Earma Reading at family's request.  Addendum: After above discussion with patient's mother-was informed by the nurse that the  mother wanted the patient discharged. Have subsequently spoke with the patient's mother, for now-plans remain to watch over the weekend and reassess on Monday.  Disposition: Remain inpatient-remain in SDU  Antimicrobial agents  See below  Anti-infectives    Start     Dose/Rate Route Frequency Ordered Stop   01/14/15 1200  cefTAZidime (FORTAZ) 2 g in dextrose 5 % 50 mL IVPB  Status:  Discontinued     2 g 100 mL/hr over 30 Minutes Intravenous Every 24 hours 01/13/15 1344 01/13/15 1553   01/13/15 1800  ceftaroline (TEFLARO) 300 mg in sodium chloride 0.9 % 250 mL IVPB     300 mg 250 mL/hr over 60 Minutes Intravenous Every 12 hours 01/13/15 1558     01/13/15 1700  ceftaroline (TEFLARO) 300 mg in sodium chloride 0.9 % 250 mL IVPB  Status:  Discontinued     300 mg 250 mL/hr over 60 Minutes Intravenous Every 12 hours 01/13/15 1557 01/13/15 1558   01/13/15 1600  metroNIDAZOLE (FLAGYL) IVPB 500 mg     500 mg 100 mL/hr over 60 Minutes Intravenous Every 8 hours 01/13/15 1554     01/13/15 0845  linezolid (ZYVOX) IVPB 600 mg  Status:  Discontinued     600 mg 300 mL/hr over 60 Minutes Intravenous Every 12 hours 01/25/2015 2345 01/19/2015 2350   01/13/2015 2200  linezolid (ZYVOX) IVPB 600 mg  Status:  Discontinued     600 mg 300 mL/hr over 60 Minutes Intravenous Every 12 hours 01/02/2015 2020 01/13/15 1532   02/01/2015 2200  cefTAZidime (FORTAZ) 2 g in dextrose 5 % 50 mL  IVPB  Status:  Discontinued     2 g 100 mL/hr over 30 Minutes Intravenous Every 12 hours 01/30/2015 2034 01/13/15 1344      DVT Prophylaxis: SCD's  Code Status:  DNR  Family Communication Father at bedside Mother over the phone  Procedures: None  CONSULTS:  pulmonary/intensive care, ID and hematology/oncology  Time spent 35 minutes-Greater than 50% of this time was spent in counseling, explanation of diagnosis, planning of further management, and coordination of care.  MEDICATIONS: Scheduled Meds: . antiseptic oral rinse  7 mL Mouth Rinse q12n4p  . ceFTAROline (TEFLARO) IV  300 mg Intravenous Q12H  . chlorhexidine  15 mL Mouth Rinse BID  . cloBAZam  3.75 mg Per Tube TID AC  . [facility] Beanaid  2 capsule Per J Tube TID  . feeding supplement (PRO-STAT SUGAR FREE 64)  30 mL Per Tube TID  . free water  200 mL Per Tube 3 times per day  . gabapentin  900 mg Per Tube 3 times per day  . levalbuterol  0.63 mg Nebulization Q6H  . levalbuterol  0.63 mg Nebulization QID  . metoCLOPramide  10 mg Per Tube TID AC & HS  . metronidazole  500 mg Intravenous Q8H  . pantoprazole sodium  40 mg Per Tube BID  . sodium chloride  3 mL Intravenous Q12H  . sucralfate  1 g Per Tube 4 times per day   Continuous Infusions: . baclofen    . dextrose 5 % 1,000 mL with potassium chloride 20 mEq infusion 100 mL/hr at 01/17/15 1100  . TWOCAL HN 237 mL (01/17/15 1000)   PRN Meds:.acetaminophen (TYLENOL) oral liquid 160 mg/5 mL **OR** acetaminophen, dextrose, dicyclomine, glycopyrrolate, ibuprofen, ketoconazole, levalbuterol, loperamide, zinc oxide  PHYSICAL EXAM: Vital signs in last 24 hours: Filed Vitals:   01/17/15 1005 01/17/15 1104 01/17/15 1114 01/17/15 1116  BP:  96/56    Pulse: 125 106  103  Temp:    98.6 F (37 C)  TempSrc:    Rectal  Resp: 19 14  14   Height:      Weight:      SpO2: 95% 98% 99% 99%    Weight change:  Filed Weights   01/27/2015 2341  Weight: 58.8 kg (129 lb  10.1 oz)   Body mass index is 26.17 kg/(m^2).  Gen Exam: On BiPAP this morning. Sleeping comfortably.  Neck: Supple Chest: Transmitted upper airway sounds CVS: S1 S2 Regular, no murmurs.  Abdomen: soft, BS +, non tender, non distended. G-tube in place Extremities: no edema, lower extremities warm to touch. Neurologic: Decreased bulk-0 X 5 in all 4 ext   Intake/Output from previous day:  Intake/Output Summary (Last 24 hours) at 01/17/15 1353 Last data filed at 01/17/15 1155  Gross per 24 hour  Intake   2455 ml  Output   1200 ml  Net   1255 ml     LAB RESULTS: CBC  Recent Labs Lab 01/04/2015 1921  01/13/15 0400 01/14/15 0200 01/15/15 0015 01/16/15 0347 01/17/15 0400  WBC 5.9  < > 6.3 9.6 4.8 7.0 4.6  HGB 11.7*  < > 8.0* 11.8* 10.1* 8.9* 8.7*  HCT 41.5  < > 29.7* 39.3 32.2* 29.9* 28.3*  PLT 219  < > 98* 86* 68* 64* 74*  MCV 115.6*  < > 117.4* 100.8* 98.2 99.3 98.3  MCH 32.6  < > 31.6 30.3 30.8 29.6 30.2  MCHC 28.2*  < > 26.9* 30.0 31.4 29.8* 30.7  RDW 21.4*  < > 20.0* 23.0* 21.7* 21.6* 21.5*  LYMPHSABS 0.5*  --   --   --   --  0.4* 0.5*  MONOABS 0.4  --   --   --   --  0.5 0.4  EOSABS 0.1  --   --   --   --  0.1 0.2  BASOSABS 0.1  --   --   --   --  0.0 0.0  < > = values in this interval not displayed.  Chemistries   Recent Labs Lab 01/13/15 0150  01/14/15 0900 01/15/15 0015 01/15/15 0810 01/16/15 0347 01/17/15 0400  NA  --   < > 146* 152* 151* 155* 155*  K  --   < > 3.8 3.0* 2.6* 2.9* 3.0*  CL  --   < > 115* 119* 119* 124* 125*  CO2  --   < > 20* 23 23 22  20*  GLUCOSE  --   < > 95 105* 122* 97 107*  BUN  --   < > 46* 40* 37* 31* 30*  CREATININE 1.66*  < > 1.57* 1.48* 1.48* 1.49* 1.47*  CALCIUM  --   < > 7.4* 8.0* 7.8* 8.1* 8.1*  MG 1.8  --   --   --   --   --  1.6*  < > = values in this interval not displayed.  CBG:  Recent Labs Lab 01/16/15 1958 01/17/15 0022 01/17/15 0431 01/17/15 0754 01/17/15 1142  GLUCAP 129* 92 94 92 136*     GFR Estimated Creatinine Clearance: 57.1 mL/min (by C-G formula based on Cr of 1.47).  Coagulation profile No results for input(s): INR, PROTIME in the last 168 hours.  Cardiac Enzymes No results for input(s): CKMB,  TROPONINI, MYOGLOBIN in the last 168 hours.  Invalid input(s): CK  Invalid input(s): POCBNP No results for input(s): DDIMER in the last 72 hours. No results for input(s): HGBA1C in the last 72 hours. No results for input(s): CHOL, HDL, LDLCALC, TRIG, CHOLHDL, LDLDIRECT in the last 72 hours. No results for input(s): TSH, T4TOTAL, T3FREE, THYROIDAB in the last 72 hours.  Invalid input(s): FREET3 No results for input(s): VITAMINB12, FOLATE, FERRITIN, TIBC, IRON, RETICCTPCT in the last 72 hours. No results for input(s): LIPASE, AMYLASE in the last 72 hours.  Urine Studies No results for input(s): UHGB, CRYS in the last 72 hours.  Invalid input(s): UACOL, UAPR, USPG, UPH, UTP, UGL, UKET, UBIL, UNIT, UROB, ULEU, UEPI, UWBC, URBC, UBAC, CAST, UCOM, BILUA  MICROBIOLOGY: Recent Results (from the past 240 hour(s))  Culture, blood (routine x 2)     Status: None   Collection Time: 01/21/2015  2:21 PM  Result Value Ref Range Status   Specimen Description PORTA CATH A-LINE  Final   Special Requests BOTTLES DRAWN AEROBIC AND ANAEROBIC 5ML  Final   Culture NO GROWTH 5 DAYS  Final   Report Status 01/17/2015 FINAL  Final  Culture, blood (routine x 2)     Status: None   Collection Time: 01/11/2015  7:30 PM  Result Value Ref Range Status   Specimen Description BLOOD LEFT HAND  Final   Special Requests PEDIATRICS 2CC  Final   Culture NO GROWTH 5 DAYS  Final   Report Status 01/17/2015 FINAL  Final  Urine culture     Status: None   Collection Time: 01/07/2015  7:44 PM  Result Value Ref Range Status   Specimen Description URINE, RANDOM  Final   Special Requests PEDS BAG  Final   Culture 4,000 COLONIES/mL INSIGNIFICANT GROWTH  Final   Report Status 01/13/2015 FINAL  Final  C  difficile quick scan w PCR reflex     Status: None   Collection Time: 01/13/15 12:45 AM  Result Value Ref Range Status   C Diff antigen NEGATIVE NEGATIVE Final   C Diff toxin NEGATIVE NEGATIVE Final   C Diff interpretation Negative for toxigenic C. difficile  Final  Urine culture     Status: None (Preliminary result)   Collection Time: 01/16/15  4:15 PM  Result Value Ref Range Status   Specimen Description URINE, CATHETERIZED  Final   Special Requests NONE  Final   Culture NO GROWTH < 24 HOURS  Final   Report Status PENDING  Incomplete    RADIOLOGY STUDIES/RESULTS: Ct Pelvis Wo Contrast  01/01/2015  CLINICAL DATA:  Cerebral palsy.  Clinical concern for osteomyelitis. EXAM: CT PELVIS WITHOUT CONTRAST TECHNIQUE: Multidetector CT imaging of the pelvis was performed following the standard protocol without intravenous contrast. COMPARISON:  10/15/2014 CT abdomen/pelvis. FINDINGS: Reproductive: Normal prostate and seminal vesicles. Bladder: Collapsed and grossly normal urinary bladder. Visualized ureters are normal caliber. Bowel: Visualized small and large bowel are normal caliber with no bowel wall thickening. Oral contrast traverses to the distal rectum. Partially visualized is an enteric tube in the distal duodenum and proximal jejunum, with the tip not seen on this study. Vascular/Lymphatic: No pathologically enlarged lymph nodes in the pelvis. Other: No pneumoperitoneum, ascites or focal fluid collection. Musculoskeletal: There is curvilinear gas, fat stranding and ill-defined fluid in the posterior medial left gluteal subcutaneous soft tissues (series 4/ image 125), suggestive of an incompletely visualized decubitus ulcer, with extension of the fat stranding and fluid to the inferior margin of the  left ischium. There is mild focal cortical erosion at the inferior left ischium with associated periosteal reaction, in keeping with osteomyelitis. No presacral fluid or fat stranding. No evidence of a  sacral decubitus ulcer. Subcutaneous ventral right lower quadrant spinal stimulator device is noted with lead coursing posteriorly and entering the lumbar spinal canal, with the tip not visualized on this study. No aggressive appearing focal osseous lesions. 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 There is stable diffuse osteopenia. No osseous fracture. No additional focal cortical erosions or periosteal reaction. Partially visualized is bilateral posterior spinal fusion hardware with posterior bilateral fusion rods and interlocking screws extending into the bilateral iliac wings, with no evidence of hardware fracture or loosening. IMPRESSION: 1. Osteomyelitis of the inferior left ischium associated with curvilinear fat stranding, fluid and gas in the posteromedial left gluteal subcutaneous soft tissues, presumably due to a medial left gluteal decubitus ulcer (which is incompletely visualized on this study). No focal drainable fluid collection. 2. No pelvic ascites or intraperitoneal fluid collection. No presacral fluid or inflammatory change. 3. 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 Electronically Signed   By: Ilona Sorrel M.D.   On: 01/01/2015 10:34   US Abdomen Complete  01/13/2015  CLINICAL DATA:  Acute onset of abdominal distention and sepsis. Initial encounter. EXAM: ULTRASOUND ABDOMEN COMPLETE COMPARISON:  Right upper quadrant ultrasound performed 01/05/2015, and CT of the abdomen and pelvis performed 10/15/2014 FINDINGS: Gallbladder: Stones are noted dependently in the gallbladder. The gallbladder wall is borderline prominent. No pericholecystic fluid is seen. Evaluation for ultrasonographic Murphy's sign is not possible as the patient is unconscious. Common bile duct: Diameter: 0.4 cm, within normal limits  in caliber. Liver: No focal lesion identified. Within normal limits in parenchymal echogenicity. Difficult to fully assess due to the midline pain pump. IVC: No abnormality visualized. Pancreas: Not visualized. Spleen: Size and appearance within normal limits. Right Kidney: Length: 9.4 cm. Not fully assessed due to overlying bowel gas. Echogenicity within normal limits. No mass or hydronephrosis visualized. Left Kidney: Not fully assessed due to overlying bowel gas. Echogenicity within normal limits. No mass or hydronephrosis visualized. Abdominal aorta: Not visualized due to overlying bowel gas. Other findings: None. IMPRESSION: 1. No acute abnormality seen within the abdomen. Evaluation is significantly suboptimal due to bowel gas. 2. Cholelithiasis. Borderline prominent gallbladder wall. Gallbladder otherwise grossly unremarkable. Electronically Signed   By: Garald Balding M.D.   On: 01/13/2015 23:07   Ir Replc Duoden/jejuno Tube Percut W/fluoro  01/01/2015  CLINICAL DATA:  Routine jejunostomy tube exchange EXAM: IR REPLACE DUODEN/JEJUNO TUBE PERCUT WITH FLOURO FLUOROSCOPY TIME:  12 seconds MEDICATIONS AND MEDICAL HISTORY: None ANESTHESIA/SEDATION: None CONTRAST:  5 cc Omnipaque 300 PROCEDURE: The procedure, risks, benefits, and alternatives were explained to the patient. Questions regarding the procedure were encouraged and answered. The patient understands and consents to the procedure. The abdomen was prepped with Betadine in a sterile fashion, and a sterile drape was applied covering the operative field. A sterile gown and sterile gloves were used for the procedure. The existing gastrojejunostomy tube was removed over a stiff glidewire and exchange for a new make jejunostomy tube. The tip was positioned in the proximal jejunum. Contrast was injected. 10 cc saline was utilized to insufflate the balloon. FINDINGS: The jejunostomy tube is been exchange. Tip is in the proximal jejunum. COMPLICATIONS: None  IMPRESSION: Successful jejunostomy tube exchange.  Electronically Signed   By: Marybelle Killings M.D.   On: 01/01/2015 12:18   Dg Chest Port 1 View  01/16/2015  CLINICAL DATA:  Shortness of breath. EXAM: PORTABLE CHEST 1 VIEW COMPARISON:  January 15, 2015. FINDINGS: Hypoinflation of the lungs is noted. Stable cardiomediastinal silhouette. Status post surgical fusion of the thoracic and lumbar spine. Mild to moderate bibasilar opacities are noted concerning for subsegmental atelectasis or possibly pneumonia. No pneumothorax is noted. No significant pleural effusion is noted. IMPRESSION: Mild to moderate bibasilar opacities are noted concerning for subsegmental atelectasis or pneumonia. Hypoinflation of the lungs is again noted. Electronically Signed   By: Marijo Conception, M.D.   On: 01/16/2015 18:50   Dg Chest Port 1 View  01/15/2015  CLINICAL DATA:  Pneumonia. EXAM: PORTABLE CHEST 1 VIEW COMPARISON:  01/21/2015 FINDINGS: Chronic low lung volumes. Increased densities at the left lung base are concerning for worsening atelectasis or infection. Stable appearance of the heart and mediastinum. Port-A-Cath in the SVC region. Stable appearance of the spinal surgical hardware. IMPRESSION: Increased densities in the left lower lobe are concerning for worsening atelectasis or infection. Chronic low lung volumes. Electronically Signed   By: Markus Daft M.D.   On: 01/15/2015 13:02   Dg Chest Portable 1 View  01/10/2015  CLINICAL DATA:  Fever. EXAM: PORTABLE CHEST 1 VIEW COMPARISON:  December 3rd 2016. FINDINGS: Stable chronic low lung volumes are noted. Stable surgical fusion of thoraco lumbar spine is noted. No pneumothorax is noted. Increased bibasilar linear densities are noted most consistent with worsening subsegmental atelectasis or possibly pneumonia. Cardiomediastinal silhouette is unchanged. IMPRESSION: Stable chronic low lung volumes are noted. Increased bibasilar linear densities are noted most consistent with  worsening subsegmental atelectasis or possibly pneumonia. Electronically Signed   By: Marijo Conception, M.D.   On: 01/20/2015 20:01   Dg Chest Port 1 View  01/04/2015  CLINICAL DATA:  Shortness of breath and fever.  Recent pneumonia. EXAM: PORTABLE CHEST 1 VIEW COMPARISON:  01/01/2015 and 12/31/2014. FINDINGS: 0814 hours. There are chronic low lung volumes. The patient's mandible overlies the right lung apex. Chronic bibasilar atelectasis or scarring is unchanged. There is no airspace disease, edema or pleural effusion. Right subclavian Port-A-Cath appears unchanged. The bones appear unchanged status post thoracolumbar fusion. IMPRESSION: No significant change in chronic bibasilar atelectasis or scarring. No acute findings demonstrated. Electronically Signed   By: Richardean Sale M.D.   On: 01/04/2015 10:29   Portable Chest 1 View  01/01/2015  CLINICAL DATA:  Shortness of breath, cough, dyspnea.  Cerebral palsy EXAM: PORTABLE CHEST 1 VIEW COMPARISON:  Yesterday FINDINGS: Porta catheter on the right with tip in good position at the SVC level. Persistent hypoventilation and linear opacities at the bases. There is no edema, consolidation, effusion, or pneumothorax. Normal heart size and mediastinal contours. Extensive spinal fixation. IMPRESSION: Chronic hypoventilation with basilar atelectasis or scarring. Electronically Signed   By: Monte Fantasia M.D.   On: 01/01/2015 07:40   Dg Chest Port 1 View  12/31/2014  CLINICAL DATA:  Chest pain recent pneumonia persistent fever for 1 week EXAM: PORTABLE CHEST 1 VIEW COMPARISON:  10/19/2014 FINDINGS: Spinal rods noted. Port-A-Cath stable on the right. Low lung volumes with bibasilar atelectasis. Heart size normal. IMPRESSION: Mild bilateral lower lobe atelectasis. No definite evidence of pneumonia. Subtle infiltrate could be due obscured by the bilateral lower lobe atelectasis however. Electronically Signed   By: Skipper Cliche M.D.   On: 12/31/2014 17:39   Dg  Abd Portable 1v  01/15/2015  CLINICAL DATA:  Abdominal distension. EXAM: PORTABLE ABDOMEN - 1 VIEW COMPARISON:  10/15/2014 FINDINGS: Spinal hardware throughout the thoracolumbar spine extending into the pelvis. Chronic changes involving the pelvic bones and hips. There is a GJ feeding tube and the tip is probably in the jejunal region. Again noted is an implant infusion device in the right lower abdomen. Few gas-filled loops of bowel but nonobstructive pattern. IMPRESSION: No acute abnormality. Electronically Signed   By: Markus Daft M.D.   On: 01/15/2015 13:08   US Abdomen Limited Ruq  01/05/2015  CLINICAL DATA:  Elevated liver function tests. Cerebral palsy. Feeding tube and implanted pain pump in the right upper quadrant of the abdomen overlying the gallbladder area. EXAM: US ABDOMEN LIMITED - RIGHT UPPER QUADRANT COMPARISON:  Limited abdomen ultrasound dated 11/06/2014. Abdomen CT dated 10/15/2014. FINDINGS: Gallbladder: Multiple gallstones in the gallbladder measuring up to 7 mm in maximum diameter each. No gallbladder wall thickening or pericholecystic fluid. No apparent pain over the gallbladder. Common bile duct: Diameter: 3.8 mm Liver: Normal echogenicity.  No mass or biliary ductal dilatation seen. IMPRESSION: 1. Cholelithiasis without evidence cholecystitis. 2. Otherwise, unremarkable examination. Electronically Signed   By: Claudie Revering M.D.   On: 01/05/2015 15:59    Oren Binet, MD  [facility] Pager:[phone]  If 7PM-7AM, please contact night-coverage [email or web address] Password [email or web address] 01/17/2015, 1:53 PM   LOS: 5 days

## 2015-01-17 NOTE — Progress Notes (Signed)
Pt bladder scan revealed 419 mL.  [facility] on call notified of amount and that the pt was in/out cath on 12/15 and that the family requested foley catheter to be placed.  Orders for foley catheter given and carried out.  Urine output charted.  Will continue to monitor.

## 2015-01-18 ENCOUNTER — Inpatient Hospital Stay (HOSPITAL_COMMUNITY): Payer: Medicaid Other

## 2015-01-18 DIAGNOSIS — E870 Hyperosmolality and hypernatremia: Secondary | ICD-10-CM

## 2015-01-18 DIAGNOSIS — R197 Diarrhea, unspecified: Secondary | ICD-10-CM

## 2015-01-18 LAB — COMPREHENSIVE METABOLIC PANEL
ALBUMIN: 1.5 g/dL — AB (ref 3.5–5.0)
ALT: 26 U/L (ref 17–63)
ANION GAP: 11 (ref 5–15)
AST: 21 U/L (ref 15–41)
Alkaline Phosphatase: 494 U/L — ABNORMAL HIGH (ref 38–126)
BILIRUBIN TOTAL: 0.5 mg/dL (ref 0.3–1.2)
BUN: 28 mg/dL — ABNORMAL HIGH (ref 6–20)
CHLORIDE: 127 mmol/L — AB (ref 101–111)
CO2: 21 mmol/L — ABNORMAL LOW (ref 22–32)
Calcium: 8.2 mg/dL — ABNORMAL LOW (ref 8.9–10.3)
Creatinine, Ser: 1.32 mg/dL — ABNORMAL HIGH (ref 0.61–1.24)
GFR calc Af Amer: >60 mL/min (ref >60)
GFR calc non Af Amer: >60 mL/min (ref >60)
GLUCOSE: 87 mg/dL (ref 65–99)
POTASSIUM: 3.5 mmol/L (ref 3.5–5.1)
Sodium: 159 mmol/L — ABNORMAL HIGH (ref 135–145)
TOTAL PROTEIN: 4.3 g/dL — AB (ref 6.5–8.1)

## 2015-01-18 LAB — CBC WITH DIFFERENTIAL/PLATELET
BASOS ABS: 0 10*3/uL (ref 0.0–0.1)
BASOS PCT: 0 %
EOS ABS: 0.1 10*3/uL (ref 0.0–0.7)
EOS PCT: 5 %
HCT: 26.7 % — ABNORMAL LOW (ref 39.0–52.0)
Hemoglobin: 8 g/dL — ABNORMAL LOW (ref 13.0–17.0)
Lymphocytes Relative: 15 %
Lymphs Abs: 0.3 10*3/uL — ABNORMAL LOW (ref 0.7–4.0)
MCH: 30.5 pg (ref 26.0–34.0)
MCHC: 30 g/dL (ref 30.0–36.0)
MCV: 101.9 fL — ABNORMAL HIGH (ref 78.0–100.0)
MONO ABS: 0.3 10*3/uL (ref 0.1–1.0)
Monocytes Relative: 13 %
Neutro Abs: 1.6 10*3/uL — ABNORMAL LOW (ref 1.7–7.7)
Neutrophils Relative %: 67 %
PLATELETS: 68 10*3/uL — AB (ref 150–400)
RBC: 2.62 MIL/uL — ABNORMAL LOW (ref 4.22–5.81)
RDW: 22 % — AB (ref 11.5–15.5)
WBC: 2.3 10*3/uL — ABNORMAL LOW (ref 4.0–10.5)

## 2015-01-18 LAB — GLUCOSE, CAPILLARY
GLUCOSE-CAPILLARY: 79 mg/dL (ref 65–99)
Glucose-Capillary: 73 mg/dL (ref 65–99)
Glucose-Capillary: 76 mg/dL (ref 65–99)
Glucose-Capillary: 87 mg/dL (ref 65–99)
Glucose-Capillary: 90 mg/dL (ref 65–99)
Glucose-Capillary: 93 mg/dL (ref 65–99)

## 2015-01-18 LAB — PREPARE RBC (CROSSMATCH)

## 2015-01-18 MED ORDER — FILGRASTIM 480 MCG/1.6ML IJ SOLN
480.0000 ug | Freq: Once | INTRAMUSCULAR | Status: AC
  Administered 2015-01-18: 480 ug via SUBCUTANEOUS
  Filled 2015-01-18: qty 2

## 2015-01-18 MED ORDER — SODIUM CHLORIDE 0.9 % IV SOLN
Freq: Once | INTRAVENOUS | Status: AC
  Administered 2015-01-18: 14:00:00 via INTRAVENOUS

## 2015-01-18 MED ORDER — ACETAMINOPHEN 325 MG PO TABS: 650.0000 mg | ORAL_TABLET | Freq: Once | ORAL | Status: DC

## 2015-01-18 MED ORDER — LOPERAMIDE HCL 1 MG/5ML PO LIQD
2.0000 mg | Freq: Three times a day (TID) | ORAL | Status: DC
  Administered 2015-01-18 – 2015-01-19 (×3): 2 mg
  Filled 2015-01-18 (×9): qty 10

## 2015-01-18 MED ORDER — POTASSIUM CHLORIDE 20 MEQ/15ML (10%) PO SOLN
40.0000 meq | Freq: Once | ORAL | Status: AC
  Administered 2015-01-18: 40 meq
  Filled 2015-01-18: qty 30

## 2015-01-18 MED ORDER — DIPHENHYDRAMINE HCL 50 MG/ML IJ SOLN
25.0000 mg | Freq: Once | INTRAMUSCULAR | Status: AC
  Administered 2015-01-18: 25 mg via INTRAVENOUS
  Filled 2015-01-18: qty 1

## 2015-01-18 MED ORDER — FREE WATER
200.0000 mL | Freq: Four times a day (QID) | Status: DC
  Administered 2015-01-18 – 2015-01-19 (×3): 200 mL

## 2015-01-18 MED ORDER — FILGRASTIM 300 MCG/ML IJ SOLN
300.0000 ug | INTRAMUSCULAR | Status: DC
  Filled 2015-01-18: qty 1

## 2015-01-18 NOTE — Progress Notes (Signed)
   01/18/15 1800  Clinical Encounter Type  Visited With Patient and family together  Visit Type Initial;Spiritual support;Social support;Critical Care  Referral From Family;Nurse  Consult/Referral To Chaplain  Spiritual Encounters  Spiritual Needs Prayer;Emotional  Stress Factors  Family Stress Factors Exhausted;Loss;Loss of control  Advance Directives (For Healthcare)  Does patient have an advance directive? Yes   Chaplain was Page to give support to Dad of Pt. Chaplain provided spiritual care via Prayer and Chaplain provided emotional support via pastoral counseling. Mother of Pt. requested Spiritual care Department to visit 2 times a day. Once in the A.M and Once in the P.M.

## 2015-01-18 NOTE — Progress Notes (Signed)
Pt's father refuses to have pt turned every two hours while in the bed, educated on skin breakdown. Consuelo Pandy RN

## 2015-01-18 NOTE — Progress Notes (Signed)
Pt's father refuses tube feed for pt today. Consuelo Pandy RN

## 2015-01-18 NOTE — Progress Notes (Signed)
PATIENT DETAILS Name: Jeffery [name] Age: 24 y.o. Sex: male Date of Birth: [birth year] Admit Date: 01/30/2015 Admitting Physician Edwin Dada, MD [id number], DAVID C, MD  Subjective: Appears weak-but not in distress-sleeping comfortably. Father at bedside. Now on O2 via nasal   Assessment/Plan: Principal Problem: Severe sepsis: Likely secondary to aspiration pneumonia, but also has ongoing osteomyelitis of the sacrum. Sepsis pathophysiology seems to be slowly improving-fever curve much better. Cultures remain negative, ID following-the recommendations are to continue with ceftaroline/flagyl for 25 more days. Unfortunately, continues to have episodes of respiratory failure and aspiration pneumonitis-from poolling of secretions/vomiting. 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  Acute on chronic hypoxic respiratory failure: Hospital course complicated by episodes of acute hypoxic respiratory failure from aspiration pneumonitis (vomiting/pooling of secretions). Family aware that given repeated acute illness-patient is very deconditioned and weaker that usual-which is likely further exacerbating aspiration episodes. Continue BiPAP support/suctioning prn-family has refused to meet with Palliative care-after discussion with family on 12/16-plans are to continue with current care and re-assess on 12/19  Presumed aspiration pneumonia: See above. Has a long-standing issue with pooling of secretions-but seems to have had more frequent episodes over the past few days. Continue when necessary Robinul, suctioning, vibrator vest, bronchodilators etc. see above  Hypernatremia: Suspect secondary to dehydration from sepsis, inability to start tube feeds due to aspiration/resp failure and diarrhea. Increase IV fluids  125 mL an hour, unfortunately every time we have attempted to start enteral feeding/flushes for the past 2-3 days-patient has had episodes of aspiration/vomiting. Start scheduled Imodium.Family-agreeable to try enteral feeding again.   Hypokalemia: Replete and recheck. Likely secondary to vomiting and diarrhea.  ARF: Likely prerenal azotemia in the setting of sepsis, slowly improving.  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 Mother reports that LFTs worsens after he gets Neupogen. RUQ ultrasound negative for hepatobiliary pathology  Anemia/Leukopenia: Chronic issue-attributed to bone marrow suppression from Keppra and other medications. Oncology/Hematology following.Transfuse 1 unit of PRBC today-may need Neupogen later today  Diarrhea:? Secondary to antibiotics. C. difficile PCR negative. Supportive care-will attempt to see if better with scheduled Imodium.  Sacral decubitus stage IV: Present prior to [facility] care following.  Dysphagia requring chronic J tube feeds: Continue regular tube feeds-is NPO. See above regarding aspiration  History of seizures:Continue Onfi and Neurontin.Ensure follow up with Dr Gaynell Face  Hx of cerebral palsy/spastic quadriplegic: Has a intrathecal baclofen pump in place.   [id number] to neurologic issues-on BIPAP Qhs  Palliative care:DNR in place-unfortunate 24 year old with cerebral palsy and spastic quadriplegia-history of seizures-now with recurrent admissions for sepsis from aspiration pneumonia and  ongoing sacral osteomyelitis. Family aware that not a candidate for further escalation of care-goal is to try and manage this situation with antibiotics as much as possible. Unfortunately, hospital course complicated with the past few days by development of aspiration pneumonitis and acute respiratory failure-long discussion with family-mother over the phone on 12/16, and father at bedside-suggested initiation  of hospice/comfort care at some point in the in the next few days-family requesting that we continue with current measures over the weekend and reassess on Monday.Family has refused to meet with Palliative care. DNR in place.    Disposition: Remain inpatient-remain in SDU  Antimicrobial agents  See below  Anti-infectives  Start     Dose/Rate Route Frequency Ordered Stop   01/14/15 1200  cefTAZidime (FORTAZ) 2 g in dextrose 5 % 50 mL IVPB  Status:  Discontinued     2 g 100 mL/hr over 30 Minutes Intravenous Every 24 hours 01/13/15 1344 01/13/15 1553   01/13/15 1800  ceftaroline (TEFLARO) 300 mg in sodium chloride 0.9 % 250 mL IVPB     300 mg 250 mL/hr over 60 Minutes Intravenous Every 12 hours 01/13/15 1558     01/13/15 1700  ceftaroline (TEFLARO) 300 mg in sodium chloride 0.9 % 250 mL IVPB  Status:  Discontinued     300 mg 250 mL/hr over 60 Minutes Intravenous Every 12 hours 01/13/15 1557 01/13/15 1558   01/13/15 1600  metroNIDAZOLE (FLAGYL) IVPB 500 mg     500 mg 100 mL/hr over 60 Minutes Intravenous Every 8 hours 01/13/15 1554     01/13/15 0845  linezolid (ZYVOX) IVPB 600 mg  Status:  Discontinued     600 mg 300 mL/hr over 60 Minutes Intravenous Every 12 hours 01/13/2015 2345 01/18/2015 2350   01/13/2015 2200  linezolid (ZYVOX) IVPB 600 mg  Status:  Discontinued     600 mg 300 mL/hr over 60 Minutes Intravenous Every 12 hours 01/27/2015 2020 01/13/15 1532   01/15/2015 2200  cefTAZidime (FORTAZ) 2 g in dextrose 5 % 50 mL IVPB  Status:  Discontinued     2 g 100 mL/hr over 30 Minutes Intravenous Every 12 hours 01/05/2015 2034 01/13/15 1344      DVT Prophylaxis: SCD's  Code Status:  DNR  Family Communication Father at bedside  Procedures: None  CONSULTS:  pulmonary/intensive care, ID and hematology/oncology  Time spent 35 minutes-Greater than 50% of this time was spent in counseling, explanation of diagnosis, planning of further management, and coordination of  care.  MEDICATIONS: Scheduled Meds: . sodium chloride   Intravenous Once  . acetaminophen  650 mg Oral Once  . antiseptic oral rinse  7 mL Mouth Rinse q12n4p  . ceFTAROline (TEFLARO) IV  300 mg Intravenous Q12H  . chlorhexidine  15 mL Mouth Rinse BID  . cloBAZam  3.75 mg Per Tube TID AC  . [facility] Beanaid  2 capsule Per J Tube TID  . diphenhydrAMINE  25 mg Intravenous Once  . feeding supplement (PRO-STAT SUGAR FREE 64)  30 mL Per Tube TID  . free water  200 mL Per Tube Q6H  . gabapentin  900 mg Per Tube 3 times per day  . levalbuterol  0.63 mg Nebulization Q6H  . loperamide  2 mg Per Tube TID  . metoCLOPramide  10 mg Per Tube TID AC & HS  . metronidazole  500 mg Intravenous Q8H  . pantoprazole sodium  40 mg Per Tube BID  . sodium chloride  3 mL Intravenous Q12H  . sucralfate  1 g Per Tube 4 times per day   Continuous Infusions: . baclofen    . dextrose 5 % 1,000 mL with potassium chloride 20 mEq infusion 125 mL/hr at 01/18/15 0715  . TWOCAL HN 237 mL (01/17/15 1000)   PRN Meds:.acetaminophen (TYLENOL) oral liquid 160 mg/5 mL **OR** acetaminophen, dextrose, dicyclomine, glycopyrrolate, ibuprofen, ketoconazole, levalbuterol, zinc oxide    PHYSICAL EXAM: Vital signs in last 24 hours: Filed Vitals:   01/18/15 0344 01/18/15 0435 01/18/15 0715 01/18/15 0850  BP:  104/70 120/73   Pulse: 107 107 118   Temp:  98.6 F (37 C) 99 F (37.2 C)  TempSrc:  Rectal Rectal   Resp: 10 10 15    Height:      Weight:      SpO2:  99% 98% 98%    Weight change:  Filed Weights   01/05/2015 2341  Weight: 58.8 kg (129 lb 10.1 oz)   Body mass index is 26.17 kg/(m^2).  Gen Exam: On [place] this am. Sleeping comfortably.  Neck: Supple Chest: Mostly clear today CVS: S1 S2 Regular, no murmurs.  Abdomen: soft, BS +, non tender, non distended. G-tube in place Extremities: no edema, lower extremities warm to touch. Neurologic: Decreased bulk-0 X 5 in all 4 ext   Intake/Output from previous  day:  Intake/Output Summary (Last 24 hours) at 01/18/15 1004 Last data filed at 01/18/15 0715  Gross per 24 hour  Intake   2625 ml  Output   3325 ml  Net   -700 ml     LAB RESULTS: CBC  Recent Labs Lab 01/06/2015 1921  01/14/15 0200 01/15/15 0015 01/16/15 0347 01/17/15 0400 01/18/15 0330  WBC 5.9  < > 9.6 4.8 7.0 4.6 2.3*  HGB 11.7*  < > 11.8* 10.1* 8.9* 8.7* 8.0*  HCT 41.5  < > 39.3 32.2* 29.9* 28.3* 26.7*  PLT 219  < > 86* 68* 64* 74* 68*  MCV 115.6*  < > 100.8* 98.2 99.3 98.3 101.9*  MCH 32.6  < > 30.3 30.8 29.6 30.2 30.5  MCHC 28.2*  < > 30.0 31.4 29.8* 30.7 30.0  RDW 21.4*  < > 23.0* 21.7* 21.6* 21.5* 22.0*  LYMPHSABS 0.5*  --   --   --  0.4* 0.5* 0.3*  MONOABS 0.4  --   --   --  0.5 0.4 0.3  EOSABS 0.1  --   --   --  0.1 0.2 0.1  BASOSABS 0.1  --   --   --  0.0 0.0 0.0  < > = values in this interval not displayed.  Chemistries   Recent Labs Lab 01/13/15 0150  01/15/15 0015 01/15/15 0810 01/16/15 0347 01/17/15 0400 01/18/15 0330  NA  --   < > 152* 151* 155* 155* 159*  K  --   < > 3.0* 2.6* 2.9* 3.0* 3.5  CL  --   < > 119* 119* 124* 125* 127*  CO2  --   < > 23 23 22  20* 21*  GLUCOSE  --   < > 105* 122* 97 107* 87  BUN  --   < > 40* 37* 31* 30* 28*  CREATININE 1.66*  < > 1.48* 1.48* 1.49* 1.47* 1.32*  CALCIUM  --   < > 8.0* 7.8* 8.1* 8.1* 8.2*  MG 1.8  --   --   --   --  1.6*  --   < > = values in this interval not displayed.  CBG:  Recent Labs Lab 01/17/15 1456 01/17/15 2025 01/18/15 0002 01/18/15 0510 01/18/15 0811  GLUCAP 91 87 90 79 87    GFR Estimated Creatinine Clearance: 63.6 mL/min (by C-G formula based on Cr of 1.32).  Coagulation profile No results for input(s): INR, PROTIME in the last 168 hours.  Cardiac Enzymes No results for input(s): CKMB, TROPONINI, MYOGLOBIN in the last 168 hours.  Invalid input(s): CK  Invalid input(s): POCBNP No results for input(s): DDIMER in the last 72 hours. No results for input(s): HGBA1C in  the last 72 hours. No results for input(s): CHOL, HDL, LDLCALC, TRIG, CHOLHDL, LDLDIRECT in the last 72 hours. No  results for input(s): TSH, T4TOTAL, T3FREE, THYROIDAB in the last 72 hours.  Invalid input(s): FREET3 No results for input(s): VITAMINB12, FOLATE, FERRITIN, TIBC, IRON, RETICCTPCT in the last 72 hours. No results for input(s): LIPASE, AMYLASE in the last 72 hours.  Urine Studies No results for input(s): UHGB, CRYS in the last 72 hours.  Invalid input(s): UACOL, UAPR, USPG, UPH, UTP, UGL, UKET, UBIL, UNIT, UROB, ULEU, UEPI, UWBC, URBC, UBAC, CAST, UCOM, BILUA  MICROBIOLOGY: Recent Results (from the past 240 hour(s))  Culture, blood (routine x 2)     Status: None   Collection Time: 01/25/2015  2:21 PM  Result Value Ref Range Status   Specimen Description PORTA CATH A-LINE  Final   Special Requests BOTTLES DRAWN AEROBIC AND ANAEROBIC 5ML  Final   Culture NO GROWTH 5 DAYS  Final   Report Status 01/17/2015 FINAL  Final  Culture, blood (routine x 2)     Status: None   Collection Time: 01/13/2015  7:30 PM  Result Value Ref Range Status   Specimen Description BLOOD LEFT HAND  Final   Special Requests PEDIATRICS 2CC  Final   Culture NO GROWTH 5 DAYS  Final   Report Status 01/17/2015 FINAL  Final  Urine culture     Status: None   Collection Time: 01/04/2015  7:44 PM  Result Value Ref Range Status   Specimen Description URINE, RANDOM  Final   Special Requests PEDS BAG  Final   Culture 4,000 COLONIES/mL INSIGNIFICANT GROWTH  Final   Report Status 01/13/2015 FINAL  Final  C difficile quick scan w PCR reflex     Status: None   Collection Time: 01/13/15 12:45 AM  Result Value Ref Range Status   C Diff antigen NEGATIVE NEGATIVE Final   C Diff toxin NEGATIVE NEGATIVE Final   C Diff interpretation Negative for toxigenic C. difficile  Final  Urine culture     Status: None   Collection Time: 01/16/15  4:15 PM  Result Value Ref Range Status   Specimen Description URINE, CATHETERIZED   Final   Special Requests NONE  Final   Culture NO GROWTH 1 DAY  Final   Report Status 01/17/2015 FINAL  Final    RADIOLOGY STUDIES/RESULTS: Ct Pelvis Wo Contrast  01/01/2015  CLINICAL DATA:  Cerebral palsy.  Clinical concern for osteomyelitis. EXAM: CT PELVIS WITHOUT CONTRAST TECHNIQUE: Multidetector CT imaging of the pelvis was performed following the standard protocol without intravenous contrast. COMPARISON:  10/15/2014 CT abdomen/pelvis. FINDINGS: Reproductive: Normal prostate and seminal vesicles. Bladder: Collapsed and grossly normal urinary bladder. Visualized ureters are normal caliber. Bowel: Visualized small and large bowel are normal caliber with no bowel wall thickening. Oral contrast traverses to the distal rectum. Partially visualized is an enteric tube in the distal duodenum and proximal jejunum, with the tip not seen on this study. Vascular/Lymphatic: No pathologically enlarged lymph nodes in the pelvis. Other: No pneumoperitoneum, ascites or focal fluid collection. Musculoskeletal: There is curvilinear gas, fat stranding and ill-defined fluid in the posterior medial left gluteal subcutaneous soft tissues (series 4/ image 125), suggestive of an incompletely visualized decubitus ulcer, with extension of the fat stranding and fluid to the inferior margin of the left ischium. There is mild focal cortical erosion at the inferior left ischium with associated periosteal reaction, in keeping with osteomyelitis. No presacral fluid or fat stranding. No evidence of a sacral decubitus ulcer. Subcutaneous ventral right lower quadrant spinal stimulator device is noted with lead coursing posteriorly and entering the lumbar spinal  canal, with the tip not visualized on this study. No aggressive appearing focal osseous lesions. There is stable bilateral congenital hip dysplasia with shallow acetabula and stable chronic superolateral hip dislocation bilaterally, with stable chronic small underdeveloped  femoral heads bilaterally. There is stable diffuse osteopenia. No osseous fracture. No additional focal cortical erosions or periosteal reaction. Partially visualized is bilateral posterior spinal fusion hardware with posterior bilateral fusion rods and interlocking screws extending into the bilateral iliac wings, with no evidence of hardware fracture or loosening. IMPRESSION: 1. Osteomyelitis of the inferior left ischium associated with curvilinear fat stranding, fluid and gas in the posteromedial left gluteal subcutaneous soft tissues, presumably due to a medial left gluteal decubitus ulcer (which is incompletely visualized on this study). No focal drainable fluid collection. 2. No pelvic ascites or intraperitoneal fluid collection. No presacral fluid or inflammatory change. 3. 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 Electronically Signed   By: Ilona Sorrel M.D.   On: 01/01/2015 10:34   US Abdomen Complete  01/13/2015  CLINICAL DATA:  Acute onset of abdominal distention and sepsis. Initial encounter. EXAM: ULTRASOUND ABDOMEN COMPLETE COMPARISON:  Right upper quadrant ultrasound performed 01/05/2015, and CT of the abdomen and pelvis performed 10/15/2014 FINDINGS: Gallbladder: Stones are noted dependently in the gallbladder. The gallbladder wall is borderline prominent. No pericholecystic fluid is seen. Evaluation for ultrasonographic Murphy's sign is not possible as the patient is unconscious. Common bile duct: Diameter: 0.4 cm, within normal limits in caliber. Liver: No focal lesion identified. Within normal limits in parenchymal echogenicity. Difficult to fully assess due to the midline pain pump. IVC: No abnormality visualized. Pancreas: Not visualized. Spleen: Size and appearance within normal limits. Right Kidney: Length: 9.4 cm. Not fully assessed due to overlying bowel gas. Echogenicity within normal limits. No mass or hydronephrosis visualized. Left Kidney: Not fully  assessed due to overlying bowel gas. Echogenicity within normal limits. No mass or hydronephrosis visualized. Abdominal aorta: Not visualized due to overlying bowel gas. Other findings: None. IMPRESSION: 1. No acute abnormality seen within the abdomen. Evaluation is significantly suboptimal due to bowel gas. 2. Cholelithiasis. Borderline prominent gallbladder wall. Gallbladder otherwise grossly unremarkable. Electronically Signed   By: Garald Balding M.D.   On: 01/13/2015 23:07   Ir Replc Duoden/jejuno Tube Percut W/fluoro  01/01/2015  CLINICAL DATA:  Routine jejunostomy tube exchange EXAM: IR REPLACE DUODEN/JEJUNO TUBE PERCUT WITH FLOURO FLUOROSCOPY TIME:  12 seconds MEDICATIONS AND MEDICAL HISTORY: None ANESTHESIA/SEDATION: None CONTRAST:  5 cc Omnipaque 300 PROCEDURE: The procedure, risks, benefits, and alternatives were explained to the patient. Questions regarding the procedure were encouraged and answered. The patient understands and consents to the procedure. The abdomen was prepped with Betadine in a sterile fashion, and a sterile drape was applied covering the operative field. A sterile gown and sterile gloves were used for the procedure. The existing gastrojejunostomy tube was removed over a stiff glidewire and exchange for a new make jejunostomy tube. The tip was positioned in the proximal jejunum. Contrast was injected. 10 cc saline was utilized to insufflate the balloon. FINDINGS: The jejunostomy tube is been exchange. Tip is in the proximal jejunum. COMPLICATIONS: None IMPRESSION: Successful jejunostomy tube exchange. Electronically Signed   By: Marybelle Killings M.D.   On: 01/01/2015 12:18   Dg Chest Port 1 View  01/16/2015  CLINICAL DATA:  Shortness of breath. EXAM: PORTABLE CHEST 1 VIEW COMPARISON:  January 15, 2015. FINDINGS: Hypoinflation of the lungs is noted. Stable cardiomediastinal silhouette. Status post surgical fusion  of the thoracic and lumbar spine. Mild to moderate bibasilar  opacities are noted concerning for subsegmental atelectasis or possibly pneumonia. No pneumothorax is noted. No significant pleural effusion is noted. IMPRESSION: Mild to moderate bibasilar opacities are noted concerning for subsegmental atelectasis or pneumonia. Hypoinflation of the lungs is again noted. Electronically Signed   By: Marijo Conception, M.D.   On: 01/16/2015 18:50   Dg Chest Port 1 View  01/15/2015  CLINICAL DATA:  Pneumonia. EXAM: PORTABLE CHEST 1 VIEW COMPARISON:  01/23/2015 FINDINGS: Chronic low lung volumes. Increased densities at the left lung base are concerning for worsening atelectasis or infection. Stable appearance of the heart and mediastinum. Port-A-Cath in the SVC region. Stable appearance of the spinal surgical hardware. IMPRESSION: Increased densities in the left lower lobe are concerning for worsening atelectasis or infection. Chronic low lung volumes. Electronically Signed   By: Markus Daft M.D.   On: 01/15/2015 13:02   Dg Chest Portable 1 View  02/01/2015  CLINICAL DATA:  Fever. EXAM: PORTABLE CHEST 1 VIEW COMPARISON:  December 3rd 2016. FINDINGS: Stable chronic low lung volumes are noted. Stable surgical fusion of thoraco lumbar spine is noted. No pneumothorax is noted. Increased bibasilar linear densities are noted most consistent with worsening subsegmental atelectasis or possibly pneumonia. Cardiomediastinal silhouette is unchanged. IMPRESSION: Stable chronic low lung volumes are noted. Increased bibasilar linear densities are noted most consistent with worsening subsegmental atelectasis or possibly pneumonia. Electronically Signed   By: Marijo Conception, M.D.   On: 01/25/2015 20:01   Dg Chest Port 1 View  01/04/2015  CLINICAL DATA:  Shortness of breath and fever.  Recent pneumonia. EXAM: PORTABLE CHEST 1 VIEW COMPARISON:  01/01/2015 and 12/31/2014. FINDINGS: 0814 hours. There are chronic low lung volumes. The patient's mandible overlies the right lung apex. Chronic  bibasilar atelectasis or scarring is unchanged. There is no airspace disease, edema or pleural effusion. Right subclavian Port-A-Cath appears unchanged. The bones appear unchanged status post thoracolumbar fusion. IMPRESSION: No significant change in chronic bibasilar atelectasis or scarring. No acute findings demonstrated. Electronically Signed   By: Richardean Sale M.D.   On: 01/04/2015 10:29   Portable Chest 1 View  01/01/2015  CLINICAL DATA:  Shortness of breath, cough, dyspnea.  Cerebral palsy EXAM: PORTABLE CHEST 1 VIEW COMPARISON:  Yesterday FINDINGS: Porta catheter on the right with tip in good position at the SVC level. Persistent hypoventilation and linear opacities at the bases. There is no edema, consolidation, effusion, or pneumothorax. Normal heart size and mediastinal contours. Extensive spinal fixation. IMPRESSION: Chronic hypoventilation with basilar atelectasis or scarring. Electronically Signed   By: Monte Fantasia M.D.   On: 01/01/2015 07:40   Dg Chest Port 1 View  12/31/2014  CLINICAL DATA:  Chest pain recent pneumonia persistent fever for 1 week EXAM: PORTABLE CHEST 1 VIEW COMPARISON:  10/19/2014 FINDINGS: Spinal rods noted. Port-A-Cath stable on the right. Low lung volumes with bibasilar atelectasis. Heart size normal. IMPRESSION: Mild bilateral lower lobe atelectasis. No definite evidence of pneumonia. Subtle infiltrate could be due obscured by the bilateral lower lobe atelectasis however. Electronically Signed   By: Skipper Cliche M.D.   On: 12/31/2014 17:39   Dg Abd Portable 1v  01/15/2015  CLINICAL DATA:  Abdominal distension. EXAM: PORTABLE ABDOMEN - 1 VIEW COMPARISON:  10/15/2014 FINDINGS: Spinal hardware throughout the thoracolumbar spine extending into the pelvis. Chronic changes involving the pelvic bones and hips. There is a GJ feeding tube and the tip is probably in the jejunal  region. Again noted is an implant infusion device in the right lower abdomen. Few gas-filled  loops of bowel but nonobstructive pattern. IMPRESSION: No acute abnormality. Electronically Signed   By: Markus Daft M.D.   On: 01/15/2015 13:08   US Abdomen Limited Ruq  01/05/2015  CLINICAL DATA:  Elevated liver function tests. Cerebral palsy. Feeding tube and implanted pain pump in the right upper quadrant of the abdomen overlying the gallbladder area. EXAM: US ABDOMEN LIMITED - RIGHT UPPER QUADRANT COMPARISON:  Limited abdomen ultrasound dated 11/06/2014. Abdomen CT dated 10/15/2014. FINDINGS: Gallbladder: Multiple gallstones in the gallbladder measuring up to 7 mm in maximum diameter each. No gallbladder wall thickening or pericholecystic fluid. No apparent pain over the gallbladder. Common bile duct: Diameter: 3.8 mm Liver: Normal echogenicity.  No mass or biliary ductal dilatation seen. IMPRESSION: 1. Cholelithiasis without evidence cholecystitis. 2. Otherwise, unremarkable examination. Electronically Signed   By: Claudie Revering M.D.   On: 01/05/2015 15:59    Oren Binet, MD  [facility] Pager:[phone]  If 7PM-7AM, please contact night-coverage [email or web address] Password [email or web address] 01/18/2015, 10:04 AM   LOS: 6 days

## 2015-01-18 NOTE — Progress Notes (Signed)
UR COMPLETED  

## 2015-01-18 NOTE — Progress Notes (Signed)
Jeffery [name] is not looking as good. He is having quite a bit of diarrhea now. His abdomen is distended. When I listen to his abdomen, I heard very few bowel sounds. I just worry about him developing an ileus. This certainly would explain the diarrhea. I wonder if the ileus might be a byproduct of the hypernatremia.  His sodium continues to go up. He is on IV fluids.  His CBC shows his white cell count down to 2.3. His hemoglobin was down to 8. His blood counts down to 68,000. As usual, whenever his body is under stress, his marrow tends to shut down. I would go ahead and transfuse him 1 unit for the hemoglobin of 8. He will get a dose of Neupogen.  He continues on the IV antibiotics. So far, his cultures are all negative.  On his exam, his temperature is 90.9 degrees. Pulse is 102. His blood pressures 107/66. His lungs show some slight crackles at the bases. He has decent air movement bilaterally. Cardiac exam tachycardic but regular. Heart no murmurs rubs or bruits. Abdomen shows some distention. Bowel sounds are decreased. There is no obvious tenderness. Extremities shows no clubbing, cyanosis or edema. Skin exam shows no rashes, ecchymoses or petechia.  From my point of view, I think the real problem that he has is the hypernatremia. I think that he clearly has some derangement with his metabolic controls. I suppose he could have hypernatremia from all the diarrhea. I probably would check his stool for C. difficile.  I think that anything he is fed through his feeding tube probably will discover right through him. One might have to think about feeding him with TNA.  I spoke to his dad at length. I told him that the doctors were doing as much as they could right now. For now, I think the real "blocking point" is the hypernatremia.  I will get a abdominal x-ray on him to see if he has an ileus. It would not surprise me. With the diarrhea, that would make sense.  He will get 1 unit of blood.  He will  get a dose of Neupogen.  We will continue to try to help as much as possible.  Frederich Cha 1:5

## 2015-01-18 NOTE — Progress Notes (Signed)
Father at bedside, father brought pt's lift and wheelchair and we were able to get pt up into chair. Pt's father went through pt's medications and decided which medications pt could have this am. Documented not given on medications pt's father refused to have administered. Consuelo Pandy RN

## 2015-01-19 ENCOUNTER — Inpatient Hospital Stay (HOSPITAL_COMMUNITY): Payer: Medicaid Other

## 2015-01-19 LAB — C DIFFICILE QUICK SCREEN W PCR REFLEX
C DIFFICILE (CDIFF) TOXIN: NEGATIVE
C Diff antigen: NEGATIVE
C Diff interpretation: NEGATIVE

## 2015-01-19 LAB — GLUCOSE, CAPILLARY
GLUCOSE-CAPILLARY: 74 mg/dL (ref 65–99)
Glucose-Capillary: 72 mg/dL (ref 65–99)
Glucose-Capillary: 79 mg/dL (ref 65–99)
Glucose-Capillary: 85 mg/dL (ref 65–99)
Glucose-Capillary: 85 mg/dL (ref 65–99)

## 2015-01-19 LAB — COMPREHENSIVE METABOLIC PANEL
ALK PHOS: 469 U/L — AB (ref 38–126)
ALT: 20 U/L (ref 17–63)
AST: 19 U/L (ref 15–41)
Albumin: 1.5 g/dL — ABNORMAL LOW (ref 3.5–5.0)
Anion gap: 9 (ref 5–15)
BUN: 23 mg/dL — AB (ref 6–20)
CALCIUM: 8.2 mg/dL — AB (ref 8.9–10.3)
CHLORIDE: 124 mmol/L — AB (ref 101–111)
CO2: 19 mmol/L — AB (ref 22–32)
CREATININE: 1.21 mg/dL (ref 0.61–1.24)
GFR calc Af Amer: >60 mL/min (ref >60)
GFR calc non Af Amer: >60 mL/min (ref >60)
Glucose, Bld: 80 mg/dL (ref 65–99)
Potassium: 3.6 mmol/L (ref 3.5–5.1)
SODIUM: 152 mmol/L — AB (ref 135–145)
Total Bilirubin: 0.4 mg/dL (ref 0.3–1.2)
Total Protein: 4 g/dL — ABNORMAL LOW (ref 6.5–8.1)

## 2015-01-19 LAB — TYPE AND SCREEN
ABO/RH(D): O POS
Antibody Screen: NEGATIVE
Unit division: 0

## 2015-01-19 LAB — CBC WITH DIFFERENTIAL/PLATELET
BASOS ABS: 0.1 10*3/uL (ref 0.0–0.1)
BASOS PCT: 2 %
EOS ABS: 0.1 10*3/uL (ref 0.0–0.7)
Eosinophils Relative: 2 %
HCT: 30.9 % — ABNORMAL LOW (ref 39.0–52.0)
HEMOGLOBIN: 9.4 g/dL — AB (ref 13.0–17.0)
LYMPHS PCT: 9 %
Lymphs Abs: 0.4 10*3/uL — ABNORMAL LOW (ref 0.7–4.0)
MCH: 30.5 pg (ref 26.0–34.0)
MCHC: 30.4 g/dL (ref 30.0–36.0)
MCV: 100.3 fL — AB (ref 78.0–100.0)
MONOS PCT: 8 %
Monocytes Absolute: 0.3 10*3/uL (ref 0.1–1.0)
NEUTROS ABS: 3.3 10*3/uL (ref 1.7–7.7)
NEUTROS PCT: 79 %
PLATELETS: 72 10*3/uL — AB (ref 150–400)
RBC: 3.08 MIL/uL — ABNORMAL LOW (ref 4.22–5.81)
RDW: 21 % — ABNORMAL HIGH (ref 11.5–15.5)
WBC: 4.2 10*3/uL (ref 4.0–10.5)

## 2015-01-19 MED ORDER — POTASSIUM CHLORIDE 20 MEQ/15ML (10%) PO SOLN
40.0000 meq | Freq: Two times a day (BID) | ORAL | Status: DC
  Administered 2015-01-19 (×2): 40 meq
  Filled 2015-01-19 (×2): qty 30

## 2015-01-19 NOTE — Progress Notes (Signed)
PATIENT DETAILS Name: Jeffery [name] Age: 24 y.o. Sex: male Date of Birth: 1990/07/20 Admit Date: 01/18/2015 Admitting Physician Edwin Dada, MD [id number], DAVID C, MD  Subjective: Up in wheelchair. Appears weak-but not in distress. Awake this am  Assessment/Plan: Principal Problem: Severe sepsis: Likely secondary to aspiration pneumonia, but also has ongoing osteomyelitis of the sacrum. Sepsis pathophysiology has resolved. Afebrile now for past few days. ID following-the recommendations are to continue with ceftaroline/flagyl till Feb 11 2015.  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  Acute on chronic hypoxic respiratory failure: Hospital course complicated by episodes of acute hypoxic respiratory failure from aspiration pneumonitis (vomiting/pooling of secretions). Family aware that given repeated acute illness-patient is very deconditioned and weaker that usual-which is likely further exacerbating aspiration episodes. Thankfully did not have any further episodes on 12/17, we will continue BiPAP support/suctioning prn-family has refused to meet with Palliative care. After discussion with family on 12/16-plans are to continue with current care and re-assess on 12/19-but family hopeful of eventual discharge home with IV Abx. DNR remains in place.  Presumed aspiration pneumonia: See above. Has a long-standing issue with pooling of secretions-but seems to have had more frequent episodes over the past few days. Continue when necessary Robinul, suctioning, vibrator vest, bronchodilators etc. see above  Hypernatremia: Suspect secondary to dehydration from sepsis, inability to start tube feeds due to aspiration/resp  failure and diarrhea. Slowly improving, Continue IV fluids at 125 mL an hour  Hypokalemia: Replete again today-will try to maintain >4 given possibility of mild ileus on Xray 12/17.Likely secondary to diarrhea.  ARF: Likely prerenal azotemia in the setting of sepsis, slowly improving.  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 Mother reports that LFTs worsens after he gets Neupogen. RUQ ultrasound negative for hepatobiliary pathology  Anemia/Leukopenia: Chronic issue-attributed to bone marrow suppression from Keppra and other medications. Oncology/Hematology following.Received one 1 unit of PRBC and Neupogen 12/17  Diarrhea:? Secondary to antibiotics. C. difficile PCR X 2 negative. Supportive care-will attempt to see if better with scheduled Imodium.Stop Reglan-await repeat Xray today-may need to stop Imodium  Sacral decubitus stage IV: Present prior to [facility] care following.  Dysphagia requring chronic J tube feeds: Continue regular tube feeds-is NPO. See above regarding aspiration  History of seizures:Continue Onfi and Neurontin.Ensure follow up with Dr Gaynell Face  Hx of cerebral palsy/spastic quadriplegic: Has a intrathecal baclofen pump in place.   [id number] to neurologic issues-on BIPAP Qhs  Palliative care:DNR in place-unfortunate 24 year old with cerebral palsy and spastic quadriplegia-history of seizures-now with recurrent admissions for sepsis from aspiration pneumonia and  ongoing sacral osteomyelitis. Family aware that not a candidate for further escalation of care-goal is to try and manage this situation with antibiotics as much as possible. Unfortunately, hospital course complicated by development of aspiration pneumonitis and acute respiratory failure-long discussion with family-mother over the phone on 12/16, and father at bedside-suggested initiation of hospice/comfort care at some point in the in the next few days-family  requesting that we continue with current measures over the weekend and reassess on Monday.Family has refused to meet with Palliative care. DNR in place.    Disposition: Remain inpatient-remain in SDU  Antimicrobial agents  See below  Anti-infectives    Start  Dose/Rate Route Frequency Ordered Stop   01/14/15 1200  cefTAZidime (FORTAZ) 2 g in dextrose 5 % 50 mL IVPB  Status:  Discontinued     2 g 100 mL/hr over 30 Minutes Intravenous Every 24 hours 01/13/15 1344 01/13/15 1553   01/13/15 1800  ceftaroline (TEFLARO) 300 mg in sodium chloride 0.9 % 250 mL IVPB     300 mg 250 mL/hr over 60 Minutes Intravenous Every 12 hours 01/13/15 1558     01/13/15 1700  ceftaroline (TEFLARO) 300 mg in sodium chloride 0.9 % 250 mL IVPB  Status:  Discontinued     300 mg 250 mL/hr over 60 Minutes Intravenous Every 12 hours 01/13/15 1557 01/13/15 1558   01/13/15 1600  metroNIDAZOLE (FLAGYL) IVPB 500 mg     500 mg 100 mL/hr over 60 Minutes Intravenous Every 8 hours 01/13/15 1554     01/13/15 0845  linezolid (ZYVOX) IVPB 600 mg  Status:  Discontinued     600 mg 300 mL/hr over 60 Minutes Intravenous Every 12 hours 01/11/2015 2345 01/26/2015 2350   01/04/2015 2200  linezolid (ZYVOX) IVPB 600 mg  Status:  Discontinued     600 mg 300 mL/hr over 60 Minutes Intravenous Every 12 hours 01/18/2015 2020 01/13/15 1532   01/23/2015 2200  cefTAZidime (FORTAZ) 2 g in dextrose 5 % 50 mL IVPB  Status:  Discontinued     2 g 100 mL/hr over 30 Minutes Intravenous Every 12 hours 01/30/2015 2034 01/13/15 1344      DVT Prophylaxis: SCD's  Code Status:  DNR  Family Communication Father at bedside  Procedures: None  CONSULTS:  pulmonary/intensive care, ID and hematology/oncology  Time spent 35 minutes-Greater than 50% of this time was spent in counseling, explanation of diagnosis, planning of further management, and coordination of care.  MEDICATIONS: Scheduled Meds: . acetaminophen  650 mg Oral Once  . antiseptic  oral rinse  7 mL Mouth Rinse q12n4p  . ceFTAROline (TEFLARO) IV  300 mg Intravenous Q12H  . chlorhexidine  15 mL Mouth Rinse BID  . cloBAZam  3.75 mg Per Tube TID AC  . [facility] Beanaid  2 capsule Per J Tube TID  . feeding supplement (PRO-STAT SUGAR FREE 64)  30 mL Per Tube TID  . gabapentin  900 mg Per Tube 3 times per day  . levalbuterol  0.63 mg Nebulization Q6H  . loperamide  2 mg Per Tube TID  . metronidazole  500 mg Intravenous Q8H  . pantoprazole sodium  40 mg Per Tube BID  . potassium chloride  40 mEq Per Tube BID  . sodium chloride  3 mL Intravenous Q12H   Continuous Infusions: . baclofen    . dextrose 5 % 1,000 mL with potassium chloride 20 mEq infusion 125 mL/hr at 01/19/15 0800  . TWOCAL HN 237 mL (01/17/15 1000)   PRN Meds:.acetaminophen (TYLENOL) oral liquid 160 mg/5 mL **OR** acetaminophen, dextrose, dicyclomine, glycopyrrolate, ibuprofen, ketoconazole, levalbuterol, zinc oxide    PHYSICAL EXAM: Vital signs in last 24 hours: Filed Vitals:   01/19/15 0605 01/19/15 0800 01/19/15 0821 01/19/15 1107  BP:    116/65  Pulse:    111  Temp:  99.4 F (37.4 C)    TempSrc:  Rectal    Resp:    15  Height:      Weight:      SpO2: 98%  97% 99%    Weight change:  Filed Weights   01/07/2015 2341  Weight: 58.8 kg (129 lb 10.1  oz)   Body mass index is 26.17 kg/(m^2).  Gen Exam: On [place] this am.Up in wheelchair-awake. Not in any distress Neck: Supple Chest: Some transmitted upper airway sounds CVS: S1 S2 Regular, no murmurs.  Abdomen: soft, mildly distended. G-tube in place Extremities: no edema, lower extremities warm to touch. Neurologic: Decreased bulk-0 X 5 in all 4 ext   Intake/Output from previous day:  Intake/Output Summary (Last 24 hours) at 01/19/15 1145 Last data filed at 01/19/15 1107  Gross per 24 hour  Intake 4753.75 ml  Output   3575 ml  Net 1178.75 ml     LAB RESULTS: CBC  Recent Labs Lab 01/02/2015 1921  01/15/15 0015 01/16/15 0347  01/17/15 0400 01/18/15 0330 01/19/15 0310  WBC 5.9  < > 4.8 7.0 4.6 2.3* 4.2  HGB 11.7*  < > 10.1* 8.9* 8.7* 8.0* 9.4*  HCT 41.5  < > 32.2* 29.9* 28.3* 26.7* 30.9*  PLT 219  < > 68* 64* 74* 68* 72*  MCV 115.6*  < > 98.2 99.3 98.3 101.9* 100.3*  MCH 32.6  < > 30.8 29.6 30.2 30.5 30.5  MCHC 28.2*  < > 31.4 29.8* 30.7 30.0 30.4  RDW 21.4*  < > 21.7* 21.6* 21.5* 22.0* 21.0*  LYMPHSABS 0.5*  --   --  0.4* 0.5* 0.3* 0.4*  MONOABS 0.4  --   --  0.5 0.4 0.3 0.3  EOSABS 0.1  --   --  0.1 0.2 0.1 0.1  BASOSABS 0.1  --   --  0.0 0.0 0.0 0.1  < > = values in this interval not displayed.  Chemistries   Recent Labs Lab 01/13/15 0150  01/15/15 0810 01/16/15 0347 01/17/15 0400 01/18/15 0330 01/19/15 0310  NA  --   < > 151* 155* 155* 159* 152*  K  --   < > 2.6* 2.9* 3.0* 3.5 3.6  CL  --   < > 119* 124* 125* 127* 124*  CO2  --   < > 23 22 20* 21* 19*  GLUCOSE  --   < > 122* 97 107* 87 80  BUN  --   < > 37* 31* 30* 28* 23*  CREATININE 1.66*  < > 1.48* 1.49* 1.47* 1.32* 1.21  CALCIUM  --   < > 7.8* 8.1* 8.1* 8.2* 8.2*  MG 1.8  --   --   --  1.6*  --   --   < > = values in this interval not displayed.  CBG:  Recent Labs Lab 01/18/15 1459 01/18/15 2102 01/19/15 0016 01/19/15 0445 01/19/15 0814  GLUCAP 73 76 85 72 79    GFR Estimated Creatinine Clearance: 69.4 mL/min (by C-G formula based on Cr of 1.21).  Coagulation profile No results for input(s): INR, PROTIME in the last 168 hours.  Cardiac Enzymes No results for input(s): CKMB, TROPONINI, MYOGLOBIN in the last 168 hours.  Invalid input(s): CK  Invalid input(s): POCBNP No results for input(s): DDIMER in the last 72 hours. No results for input(s): HGBA1C in the last 72 hours. No results for input(s): CHOL, HDL, LDLCALC, TRIG, CHOLHDL, LDLDIRECT in the last 72 hours. No results for input(s): TSH, T4TOTAL, T3FREE, THYROIDAB in the last 72 hours.  Invalid input(s): FREET3 No results for input(s): VITAMINB12, FOLATE,  FERRITIN, TIBC, IRON, RETICCTPCT in the last 72 hours. No results for input(s): LIPASE, AMYLASE in the last 72 hours.  Urine Studies No results for input(s): UHGB, CRYS in the last 72 hours.  Invalid input(s):  UACOL, UAPR, USPG, UPH, UTP, UGL, UKET, UBIL, UNIT, UROB, ULEU, UEPI, UWBC, URBC, UBAC, CAST, UCOM, BILUA  MICROBIOLOGY: Recent Results (from the past 240 hour(s))  Culture, blood (routine x 2)     Status: None   Collection Time: 01/04/2015  2:21 PM  Result Value Ref Range Status   Specimen Description PORTA CATH A-LINE  Final   Special Requests BOTTLES DRAWN AEROBIC AND ANAEROBIC 5ML  Final   Culture NO GROWTH 5 DAYS  Final   Report Status 01/17/2015 FINAL  Final  Culture, blood (routine x 2)     Status: None   Collection Time: 01/02/2015  7:30 PM  Result Value Ref Range Status   Specimen Description BLOOD LEFT HAND  Final   Special Requests PEDIATRICS 2CC  Final   Culture NO GROWTH 5 DAYS  Final   Report Status 01/17/2015 FINAL  Final  Urine culture     Status: None   Collection Time: 01/16/2015  7:44 PM  Result Value Ref Range Status   Specimen Description URINE, RANDOM  Final   Special Requests PEDS BAG  Final   Culture 4,000 COLONIES/mL INSIGNIFICANT GROWTH  Final   Report Status 01/13/2015 FINAL  Final  C difficile quick scan w PCR reflex     Status: None   Collection Time: 01/13/15 12:45 AM  Result Value Ref Range Status   C Diff antigen NEGATIVE NEGATIVE Final   C Diff toxin NEGATIVE NEGATIVE Final   C Diff interpretation Negative for toxigenic C. difficile  Final  Urine culture     Status: None   Collection Time: 01/16/15  4:15 PM  Result Value Ref Range Status   Specimen Description URINE, CATHETERIZED  Final   Special Requests NONE  Final   Culture NO GROWTH 1 DAY  Final   Report Status 01/17/2015 FINAL  Final  Culture, respiratory (NON-Expectorated)     Status: None (Preliminary result)   Collection Time: 01/17/15 11:55 AM  Result Value Ref Range Status    Specimen Description ASPIRATE  Final   Special Requests NASAL ASPIRATE Immunocompromised  Final   Gram Stain PENDING  Incomplete   Culture   Final    MODERATE GRAM NEGATIVE RODS Performed at [facility]    Report Status PENDING  Incomplete  C difficile quick scan w PCR reflex     Status: None   Collection Time: 01/19/15  7:40 AM  Result Value Ref Range Status   C Diff antigen NEGATIVE NEGATIVE Final   C Diff toxin NEGATIVE NEGATIVE Final   C Diff interpretation Negative for toxigenic C. difficile  Final    RADIOLOGY STUDIES/RESULTS: Ct Pelvis Wo Contrast  01/01/2015  CLINICAL DATA:  Cerebral palsy.  Clinical concern for osteomyelitis. EXAM: CT PELVIS WITHOUT CONTRAST TECHNIQUE: Multidetector CT imaging of the pelvis was performed following the standard protocol without intravenous contrast. COMPARISON:  10/15/2014 CT abdomen/pelvis. FINDINGS: Reproductive: Normal prostate and seminal vesicles. Bladder: Collapsed and grossly normal urinary bladder. Visualized ureters are normal caliber. Bowel: Visualized small and large bowel are normal caliber with no bowel wall thickening. Oral contrast traverses to the distal rectum. Partially visualized is an enteric tube in the distal duodenum and proximal jejunum, with the tip not seen on this study. Vascular/Lymphatic: No pathologically enlarged lymph nodes in the pelvis. Other: No pneumoperitoneum, ascites or focal fluid collection. Musculoskeletal: There is curvilinear gas, fat stranding and ill-defined fluid in the posterior medial left gluteal subcutaneous soft tissues (series 4/ image 125), suggestive of an  incompletely visualized decubitus ulcer, with extension of the fat stranding and fluid to the inferior margin of the left ischium. There is mild focal cortical erosion at the inferior left ischium with associated periosteal reaction, in keeping with osteomyelitis. No presacral fluid or fat stranding. No evidence of a sacral decubitus ulcer.  Subcutaneous ventral right lower quadrant spinal stimulator device is noted with lead coursing posteriorly and entering the lumbar spinal canal, with the tip not visualized on this study. No aggressive appearing focal osseous lesions. 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 There is stable diffuse osteopenia. No osseous fracture. No additional focal cortical erosions or periosteal reaction. Partially visualized is bilateral posterior spinal fusion hardware with posterior bilateral fusion rods and interlocking screws extending into the bilateral iliac wings, with no evidence of hardware fracture or loosening. IMPRESSION: 1. Osteomyelitis of the inferior left ischium associated with curvilinear fat stranding, fluid and gas in the posteromedial left gluteal subcutaneous soft tissues, presumably due to a medial left gluteal decubitus ulcer (which is incompletely visualized on this study). No focal drainable fluid collection. 2. No pelvic ascites or intraperitoneal fluid collection. No presacral fluid or inflammatory change. 3. 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 Electronically Signed   By: Ilona Sorrel M.D.   On: 01/01/2015 10:34   US Abdomen Complete  01/13/2015  CLINICAL DATA:  Acute onset of abdominal distention and sepsis. Initial encounter. EXAM: ULTRASOUND ABDOMEN COMPLETE COMPARISON:  Right upper quadrant ultrasound performed 01/05/2015, and CT of the abdomen and pelvis performed 10/15/2014 FINDINGS: Gallbladder: Stones are noted dependently in the gallbladder. The gallbladder wall is borderline prominent. No pericholecystic fluid is seen. Evaluation for ultrasonographic Murphy's sign is not possible as the patient is unconscious. Common bile duct: Diameter: 0.4 cm, within normal limits in caliber. Liver: No  focal lesion identified. Within normal limits in parenchymal echogenicity. Difficult to fully assess due to the midline pain pump. IVC: No abnormality visualized. Pancreas: Not visualized. Spleen: Size and appearance within normal limits. Right Kidney: Length: 9.4 cm. Not fully assessed due to overlying bowel gas. Echogenicity within normal limits. No mass or hydronephrosis visualized. Left Kidney: Not fully assessed due to overlying bowel gas. Echogenicity within normal limits. No mass or hydronephrosis visualized. Abdominal aorta: Not visualized due to overlying bowel gas. Other findings: None. IMPRESSION: 1. No acute abnormality seen within the abdomen. Evaluation is significantly suboptimal due to bowel gas. 2. Cholelithiasis. Borderline prominent gallbladder wall. Gallbladder otherwise grossly unremarkable. Electronically Signed   By: Garald Balding M.D.   On: 01/13/2015 23:07   Ir Replc Duoden/jejuno Tube Percut W/fluoro  01/01/2015  CLINICAL DATA:  Routine jejunostomy tube exchange EXAM: IR REPLACE DUODEN/JEJUNO TUBE PERCUT WITH FLOURO FLUOROSCOPY TIME:  12 seconds MEDICATIONS AND MEDICAL HISTORY: None ANESTHESIA/SEDATION: None CONTRAST:  5 cc Omnipaque 300 PROCEDURE: The procedure, risks, benefits, and alternatives were explained to the patient. Questions regarding the procedure were encouraged and answered. The patient understands and consents to the procedure. The abdomen was prepped with Betadine in a sterile fashion, and a sterile drape was applied covering the operative field. A sterile gown and sterile gloves were used for the procedure. The existing gastrojejunostomy tube was removed over a stiff glidewire and exchange for a new make jejunostomy tube. The tip was positioned in the proximal jejunum. Contrast was injected. 10 cc saline was utilized to insufflate the balloon. FINDINGS: The jejunostomy  tube is been exchange. Tip is in the proximal jejunum. COMPLICATIONS: None IMPRESSION: Successful  jejunostomy tube exchange. Electronically Signed   By: Marybelle Killings M.D.   On: 01/01/2015 12:18   Dg Chest Port 1 View  01/16/2015  CLINICAL DATA:  Shortness of breath. EXAM: PORTABLE CHEST 1 VIEW COMPARISON:  January 15, 2015. FINDINGS: Hypoinflation of the lungs is noted. Stable cardiomediastinal silhouette. Status post surgical fusion of the thoracic and lumbar spine. Mild to moderate bibasilar opacities are noted concerning for subsegmental atelectasis or possibly pneumonia. No pneumothorax is noted. No significant pleural effusion is noted. IMPRESSION: Mild to moderate bibasilar opacities are noted concerning for subsegmental atelectasis or pneumonia. Hypoinflation of the lungs is again noted. Electronically Signed   By: Marijo Conception, M.D.   On: 01/16/2015 18:50   Dg Chest Port 1 View  01/15/2015  CLINICAL DATA:  Pneumonia. EXAM: PORTABLE CHEST 1 VIEW COMPARISON:  01/19/2015 FINDINGS: Chronic low lung volumes. Increased densities at the left lung base are concerning for worsening atelectasis or infection. Stable appearance of the heart and mediastinum. Port-A-Cath in the SVC region. Stable appearance of the spinal surgical hardware. IMPRESSION: Increased densities in the left lower lobe are concerning for worsening atelectasis or infection. Chronic low lung volumes. Electronically Signed   By: Markus Daft M.D.   On: 01/15/2015 13:02   Dg Chest Portable 1 View  01/14/2015  CLINICAL DATA:  Fever. EXAM: PORTABLE CHEST 1 VIEW COMPARISON:  December 3rd 2016. FINDINGS: Stable chronic low lung volumes are noted. Stable surgical fusion of thoraco lumbar spine is noted. No pneumothorax is noted. Increased bibasilar linear densities are noted most consistent with worsening subsegmental atelectasis or possibly pneumonia. Cardiomediastinal silhouette is unchanged. IMPRESSION: Stable chronic low lung volumes are noted. Increased bibasilar linear densities are noted most consistent with worsening subsegmental  atelectasis or possibly pneumonia. Electronically Signed   By: Marijo Conception, M.D.   On: 01/06/2015 20:01   Dg Chest Port 1 View  01/04/2015  CLINICAL DATA:  Shortness of breath and fever.  Recent pneumonia. EXAM: PORTABLE CHEST 1 VIEW COMPARISON:  01/01/2015 and 12/31/2014. FINDINGS: 0814 hours. There are chronic low lung volumes. The patient's mandible overlies the right lung apex. Chronic bibasilar atelectasis or scarring is unchanged. There is no airspace disease, edema or pleural effusion. Right subclavian Port-A-Cath appears unchanged. The bones appear unchanged status post thoracolumbar fusion. IMPRESSION: No significant change in chronic bibasilar atelectasis or scarring. No acute findings demonstrated. Electronically Signed   By: Richardean Sale M.D.   On: 01/04/2015 10:29   Portable Chest 1 View  01/01/2015  CLINICAL DATA:  Shortness of breath, cough, dyspnea.  Cerebral palsy EXAM: PORTABLE CHEST 1 VIEW COMPARISON:  Yesterday FINDINGS: Porta catheter on the right with tip in good position at the SVC level. Persistent hypoventilation and linear opacities at the bases. There is no edema, consolidation, effusion, or pneumothorax. Normal heart size and mediastinal contours. Extensive spinal fixation. IMPRESSION: Chronic hypoventilation with basilar atelectasis or scarring. Electronically Signed   By: Monte Fantasia M.D.   On: 01/01/2015 07:40   Dg Chest Port 1 View  12/31/2014  CLINICAL DATA:  Chest pain recent pneumonia persistent fever for 1 week EXAM: PORTABLE CHEST 1 VIEW COMPARISON:  10/19/2014 FINDINGS: Spinal rods noted. Port-A-Cath stable on the right. Low lung volumes with bibasilar atelectasis. Heart size normal. IMPRESSION: Mild bilateral lower lobe atelectasis. No definite evidence of pneumonia. Subtle infiltrate could be due obscured by the bilateral lower lobe atelectasis  however. Electronically Signed   By: Skipper Cliche M.D.   On: 12/31/2014 17:39   Dg Abd Portable  1v  01/18/2015  CLINICAL DATA:  Gaseous abdominal distention EXAM: PORTABLE ABDOMEN - 1 VIEW COMPARISON:  01/15/2015 FINDINGS: Mild gas distention of ascending and transverse colon. Transverse colon measures up to 8.9 cm diameter. Scattered gas remainder of abdomen including sigmoid colon. No definite bowel wall thickening. Extensive prior spinal fixation anchored into pelvis. Osseous demineralization. Jejunostomy through gastrostomy tube again noted. RIGHT lower quadrant infusion pump with catheter extending to spine. IMPRESSION: Mild gas distention of the proximal half of colon known significant gas is also seen in the sigmoid colon, question ileus. Electronically Signed   By: Lavonia Dana M.D.   On: 01/18/2015 12:36   Dg Abd Portable 1v  01/15/2015  CLINICAL DATA:  Abdominal distension. EXAM: PORTABLE ABDOMEN - 1 VIEW COMPARISON:  10/15/2014 FINDINGS: Spinal hardware throughout the thoracolumbar spine extending into the pelvis. Chronic changes involving the pelvic bones and hips. There is a GJ feeding tube and the tip is probably in the jejunal region. Again noted is an implant infusion device in the right lower abdomen. Few gas-filled loops of bowel but nonobstructive pattern. IMPRESSION: No acute abnormality. Electronically Signed   By: Markus Daft M.D.   On: 01/15/2015 13:08   US Abdomen Limited Ruq  01/05/2015  CLINICAL DATA:  Elevated liver function tests. Cerebral palsy. Feeding tube and implanted pain pump in the right upper quadrant of the abdomen overlying the gallbladder area. EXAM: US ABDOMEN LIMITED - RIGHT UPPER QUADRANT COMPARISON:  Limited abdomen ultrasound dated 11/06/2014. Abdomen CT dated 10/15/2014. FINDINGS: Gallbladder: Multiple gallstones in the gallbladder measuring up to 7 mm in maximum diameter each. No gallbladder wall thickening or pericholecystic fluid. No apparent pain over the gallbladder. Common bile duct: Diameter: 3.8 mm Liver: Normal echogenicity.  No mass or biliary  ductal dilatation seen. IMPRESSION: 1. Cholelithiasis without evidence cholecystitis. 2. Otherwise, unremarkable examination. Electronically Signed   By: Claudie Revering M.D.   On: 01/05/2015 15:59    Oren Binet, MD  [facility] Pager:[phone]  If 7PM-7AM, please contact night-coverage [email or web address] Password [email or web address] 01/19/2015, 11:45 AM   LOS: 7 days

## 2015-01-19 NOTE — Progress Notes (Signed)
Pt's o2 sats are 78%, pt changed to bi pap. RT, Brandy, called. O2 sats increased to 88% with bi pap. Consuelo Pandy RN

## 2015-01-19 NOTE — Progress Notes (Signed)
Pt NTS per parents request.  Pt tolerated well and did have thick tan moderate amount of secretions.  Pt placed on 55% venti mask during NTS and remained on after.  Pt was given neb tx after being suctioned as well.  RT will continue to monitor.

## 2015-01-19 NOTE — Procedures (Signed)
Pt's father states that the second CPT was performed around 1730 so he refuses CPT at this time.  RT complies with father's request.  Pt has been placed on BiPAP for the evening.  Pt is tolerating well and sleeping comfortably. Rt will continue to monitor pt.

## 2015-01-20 DIAGNOSIS — A419 Sepsis, unspecified organism: Secondary | ICD-10-CM | POA: Insufficient documentation

## 2015-01-20 DIAGNOSIS — G808 Other cerebral palsy: Secondary | ICD-10-CM

## 2015-01-20 DIAGNOSIS — J690 Pneumonitis due to inhalation of food and vomit: Secondary | ICD-10-CM

## 2015-01-20 DIAGNOSIS — M869 Osteomyelitis, unspecified: Secondary | ICD-10-CM | POA: Insufficient documentation

## 2015-01-20 DIAGNOSIS — R6521 Severe sepsis with septic shock: Secondary | ICD-10-CM

## 2015-01-20 DIAGNOSIS — R140 Abdominal distension (gaseous): Secondary | ICD-10-CM | POA: Insufficient documentation

## 2015-01-20 DIAGNOSIS — M868X8 Other osteomyelitis, other site: Secondary | ICD-10-CM

## 2015-01-20 DIAGNOSIS — B9689 Other specified bacterial agents as the cause of diseases classified elsewhere: Secondary | ICD-10-CM

## 2015-01-20 LAB — MAGNESIUM: Magnesium: 1.3 mg/dL — ABNORMAL LOW (ref 1.7–2.4)

## 2015-01-20 LAB — BASIC METABOLIC PANEL
Anion gap: 12 (ref 5–15)
BUN: 20 mg/dL (ref 6–20)
CHLORIDE: 118 mmol/L — AB (ref 101–111)
CO2: 18 mmol/L — AB (ref 22–32)
CREATININE: 1.18 mg/dL (ref 0.61–1.24)
Calcium: 8.3 mg/dL — ABNORMAL LOW (ref 8.9–10.3)
GFR calc non Af Amer: >60 mL/min (ref >60)
GLUCOSE: 179 mg/dL — AB (ref 65–99)
Potassium: 4.7 mmol/L (ref 3.5–5.1)
Sodium: 148 mmol/L — ABNORMAL HIGH (ref 135–145)

## 2015-01-20 LAB — CBC WITH DIFFERENTIAL/PLATELET
BASOS PCT: 0 %
Basophils Absolute: 0 10*3/uL (ref 0.0–0.1)
EOS PCT: 2 %
Eosinophils Absolute: 0.1 10*3/uL (ref 0.0–0.7)
HEMATOCRIT: 31.1 % — AB (ref 39.0–52.0)
HEMOGLOBIN: 9.2 g/dL — AB (ref 13.0–17.0)
LYMPHS ABS: 0.3 10*3/uL — AB (ref 0.7–4.0)
Lymphocytes Relative: 7 %
MCH: 30.2 pg (ref 26.0–34.0)
MCHC: 29.6 g/dL — AB (ref 30.0–36.0)
MCV: 102 fL — AB (ref 78.0–100.0)
MONOS PCT: 15 %
Monocytes Absolute: 0.5 10*3/uL (ref 0.1–1.0)
NEUTROS ABS: 2.7 10*3/uL (ref 1.7–7.7)
Neutrophils Relative %: 76 %
Platelets: 85 10*3/uL — ABNORMAL LOW (ref 150–400)
RBC: 3.05 MIL/uL — AB (ref 4.22–5.81)
RDW: 21 % — ABNORMAL HIGH (ref 11.5–15.5)
WBC: 3.6 10*3/uL — ABNORMAL LOW (ref 4.0–10.5)

## 2015-01-20 LAB — GLUCOSE, CAPILLARY
GLUCOSE-CAPILLARY: 163 mg/dL — AB (ref 65–99)
GLUCOSE-CAPILLARY: 73 mg/dL (ref 65–99)
GLUCOSE-CAPILLARY: 95 mg/dL (ref 65–99)
GLUCOSE-CAPILLARY: 98 mg/dL (ref 65–99)
Glucose-Capillary: 68 mg/dL (ref 65–99)
Glucose-Capillary: 78 mg/dL (ref 65–99)
Glucose-Capillary: 80 mg/dL (ref 65–99)

## 2015-01-20 MED ORDER — DIPHENOXYLATE-ATROPINE 2.5-0.025 MG/5ML PO LIQD
10.0000 mL | Freq: Four times a day (QID) | ORAL | Status: DC
  Administered 2015-01-20 – 2015-01-24 (×16): 10 mL
  Filled 2015-01-20 (×15): qty 10

## 2015-01-20 MED ORDER — DIPHENOXYLATE-ATROPINE 2.5-0.025 MG/5ML PO LIQD: 5.0000 mL | Freq: Four times a day (QID) | ORAL | Status: DC

## 2015-01-20 MED ORDER — SODIUM CHLORIDE 0.9 % IV SOLN
600.0000 mg | Freq: Two times a day (BID) | INTRAVENOUS | Status: DC
  Administered 2015-01-20 – 2015-01-21 (×2): 600 mg via INTRAVENOUS
  Filled 2015-01-20 (×4): qty 600

## 2015-01-20 MED ORDER — POTASSIUM CHLORIDE 20 MEQ/15ML (10%) PO SOLN
40.0000 meq | Freq: Every day | ORAL | Status: DC
  Administered 2015-01-20 – 2015-01-22 (×3): 40 meq
  Filled 2015-01-20 (×4): qty 30

## 2015-01-20 MED ORDER — CHOLESTYRAMINE 4 G PO PACK
4.0000 g | PACK | Freq: Three times a day (TID) | ORAL | Status: DC
  Administered 2015-01-20 – 2015-01-24 (×11): 4 g
  Filled 2015-01-20 (×15): qty 1

## 2015-01-20 MED ORDER — METOCLOPRAMIDE HCL 5 MG/5ML PO SOLN
10.0000 mg | Freq: Three times a day (TID) | ORAL | Status: DC
  Administered 2015-01-20 (×3): 10 mg
  Filled 2015-01-20 (×8): qty 10

## 2015-01-20 MED ORDER — FREE WATER
180.0000 mL | Freq: Three times a day (TID) | Status: DC
  Administered 2015-01-20 – 2015-01-24 (×11): 180 mL

## 2015-01-20 NOTE — Progress Notes (Signed)
Pt NTS for small-moderate clear/white thick secretions.

## 2015-01-20 NOTE — Progress Notes (Signed)
Jeffery [name] is about the same from my perspective. His abdomen is not as distended. He had an abdominal film yesterday which showed improvement in colonic distention. It seems as if the diarrhea might be getting better.  A respiratory culture showed gram-negative rods. This is not been identified yet. It is sore could be Klebsiella. It could be colonization.  His sodium is improving. Today, sodium is 148. His potassium is 3.6. His alkaline phosphatase is quite high at 469. I do not know if this is part of the osteomyelitis. His albumin is only 1.5  His CBC shows a white count 3.6. Hemoglobin 9.2 and platelet count 85,000.  His albumin is only 1.3. This is incredibly low. Going away to improve this is to feed him enterally. However, he has the diarrhea that might make this difficult.  Apparently, there was some issues with the husband and the nursing staff. I hurt all about this for the husband this morning. I'm still not sure as to what the issue was. Personally, I think he is getting fantastic care from the staff up on [address] this is very complicated. I know that they are doing their best and I really think they are doing a great job.  On his exam, his vital signs were all stable and his pulse is 116. His temperature 99.9.  His abdomen is slightly less distended. There may be some more bowel sounds. Cardiac exam is tachycardic but regular. Lungs show some crackles at the bases.  It will be interesting to see what the gram-negative rod is.  I don't think he needs any transfusions. I don't think he needs any Neupogen right now.  We will continue to help as much we can.  Ramond Dial 2:7

## 2015-01-20 NOTE — Progress Notes (Signed)
Family refused CPT at this time. Stating it was too late to do and would try to do in the morning in the chair. Also requested to be NTS. Attempted x2 pre breathing tx and attempted x2 post breathing tx. Received small Maisen Klingler, thin sputum. Patient tolerated well. Dad in room at bedside.

## 2015-01-20 NOTE — Progress Notes (Signed)
[facility] for [facility]    Subjective:  Non verbal  Antibiotics:  Anti-infectives    Start     Dose/Rate Route Frequency Ordered Stop   01/20/15 1800  ceftaroline (TEFLARO) 600 mg in sodium chloride 0.9 % 250 mL IVPB     600 mg 250 mL/hr over 60 Minutes Intravenous Every 12 hours 01/20/15 1405     01/14/15 1200  cefTAZidime (FORTAZ) 2 g in dextrose 5 % 50 mL IVPB  Status:  Discontinued     2 g 100 mL/hr over 30 Minutes Intravenous Every 24 hours 01/13/15 1344 01/13/15 1553   01/13/15 1800  ceftaroline (TEFLARO) 300 mg in sodium chloride 0.9 % 250 mL IVPB  Status:  Discontinued     300 mg 250 mL/hr over 60 Minutes Intravenous Every 12 hours 01/13/15 1558 01/20/15 1405   01/13/15 1700  ceftaroline (TEFLARO) 300 mg in sodium chloride 0.9 % 250 mL IVPB  Status:  Discontinued     300 mg 250 mL/hr over 60 Minutes Intravenous Every 12 hours 01/13/15 1557 01/13/15 1558   01/13/15 1600  metroNIDAZOLE (FLAGYL) IVPB 500 mg     500 mg 100 mL/hr over 60 Minutes Intravenous Every 8 hours 01/13/15 1554     01/13/15 0845  linezolid (ZYVOX) IVPB 600 mg  Status:  Discontinued     600 mg 300 mL/hr over 60 Minutes Intravenous Every 12 hours 02/01/2015 2345 01/08/2015 2350   01/08/2015 2200  linezolid (ZYVOX) IVPB 600 mg  Status:  Discontinued     600 mg 300 mL/hr over 60 Minutes Intravenous Every 12 hours 01/17/2015 2020 01/13/15 1532   01/15/2015 2200  cefTAZidime (FORTAZ) 2 g in dextrose 5 % 50 mL IVPB  Status:  Discontinued     2 g 100 mL/hr over 30 Minutes Intravenous Every 12 hours 01/10/2015 2034 01/13/15 1344      Medications: Scheduled Meds: . acetaminophen  650 mg Oral Once  . antiseptic oral rinse  7 mL Mouth Rinse q12n4p  . ceFTAROline (TEFLARO) IV  600 mg Intravenous Q12H  . chlorhexidine  15 mL Mouth Rinse BID  . cholestyramine  4 g Per Tube TID  . cloBAZam  3.75 mg Per Tube TID AC  . [facility] Beanaid  2 capsule Per J Tube TID  . diphenoxylate-atropine   10 mL Per Tube QID  . feeding supplement (PRO-STAT SUGAR FREE 64)  30 mL Per Tube TID  . free water  180 mL Per Tube 3 times per day  . gabapentin  900 mg Per Tube 3 times per day  . levalbuterol  0.63 mg Nebulization Q6H  . metoCLOPramide  10 mg Per Tube TID AC & HS  . metronidazole  500 mg Intravenous Q8H  . pantoprazole sodium  40 mg Per Tube BID  . potassium chloride  40 mEq Per Tube Daily  . sodium chloride  3 mL Intravenous Q12H   Continuous Infusions: . baclofen    . dextrose 5 % 1,000 mL with potassium chloride 20 mEq infusion 125 mL/hr at 01/19/15 2015  . TWOCAL HN 237 mL (01/17/15 1000)   PRN Meds:.acetaminophen (TYLENOL) oral liquid 160 mg/5 mL **OR** acetaminophen, dextrose, dicyclomine, glycopyrrolate, ibuprofen, ketoconazole, levalbuterol, zinc oxide    Objective: Weight change:   Intake/Output Summary (Last 24 hours) at 01/20/15 1504 Last data filed at 01/20/15 1129  Gross per 24 hour  Intake 2965.84 ml  Output   3550 ml  Net -584.16 ml   Blood pressure 103/56, pulse 117, temperature 97.4 F (36.3 C), temperature source Axillary, resp. rate 18, height _0  (1.499 m), weight 129 lb 10.1 oz (58.8 kg), SpO2 91 %. Temp:  [97.4 F (36.3 C)-99.9 F (37.7 C)] 97.4 F (36.3 C) (12/19 1126) Pulse Rate:  [112-126] 117 (12/19 1344) Resp:  [12-26] 18 (12/19 1344) BP: (103-121)/(56-76) 103/56 mmHg (12/19 0750) SpO2:  [86 %-100 %] 91 % (12/19 1342) FiO2 (%):  [40 %] 40 % (12/19 1342)  Physical Exam: General: awake, coughing wearing headgear HEENT:, EOMI CVS  Tachycardic regular rate, normal r,  no murmur rubs or gallops Chest: , few rhonchi scattered Abdomen: soft slightly distended normal bowel sounds, Extremities:contractures Skin: no rashes Neuro: nonfocal  CBC:  CBC Latest Ref Rng 01/20/2015 01/19/2015 01/18/2015  WBC 4.0 - 10.5 K/uL 3.6(L) 4.2 2.3(L)  Hemoglobin 13.0 - 17.0 g/dL 9.2(L) 9.4(L) 8.0(L)  Hematocrit 39.0 - 52.0 % 31.1(L) 30.9(L) 26.7(L)    Platelets 150 - 400 K/uL 85(L) 72(L) 68(L)      BMET  Recent Labs  01/19/15 0310 01/20/15 0420  NA 152* 148*  K 3.6 4.7  CL 124* 118*  CO2 19* 18*  GLUCOSE 80 179*  BUN 23* 20  CREATININE 1.21 1.18  CALCIUM 8.2* 8.3*     Liver Panel   Recent Labs  01/18/15 0330 01/19/15 0310  PROT 4.3* 4.0*  ALBUMIN 1.5* 1.5*  AST 21 19  ALT 26 20  ALKPHOS 494* 469*  BILITOT 0.5 0.4       Sedimentation Rate No results for input(s): ESRSEDRATE in the last 72 hours. C-Reactive Protein No results for input(s): CRP in the last 72 hours.  Micro Results: Recent Results (from the past 720 hour(s))  Blood Culture (routine x 2)     Status: None   Collection Time: 12/31/14  2:51 PM  Result Value Ref Range Status   Specimen Description BLOOD PORTA CATH  Final   Special Requests BOTTLES DRAWN AEROBIC AND ANAEROBIC 6CC   Final   Culture NO GROWTH 5 DAYS  Final   Report Status 01/05/2015 FINAL  Final  Urine culture     Status: None   Collection Time: 12/31/14  2:54 PM  Result Value Ref Range Status   Specimen Description URINE, CATHETERIZED  Final   Special Requests NONE  Final   Culture MULTIPLE SPECIES PRESENT, SUGGEST RECOLLECTION  Final   Report Status 01/01/2015 FINAL  Final  Blood Culture (routine x 2)     Status: None   Collection Time: 12/31/14  3:50 PM  Result Value Ref Range Status   Specimen Description BLOOD LEFT HAND  Final   Special Requests BOTTLES DRAWN AEROBIC AND ANAEROBIC 5CC  Final   Culture NO GROWTH 5 DAYS  Final   Report Status 01/05/2015 FINAL  Final  MRSA PCR Screening     Status: None   Collection Time: 12/31/14  7:17 PM  Result Value Ref Range Status   MRSA by PCR NEGATIVE NEGATIVE Final    Comment:        The GeneXpert MRSA Assay (FDA approved for NASAL specimens only), is one component of a comprehensive MRSA colonization surveillance program. It is not intended to diagnose MRSA infection nor to guide or monitor treatment for MRSA  infections.   Urine culture     Status: None   Collection Time: 01/02/15  5:14 AM  Result Value Ref Range Status   Specimen Description URINE, RANDOM  Final  Special Requests NONE  Final   Culture 10,000 COLONIES/mL YEAST  Final   Report Status 01/03/2015 FINAL  Final  C difficile quick scan w PCR reflex     Status: None   Collection Time: 01/04/15  4:01 PM  Result Value Ref Range Status   C Diff antigen NEGATIVE NEGATIVE Final   C Diff toxin NEGATIVE NEGATIVE Final   C Diff interpretation Negative for toxigenic C. difficile  Final  Culture, blood (routine x 2)     Status: None   Collection Time: 01/02/2015  2:21 PM  Result Value Ref Range Status   Specimen Description PORTA CATH A-LINE  Final   Special Requests BOTTLES DRAWN AEROBIC AND ANAEROBIC 5ML  Final   Culture NO GROWTH 5 DAYS  Final   Report Status 01/17/2015 FINAL  Final  Culture, blood (routine x 2)     Status: None   Collection Time: 01/22/2015  7:30 PM  Result Value Ref Range Status   Specimen Description BLOOD LEFT HAND  Final   Special Requests PEDIATRICS 2CC  Final   Culture NO GROWTH 5 DAYS  Final   Report Status 01/17/2015 FINAL  Final  Urine culture     Status: None   Collection Time: 01/25/2015  7:44 PM  Result Value Ref Range Status   Specimen Description URINE, RANDOM  Final   Special Requests PEDS BAG  Final   Culture 4,000 COLONIES/mL INSIGNIFICANT GROWTH  Final   Report Status 01/13/2015 FINAL  Final  C difficile quick scan w PCR reflex     Status: None   Collection Time: 01/13/15 12:45 AM  Result Value Ref Range Status   C Diff antigen NEGATIVE NEGATIVE Final   C Diff toxin NEGATIVE NEGATIVE Final   C Diff interpretation Negative for toxigenic C. difficile  Final  Urine culture     Status: None   Collection Time: 01/16/15  4:15 PM  Result Value Ref Range Status   Specimen Description URINE, CATHETERIZED  Final   Special Requests NONE  Final   Culture NO GROWTH 1 DAY  Final   Report Status  01/17/2015 FINAL  Final  Culture, respiratory (NON-Expectorated)     Status: None (Preliminary result)   Collection Time: 01/17/15 11:55 AM  Result Value Ref Range Status   Specimen Description ASPIRATE  Final   Special Requests NASAL ASPIRATE Immunocompromised  Final   Gram Stain   Final    NO WBC SEEN ABUNDANT SQUAMOUS EPITHELIAL CELLS PRESENT RARE GRAM NEGATIVE RODS MICROSCOPIC FINDINGS SUGGEST THAT THIS SPECIMEN IS NOT REPRESENTATIVE OF LOWER RESPIRATORY SECRETIONS. PLEASE RECOLLECT. MICROSCOPIC FINDINGS SUGGEST THAT THIS SPECIMEN IS NOT REPRESENTATIVE OF LOWER RESPIRATORY SECRETIONS. PLEASE RECOLLECT.    Culture   Final    MODERATE GRAM NEGATIVE RODS Performed at [facility]    Report Status PENDING  Incomplete  C difficile quick scan w PCR reflex     Status: None   Collection Time: 01/19/15  7:40 AM  Result Value Ref Range Status   C Diff antigen NEGATIVE NEGATIVE Final   C Diff toxin NEGATIVE NEGATIVE Final   C Diff interpretation Negative for toxigenic C. difficile  Final    Studies/Results: Dg Abd Portable 1v  01/19/2015  CLINICAL DATA:  Distended abdomen.  Sepsis. EXAM: PORTABLE ABDOMEN - 1 VIEW COMPARISON:  01/18/2015 and prior exams FINDINGS: Gaseous distention of the colon has decreased. No dilated small bowel loops are noted. Spinal surgical hardware and spinal pump again noted. Enteric tube again  noted. IMPRESSION: Decreased colonic distention. Electronically Signed   By: Margarette Canada M.D.   On: 01/19/2015 14:30      Assessment/Plan:  INTERVAL HISTORY:      Principal Problem:   Severe sepsis (HCC) Active Problems:   Acute on chronic respiratory failure with hypoxia (HCC)   Cerebral palsy, quadriplegic (HCC)   Acquired pancytopenia (HCC)   Transaminitis   Seizures (HCC)   Pressure ulcer   Osteomyelitis (HCC)   Malnutrition (HCC)   Hypernatremia   AKI (acute kidney injury) ([place])   PNA (pneumonia)    Jeffery [name] is a 24 y.o. male with   Cerebral palsy, quadraplegia, pelvic osteomyelitis left ischium dc on invanz with vanomcyin levels having been supratherapeutic, admitted with septic picture with fevers, ? Aspiration event. Vancomycin levels were now undetectable. He was given zyvox and ceftazidime but developed TTPenia from zyvox now on teflaro and flagyl  #1 Sepsis: I still worry about this pelvic osteomyelitis as being the source  --would complete 4 weeks of both drugs as outlined by Dr. Johnnye Sima  #2 Aspiration PNA: the culture that was collected was mouth flora and pt is improving regardless of what is isolated so would ignore it  Diagnosis: Pelvic osteomyelitis, aspiration pneumonia sepiss  Culture Result: no helpful cultures  Allergies  Allergen Reactions  . Depakote [Divalproex Sodium] Other (See Comments)    Causes pancreatitis   . Keppra [Levetiracetam] Other (See Comments)    Bone marrow suppression  . Vimpat [Lacosamide] Rash  . Adhesive [Tape] Other (See Comments)    Rips skin off (paper tape is ok)  . Neulasta [Pegfilgrastim] Other (See Comments)    Fever, tachycardia    Discharge antibiotics: Per pharmacy protocol Teflaro and flagyl Duration: 28 days End Date:  January 8th, 2016   [facility] Care Per Protocol: Labs weekly while on IV antibiotics: _x_ CBC with differential _x_ CMP _x_ CRP x__ ESR   Fax weekly labs to [phone]  Clinic Follow Up Appt:  In next 4-6 weeks   Note if he goes to SNF or [facility] they pharmacist at that facility has to dose his antibiotics by protocol If he goes home with IV abx (not clear this is appropriate) then please sent with [facility] directing the IV abx  I will sign off for now   Please call with further questions.        LOS: 8 days   Alcide Evener 01/20/2015, 3:04 PM

## 2015-01-20 NOTE — Progress Notes (Signed)
PATIENT DETAILS Name: Jeffery [name] Age: 24 y.o. Sex: male Date of Birth: 20-Feb-1990 Admit Date: 01/04/2015 Admitting Physician Edwin Dada, MD [id number], DAVID C, MD  Subjective: On Nasal Cannula-awake-not in distress.   Assessment/Plan: Principal Problem: Severe sepsis: Likely secondary to aspiration pneumonia, but also has ongoing osteomyelitis of the sacrum. Sepsis pathophysiology has resolved. Afebrile now for past few days. ID following-the recommendations are to continue with ceftaroline/flagyl till Feb 11 2015.  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  Acute on chronic hypoxic respiratory failure: Hospital course complicated by episodes of acute hypoxic respiratory failure from aspiration pneumonitis (vomiting/pooling of secretions). Family aware that given repeated acute illness-patient is very deconditioned and weaker that usual-which is likely further exacerbating aspiration episodes. Overall slowly improving, continue to use BiPAP/suctioning as needed. Patient's family has refused to meet with palliative care, DO NOT RESUSCITATE remains in place.  Presumed aspiration pneumonia: See above. Has a long-standing issue with pooling of secretions-but seems to have had more frequent episodes over the past few days. Continue when necessary Robinul, suctioning, vibrator vest, bronchodilators etc. see above  Hypernatremia: Suspect secondary to dehydration from sepsis, inability to start tube feeds due to aspiration/resp failure and diarrhea. Slowly improving, Continue IV fluids at 125 mL an hour  Hypokalemia: Repleted.Likely secondary to diarrhea.  ARF: Likely prerenal azotemia in the setting of sepsis, slowly  improving.  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 Mother reports that LFTs worsens after he gets Neupogen. RUQ ultrasound negative for hepatobiliary pathology  Anemia/Leukopenia: Chronic issue-attributed to bone marrow suppression from Keppra and other medications. Oncology/Hematology following.Received one 1 unit of PRBC and Neupogen 12/17  Diarrhea:? Secondary to antibiotics. C. difficile PCR X 2 negative. Seems to have slowed down somewhat-per father at bedside, will change Imodium to Lomotil, and add scheduled Questran. Abdominal x-ray 12/18 improved dilatation.   Sacral decubitus stage IV: Present prior to [facility] care following.  Dysphagia requring chronic J tube feeds: Continue regular tube feeds-is NPO. See above regarding aspiration  History of seizures:Continue Onfi and Neurontin.Ensure follow up with Dr Gaynell Face  Hx of cerebral palsy/spastic quadriplegic: Has a intrathecal baclofen pump in place.   [id number] to neurologic issues-on BIPAP Qhs  Palliative care:DNR in place-unfortunate 24 year old with cerebral palsy and spastic quadriplegia-history of seizures-now with recurrent admissions for sepsis from aspiration pneumonia and  ongoing sacral osteomyelitis. Family aware that not a candidate for further escalation of care-goal is to try and manage this situation with antibiotics as much as possible. Unfortunately, hospital course complicated by development of aspiration pneumonitis and acute respiratory failure-and persistent diarrhea. Family is reluctant to start comfort measures/hospice care, and are hopeful to take patient home when electrolytes/diarrhea stable.  Disposition: Remain inpatient-remain in SDU  Antimicrobial agents  See below  Anti-infectives    Start     Dose/Rate Route Frequency Ordered Stop   01/20/15 1800  ceftaroline (TEFLARO) 600 mg in sodium chloride 0.9 % 250 mL IVPB     600 mg 250 mL/hr over  60 Minutes Intravenous Every 12 hours 01/20/15 1405     01/14/15 1200  cefTAZidime (FORTAZ) 2 g in dextrose 5 % 50 mL IVPB  Status:  Discontinued     2 g 100  mL/hr over 30 Minutes Intravenous Every 24 hours 01/13/15 1344 01/13/15 1553   01/13/15 1800  ceftaroline (TEFLARO) 300 mg in sodium chloride 0.9 % 250 mL IVPB  Status:  Discontinued     300 mg 250 mL/hr over 60 Minutes Intravenous Every 12 hours 01/13/15 1558 01/20/15 1405   01/13/15 1700  ceftaroline (TEFLARO) 300 mg in sodium chloride 0.9 % 250 mL IVPB  Status:  Discontinued     300 mg 250 mL/hr over 60 Minutes Intravenous Every 12 hours 01/13/15 1557 01/13/15 1558   01/13/15 1600  metroNIDAZOLE (FLAGYL) IVPB 500 mg     500 mg 100 mL/hr over 60 Minutes Intravenous Every 8 hours 01/13/15 1554     01/13/15 0845  linezolid (ZYVOX) IVPB 600 mg  Status:  Discontinued     600 mg 300 mL/hr over 60 Minutes Intravenous Every 12 hours 01/19/2015 2345 01/18/2015 2350   01/26/2015 2200  linezolid (ZYVOX) IVPB 600 mg  Status:  Discontinued     600 mg 300 mL/hr over 60 Minutes Intravenous Every 12 hours 01/08/2015 2020 01/13/15 1532   01/29/2015 2200  cefTAZidime (FORTAZ) 2 g in dextrose 5 % 50 mL IVPB  Status:  Discontinued     2 g 100 mL/hr over 30 Minutes Intravenous Every 12 hours 01/09/2015 2034 01/13/15 1344      DVT Prophylaxis: SCD's  Code Status:  DNR  Family Communication Father at bedside Left msg for mother [name]  Procedures: None  CONSULTS:  pulmonary/intensive care, ID and hematology/oncology  Time spent 35 minutes-Greater than 50% of this time was spent in counseling, explanation of diagnosis, planning of further management, and coordination of care.  MEDICATIONS: Scheduled Meds: . acetaminophen  650 mg Oral Once  . antiseptic oral rinse  7 mL Mouth Rinse q12n4p  . ceFTAROline (TEFLARO) IV  600 mg Intravenous Q12H  . chlorhexidine  15 mL Mouth Rinse BID  . cholestyramine  4 g Per Tube TID  . cloBAZam  3.75 mg Per Tube  TID AC  . [facility] [facility] Beanaid  2 capsule Per J Tube TID  . diphenoxylate-atropine  10 mL Per Tube QID  . feeding supplement (PRO-STAT SUGAR FREE 64)  30 mL Per Tube TID  . free water  180 mL Per Tube 3 times per day  . gabapentin  900 mg Per Tube 3 times per day  . levalbuterol  0.63 mg Nebulization Q6H  . metoCLOPramide  10 mg Per Tube TID AC & HS  . metronidazole  500 mg Intravenous Q8H  . pantoprazole sodium  40 mg Per Tube BID  . potassium chloride  40 mEq Per Tube Daily  . sodium chloride  3 mL Intravenous Q12H   Continuous Infusions: . baclofen    . dextrose 5 % 1,000 mL with potassium chloride 20 mEq infusion 125 mL/hr at 01/19/15 2015  . TWOCAL HN 237 mL (01/17/15 1000)   PRN Meds:.acetaminophen (TYLENOL) oral liquid 160 mg/5 mL **OR** acetaminophen, dextrose, dicyclomine, glycopyrrolate, ibuprofen, ketoconazole, levalbuterol, zinc oxide    PHYSICAL EXAM: Vital signs in last 24 hours: Filed Vitals:   01/20/15 1126 01/20/15 1210 01/20/15 1342 01/20/15 1344  BP:      Pulse:    117  Temp: 97.4 F (36.3 C)     TempSrc: Axillary     Resp:    18  Height:      Weight:      SpO2:  92% 91%     Weight change:  [employer]  01/05/2015 2341  Weight: 58.8 kg (129 lb 10.1 oz)   Body mass index is 26.17 kg/(m^2).  Gen Exam: On nasal cannula. Not in any distress-awake Neck: Supple Chest: Continues to have some transmitted upper airway sounds CVS: S1 S2 Regular, no murmurs.  Abdomen: soft, mildly distended. G-tube in place Extremities: no edema, lower extremities warm to touch. Neurologic: Decreased bulk-0 X 5 in all 4 ext   Intake/Output from previous day:  Intake/Output Summary (Last 24 hours) at 01/20/15 1405 Last data filed at 01/20/15 1129  Gross per 24 hour  Intake 2965.84 ml  Output   3900 ml  Net -934.16 ml     LAB RESULTS: CBC  Recent Labs Lab 01/16/15 0347 01/17/15 0400 01/18/15 0330 01/19/15 0310 01/20/15 0420  WBC 7.0 4.6 2.3* 4.2 3.6*  HGB  8.9* 8.7* 8.0* 9.4* 9.2*  HCT 29.9* 28.3* 26.7* 30.9* 31.1*  PLT 64* 74* 68* 72* 85*  MCV 99.3 98.3 101.9* 100.3* 102.0*  MCH 29.6 30.2 30.5 30.5 30.2  MCHC 29.8* 30.7 30.0 30.4 29.6*  RDW 21.6* 21.5* 22.0* 21.0* 21.0*  LYMPHSABS 0.4* 0.5* 0.3* 0.4* 0.3*  MONOABS 0.5 0.4 0.3 0.3 0.5  EOSABS 0.1 0.2 0.1 0.1 0.1  BASOSABS 0.0 0.0 0.0 0.1 0.0    Chemistries   Recent Labs Lab 01/16/15 0347 01/17/15 0400 01/18/15 0330 01/19/15 0310 01/20/15 0420  NA 155* 155* 159* 152* 148*  K 2.9* 3.0* 3.5 3.6 4.7  CL 124* 125* 127* 124* 118*  CO2 22 20* 21* 19* 18*  GLUCOSE 97 107* 87 80 179*  BUN 31* 30* 28* 23* 20  CREATININE 1.49* 1.47* 1.32* 1.21 1.18  CALCIUM 8.1* 8.1* 8.2* 8.2* 8.3*  MG  --  1.6*  --   --  1.3*    CBG:  Recent Labs Lab 01/19/15 1554 01/19/15 2353 01/20/15 0425 01/20/15 0841 01/20/15 1125  GLUCAP 85 73 163* 68 78    GFR Estimated Creatinine Clearance: 71.1 mL/min (by C-G formula based on Cr of 1.18).  Coagulation profile No results for input(s): INR, PROTIME in the last 168 hours.  Cardiac Enzymes No results for input(s): CKMB, TROPONINI, MYOGLOBIN in the last 168 hours.  Invalid input(s): CK  Invalid input(s): POCBNP No results for input(s): DDIMER in the last 72 hours. No results for input(s): HGBA1C in the last 72 hours. No results for input(s): CHOL, HDL, LDLCALC, TRIG, CHOLHDL, LDLDIRECT in the last 72 hours. No results for input(s): TSH, T4TOTAL, T3FREE, THYROIDAB in the last 72 hours.  Invalid input(s): FREET3 No results for input(s): VITAMINB12, FOLATE, FERRITIN, TIBC, IRON, RETICCTPCT in the last 72 hours. No results for input(s): LIPASE, AMYLASE in the last 72 hours.  Urine Studies No results for input(s): UHGB, CRYS in the last 72 hours.  Invalid input(s): UACOL, UAPR, USPG, UPH, UTP, UGL, UKET, UBIL, UNIT, UROB, ULEU, UEPI, UWBC, URBC, UBAC, CAST, UCOM, BILUA  MICROBIOLOGY: Recent Results (from the past 240 hour(s))  Culture,  blood (routine x 2)     Status: None   Collection Time: 01/05/2015  2:21 PM  Result Value Ref Range Status   Specimen Description PORTA CATH A-LINE  Final   Special Requests BOTTLES DRAWN AEROBIC AND ANAEROBIC 5ML  Final   Culture NO GROWTH 5 DAYS  Final   Report Status 01/17/2015 FINAL  Final  Culture, blood (routine x 2)     Status: None   Collection Time: 01/15/2015  7:30 PM  Result Value Ref Range Status   Specimen Description  BLOOD LEFT HAND  Final   Special Requests PEDIATRICS 2CC  Final   Culture NO GROWTH 5 DAYS  Final   Report Status 01/17/2015 FINAL  Final  Urine culture     Status: None   Collection Time: 01/07/2015  7:44 PM  Result Value Ref Range Status   Specimen Description URINE, RANDOM  Final   Special Requests PEDS BAG  Final   Culture 4,000 COLONIES/mL INSIGNIFICANT GROWTH  Final   Report Status 01/13/2015 FINAL  Final  C difficile quick scan w PCR reflex     Status: None   Collection Time: 01/13/15 12:45 AM  Result Value Ref Range Status   C Diff antigen NEGATIVE NEGATIVE Final   C Diff toxin NEGATIVE NEGATIVE Final   C Diff interpretation Negative for toxigenic C. difficile  Final  Urine culture     Status: None   Collection Time: 01/16/15  4:15 PM  Result Value Ref Range Status   Specimen Description URINE, CATHETERIZED  Final   Special Requests NONE  Final   Culture NO GROWTH 1 DAY  Final   Report Status 01/17/2015 FINAL  Final  Culture, respiratory (NON-Expectorated)     Status: None (Preliminary result)   Collection Time: 01/17/15 11:55 AM  Result Value Ref Range Status   Specimen Description ASPIRATE  Final   Special Requests NASAL ASPIRATE Immunocompromised  Final   Gram Stain   Final    NO WBC SEEN ABUNDANT SQUAMOUS EPITHELIAL CELLS PRESENT RARE GRAM NEGATIVE RODS MICROSCOPIC FINDINGS SUGGEST THAT THIS SPECIMEN IS NOT REPRESENTATIVE OF LOWER RESPIRATORY SECRETIONS. PLEASE RECOLLECT. MICROSCOPIC FINDINGS SUGGEST THAT THIS SPECIMEN IS NOT REPRESENTATIVE  OF LOWER RESPIRATORY SECRETIONS. PLEASE RECOLLECT.    Culture   Final    MODERATE GRAM NEGATIVE RODS Performed at [facility]    Report Status PENDING  Incomplete  C difficile quick scan w PCR reflex     Status: None   Collection Time: 01/19/15  7:40 AM  Result Value Ref Range Status   C Diff antigen NEGATIVE NEGATIVE Final   C Diff toxin NEGATIVE NEGATIVE Final   C Diff interpretation Negative for toxigenic C. difficile  Final    RADIOLOGY STUDIES/RESULTS: Ct Pelvis Wo Contrast  01/01/2015  CLINICAL DATA:  Cerebral palsy.  Clinical concern for osteomyelitis. EXAM: CT PELVIS WITHOUT CONTRAST TECHNIQUE: Multidetector CT imaging of the pelvis was performed following the standard protocol without intravenous contrast. COMPARISON:  10/15/2014 CT abdomen/pelvis. FINDINGS: Reproductive: Normal prostate and seminal vesicles. Bladder: Collapsed and grossly normal urinary bladder. Visualized ureters are normal caliber. Bowel: Visualized small and large bowel are normal caliber with no bowel wall thickening. Oral contrast traverses to the distal rectum. Partially visualized is an enteric tube in the distal duodenum and proximal jejunum, with the tip not seen on this study. Vascular/Lymphatic: No pathologically enlarged lymph nodes in the pelvis. Other: No pneumoperitoneum, ascites or focal fluid collection. Musculoskeletal: There is curvilinear gas, fat stranding and ill-defined fluid in the posterior medial left gluteal subcutaneous soft tissues (series 4/ image 125), suggestive of an incompletely visualized decubitus ulcer, with extension of the fat stranding and fluid to the inferior margin of the left ischium. There is mild focal cortical erosion at the inferior left ischium with associated periosteal reaction, in keeping with osteomyelitis. No presacral fluid or fat stranding. No evidence of a sacral decubitus ulcer. Subcutaneous ventral right lower quadrant spinal stimulator device is noted  with lead coursing posteriorly and entering the lumbar spinal canal, with the  tip not visualized on this study. No aggressive appearing focal osseous lesions. 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 There is stable diffuse osteopenia. No osseous fracture. No additional focal cortical erosions or periosteal reaction. Partially visualized is bilateral posterior spinal fusion hardware with posterior bilateral fusion rods and interlocking screws extending into the bilateral iliac wings, with no evidence of hardware fracture or loosening. IMPRESSION: 1. Osteomyelitis of the inferior left ischium associated with curvilinear fat stranding, fluid and gas in the posteromedial left gluteal subcutaneous soft tissues, presumably due to a medial left gluteal decubitus ulcer (which is incompletely visualized on this study). No focal drainable fluid collection. 2. No pelvic ascites or intraperitoneal fluid collection. No presacral fluid or inflammatory change. 3. 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 Electronically Signed   By: Ilona Sorrel M.D.   On: 01/01/2015 10:34   US Abdomen Complete  01/13/2015  CLINICAL DATA:  Acute onset of abdominal distention and sepsis. Initial encounter. EXAM: ULTRASOUND ABDOMEN COMPLETE COMPARISON:  Right upper quadrant ultrasound performed 01/05/2015, and CT of the abdomen and pelvis performed 10/15/2014 FINDINGS: Gallbladder: Stones are noted dependently in the gallbladder. The gallbladder wall is borderline prominent. No pericholecystic fluid is seen. Evaluation for ultrasonographic Murphy's sign is not possible as the patient is unconscious. Common bile duct: Diameter: 0.4 cm, within normal limits in caliber. Liver: No focal lesion identified. Within normal limits in parenchymal echogenicity.  Difficult to fully assess due to the midline pain pump. IVC: No abnormality visualized. Pancreas: Not visualized. Spleen: Size and appearance within normal limits. Right Kidney: Length: 9.4 cm. Not fully assessed due to overlying bowel gas. Echogenicity within normal limits. No mass or hydronephrosis visualized. Left Kidney: Not fully assessed due to overlying bowel gas. Echogenicity within normal limits. No mass or hydronephrosis visualized. Abdominal aorta: Not visualized due to overlying bowel gas. Other findings: None. IMPRESSION: 1. No acute abnormality seen within the abdomen. Evaluation is significantly suboptimal due to bowel gas. 2. Cholelithiasis. Borderline prominent gallbladder wall. Gallbladder otherwise grossly unremarkable. Electronically Signed   By: Garald Balding M.D.   On: 01/13/2015 23:07   Ir Replc Duoden/jejuno Tube Percut W/fluoro  01/01/2015  CLINICAL DATA:  Routine jejunostomy tube exchange EXAM: IR REPLACE DUODEN/JEJUNO TUBE PERCUT WITH FLOURO FLUOROSCOPY TIME:  12 seconds MEDICATIONS AND MEDICAL HISTORY: None ANESTHESIA/SEDATION: None CONTRAST:  5 cc Omnipaque 300 PROCEDURE: The procedure, risks, benefits, and alternatives were explained to the patient. Questions regarding the procedure were encouraged and answered. The patient understands and consents to the procedure. The abdomen was prepped with Betadine in a sterile fashion, and a sterile drape was applied covering the operative field. A sterile gown and sterile gloves were used for the procedure. The existing gastrojejunostomy tube was removed over a stiff glidewire and exchange for a new make jejunostomy tube. The tip was positioned in the proximal jejunum. Contrast was injected. 10 cc saline was utilized to insufflate the balloon. FINDINGS: The jejunostomy tube is been exchange. Tip is in the proximal jejunum. COMPLICATIONS: None IMPRESSION: Successful jejunostomy tube exchange. Electronically Signed   By: Marybelle Killings M.D.    On: 01/01/2015 12:18   Dg Chest Port 1 View  01/16/2015  CLINICAL DATA:  Shortness of breath. EXAM: PORTABLE CHEST 1 VIEW COMPARISON:  January 15, 2015. FINDINGS: Hypoinflation of the lungs is noted. Stable cardiomediastinal silhouette. Status post surgical fusion of the thoracic  and lumbar spine. Mild to moderate bibasilar opacities are noted concerning for subsegmental atelectasis or possibly pneumonia. No pneumothorax is noted. No significant pleural effusion is noted. IMPRESSION: Mild to moderate bibasilar opacities are noted concerning for subsegmental atelectasis or pneumonia. Hypoinflation of the lungs is again noted. Electronically Signed   By: Marijo Conception, M.D.   On: 01/16/2015 18:50   Dg Chest Port 1 View  01/15/2015  CLINICAL DATA:  Pneumonia. EXAM: PORTABLE CHEST 1 VIEW COMPARISON:  01/06/2015 FINDINGS: Chronic low lung volumes. Increased densities at the left lung base are concerning for worsening atelectasis or infection. Stable appearance of the heart and mediastinum. Port-A-Cath in the SVC region. Stable appearance of the spinal surgical hardware. IMPRESSION: Increased densities in the left lower lobe are concerning for worsening atelectasis or infection. Chronic low lung volumes. Electronically Signed   By: Markus Daft M.D.   On: 01/15/2015 13:02   Dg Chest Portable 1 View  01/26/2015  CLINICAL DATA:  Fever. EXAM: PORTABLE CHEST 1 VIEW COMPARISON:  December 3rd 2016. FINDINGS: Stable chronic low lung volumes are noted. Stable surgical fusion of thoraco lumbar spine is noted. No pneumothorax is noted. Increased bibasilar linear densities are noted most consistent with worsening subsegmental atelectasis or possibly pneumonia. Cardiomediastinal silhouette is unchanged. IMPRESSION: Stable chronic low lung volumes are noted. Increased bibasilar linear densities are noted most consistent with worsening subsegmental atelectasis or possibly pneumonia. Electronically Signed   By: Marijo Conception, M.D.   On: 01/21/2015 20:01   Dg Chest Port 1 View  01/04/2015  CLINICAL DATA:  Shortness of breath and fever.  Recent pneumonia. EXAM: PORTABLE CHEST 1 VIEW COMPARISON:  01/01/2015 and 12/31/2014. FINDINGS: 0814 hours. There are chronic low lung volumes. The patient's mandible overlies the right lung apex. Chronic bibasilar atelectasis or scarring is unchanged. There is no airspace disease, edema or pleural effusion. Right subclavian Port-A-Cath appears unchanged. The bones appear unchanged status post thoracolumbar fusion. IMPRESSION: No significant change in chronic bibasilar atelectasis or scarring. No acute findings demonstrated. Electronically Signed   By: Richardean Sale M.D.   On: 01/04/2015 10:29   Portable Chest 1 View  01/01/2015  CLINICAL DATA:  Shortness of breath, cough, dyspnea.  Cerebral palsy EXAM: PORTABLE CHEST 1 VIEW COMPARISON:  Yesterday FINDINGS: Porta catheter on the right with tip in good position at the SVC level. Persistent hypoventilation and linear opacities at the bases. There is no edema, consolidation, effusion, or pneumothorax. Normal heart size and mediastinal contours. Extensive spinal fixation. IMPRESSION: Chronic hypoventilation with basilar atelectasis or scarring. Electronically Signed   By: Monte Fantasia M.D.   On: 01/01/2015 07:40   Dg Chest Port 1 View  12/31/2014  CLINICAL DATA:  Chest pain recent pneumonia persistent fever for 1 week EXAM: PORTABLE CHEST 1 VIEW COMPARISON:  10/19/2014 FINDINGS: Spinal rods noted. Port-A-Cath stable on the right. Low lung volumes with bibasilar atelectasis. Heart size normal. IMPRESSION: Mild bilateral lower lobe atelectasis. No definite evidence of pneumonia. Subtle infiltrate could be due obscured by the bilateral lower lobe atelectasis however. Electronically Signed   By: Skipper Cliche M.D.   On: 12/31/2014 17:39   Dg Abd Portable 1v  01/19/2015  CLINICAL DATA:  Distended abdomen.  Sepsis. EXAM: PORTABLE ABDOMEN -  1 VIEW COMPARISON:  01/18/2015 and prior exams FINDINGS: Gaseous distention of the colon has decreased. No dilated small bowel loops are noted. Spinal surgical hardware and spinal pump again noted. Enteric tube again noted. IMPRESSION: Decreased colonic distention.  Electronically Signed   By: Margarette Canada M.D.   On: 01/19/2015 14:30   Dg Abd Portable 1v  01/18/2015  CLINICAL DATA:  Gaseous abdominal distention EXAM: PORTABLE ABDOMEN - 1 VIEW COMPARISON:  01/15/2015 FINDINGS: Mild gas distention of ascending and transverse colon. Transverse colon measures up to 8.9 cm diameter. Scattered gas remainder of abdomen including sigmoid colon. No definite bowel wall thickening. Extensive prior spinal fixation anchored into pelvis. Osseous demineralization. Jejunostomy through gastrostomy tube again noted. RIGHT lower quadrant infusion pump with catheter extending to spine. IMPRESSION: Mild gas distention of the proximal half of colon known significant gas is also seen in the sigmoid colon, question ileus. Electronically Signed   By: Lavonia Dana M.D.   On: 01/18/2015 12:36   Dg Abd Portable 1v  01/15/2015  CLINICAL DATA:  Abdominal distension. EXAM: PORTABLE ABDOMEN - 1 VIEW COMPARISON:  10/15/2014 FINDINGS: Spinal hardware throughout the thoracolumbar spine extending into the pelvis. Chronic changes involving the pelvic bones and hips. There is a GJ feeding tube and the tip is probably in the jejunal region. Again noted is an implant infusion device in the right lower abdomen. Few gas-filled loops of bowel but nonobstructive pattern. IMPRESSION: No acute abnormality. Electronically Signed   By: Markus Daft M.D.   On: 01/15/2015 13:08   US Abdomen Limited Ruq  01/05/2015  CLINICAL DATA:  Elevated liver function tests. Cerebral palsy. Feeding tube and implanted pain pump in the right upper quadrant of the abdomen overlying the gallbladder area. EXAM: US ABDOMEN LIMITED - RIGHT UPPER QUADRANT COMPARISON:  Limited  abdomen ultrasound dated 11/06/2014. Abdomen CT dated 10/15/2014. FINDINGS: Gallbladder: Multiple gallstones in the gallbladder measuring up to 7 mm in maximum diameter each. No gallbladder wall thickening or pericholecystic fluid. No apparent pain over the gallbladder. Common bile duct: Diameter: 3.8 mm Liver: Normal echogenicity.  No mass or biliary ductal dilatation seen. IMPRESSION: 1. Cholelithiasis without evidence cholecystitis. 2. Otherwise, unremarkable examination. Electronically Signed   By: Claudie Revering M.D.   On: 01/05/2015 15:59    Oren Binet, MD  [facility] Pager:[phone]  If 7PM-7AM, please contact night-coverage [email or web address] Password [email or web address] 01/20/2015, 2:05 PM   LOS: 8 days

## 2015-01-21 ENCOUNTER — Ambulatory Visit: Payer: Medicaid Other | Admitting: Neurology

## 2015-01-21 ENCOUNTER — Inpatient Hospital Stay (HOSPITAL_COMMUNITY): Payer: Medicaid Other

## 2015-01-21 DIAGNOSIS — Z515 Encounter for palliative care: Secondary | ICD-10-CM | POA: Insufficient documentation

## 2015-01-21 DIAGNOSIS — J156 Pneumonia due to other aerobic Gram-negative bacteria: Secondary | ICD-10-CM | POA: Insufficient documentation

## 2015-01-21 DIAGNOSIS — K117 Disturbances of salivary secretion: Secondary | ICD-10-CM | POA: Insufficient documentation

## 2015-01-21 DIAGNOSIS — R140 Abdominal distension (gaseous): Secondary | ICD-10-CM

## 2015-01-21 DIAGNOSIS — R197 Diarrhea, unspecified: Secondary | ICD-10-CM | POA: Insufficient documentation

## 2015-01-21 LAB — BASIC METABOLIC PANEL
Anion gap: 13 (ref 5–15)
BUN: 20 mg/dL (ref 6–20)
CHLORIDE: 118 mmol/L — AB (ref 101–111)
CO2: 17 mmol/L — AB (ref 22–32)
CREATININE: 1.05 mg/dL (ref 0.61–1.24)
Calcium: 8.6 mg/dL — ABNORMAL LOW (ref 8.9–10.3)
GFR calc non Af Amer: >60 mL/min (ref >60)
Glucose, Bld: 103 mg/dL — ABNORMAL HIGH (ref 65–99)
POTASSIUM: 4.1 mmol/L (ref 3.5–5.1)
SODIUM: 148 mmol/L — AB (ref 135–145)

## 2015-01-21 LAB — CULTURE, RESPIRATORY W GRAM STAIN

## 2015-01-21 LAB — CBC WITH DIFFERENTIAL/PLATELET
BASOS PCT: 0 %
Basophils Absolute: 0 10*3/uL (ref 0.0–0.1)
EOS PCT: 3 %
Eosinophils Absolute: 0.1 10*3/uL (ref 0.0–0.7)
HEMATOCRIT: 35 % — AB (ref 39.0–52.0)
HEMOGLOBIN: 10.4 g/dL — AB (ref 13.0–17.0)
LYMPHS ABS: 0.3 10*3/uL — AB (ref 0.7–4.0)
Lymphocytes Relative: 11 %
MCH: 30.8 pg (ref 26.0–34.0)
MCHC: 29.7 g/dL — AB (ref 30.0–36.0)
MCV: 103.6 fL — ABNORMAL HIGH (ref 78.0–100.0)
MONO ABS: 0.4 10*3/uL (ref 0.1–1.0)
MONOS PCT: 15 %
Neutro Abs: 2 10*3/uL (ref 1.7–7.7)
Neutrophils Relative %: 71 %
Platelets: 97 10*3/uL — ABNORMAL LOW (ref 150–400)
RBC: 3.38 MIL/uL — AB (ref 4.22–5.81)
RDW: 21.1 % — AB (ref 11.5–15.5)
WBC: 2.8 10*3/uL — ABNORMAL LOW (ref 4.0–10.5)

## 2015-01-21 LAB — GLUCOSE, CAPILLARY
GLUCOSE-CAPILLARY: 104 mg/dL — AB (ref 65–99)
GLUCOSE-CAPILLARY: 109 mg/dL — AB (ref 65–99)
GLUCOSE-CAPILLARY: 86 mg/dL (ref 65–99)
GLUCOSE-CAPILLARY: 94 mg/dL (ref 65–99)
Glucose-Capillary: 80 mg/dL (ref 65–99)
Glucose-Capillary: 84 mg/dL (ref 65–99)

## 2015-01-21 LAB — PREALBUMIN: Prealbumin: 14.8 mg/dL — ABNORMAL LOW (ref 18–38)

## 2015-01-21 LAB — CULTURE, RESPIRATORY: GRAM STAIN: NONE SEEN

## 2015-01-21 MED ORDER — ALOSETRON HCL 1 MG PO TABS
1.0000 mg | ORAL_TABLET | Freq: Two times a day (BID) | ORAL | Status: DC
  Administered 2015-01-23: 1 mg via ORAL
  Filled 2015-01-21 (×6): qty 1

## 2015-01-21 MED ORDER — DIPHENHYDRAMINE HCL 50 MG/ML IJ SOLN
12.5000 mg | Freq: Once | INTRAMUSCULAR | Status: AC
Start: 2015-01-21 — End: 2015-01-21
  Administered 2015-01-21: 12.5 mg via INTRAVENOUS
  Filled 2015-01-21: qty 1

## 2015-01-21 MED ORDER — PB-HYOSCY-ATROPINE-SCOPOLAMINE 16.2 MG/5ML PO ELIX
10.0000 mL | ORAL_SOLUTION | Freq: Three times a day (TID) | ORAL | Status: DC
  Administered 2015-01-21 – 2015-01-24 (×8): 32.4 mg
  Filled 2015-01-21 (×11): qty 10

## 2015-01-21 MED ORDER — MORPHINE SULFATE (PF) 2 MG/ML IV SOLN
1.0000 mg | INTRAVENOUS | Status: DC | PRN
  Administered 2015-01-21 (×2): 1 mg via INTRAVENOUS
  Filled 2015-01-21 (×2): qty 1

## 2015-01-21 MED ORDER — SODIUM CHLORIDE 0.9 % IV SOLN
50.0000 mg | Freq: Two times a day (BID) | INTRAVENOUS | Status: DC
  Administered 2015-01-22 – 2015-01-24 (×5): 50 mg via INTRAVENOUS
  Filled 2015-01-21 (×7): qty 100

## 2015-01-21 MED ORDER — FILGRASTIM 480 MCG/1.6ML IJ SOLN
480.0000 ug | Freq: Once | INTRAMUSCULAR | Status: AC
  Administered 2015-01-21: 480 ug via SUBCUTANEOUS
  Filled 2015-01-21: qty 1.6

## 2015-01-21 MED ORDER — SACCHAROMYCES BOULARDII 250 MG PO CAPS
250.0000 mg | ORAL_CAPSULE | Freq: Two times a day (BID) | ORAL | Status: DC
  Administered 2015-01-21 – 2015-01-24 (×5): 250 mg via ORAL
  Filled 2015-01-21 (×7): qty 1

## 2015-01-21 MED ORDER — GLYCOPYRROLATE 0.2 MG/ML IJ SOLN
0.4000 mg | Freq: Four times a day (QID) | INTRAMUSCULAR | Status: DC
  Filled 2015-01-21 (×3): qty 2

## 2015-01-21 MED ORDER — CALCIUM CARBONATE 1250 MG/5ML PO SUSP: 1250.0000 mg | Freq: Three times a day (TID) | ORAL | Status: DC

## 2015-01-21 MED ORDER — FOLIC ACID 1 MG PO TABS
1.0000 mg | ORAL_TABLET | Freq: Every day | ORAL | Status: DC
  Administered 2015-01-22 – 2015-01-23 (×2): 1 mg via ORAL
  Filled 2015-01-21 (×2): qty 1

## 2015-01-21 MED ORDER — TIGECYCLINE 50 MG IV SOLR
100.0000 mg | Freq: Once | INTRAVENOUS | Status: AC
  Administered 2015-01-21: 100 mg via INTRAVENOUS
  Filled 2015-01-21: qty 100

## 2015-01-21 MED ORDER — ADULT MULTIVITAMIN LIQUID CH
5.0000 mL | Freq: Two times a day (BID) | ORAL | Status: DC
  Filled 2015-01-21 (×2): qty 5

## 2015-01-21 MED ORDER — LOPERAMIDE HCL 1 MG/5ML PO LIQD
2.0000 mg | Freq: Four times a day (QID) | ORAL | Status: AC
  Administered 2015-01-21 – 2015-01-23 (×6): 2 mg
  Filled 2015-01-21 (×9): qty 10

## 2015-01-21 NOTE — Progress Notes (Signed)
[name] for Infectious Disease    Subjective:  Non verbal, we were called back by team due to patients refractory voluminous diarrhea which per Dad started prior to hospitalization  Antibiotics:  Anti-infectives    Start     Dose/Rate Route Frequency Ordered Stop   01/22/15 0400  tigecycline (TYGACIL) 50 mg in sodium chloride 0.9 % 100 mL IVPB     50 mg 200 mL/hr over 30 Minutes Intravenous Every 12 hours 01/21/15 1526     01/21/15 1600  tigecycline (TYGACIL) 100 mg in sodium chloride 0.9 % 100 mL IVPB     100 mg 200 mL/hr over 30 Minutes Intravenous  Once 01/21/15 1526     01/20/15 1800  ceftaroline (TEFLARO) 600 mg in sodium chloride 0.9 % 250 mL IVPB  Status:  Discontinued     600 mg 250 mL/hr over 60 Minutes Intravenous Every 12 hours 01/20/15 1405 01/21/15 1526   01/14/15 1200  cefTAZidime (FORTAZ) 2 g in dextrose 5 % 50 mL IVPB  Status:  Discontinued     2 g 100 mL/hr over 30 Minutes Intravenous Every 24 hours 01/13/15 1344 01/13/15 1553   01/13/15 1800  ceftaroline (TEFLARO) 300 mg in sodium chloride 0.9 % 250 mL IVPB  Status:  Discontinued     300 mg 250 mL/hr over 60 Minutes Intravenous Every 12 hours 01/13/15 1558 01/20/15 1405   01/13/15 1700  ceftaroline (TEFLARO) 300 mg in sodium chloride 0.9 % 250 mL IVPB  Status:  Discontinued     300 mg 250 mL/hr over 60 Minutes Intravenous Every 12 hours 01/13/15 1557 01/13/15 1558   01/13/15 1600  metroNIDAZOLE (FLAGYL) IVPB 500 mg  Status:  Discontinued     500 mg 100 mL/hr over 60 Minutes Intravenous Every 8 hours 01/13/15 1554 01/21/15 1526   01/13/15 0845  linezolid (ZYVOX) IVPB 600 mg  Status:  Discontinued     600 mg 300 mL/hr over 60 Minutes Intravenous Every 12 hours 01/25/2015 2345 01/23/2015 2350   01/22/2015 2200  linezolid (ZYVOX) IVPB 600 mg  Status:  Discontinued     600 mg 300 mL/hr over 60 Minutes Intravenous Every 12 hours 01/31/2015 2020 01/13/15 1532   01/05/2015 2200  cefTAZidime (FORTAZ) 2 g  in dextrose 5 % 50 mL IVPB  Status:  Discontinued     2 g 100 mL/hr over 30 Minutes Intravenous Every 12 hours 01/30/2015 2034 01/13/15 1344      Medications: Scheduled Meds: . acetaminophen  650 mg Oral Once  . [START ON 01/22/2015] alosetron  1 mg Oral BID  . antiseptic oral rinse  7 mL Mouth Rinse q12n4p  . belladonna-PHENObarbital  10 mL Per Tube TID  . chlorhexidine  15 mL Mouth Rinse BID  . cholestyramine  4 g Per Tube TID  . cloBAZam  3.75 mg Per Tube TID AC  . [facility] Beanaid  2 capsule Per J Tube TID  . diphenoxylate-atropine  10 mL Per Tube QID  . feeding supplement (PRO-STAT SUGAR FREE 64)  30 mL Per Tube TID  . folic acid  1 mg Oral Daily  . free water  180 mL Per Tube 3 times per day  . gabapentin  900 mg Per Tube 3 times per day  . levalbuterol  0.63 mg Nebulization Q6H  . loperamide  2 mg Per Tube Q6H  . pantoprazole sodium  40 mg Per Tube BID  . potassium chloride  40 mEq Per Tube Daily  . saccharomyces boulardii  250 mg Oral BID  . sodium chloride  3 mL Intravenous Q12H  . tigecycline (TYGACIL) IVPB  100 mg Intravenous Once   Followed by  . [START ON 01/22/2015] tigecycline (TYGACIL) IVPB  50 mg Intravenous Q12H   Continuous Infusions: . baclofen    . dextrose 5 % 1,000 mL with potassium chloride 20 mEq infusion 125 mL/hr at 01/21/15 0500  . TWOCAL HN 237 mL (01/17/15 1000)   PRN Meds:.acetaminophen (TYLENOL) oral liquid 160 mg/5 mL **OR** acetaminophen, dextrose, dicyclomine, ibuprofen, ketoconazole, levalbuterol, zinc oxide    Objective: Weight change:   Intake/Output Summary (Last 24 hours) at 01/21/15 1532 Last data filed at 01/21/15 1100  Gross per 24 hour  Intake   3525 ml  Output   4050 ml  Net   -525 ml   Blood pressure 111/73, pulse 108, temperature 98.9 F (37.2 C), temperature source Rectal, resp. rate 26, height 4\' 11"  (1.499 m), weight 129 lb 10.1 oz (58.8 kg), SpO2 98 %. Temp:  [98.4 F (36.9 C)-100 F (37.8 C)] 98.9 F (37.2 C)  (12/20 1100) Pulse Rate:  [108-127] 108 (12/20 1452) Resp:  [22-40] 26 (12/20 1452) BP: (98-111)/(49-73) 111/73 mmHg (12/20 0400) SpO2:  [91 %-99 %] 98 % (12/20 1452) FiO2 (%):  [50 %-55 %] 50 % (12/20 1452)  Physical Exam: General: awake, coughing wearing headgear HEENT:, EOMI CVS  Tachycardic regular rate, normal r,  no murmur rubs or gallops Chest: , few rhonchi scattered Abdomen: soft slightly distended normal bowel sounds, Extremities:contractures Skin: no rashes Neuro: nonfocal  CBC:  CBC Latest Ref Rng 01/21/2015 01/20/2015 01/19/2015  WBC 4.0 - 10.5 K/uL 2.8(L) 3.6(L) 4.2  Hemoglobin 13.0 - 17.0 g/dL 10.4(L) 9.2(L) 9.4(L)  Hematocrit 39.0 - 52.0 % 35.0(L) 31.1(L) 30.9(L)  Platelets 150 - 400 K/uL 97(L) 85(L) 72(L)      BMET  Recent Labs  01/20/15 0420 01/21/15 0435  NA 148* 148*  K 4.7 4.1  CL 118* 118*  CO2 18* 17*  GLUCOSE 179* 103*  BUN 20 20  CREATININE 1.18 1.05  CALCIUM 8.3* 8.6*     Liver Panel   Recent Labs  01/19/15 0310  PROT 4.0*  ALBUMIN 1.5*  AST 19  ALT 20  ALKPHOS 469*  BILITOT 0.4       Sedimentation Rate No results for input(s): ESRSEDRATE in the last 72 hours. C-Reactive Protein No results for input(s): CRP in the last 72 hours.  Micro Results: Recent Results (from the past 720 hour(s))  Blood Culture (routine x 2)     Status: None   Collection Time: 12/31/14  2:51 PM  Result Value Ref Range Status   Specimen Description BLOOD PORTA CATH  Final   Special Requests BOTTLES DRAWN AEROBIC AND ANAEROBIC 6CC   Final   Culture NO GROWTH 5 DAYS  Final   Report Status 01/05/2015 FINAL  Final  Urine culture     Status: None   Collection Time: 12/31/14  2:54 PM  Result Value Ref Range Status   Specimen Description URINE, CATHETERIZED  Final   Special Requests NONE  Final   Culture MULTIPLE SPECIES PRESENT, SUGGEST RECOLLECTION  Final   Report Status 01/01/2015 FINAL  Final  Blood Culture (routine x 2)     Status: None     Collection Time: 12/31/14  3:50 PM  Result Value Ref Range Status   Specimen Description BLOOD LEFT HAND  Final   Special  Requests BOTTLES DRAWN AEROBIC AND ANAEROBIC 5CC  Final   Culture NO GROWTH 5 DAYS  Final   Report Status 01/05/2015 FINAL  Final  MRSA PCR Screening     Status: None   Collection Time: 12/31/14  7:17 PM  Result Value Ref Range Status   MRSA by PCR NEGATIVE NEGATIVE Final    Comment:        The GeneXpert MRSA Assay (FDA approved for NASAL specimens only), is one component of a comprehensive MRSA colonization surveillance program. It is not intended to diagnose MRSA infection nor to guide or monitor treatment for MRSA infections.   Urine culture     Status: None   Collection Time: 01/02/15  5:14 AM  Result Value Ref Range Status   Specimen Description URINE, RANDOM  Final   Special Requests NONE  Final   Culture 10,000 COLONIES/mL YEAST  Final   Report Status 01/03/2015 FINAL  Final  C difficile quick scan w PCR reflex     Status: None   Collection Time: 01/04/15  4:01 PM  Result Value Ref Range Status   C Diff antigen NEGATIVE NEGATIVE Final   C Diff toxin NEGATIVE NEGATIVE Final   C Diff interpretation Negative for toxigenic C. difficile  Final  Culture, blood (routine x 2)     Status: None   Collection Time: 01/16/2015  2:21 PM  Result Value Ref Range Status   Specimen Description PORTA CATH A-LINE  Final   Special Requests BOTTLES DRAWN AEROBIC AND ANAEROBIC 5ML  Final   Culture NO GROWTH 5 DAYS  Final   Report Status 01/17/2015 FINAL  Final  Culture, blood (routine x 2)     Status: None   Collection Time: 02/01/2015  7:30 PM  Result Value Ref Range Status   Specimen Description BLOOD LEFT HAND  Final   Special Requests PEDIATRICS 2CC  Final   Culture NO GROWTH 5 DAYS  Final   Report Status 01/17/2015 FINAL  Final  Urine culture     Status: None   Collection Time: 01/11/2015  7:44 PM  Result Value Ref Range Status   Specimen Description URINE,  RANDOM  Final   Special Requests PEDS BAG  Final   Culture 4,000 COLONIES/mL INSIGNIFICANT GROWTH  Final   Report Status 01/13/2015 FINAL  Final  C difficile quick scan w PCR reflex     Status: None   Collection Time: 01/13/15 12:45 AM  Result Value Ref Range Status   C Diff antigen NEGATIVE NEGATIVE Final   C Diff toxin NEGATIVE NEGATIVE Final   C Diff interpretation Negative for toxigenic C. difficile  Final  Urine culture     Status: None   Collection Time: 01/16/15  4:15 PM  Result Value Ref Range Status   Specimen Description URINE, CATHETERIZED  Final   Special Requests NONE  Final   Culture NO GROWTH 1 DAY  Final   Report Status 01/17/2015 FINAL  Final  Culture, respiratory (NON-Expectorated)     Status: None   Collection Time: 01/17/15 11:55 AM  Result Value Ref Range Status   Specimen Description ASPIRATE  Final   Special Requests NASAL ASPIRATE Immunocompromised  Final   Gram Stain   Final    NO WBC SEEN ABUNDANT SQUAMOUS EPITHELIAL CELLS PRESENT RARE GRAM NEGATIVE RODS Performed at [facility]    Culture   Final    MODERATE ENTEROBACTER AEROGENES Performed at [facility]    Report Status 01/21/2015 FINAL  Final   Organism ID, Bacteria ENTEROBACTER AEROGENES  Final      Susceptibility   Enterobacter aerogenes - MIC*    CEFAZOLIN >=64 RESISTANT Resistant     CEFEPIME <=1 SENSITIVE Sensitive     CEFTAZIDIME >=64 RESISTANT Resistant     CEFTRIAXONE >=64 RESISTANT Resistant     CIPROFLOXACIN <=0.25 SENSITIVE Sensitive     GENTAMICIN <=1 SENSITIVE Sensitive     IMIPENEM <=0.25 SENSITIVE Sensitive     PIP/TAZO >=128 RESISTANT Resistant     TOBRAMYCIN <=1 SENSITIVE Sensitive     TRIMETH/SULFA Value in next row Sensitive      <=20 SENSITIVE(NOTE)    AMIKACIN Value in next row Sensitive      <=20 SENSITIVE(NOTE)    * MODERATE ENTEROBACTER AEROGENES  C difficile quick scan w PCR reflex     Status: None   Collection Time: 01/19/15  7:40 AM  Result  Value Ref Range Status   C Diff antigen NEGATIVE NEGATIVE Final   C Diff toxin NEGATIVE NEGATIVE Final   C Diff interpretation Negative for toxigenic C. difficile  Final    Studies/Results: Dg Chest Port 1 View  01/21/2015  CLINICAL DATA:  Leukopenia. EXAM: PORTABLE CHEST 1 VIEW COMPARISON:  01/16/2015 FINDINGS: Right-sided Port-A-Cath terminates over the lower SVC, unchanged. Cardiomediastinal silhouette is unchanged. The lungs remain hypoinflated. There is mild right basilar opacity which has improved. Left basilar greater than mid lung opacities have worsened. No large pleural effusion or definite pneumothorax is identified. Spinal fusion hardware is noted. IMPRESSION: Persistent hypoinflation with improved aeration of the right lung base. Worsening left lung aeration concerning for pneumonia. Electronically Signed   By: Logan Bores M.D.   On: 01/21/2015 15:02      Assessment/Plan:  INTERVAL HISTORY:   01/21/15: diarrhea worse, becoming more leukopenic    Principal Problem:   Severe sepsis ([place]) Active Problems:   Acute on chronic respiratory failure with hypoxia (HCC)   Cerebral palsy, quadriplegic (HCC)   Acquired pancytopenia (HCC)   Transaminitis   Seizures (HCC)   Pressure ulcer   Osteomyelitis (HCC)   Malnutrition (HCC)   Hypernatremia   AKI (acute kidney injury) ([place])   PNA (pneumonia)   Abdominal distension, gaseous   Septic shock (HCC)   Osteomyelitis, pelvic region and thigh (HCC)   Increased oropharyngeal secretions   Encounter for palliative care    Jeffery [name] is a 24 y.o. male with  Cerebral palsy, quadraplegia, pelvic osteomyelitis left ischium dc on invanz with vanomcyin levels having been supratherapeutic, admitted with septic picture with fevers, ? Aspiration event. Vancomycin levels were now undetectable. He was given zyvox and ceftazidime but developed TTPenia from zyvox now on teflaro and flagyl. He has had voluminous diarrhea that started as  an outpatient and has only worsened. Mx Cdiff tests negative though he received some flagyl. He is growing an Enterobacter from "sputum culture" that was felt not to be representative of true lower airway.  #1 Sepsis;  I will juggle his antibiotics ONCE MORE in hopes of covering both his pelvic osteomyelitis and his aspiration PNA and potentially alleviating his diarrhea--I am hignly skeptical that we will do that  I will change him to Tygacil  Which will cover MRSA, anerobes and should cover the enterobacter  It typically causes nausea. It does have activity vs C diff like the flagyl   #2 Aspiration PNA: See above change to tygcacil  #3 Diarrhea: see GI recs. I would NOT give florastor as  can cause FUNGEMIA esp in compromised pt such as this in whom TPN is being considered  One COULD consider a "holiday from antibiotics to give his GI tract chance to recover but he could decompensate from resp and pelvic osteo standpoint it may still be worth donig  #4 Neutropenia: to get nupogen  #5 TTPenia from zyvox  #6 Goals of care: needs to continue to have further palliative care conversations. We are NOT making headway for short term or long term quality of life     LOS: 9 days   Alcide Evener 01/21/2015, 3:32 PM

## 2015-01-21 NOTE — Progress Notes (Signed)
Pt taken off of BIPAP, placed on 55%VM. Pt tolerating well at this time. No distress noted, RR 26-28, HR 113, SpO2 96%. Attempting to stay off BIPAP x1hour. Will place back on if appears to be in any more respiratory distress. RT Will continue to monitor.

## 2015-01-21 NOTE — Progress Notes (Signed)
Patient RR in the 40's and patient SPO2 80's. NTS and received moderate Meta Kroenke sputum. SPO2 increased and patient placed back on BIPAP. RT will continue to monitor. RN at bedside.

## 2015-01-21 NOTE — Progress Notes (Signed)
   01/21/15 1500  Clinical Encounter Type  Visited With Family  Visit Type Follow-up;Psychological support;Spiritual support;Social support  Spiritual Encounters  Spiritual Needs Emotional;Grief support  CH checed in with pt; pt mother stopped in to pray with pt; status quo otherwise; Family looking for miracle with new meds;

## 2015-01-21 NOTE — Progress Notes (Signed)
UR COMPLETED  

## 2015-01-21 NOTE — Care Management Note (Addendum)
Case Management Note  Patient Details  Name: Jeffery [name] MRN: [id number] Date of Birth: Jul 24, 1990   Subjective/Objective:                 Admitted with Sepsis, hx of  cerebral palsy and spastic quadriplegia-history of seizures-now with recurrent admissions for sepsis from aspiration pneumonia and ongoing sacral osteomyelitis.   Action Plans:  Palliative following......Marland KitchenFamily is reluctant to start comfort measures/hospice care, and are hopeful to take patient home when electrolytes/diarrhea stable. .  Expected Discharge Date:                  Expected Discharge Plan:  [place]  In-House Referral:     Discharge planning Services  CM Consult  Post Acute Care Choice:  Resumption of Svcs/PTA Provider Choice offered to:  Patient  DME Arranged:  IV pump/equipment DME Agency:  Other - Comment ([place])  [place] Arranged:  RN [place] Agency:  [place]  Status of Service:  In process, will continue to follow  Medicare Important Message Given:    Date Medicare IM Given:    Medicare IM give by:    Date Additional Medicare IM Given:    Additional Medicare Important Message give by:     If discussed at [place] of Stay Meetings, dates discussed:    Additional Comments: [id number], [place], [place]     [phone]  [name] (Legal Guardian) [phone], Evonnie Dawes (Other) [phone]  Whitman Hero [place], [place] [phone] 01/21/2015, 9:30 PM

## 2015-01-21 NOTE — Progress Notes (Addendum)
PATIENT DETAILS Name: Jeffery [name] Age: 24 y.o. Sex: male Date of Birth: 1990/09/09 Admit Date: 01/23/2015 Admitting Physician Edwin Dada, MD [id number], DAVID C, MD  Brief narrative: 24 year old male with history of cerebral palsy with spastic quadriplegia-baclofen pump in place, seizure disorder, history of pancytopenia requiring frequent PRBC transfusion/Neupogen injections, chronic respiratory failure requiring oxygen and BiPAP at home, severe dysphagia on J-tube feedings-recently diagnosed with sacral osteomyelitis and discharged home with IV Invanz on 12/6, readmitted to the hospital on 12/11 with severe sepsis likely secondary from aspiration pneumonia. Profoundly hypernatremic on admission. Admitted and started on broad-spectrum antibiotics, infectious disease was consulted. Unfortunately hospital course has been complicated by development of repeated episodes of aspiration causing respiratory distress with worsening hypoxia-requiring BiPAP support. Hospital course has also been complicated by persistent diarrhea causing hypernatremia. Family reluctant to transition to comfort measures, however DO NOT RESUSCITATE remains in place.  Subjective: On BiPAP this morning-diarrhea continues.   Assessment/Plan: Principal Problem: Severe sepsis: Likely secondary to aspiration pneumonia, but also has ongoing osteomyelitis of the sacrum. Sepsis pathophysiology has resolved. Afebrile now for past few days. ID following-the recommendations are to continue with ceftaroline/flagyl till Feb 11 2015. Have reconsulted infectious disease-given persistent diarrhea-re-change antimicrobial therapy.  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  Acute on chronic hypoxic respiratory failure: Hospital course complicated by episodes of acute hypoxic respiratory failure from aspiration pneumonitis (vomiting/pooling of secretions). Family aware that given repeated acute illness-patient is very deconditioned and weaker that usual-which is likely further exacerbating aspiration episodes. Overall slowly improving, continue to use BiPAP/suctioning as needed. Patient's family has refused to meet with palliative care, DO NOT RESUSCITATE remains in place.  Presumed aspiration pneumonia: See above. Has a long-standing issue with pooling of secretions-but seems to have had more frequent episodes over the past few days. Continue when necessary Robinul, suctioning, vibrator vest, bronchodilators etc. see above  Diarrhea: C. difficile PCR 2 negative. Have tried numerous combinations of Imodium, Lomotil and Questran with no improvement. Could be related to antibiotics. Have consulted infectious disease to see if we could change antibiotics-but options limited given hx of vancomycin toxicity, anemia/thrombocytopenia. Have also consulted gastroenterology and Palliative care for symptom management. May need to start TNA if no improvement in diarrhea.  Hypernatremia: Suspect secondary to dehydration from sepsis, inability to start tube feeds due to aspiration/resp failure and diarrhea. Slowly improving, Continue IV fluids at 125 mL an hour  Hypokalemia: Repleted.Likely secondary to diarrhea.  ARF: Likely prerenal azotemia in the setting of sepsis, slowly improving.  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 Mother reports that LFTs worsens after he gets Neupogen. RUQ ultrasound negative for hepatobiliary pathology  Anemia/Leukopenia: Chronic issue-attributed to bone marrow suppression from Keppra and other  medications. Oncology/Hematology following.Received one 1 unit of PRBC and Neupogen 12/17  Sacral decubitus stage IV: Present prior to [facility] care following.  Dysphagia requring chronic J tube feeds: Continue regular tube feeds-is NPO. See above regarding aspiration  History of seizures:Continue Onfi and Neurontin.Ensure follow up with Dr Gaynell Face  Hx of cerebral palsy/spastic quadriplegic: Has a intrathecal baclofen pump in place.   [id number] to neurologic issues-on BIPAP Qhs  Palliative care:DNR in place-unfortunate  24 year old with cerebral palsy and spastic quadriplegia-history of seizures-now with recurrent admissions for sepsis from aspiration pneumonia and  ongoing sacral osteomyelitis. Family aware that not a candidate for further escalation of care-goal is to try and manage this situation with antibiotics as much as possible. Unfortunately, hospital course complicated by development of aspiration pneumonitis and acute respiratory failure-and persistent diarrhea. Family is reluctant to start comfort measures/hospice care, and are hopeful to take patient home when electrolytes/diarrhea stable. Previously, patient's family has refused to meet with palliative care, however after discussion today-agreeable to start talking with palliative care today. I have reached out to both infectious disease and gastroenterology today to see if we can get the patient's diarrhea under better control. Patient's father-Herman was again counseled regarding poor overall health of this frail patient. Poor prognosis was again reemphasized today. However if continues to deteriorate in spite of maximal efforts-may need to revisit comfort care/futility options.  Disposition: Remain inpatient-remain in SDU  Antimicrobial agents  See below  Anti-infectives    Start     Dose/Rate Route Frequency Ordered Stop   01/20/15 1800  ceftaroline (TEFLARO) 600 mg in sodium chloride 0.9 % 250 mL IVPB     600  mg 250 mL/hr over 60 Minutes Intravenous Every 12 hours 01/20/15 1405     01/14/15 1200  cefTAZidime (FORTAZ) 2 g in dextrose 5 % 50 mL IVPB  Status:  Discontinued     2 g 100 mL/hr over 30 Minutes Intravenous Every 24 hours 01/13/15 1344 01/13/15 1553   01/13/15 1800  ceftaroline (TEFLARO) 300 mg in sodium chloride 0.9 % 250 mL IVPB  Status:  Discontinued     300 mg 250 mL/hr over 60 Minutes Intravenous Every 12 hours 01/13/15 1558 01/20/15 1405   01/13/15 1700  ceftaroline (TEFLARO) 300 mg in sodium chloride 0.9 % 250 mL IVPB  Status:  Discontinued     300 mg 250 mL/hr over 60 Minutes Intravenous Every 12 hours 01/13/15 1557 01/13/15 1558   01/13/15 1600  metroNIDAZOLE (FLAGYL) IVPB 500 mg     500 mg 100 mL/hr over 60 Minutes Intravenous Every 8 hours 01/13/15 1554     01/13/15 0845  linezolid (ZYVOX) IVPB 600 mg  Status:  Discontinued     600 mg 300 mL/hr over 60 Minutes Intravenous Every 12 hours 01/28/2015 2345 01/29/2015 2350   01/04/2015 2200  linezolid (ZYVOX) IVPB 600 mg  Status:  Discontinued     600 mg 300 mL/hr over 60 Minutes Intravenous Every 12 hours 01/26/2015 2020 01/13/15 1532   01/16/2015 2200  cefTAZidime (FORTAZ) 2 g in dextrose 5 % 50 mL IVPB  Status:  Discontinued     2 g 100 mL/hr over 30 Minutes Intravenous Every 12 hours 01/22/2015 2034 01/13/15 1344      DVT Prophylaxis: SCD's  Code Status:  DNR  Family Communication Father at bedside Unable to leave msg for mother [name]-as mailbox is full. (Note mother/legal gaurdian not at bedside for last 7 days-I have only been able to get in touch with her 2-3 times on the phone over the past 7 days)  Procedures: None  CONSULTS:  pulmonary/intensive care, ID and hematology/oncology  Time spent 35 minutes-Greater than 50% of this time was spent in counseling, explanation of diagnosis, planning of further management, and coordination of care.  MEDICATIONS: Scheduled Meds: . acetaminophen  650 mg Oral Once  . [START ON  01/22/2015] alosetron  1 mg Oral BID  . antiseptic oral rinse  7  mL Mouth Rinse q12n4p  . ceFTAROline (TEFLARO) IV  600 mg Intravenous Q12H  . chlorhexidine  15 mL Mouth Rinse BID  . cholestyramine  4 g Per Tube TID  . cloBAZam  3.75 mg Per Tube TID AC  . [facility] Beanaid  2 capsule Per J Tube TID  . diphenhydrAMINE  12.5 mg Intravenous Once  . diphenoxylate-atropine  10 mL Per Tube QID  . feeding supplement (PRO-STAT SUGAR FREE 64)  30 mL Per Tube TID  . filgrastim  480 mcg Subcutaneous Once  . folic acid  1 mg Oral Daily  . free water  180 mL Per Tube 3 times per day  . gabapentin  900 mg Per Tube 3 times per day  . glycopyrrolate  0.4 mg Intravenous QID  . levalbuterol  0.63 mg Nebulization Q6H  . loperamide  2 mg Per Tube Q6H  . metronidazole  500 mg Intravenous Q8H  . pantoprazole sodium  40 mg Per Tube BID  . potassium chloride  40 mEq Per Tube Daily  . sodium chloride  3 mL Intravenous Q12H   Continuous Infusions: . baclofen    . dextrose 5 % 1,000 mL with potassium chloride 20 mEq infusion 125 mL/hr at 01/21/15 0500  . TWOCAL HN 237 mL (01/17/15 1000)   PRN Meds:.acetaminophen (TYLENOL) oral liquid 160 mg/5 mL **OR** acetaminophen, dextrose, dicyclomine, ibuprofen, ketoconazole, levalbuterol, zinc oxide    PHYSICAL EXAM: Vital signs in last 24 hours: Filed Vitals:   01/21/15 0916 01/21/15 0934 01/21/15 1100 01/21/15 1233  BP:      Pulse: 121 116  113  Temp:   98.9 F (37.2 C)   TempSrc:   Rectal   Resp: 34 30  26  Height:      Weight:      SpO2: 91% 94%  95%    Weight change:  Filed Weights   01/20/2015 2341  Weight: 58.8 kg (129 lb 10.1 oz)   Body mass index is 26.17 kg/(m^2).  Gen Exam: On BiPAP this am. Not in any distress-awake Neck: Supple Chest: Continues to have some transmitted upper airway sounds CVS: S1 S2 Regular, no murmurs.  Abdomen: soft, mildly distended. G-tube in place Extremities: no edema, lower extremities warm to  touch. Neurologic: Decreased bulk-0 X 5 in all 4 ext   Intake/Output from previous day:  Intake/Output Summary (Last 24 hours) at 01/21/15 1302 Last data filed at 01/21/15 1100  Gross per 24 hour  Intake   3645 ml  Output   4050 ml  Net   -405 ml     LAB RESULTS: CBC  Recent Labs Lab 01/17/15 0400 01/18/15 0330 01/19/15 0310 01/20/15 0420 01/21/15 0435  WBC 4.6 2.3* 4.2 3.6* 2.8*  HGB 8.7* 8.0* 9.4* 9.2* 10.4*  HCT 28.3* 26.7* 30.9* 31.1* 35.0*  PLT 74* 68* 72* 85* 97*  MCV 98.3 101.9* 100.3* 102.0* 103.6*  MCH 30.2 30.5 30.5 30.2 30.8  MCHC 30.7 30.0 30.4 29.6* 29.7*  RDW 21.5* 22.0* 21.0* 21.0* 21.1*  LYMPHSABS 0.5* 0.3* 0.4* 0.3* 0.3*  MONOABS 0.4 0.3 0.3 0.5 0.4  EOSABS 0.2 0.1 0.1 0.1 0.1  BASOSABS 0.0 0.0 0.1 0.0 0.0    Chemistries   Recent Labs Lab 01/17/15 0400 01/18/15 0330 01/19/15 0310 01/20/15 0420 01/21/15 0435  NA 155* 159* 152* 148* 148*  K 3.0* 3.5 3.6 4.7 4.1  CL 125* 127* 124* 118* 118*  CO2 20* 21* 19* 18* 17*  GLUCOSE 107* 87 80 179*  103*  BUN 30* 28* 23* 20 20  CREATININE 1.47* 1.32* 1.21 1.18 1.05  CALCIUM 8.1* 8.2* 8.2* 8.3* 8.6*  MG 1.6*  --   --  1.3*  --     CBG:  Recent Labs Lab 01/20/15 2016 01/20/15 2329 01/21/15 0438 01/21/15 0836 01/21/15 1155  GLUCAP 95 98 109* 94 104*    GFR Estimated Creatinine Clearance: 79.9 mL/min (by C-G formula based on Cr of 1.05).  Coagulation profile No results for input(s): INR, PROTIME in the last 168 hours.  Cardiac Enzymes No results for input(s): CKMB, TROPONINI, MYOGLOBIN in the last 168 hours.  Invalid input(s): CK  Invalid input(s): POCBNP No results for input(s): DDIMER in the last 72 hours. No results for input(s): HGBA1C in the last 72 hours. No results for input(s): CHOL, HDL, LDLCALC, TRIG, CHOLHDL, LDLDIRECT in the last 72 hours. No results for input(s): TSH, T4TOTAL, T3FREE, THYROIDAB in the last 72 hours.  Invalid input(s): FREET3 No results for input(s):  VITAMINB12, FOLATE, FERRITIN, TIBC, IRON, RETICCTPCT in the last 72 hours. No results for input(s): LIPASE, AMYLASE in the last 72 hours.  Urine Studies No results for input(s): UHGB, CRYS in the last 72 hours.  Invalid input(s): UACOL, UAPR, USPG, UPH, UTP, UGL, UKET, UBIL, UNIT, UROB, ULEU, UEPI, UWBC, URBC, UBAC, CAST, UCOM, BILUA  MICROBIOLOGY: Recent Results (from the past 240 hour(s))  Culture, blood (routine x 2)     Status: None   Collection Time: 01/08/2015  2:21 PM  Result Value Ref Range Status   Specimen Description PORTA CATH A-LINE  Final   Special Requests BOTTLES DRAWN AEROBIC AND ANAEROBIC 5ML  Final   Culture NO GROWTH 5 DAYS  Final   Report Status 01/17/2015 FINAL  Final  Culture, blood (routine x 2)     Status: None   Collection Time: 01/25/2015  7:30 PM  Result Value Ref Range Status   Specimen Description BLOOD LEFT HAND  Final   Special Requests PEDIATRICS 2CC  Final   Culture NO GROWTH 5 DAYS  Final   Report Status 01/17/2015 FINAL  Final  Urine culture     Status: None   Collection Time: 01/31/2015  7:44 PM  Result Value Ref Range Status   Specimen Description URINE, RANDOM  Final   Special Requests PEDS BAG  Final   Culture 4,000 COLONIES/mL INSIGNIFICANT GROWTH  Final   Report Status 01/13/2015 FINAL  Final  C difficile quick scan w PCR reflex     Status: None   Collection Time: 01/13/15 12:45 AM  Result Value Ref Range Status   C Diff antigen NEGATIVE NEGATIVE Final   C Diff toxin NEGATIVE NEGATIVE Final   C Diff interpretation Negative for toxigenic C. difficile  Final  Urine culture     Status: None   Collection Time: 01/16/15  4:15 PM  Result Value Ref Range Status   Specimen Description URINE, CATHETERIZED  Final   Special Requests NONE  Final   Culture NO GROWTH 1 DAY  Final   Report Status 01/17/2015 FINAL  Final  Culture, respiratory (NON-Expectorated)     Status: None   Collection Time: 01/17/15 11:55 AM  Result Value Ref Range Status    Specimen Description ASPIRATE  Final   Special Requests NASAL ASPIRATE Immunocompromised  Final   Gram Stain   Final    NO WBC SEEN ABUNDANT SQUAMOUS EPITHELIAL CELLS PRESENT RARE GRAM NEGATIVE RODS Performed at [employer]  Final    MODERATE ENTEROBACTER AEROGENES Performed at [facility]    Report Status 01/21/2015 FINAL  Final   Organism ID, Bacteria ENTEROBACTER AEROGENES  Final      Susceptibility   Enterobacter aerogenes - MIC*    CEFAZOLIN >=64 RESISTANT Resistant     CEFEPIME <=1 SENSITIVE Sensitive     CEFTAZIDIME >=64 RESISTANT Resistant     CEFTRIAXONE >=64 RESISTANT Resistant     CIPROFLOXACIN <=0.25 SENSITIVE Sensitive     GENTAMICIN <=1 SENSITIVE Sensitive     IMIPENEM <=0.25 SENSITIVE Sensitive     PIP/TAZO >=128 RESISTANT Resistant     TOBRAMYCIN <=1 SENSITIVE Sensitive     TRIMETH/SULFA Value in next row Sensitive      <=20 SENSITIVE(NOTE)    AMIKACIN Value in next row Sensitive      <=20 SENSITIVE(NOTE)    * MODERATE ENTEROBACTER AEROGENES  C difficile quick scan w PCR reflex     Status: None   Collection Time: 01/19/15  7:40 AM  Result Value Ref Range Status   C Diff antigen NEGATIVE NEGATIVE Final   C Diff toxin NEGATIVE NEGATIVE Final   C Diff interpretation Negative for toxigenic C. difficile  Final    RADIOLOGY STUDIES/RESULTS: Ct Pelvis Wo Contrast  01/01/2015  CLINICAL DATA:  Cerebral palsy.  Clinical concern for osteomyelitis. EXAM: CT PELVIS WITHOUT CONTRAST TECHNIQUE: Multidetector CT imaging of the pelvis was performed following the standard protocol without intravenous contrast. COMPARISON:  10/15/2014 CT abdomen/pelvis. FINDINGS: Reproductive: Normal prostate and seminal vesicles. Bladder: Collapsed and grossly normal urinary bladder. Visualized ureters are normal caliber. Bowel: Visualized small and large bowel are normal caliber with no bowel wall thickening. Oral contrast traverses to the distal rectum. Partially  visualized is an enteric tube in the distal duodenum and proximal jejunum, with the tip not seen on this study. Vascular/Lymphatic: No pathologically enlarged lymph nodes in the pelvis. Other: No pneumoperitoneum, ascites or focal fluid collection. Musculoskeletal: There is curvilinear gas, fat stranding and ill-defined fluid in the posterior medial left gluteal subcutaneous soft tissues (series 4/ image 125), suggestive of an incompletely visualized decubitus ulcer, with extension of the fat stranding and fluid to the inferior margin of the left ischium. There is mild focal cortical erosion at the inferior left ischium with associated periosteal reaction, in keeping with osteomyelitis. No presacral fluid or fat stranding. No evidence of a sacral decubitus ulcer. Subcutaneous ventral right lower quadrant spinal stimulator device is noted with lead coursing posteriorly and entering the lumbar spinal canal, with the tip not visualized on this study. No aggressive appearing focal osseous lesions. 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 There is stable diffuse osteopenia. No osseous fracture. No additional focal cortical erosions or periosteal reaction. Partially visualized is bilateral posterior spinal fusion hardware with posterior bilateral fusion rods and interlocking screws extending into the bilateral iliac wings, with no evidence of hardware fracture or loosening. IMPRESSION: 1. Osteomyelitis of the inferior left ischium associated with curvilinear fat stranding, fluid and gas in the posteromedial left gluteal subcutaneous soft tissues, presumably due to a medial left gluteal decubitus ulcer (which is incompletely visualized on this study). No focal drainable fluid collection. 2. No pelvic ascites or intraperitoneal fluid collection. No presacral fluid  or inflammatory change. 3. 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 Electronically Signed   By: Janina Mayo.D.  On: 01/01/2015 10:34   US Abdomen Complete  01/13/2015  CLINICAL DATA:  Acute onset of abdominal distention and sepsis. Initial encounter. EXAM: ULTRASOUND ABDOMEN COMPLETE COMPARISON:  Right upper quadrant ultrasound performed 01/05/2015, and CT of the abdomen and pelvis performed 10/15/2014 FINDINGS: Gallbladder: Stones are noted dependently in the gallbladder. The gallbladder wall is borderline prominent. No pericholecystic fluid is seen. Evaluation for ultrasonographic Murphy's sign is not possible as the patient is unconscious. Common bile duct: Diameter: 0.4 cm, within normal limits in caliber. Liver: No focal lesion identified. Within normal limits in parenchymal echogenicity. Difficult to fully assess due to the midline pain pump. IVC: No abnormality visualized. Pancreas: Not visualized. Spleen: Size and appearance within normal limits. Right Kidney: Length: 9.4 cm. Not fully assessed due to overlying bowel gas. Echogenicity within normal limits. No mass or hydronephrosis visualized. Left Kidney: Not fully assessed due to overlying bowel gas. Echogenicity within normal limits. No mass or hydronephrosis visualized. Abdominal aorta: Not visualized due to overlying bowel gas. Other findings: None. IMPRESSION: 1. No acute abnormality seen within the abdomen. Evaluation is significantly suboptimal due to bowel gas. 2. Cholelithiasis. Borderline prominent gallbladder wall. Gallbladder otherwise grossly unremarkable. Electronically Signed   By: Garald Balding M.D.   On: 01/13/2015 23:07   Ir Replc Duoden/jejuno Tube Percut W/fluoro  01/01/2015  CLINICAL DATA:  Routine jejunostomy tube exchange EXAM: IR REPLACE DUODEN/JEJUNO TUBE PERCUT WITH FLOURO FLUOROSCOPY TIME:  12 seconds MEDICATIONS AND MEDICAL HISTORY: None ANESTHESIA/SEDATION: None CONTRAST:  5 cc  Omnipaque 300 PROCEDURE: The procedure, risks, benefits, and alternatives were explained to the patient. Questions regarding the procedure were encouraged and answered. The patient understands and consents to the procedure. The abdomen was prepped with Betadine in a sterile fashion, and a sterile drape was applied covering the operative field. A sterile gown and sterile gloves were used for the procedure. The existing gastrojejunostomy tube was removed over a stiff glidewire and exchange for a new make jejunostomy tube. The tip was positioned in the proximal jejunum. Contrast was injected. 10 cc saline was utilized to insufflate the balloon. FINDINGS: The jejunostomy tube is been exchange. Tip is in the proximal jejunum. COMPLICATIONS: None IMPRESSION: Successful jejunostomy tube exchange. Electronically Signed   By: Marybelle Killings M.D.   On: 01/01/2015 12:18   Dg Chest Port 1 View  01/16/2015  CLINICAL DATA:  Shortness of breath. EXAM: PORTABLE CHEST 1 VIEW COMPARISON:  January 15, 2015. FINDINGS: Hypoinflation of the lungs is noted. Stable cardiomediastinal silhouette. Status post surgical fusion of the thoracic and lumbar spine. Mild to moderate bibasilar opacities are noted concerning for subsegmental atelectasis or possibly pneumonia. No pneumothorax is noted. No significant pleural effusion is noted. IMPRESSION: Mild to moderate bibasilar opacities are noted concerning for subsegmental atelectasis or pneumonia. Hypoinflation of the lungs is again noted. Electronically Signed   By: Marijo Conception, M.D.   On: 01/16/2015 18:50   Dg Chest Port 1 View  01/15/2015  CLINICAL DATA:  Pneumonia. EXAM: PORTABLE CHEST 1 VIEW COMPARISON:  01/02/2015 FINDINGS: Chronic low lung volumes. Increased densities at the left lung base are concerning for worsening atelectasis or infection. Stable appearance of the heart and mediastinum. Port-A-Cath in the SVC region. Stable appearance of the spinal surgical hardware.  IMPRESSION: Increased densities in the left lower lobe are concerning for worsening atelectasis or infection. Chronic low lung volumes. Electronically Signed   By: Markus Daft M.D.   On: 01/15/2015 13:02   Dg Chest Portable 1 View  01/19/2015  CLINICAL DATA:  Fever. EXAM: PORTABLE CHEST 1 VIEW COMPARISON:  December 3rd 2016. FINDINGS: Stable chronic low lung volumes are noted. Stable surgical fusion of thoraco lumbar spine is noted. No pneumothorax is noted. Increased bibasilar linear densities are noted most consistent with worsening subsegmental atelectasis or possibly pneumonia. Cardiomediastinal silhouette is unchanged. IMPRESSION: Stable chronic low lung volumes are noted. Increased bibasilar linear densities are noted most consistent with worsening subsegmental atelectasis or possibly pneumonia. Electronically Signed   By: Marijo Conception, M.D.   On: 01/26/2015 20:01   Dg Chest Port 1 View  01/04/2015  CLINICAL DATA:  Shortness of breath and fever.  Recent pneumonia. EXAM: PORTABLE CHEST 1 VIEW COMPARISON:  01/01/2015 and 12/31/2014. FINDINGS: 0814 hours. There are chronic low lung volumes. The patient's mandible overlies the right lung apex. Chronic bibasilar atelectasis or scarring is unchanged. There is no airspace disease, edema or pleural effusion. Right subclavian Port-A-Cath appears unchanged. The bones appear unchanged status post thoracolumbar fusion. IMPRESSION: No significant change in chronic bibasilar atelectasis or scarring. No acute findings demonstrated. Electronically Signed   By: Richardean Sale M.D.   On: 01/04/2015 10:29   Portable Chest 1 View  01/01/2015  CLINICAL DATA:  Shortness of breath, cough, dyspnea.  Cerebral palsy EXAM: PORTABLE CHEST 1 VIEW COMPARISON:  Yesterday FINDINGS: Porta catheter on the right with tip in good position at the SVC level. Persistent hypoventilation and linear opacities at the bases. There is no edema, consolidation, effusion, or pneumothorax.  Normal heart size and mediastinal contours. Extensive spinal fixation. IMPRESSION: Chronic hypoventilation with basilar atelectasis or scarring. Electronically Signed   By: Monte Fantasia M.D.   On: 01/01/2015 07:40   Dg Chest Port 1 View  12/31/2014  CLINICAL DATA:  Chest pain recent pneumonia persistent fever for 1 week EXAM: PORTABLE CHEST 1 VIEW COMPARISON:  10/19/2014 FINDINGS: Spinal rods noted. Port-A-Cath stable on the right. Low lung volumes with bibasilar atelectasis. Heart size normal. IMPRESSION: Mild bilateral lower lobe atelectasis. No definite evidence of pneumonia. Subtle infiltrate could be due obscured by the bilateral lower lobe atelectasis however. Electronically Signed   By: Skipper Cliche M.D.   On: 12/31/2014 17:39   Dg Abd Portable 1v  01/19/2015  CLINICAL DATA:  Distended abdomen.  Sepsis. EXAM: PORTABLE ABDOMEN - 1 VIEW COMPARISON:  01/18/2015 and prior exams FINDINGS: Gaseous distention of the colon has decreased. No dilated small bowel loops are noted. Spinal surgical hardware and spinal pump again noted. Enteric tube again noted. IMPRESSION: Decreased colonic distention. Electronically Signed   By: Margarette Canada M.D.   On: 01/19/2015 14:30   Dg Abd Portable 1v  01/18/2015  CLINICAL DATA:  Gaseous abdominal distention EXAM: PORTABLE ABDOMEN - 1 VIEW COMPARISON:  01/15/2015 FINDINGS: Mild gas distention of ascending and transverse colon. Transverse colon measures up to 8.9 cm diameter. Scattered gas remainder of abdomen including sigmoid colon. No definite bowel wall thickening. Extensive prior spinal fixation anchored into pelvis. Osseous demineralization. Jejunostomy through gastrostomy tube again noted. RIGHT lower quadrant infusion pump with catheter extending to spine. IMPRESSION: Mild gas distention of the proximal half of colon known significant gas is also seen in the sigmoid colon, question ileus. Electronically Signed   By: Lavonia Dana M.D.   On: 01/18/2015 12:36    Dg Abd Portable 1v  01/15/2015  CLINICAL DATA:  Abdominal distension. EXAM: PORTABLE ABDOMEN - 1 VIEW COMPARISON:  10/15/2014 FINDINGS: Spinal hardware throughout the thoracolumbar spine extending into the pelvis. Chronic  changes involving the pelvic bones and hips. There is a GJ feeding tube and the tip is probably in the jejunal region. Again noted is an implant infusion device in the right lower abdomen. Few gas-filled loops of bowel but nonobstructive pattern. IMPRESSION: No acute abnormality. Electronically Signed   By: Markus Daft M.D.   On: 01/15/2015 13:08   US Abdomen Limited Ruq  01/05/2015  CLINICAL DATA:  Elevated liver function tests. Cerebral palsy. Feeding tube and implanted pain pump in the right upper quadrant of the abdomen overlying the gallbladder area. EXAM: US ABDOMEN LIMITED - RIGHT UPPER QUADRANT COMPARISON:  Limited abdomen ultrasound dated 11/06/2014. Abdomen CT dated 10/15/2014. FINDINGS: Gallbladder: Multiple gallstones in the gallbladder measuring up to 7 mm in maximum diameter each. No gallbladder wall thickening or pericholecystic fluid. No apparent pain over the gallbladder. Common bile duct: Diameter: 3.8 mm Liver: Normal echogenicity.  No mass or biliary ductal dilatation seen. IMPRESSION: 1. Cholelithiasis without evidence cholecystitis. 2. Otherwise, unremarkable examination. Electronically Signed   By: Claudie Revering M.D.   On: 01/05/2015 15:59    Oren Binet, MD  [facility] Pager:[phone]  If 7PM-7AM, please contact night-coverage [email or web address] Password [email or web address] 01/21/2015, 1:02 PM   LOS: 9 days

## 2015-01-21 NOTE — Consult Note (Signed)
Consultation Note Date: 01/21/2015   Patient Name: Jeffery [name]  DOB: March 11, 1990  MRN: [id number]  Age / Sex: 24 y.o., male  PCP: Marijean Bravo, MD Referring Physician: Jonetta Osgood, MD  Reason for Consultation: Non pain symptom management of secretions and diarrhea. Additionally, assistance with overall goals of care.     Clinical Assessment/Narrative:   24 year old male with history of cerebral palsy with spastic quadriplegia-baclofen pump in place, seizure disorder, history of pancytopenia requiring frequent PRBC transfusion/Neupogen injections, chronic respiratory failure requiring oxygen and BiPAP at home, severe dysphagia on J-tube feedings-recently diagnosed with sacral osteomyelitis, stage IV sacral decubitus and discharged home with IV Invanz on 12/6, readmitted to the hospital on 12/11 with severe sepsis likely secondary from aspiration pneumonia. Profoundly hypernatremic on admission. Admitted and started on broad-spectrum antibiotics, infectious disease was consulted. Unfortunately hospital course has been complicated by development of repeated episodes of aspiration causing respiratory distress with worsening hypoxia-requiring BiPAP support. Hospital course has also been complicated by persistent diarrhea causing hypernatremia. Code status established as DO NOT RESUSCITATE. GI and ID have been re consulted. Palliative consulted initially, chart review notes patient was seen by palliative in the past. In respecting the patient's mother/ legal guardian's wishes, palliative care has been following from afar in this hospitalization thus far.   Palliative care consult requested by Dr Sloan Leiter this am on 01-21-15. Patient seen. Met with the patient's father/ legal guardian Mr [name] extensively at bedside this am. Introduced addition of palliative services as an extra layer of support, to see if we may be of  some assistance with symptom management.  Hospital course discussed with Mr. [name]. He states that his main concern for the patient is his ongoing diarrhea, high sodium, lack of nutrition, wounds, need for consistent NT suctioning. Brief life review performed. The patient has had excellent care delivered to him by both Mr. [name] and the patient's legal guardian/mother who is also an Therapist, sports.  Discussed about symptom management of secretions as well as diarrhea. Discussed with respiratory therapy about being gentle as well as effective with NT suctioning. Discussed extensively with patient's father about nutrition. Currently, he requests total parenteral nutrition. Discussed about patient's overall condition, current status. Discussed with Dr. Sloan Leiter about patient's father requesting TPN  Symptom management with scheduled Robinul, symptom management with continuation of Lomotil and adding scheduled Imodium. May also recommend octreotide infusion.  Palliative services will continue to follow along and have gentle conversations with agents parents as well as try to be of assistance with symptom management. Patient clearly is with multiple comorbidities and is in a most unfortunate situation. It does appear, that that there is a high possibility that the patient may not survive this hospitalization.   Contacts/Participants in Discussion: Primary Decision Maker: Relationship to Patient patient's parents HCPOA: yes     SUMMARY OF RECOMMENDATIONS: 1. Diarrhea: continue lomotil, add low dose scheduled Imodium and monitor. Next recommendation would be for octreotide drip. GI to follow. Watch ins and outs carefully 2. Secretions from asp pna: Add scheduled Robinul for resp and intestinal secretions and monitor. Continue BiPAP and NT suction for now. Patient also on Antibiotics, ID to follow.   Brief discussion about goals of care: patient's father states that patient has recurrent aspiration PNA and problem  maintaining his secretions. He states this is not a new problem for the patient. He simply wishes to see efforts being made at improving the patient's other serious conditions such as electrolyte abnormalities, diarrhea, nutritional status  etc. Very gently discussed with him about low serum Albumin and its correlation with high mortality. He wishes for some clinical stabilization so that he can take the patient home. He states he is willing to consider Hospice in the future, as he does not believe the patient has irreversible illnesses yet.  PLAN: continue to offer support, active listening validation of his concerns.   Code Status/Advance Care Planning: DNR    Code Status Orders        Start     Ordered   01/02/2015 2234  Do not attempt resuscitation (DNR)   Continuous    Question Answer Comment  In the event of cardiac or respiratory ARREST Do not call a "code blue"   In the event of cardiac or respiratory ARREST Do not perform Intubation, CPR, defibrillation or ACLS   In the event of cardiac or respiratory ARREST Use medication by any route, position, wound care, and other measures to relive pain and suffering. May use oxygen, suction and manual treatment of airway obstruction as needed for comfort.      01/22/2015 2233    Advance Directive Documentation        Most Recent Value   Type of Advance Directive  Living will   Pre-existing out of facility DNR order (yellow form or pink MOST form)     "MOST" Form in Place?        Other Directives:Other  Symptom Management:   As above  Palliative Prophylaxis:   Bowel Regimen  Additional Recommendations (Limitations, Scope, Preferences):  continue to address issues pertaining to patient's secretions, diarrhea, electrolyte abonrmalities   Psycho-social/Spiritual:  Support System: Strong Desire for further Chaplaincy support:yes Additional Recommendations: Caregiving  Support/Resources  Prognosis: Unable to determine  Discharge  Planning: pending hospitalization course. patient at high risk for not surviving this hospitalization    Chief Complaint/ Primary Diagnoses: Present on Admission:  . Acute on chronic respiratory failure with hypoxia ([place]) . Cerebral palsy, quadriplegic ([place]) . Acquired pancytopenia ([place]) . Severe sepsis ([place]) . Transaminitis . Malnutrition ([place]) . Osteomyelitis ([place]) . Pressure ulcer . Hypernatremia . AKI (acute kidney injury) ([place])  I have reviewed the medical record, interviewed the patient and family, and examined the patient. The following aspects are pertinent.  Past Medical History  Diagnosis Date  . CP (cerebral palsy), spastic, quadriplegic ([place])   . Osteoporosis   . Undescended testes   . Seasonal allergies   . IVH (intraventricular hemorrhage) ([place]) 09/09/90    Grade IV  . Hip dislocation, bilateral ([place])   . Dysphagia   . Retinopathy of prematurity   . 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 Neuromuscular scoliosis   . Osteoporosis   . Complex partial seizures ([place])   . Generalized convulsive epilepsy without mention of intractable epilepsy   . Sinus bradycardia     HR drops to 38-40 while sleeping  . Blister of right heel     fluid filled; origin unknown  . Kidney stones     ?  Marland Kitchen Pneumonia      chronic pneumonia ,respitory failure dx Augest 2014  . Aspiration pneumonia ([place])     "chronic" (04/12/2014)  . OSA treated with BiPAP     "since age 81"   . Anemia   . 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 GERD (gastroesophageal reflux disease)   . Epilepsy ([place])   . 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  Neutropenia ([place]) 07/03/2014   Social History   Social History  . Marital Status: Single    Spouse Name: N/A  . Number of Children: 0  . Years of Education: N/A   Occupational History  . Student    .     Social History Main Topics  . Smoking status: Never Smoker   . Smokeless tobacco: Never  Used     Comment: never used tobacco  . Alcohol Use: No  . Drug Use: No  . Sexual Activity: No   Other Topics Concern  . None   Social History Narrative   Pt lives at home with his legal guardians Jenne Campus)         Family History  Problem Relation Age of Onset  . Adopted: Yes   Scheduled Meds: . acetaminophen  650 mg Oral Once  . [START ON 01/22/2015] alosetron  1 mg Oral BID  . antiseptic oral rinse  7 mL Mouth Rinse q12n4p  . ceFTAROline (TEFLARO) IV  600 mg Intravenous Q12H  . chlorhexidine  15 mL Mouth Rinse BID  . cholestyramine  4 g Per Tube TID  . cloBAZam  3.75 mg Per Tube TID AC  . [facility] Beanaid  2 capsule Per J Tube TID  . diphenhydrAMINE  12.5 mg Intravenous Once  . diphenoxylate-atropine  10 mL Per Tube QID  . feeding supplement (PRO-STAT SUGAR FREE 64)  30 mL Per Tube TID  . filgrastim  480 mcg Subcutaneous Once  . folic acid  1 mg Oral Daily  . free water  180 mL Per Tube 3 times per day  . gabapentin  900 mg Per Tube 3 times per day  . glycopyrrolate  0.4 mg Intravenous QID  . levalbuterol  0.63 mg Nebulization Q6H  . loperamide  2 mg Per Tube Q6H  . metronidazole  500 mg Intravenous Q8H  . pantoprazole sodium  40 mg Per Tube BID  . potassium chloride  40 mEq Per Tube Daily  . sodium chloride  3 mL Intravenous Q12H   Continuous Infusions: . baclofen    . dextrose 5 % 1,000 mL with potassium chloride 20 mEq infusion 125 mL/hr at 01/21/15 0500  . TWOCAL HN 237 mL (01/17/15 1000)   PRN Meds:.acetaminophen (TYLENOL) oral liquid 160 mg/5 mL **OR** acetaminophen, dextrose, dicyclomine, ibuprofen, ketoconazole, levalbuterol, zinc oxide Medications Prior to Admission:  Prior to Admission medications   Medication Sig Start Date End Date Taking? Authorizing Provider  acetaminophen (TYLENOL) 160 MG/5ML solution Place 500 mg into feeding tube every 6 (six) hours as needed for moderate pain or fever.    Yes Historical Provider, MD  acetaminophen (TYLENOL)  325 MG suppository Place 650 mg rectally every 6 (six) hours as needed for fever.   Yes Historical Provider, MD  albuterol (PROVENTIL HFA;VENTOLIN HFA) 108 (90 BASE) MCG/ACT inhaler Inhale 2 puffs into the lungs every 4 (four) hours as needed for shortness of breath.   Yes Historical Provider, MD  albuterol (PROVENTIL) (2.5 MG/3ML) 0.083% nebulizer solution Take 2.5 mg by nebulization See admin instructions. Give 1 vial (2.5 mg) twice daily (8am and 8pm) and every 4 hours as needed for shortness of breath or wheezing 10/11/12  Yes Elsie Stain, MD  baclofen (GABLOFEN) 40000 MCG/20ML SOLN by Intrathecal route continuous. 385.2 mcg in 24 hours 05/04/12  Yes Jodi Geralds, MD  bag balm OINT ointment Apply 1 application topically See admin instructions. Apply to sacral area with each diaper change  Yes Historical Provider, MD  calcium carbonate, dosed in mg elemental calcium, 1250 MG/5ML Place 1,250 mg into feeding tube 3 (three) times daily. 8am , 2pm, and 8pm   Yes Historical Provider, MD  Cholecalciferol (VITAMIN D3) 400 UNIT/ML LIQD Give 2.5 mLs by tube daily. 1076m daily 10/02/14  Yes Historical Provider, MD  DIASTAT ACUDIAL 20 MG GEL Place 12.5 mg rectally as needed (for seizure lasting 2 minutes or longer or repetitive seizures - no more than 1 dose in 12 hours (not given with nasal versed)).  04/01/14  Yes Historical Provider, MD  dicyclomine (BENTYL) 10 MG/5ML syrup Place 10 mg into feeding tube every 8 (eight) hours as needed (abdominal cramps). Do not exceed 5 days in one week   Yes Historical Provider, MD  diphenhydrAMINE (BENADRYL) 12.5 MG/5ML liquid Place 25 mg into feeding tube 4 (four) times daily as needed (30 min before Neupogen).    Yes Historical Provider, MD  diphenoxylate-atropine (LOMOTIL) 2.5-0.025 MG/5ML liquid 10 mLs by Gastric Tube route every 6 (six) hours as needed for diarrhea or loose stools.    Yes Historical Provider, MD  filgrastim (NEUPOGEN) 300 MCG/ML injection Inject  1 mL (300 mcg total) into the skin once a week. Patient taking differently: Inject 300 mcg into the skin as needed (neutropenia).  12/04/14  Yes SEliezer Bottom NP  folic acid (FOLVITE) 1 MG tablet Give 1 mg by tube every morning. 8:00am per G tube   Yes Historical Provider, MD  furosemide (LASIX) 10 MG/ML solution 20 mg by Per J Tube route daily as needed for fluid or edema.    Yes Historical Provider, MD  gabapentin (NEURONTIN) 250 MG/5ML solution Take 18 mL 3 times daily Patient taking differently: Place 900 mg into feeding tube 3 (three) times daily. Take 18 mL 3 times daily 12/09/14  Yes TRockwell Germany NP  glycopyrrolate (ROBINUL) 1 MG tablet Place 1 tablet (1 mg total) into feeding tube 3 (three) times daily as needed (excessive secretions). 01/06/15  Yes Shanker MKristeen Mans MD  Heparin Lock Flush (HEPARIN FLUSH, PORCINE,) 100 UNIT/ML injection 5 mLs (500 Units total) by Intracatheter route as needed (Prior to de-accessing port). 10/25/14  Yes PVolanda Napoleon MD  ibuprofen (ADVIL,MOTRIN) 100 MG/5ML suspension 400 mg by Gastric Tube route every 6 (six) hours as needed for fever (pain).    Yes Historical Provider, MD  ketoconazole (NIZORAL) 2 % cream Apply 1 application topically 2 (two) times daily as needed for irritation.   Yes Historical Provider, MD  lidocaine (XYLOCAINE) 5 % ointment APPLY AS DIRECTED 30 minutes PRIOR TO BLOOD DRAWS 09/23/14  Yes Historical Provider, MD  metoCLOPramide (REGLAN) 5 MG/5ML solution Place 10 mLs (10 mg total) into feeding tube 4 (four) times daily -  before meals and at bedtime. 07/26/14  Yes EKelvin Cellar MD  midazolam (VERSED) 5 MG/ML injection Place 2 mLs (10 mg total) into the nose once. Draw up 148min 2 syringes. Remove blue vial access device. Attach syringe to nasal atomizer for intranasal administration. Give 6m17mn right nostril x 2 for seizures lasting 2 minutes or longer or for repetitive seizures in a short period of time. Patient taking  differently: Place 10 mg into the nose as needed (for seizure lasting 2 minutes or longer or repetitive seizures - no more than 1 dose in 24 hours (if unable to give diastat)). Draw up 6ml17m 2 syringes. Remove blue vial access device. Attach syringe to nasal atomizer for intranasal administration. Give  58m in right nostril x 2 for seizures lasting 2 minutes or longer or for repetitive seizures in a short period of time. 11/05/13  Yes TRockwell Germany NP  Multiple Vitamin (MULTIVITAMIN) LIQD 10 mLs by Gastric Tube route 2 (two) times daily.    Yes Historical Provider, MD  Multiple Vitamins-Minerals (ZINC PO) 5 mLs by Gastric Tube route 2 (two) times daily. 244 mg / 5 mL zinc solution compounded by [facility]   Yes Historical Provider, MD  mupirocin ointment (BACTROBAN) 2 % Apply 1 application topically as needed (to G-T site). Patient taking differently: Apply 1 application topically 3 (three) times daily as needed (for redness at the G-T site).  09/07/12  Yes DLafayette Dragon MD  Nutritional Supplements (PROMOD) LIQD Give 40 mLs by tube 2 (two) times daily. 8am and 5pm   Yes Historical Provider, MD  Nutritional Supplements (TWOCAL HN) LIQD 237 mLs by Per J Tube route See admin instructions. T.3 can at 46 cc hour x 12 hours - Gatorade 172 cc pre and post each and extra 172 bid.   Yes Historical Provider, MD  omeprazole (PRILOSEC) 2 mg/mL SUSP Take 20 mg by mouth 2 (two) times daily.   Yes Historical Provider, MD  OnabotulinumtoxinA (BOTOX IJ) Inject as directed See admin instructions. Every 3 months   Yes Historical Provider, MD  ONFI 2.5 MG/ML solution Give 1.517mvia gastrostomy tube at 8 AM, 2 PM and 8 PM 11/14/14  Yes TiRockwell GermanyNP  OVER THE COUNTER MEDICATION 2 capsules by Per J Tube route 3 (three) times daily.   Yes Historical Provider, MD  OXYGEN-HELIUM IN Inhale 3 L into the lungs as needed (o2 at 90%). Oxygen PRN to keep O2 Sat at 90%   Yes Historical Provider, MD  potassium chloride  (KLOR-CON) 20 MEQ packet Give 20 meq daily except twice daily with lasix, given through J port Patient taking differently: Take 20 mEq by mouth daily. Give an additional dose when taking lasix 07/26/14  Yes EzKelvin CellarMD  Pseudoephedrine HCl (SUDAFED CHILDRENS) 15 MG/5ML LIQD 30 mg by Gastric Tube route 3 (three) times daily. 8am, 2pm, 8pm   Yes Historical Provider, MD  sodium chloride 0.9 % injection Flush port after use with 10cc NS. Disp# 10 - 10cc syringes 10/25/14  Yes PeVolanda NapoleonMD  sodium phosphate (FLEET) enema Place 1 enema rectally daily as needed (gas buildup/ bloating).    Yes Historical Provider, MD  sucralfate (CARAFATE) 1 GM/10ML suspension 1 g by Gastric Tube route 4 (four) times daily. 7am, 12pm, 5pm, 7pm Administer alone 30 minutes prior to other medications.   Yes Historical Provider, MD  Zinc Oxide (BALMEX EX) Apply 1 application topically See admin instructions. Apply to sacral with each diaper change   Yes Historical Provider, MD  Amino Acids-Protein Hydrolys (FEEDING SUPPLEMENT, PRO-STAT SUGAR FREE 64,) LIQD Take 30 mLs by mouth 2 (two) times daily. Patient not taking: Reported on 01/20/2015 06/27/14   OrVelvet BatheMD   Allergies  Allergen Reactions  . Depakote [Divalproex Sodium] Other (See Comments)    Causes pancreatitis   . Keppra [Levetiracetam] Other (See Comments)    Bone marrow suppression  . Vimpat [Lacosamide] Rash  . Adhesive [Tape] Other (See Comments)    Rips skin off (paper tape is ok)  . Neulasta [Pegfilgrastim] Other (See Comments)    Fever, tachycardia    Review of Systems Not obtainable from patient.   Physical Exam On BiPAP Chronic contractures upper  extremities Shallow clear breathing anteriorly S1 S2 Abdomen distended, faint to none bowel sounds, has PEG tube Trace edema Skin warm to touch, no coolness no mottling  Vital Signs: BP 111/73 mmHg  Pulse 113  Temp(Src) 98.9 F (37.2 C) (Rectal)  Resp 26  Ht _0  (1.499 m)   Wt 58.8 kg (129 lb 10.1 oz)  BMI 26.17 kg/m2  SpO2 95%  SpO2: SpO2: 95 % O2 Device:SpO2: 95 % O2 Flow Rate: .O2 Flow Rate (L/min): 14 L/min  IO: Intake/output summary:  Intake/Output Summary (Last 24 hours) at 01/21/15 1331 Last data filed at 01/21/15 1100  Gross per 24 hour  Intake   3645 ml  Output   4050 ml  Net   -405 ml    LBM: Last BM Date: 01/19/15 Baseline Weight: Weight: 58.8 kg (129 lb 10.1 oz) Most recent weight: Weight: 58.8 kg (129 lb 10.1 oz)      Palliative Assessment/Data:  Flowsheet Rows        Most Recent Value   Intake Tab    Referral Department  Hospitalist   Unit at Time of Referral  Cardiac/Telemetry Unit   Palliative Care Primary Diagnosis  Sepsis/Infectious Disease   Date Notified  01/26/2015   Palliative Care Type  New Palliative care   Reason for referral  Clarify Goals of Care   Date of Admission  01/05/2015   Date first seen by Palliative Care  01/21/15   # of days IP prior to Palliative referral  0   Clinical Assessment    Palliative Performance Scale Score  10%   Psychosocial & Spiritual Assessment    Palliative Care Outcomes    Patient/Family meeting held?  Yes   Who was at the meeting?  father    Palliative Care Outcomes  Improved non-pain symptom therapy   Palliative Care follow-up planned  Yes, Facility      Additional Data Reviewed:  CBC:    Component Value Date/Time   WBC 2.8* 01/21/2015 0435   WBC 5.7 11/08/2014 0827   HGB 10.4* 01/21/2015 0435   HGB 8.9* 11/08/2014 0827   HCT 35.0* 01/21/2015 0435   HCT 30.0* 11/08/2014 0827   PLT 97* 01/21/2015 0435   PLT 115* 11/08/2014 0827   MCV 103.6* 01/21/2015 0435   MCV 110* 11/08/2014 0827   NEUTROABS 2.0 01/21/2015 0435   NEUTROABS 4.7 11/08/2014 0827   LYMPHSABS 0.3* 01/21/2015 0435   LYMPHSABS 0.4* 11/08/2014 0827   MONOABS 0.4 01/21/2015 0435   EOSABS 0.1 01/21/2015 0435   EOSABS 0.1 11/08/2014 0827   BASOSABS 0.0 01/21/2015 0435   BASOSABS 0.0 11/08/2014 0827    Comprehensive Metabolic Panel:    Component Value Date/Time   NA 148* 01/21/2015 0435   NA 138 11/08/2014 0828   K 4.1 01/21/2015 0435   K 4.1 11/08/2014 0828   CL 118* 01/21/2015 0435   CL 101 11/08/2014 0828   CO2 17* 01/21/2015 0435   CO2 30 11/08/2014 0828   BUN 20 01/21/2015 0435   BUN 16 11/08/2014 0828   CREATININE 1.05 01/21/2015 0435   CREATININE 0.6 11/08/2014 0828   GLUCOSE 103* 01/21/2015 0435   GLUCOSE 97 11/08/2014 0828   CALCIUM 8.6* 01/21/2015 0435   CALCIUM 9.7 11/08/2014 0828   AST 19 01/19/2015 0310   AST 40* 11/08/2014 0828   ALT 20 01/19/2015 0310   ALT 69* 11/08/2014 0828   ALKPHOS 469* 01/19/2015 0310   ALKPHOS 147* 11/08/2014 0828   BILITOT 0.4 01/19/2015  0310   BILITOT 0.50 11/08/2014 0828   PROT 4.0* 01/19/2015 0310   PROT 6.2* 11/08/2014 0828   ALBUMIN 1.5* 01/19/2015 0310   ALBUMIN 3.1* 11/08/2014 0828     Time In: 0900 Time Out: 1030 Time Total: 90 Greater than 50%  of this time was spent counseling and coordinating care related to the above assessment and plan.  Signed by: Loistine Chance, MD [id number] Loistine Chance, MD  01/21/2015, 1:31 PM  Please contact Palliative Medicine Team phone at [phone] for questions and concerns.

## 2015-01-21 NOTE — Progress Notes (Signed)
Patient appearing in respiratory distress with respiratory rate 39-51 bpm with Sp02 level at 91%. Increased patients fio2 to 60% and increased patient IPAP for increased work of breathing.

## 2015-01-21 NOTE — Consult Note (Signed)
[facility] CONSULT Reason for consult: Profound Diarrhea Referring Physician: [facility]  Jeffery [name] is an 24 y.o. male.  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 He has had multiple related problems. He has a seizure disorder and pancytopenia felt to be due to seizure medications and is seen hematology. He was admitted about 1 week ago with severe sepsis. He had just been discharged about 2 weeks earlier with sepsis and was on antibiotics and followed by ID for chronic osteomyelitis due to pressure ulcerations. He is unable to eat and has a feeding tube that his father describes as a jejunostomy tube. He has chronic reflux. According to father he had no diarrhea at the time he was discharged but had semisolid stools. The diarrhea started after being on antibiotics at home, managed by Dr. Johnnye [name]. The father notes that while at home he began to have worsening stools and since being readmitted to the hospital he has continued on antibiotics and has been treated for osteomyelitis aspiration pneumonia etc. He has had profound diarrhea refractory to medications. He has had 3 different stools negative for C. difficile toxin. KUB did show a colonic ileus a few days ago improved on subsequent films. He does have rectal tube in place that has been draining liquid stool. His diarrhea has been refractory to Lomotil, cholestyramine, Robinul, Imodium, IV Flagyl. He is continuing to have watery stools in his enteral feeding is been on hold. His father is asking if he needs to be placed on TNA. He is a DNR but his family does appear to want to be aggressive in his care..  Past Medical History  Diagnosis Date  . CP (cerebral palsy), spastic, quadriplegic ([place])   . Osteoporosis   . Undescended testes   . Seasonal allergies   . IVH (intraventricular hemorrhage) ([place]) 1990-02-11    Grade IV  . Hip dislocation, bilateral ([place])   . Dysphagia   . Retinopathy of prematurity   .  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 Neuromuscular scoliosis   . Osteoporosis   . Complex partial seizures ([place])   . Generalized convulsive epilepsy without mention of intractable epilepsy   . Sinus bradycardia     HR drops to 38-40 while sleeping  . Blister of right heel     fluid filled; origin unknown  . Kidney stones     ?  Marland Kitchen Pneumonia      chronic pneumonia ,respitory failure dx Augest 2014  . Aspiration pneumonia ([place])     "chronic" (04/12/2014)  . OSA treated with BiPAP     "since age 55"   . Anemia   . 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 GERD (gastroesophageal reflux disease)   . Epilepsy ([place])   . 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)   . Neutropenia ([place]) 07/03/2014    Past Surgical History  Procedure Laterality Date  . Mole removal  "several B/T 2008-2010"    "from all over"  . Jejunostomy feeding tube  03/08/2013; ~ 09/2013; ~ 01/2014    "transgastric-jujunal feeding tube"  . Gastrostomy tube placement  11./02/1998       . Button change  12/15/2010    Procedure: BUTTON CHANGE;  Surgeon: Gatha Mayer, MD;  Location: [facility];  Service: Endoscopy;  Laterality: N/A;  . Peg placement  10/07/2011    Procedure: PERCUTANEOUS ENDOSCOPIC GASTROSTOMY (PEG) REPLACEMENT;  Surgeon: Lafayette Dragon,  MD;  Location: [facility];  Service: Endoscopy;  Laterality: N/A;  Needs 18 F 2.5 button ordered-dl  . Inguinal hernia repair Bilateral 1992  . Retinopathy of prematurity surgery  1992  . Achilles tendon lengthening Bilateral 12/1998  . Tendon release  12/1998    Soft tissue releases  wrists and fingers [Other]  . Infusion pump implantation  07/25/2000; 2013    Intrathecal baclofen pump  . Peg placement N/A 06/05/2012    Procedure: PERCUTANEOUS ENDOSCOPIC GASTROSTOMY (PEG) REPLACEMENT;  Surgeon: Lafayette Dragon, MD;  Location: [facility];  Service: Endoscopy;  Laterality: N/A;  button 25f.2.5cm  . Strabismus surgery  Bilateral 1993    "3 on right; 2 on left"  . Back surgery  ~ 2008    Harrington Rods in back needs to be log rolled  . Tendon repair Bilateral 09/05/2012    Procedure: LENGTHENING OF DIGITAL FLEXOR TENDONS BILTERAL HANDS;  Surgeon: DJolyn Nap MD;  [facility]  Service: Orthopedics;  Laterality: Bilateral;  . Peg placement N/A 09/13/2012    Procedure: PERCUTANEOUS ENDOSCOPIC GASTROSTOMY (PEG) REPLACEMENT;  Surgeon: DLafayette Dragon MD;  Location: [facility];  Service: Endoscopy;  Laterality: N/A;  . Flexible sigmoidoscopy N/A 10/30/2012    Procedure: FLEXIBLE SIGMOIDOSCOPY;  Surgeon: RCleotis Nipper MD;  Location: [facility];  Service: Endoscopy;  Laterality: N/A;  . Peg placement N/A 11/15/2012    Procedure: PERCUTANEOUS ENDOSCOPIC GASTROSTOMY (PEG) REPLACEMENT;  Surgeon: MJeryl Columbia MD;  [facility];  Service: Endoscopy;  Laterality: N/A;  . Gastrostomy tube placement  11./02/1998  . Rectal biopsy  10/29/2012    S/P diarrhea from Vancomycin  . Portacath placement Right 11/25/2012    chest  . Eye surgery      Family History  Problem Relation Age of Onset  . Adopted: Yes    Social History:  reports that he has never smoked. He has never used smokeless tobacco. He reports that he does not drink alcohol or use illicit drugs.  Allergies:  Allergies  Allergen Reactions  . Depakote [Divalproex Sodium] Other (See Comments)    Causes pancreatitis   . Keppra [Levetiracetam] Other (See Comments)    Bone marrow suppression  . Vimpat [Lacosamide] Rash  . Adhesive [Tape] Other (See Comments)    Rips skin off (paper tape is ok)  . Neulasta [Pegfilgrastim] Other (See Comments)    Fever, tachycardia    Medications; Prior to Admission medications   Medication Sig Start Date End Date Taking? Authorizing Provider  acetaminophen (TYLENOL) 160 MG/5ML solution Place 500 mg into feeding tube every 6 (six) hours as needed for moderate pain or fever.    Yes Historical  Provider, MD  acetaminophen (TYLENOL) 325 MG suppository Place 650 mg rectally every 6 (six) hours as needed for fever.   Yes Historical Provider, MD  albuterol (PROVENTIL HFA;VENTOLIN HFA) 108 (90 BASE) MCG/ACT inhaler Inhale 2 puffs into the lungs every 4 (four) hours as needed for shortness of breath.   Yes Historical Provider, MD  albuterol (PROVENTIL) (2.5 MG/3ML) 0.083% nebulizer solution Take 2.5 mg by nebulization See admin instructions. Give 1 vial (2.5 mg) twice daily (8am and 8pm) and every 4 hours as needed for shortness of breath or wheezing 10/11/12  Yes PElsie Stain MD  baclofen (GABLOFEN) 40000 MCG/20ML SOLN by Intrathecal route continuous. 385.2 mcg in 24 hours 05/04/12  Yes WJodi Geralds MD  bag balm OINT ointment Apply 1 application topically See admin instructions. Apply to sacral  area with each diaper change   Yes Historical Provider, MD  calcium carbonate, dosed in mg elemental calcium, 1250 MG/5ML Place 1,250 mg into feeding tube 3 (three) times daily. 8am , 2pm, and 8pm   Yes Historical Provider, MD  Cholecalciferol (VITAMIN D3) 400 UNIT/ML LIQD Give 2.5 mLs by tube daily. 1049m daily 10/02/14  Yes Historical Provider, MD  DIASTAT ACUDIAL 20 MG GEL Place 12.5 mg rectally as needed (for seizure lasting 2 minutes or longer or repetitive seizures - no more than 1 dose in 12 hours (not given with nasal versed)).  04/01/14  Yes Historical Provider, MD  dicyclomine (BENTYL) 10 MG/5ML syrup Place 10 mg into feeding tube every 8 (eight) hours as needed (abdominal cramps). Do not exceed 5 days in one week   Yes Historical Provider, MD  diphenhydrAMINE (BENADRYL) 12.5 MG/5ML liquid Place 25 mg into feeding tube 4 (four) times daily as needed (30 min before Neupogen).    Yes Historical Provider, MD  diphenoxylate-atropine (LOMOTIL) 2.5-0.025 MG/5ML liquid 10 mLs by Gastric Tube route every 6 (six) hours as needed for diarrhea or loose stools.    Yes Historical Provider, MD  filgrastim  (NEUPOGEN) 300 MCG/ML injection Inject 1 mL (300 mcg total) into the skin once a week. Patient taking differently: Inject 300 mcg into the skin as needed (neutropenia).  12/04/14  Yes SEliezer Bottom NP  folic acid (FOLVITE) 1 MG tablet Give 1 mg by tube every morning. 8:00am per G tube   Yes Historical Provider, MD  furosemide (LASIX) 10 MG/ML solution 20 mg by Per J Tube route daily as needed for fluid or edema.    Yes Historical Provider, MD  gabapentin (NEURONTIN) 250 MG/5ML solution Take 18 mL 3 times daily Patient taking differently: Place 900 mg into feeding tube 3 (three) times daily. Take 18 mL 3 times daily 12/09/14  Yes TRockwell Germany NP  glycopyrrolate (ROBINUL) 1 MG tablet Place 1 tablet (1 mg total) into feeding tube 3 (three) times daily as needed (excessive secretions). 01/06/15  Yes Shanker MKristeen Mans MD  Heparin Lock Flush (HEPARIN FLUSH, PORCINE,) 100 UNIT/ML injection 5 mLs (500 Units total) by Intracatheter route as needed (Prior to de-accessing port). 10/25/14  Yes PVolanda Napoleon MD  ibuprofen (ADVIL,MOTRIN) 100 MG/5ML suspension 400 mg by Gastric Tube route every 6 (six) hours as needed for fever (pain).    Yes Historical Provider, MD  ketoconazole (NIZORAL) 2 % cream Apply 1 application topically 2 (two) times daily as needed for irritation.   Yes Historical Provider, MD  lidocaine (XYLOCAINE) 5 % ointment APPLY AS DIRECTED 30 minutes PRIOR TO BLOOD DRAWS 09/23/14  Yes Historical Provider, MD  metoCLOPramide (REGLAN) 5 MG/5ML solution Place 10 mLs (10 mg total) into feeding tube 4 (four) times daily -  before meals and at bedtime. 07/26/14  Yes EKelvin Cellar MD  midazolam (VERSED) 5 MG/ML injection Place 2 mLs (10 mg total) into the nose once. Draw up 1110min 2 syringes. Remove blue vial access device. Attach syringe to nasal atomizer for intranasal administration. Give 75m22mn right nostril x 2 for seizures lasting 2 minutes or longer or for repetitive seizures in a short  period of time. Patient taking differently: Place 10 mg into the nose as needed (for seizure lasting 2 minutes or longer or repetitive seizures - no more than 1 dose in 24 hours (if unable to give diastat)). Draw up 75ml8m 2 syringes. Remove blue vial access device. Attach syringe  to nasal atomizer for intranasal administration. Give 58m in right nostril x 2 for seizures lasting 2 minutes or longer or for repetitive seizures in a short period of time. 11/05/13  Yes TRockwell Germany NP  Multiple Vitamin (MULTIVITAMIN) LIQD 10 mLs by Gastric Tube route 2 (two) times daily.    Yes Historical Provider, MD  Multiple Vitamins-Minerals (ZINC PO) 5 mLs by Gastric Tube route 2 (two) times daily. 244 mg / 5 mL zinc solution compounded by [facility]   Yes Historical Provider, MD  mupirocin ointment (BACTROBAN) 2 % Apply 1 application topically as needed (to G-T site). Patient taking differently: Apply 1 application topically 3 (three) times daily as needed (for redness at the G-T site).  09/07/12  Yes DLafayette Dragon MD  Nutritional Supplements (PROMOD) LIQD Give 40 mLs by tube 2 (two) times daily. 8am and 5pm   Yes Historical Provider, MD  Nutritional Supplements (TWOCAL HN) LIQD 237 mLs by Per J Tube route See admin instructions. T.3 can at 46 cc hour x 12 hours - Gatorade 172 cc pre and post each and extra 172 bid.   Yes Historical Provider, MD  omeprazole (PRILOSEC) 2 mg/mL SUSP Take 20 mg by mouth 2 (two) times daily.   Yes Historical Provider, MD  OnabotulinumtoxinA (BOTOX IJ) Inject as directed See admin instructions. Every 3 months   Yes Historical Provider, MD  ONFI 2.5 MG/ML solution Give 1.515mvia gastrostomy tube at 8 AM, 2 PM and 8 PM 11/14/14  Yes TiRockwell GermanyNP  OVER THE COUNTER MEDICATION 2 capsules by Per J Tube route 3 (three) times daily.   Yes Historical Provider, MD  OXYGEN-HELIUM IN Inhale 3 L into the lungs as needed (o2 at 90%). Oxygen PRN to keep O2 Sat at 90%   Yes Historical  Provider, MD  potassium chloride (KLOR-CON) 20 MEQ packet Give 20 meq daily except twice daily with lasix, given through J port Patient taking differently: Take 20 mEq by mouth daily. Give an additional dose when taking lasix 07/26/14  Yes EzKelvin CellarMD  Pseudoephedrine HCl (SUDAFED CHILDRENS) 15 MG/5ML LIQD 30 mg by Gastric Tube route 3 (three) times daily. 8am, 2pm, 8pm   Yes Historical Provider, MD  sodium chloride 0.9 % injection Flush port after use with 10cc NS. Disp# 10 - 10cc syringes 10/25/14  Yes PeVolanda NapoleonMD  sodium phosphate (FLEET) enema Place 1 enema rectally daily as needed (gas buildup/ bloating).    Yes Historical Provider, MD  sucralfate (CARAFATE) 1 GM/10ML suspension 1 g by Gastric Tube route 4 (four) times daily. 7am, 12pm, 5pm, 7pm Administer alone 30 minutes prior to other medications.   Yes Historical Provider, MD  Zinc Oxide (BALMEX EX) Apply 1 application topically See admin instructions. Apply to sacral with each diaper change   Yes Historical Provider, MD  Amino Acids-Protein Hydrolys (FEEDING SUPPLEMENT, PRO-STAT SUGAR FREE 64,) LIQD Take 30 mLs by mouth 2 (two) times daily. Patient not taking: Reported on 01/22/2015 06/27/14   OrVelvet BatheMD   . acetaminophen  650 mg Oral Once  . [START ON 01/22/2015] alosetron  1 mg Oral BID  . antiseptic oral rinse  7 mL Mouth Rinse q12n4p  . ceFTAROline (TEFLARO) IV  600 mg Intravenous Q12H  . chlorhexidine  15 mL Mouth Rinse BID  . cholestyramine  4 g Per Tube TID  . cloBAZam  3.75 mg Per Tube TID AC  . [facility] Beanaid  2 capsule Per J  Tube TID  . diphenhydrAMINE  12.5 mg Intravenous Once  . diphenoxylate-atropine  10 mL Per Tube QID  . feeding supplement (PRO-STAT SUGAR FREE 64)  30 mL Per Tube TID  . filgrastim  480 mcg Subcutaneous Once  . folic acid  1 mg Oral Daily  . free water  180 mL Per Tube 3 times per day  . gabapentin  900 mg Per Tube 3 times per day  . glycopyrrolate  0.4 mg Intravenous QID  .  levalbuterol  0.63 mg Nebulization Q6H  . loperamide  2 mg Per Tube Q6H  . metronidazole  500 mg Intravenous Q8H  . pantoprazole sodium  40 mg Per Tube BID  . potassium chloride  40 mEq Per Tube Daily  . sodium chloride  3 mL Intravenous Q12H   PRN Meds acetaminophen (TYLENOL) oral liquid 160 mg/5 mL **OR** acetaminophen, dextrose, dicyclomine, ibuprofen, ketoconazole, levalbuterol, zinc oxide Results for orders placed or performed during the hospital encounter of 01/03/2015 (from the past 48 hour(s))  Glucose, capillary     Status: None   Collection Time: 01/19/15  3:54 PM  Result Value Ref Range   Glucose-Capillary 85 65 - 99 mg/dL   Comment 1 Notify RN    Comment 2 Document in Chart   Glucose, capillary     Status: None   Collection Time: 01/19/15 11:53 PM  Result Value Ref Range   Glucose-Capillary 73 65 - 99 mg/dL  CBC with Differential/Platelet     Status: Abnormal   Collection Time: 01/20/15  4:20 AM  Result Value Ref Range   WBC 3.6 (L) 4.0 - 10.5 K/uL   RBC 3.05 (L) 4.22 - 5.81 MIL/uL   Hemoglobin 9.2 (L) 13.0 - 17.0 g/dL   HCT 31.1 (L) 39.0 - 52.0 %   MCV 102.0 (Jeffery [name]) 78.0 - 100.0 fL   MCH 30.2 26.0 - 34.0 pg   MCHC 29.6 (L) 30.0 - 36.0 g/dL   RDW 21.0 (Jeffery [name]) 11.5 - 15.5 %   Platelets 85 (L) 150 - 400 K/uL    Comment: REPEATED TO VERIFY CONSISTENT WITH PREVIOUS RESULT    Neutrophils Relative % 76 %   Lymphocytes Relative 7 %   Monocytes Relative 15 %   Eosinophils Relative 2 %   Basophils Relative 0 %   Neutro Abs 2.7 1.7 - 7.7 K/uL   Lymphs Abs 0.3 (L) 0.7 - 4.0 K/uL   Monocytes Absolute 0.5 0.1 - 1.0 K/uL   Eosinophils Absolute 0.1 0.0 - 0.7 K/uL   Basophils Absolute 0.0 0.0 - 0.1 K/uL   RBC Morphology POLYCHROMASIA PRESENT     Comment: TEARDROP CELLS   WBC Morphology MILD LEFT SHIFT (1-5% METAS, OCC MYELO, OCC BANDS)   Basic metabolic panel     Status: Abnormal   Collection Time: 01/20/15  4:20 AM  Result Value Ref Range   Sodium 148 (Jeffery [name]) 135 - 145 mmol/L    Potassium 4.7 3.5 - 5.1 mmol/L    Comment: DELTA CHECK NOTED   Chloride 118 (Jeffery [name]) 101 - 111 mmol/L   CO2 18 (L) 22 - 32 mmol/L   Glucose, Bld 179 (Jeffery [name]) 65 - 99 mg/dL   BUN 20 6 - 20 mg/dL   Creatinine, Ser 1.18 0.61 - 1.24 mg/dL   Calcium 8.3 (L) 8.9 - 10.3 mg/dL   GFR calc non Af Amer >60 >60 mL/min   GFR calc Af Amer >60 >60 mL/min    Comment: (NOTE) The eGFR has been calculated using  the CKD EPI equation. This calculation has not been validated in all clinical situations. eGFR's persistently <60 mL/min signify possible Chronic Kidney Disease.    Anion gap 12 5 - 15  Magnesium     Status: Abnormal   Collection Time: 01/20/15  4:20 AM  Result Value Ref Range   Magnesium 1.3 (L) 1.7 - 2.4 mg/dL  Glucose, capillary     Status: Abnormal   Collection Time: 01/20/15  4:25 AM  Result Value Ref Range   Glucose-Capillary 163 (Jeffery [name]) 65 - 99 mg/dL  Glucose, capillary     Status: None   Collection Time: 01/20/15  8:41 AM  Result Value Ref Range   Glucose-Capillary 68 65 - 99 mg/dL   Comment 1 Notify RN    Comment 2 Document in Chart   Glucose, capillary     Status: None   Collection Time: 01/20/15 11:25 AM  Result Value Ref Range   Glucose-Capillary 78 65 - 99 mg/dL   Comment 1 Notify RN    Comment 2 Document in Chart   Glucose, capillary     Status: None   Collection Time: 01/20/15  4:12 PM  Result Value Ref Range   Glucose-Capillary 80 65 - 99 mg/dL   Comment 1 Notify RN    Comment 2 Document in Chart   Glucose, capillary     Status: None   Collection Time: 01/20/15  8:16 PM  Result Value Ref Range   Glucose-Capillary 95 65 - 99 mg/dL  Glucose, capillary     Status: None   Collection Time: 01/20/15 11:29 PM  Result Value Ref Range   Glucose-Capillary 98 65 - 99 mg/dL  CBC with Differential/Platelet     Status: Abnormal   Collection Time: 01/21/15  4:35 AM  Result Value Ref Range   WBC 2.8 (L) 4.0 - 10.5 K/uL   RBC 3.38 (L) 4.22 - 5.81 MIL/uL   Hemoglobin 10.4 (L) 13.0 - 17.0  g/dL   HCT 35.0 (L) 39.0 - 52.0 %   MCV 103.6 (Jeffery [name]) 78.0 - 100.0 fL   MCH 30.8 26.0 - 34.0 pg   MCHC 29.7 (L) 30.0 - 36.0 g/dL   RDW 21.1 (Jeffery [name]) 11.5 - 15.5 %   Platelets 97 (L) 150 - 400 K/uL    Comment: CONSISTENT WITH PREVIOUS RESULT   Neutrophils Relative % 71 %   Lymphocytes Relative 11 %   Monocytes Relative 15 %   Eosinophils Relative 3 %   Basophils Relative 0 %   Neutro Abs 2.0 1.7 - 7.7 K/uL   Lymphs Abs 0.3 (L) 0.7 - 4.0 K/uL   Monocytes Absolute 0.4 0.1 - 1.0 K/uL   Eosinophils Absolute 0.1 0.0 - 0.7 K/uL   Basophils Absolute 0.0 0.0 - 0.1 K/uL   RBC Morphology POLYCHROMASIA PRESENT    Smear Review LARGE PLATELETS PRESENT   Prealbumin     Status: Abnormal   Collection Time: 01/21/15  4:35 AM  Result Value Ref Range   Prealbumin 14.8 (L) 18 - 38 mg/dL  Basic metabolic panel     Status: Abnormal   Collection Time: 01/21/15  4:35 AM  Result Value Ref Range   Sodium 148 (Jeffery [name]) 135 - 145 mmol/L   Potassium 4.1 3.5 - 5.1 mmol/L   Chloride 118 (Jeffery [name]) 101 - 111 mmol/L   CO2 17 (L) 22 - 32 mmol/L   Glucose, Bld 103 (Jeffery [name]) 65 - 99 mg/dL   BUN 20 6 - 20 mg/dL   Creatinine, Ser  1.05 0.61 - 1.24 mg/dL   Calcium 8.6 (L) 8.9 - 10.3 mg/dL   GFR calc non Af Amer >60 >60 mL/min   GFR calc Af Amer >60 >60 mL/min    Comment: (NOTE) The eGFR has been calculated using the CKD EPI equation. This calculation has not been validated in all clinical situations. eGFR's persistently <60 mL/min signify possible Chronic Kidney Disease.    Anion gap 13 5 - 15  Glucose, capillary     Status: Abnormal   Collection Time: 01/21/15  4:38 AM  Result Value Ref Range   Glucose-Capillary 109 (Jeffery [name]) 65 - 99 mg/dL  Glucose, capillary     Status: None   Collection Time: 01/21/15  8:36 AM  Result Value Ref Range   Glucose-Capillary 94 65 - 99 mg/dL   Comment 1 Notify RN    Comment 2 Document in Chart   Glucose, capillary     Status: Abnormal   Collection Time: 01/21/15 11:55 AM  Result Value Ref Range    Glucose-Capillary 104 (Jeffery [name]) 65 - 99 mg/dL   Comment 1 Notify RN    Comment 2 Document in Chart     No results found. ROS: unobtainable            Blood pressure 111/73, pulse 113, temperature 98.9 F (37.2 C), temperature source Rectal, resp. rate 26, height 4' 11"  (1.499 m), weight 58.8 kg (129 lb 10.1 oz), SpO2 95 %.  Physical exam:   General--markedly deformed nonresponsive young white male  ENT--nonicteric  - Heart--regular rate and rhythm without murmurs gallops  Lungs--clear  Abdomen--feeding tube in place somewhat hyperactive bowel sounds     Assessment: 1. Acute diarrhea. This appears to be refractory to multiple medications. His C. difficile toxin is been negative but it is possible to have C. difficile with negative toxin. He has been on Flagyl which has not really helped. My suspicion is that this is all due to constant profound antibiotic usage. 2. Cerebral palsy with multiple medical problems as noted above  Plan: who will go ahead and obtain a stool for enteric pathogens. I have discussed with pharmacy and we will obtain Lotronex, and antidiarrheal agent and exit different receptors and may be useful. Will add liquid florastor via the feeding tube. We may need to consider a unprepped sigmoidoscopy to look at his mucosal lining obtaining biopsies. It is not clear at this point whether he could stand that procedure.   Kashay Cavenaugh JR,Jasdeep Kepner L 01/21/2015, 1:09 PM   Pager: [phone] If no answer or after hours call [phone]

## 2015-01-21 NOTE — Progress Notes (Signed)
   01/21/15 1000  Clinical Encounter Type  Visited With Family;Patient and family together  Visit Type Initial;Psychological support;Spiritual support;Social support  Spiritual Encounters  Spiritual Needs Emotional;Grief support  Stress Factors  Patient Stress Factors Health changes;Lack of caregivers  Family Stress Factors Loss;Health changes  [place] visited with pt father and discussed ongoing situation of pt; Father seems pleased with meeting [place] [facility] ([facility]) as offering possibilities; Family still unrealistic in prognosis and would consider move to ICU unit after incident this past weekend. Gwynn Burly 10:18 AM

## 2015-01-21 NOTE — Progress Notes (Signed)
Chest vest held at this time due to respiratory issues. Discussed the hold with patients father and he agreed to hold vest therapy for now.

## 2015-01-21 NOTE — Progress Notes (Signed)
Pt's father requesting to hold off on CPT at this time. Will administer later. RT will continue to monitor.

## 2015-01-22 DIAGNOSIS — J9621 Acute and chronic respiratory failure with hypoxia: Secondary | ICD-10-CM

## 2015-01-22 DIAGNOSIS — Z515 Encounter for palliative care: Secondary | ICD-10-CM

## 2015-01-22 DIAGNOSIS — Z7189 Other specified counseling: Secondary | ICD-10-CM | POA: Insufficient documentation

## 2015-01-22 DIAGNOSIS — R0682 Tachypnea, not elsewhere classified: Secondary | ICD-10-CM

## 2015-01-22 DIAGNOSIS — J189 Pneumonia, unspecified organism: Secondary | ICD-10-CM

## 2015-01-22 LAB — GASTROINTESTINAL PANEL BY PCR, STOOL (REPLACES STOOL CULTURE)

## 2015-01-22 LAB — GLUCOSE, CAPILLARY
GLUCOSE-CAPILLARY: 102 mg/dL — AB (ref 65–99)
GLUCOSE-CAPILLARY: 100 mg/dL — AB (ref 65–99)
Glucose-Capillary: 97 mg/dL (ref 65–99)
Glucose-Capillary: 97 mg/dL (ref 65–99)
Glucose-Capillary: 100 mg/dL — ABNORMAL HIGH (ref 65–99)

## 2015-01-22 LAB — CBC WITH DIFFERENTIAL/PLATELET
Basophils Absolute: 0 10*3/uL (ref 0.0–0.1)
Basophils Relative: 0 %
EOS PCT: 1 %
Eosinophils Absolute: 0.1 10*3/uL (ref 0.0–0.7)
HEMATOCRIT: 35.7 % — AB (ref 39.0–52.0)
HEMOGLOBIN: 10.3 g/dL — AB (ref 13.0–17.0)
LYMPHS PCT: 3 %
Lymphs Abs: 0.3 10*3/uL — ABNORMAL LOW (ref 0.7–4.0)
MCH: 30 pg (ref 26.0–34.0)
MCHC: 28.9 g/dL — ABNORMAL LOW (ref 30.0–36.0)
MCV: 104.1 fL — ABNORMAL HIGH (ref 78.0–100.0)
MONOS PCT: 11 %
Monocytes Absolute: 1 10*3/uL (ref 0.1–1.0)
NEUTROS PCT: 85 %
Neutro Abs: 8 10*3/uL — ABNORMAL HIGH (ref 1.7–7.7)
Platelets: 87 10*3/uL — ABNORMAL LOW (ref 150–400)
RBC: 3.43 MIL/uL — AB (ref 4.22–5.81)
RDW: 20.3 % — ABNORMAL HIGH (ref 11.5–15.5)
WBC: 9.4 10*3/uL (ref 4.0–10.5)

## 2015-01-22 LAB — COMPREHENSIVE METABOLIC PANEL
ALBUMIN: 1.7 g/dL — AB (ref 3.5–5.0)
ALK PHOS: 437 U/L — AB (ref 38–126)
ALT: 12 U/L — ABNORMAL LOW (ref 17–63)
AST: 14 U/L — AB (ref 15–41)
Anion gap: 17 — ABNORMAL HIGH (ref 5–15)
BILIRUBIN TOTAL: 0.2 mg/dL — AB (ref 0.3–1.2)
BUN: 15 mg/dL (ref 6–20)
CALCIUM: 9 mg/dL (ref 8.9–10.3)
CO2: 16 mmol/L — AB (ref 22–32)
Chloride: 110 mmol/L (ref 101–111)
Creatinine, Ser: 1.13 mg/dL (ref 0.61–1.24)
GFR calc Af Amer: >60 mL/min (ref >60)
GFR calc non Af Amer: >60 mL/min (ref >60)
GLUCOSE: 87 mg/dL (ref 65–99)
POTASSIUM: 4.1 mmol/L (ref 3.5–5.1)
SODIUM: 143 mmol/L (ref 135–145)
TOTAL PROTEIN: 4.9 g/dL — AB (ref 6.5–8.1)

## 2015-01-22 LAB — FECAL LACTOFERRIN, QUANT: Fecal Lactoferrin: POSITIVE

## 2015-01-22 MED ORDER — MAGNESIUM SULFATE 2 GM/50ML IV SOLN
2.0000 g | Freq: Once | INTRAVENOUS | Status: AC
  Administered 2015-01-22: 2 g via INTRAVENOUS
  Filled 2015-01-22: qty 50

## 2015-01-22 MED ORDER — FUROSEMIDE 10 MG/ML IJ SOLN
20.0000 mg | Freq: Once | INTRAMUSCULAR | Status: AC
  Administered 2015-01-22: 20 mg via INTRAVENOUS
  Filled 2015-01-22: qty 2

## 2015-01-22 MED ORDER — LORAZEPAM 2 MG/ML IJ SOLN
1.0000 mg | INTRAMUSCULAR | Status: DC | PRN
  Administered 2015-01-22: 1 mg via INTRAVENOUS

## 2015-01-22 MED ORDER — MORPHINE SULFATE (PF) 2 MG/ML IV SOLN
1.0000 mg | INTRAVENOUS | Status: DC | PRN
  Administered 2015-01-22 – 2015-01-23 (×7): 2 mg via INTRAVENOUS
  Filled 2015-01-22 (×6): qty 1

## 2015-01-22 MED ORDER — LORAZEPAM 2 MG/ML IJ SOLN
INTRAMUSCULAR | Status: AC
  Filled 2015-01-22: qty 1

## 2015-01-22 MED ORDER — MORPHINE SULFATE (PF) 2 MG/ML IV SOLN
INTRAVENOUS | Status: AC
  Filled 2015-01-22: qty 1

## 2015-01-22 NOTE — Progress Notes (Signed)
[facility] PROGRESS NOTE Subjective stool output appears to be improved. Was down to around 400 mL yesterday. Was 1500 mL the day before. Lactoferrin level was elevated indicating inflammatory process.  Objective: Vital signs in last 24 hours: Temp:  [98.3 F (36.8 C)-99.2 F (37.3 C)] 98.5 F (36.9 C) (12/21 0834) Pulse Rate:  [99-118] 108 (12/21 0820) Resp:  [23-44] 38 (12/21 0820) BP: (87-150)/(51-75) 103/75 mmHg (12/21 0820) SpO2:  [94 %-100 %] 97 % (12/21 0851) FiO2 (%):  [50 %-100 %] 55 % (12/21 0851) Last BM Date: 01/21/15  Intake/Output from previous day: 12/20 0701 - 12/21 0700 In: 3485 [I.V.:2875; NG/GT:380; IV Piggyback:200] Out: 2645 [Urine:2250; Stool:395] Intake/Output this shift:    PE: General-- nonresponsive  Lungs-- clear Abdomen-- slight distention, nontender bowel sounds present  Lab Results:  Recent Labs  01/20/15 0420 01/21/15 0435 01/22/15 0500  WBC 3.6* 2.8* 9.4  HGB 9.2* 10.4* 10.3*  HCT 31.1* 35.0* 35.7*  PLT 85* 97* 87*   BMET  Recent Labs  01/20/15 0420 01/21/15 0435 01/22/15 0500  NA 148* 148* 143  K 4.7 4.1 4.1  CL 118* 118* 110  CO2 18* 17* 16*  CREATININE 1.18 1.05 1.13   LFT  Recent Labs  01/22/15 0500  PROT 4.9*  AST 14*  ALT 12*  ALKPHOS 437*  BILITOT 0.2*   PT/INR No results for input(s): LABPROT, INR in the last 72 hours. PANCREAS No results for input(s): LIPASE in the last 72 hours.       Studies/Results: Dg Chest Port 1 View  01/21/2015  CLINICAL DATA:  Leukopenia. EXAM: PORTABLE CHEST 1 VIEW COMPARISON:  01/16/2015 FINDINGS: Right-sided Port-A-Cath terminates over the lower SVC, unchanged. Cardiomediastinal silhouette is unchanged. The lungs remain hypoinflated. There is mild right basilar opacity which has improved. Left basilar greater than mid lung opacities have worsened. No large pleural effusion or definite pneumothorax is identified. Spinal fusion hardware is noted. IMPRESSION:  Persistent hypoinflation with improved aeration of the right lung base. Worsening left lung aeration concerning for pneumonia. Electronically Signed   By: Logan Bores M.D.   On: 01/21/2015 15:02    Medications: I have reviewed the patient's current medications.  Assessment/Plan: 1. Diarrhea. Very unfortunate situation. It does appear like the volume of stool is decreased and apparently they are going to resume his tube feedings. We had changed him from  Glycopyrolate to donnatal which has phenobarbital addition to anti-cholinergic medications Saccharomyces boulardii and will be starting later today on Lotronex. Have discussed all this with the father. If his diarrhea worsens again after tube feeding restored could consider sigmoidoscopy   Tej Murdaugh JR,Bettye Sitton L 01/22/2015, 9:21 AM  Pager: [phone] If no answer or after hours call [phone]

## 2015-01-22 NOTE — Progress Notes (Signed)
RT Note: CPT on hold per MD & father's request. RT will continue to monitor.

## 2015-01-22 NOTE — Progress Notes (Signed)
The situation certainly is no better. He is having tachypnea. He is on BiPAP. His father and mother still are pursuing aggressive means for trying to improve him.  His sodium is 143. This is gotten better.  His white cell count is 9.4. He got a dose of Neupogen yesterday. His hemoglobin is 10.3. He does not need to be transfused.  He is on a new antibiotic now. He is onTygacil.  He did have a chest x-ray yesterday. There is question of left-sided pneumonia.  He's still having some diarrhea. He is on both Lomotil and Imodium. GI saw him and put him on Lotronex.   Overall, this is just a situation that really has a tough time trying to improve. He is just so malnourished. His prealbumin actually is better than I thought it would be. It was 14.8. This might be considered a decent indicator that he might be able to improve with nutrition. Ideally, it would be optimal to feed him enterally but all this diarrhea that he has would make that very difficult. I think if he gets fed with parenteral nutrition, he will need a new central line.  On his physical exam, his temperature is 98.7. Pulse 112. Blood pressure 103/51. His lungs have some crackles and rhonchi bilaterally. Cardiac exam is tachycardic but regular. Abdomen is not distended. Bowel sounds are decreased but present. There is no obvious guarding or rebound tenderness. Extremities shows some trace edema in his legs.  From a hematologic point of view, everything is relatively stable right now. He does not need a transfusion. He does not need Neupogen.  I know that everybody is doing their best to try to help him. There is only so much that can be done because of his overall performance status and his nutritional state.  We had a good prayer session.!!!  [name].  Isaiah 41:10

## 2015-01-22 NOTE — Progress Notes (Addendum)
PATIENT DETAILS Name: Jeffery [name] Age: 24 y.o. Sex: male Date of Birth: 02/02/1990 Admit Date: 01/28/2015 Admitting Physician Edwin Dada, MD [id number], DAVID C, MD  Brief narrative: 24 year old male with history of cerebral palsy with spastic quadriplegia-baclofen pump in place, seizure disorder, history of pancytopenia requiring frequent PRBC transfusion/Neupogen injections, chronic respiratory failure requiring oxygen and BiPAP at home, severe dysphagia on J-tube feedings-recently diagnosed with sacral osteomyelitis and discharged home with IV Invanz on 12/6, readmitted to the hospital on 12/11 with severe sepsis likely secondary from aspiration pneumonia. Profoundly hypernatremic on admission. Admitted and started on broad-spectrum antibiotics, infectious disease was consulted. Unfortunately hospital course has been complicated by development of repeated episodes of aspiration causing respiratory distress with worsening hypoxia-requiring BiPAP support. Hospital course has also been complicated by persistent diarrhea causing hypernatremia. Family reluctant to transition to comfort measures, however DO NOT RESUSCITATE remains in place.  Subjective: Diarrhea better, Na better-but with worsening Resp failure-on BiPAP-easily desaturates off BiPAP   Assessment/Plan: Principal Problem: Severe sepsis: Likely secondary to aspiration pneumonia, but also has ongoing osteomyelitis of the sacrum. Sepsis pathophysiology has resolved. Afebrile now for past few days. Was initially on ceftaroline/flagyl-however given significantly worsened diarrhea-changed to tigecycline by infectious disease on 12/20. Plans are to continue antimicrobial therapy to Feb 11 2015.  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  Plans are to continue antimicrobial therapy to Feb 11 2015.WOC RN consulted-recommendations are to perform dressing changes daily and prn soiling  Acute on chronic hypoxic respiratory failure: Hospital course continues to be complicated by episodes of acute hypoxic respiratory failure from aspiration pneumonitis (vomiting/pooling of secretions). Chest x-ray on 12/20 shows significant left-sided infiltrate. Requiring significant BiPAP support this morning-very tachypneic to the high 30s. Family aware that given repeated acute illness-patient is very deconditioned and weaker that usual-which is likely further exacerbating aspiration episodes.  DO NOT RESUSCITATE remains in place. Palliative care following-spoke with father [name] at bedside-agreeable to add IV Ativan to decrease work of breathing. Already on IV morphine-dose increased to 2 mg as needed every 2 hours.  Aspiration pneumonia: See above. Has a long-standing issue with pooling of secretions-but seems to have had more frequent episodes over the past few days. Most recent chest x-ray on 12/20 confirms left-sided pneumonia, previous chest x-rays were concerning for right-sided pneumonia. Continue when necessary Robinul, suctioning, vibrator vest, bronchodilators etc. Unfortunate situation without good options at this point-reemphasized option of starting full comfort measures.  Diarrhea: C. difficile PCR 2 negative. Have tried numerous combinations of Imodium, Lomotil and Questran with no improvement. Could be related to antibiotics. Infectious disease has reconsulted and changed antibiotics to tigecycline. Gastroenterology following, started on Lotronex today. Family anxious to see if he can tolerate enteral feeding today-as diarrhea improved-hold off on starting TNA for now.  Hypernatremia: Suspect secondary to dehydration from sepsis, inability to start tube feeds due to aspiration/resp failure and diarrhea. Improved with IV fluids,  decrease rate to 75 mL an hour. Given shortness of breath, we'll try low-dose Lasix today.   Hypokalemia: Repleted.Likely secondary to diarrhea.  ARF: Likely prerenal azotemia in the setting of sepsis, resolved.  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 Mother reports that LFTs worsens after he gets Neupogen. RUQ ultrasound negative for hepatobiliary pathology  Anemia/Leukopenia: Chronic issue-attributed  to bone marrow suppression from Keppra and other medications. Oncology/Hematology following.On prn Neupogen and as needed PRBC transfusion  Sacral decubitus stage IV: Present prior to [facility] care following.  Dysphagia requring chronic J tube feeds: Continue regular tube feeds-is NPO. See above regarding aspiration  History of seizures:Continue Onfi and Neurontin.Ensure follow up with Dr Gaynell Face  Hx of cerebral palsy/spastic quadriplegic: Has a intrathecal baclofen pump in place.   [id number] to neurologic issues-on BIPAP Qhs  Palliative care:DNR in place-unfortunate 24 year old with cerebral palsy and spastic quadriplegia-history of seizures-now with recurrent admissions for sepsis from aspiration pneumonia and  ongoing sacral osteomyelitis. Hospital course continues to be complicated by numerous issues-hyponatremia, diarrhea, and now worsening respiratory failure. Unfortunately, after correction of 1 issue another issue develops. Family is aware of very poor overall prognosis, I have briefly discussed options for comfort care/hospice measures, however family hopeful of recovery. DO NOT RESUSCITATE remains in place.  Addendum 2:30 pm: Informed by RN that family wanting to be moved to ICU and that they do not want me to be involved in the patient's care anymore-as I suggested that morphine gtt was one of the options this morning. I briefly tried to interact with the family-the patient's mother was at bedside-she did not want to talk to  me at all. I have consulted PCCM for second opinon/see if patient a candidate to move to ICU.   I have spoken with Director of [facility]-Dr Nelly Laurence will provide emergent coverage. Patient will have another hospitalist MD tomorrow morning.  Disposition: Remain inpatient-remain in SDU  Antimicrobial agents  See below  Anti-infectives    Start     Dose/Rate Route Frequency Ordered Stop   01/22/15 0400  tigecycline (TYGACIL) 50 mg in sodium chloride 0.9 % 100 mL IVPB     50 mg 200 mL/hr over 30 Minutes Intravenous Every 12 hours 01/21/15 1526     01/21/15 1600  tigecycline (TYGACIL) 100 mg in sodium chloride 0.9 % 100 mL IVPB     100 mg 200 mL/hr over 30 Minutes Intravenous  Once 01/21/15 1526 01/21/15 1630   01/20/15 1800  ceftaroline (TEFLARO) 600 mg in sodium chloride 0.9 % 250 mL IVPB  Status:  Discontinued     600 mg 250 mL/hr over 60 Minutes Intravenous Every 12 hours 01/20/15 1405 01/21/15 1526   01/14/15 1200  cefTAZidime (FORTAZ) 2 g in dextrose 5 % 50 mL IVPB  Status:  Discontinued     2 g 100 mL/hr over 30 Minutes Intravenous Every 24 hours 01/13/15 1344 01/13/15 1553   01/13/15 1800  ceftaroline (TEFLARO) 300 mg in sodium chloride 0.9 % 250 mL IVPB  Status:  Discontinued     300 mg 250 mL/hr over 60 Minutes Intravenous Every 12 hours 01/13/15 1558 01/20/15 1405   01/13/15 1700  ceftaroline (TEFLARO) 300 mg in sodium chloride 0.9 % 250 mL IVPB  Status:  Discontinued     300 mg 250 mL/hr over 60 Minutes Intravenous Every 12 hours 01/13/15 1557 01/13/15 1558   01/13/15 1600  metroNIDAZOLE (FLAGYL) IVPB 500 mg  Status:  Discontinued     500 mg 100 mL/hr over 60 Minutes Intravenous Every 8 hours 01/13/15 1554 01/21/15 1526   01/13/15 0845  linezolid (ZYVOX) IVPB 600 mg  Status:  Discontinued     600 mg 300 mL/hr over 60 Minutes Intravenous Every 12 hours 01/08/2015 2345 01/26/2015 2350   01/09/2015 2200  linezolid (ZYVOX) IVPB 600 mg  Status:  Discontinued  600 mg 300 mL/hr over  60 Minutes Intravenous Every 12 hours 01/16/2015 2020 01/13/15 1532   01/27/2015 2200  cefTAZidime (FORTAZ) 2 g in dextrose 5 % 50 mL IVPB  Status:  Discontinued     2 g 100 mL/hr over 30 Minutes Intravenous Every 12 hours 02/01/2015 2034 01/13/15 1344      DVT Prophylaxis: SCD's  Code Status:  DNR  Family Communication Father at bedside Talked with mother-[name] yesterday Left msg for her today  Procedures: None  CONSULTS:  pulmonary/intensive care, ID and hematology/oncology,GI  Time spent 35 minutes-Greater than 50% of this time was spent in counseling, explanation of diagnosis, planning of further management, and coordination of care.  MEDICATIONS: Scheduled Meds: . acetaminophen  650 mg Oral Once  . alosetron  1 mg Oral BID  . antiseptic oral rinse  7 mL Mouth Rinse q12n4p  . belladonna-PHENObarbital  10 mL Per Tube TID  . chlorhexidine  15 mL Mouth Rinse BID  . cholestyramine  4 g Per Tube TID  . cloBAZam  3.75 mg Per Tube TID AC  . [facility] Beanaid  2 capsule Per J Tube TID  . diphenoxylate-atropine  10 mL Per Tube QID  . feeding supplement (PRO-STAT SUGAR FREE 64)  30 mL Per Tube TID  . folic acid  1 mg Oral Daily  . free water  180 mL Per Tube 3 times per day  . gabapentin  900 mg Per Tube 3 times per day  . levalbuterol  0.63 mg Nebulization Q6H  . loperamide  2 mg Per Tube Q6H  . LORazepam      . morphine      . pantoprazole sodium  40 mg Per Tube BID  . potassium chloride  40 mEq Per Tube Daily  . saccharomyces boulardii  250 mg Oral BID  . sodium chloride  3 mL Intravenous Q12H  . tigecycline (TYGACIL) IVPB  50 mg Intravenous Q12H   Continuous Infusions: . baclofen    . dextrose 5 % 1,000 mL with potassium chloride 20 mEq infusion 75 mL/hr at 01/22/15 0852  . TWOCAL HN 237 mL (01/17/15 1000)   PRN Meds:.acetaminophen (TYLENOL) oral liquid 160 mg/5 mL **OR** acetaminophen, dextrose, dicyclomine, ibuprofen, ketoconazole, levalbuterol, LORazepam, morphine  injection, zinc oxide    PHYSICAL EXAM: Vital signs in last 24 hours: Filed Vitals:   01/22/15 0834 01/22/15 0851 01/22/15 1103 01/22/15 1122  BP:   103/75   Pulse:   106   Temp: 98.5 F (36.9 C)   98.2 F (36.8 C)  TempSrc: Rectal   Rectal  Resp:   31   Height:      Weight:      SpO2:  97% 97%     Weight change:  Filed Weights   01/06/2015 2341  Weight: 58.8 kg (129 lb 10.1 oz)   Body mass index is 26.17 kg/(m^2).  Gen Exam: On BiPAP this am. Very tachypneic Neck: Supple Chest: Bilateral rales/transmitted upper airway sounds in the anteriorly CVS: S1 S2 Regular, no murmurs.  Abdomen: soft, mildly distended. G-tube in place Extremities: no edema, lower extremities warm to touch. Neurologic: Decreased bulk-0 X 5 in all 4 ext   Intake/Output from previous day:  Intake/Output Summary (Last 24 hours) at 01/22/15 1139 Last data filed at 01/22/15 1021  Gross per 24 hour  Intake   3420 ml  Output   2245 ml  Net   1175 ml     LAB RESULTS: CBC  Recent  Labs Lab 01/18/15 0330 01/19/15 0310 01/20/15 0420 01/21/15 0435 01/22/15 0500  WBC 2.3* 4.2 3.6* 2.8* 9.4  HGB 8.0* 9.4* 9.2* 10.4* 10.3*  HCT 26.7* 30.9* 31.1* 35.0* 35.7*  PLT 68* 72* 85* 97* 87*  MCV 101.9* 100.3* 102.0* 103.6* 104.1*  MCH 30.5 30.5 30.2 30.8 30.0  MCHC 30.0 30.4 29.6* 29.7* 28.9*  RDW 22.0* 21.0* 21.0* 21.1* 20.3*  LYMPHSABS 0.3* 0.4* 0.3* 0.3* 0.3*  MONOABS 0.3 0.3 0.5 0.4 1.0  EOSABS 0.1 0.1 0.1 0.1 0.1  BASOSABS 0.0 0.1 0.0 0.0 0.0    Chemistries   Recent Labs Lab 01/17/15 0400 01/18/15 0330 01/19/15 0310 01/20/15 0420 01/21/15 0435 01/22/15 0500  NA 155* 159* 152* 148* 148* 143  K 3.0* 3.5 3.6 4.7 4.1 4.1  CL 125* 127* 124* 118* 118* 110  CO2 20* 21* 19* 18* 17* 16*  GLUCOSE 107* 87 80 179* 103* 87  BUN 30* 28* 23* 20 20 15   CREATININE 1.47* 1.32* 1.21 1.18 1.05 1.13  CALCIUM 8.1* 8.2* 8.2* 8.3* 8.6* 9.0  MG 1.6*  --   --  1.3*  --   --     CBG:  Recent Labs Lab  01/21/15 1609 01/21/15 2005 01/21/15 2332 01/22/15 0313 01/22/15 0834  GLUCAP 80 84 86 97 102*    GFR Estimated Creatinine Clearance: 74.3 mL/min (by C-G formula based on Cr of 1.13).  Coagulation profile No results for input(s): INR, PROTIME in the last 168 hours.  Cardiac Enzymes No results for input(s): CKMB, TROPONINI, MYOGLOBIN in the last 168 hours.  Invalid input(s): CK  Invalid input(s): POCBNP No results for input(s): DDIMER in the last 72 hours. No results for input(s): HGBA1C in the last 72 hours. No results for input(s): CHOL, HDL, LDLCALC, TRIG, CHOLHDL, LDLDIRECT in the last 72 hours. No results for input(s): TSH, T4TOTAL, T3FREE, THYROIDAB in the last 72 hours.  Invalid input(s): FREET3 No results for input(s): VITAMINB12, FOLATE, FERRITIN, TIBC, IRON, RETICCTPCT in the last 72 hours. No results for input(s): LIPASE, AMYLASE in the last 72 hours.  Urine Studies No results for input(s): UHGB, CRYS in the last 72 hours.  Invalid input(s): UACOL, UAPR, USPG, UPH, UTP, UGL, UKET, UBIL, UNIT, UROB, ULEU, UEPI, UWBC, URBC, UBAC, CAST, UCOM, BILUA  MICROBIOLOGY: Recent Results (from the past 240 hour(s))  Culture, blood (routine x 2)     Status: None   Collection Time: 01/15/2015  2:21 PM  Result Value Ref Range Status   Specimen Description PORTA CATH A-LINE  Final   Special Requests BOTTLES DRAWN AEROBIC AND ANAEROBIC 5ML  Final   Culture NO GROWTH 5 DAYS  Final   Report Status 01/17/2015 FINAL  Final  Culture, blood (routine x 2)     Status: None   Collection Time: 01/10/2015  7:30 PM  Result Value Ref Range Status   Specimen Description BLOOD LEFT HAND  Final   Special Requests PEDIATRICS 2CC  Final   Culture NO GROWTH 5 DAYS  Final   Report Status 01/17/2015 FINAL  Final  Urine culture     Status: None   Collection Time: 02/01/2015  7:44 PM  Result Value Ref Range Status   Specimen Description URINE, RANDOM  Final   Special Requests PEDS BAG  Final    Culture 4,000 COLONIES/mL INSIGNIFICANT GROWTH  Final   Report Status 01/13/2015 FINAL  Final  C difficile quick scan w PCR reflex     Status: None   Collection Time: 01/13/15 12:45  AM  Result Value Ref Range Status   C Diff antigen NEGATIVE NEGATIVE Final   C Diff toxin NEGATIVE NEGATIVE Final   C Diff interpretation Negative for toxigenic C. difficile  Final  Urine culture     Status: None   Collection Time: 01/16/15  4:15 PM  Result Value Ref Range Status   Specimen Description URINE, CATHETERIZED  Final   Special Requests NONE  Final   Culture NO GROWTH 1 DAY  Final   Report Status 01/17/2015 FINAL  Final  Culture, respiratory (NON-Expectorated)     Status: None   Collection Time: 01/17/15 11:55 AM  Result Value Ref Range Status   Specimen Description ASPIRATE  Final   Special Requests NASAL ASPIRATE Immunocompromised  Final   Gram Stain   Final    NO WBC SEEN ABUNDANT SQUAMOUS EPITHELIAL CELLS PRESENT RARE GRAM NEGATIVE RODS Performed at [facility]    Culture   Final    MODERATE ENTEROBACTER AEROGENES Performed at [facility]    Report Status 01/21/2015 FINAL  Final   Organism ID, Bacteria ENTEROBACTER AEROGENES  Final      Susceptibility   Enterobacter aerogenes - MIC*    CEFAZOLIN >=64 RESISTANT Resistant     CEFEPIME <=1 SENSITIVE Sensitive     CEFTAZIDIME >=64 RESISTANT Resistant     CEFTRIAXONE >=64 RESISTANT Resistant     CIPROFLOXACIN <=0.25 SENSITIVE Sensitive     GENTAMICIN <=1 SENSITIVE Sensitive     IMIPENEM <=0.25 SENSITIVE Sensitive     PIP/TAZO >=128 RESISTANT Resistant     TOBRAMYCIN <=1 SENSITIVE Sensitive     TRIMETH/SULFA Value in next row Sensitive      <=20 SENSITIVE(NOTE)    AMIKACIN Value in next row Sensitive      <=20 SENSITIVE(NOTE)    * MODERATE ENTEROBACTER AEROGENES  C difficile quick scan w PCR reflex     Status: None   Collection Time: 01/19/15  7:40 AM  Result Value Ref Range Status   C Diff antigen NEGATIVE  NEGATIVE Final   C Diff toxin NEGATIVE NEGATIVE Final   C Diff interpretation Negative for toxigenic C. difficile  Final    RADIOLOGY STUDIES/RESULTS: Ct Pelvis Wo Contrast  01/01/2015  CLINICAL DATA:  Cerebral palsy.  Clinical concern for osteomyelitis. EXAM: CT PELVIS WITHOUT CONTRAST TECHNIQUE: Multidetector CT imaging of the pelvis was performed following the standard protocol without intravenous contrast. COMPARISON:  10/15/2014 CT abdomen/pelvis. FINDINGS: Reproductive: Normal prostate and seminal vesicles. Bladder: Collapsed and grossly normal urinary bladder. Visualized ureters are normal caliber. Bowel: Visualized small and large bowel are normal caliber with no bowel wall thickening. Oral contrast traverses to the distal rectum. Partially visualized is an enteric tube in the distal duodenum and proximal jejunum, with the tip not seen on this study. Vascular/Lymphatic: No pathologically enlarged lymph nodes in the pelvis. Other: No pneumoperitoneum, ascites or focal fluid collection. Musculoskeletal: There is curvilinear gas, fat stranding and ill-defined fluid in the posterior medial left gluteal subcutaneous soft tissues (series 4/ image 125), suggestive of an incompletely visualized decubitus ulcer, with extension of the fat stranding and fluid to the inferior margin of the left ischium. There is mild focal cortical erosion at the inferior left ischium with associated periosteal reaction, in keeping with osteomyelitis. No presacral fluid or fat stranding. No evidence of a sacral decubitus ulcer. Subcutaneous ventral right lower quadrant spinal stimulator device is noted with lead coursing posteriorly and entering the lumbar spinal canal, with the tip not  visualized on this study. No aggressive appearing focal osseous lesions. There is stable bilateral congenital hip dysplasia with shallow acetabula and stable chronic superolateral hip dislocation bilaterally, with stable chronic small  underdeveloped femoral heads bilaterally. There is stable diffuse osteopenia. No osseous fracture. No additional focal cortical erosions or periosteal reaction. Partially visualized is bilateral posterior spinal fusion hardware with posterior bilateral fusion rods and interlocking screws extending into the bilateral iliac wings, with no evidence of hardware fracture or loosening. IMPRESSION: 1. Osteomyelitis of the inferior left ischium associated with curvilinear fat stranding, fluid and gas in the posteromedial left gluteal subcutaneous soft tissues, presumably due to a medial left gluteal decubitus ulcer (which is incompletely visualized on this study). No focal drainable fluid collection. 2. No pelvic ascites or intraperitoneal fluid collection. No presacral fluid or inflammatory change. 3. 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 Electronically Signed   By: Ilona Sorrel M.D.   On: 01/01/2015 10:34   US Abdomen Complete  01/13/2015  CLINICAL DATA:  Acute onset of abdominal distention and sepsis. Initial encounter. EXAM: ULTRASOUND ABDOMEN COMPLETE COMPARISON:  Right upper quadrant ultrasound performed 01/05/2015, and CT of the abdomen and pelvis performed 10/15/2014 FINDINGS: Gallbladder: Stones are noted dependently in the gallbladder. The gallbladder wall is borderline prominent. No pericholecystic fluid is seen. Evaluation for ultrasonographic Murphy's sign is not possible as the patient is unconscious. Common bile duct: Diameter: 0.4 cm, within normal limits in caliber. Liver: No focal lesion identified. Within normal limits in parenchymal echogenicity. Difficult to fully assess due to the midline pain pump. IVC: No abnormality visualized. Pancreas: Not visualized. Spleen: Size and appearance within normal limits. Right Kidney: Length: 9.4 cm. Not fully assessed due to overlying bowel gas. Echogenicity within normal limits. No mass or hydronephrosis visualized. Left  Kidney: Not fully assessed due to overlying bowel gas. Echogenicity within normal limits. No mass or hydronephrosis visualized. Abdominal aorta: Not visualized due to overlying bowel gas. Other findings: None. IMPRESSION: 1. No acute abnormality seen within the abdomen. Evaluation is significantly suboptimal due to bowel gas. 2. Cholelithiasis. Borderline prominent gallbladder wall. Gallbladder otherwise grossly unremarkable. Electronically Signed   By: Garald Balding M.D.   On: 01/13/2015 23:07   Ir Replc Duoden/jejuno Tube Percut W/fluoro  01/01/2015  CLINICAL DATA:  Routine jejunostomy tube exchange EXAM: IR REPLACE DUODEN/JEJUNO TUBE PERCUT WITH FLOURO FLUOROSCOPY TIME:  12 seconds MEDICATIONS AND MEDICAL HISTORY: None ANESTHESIA/SEDATION: None CONTRAST:  5 cc Omnipaque 300 PROCEDURE: The procedure, risks, benefits, and alternatives were explained to the patient. Questions regarding the procedure were encouraged and answered. The patient understands and consents to the procedure. The abdomen was prepped with Betadine in a sterile fashion, and a sterile drape was applied covering the operative field. A sterile gown and sterile gloves were used for the procedure. The existing gastrojejunostomy tube was removed over a stiff glidewire and exchange for a new make jejunostomy tube. The tip was positioned in the proximal jejunum. Contrast was injected. 10 cc saline was utilized to insufflate the balloon. FINDINGS: The jejunostomy tube is been exchange. Tip is in the proximal jejunum. COMPLICATIONS: None IMPRESSION: Successful jejunostomy tube exchange. Electronically Signed   By: Marybelle Killings M.D.   On: 01/01/2015 12:18   Dg Chest Port 1 View  01/21/2015  CLINICAL DATA:  Leukopenia. EXAM: PORTABLE CHEST 1 VIEW COMPARISON:  01/16/2015 FINDINGS: Right-sided Port-A-Cath terminates over the lower SVC, unchanged. Cardiomediastinal silhouette is unchanged. The lungs remain hypoinflated. There is mild right basilar  opacity which has improved. Left basilar greater than mid lung opacities have worsened. No large pleural effusion or definite pneumothorax is identified. Spinal fusion hardware is noted. IMPRESSION: Persistent hypoinflation with improved aeration of the right lung base. Worsening left lung aeration concerning for pneumonia. Electronically Signed   By: Logan Bores M.D.   On: 01/21/2015 15:02   Dg Chest Port 1 View  01/16/2015  CLINICAL DATA:  Shortness of breath. EXAM: PORTABLE CHEST 1 VIEW COMPARISON:  January 15, 2015. FINDINGS: Hypoinflation of the lungs is noted. Stable cardiomediastinal silhouette. Status post surgical fusion of the thoracic and lumbar spine. Mild to moderate bibasilar opacities are noted concerning for subsegmental atelectasis or possibly pneumonia. No pneumothorax is noted. No significant pleural effusion is noted. IMPRESSION: Mild to moderate bibasilar opacities are noted concerning for subsegmental atelectasis or pneumonia. Hypoinflation of the lungs is again noted. Electronically Signed   By: Marijo Conception, M.D.   On: 01/16/2015 18:50   Dg Chest Port 1 View  01/15/2015  CLINICAL DATA:  Pneumonia. EXAM: PORTABLE CHEST 1 VIEW COMPARISON:  01/20/2015 FINDINGS: Chronic low lung volumes. Increased densities at the left lung base are concerning for worsening atelectasis or infection. Stable appearance of the heart and mediastinum. Port-A-Cath in the SVC region. Stable appearance of the spinal surgical hardware. IMPRESSION: Increased densities in the left lower lobe are concerning for worsening atelectasis or infection. Chronic low lung volumes. Electronically Signed   By: Markus Daft M.D.   On: 01/15/2015 13:02   Dg Chest Portable 1 View  01/23/2015  CLINICAL DATA:  Fever. EXAM: PORTABLE CHEST 1 VIEW COMPARISON:  December 3rd 2016. FINDINGS: Stable chronic low lung volumes are noted. Stable surgical fusion of thoraco lumbar spine is noted. No pneumothorax is noted. Increased  bibasilar linear densities are noted most consistent with worsening subsegmental atelectasis or possibly pneumonia. Cardiomediastinal silhouette is unchanged. IMPRESSION: Stable chronic low lung volumes are noted. Increased bibasilar linear densities are noted most consistent with worsening subsegmental atelectasis or possibly pneumonia. Electronically Signed   By: Marijo Conception, M.D.   On: 01/15/2015 20:01   Dg Chest Port 1 View  01/04/2015  CLINICAL DATA:  Shortness of breath and fever.  Recent pneumonia. EXAM: PORTABLE CHEST 1 VIEW COMPARISON:  01/01/2015 and 12/31/2014. FINDINGS: 0814 hours. There are chronic low lung volumes. The patient's mandible overlies the right lung apex. Chronic bibasilar atelectasis or scarring is unchanged. There is no airspace disease, edema or pleural effusion. Right subclavian Port-A-Cath appears unchanged. The bones appear unchanged status post thoracolumbar fusion. IMPRESSION: No significant change in chronic bibasilar atelectasis or scarring. No acute findings demonstrated. Electronically Signed   By: Richardean Sale M.D.   On: 01/04/2015 10:29   Portable Chest 1 View  01/01/2015  CLINICAL DATA:  Shortness of breath, cough, dyspnea.  Cerebral palsy EXAM: PORTABLE CHEST 1 VIEW COMPARISON:  Yesterday FINDINGS: Porta catheter on the right with tip in good position at the SVC level. Persistent hypoventilation and linear opacities at the bases. There is no edema, consolidation, effusion, or pneumothorax. Normal heart size and mediastinal contours. Extensive spinal fixation. IMPRESSION: Chronic hypoventilation with basilar atelectasis or scarring. Electronically Signed   By: Monte Fantasia M.D.   On: 01/01/2015 07:40   Dg Chest Port 1 View  12/31/2014  CLINICAL DATA:  Chest pain recent pneumonia persistent fever for 1 week EXAM: PORTABLE CHEST 1 VIEW COMPARISON:  10/19/2014 FINDINGS: Spinal rods noted. Port-A-Cath stable on the right. Low lung volumes with bibasilar  atelectasis. Heart size normal. IMPRESSION: Mild bilateral lower lobe atelectasis. No definite evidence of pneumonia. Subtle infiltrate could be due obscured by the bilateral lower lobe atelectasis however. Electronically Signed   By: Skipper Cliche M.D.   On: 12/31/2014 17:39   Dg Abd Portable 1v  01/19/2015  CLINICAL DATA:  Distended abdomen.  Sepsis. EXAM: PORTABLE ABDOMEN - 1 VIEW COMPARISON:  01/18/2015 and prior exams FINDINGS: Gaseous distention of the colon has decreased. No dilated small bowel loops are noted. Spinal surgical hardware and spinal pump again noted. Enteric tube again noted. IMPRESSION: Decreased colonic distention. Electronically Signed   By: Margarette Canada M.D.   On: 01/19/2015 14:30   Dg Abd Portable 1v  01/18/2015  CLINICAL DATA:  Gaseous abdominal distention EXAM: PORTABLE ABDOMEN - 1 VIEW COMPARISON:  01/15/2015 FINDINGS: Mild gas distention of ascending and transverse colon. Transverse colon measures up to 8.9 cm diameter. Scattered gas remainder of abdomen including sigmoid colon. No definite bowel wall thickening. Extensive prior spinal fixation anchored into pelvis. Osseous demineralization. Jejunostomy through gastrostomy tube again noted. RIGHT lower quadrant infusion pump with catheter extending to spine. IMPRESSION: Mild gas distention of the proximal half of colon known significant gas is also seen in the sigmoid colon, question ileus. Electronically Signed   By: Lavonia Dana M.D.   On: 01/18/2015 12:36   Dg Abd Portable 1v  01/15/2015  CLINICAL DATA:  Abdominal distension. EXAM: PORTABLE ABDOMEN - 1 VIEW COMPARISON:  10/15/2014 FINDINGS: Spinal hardware throughout the thoracolumbar spine extending into the pelvis. Chronic changes involving the pelvic bones and hips. There is a GJ feeding tube and the tip is probably in the jejunal region. Again noted is an implant infusion device in the right lower abdomen. Few gas-filled loops of bowel but nonobstructive pattern.  IMPRESSION: No acute abnormality. Electronically Signed   By: Markus Daft M.D.   On: 01/15/2015 13:08   US Abdomen Limited Ruq  01/05/2015  CLINICAL DATA:  Elevated liver function tests. Cerebral palsy. Feeding tube and implanted pain pump in the right upper quadrant of the abdomen overlying the gallbladder area. EXAM: US ABDOMEN LIMITED - RIGHT UPPER QUADRANT COMPARISON:  Limited abdomen ultrasound dated 11/06/2014. Abdomen CT dated 10/15/2014. FINDINGS: Gallbladder: Multiple gallstones in the gallbladder measuring up to 7 mm in maximum diameter each. No gallbladder wall thickening or pericholecystic fluid. No apparent pain over the gallbladder. Common bile duct: Diameter: 3.8 mm Liver: Normal echogenicity.  No mass or biliary ductal dilatation seen. IMPRESSION: 1. Cholelithiasis without evidence cholecystitis. 2. Otherwise, unremarkable examination. Electronically Signed   By: Claudie Revering M.D.   On: 01/05/2015 15:59    Oren Binet, MD  [facility] Pager:[phone]  If 7PM-7AM, please contact night-coverage [email or web address] Password [email or web address] 01/22/2015, 11:39 AM   LOS: 10 days

## 2015-01-22 NOTE — Progress Notes (Signed)
Patient's respiratory rate remains elevated with respirations 32-40 bpm. Sp02 level remains at 96% on 60% Fio2. Father requested for patients fio2 be increased to 100%. Will continue to monitor respiratory status.

## 2015-01-22 NOTE — Progress Notes (Signed)
Pt's mother came in and wanted the catheter out. She was pulling on his catheter and asked for a syringe from [facility]. This RN removed foley per previous order and policy. Mother placed condom cath. Scrotal skin breakdown noted.   Pt's respirations in high 20's - high 30's. Morphine 1-2 mg IVP PRN ordered for increased work of breathing. Pt's family refusing subsequent morphine doses--mother stated "he is not suffering."  Upon pt's mother entering room, she firmly stated that she would not talk to anyone and that she did not want Dr. Sloan Leiter to be the MD anymore. When asked why she stated that he said, "We should just start the pt on a morphine drip and let him die." What Dr. Sloan Leiter actually said was, "We can start him on a morphine drip and make him comfortable."

## 2015-01-22 NOTE — Progress Notes (Signed)
RN not allowed to participate in patient's bedside care due to mother refusing RN assigned to room. See previous RN's note. Security, AD, AC and covering PA informed and updated that patient's mother is obstructing care, all present at bedside. Mother left for the evening (around 9:30p,) according to patient's step-father and assigned RN was now allowed back in to provide direct patient care. RN entered room at approximately 10pm to assess patient and administer assigned medications. RN will continue to monitor.

## 2015-01-22 NOTE — Progress Notes (Signed)
   01/22/15 1300  Clinical Encounter Type  Visited With Family  Visit Type Follow-up  Spiritual Encounters  Spiritual Needs Emotional;Grief support  CH check in on family; MD gave realistic outlook this AM and family still unrealistic; [place] listened to family concerns; family is still hoping to move to ICU or other unit for more selective care; RN's exhausted and concerns for patient well being and potential for increased suffering; [employer] would still recommend an ethics consult. Gwynn Burly 1:48 PM

## 2015-01-22 NOTE — Progress Notes (Addendum)
Daily Progress Note   Patient Name: Jeffery [name]       Date: 01/22/2015 DOB: 01-16-1991  Age: 24 y.o. MRN#: [id number] Attending Physician: Jonetta Osgood, MD Primary Care Physician: Marijean Bravo, MD Admit Date: 01/19/2015  Reason for Consultation/Follow-up: Establishing goals of care, Non pain symptom management and Psychosocial/spiritual support  Subjective: Met with patient and his guardian, Jeffery [name], today on multiple occasions throughout day.  She reports that she is unsatisfied with his care and has a long list of what she feels are events that have negatively impacted Jeffery [name]'s hospitalization.  She reports that she has always been satisfied with care he has received in this unit (3S) but "something is different this admission."  She reports being "tired of negative talk, especially in front of Jeffery [name]" and that she feels that "people are giving up on him."  She relays story where security was called on her husband and that she cannot believe that this occurred.  She reports that she is unhappy with current attending provider and that she no longer wants him in the room.  She states that he "is a good doctor and has been great other admissions, but he has just given up on [name], too."   She feels that his condition is improving overall and she thinks that his stomach is less distended.  She reports praying over him yesterday and that she thinks that he is going to continue to show small improvements daily moving forward.  She does not engage in conversation with possibility of him not improving to his former baseline.  See [place] section below for further details.  Length of Stay: 10 days  Current Medications: Scheduled Meds:  . acetaminophen  650 mg Oral Once  . alosetron  1 mg Oral  BID  . antiseptic oral rinse  7 mL Mouth Rinse q12n4p  . belladonna-PHENObarbital  10 mL Per Tube TID  . chlorhexidine  15 mL Mouth Rinse BID  . cholestyramine  4 g Per Tube TID  . cloBAZam  3.75 mg Per Tube TID AC  . [employer] [facility] Beanaid  2 capsule Per J Tube TID  . diphenoxylate-atropine  10 mL Per Tube QID  . feeding supplement (PRO-STAT SUGAR FREE 64)  30 mL Per Tube TID  . folic acid  1 mg  Oral Daily  . free water  180 mL Per Tube 3 times per day  . gabapentin  900 mg Per Tube 3 times per day  . levalbuterol  0.63 mg Nebulization Q6H  . loperamide  2 mg Per Tube Q6H  . pantoprazole sodium  40 mg Per Tube BID  . potassium chloride  40 mEq Per Tube Daily  . saccharomyces boulardii  250 mg Oral BID  . sodium chloride  3 mL Intravenous Q12H  . tigecycline (TYGACIL) IVPB  50 mg Intravenous Q12H    Continuous Infusions: . baclofen    . dextrose 5 % 1,000 mL with potassium chloride 20 mEq infusion 75 mL/hr at 01/22/15 0852  . TWOCAL HN 237 mL (01/17/15 1000)    PRN Meds: acetaminophen (TYLENOL) oral liquid 160 mg/5 mL **OR** acetaminophen, dextrose, dicyclomine, ibuprofen, ketoconazole, levalbuterol, LORazepam, morphine injection, zinc oxide  Physical Exam: Physical Exam  Constitutional:  On bipap  Cardiovascular: Normal rate.   No murmur heard. Pulmonary/Chest:  shallow  Abdominal: He exhibits distension.  GJ tube in place  Musculoskeletal:  Contractures noted  Skin: Skin is warm and dry.                Vital Signs: BP 129/54 mmHg  Pulse 108  Temp(Src) 98 F (36.7 C) (Rectal)  Resp 36  Ht 4' 11"  (1.499 m)  Wt 58.8 kg (129 lb 10.1 oz)  BMI 26.17 kg/m2  SpO2 96% SpO2: SpO2: 96 % O2 Device: O2 Device: Bi-PAP O2 Flow Rate: O2 Flow Rate (L/min): 12 L/min  Intake/output summary:  Intake/Output Summary (Last 24 hours) at 01/22/15 2223 Last data filed at 01/22/15 1900  Gross per 24 hour  Intake 2898.33 ml  Output   1525 ml  Net 1373.33 ml   LBM: Last BM Date:  01/22/15 (flexiseal) Baseline Weight: Weight: 58.8 kg (129 lb 10.1 oz) Most recent weight: Weight: 58.8 kg (129 lb 10.1 oz)       Palliative Assessment/Data: Flowsheet Rows        Most Recent Value   Intake Tab    Referral Department  Hospitalist   Unit at Time of Referral  Cardiac/Telemetry Unit   Palliative Care Primary Diagnosis  Sepsis/Infectious Disease   Date Notified  01/07/2015   Palliative Care Type  New Palliative care   Reason for referral  Clarify Goals of Care   Date of Admission  01/05/2015   Date first seen by Palliative Care  01/21/15   # of days IP prior to Palliative referral  0   Clinical Assessment    Palliative Performance Scale Score  10%   Psychosocial & Spiritual Assessment    Palliative Care Outcomes    Patient/Family meeting held?  Yes   Who was at the meeting?  father    Palliative Care Outcomes  Improved non-pain symptom therapy   Palliative Care follow-up planned  Yes, Facility      Additional Data Reviewed: CBC    Component Value Date/Time   WBC 9.4 01/22/2015 0500   WBC 5.7 11/08/2014 0827   RBC 3.43* 01/22/2015 0500   RBC 2.18* 12/31/2014 1450   RBC 2.74* 11/08/2014 0827   HGB 10.3* 01/22/2015 0500   HGB 8.9* 11/08/2014 0827   HCT 35.7* 01/22/2015 0500   HCT 30.0* 11/08/2014 0827   PLT 87* 01/22/2015 0500   PLT 115* 11/08/2014 0827   MCV 104.1* 01/22/2015 0500   MCV 110* 11/08/2014 0827   MCH 30.0 01/22/2015 0500   MCH  32.5 11/08/2014 0827   MCHC 28.9* 01/22/2015 0500   MCHC 29.7* 11/08/2014 0827   RDW 20.3* 01/22/2015 0500   RDW 20.2* 11/08/2014 0827   LYMPHSABS 0.3* 01/22/2015 0500   LYMPHSABS 0.4* 11/08/2014 0827   MONOABS 1.0 01/22/2015 0500   EOSABS 0.1 01/22/2015 0500   EOSABS 0.1 11/08/2014 0827   BASOSABS 0.0 01/22/2015 0500   BASOSABS 0.0 11/08/2014 0827    CMP     Component Value Date/Time   NA 143 01/22/2015 0500   NA 138 11/08/2014 0828   K 4.1 01/22/2015 0500   K 4.1 11/08/2014 0828   CL 110 01/22/2015 0500     CL 101 11/08/2014 0828   CO2 16* 01/22/2015 0500   CO2 30 11/08/2014 0828   GLUCOSE 87 01/22/2015 0500   GLUCOSE 97 11/08/2014 0828   BUN 15 01/22/2015 0500   BUN 16 11/08/2014 0828   CREATININE 1.13 01/22/2015 0500   CREATININE 0.6 11/08/2014 0828   CALCIUM 9.0 01/22/2015 0500   CALCIUM 9.7 11/08/2014 0828   PROT 4.9* 01/22/2015 0500   PROT 6.2* 11/08/2014 0828   ALBUMIN 1.7* 01/22/2015 0500   ALBUMIN 3.1* 11/08/2014 0828   AST 14* 01/22/2015 0500   AST 40* 11/08/2014 0828   ALT 12* 01/22/2015 0500   ALT 69* 11/08/2014 0828   ALKPHOS 437* 01/22/2015 0500   ALKPHOS 147* 11/08/2014 0828   BILITOT 0.2* 01/22/2015 0500   BILITOT 0.50 11/08/2014 0828   GFRNONAA >60 01/22/2015 0500   GFRAA >60 01/22/2015 0500       Problem List:  Patient Active Problem List   Diagnosis Date Noted  . Goals of care, counseling/discussion   . Increased oropharyngeal secretions   . Encounter for palliative care   . Enterobacter cloacae pneumonia ([place])   . Diarrhea   . Abdominal distension, gaseous   . Septic shock ([place])   . Osteomyelitis, pelvic region and thigh ([place])   . PNA (pneumonia)   . Hypernatremia 01/29/2015  . AKI (acute kidney injury) ([place]) 01/11/2015  . Leukopenia   . Malnutrition ([place])   . Elevated liver enzymes   . Osteomyelitis ([place])   . Pressure ulcer 01/01/2015  . Chronic leukopenia   . Candidal UTI (urinary tract infection)   . Hypothermia   . Hyperthermia   . Pancytopenia ([place])   . Acute respiratory failure with hypoxia ([place])   . Lactic acidosis   . Seizures ([place])   . 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 Hypokalemia   . Aspiration pneumonia due to food (regurgitated) ([place])   . Cerebral palsy ([place])   . Convulsion ([place])   . Functional quadriplegia ([facility])   . Other pancytopenia ([place])   . Severe sepsis ([place]) 10/09/2014  . HCAP (healthcare-associated pneumonia) 10/09/2014  . Sinus tachycardia ([place]) 10/09/2014  . Elevated LFTs 10/09/2014  . Transaminitis 10/09/2014   . Encounter for gastrojejunal ([place]) tube placement   . Palliative care encounter   . Abdominal distention   . Erythema, possible cellulitis 07/21/2014  . Enteritis 07/21/2014  . Sepsis ([place]) 07/20/2014  . Sepsis due to pneumonia ([place]) 07/13/2014  . Anemia of chronic disease 07/13/2014  . Elevated lactic acid level 07/13/2014  . Fever 07/13/2014  . Hyperglycemia, drug-induced 07/13/2014  . Acquired pancytopenia ([place])   . Cerebral palsy, quadriplegic ([place])   . OSA (obstructive sleep apnea)   . Acute on chronic respiratory failure with hypoxia ([place])   . Aspiration pneumonia ([place]) 11/25/2012  . Dysphagia requring chronic J tube  feeds   . Neuromuscular scoliosis   . 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 May use oxygen, suction and manual treatment of airway obstruction as needed for comfort.      01/10/2015 2233    Advance Directive Documentation        Most Recent Value   Type of Advance Directive  Living will   Pre-existing out of facility DNR order (yellow form or pink MOST form)     "MOST" Form in Place?         2. Goals of Care/Additional Recommendations:  Met with patient's guardian, Jeffery [name] today on several occasions.  Last encounter was for family meeting with Dr. Nelda Marseille.  She continues to report that she is "willing to let him go when the angles come to get him" but that "we are not there yet" and "everyone has written him off."  She spent most of  our time together today discussing her perception of "poor care all around" that Jeffery [name] has been getting and recited a long list of "major mistakes" from nursing, medical staff, administration, and security that she believes have contributed to him not recovering as he has in the past.  She reports that "negative talk" in front of Jeffery [name] is obviously upsetting to him as she has noticed changes in his respiratory pattern when "people who upset him talk in the room."  In the past, she has felt that 3S was "our safe space, but that has been taken from [place] too."  She denies impeding his care and reports that she "just tries to educate" staff on how she feels Jeffery [name] is best cared for.  She would like transferred to another unit.  Dr. Nelda Marseille will speak with attending to discuss possibility of lateral transfer to another stepdown unit (3W).  I am concerned that we are prolonging the dying process rather than providing care with realistic possibility of Jeffery [name] clinically improving.  At the same time, Jeffery [name] is concerned that "he isn't getting care he should because people have given up on him."  I placed an ethics consult.  I discussed with on-call ethics who recommended that first step is to ascertain if all providers involved in his care feel that this would fall under scope of medically futile care.   Limitations on Scope of Treatment: Patient is DNR/DNI. Continue full medical care up to point of respiratory or cardiac arrest.  Desire for further Chaplaincy support: "I only want Pat"  Psycho-social Needs: Caregiving  Support/Resources, Education on Hospice and Grief/Bereavement Support  3. Symptom Management:      1.Diarrhea: Continue with current therapies.  GI has recommended trial of Lotronex, which needed to be special ordered 2. Dyspnea: Morphine on as needed basis.  His mother has been noted to be refusing this at times, but he did receive dose prior to my last encounter  with patient  4. Palliative  Prophylaxis:   Aspiration  5. Prognosis: Likely days at best.  6. Discharge Planning:  To be determined.  There is a high likelihood that this will be a terminal admission.   Care plan was discussed with patient's guardian, Dr. Sloan Leiter, Dr. Nelda Marseille, beside nurse  Thank you for allowing the Palliative Medicine Team to assist in the care of this patient.   Time In: 1320 Time Out: 1430 Total Time 70 Prolonged Time Billed  Yes        Micheline Rough, MD  01/22/2015, 10:23 PM  Please contact Palliative Medicine Team phone at [phone] for questions and concerns.

## 2015-01-22 NOTE — Progress Notes (Signed)
Called by [facility]-MD as a second opinion.  Evidently, patient's mother has been less than pleased with the nursing care in 3S.  She has been making multiple demands on the nurses which are rather unreasonable.  I met separately with the nurse and nursing director then with the mother and respiratory therapy.  The mother does have some valid grievances that she is requesting no discussion about patient's condition infront of the patient as that scares him which is valid.  However, what the mother is demanding is rather specific and in my opinion is an attempt to dictate care which in my opinion is futile and ineffective.  I fear that by performing all these action prolonging the patient's dying process.  After discussion with mother, escalation of care is not an option.  I encouraged her to allow for morphine use to insure comfort.  I spoke with the palliative care physician, I agree that the involvement of the ethics committee as further escalation of care is simply prolonging the patient's dying process.  As a compromise to appease the mother, I agreed to transfer the patient to another SDU, in this case 3W and that was relayed to Dr. [place] Pink who is taking over the care of the patient.  The patient is critically ill with multiple organ systems failure and requires high complexity decision making for assessment and support, frequent evaluation and titration of therapies, application of advanced monitoring technologies and extensive interpretation of multiple databases.   Critical Care Time devoted to patient care services described in this note is  35  Minutes. This time reflects time of care of this signee Dr Jennet Maduro. This critical care time does not reflect procedure time, or teaching time or supervisory time of PA/NP/Med student/Med Resident etc but could involve care discussion time.  Rush Farmer, M.D. [facility] Pulmonary/Critical Care Medicine. Pager: [phone]. After hours pager: [phone].

## 2015-01-22 NOTE — Progress Notes (Signed)
RT Note: RT attempted to wean pt from bipap to 55% VM. Sats dropped to mid 80s. Pt put back on full support, RT will continue to monitor.

## 2015-01-22 NOTE — Progress Notes (Signed)
[facility]    Subjective:  Patient's legal guardian in the room having conversations with height of care patient himself on BiPAP  Antibiotics:  Anti-infectives    Start     Dose/Rate Route Frequency Ordered Stop   01/22/15 0400  tigecycline (TYGACIL) 50 mg in sodium chloride 0.9 % 100 mL IVPB     50 mg 200 mL/hr over 30 Minutes Intravenous Every 12 hours 01/21/15 1526     01/21/15 1600  tigecycline (TYGACIL) 100 mg in sodium chloride 0.9 % 100 mL IVPB     100 mg 200 mL/hr over 30 Minutes Intravenous  Once 01/21/15 1526 01/21/15 1630   01/20/15 1800  ceftaroline (TEFLARO) 600 mg in sodium chloride 0.9 % 250 mL IVPB  Status:  Discontinued     600 mg 250 mL/hr over 60 Minutes Intravenous Every 12 hours 01/20/15 1405 01/21/15 1526   01/14/15 1200  cefTAZidime (FORTAZ) 2 g in dextrose 5 % 50 mL IVPB  Status:  Discontinued     2 g 100 mL/hr over 30 Minutes Intravenous Every 24 hours 01/13/15 1344 01/13/15 1553   01/13/15 1800  ceftaroline (TEFLARO) 300 mg in sodium chloride 0.9 % 250 mL IVPB  Status:  Discontinued     300 mg 250 mL/hr over 60 Minutes Intravenous Every 12 hours 01/13/15 1558 01/20/15 1405   01/13/15 1700  ceftaroline (TEFLARO) 300 mg in sodium chloride 0.9 % 250 mL IVPB  Status:  Discontinued     300 mg 250 mL/hr over 60 Minutes Intravenous Every 12 hours 01/13/15 1557 01/13/15 1558   01/13/15 1600  metroNIDAZOLE (FLAGYL) IVPB 500 mg  Status:  Discontinued     500 mg 100 mL/hr over 60 Minutes Intravenous Every 8 hours 01/13/15 1554 01/21/15 1526   01/13/15 0845  linezolid (ZYVOX) IVPB 600 mg  Status:  Discontinued     600 mg 300 mL/hr over 60 Minutes Intravenous Every 12 hours 01/05/2015 2345 01/14/2015 2350   01/30/2015 2200  linezolid (ZYVOX) IVPB 600 mg  Status:  Discontinued     600 mg 300 mL/hr over 60 Minutes Intravenous Every 12 hours 01/29/2015 2020 01/13/15 1532   01/23/2015 2200  cefTAZidime (FORTAZ) 2 g in dextrose 5 % 50  mL IVPB  Status:  Discontinued     2 g 100 mL/hr over 30 Minutes Intravenous Every 12 hours 01/03/2015 2034 01/13/15 1344      Medications: Scheduled Meds: . acetaminophen  650 mg Oral Once  . alosetron  1 mg Oral BID  . antiseptic oral rinse  7 mL Mouth Rinse q12n4p  . belladonna-PHENObarbital  10 mL Per Tube TID  . chlorhexidine  15 mL Mouth Rinse BID  . cholestyramine  4 g Per Tube TID  . cloBAZam  3.75 mg Per Tube TID AC  . [facility] Beanaid  2 capsule Per J Tube TID  . diphenoxylate-atropine  10 mL Per Tube QID  . feeding supplement (PRO-STAT SUGAR FREE 64)  30 mL Per Tube TID  . folic acid  1 mg Oral Daily  . free water  180 mL Per Tube 3 times per day  . furosemide  20 mg Intravenous Once  . gabapentin  900 mg Per Tube 3 times per day  . levalbuterol  0.63 mg Nebulization Q6H  . loperamide  2 mg Per Tube Q6H  . LORazepam      . morphine      .  pantoprazole sodium  40 mg Per Tube BID  . potassium chloride  40 mEq Per Tube Daily  . saccharomyces boulardii  250 mg Oral BID  . sodium chloride  3 mL Intravenous Q12H  . tigecycline (TYGACIL) IVPB  50 mg Intravenous Q12H   Continuous Infusions: . baclofen    . dextrose 5 % 1,000 mL with potassium chloride 20 mEq infusion 75 mL/hr at 01/22/15 0852  . TWOCAL HN 237 mL (01/17/15 1000)   PRN Meds:.acetaminophen (TYLENOL) oral liquid 160 mg/5 mL **OR** acetaminophen, dextrose, dicyclomine, ibuprofen, ketoconazole, levalbuterol, LORazepam, morphine injection, zinc oxide    Objective: Weight change:   Intake/Output Summary (Last 24 hours) at 01/22/15 1650 Last data filed at 01/22/15 1600  Gross per 24 hour  Intake 3673.33 ml  Output   1975 ml  Net 1698.33 ml   Blood pressure 129/54, pulse 106, temperature 98 F (36.7 C), temperature source Rectal, resp. rate 39, height 4\' 11"  (1.499 m), weight 129 lb 10.1 oz (58.8 kg), SpO2 97 %. Temp:  [98 F (36.7 C)-99.2 F (37.3 C)] 98 F (36.7 C) (12/21 1436) Pulse Rate:   [99-118] 106 (12/21 1600) Resp:  [31-44] 39 (12/21 1600) BP: (87-160)/(49-75) 129/54 mmHg (12/21 1600) SpO2:  [94 %-100 %] 97 % (12/21 1600) FiO2 (%):  [55 %-100 %] 70 % (12/21 1600)  Physical Exam: General: on BIPAP, rectal tube in place still with copious stool [facility]  Tachycardic Chest: , few rhonchi scattered Abdomen: slightly distended Extremities:contractures Skin: no rashes Neuro: nonfocal  CBC:  CBC Latest Ref Rng 01/22/2015 01/21/2015 01/20/2015  WBC 4.0 - 10.5 K/uL 9.4 2.8(L) 3.6(L)  Hemoglobin 13.0 - 17.0 g/dL 10.3(L) 10.4(L) 9.2(L)  Hematocrit 39.0 - 52.0 % 35.7(L) 35.0(L) 31.1(L)  Platelets 150 - 400 K/uL 87(L) 97(L) 85(L)      BMET  Recent Labs  01/21/15 0435 01/22/15 0500  NA 148* 143  K 4.1 4.1  CL 118* 110  CO2 17* 16*  GLUCOSE 103* 87  BUN 20 15  CREATININE 1.05 1.13  CALCIUM 8.6* 9.0     Liver Panel   Recent Labs  01/22/15 0500  PROT 4.9*  ALBUMIN 1.7*  AST 14*  ALT 12*  ALKPHOS 437*  BILITOT 0.2*       Sedimentation Rate No results for input(s): ESRSEDRATE in the last 72 hours. C-Reactive Protein No results for input(s): CRP in the last 72 hours.  Micro Results: Recent Results (from the past 720 hour(s))  Blood Culture (routine x 2)     Status: None   Collection Time: 12/31/14  2:51 PM  Result Value Ref Range Status   Specimen Description BLOOD PORTA CATH  Final   Special Requests BOTTLES DRAWN AEROBIC AND ANAEROBIC 6CC   Final   Culture NO GROWTH 5 DAYS  Final   Report Status 01/05/2015 FINAL  Final  Urine culture     Status: None   Collection Time: 12/31/14  2:54 PM  Result Value Ref Range Status   Specimen Description URINE, CATHETERIZED  Final   Special Requests NONE  Final   Culture MULTIPLE SPECIES PRESENT, SUGGEST RECOLLECTION  Final   Report Status 01/01/2015 FINAL  Final  Blood Culture (routine x 2)     Status: None   Collection Time: 12/31/14  3:50 PM  Result Value Ref Range Status   Specimen Description  BLOOD LEFT HAND  Final   Special Requests BOTTLES DRAWN AEROBIC AND ANAEROBIC 5CC  Final   Culture NO GROWTH 5 DAYS  Final   Report Status 01/05/2015 FINAL  Final  MRSA PCR Screening     Status: None   Collection Time: 12/31/14  7:17 PM  Result Value Ref Range Status   MRSA by PCR NEGATIVE NEGATIVE Final    Comment:        The GeneXpert MRSA Assay (FDA approved for NASAL specimens only), is one component of a comprehensive MRSA colonization surveillance program. It is not intended to diagnose MRSA infection nor to guide or monitor treatment for MRSA infections.   Urine culture     Status: None   Collection Time: 01/02/15  5:14 AM  Result Value Ref Range Status   Specimen Description URINE, RANDOM  Final   Special Requests NONE  Final   Culture 10,000 COLONIES/mL YEAST  Final   Report Status 01/03/2015 FINAL  Final  C difficile quick scan w PCR reflex     Status: None   Collection Time: 01/04/15  4:01 PM  Result Value Ref Range Status   C Diff antigen NEGATIVE NEGATIVE Final   C Diff toxin NEGATIVE NEGATIVE Final   C Diff interpretation Negative for toxigenic C. difficile  Final  Culture, blood (routine x 2)     Status: None   Collection Time: 01/13/2015  2:21 PM  Result Value Ref Range Status   Specimen Description PORTA CATH A-LINE  Final   Special Requests BOTTLES DRAWN AEROBIC AND ANAEROBIC 5ML  Final   Culture NO GROWTH 5 DAYS  Final   Report Status 01/17/2015 FINAL  Final  Culture, blood (routine x 2)     Status: None   Collection Time: 01/10/2015  7:30 PM  Result Value Ref Range Status   Specimen Description BLOOD LEFT HAND  Final   Special Requests PEDIATRICS 2CC  Final   Culture NO GROWTH 5 DAYS  Final   Report Status 01/17/2015 FINAL  Final  Urine culture     Status: None   Collection Time: 01/26/2015  7:44 PM  Result Value Ref Range Status   Specimen Description URINE, RANDOM  Final   Special Requests PEDS BAG  Final   Culture 4,000 COLONIES/mL INSIGNIFICANT  GROWTH  Final   Report Status 01/13/2015 FINAL  Final  C difficile quick scan w PCR reflex     Status: None   Collection Time: 01/13/15 12:45 AM  Result Value Ref Range Status   C Diff antigen NEGATIVE NEGATIVE Final   C Diff toxin NEGATIVE NEGATIVE Final   C Diff interpretation Negative for toxigenic C. difficile  Final  Urine culture     Status: None   Collection Time: 01/16/15  4:15 PM  Result Value Ref Range Status   Specimen Description URINE, CATHETERIZED  Final   Special Requests NONE  Final   Culture NO GROWTH 1 DAY  Final   Report Status 01/17/2015 FINAL  Final  Culture, respiratory (NON-Expectorated)     Status: None   Collection Time: 01/17/15 11:55 AM  Result Value Ref Range Status   Specimen Description ASPIRATE  Final   Special Requests NASAL ASPIRATE Immunocompromised  Final   Gram Stain   Final    NO WBC SEEN ABUNDANT SQUAMOUS EPITHELIAL CELLS PRESENT RARE GRAM NEGATIVE RODS Performed at [facility]    Culture   Final    MODERATE ENTEROBACTER AEROGENES Performed at [facility]    Report Status 01/21/2015 FINAL  Final   Organism ID, Bacteria ENTEROBACTER AEROGENES  Final      Susceptibility  Enterobacter aerogenes - MIC*    CEFAZOLIN >=64 RESISTANT Resistant     CEFEPIME <=1 SENSITIVE Sensitive     CEFTAZIDIME >=64 RESISTANT Resistant     CEFTRIAXONE >=64 RESISTANT Resistant     CIPROFLOXACIN <=0.25 SENSITIVE Sensitive     GENTAMICIN <=1 SENSITIVE Sensitive     IMIPENEM <=0.25 SENSITIVE Sensitive     PIP/TAZO >=128 RESISTANT Resistant     TOBRAMYCIN <=1 SENSITIVE Sensitive     TRIMETH/SULFA Value in next row Sensitive      <=20 SENSITIVE(NOTE)    AMIKACIN Value in next row Sensitive      <=20 SENSITIVE(NOTE)    * MODERATE ENTEROBACTER AEROGENES  C difficile quick scan w PCR reflex     Status: None   Collection Time: 01/19/15  7:40 AM  Result Value Ref Range Status   C Diff antigen NEGATIVE NEGATIVE Final   C Diff toxin NEGATIVE  NEGATIVE Final   C Diff interpretation Negative for toxigenic C. difficile  Final  Gastrointestinal Panel by PCR , Stool     Status: None   Collection Time: 01/21/15  1:30 PM  Result Value Ref Range Status   Campylobacter species NOT DETECTED NOT DETECTED Final   Plesimonas shigelloides NOT DETECTED NOT DETECTED Final   Salmonella species NOT DETECTED NOT DETECTED Final   Yersinia enterocolitica NOT DETECTED NOT DETECTED Final   Vibrio species NOT DETECTED NOT DETECTED Final   Vibrio cholerae NOT DETECTED NOT DETECTED Final   Enteroaggregative E coli (EAEC) NOT DETECTED NOT DETECTED Final   Enteropathogenic E coli (EPEC) NOT DETECTED NOT DETECTED Final   Enterotoxigenic E coli (ETEC) NOT DETECTED NOT DETECTED Final   Shiga like toxin producing E coli (STEC) NOT DETECTED NOT DETECTED Final   E. coli O157 NOT DETECTED NOT DETECTED Final   Shigella/Enteroinvasive E coli (EIEC) NOT DETECTED NOT DETECTED Final   Cryptosporidium NOT DETECTED NOT DETECTED Final   Cyclospora cayetanensis NOT DETECTED NOT DETECTED Final   Entamoeba histolytica NOT DETECTED NOT DETECTED Final   Giardia lamblia NOT DETECTED NOT DETECTED Final   Adenovirus F40/41 NOT DETECTED NOT DETECTED Final   Astrovirus NOT DETECTED NOT DETECTED Final   Norovirus GI/GII NOT DETECTED NOT DETECTED Final   Rotavirus A NOT DETECTED NOT DETECTED Final   Sapovirus (I, II, IV, and V) NOT DETECTED NOT DETECTED Final    Studies/Results: Dg Chest Port 1 View  01/21/2015  CLINICAL DATA:  Leukopenia. EXAM: PORTABLE CHEST 1 VIEW COMPARISON:  01/16/2015 FINDINGS: Right-sided Port-A-Cath terminates over the lower SVC, unchanged. Cardiomediastinal silhouette is unchanged. The lungs remain hypoinflated. There is mild right basilar opacity which has improved. Left basilar greater than mid lung opacities have worsened. No large pleural effusion or definite pneumothorax is identified. Spinal fusion hardware is noted. IMPRESSION: Persistent  hypoinflation with improved aeration of the right lung base. Worsening left lung aeration concerning for pneumonia. Electronically Signed   By: Logan Bores M.D.   On: 01/21/2015 15:02      Assessment/Plan:  INTERVAL HISTORY:   01/21/15: diarrhea worse, becoming more leukopenic    Principal Problem:   Severe sepsis ([place]) Active Problems:   Acute on chronic respiratory failure with hypoxia (HCC)   Cerebral palsy, quadriplegic (HCC)   Acquired pancytopenia (HCC)   Transaminitis   Seizures (HCC)   Pressure ulcer   Osteomyelitis (HCC)   Malnutrition (HCC)   Hypernatremia   AKI (acute kidney injury) ([place])   PNA (pneumonia)   Abdominal distension, gaseous  Septic shock (HCC)   Osteomyelitis, pelvic region and thigh (HCC)   Increased oropharyngeal secretions   Encounter for palliative care   Enterobacter cloacae pneumonia ([place])   Diarrhea    Jeffery [name] is a 24 y.o. male with  Cerebral palsy, quadraplegia, pelvic osteomyelitis left ischium dc on invanz with vanomcyin levels having been supratherapeutic, admitted with septic picture with fevers, ? Aspiration event. Vancomycin levels were now undetectable. He was given zyvox and ceftazidime but developed TTPenia from zyvox now on teflaro and flagyl. He has had voluminous diarrhea that started as an outpatient and has only worsened. Mx Cdiff tests negative though he received some flagyl. He has grown  an Enterobacter from "sputum culture" that was felt not to be representative of true lower airway  #1 Sepsis;  We changed his antibiotics YET AGAIN yesterday in hopes of covering both his pelvic osteomyelitis and his aspiration PNA and potentially alleviating his diarrhea--and diarrhea slightly better though not likely due to abx change  Tygacil will cover MRSA, anerobes and should cover the enterobacter ? Significance)   #2 Aspiration PNA: See above change to tygcacil will amply cover typical aspiration PNA  #3 Diarrhea: see  GI recs. I would NOT give florastor as can cause FUNGEMIA esp in compromised pt such as this in whom TPN is being considered  One COULD consider a "holiday from antibiotics to give his GI tract chance to recover but he could decompensate from resp and pelvic osteo standpoint it may still be worth donig  #4 Neutropenia: sp nupogen  #5 TTPenia from zyvox  #6 Goals of care: I would whole heartedly support move towards pure palliative care. I think this patient does not in my opinion have much of a chance of a meaningful existence with reasonable quality of life in the future. We are treating organ systems, labs, and cultures but I do not feel we are making meaningful progress to improve this patients quality of life which seems to have been very poor prior to admission.  IF AGGRESSIVE CARE TO BE PURSUED can continue current IV abxx through original stop date outlined by Dr. Johnnye Sima which would be 28 days from start of teflaro and flagyl and include his current tygacil ---> 19 more days  I DO NOT RECOMMEND PURSUING THIS AGGRESSIVE COURSE BUT WOULD RECOMMEND A PURE PALLIATIVE APPROACH  I will sign off at this point please call back with further questions re antibiotic choice        LOS: 10 days   Alcide Evener 01/22/2015, 4:49 PM

## 2015-01-22 NOTE — Progress Notes (Addendum)
Pt's mother is now requesting to reinsert foley catheter because pt has not yet voided. Order received.   Family has refused pt turns multiple times. Educated on the need to turn to help prevent more breakdown.   Leadership [facility]) and MDs are aware of all issues regarding pt and family.

## 2015-01-22 NOTE — Progress Notes (Signed)
Nutrition Follow-up  DOCUMENTATION CODES:   Not applicable  INTERVENTION:    Resume home TF regimen when okay with physicians: 3 cans TwoCal HN (parents bring from home; [facility] has a limited supply) via J-tube per day. Gatorade flushes (parents bring from home) 180 ml 8 times per day (before and after each can of TF is given plus 2 additional times per day). Prostat 30 ml TID via J-tube. TF regimen will provide 1725 kcals, 105 gm protein per day.  NUTRITION DIAGNOSIS:   Inadequate oral intake related to chronic illness, inability to eat as evidenced by NPO status.  Ongoing  GOAL:   Patient will meet greater than or equal to 90% of their needs  Unmet  MONITOR:   Labs, Weight trends, TF tolerance, Skin  REASON FOR ASSESSMENT:   Consult Enteral/tube feeding initiation and management  ASSESSMENT:   24 y.o. 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 Admitted for severe sepsis and hypernatremia.  Hospital course has been complicated by development of repeated episodes of aspiration causing respiratory distress with worsening hypoxia-requiring BiPAP support. Hospital course has also been complicated by persistent diarrhea causing hypernatremia. Family reluctant to transition to comfort measures.  Per discussion with RN, patient's dad plans on resuming TF today. TF has been off and on due to ongoing concerns for aspiration with respiratory status and need for BiPAP.    Diet Order:   NPO  Skin:  Wound (see comment) (stage IV to buttocks, stage II to buttocks)  Last BM:  12/20  Height:   Ht Readings from Last 1 Encounters:  01/05/2015 4\' 11"  (1.499 m)    Weight:   Wt Readings from Last 1 Encounters:  01/17/2015 129 lb 10.1 oz (58.8 kg)     BMI:  Body mass index is 26.17 kg/(m^2).  Estimated Nutritional Needs:   Kcal:  1400-1600  Protein:  75-95 gm  Fluid:  >/= 1.5 L  EDUCATION NEEDS:   No education needs identified at this time  Molli Barrows, [place], [place], [place] Pager [phone] After Hours Pager [phone]

## 2015-01-22 NOTE — Progress Notes (Signed)
Patient remained on Bipap during the night. Suctioned moderate amount of thick yellow secretions this morning. Patient had desaturation when bipap was removed and placed on 50% venturi mask. Sp02 returned to 97% when bipap was placed back on patient.

## 2015-01-22 NOTE — Progress Notes (Signed)
Went into pt's room to introduce Kristi as the pt's Therapist, sports. This RN said, "Steffanie Dunn will be the nurse tonight." The mother then stated, "No she won't we aren't staying on this unit. We spoke with Dr. Nelda Marseille and we are going to transfer." Kristi then said, "There are no orders to transfer at this time. But we can check again." She stated with an aggressive attitude that "You won't be taking care of him. We are transferring." Kristi said, "Let's not have a repeat of what happened over the weekend." The mother immediately stated back, "You can get out. You are not going to be the nurse." Kristi then said, "We will have to call security then if you aren't going to let me take care of your son." The mother said, "There is no need for security. This isn't a security issue." This nurse and Kristi left the room at this time and called the [facility] and security. AD present on unit and also updated on situation. Charge RN also aware.

## 2015-01-22 NOTE — Progress Notes (Signed)
RRT notified this RN of pt RR in 78s.  Pt currently on 60% Bipap with increased work of breathing.  NP on call notified and morphine scheduled PRN for pain/increased work of breathing.  Will carry out orders and continue to monitor.

## 2015-01-23 ENCOUNTER — Inpatient Hospital Stay (HOSPITAL_COMMUNITY): Payer: Medicaid Other

## 2015-01-23 ENCOUNTER — Encounter: Payer: Self-pay | Admitting: Family Medicine

## 2015-01-23 DIAGNOSIS — N179 Acute kidney failure, unspecified: Secondary | ICD-10-CM

## 2015-01-23 DIAGNOSIS — E43 Unspecified severe protein-calorie malnutrition: Secondary | ICD-10-CM

## 2015-01-23 DIAGNOSIS — D72819 Decreased white blood cell count, unspecified: Secondary | ICD-10-CM

## 2015-01-23 DIAGNOSIS — J156 Pneumonia due to other aerobic Gram-negative bacteria: Secondary | ICD-10-CM

## 2015-01-23 DIAGNOSIS — R6521 Severe sepsis with septic shock: Secondary | ICD-10-CM

## 2015-01-23 DIAGNOSIS — R569 Unspecified convulsions: Secondary | ICD-10-CM

## 2015-01-23 DIAGNOSIS — M869 Osteomyelitis, unspecified: Secondary | ICD-10-CM

## 2015-01-23 LAB — COMPREHENSIVE METABOLIC PANEL
ALT: 10 U/L — AB (ref 17–63)
AST: 13 U/L — AB (ref 15–41)
Albumin: 1.6 g/dL — ABNORMAL LOW (ref 3.5–5.0)
Alkaline Phosphatase: 333 U/L — ABNORMAL HIGH (ref 38–126)
Anion gap: 15 (ref 5–15)
BILIRUBIN TOTAL: 0.4 mg/dL (ref 0.3–1.2)
BUN: 22 mg/dL — AB (ref 6–20)
CO2: 15 mmol/L — ABNORMAL LOW (ref 22–32)
CREATININE: 1.36 mg/dL — AB (ref 0.61–1.24)
Calcium: 9.1 mg/dL (ref 8.9–10.3)
Chloride: 109 mmol/L (ref 101–111)
GFR calc Af Amer: >60 mL/min (ref >60)
Glucose, Bld: 93 mg/dL (ref 65–99)
Potassium: 4.2 mmol/L (ref 3.5–5.1)
Sodium: 139 mmol/L (ref 135–145)
TOTAL PROTEIN: 4.5 g/dL — AB (ref 6.5–8.1)

## 2015-01-23 LAB — BASIC METABOLIC PANEL
Anion gap: 14 (ref 5–15)
BUN: 24 mg/dL — AB (ref 6–20)
CHLORIDE: 110 mmol/L (ref 101–111)
CO2: 16 mmol/L — AB (ref 22–32)
CREATININE: 1.21 mg/dL (ref 0.61–1.24)
Calcium: 9.2 mg/dL (ref 8.9–10.3)
GFR calc Af Amer: >60 mL/min (ref >60)
GFR calc non Af Amer: >60 mL/min (ref >60)
Glucose, Bld: 93 mg/dL (ref 65–99)
Potassium: 4.3 mmol/L (ref 3.5–5.1)
Sodium: 140 mmol/L (ref 135–145)

## 2015-01-23 LAB — CBC WITH DIFFERENTIAL/PLATELET
BASOS PCT: 0 %
Basophils Absolute: 0 10*3/uL (ref 0.0–0.1)
EOS PCT: 0 %
Eosinophils Absolute: 0 10*3/uL (ref 0.0–0.7)
HEMATOCRIT: 30.7 % — AB (ref 39.0–52.0)
Hemoglobin: 9.3 g/dL — ABNORMAL LOW (ref 13.0–17.0)
LYMPHS ABS: 0.1 10*3/uL — AB (ref 0.7–4.0)
Lymphocytes Relative: 3 %
MCH: 30.8 pg (ref 26.0–34.0)
MCHC: 30.3 g/dL (ref 30.0–36.0)
MCV: 101.7 fL — AB (ref 78.0–100.0)
MONOS PCT: 13 %
Monocytes Absolute: 0.6 10*3/uL (ref 0.1–1.0)
Neutro Abs: 3.6 10*3/uL (ref 1.7–7.7)
Neutrophils Relative %: 84 %
Platelets: 62 10*3/uL — ABNORMAL LOW (ref 150–400)
RBC: 3.02 MIL/uL — AB (ref 4.22–5.81)
RDW: 19.7 % — AB (ref 11.5–15.5)
WBC MORPHOLOGY: INCREASED
WBC: 4.3 10*3/uL (ref 4.0–10.5)

## 2015-01-23 LAB — GLUCOSE, CAPILLARY
GLUCOSE-CAPILLARY: 101 mg/dL — AB (ref 65–99)
GLUCOSE-CAPILLARY: 97 mg/dL (ref 65–99)
Glucose-Capillary: 101 mg/dL — ABNORMAL HIGH (ref 65–99)
Glucose-Capillary: 106 mg/dL — ABNORMAL HIGH (ref 65–99)
Glucose-Capillary: 85 mg/dL (ref 65–99)
Glucose-Capillary: 91 mg/dL (ref 65–99)
Glucose-Capillary: 94 mg/dL (ref 65–99)

## 2015-01-23 LAB — MAGNESIUM: MAGNESIUM: 2 mg/dL (ref 1.7–2.4)

## 2015-01-23 MED ORDER — NOREPINEPHRINE BITARTRATE 1 MG/ML IV SOLN: 2.0000 ug/min | INTRAVENOUS | Status: DC

## 2015-01-23 MED ORDER — SODIUM CHLORIDE 0.9 % IV SOLN: INTRAVENOUS | Status: DC | PRN

## 2015-01-23 MED ORDER — FENTANYL CITRATE (PF) 100 MCG/2ML IJ SOLN
25.0000 ug | INTRAMUSCULAR | Status: DC | PRN
  Administered 2015-01-24 (×2): 25 ug via INTRAVENOUS
  Administered 2015-01-24: 50 ug via INTRAVENOUS
  Filled 2015-01-23 (×3): qty 2

## 2015-01-23 MED ORDER — ALOSETRON HCL 1 MG PO TABS
1.0000 mg | ORAL_TABLET | Freq: Two times a day (BID) | ORAL | Status: DC
  Filled 2015-01-23 (×2): qty 1

## 2015-01-23 MED ORDER — FILGRASTIM 300 MCG/ML IJ SOLN
300.0000 ug | Freq: Once | INTRAMUSCULAR | Status: AC
  Administered 2015-01-24: 300 ug via SUBCUTANEOUS
  Filled 2015-01-23: qty 1

## 2015-01-23 MED ORDER — SODIUM CHLORIDE 0.9 % IV BOLUS (SEPSIS)
500.0000 mL | Freq: Once | INTRAVENOUS | Status: AC
  Administered 2015-01-23: 500 mL via INTRAVENOUS

## 2015-01-23 MED ORDER — INSULIN ASPART 100 UNIT/ML ~~LOC~~ SOLN: 0.0000 [IU] | SUBCUTANEOUS | Status: DC

## 2015-01-23 MED ORDER — NOREPINEPHRINE BITARTRATE 1 MG/ML IV SOLN
0.0000 ug/min | INTRAVENOUS | Status: DC
  Administered 2015-01-23: 2 ug/min via INTRAVENOUS
  Administered 2015-01-24: 40 ug/min via INTRAVENOUS
  Filled 2015-01-23 (×2): qty 4

## 2015-01-23 MED ORDER — LEVALBUTEROL HCL 0.63 MG/3ML IN NEBU
0.6300 mg | INHALATION_SOLUTION | RESPIRATORY_TRACT | Status: DC | PRN
  Administered 2015-01-23: 0.63 mg via RESPIRATORY_TRACT
  Filled 2015-01-23: qty 3

## 2015-01-23 MED ORDER — DIPHENHYDRAMINE HCL 50 MG/ML IJ SOLN
12.5000 mg | Freq: Once | INTRAMUSCULAR | Status: AC
  Administered 2015-01-24: 12.5 mg via INTRAVENOUS
  Filled 2015-01-23 (×2): qty 1

## 2015-01-23 MED ORDER — FUROSEMIDE 10 MG/ML IJ SOLN
40.0000 mg | Freq: Once | INTRAMUSCULAR | Status: AC
  Administered 2015-01-23: 40 mg via INTRAVENOUS
  Filled 2015-01-23: qty 4

## 2015-01-23 MED ORDER — STERILE WATER FOR INJECTION IV SOLN
INTRAVENOUS | Status: DC
  Administered 2015-01-24: 01:00:00 via INTRAVENOUS
  Filled 2015-01-23 (×5): qty 850

## 2015-01-23 NOTE — Progress Notes (Signed)
Patient taken off of Bipap and placed on 4L nasal cannula.  Patient is currently tolerating well, with oxygen sats currently 94%.  NTS patient and obtained a small amount of thick tan secretions.  Will continue to monitor patient.

## 2015-01-23 NOTE — Progress Notes (Signed)
RT called to patient room, by RN, due to MD being in room and wanting to take patient off of [place].  Patient was placed on NRB and oxygen saturation began to drop to high 70s.  NTS patient, by request of father and approved by MD, and suctioned out a copious amount of tan, pink tinged secretions.  Sats still did not improve.  Per MD, patient is to not be placed back on bipap due to increased amount of secretions and fear that patient will not be able to protect airway.  RN gave patient another dose of morphine and respiratory rate decreased from 40s to 30s.  Patient sats still currently at 84%.  Will continue to monitor patient.

## 2015-01-23 NOTE — Procedures (Signed)
Intubation Procedure Note Jeffery [name] SK:[id number] 08-31-1990  Procedure: Intubation Indications: Respiratory insufficiency  Procedure Details Consent: Unable to obtain consent because of emergent medical necessity. Time Out: Verified patient identification, verified procedure, site/side was marked, verified correct patient position, special equipment/implants available, medications/allergies/relevent history reviewed, required imaging and test results available.  Performed  Maximum sterile technique was used including hand hygiene.  MAC and 3    Evaluation Hemodynamic Status: BP stable throughout; O2 sats: stable throughout Patient's Current Condition: stable Complications: No apparent complications Patient did tolerate procedure well. Chest X-ray ordered to verify placement.  CXR: pending.   [name] 01/23/2015

## 2015-01-23 NOTE — Progress Notes (Signed)
[facility] PROGRESS NOTE Subjective Still with diarrhea, family wants to take home. Upset with care. States that no one is helping him. He is still having loose stools  Objective: Vital signs in last 24 hours: Temp:  [97.9 F (36.6 C)-100.2 F (37.9 C)] 97.9 F (36.6 C) (12/22 0854) Pulse Rate:  [100-108] 103 (12/22 0807) Resp:  [26-39] 26 (12/22 0807) BP: (91-160)/(44-75) 106/45 mmHg (12/22 0807) SpO2:  [94 %-97 %] 96 % (12/22 0807) FiO2 (%):  [50 %-70 %] 50 % (12/22 0807) Last BM Date: 01/22/15 (flexiseal)  Intake/Output from previous day: 12/21 0701 - 12/22 0700 In: 3083.3 [I.V.:1943.3; IV Piggyback:250] Out: 1875 [Urine:1100; Stool:775] Intake/Output this shift: Total I/O In: -  Out: 150 [Urine:150]    stool output 750 mL yesterday  PE: General-- nonresponsive  Abdomen-- soft and flat  Lab Results:  Recent Labs  01/21/15 0435 01/22/15 0500 01/23/15 0409  WBC 2.8* 9.4 4.3  HGB 10.4* 10.3* 9.3*  HCT 35.0* 35.7* 30.7*  PLT 97* 87* 62*   BMET  Recent Labs  01/21/15 0435 01/22/15 0500 01/23/15 0409  NA 148* 143 139  K 4.1 4.1 4.2  CL 118* 110 109  CO2 17* 16* 15*  CREATININE 1.05 1.13 1.36*   LFT  Recent Labs  01/22/15 0500 01/23/15 0409  PROT 4.9* 4.5*  AST 14* 13*  ALT 12* 10*  ALKPHOS 437* 333*  BILITOT 0.2* 0.4   PT/INR No results for input(s): LABPROT, INR in the last 72 hours. PANCREAS No results for input(s): LIPASE in the last 72 hours.       Studies/Results: Dg Chest Port 1 View  01/21/2015  CLINICAL DATA:  Leukopenia. EXAM: PORTABLE CHEST 1 VIEW COMPARISON:  01/16/2015 FINDINGS: Right-sided Port-A-Cath terminates over the lower SVC, unchanged. Cardiomediastinal silhouette is unchanged. The lungs remain hypoinflated. There is mild right basilar opacity which has improved. Left basilar greater than mid lung opacities have worsened. No large pleural effusion or definite pneumothorax is identified. Spinal fusion  hardware is noted. IMPRESSION: Persistent hypoinflation with improved aeration of the right lung base. Worsening left lung aeration concerning for pneumonia. Electronically Signed   By: Logan Bores M.D.   On: 01/21/2015 15:02    Medications: I have reviewed the patient's current medications.  Assessment/Plan: 1. Diarrhea. This is no worse but is certainly not any better. Another G.I. pathogen panel was negative for enteric pathogens. Lactoferrin in the stool is positive. He's had several C. difficile toxins that are negative. Have discussed with parents. He's only had about 24 hours on all of the newer medications and his antibiotics have been change. If he is not improved by tomorrow, we will plan on unprepped bedside flexible sigmoidoscopy which can be done safely with biopsy of his colon.   Jerimy Johanson JR,Eldin Bonsell L 01/23/2015, 9:50 AM  Pager: [phone] If no answer or after hours call [phone]

## 2015-01-23 NOTE — Progress Notes (Signed)
   01/23/15 2000  Clinical Encounter Type  Visited With Family  Visit Type Follow-up;Psychological support;Spiritual support;Social support;Critical Care;Code  Referral From Family;Nurse;Physician  Consult/Referral To Chaplain  Spiritual Encounters  Spiritual Needs Emotional;Grief support;Other (Comment) (Conference with family re code status)  Ch was asked to speak with guardian/HCPOA regarding status; Mother/HCPOA indicated move from DNR to Partial with no compressions; requested in threatening tone to have pt moved to ICU and indicated possibility of lawsuit if requests not followed; [place] transmitted change of status to RN and [facility] and indicated HCPOA threatened legal action; pt subsequently after intubation moved to ICU; [facility] liaison with family and RN in transfer; [facility] will continue to monitor as needed. 9:29 PM Gwynn Burly

## 2015-01-23 NOTE — Progress Notes (Signed)
IMTS called to bedside by code blue pager. Upon arrival, they informed us that Patient is partial code- intubation only. Intubation underway.    Burgess Estelle MD PGY 1

## 2015-01-23 NOTE — Progress Notes (Signed)
ABG obtained on patient per MD order.  Critical value results obtained and sent to MD.  PH: 7.00, PaCO2: 53.3, PaO2: 77, HCO3: 13.0, SpO2: 85%.  Per MD no further actions taken and leave patient on NRB.  Will continue to monitor.

## 2015-01-23 NOTE — Progress Notes (Signed)
Observed at  approximately 1530 patient's adoptive/ foster parent Jeffery [name] walking into room, closed door and curtain. IV team was there to reaccess porta cath. Jeffery [name] refused IV intervention at time and stated that they were having a prayer session with just the family. After prayer session with family,  mother placed cross above patient's bed and left. When I asked foster parent Jeffery [name] if Jeffery [name] is ok, Jeffery [name] replied that she just wanted to have a prayer session with family and lay hands on [name] because she felt that there were evil spirits on [address].

## 2015-01-23 NOTE — Progress Notes (Signed)
Was notified by Rachel Bo RN that pt's sats dropping to low 70s on 100% NRB. POA requested that pt be intubated and have code status changed to partial code so he could be mechanically ventilated. POA also demanding PICC line and TPN once again. RT in room intubating patient. Besides hypoxia, other VSS. I spoke with Dr. Vaughan Browner with [facility] regarding the situation and have also notified Dr. Hal Hope with [facility]. Pt to be transferred to [facility] ICU once intubation complete.

## 2015-01-23 NOTE — Progress Notes (Signed)
[place] spoke with mother after conferring with AC to remove pt from DNR and establish partial code: No compressions.  Message relayed to Charge RN.  8:20 PM Gwynn Burly

## 2015-01-23 NOTE — Progress Notes (Signed)
Attempted Art-Line: I tried twice to attempt art-line unsuccessful, Another Therapist Broadus John, RRT tried only blood flash, and my Charge Therapist tired again unsuccessful. Dr. Milinda Hirschfeld notified of the unsuccessful attempts to try to get Art-line. Pt was stable throughout no complications noted. MD notified and stated that he would try to take a look at it. RT will continue to monitor pt tonight.

## 2015-01-23 NOTE — Progress Notes (Signed)
Pt intubated and transferred to 3M13.  Report given bedside to [facility] RN.

## 2015-01-23 NOTE — Progress Notes (Signed)
Patient's father insisted that patient's feeding be restarted. Paged MD and was told to start feeding per father's request.

## 2015-01-23 NOTE — Progress Notes (Addendum)
Pt family demanding pt to be put on Bipap due to decreasing oxygen SATs.  Pt no longer has Bipap orders and is currently on NRB.  This RN spoke on the phone to pt mother, who is HCPOA, and she demanded that pt code status be changed from DNR to partial code with intubation only, no chest compressions.  Mother also demanded that pt be intubated, a Left PICC to be placed, and TPN started in his  port.  Charge RN notified and involved, AC contacted to inform of Code Status change.  Chaplain spoke with mother, HCPOA, on the phone to inform him of new code status.  This RN then contacted [facility] on call to inform of events and HCPOA decision to change DNR status. New orders given  RTT and pt may need to be intubated as O2 sats are dropping.  Will continue to monitor

## 2015-01-23 NOTE — Progress Notes (Signed)
[place] with MD and family as MD and RN discussed pt care; family still unrealistic and hoping for miracle and requesting medicine that MD feels ethical conflict; [place] tried to console family and aid in MD discussion along with family and RT regarding pt care; [place] provided moral and spiritual support to family and staff regarding pt care and pt current medical situation; [place] offered grief support and prayer for family and pt; [place] will continue to monitor and aid in staff and family support as needed in this difficult and challenging situation; [place] observed medical team being responsive within ethical guidelines for pt care in this case. Gwynn Burly 6:05 PM

## 2015-01-23 NOTE — Progress Notes (Signed)
CXR shows ETT 1.5cm above the carina. Spoke to MD Nester in the regard of ETT placement. MD stated to leave ETT where it currently is. RT will continue to monitor pt status.

## 2015-01-23 NOTE — Progress Notes (Signed)
Daily Progress Note   Patient Name: Susumu Hackler       Date: 01/23/2015 DOB: 1990-09-03  Age: 24 y.o. MRN#: [id number] Attending Physician: Charlynne Cousins, MD Primary Care Physician: Marijean Bravo, MD Admit Date: 01/05/2015  Reason for Consultation/Follow-up: Establishing goals of care, Non pain symptom management and Psychosocial/spiritual support  Subjective: Met with patient and [name] in his room and spoke with his guardian, [name], today via phone.  His family continues to express displeasure with many aspects of his care and states that they are wanting to work on plan to have him discharged home as soon as possible.  See Goals of Care below for further details.  Length of Stay: 11 days  Current Medications: Scheduled Meds:  . acetaminophen  650 mg Oral Once  . alosetron  1 mg Oral BID  . antiseptic oral rinse  7 mL Mouth Rinse q12n4p  . belladonna-PHENObarbital  10 mL Per Tube TID  . chlorhexidine  15 mL Mouth Rinse BID  . cholestyramine  4 g Per Tube TID  . cloBAZam  3.75 mg Per Tube TID AC  . [facility] Beanaid  2 capsule Per J Tube TID  . diphenhydrAMINE  12.5 mg Intravenous Once  . diphenoxylate-atropine  10 mL Per Tube QID  . feeding supplement (PRO-STAT SUGAR FREE 64)  30 mL Per Tube TID  . filgrastim  300 mcg Subcutaneous Once  . folic acid  1 mg Oral Daily  . free water  180 mL Per Tube 3 times per day  . gabapentin  900 mg Per Tube 3 times per day  . levalbuterol  0.63 mg Nebulization Q6H  . pantoprazole sodium  40 mg Per Tube BID  . potassium chloride  40 mEq Per Tube Daily  . saccharomyces boulardii  250 mg Oral BID  . sodium chloride  3 mL Intravenous Q12H  . tigecycline (TYGACIL) IVPB  50 mg Intravenous Q12H    Continuous Infusions: . baclofen     . TWOCAL HN Stopped (01/23/15 1340)    PRN Meds: acetaminophen (TYLENOL) oral liquid 160 mg/5 mL **OR** acetaminophen, dextrose, dicyclomine, ibuprofen, ketoconazole, levalbuterol, LORazepam, morphine injection, zinc oxide  Physical Exam: Physical Exam  Constitutional:  On bipap  Cardiovascular: Normal rate.   No murmur heard. Pulmonary/Chest:  Shallow, diffuse rhonchi  Abdominal: He exhibits distension.  GJ tube in place  Musculoskeletal:  Contractures noted  Skin: Skin is warm and dry.                Vital Signs: BP 141/15 mmHg  Pulse 107  Temp(Src) 98.7 F (37.1 C) (Oral)  Resp 33  Ht 4' 11"  (1.499 m)  Wt 58.8 kg (129 lb 10.1 oz)  BMI 26.17 kg/m2  SpO2 77% SpO2: SpO2: (!) 77 % O2 Device: O2 Device: NRB O2 Flow Rate: O2 Flow Rate (L/min): 4 L/min  Intake/output summary:   Intake/Output Summary (Last 24 hours) at 01/23/15 1853 Last data filed at 01/23/15 1540  Gross per 24 hour  Intake   1510 ml  Output   1975 ml  Net   -465 ml   LBM: Last BM Date: 01/23/15 Baseline Weight: Weight: 58.8 kg (129 lb 10.1 oz) Most recent weight: Weight: 58.8 kg (129 lb 10.1 oz)       Palliative Assessment/Data: Flowsheet Rows        Most Recent Value   Intake Tab    Referral Department  Hospitalist   Unit at Time of Referral  Cardiac/Telemetry Unit   Palliative Care Primary Diagnosis  Sepsis/Infectious Disease   Date Notified  01/29/2015   Palliative Care Type  New Palliative care   Reason for referral  Clarify Goals of Care   Date of Admission  01/20/2015   Date first seen by Palliative Care  01/21/15   # of days Palliative referral response time  2 Day(s)   # of days IP prior to Palliative referral  0   Clinical Assessment    Palliative Performance Scale Score  10%   Psychosocial & Spiritual Assessment    Palliative Care Outcomes    Patient/Family meeting held?  Yes   Who was at the meeting?  father    Palliative Care Outcomes  Improved non-pain symptom therapy    Palliative Care follow-up planned  Yes, Facility      Additional Data Reviewed: CBC    Component Value Date/Time   WBC 4.3 01/23/2015 0409   WBC 5.7 11/08/2014 0827   RBC 3.02* 01/23/2015 0409   RBC 2.18* 12/31/2014 1450   RBC 2.74* 11/08/2014 0827   HGB 9.3* 01/23/2015 0409   HGB 8.9* 11/08/2014 0827   HCT 30.7* 01/23/2015 0409   HCT 30.0* 11/08/2014 0827   PLT 62* 01/23/2015 0409   PLT 115* 11/08/2014 0827   MCV 101.7* 01/23/2015 0409   MCV 110* 11/08/2014 0827   MCH 30.8 01/23/2015 0409   MCH 32.5 11/08/2014 0827   MCHC 30.3 01/23/2015 0409   MCHC 29.7* 11/08/2014 0827   RDW 19.7* 01/23/2015 0409   RDW 20.2* 11/08/2014 0827   LYMPHSABS 0.1* 01/23/2015 0409   LYMPHSABS 0.4* 11/08/2014 0827   MONOABS 0.6 01/23/2015 0409   EOSABS 0.0 01/23/2015 0409   EOSABS 0.1 11/08/2014 0827   BASOSABS 0.0 01/23/2015 0409   BASOSABS 0.0 11/08/2014 0827    CMP     Component Value Date/Time   NA 140 01/23/2015 1028   NA 138 11/08/2014 0828   K 4.3 01/23/2015 1028   K 4.1 11/08/2014 0828   CL 110 01/23/2015 1028   CL 101 11/08/2014 0828   CO2 16* 01/23/2015 1028   CO2 30 11/08/2014 0828   GLUCOSE 93 01/23/2015 1028   GLUCOSE 97 11/08/2014 0828   BUN 24* 01/23/2015 1028   BUN 16 11/08/2014 0828   CREATININE 1.21 01/23/2015  1028   CREATININE 0.6 11/08/2014 0828   CALCIUM 9.2 01/23/2015 1028   CALCIUM 9.7 11/08/2014 0828   PROT 4.5* 01/23/2015 0409   PROT 6.2* 11/08/2014 0828   ALBUMIN 1.6* 01/23/2015 0409   ALBUMIN 3.1* 11/08/2014 0828   AST 13* 01/23/2015 0409   AST 40* 11/08/2014 0828   ALT 10* 01/23/2015 0409   ALT 69* 11/08/2014 0828   ALKPHOS 333* 01/23/2015 0409   ALKPHOS 147* 11/08/2014 0828   BILITOT 0.4 01/23/2015 0409   BILITOT 0.50 11/08/2014 0828   GFRNONAA >60 01/23/2015 1028   GFRAA >60 01/23/2015 1028       Problem List:  Patient Active Problem List   Diagnosis Date Noted  . Goals of care, counseling/discussion   . Increased oropharyngeal  secretions   . Encounter for palliative care   . Enterobacter cloacae pneumonia ([place])   . Diarrhea   . Abdominal distension, gaseous   . Septic shock ([place])   . Osteomyelitis, pelvic region and thigh ([place])   . PNA (pneumonia)   . Hypernatremia 01/28/2015  . AKI (acute kidney injury) ([place]) 01/11/2015  . Leukopenia   . Protein-calorie malnutrition, severe ([place])   . Elevated liver enzymes   . Osteomyelitis ([place])   . Pressure ulcer 01/01/2015  . Chronic leukopenia   . Candidal UTI (urinary tract infection)   . Hypothermia   . Hyperthermia   . Pancytopenia ([place])   . Acute respiratory failure with hypoxia ([place])   . Lactic acidosis   . Seizures ([place])   . 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 Hypokalemia   . Aspiration pneumonia due to food (regurgitated) ([place])   . Cerebral palsy ([place])   . Convulsion ([place])   . Functional quadriplegia (H[facility])   . Other pancytopenia ([place])   . Severe sepsis ([place]) 10/09/2014  . HCAP (healthcare-associated pneumonia) 10/09/2014  . Sinus tachycardia ([place]) 10/09/2014  . Elevated LFTs 10/09/2014  . Transaminitis 10/09/2014  . Encounter for gastrojejunal ([place]) tube placement   . Palliative care encounter   . Abdominal distention   . Erythema, possible cellulitis 07/21/2014  . Enteritis 07/21/2014  . Sepsis ([place]) 07/20/2014  . Sepsis due to pneumonia ([place]) 07/13/2014  . Anemia of chronic disease 07/13/2014  . Elevated lactic acid level 07/13/2014  . Fever 07/13/2014  . Hyperglycemia, drug-induced 07/13/2014  . Acquired pancytopenia ([place])   . Cerebral palsy, quadriplegic ([place])   . OSA (obstructive sleep apnea)   . Acute on chronic respiratory failure with hypoxia ([place])   . Aspiration pneumonia ([place]) 11/25/2012  . Dysphagia requring chronic J tube feeds   . Neuromuscular scoliosis   . 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 May use oxygen, suction and manual treatment of airway obstruction as needed for comfort.      01/10/2015 2233    Advance Directive Documentation  Most Recent Value   Type of Advance Directive  Living will   Pre-existing out of facility DNR order (yellow form or pink MOST form)     "MOST" Form in Place?         2. Goals of Care/Additional Recommendations:  Met with [name] in the room and he referred me to patient's guardian, [name], whom I spoke with on the phone. She remains unsatisfied with the care that [name] has received and relays story that she is very upset as she did not want [name] to be cared for by the nurse that was assigned to him last evening.  She reports that her new goal is to get him out of the hospital and she wants to bring him home tomorrow with hospice support. I discussed her goals for his care after transition home and she states that her plan is not to transition home with focus on comfort, but she wants to continue with aggressive therapies such as IV antibiotics and noted again her hope that he can be started on TPN.  See Discharge Planning section of note below.  His guardian, [name], is concerned that "he isn't getting care he should because people have given up on him." A the same time, I am concerned that we are prolonging the dying process rather than providing care with realistic possibility of [name] clinically improving.  I placed an ethics consult yesterday and have been trying to ascertain if all providers involved in his care feel that this would fall under scope of  medically futile care as this was recommended first step by ethics team.   Limitations on Scope of Treatment: Patient is DNR/DNI. Continue full medical care up to point of respiratory or cardiac arrest.  Desire for further Chaplaincy support: "I only want Pat"  Psycho-social Needs: Caregiving  Support/Resources, Education on Hospice and Grief/Bereavement Support  3. Symptom Management: Dyspnea: Morphine on as needed basis.  His family has been noted to be refusing this at times, but he has received multiple doses today. I've encouraged his family that treatment of dyspnea with opioids would be standard of care in this setting and this is something that should be continued  4. Palliative Prophylaxis:   Aspiration  5. Prognosis: Hours to days.  6. Discharge Planning:  To be determined.  There is a high likelihood that this will be a terminal admission.  His guardian would like to pursue discharge home tomorrow with hospice support through [place].  She reports that she wants to continue with aggressive interventions such as IV antibiotics and TPN once he is discharged home. I told her that these types of interventions will likely not be covered within the scope of hospice care. She reports that [name]'s PCP has spoken with [facility] and is going to serve as his hospice attending.  She reports her main goal is to have hospice support on discharge and have [place] follow-up with Dr. Jobe Igo next week for evaluation.  She believes after talking to Dr. Ronnell [place] office that hospice is familiar with his care needs and will able to meet these requests.   I spoke with Levada Dy (case manager), and she will place referral to [facility] to review his case.  I have reservations regarding their willingness to accept him as a patient based upon stated goals and desire for continuation of IV antibiotics and initiation of TPN.  Care plan was discussed with patient's guardians,  Dr. Aileen Fass, beside  nurse  Thank you for allowing the Palliative Medicine Team to assist in the care of this patient.   Time In: 1572 Time Out: 1600 Total Time 45 Prolonged Time Billed  No        Micheline Rough, MD  01/23/2015, 6:53 PM  Please contact Palliative Medicine Team phone at [phone] for questions and concerns.

## 2015-01-23 NOTE — Progress Notes (Addendum)
[facility] PROGRESS NOTE    Progress Note   Jeffery [name] [id number] DOB: Oct 04, 1990 DOA: 01/09/2015 PCP: Marijean Bravo, MD   Brief Narrative:   Jeffery [name] is an 24 y.o. 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 bedbound, seizure disorder, pancytopenia requiring frequent transfusions as well as Neupogen infection, chronic respiratory failure on oxygen at home and BiPAP, with a severe dysphagia and a J-tube recently placed, diagnosed with sacral osteomyelitis and discharged home on IV Invanz on 01/07/2015 and readmitted on 01/30/2015 with severe sepsis likely secondary from aspiration pneumonia, with a chest x-ray done on admission showed bibasilar infiltrates, a repeated chest x-ray on 01/21/2015 show worsening left lower lobe infiltrate, profoundly hypernatremic, admitted and started on broad-spectrum antibiotics, infectious disease was consulted, unfortunately Hospital course has been complicated by development of repeated episode of aspiration pneumonia leading to respiratory distress requiring BiPAP. Now with a worsening persistent diarrhea leading to hyponatremia.  Assessment/Plan:   Severe sepsis (Jeffery [name]) likely due to aspiration pneumonia: He has remained afebrile for a few days. He had been initially on ceftaroline/flagyl, now on Tigecycline do to worsening diarrhea started on 01/21/2015. Stop date on empiric antibiotics is 4 02/10/2014  Acute on chronic respiratory failure with hypoxia (HCC) likely due to Aspiration PNA/ Enterobacter cloacae pneumonia [facility]): He continues to develop repeated episodes of acute hypoxic respiratory failure likely due to aspiration. A chest x-ray done on 01/21/2015 show significantly worsened left lower lobe infiltrate. The family seems does not grasp the seriousness of the situation. With markers pointing to malnutrition and worsening overall picture and these conditioning are  likely to further exacerbate aspiration episodes. Palliative the spoken with family and they are concerned that his care seems to be prolonging his dying process. He has received IV morphine and Ativan. He has been on continuous Jeffery [name], suctioning, vibrator vest and throat dilators. With minimal improvement in his saturations. Unfortunately this situation will lead to no good outcomes and this has been expressed by Dr. Sloan Leiter, Jeffery [name] and Jeffery [name]. As reading the chart the mother seems to be dictating care of the patient that in my opinion seems to be ineffective. I don't pretend to understand what his parents are going through. But I I am concerned that all these measures will eventually lead to unsatisfying outcome for Jeffery [name]. I had a long conversation with Jeffery [name]'s mother and father the mother keeps blaming everybody in the hospital; physcian, nurse and other staff for her son's deterioration and poor progression of his disease. I think she has unrealistic expectation about the outcomes. Her husband and the wife was talking to me in a demanding tone and demanding the patient to be discharged today. He is on BiPAP and FiO2 50 satting 95%, if we cannot get him to a nasal cannula I'll feel uncomfortable transporting him home. He is third spacing, with a low albumin, with a worsening creatinine after IV Lasix, I think his saturations will hold on nasal cannula. The family does not want to take him home with goals of moving towards comfort measures, I do not think he is medically stable to be discharged from the hospital today. The family informed me that his PCP recommended for him to be discharged home. He is not able to protect his airways at this time, at this point Bipap is contraindicated. He was given a dose of IV lasix risk and benefits were explain to the family and they understand and they  will like to proceed. To see if it would help with respiratory status.   Diarrhea: C. difficile PCR negative  has failed numerous combination of medication like Imodium, Lomotil 2 and Questran with minimal improvement. Unsure if this is likely due to antibiotics or malnutrition or both.  Hyponatremia: Subsequent likely due to hypovolemia from sepsis. It has improved with IV hydration.  Hypokalemia: Resolved likely due to diarrhea.  Acute kidney injury: Initially he was likely due to hypovolemia, which improved with IV hydration. Which is now worsened since yesterday. Basic metabolic panel is pending at this time.  Transaminitis: Right upper quadrant ultrasound was negative for hepatobiliary disease. Quantity to the Jeffery [name] sepsis now resolved.  Acquired pancytopenia: Likely due to Jeffery [name] and other bone marrow suppressing medication. Oncology was consulted and appreciate assistance is required transfusions of packed red blood cells, and oncology recommended Neupogen.    History of Seizures (Jeffery [name]): Continue Onfi and Neurontin.  Sacral decubitus  Pressure ulcer/  Osteomyelitis, pelvic region and thigh (HCC)/with osteomyelitis:  Chronic prior to admission wound care following.  Severe protein caloric Malnutrition (Jeffery [name]) He's having malabsorption leading to diarrhea Likely This Will Improve with Feeding.  Increased oropharyngeal secretions Leading to aspiration likely due to deconditioning.  Encounter for palliative care/ Goals of care, counseling/discussion 24 year old with cerebral palsy with spastic quadriplegia bedbound with a sacral decubitus ulcer, severe protein caloric malnutrition, pancytopenia acute renal failure and acute hypoxic respiratory failure on BiPAP and if FiO2 of 50%, with ongoing diarrhea, and with recently resolved septic shock on empiric antibiotics for aspiration pneumonia and Enterobacter pneumonia. More than one physician have deemed that it was futile to continue treatment. After reviewing thoroughly has chart I agree that it would be futile to continue treatment.  Family does not seem to grasp the severity of the situation. I spent more than 30 minutes talking to the husband and wife about treatment and prognosis. I'll try again this afternoon.    DVT Prophylaxis - Lovenox ordered.  Family Communication: father and mother Disposition Plan: Home when stable. Code Status:     Code Status Orders        Start     Ordered   01/03/2015 2234  Do not attempt resuscitation (DNR)   Continuous    Question Answer Comment  In the event of cardiac or respiratory ARREST Do not call a "code blue"   In the event of cardiac or respiratory ARREST Do not perform Intubation, CPR, defibrillation or ACLS   In the event of cardiac or respiratory ARREST Use medication by any route, position, wound care, and other measures to relive pain and suffering. May use oxygen, suction and manual treatment of airway obstruction as needed for comfort.      01/23/2015 2233    Advance Directive Documentation        Most Recent Value   Type of Advance Directive  Living will   Pre-existing out of facility DNR order (yellow form or pink MOST form)     "MOST" Form in Place?          IV Access:    Peripheral IV   Procedures and diagnostic studies:   Dg Chest Port 1 View  Jan 31, 2015  CLINICAL DATA:  Leukopenia. EXAM: PORTABLE CHEST 1 VIEW COMPARISON:  01/16/2015 FINDINGS: Right-sided Port-A-Cath terminates over the lower SVC, unchanged. Cardiomediastinal silhouette is unchanged. The lungs remain hypoinflated. There is mild right basilar opacity which has improved. Left basilar greater than mid lung opacities have worsened. No large  pleural effusion or definite pneumothorax is identified. Spinal fusion hardware is noted. IMPRESSION: Persistent hypoinflation with improved aeration of the right lung base. Worsening left lung aeration concerning for pneumonia. Electronically Signed   By: Logan Bores M.D.   On: 01/21/2015 15:02     Medical Consultants:     None.  Anti-Infectives:   Anti-infectives    Start     Dose/Rate Route Frequency Ordered Stop   01/22/15 0400  tigecycline (TYGACIL) 50 mg in sodium chloride 0.9 % 100 mL IVPB     50 mg 200 mL/hr over 30 Minutes Intravenous Every 12 hours 01/21/15 1526     01/21/15 1600  tigecycline (TYGACIL) 100 mg in sodium chloride 0.9 % 100 mL IVPB     100 mg 200 mL/hr over 30 Minutes Intravenous  Once 01/21/15 1526 01/21/15 1630   01/20/15 1800  ceftaroline (TEFLARO) 600 mg in sodium chloride 0.9 % 250 mL IVPB  Status:  Discontinued     600 mg 250 mL/hr over 60 Minutes Intravenous Every 12 hours 01/20/15 1405 01/21/15 1526   01/14/15 1200  cefTAZidime (FORTAZ) 2 g in dextrose 5 % 50 mL IVPB  Status:  Discontinued     2 g 100 mL/hr over 30 Minutes Intravenous Every 24 hours 01/13/15 1344 01/13/15 1553   01/13/15 1800  ceftaroline (TEFLARO) 300 mg in sodium chloride 0.9 % 250 mL IVPB  Status:  Discontinued     300 mg 250 mL/hr over 60 Minutes Intravenous Every 12 hours 01/13/15 1558 01/20/15 1405   01/13/15 1700  ceftaroline (TEFLARO) 300 mg in sodium chloride 0.9 % 250 mL IVPB  Status:  Discontinued     300 mg 250 mL/hr over 60 Minutes Intravenous Every 12 hours 01/13/15 1557 01/13/15 1558   01/13/15 1600  metroNIDAZOLE (FLAGYL) IVPB 500 mg  Status:  Discontinued     500 mg 100 mL/hr over 60 Minutes Intravenous Every 8 hours 01/13/15 1554 01/21/15 1526   01/13/15 0845  linezolid (ZYVOX) IVPB 600 mg  Status:  Discontinued     600 mg 300 mL/hr over 60 Minutes Intravenous Every 12 hours 01/31/2015 2345 01/23/2015 2350   01/08/2015 2200  linezolid (ZYVOX) IVPB 600 mg  Status:  Discontinued     600 mg 300 mL/hr over 60 Minutes Intravenous Every 12 hours 01/19/2015 2020 01/13/15 1532   01/25/2015 2200  cefTAZidime (FORTAZ) 2 g in dextrose 5 % 50 mL IVPB  Status:  Discontinued     2 g 100 mL/hr over 30 Minutes Intravenous Every 12 hours 01/20/2015 2034 01/13/15 1344      Subjective:    Jeffery Ina Jeffery [name]  [name] lethargic and unable to respond.  Objective:    Filed Vitals:   01/23/15 0000 01/23/15 0139 01/23/15 0359 01/23/15 0400  BP: 107/44   92/46  Pulse: 108  101 105  Temp: 98.9 F (37.2 C)   98.4 F (36.9 C)  TempSrc: Rectal   Rectal  Resp: 28  26 27   Height:      Weight:      SpO2: 95% 96% 95% 94%    Intake/Output Summary (Last 24 hours) at 01/23/15 0754 Last data filed at 01/23/15 0600  Gross per 24 hour  Intake 3083.33 ml  Output   1875 ml  Net 1208.33 ml   Filed Weights   01/09/2015 2341  Weight: 58.8 kg (129 lb 10.1 oz)    Exam: Gen:  NAD Cardiovascular:  RRR. Chest and lungs:   Diffuse crackles bilaterally  in the lower lobes more on the left than on the right Abdomen:  Abdomen soft, NT/ND, + BS Extremities: 3+ edema he has anasarca   Data Reviewed:    Labs: Basic Metabolic Panel:  Recent Labs Lab 01/17/15 0400  01/19/15 0310 01/20/15 0420 01/21/15 0435 01/22/15 0500 01/23/15 0409  NA 155*  < > 152* 148* 148* 143 139  K 3.0*  < > 3.6 4.7 4.1 4.1 4.2  CL 125*  < > 124* 118* 118* 110 109  CO2 20*  < > 19* 18* 17* 16* 15*  GLUCOSE 107*  < > 80 179* 103* 87 93  BUN 30*  < > 23* 20 20 15  22*  CREATININE 1.47*  < > 1.21 1.18 1.05 1.13 1.36*  CALCIUM 8.1*  < > 8.2* 8.3* 8.6* 9.0 9.1  MG 1.6*  --   --  1.3*  --   --  2.0  < > = values in this interval not displayed. GFR Estimated Creatinine Clearance: 61.7 mL/min (by C-G formula based on Cr of 1.36). Liver Function Tests:  Recent Labs Lab 01/18/15 0330 01/19/15 0310 01/22/15 0500 01/23/15 0409  AST 21 19 14* 13*  ALT 26 20 12* 10*  ALKPHOS 494* 469* 437* 333*  BILITOT 0.5 0.4 0.2* 0.4  PROT 4.3* 4.0* 4.9* 4.5*  ALBUMIN 1.5* 1.5* 1.7* 1.6*   No results for input(s): LIPASE, AMYLASE in the last 168 hours. No results for input(s): AMMONIA in the last 168 hours. Coagulation profile No results for input(s): INR, PROTIME in the last 168 hours.  CBC:  Recent Labs Lab 01/19/15 0310  01/20/15 0420 01/21/15 0435 01/22/15 0500 01/23/15 0409  WBC 4.2 3.6* 2.8* 9.4 4.3  NEUTROABS 3.3 2.7 2.0 8.0* 3.6  HGB 9.4* 9.2* 10.4* 10.3* 9.3*  HCT 30.9* 31.1* 35.0* 35.7* 30.7*  MCV 100.3* 102.0* 103.6* 104.1* 101.7*  PLT 72* 85* 97* 87* 62*   Cardiac Enzymes: No results for input(s): CKTOTAL, CKMB, CKMBINDEX, TROPONINI in the last 168 hours. BNP (last 3 results) No results for input(s): PROBNP in the last 8760 hours. CBG:  Recent Labs Lab 01/22/15 1122 01/22/15 1441 01/22/15 2116 01/23/15 0013 01/23/15 0606  GLUCAP 97 100* 100* 101* 85   D-Dimer: No results for input(s): DDIMER in the last 72 hours. Hgb A1c: No results for input(s): HGBA1C in the last 72 hours. Lipid Profile: No results for input(s): CHOL, HDL, LDLCALC, TRIG, CHOLHDL, LDLDIRECT in the last 72 hours. Thyroid function studies: No results for input(s): TSH, T4TOTAL, T3FREE, THYROIDAB in the last 72 hours.  Invalid input(s): FREET3 Anemia work up: No results for input(s): VITAMINB12, FOLATE, FERRITIN, TIBC, IRON, RETICCTPCT in the last 72 hours. Sepsis Labs:  Recent Labs Lab 01/20/15 0420 01/21/15 0435 01/22/15 0500 01/23/15 0409  WBC 3.6* 2.8* 9.4 4.3   Microbiology Recent Results (from the past 240 hour(s))  Urine culture     Status: None   Collection Time: 01/16/15  4:15 PM  Result Value Ref Range Status   Specimen Description URINE, CATHETERIZED  Final   Special Requests NONE  Final   Culture NO GROWTH 1 DAY  Final   Report Status 01/17/2015 FINAL  Final  Culture, respiratory (NON-Expectorated)     Status: None   Collection Time: 01/17/15 11:55 AM  Result Value Ref Range Status   Specimen Description ASPIRATE  Final   Special Requests NASAL ASPIRATE Immunocompromised  Final   Gram Stain   Final    NO WBC SEEN ABUNDANT SQUAMOUS  EPITHELIAL CELLS PRESENT RARE GRAM NEGATIVE RODS Performed at [facility]    Culture   Final    MODERATE ENTEROBACTER AEROGENES Performed at  [facility]    Report Status 01/21/2015 FINAL  Final   Organism ID, Bacteria ENTEROBACTER AEROGENES  Final      Susceptibility   Enterobacter aerogenes - MIC*    CEFAZOLIN >=64 RESISTANT Resistant     CEFEPIME <=1 SENSITIVE Sensitive     CEFTAZIDIME >=64 RESISTANT Resistant     CEFTRIAXONE >=64 RESISTANT Resistant     CIPROFLOXACIN <=0.25 SENSITIVE Sensitive     GENTAMICIN <=1 SENSITIVE Sensitive     IMIPENEM <=0.25 SENSITIVE Sensitive     PIP/TAZO >=128 RESISTANT Resistant     TOBRAMYCIN <=1 SENSITIVE Sensitive     TRIMETH/SULFA Value in next row Sensitive      <=20 SENSITIVE(NOTE)    AMIKACIN Value in next row Sensitive      <=20 SENSITIVE(NOTE)    * MODERATE ENTEROBACTER AEROGENES  C difficile quick scan w PCR reflex     Status: None   Collection Time: 01/19/15  7:40 AM  Result Value Ref Range Status   C Diff antigen NEGATIVE NEGATIVE Final   C Diff toxin NEGATIVE NEGATIVE Final   C Diff interpretation Negative for toxigenic C. difficile  Final  Gastrointestinal Panel by PCR , Stool     Status: None   Collection Time: 01/21/15  1:30 PM  Result Value Ref Range Status   Campylobacter species NOT DETECTED NOT DETECTED Final   Plesimonas shigelloides NOT DETECTED NOT DETECTED Final   Salmonella species NOT DETECTED NOT DETECTED Final   Yersinia enterocolitica NOT DETECTED NOT DETECTED Final   Vibrio species NOT DETECTED NOT DETECTED Final   Vibrio cholerae NOT DETECTED NOT DETECTED Final   Enteroaggregative E coli (EAEC) NOT DETECTED NOT DETECTED Final   Enteropathogenic E coli (EPEC) NOT DETECTED NOT DETECTED Final   Enterotoxigenic E coli (ETEC) NOT DETECTED NOT DETECTED Final   Shiga like toxin producing E coli (STEC) NOT DETECTED NOT DETECTED Final   E. coli O157 NOT DETECTED NOT DETECTED Final   Shigella/Enteroinvasive E coli (EIEC) NOT DETECTED NOT DETECTED Final   Cryptosporidium NOT DETECTED NOT DETECTED Final   Cyclospora cayetanensis NOT DETECTED NOT  DETECTED Final   Entamoeba histolytica NOT DETECTED NOT DETECTED Final   Giardia lamblia NOT DETECTED NOT DETECTED Final   Adenovirus F40/41 NOT DETECTED NOT DETECTED Final   Astrovirus NOT DETECTED NOT DETECTED Final   Norovirus GI/GII NOT DETECTED NOT DETECTED Final   Rotavirus A NOT DETECTED NOT DETECTED Final   Sapovirus (I, II, IV, and V) NOT DETECTED NOT DETECTED Final     Medications:   . acetaminophen  650 mg Oral Once  . alosetron  1 mg Oral BID  . antiseptic oral rinse  7 mL Mouth Rinse q12n4p  . belladonna-PHENObarbital  10 mL Per Tube TID  . chlorhexidine  15 mL Mouth Rinse BID  . cholestyramine  4 g Per Tube TID  . cloBAZam  3.75 mg Per Tube TID AC  . [facility] Beanaid  2 capsule Per J Tube TID  . diphenhydrAMINE  12.5 mg Intravenous Once  . diphenoxylate-atropine  10 mL Per Tube QID  . feeding supplement (PRO-STAT SUGAR FREE 64)  30 mL Per Tube TID  . filgrastim  300 mcg Subcutaneous Once  . folic acid  1 mg Oral Daily  . free water  180 mL Per Tube  3 times per day  . gabapentin  900 mg Per Tube 3 times per day  . levalbuterol  0.63 mg Nebulization Q6H  . loperamide  2 mg Per Tube Q6H  . pantoprazole sodium  40 mg Per Tube BID  . potassium chloride  40 mEq Per Tube Daily  . saccharomyces boulardii  250 mg Oral BID  . sodium chloride  3 mL Intravenous Q12H  . tigecycline (TYGACIL) IVPB  50 mg Intravenous Q12H   Continuous Infusions: . baclofen    . dextrose 5 % 1,000 mL with potassium chloride 20 mEq infusion 75 mL/hr at 01/23/15 0200  . TWOCAL HN 237 mL (01/17/15 1000)    Time spent: 35 min.  Start a conversation with the father and mother about 8:05-8:50 am  LOS: 11 days   Charlynne Cousins  [facility] Pager [phone]  *Please refer to [email or web address], password [email or web address] to get updated schedule on who will round on this patient, as hospitalists switch teams weekly. If 7PM-7AM, please contact night-coverage at [email or web address], password [email or web address] for any  overnight needs.  01/23/2015, 7:54 AM

## 2015-01-23 NOTE — Progress Notes (Signed)
MD Nester bedside

## 2015-01-23 NOTE — Progress Notes (Signed)
Jeffery [name] is relatively stable this morning. He may look a little bit better. He still is on some BiPAP. His oxygen saturation is doing well.  His diarrhea seems to slow down quite nicely.  I spoke to his father this morning. I also spoke to his mom on the phone. They really would like to take him home. I understand that there have been some issues with respect to his care. His parents feel that he will get the care that he needs at home. I certainly have no problems with this. His parents are incredibly attentive and have built their lives around around caring for him. They have a very good family doctor who really helps out quite a lot with his care at home.  They are concerned about his lack of nutrition. They would like to feed him through the feeding tube. They realize that the diarrhea may worsen. If diarrhea worsens, then possibly the hypernatremia will come back.  He has been afebrile. His temperature is 98.4. His pulse is 105. Blood pressure 92/46. His lungs show good breath sounds bilaterally. Cardiac exam tachycardic but regular. He has no murmurs, rubs or bruits. Abdomen is not as distended. Abdomen is actually fairly soft. He has decent bowel sounds. There is no obvious guarding or rebound tenderness. Extremities shows some trace edema in his extremities.  On his labs, his white count 4.3. Hemoglobin 9.3. Platelet count is 62,000. His sodium is 139. Potassium 4.2. BUN 22 and creatinine 1.36. His albumin is 1.6.  I was a little encouraged yesterday by his prealbumin. I really thought his prealbumin would be less than 5. At 14.8, this shows that there is still some nutritional reserve that Jeffery [name] might be able to utilize in order to try to get better.  This is been a very difficult hospitalization for him. He is an incredibly, complicated patient to care for. He has multiple chronic issues that typically, and a play when he is admitted. This osteomyelitis is going be very difficult to treat. He  developed pneumonia. The respiratory cultures are growing Enterobacter. This can be incredibly difficult to treat. Thankfully, it is not one of the "super bugs" that are carbapenem resistant. He definitely will need antibiotics at home.  I realize that the staff has been a tremendous job in trying to care for him and following hospital policy.  I also realize that his parents are incredibly dedicated to his care and are very particular as to what is best for him. Sometimes there can be a "divergence" of opinion with how he should be cared for.  Again, I know that his parents will do a great job in trying to care for him at home. They have a wonderful family doctor who has always been supportive with careful Jeffery [name] at home.  I will go ahead and give him a dose of Neupogen in anticipating that he goes home. This will get his white cell count up a little bit better.  As always, we had a very good prayer session before I left. I know that the good Jeffery [name] will continue to fight the battle for Jeffery [name].  Jeffery E.  Jeffery [name] 1:9

## 2015-01-23 NOTE — Ethics Note (Signed)
[facility] Ethics Consult Intake Form  Name of Ethics Committee member: Jackqulyn Livings Date/Time: 1700 on 01/22/15 Member taking information:  JAARS  Name of person making initial contact: Gene Domingo Cocking Relationship/Role in patient's care: palliative care service   Name, location of patient and MR#: Minh, [place], MR [id number]  Has palliative care consult been obtained/requested? yes  Brief description of clinical/ethical question; include time constraints if clinically necessary:   24 y/o with mult medical problem with mult admissions.  H/o CP with recurrent infections.  Currently day #9 with this admission.  Call from [facility] asking if futility policy would apply to patient.  Per Domingo Cocking, pt's adoptive mother is the main caregiver and patient cannot make decisions.  Reportedly patient is DNR.    D/w him that at this point, would need agreement from all docs that aggressive care would be futile before going any further with policy application.  He will d/w with primary team, along with consulting teams.  Should all agree that aggressive care would be futile, then they would need to exhaust conversation with the patient's adoptive mother.  Should they get to that point and still be at an impasse, then we could consult with the pt's adoptive mother and the involved teams.  He'll update me as needed and I'll await return call.    Update- call from Patricia Pesa, [facility] Coordinator at 2130 on 01/22/15.  Per Gillian Scarce, the pt's adoptive mother/caregiver had been increasingly demanding and reporting information back to staff that was possibly incorrect.  Staff was caring for the patient and interacting with his adoptive mother in teams of two, to provide a witness to the care and interaction.  Adoptive mother reportedly asked about checking patient out AMA.  Liles asked for advice.  I advised her to page the primary team.  It will be up to the primary team to consider discussion  with SW and/or risk management.  If there is reason to believe the patient would be at high risk for harm if checked out AMA, then it may be reasonable to try to prevent the AMA exit from the hospital.    I'll await update from inpatient team.  Available by pager [phone].

## 2015-01-23 NOTE — Progress Notes (Signed)
Patient was given PRN morphine and scheduled medications. Pt's father had turned off Kangaroo pump and reported that pt was not tolerating feeding. Feeding tube flushed. Pt's respirations are still in the upper 30's and lower 40's even after receiving morphine. MD was notified. Will continue to monitor and give support to pt and family.

## 2015-01-23 NOTE — Progress Notes (Signed)
RN called RT to patient room due to patient's father wishing for patient to be NTS.  Patient oxygen saturation was reading 84% upon arrival to patient room.  Suctioned out small amount of tan, thick secretions.  Patient sats still did not improve and patient still had a respiratory rate in the 40s.  Placed patient on Bipap and increased FIO2 to 100%.  Patient sats improved to 93%.  Respiratory rate still in the 40s, however patient's father was agreeable to patient receiving a PRN dose of morphine.  RN aware.  Will continue to monitor.

## 2015-01-23 NOTE — Progress Notes (Signed)
Patient's father did not want [name] to have benadryl and then neupogen this morning. He stated he wanted to talk to his wife first and maybe then give the medication this evening. Patient's father has shared that he wants certain medications given spaced out rather than at the scheduled time that our physicians have ordered. Pt's father also requested that the palliative MD, Dr. Domingo Cocking be paged and the ICU MD Dr. Nelda Marseille.  Both MD's and they can not talk to patient's father until this afternoon. Pt's family will be notified.

## 2015-01-23 NOTE — Consult Note (Signed)
PULMONARY / CRITICAL CARE MEDICINE   Name: Jeffery [name] MRN: [id number] DOB: [birth year]    ADMISSION DATE:  01/22/2015 CONSULTATION DATE:  01/23/2015  REFERRING MD:  Hospitalist Service  CHIEF COMPLAINT:  Acute Hypoxic Respiratory Failure  STUDIES:  Port CXR 12/22:  Personally reviewed by me. Endotracheal tube in good position. Improved lung volumes. Improving right lung opacity. Persistent mildly worsened left lung opacity.  MICROBIOLOGY: Nasal Aspirate (12/16):  Enterobacter aerogenes Urine (12/15):  Negative Urine (12/11):  "Insignificant growth" GI PCR (12/20):  Negative C Diff (12/18):  Toxin/Antigen Negative C Diff (12/12):  Toxin/Antigen Negative C Diff (12/03):  Toxin/Antigen Negative  ANTIBIOTICS: Tygacil 12/21>>> Ceftaroline 12/12 - 12/20 Flagyl 12/12 - 12/20 Fortaz 12/11 - 12/13 Zyvox 12/11 - 12/12  SIGNIFICANT EVENTS: 12/11 - Admit to Hospital 12/22 - Patient intubated at mother's request & transferred to ICU  LINES/TUBES: OETT 8.0 12/22>>> Urinary Catheter>>> PEG>>> R Chest Port 03/01/14>>>  HISTORY OF PRESENT ILLNESS:  24 year old male with 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 Patient has ongoing issues with pancytopenia treated intermittently with Neupogen and blood product transfusions. Patient was previously treated for osteomyelitis with Invanz earlier this month and presented to Hospital on 01/29/2015 with sepsis likely secondary to aspiration/healthcare associated pneumonia. He was placed on broad-spectrum antibiotics. Patient has had multiple consultants including infectious diseases, gastroenterology, hematology-oncology, and palliative medicine. Previously the patient was on BiPAP for respiratory support with acute hypoxic respiratory failure from his pneumonia. He decompensated further this evening and after discussion with his mother the decision was made to partially resend his CODE STATUS to allow for intubation.  Patient has had multiple electrolyte abnormalities over the course of his admission which seemed to have improved. He does currently have low urine output with acute renal failure and shock that initially seems to be responsive to IV fluid boluses.  PAST MEDICAL HISTORY :  Past Medical History  Diagnosis Date  . CP (cerebral palsy), spastic, quadriplegic ([place])   . Osteoporosis   . Undescended testes   . Seasonal allergies   . IVH (intraventricular hemorrhage) ([place]) 01/01/1991    Grade IV  . Hip dislocation, bilateral ([place])   . Dysphagia   . Retinopathy of prematurity   . 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 Neuromuscular scoliosis   . Osteoporosis   . Complex partial seizures ([place])   . Generalized convulsive epilepsy without mention of intractable epilepsy   . Sinus bradycardia     HR drops to 38-40 while sleeping  . Blister of right heel     fluid filled; origin unknown  . Kidney stones     ?  Marland Kitchen Pneumonia      chronic pneumonia ,respitory failure dx Augest 2014  . Aspiration pneumonia ([place])     "chronic" (04/12/2014)  . OSA treated with BiPAP     "since age 68"   . Anemia   . 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 GERD (gastroesophageal reflux disease)   . Epilepsy ([place])   . Spastic quadriplegia ([place])   . Neutropenia ([place]) 07/03/2014    PAST SURGICAL HISTORY: Past Surgical History  Procedure Laterality Date  . Mole removal  "several B/T 2008-2010"    "from all over"  . Jejunostomy feeding tube  03/08/2013; ~ 09/2013; ~ 01/2014    "transgastric-jujunal feeding tube"  . Gastrostomy tube placement  11./02/1998       .  Button change  12/15/2010    Procedure: BUTTON CHANGE;  Surgeon: Gatha Mayer, MD;  Location: [facility];  Service: Endoscopy;  Laterality: N/A;  . Peg placement  10/07/2011    Procedure: PERCUTANEOUS ENDOSCOPIC GASTROSTOMY (PEG) REPLACEMENT;  Surgeon: Lafayette Dragon, MD;  Location: [facility]  ENDOSCOPY;  Service: Endoscopy;  Laterality: N/A;  Needs 18 F 2.5 button ordered-dl  . Inguinal hernia repair Bilateral 1992  . Retinopathy of prematurity surgery  1992  . Achilles tendon lengthening Bilateral 12/1998  . Tendon release  12/1998    Soft tissue releases  wrists and fingers [Other]  . Infusion pump implantation  07/25/2000; 2013    Intrathecal baclofen pump  . Peg placement N/A 06/05/2012    Procedure: PERCUTANEOUS ENDOSCOPIC GASTROSTOMY (PEG) REPLACEMENT;  Surgeon: Lafayette Dragon, MD;  Location: [facility] ENDOSCOPY;  Service: Endoscopy;  Laterality: N/A;  button 76fr.2.5cm  . Strabismus surgery Bilateral 1993    "3 on right; 2 on left"  . Back surgery  ~ 2008    Harrington Rods in back needs to be log rolled  . Tendon repair Bilateral 09/05/2012    Procedure: LENGTHENING OF DIGITAL FLEXOR TENDONS BILTERAL HANDS;  Surgeon: Jolyn Nap, MD;  Location: [facility];  Service: Orthopedics;  Laterality: Bilateral;  . Peg placement N/A 09/13/2012    Procedure: PERCUTANEOUS ENDOSCOPIC GASTROSTOMY (PEG) REPLACEMENT;  Surgeon: Lafayette Dragon, MD;  Location: [facility];  Service: Endoscopy;  Laterality: N/A;  . Flexible sigmoidoscopy N/A 10/30/2012    Procedure: FLEXIBLE SIGMOIDOSCOPY;  Surgeon: Cleotis Nipper, MD;  Location: [facility] ENDOSCOPY;  Service: Endoscopy;  Laterality: N/A;  . Peg placement N/A 11/15/2012    Procedure: PERCUTANEOUS ENDOSCOPIC GASTROSTOMY (PEG) REPLACEMENT;  Surgeon: Jeryl Columbia, MD;  Location[facility] END[facility]COPY;  Service: Endoscopy;  Laterality: N/A;  . Gastrostomy tube placement  11./02/1998  . Rectal biopsy  10/29/2012    S/P diarrhea from Vancomycin  . Portacath placement Right 11/25/2012    chest  . Eye surgery       Allergies  Allergen Reactions  . Depakote [Divalproex Sodium] Other (See Comments)    Causes pancreatitis   . Keppra [Levetiracetam] Other (See Comments)    Bone marrow suppression  . Vimpat [Lacosamide] Rash  . Adhesive [Tape] Other (See Comments)     Rips skin off (paper tape is ok)  . Neulasta [Pegfilgrastim] Other (See Comments)    Fever, tachycardia    No current facility-administered medications on file prior to encounter.   Current Outpatient Prescriptions on File Prior to Encounter  Medication Sig  . acetaminophen (TYLENOL) 160 MG/5ML solution Place 500 mg into feeding tube every 6 (six) hours as needed for moderate pain or fever.   Marland Kitchen acetaminophen (TYLENOL) 325 MG suppository Place 650 mg rectally every 6 (six) hours as needed for fever.  Marland Kitchen albuterol (PROVENTIL HFA;VENTOLIN HFA) 108 (90 BASE) MCG/ACT inhaler Inhale 2 puffs into the lungs every 4 (four) hours as needed for shortness of breath.  Marland Kitchen albuterol (PROVENTIL) (2.5 MG/3ML) 0.083% nebulizer solution Take 2.5 mg by nebulization See admin instructions. Give 1 vial (2.5 mg) twice daily (8am and 8pm) and every 4 hours as needed for shortness of breath or wheezing  . baclofen (GABLOFEN) 40000 MCG/20ML SOLN by Intrathecal route continuous. 385.2 mcg in 24 hours  . bag balm OINT ointment Apply 1 application topically See admin instructions. Apply to sacral area with each diaper change  . calcium carbonate, dosed in mg elemental calcium, 1250 MG/5ML Place 1,250  mg into feeding tube 3 (three) times daily. 8am , 2pm, and 8pm  . Cholecalciferol (VITAMIN D3) 400 UNIT/ML LIQD Give 2.5 mLs by tube daily. 1000mg  daily  . DIASTAT ACUDIAL 20 MG GEL Place 12.5 mg rectally as needed (for seizure lasting 2 minutes or longer or repetitive seizures - no more than 1 dose in 12 hours (not given with nasal versed)).   . dicyclomine (BENTYL) 10 MG/5ML syrup Place 10 mg into feeding tube every 8 (eight) hours as needed (abdominal cramps). Do not exceed 5 days in one week  . diphenhydrAMINE (BENADRYL) 12.5 MG/5ML liquid Place 25 mg into feeding tube 4 (four) times daily as needed (30 min before Neupogen).   Marland Kitchen diphenoxylate-atropine (LOMOTIL) 2.5-0.025 MG/5ML liquid 10 mLs by Gastric Tube route every 6 (six)  hours as needed for diarrhea or loose stools.   . filgrastim (NEUPOGEN) 300 MCG/ML injection Inject 1 mL (300 mcg total) into the skin once a week. (Patient taking differently: Inject 300 mcg into the skin as needed (neutropenia). )  . folic acid (FOLVITE) 1 MG tablet Give 1 mg by tube every morning. 8:00am per G tube  . furosemide (LASIX) 10 MG/ML solution 20 mg by Per J Tube route daily as needed for fluid or edema.   . gabapentin (NEURONTIN) 250 MG/5ML solution Take 18 mL 3 times daily (Patient taking differently: Place 900 mg into feeding tube 3 (three) times daily. Take 18 mL 3 times daily)  . glycopyrrolate (ROBINUL) 1 MG tablet Place 1 tablet (1 mg total) into feeding tube 3 (three) times daily as needed (excessive secretions).  . Heparin Lock Flush (HEPARIN FLUSH, PORCINE,) 100 UNIT/ML injection 5 mLs (500 Units total) by Intracatheter route as needed (Prior to de-accessing port).  Marland Kitchen ibuprofen (ADVIL,MOTRIN) 100 MG/5ML suspension 400 mg by Gastric Tube route every 6 (six) hours as needed for fever (pain).   Marland Kitchen ketoconazole (NIZORAL) 2 % cream Apply 1 application topically 2 (two) times daily as needed for irritation.  . lidocaine (XYLOCAINE) 5 % ointment APPLY AS DIRECTED 30 minutes PRIOR TO BLOOD DRAWS  . metoCLOPramide (REGLAN) 5 MG/5ML solution Place 10 mLs (10 mg total) into feeding tube 4 (four) times daily -  before meals and at bedtime.  . midazolam (VERSED) 5 MG/ML injection Place 2 mLs (10 mg total) into the nose once. Draw up 74ml in 2 syringes. Remove blue vial access device. Attach syringe to nasal atomizer for intranasal administration. Give 77ml in right nostril x 2 for seizures lasting 2 minutes or longer or for repetitive seizures in a short period of time. (Patient taking differently: Place 10 mg into the nose as needed (for seizure lasting 2 minutes or longer or repetitive seizures - no more than 1 dose in 24 hours (if unable to give diastat)). Draw up 75ml in 2 syringes. Remove blue  vial access device. Attach syringe to nasal atomizer for intranasal administration. Give 67ml in right nostril x 2 for seizures lasting 2 minutes or longer or for repetitive seizures in a short period of time.)  . Multiple Vitamin (MULTIVITAMIN) LIQD 10 mLs by Gastric Tube route 2 (two) times daily.   . Multiple Vitamins-Minerals (ZINC PO) 5 mLs by Gastric Tube route 2 (two) times daily. 244 mg / 5 mL zinc solution compounded by [facility]  . mupirocin ointment (BACTROBAN) 2 % Apply 1 application topically as needed (to G-T site). (Patient taking differently: Apply 1 application topically 3 (three) times daily as needed (for redness  at the G-T site). )  . Nutritional Supplements (PROMOD) LIQD Give 40 mLs by tube 2 (two) times daily. 8am and 5pm  . Nutritional Supplements (TWOCAL HN) LIQD 237 mLs by Per J Tube route See admin instructions. T.3 can at 46 cc hour x 12 hours - Gatorade 172 cc pre and post each and extra 172 bid.  Marland Kitchen omeprazole (PRILOSEC) 2 mg/mL SUSP Take 20 mg by mouth 2 (two) times daily.  . OnabotulinumtoxinA (BOTOX IJ) Inject as directed See admin instructions. Every 3 months  . ONFI 2.5 MG/ML solution Give 1.64ml via gastrostomy tube at 8 AM, 2 PM and 8 PM  . OVER THE COUNTER MEDICATION 2 capsules by Per J Tube route 3 (three) times daily.  . OXYGEN-HELIUM IN Inhale 3 L into the lungs as needed (o2 at 90%). Oxygen PRN to keep O2 Sat at 90%  . potassium chloride (KLOR-CON) 20 MEQ packet Give 20 meq daily except twice daily with lasix, given through J port (Patient taking differently: Take 20 mEq by mouth daily. Give an additional dose when taking lasix)  . Pseudoephedrine HCl (SUDAFED CHILDRENS) 15 MG/5ML LIQD 30 mg by Gastric Tube route 3 (three) times daily. 8am, 2pm, 8pm  . sodium chloride 0.9 % injection Flush port after use with 10cc NS. Disp# 10 - 10cc syringes  . sodium phosphate (FLEET) enema Place 1 enema rectally daily as needed (gas buildup/ bloating).   . sucralfate  (CARAFATE) 1 GM/10ML suspension 1 g by Gastric Tube route 4 (four) times daily. 7am, 12pm, 5pm, 7pm Administer alone 30 minutes prior to other medications.  . Zinc Oxide (BALMEX EX) Apply 1 application topically See admin instructions. Apply to sacral with each diaper change  . Amino Acids-Protein Hydrolys (FEEDING SUPPLEMENT, PRO-STAT SUGAR FREE 64,) LIQD Take 30 mLs by mouth 2 (two) times daily. (Patient not taking: Reported on 01/31/2015)    FAMILY HISTORY: Patient adopted with unknown family medical history.  SOCIAL HISTORY: Social History  Substance Use Topics  . Smoking status: Never Smoker   . Smokeless tobacco: Never Used     Comment: never used tobacco  . Alcohol Use: No    REVIEW OF SYSTEMS:  Unobtainable as patient is currently intubated.  SUBJECTIVE:   VITAL SIGNS: BP 38/29 mmHg  Pulse 102  Temp(Src) 98.7 F (37.1 C) (Oral)  Resp 26  Ht 4\' 11"  (1.499 m)  Wt 129 lb 10.1 oz (58.8 kg)  BMI 26.17 kg/m2  SpO2 100%  HEMODYNAMICS:    VENTILATOR SETTINGS: Vent Mode:  [-] PRVC FiO2 (%):  [50 %-100 %] 100 % Set Rate:  [20 bmp] 20 bmp Vt Set:  [400 mL] 400 mL PEEP:  [5 cmH20] 5 cmH20 Plateau Pressure:  [31 cmH20] 31 cmH20  INTAKE / OUTPUT: I/O last 3 completed shifts: In: 3083.3 [I.V.:1943.3; Other:890; IV Piggyback:250] Out: [id number] [Urine:1750; [id number]  PHYSICAL EXAMINATION: General:  No acute distress. Intubated in intensive care unit.  Integument:  Warm & dry. No rash on exposed skin.  Lymphatics:  No appreciated cervical or supraclavicular lymphadenoapthy. HEENT:  Endotracheal tube in place. Pupils pinpoint. No scleral injection. Cardiovascular:  Regular rhythm. Unable to appreciate JVD given body positioning.  Pulmonary:  Coarse breath sounds bilaterally. Symmetric chest wall rise on ventilator. Abdomen: Soft. Normal bowel sounds. Nondistended. PEG tube in place. Musculoskeletal:  No appreciated joint effusion. Multiple contractures noted. Neurological:   Not following commands. Pupils equal. Psychiatric:  Unable to assess given intubation and altered mentation.  LABS:  BMET  Recent Labs Lab 01/22/15 0500 01/23/15 0409 01/23/15 1028  NA 143 139 140  K 4.1 4.2 4.3  CL 110 109 110  CO2 16* 15* 16*  BUN 15 22* 24*  CREATININE 1.13 1.36* 1.21  GLUCOSE 87 93 93    Electrolytes  Recent Labs Lab 01/17/15 0400  01/20/15 0420  01/22/15 0500 01/23/15 0409 01/23/15 1028  CALCIUM 8.1*  < > 8.3*  < > 9.0 9.1 9.2  MG 1.6*  --  1.3*  --   --  2.0  --   < > = values in this interval not displayed.  CBC  Recent Labs Lab 01/21/15 0435 01/22/15 0500 01/23/15 0409  WBC 2.8* 9.4 4.3  HGB 10.4* 10.3* 9.3*  HCT 35.0* 35.7* 30.7*  PLT 97* 87* 62*    Coag's No results for input(s): APTT, INR in the last 168 hours.  Sepsis Markers No results for input(s): LATICACIDVEN, PROCALCITON, O2SATVEN in the last 168 hours.  ABG No results for input(s): PHART, PCO2ART, PO2ART in the last 168 hours.  Liver Enzymes  Recent Labs Lab 01/19/15 0310 01/22/15 0500 01/23/15 0409  AST 19 14* 13*  ALT 20 12* 10*  ALKPHOS 469* 437* 333*  BILITOT 0.4 0.2* 0.4  ALBUMIN 1.5* 1.7* 1.6*    Cardiac Enzymes No results for input(s): TROPONINI, PROBNP in the last 168 hours.  Glucose  Recent Labs Lab 01/23/15 0013 01/23/15 0606 01/23/15 0856 01/23/15 1219 01/23/15 1537 01/23/15 2102  GLUCAP 101* 85 91 94 106* 97    Imaging Dg Chest Port 1 View  01/23/2015  CLINICAL DATA:  Endotracheal tube EXAM: PORTABLE CHEST 1 VIEW COMPARISON:  Earlier today FINDINGS: The carina is partly obscured by spinal fixation hardware. The endotracheal tube is estimated within 1 cm of the carina. Right-sided porta catheter, tip at the upper cavoatrial junction. Extensive airspace disease throughout the left lung compatible the aspiration or pneumonia. No effusion or cavitation. Normal heart size. IMPRESSION: 1. Tracheal intubation with tube tip within 1 cm of  the carina. 2. Unchanged extensive pneumonia or aspiration pneumonitis on the left. Improved right lung inflation. Electronically Signed   By: Monte Fantasia M.D.   On: 01/23/2015 21:25   Dg Chest Port 1 View  01/23/2015  CLINICAL DATA:  Cerebral palsy EXAM: PORTABLE CHEST 1 VIEW COMPARISON:  01/21/2015 FINDINGS: Patchy left upper lobe and left lower lobe airspace disease. Small left pleural effusion. No pneumothorax. Stable cardiomediastinal silhouette. No acute osseous abnormality. Thoracolumbar spinal fixation rod. Right-sided Port-A-Cath in unchanged position. IMPRESSION: 1. Patchy left upper lobe and left lower lobe airspace disease most concerning for pneumonia. Electronically Signed   By: Kathreen Devoid   On: 01/23/2015 18:27   ASSESSMENT / PLAN:  PULMONARY A: Acute Hypoxic Respiratory Failure HCAP/Aspiration Pneumonia  P:   Full Vent Support Xopenex Neb q6hr Unable to obtain ABG Checking venous blood gas to establish ventilation  CARDIOVASCULAR A:  Shock - Probably Hypovolemic vs Sepsis  P:  IVF Bolus Levophed to maintain MAP >65 TTE in AM Monitor on telemetry  Checking lactic acid  RENAL A:   Acute Renal Failure  P:   Monitoring UOP Trending Renal Function daily with BUN/Creatinine Checking electrolytes now & repeat in the morning  GASTROINTESTINAL A:   Dysphagia - S/P J-tube Diarrhea - Possibly secondary to antibiotics. C diff neg bu lactoferrin positive.  P:   GI Following Free Water 136mL VT tid Protonix VT bid Florastor bid Possible un prepped flexible sigmoidoscopy tomorrow (12/23) Cholestyramine  tid VT Lomotil VT tid Continuing tube feedings Folic Acid VT daily  HEMATOLOGIC A:   Thrombocytopenia - Worsening. Question secondary to Zyvox vs Keppra (stopped Feb 2016). Anemia - Worsening. No signs of active bleeding. Neutropenia  P:  Heme-Onc/Dr. Marin Olp Following Neupogen per Heme-Onc Trending cell counts daily with CBC SCDs for DVT  prophylaxis Avoiding Chemical DVT prophylaxis given thrombocytopenia  INFECTIOUS A:   Sepsis - Secondary to HCAP/Aspiration Pneumonia vs Pelvic Osteomyelitis HCAP/Aspiration Pneumonia Pelvic Osteomyelitis w/ Sacral Decubitus - Previously on Invanz starting 12/6 until admission  P:   Previously seen by ID - Signed off 12/21 Continuing Tygacil Day #10/28 per ID recommendations Tracheal Aspirate culture now that he is intubated Monitor for fever  ENDOCRINE A:   No acute issue.    P:   Accu-Checks q4hr Low dose SSI per algorithm  NEUROLOGIC A:   H/O Cerebral Palsy H/O Spastic Quadriplegia H/O Seizures  P:   RASS goal: 0 Holding on sedating medications Fentanyl IV prn Baclofen Pump Neurontin 900mg  VT tid Onfi/Clobazam VT tid  FAMILY  - Updates: Father updated at bedside & mother updated via conference call this evening by Dr. Ashok Cordia.  TODAY'S SUMMARY:  Unfortunate 24 year old male with known cerebral palsy with spastic quadriplegia and now new shock with worsening acute respiratory failure requiring endotracheal intubation. Both mother and father recognizes the patient has multiple medical problems that cannot be fixed but wish to give the patient the best opportunity possible to recover from his pneumonia. Mother agrees to continue aggressive interventions including vasopressor support but would not wish the patient to receive bolus medications, defibrillation, or chest compressions in the event of cardiac arrest. Plan to attempt further IV access and obtain lab work.  I have spent a total of 60 minutes of critical care time this evening caring for the patient, reviewing the patient's electronic medical record, and in a lengthy family discussion with the patient's father bedside and mother via phone this evening.  Sonia Baller Ashok Cordia, M.D. [facility] Pulmonary & Critical Care Pager:  [phone] After 3pm or if no response, call [phone] 01/23/2015, 9:30 PM

## 2015-01-23 NOTE — Progress Notes (Signed)
BP currently 103/41 with MAP of 58. MD Nestor at bedside. Orders received to give NS 500 cc bolus and turn levophed off once bolus is complete.Will continue to monitor.

## 2015-01-23 NOTE — Progress Notes (Signed)
   01/23/15 1300  Clinical Encounter Type  Visited With Health care provider  Visit Type Social support;Psychological support  Ch met with several of the [facility] team (RN/MD) and heard their concerns and frustrations with family and family attempting to direct care for pt; [place] will continue to support pt and [facility] team and offer spiritual care as requested to family; family concerned with demons fighting against patient health. Gwynn Burly 1:51 PM

## 2015-01-23 NOTE — Progress Notes (Signed)
Patient is currently resting on Bipap.  Decreased FIO2 to 40%.  Per father, would like to make sure patient oxygen level remains well on 40% and then once he speaks with the doctor would like to try him off of Bipap.  Was told to call RT when he was ready.  Will continue to monitor patient.

## 2015-01-23 NOTE — Procedures (Signed)
RT arrived to find pt stats in the high 70s on a NRB. Pt cyanotic with agonal breathing. Father asked for pt to be placed on BiPAP however due to an order, RT was unable to comply.  Father asked to speak with RN and MD.  Father spoke with mother who decided to resend DNR order. Mother spoke with pt's nurse, and chaplin.  Camera operator and [facility] notified. Mother wishes to have the pt intubated but without chest compressions.  Code Blue called for intubation.  Pt intubated without complications.  Pt then moved to ICU.

## 2015-01-23 NOTE — Progress Notes (Signed)
Pt mother refusing to allow pt's RN into the room stating that the RN was rude to her and she will not be taking care of her son. Current RN entered the room with the on coming RN to introduce her to the patient and family, per both RN's neither of them where rude or said any inappropriate comments. Explained to the pt mother we would not be able to change nurses that Jeffery [name] would be his nurse this evening. Pt mother stated "I can take care of him at home better I am just going to sign him out." Explained to pt mother transportation is not provided when signing out AMA. Pt step-father came into the room and the mother went home. The step-father stated that she has gone home and everything was fine, when asked about the AMA he stated they are not signing him out. When asked about his current nurse he stated he was fine with his nurse and he just wanted Jeffery [name] to be taken care of. Ethics consulted about mothers decision to [facility]. See previous notes on mothers behavior toward staff and demands.

## 2015-01-23 NOTE — Care Management (Signed)
1345 01-23-15 CM received call from pt's mother [name]- in reference to wanting answers on why [name] had not been moved to unit [place]. Per [name] if she was not moved they may need to leave AMA on Friday. Mom stated -She would rather be on  Unit 3 w  due to she knows he still needs to be hospitalized. Mom did state that Ethics is involved in the case, CM did listen and CM voiced that she would try to look into efforts of transferring to this unit, However with Ethics and other dynamics  involved that may not be the answer and CM could not state this could happen. CM did relay this information to the Unit Director Tammy and the CM covering [place]. Levada Dy was to touch base with mother in ref to [name]'s care. No further needs from this CM. Bethena Roys, RN,BSN [phone]

## 2015-01-23 NOTE — Progress Notes (Addendum)
CM received message from CM(Brenda) phone conversation with pt"s mom regarding wanting pt to be moved to 3w and if pt was not moved they may leave AMA on Friday. Attempted phone called made to pt"s mom by CM to confirm conversation however the call went directly to voice mail and CM was unable to leave voice message.CM  In route to pt's room made MD aware that pt's mom is requesting for pt to be transferred to 3w and if not may consider AMA on tomorrow and would like to speak with him. MD stated will f/u with mom. CM entered pt's room and dad was on phone with mom. Dad gets off phone with mom. CM wasn't offered to speak with mom by dad but dad  states mom would like for pt to be move to 3w and is requesting home hospice. CM called MD and made him aware and order given to CM for home hospice. Choice list provided to dad and faxed Choice list to mom [phone]. In the midst of completing fax. CM received call from CSW(Vanessa) to call nurse navigator(Traci) @ [phone] PCP's office.  CM spoke with nurse Traci and she communicated mom desires for home hospice  and specific equipment needed for pt oxygen delivery. CM stated once home hospice agency selected they will setup and provide pt with what's needed. CM to f/u with disposition needs. Whitman Hero RN,BSN, [name]

## 2015-01-24 ENCOUNTER — Encounter: Payer: Self-pay | Admitting: Hematology & Oncology

## 2015-01-24 ENCOUNTER — Inpatient Hospital Stay (HOSPITAL_COMMUNITY): Payer: Medicaid Other

## 2015-01-24 DIAGNOSIS — R578 Other shock: Secondary | ICD-10-CM

## 2015-01-24 LAB — CBC WITH DIFFERENTIAL/PLATELET
BASOS ABS: 0 10*3/uL (ref 0.0–0.1)
BASOS PCT: 0 %
Basophils Absolute: 0 10*3/uL (ref 0.0–0.1)
Basophils Relative: 0 %
EOS PCT: 0 %
EOS PCT: 0 %
Eosinophils Absolute: 0 10*3/uL (ref 0.0–0.7)
Eosinophils Absolute: 0 10*3/uL (ref 0.0–0.7)
HEMATOCRIT: 31.9 % — AB (ref 39.0–52.0)
HEMATOCRIT: 37.8 % — AB (ref 39.0–52.0)
HEMOGLOBIN: 11.2 g/dL — AB (ref 13.0–17.0)
Hemoglobin: 9.5 g/dL — ABNORMAL LOW (ref 13.0–17.0)
LYMPHS ABS: 1.2 10*3/uL (ref 0.7–4.0)
LYMPHS PCT: 1 %
Lymphocytes Relative: 9 %
Lymphs Abs: 0.2 10*3/uL — ABNORMAL LOW (ref 0.7–4.0)
MCH: 30.4 pg (ref 26.0–34.0)
MCH: 30.8 pg (ref 26.0–34.0)
MCHC: 29.8 g/dL — ABNORMAL LOW (ref 30.0–36.0)
MCHC: 29.6 g/dL — ABNORMAL LOW (ref 30.0–36.0)
MCV: 102.4 fL — AB (ref 78.0–100.0)
MCV: 103.6 fL — AB (ref 78.0–100.0)
MONO ABS: 1.2 10*3/uL — AB (ref 0.1–1.0)
MONOS PCT: 8 %
MONOS PCT: 9 %
Monocytes Absolute: 1.9 10*3/uL — ABNORMAL HIGH (ref 0.1–1.0)
NEUTROS PCT: 82 %
NEUTROS PCT: 91 %
Neutro Abs: 10.9 10*3/uL — ABNORMAL HIGH (ref 1.7–7.7)
Neutro Abs: 21.3 10*3/uL — ABNORMAL HIGH (ref 1.7–7.7)
PLATELETS: 95 10*3/uL — AB (ref 150–400)
Platelets: 121 10*3/uL — ABNORMAL LOW (ref 150–400)
RBC: 3.08 MIL/uL — AB (ref 4.22–5.81)
RBC: 3.69 MIL/uL — AB (ref 4.22–5.81)
RDW: 19.9 % — ABNORMAL HIGH (ref 11.5–15.5)
RDW: 19.9 % — AB (ref 11.5–15.5)
WBC: 13.3 10*3/uL — ABNORMAL HIGH (ref 4.0–10.5)
WBC: 23.4 10*3/uL — AB (ref 4.0–10.5)

## 2015-01-24 LAB — RENAL FUNCTION PANEL
Albumin: 1.5 g/dL — ABNORMAL LOW (ref 3.5–5.0)
Anion gap: 17 — ABNORMAL HIGH (ref 5–15)
BUN: 33 mg/dL — ABNORMAL HIGH (ref 6–20)
CHLORIDE: 108 mmol/L (ref 101–111)
CO2: 10 mmol/L — AB (ref 22–32)
CREATININE: 2.01 mg/dL — AB (ref 0.61–1.24)
Calcium: 8.8 mg/dL — ABNORMAL LOW (ref 8.9–10.3)
GFR, EST AFRICAN AMERICAN: 52 mL/min — AB (ref >60)
GFR, EST NON AFRICAN AMERICAN: 45 mL/min — AB (ref >60)
Glucose, Bld: 95 mg/dL (ref 65–99)
POTASSIUM: 5.1 mmol/L (ref 3.5–5.1)
Phosphorus: 6.9 mg/dL — ABNORMAL HIGH (ref 2.5–4.6)
Sodium: 135 mmol/L (ref 135–145)

## 2015-01-24 LAB — PROCALCITONIN
PROCALCITONIN: 17.13 ng/mL
Procalcitonin: 5.85 ng/mL

## 2015-01-24 LAB — POCT I-STAT 3, VENOUS BLOOD GAS (G3P V)
Acid-base deficit: 16 mmol/L — ABNORMAL HIGH (ref 0.0–2.0)
Bicarbonate: 13.5 mEq/L — ABNORMAL LOW (ref 20.0–24.0)
O2 Saturation: 35 %
PCO2 VEN: 44.9 mmHg — AB (ref 45.0–50.0)
PH VEN: 7.087 — AB (ref 7.250–7.300)
TCO2: 15 mmol/L (ref 0–100)
pO2, Ven: 29 mmHg — CL (ref 30.0–45.0)

## 2015-01-24 LAB — GLUCOSE, CAPILLARY
GLUCOSE-CAPILLARY: 91 mg/dL (ref 65–99)
GLUCOSE-CAPILLARY: 80 mg/dL (ref 65–99)
GLUCOSE-CAPILLARY: 56 mg/dL — AB (ref 65–99)
Glucose-Capillary: 81 mg/dL (ref 65–99)
Glucose-Capillary: 92 mg/dL (ref 65–99)

## 2015-01-24 LAB — POCT I-STAT 3, ART BLOOD GAS (G3+)
Acid-base deficit: 18 mmol/L — ABNORMAL HIGH (ref 0.0–2.0)
Bicarbonate: 13 mEq/L — ABNORMAL LOW (ref 20.0–24.0)
O2 Saturation: 85 %
Patient temperature: 99.7
TCO2: 15 mmol/L (ref 0–100)
pCO2 arterial: 53.3 mmHg — ABNORMAL HIGH (ref 35.0–45.0)
pH, Arterial: 7 — CL (ref 7.350–7.450)
pO2, Arterial: 77 mmHg — ABNORMAL LOW (ref 80.0–100.0)

## 2015-01-24 LAB — PHOSPHORUS: PHOSPHORUS: 7 mg/dL — AB (ref 2.5–4.6)

## 2015-01-24 LAB — COMPREHENSIVE METABOLIC PANEL
ALBUMIN: 1.4 g/dL — AB (ref 3.5–5.0)
ALT: 10 U/L — AB (ref 17–63)
AST: 19 U/L (ref 15–41)
Alkaline Phosphatase: 360 U/L — ABNORMAL HIGH (ref 38–126)
Anion gap: 18 — ABNORMAL HIGH (ref 5–15)
BUN: 31 mg/dL — AB (ref 6–20)
CHLORIDE: 107 mmol/L (ref 101–111)
CO2: 12 mmol/L — ABNORMAL LOW (ref 22–32)
Calcium: 9.2 mg/dL (ref 8.9–10.3)
Creatinine, Ser: 1.7 mg/dL — ABNORMAL HIGH (ref 0.61–1.24)
GFR calc Af Amer: >60 mL/min (ref >60)
GFR calc non Af Amer: 55 mL/min — ABNORMAL LOW (ref >60)
GLUCOSE: 114 mg/dL — AB (ref 65–99)
POTASSIUM: 5.5 mmol/L — AB (ref 3.5–5.1)
Sodium: 137 mmol/L (ref 135–145)
Total Bilirubin: 0.4 mg/dL (ref 0.3–1.2)
Total Protein: 4.2 g/dL — ABNORMAL LOW (ref 6.5–8.1)

## 2015-01-24 LAB — MAGNESIUM
MAGNESIUM: 1.8 mg/dL (ref 1.7–2.4)
MAGNESIUM: 2.1 mg/dL (ref 1.7–2.4)

## 2015-01-24 LAB — LACTIC ACID, PLASMA
Lactic Acid, Venous: 7.9 mmol/L (ref 0.5–2.0)
Lactic Acid, Venous: 6.5 mmol/L (ref 0.5–2.0)

## 2015-01-24 LAB — TROPONIN I
TROPONIN I: 5.03 ng/mL — AB (ref <0.031)
Troponin I: 1.13 ng/mL (ref <0.031)

## 2015-01-24 LAB — MRSA PCR SCREENING: MRSA BY PCR: NEGATIVE

## 2015-01-24 MED ORDER — CHLORHEXIDINE GLUCONATE 0.12% ORAL RINSE (MEDLINE KIT)
15.0000 mL | Freq: Two times a day (BID) | OROMUCOSAL | Status: DC
  Administered 2015-01-24: 15 mL via OROMUCOSAL

## 2015-01-24 MED ORDER — DEXTROSE 50 % IV SOLN: 25.0000 mL | Freq: Once | INTRAVENOUS | Status: DC

## 2015-01-24 MED ORDER — DEXTROSE 5 % IV SOLN
0.0000 ug/min | INTRAVENOUS | Status: DC
  Administered 2015-01-24: 40 ug/min via INTRAVENOUS
  Filled 2015-01-24 (×2): qty 16

## 2015-01-24 MED ORDER — ANTISEPTIC ORAL RINSE SOLUTION (CORINZ)
7.0000 mL | OROMUCOSAL | Status: DC
  Administered 2015-01-24: 7 mL via OROMUCOSAL

## 2015-01-24 MED FILL — Alosetron HCl Tab 1 MG (Base Equiv): ORAL | Qty: 1 | Status: AC

## 2015-01-26 LAB — CULTURE, RESPIRATORY W GRAM STAIN: Special Requests: NORMAL

## 2015-01-26 LAB — CULTURE, RESPIRATORY

## 2015-01-29 ENCOUNTER — Telehealth: Payer: Self-pay | Admitting: *Deleted

## 2015-01-29 NOTE — Telephone Encounter (Signed)
I called and let Mom know that Dr Gaynell Face is out of the office this week. She said that she wanted to let Dr Gaynell Face know that Jeffery [name] passed away at [facility] on 02/01/23 and that he was buried yesterday. [name] said that she wants Dr Gaynell Face to call her when he is back in the office next week. I expressed my condolences and told her that I would let Dr Gaynell Face know. TG

## 2015-01-29 NOTE — Telephone Encounter (Signed)
[name] called and left a voicemail for Dr. Gaynell Face to please call her back regarding [name].

## 2015-02-02 NOTE — Progress Notes (Signed)
Sputum aspirate sent to Lab.

## 2015-02-02 NOTE — Procedures (Signed)
Central Venous Catheter Insertion Procedure Note Daejuan Drennon [id number] Oct 23, 1990  Procedure: Insertion of Central Venous Catheter Indications: Assessment of intravascular volume, Drug and/or fluid administration and Frequent blood sampling  Procedure Details Consent: Risks of procedure as well as the alternatives and risks of each were explained to the (patient/caregiver).  Consent for procedure obtained. Time Out: Verified patient identification, verified procedure, site/side was marked, verified correct patient position, special equipment/implants available, medications/allergies/relevent history reviewed, required imaging and test results available.  Performed  Maximum sterile technique was used including antiseptics, cap, gloves, gown, hand hygiene, mask and sheet. Skin prep: Chlorhexidine; local anesthetic administered A antimicrobial bonded/coated triple lumen catheter was placed in the left internal jugular vein using the Seldinger technique. Ultrasound guidance used.Yes.   Catheter placed to 18 cm. Blood aspirated via all 3 ports and then flushed x 3. Line sutured x 2 and dressing applied.  Evaluation Blood flow good Complications: No apparent complications Patient did tolerate procedure well. Chest X-ray ordered to verify placement.  CXR: normal.  Georgann Housekeeper, AGACNP-BC [facility] Pulmonology/Critical Care Pager [phone] or [phone]  Feb 18, 2015 5:12 AM

## 2015-02-02 NOTE — Progress Notes (Addendum)
Post mortem care provided (placed in post mortem bag) and left pt's socks on and the stuffed duck in pt's hand that was given by pt's mother.

## 2015-02-02 NOTE — Progress Notes (Addendum)
09:23 Levophed decreased to 20 mcg per MD order.

## 2015-02-02 NOTE — Progress Notes (Signed)
Critical Venous Blood Gas results called to Dr Nelda Marseille at [phone] on 02-22-2015. No changes at this time. RT will continue to monitor.

## 2015-02-02 NOTE — Progress Notes (Addendum)
Patient decompensating with refractory shock.  He is clearly going to pass today.  Levophed at 40 mcg but HR of 140/min, will limit levophed to 20 to avoid tachycardia and attempt to use more fluid instead.  I called the mother earlier today to come in.  She arrived 2 hours later.  RN called me to inform me that they are here.  I was in the middle of performing a tracheostomy.  Once done, I got another page after the trach informed that the patient has passed.  I went up to the neuro ICU and father was walking in.  I spoke with him, informed him that cody has passed and I asked for the patient's mother who was not on the unit and not in the waiting room.  Father called mother who did not answer.  Upon her arrival, father had called her and informed her that the patient has passed.  She became very irritated and informed me that she wanted to be the one to turn the drips off and take the ETT tube out and lay with him in bed while he dies.  I told her I am sorry she never told us that, we could have accommodated her.  She became very irritated and demanded she removes the ETT herself and I informed her that I am not sure if she is allowed to.  She asked the RN to leave the room.  We called medical examiner.  He is not an ME case. I called risk management because mother is insistent on taking the ETT out herself.  Risk management told me she can but would like RN to be in the room.  Will allow to extubate.  Additional CC time of 50 minutes.  Rush Farmer, M.D. [facility] Pulmonary/Critical Care Medicine. Pager: [phone]. After hours pager: [phone].

## 2015-02-02 NOTE — Progress Notes (Signed)
Sputum aspirate collected

## 2015-02-02 NOTE — Progress Notes (Signed)
Pt expired. Ventilator turned off but pt still attached as family just arrived. RT will continue to monitor and disconnect from ventilator when able.

## 2015-02-02 NOTE — Progress Notes (Signed)
Pt's BP low despite levophed, also tachycardic. MD spoke to mother via telephone. Mother supposed to come per MD.

## 2015-02-02 NOTE — Progress Notes (Addendum)
10:30 Dr. Nelda Marseille was paged and made aware of the pt's family arrival and continued soft VS and now HR 60s-70 on ordered max levophed 20 mcg drip. No orders received. At this time, Dr. Nelda Marseille was in a procedure but was to come when he finished. Pt's mother was standing out in hall but refused to enter pt's room.  She was told that Dr. Nelda Marseille had been here earlier and that he was in a procedure but would come as soon as he finished. She walked off the unit refusing to go into the patient's room. Pt's father, however, did visit in the room and was at the bedside. RN gave emotional support to father and brought tissues in room. RN came in to patient's room to provide care and treat a mild hypoglycemia and noted pauses and bradycardia on the monitor. Just prior to 11:15 pt's father stepped out of the room and then before RN could treat @ 11:17 asystole was noted on monitor, no pulse palpated by nursing staff. Pt was a partial code, only intubation. Dr. Nelda Marseille paged immediately and RT notified. Vent turned off but no removal of any tubes/drains. pt's father then returned to room and Dr. Nelda Marseille was present. Attempted to locate mother but she was unable to be found. Mother then arrived to room and stated she had been called already and came into room very unhappy. Dr. Nelda Marseille spoke with family at the bedside. The mother stated to the nurse she had wanted to remove ETT and drips, and then lie in the bed with the patient while he passed. The nurse tried to offer emotional support and told the mother that the patient passed away on the levophed drip and on the ventilator but the mother told the RN to stop talking and to "do what you need to do and then get out". Death confirmed by two RNs, [place], RN. No respiratory or cardiac activity auscultated.  The mother began to take the leads off the patient, removed the pt's rectal probe, gown, disconnected the feeding tube without first asking the  RN. Dr. Jobe Igo, the pt's PCP, arrived and went to the bedside to speak with the mother.  Per Dr. Nelda Marseille, risk management was consulted to confirm that mom could remove ETT with supervision if pt was not an ME case. Pt not an ME case per Dr. Neita Goodnight, medical examiner. Pt's mother then left the room after visiting a few minutes and speaking with Dr. Jobe Igo. Jeffery [name], pt's mother, then approached Dr. Jobe Igo and RN at the front desk and spoke directly to him and gave him a stuffed duck to place in pt's hand. RN attempted to give emotional support and tell Jeffery [name] that she could remove the ETT but the mother put her hand up and turned and walked away saying she was done and she left the unit, refusing to speak further with the nurse, who needed post mortem information. This was witnessed by Dr. Jobe Igo. 12:06 RN present at bedside as well as Dr. Jobe Igo, pt's PCP, during post mortem extubation by RT. Dr. Jobe Igo also present during post mortem d/c of central line, port-a-cath access, and foley catheter. Per Dr. Jobe Igo, pt's family was not coming back. He offered to take pt's belongings for family including two towels with pt's name embroidered on it, a Christmas blanket, a graduation picture, another framed picture, and a wooden cross. There was one towel that Dr. Jobe Igo instructed to throw away d/t being soiled. Pt's mother  had previously thrown all other belongings away including hygiene products and feeding supplement.

## 2015-02-02 NOTE — Progress Notes (Addendum)
Pt extubated by RT, as mother has left and is not going to extubate as originally planned. Ventilator cleaned at bedside then removed from pt's room per RN.

## 2015-02-02 NOTE — Progress Notes (Signed)
CRITICAL VALUE ALERT  Critical value received:  Lactic Acid 7.9, Troponin 1.13  Date of notification: 02-06-2015  Time of notification: 1:07 AM   Critical value read back:Yes.    Nurse who received alert:  Barrie Lyme, RN  MD notified (1st page):  Ashok Cordia  Time of first page:  1:07 AM   MD notified (2nd page):  Time of second page:  Responding MD:  Lamonte Sakai  Time MD responded:  1:10 AM

## 2015-02-02 NOTE — Progress Notes (Signed)
Bedside RN called [facility] for assistance with [facility] referral. Spoke with representative regarding patients condition and unfortunate expiration. Information taken by representative and was found to not be a candidate for organ donation due to circumstances. Documented and CDS sheet faxed over to [id number].

## 2015-02-02 NOTE — Progress Notes (Signed)
PULMONARY / CRITICAL CARE MEDICINE   Name: Jeffery [name] MRN: [id number] DOB: 1990/11/16    ADMISSION DATE:  01/11/2015 CONSULTATION DATE:  01/23/2015  REFERRING MD:  Hospitalist Service  CHIEF COMPLAINT:  Acute Hypoxic Respiratory Failure  STUDIES:  Port CXR 12/22:  Personally reviewed by me. Endotracheal tube in good position. Improved lung volumes. Improving right lung opacity. Persistent mildly worsened left lung opacity.  MICROBIOLOGY: Nasal Aspirate (12/16):  Enterobacter aerogenes Urine (12/15):  Negative Urine (12/11):  "Insignificant growth" GI PCR (12/20):  Negative C Diff (12/18):  Toxin/Antigen Negative C Diff (12/12):  Toxin/Antigen Negative C Diff (12/03):  Toxin/Antigen Negative  ANTIBIOTICS: Tygacil 12/21>>> Ceftaroline 12/12 - 12/20 Flagyl 12/12 - 12/20 Fortaz 12/11 - 12/13 Zyvox 12/11 - 12/12  SIGNIFICANT EVENTS: 12/11 - Admit to Hospital 12/22 - Patient intubated at mother's request & transferred to ICU  LINES/TUBES: OETT 8.0 12/22>>> Urinary Catheter>>> PEG>>> R Chest Port 03/01/14>>>  HISTORY OF PRESENT ILLNESS:  25 year old male with 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 Patient has ongoing issues with pancytopenia treated intermittently with Neupogen and blood product transfusions. Patient was previously treated for osteomyelitis with Invanz earlier this month and presented to Hospital on 01/06/2015 with sepsis likely secondary to aspiration/healthcare associated pneumonia. He was placed on broad-spectrum antibiotics. Patient has had multiple consultants including infectious diseases, gastroenterology, hematology-oncology, and palliative medicine. Previously the patient was on BiPAP for respiratory support with acute hypoxic respiratory failure from his pneumonia. He decompensated further this evening and after discussion with his mother the decision was made to partially resend his CODE STATUS to allow for intubation.  Patient has had multiple electrolyte abnormalities over the course of his admission which seemed to have improved. He does currently have low urine output with acute renal failure and shock that initially seems to be responsive to IV fluid boluses.  SUBJECTIVE: Respiratory failure overnight, intubated and pressors started.  VITAL SIGNS: BP 82/47 mmHg  Pulse 134  Temp(Src) 98.7 F (37.1 C) (Oral)  Resp 37  Ht 4\' 11"  (1.499 m)  Wt 67.8 kg (149 lb 7.6 oz)  BMI 30.17 kg/m2  SpO2 97%  HEMODYNAMICS:    VENTILATOR SETTINGS: Vent Mode:  [-] PRVC FiO2 (%):  [80 %-100 %] 100 % Set Rate:  [20 bmp] 20 bmp Vt Set:  [400 mL] 400 mL PEEP:  [5 cmH20] 5 cmH20 Plateau Pressure:  [24 cmH20-31 cmH20] 26 cmH20  INTAKE / OUTPUT: I/O last 3 completed shifts: In: 3477.5 [I.V.:1637.5; Other:360; NG/GT:780; IV Piggyback:700] Out: [id number] [Urine:1320; [id number]  PHYSICAL EXAMINATION: General:  No acute distress. Intubated in intensive care unit.  Integument:  Warm & dry. No rash on exposed skin.  Lymphatics:  No appreciated cervical or supraclavicular lymphadenoapthy. HEENT:  Endotracheal tube in place. Pupils pinpoint. No scleral injection. Cardiovascular:  Regular rhythm. Unable to appreciate JVD given body positioning.  Pulmonary:  Coarse breath sounds bilaterally. Symmetric chest wall rise on ventilator. Abdomen: Soft. Normal bowel sounds. Nondistended. PEG tube in place. Musculoskeletal:  No appreciated joint effusion. Multiple contractures noted. Neurological:  Not following commands. Pupils equal. Psychiatric:  Unable to assess given intubation and altered mentation.  LABS:  BMET  Recent Labs Lab 01/23/15 1028 01/27/2015 0005 January 27, 2015 0745  NA 140 137 135  K 4.3 5.5* 5.1  CL 110 107 108  CO2 16* 12* 10*  BUN 24* 31* 33*  CREATININE 1.21 1.70* 2.01*  GLUCOSE 93 114* 95    Electrolytes  Recent Labs Lab 01/23/15 0409  01/23/15 1028 02/22/15 0005 02-22-2015 0503 02/22/2015 0745   CALCIUM 9.1 9.2 9.2  --  8.8*  MG 2.0  --  2.1 1.8  --   PHOS  --   --  7.0*  --  6.9*    CBC  Recent Labs Lab 01/23/15 0409 2015/02/22 0005 Feb 22, 2015 0503  WBC 4.3 13.3* 23.4*  HGB 9.3* 9.5* 11.2*  HCT 30.7* 31.9* 37.8*  PLT 62* 95* 121*    Coag's No results for input(s): APTT, INR in the last 168 hours.  Sepsis Markers  Recent Labs Lab February 22, 2015 0005 02-22-2015 0008 02-22-15 0445 02/22/15 0503  LATICACIDVEN  --  7.9* 6.5*  --   PROCALCITON 5.85  --   --  17.13    ABG No results for input(s): PHART, PCO2ART, PO2ART in the last 168 hours.  Liver Enzymes  Recent Labs Lab 01/22/15 0500 01/23/15 0409 22-Feb-2015 0005 02-22-15 0745  AST 14* 13* 19  --   ALT 12* 10* 10*  --   ALKPHOS 437* 333* 360*  --   BILITOT 0.2* 0.4 0.4  --   ALBUMIN 1.7* 1.6* 1.4* 1.5*    Cardiac Enzymes  Recent Labs Lab 02/22/2015 0005 Feb 22, 2015 0503  TROPONINI 1.13* 5.03*    Glucose  Recent Labs Lab 01/23/15 1537 01/23/15 1958 01/23/15 2102 01/23/15 2347 02/22/15 0424 02-22-15 0734  GLUCAP 106* 101* 97 92 91 80    Imaging Dg Chest Port 1 View  2015/02/22  CLINICAL DATA:  Central line placement EXAM: PORTABLE CHEST 1 VIEW COMPARISON:  Yesterday at 9:14 p.m. FINDINGS: New left IJ central line with tip 13 mm below the upper cavoatrial junction. No complicating pneumothorax or new mediastinal widening. No interval displacement of right-sided porta catheter tip. The tip of the endotracheal tube is obscured by spinal fixation hardware, but appears to be above the carina. Unchanged extensive airspace disease throughout the left lung. Normal heart size. Mild atelectasis on the right. IMPRESSION: 1. New left IJ line with tip at the upper right atrium. No pneumothorax. 2. Remainder of the chest is stable from yesterday. Electronically Signed   By: Monte Fantasia M.D.   On: 02-22-2015 04:14   Dg Chest Port 1 View  01/23/2015  CLINICAL DATA:  Endotracheal tube EXAM: PORTABLE CHEST 1 VIEW  COMPARISON:  Earlier today FINDINGS: The carina is partly obscured by spinal fixation hardware. The endotracheal tube is estimated within 1 cm of the carina. Right-sided porta catheter, tip at the upper cavoatrial junction. Extensive airspace disease throughout the left lung compatible the aspiration or pneumonia. No effusion or cavitation. Normal heart size. IMPRESSION: 1. Tracheal intubation with tube tip within 1 cm of the carina. 2. Unchanged extensive pneumonia or aspiration pneumonitis on the left. Improved right lung inflation. Electronically Signed   By: Monte Fantasia M.D.   On: 01/23/2015 21:25   Dg Chest Port 1 View  01/23/2015  CLINICAL DATA:  Cerebral palsy EXAM: PORTABLE CHEST 1 VIEW COMPARISON:  01/21/2015 FINDINGS: Patchy left upper lobe and left lower lobe airspace disease. Small left pleural effusion. No pneumothorax. Stable cardiomediastinal silhouette. No acute osseous abnormality. Thoracolumbar spinal fixation rod. Right-sided Port-A-Cath in unchanged position. IMPRESSION: 1. Patchy left upper lobe and left lower lobe airspace disease most concerning for pneumonia. Electronically Signed   By: Kathreen Devoid   On: 01/23/2015 18:27   ASSESSMENT / PLAN:  PULMONARY A: Acute Hypoxic Respiratory Failure HCAP/Aspiration Pneumonia  P:   Full Vent Support Xopenex Neb q6hr Unable to obtain ABG  Checking venous blood gas to establish ventilation  CARDIOVASCULAR A:  Shock - Probably Hypovolemic vs Sepsis  P:  KVO IVF Levophed to maintain MAP >65, max of 20 mcg. TTE on hold for now Monitor on telemetry   RENAL A:   Acute Renal Failure and worsening.  P:   Monitoring UOP BMET in AM. Replace electrolytes as indicated. KVO IVF.  GASTROINTESTINAL A:   Dysphagia - S/P J-tube Diarrhea - Possibly secondary to antibiotics. C diff neg bu lactoferrin positive.  P:   GI signed off. Free Water 145mL VT tid Protonix VT bid Florastor bid Cholestyramine tid VT Lomotil VT  tid Continuing tube feedings Folic Acid VT daily  HEMATOLOGIC A:   Thrombocytopenia - Worsening. Question secondary to Zyvox vs Keppra (stopped Feb 2016). Anemia - Worsening. No signs of active bleeding. Neutropenia  P:  Heme-Onc/Dr. Marin Olp Following Neupogen per Heme-Onc Trending cell counts daily with CBC SCDs for DVT prophylaxis Avoiding Chemical DVT prophylaxis given thrombocytopenia  INFECTIOUS A:   Sepsis - Secondary to HCAP/Aspiration Pneumonia vs Pelvic Osteomyelitis HCAP/Aspiration Pneumonia Pelvic Osteomyelitis w/ Sacral Decubitus - Previously on Invanz starting 12/6 until admission  P:   Previously seen by ID - Signed off 12/21 Continuing Tygacil Day #11/28 per ID recommendations Tracheal Aspirate culture now that he is intubated Monitor for fever  ENDOCRINE A:   No acute issue.    P:   Accu-Checks q4hr Low dose SSI per algorithm  NEUROLOGIC A:   H/O Cerebral Palsy H/O Spastic Quadriplegia H/O Seizures  P:   RASS goal: 0 Holding on sedating medications Fentanyl IV prn Baclofen Pump Neurontin 900mg  VT tid Onfi/Clobazam VT tid  FAMILY  - Updates: Spoke with mother over the phone, patient is clearly dying, I see no prospect at improvement here.  Now in renal failure and inability to oxygenate, unable to get ABG due to contraction,  The patient is critically ill with multiple organ systems failure and requires high complexity decision making for assessment and support, frequent evaluation and titration of therapies, application of advanced monitoring technologies and extensive interpretation of multiple databases.   Critical Care Time devoted to patient care services described in this note is  35  Minutes. This time reflects time of care of this signee Dr Jennet Maduro. This critical care time does not reflect procedure time, or teaching time or supervisory time of PA/NP/Med student/Med Resident etc but could involve care discussion time.  Rush Farmer, M.D. [facility] Pulmonary/Critical Care Medicine. Pager: [phone]. After hours pager: [phone].

## 2015-02-02 NOTE — Progress Notes (Signed)
MD Byrum notified of pts low blood pressure. Orders received to restart Levophed

## 2015-02-02 NOTE — Progress Notes (Signed)
Nutrition Follow-up  INTERVENTION:   Continue home TF regimen:  3 cans TwoCal HN (parents bring from home; [facility] has a limited supply) via J-tube per day. Gatorade flushes (parents bring from home) 180 ml 8 times per day (before and after each can of TF is given plus 2 additional times per day). Prostat 30 ml TID via J-tube. TF regimen will provide 1725 kcals, 105 gm protein per day.   NUTRITION DIAGNOSIS:   Inadequate oral intake related to chronic illness, inability to eat as evidenced by NPO status. Ongoing.   GOAL:   Patient will meet greater than or equal to 90% of their needs Met.   MONITOR:   Labs, Weight trends, TF tolerance, Skin  ASSESSMENT:   25 y.o. 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 Admitted for severe sepsis and hypernatremia.  Patient is currently intubated on ventilator support MV: 13.6 L/min Temp (24hrs), Avg:99.4 F (37.4 C), Min:98.4 F (36.9 C), Max:100.6 F (38.1 C)  Medications reviewed and include: questran, lomotil, folvite, florastor Labs reviewed: phosphorus elevated (6.9) Per MD notes this am pt is dying and do not expect improvement.  Plan to continue TF and free water.   Diet Order:    NPO  Skin:  Wound (see comment) (stage IV buttocks, stage II buttocks)  Last BM:  1815 ml 12/22 via rectal tube  Height:   Ht Readings from Last 1 Encounters:  01/23/15 4' 11"  (1.499 m)   Weight:   Wt Readings from Last 1 Encounters:  01/23/15 149 lb 7.6 oz (67.8 kg)   Ideal Body Weight:     BMI:  Body mass index is 30.17 kg/(m^2).  Estimated Nutritional Needs:   Kcal:  1400-1600  Protein:  75-95 gm  Fluid:  >/= 1.5 L  EDUCATION NEEDS:   No education needs identified at this time  [place], [place], [place] Pager [phone] After Hours Pager

## 2015-02-02 NOTE — Progress Notes (Signed)
RT attempted ABG Left radial x1 with no success. Very Faint palpable pulse but thready. Charge RT attempted Right Radial and Right Brachial using doppler with no success either. CCM and RN made aware. RT will continue to monitor.

## 2015-02-02 NOTE — Progress Notes (Signed)
I saw patient's mother in waiting room outside of [facility] while on my way to see another patient.    She reports that she "has never been treated so poorly as we were this hospitalization" and that "they got what they wanted... he is going to die." She expressed great frustration at the events of the last few days and noted again her belief that he did not receive adequate care and that this will be the cause of Jeffery [name]'s death.  I attempted to explore with her the fact that [name] has been chronically ill for a long time and had developed multisystem organ failure, however, she was not in a place emotionally where she was able to participate in this conversation. I continued with supportive listening.  She reported that I should, "let them know the lawsuit is coming."   She stated that she wanted to meet with Dr. Nelda Marseille and then she is "turning off the ventilator myself and crawling into bed with him while he dies."  I asked if she would like for me to be part of that conversation or process and she declined.  I asked her to call if she felt that our team could be of further assistance.   I spoke with [facility] unit Director to let her know of conversation and Jeffery [name]'s request to, "let them know the lawsuit is coming."  She reports that she will contact Risk Management to ensure they are apprised of the situation.  Please let me know if we can be of further assistance in Jeffery [name]'s care moving forward.  Micheline Rough, MD [place] Team [phone]

## 2015-02-02 NOTE — Progress Notes (Signed)
Recent events noted. Agree with supportive care. We will be available if needed.

## 2015-02-02 NOTE — Progress Notes (Signed)
Chaplain responded the the request of the Nursing Unit to provide pastoral support for the family of patient who has just died.  Upon arrival to the Unit it was reported that the mother didn't want anyone in the room with the patient at this time.  The staff was informed to call the Chaplain if the Mother changes her mind for any Chaplain needs.    Chaplain returned to the Unit for follow-up but was informed the family had left the hospital and there was no longer any need for the Chaplain support at this time for this family. Chaplain Yaakov Guthrie [phone]

## 2015-02-02 NOTE — Progress Notes (Signed)
MD Byrum notified RN that patients central line is okay to use

## 2015-02-02 NOTE — Progress Notes (Signed)
Mother standing in the hallway by front desk nursing station. Mother requested by myself to enter into patients room due to the unit rule of not allowing family and visitors to stand in the hallway to protect all patients privacy. Mother stated she would not go into her son's room until she talked to Dr Ellin Goodie in person and was awaiting him. She then attempted to stand in front of the OR doors and I educated her that she would not be able to stand there either due to safety of doors hitting her and patient privacy issues as well. She still refused to enter the patients room stating she wanted dr and her husband was in the room with the patient. Bethany RN came into conversation explaining where Dr Nelda Marseille was and showed her where the waiting room was and we would get her once the Dr arrived on the unit.

## 2015-02-02 DEATH — deceased

## 2015-02-04 ENCOUNTER — Telehealth: Payer: Self-pay | Admitting: Neurology

## 2015-02-04 NOTE — Telephone Encounter (Signed)
I left a message for mother to call. 

## 2015-02-04 NOTE — Telephone Encounter (Signed)
I called and left a message for mother to return the call.

## 2015-02-04 NOTE — Telephone Encounter (Signed)
I have called patient's mother [name] per requested from [place], fail to reach her at both numbers [phone] and [phone].  I left message.

## 2015-02-04 NOTE — Telephone Encounter (Signed)
I called mother last night and left a message that I would call back today.  I reviewed the chart of his hospitalization. I'm aware of the issues involved in his care.

## 2015-02-05 ENCOUNTER — Telehealth: Payer: Self-pay

## 2015-02-05 NOTE — Telephone Encounter (Addendum)
Pt's mother returned Dr. Greer Pickerel call.   I have left message second time.

## 2015-02-05 NOTE — Telephone Encounter (Signed)
On 02/05/2015 I received a death certificate from [employer] (original). The death certificate is for burial. The patient is a patient of Doctor Nelda Marseille. The death certificate will be taken to [facility] ([address]) this pm for Nelda Marseille to sign the death certificate this evening. On 2015-02-21 I received the death certificate back from Doctor Nelda Marseille. I got the death certificate ready and called the Health dept to let them know it was ready for pickup.

## 2015-02-07 NOTE — Telephone Encounter (Signed)
Mother has not responded despite 3 phone calls.  I will await her phone call.

## 2015-02-10 ENCOUNTER — Inpatient Hospital Stay: Payer: Medicaid Other | Admitting: Infectious Diseases

## 2015-03-05 NOTE — Discharge Summary (Signed)
Jeffery [name], Jeffery [name] NO.:  [id number]  MEDICAL RECORD NO.:  [id number]  LOCATION:  3M13C                        FACILITY:  [facility]  PHYSICIAN:  Providence Lanius, MD  DATE OF BIRTH:  [birth year]  DATE OF ADMISSION:  01/10/2015 DATE OF DISCHARGE:  02-17-2015                              DISCHARGE SUMMARY   PREOPERATIVE DIAGNOSIS/CAUSE OF DEATH:  Acute on chronic hypoxic respiratory failure.  SECONDARY DIAGNOSES: 1. Aspiration pneumonia. 2. Cerebral palsy. 3. Worsened acute encephalopathy. 4. Septic shock. 5. Acute renal failure. 6. Diarrhea. 7. Thrombocytopenia. 8. Anemia. 9. Neutropenia. 10.Pelvic osteomyelitis with sacral decubitus. 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 12.Seizure disorder.  HOSPITAL COURSE:  The patient was a 25 year old male with history of cerebral palsy with multiple associated complications.  He presented to the hospital with acute on chronic shortness of breath.  He was BiPAP dependent at home.  The patient was urgently DNR.  However, in the middle of the night, the patient's respiratory status continued to deteriorate after presenting with an aspiration pneumonia __________ hospitalization.  The mother changed the code status to full code, and the patient was transferred to intensive care unit and intubated.  On the day of the patient's passing, upon his evaluation, his blood pressure was no longer responding to pressors.  The patient was maximized on multiple pressors, however, was unable to compensate.  The patient this morning was on __________ 40 mcg, and heart rate was in the 140s __________ please see detailed events of the patient's expiration day.  However, upon calling the mother __________ the patient was decompensated and became asystolic; and given the fact that the patient was limited code with intubation only, no CPR was attempted.  Father was available bedside, but the mother was not there __________ again please see details  __________ chart regarding the events leading to the patient's death.     Providence Lanius, MD    [id number]/[id number]  D:  02/26/2015  T:  02/27/2015  Job:  [id number]

## 2015-03-24 ENCOUNTER — Encounter: Payer: Medicaid Other | Admitting: Pediatrics

## 2015-03-25 ENCOUNTER — Encounter: Payer: Medicaid Other | Admitting: Pediatrics

## 2015-04-07 ENCOUNTER — Other Ambulatory Visit (HOSPITAL_COMMUNITY): Payer: Medicaid Other

## 2015-07-23 ENCOUNTER — Other Ambulatory Visit: Payer: Self-pay | Admitting: Nurse Practitioner

## 2015-09-08 NOTE — Telephone Encounter (Signed)
Closing encounter

## 2015-09-29 IMAGING — CR DG ABD PORTABLE 1V
2 series · 2 of 2 positions shown · non-contrast
Comparison: None.

CLINICAL DATA: Abdominal distention

EXAM:
PORTABLE ABDOMEN - 1 VIEW

[AP (1 of 2)]
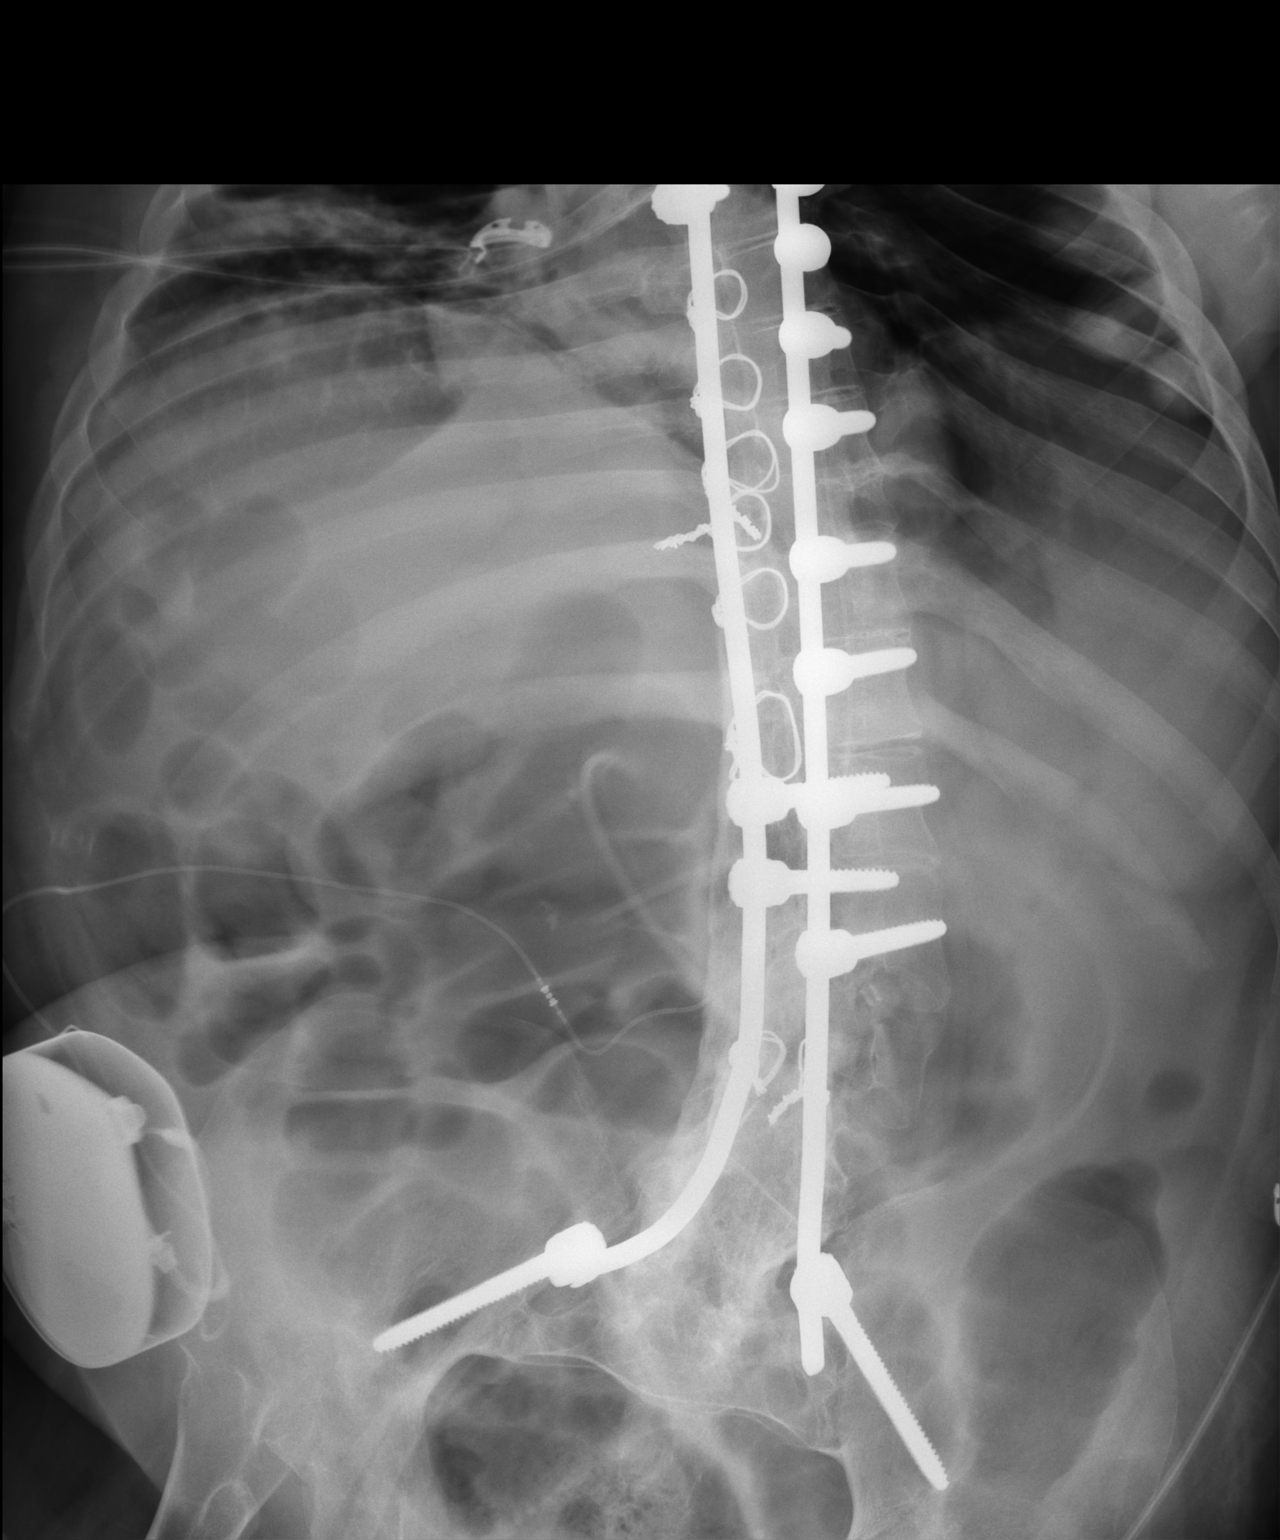

[AP (2 of 2)]
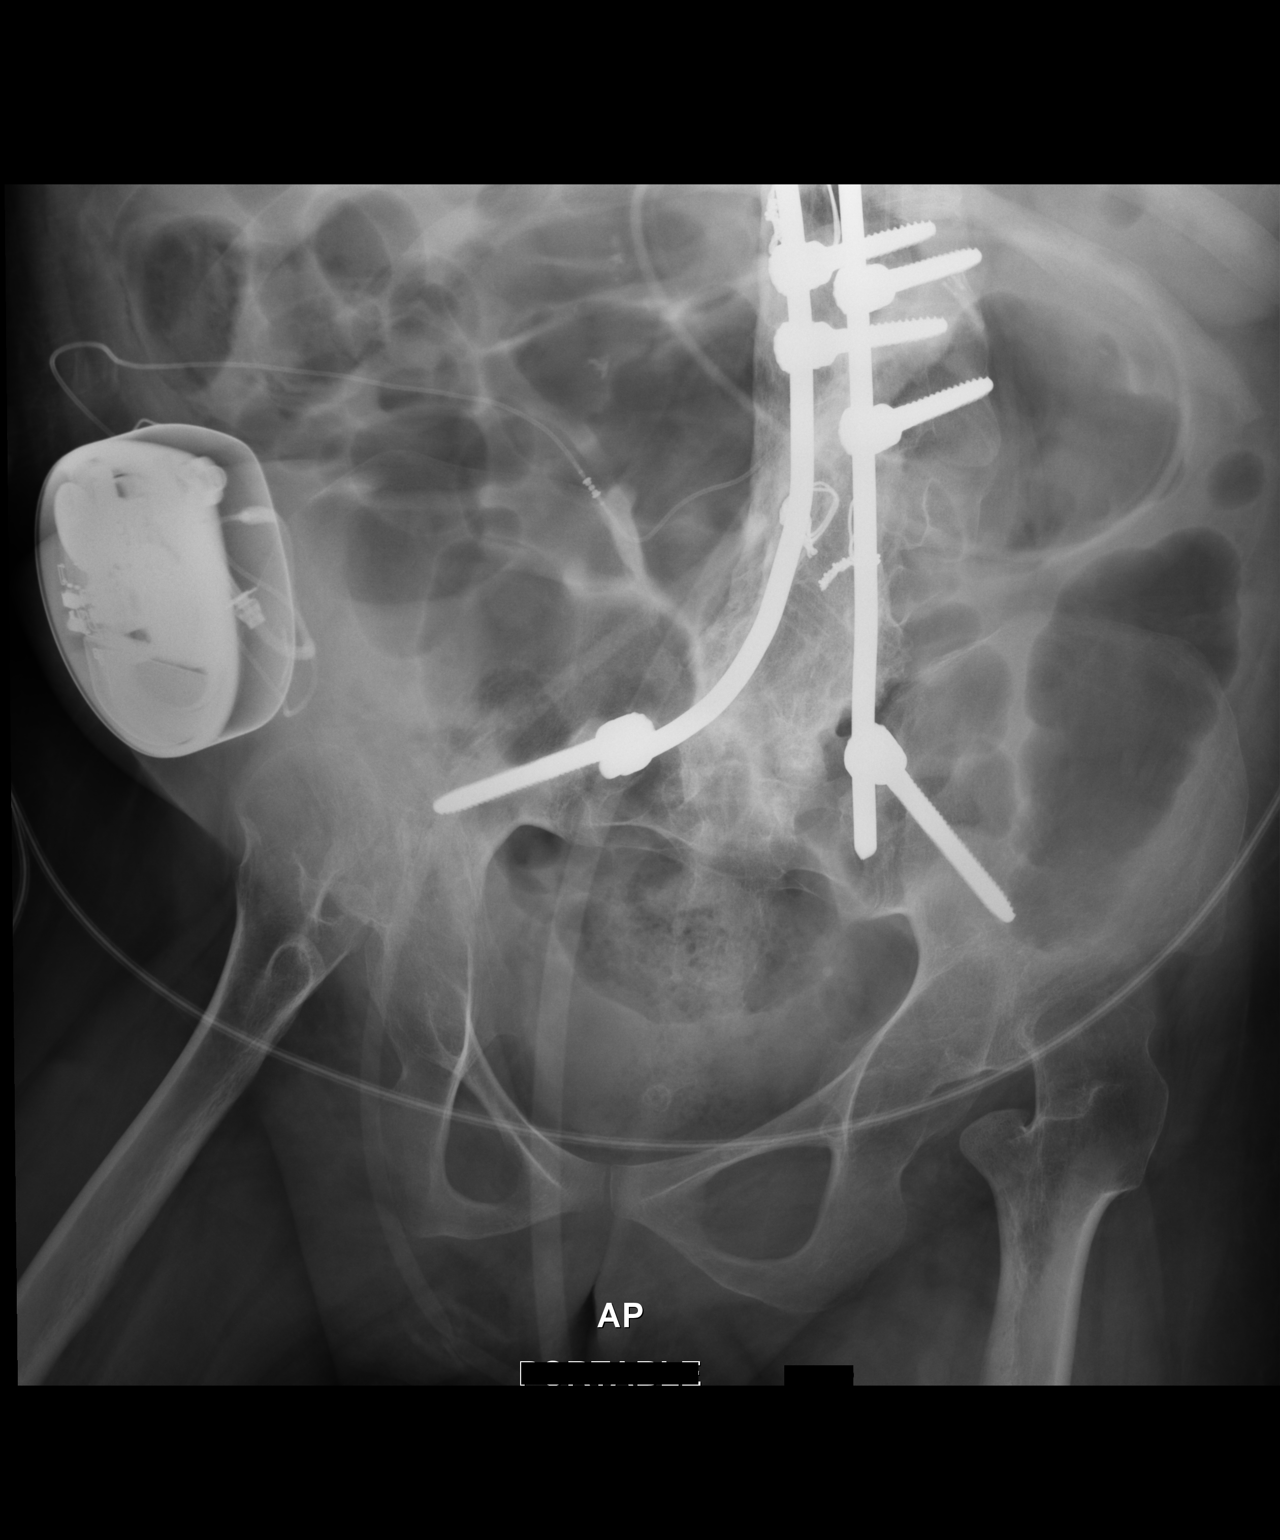

[2 of 2 positions shown; findings below may reference images not displayed]

FINDINGS: Medication pump projects over the right lower quadrant. [place]jax
fusion rods. Bony deformity of the thorax, spine, and pelvis
reidentified. Mild gaseous distention of multiple loops of bowel
over the abdomen noted diffusely, not significantly changed. Linear
right lower lobe scarring or atelectasis. Presence or absence of
air-fluid levels or free air is suboptimally evaluated on this
supine projection. Feeding tube suboptimally visualized over the mid
abdomen.
IMPRESSION: Mild gaseous distention of multiple loops of bowel over the mid
abdomen which is nonspecific but could represent ileus.

## 2015-09-30 IMAGING — CR DG CHEST 1V PORT
1 series · 1 of 1 positions shown · non-contrast
Comparison: 07/20/2014

CLINICAL DATA: Hypoxia.

EXAM:
PORTABLE CHEST - 1 VIEW

[AP]
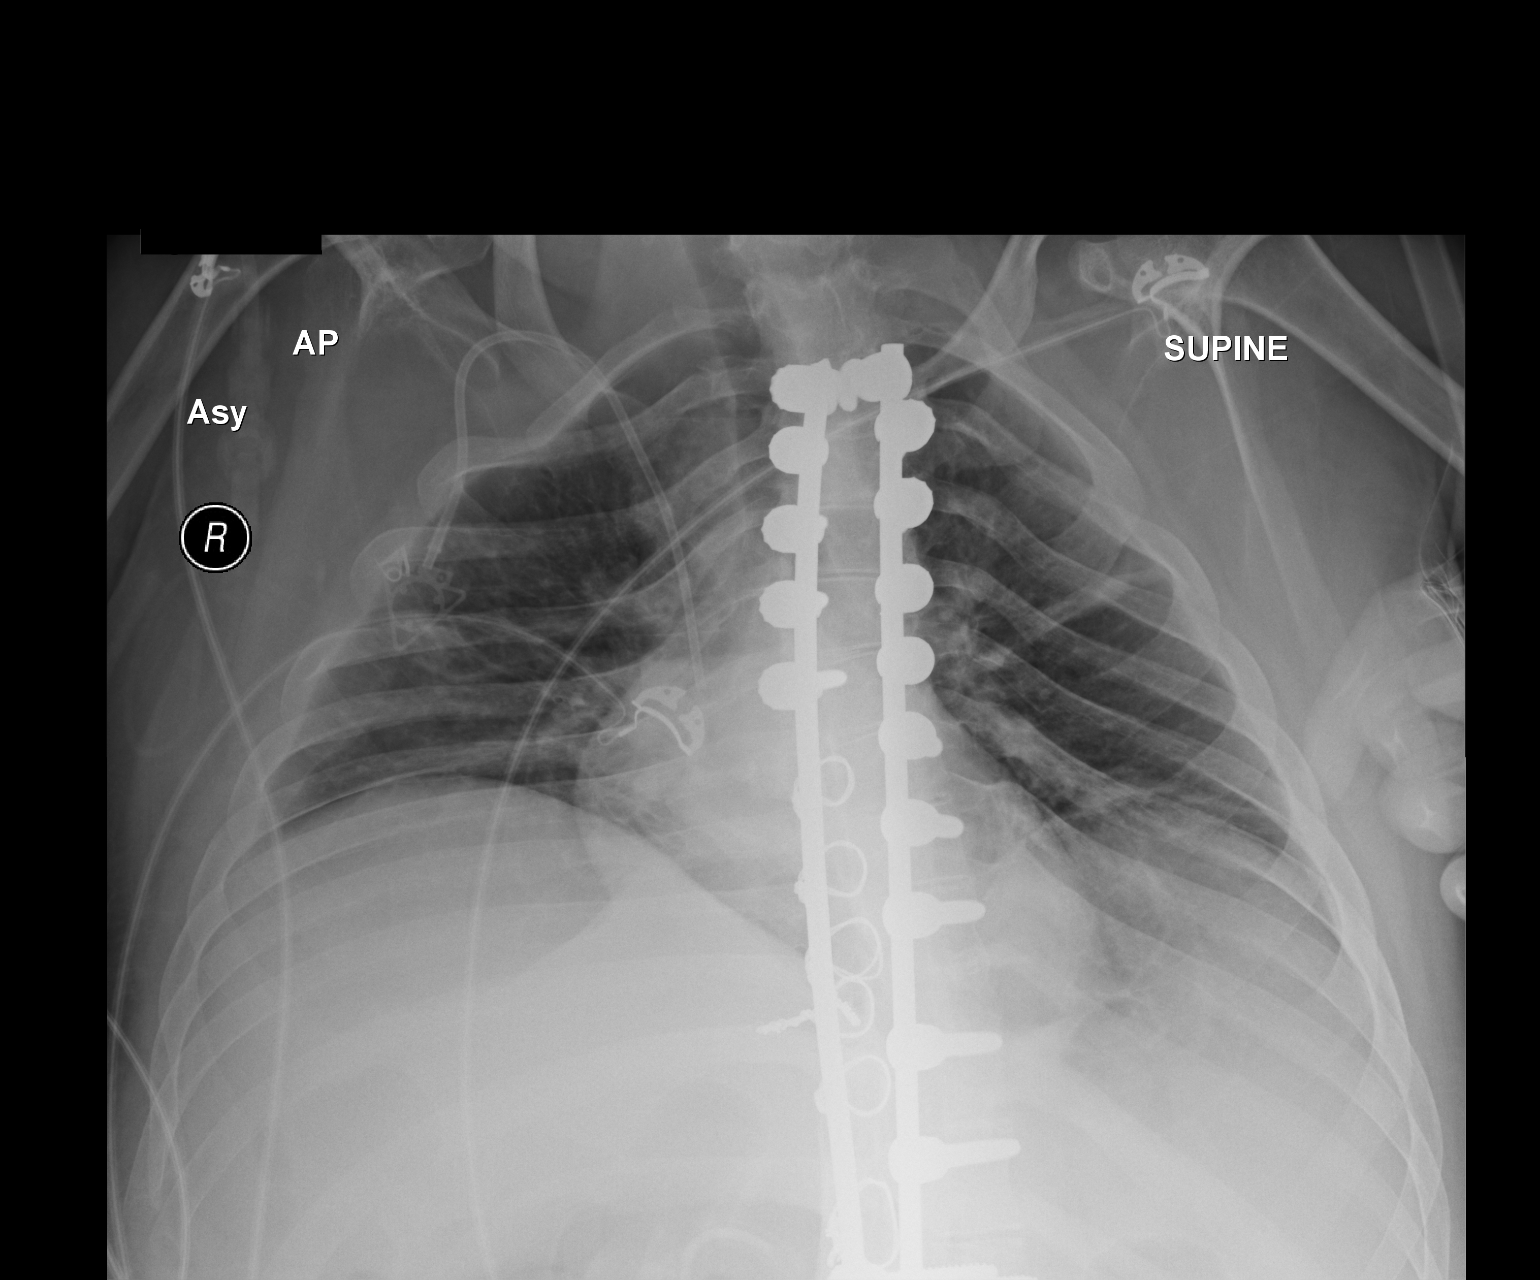

[1 of 1 positions shown; findings below may reference images not displayed]

FINDINGS: Right-sided Port-A-Cath remains in place with tip overlying the
lower SVC. Cardiac silhouette is within normal limits for size.
There is improved aeration of the right lower lung with minimal
residual opacity in the right midlung, likely subsegmental
atelectasis. Mild left basilar opacity persists, also most
suggestive of atelectasis. No sizable pleural effusion or
pneumothorax is identified. Posterior spinal fusion rods are again
noted.
IMPRESSION: Improved aeration of the right lung base. Mild left basilar
atelectasis.

## 2015-10-03 IMAGING — CR DG CHEST 1V PORT
2 series · 2 of 2 positions shown · non-contrast
Comparison: 07/22/2014

CLINICAL DATA: Pulmonary edema

EXAM:
PORTABLE CHEST - 1 VIEW

[AP (1 of 2)]
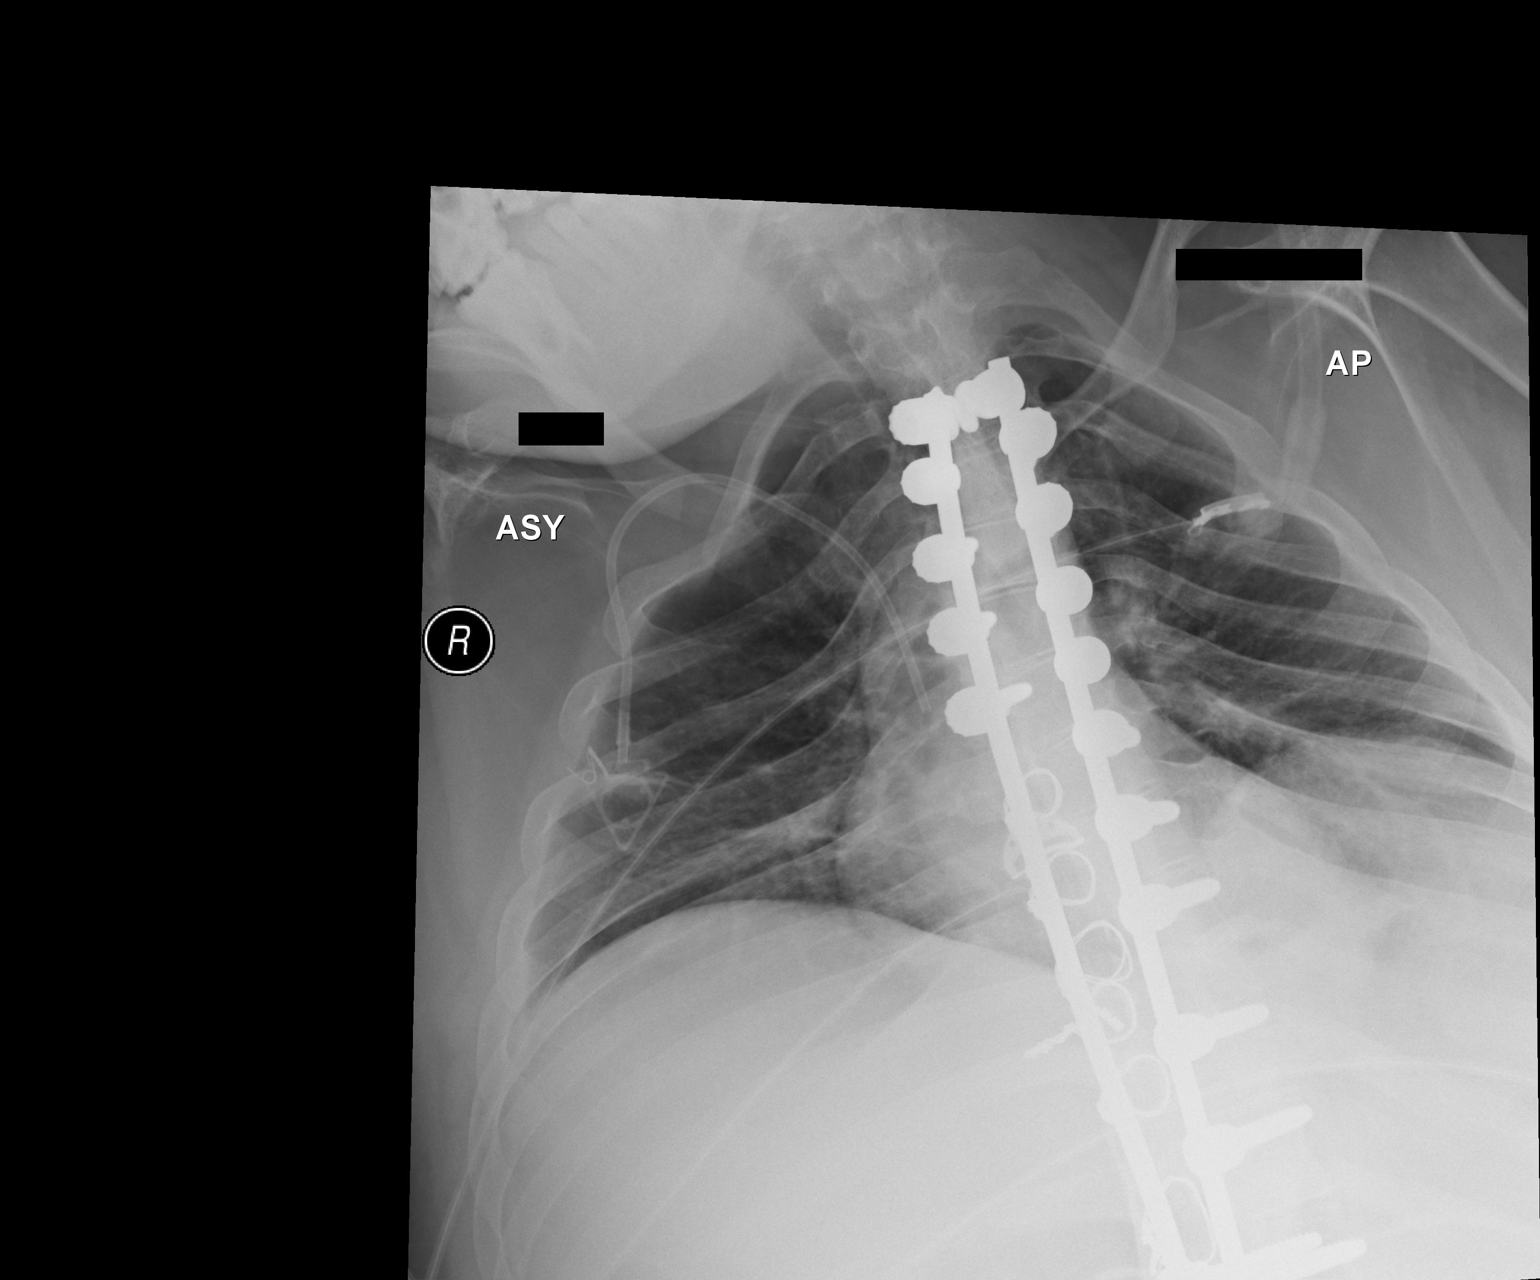

[AP (2 of 2)]
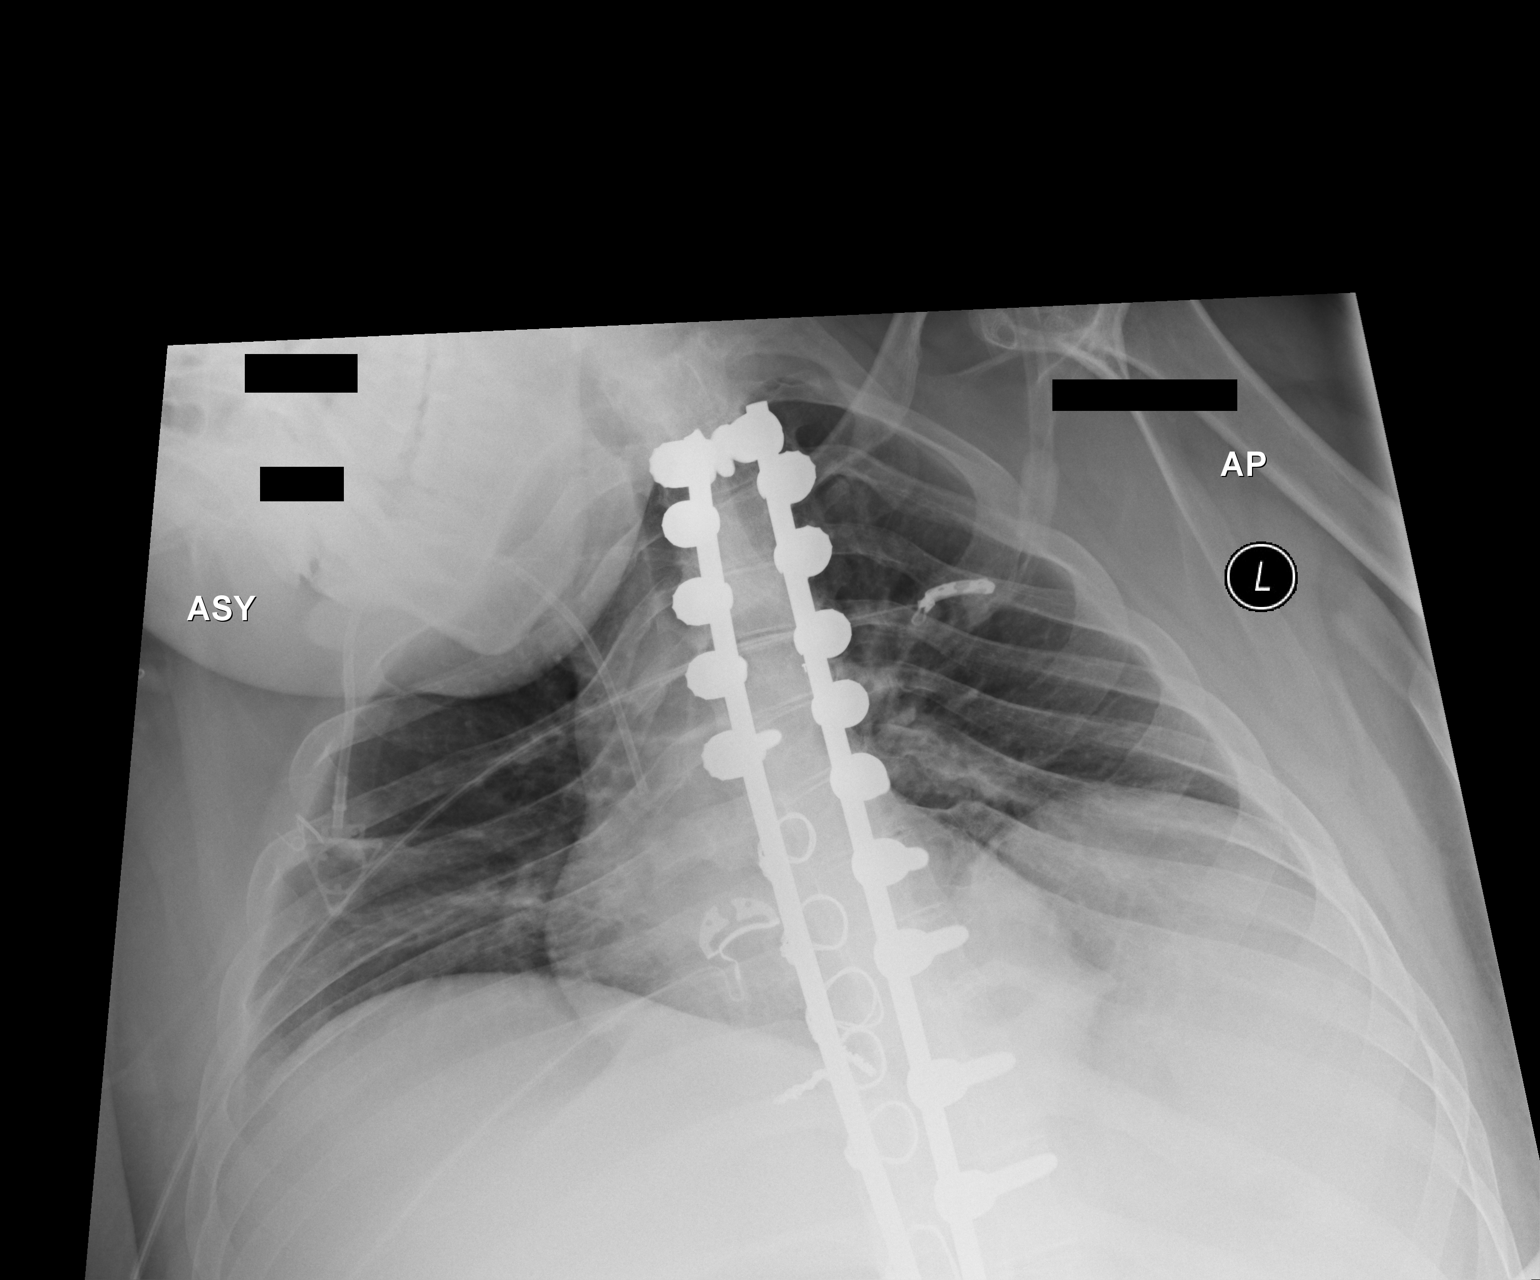

[2 of 2 positions shown; findings below may reference images not displayed]

FINDINGS: Cardiomediastinal silhouette is stable. Metallic fixation rods
thoracolumbar spine again noted. No acute infiltrate or pulmonary
edema. Persistent mild left basilar atelectasis. Right IJ
Port-A-Cath is unchanged in position.
IMPRESSION: No segmental infiltrate or pulmonary edema. Persistent mild left
basilar atelectasis.
